# Patient Record
Sex: Female | Born: 1960 | Race: Black or African American | Hispanic: No | Marital: Married | Smoking: Never smoker
Health system: Southern US, Community
[De-identification: ages and names within clinical notes are randomized; demographics above are authoritative.]

## PROBLEM LIST (undated history)

## (undated) DIAGNOSIS — G4730 Sleep apnea, unspecified: Secondary | ICD-10-CM

## (undated) DIAGNOSIS — N184 Chronic kidney disease, stage 4 (severe): Secondary | ICD-10-CM

## (undated) DIAGNOSIS — J189 Pneumonia, unspecified organism: Secondary | ICD-10-CM

## (undated) DIAGNOSIS — E785 Hyperlipidemia, unspecified: Secondary | ICD-10-CM

## (undated) DIAGNOSIS — E1142 Type 2 diabetes mellitus with diabetic polyneuropathy: Secondary | ICD-10-CM

## (undated) DIAGNOSIS — R0609 Other forms of dyspnea: Secondary | ICD-10-CM

## (undated) DIAGNOSIS — E669 Obesity, unspecified: Secondary | ICD-10-CM

## (undated) DIAGNOSIS — Z8619 Personal history of other infectious and parasitic diseases: Secondary | ICD-10-CM

## (undated) DIAGNOSIS — M79673 Pain in unspecified foot: Secondary | ICD-10-CM

## (undated) DIAGNOSIS — E039 Hypothyroidism, unspecified: Secondary | ICD-10-CM

## (undated) DIAGNOSIS — R0600 Dyspnea, unspecified: Secondary | ICD-10-CM

## (undated) DIAGNOSIS — I739 Peripheral vascular disease, unspecified: Secondary | ICD-10-CM

## (undated) DIAGNOSIS — E119 Type 2 diabetes mellitus without complications: Secondary | ICD-10-CM

## (undated) DIAGNOSIS — R9430 Abnormal result of cardiovascular function study, unspecified: Secondary | ICD-10-CM

## (undated) DIAGNOSIS — T8610 Unspecified complication of kidney transplant: Secondary | ICD-10-CM

## (undated) DIAGNOSIS — K219 Gastro-esophageal reflux disease without esophagitis: Secondary | ICD-10-CM

## (undated) DIAGNOSIS — I10 Essential (primary) hypertension: Secondary | ICD-10-CM

## (undated) DIAGNOSIS — G629 Polyneuropathy, unspecified: Secondary | ICD-10-CM

## (undated) DIAGNOSIS — G43909 Migraine, unspecified, not intractable, without status migrainosus: Secondary | ICD-10-CM

## (undated) DIAGNOSIS — B029 Zoster without complications: Secondary | ICD-10-CM

## (undated) DIAGNOSIS — D649 Anemia, unspecified: Secondary | ICD-10-CM

## (undated) DIAGNOSIS — M1990 Unspecified osteoarthritis, unspecified site: Secondary | ICD-10-CM

## (undated) DIAGNOSIS — M109 Gout, unspecified: Secondary | ICD-10-CM

## (undated) DIAGNOSIS — Z992 Dependence on renal dialysis: Secondary | ICD-10-CM

## (undated) HISTORY — PX: COLONOSCOPY: SHX174

## (undated) HISTORY — DX: Type 2 diabetes mellitus without complications: E11.9

## (undated) HISTORY — DX: Zoster without complications: B02.9

## (undated) HISTORY — DX: Pain in unspecified foot: M79.673

## (undated) HISTORY — DX: Obesity, unspecified: E66.9

## (undated) HISTORY — DX: Hyperlipidemia, unspecified: E78.5

## (undated) HISTORY — DX: Anemia, unspecified: D64.9

## (undated) HISTORY — DX: Essential (primary) hypertension: I10

## (undated) HISTORY — DX: Dyspnea, unspecified: R06.00

## (undated) HISTORY — DX: Other forms of dyspnea: R06.09

## (undated) HISTORY — DX: Abnormal result of cardiovascular function study, unspecified: R94.30

## (undated) HISTORY — DX: Hypothyroidism, unspecified: E03.9

## (undated) HISTORY — DX: Chronic kidney disease, stage 4 (severe): N18.4

---

## 1979-06-04 HISTORY — PX: ENDOMETRIAL CRYOABLATION: SHX1499

## 1985-10-03 HISTORY — PX: TUBAL LIGATION: SHX77

## 1995-10-04 HISTORY — PX: VARICOSE VEIN SURGERY: SHX832

## 1998-10-03 HISTORY — PX: AV FISTULA PLACEMENT: SHX1204

## 1998-10-10 ENCOUNTER — Inpatient Hospital Stay (HOSPITAL_COMMUNITY): Admission: EM | Admit: 1998-10-10 | Discharge: 1998-10-13 | Payer: Self-pay | Admitting: Emergency Medicine

## 1998-10-11 ENCOUNTER — Encounter: Payer: Self-pay | Admitting: Family Medicine

## 1998-10-12 ENCOUNTER — Encounter: Payer: Self-pay | Admitting: Family Medicine

## 1999-11-16 ENCOUNTER — Other Ambulatory Visit: Admission: RE | Admit: 1999-11-16 | Discharge: 1999-11-16 | Payer: Self-pay | Admitting: Obstetrics and Gynecology

## 1999-11-16 ENCOUNTER — Encounter (INDEPENDENT_AMBULATORY_CARE_PROVIDER_SITE_OTHER): Payer: Self-pay | Admitting: Specialist

## 2000-01-28 ENCOUNTER — Emergency Department (HOSPITAL_COMMUNITY): Admission: EM | Admit: 2000-01-28 | Discharge: 2000-01-29 | Payer: Self-pay | Admitting: Emergency Medicine

## 2000-05-10 ENCOUNTER — Other Ambulatory Visit: Admission: RE | Admit: 2000-05-10 | Discharge: 2000-05-10 | Payer: Self-pay | Admitting: Obstetrics and Gynecology

## 2000-06-10 ENCOUNTER — Emergency Department (HOSPITAL_COMMUNITY): Admission: EM | Admit: 2000-06-10 | Discharge: 2000-06-10 | Payer: Self-pay | Admitting: Emergency Medicine

## 2000-11-02 ENCOUNTER — Other Ambulatory Visit: Admission: RE | Admit: 2000-11-02 | Discharge: 2000-11-02 | Payer: Self-pay | Admitting: Orthopedic Surgery

## 2000-11-28 ENCOUNTER — Other Ambulatory Visit: Admission: RE | Admit: 2000-11-28 | Discharge: 2000-11-28 | Payer: Self-pay | Admitting: Obstetrics and Gynecology

## 2001-05-16 ENCOUNTER — Other Ambulatory Visit: Admission: RE | Admit: 2001-05-16 | Discharge: 2001-05-16 | Payer: Self-pay | Admitting: Obstetrics and Gynecology

## 2001-11-12 ENCOUNTER — Ambulatory Visit (HOSPITAL_COMMUNITY): Admission: RE | Admit: 2001-11-12 | Discharge: 2001-11-12 | Payer: Self-pay | Admitting: Gastroenterology

## 2001-11-12 ENCOUNTER — Encounter (INDEPENDENT_AMBULATORY_CARE_PROVIDER_SITE_OTHER): Payer: Self-pay

## 2001-12-06 ENCOUNTER — Other Ambulatory Visit: Admission: RE | Admit: 2001-12-06 | Discharge: 2001-12-06 | Payer: Self-pay | Admitting: Obstetrics and Gynecology

## 2002-01-04 ENCOUNTER — Ambulatory Visit (HOSPITAL_COMMUNITY): Admission: RE | Admit: 2002-01-04 | Discharge: 2002-01-04 | Payer: Self-pay | Admitting: Obstetrics and Gynecology

## 2002-04-27 ENCOUNTER — Encounter: Payer: Self-pay | Admitting: Emergency Medicine

## 2002-04-27 ENCOUNTER — Emergency Department (HOSPITAL_COMMUNITY): Admission: EM | Admit: 2002-04-27 | Discharge: 2002-04-27 | Payer: Self-pay | Admitting: Emergency Medicine

## 2002-05-01 ENCOUNTER — Ambulatory Visit (HOSPITAL_COMMUNITY): Admission: RE | Admit: 2002-05-01 | Discharge: 2002-05-01 | Payer: Self-pay | Admitting: Family Medicine

## 2002-05-01 ENCOUNTER — Encounter: Payer: Self-pay | Admitting: Family Medicine

## 2002-05-12 ENCOUNTER — Encounter: Payer: Self-pay | Admitting: Sports Medicine

## 2002-05-12 ENCOUNTER — Encounter: Admission: RE | Admit: 2002-05-12 | Discharge: 2002-05-12 | Payer: Self-pay | Admitting: Sports Medicine

## 2002-11-09 ENCOUNTER — Emergency Department (HOSPITAL_COMMUNITY): Admission: EM | Admit: 2002-11-09 | Discharge: 2002-11-09 | Payer: Self-pay | Admitting: Emergency Medicine

## 2002-12-30 ENCOUNTER — Emergency Department (HOSPITAL_COMMUNITY): Admission: EM | Admit: 2002-12-30 | Discharge: 2002-12-30 | Payer: Self-pay | Admitting: Emergency Medicine

## 2003-01-30 ENCOUNTER — Other Ambulatory Visit: Admission: RE | Admit: 2003-01-30 | Discharge: 2003-01-30 | Payer: Self-pay | Admitting: Obstetrics and Gynecology

## 2004-06-19 ENCOUNTER — Emergency Department (HOSPITAL_COMMUNITY): Admission: EM | Admit: 2004-06-19 | Discharge: 2004-06-19 | Payer: Self-pay | Admitting: Emergency Medicine

## 2004-11-15 ENCOUNTER — Other Ambulatory Visit: Admission: RE | Admit: 2004-11-15 | Discharge: 2004-11-15 | Payer: Self-pay | Admitting: Obstetrics and Gynecology

## 2005-08-15 ENCOUNTER — Emergency Department (HOSPITAL_COMMUNITY): Admission: EM | Admit: 2005-08-15 | Discharge: 2005-08-15 | Payer: Self-pay | Admitting: Emergency Medicine

## 2007-09-01 ENCOUNTER — Emergency Department (HOSPITAL_COMMUNITY): Admission: EM | Admit: 2007-09-01 | Discharge: 2007-09-01 | Payer: Self-pay | Admitting: *Deleted

## 2008-03-21 ENCOUNTER — Encounter: Admission: RE | Admit: 2008-03-21 | Discharge: 2008-03-21 | Payer: Self-pay | Admitting: Nephrology

## 2008-10-03 HISTORY — PX: AV FISTULA PLACEMENT, RADIOCEPHALIC: SHX1208

## 2008-12-16 ENCOUNTER — Ambulatory Visit (HOSPITAL_COMMUNITY): Admission: RE | Admit: 2008-12-16 | Discharge: 2008-12-16 | Payer: Self-pay | Admitting: Nephrology

## 2008-12-16 ENCOUNTER — Encounter (INDEPENDENT_AMBULATORY_CARE_PROVIDER_SITE_OTHER): Payer: Self-pay | Admitting: Nephrology

## 2008-12-16 ENCOUNTER — Ambulatory Visit: Payer: Self-pay | Admitting: Surgery

## 2009-04-23 ENCOUNTER — Ambulatory Visit: Payer: Self-pay | Admitting: *Deleted

## 2009-04-30 ENCOUNTER — Ambulatory Visit (HOSPITAL_COMMUNITY): Admission: RE | Admit: 2009-04-30 | Discharge: 2009-04-30 | Payer: Self-pay | Admitting: Vascular Surgery

## 2009-04-30 ENCOUNTER — Ambulatory Visit: Payer: Self-pay | Admitting: Vascular Surgery

## 2009-06-29 ENCOUNTER — Encounter: Payer: Self-pay | Admitting: Cardiology

## 2009-09-22 ENCOUNTER — Encounter (INDEPENDENT_AMBULATORY_CARE_PROVIDER_SITE_OTHER): Payer: Self-pay | Admitting: Surgery

## 2009-09-22 ENCOUNTER — Ambulatory Visit (HOSPITAL_COMMUNITY): Admission: RE | Admit: 2009-09-22 | Discharge: 2009-09-23 | Payer: Self-pay | Admitting: Surgery

## 2009-10-03 HISTORY — PX: CHOLECYSTECTOMY: SHX55

## 2009-10-03 HISTORY — PX: CATARACT EXTRACTION W/ INTRAOCULAR LENS  IMPLANT, BILATERAL: SHX1307

## 2009-10-03 HISTORY — PX: INTRAUTERINE DEVICE (IUD) INSERTION: SHX5877

## 2009-11-06 ENCOUNTER — Encounter: Payer: Self-pay | Admitting: Cardiology

## 2009-11-20 ENCOUNTER — Ambulatory Visit: Payer: Self-pay | Admitting: Cardiology

## 2009-11-20 DIAGNOSIS — R9430 Abnormal result of cardiovascular function study, unspecified: Secondary | ICD-10-CM | POA: Insufficient documentation

## 2009-11-20 DIAGNOSIS — R0989 Other specified symptoms and signs involving the circulatory and respiratory systems: Secondary | ICD-10-CM | POA: Insufficient documentation

## 2009-11-20 DIAGNOSIS — N184 Chronic kidney disease, stage 4 (severe): Secondary | ICD-10-CM | POA: Insufficient documentation

## 2009-11-20 DIAGNOSIS — I10 Essential (primary) hypertension: Secondary | ICD-10-CM | POA: Insufficient documentation

## 2009-11-20 DIAGNOSIS — R0609 Other forms of dyspnea: Secondary | ICD-10-CM | POA: Insufficient documentation

## 2009-11-26 ENCOUNTER — Ambulatory Visit: Payer: Self-pay | Admitting: Cardiology

## 2009-11-26 ENCOUNTER — Inpatient Hospital Stay (HOSPITAL_COMMUNITY): Admission: AD | Admit: 2009-11-26 | Discharge: 2009-11-28 | Payer: Self-pay | Admitting: Cardiology

## 2009-12-21 DIAGNOSIS — R0602 Shortness of breath: Secondary | ICD-10-CM | POA: Insufficient documentation

## 2009-12-21 DIAGNOSIS — E039 Hypothyroidism, unspecified: Secondary | ICD-10-CM | POA: Insufficient documentation

## 2009-12-21 DIAGNOSIS — E669 Obesity, unspecified: Secondary | ICD-10-CM | POA: Insufficient documentation

## 2009-12-21 DIAGNOSIS — E1129 Type 2 diabetes mellitus with other diabetic kidney complication: Secondary | ICD-10-CM | POA: Insufficient documentation

## 2009-12-21 DIAGNOSIS — E785 Hyperlipidemia, unspecified: Secondary | ICD-10-CM | POA: Insufficient documentation

## 2009-12-21 DIAGNOSIS — Z87898 Personal history of other specified conditions: Secondary | ICD-10-CM | POA: Insufficient documentation

## 2009-12-21 DIAGNOSIS — B029 Zoster without complications: Secondary | ICD-10-CM | POA: Insufficient documentation

## 2009-12-31 ENCOUNTER — Encounter (INDEPENDENT_AMBULATORY_CARE_PROVIDER_SITE_OTHER): Payer: Self-pay | Admitting: *Deleted

## 2010-07-29 ENCOUNTER — Ambulatory Visit: Payer: Self-pay | Admitting: Oncology

## 2010-08-03 LAB — CBC WITH DIFFERENTIAL/PLATELET
BASO%: 0.4 % (ref 0.0–2.0)
Basophils Absolute: 0.1 10*3/uL (ref 0.0–0.1)
EOS%: 1.7 % (ref 0.0–7.0)
Eosinophils Absolute: 0.2 10*3/uL (ref 0.0–0.5)
HCT: 30.7 % — ABNORMAL LOW (ref 34.8–46.6)
HGB: 10.5 g/dL — ABNORMAL LOW (ref 11.6–15.9)
LYMPH%: 19 % (ref 14.0–49.7)
MCH: 28.9 pg (ref 25.1–34.0)
MCHC: 34.2 g/dL (ref 31.5–36.0)
MCV: 84.6 fL (ref 79.5–101.0)
MONO#: 0.4 10*3/uL (ref 0.1–0.9)
MONO%: 3.4 % (ref 0.0–14.0)
NEUT#: 9.8 10*3/uL — ABNORMAL HIGH (ref 1.5–6.5)
NEUT%: 75.5 % (ref 38.4–76.8)
Platelets: 277 10*3/uL (ref 145–400)
RBC: 3.63 10*6/uL — ABNORMAL LOW (ref 3.70–5.45)
RDW: 13.4 % (ref 11.2–14.5)
WBC: 13 10*3/uL — ABNORMAL HIGH (ref 3.9–10.3)
lymph#: 2.5 10*3/uL (ref 0.9–3.3)

## 2010-08-03 LAB — COMPREHENSIVE METABOLIC PANEL
ALT: 11 U/L (ref 0–35)
AST: 11 U/L (ref 0–37)
Albumin: 3.8 g/dL (ref 3.5–5.2)
Alkaline Phosphatase: 98 U/L (ref 39–117)
BUN: 39 mg/dL — ABNORMAL HIGH (ref 6–23)
CO2: 22 mEq/L (ref 19–32)
Calcium: 9.1 mg/dL (ref 8.4–10.5)
Chloride: 106 mEq/L (ref 96–112)
Creatinine, Ser: 3.11 mg/dL — ABNORMAL HIGH (ref 0.40–1.20)
Glucose, Bld: 165 mg/dL — ABNORMAL HIGH (ref 70–99)
Potassium: 4.2 mEq/L (ref 3.5–5.3)
Sodium: 138 mEq/L (ref 135–145)
Total Bilirubin: 0.3 mg/dL (ref 0.3–1.2)
Total Protein: 7.1 g/dL (ref 6.0–8.3)

## 2010-08-03 LAB — LACTATE DEHYDROGENASE: LDH: 142 U/L (ref 94–250)

## 2010-08-03 LAB — TECHNOLOGIST REVIEW

## 2010-08-03 LAB — CHCC SMEAR

## 2010-08-06 LAB — BCR/ABL (LIO MMD)

## 2010-09-23 ENCOUNTER — Ambulatory Visit: Payer: Self-pay | Admitting: Vascular Surgery

## 2010-10-29 NOTE — Assessment & Plan Note (Signed)
OFFICE VISIT  Wanda [name], Wanda [name] DOB:  1960-12-22                                       10/28/2010 [id number]  CHIEF COMPLAINT: 1. Foot pain. 2. Nonmaturing right arm arteriovenous fistula.  HISTORY OF PRESENT ILLNESS:  The patient is a 50 year old female referred by Dr. Edrick [name] for evaluation of lower extremity pain.  The patient states she develops a pins and needles sensation in both feet that mainly occur at night time.  She states that this has been going on for approximately 6 months with no real significant change.  She has not really been on any neuropathy meds in the past.  She states that she also has similar findings in her hands, but it is more numbness and tingling in the hands rather than the pins and needles sensation.  She denies any symptoms of claudication.  She has previously had some portion of veins stripped out of her right leg for varicose veins.  She denies any nonhealing ulcerations on her feet.  Additionally, she is referred today for a nonmaturing AV fistula.  She had a right radiocephalic AV fistula placed in July 2010.  Review of the operative note shows that the artery was only 1 mm in diameter and fairly small.  She currently is not on dialysis but she was recently evaluated by Dr. Justin [name] and it was thought that the fistula probably would not be usable due to its small size.  CHRONIC MEDICAL PROBLEMS:  Chronic kidney disease as well as hypertension, hyperlipidemia, and anemia.  These are all currently controlled and followed by Dr. Justin [name].  PAST SURGICAL HISTORY:  AV fistula, cardiac catheterization for transplant evaluation, cholecystectomy, C-section x2, vein stripping.  SOCIAL HISTORY:  She is married, has 3 children.  She works for [employer].  She is a nonsmoker and nonconsumer of alcohol.  FAMILY HISTORY:  Not remarkable for any vascular disease at age less than 26.  REVIEW OF SYSTEMS:   She has had some recent weight gain.  She is 5 feet 9, 263 pounds. VASCULAR:  As mentioned above. URINARY:  She has chronic kidney disease as mentioned above. GI:  She has intermittent problems with constipation. All other systems on her 12-point review of systems were negative.  MEDICATIONS: 1. Lipitor 40 mg once a day. 2. Furosemide 80 mg once a day. 3. Ergocalciferol 50,000 units once a month. 4. Coreg 25 mg twice a day. 5. Synthroid 0.25 mcg once a day. 6. B12 injection once monthly. 7. __________ once daily. 8. Novolin insulin 65 units b.i.[name]. 9. Amlodipine 10 mg q.h.s. 10.Spirolactone 50 mg once a day. 11.MiraLax twice a day. 12.Aspirin 81 mg once a day.  ALLERGIES:  She has allergies listed to Vicodin and Darvocet.  PHYSICAL EXAM:  Blood pressure is 136/86, heart rate is 96 regular. Temperature is 97.9.  HEENT:  Unremarkable.  Neck:  2+ carotid pulses without bruit.  Chest:  Clear to auscultation.  Cardiac exam:  Regular rate and rhythm without murmur.  Abdomen:  Obese, soft, nontender, nondistended.  No masses.  Musculoskeletal exam:  She has no major obvious joint deformities.  Neurologic exam:  She has symmetric upper extremity and lower extremity motor strength which is 5/5.  Sensation is decreased in a glove-stocking distribution bilaterally.  Skin:  No open ulcers or rashes.  Peripheral vascular exam:  She has a 2+ radial pulse bilaterally.  She has 2+ femoral pulse bilaterally.  She has 2+ right posterior tibial pulse.  She has a 1+ left dorsalis pedis pulse.  She has an absent dorsalis pedis pulse on the right.  She has an absent posterior tibial pulse on the left.  She has an easily palpable thrill in the right radiocephalic AV fistula, but this is small in diameter.  She had bilateral ABIs performed today which showed triphasic waveforms and normal ABIs, 1.21 on the left and 0.17 on the right.  She also had a duplex ultrasound of her radiocephalic AV fistula  which showed that the anastomosis was patent.  However, the fistula was still small, 3 mm in diameter throughout most of its course.  In summary, as far as [name]'s leg symptoms are concerned, I agree with Dr. Jason [name] assessment that this seems more like peripheral neuropathy and PAD especially in light of the fact that she has palpable pulses and normal ABIs.  To treat this, I have placed her on Neurontin 100 mg once a day today.  This dose may need to be titrated up over time.  I will leave this at Dr. Jason [name] discretion.  As far as her right radiocephalic AV fistula is concerned, I do not believe this is going to mature long-term and it was a fairly small artery.  I believe the best option at this time would be to revise this to a right brachiocephalic fistula and possible ligation of her radiocephalic fistula if she has any steal symptoms after that.  Her new fistula will be placed on November 03, 2010.  Risks, benefits, possible complications, and procedure details were explained to the patient including but not limited to bleeding, infection, nonmaturation of the fistula, possible requirement of superficialization of the fistula at some point in the future.    Wanda Oto. Zakhai Meisinger, MD Electronically Signed  CEF/MEDQ  [name]:  10/29/2010  T:  10/29/2010  Job:  [id number]  cc:   Wanda [name], M.[name].

## 2010-11-02 NOTE — Cardiovascular Report (Signed)
Summary: Pre Cath Orders  Pre Cath Orders   Imported By: Sallee Provencal 12/01/2009 15:30:46  _____________________________________________________________________  External Attachment:    Type:   Image     Comment:   External Document

## 2010-11-02 NOTE — Assessment & Plan Note (Signed)
Summary: np6/ work up for transplant, abn stress test, eval for cath, ...   Visit Type:  new pt visit Referring Provider:  Edrick Oh, MD  CC:  pt is having kindey transplant says she had an abnormal stress test..edema/feet.  History of Present Illness: 50 yo with history of CKD stage IV, HTN, and diabetes undergoing evaluation for renal transplant at [facility] was found to have evidence for ischemia on pre-transplant stress myoview.  She had a 5% reversible anteroseptal defect and a 5% reversible inferior defect.  She denies any chest pain but does have significant exertional dyspnea.  She is short of breath after walking 50 feet or after climbing a flight of steps.  She is short of breath going up an incline.  No orthopnea or PND.  No episodes of syncope or presyncope.  BP is 172/94 and rechecked 156/96.  She has not taken any of her BP meds yet today.    ECG: NSR, LVH  Labs (9/10): HCT 34.8, K 5.1, creatinine 3.65  Current Medications (verified): 1)  Norvasc 10 Mg Tabs (Amlodipine Besylate) .[name] Kitchen.. 1 Tab Once Daily 2)  Spironolactone 50 Mg Tabs (Spironolactone) .[name] Kitchen.. 1 Tab Once Daily 3)  Synthroid 50 Mcg Tabs (Levothyroxine Sodium) .[name] Kitchen.. 1 Tab Once Daily 4)  Coreg 25 Mg Tabs (Carvedilol) .[name] Kitchen.. 1 Tab Two Times A Day 5)  Lasix 80 Mg Tabs (Furosemide) .[name] Kitchen.. 1 Tab Once Daily 6)  Lipitor 40 Mg Tabs (Atorvastatin Calcium) .[name] Kitchen.. 1 Tab Once Daily 7)  Nph Insulin .[name] Kitchen.. 60u Two Times A Day 8)  Aspir-Low 81 Mg Tbec (Aspirin) .... One Daily 9)  Acetadote 200 Mg/ml Soln (Acetylcysteine) .... Take 1200mg  Twice A Day Starting The Morning of 11-26-09 For 4 Doses  Allergies (verified): 1)  ! Vicodin 2)  ! Darvocet  Past History:  Past Medical History: 1. 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  Being worked up for renal transplant at [facility] in [place]. 2. HTN 3. Hyperlipidemia 4. Type II diabetes x 19 years 5. Hypothyroidism 6. Obesity: used to weigh over 400 lbs, used Fen-phen in the past.  7.  Migraines 8. Exertional dyspnea: Echo (2/11) showed EF 55-60%, normal RV, trivial MR.  No Fen-phen type valvular pathology.  Lexiscan myoview (2/11): EF 49%, small (5%) reversible anteroseptal defect.  Small (5%) reversible inferior defect.   Family History: Father died at 52 from colon cancer.  No premature CAD.   Social History: Married with 2 kids.  Drives schoolbus.  Never smoked, no ETOH.   Review of Systems       All systems reviewed and negative except as per HPI.   Vital Signs:  Patient profile:   50 year old female Height:      70 inches Weight:      257 pounds BMI:     37.01 Pulse rate:   90 / minute Pulse rhythm:   irregular BP sitting:   172 / 94  (left arm) Cuff size:   large  Vitals Entered By: Julaine Hua, CMA (November 20, 2009 10:49 AM)  Physical Exam  General:  Well developed, well nourished, in no acute distress. Obese.  Head:  normocephalic and atraumatic Nose:  no deformity, discharge, inflammation, or lesions Mouth:  Teeth, gums and palate normal. Oral mucosa normal. Neck:  Neck supple, no JVD. No masses, thyromegaly or abnormal cervical nodes. Lungs:  Clear bilaterally to auscultation and percussion. Heart:  Non-displaced PMI, chest non-tender; regular rate and rhythm, S1, S2 without murmurs,  rubs or gallops. Carotid upstroke normal, no bruit. Pedals normal pulses. Trace edema.  Abdomen:  Bowel sounds positive; abdomen soft and non-tender without masses, organomegaly, or hernias noted. No hepatosplenomegaly. Msk:  Back normal, normal gait. Muscle strength and tone normal. Extremities:  No clubbing or cyanosis. Neurologic:  Alert and oriented x 3. Skin:  Intact without lesions or rashes. Psych:  Normal affect.   Impression & Recommendations:  Problem # 1:  NONSPECIFIC ABNORMAL UNSPEC CV FUNCTION STUDY (ICD-794.30) Myoview done pre-transplant at [facility] showed a small reversible anteroseptal defect and a small reversible inferior defect. She does have  risk factors for CAD: long-standing diabetes, HTN, and hyperlipidemia. Severe CKD also puts her at risk for developing CAD.  At worst, she could have multivessel disease based on reversible changes in 2 distributions.  She has CKD stage IV so is at high risk of progression of renal disease with contrast.  However, I think that prior to renal transplant surgery, we need to work out whether or not she has significant CAD.  I have spoken with Dr. Justin Mend regarding this situation and will plan on admitting her for IV sodium bicarbonate and acetylcysteine the evening before catheterization.  We will do a cardiac catheterization attempting to use minimal contrast.  I have informed her of the risk of worsening renal function and need for dialysis.  She will start ASA 81 mg daily.    Problem # 2:  DYSPNEA ON EXERTION (ICD-786.09) Most likely this represents diastolic CHF in the setting of HTN and severe CKD with volume retention.  She is on Lasix 80 mg daily and appears relatively euvolemic on exam.  However, given the abnormal myoview, there must be concern for ischemic diastolic dysfunction.    Problem # 3:  HYPERTENSION, UNSPECIFIED (ICD-401.9) BP is high today but patient has not taken any of her meds.  She will take her BP meds when she gets home.   Other Orders: Cardiac Catheterization (Cardiac Cath)  Patient Instructions: 1)  Your physician has recommended you make the following change in your medication:  2)  Start Aspirin 81mg  daily 3)  Your physician has requested that you have a cardiac catheterization.  Cardiac catheterization is used to diagnose and/or treat various heart conditions. Doctors may recommend this procedure for a number of different reasons. The most common reason is to evaluate chest pain. Chest pain can be a symptom of coronary artery disease (CAD), and cardiac catheterization can show whether plaque is narrowing or blocking your heart's arteries. This procedure is also used to  evaluate the valves, as well as measure the blood flow and oxygen levels in different parts of your heart.  For further information please visit [email or web address][email or web address].  Please follow instruction sheet, as given. Prescriptions: ACETADOTE 200 MG/ML SOLN (ACETYLCYSTEINE) TAKE 1200MG  TWICE A DAY STARTING THE MORNING OF 11-26-09 FOR 4 DOSES  #3 x 0   Entered by:   Desiree Lucy, RN, BSN   Authorized by:   Loralie Champagne, MD   Signed by:   Desiree Lucy, RN, BSN on 11/20/2009   Method used:   Electronically to        [facility] [address] [phone]* (retail)       [address]       [place], [place]  [address]       Ph: [phone] or [phone]       Fax: [phone]   RxID:   [id number]

## 2010-11-02 NOTE — Letter (Signed)
Summary: Cardiac Catheterization Instructions- Main Lab  [facility], [place]  [id number] [place]   [place], [place] [address]   Phone: [phone]  Fax: [phone]     11/20/2009 MRN: [id number]  [facility] [place], [place]  [address]  Dear Ms. MI,   You are scheduled for Cardiac Catheterization on Friday February 25,2011              with Dr. Loralie Champagne .            Please arrive at the [place] [facility] at 6 pm       on the day before your procedure.     MAKE SURE YOU TAKE YOUR ASPIRIN.         YOU MAY TAKE ALL of your  medications .            __x__ Pre-med instructions:            Take Mucomyst 1200mg   starting Thursday morning February 24,2011--Take 1200mg  Thursday morning,Thursday evening, and Friday evening after you have the cathetherization.    Plan for one night stay - bring personal belongings (i.e. toothpaste, toothbrush, etc.)   Bring a current list of your medications and current insurance cards.   Must have a responsible person to drive you home.    Someone must be with you for the first 24 hours after you arrive home.   Please wear clothes that are easy to get on and off and wear slip-on shoes.  *Special note: Every effort is made to have your procedure done on time.  Occasionally there are emergencies that present themselves at the hospital that may cause delays.  Please be patient if a delay does occur.  If you have any questions after you get home, please call the office at the number listed above.  Desiree Lucy, RN, BSN

## 2010-11-02 NOTE — Letter (Signed)
Summary: Appointment - Missed  [facility], [place]  [address] [place]   [place], [place] [address]   Phone: [phone]  Fax: [phone]     December 31, 2009 MRN: [id number]   [facility] [place], [place]  [address]   Dear Ms. Wanda [name],  Our records indicate you missed your appointment on  12/22/2009 with Dr. Aundra Dubin. It is very important that we reach you to reschedule this appointment. We look forward to participating in your health care needs. Please contact us at the number listed above at your earliest convenience to reschedule this appointment.     Sincerely,   Darnell Level [facility] Scheduling Team

## 2010-11-03 ENCOUNTER — Other Ambulatory Visit: Payer: Self-pay | Admitting: Vascular Surgery

## 2010-11-03 ENCOUNTER — Ambulatory Visit (HOSPITAL_COMMUNITY)
Admission: RE | Admit: 2010-11-03 | Discharge: 2010-11-03 | Disposition: A | Discharge status: Home / Self Care | Payer: 59 | Attending: Vascular Surgery | Admitting: Vascular Surgery

## 2010-11-03 ENCOUNTER — Encounter (HOSPITAL_COMMUNITY)
Admission: RE | Admit: 2010-11-03 | Discharge: 2010-11-03 | Disposition: A | Discharge status: Home / Self Care | Payer: 59 | Source: Ambulatory Visit | Attending: Vascular Surgery | Admitting: Vascular Surgery

## 2010-11-03 DIAGNOSIS — I120 Hypertensive chronic kidney disease with stage 5 chronic kidney disease or end stage renal disease: Secondary | ICD-10-CM | POA: Insufficient documentation

## 2010-11-03 DIAGNOSIS — N186 End stage renal disease: Secondary | ICD-10-CM

## 2010-11-03 DIAGNOSIS — Z01818 Encounter for other preprocedural examination: Secondary | ICD-10-CM | POA: Insufficient documentation

## 2010-11-03 DIAGNOSIS — E119 Type 2 diabetes mellitus without complications: Secondary | ICD-10-CM | POA: Insufficient documentation

## 2010-11-03 DIAGNOSIS — Z01811 Encounter for preprocedural respiratory examination: Secondary | ICD-10-CM

## 2010-11-03 DIAGNOSIS — E039 Hypothyroidism, unspecified: Secondary | ICD-10-CM | POA: Insufficient documentation

## 2010-11-03 LAB — POCT I-STAT 4, (NA,K, GLUC, HGB,HCT)
Glucose, Bld: 160 mg/dL — ABNORMAL HIGH (ref 70–99)
HCT: 33 % — ABNORMAL LOW (ref 36.0–46.0)
Hemoglobin: 11.2 g/dL — ABNORMAL LOW (ref 12.0–15.0)
Potassium: 4.2 mEq/L (ref 3.5–5.1)
Sodium: 139 mEq/L (ref 135–145)

## 2010-11-03 LAB — SURGICAL PCR SCREEN
MRSA, PCR: NEGATIVE
Staphylococcus aureus: NEGATIVE

## 2010-11-03 LAB — GLUCOSE, CAPILLARY: Glucose-Capillary: 156 mg/dL — ABNORMAL HIGH (ref 70–99)

## 2010-11-05 NOTE — Procedures (Unsigned)
VASCULAR LAB EXAM  INDICATION:  Nonmaturing AV fistula.  HISTORY: Diabetes:  Yes Cardiac:  No Hypertension:  Yes  EXAM:  Right radiocephalic fistula for dialysis access.  IMPRESSION:  Patent arteriovenous fistula with diameters and velocities as described on attached worksheet.  ___________________________________________ Jessy Oto. Fields, MD  LT/MEDQ  D:  10/28/2010  T:  10/28/2010  Job:  [id number]

## 2010-11-06 NOTE — Op Note (Signed)
  NAMECOTRINA, DANZEY             ACCOUNT NO.:  [id number]  MEDICAL RECORD NO.:  [id number]           PATIENT TYPE:  O  LOCATION:  XRAY                         FACILITY:  [facility]  PHYSICIAN:  Jessy Oto. Lestine Rahe, MD  DATE OF BIRTH:  1961-02-15  DATE OF PROCEDURE:  11/03/2010 DATE OF DISCHARGE:                              OPERATIVE REPORT   PROCEDURE:  Right brachiocephalic arteriovenous fistula.  PREOPERATIVE DIAGNOSIS:  End-stage renal disease.  POSTOPERATIVE DIAGNOSIS:  End-stage renal disease.  ANESTHESIA:  Local with IV sedation.  ASSISTANT:  Leta Baptist, PA-C  OPERATIVE FINDINGS:  [id number] mm right cephalic vein.  OPERATIVE DETAILS:  After obtaining informed consent, the patient was taken to the operating room.  The patient was placed in the supine position on the operating table.  After adequate sedation, the patient's entire right upper extremity was prepped and draped in usual sterile fashion.  Local anesthesia was infiltrated near the right antecubital crease.  Transverse incision was made in this location and carried down through subcutaneous tissues down the level of the cephalic vein. Cephalic vein was of good quality approximately 3-3.5 mm in diameter. This was dissected free circumferentially, and small side branches ligated and divided between silk ties or clips.  Next, brachial artery was dissected free in the medial portion incision.  The brachial artery was fairly small between 2.5-3 mm in diameter, but there was spasm within it.  Small side branches ligated and divided between silk ties or clips.  The patient was given 5000 units of intravenous heparin.  Distal cephalic vein was ligated with 2-0 silk ties.  The vein was then transected and swung over the level of the artery.  The vein was gently distended with heparinized saline.  Longitudinal opening was made in the brachial artery after proximal and distal control was obtained with vessel loops.  Vein  was then sewn end of vein to side of artery using a running 7-0 Prolene suture.  Just prior to completion of anastomosis, there was forebled, backbled, and thoroughly flushed.  Anastomosis was secured.  Vessel loops were released.  There was palpable thrill in the fistula immediately.  Hemostasis was obtained.  Subcutaneous tissues were reapproximated with running 3-0 Vicryl suture.  Skin was closed with 4-0 Vicryl subcuticular stitch.  The patient tolerated the procedure well, and there were no complications.  Instrument, sponge, and needle counts were correct at the end of the case.  The patient was taken to the recovery room in stable condition.     Jessy Oto. Lenell Lama, MD     CEF/MEDQ  D:  11/03/2010  T:  11/04/2010  Job:  [id number]  Electronically Signed by Ruta Hinds MD on 11/05/2010 01:13:53 PM

## 2010-12-02 ENCOUNTER — Ambulatory Visit (INDEPENDENT_AMBULATORY_CARE_PROVIDER_SITE_OTHER): Payer: 59 | Admitting: Vascular Surgery

## 2010-12-02 ENCOUNTER — Ambulatory Visit: Payer: Self-pay | Admitting: Vascular Surgery

## 2010-12-02 DIAGNOSIS — N186 End stage renal disease: Secondary | ICD-10-CM

## 2010-12-03 NOTE — Assessment & Plan Note (Signed)
OFFICE VISIT  Wanda [name], Wanda [name] DOB:  January 16, 1961                                       12/02/2010 [id number]  The patient returns for followup today.  She had a right brachiocephalic AV fistula placed on February 1.  She denies any fever, chills.  She states the wound is well-healed.  She has no signs and symptoms of steal in the right hand, specifically no numbness, tingling or aching in the hand.  PHYSICAL EXAM:  Blood pressure is 132/84 in the left arm, temperature is 98.2, heart rate is 101 and regular.  There is an easily palpable thrill in the right upper arm AV fistula.  The antecubital incision is well- healed.  The right hand is warm and well-perfused.  There is normal neurologic strength in the right hand.  The fistula is fairly deep on palpation.  ASSESSMENT:  Patent right brachiocephalic arteriovenous fistula although fairly deep at this point.  We will have her exercise this for the next 3 months and bring her back for a duplex ultrasound.  If the fistula has not matured or does not have proper depth and quality at that point we will consider superficialization or other intervention to make the fistula usable.    Jessy Oto. Fields, MD Electronically Signed  CEF/MEDQ  D:  12/02/2010  T:  12/03/2010  Job:  [id number]  cc:   Sherril Croon, M.D.

## 2010-12-07 ENCOUNTER — Ambulatory Visit: Payer: Self-pay | Admitting: *Deleted

## 2010-12-22 LAB — CBC
HCT: 35 % — ABNORMAL LOW (ref 36.0–46.0)
Hemoglobin: 12.1 g/dL (ref 12.0–15.0)
MCHC: 34.6 g/dL (ref 30.0–36.0)
MCV: 85.9 fL (ref 78.0–100.0)
Platelets: 237 10*3/uL (ref 150–400)
RBC: 4.07 MIL/uL (ref 3.87–5.11)
RDW: 13.2 % (ref 11.5–15.5)
WBC: 15.5 10*3/uL — ABNORMAL HIGH (ref 4.0–10.5)

## 2010-12-22 LAB — PROTIME-INR
INR: 1.01 (ref 0.00–1.49)
Prothrombin Time: 13.2 seconds (ref 11.6–15.2)

## 2010-12-22 LAB — BASIC METABOLIC PANEL
BUN: 39 mg/dL — ABNORMAL HIGH (ref 6–23)
BUN: 48 mg/dL — ABNORMAL HIGH (ref 6–23)
CO2: 22 mEq/L (ref 19–32)
CO2: 24 mEq/L (ref 19–32)
Calcium: 8.7 mg/dL (ref 8.4–10.5)
Calcium: 9 mg/dL (ref 8.4–10.5)
Chloride: 103 mEq/L (ref 96–112)
Chloride: 104 mEq/L (ref 96–112)
Creatinine, Ser: 2.93 mg/dL — ABNORMAL HIGH (ref 0.4–1.2)
Creatinine, Ser: 2.97 mg/dL — ABNORMAL HIGH (ref 0.4–1.2)
GFR calc Af Amer: 20 mL/min — ABNORMAL LOW (ref >60)
GFR calc Af Amer: 21 mL/min — ABNORMAL LOW (ref >60)
GFR calc non Af Amer: 17 mL/min — ABNORMAL LOW (ref >60)
GFR calc non Af Amer: 17 mL/min — ABNORMAL LOW (ref >60)
Glucose, Bld: 285 mg/dL — ABNORMAL HIGH (ref 70–99)
Glucose, Bld: 319 mg/dL — ABNORMAL HIGH (ref 70–99)
Potassium: 4.5 mEq/L (ref 3.5–5.1)
Potassium: 4.8 mEq/L (ref 3.5–5.1)
Sodium: 134 mEq/L — ABNORMAL LOW (ref 135–145)
Sodium: 135 mEq/L (ref 135–145)

## 2010-12-22 LAB — GLUCOSE, CAPILLARY
Glucose-Capillary: 237 mg/dL — ABNORMAL HIGH (ref 70–99)
Glucose-Capillary: 242 mg/dL — ABNORMAL HIGH (ref 70–99)
Glucose-Capillary: 261 mg/dL — ABNORMAL HIGH (ref 70–99)
Glucose-Capillary: 268 mg/dL — ABNORMAL HIGH (ref 70–99)
Glucose-Capillary: 277 mg/dL — ABNORMAL HIGH (ref 70–99)
Glucose-Capillary: 332 mg/dL — ABNORMAL HIGH (ref 70–99)
Glucose-Capillary: 391 mg/dL — ABNORMAL HIGH (ref 70–99)
Glucose-Capillary: 418 mg/dL — ABNORMAL HIGH (ref 70–99)
Glucose-Capillary: 97 mg/dL (ref 70–99)

## 2010-12-22 LAB — APTT: aPTT: 34 seconds (ref 24–37)

## 2010-12-28 ENCOUNTER — Encounter: Payer: 59 | Attending: Nephrology | Admitting: *Deleted

## 2010-12-28 DIAGNOSIS — E119 Type 2 diabetes mellitus without complications: Secondary | ICD-10-CM | POA: Insufficient documentation

## 2010-12-28 DIAGNOSIS — Z713 Dietary counseling and surveillance: Secondary | ICD-10-CM | POA: Insufficient documentation

## 2010-12-28 DIAGNOSIS — E669 Obesity, unspecified: Secondary | ICD-10-CM | POA: Insufficient documentation

## 2010-12-28 DIAGNOSIS — N289 Disorder of kidney and ureter, unspecified: Secondary | ICD-10-CM | POA: Insufficient documentation

## 2011-01-03 LAB — BASIC METABOLIC PANEL
BUN: 30 mg/dL — ABNORMAL HIGH (ref 6–23)
BUN: 40 mg/dL — ABNORMAL HIGH (ref 6–23)
CO2: 19 mEq/L (ref 19–32)
CO2: 22 mEq/L (ref 19–32)
Calcium: 8.4 mg/dL (ref 8.4–10.5)
Calcium: 8.6 mg/dL (ref 8.4–10.5)
Chloride: 108 mEq/L (ref 96–112)
Chloride: 110 mEq/L (ref 96–112)
Creatinine, Ser: 2.51 mg/dL — ABNORMAL HIGH (ref 0.4–1.2)
Creatinine, Ser: 3.02 mg/dL — ABNORMAL HIGH (ref 0.4–1.2)
GFR calc Af Amer: 20 mL/min — ABNORMAL LOW (ref >60)
GFR calc Af Amer: 25 mL/min — ABNORMAL LOW (ref >60)
GFR calc non Af Amer: 16 mL/min — ABNORMAL LOW (ref >60)
GFR calc non Af Amer: 20 mL/min — ABNORMAL LOW (ref >60)
Glucose, Bld: 172 mg/dL — ABNORMAL HIGH (ref 70–99)
Glucose, Bld: 95 mg/dL (ref 70–99)
Potassium: 4.8 mEq/L (ref 3.5–5.1)
Potassium: 5.1 mEq/L (ref 3.5–5.1)
Sodium: 135 mEq/L (ref 135–145)
Sodium: 135 mEq/L (ref 135–145)

## 2011-01-03 LAB — CBC
HCT: 32.2 % — ABNORMAL LOW (ref 36.0–46.0)
Hemoglobin: 11.2 g/dL — ABNORMAL LOW (ref 12.0–15.0)
MCHC: 34.8 g/dL (ref 30.0–36.0)
MCV: 85.1 fL (ref 78.0–100.0)
Platelets: 248 10*3/uL (ref 150–400)
RBC: 3.79 MIL/uL — ABNORMAL LOW (ref 3.87–5.11)
RDW: 12.9 % (ref 11.5–15.5)
WBC: 14.6 10*3/uL — ABNORMAL HIGH (ref 4.0–10.5)

## 2011-01-03 LAB — GLUCOSE, CAPILLARY
Glucose-Capillary: 121 mg/dL — ABNORMAL HIGH (ref 70–99)
Glucose-Capillary: 149 mg/dL — ABNORMAL HIGH (ref 70–99)
Glucose-Capillary: 155 mg/dL — ABNORMAL HIGH (ref 70–99)
Glucose-Capillary: 169 mg/dL — ABNORMAL HIGH (ref 70–99)

## 2011-01-09 LAB — GLUCOSE, CAPILLARY: Glucose-Capillary: 247 mg/dL — ABNORMAL HIGH (ref 70–99)

## 2011-01-09 LAB — POCT I-STAT 4, (NA,K, GLUC, HGB,HCT)
Glucose, Bld: 260 mg/dL — ABNORMAL HIGH (ref 70–99)
HCT: 39 % (ref 36.0–46.0)
Hemoglobin: 13.3 g/dL (ref 12.0–15.0)
Potassium: 4.6 mEq/L (ref 3.5–5.1)
Sodium: 136 mEq/L (ref 135–145)

## 2011-02-15 NOTE — Assessment & Plan Note (Signed)
OFFICE VISIT   Wanda [name], Wanda [name]  DOB:  Jul 02, 1961                                       09/23/2010  [id number]   The patient was a no show for her office visit today.  She was  apparently sent by Dr. Justin Mend for evaluation of peripheral arterial  disease.     Jessy Oto. Fields, MD  Electronically Signed   CEF/MEDQ  D:  09/23/2010  T:  09/24/2010  Job:  [id number]   cc:   Sherril Croon, M.D.

## 2011-02-15 NOTE — Op Note (Signed)
Wanda [name], Wanda [name]             ACCOUNT NO.:  [id number]   MEDICAL RECORD NO.:  [id number]          PATIENT TYPE:  AMB   LOCATION:  SDS                          FACILITY:  [facility]   PHYSICIAN:  Jessy Oto. Fields, MD  DATE OF BIRTH:  10/03/1961   DATE OF PROCEDURE:  04/30/2009  DATE OF DISCHARGE:  04/30/2009                               OPERATIVE REPORT   PROCEDURE:  Right radiocephalic arteriovenous fistula.   PREOPERATIVE DIAGNOSIS:  End-stage renal disease.   POSTOPERATIVE DIAGNOSIS:  End-stage renal disease.   ANESTHESIA:  Local with IV sedation.   ASSISTANT:  Chad Cordial, PA-C   OPERATIVE FINDINGS:  1. 1-mm radial artery.  2. A999333 cephalic vein.   OPERATIVE DETAILS:  After obtaining informed consent, the patient was  taken to the operating room.  The patient was placed in the supine  position on the operating table.  After adequate sedation, the patient's  entire right upper extremity was prepped and draped in the usual sterile  fashion.  Local anesthesia was infiltrated between the cephalic vein and  radial artery.  A longitudinal incision was made in this location and  carried down through the subcutaneous tissues down the level of the  cephalic vein.  Cephalic vein was dissected free circumferentially and  small side branches were ligated and divided by between silk ties.  The  vein was approximately 2.5 mm in diameter.  Dissection was carried down  the level of the wrist.  The vein was ligated in this location and  transected.  The vein was then gently distended with heparinized saline.  The vein would accept up to a 3.5-mm dilator.  The vein prior to  dilation had some spasm and was approximately 2-2.5 mm in diameter.   Next, a radial artery was dissected free in the medial portion of the  incision.  This also had a fair amount of spasm.  The vessel was  approximately 1 mm in diameter, but did have a good quality pulse.  The  patient was given 5000 units of  intravenous heparin.  The artery was  controlled proximally and distally with fine bulldog clamps.  The vein  was controlled proximally with a fine bulldog clamp.  The vein was then  sewn end of vein to side of artery using a running 7-0 Prolene suture.  Just prior to completion of anastomosis, this was forebled, backbled,  and thoroughly flushed.  Anastomosis was secured.  Clamps were released.  There was palpable thrill in the fistula immediately.  Hemostasis was  obtained.  Subcutaneous tissues were reapproximated using running 3-0  Vicryl suture.  Skin was  closed with 4-0 Vicryl subcuticular stitch.  The patient tolerated the  procedure well, and there were no complications.  Instrument, sponge,  and needle counts were correct at the end of the case.  The patient was  taken to the recovery room in stable condition.      Jessy Oto. Fields, MD  Electronically Signed     CEF/MEDQ  D:  04/30/2009  T:  04/30/2009  Job:  [id number]

## 2011-02-15 NOTE — Procedures (Signed)
CEPHALIC VEIN MAPPING   INDICATION:  Preop for AV fistula.   HISTORY:  Chronic renal disease.   EXAM:  The right cephalic and basilic veins are compressible.   Diameter measurements range from 0.67 to 0.31 cm.   The left cephalic vein and basilic veins are compressible.   Diameter measurements range from 0.57 cm to 0.26 cm.   See attached worksheet for all measurements.   IMPRESSION:  Patent bilateral cephalic and cephalic veins which are of  acceptable diameter for use as a dialysis access site.   ___________________________________________  P. Drucie Opitz, M.D.   AC/MEDQ  D:  04/23/2009  T:  04/23/2009  Job:  [id number]

## 2011-02-15 NOTE — Consult Note (Signed)
VASCULAR SURGERY CONSULTATION   Wanda [name], Wanda [name]  DOB:  03/24/1961                                       04/23/2009  [id number]   REFERRING PHYSICIAN:  Sherril Croon, M.[name].   REFERRAL DIAGNOSIS:  Stage IV chronic kidney disease.   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  She is referred at this time  for placement of permanent hemodialysis access.  She is approaching  hemodialysis.  Duplex evaluation of her upper extremities revealed  patent cephalic veins bilaterally.  These measure 0.34 up to 0.50 in the  right upper extremity.  She is left-handed.   PAST MEDICAL HISTORY:  1. Type 2 diabetes.  2. Hypertension.  3. Chronic kidney disease.  4. Hyperlipidemia.  5. Obesity.  6. Hypothyroidism.  7. Chronic anemia.   MEDICATIONS:  1. Novolin 70/30.  2. Lipitor 1 tablet daily.  3. Amlodipine 10 mg daily.  4. Spironolactone 50 mg daily.  5. Coreg 25 mg b.i.[name].  6. Levothroid 25 mcg daily.  7. Lasix 80 mg daily.  8. Vitamin [name] 1 tablet daily.  9. Novolin insulin b.i.[name].  10.MiraLax b.i.[name].   ALLERGIES:  Vicodin and Darvocet.   FAMILY HISTORY:  Noncontributory.   SOCIAL HISTORY:  The patient is married with three children.  She works  for [employer].  No alcohol or tobacco use.   REVIEW OF SYSTEMS:  The patient notes problems with chronic weight gain.  She suffers from constipation.  She does have pain in her feet.  Occasional headaches.   PHYSICAL EXAM:  An obese 50 year old [place] American female.  No acute  distress.  Alert and oriented.  Vital signs:  BP 125/83, pulse is 91 per  minute.  Upper extremities:  2+ brachial and radial pulses bilaterally.  No upper extremity edema.   IMPRESSION:  1. Stage IV chronic kidney disease.  2. Hypertension.  3. Hyperlipidemia.  4. Type 2 diabetes.  5. Obesity.  6. Hypothyroidism.   DISPOSITION:  The patient will be scheduled to undergo placement of a  right arm arteriovenous fistula, diagram reveals 0.34 mm vein at the  right wrist, 67 mm cephalic vein in the antecubital fossa.   Dorothea Glassman, M.[name].  Electronically Signed  PGH/MEDQ  [name]:  04/23/2009  T:  04/24/2009  Job:  [id number]   cc:   Sherril Croon, M.[name].

## 2011-02-18 NOTE — Op Note (Signed)
[facility]  Patient:    Wanda [name], Wanda [name] Visit Number: [id number] MRN: [id number]          Service Type: DSU Location: [facility] Attending Physician:  Courtney Heys Dictated by:   Darlyn Chamber, M.D. Proc. Date: 01/04/02 Admit Date:  01/04/2002 Discharge Date: 01/04/2002                             Operative Report  PREOPERATIVE DIAGNOSIS:       Menorrhagia.  POSTOPERATIVE DIAGNOSIS:      Menorrhagia.  OPERATION/PROCEDURE:          Endometrial cryoablation.  SURGEON:                      Darlyn Chamber, M.D.  ANESTHESIA:                   General.  ESTIMATED BLOOD LOSS:         Minimal.  PACKS/DRAINS:                 None.  INTRAOPERATIVE BLOOD REPLACED:                     None.  COMPLICATIONS:                None.  INDICATIONS:                  Noted in the History and Physical.  DESCRIPTION OF PROCEDURE:     The patient was taken to the OR and placed in the supine position.  After a satisfactory level of general anesthesia was obtained the patient was placed in the dorsal lithotomy position using Allen stirrups.  The patient was then draped out.  Examination under anesthesia revealed the uterus to be mid position, normal size and shape.  Adnexa unremarkable.  A speculum was placed in the vaginal vault.  The cervix was grasped with a single-tooth tenaculum and the uterus sounded to approximately 9 cm.  The cervix was serially dilated to a size 29 Pratt dilator.  The cryoablation probe was then insertion, first into the right uterine fundus. Freezing was undertaken for six minutes.  The probe was then repositioned to the left uterine fundus and freezing was undertaken for six minutes.  The patient tolerated the procedure well.  There were no signs of perforation or complications, or active bleeding.  The speculum and single-tooth tenaculum were removed.  The patient was taken out of the dorsal lithotomy position. Once alert,  transferred to the recovery room in good condition.  Sponge, instrument, and needle counts were correct as reported by the circulating nurse. Dictated by:   Darlyn Chamber, M.D. Attending Physician:  Courtney Heys DD:  01/04/02 TD:  01/05/02 Job: [id number]

## 2011-02-18 NOTE — H&P (Signed)
[facility]  Patient:    Wanda [name], Wanda [name] Visit Number: [id number] MRN: [id number]          Service Type: DSU Location: [facility] Attending Physician:  Courtney Heys Dictated by:   Darlyn Chamber, M.D. Admit Date:  01/04/2002 Discharge Date: 01/04/2002                           History and Physical  REASON FOR ADMISSION:         The patient is a 50 year old gravida 3 para 3 married black female who presents for endometrial ablation for management of menorrhagia.  HISTORY OF PRESENT ILLNESS:   Patients cycles are regular.  She describes seven days of flow, four days being heavy - changing pads and tampons every two hours with clots.  This has resulted in a fairly significant anemia for her in the past resulting in the need for significant iron sulfate supplementation.  We have done a saline infusion ultrasound which was basically unremarkable.  There was no evidence of polyps and/or fibroids. Ovaries appeared to be normal.  Endometrial sampling revealed benign proliferative endometrium with no hyperplasia.  We have discussed options including hysterectomy versus endometrial ablation versus hormonal management. We are somewhat limited hormonally as she is an insulin-dependent diabetic. She presents now for endometrial ablation.  ALLERGIES:                    VICODIN.  MEDICATIONS:                  Glucophage, Diovan, and insulin.  PAST MEDICAL HISTORY:         Usual childhood diseases, no significant sequelae.  Is an insulin-dependent diabetic under active management.  PREVIOUS SURGICAL HISTORY:    She has had two prior cesarean sections and a bilateral tubal ligation in 1987.  FAMILY HISTORY:               Brother with a history of diabetes mellitus. Father with history of lung cancer.  Mother with a history of breast cancer.  SOCIAL HISTORY:               No tobacco or alcohol use.  REVIEW OF SYSTEMS:             Noncontributory.  PHYSICAL EXAMINATION:  VITAL SIGNS:                  Patient is afebrile with stable vital signs.  HEENT:                        Patient is normocephalic.  Pupils are equal, round, and reactive to light and accommodation.  Extraocular movements were intact.  Sclerae and conjunctivae were clear.  Oropharynx clear.  NECK:                         Without thyromegaly.  BREASTS:                      No discrete masses.  LUNGS:                        Clear.  CARDIOVASCULAR:               Regular rhythm and rate without murmurs or gallops.  ABDOMEN:  Benign.  No masses, organomegaly, or tenderness.  PELVIC:                       Normal external genitalia, vaginal mucosa is clear.  Cervix unremarkable.  Uterus normal size, shape, and contour.  Adnexa free of masses or tenderness.  EXTREMITIES:                  Trace edema.  NEUROLOGIC:                   Grossly within normal limits.  IMPRESSION:                   1. Menorrhagia.                               2. Insulin-dependent diabetic.  PLAN:                         The patient to undergo cryoablation.  The risks of surgery have been discussed, including the risk of infection; the risk of uterine perforation that could lead to injury to adjacent organs requiring exploratory surgery; the risk of vascular injury that could lead to hemorrhage requiring hysterectomy or transfusion.  She does understand that this is not always 100% successful, that continued bleeding problems may occur requiring further operative management. Dictated by:   Darlyn Chamber, M.D. Attending Physician:  Courtney Heys DD:  01/04/02 TD:  01/04/02 Job: [id number]

## 2011-02-18 NOTE — Procedures (Signed)
[facility]  Patient:    Wanda [name], Wanda [name] Visit Number: [id number] MRN: [id number]          Service Type: END Location: [facility]O Attending Physician:  Rafael Bihari Dictated by:   Elyse Jarvis Amedeo Plenty, M.D. Proc. Date: 11/12/01 Admit Date:  11/12/2001   CC:         Eli Hose, M.D.   Procedure Report  PROCEDURE:  Colonoscopy.  INDICATION FOR PROCEDURE:  Family history of colon cancer in a first-degree relative.  DESCRIPTION OF PROCEDURE:  The patient was placed in the left lateral decubitus position and placed on the pulse monitor with continuous low-flow oxygen delivered by nasal cannula.  She was sedated with 70 mg of IV Demerol and 7.5 mg of IV Versed.  The Olympus video colonoscope was inserted into the rectum and advanced to the cecum, confirmed by transillumination at McBurneys point and visualization of the ileocecal valve and appendiceal orifice.  The prep was excellent.  The cecum, ascending, transverse and descending colon all appeared normal with no masses, polyps, diverticula or other mucosal abnormalities.  Within the sigmoid colon, there was seen a 6 mm sessile polyp which was fulgurated by hot biopsy.  The remainder of the sigmoid and rectum appeared normal, and retroflex view of the anus revealed no obvious internal hemorrhoids.  The colonoscope was then withdrawn, and the patient returned to the recovery room in stable condition.  She tolerated the procedure well, and there were no immediate complications.  IMPRESSION: 1. Small sigmoid colon polyps. 2. Otherwise normal colonoscopy.  PLAN:  Await histology and will begin her on Miralax for her constipation. Dictated by:   Elyse Jarvis Amedeo Plenty, M.D. Attending Physician:  Rafael Bihari DD:  11/12/01 TD:  11/12/01 Job: [id number]

## 2011-03-08 ENCOUNTER — Encounter: Payer: Self-pay | Admitting: Internal Medicine

## 2011-03-09 ENCOUNTER — Institutional Professional Consult (permissible substitution): Payer: 59 | Admitting: Internal Medicine

## 2011-03-10 ENCOUNTER — Ambulatory Visit: Payer: Self-pay | Admitting: Vascular Surgery

## 2011-03-18 ENCOUNTER — Ambulatory Visit: Payer: 59

## 2011-03-21 ENCOUNTER — Institutional Professional Consult (permissible substitution): Payer: 59 | Admitting: Internal Medicine

## 2011-03-22 ENCOUNTER — Ambulatory Visit (INDEPENDENT_AMBULATORY_CARE_PROVIDER_SITE_OTHER): Payer: 59 | Admitting: Internal Medicine

## 2011-03-22 ENCOUNTER — Encounter: Payer: Self-pay | Admitting: Internal Medicine

## 2011-03-22 ENCOUNTER — Telehealth: Payer: Self-pay | Admitting: *Deleted

## 2011-03-22 VITALS — BP 160/102 | HR 102 | Temp 98.3°F | Ht 69.0 in | Wt 271.4 lb

## 2011-03-22 DIAGNOSIS — R059 Cough, unspecified: Secondary | ICD-10-CM | POA: Insufficient documentation

## 2011-03-22 DIAGNOSIS — R05 Cough: Secondary | ICD-10-CM

## 2011-03-22 NOTE — Telephone Encounter (Signed)
[id number] to see if pt could come in at 1:45 today instead of 3pm. [place] Bing, CMA

## 2011-03-22 NOTE — Telephone Encounter (Signed)
Pt was seen today at [facility], [place]

## 2011-03-22 NOTE — Patient Instructions (Signed)
Cough is from sinus drainage, and post viral reactive cough and maybe some element of what we called cyclical cough #Sinus drainage  - start netti pot daily (nurse will show you picture and how to use it) - continue nasonex  - start chlorpheniramine 4mg  po at night (if it makes you too drowsy stop it and call us). Do not take claritin - Dr Darrol Jump told me that you should not take sudafed due to kidney problem #post viral reactive cough  - be patient for this part of your cough #Cyclical cough  - please choose 3 days and observe complete voice rest - no talking or whispering  - At any time there (not just the 3 days of voice rest but anytime) there is urge to cough, drink water or swallow or sip on sugarless throat lozenge #Followup  - Important you follow instructions 100% correctly - I will see you in 5-6 weeks.  - any problems ome sooner

## 2011-03-22 NOTE — Progress Notes (Signed)
Subjective:    Patient ID: Wanda [name], female    DOB: [birth year], 50 y.o.   MRN: [id number]  Cough This is a new (50 year old obese (gastric bypass pending at [facility], sleep study pending at [facility]), non-smoker but exposed to 2nd hand smoke, metabolic syndrome, Chrinic Kidney disease. Not on ACE inhbibitors. 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 The current episode started more than 1 month ago (had a cold in april 2012. Then URI cleared but cough persisted. In May 2012, started choking, and now in June 2012 has associated chest msk pain and dyspnea). The problem has been gradually worsening. The problem occurs constantly. The cough is non-productive (early morning feels sputum but unable to bring it up but by mid-day is a dry hacking cough ). Associated symptoms include nasal congestion, postnasal drip and shortness of breath. Pertinent negatives include no chest pain, chills, ear congestion, ear pain, eye redness, fever, headaches, heartburn, hemoptysis, myalgias, rash, rhinorrhea, sore throat, weight loss or wheezing. Associated symptoms comments: cxr 11/03/2010 - clear (independently reviewed). Runny nose present early in the morning associated with post nasal drainage since URI onset. Also with dyspnea since cough started. Associated gag + since cough started. Gag worsened with drinking water, juice, mild or exposure to cologne and hair spray. Denies GERD rx or symptoms. The symptoms are aggravated by cold air, dust, lying down and pollens (dry air, hair spray, cologne). Risk factors: None. She has tried OTC cough suppressant and prescription cough suppressant (Nasonex tried by Dr. Willy Eddy but without relief. Robitussin, mucinex, dayquil have all been tried wihtout relief) for the symptoms. The treatment provided no relief. Her past medical history is significant for environmental allergies. There is no history of asthma, bronchiectasis, bronchitis, COPD, emphysema or pneumonia. In  spring gets runny nose. Has DM, hyperlipidemia, hypothyroidisim, Chronic kidney disease on transplnat list, (19% only) . Passive smoker. Possible undiagnosed OSA. Denies GERD   Past Medical History  Diagnosis Date  . Hyperlipidemia   . Unspecified essential hypertension   . Shingles   . Dyspnea   . History of migraines   . Obesity   . Hypothyroidism   . Diabetes mellitus type II   . Chronic kidney disease, stage IV (severe)   . Dyspnea on exertion   . Nonspecific abnormal unspecified cardiovascular function study      Family History  Problem Relation Age of Onset  . Colon cancer Father     deceased at age 35     History   Social History  . Marital Status: Married    Spouse Name: N/A    Number of Children: 2  . Years of Education: N/A   Occupational History  . School Recruitment consultant    Social History Main Topics  . Smoking status: Never Smoker   . Smokeless tobacco: Not on file  . Alcohol Use: No  . Drug Use: Not on file  . Sexually Active: Not on file   Other Topics Concern  . Not on file   Social History Narrative  . No narrative on file     Allergies  Allergen Reactions  . Hydrocodone-Acetaminophen     REACTION: headaches  . Propoxyphene N-Acetaminophen     REACTION: headache     Outpatient Prescriptions Prior to Visit  Medication Sig Dispense Refill  . amLODipine (NORVASC) 10 MG tablet Take 10 mg by mouth daily.        [name] Kitchen aspirin 81 MG tablet Take 81 mg by  mouth daily.        [name] Kitchen atorvastatin (LIPITOR) 40 MG tablet Take 40 mg by mouth daily.        . carvedilol (COREG) 25 MG tablet Take 25 mg by mouth 2 (two) times daily.        . furosemide (LASIX) 80 MG tablet Take 80 mg by mouth daily.        . insulin NPH (HUMULIN N,NOVOLIN N) 100 UNIT/ML injection Inject 65 Units into the skin 2 (two) times daily.       [name] Kitchen levothyroxine (SYNTHROID) 50 MCG tablet Take 50 mcg by mouth daily.        [name] Kitchen spironolactone (ALDACTONE) 50 MG tablet Take 50 mg by mouth daily.              Review of Systems  Constitutional: Negative for fever, chills, weight loss and unexpected weight change.  HENT: Positive for postnasal drip. Negative for ear pain, nosebleeds, congestion, sore throat, rhinorrhea, sneezing, trouble swallowing, dental problem and sinus pressure.   Eyes: Negative for redness and itching.  Respiratory: Positive for cough and shortness of breath. Negative for hemoptysis, chest tightness and wheezing.   Cardiovascular: Positive for leg swelling. Negative for chest pain and palpitations.  Gastrointestinal: Negative for heartburn, nausea and vomiting.  Genitourinary: Negative for dysuria.  Musculoskeletal: Negative for myalgias and joint swelling.  Skin: Negative for rash.  Neurological: Negative for headaches.  Hematological: Positive for environmental allergies. Does not bruise/bleed easily.  Psychiatric/Behavioral: Negative for dysphoric mood. The patient is not nervous/anxious.        Objective:   Physical Exam  Vitals reviewed. Constitutional: She is oriented to person, place, and time. She appears well-developed and well-nourished. No distress.       obese  HENT:  Head: Normocephalic and atraumatic.  Right Ear: External ear normal.  Left Ear: External ear normal.  Mouth/Throat: Oropharynx is clear and moist. No oropharyngeal exudate.  Eyes: Conjunctivae and EOM are normal. Pupils are equal, round, and reactive to light. Right eye exhibits no discharge. Left eye exhibits no discharge. No scleral icterus.  Neck: Normal range of motion. Neck supple. No JVD present. No tracheal deviation present. No thyromegaly present.  Cardiovascular: Normal rate, regular rhythm, normal heart sounds and intact distal pulses.  Exam reveals no gallop and no friction rub.   No murmur heard. Pulmonary/Chest: Effort normal and breath sounds normal. No respiratory distress. She has no wheezes. She has no rales. She exhibits no tenderness.  Abdominal: Soft. Bowel  sounds are normal. She exhibits no distension and no mass. There is no tenderness. There is no rebound and no guarding.  Musculoskeletal: Normal range of motion. She exhibits no edema and no tenderness.  Lymphadenopathy:    She has no cervical adenopathy.  Neurological: She is alert and oriented to person, place, and time. She has normal reflexes. No cranial nerve deficit. She exhibits normal muscle tone. Coordination normal.  Skin: Skin is warm and dry. No rash noted. She is not diaphoretic. No erythema. No pallor.  Psychiatric: She has a normal mood and affect. Her behavior is normal. Judgment and thought content normal.          Assessment & Plan:

## 2011-03-22 NOTE — Assessment & Plan Note (Signed)
Cough is from sinus drainage, and post viral reactive cough and maybe some element of what we called cyclical cough #Sinus drainage  - start netti pot daily (nurse will show you picture and how to use it) - continue nasonex  - start chlorpheniramine 4mg  po at night (if it makes you too drowsy stop it and call us). Do not take claritin - I d/w Dr Darrol Jump your nephrologist and he told me that you should not take sudafed due to kidney problem #post viral reactive cough  - be patient for this part of your cough #Cyclical cough  - please choose 3 days and observe complete voice rest - no talking or whispering  - At any time there (not just the 3 days of voice rest but anytime) there is urge to cough, drink water or swallow or sip on sugarless throat lozenge #Followup  - Important you follow instructions 100% correctly - I will see you in 5-6 weeks.  - any problems ome sooner

## 2011-03-30 ENCOUNTER — Ambulatory Visit (INDEPENDENT_AMBULATORY_CARE_PROVIDER_SITE_OTHER): Payer: 59

## 2011-03-30 ENCOUNTER — Encounter (INDEPENDENT_AMBULATORY_CARE_PROVIDER_SITE_OTHER): Payer: 59

## 2011-03-30 DIAGNOSIS — N186 End stage renal disease: Secondary | ICD-10-CM

## 2011-03-30 DIAGNOSIS — T82898A Other specified complication of vascular prosthetic devices, implants and grafts, initial encounter: Secondary | ICD-10-CM

## 2011-03-30 NOTE — Assessment & Plan Note (Signed)
OFFICE VISIT  Wanda [name], Wanda [name] DOB:  1961/03/14                                       03/30/2011 [id number]  DATE OF SURGERY:  November 03, 2010.  Patient presents today for continued follow-up of right brachiocephalic AV fistula placed on November 03, 2010 by Dr. Oneida Alar.  In her initial postop follow-up visit, the patient denied all signs and symptoms of steal, and her incision was well-healed.  She was doing well at that time and without complaint.  On evaluation of the fistula, there was an excellent thrill present.  However, it is felt deep at that time. Secondary to this, Dr. Oneida Alar felt that the patient should continue her hand exercises and follow up in 3 months for a duplex of her fistula. The patient was not then and is still not on dialysis.  The patient's duplex was completed today, and this reveals a patent fistula with a range in diameter from the antecubital fossa to the proximal brachium of 1.2 cm to 0.87 cm and a range in depth from the antecubital fossa to the proximal brachium of 0.38 cm to 1.2 cm.  There is an area of increased velocities within the cephalic vein just past the anastomosis at 4.92 with no plaquing visualized.  There are multiple branches visualized off the cephalic at the mid humerus as well.  The patient has a high bifurcation of the radial and ulnar arteries.  The patient continues to deny signs and symptoms of steal and has no complaints.  Again, she is not on dialysis yet.  Past medical history, medications and allergies are unchanged from previous history and physical.  PHYSICAL EXAMINATION:  Blood pressure 178/77, left arm sitting, heart rate 91, O2 saturation 100% on room air.  In general, she is well- nourished in no acute distress.  Cardiac:  Regular rate and rhythm. Lungs:  Clear to auscultation.  The right radial and ulnar are 2+. There is an excellent thrill in the first portion of the  right brachiocephalic fistula.  This is palpable approximately 2 inches superior to the antecubital fossa.  After that point, the fistula is no longer palpable.  The patient's right hand is warm and well-perfused. Motor and sensation are intact.  ASSESSMENT:  Chronic renal insufficiency in a patient who is not yet on hemodialysis with a mature fistula that is too deep for access.  PLAN:  The patient will be scheduled for right brachiocephalic arteriovenous fistula superficialization within the next 1 to 2 weeks with Dr. Oneida Alar in anticipation of possible need for initiation of dialysis.  The patient understands and agrees to proceed.  Wanda Baptist, PA  V. Leia Alf, MD Electronically Signed  AY/MEDQ  [name]:  03/30/2011  T:  03/30/2011  Job:  [id number]

## 2011-04-01 NOTE — Procedures (Unsigned)
VASCULAR LAB EXAM  INDICATION:  End-stage renal disease, nonmaturing Cimino fistula.  HISTORY: Diabetes:  Yes. Cardiac:  No. Hypertension:  Yes.  EXAM:  IMPRESSION:  Patent and maturing right radiocephalic Cimino fistula with anastomoses at 3.89 over 2.34 m/s.  Multiple branches visualized off the cephalic vein at the mid humerus.  Increased velocities within the cephalic vein just past the anastomoses at 4.92 over 3.00 m/s.  There is no plaquing visualized within the cephalic or significant narrowing. This could be secondary to the change in vessel diameter due to postsurgical changes.  The patient has a high radial ulnar bifurcation.  ___________________________________________ Jessy Oto Fields, MD  OD/MEDQ  D:  03/30/2011  T:  03/30/2011  Job:  [id number]

## 2011-04-07 ENCOUNTER — Encounter (HOSPITAL_COMMUNITY)
Admission: RE | Admit: 2011-04-07 | Discharge: 2011-04-07 | Disposition: A | Discharge status: Home / Self Care | Payer: 59 | Source: Ambulatory Visit | Attending: Vascular Surgery | Admitting: Vascular Surgery

## 2011-04-07 LAB — BASIC METABOLIC PANEL
BUN: 49 mg/dL — ABNORMAL HIGH (ref 6–23)
CO2: 23 mEq/L (ref 19–32)
Calcium: 9.3 mg/dL (ref 8.4–10.5)
Chloride: 106 mEq/L (ref 96–112)
Creatinine, Ser: 3.27 mg/dL — ABNORMAL HIGH (ref 0.50–1.10)
GFR calc Af Amer: 18 mL/min — ABNORMAL LOW (ref >60)
GFR calc non Af Amer: 15 mL/min — ABNORMAL LOW (ref >60)
Glucose, Bld: 66 mg/dL — ABNORMAL LOW (ref 70–99)
Potassium: 4.1 mEq/L (ref 3.5–5.1)
Sodium: 139 mEq/L (ref 135–145)

## 2011-04-07 LAB — SURGICAL PCR SCREEN
MRSA, PCR: NEGATIVE
Staphylococcus aureus: NEGATIVE

## 2011-04-07 LAB — CBC
HCT: 31.2 % — ABNORMAL LOW (ref 36.0–46.0)
Hemoglobin: 10.8 g/dL — ABNORMAL LOW (ref 12.0–15.0)
MCH: 28.1 pg (ref 26.0–34.0)
MCHC: 34.6 g/dL (ref 30.0–36.0)
MCV: 81 fL (ref 78.0–100.0)
Platelets: 322 10*3/uL (ref 150–400)
RBC: 3.85 MIL/uL — ABNORMAL LOW (ref 3.87–5.11)
RDW: 13 % (ref 11.5–15.5)
WBC: 15.6 10*3/uL — ABNORMAL HIGH (ref 4.0–10.5)

## 2011-04-11 ENCOUNTER — Ambulatory Visit (HOSPITAL_COMMUNITY)
Admission: RE | Admit: 2011-04-11 | Discharge: 2011-04-11 | Disposition: A | Discharge status: Home / Self Care | Payer: 59 | Source: Ambulatory Visit | Attending: Vascular Surgery | Admitting: Vascular Surgery

## 2011-04-11 DIAGNOSIS — Y832 Surgical operation with anastomosis, bypass or graft as the cause of abnormal reaction of the patient, or of later complication, without mention of misadventure at the time of the procedure: Secondary | ICD-10-CM | POA: Insufficient documentation

## 2011-04-11 DIAGNOSIS — T82898A Other specified complication of vascular prosthetic devices, implants and grafts, initial encounter: Secondary | ICD-10-CM

## 2011-04-11 DIAGNOSIS — E119 Type 2 diabetes mellitus without complications: Secondary | ICD-10-CM | POA: Insufficient documentation

## 2011-04-11 DIAGNOSIS — T82598A Other mechanical complication of other cardiac and vascular devices and implants, initial encounter: Secondary | ICD-10-CM | POA: Insufficient documentation

## 2011-04-11 DIAGNOSIS — I120 Hypertensive chronic kidney disease with stage 5 chronic kidney disease or end stage renal disease: Secondary | ICD-10-CM | POA: Insufficient documentation

## 2011-04-11 DIAGNOSIS — N186 End stage renal disease: Secondary | ICD-10-CM | POA: Insufficient documentation

## 2011-04-11 DIAGNOSIS — Z01812 Encounter for preprocedural laboratory examination: Secondary | ICD-10-CM | POA: Insufficient documentation

## 2011-04-11 DIAGNOSIS — Z01810 Encounter for preprocedural cardiovascular examination: Secondary | ICD-10-CM | POA: Insufficient documentation

## 2011-04-11 LAB — GLUCOSE, CAPILLARY
Glucose-Capillary: 337 mg/dL — ABNORMAL HIGH (ref 70–99)
Glucose-Capillary: 402 mg/dL — ABNORMAL HIGH (ref 70–99)

## 2011-04-12 LAB — POCT I-STAT 4, (NA,K, GLUC, HGB,HCT)
Glucose, Bld: 349 mg/dL — ABNORMAL HIGH (ref 70–99)
HCT: 32 % — ABNORMAL LOW (ref 36.0–46.0)
Hemoglobin: 10.9 g/dL — ABNORMAL LOW (ref 12.0–15.0)
Potassium: 4.3 mEq/L (ref 3.5–5.1)
Sodium: 135 mEq/L (ref 135–145)

## 2011-04-18 NOTE — Op Note (Signed)
  Wanda [name], Wanda [name]             ACCOUNT NO.:  [id number]  MEDICAL RECORD NO.:  [id number]  LOCATION:  [facility]                         FACILITY:  [facility]  PHYSICIAN:  Jessy Oto. Adryel Wortmann, MD  DATE OF BIRTH:  [birth year]  DATE OF PROCEDURE:  04/11/2011 DATE OF DISCHARGE:  04/11/2011                              OPERATIVE REPORT   PROCEDURE: 1. Superficialization of right brachiocephalic AV fistula with     lipectomy. 2. Ligation of multiple side branches, AV fistula.  PREOPERATIVE DIAGNOSIS:  Non-maturing arteriovenous fistula, right arm.  POSTOPERATIVE DIAGNOSIS:  Non-maturing arteriovenous fistula, right arm.  ANESTHESIA:  General.  ASSISTANT:  Wray Kearns, PA-C.  OPERATIVE DETAILS:  After obtaining informed consent, the patient was taken to the operating room.  The patient was placed in supine position on operating table.  After induction of general anesthesia and placement of laryngeal mask, longitudinal incision was made a few centimeters above the antecubital crease.  Incision was carried down through subcutaneous tissues down to the level of the fistula.  The fistula was patent and of good quality and diameter approximately 6 mm.  This was dissected free circumferentially and the fistula was freed up from the surrounding subcutaneous tissues all the way down to the level of the proximal anastomosis.  Several skip incisions were made throughout the course of the upper arm at this point to completely mobilize the fistula from the arterial anastomosis all the way up to the level of the shoulder.  All small side branches were ligated and divided between silk ties.  After the fistula had been completely freed up circumferentially, I then proceeded to place a 3-0 Vicryl suture at the upper portion of the arm to make sure that the fistula would stay in a position near the skin.  All skin bridges were then carefully inspected and all subcutaneous fat possible was removed so  that there was minimal tissue between the skin and the fistula.  A large amount of subcutaneous fat was also removed from the tissue surrounding the fistula in its bed.  At this point, the skin incisions were all closed with running 4-0 Vicryl subcuticular stitch.  The patient tolerated the procedure well and there were no complications.  There was palpable thrill in the fistula at the end of the case.     Jessy Oto. Petrea Fredenburg, MD     CEF/MEDQ  D:  04/12/2011  T:  04/12/2011  Job:  [id number]  Electronically Signed by Ruta Hinds MD on 04/18/2011 09:53:06 AM

## 2011-05-03 ENCOUNTER — Ambulatory Visit: Payer: 59 | Admitting: Internal Medicine

## 2011-05-04 ENCOUNTER — Ambulatory Visit (INDEPENDENT_AMBULATORY_CARE_PROVIDER_SITE_OTHER): Payer: 59

## 2011-05-04 DIAGNOSIS — N186 End stage renal disease: Secondary | ICD-10-CM

## 2011-05-04 NOTE — Assessment & Plan Note (Addendum)
OFFICE VISIT  Wanda [name], Wanda [name] DOB:  09/11/1961                                       05/04/2011 [id number]  DATE OF SURGERY:  April 11, 2011.  The patient presents today for continued follow-up of nonmaturing right brachiocephalic AV fistula.  The patient was seen in the office several weeks ago secondary to this, and duplex confirmed that the fistula had multiple side branches as well as increased depth that was not amenable to hemodialysis.  Secondary to these findings, the patient was scheduled for superficialization of her right brachiocephalic AV fistula with ligation of multiple side branches on April 11, 2011.  The patient tolerated this procedure well and has had no difficulty since then.  She denies all signs and symptoms of steal.  She is not currently on dialysis.  PHYSICAL EXAMINATION:  Blood pressure 178/87, O2 saturation 99% on room air, heart rate 84. GENERAL:  She is well-nourished in no acute distress. The right hand is warm and well-perfused.  Motor and sensation are intact.  The incisions are healing well.  There is a 2+ palpable radial pulse on the right.  The fistula is easily palpable up to the proximal brachium.  The fistula, however, is still somewhat small in caliber from approximately the mid upper arm up to the proximal brachium.  There is an excellent thrill in the fistula.  ASSESSMENT/PLAN:  The patient is doing well status post superficialization of the right brachiocephalic arteriovenous fistula. She is not on dialysis yet.  We will bring the patient back for 1 more follow-up visit in approximately 1 month to insure further maturation of the fistula.  The patient is to call with any questions, issues, or problems in the interim.  Leta Baptist, PA  Charles E. Fields, MD Electronically Signed  AY/MEDQ  [name]:  05/04/2011  T:  05/04/2011  Job:  [id number]

## 2011-05-11 DIAGNOSIS — N186 End stage renal disease: Secondary | ICD-10-CM

## 2011-05-24 DIAGNOSIS — N186 End stage renal disease: Secondary | ICD-10-CM

## 2011-06-22 ENCOUNTER — Encounter: Payer: Self-pay | Admitting: Vascular Surgery

## 2011-06-23 ENCOUNTER — Ambulatory Visit: Payer: 59 | Admitting: Vascular Surgery

## 2011-07-05 ENCOUNTER — Encounter: Payer: Self-pay | Admitting: Vascular Surgery

## 2011-07-06 ENCOUNTER — Encounter: Payer: Self-pay | Admitting: Vascular Surgery

## 2011-07-07 ENCOUNTER — Ambulatory Visit: Payer: 59 | Admitting: Vascular Surgery

## 2011-07-12 LAB — BASIC METABOLIC PANEL
BUN: 31 — ABNORMAL HIGH
CO2: 25
Calcium: 9.5
Chloride: 104
Creatinine, Ser: 2.53 — ABNORMAL HIGH
GFR calc Af Amer: 25 — ABNORMAL LOW
GFR calc non Af Amer: 20 — ABNORMAL LOW
Glucose, Bld: 251 — ABNORMAL HIGH
Potassium: 4.9
Sodium: 136

## 2011-07-20 ENCOUNTER — Encounter: Payer: Self-pay | Admitting: Vascular Surgery

## 2011-07-21 ENCOUNTER — Ambulatory Visit (INDEPENDENT_AMBULATORY_CARE_PROVIDER_SITE_OTHER): Payer: PRIVATE HEALTH INSURANCE | Admitting: Physician Assistant

## 2011-07-21 ENCOUNTER — Ambulatory Visit: Payer: 59 | Admitting: Vascular Surgery

## 2011-07-21 VITALS — BP 177/78 | HR 89 | Temp 98.0°F | Ht 70.0 in | Wt 260.0 lb

## 2011-07-21 DIAGNOSIS — N186 End stage renal disease: Secondary | ICD-10-CM

## 2011-07-21 NOTE — Progress Notes (Signed)
Subjective:     Patient ID: Wanda [name], female   DOB: 1961-08-12, 50 y.o.   MRN: [id number]  HPI 50 y/o female who is s/p ligation of branches  And superficialization of a right brachiocephalic AVF on [id number].  She followed up in August and it was felt that she needed to return to ensure further maturation of her AVF.  She denies any steal symptoms and is free of complaints.  Review of Systems  See HPI    Objective:   Physical Exam  Filed Vitals:   07/21/11 1328  BP: 177/78  Pulse: 89  Temp: 98 F (36.7 C)    Gen: NAD RUE:  2+ radial pulse; the fistula has a good thrill and bruit; her incisions are well healed with minimal keyloiding.  Her had is warm and well perfused.    Assessment:     50 y/o female who is s/p ligation of branches and superficialization of a right brachiocephalic AVF [id number]    Plan:     She is doing well and her AVF has matured and is ready for use. She is to follow up with [place] as needed.

## 2011-09-05 ENCOUNTER — Ambulatory Visit: Payer: 59 | Admitting: Pulmonary Disease

## 2011-09-23 ENCOUNTER — Ambulatory Visit: Payer: 59 | Admitting: Pulmonary Disease

## 2011-10-04 HISTORY — PX: TRANSPLANTATION RENAL: SUR1385

## 2011-10-10 DIAGNOSIS — N186 End stage renal disease: Secondary | ICD-10-CM

## 2011-10-20 ENCOUNTER — Ambulatory Visit (INDEPENDENT_AMBULATORY_CARE_PROVIDER_SITE_OTHER): Payer: PRIVATE HEALTH INSURANCE | Admitting: Ophthalmology

## 2011-11-07 ENCOUNTER — Ambulatory Visit (INDEPENDENT_AMBULATORY_CARE_PROVIDER_SITE_OTHER): Payer: PRIVATE HEALTH INSURANCE | Admitting: Ophthalmology

## 2011-11-07 ENCOUNTER — Ambulatory Visit (INDEPENDENT_AMBULATORY_CARE_PROVIDER_SITE_OTHER): Payer: 59 | Admitting: Ophthalmology

## 2011-11-07 DIAGNOSIS — H43819 Vitreous degeneration, unspecified eye: Secondary | ICD-10-CM

## 2011-11-07 DIAGNOSIS — E1139 Type 2 diabetes mellitus with other diabetic ophthalmic complication: Secondary | ICD-10-CM

## 2011-11-07 DIAGNOSIS — E1165 Type 2 diabetes mellitus with hyperglycemia: Secondary | ICD-10-CM

## 2011-11-07 DIAGNOSIS — I10 Essential (primary) hypertension: Secondary | ICD-10-CM

## 2011-11-07 DIAGNOSIS — E11319 Type 2 diabetes mellitus with unspecified diabetic retinopathy without macular edema: Secondary | ICD-10-CM

## 2011-11-07 DIAGNOSIS — H35039 Hypertensive retinopathy, unspecified eye: Secondary | ICD-10-CM

## 2011-11-09 ENCOUNTER — Encounter (INDEPENDENT_AMBULATORY_CARE_PROVIDER_SITE_OTHER): Payer: 59 | Admitting: Ophthalmology

## 2011-11-09 DIAGNOSIS — S058X9A Other injuries of unspecified eye and orbit, initial encounter: Secondary | ICD-10-CM

## 2011-11-09 DIAGNOSIS — H43819 Vitreous degeneration, unspecified eye: Secondary | ICD-10-CM

## 2011-11-09 DIAGNOSIS — E1139 Type 2 diabetes mellitus with other diabetic ophthalmic complication: Secondary | ICD-10-CM

## 2011-11-09 DIAGNOSIS — E11319 Type 2 diabetes mellitus with unspecified diabetic retinopathy without macular edema: Secondary | ICD-10-CM

## 2011-11-10 ENCOUNTER — Encounter (INDEPENDENT_AMBULATORY_CARE_PROVIDER_SITE_OTHER): Payer: 59 | Admitting: Ophthalmology

## 2011-11-10 DIAGNOSIS — S058X9A Other injuries of unspecified eye and orbit, initial encounter: Secondary | ICD-10-CM

## 2011-11-11 ENCOUNTER — Encounter (INDEPENDENT_AMBULATORY_CARE_PROVIDER_SITE_OTHER): Payer: 59 | Admitting: Ophthalmology

## 2011-11-11 DIAGNOSIS — S058X9A Other injuries of unspecified eye and orbit, initial encounter: Secondary | ICD-10-CM

## 2011-11-15 ENCOUNTER — Ambulatory Visit: Payer: PRIVATE HEALTH INSURANCE | Admitting: Internal Medicine

## 2011-11-23 ENCOUNTER — Ambulatory Visit (INDEPENDENT_AMBULATORY_CARE_PROVIDER_SITE_OTHER): Payer: 59 | Admitting: Ophthalmology

## 2011-12-01 ENCOUNTER — Ambulatory Visit (INDEPENDENT_AMBULATORY_CARE_PROVIDER_SITE_OTHER): Payer: 59 | Admitting: Ophthalmology

## 2011-12-07 ENCOUNTER — Ambulatory Visit (INDEPENDENT_AMBULATORY_CARE_PROVIDER_SITE_OTHER): Payer: 59 | Admitting: Ophthalmology

## 2011-12-22 ENCOUNTER — Ambulatory Visit (INDEPENDENT_AMBULATORY_CARE_PROVIDER_SITE_OTHER): Payer: 59 | Admitting: Ophthalmology

## 2011-12-22 DIAGNOSIS — H40019 Open angle with borderline findings, low risk, unspecified eye: Secondary | ICD-10-CM

## 2011-12-22 DIAGNOSIS — E1139 Type 2 diabetes mellitus with other diabetic ophthalmic complication: Secondary | ICD-10-CM

## 2011-12-22 DIAGNOSIS — E11319 Type 2 diabetes mellitus with unspecified diabetic retinopathy without macular edema: Secondary | ICD-10-CM

## 2011-12-22 DIAGNOSIS — E1165 Type 2 diabetes mellitus with hyperglycemia: Secondary | ICD-10-CM

## 2012-02-07 ENCOUNTER — Encounter (HOSPITAL_COMMUNITY): Payer: Self-pay | Admitting: *Deleted

## 2012-02-07 ENCOUNTER — Emergency Department (HOSPITAL_COMMUNITY)
Admission: EM | Admit: 2012-02-07 | Discharge: 2012-02-07 | Disposition: A | Discharge status: Home / Self Care | Payer: 59 | Attending: Emergency Medicine | Admitting: Emergency Medicine

## 2012-02-07 ENCOUNTER — Emergency Department (HOSPITAL_COMMUNITY): Payer: 59

## 2012-02-07 DIAGNOSIS — R059 Cough, unspecified: Secondary | ICD-10-CM | POA: Insufficient documentation

## 2012-02-07 DIAGNOSIS — R739 Hyperglycemia, unspecified: Secondary | ICD-10-CM

## 2012-02-07 DIAGNOSIS — I129 Hypertensive chronic kidney disease with stage 1 through stage 4 chronic kidney disease, or unspecified chronic kidney disease: Secondary | ICD-10-CM | POA: Insufficient documentation

## 2012-02-07 DIAGNOSIS — E119 Type 2 diabetes mellitus without complications: Secondary | ICD-10-CM | POA: Insufficient documentation

## 2012-02-07 DIAGNOSIS — E785 Hyperlipidemia, unspecified: Secondary | ICD-10-CM | POA: Insufficient documentation

## 2012-02-07 DIAGNOSIS — R071 Chest pain on breathing: Secondary | ICD-10-CM | POA: Insufficient documentation

## 2012-02-07 DIAGNOSIS — N184 Chronic kidney disease, stage 4 (severe): Secondary | ICD-10-CM | POA: Insufficient documentation

## 2012-02-07 DIAGNOSIS — E039 Hypothyroidism, unspecified: Secondary | ICD-10-CM | POA: Insufficient documentation

## 2012-02-07 DIAGNOSIS — R05 Cough: Secondary | ICD-10-CM | POA: Insufficient documentation

## 2012-02-07 DIAGNOSIS — R0789 Other chest pain: Secondary | ICD-10-CM

## 2012-02-07 DIAGNOSIS — Z9889 Other specified postprocedural states: Secondary | ICD-10-CM | POA: Insufficient documentation

## 2012-02-07 LAB — CBC
HCT: 31.8 % — ABNORMAL LOW (ref 36.0–46.0)
Hemoglobin: 11 g/dL — ABNORMAL LOW (ref 12.0–15.0)
MCH: 28.9 pg (ref 26.0–34.0)
MCHC: 34.6 g/dL (ref 30.0–36.0)
MCV: 83.7 fL (ref 78.0–100.0)
Platelets: 252 10*3/uL (ref 150–400)
RBC: 3.8 MIL/uL — ABNORMAL LOW (ref 3.87–5.11)
RDW: 13 % (ref 11.5–15.5)
WBC: 12.4 10*3/uL — ABNORMAL HIGH (ref 4.0–10.5)

## 2012-02-07 LAB — APTT: aPTT: 33 seconds (ref 24–37)

## 2012-02-07 LAB — GLUCOSE, CAPILLARY
Glucose-Capillary: 455 mg/dL — ABNORMAL HIGH (ref 70–99)
Glucose-Capillary: 473 mg/dL — ABNORMAL HIGH (ref 70–99)
Glucose-Capillary: 415 mg/dL — ABNORMAL HIGH (ref 70–99)
Glucose-Capillary: 305 mg/dL — ABNORMAL HIGH (ref 70–99)
Glucose-Capillary: 190 mg/dL — ABNORMAL HIGH (ref 70–99)

## 2012-02-07 LAB — COMPREHENSIVE METABOLIC PANEL
ALT: 11 U/L (ref 0–35)
AST: 13 U/L (ref 0–37)
Albumin: 3.3 g/dL — ABNORMAL LOW (ref 3.5–5.2)
Alkaline Phosphatase: 123 U/L — ABNORMAL HIGH (ref 39–117)
BUN: 74 mg/dL — ABNORMAL HIGH (ref 6–23)
CO2: 22 mEq/L (ref 19–32)
Calcium: 9.2 mg/dL (ref 8.4–10.5)
Chloride: 96 mEq/L (ref 96–112)
Creatinine, Ser: 4.72 mg/dL — ABNORMAL HIGH (ref 0.50–1.10)
GFR calc Af Amer: 11 mL/min — ABNORMAL LOW (ref >90)
GFR calc non Af Amer: 10 mL/min — ABNORMAL LOW (ref >90)
Glucose, Bld: 491 mg/dL — ABNORMAL HIGH (ref 70–99)
Potassium: 4.7 mEq/L (ref 3.5–5.1)
Sodium: 129 mEq/L — ABNORMAL LOW (ref 135–145)
Total Bilirubin: 0.2 mg/dL — ABNORMAL LOW (ref 0.3–1.2)
Total Protein: 7.4 g/dL (ref 6.0–8.3)

## 2012-02-07 LAB — PROTIME-INR
INR: 1.07 (ref 0.00–1.49)
Prothrombin Time: 14.1 seconds (ref 11.6–15.2)

## 2012-02-07 LAB — POCT I-STAT TROPONIN I
Troponin i, poc: 0 ng/mL (ref 0.00–0.08)
Troponin i, poc: 0 ng/mL (ref 0.00–0.08)

## 2012-02-07 LAB — D-DIMER, QUANTITATIVE (NOT AT ARMC): D-Dimer, Quant: 0.45 ug/mL-FEU (ref 0.00–0.48)

## 2012-02-07 MED ORDER — MORPHINE SULFATE 4 MG/ML IJ SOLN
4.0000 mg | Freq: Once | INTRAMUSCULAR | Status: AC
  Administered 2012-02-07: 4 mg via INTRAVENOUS
  Filled 2012-02-07: qty 1

## 2012-02-07 MED ORDER — ASPIRIN 81 MG PO CHEW
324.0000 mg | CHEWABLE_TABLET | Freq: Once | ORAL | Status: AC
  Administered 2012-02-07: 324 mg via ORAL
  Filled 2012-02-07: qty 4

## 2012-02-07 MED ORDER — SODIUM CHLORIDE 0.9 % IV SOLN
1000.0000 mL | INTRAVENOUS | Status: DC
  Administered 2012-02-07: 1000 mL via INTRAVENOUS

## 2012-02-07 MED ORDER — NITROGLYCERIN 2 % TD OINT
1.0000 [in_us] | TOPICAL_OINTMENT | Freq: Four times a day (QID) | TRANSDERMAL | Status: DC
  Administered 2012-02-07: 1 [in_us] via TOPICAL
  Filled 2012-02-07: qty 1

## 2012-02-07 MED ORDER — TRAMADOL HCL 50 MG PO TABS: 50.0000 mg | ORAL_TABLET | Freq: Four times a day (QID) | ORAL | Status: DC | PRN

## 2012-02-07 MED ORDER — SODIUM CHLORIDE 0.9 % IV SOLN: INTRAVENOUS | Status: DC

## 2012-02-07 MED ORDER — SODIUM CHLORIDE 0.9 % IV SOLN
INTRAVENOUS | Status: DC
  Administered 2012-02-07: 4 [IU]/h via INTRAVENOUS
  Filled 2012-02-07: qty 1

## 2012-02-07 NOTE — ED Notes (Signed)
Pt presents c/o sharp, mid-sternal, non-radiating chest pain since last night at 0000. Pt reports pain is re-produceable with movement. Denies SOB, nausea, vomiting or diaphoresis. Pt resting with family at bedside. Denies further needs at this time

## 2012-02-07 NOTE — ED Notes (Signed)
No beds currently available in CDU. Will notify when available.

## 2012-02-07 NOTE — ED Notes (Signed)
pts CBG 455

## 2012-02-07 NOTE — Discharge Instructions (Signed)
Chest Pain (Nonspecific) It is often hard to give a specific diagnosis for the cause of chest pain. There is always a chance that your pain could be related to something serious, such as a heart attack or a blood clot in the lungs. You need to follow up with your caregiver for further evaluation. CAUSES   Heartburn.   Pneumonia or bronchitis.   Anxiety or stress.   Inflammation around your heart (pericarditis) or lung (pleuritis or pleurisy).   A blood clot in the lung.   A collapsed lung (pneumothorax). It can develop suddenly on its own (spontaneous pneumothorax) or from injury (trauma) to the chest.   Shingles infection (herpes zoster virus).  The chest wall is composed of bones, muscles, and cartilage. Any of these can be the source of the pain.  The bones can be bruised by injury.   The muscles or cartilage can be strained by coughing or overwork.   The cartilage can be affected by inflammation and become sore (costochondritis).  DIAGNOSIS  Lab tests or other studies, such as X-rays, electrocardiography, stress testing, or cardiac imaging, may be needed to find the cause of your pain.  TREATMENT   Treatment depends on what may be causing your chest pain. Treatment may include:   Acid blockers for heartburn.   Anti-inflammatory medicine.   Pain medicine for inflammatory conditions.   Antibiotics if an infection is present.   You may be advised to change lifestyle habits. This includes stopping smoking and avoiding alcohol, caffeine, and chocolate.   You may be advised to keep your head raised (elevated) when sleeping. This reduces the chance of acid going backward from your stomach into your esophagus.   Most of the time, nonspecific chest pain will improve within 2 to 3 days with rest and mild pain medicine.  HOME CARE INSTRUCTIONS   If antibiotics were prescribed, take your antibiotics as directed. Finish them even if you start to feel better.   For the next few  days, avoid physical activities that bring on chest pain. Continue physical activities as directed.   Do not smoke.   Avoid drinking alcohol.   Only take over-the-counter or prescription medicine for pain, discomfort, or fever as directed by your caregiver.   Follow your caregiver's suggestions for further testing if your chest pain does not go away.   Keep any follow-up appointments you made. If you do not go to an appointment, you could develop lasting (chronic) problems with pain. If there is any problem keeping an appointment, you must call to reschedule.  SEEK MEDICAL CARE IF:   You think you are having problems from the medicine you are taking. Read your medicine instructions carefully.   Your chest pain does not go away, even after treatment.   You develop a rash with blisters on your chest.  SEEK IMMEDIATE MEDICAL CARE IF:   You have increased chest pain or pain that spreads to your arm, neck, jaw, back, or abdomen.   You develop shortness of breath, an increasing cough, or you are coughing up blood.   You have severe back or abdominal pain, feel nauseous, or vomit.   You develop severe weakness, fainting, or chills.   You have a fever.  THIS IS AN EMERGENCY. Do not wait to see if the pain will go away. Get medical help at once. Call your local emergency services (911 in [place] Do not drive yourself to the hospital. MAKE SURE YOU:   Understand these instructions.     Will watch your condition.   Will get help right away if you are not doing well or get worse.  Document Released: 06/29/2005 Document Revised: 09/08/2011 Document Reviewed: 04/24/2008 Exit[employer] Patient Information 2012 Exit[employer], LLCChest Pain (Nonspecific) It is often hard to give a specific diagnosis for the cause of chest pain. There is always a chance that your pain could be related to something serious, such as a heart attack or a blood clot in the lungs. You need to follow up with your caregiver for  further evaluation. CAUSES   Heartburn.   Pneumonia or bronchitis.   Anxiety or stress.   Inflammation around your heart (pericarditis) or lung (pleuritis or pleurisy).   A blood clot in the lung.   A collapsed lung (pneumothorax). It can develop suddenly on its own (spontaneous pneumothorax) or from injury (trauma) to the chest.   Shingles infection (herpes zoster virus).  The chest wall is composed of bones, muscles, and cartilage. Any of these can be the source of the pain.  The bones can be bruised by injury.   The muscles or cartilage can be strained by coughing or overwork.   The cartilage can be affected by inflammation and become sore (costochondritis).  DIAGNOSIS  Lab tests or other studies, such as X-rays, electrocardiography, stress testing, or cardiac imaging, may be needed to find the cause of your pain.  TREATMENT   Treatment depends on what may be causing your chest pain. Treatment may include:   Acid blockers for heartburn.   Anti-inflammatory medicine.   Pain medicine for inflammatory conditions.   Antibiotics if an infection is present.   You may be advised to change lifestyle habits. This includes stopping smoking and avoiding alcohol, caffeine, and chocolate.   You may be advised to keep your head raised (elevated) when sleeping. This reduces the chance of acid going backward from your stomach into your esophagus.   Most of the time, nonspecific chest pain will improve within 2 to 3 days with rest and mild pain medicine.  HOME CARE INSTRUCTIONS   If antibiotics were prescribed, take your antibiotics as directed. Finish them even if you start to feel better.   For the next few days, avoid physical activities that bring on chest pain. Continue physical activities as directed.   Do not smoke.   Avoid drinking alcohol.   Only take over-the-counter or prescription medicine for pain, discomfort, or fever as directed by your caregiver.   Follow your  caregiver's suggestions for further testing if your chest pain does not go away.   Keep any follow-up appointments you made. If you do not go to an appointment, you could develop lasting (chronic) problems with pain. If there is any problem keeping an appointment, you must call to reschedule.  SEEK MEDICAL CARE IF:   You think you are having problems from the medicine you are taking. Read your medicine instructions carefully.   Your chest pain does not go away, even after treatment.   You develop a rash with blisters on your chest.  SEEK IMMEDIATE MEDICAL CARE IF:   You have increased chest pain or pain that spreads to your arm, neck, jaw, back, or abdomen.   You develop shortness of breath, an increasing cough, or you are coughing up blood.   You have severe back or abdominal pain, feel nauseous, or vomit.   You develop severe weakness, fainting, or chills.   You have a fever.  THIS IS AN EMERGENCY. Do not wait to  see if the pain will go away. Get medical help at once. Call your local emergency services (911 in [place] Do not drive yourself to the hospital. MAKE SURE YOU:   Understand these instructions.   Will watch your condition.   Will get help right away if you are not doing well or get worse.  Document Released: 06/29/2005 Document Revised: 09/08/2011 Document Reviewed: 04/24/2008 [facility] Patient Information 2012 [place].[name] Kitchen

## 2012-02-07 NOTE — ED Provider Notes (Signed)
History     CSN: [id number]  Arrival date & time 02/07/12  [id number]   First MD Initiated Contact with Patient 02/07/12 [phone]      Chief Complaint  Patient presents with  . Chest Pain     Patient is a 51 y.o. female presenting with chest pain. The history is provided by the patient.  Chest Pain The chest pain began 12 - 24 hours ago. Chest pain occurs constantly (Pt states it lasted for one hour last night and then woke her up again this morning at 7am). The chest pain is unchanged. The pain is associated with breathing. The severity of the pain is moderate. The quality of the pain is described as sharp. The pain radiates to the left arm (left leg). Chest pain is worsened by deep breathing. Primary symptoms include cough. Pertinent negatives for primary symptoms include no fever, no shortness of breath, no nausea and no vomiting.  Pertinent negatives for associated symptoms include no paroxysmal nocturnal dyspnea and no weakness.  Pertinent negatives for past medical history include no CAD, no MI and no PE.  Her family medical history is significant for CAD in family. Family history comments: Father lung ca (deceassed)  Procedure history is negative for cardiac catheterization.     Past Medical History  Diagnosis Date  . Hyperlipidemia   . Unspecified essential hypertension   . Shingles   . Dyspnea   . History of migraines   . Obesity   . Hypothyroidism   . Diabetes mellitus type II   . Chronic kidney disease, stage IV (severe)   . Dyspnea on exertion   . Nonspecific abnormal unspecified cardiovascular function study   . Foot pain   . Anemia     Past Surgical History  Procedure Date  . Laparoscopic cholecystectomy w/ cholangiography   . Av fistula placement, radiocephalic     right  . Cesarean section   . Endometrial cryoablation   . Colonoscopy   . Cholecystectomy   . Varicose vein removal   . Av fistula placement     Family History  Problem Relation Age of Onset  .  Colon cancer Father     deceased at age 100    History  Substance Use Topics  . Smoking status: Never Smoker   . Smokeless tobacco: Not on file  . Alcohol Use: No    OB History    Grav Para Term Preterm Abortions TAB SAB Ect Mult Living                  Review of Systems  Constitutional: Negative for fever.  Respiratory: Positive for cough. Negative for shortness of breath.   Cardiovascular: Positive for chest pain.  Gastrointestinal: Negative for nausea and vomiting.  Neurological: Negative for weakness.  All other systems reviewed and are negative.    Allergies  Hydrocodone-acetaminophen and Propoxyphene-acetaminophen  Home Medications   Current Outpatient Rx  Name Route Sig Dispense Refill  . ALLOPURINOL 100 MG PO TABS Oral Take 100 mg by mouth daily.    [name] Kitchen AMLODIPINE BESYLATE 10 MG PO TABS Oral Take 10 mg by mouth 2 (two) times daily.     . ASPIRIN 81 MG PO TABS Oral Take 81 mg by mouth daily.      [name] Kitchen CARVEDILOL 25 MG PO TABS Oral Take 25 mg by mouth 2 (two) times daily.      [name] Kitchen VITAMIN B-12 PO Oral Take 1 tablet by mouth daily.     [name] Kitchen  ERGOCALCIFEROL 50000 UNITS PO CAPS Oral Take 50,000 Units by mouth every 30 (thirty) days.     [name] Kitchen FLUOXETINE HCL 20 MG PO CAPS  daily.     . FUROSEMIDE 80 MG PO TABS Oral Take 80 mg by mouth 2 (two) times daily.     [name] Kitchen GABAPENTIN 100 MG PO CAPS  daily.     . INSULIN ISOPHANE HUMAN 100 UNIT/ML Charlo SUSP Subcutaneous Inject 85 Units into the skin 2 (two) times daily.     [name] Kitchen LEVOTHYROXINE SODIUM 50 MCG PO TABS Oral Take 50 mcg by mouth daily.      [name] Kitchen MIRALAX PO Oral Take by mouth 2 (two) times daily.      [name] Kitchen SPIRONOLACTONE 50 MG PO TABS Oral Take 50 mg by mouth daily.      . TRAMADOL HCL 50 MG PO TABS Oral Take 50 mg by mouth at bedtime as needed. For pain      BP 170/80  Pulse 89  Temp(Src) 98.1 F (36.7 C) (Oral)  Resp 18  SpO2 99%  Physical Exam  Nursing note and vitals reviewed. Constitutional: She appears well-developed and  well-nourished. No distress.  HENT:  Head: Normocephalic and atraumatic.  Right Ear: External ear normal.  Left Ear: External ear normal.  Eyes: Conjunctivae are normal. Right eye exhibits no discharge. Left eye exhibits no discharge. No scleral icterus.  Neck: Neck supple. No tracheal deviation present.  Cardiovascular: Normal rate, regular rhythm and intact distal pulses.   Pulmonary/Chest: Effort normal and breath sounds normal. No stridor. No respiratory distress. She has no wheezes. She has no rales. She exhibits tenderness (reproduces her pain).  Abdominal: Soft. Bowel sounds are normal. She exhibits no distension. There is no tenderness. There is no rebound and no guarding.  Musculoskeletal: She exhibits no edema and no tenderness.  Neurological: She is alert. She has normal strength. No sensory deficit. Cranial nerve deficit:  no gross defecits noted. She exhibits normal muscle tone. She displays no seizure activity. Coordination normal.  Skin: Skin is warm and dry. No rash noted.  Psychiatric: She has a normal mood and affect.    ED Course  Procedures (including critical care time)  Rate: 90  Rhythm: normal sinus rhythm  QRS Axis: normal  Intervals: normal  ST/T Wave abnormalities: normal  Conduction Disutrbances:none  Narrative Interpretation: Minimal voltage criteria for LVH  Old EKG Reviewed: No changes noted  Medications  allopurinol (ZYLOPRIM) 100 MG tablet (not administered)  0.9 %  sodium chloride infusion (1000 mL Intravenous New Bag/Given 02/07/12 0953)  nitroGLYCERIN (NITROGLYN) 2 % ointment 1 inch (1 inch Topical Given 02/07/12 1228)  insulin regular (NOVOLIN R,HUMULIN R) 1 Units/mL in sodium chloride 0.9 % 100 mL infusion (8.3 Units/hr Intravenous Rate/Dose Change 02/07/12 1227)  aspirin chewable tablet 324 mg (324 mg Oral Given 02/07/12 0954)  morphine 4 MG/ML injection 4 mg (4 mg Intravenous Given 02/07/12 0955)    Labs Reviewed  CBC - Abnormal; Notable for the  following:    WBC 12.4 (*)    RBC 3.80 (*)    Hemoglobin 11.0 (*)    HCT 31.8 (*)    All other components within normal limits  COMPREHENSIVE METABOLIC PANEL - Abnormal; Notable for the following:    Sodium 129 (*)    Glucose, Bld 491 (*)    BUN 74 (*)    Creatinine, Ser 4.72 (*)    Albumin 3.3 (*)    Alkaline Phosphatase 123 (*)    Total  Bilirubin 0.2 (*)    GFR calc non Af Amer 10 (*)    GFR calc Af Amer 11 (*)    All other components within normal limits  GLUCOSE, CAPILLARY - Abnormal; Notable for the following:    Glucose-Capillary 455 (*)    All other components within normal limits  GLUCOSE, CAPILLARY - Abnormal; Notable for the following:    Glucose-Capillary 473 (*)    All other components within normal limits  PROTIME-INR  APTT  D-DIMER, QUANTITATIVE  POCT I-STAT TROPONIN I  POCT I-STAT TROPONIN I   Dg Chest Portable 1 View  02/07/2012  *RADIOLOGY REPORT*  Clinical Data: Upper mid chest pain, cough, history diabetes, end- stage renal disease  PORTABLE CHEST - 1 VIEW  Comparison: Portable exam 0933 hours compared to 11/03/2010  Findings: Normal heart size, mediastinal contours, and pulmonary vascularity. Lungs clear. Left pleural effusion or pneumothorax. Bones unremarkable.  IMPRESSION: No acute abnormalities.  Original Report Authenticated By: Burnetta Sabin, M.D.     MDM  The patient's symptoms are atypical for acute coronary syndrome. She has no risk factors for pulmonary embolism and has a negative d-dimer. I doubt pulmonary embolism. She has reproducible chest wall tenderness and I suspect this is the etiology of her pain. Patient was noted to be hyperglycemic.  She has been treated with insulin IV. When she is at a reasonable level the plan will be to discharge her home with close followup with her primary doctor.        Kathalene Frames, MD 02/07/12 [phone]

## 2012-02-07 NOTE — ED Notes (Signed)
Repeat CBG 473. Insulin dose adjusted to 8.3 per order

## 2012-02-07 NOTE — ED Notes (Signed)
Pt was layiing in bed and set up and started having chest pain last nite and then states had some left leg and arm numbness she noticed when putting on clothes.  Chest hurts with deep breath and movement.  Pt sts she has leg numbness from diabetes.

## 2012-02-19 ENCOUNTER — Emergency Department (HOSPITAL_COMMUNITY): Payer: 59

## 2012-02-19 ENCOUNTER — Encounter (HOSPITAL_COMMUNITY): Payer: Self-pay

## 2012-02-19 ENCOUNTER — Emergency Department (HOSPITAL_COMMUNITY)
Admission: EM | Admit: 2012-02-19 | Discharge: 2012-02-19 | Disposition: A | Discharge status: Home / Self Care | Payer: 59 | Attending: Emergency Medicine | Admitting: Emergency Medicine

## 2012-02-19 DIAGNOSIS — Z7982 Long term (current) use of aspirin: Secondary | ICD-10-CM | POA: Insufficient documentation

## 2012-02-19 DIAGNOSIS — M25579 Pain in unspecified ankle and joints of unspecified foot: Secondary | ICD-10-CM | POA: Insufficient documentation

## 2012-02-19 DIAGNOSIS — S91109A Unspecified open wound of unspecified toe(s) without damage to nail, initial encounter: Secondary | ICD-10-CM | POA: Insufficient documentation

## 2012-02-19 DIAGNOSIS — Z79899 Other long term (current) drug therapy: Secondary | ICD-10-CM | POA: Insufficient documentation

## 2012-02-19 DIAGNOSIS — W010XXA Fall on same level from slipping, tripping and stumbling without subsequent striking against object, initial encounter: Secondary | ICD-10-CM | POA: Insufficient documentation

## 2012-02-19 DIAGNOSIS — E119 Type 2 diabetes mellitus without complications: Secondary | ICD-10-CM | POA: Insufficient documentation

## 2012-02-19 DIAGNOSIS — S91119A Laceration without foreign body of unspecified toe without damage to nail, initial encounter: Secondary | ICD-10-CM

## 2012-02-19 DIAGNOSIS — E039 Hypothyroidism, unspecified: Secondary | ICD-10-CM | POA: Insufficient documentation

## 2012-02-19 DIAGNOSIS — N184 Chronic kidney disease, stage 4 (severe): Secondary | ICD-10-CM | POA: Insufficient documentation

## 2012-02-19 DIAGNOSIS — I129 Hypertensive chronic kidney disease with stage 1 through stage 4 chronic kidney disease, or unspecified chronic kidney disease: Secondary | ICD-10-CM | POA: Insufficient documentation

## 2012-02-19 DIAGNOSIS — S93409A Sprain of unspecified ligament of unspecified ankle, initial encounter: Secondary | ICD-10-CM | POA: Insufficient documentation

## 2012-02-19 DIAGNOSIS — M79609 Pain in unspecified limb: Secondary | ICD-10-CM | POA: Insufficient documentation

## 2012-02-19 DIAGNOSIS — S93401A Sprain of unspecified ligament of right ankle, initial encounter: Secondary | ICD-10-CM

## 2012-02-19 DIAGNOSIS — M7989 Other specified soft tissue disorders: Secondary | ICD-10-CM | POA: Insufficient documentation

## 2012-02-19 DIAGNOSIS — S91309A Unspecified open wound, unspecified foot, initial encounter: Secondary | ICD-10-CM | POA: Insufficient documentation

## 2012-02-19 DIAGNOSIS — E785 Hyperlipidemia, unspecified: Secondary | ICD-10-CM | POA: Insufficient documentation

## 2012-02-19 DIAGNOSIS — Z794 Long term (current) use of insulin: Secondary | ICD-10-CM | POA: Insufficient documentation

## 2012-02-19 LAB — GLUCOSE, CAPILLARY: Glucose-Capillary: 144 mg/dL — ABNORMAL HIGH (ref 70–99)

## 2012-02-19 MED ORDER — TRAMADOL HCL 50 MG PO TABS: 50.0000 mg | ORAL_TABLET | Freq: Four times a day (QID) | ORAL | Status: AC | PRN

## 2012-02-19 MED ORDER — TETANUS-DIPHTH-ACELL PERTUSSIS 5-2.5-18.5 LF-MCG/0.5 IM SUSP
0.5000 mL | Freq: Once | INTRAMUSCULAR | Status: AC
  Administered 2012-02-19: 0.5 mL via INTRAMUSCULAR
  Filled 2012-02-19: qty 0.5

## 2012-02-19 MED ORDER — CEPHALEXIN 500 MG PO CAPS: 500.0000 mg | ORAL_CAPSULE | Freq: Four times a day (QID) | ORAL | Status: AC

## 2012-02-19 NOTE — ED Notes (Signed)
CBG 144 on ED Glucometer

## 2012-02-19 NOTE — ED Notes (Addendum)
Pt in from home with right foot laceration states stepped on nails 2 days ago states pain has increased pt presents with 2 puncture wounds to the bottom of foot also some swelling noted to the area pt states she is a diabetic

## 2012-02-19 NOTE — ED Notes (Signed)
Patient transported to X-ray 

## 2012-02-19 NOTE — ED Provider Notes (Signed)
History     CSN: [id number]  Arrival date & time 02/19/12  1659   First MD Initiated Contact with Patient 02/19/12 1812      Chief Complaint  Patient presents with  . Foot Injury    (Consider location/radiation/quality/duration/timing/severity/associated sxs/prior treatment) Patient is a 51 y.o. female presenting with foot injury. The history is provided by the patient.  Foot Injury  The incident occurred 2 days ago. The incident occurred at home. The injury mechanism was torsion and a fall. The pain is present in the right foot and right ankle. The quality of the pain is described as aching. The pain is at a severity of 4/10. Pertinent negatives include no numbness.  Pt states she tripped, fell, twisting right ankle and right foot states at that time, stepped on sharp nails with no shoes on. States she is diabetic, and did not notice lacerations to the foot at first. States now swelling and pain to right ankle, foot, and two lacerations to sole of her foot. States she trimmed them down, cleaned with peroxide. States she is afraid they will get infected due to her diabetes. States also pain with walking in ankle and foot.   Past Medical History  Diagnosis Date  . Hyperlipidemia   . Unspecified essential hypertension   . Shingles   . Dyspnea   . History of migraines   . Obesity   . Hypothyroidism   . Diabetes mellitus type II   . Chronic kidney disease, stage IV (severe)   . Dyspnea on exertion   . Nonspecific abnormal unspecified cardiovascular function study   . Foot pain   . Anemia     Past Surgical History  Procedure Date  . Laparoscopic cholecystectomy w/ cholangiography   . Av fistula placement, radiocephalic     right  . Cesarean section   . Endometrial cryoablation   . Colonoscopy   . Cholecystectomy   . Varicose vein removal   . Av fistula placement     Family History  Problem Relation Age of Onset  . Colon cancer Father     deceased at age 57     History  Substance Use Topics  . Smoking status: Never Smoker   . Smokeless tobacco: Not on file  . Alcohol Use: No    OB History    Grav Para Term Preterm Abortions TAB SAB Ect Mult Living                  Review of Systems  Constitutional: Negative for fever and chills.  Respiratory: Negative.   Cardiovascular: Negative.   Musculoskeletal: Positive for joint swelling.  Skin: Positive for wound.  Neurological: Negative for weakness and numbness.    Allergies  Hydrocodone-acetaminophen and Propoxyphene-acetaminophen  Home Medications   Current Outpatient Rx  Name Route Sig Dispense Refill  . ALLOPURINOL 100 MG PO TABS Oral Take 100 mg by mouth daily.    [name] Kitchen AMLODIPINE BESYLATE 10 MG PO TABS Oral Take 10 mg by mouth 2 (two) times daily.     . ASPIRIN 81 MG PO TABS Oral Take 81 mg by mouth daily.      [name] Kitchen CARVEDILOL 25 MG PO TABS Oral Take 25 mg by mouth 2 (two) times daily.      [name] Kitchen VITAMIN B-12 PO Oral Take 1 tablet by mouth daily.     . ERGOCALCIFEROL 50000 UNITS PO CAPS Oral Take 50,000 Units by mouth every 30 (thirty) days.     [name] Kitchen FLUOXETINE  HCL 20 MG PO CAPS  daily.     . FUROSEMIDE 80 MG PO TABS Oral Take 80 mg by mouth 2 (two) times daily.     [name] Kitchen GABAPENTIN 100 MG PO CAPS  daily.     . INSULIN ISOPHANE HUMAN 100 UNIT/ML [place] SUSP Subcutaneous Inject 85 Units into the skin 2 (two) times daily.     [name] Kitchen LEVOTHYROXINE SODIUM 50 MCG PO TABS Oral Take 50 mcg by mouth daily.      [name] Kitchen SPIRONOLACTONE 50 MG PO TABS Oral Take 50 mg by mouth daily.      . TRAMADOL HCL 50 MG PO TABS Oral Take 1 tablet (50 mg total) by mouth every 6 (six) hours as needed. For pain 30 tablet 0    BP 158/75  Pulse 88  Temp(Src) 98.4 F (36.9 C) (Oral)  Resp 20  SpO2 100%  Physical Exam  Nursing note and vitals reviewed. Constitutional: She is oriented to person, place, and time. She appears well-developed and well-nourished. No distress.  HENT:  Head: Normocephalic.  Eyes: Conjunctivae are  normal.  Neck: Neck supple.  Cardiovascular: Normal rate, regular rhythm and normal heart sounds.   Pulmonary/Chest: Effort normal and breath sounds normal. No respiratory distress. She has no wheezes. She has no rales.  Musculoskeletal:       Right ankle and foot swelling noted. Tender to palpation over lateral malleolus, and over 2nd, 3rd, 4th metatarsals. Pain with ankle dorsiflexions, plantarflexion, with toe movements of  2-4 toes. There are also two cuts to the foot. One larger one with trimmed borders, appearing like an ulceration with healthy skin, no drainage, non tender and a small puncture like wound, also non tender, no drainage, no erythema  Neurological: She is alert and oriented to person, place, and time.  Skin: Skin is warm and dry. No erythema.  Psychiatric: She has a normal mood and affect.    ED Course  Procedures (including critical care time)  Results for orders placed during the hospital encounter of 02/19/12  GLUCOSE, CAPILLARY      Component Value Range   Glucose-Capillary 144 (*) 70 - 99 (mg/dL)   Dg Ankle Complete Right  02/19/2012  *RADIOLOGY REPORT*  Clinical Data: Right ankle pain and swelling due to a twisting injury.  RIGHT ANKLE - COMPLETE 3+ VIEW  Comparison: None.  Findings: There is no fracture, dislocation, ankle joint effusion, or other significant acute abnormality.  Prominent soft tissues around the ankle.  IMPRESSION: No acute abnormalities.  Original Report Authenticated By: Larey Seat, M.D.   Dg Foot Complete Right  02/19/2012  *RADIOLOGY REPORT*  Clinical Data: Foot injury.  RIGHT FOOT COMPLETE - 3+ VIEW  Comparison: None.  Findings: There is no fracture, dislocation, or other acute osseous abnormality.  No significant arthritic changes.  IMPRESSION: No significant abnormality.  Original Report Authenticated By: Larey Seat, M.D.      X-rays negative. Foot soaked in iodine and warm soapy water. Bacitracin applied. Will treat with pain  medications, follow up. Will start on antibiotic because diabetic, to prevent infection.    1. Right ankle sprain   2. Laceration of toe of right foot       MDM          Renold Genta, PA 02/20/12 0148

## 2012-02-19 NOTE — ED Notes (Signed)
Pt states she fell down stairs 2 days ago 2 open areas noted to bottom of right foot. Last pm pt began having increase in pain and swelling to foot and ankle. Pt denies injury to the ankle.

## 2012-02-19 NOTE — Discharge Instructions (Signed)
Your x-ray is negative. Ice your ankle and keep it elevated. Keep your cuts clean. Neosporin twice a day. Keep it covered. Keflex as prescribed to prevent infection. Take ultram for severe pain in addition to ibuprofen. Follow up in 3 days to have your foot rechecked.   Ankle Sprain An ankle sprain is an injury to the strong, fibrous tissues (ligaments) that hold the bones of your ankle joint together.  CAUSES Ankle sprain usually is caused by a fall or by twisting your ankle. People who participate in sports are more prone to these types of injuries.  SYMPTOMS  Symptoms of ankle sprain include:  Pain in your ankle. The pain may be present at rest or only when you are trying to stand or walk.   Swelling.   Bruising. Bruising may develop immediately or within 1 to 2 days after your injury.   Difficulty standing or walking.  DIAGNOSIS  Your caregiver will ask you details about your injury and perform a physical exam of your ankle to determine if you have an ankle sprain. During the physical exam, your caregiver will press and squeeze specific areas of your foot and ankle. Your caregiver will try to move your ankle in certain ways. An X-ray exam may be done to be sure a bone was not broken or a ligament did not separate from one of the bones in your ankle (avulsion).  TREATMENT  Certain types of braces can help stabilize your ankle. Your caregiver can make a recommendation for this. Your caregiver may recommend the use of medication for pain. If your sprain is severe, your caregiver may refer you to a surgeon who helps to restore function to parts of your skeletal system (orthopedist) or a physical therapist. [place] ice to your injury for 1 to 2 days or as directed by your caregiver. Applying ice helps to reduce inflammation and pain.  Put ice in a plastic bag.   Place a towel between your skin and the bag.   Leave the ice on for 15 to 20 minutes at a time, every 2  hours while you are awake.   Take over-the-counter or prescription medicines for pain, discomfort, or fever only as directed by your caregiver.   Keep your injured leg elevated, when possible, to lessen swelling.   If your caregiver recommends crutches, use them as instructed. Gradually, put weight on the affected ankle. Continue to use crutches or a cane until you can walk without feeling pain in your ankle.   If you have a plaster splint, wear the splint as directed by your caregiver. Do not rest it on anything harder than a pillow the first 24 hours. Do not put weight on it. Do not get it wet. You may take it off to take a shower or bath.   You may have been given an elastic bandage to wear around your ankle to provide support. If the elastic bandage is too tight (you have numbness or tingling in your foot or your foot becomes cold and blue), adjust the bandage to make it comfortable.   If you have an air splint, you may blow more air into it or let air out to make it more comfortable. You may take your splint off at night and before taking a shower or bath.   Wiggle your toes in the splint several times per day if you are able.  SEEK MEDICAL CARE IF:   You have an increase in bruising, swelling,  or pain.   Your toes feel cold.   Pain relief is not achieved with medication.  SEEK IMMEDIATE MEDICAL CARE IF: Your toes are numb or blue or you have severe pain. MAKE SURE YOU:   Understand these instructions.   Will watch your condition.   Will get help right away if you are not doing well or get worse.  Document Released: 09/19/2005 Document Revised: 09/08/2011 Document Reviewed: 04/23/2008 [facility] Patient Information 2012 [place].  Laceration Care, Adult A laceration is a cut or lesion that goes through all layers of the skin and into the tissue just beneath the skin. TREATMENT  Some lacerations may not require closure. Some lacerations may not be able to be closed due  to an increased risk of infection. It is important to see your caregiver as soon as possible after an injury to minimize the risk of infection and maximize the opportunity for successful closure. If closure is appropriate, pain medicines may be given, if needed. The wound will be cleaned to help prevent infection. Your caregiver will use stitches (sutures), staples, wound glue (adhesive), or skin adhesive strips to repair the laceration. These tools bring the skin edges together to allow for faster healing and a better cosmetic outcome. However, all wounds will heal with a scar. Once the wound has healed, scarring can be minimized by covering the wound with sunscreen during the day for 1 full year. HOME CARE INSTRUCTIONS  For sutures or staples:  Keep the wound clean and dry.   If you were given a bandage (dressing), you should change it at least once a day. Also, change the dressing if it becomes wet or dirty, or as directed by your caregiver.   Wash the wound with soap and water 2 times a day. Rinse the wound off with water to remove all soap. Pat the wound dry with a clean towel.   After cleaning, apply a thin layer of the antibiotic ointment as recommended by your caregiver. This will help prevent infection and keep the dressing from sticking.   You may shower as usual after the first 24 hours. Do not soak the wound in water until the sutures are removed.   Only take over-the-counter or prescription medicines for pain, discomfort, or fever as directed by your caregiver.   Get your sutures or staples removed as directed by your caregiver.  For skin adhesive strips:  Keep the wound clean and dry.   Do not get the skin adhesive strips wet. You may bathe carefully, using caution to keep the wound dry.   If the wound gets wet, pat it dry with a clean towel.   Skin adhesive strips will fall off on their own. You may trim the strips as the wound heals. Do not remove skin adhesive strips that  are still stuck to the wound. They will fall off in time.  For wound adhesive:  You may briefly wet your wound in the shower or bath. Do not soak or scrub the wound. Do not swim. Avoid periods of heavy perspiration until the skin adhesive has fallen off on its own. After showering or bathing, gently pat the wound dry with a clean towel.   Do not apply liquid medicine, cream medicine, or ointment medicine to your wound while the skin adhesive is in place. This may loosen the film before your wound is healed.   If a dressing is placed over the wound, be careful not to apply tape directly over the skin  adhesive. This may cause the adhesive to be pulled off before the wound is healed.   Avoid prolonged exposure to sunlight or tanning lamps while the skin adhesive is in place. Exposure to ultraviolet light in the first year will darken the scar.   The skin adhesive will usually remain in place for 5 to 10 days, then naturally fall off the skin. Do not pick at the adhesive film.  You may need a tetanus shot if:  You cannot remember when you had your last tetanus shot.   You have never had a tetanus shot.  If you get a tetanus shot, your arm may swell, get red, and feel warm to the touch. This is common and not a problem. If you need a tetanus shot and you choose not to have one, there is a rare chance of getting tetanus. Sickness from tetanus can be serious. SEEK MEDICAL CARE IF:   You have redness, swelling, or increasing pain in the wound.   You see a red line that goes away from the wound.   You have yellowish-white fluid (pus) coming from the wound.   You have a fever.   You notice a bad smell coming from the wound or dressing.   Your wound breaks open before or after sutures have been removed.   You notice something coming out of the wound such as wood or glass.   Your wound is on your hand or foot and you cannot move a finger or toe.  SEEK IMMEDIATE MEDICAL CARE IF:   Your pain  is not controlled with prescribed medicine.   You have severe swelling around the wound causing pain and numbness or a change in color in your arm, hand, leg, or foot.   Your wound splits open and starts bleeding.   You have worsening numbness, weakness, or loss of function of any joint around or beyond the wound.   You develop painful lumps near the wound or on the skin anywhere on your body.  MAKE SURE YOU:   Understand these instructions.   Will watch your condition.   Will get help right away if you are not doing well or get worse.  Document Released: 09/19/2005 Document Revised: 09/08/2011 Document Reviewed: 03/15/2011 [facility] Patient Information 2012 [place].

## 2012-02-19 NOTE — ED Notes (Signed)
Pt returns from xray no new complaints

## 2012-02-20 NOTE — ED Provider Notes (Signed)
Medical screening examination/treatment/procedure(s) were performed by non-physician practitioner and as supervising physician I was immediately available for consultation/collaboration.  Kathalene Frames, MD 02/20/12 [phone]

## 2012-05-15 ENCOUNTER — Telehealth: Payer: Self-pay | Admitting: Vascular Surgery

## 2012-05-17 ENCOUNTER — Ambulatory Visit: Payer: 59 | Admitting: Vascular Surgery

## 2012-05-17 DIAGNOSIS — Z9049 Acquired absence of other specified parts of digestive tract: Secondary | ICD-10-CM | POA: Insufficient documentation

## 2012-05-18 DIAGNOSIS — Z940 Kidney transplant status: Secondary | ICD-10-CM | POA: Insufficient documentation

## 2012-05-28 NOTE — Telephone Encounter (Signed)
05/15/2012 11:42 AM Phone (Incoming) Lorri Frederick D (Self) PT. CALLED TO [facility]. WITH DR. FIELDS ON 05-17-12 SHE HAD ANOTHER DOCTOR CHECK HER AVF AND THEY SAID IT WAS FINE

## 2012-06-25 ENCOUNTER — Ambulatory Visit (INDEPENDENT_AMBULATORY_CARE_PROVIDER_SITE_OTHER): Payer: 59 | Admitting: Ophthalmology

## 2012-07-09 ENCOUNTER — Ambulatory Visit (INDEPENDENT_AMBULATORY_CARE_PROVIDER_SITE_OTHER): Payer: Self-pay | Admitting: Ophthalmology

## 2012-07-16 DIAGNOSIS — R339 Retention of urine, unspecified: Secondary | ICD-10-CM | POA: Insufficient documentation

## 2012-07-17 ENCOUNTER — Encounter (HOSPITAL_COMMUNITY): Payer: Self-pay | Admitting: *Deleted

## 2012-07-17 ENCOUNTER — Emergency Department (HOSPITAL_COMMUNITY)
Admission: EM | Admit: 2012-07-17 | Discharge: 2012-07-17 | Disposition: A | Discharge status: Home / Self Care | Payer: 59 | Attending: Emergency Medicine | Admitting: Emergency Medicine

## 2012-07-17 DIAGNOSIS — I129 Hypertensive chronic kidney disease with stage 1 through stage 4 chronic kidney disease, or unspecified chronic kidney disease: Secondary | ICD-10-CM | POA: Insufficient documentation

## 2012-07-17 DIAGNOSIS — N189 Chronic kidney disease, unspecified: Secondary | ICD-10-CM | POA: Insufficient documentation

## 2012-07-17 DIAGNOSIS — E119 Type 2 diabetes mellitus without complications: Secondary | ICD-10-CM | POA: Insufficient documentation

## 2012-07-17 DIAGNOSIS — R319 Hematuria, unspecified: Secondary | ICD-10-CM

## 2012-07-17 DIAGNOSIS — Z794 Long term (current) use of insulin: Secondary | ICD-10-CM | POA: Insufficient documentation

## 2012-07-17 DIAGNOSIS — Z940 Kidney transplant status: Secondary | ICD-10-CM | POA: Insufficient documentation

## 2012-07-17 LAB — CBC
HCT: 34.2 % — ABNORMAL LOW (ref 36.0–46.0)
Hemoglobin: 11.4 g/dL — ABNORMAL LOW (ref 12.0–15.0)
MCH: 29.2 pg (ref 26.0–34.0)
MCHC: 33.3 g/dL (ref 30.0–36.0)
MCV: 87.7 fL (ref 78.0–100.0)
Platelets: 325 10*3/uL (ref 150–400)
RBC: 3.9 MIL/uL (ref 3.87–5.11)
RDW: 14.2 % (ref 11.5–15.5)
WBC: 16.9 10*3/uL — ABNORMAL HIGH (ref 4.0–10.5)

## 2012-07-17 LAB — COMPREHENSIVE METABOLIC PANEL
ALT: 27 U/L (ref 0–35)
AST: 17 U/L (ref 0–37)
Albumin: 3.7 g/dL (ref 3.5–5.2)
Alkaline Phosphatase: 92 U/L (ref 39–117)
BUN: 26 mg/dL — ABNORMAL HIGH (ref 6–23)
CO2: 23 mEq/L (ref 19–32)
Calcium: 9.5 mg/dL (ref 8.4–10.5)
Chloride: 100 mEq/L (ref 96–112)
Creatinine, Ser: 1.5 mg/dL — ABNORMAL HIGH (ref 0.50–1.10)
GFR calc Af Amer: 45 mL/min — ABNORMAL LOW (ref >90)
GFR calc non Af Amer: 39 mL/min — ABNORMAL LOW (ref >90)
Glucose, Bld: 491 mg/dL — ABNORMAL HIGH (ref 70–99)
Potassium: 4.2 mEq/L (ref 3.5–5.1)
Sodium: 134 mEq/L — ABNORMAL LOW (ref 135–145)
Total Bilirubin: 0.1 mg/dL — ABNORMAL LOW (ref 0.3–1.2)
Total Protein: 7.2 g/dL (ref 6.0–8.3)

## 2012-07-17 LAB — GLUCOSE, CAPILLARY: Glucose-Capillary: 384 mg/dL — ABNORMAL HIGH (ref 70–99)

## 2012-07-17 LAB — URINALYSIS, ROUTINE W REFLEX MICROSCOPIC
Bilirubin Urine: NEGATIVE
Glucose, UA: >1000 mg/dL — AB
Ketones, ur: NEGATIVE mg/dL
Leukocytes, UA: NEGATIVE
Nitrite: NEGATIVE
Protein, ur: NEGATIVE mg/dL
Specific Gravity, Urine: 1.024 (ref 1.005–1.030)
Urobilinogen, UA: 0.2 mg/dL (ref 0.0–1.0)
pH: 6 (ref 5.0–8.0)

## 2012-07-17 LAB — URINE MICROSCOPIC-ADD ON

## 2012-07-17 NOTE — ED Notes (Signed)
Lab studies requested by the [facility] doctor and faxed to the pt.  Requests with pt

## 2012-07-17 NOTE — ED Notes (Addendum)
The pt has a dialysis shunt in her rt arm but was never diaklyzed,

## 2012-07-17 NOTE — ED Notes (Signed)
The pt had a kidney transplant aug 16th and since yesterday she has had some bloody urine.  No pain. lmp iud

## 2012-07-17 NOTE — ED Provider Notes (Signed)
History     CSN: [id number]  Arrival date & time 07/17/12  1816   First MD Initiated Contact with Patient 07/17/12 1922      Chief Complaint  Patient presents with  . Hematuria    (Consider location/radiation/quality/duration/timing/severity/associated sxs/prior treatment) HPI Comments: Patient with history of IDDM, HTN, status post kidney transplant at [facility] in 05/2012.  Started two days ago with blood in her urine, both a red color to the urine and small clots.    Patient is a 51 y.o. female presenting with hematuria. The history is provided by the patient.  Hematuria This is a new problem. Episode onset: 2 days ago. The problem has been gradually worsening since onset. She describes the hematuria as gross hematuria. The hematuria occurs throughout her entire urinary stream. She is experiencing no pain. She describes her urine color as dark red. Irritative symptoms do not include frequency or urgency. Pertinent negatives include no chills or fever.    Past Medical History  Diagnosis Date  . Hyperlipidemia   . Unspecified essential hypertension   . Shingles   . Dyspnea   . History of migraines   . Obesity   . Hypothyroidism   . Diabetes mellitus type II   . Chronic kidney disease, stage IV (severe)   . Dyspnea on exertion   . Nonspecific abnormal unspecified cardiovascular function study   . Foot pain   . Anemia     Past Surgical History  Procedure Date  . Laparoscopic cholecystectomy w/ cholangiography   . Av fistula placement, radiocephalic     right  . Cesarean section   . Endometrial cryoablation   . Colonoscopy   . Cholecystectomy   . Varicose vein removal   . Av fistula placement   . Transplantation renal     Family History  Problem Relation Age of Onset  . Colon cancer Father     deceased at age 49    History  Substance Use Topics  . Smoking status: Never Smoker   . Smokeless tobacco: Not on file  . Alcohol Use: No    OB History    Grav Para  Term Preterm Abortions TAB SAB Ect Mult Living                  Review of Systems  Constitutional: Negative for fever and chills.  Genitourinary: Positive for hematuria. Negative for urgency and frequency.  All other systems reviewed and are negative.    Allergies  Hydrocodone-acetaminophen and Propoxyphene-acetaminophen  Home Medications   Current Outpatient Rx  Name Route Sig Dispense Refill  . ALLOPURINOL 100 MG PO TABS Oral Take 100 mg by mouth daily.    [name] Kitchen AMLODIPINE BESYLATE 10 MG PO TABS Oral Take 10 mg by mouth 2 (two) times daily.     . ASPIRIN EC 81 MG PO TBEC Oral Take 81 mg by mouth daily.    [name] Kitchen CARVEDILOL 25 MG PO TABS Oral Take 25 mg by mouth 2 (two) times daily.      . ERGOCALCIFEROL 50000 UNITS PO CAPS Oral Take 50,000 Units by mouth every 30 (thirty) days.     [name] Kitchen FLUOXETINE HCL 20 MG PO CAPS Oral Take 20 mg by mouth daily.     . FUROSEMIDE 80 MG PO TABS Oral Take 80 mg by mouth 2 (two) times daily.     [name] Kitchen GABAPENTIN 100 MG PO CAPS Oral Take 100 mg by mouth 2 (two) times daily.     . INSULIN  ISOPHANE HUMAN 100 UNIT/ML [place] SUSP Subcutaneous Inject 28 Units into the skin at bedtime.     . INSULIN REGULAR HUMAN 100 UNIT/ML [place] SOLN Subcutaneous Inject 24-28 Units into the skin 3 (three) times daily before meals. 28 units in the morning, 24 units at noon, and 24 units at 5pm.    . LEVOTHYROXINE SODIUM 50 MCG PO TABS Oral Take 50 mcg by mouth daily.     [name] Kitchen MAGNESIUM OXIDE 400 MG PO TABS Oral Take 400 mg by mouth 3 (three) times daily.    [name] Kitchen PREDNISONE 5 MG PO TABS Oral Take 4 mg by mouth daily.    [name] Kitchen SPIRONOLACTONE 50 MG PO TABS Oral Take 50 mg by mouth daily.      [name] DS 800-160 MG PO TABS Oral Take 1 tablet by mouth every Monday, Wednesday, and Friday.    [name] Kitchen TACROLIMUS 1 MG PO CAPS Oral Take 6 mg by mouth 2 (two) times daily.    . TRAMADOL HCL 50 MG PO TABS Oral Take 50 mg by mouth at bedtime. For pain    . VALGANCICLOVIR HCL 450 MG PO TABS Oral Take 900 mg  by mouth daily.      BP 178/74  Pulse 100  Temp 98.6 F (37 C) (Oral)  Resp 18  SpO2 99%  Physical Exam  Nursing note and vitals reviewed. Constitutional: She is oriented to person, place, and time. She appears well-developed and well-nourished. No distress.  HENT:  Head: Normocephalic and atraumatic.  Mouth/Throat: Oropharynx is clear and moist.  Neck: Normal range of motion. Neck supple.  Cardiovascular: Normal rate and regular rhythm.   Pulmonary/Chest: Effort normal and breath sounds normal.  Abdominal: Soft. Bowel sounds are normal.       There is a drain in place over the incision site in the right lower abdomen.   The surrounding skin appears without erythema.  There is minimal drainage in the bag.  Musculoskeletal: Normal range of motion. She exhibits no edema.  Neurological: She is alert and oriented to person, place, and time.  Skin: Skin is warm and dry. She is not diaphoretic.    ED Course  Procedures (including critical care time)  Labs Reviewed  CBC - Abnormal; Notable for the following:    WBC 16.9 (*)     Hemoglobin 11.4 (*)     HCT 34.2 (*)     All other components within normal limits  COMPREHENSIVE METABOLIC PANEL - Abnormal; Notable for the following:    Sodium 134 (*)     Glucose, Bld 491 (*)     BUN 26 (*)     Creatinine, Ser 1.50 (*)     Total Bilirubin 0.1 (*)     GFR calc non Af Amer 39 (*)     GFR calc Af Amer 45 (*)     All other components within normal limits  URINALYSIS, ROUTINE W REFLEX MICROSCOPIC - Abnormal; Notable for the following:    Glucose, UA >1000 (*)     Hgb urine dipstick LARGE (*)     All other components within normal limits  URINE MICROSCOPIC-ADD ON - Abnormal; Notable for the following:    Squamous Epithelial / LPF MANY (*)     Bacteria, UA MANY (*)     All other components within normal limits  URINE CULTURE  CMV CULTURE CMVC  TACROLIMUS LEVEL  BK VIRUS PCR   No results found.   No diagnosis found.    MDM  The results of the lab work shows a creatinine of 1.5 which is good for her and no evidence of uti.  I have discussed the patient's care and results of the lab tests with the transplant coordinator at [facility].  They are very familiar with her and agree with my assessment that she is okay for discharge.  They have agreed to make arrangements with Sharyn Lull to arrange a follow up appointment in the near future.  She will return if she worsens.        Veryl Speak, MD 07/17/12 2222

## 2012-07-17 NOTE — ED Notes (Signed)
Pt dc to home.  Pt ambulatory to exit without difficulty.  Pt states understanding to dc paperwork.

## 2012-07-18 LAB — TACROLIMUS LEVEL: Tacrolimus (FK506) - LabCorp: 6.8 ng/mL

## 2012-07-19 LAB — URINE CULTURE: Colony Count: >=100000

## 2012-07-20 LAB — BK VIRUS QUANT PCR, URINE: BK virus DNA, quant PCR: <500 copies/mL (ref <500)

## 2012-07-20 NOTE — ED Notes (Signed)
+   Urine Chart sent to EDP office for review. 

## 2012-07-23 NOTE — ED Notes (Signed)
Chart returned from [place] office .Give ciprofloxacin 500 mg po BID x 7 days. Patient must contact [facility] within 2 days to discuss infection arrange close follow up per [facility] PA-C.

## 2012-07-28 ENCOUNTER — Telehealth (HOSPITAL_COMMUNITY): Payer: Self-pay | Admitting: Emergency Medicine

## 2012-08-15 ENCOUNTER — Other Ambulatory Visit: Payer: Self-pay | Admitting: Obstetrics and Gynecology

## 2012-08-15 DIAGNOSIS — R928 Other abnormal and inconclusive findings on diagnostic imaging of breast: Secondary | ICD-10-CM

## 2012-08-23 ENCOUNTER — Ambulatory Visit
Admission: RE | Admit: 2012-08-23 | Discharge: 2012-08-23 | Disposition: A | Discharge status: Home / Self Care | Payer: 59 | Source: Ambulatory Visit | Attending: Obstetrics and Gynecology | Admitting: Obstetrics and Gynecology

## 2012-08-23 DIAGNOSIS — R928 Other abnormal and inconclusive findings on diagnostic imaging of breast: Secondary | ICD-10-CM

## 2012-09-02 DIAGNOSIS — J189 Pneumonia, unspecified organism: Secondary | ICD-10-CM

## 2012-09-02 HISTORY — DX: Pneumonia, unspecified organism: J18.9

## 2012-09-04 ENCOUNTER — Encounter (HOSPITAL_COMMUNITY): Payer: Self-pay

## 2012-09-04 ENCOUNTER — Inpatient Hospital Stay (HOSPITAL_COMMUNITY)
Admission: EM | Admit: 2012-09-04 | Discharge: 2012-09-07 | DRG: 194 | Disposition: A | Discharge status: Other Acute Inpatient Hospital | Payer: 59 | Attending: Internal Medicine | Admitting: Internal Medicine

## 2012-09-04 ENCOUNTER — Emergency Department (HOSPITAL_COMMUNITY): Payer: 59

## 2012-09-04 DIAGNOSIS — B029 Zoster without complications: Secondary | ICD-10-CM | POA: Diagnosis present

## 2012-09-04 DIAGNOSIS — R05 Cough: Secondary | ICD-10-CM

## 2012-09-04 DIAGNOSIS — E872 Acidosis, unspecified: Secondary | ICD-10-CM | POA: Diagnosis present

## 2012-09-04 DIAGNOSIS — D649 Anemia, unspecified: Secondary | ICD-10-CM

## 2012-09-04 DIAGNOSIS — R9430 Abnormal result of cardiovascular function study, unspecified: Secondary | ICD-10-CM

## 2012-09-04 DIAGNOSIS — R0602 Shortness of breath: Secondary | ICD-10-CM

## 2012-09-04 DIAGNOSIS — R0989 Other specified symptoms and signs involving the circulatory and respiratory systems: Secondary | ICD-10-CM

## 2012-09-04 DIAGNOSIS — E119 Type 2 diabetes mellitus without complications: Secondary | ICD-10-CM

## 2012-09-04 DIAGNOSIS — IMO0001 Reserved for inherently not codable concepts without codable children: Secondary | ICD-10-CM | POA: Diagnosis present

## 2012-09-04 DIAGNOSIS — Z87898 Personal history of other specified conditions: Secondary | ICD-10-CM

## 2012-09-04 DIAGNOSIS — Z794 Long term (current) use of insulin: Secondary | ICD-10-CM

## 2012-09-04 DIAGNOSIS — I129 Hypertensive chronic kidney disease with stage 1 through stage 4 chronic kidney disease, or unspecified chronic kidney disease: Secondary | ICD-10-CM | POA: Diagnosis present

## 2012-09-04 DIAGNOSIS — R059 Cough, unspecified: Secondary | ICD-10-CM

## 2012-09-04 DIAGNOSIS — A419 Sepsis, unspecified organism: Secondary | ICD-10-CM

## 2012-09-04 DIAGNOSIS — N189 Chronic kidney disease, unspecified: Secondary | ICD-10-CM

## 2012-09-04 DIAGNOSIS — Z79899 Other long term (current) drug therapy: Secondary | ICD-10-CM

## 2012-09-04 DIAGNOSIS — R0609 Other forms of dyspnea: Secondary | ICD-10-CM

## 2012-09-04 DIAGNOSIS — N039 Chronic nephritic syndrome with unspecified morphologic changes: Secondary | ICD-10-CM | POA: Diagnosis present

## 2012-09-04 DIAGNOSIS — E039 Hypothyroidism, unspecified: Secondary | ICD-10-CM | POA: Diagnosis present

## 2012-09-04 DIAGNOSIS — E669 Obesity, unspecified: Secondary | ICD-10-CM

## 2012-09-04 DIAGNOSIS — D631 Anemia in chronic kidney disease: Secondary | ICD-10-CM | POA: Diagnosis present

## 2012-09-04 DIAGNOSIS — Z940 Kidney transplant status: Secondary | ICD-10-CM

## 2012-09-04 DIAGNOSIS — E785 Hyperlipidemia, unspecified: Secondary | ICD-10-CM | POA: Diagnosis present

## 2012-09-04 DIAGNOSIS — E1129 Type 2 diabetes mellitus with other diabetic kidney complication: Secondary | ICD-10-CM | POA: Diagnosis present

## 2012-09-04 DIAGNOSIS — N390 Urinary tract infection, site not specified: Secondary | ICD-10-CM | POA: Diagnosis present

## 2012-09-04 DIAGNOSIS — T8610 Unspecified complication of kidney transplant: Secondary | ICD-10-CM

## 2012-09-04 DIAGNOSIS — J189 Pneumonia, unspecified organism: Principal | ICD-10-CM

## 2012-09-04 DIAGNOSIS — E871 Hypo-osmolality and hyponatremia: Secondary | ICD-10-CM

## 2012-09-04 DIAGNOSIS — I10 Essential (primary) hypertension: Secondary | ICD-10-CM

## 2012-09-04 DIAGNOSIS — N184 Chronic kidney disease, stage 4 (severe): Secondary | ICD-10-CM

## 2012-09-04 HISTORY — DX: Type 2 diabetes mellitus with diabetic polyneuropathy: E11.42

## 2012-09-04 HISTORY — DX: Unspecified complication of kidney transplant: T86.10

## 2012-09-04 HISTORY — DX: Peripheral vascular disease, unspecified: I73.9

## 2012-09-04 HISTORY — DX: Gout, unspecified: M10.9

## 2012-09-04 HISTORY — DX: Migraine, unspecified, not intractable, without status migrainosus: G43.909

## 2012-09-04 HISTORY — DX: Unspecified osteoarthritis, unspecified site: M19.90

## 2012-09-04 LAB — CBC WITH DIFFERENTIAL/PLATELET
Basophils Absolute: 0 10*3/uL (ref 0.0–0.1)
Basophils Relative: 0 % (ref 0–1)
Eosinophils Absolute: 0 10*3/uL (ref 0.0–0.7)
Eosinophils Relative: 0 % (ref 0–5)
HCT: 31.9 % — ABNORMAL LOW (ref 36.0–46.0)
Hemoglobin: 10.3 g/dL — ABNORMAL LOW (ref 12.0–15.0)
Lymphocytes Relative: 8 % — ABNORMAL LOW (ref 12–46)
Lymphs Abs: 0.7 10*3/uL (ref 0.7–4.0)
MCH: 27.3 pg (ref 26.0–34.0)
MCHC: 32.3 g/dL (ref 30.0–36.0)
MCV: 84.6 fL (ref 78.0–100.0)
Monocytes Absolute: 0.2 10*3/uL (ref 0.1–1.0)
Monocytes Relative: 3 % (ref 3–12)
Neutro Abs: 7 10*3/uL (ref 1.7–7.7)
Neutrophils Relative %: 89 % — ABNORMAL HIGH (ref 43–77)
Platelets: 178 10*3/uL (ref 150–400)
RBC: 3.77 MIL/uL — ABNORMAL LOW (ref 3.87–5.11)
RDW: 14.1 % (ref 11.5–15.5)
WBC: 7.9 10*3/uL (ref 4.0–10.5)

## 2012-09-04 LAB — BASIC METABOLIC PANEL
BUN: 28 mg/dL — ABNORMAL HIGH (ref 6–23)
CO2: 21 mEq/L (ref 19–32)
Calcium: 8.7 mg/dL (ref 8.4–10.5)
Chloride: 97 mEq/L (ref 96–112)
Creatinine, Ser: 1.75 mg/dL — ABNORMAL HIGH (ref 0.50–1.10)
GFR calc Af Amer: 37 mL/min — ABNORMAL LOW (ref >90)
GFR calc non Af Amer: 32 mL/min — ABNORMAL LOW (ref >90)
Glucose, Bld: 321 mg/dL — ABNORMAL HIGH (ref 70–99)
Potassium: 5.1 mEq/L (ref 3.5–5.1)
Sodium: 128 mEq/L — ABNORMAL LOW (ref 135–145)

## 2012-09-04 LAB — URINALYSIS, ROUTINE W REFLEX MICROSCOPIC
Bilirubin Urine: NEGATIVE
Glucose, UA: 250 mg/dL — AB
Hgb urine dipstick: NEGATIVE
Ketones, ur: NEGATIVE mg/dL
Leukocytes, UA: NEGATIVE
Nitrite: NEGATIVE
Protein, ur: NEGATIVE mg/dL
Specific Gravity, Urine: 1.012 (ref 1.005–1.030)
Urobilinogen, UA: 0.2 mg/dL (ref 0.0–1.0)
pH: 6 (ref 5.0–8.0)

## 2012-09-04 LAB — CBC
HCT: 31.2 % — ABNORMAL LOW (ref 36.0–46.0)
Hemoglobin: 9.9 g/dL — ABNORMAL LOW (ref 12.0–15.0)
MCH: 26.9 pg (ref 26.0–34.0)
MCHC: 31.7 g/dL (ref 30.0–36.0)
MCV: 84.8 fL (ref 78.0–100.0)
Platelets: 175 10*3/uL (ref 150–400)
RBC: 3.68 MIL/uL — ABNORMAL LOW (ref 3.87–5.11)
RDW: 14.2 % (ref 11.5–15.5)
WBC: 6.2 10*3/uL (ref 4.0–10.5)

## 2012-09-04 LAB — CREATININE, SERUM
Creatinine, Ser: 1.77 mg/dL — ABNORMAL HIGH (ref 0.50–1.10)
GFR calc Af Amer: 37 mL/min — ABNORMAL LOW (ref >90)
GFR calc non Af Amer: 32 mL/min — ABNORMAL LOW (ref >90)

## 2012-09-04 LAB — LACTIC ACID, PLASMA: Lactic Acid, Venous: 0.9 mmol/L (ref 0.5–2.2)

## 2012-09-04 LAB — GLUCOSE, CAPILLARY
Glucose-Capillary: 368 mg/dL — ABNORMAL HIGH (ref 70–99)
Glucose-Capillary: 473 mg/dL — ABNORMAL HIGH (ref 70–99)

## 2012-09-04 MED ORDER — VALGANCICLOVIR HCL 450 MG PO TABS: 900.0000 mg | ORAL_TABLET | Freq: Every day | ORAL | Status: DC

## 2012-09-04 MED ORDER — GABAPENTIN 100 MG PO CAPS
100.0000 mg | ORAL_CAPSULE | Freq: Two times a day (BID) | ORAL | Status: DC
  Administered 2012-09-04 – 2012-09-07 (×7): 100 mg via ORAL
  Filled 2012-09-04 (×7): qty 1

## 2012-09-04 MED ORDER — INSULIN ASPART 100 UNIT/ML ~~LOC~~ SOLN: 0.0000 [IU] | Freq: Every day | SUBCUTANEOUS | Status: DC

## 2012-09-04 MED ORDER — LEVOTHYROXINE SODIUM 50 MCG PO TABS
50.0000 ug | ORAL_TABLET | Freq: Every day | ORAL | Status: DC
  Administered 2012-09-05 – 2012-09-07 (×4): 50 ug via ORAL
  Filled 2012-09-04 (×5): qty 1

## 2012-09-04 MED ORDER — PIPERACILLIN-TAZOBACTAM 3.375 G IVPB 30 MIN
3.3750 g | Freq: Three times a day (TID) | INTRAVENOUS | Status: DC
  Administered 2012-09-05: 3.375 g via INTRAVENOUS
  Filled 2012-09-04 (×2): qty 50

## 2012-09-04 MED ORDER — PREDNISONE 1 MG PO TABS
2.0000 mg | ORAL_TABLET | Freq: Every day | ORAL | Status: DC
  Administered 2012-09-05 – 2012-09-07 (×3): 2 mg via ORAL
  Filled 2012-09-04 (×3): qty 2

## 2012-09-04 MED ORDER — HEPARIN SODIUM (PORCINE) 5000 UNIT/ML IJ SOLN
5000.0000 [IU] | Freq: Three times a day (TID) | INTRAMUSCULAR | Status: DC
  Administered 2012-09-04 – 2012-09-07 (×9): 5000 [IU] via SUBCUTANEOUS
  Filled 2012-09-04 (×11): qty 1

## 2012-09-04 MED ORDER — SULFAMETHOXAZOLE-TMP DS 800-160 MG PO TABS
1.0000 | ORAL_TABLET | ORAL | Status: DC
  Administered 2012-09-05 – 2012-09-07 (×2): 1 via ORAL
  Filled 2012-09-04 (×2): qty 1

## 2012-09-04 MED ORDER — ALBUTEROL SULFATE (5 MG/ML) 0.5% IN NEBU: 2.5000 mg | INHALATION_SOLUTION | RESPIRATORY_TRACT | Status: DC | PRN

## 2012-09-04 MED ORDER — PIPERACILLIN-TAZOBACTAM 3.375 G IVPB
3.3750 g | Freq: Once | INTRAVENOUS | Status: AC
  Administered 2012-09-04: 3.375 g via INTRAVENOUS
  Filled 2012-09-04: qty 50

## 2012-09-04 MED ORDER — VANCOMYCIN HCL 10 G IV SOLR
1500.0000 mg | INTRAVENOUS | Status: DC
  Administered 2012-09-05 – 2012-09-07 (×3): 1500 mg via INTRAVENOUS
  Filled 2012-09-04 (×4): qty 1500

## 2012-09-04 MED ORDER — INSULIN ASPART 100 UNIT/ML ~~LOC~~ SOLN
5.0000 [IU] | Freq: Once | SUBCUTANEOUS | Status: AC
  Administered 2012-09-04: 5 [IU] via SUBCUTANEOUS

## 2012-09-04 MED ORDER — SODIUM CHLORIDE 0.9 % IV BOLUS (SEPSIS)
1000.0000 mL | Freq: Once | INTRAVENOUS | Status: AC
  Administered 2012-09-04: 1000 mL via INTRAVENOUS

## 2012-09-04 MED ORDER — INSULIN ASPART 100 UNIT/ML ~~LOC~~ SOLN
0.0000 [IU] | Freq: Three times a day (TID) | SUBCUTANEOUS | Status: DC
  Administered 2012-09-05: 7 [IU] via SUBCUTANEOUS
  Administered 2012-09-05: 20 [IU] via SUBCUTANEOUS
  Administered 2012-09-05: 4 [IU] via SUBCUTANEOUS
  Administered 2012-09-06: 3 [IU] via SUBCUTANEOUS
  Administered 2012-09-06: 7 [IU] via SUBCUTANEOUS
  Administered 2012-09-06 – 2012-09-07 (×2): 4 [IU] via SUBCUTANEOUS

## 2012-09-04 MED ORDER — VALGANCICLOVIR HCL 450 MG PO TABS
900.0000 mg | ORAL_TABLET | Freq: Every day | ORAL | Status: DC
  Administered 2012-09-04 – 2012-09-07 (×4): 900 mg via ORAL
  Filled 2012-09-04 (×4): qty 2

## 2012-09-04 MED ORDER — ACETAMINOPHEN 325 MG PO TABS
975.0000 mg | ORAL_TABLET | Freq: Once | ORAL | Status: AC
  Administered 2012-09-04: 975 mg via ORAL
  Filled 2012-09-04: qty 3

## 2012-09-04 MED ORDER — AMLODIPINE BESYLATE 10 MG PO TABS
10.0000 mg | ORAL_TABLET | Freq: Two times a day (BID) | ORAL | Status: DC
  Administered 2012-09-04 – 2012-09-07 (×7): 10 mg via ORAL
  Filled 2012-09-04 (×8): qty 1

## 2012-09-04 MED ORDER — PNEUMOCOCCAL VAC POLYVALENT 25 MCG/0.5ML IJ INJ
0.5000 mL | INJECTION | INTRAMUSCULAR | Status: AC
  Administered 2012-09-05: 0.5 mL via INTRAMUSCULAR
  Filled 2012-09-04: qty 0.5

## 2012-09-04 MED ORDER — ONDANSETRON HCL 4 MG PO TABS
4.0000 mg | ORAL_TABLET | Freq: Four times a day (QID) | ORAL | Status: DC | PRN
  Administered 2012-09-07: 4 mg via ORAL
  Filled 2012-09-04: qty 1

## 2012-09-04 MED ORDER — ASPIRIN EC 81 MG PO TBEC
81.0000 mg | DELAYED_RELEASE_TABLET | Freq: Every day | ORAL | Status: DC
  Administered 2012-09-05 – 2012-09-07 (×3): 81 mg via ORAL
  Filled 2012-09-04 (×3): qty 1

## 2012-09-04 MED ORDER — SODIUM CHLORIDE 0.9 % IJ SOLN
3.0000 mL | Freq: Two times a day (BID) | INTRAMUSCULAR | Status: DC
  Administered 2012-09-05 – 2012-09-07 (×3): 3 mL via INTRAVENOUS

## 2012-09-04 MED ORDER — ALLOPURINOL 100 MG PO TABS
100.0000 mg | ORAL_TABLET | Freq: Every day | ORAL | Status: DC
  Administered 2012-09-05: 12:00:00 via ORAL
  Administered 2012-09-06 – 2012-09-07 (×2): 100 mg via ORAL
  Filled 2012-09-04 (×3): qty 1

## 2012-09-04 MED ORDER — MAGNESIUM OXIDE 400 (241.3 MG) MG PO TABS
400.0000 mg | ORAL_TABLET | Freq: Three times a day (TID) | ORAL | Status: DC
  Administered 2012-09-04 – 2012-09-07 (×9): 400 mg via ORAL
  Filled 2012-09-04 (×10): qty 1

## 2012-09-04 MED ORDER — MAGNESIUM OXIDE 400 MG PO TABS: 400.0000 mg | ORAL_TABLET | Freq: Three times a day (TID) | ORAL | Status: DC

## 2012-09-04 MED ORDER — SODIUM CHLORIDE 0.9 % IV SOLN
Freq: Once | INTRAVENOUS | Status: AC
  Administered 2012-09-04: 20:00:00 via INTRAVENOUS

## 2012-09-04 MED ORDER — ONDANSETRON HCL 4 MG/2ML IJ SOLN
4.0000 mg | Freq: Four times a day (QID) | INTRAMUSCULAR | Status: DC | PRN
  Administered 2012-09-05 – 2012-09-07 (×2): 4 mg via INTRAVENOUS
  Filled 2012-09-04 (×2): qty 2

## 2012-09-04 MED ORDER — TACROLIMUS 1 MG PO CAPS
5.0000 mg | ORAL_CAPSULE | Freq: Two times a day (BID) | ORAL | Status: DC
  Administered 2012-09-04 – 2012-09-07 (×6): 5 mg via ORAL
  Filled 2012-09-04 (×8): qty 5

## 2012-09-04 MED ORDER — SPIRONOLACTONE 50 MG PO TABS
50.0000 mg | ORAL_TABLET | Freq: Every day | ORAL | Status: DC
  Administered 2012-09-05 – 2012-09-07 (×3): 50 mg via ORAL
  Filled 2012-09-04 (×3): qty 1

## 2012-09-04 MED ORDER — VANCOMYCIN HCL 10 G IV SOLR
1500.0000 mg | Freq: Once | INTRAVENOUS | Status: AC
  Administered 2012-09-04: 1500 mg via INTRAVENOUS
  Filled 2012-09-04: qty 1500

## 2012-09-04 MED ORDER — VANCOMYCIN HCL 500 MG IV SOLR
500.0000 mg | INTRAVENOUS | Status: AC
  Administered 2012-09-04: 500 mg via INTRAVENOUS
  Filled 2012-09-04: qty 500

## 2012-09-04 MED ORDER — FLUOXETINE HCL 20 MG PO CAPS
20.0000 mg | ORAL_CAPSULE | Freq: Every day | ORAL | Status: DC
  Administered 2012-09-05 – 2012-09-07 (×3): 20 mg via ORAL
  Filled 2012-09-04 (×3): qty 1

## 2012-09-04 MED ORDER — CARVEDILOL 25 MG PO TABS
25.0000 mg | ORAL_TABLET | Freq: Two times a day (BID) | ORAL | Status: DC
  Administered 2012-09-04 – 2012-09-07 (×7): 25 mg via ORAL
  Filled 2012-09-04 (×7): qty 1

## 2012-09-04 MED ORDER — INSULIN NPH (HUMAN) (ISOPHANE) 100 UNIT/ML ~~LOC~~ SUSP
28.0000 [IU] | Freq: Every day | SUBCUTANEOUS | Status: DC
  Administered 2012-09-04: 28 [IU] via SUBCUTANEOUS
  Filled 2012-09-04 (×2): qty 10

## 2012-09-04 NOTE — ED Provider Notes (Signed)
History     CSN: [id number]  Arrival date & time 09/04/12  1404   First MD Initiated Contact with Patient 09/04/12 1445      Chief Complaint  Patient presents with  . Generalized Body Aches  . Fever    (Consider location/radiation/quality/duration/timing/severity/associated sxs/prior treatment) HPI Comments: Patient is a 51 year old female with a past medical history of renal transplant in August 2013 at [facility], hypertension, and diabetes who presents with a 1 day history of a fever and generalized body aches. The symptoms started gradually and progressively worsened since the onset. The patient reports a fever as high as 103F last night. Patient took tylenol for fever which provided some relief. Patient was advised by her transplant doctor to come to the ED to be checked out. No aggravating/alleviating factors. Associated symptoms include SOB. Patient denies cough, headache, sore throat, congestion, chest pain, abdominal pain, NVD, dysuria.     Past Medical History  Diagnosis Date  . Hyperlipidemia   . Unspecified essential hypertension   . Shingles   . Dyspnea   . History of migraines   . Obesity   . Hypothyroidism   . Diabetes mellitus type II   . Chronic kidney disease, stage IV (severe)   . Dyspnea on exertion   . Nonspecific abnormal unspecified cardiovascular function study   . Foot pain   . Anemia     Past Surgical History  Procedure Date  . Laparoscopic cholecystectomy w/ cholangiography   . Av fistula placement, radiocephalic     right  . Cesarean section   . Endometrial cryoablation   . Colonoscopy   . Cholecystectomy   . Varicose vein removal   . Av fistula placement   . Transplantation renal     Family History  Problem Relation Age of Onset  . Colon cancer Father     deceased at age 72    History  Substance Use Topics  . Smoking status: Never Smoker   . Smokeless tobacco: Not on file  . Alcohol Use: No    OB History    Grav Para Term Preterm  Abortions TAB SAB Ect Mult Living                  Review of Systems  Constitutional: Positive for fever.  Musculoskeletal: Positive for myalgias.  All other systems reviewed and are negative.    Allergies  Hydrocodone-acetaminophen and Propoxyphene-acetaminophen  Home Medications   Current Outpatient Rx  Name  Route  Sig  Dispense  Refill  . ALLOPURINOL 100 MG PO TABS   Oral   Take 100 mg by mouth daily.         [name] Kitchen AMLODIPINE BESYLATE 10 MG PO TABS   Oral   Take 10 mg by mouth 2 (two) times daily.          . ASPIRIN EC 81 MG PO TBEC   Oral   Take 81 mg by mouth daily.         [name] Kitchen CARVEDILOL 25 MG PO TABS   Oral   Take 25 mg by mouth 2 (two) times daily.           . ERGOCALCIFEROL 50000 UNITS PO CAPS   Oral   Take 50,000 Units by mouth every 30 (thirty) days.          [name] Kitchen FLUOXETINE HCL 20 MG PO CAPS   Oral   Take 20 mg by mouth daily.          [name] Kitchen  FUROSEMIDE 80 MG PO TABS   Oral   Take 80 mg by mouth 2 (two) times daily.          [name] Kitchen GABAPENTIN 100 MG PO CAPS   Oral   Take 100 mg by mouth 2 (two) times daily.          . INSULIN ISOPHANE HUMAN 100 UNIT/ML [place] SUSP   Subcutaneous   Inject 28 Units into the skin at bedtime.          . INSULIN REGULAR HUMAN 100 UNIT/ML IJ SOLN   Subcutaneous   Inject 24-28 Units into the skin 3 (three) times daily before meals. 28 units in the morning, 24 units at noon, and 24 units at 5pm.         . LEVOTHYROXINE SODIUM 50 MCG PO TABS   Oral   Take 50 mcg by mouth daily.          [name] Kitchen MAGNESIUM OXIDE 400 MG PO TABS   Oral   Take 400 mg by mouth 3 (three) times daily.         [name] Kitchen PREDNISONE 1 MG PO TABS   Oral   Take 2 mg by mouth daily.         [name] Kitchen SPIRONOLACTONE 50 MG PO TABS   Oral   Take 50 mg by mouth daily.           [name] DS 800-160 MG PO TABS   Oral   Take 1 tablet by mouth every Monday, Wednesday, and Friday.         [name] Kitchen TACROLIMUS 1 MG PO CAPS   Oral   Take 5 mg by  mouth 2 (two) times daily.          . TRAMADOL HCL 50 MG PO TABS   Oral   Take 50 mg by mouth at bedtime. For pain         . VALGANCICLOVIR HCL 450 MG PO TABS   Oral   Take 900 mg by mouth daily.           BP 173/67  Pulse 90  Temp 100.1 F (37.8 C) (Oral)  Resp 20  SpO2 97%  Physical Exam  Nursing note and vitals reviewed. Constitutional: She is oriented to person, place, and time. She appears well-developed and well-nourished. No distress.  HENT:  Head: Normocephalic and atraumatic.  Mouth/Throat: Oropharynx is clear and moist. No oropharyngeal exudate.  Eyes: Conjunctivae normal and EOM are normal. Pupils are equal, round, and reactive to light.  Neck: Normal range of motion. Neck supple.  Cardiovascular: Normal rate and regular rhythm.  Exam reveals no gallop and no friction rub.   No murmur heard. Pulmonary/Chest: Effort normal and breath sounds normal. She has no wheezes. She has no rales. She exhibits no tenderness.  Abdominal: Soft. She exhibits no distension. There is no tenderness. There is no rebound and no guarding.  Musculoskeletal: Normal range of motion.  Neurological: She is alert and oriented to person, place, and time. Coordination normal.       Speech is goal-oriented. Moves limbs without ataxia.   Skin: Skin is warm and dry. She is not diaphoretic.  Psychiatric: She has a normal mood and affect. Her behavior is normal.    ED Course  Procedures (including critical care time)  Labs Reviewed  URINALYSIS, ROUTINE W REFLEX MICROSCOPIC - Abnormal; Notable for the following:    APPearance HAZY (*)     Glucose, UA 250 (*)  All other components within normal limits  CBC WITH DIFFERENTIAL - Abnormal; Notable for the following:    RBC 3.77 (*)     Hemoglobin 10.3 (*)     HCT 31.9 (*)     Neutrophils Relative 89 (*)     Lymphocytes Relative 8 (*)     All other components within normal limits  BASIC METABOLIC PANEL - Abnormal; Notable for the  following:    Sodium 128 (*)     Glucose, Bld 321 (*)     BUN 28 (*)     Creatinine, Ser 1.75 (*)     GFR calc non Af Amer 32 (*)     GFR calc Af Amer 37 (*)     All other components within normal limits  CULTURE, BLOOD (ROUTINE X 2)  CULTURE, BLOOD (ROUTINE X 2)   Dg Chest 2 View  09/04/2012  *RADIOLOGY REPORT*  Clinical Data: Shortness of breath, fever.  CHEST - 2 VIEW  Comparison: 02/07/2012  Findings:   Bilateral airspace opacities are noted, right greater than left.  The opacities are somewhat nodular, particularly in the left mid and upper lung.  Given the patient is febrile, I favor this is multifocal pneumonia.  The nodular appearance of this suggests possibility of other entities such as septic emboli. Neoplasm is less likely but not completely excluded.  Heart is normal size.  No effusions.  No acute bony abnormality.  IMPRESSION:  Patchy bilateral airspace disease, right greater than left.  This has a somewhat nodular and atypical appearance.  I suspect findings are related to multifocal pneumonia, but other entities such as septic emboli and neoplasm cannot be completely excluded. Recommend follow up after treatment to assure resolution.   Original Report Authenticated By: Rolm Baptise, M.D.      1. Sepsis       MDM  3:06 PM Basic labs and chest xray pending. Urinalysis shows no infection. Patient will have tylenol for fever.  3:50 PM Chest xray shows bilateral patchy infiltrates concerning for pneumonia. Patient will have blood cultures drawn and be started on Vanc and Zosyn. I will consult hospitalist for admission.   5:03 PM Patient will be admitted.    Alvina Chou, PA-C 09/04/12 1703

## 2012-09-04 NOTE — ED Notes (Signed)
States had  A kidney transplant in August from anon donor and last night she startes to runfever of 102- 103. grandaughter had flu last week and  Came to see pt on sat . ? Having body aches . Pt states is congested and sob

## 2012-09-04 NOTE — Progress Notes (Addendum)
[facility] History and Physical  [name] [id number] DOB: Jul 17, 1961 DOA: 09/04/2012  Referring physician: er PCP: Myriam Jacobson, MD  Specialists:   Chief Complaint: fever, cough, congestion  HPI: Wanda [name] is a 51 y.o. female  Who had a renal transplant in August at [facility].  She has been feeling well up to a day ago when she developed fever, cough and congestion.  He granddaughter was diagnosed with the flu last week and received oral medication for this.  She fever went as high as 103- she took tylenol and it decreased some.  She called her transplant dr at [facility] and was told to come to the ER.  +SOB, no CP, no HA, no abdominal pain  In the ER she was found on chest x ray to have a PNA.  A flu titer was ordered.   Review of Systems: all systems reviewed, negative unless stated above   Past Medical History  Diagnosis Date  . Hyperlipidemia   . Unspecified essential hypertension   . Shingles   . Dyspnea   . History of migraines   . Obesity   . Hypothyroidism   . Diabetes mellitus type II   . Chronic kidney disease, stage IV (severe)   . Dyspnea on exertion   . Nonspecific abnormal unspecified cardiovascular function study   . Foot pain   . Anemia    Past Surgical History  Procedure Date  . Laparoscopic cholecystectomy w/ cholangiography   . Av fistula placement, radiocephalic     right  . Cesarean section   . Endometrial cryoablation   . Colonoscopy   . Cholecystectomy   . Varicose vein removal   . Av fistula placement   . Transplantation renal    Social History:  reports that she has never smoked. She does not have any smokeless tobacco history on file. She reports that she does not drink alcohol or use illicit drugs.   Allergies  Allergen Reactions  . Hydrocodone-Acetaminophen Other (See Comments)    REACTION: headaches  . Propoxyphene-Acetaminophen Other (See Comments)    REACTION: headache    Family History  Problem  Relation Age of Onset  . Colon cancer Father     deceased at age 50    Prior to Admission medications   Medication Sig Start Date End Date Taking? Authorizing Provider  allopurinol (ZYLOPRIM) 100 MG tablet Take 100 mg by mouth daily.   Yes Historical Provider, MD  amLODipine (NORVASC) 10 MG tablet Take 10 mg by mouth 2 (two) times daily.    Yes Historical Provider, MD  aspirin EC 81 MG tablet Take 81 mg by mouth daily.   Yes Historical Provider, MD  carvedilol (COREG) 25 MG tablet Take 25 mg by mouth 2 (two) times daily.     Yes Historical Provider, MD  ergocalciferol (VITAMIN D2) 50000 UNITS capsule Take 50,000 Units by mouth every 30 (thirty) days.    Yes Historical Provider, MD  FLUoxetine (PROZAC) 20 MG capsule Take 20 mg by mouth daily.  05/26/11  Yes Historical Provider, MD  furosemide (LASIX) 80 MG tablet Take 80 mg by mouth 2 (two) times daily.    Yes Historical Provider, MD  gabapentin (NEURONTIN) 100 MG capsule Take 100 mg by mouth 2 (two) times daily.  05/20/11  Yes Historical Provider, MD  insulin NPH (HUMULIN N,NOVOLIN N) 100 UNIT/ML injection Inject 28 Units into the skin at bedtime.    Yes Historical Provider, MD  insulin regular (NOVOLIN R,HUMULIN  R) 100 units/mL injection Inject 24-28 Units into the skin 3 (three) times daily before meals. 28 units in the morning, 24 units at noon, and 24 units at 5pm.   Yes Historical Provider, MD  levothyroxine (SYNTHROID) 50 MCG tablet Take 50 mcg by mouth daily.    Yes Historical Provider, MD  magnesium oxide (MAG-OX) 400 MG tablet Take 400 mg by mouth 3 (three) times daily.   Yes Historical Provider, MD  predniSONE (DELTASONE) 1 MG tablet Take 2 mg by mouth daily.   Yes Historical Provider, MD  spironolactone (ALDACTONE) 50 MG tablet Take 50 mg by mouth daily.     Yes Historical Provider, MD  sulfamethoxazole-trimethoprim (BACTRIM DS) 800-160 MG per tablet Take 1 tablet by mouth every Monday, Wednesday, and Friday.   Yes Historical Provider,  MD  tacrolimus (PROGRAF) 1 MG capsule Take 5 mg by mouth 2 (two) times daily.    Yes Historical Provider, MD  traMADol (ULTRAM) 50 MG tablet Take 50 mg by mouth at bedtime. For pain 02/07/12  Yes Kathalene Frames, MD  valGANciclovir (VALCYTE) 450 MG tablet Take 900 mg by mouth daily.   Yes Historical Provider, MD   Physical Exam: Filed Vitals:   09/04/12 1408 09/04/12 1623 09/04/12 1645  BP: 173/67 178/74   Pulse: 90 86   Temp: 100.1 F (37.8 C)  99.6 F (37.6 C)  TempSrc: Oral  Oral  Resp: 20 19   SpO2: 97% 99%      General:  A+Ox3, NAD  Eyes: wnl  ENT: mucous membranes moist  Neck: supple, no JVD  Cardiovascular: rrr  Respiratory: ronchi B/l posterior  Abdomen: +BS, soft, NT/ND  Skin: no rashes or lesions, old scars on arm  Musculoskeletal: moves all 4 extremities  Psychiatric: no SI/no HI  Neurologic: CN 2-12 intact  Labs on Admission:  Basic Metabolic Panel:  Lab [id number] 1520  NA 128*  K 5.1  CL 97  CO2 21  GLUCOSE 321*  BUN 28*  CREATININE 1.75*  CALCIUM 8.7  MG --  PHOS --   Liver Function Tests: No results found for this basename: AST:5,ALT:5,ALKPHOS:5,BILITOT:5,PROT:5,ALBUMIN:5 in the last 168 hours No results found for this basename: LIPASE:5,AMYLASE:5 in the last 168 hours No results found for this basename: AMMONIA:5 in the last 168 hours CBC:  Lab 09/04/12 1520  WBC 7.9  NEUTROABS 7.0  HGB 10.3*  HCT 31.9*  MCV 84.6  PLT 178   Cardiac Enzymes: No results found for this basename: CKTOTAL:5,CKMB:5,CKMBINDEX:5,TROPONINI:5 in the last 168 hours  BNP (last 3 results) No results found for this basename: PROBNP:3 in the last 8760 hours CBG: No results found for this basename: GLUCAP:5 in the last 168 hours  Radiological Exams on Admission: Dg Chest 2 View  09/04/2012  *RADIOLOGY REPORT*  Clinical Data: Shortness of breath, fever.  CHEST - 2 VIEW  Comparison: 02/07/2012  Findings:   Bilateral airspace opacities are noted, right greater  than left.  The opacities are somewhat nodular, particularly in the left mid and upper lung.  Given the patient is febrile, I favor this is multifocal pneumonia.  The nodular appearance of this suggests possibility of other entities such as septic emboli. Neoplasm is less likely but not completely excluded.  Heart is normal size.  No effusions.  No acute bony abnormality.  IMPRESSION:  Patchy bilateral airspace disease, right greater than left.  This has a somewhat nodular and atypical appearance.  I suspect findings are related to multifocal pneumonia, but other  entities such as septic emboli and neoplasm cannot be completely excluded. Recommend follow up after treatment to assure resolution.   Original Report Authenticated By: Rolm Baptise, M.D.       Assessment/Plan Principal Problem:  *Healthcare-associated pneumonia Active Problems:  DIABETES MELLITUS, TYPE II  OBESITY  Renal transplant disorder  Hyponatremia  CKD (chronic kidney disease)  HTN (hypertension)  Anemia   1. HCAPNA- will need x-ray follow up and depending on clinical improvement may need CT scan as x ray was read as: ?multifocal pna or septic emboli or neoplasm.  Vanc/zosyn for now- ask pharmacy to dose 2. Renal transplant continue home medications, will ask nephro to see in AM, recheck Cr- only mildly elevated and U/A looks ok.  progaf level pending 3. DM- ask diabetic coordinator to see, resume home medications 4. Anemia- related to CKD/transplant 5. Obesity- encourage weight loss 6. Hyponatremia- appears dehydrated 7. Febrile illness- r/o flu, could be from PNA, blood cultures done 8. HTN- continue home medications    Code Status: full Family Communication: patient at bedside Disposition Plan: 2-3 days  Time spent: 70 min  Rishikesh Khachatryan [facility] Pager [phone]  If 7PM-7AM, please contact night-coverage [email or web address] Password [email or web address] 09/04/2012, 5:42 PM

## 2012-09-04 NOTE — ED Provider Notes (Signed)
Medical screening examination/treatment/procedure(s) were performed by non-physician practitioner and as supervising physician I was immediately available for consultation/collaboration.   Charles B. Karle Starch, MD 09/04/12 [id number]

## 2012-09-04 NOTE — Progress Notes (Signed)
Received pt. From ED,pt. Alert,oriented,independent.pt. Is from home,she does not uses any equipment to ambulate.pt. Was place in telemetry box # [id number].keep monitoring pt. Closely and assessing her needs.

## 2012-09-04 NOTE — ED Notes (Signed)
Pt states she had a kidney transplant in august and wants to be checked out because she started running a fever last night and has been having generalized body aches. Fever of 102 and 103 last night at home. Took tylenol for the fever. Last dose was at 0800 this morning.

## 2012-09-04 NOTE — Progress Notes (Signed)
ANTIBIOTIC CONSULT NOTE - INITIAL  Pharmacy Consult:  Vancomycin / Zosyn Indication:  PNA  Allergies  Allergen Reactions  . Hydrocodone-Acetaminophen Other (See Comments)    REACTION: headaches  . Propoxyphene-Acetaminophen Other (See Comments)    REACTION: headache    Patient Measurements: Height: 5' 10.08" (178 cm) Weight: 259 lb 14.8 oz (117.9 kg) IBW/kg (Calculated) : 68.68   Vital Signs: Temp: 99.6 F (37.6 C) (12/03 1645) Temp src: Oral (12/03 1645) BP: 178/74 mmHg (12/03 1623) Pulse Rate: 86  (12/03 1623)  Labs:  Basename 09/04/12 1520  WBC 7.9  HGB 10.3*  PLT 178  LABCREA --  CREATININE 1.75*   Estimated Creatinine Clearance: 52.5 ml/min (by C-G formula based on Cr of 1.75). No results found for this basename: VANCOTROUGH:2,VANCOPEAK:2,VANCORANDOM:2,GENTTROUGH:2,GENTPEAK:2,GENTRANDOM:2,TOBRATROUGH:2,TOBRAPEAK:2,TOBRARND:2,AMIKACINPEAK:2,AMIKACINTROU:2,AMIKACIN:2, in the last 72 hours   Microbiology: No results found for this or any previous visit (from the past 720 hour(s)).  Medical History: Past Medical History  Diagnosis Date  . Hyperlipidemia   . Unspecified essential hypertension   . Shingles   . Dyspnea   . History of migraines   . Obesity   . Hypothyroidism   . Diabetes mellitus type II   . Chronic kidney disease, stage IV (severe)   . Dyspnea on exertion   . Nonspecific abnormal unspecified cardiovascular function study   . Foot pain   . Anemia   . Renal transplant disorder        Assessment: Wanda [name] admitted with chief complaints of fever, cough and congestion.  She was recently exposed to someone who was diagnosed with the flu.  She also has a history of renal transplant last August.  Pharmacy consulted to manage vancomycin and Zosyn for PNA seen on CXR.  Noted vancomycin 1500mg  IV x 1 and Zosyn 3.375gm IV x 1 given earlier this afternoon.   Goal of Therapy:  Vancomycin trough level 15-20 mcg/ml    Plan:  - Give an additional  500mg  of vanc (total of 2gm load), then 1500mg  IV Q24H - Zosyn 3.375gm IV Q8H, 4 hr infusion - Monitor renal function, clinical course, vanc trough if indicated    Peityn Payton D. Mina Marble, PharmD, BCPS Pager:  [phone] 09/04/2012, 6:07 PM

## 2012-09-04 NOTE — ED Notes (Signed)
[id number] dr Darrin Luis, please call when edp has evaluated, this is the kidney transplant doctor on call

## 2012-09-05 LAB — BASIC METABOLIC PANEL
BUN: 29 mg/dL — ABNORMAL HIGH (ref 6–23)
CO2: 21 mEq/L (ref 19–32)
Calcium: 8.5 mg/dL (ref 8.4–10.5)
Chloride: 103 mEq/L (ref 96–112)
Creatinine, Ser: 1.55 mg/dL — ABNORMAL HIGH (ref 0.50–1.10)
GFR calc Af Amer: 43 mL/min — ABNORMAL LOW (ref >90)
GFR calc non Af Amer: 37 mL/min — ABNORMAL LOW (ref >90)
Glucose, Bld: 399 mg/dL — ABNORMAL HIGH (ref 70–99)
Potassium: 4.7 mEq/L (ref 3.5–5.1)
Sodium: 132 mEq/L — ABNORMAL LOW (ref 135–145)

## 2012-09-05 LAB — CBC
HCT: 28.5 % — ABNORMAL LOW (ref 36.0–46.0)
Hemoglobin: 9.3 g/dL — ABNORMAL LOW (ref 12.0–15.0)
MCH: 27.4 pg (ref 26.0–34.0)
MCHC: 32.6 g/dL (ref 30.0–36.0)
MCV: 84.1 fL (ref 78.0–100.0)
Platelets: 166 10*3/uL (ref 150–400)
RBC: 3.39 MIL/uL — ABNORMAL LOW (ref 3.87–5.11)
RDW: 14.2 % (ref 11.5–15.5)
WBC: 5.4 10*3/uL (ref 4.0–10.5)

## 2012-09-05 LAB — INFLUENZA PANEL BY PCR (TYPE A & B)
H1N1 flu by pcr: NOT DETECTED
Influenza A By PCR: NEGATIVE
Influenza B By PCR: NEGATIVE

## 2012-09-05 LAB — MRSA PCR SCREENING: MRSA by PCR: NEGATIVE

## 2012-09-05 LAB — GLUCOSE, CAPILLARY
Glucose-Capillary: 366 mg/dL — ABNORMAL HIGH (ref 70–99)
Glucose-Capillary: 386 mg/dL — ABNORMAL HIGH (ref 70–99)
Glucose-Capillary: 234 mg/dL — ABNORMAL HIGH (ref 70–99)
Glucose-Capillary: 194 mg/dL — ABNORMAL HIGH (ref 70–99)
Glucose-Capillary: 81 mg/dL (ref 70–99)

## 2012-09-05 LAB — HEMOGLOBIN A1C
Hgb A1c MFr Bld: 12.8 % — ABNORMAL HIGH (ref <5.7)
Mean Plasma Glucose: 321 mg/dL — ABNORMAL HIGH (ref <117)

## 2012-09-05 MED ORDER — ACETAMINOPHEN 325 MG PO TABS
650.0000 mg | ORAL_TABLET | ORAL | Status: DC | PRN
  Administered 2012-09-05 – 2012-09-07 (×5): 650 mg via ORAL
  Filled 2012-09-05 (×5): qty 2

## 2012-09-05 MED ORDER — INSULIN NPH (HUMAN) (ISOPHANE) 100 UNIT/ML ~~LOC~~ SUSP
32.0000 [IU] | Freq: Every day | SUBCUTANEOUS | Status: DC
  Administered 2012-09-05 – 2012-09-07 (×3): 32 [IU] via SUBCUTANEOUS

## 2012-09-05 MED ORDER — MYCOPHENOLATE SODIUM 180 MG PO TBEC
720.0000 mg | DELAYED_RELEASE_TABLET | Freq: Two times a day (BID) | ORAL | Status: DC
  Administered 2012-09-05 – 2012-09-07 (×4): 720 mg via ORAL
  Filled 2012-09-05 (×7): qty 4

## 2012-09-05 MED ORDER — INSULIN ASPART 100 UNIT/ML ~~LOC~~ SOLN
20.0000 [IU] | Freq: Three times a day (TID) | SUBCUTANEOUS | Status: DC
  Administered 2012-09-05 – 2012-09-06 (×4): 20 [IU] via SUBCUTANEOUS

## 2012-09-05 MED ORDER — TRAMADOL HCL 50 MG PO TABS
50.0000 mg | ORAL_TABLET | Freq: Four times a day (QID) | ORAL | Status: DC | PRN
  Administered 2012-09-05 – 2012-09-06 (×2): 50 mg via ORAL
  Filled 2012-09-05 (×2): qty 1

## 2012-09-05 MED ORDER — PIPERACILLIN-TAZOBACTAM 3.375 G IVPB
3.3750 g | Freq: Three times a day (TID) | INTRAVENOUS | Status: DC
  Administered 2012-09-05 – 2012-09-07 (×7): 3.375 g via INTRAVENOUS
  Filled 2012-09-05 (×10): qty 50

## 2012-09-05 NOTE — Progress Notes (Signed)
Utilization review completed.  

## 2012-09-05 NOTE — Progress Notes (Signed)
Inpatient Diabetes Program Recommendations  AACE/ADA: New Consensus Statement on Inpatient Glycemic Control (2013)  Target Ranges:  Prepandial:   less than 140 mg/dL      Peak postprandial:   less than 180 mg/dL (1-2 hours)      Critically ill patients:  140 - 180 mg/dL   Reason for Visit: Results for Wanda [name], Wanda [name] (MRN [id number]) as of 09/05/2012 13:06  Ref. Range 07/17/2012 19:53 09/04/2012 18:40 09/04/2012 20:40 09/05/2012 02:47 09/05/2012 07:35  Glucose-Capillary Latest Range: 70-99 mg/dL 384 (H) 368 (H) 473 (H) 366 (H) 386 (H)   Results for Wanda [name], Wanda [name] (MRN [id number]) as of 09/05/2012 13:06  Ref. Range 09/04/2012 20:35  Hemoglobin A1C Latest Range: <5.7 % 12.8 (H)   Referral received.  Spoke to patient at length.  She has had diabetes since age 70.  She underwent renal transplant in August, 2013 and her diabetes is managed by MD's at [facility].  Her home diabetes regimen is NPH 28 units q HS and Regular 28 units with breakfast, 24 units at noon and 24 units at 5 PM.  She states that her CBG's were well controlled while she was in the hospital in August however they have not been well controlled since.  A1C is 12.8% indicating poor control.  She states she has appointment with dietician next week on Monday regarding possible Lap band procedure.  Explained importance of glycemic control and instructed her to talk to her Dr's regarding her high blood sugars.  She may benefit from AM dose of NPH also to get more 24 hour coverage.   Discussed with Dr. Marye Round.

## 2012-09-05 NOTE — Progress Notes (Signed)
[facility] PROGRESS NOTE  Wanda [name] [id number] DOB: December 13, 1960 DOA: 09/04/2012 PCP: Myriam Jacobson, MD  Assessment/Plan: 1. HCAPNA- vs atypical pneumonia vs viral syndrome - patient will need x-ray follow up to assure clearance. Patient was started on  Vanc/zosyn from admission  Influenza panel was negative. Fever gone by 09/05/12 - patient on appropriate prophylaxis against PCP and CMV.  2. Renal transplant with admission creatinine at 1.7 - continue on home immunosuppressants.  d/w attending nephrologist Dr. Mercy Moore, recheck Cr- daily . Creatinine improved to 1.5 by 09/05/12. progaf level pending 3. DM type 2 - uncontrolled increased NPH, resumed meal time Novolog.  4. Anemia- related to CKD/transplant 5. Obesity- encourage weight loss 6. Hyponatremia- appears dehydrated - improved with iv fluids overnight.  7. Febrile illness- r/o flu, could be from PNA, blood cultures done 8. HTN- continue home medications     Code Status: full Family Communication: patient  Disposition Plan: home     Procedures:  none  Antibiotics: Vanc and Zosyn   HPI/Subjective: Feels great   Objective: Filed Vitals:   09/04/12 2059 09/05/12 0540 09/05/12 0700 09/05/12 0837  BP: 145/68 156/91  123/64  Pulse: 80 84  89  Temp: 98.2 F (36.8 C) 98.2 F (36.8 C) 98 F (36.7 C) 98.7 F (37.1 C)  TempSrc: Oral Oral  Oral  Resp: 18 17  18   Height:      Weight: 122.9 kg (270 lb 15.1 oz)     SpO2: 100% 100%  100%    Intake/Output Summary (Last 24 hours) at 09/05/12 1233 Last data filed at 09/05/12 1225  Gross per 24 hour  Intake    723 ml  Output      1 ml  Net    722 ml   Filed Weights   09/04/12 1700 09/04/12 2059  Weight: 117.9 kg (259 lb 14.8 oz) 122.9 kg (270 lb 15.1 oz)    Exam:   General:  axox3  Cardiovascular: RRR, nl S1,S2, no M, R,G  Respiratory: CTAB, no W, R, C  Abdomen: soft, nt, Bs present  Data Reviewed: Basic Metabolic Panel:  Lab  [id number] 09/04/12 2035 09/04/12 1520  NA 132* -- 128*  K 4.7 -- 5.1  CL 103 -- 97  CO2 21 -- 21  GLUCOSE 399* -- 321*  BUN 29* -- 28*  CREATININE 1.55* 1.77* 1.75*  CALCIUM 8.5 -- 8.7  MG -- -- --  PHOS -- -- --   Liver Function Tests: No results found for this basename: AST:5,ALT:5,ALKPHOS:5,BILITOT:5,PROT:5,ALBUMIN:5 in the last 168 hours No results found for this basename: LIPASE:5,AMYLASE:5 in the last 168 hours No results found for this basename: AMMONIA:5 in the last 168 hours CBC:  Lab 09/05/12 0657 09/04/12 2035 09/04/12 1520  WBC 5.4 6.2 7.9  NEUTROABS -- -- 7.0  HGB 9.3* 9.9* 10.3*  HCT 28.5* 31.2* 31.9*  MCV 84.1 84.8 84.6  PLT 166 175 178   Cardiac Enzymes: No results found for this basename: CKTOTAL:5,CKMB:5,CKMBINDEX:5,TROPONINI:5 in the last 168 hours BNP (last 3 results) No results found for this basename: PROBNP:3 in the last 8760 hours CBG:  Lab 09/05/12 0735 09/05/12 0247 09/04/12 2040 09/04/12 1840  GLUCAP 386* 366* 473* 368*    Recent Results (from the past 240 hour(s))  CULTURE, BLOOD (ROUTINE X 2)     Status: Normal (Preliminary result)   Collection Time   09/04/12  4:10 PM      Component Value Range Status Comment   Specimen Description  BLOOD LEFT ARM   Final    Special Requests BOTTLES DRAWN AEROBIC AND ANAEROBIC 10CC   Final    Culture  Setup Time 09/04/2012 23:06   Final    Culture     Final    Value:        BLOOD CULTURE RECEIVED NO GROWTH TO DATE CULTURE WILL BE HELD FOR 5 DAYS BEFORE ISSUING A FINAL NEGATIVE REPORT   Report Status PENDING   Incomplete   CULTURE, BLOOD (ROUTINE X 2)     Status: Normal (Preliminary result)   Collection Time   09/04/12  4:20 PM      Component Value Range Status Comment   Specimen Description BLOOD LEFT HAND   Final    Special Requests BOTTLES DRAWN AEROBIC AND ANAEROBIC 10CC   Final    Culture  Setup Time 09/04/2012 23:06   Final    Culture     Final    Value:        BLOOD CULTURE RECEIVED NO GROWTH  TO DATE CULTURE WILL BE HELD FOR 5 DAYS BEFORE ISSUING A FINAL NEGATIVE REPORT   Report Status PENDING   Incomplete   MRSA PCR SCREENING     Status: Normal   Collection Time   09/04/12 11:56 PM      Component Value Range Status Comment   MRSA by PCR NEGATIVE  NEGATIVE Final      Studies: Dg Chest 2 View  09/04/2012  *RADIOLOGY REPORT*  Clinical Data: Shortness of breath, fever.  CHEST - 2 VIEW  Comparison: 02/07/2012  Findings:   Bilateral airspace opacities are noted, right greater than left.  The opacities are somewhat nodular, particularly in the left mid and upper lung.  Given the patient is febrile, I favor this is multifocal pneumonia.  The nodular appearance of this suggests possibility of other entities such as septic emboli. Neoplasm is less likely but not completely excluded.  Heart is normal size.  No effusions.  No acute bony abnormality.  IMPRESSION:  Patchy bilateral airspace disease, right greater than left.  This has a somewhat nodular and atypical appearance.  I suspect findings are related to multifocal pneumonia, but other entities such as septic emboli and neoplasm cannot be completely excluded. Recommend follow up after treatment to assure resolution.   Original Report Authenticated By: Rolm Baptise, M.D.     Scheduled Meds:    . [COMPLETED] sodium chloride   Intravenous Once  . [COMPLETED] acetaminophen  975 mg Oral Once  . allopurinol  100 mg Oral Daily  . amLODipine  10 mg Oral BID  . aspirin EC  81 mg Oral Daily  . carvedilol  25 mg Oral BID  . FLUoxetine  20 mg Oral Daily  . gabapentin  100 mg Oral BID  . heparin  5,000 Units Subcutaneous Q8H  . insulin aspart  0-20 Units Subcutaneous TID WC  . insulin aspart  0-5 Units Subcutaneous QHS  . insulin aspart  20 Units Subcutaneous TID WC  . [COMPLETED] insulin aspart  5 Units Subcutaneous Once  . insulin NPH  32 Units Subcutaneous QHS  . levothyroxine  50 mcg Oral QAC breakfast  . magnesium oxide  400 mg Oral TID   . [COMPLETED] piperacillin-tazobactam (ZOSYN)  IV  3.375 g Intravenous Once  . piperacillin-tazobactam (ZOSYN)  IV  3.375 g Intravenous Q8H  . [COMPLETED] pneumococcal 23 valent vaccine  0.5 mL Intramuscular Tomorrow-1000  . predniSONE  2 mg Oral Daily  . [COMPLETED] sodium  chloride  1,000 mL Intravenous Once  . sodium chloride  3 mL Intravenous Q12H  . spironolactone  50 mg Oral Daily  . sulfamethoxazole-trimethoprim  1 tablet Oral Q M,W,F  . tacrolimus  5 mg Oral BID  . valGANciclovir  900 mg Oral Daily  . [COMPLETED] vancomycin  1,500 mg Intravenous Once  . vancomycin  1,500 mg Intravenous Q24H  . [COMPLETED] vancomycin  500 mg Intravenous NOW  . [DISCONTINUED] insulin NPH  28 Units Subcutaneous QHS  . [DISCONTINUED] magnesium oxide  400 mg Oral TID  . [DISCONTINUED] piperacillin-tazobactam  3.375 g Intravenous Q8H  . [DISCONTINUED] valGANciclovir  900 mg Oral Daily   Continuous Infusions:   Principal Problem:  *Healthcare-associated pneumonia Active Problems:  SHINGLES  HYPOTHYROIDISM  DIABETES MELLITUS, TYPE II  HYPERLIPIDEMIA  OBESITY  CHRONIC KIDNEY DISEASE STAGE IV (SEVERE)  Renal transplant disorder  Hyponatremia  CKD (chronic kidney disease)  HTN (hypertension)  Anemia      Mikia Delaluz  [facility] Pager [phone]. If 8PM-8AM, please contact night-coverage at [email or web address], password [facility] 09/05/2012, 12:33 PM  LOS: 1 day

## 2012-09-05 NOTE — Progress Notes (Signed)
Nutrition Brief Note  Patient identified on the Malnutrition Screening Tool (MST) Report. Upon further chart review, pt is eating well at this time and pt with stable weight. Nurse tech confirms pt is eating 100%.   Body mass index is 38.79 kg/(m^2). Pt meets criteria for Obese Class II based on current BMI.   Current diet order is Carbohydrate Modified Medium, patient is consuming approximately 100% of meals at this time. Labs and medications reviewed.   No nutrition interventions warranted at this time. If nutrition issues arise, please consult RD.   Inda Coke MS, RD, LDN Pager: [phone] After-hours pager: [phone]

## 2012-09-06 ENCOUNTER — Inpatient Hospital Stay (HOSPITAL_COMMUNITY): Payer: 59

## 2012-09-06 DIAGNOSIS — N189 Chronic kidney disease, unspecified: Secondary | ICD-10-CM

## 2012-09-06 DIAGNOSIS — R05 Cough: Secondary | ICD-10-CM

## 2012-09-06 DIAGNOSIS — R059 Cough, unspecified: Secondary | ICD-10-CM

## 2012-09-06 DIAGNOSIS — J189 Pneumonia, unspecified organism: Secondary | ICD-10-CM

## 2012-09-06 DIAGNOSIS — D649 Anemia, unspecified: Secondary | ICD-10-CM

## 2012-09-06 DIAGNOSIS — A419 Sepsis, unspecified organism: Secondary | ICD-10-CM

## 2012-09-06 DIAGNOSIS — N184 Chronic kidney disease, stage 4 (severe): Secondary | ICD-10-CM

## 2012-09-06 LAB — PROTIME-INR
INR: 1.13 (ref 0.00–1.49)
Prothrombin Time: 14.3 seconds (ref 11.6–15.2)

## 2012-09-06 LAB — CBC
HCT: 29.6 % — ABNORMAL LOW (ref 36.0–46.0)
Hemoglobin: 9.7 g/dL — ABNORMAL LOW (ref 12.0–15.0)
MCH: 27.2 pg (ref 26.0–34.0)
MCHC: 32.8 g/dL (ref 30.0–36.0)
MCV: 82.9 fL (ref 78.0–100.0)
Platelets: 176 10*3/uL (ref 150–400)
RBC: 3.57 MIL/uL — ABNORMAL LOW (ref 3.87–5.11)
RDW: 14.1 % (ref 11.5–15.5)
WBC: 6.2 10*3/uL (ref 4.0–10.5)

## 2012-09-06 LAB — BASIC METABOLIC PANEL
BUN: 22 mg/dL (ref 6–23)
CO2: 17 mEq/L — ABNORMAL LOW (ref 19–32)
Calcium: 8.6 mg/dL (ref 8.4–10.5)
Chloride: 102 mEq/L (ref 96–112)
Creatinine, Ser: 1.5 mg/dL — ABNORMAL HIGH (ref 0.50–1.10)
GFR calc Af Amer: 45 mL/min — ABNORMAL LOW (ref >90)
GFR calc non Af Amer: 39 mL/min — ABNORMAL LOW (ref >90)
Glucose, Bld: 208 mg/dL — ABNORMAL HIGH (ref 70–99)
Potassium: 4.8 mEq/L (ref 3.5–5.1)
Sodium: 132 mEq/L — ABNORMAL LOW (ref 135–145)

## 2012-09-06 LAB — CRYPTOCOCCAL ANTIGEN: Crypto Ag: NEGATIVE

## 2012-09-06 LAB — APTT: aPTT: 42 seconds — ABNORMAL HIGH (ref 24–37)

## 2012-09-06 LAB — GLUCOSE, CAPILLARY
Glucose-Capillary: 234 mg/dL — ABNORMAL HIGH (ref 70–99)
Glucose-Capillary: 178 mg/dL — ABNORMAL HIGH (ref 70–99)
Glucose-Capillary: 128 mg/dL — ABNORMAL HIGH (ref 70–99)
Glucose-Capillary: 117 mg/dL — ABNORMAL HIGH (ref 70–99)

## 2012-09-06 LAB — LACTATE DEHYDROGENASE: LDH: 370 U/L — ABNORMAL HIGH (ref 94–250)

## 2012-09-06 MED ORDER — AZITHROMYCIN 500 MG PO TABS
500.0000 mg | ORAL_TABLET | Freq: Every day | ORAL | Status: DC
  Administered 2012-09-06 – 2012-09-07 (×2): 500 mg via ORAL
  Filled 2012-09-06 (×2): qty 1

## 2012-09-06 MED ORDER — MYCOPHENOLATE SODIUM 360 MG PO TBEC: 720.0000 mg | DELAYED_RELEASE_TABLET | Freq: Two times a day (BID) | ORAL | Status: DC

## 2012-09-06 NOTE — Consult Note (Signed)
Patient: Wanda [name] DOB: 1960/10/13 Date of Admission: 09/04/2012            [facility]   Date of Consult: 09/06/2012 MD requesting consult: Marye Round Reason for consult:  ?atypical PNA  HPI -  51yo immunocompromised female with hx renal transplant 8/13 at [facility] admitted by [facility] 12/3 with fever and cough.  Fever improved initially on vanc, zosyn but 12/5 spiked again.  Pt awaiting tx to [facility] but no beds available at this time.  ID following as well.   Blood cultures negative thus far, urine culture with GNR and CXR shows atypical patchy asd. PCCM consulted for input regarding atypical CXR.   Allergies:  Allergies  Allergen Reactions  . Hydrocodone-Acetaminophen Other (See Comments)    REACTION: headaches  . Propoxyphene-Acetaminophen Other (See Comments)    REACTION: headache     PMH: Past Medical History  Diagnosis Date  . Hyperlipidemia   . Unspecified essential hypertension   . Shingles   . Obesity   . Hypothyroidism   . Diabetes mellitus type II   . Nonspecific abnormal unspecified cardiovascular function study   . Foot pain   . Renal transplant disorder   . Migraines   . Peripheral vascular disease   . PAD (peripheral artery disease)   . Diabetic peripheral neuropathy   . Dyspnea on exertion   . Anemia   . Chronic kidney disease, stage IV (severe)     "never went on dialysis" (09/04/2012)  . Arthritis     "hands" (09/04/2012)  . Gout     Home meds: Medications Prior to Admission  Medication Sig Dispense Refill  . allopurinol (ZYLOPRIM) 100 MG tablet Take 100 mg by mouth daily.      [name] Kitchen amLODipine (NORVASC) 10 MG tablet Take 10 mg by mouth 2 (two) times daily.       [name] Kitchen aspirin EC 81 MG tablet Take 81 mg by mouth daily.      . carvedilol (COREG) 25 MG tablet Take 25 mg by mouth 2 (two) times daily.        . ergocalciferol (VITAMIN D2) 50000 UNITS capsule Take 50,000 Units by mouth every 30 (thirty) days.       [name] Kitchen FLUoxetine (PROZAC) 20 MG capsule Take 20 mg by  mouth daily.       . furosemide (LASIX) 80 MG tablet Take 80 mg by mouth 2 (two) times daily.       [name] Kitchen gabapentin (NEURONTIN) 100 MG capsule Take 100 mg by mouth 2 (two) times daily.       . insulin NPH (HUMULIN N,NOVOLIN N) 100 UNIT/ML injection Inject 28 Units into the skin at bedtime.       . insulin regular (NOVOLIN R,HUMULIN R) 100 units/mL injection Inject 24-28 Units into the skin 3 (three) times daily before meals. 28 units in the morning, 24 units at noon, and 24 units at 5pm.      . levothyroxine (SYNTHROID) 50 MCG tablet Take 50 mcg by mouth daily.       . magnesium oxide (MAG-OX) 400 MG tablet Take 400 mg by mouth 3 (three) times daily.      . predniSONE (DELTASONE) 1 MG tablet Take 2 mg by mouth daily.      [name] Kitchen spironolactone (ALDACTONE) 50 MG tablet Take 50 mg by mouth daily.        [name] Kitchen sulfamethoxazole-trimethoprim (BACTRIM DS) 800-160 MG per tablet Take 1 tablet by mouth every Monday, Wednesday, and Friday.      [name] Kitchen  tacrolimus (PROGRAF) 1 MG capsule Take 5 mg by mouth 2 (two) times daily.       . traMADol (ULTRAM) 50 MG tablet Take 50 mg by mouth at bedtime. For pain      . valGANciclovir (VALCYTE) 450 MG tablet Take 900 mg by mouth daily.         Social Hx: History   Social History  . Marital Status: Married    Spouse Name: N/A    Number of Children: 2  . Years of Education: N/A   Occupational History  . School Recruitment consultant    Social History Main Topics  . Smoking status: Never Smoker   . Smokeless tobacco: Never Used  . Alcohol Use: No  . Drug Use: No  . Sexually Active: Yes   Other Topics Concern  . Not on file   Social History Narrative  . No narrative on file     Family Hx: Family History  Problem Relation Age of Onset  . Colon cancer Father     deceased at age 36     ROS: Review of Systems - c/o fevers, chills.  No further cough, cannot produce sputum.  Denies chest pain, hemoptysis, night sweats, recent travel.  All other systems reviewed and were  neg.   Filed Vitals:   09/05/12 2056 09/06/12 0536 09/06/12 0853 09/06/12 1054  BP: 178/65 181/67 178/64   Pulse: 96 101 102 105  Temp: 100.5 F (38.1 C) 102.4 F (39.1 C) 97.4 F (36.3 C) 102.7 F (39.3 C)  TempSrc: Oral Oral Oral   Resp: 18 18 18    Height: 5\' 10"  (1.778 m)     Weight: 279 lb 5.2 oz (126.7 kg)     SpO2: 99% 99% 99%     chest X-ray Dg Chest 2 View  09/04/2012  *RADIOLOGY REPORT*  Clinical Data: Shortness of breath, fever.  CHEST - 2 VIEW  Comparison: 02/07/2012  Findings:   Bilateral airspace opacities are noted, right greater than left.  The opacities are somewhat nodular, particularly in the left mid and upper lung.  Given the patient is febrile, I favor this is multifocal pneumonia.  The nodular appearance of this suggests possibility of other entities such as septic emboli. Neoplasm is less likely but not completely excluded.  Heart is normal size.  No effusions.  No acute bony abnormality.  IMPRESSION:  Patchy bilateral airspace disease, right greater than left.  This has a somewhat nodular and atypical appearance.  I suspect findings are related to multifocal pneumonia, but other entities such as septic emboli and neoplasm cannot be completely excluded. Recommend follow up after treatment to assure resolution.   Original Report Authenticated By: Rolm Baptise, M.D.     Lab 09/06/12 0830 09/05/12 0657 09/04/12 2035  HGB 9.7* 9.3* 9.9*  HCT 29.6* 28.5* 31.2*  WBC 6.2 5.4 6.2  PLT 176 166 175      BMET    Component Value Date/Time   NA 132* 09/06/2012 0830   K 4.8 09/06/2012 0830   CL 102 09/06/2012 0830   CO2 17* 09/06/2012 0830   GLUCOSE 208* 09/06/2012 0830   BUN 22 09/06/2012 0830   CREATININE 1.50* 09/06/2012 0830   CALCIUM 8.6 09/06/2012 0830   GFRNONAA 39* 09/06/2012 0830   GFRAA 45* 09/06/2012 0830      EXAM: General: obese, pleasant female, NAD sitting on side of bed  Neuro: awake, alert, appropriate, MAE  CV: s1s2 rrr PULM: resps even non labored  on RA GI: abd soft, +bs Extremities:  Warm and dry, 1+ dependent BLE edema    IMPRESSION/ PLAN:  Fever immune suppressed host Atypical CXR - central nodular opacities R>L  R/o fungal UTI  Renal transplant   PLAN -  CT chest no contrast now Cont abx per ID - broad spectrum coverage in immunocompromised pt.  Has been empirically rx for cmv and pcp.  If declines clinically would add voriconazole empirically  Will tentatively plan FOB for am 10 Cont await [facility] tx  cxr in am  coags pre Darlina Sicilian, NP 09/06/2012  2:29 PM Pager: [phone]  *Care during the described time interval was provided by me and/or other providers on the critical care team. I have reviewed this patient's available data, including medical history, events of note, physical examination and test results as part of my evaluation.  I have fully examined this patient and agree with above findings.    And edited in [place]. Titus Mould, MD, [place] Pgr: [facility]

## 2012-09-06 NOTE — Consult Note (Signed)
[place] for Infectious Disease    Date of Admission:  09/04/2012  Date of Consult:  09/06/2012  Reason for Consult: Fever, ? Nodular pneumonia in renal transplant Referring Physician: Dr. Clementeen Graham   HPI: Wanda [name] is an 51 y.o. female sp renal transplant in 05/2012 on prophylactic bactrim thrice weekly, prophylactic valcyte who has suffered from recent symptoms of dysuria, hematuria with last antibiotics with ciprofloxacin in  07/2012 w cipro given for E coli in urine, presented to the ED with acute onset of fevers, chills, myalgias, malaise. Pt denied other symptoms to me. She was concerned that she might have the "flu" as her grand-daughter was apparently dx with this at [facility] ED. She called her transplant MD surgeon at [facility] who advised urgent visit to [facility]. Pt seen in the ED and  Screening swab was negative for influenza by PCR. Blood and urine cultures taken (and latter with >100K GNR) though UA on dipstick was neg for leuk or nitrites. Pt had CXR which showed bilateral nodular opacities. Pt was started on broad spectrum vancomycin, zosyn and now azithromycin. Despite broad spectrum antibiotics she remains febrile, though she feels better otherwise. She is nontoxic on exam but febrile today. On close review of her travel hx xshe has only travelled within [place] and [place] on the [place]. She has one potted plant at her house near the doorway.S he has not engaged in gardening,  Or manipulation of soil. She and her husband have dogs but they live outdoors. Other than her grand-duaghter she has not other sick contacts. She denies having had any complications related to her transplantation and she never acatualy began HD despite having ahd fistula created. I spent greater than 60 minutes with the patient including greater than 50% of time in face to face counsel of the patient and in coordination of their care.     Past Medical History  Diagnosis Date  . Hyperlipidemia     . Unspecified essential hypertension   . Shingles   . Obesity   . Hypothyroidism   . Diabetes mellitus type II   . Nonspecific abnormal unspecified cardiovascular function study   . Foot pain   . Renal transplant disorder   . Migraines   . Peripheral vascular disease   . PAD (peripheral artery disease)   . Diabetic peripheral neuropathy   . Dyspnea on exertion   . Anemia   . Chronic kidney disease, stage IV (severe)     "never went on dialysis" (09/04/2012)  . Arthritis     "hands" (09/04/2012)  . Gout     Past Surgical History  Procedure Date  . Av fistula placement, radiocephalic [id number]    "right upper arm" (09/04/2012)  . Cesarean section 1986; 1987  . Endometrial cryoablation 1980's  . Colonoscopy   . Av fistula placement   . Transplantation renal 2013    "right" (09/04/2012)  . Cholecystectomy 2011  . Varicose vein surgery 1997    "RLE; stripped" (09/04/2012)  ergies:   Allergies  Allergen Reactions  . Hydrocodone-Acetaminophen Other (See Comments)    REACTION: headaches  . Propoxyphene-Acetaminophen Other (See Comments)    REACTION: headache     Medications: I have reviewed patients current medications as documented in Epic Anti-infectives     Start     Dose/Rate Route Frequency Ordered Stop   09/06/12 1400   azithromycin (ZITHROMAX) tablet 500 mg        500 mg Oral Daily 09/06/12 1231  09/05/12 1900   vancomycin (VANCOCIN) 1,500 mg in sodium chloride 0.9 % 500 mL IVPB        1,500 mg 250 mL/hr over 120 Minutes Intravenous Every 24 hours 09/04/12 1810     09/05/12 1000  sulfamethoxazole-trimethoprim (BACTRIM DS) 800-160 MG per tablet 1 tablet       1 tablet Oral Every M-W-F 09/04/12 1841     09/05/12 1000   valGANciclovir (VALCYTE) 450 MG tablet TABS 900 mg  Status:  Discontinued        900 mg Oral Daily 09/04/12 1841 09/04/12 2014   09/05/12 1000   piperacillin-tazobactam (ZOSYN) IVPB 3.375 g        3.375 g 12.5 mL/hr over 240 Minutes Intravenous  Every 8 hours 09/05/12 0306     09/05/12 0100   piperacillin-tazobactam (ZOSYN) IVPB 3.375 g  Status:  Discontinued        3.375 g 100 mL/hr over 30 Minutes Intravenous Every 8 hours 09/04/12 1810 09/05/12 0306   09/04/12 2030   valGANciclovir (VALCYTE) 450 MG tablet TABS 900 mg        900 mg Oral Daily 09/04/12 2014     09/04/12 1815   vancomycin (VANCOCIN) 500 mg in sodium chloride 0.9 % 100 mL IVPB        500 mg 100 mL/hr over 60 Minutes Intravenous NOW 09/04/12 1810 09/04/12 1945   09/04/12 1615   vancomycin (VANCOCIN) 1,500 mg in sodium chloride 0.9 % 500 mL IVPB        1,500 mg 250 mL/hr over 120 Minutes Intravenous  Once 09/04/12 1614 09/04/12 1935   09/04/12 1615   piperacillin-tazobactam (ZOSYN) IVPB 3.375 g        3.375 g 12.5 mL/hr over 240 Minutes Intravenous  Once 09/04/12 1614 09/04/12 1737          Social History:  reports that she has never smoked. She has never used smokeless tobacco. She reports that she does not drink alcohol or use illicit drugs.  Family History  Problem Relation Age of Onset  . Colon cancer Father     deceased at age 63    As in HPI and primary teams notes otherwise 12 point review of systems is negative  Blood pressure 189/72, pulse 98, temperature 99.7 F (37.6 C), temperature source Oral, resp. rate 18, height 5\' 10"  (1.778 m), weight 279 lb 5.2 oz (126.7 kg), SpO2 98.00%. General: Alert and awake, oriented x3, not in any acute distress. HEENT: anicteric sclera, pupils reactive to light and accommodation, EOMI, oropharynx clear and without exudate CVS regular rate, normal r,  no murmur rubs or gallops Chest: clear to auscultation bilaterally, no wheezing, rales or rhonchi Abdomen: soft nontender, nondistended, normal bowel sounds, Extremities: no  clubbing or edema noted bilaterally Skin: no rashes, fistula healthy appearing Neuro: nonfocal, strength and sensation intact   Results for orders placed during the hospital encounter  of 09/04/12 (from the past 48 hour(s))  CULTURE, BLOOD (ROUTINE X 2)     Status: Normal (Preliminary result)   Collection Time   09/04/12  4:10 PM      Component Value Range Comment   Specimen Description BLOOD LEFT ARM      Special Requests BOTTLES DRAWN AEROBIC AND ANAEROBIC 10CC      Culture  Setup Time 09/04/2012 23:06      Culture        Value:        BLOOD CULTURE RECEIVED NO GROWTH TO DATE  CULTURE WILL BE HELD FOR 5 DAYS BEFORE ISSUING A FINAL NEGATIVE REPORT   Report Status PENDING     CULTURE, BLOOD (ROUTINE X 2)     Status: Normal (Preliminary result)   Collection Time   09/04/12  4:20 PM      Component Value Range Comment   Specimen Description BLOOD LEFT HAND      Special Requests BOTTLES DRAWN AEROBIC AND ANAEROBIC 10CC      Culture  Setup Time 09/04/2012 23:06      Culture        Value:        BLOOD CULTURE RECEIVED NO GROWTH TO DATE CULTURE WILL BE HELD FOR 5 DAYS BEFORE ISSUING A FINAL NEGATIVE REPORT   Report Status PENDING     LACTIC ACID, PLASMA     Status: Normal   Collection Time   09/04/12  4:58 PM      Component Value Range Comment   Lactic Acid, Venous 0.9  0.5 - 2.2 mmol/L   GLUCOSE, CAPILLARY     Status: Abnormal   Collection Time   09/04/12  6:40 PM      Component Value Range Comment   Glucose-Capillary 368 (*) 70 - 99 mg/dL   INFLUENZA PANEL BY PCR     Status: Normal   Collection Time   09/04/12  6:42 PM      Component Value Range Comment   Influenza A By PCR NEGATIVE  NEGATIVE    Influenza B By PCR NEGATIVE  NEGATIVE    H1N1 flu by pcr NOT DETECTED  NOT DETECTED   CBC     Status: Abnormal   Collection Time   09/04/12  8:35 PM      Component Value Range Comment   WBC 6.2  4.0 - 10.5 K/uL    RBC 3.68 (*) 3.87 - 5.11 MIL/uL    Hemoglobin 9.9 (*) 12.0 - 15.0 g/dL    HCT 31.2 (*) 36.0 - 46.0 %    MCV 84.8  78.0 - 100.0 fL    MCH 26.9  26.0 - 34.0 pg    MCHC 31.7  30.0 - 36.0 g/dL    RDW 14.2  11.5 - 15.5 %    Platelets 175  150 - 400 K/uL     CREATININE, SERUM     Status: Abnormal   Collection Time   09/04/12  8:35 PM      Component Value Range Comment   Creatinine, Ser 1.77 (*) 0.50 - 1.10 mg/dL    GFR calc non Af Amer 32 (*) >90 mL/min    GFR calc Af Amer 37 (*) >90 mL/min   HEMOGLOBIN A1C     Status: Abnormal   Collection Time   09/04/12  8:35 PM      Component Value Range Comment   Hemoglobin A1C 12.8 (*) <5.7 %    Mean Plasma Glucose 321 (*) <117 mg/dL   GLUCOSE, CAPILLARY     Status: Abnormal   Collection Time   09/04/12  8:40 PM      Component Value Range Comment   Glucose-Capillary 473 (*) 70 - 99 mg/dL   MRSA PCR SCREENING     Status: Normal   Collection Time   09/04/12 11:56 PM      Component Value Range Comment   MRSA by PCR NEGATIVE  NEGATIVE   GLUCOSE, CAPILLARY     Status: Abnormal   Collection Time   09/05/12  2:47 AM  Component Value Range Comment   Glucose-Capillary 366 (*) 70 - 99 mg/dL   CBC     Status: Abnormal   Collection Time   09/05/12  6:57 AM      Component Value Range Comment   WBC 5.4  4.0 - 10.5 K/uL    RBC 3.39 (*) 3.87 - 5.11 MIL/uL    Hemoglobin 9.3 (*) 12.0 - 15.0 g/dL    HCT 28.5 (*) 36.0 - 46.0 %    MCV 84.1  78.0 - 100.0 fL    MCH 27.4  26.0 - 34.0 pg    MCHC 32.6  30.0 - 36.0 g/dL    RDW 14.2  11.5 - 15.5 %    Platelets 166  150 - 400 K/uL   BASIC METABOLIC PANEL     Status: Abnormal   Collection Time   09/05/12  6:57 AM      Component Value Range Comment   Sodium 132 (*) 135 - 145 mEq/L    Potassium 4.7  3.5 - 5.1 mEq/L    Chloride 103  96 - 112 mEq/L    CO2 21  19 - 32 mEq/L    Glucose, Bld 399 (*) 70 - 99 mg/dL    BUN 29 (*) 6 - 23 mg/dL    Creatinine, Ser 1.55 (*) 0.50 - 1.10 mg/dL    Calcium 8.5  8.4 - 10.5 mg/dL    GFR calc non Af Amer 37 (*) >90 mL/min    GFR calc Af Amer 43 (*) >90 mL/min   GLUCOSE, CAPILLARY     Status: Abnormal   Collection Time   09/05/12  7:35 AM      Component Value Range Comment   Glucose-Capillary 386 (*) 70 - 99 mg/dL   GLUCOSE,  CAPILLARY     Status: Abnormal   Collection Time   09/05/12  2:38 PM      Component Value Range Comment   Glucose-Capillary 234 (*) 70 - 99 mg/dL   GLUCOSE, CAPILLARY     Status: Abnormal   Collection Time   09/05/12  4:58 PM      Component Value Range Comment   Glucose-Capillary 194 (*) 70 - 99 mg/dL   GLUCOSE, CAPILLARY     Status: Normal   Collection Time   09/05/12 10:06 PM      Component Value Range Comment   Glucose-Capillary 81  70 - 99 mg/dL   BASIC METABOLIC PANEL     Status: Abnormal   Collection Time   09/06/12  8:30 AM      Component Value Range Comment   Sodium 132 (*) 135 - 145 mEq/L    Potassium 4.8  3.5 - 5.1 mEq/L    Chloride 102  96 - 112 mEq/L    CO2 17 (*) 19 - 32 mEq/L    Glucose, Bld 208 (*) 70 - 99 mg/dL    BUN 22  6 - 23 mg/dL    Creatinine, Ser 1.50 (*) 0.50 - 1.10 mg/dL    Calcium 8.6  8.4 - 10.5 mg/dL    GFR calc non Af Amer 39 (*) >90 mL/min    GFR calc Af Amer 45 (*) >90 mL/min   CBC     Status: Abnormal   Collection Time   09/06/12  8:30 AM      Component Value Range Comment   WBC 6.2  4.0 - 10.5 K/uL    RBC 3.57 (*) 3.87 - 5.11 MIL/uL  Hemoglobin 9.7 (*) 12.0 - 15.0 g/dL    HCT 29.6 (*) 36.0 - 46.0 %    MCV 82.9  78.0 - 100.0 fL    MCH 27.2  26.0 - 34.0 pg    MCHC 32.8  30.0 - 36.0 g/dL    RDW 14.1  11.5 - 15.5 %    Platelets 176  150 - 400 K/uL   GLUCOSE, CAPILLARY     Status: Abnormal   Collection Time   09/06/12  9:22 AM      Component Value Range Comment   Glucose-Capillary 234 (*) 70 - 99 mg/dL   GLUCOSE, CAPILLARY     Status: Abnormal   Collection Time   09/06/12 12:07 PM      Component Value Range Comment   Glucose-Capillary 178 (*) 70 - 99 mg/dL    Comment 1 Notify RN      Comment 2 Documented in Chart         Component Value Date/Time   SDES BLOOD LEFT HAND 09/04/2012 1620   SPECREQUEST BOTTLES DRAWN AEROBIC AND ANAEROBIC 10CC 09/04/2012 1620   CULT        BLOOD CULTURE RECEIVED NO GROWTH TO DATE CULTURE WILL BE HELD FOR 5  DAYS BEFORE ISSUING A FINAL NEGATIVE REPORT 09/04/2012 1620   REPTSTATUS PENDING 09/04/2012 1620   No results found.   Recent Results (from the past 720 hour(s))  URINE CULTURE     Status: Normal (Preliminary result)   Collection Time   09/04/12  2:43 PM      Component Value Range Status Comment   Specimen Description URINE, CLEAN CATCH   Final    Special Requests Immunocompromised   Final    Culture  Setup Time 09/05/2012 14:00   Final    Colony Count >=100,000 COLONIES/ML   Final    Culture GRAM NEGATIVE RODS   Final    Report Status PENDING   Incomplete   CULTURE, BLOOD (ROUTINE X 2)     Status: Normal (Preliminary result)   Collection Time   09/04/12  4:10 PM      Component Value Range Status Comment   Specimen Description BLOOD LEFT ARM   Final    Special Requests BOTTLES DRAWN AEROBIC AND ANAEROBIC 10CC   Final    Culture  Setup Time 09/04/2012 23:06   Final    Culture     Final    Value:        BLOOD CULTURE RECEIVED NO GROWTH TO DATE CULTURE WILL BE HELD FOR 5 DAYS BEFORE ISSUING A FINAL NEGATIVE REPORT   Report Status PENDING   Incomplete   CULTURE, BLOOD (ROUTINE X 2)     Status: Normal (Preliminary result)   Collection Time   09/04/12  4:20 PM      Component Value Range Status Comment   Specimen Description BLOOD LEFT HAND   Final    Special Requests BOTTLES DRAWN AEROBIC AND ANAEROBIC 10CC   Final    Culture  Setup Time 09/04/2012 23:06   Final    Culture     Final    Value:        BLOOD CULTURE RECEIVED NO GROWTH TO DATE CULTURE WILL BE HELD FOR 5 DAYS BEFORE ISSUING A FINAL NEGATIVE REPORT   Report Status PENDING   Incomplete   MRSA PCR SCREENING     Status: Normal   Collection Time   09/04/12 11:56 PM      Component Value Range Status  Comment   MRSA by PCR NEGATIVE  NEGATIVE Final      Impression/Recommendation  51 year old pt with renal transplant with admission for fevers, malaise found to have multiple nodular opacities on CXR now 24 hours in to vancomycin ,  zosyn and recently added azithromycin she is still persistently febrile  #1 Nodular pneumonia in Renal transplant: Findings are NEW compared to CXR in 2/12.   Greatly appreciate CCM Pulmonary help here and I agree with CT chest today.  I also agree with bronchoscopy in the am if pt has not transferred to [facility] yet  With Bronchoscopy for :  Fungal cultures PCP smear  AFB cultures Cultures looking for NOcardia (Buffered Charcoal Yeast agar) Viral cultures, Respiratory Viral PCR panel CMV PCR  I will send urine histoplasma ag and serum cryptococcal antigen today, legionella ag has been ordered  I think patient is adequetely covered for "usual" bacterial suspects for HCAP with vancomycin, zosyn and azithromycin. I am NOT interested in adding an aminoglycoside for 2nd gram negative agent and dont feel great need for fluoroquinolone for 2nd gram negative agent either  Other possibilties could include right sided endocarditis with septic emboli to the lungs, we will also follow blood cultures obviously and can consider TTE or TEE esp if CT chest is suggestive of IE  #2 GNR in urine. Urine without WBC and pt withotu symptoms. WIll follow cultures  #3 Fevers: seem most likely due to #1, I will also check a CMV PCR in blood though pt is on valcyte  Thank you so much for this interesting consult  [place] [facility] [place] [phone] (pager) [phone] (office) 09/06/2012, 3:49 PM  Rhina [place] 09/06/2012, 3:49 PM

## 2012-09-06 NOTE — Progress Notes (Signed)
Patient seen by ID and pulmonary and appreciate recommendations. CT chest comments on multiple b/l airspace nodules suspicious for septic emboli. Possible for wegener's granulomatosis and cannot r/o fungal infection.  Spoke with Dr Colonel Bald , transplant surgeon at [facility] who has accepted the patient but is awaiting a bed there.  Discussed with ID , do not recommend to start antifungal at this time until cultures results available. Plan for bronch with bx if patient still here in am . NPO after midnight.

## 2012-09-06 NOTE — Discharge Summary (Signed)
Physician Discharge Summary  Wanda [name] [id number] DOB: 1961-04-06 DOA: 09/04/2012  PCP: Myriam Jacobson, MD  Admit date: 09/04/2012 Discharge date: 09/06/2012  Time spent: 35 minutes  Recommendations for Outpatient Follow-up:  1. Patient is transferred to [facility]   Discharge Diagnoses:  Atypical Healthcare-associated pneumonia - with persistent fever on Vanc and Zosyn - influenza panel negative  UTI Hx of  SHINGLES  HYPOTHYROIDISM  DIABETES MELLITUS, TYPE II - uncontrolled   HYPERLIPIDEMIA  OBESITY  CHRONIC KIDNEY DISEASE STAGE III post transplant - with component of acute failure on admission creatinine now down to 1.5   S/p Renal transplant  Hyponatremia  HTN (hypertension)  Anemia   Discharge Condition: stable - transfer to [facility]   Diet recommendation: renal, diabetic   Filed Weights   09/04/12 1700 09/04/12 2059 09/05/12 2056  Weight: 117.9 kg (259 lb 14.8 oz) 122.9 kg (270 lb 15.1 oz) 126.7 kg (279 lb 5.2 oz)    History of present illness:  Wanda [name] is a 51 y.o. female who had a renal transplant in August at [place]. She has been feeling well up to one day ago when she developed fever, mild cough and congestion. Her granddaughter was diagnosed with the flu last week and received oral medication for this. The patient  fever went as high as 103- she took tylenol and it decreased some. She called her transplant dr at [facility] and was told to come to the ER. In the ED a CXR was obtained which showed bilateral patchy infiltrates and patient was admitted for pneumonia.   Hospital Course:  1. HCAPNA- with atypical features on CXR pneumonia - patient will need x-ray follow up to assure clearance. Patient was started on Vanc/zosyn from admission and initially temperature went down.  Influenza panel was negative.  We did note that the patient was on appropriate prophylaxis against PCP and CMV. On 09/06/12 the patient had fever again and we did contact [place] and requested patient be transferred. Patient will probably need bronch with BAL to further define the pulmonary pathology. 2. Renal transplant with admission creatinine at 1.7 - continue on home immunosuppressants. d/w attending nephrologist Dr. Mercy Moore, recheck Cr- daily . Creatinine improved to 1.5 by 09/05/12 and 09/06/12. progaf level pending.  3. Urine culture grew 100 k bacteria - patient is on Zosyn which should provide adequate coverage. Could consider switching to a different abx directed at GNRs if fever persists. 4. DM type 2 - uncontrolled A1C 12.8 We did increase the NPH dose , resumed meal time Novolog. Patient needs an endocrinology consult at [facility].  5. 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 HTN - noted elevated readings throughout hospitalization. Needs a more aggressive outpatient regimen.  7. Metabolic acidosis - new onset 09/06/12 . ? Due to renal failure but need to consider also lactic acidosis - needs repeat labs upon arrival at [facility] and lactate level. Patient is hemodynamically stable at the moment    Procedures: CXR  Consultations:  none  Discharge Exam: Filed Vitals:   09/05/12 2056 09/06/12 0536 09/06/12 0853 09/06/12 1054  BP: 178/65 181/67 178/64   Pulse: 96 101 102 105  Temp: 100.5 F (38.1 C) 102.4 F (39.1 C) 97.4 F (36.3 C) 102.7 F (39.3 C)  TempSrc: Oral Oral Oral   Resp: 18 18 18    Height: 5\' 10"  (1.778 m)     Weight: 126.7 kg (279 lb 5.2 oz)     SpO2: 99% 99% 99%  Patient Vitals for the past 24 hrs:  BP Temp Temp src Pulse Resp SpO2 Height Weight  09/06/12 1054 - 102.7 F (39.3 C) - 105  - - - -  09/06/12 0853 178/64 mmHg 97.4 F (36.3 C) Oral 102  18  99 % - -  09/06/12 0536 181/67 mmHg 102.4 F (39.1 C) Oral 101  18  99 % - -  09/05/12 2056 178/65 mmHg 100.5 F (38.1 C) Oral 96  18  99 % 5\' 10"  (1.778 m) 126.7 kg (279 lb 5.2 oz)  09/05/12 1810 141/89 mmHg 99 F (37.2 C) Oral 96  18  100 % - -  09/05/12 1338 114/74 mmHg 98.1  F (36.7 C) Oral 90  18  100 % - -    General: axox3 Cardiovascular: rrr, nl S1,S2 Respiratory: ctab, no w,r,c - patient had no sputum in house.   Discharge Instructions  Discharge Orders    Future Appointments: Provider: Department: Dept Phone: Center:   01/07/2013 12:30 PM Hayden Pedro, MD [place] [phone] None     Future Orders Please Complete By Expires   Diet Carb Modified      Increase activity slowly          Medication List     As of 09/06/2012 11:40 AM    TAKE these medications         allopurinol 100 MG tablet   Commonly known as: ZYLOPRIM   Take 100 mg by mouth daily.      amLODipine 10 MG tablet   Commonly known as: NORVASC   Take 10 mg by mouth 2 (two) times daily.      aspirin EC 81 MG tablet   Take 81 mg by mouth daily.      carvedilol 25 MG tablet   Commonly known as: COREG   Take 25 mg by mouth 2 (two) times daily.      ergocalciferol 50000 UNITS capsule   Commonly known as: VITAMIN D2   Take 50,000 Units by mouth every 30 (thirty) days.      FLUoxetine 20 MG capsule   Commonly known as: PROZAC   Take 20 mg by mouth daily.      furosemide 80 MG tablet   Commonly known as: LASIX   Take 80 mg by mouth 2 (two) times daily.      gabapentin 100 MG capsule   Commonly known as: NEURONTIN   Take 100 mg by mouth 2 (two) times daily.      insulin NPH 100 UNIT/ML injection   Commonly known as: HUMULIN N,NOVOLIN N   Inject 28 Units into the skin at bedtime.      insulin regular 100 units/mL injection   Commonly known as: NOVOLIN R,HUMULIN R   Inject 24-28 Units into the skin 3 (three) times daily before meals. 28 units in the morning, 24 units at noon, and 24 units at 5pm.      magnesium oxide 400 MG tablet   Commonly known as: MAG-OX   Take 400 mg by mouth 3 (three) times daily.      mycophenolate 360 MG Tbec   Commonly known as: MYFORTIC   Take 2 tablets (720 mg total) by mouth 2 (two) times daily.       predniSONE 1 MG tablet   Commonly known as: DELTASONE   Take 2 mg by mouth daily.      spironolactone 50 MG tablet   Commonly known as:  ALDACTONE   Take 50 mg by mouth daily.      sulfamethoxazole-trimethoprim 800-160 MG per tablet   Commonly known as: BACTRIM DS   Take 1 tablet by mouth every Monday, Wednesday, and Friday.      SYNTHROID 50 MCG tablet   Generic drug: levothyroxine   Take 50 mcg by mouth daily.      tacrolimus 1 MG capsule   Commonly known as: PROGRAF   Take 5 mg by mouth 2 (two) times daily.      traMADol 50 MG tablet   Commonly known as: ULTRAM   Take 50 mg by mouth at bedtime. For pain      valGANciclovir 450 MG tablet   Commonly known as: VALCYTE   Take 900 mg by mouth daily.           Follow-up Information    Follow up with ROBERTS, Sharol Given, MD.   Contact information:   [address] [place] [address] [phone]           The results of significant diagnostics from this hospitalization (including imaging, microbiology, ancillary and laboratory) are listed below for reference.    Significant Diagnostic Studies: Dg Chest 2 View  09/04/2012  *RADIOLOGY REPORT*  Clinical Data: Shortness of breath, fever.  CHEST - 2 VIEW  Comparison: 02/07/2012  Findings:   Bilateral airspace opacities are noted, right greater than left.  The opacities are somewhat nodular, particularly in the left mid and upper lung.  Given the patient is febrile, I favor this is multifocal pneumonia.  The nodular appearance of this suggests possibility of other entities such as septic emboli. Neoplasm is less likely but not completely excluded.  Heart is normal size.  No effusions.  No acute bony abnormality.  IMPRESSION:  Patchy bilateral airspace disease, right greater than left.  This has a somewhat nodular and atypical appearance.  I suspect findings are related to multifocal pneumonia, but other entities such as septic emboli and neoplasm cannot be  completely excluded. Recommend follow up after treatment to assure resolution.   Original Report Authenticated By: Rolm Baptise, M.D.    Mm Digital Diagnostic Unilat R  08/23/2012  *RADIOLOGY REPORT*  Clinical Data:  Possible mass right breast identified on recent screening mammogram.  DIGITAL DIAGNOSTIC RIGHT MAMMOGRAM WITHOUT CAD  Comparison:  08/06/2012 and 10/22/2009  Findings:  Focal spot compression views of the posterior third of the upper outer right breast show dispersion of fibroglandular breast tissue.  No persistent mass or distortion is identified. Findings on the recent screening mammogram are most consistent with overlap of fibroglandular breast tissue.  IMPRESSION: No evidence of malignancy in the right breast.  RECOMMENDATION: Bilateral screening mammogram in 1 year.  I have discussed the findings and recommendations with the patient. Results were also provided in writing at the conclusion of the visit.  BI-RADS CATEGORY 1:  Negative.   Original Report Authenticated By: Curlene Dolphin, M.D.     Microbiology: Recent Results (from the past 240 hour(s))  URINE CULTURE     Status: Normal (Preliminary result)   Collection Time   09/04/12  2:43 PM      Component Value Range Status Comment   Specimen Description URINE, CLEAN CATCH   Final    Special Requests Immunocompromised   Final    Culture  Setup Time 09/05/2012 14:00   Final    Colony Count >=100,000 COLONIES/ML   Final    Culture GRAM NEGATIVE RODS  Final    Report Status PENDING   Incomplete   CULTURE, BLOOD (ROUTINE X 2)     Status: Normal (Preliminary result)   Collection Time   09/04/12  4:10 PM      Component Value Range Status Comment   Specimen Description BLOOD LEFT ARM   Final    Special Requests BOTTLES DRAWN AEROBIC AND ANAEROBIC 10CC   Final    Culture  Setup Time 09/04/2012 23:06   Final    Culture     Final    Value:        BLOOD CULTURE RECEIVED NO GROWTH TO DATE CULTURE WILL BE HELD FOR 5 DAYS BEFORE ISSUING A  FINAL NEGATIVE REPORT   Report Status PENDING   Incomplete   CULTURE, BLOOD (ROUTINE X 2)     Status: Normal (Preliminary result)   Collection Time   09/04/12  4:20 PM      Component Value Range Status Comment   Specimen Description BLOOD LEFT HAND   Final    Special Requests BOTTLES DRAWN AEROBIC AND ANAEROBIC 10CC   Final    Culture  Setup Time 09/04/2012 23:06   Final    Culture     Final    Value:        BLOOD CULTURE RECEIVED NO GROWTH TO DATE CULTURE WILL BE HELD FOR 5 DAYS BEFORE ISSUING A FINAL NEGATIVE REPORT   Report Status PENDING   Incomplete   MRSA PCR SCREENING     Status: Normal   Collection Time   09/04/12 11:56 PM      Component Value Range Status Comment   MRSA by PCR NEGATIVE  NEGATIVE Final      Labs: Basic Metabolic Panel:  Lab [id number] 09/05/12 0657 09/04/12 2035 09/04/12 1520  NA 132* 132* -- 128*  K 4.8 4.7 -- 5.1  CL 102 103 -- 97  CO2 17* 21 -- 21  GLUCOSE 208* 399* -- 321*  BUN 22 29* -- 28*  CREATININE 1.50* 1.55* 1.77* 1.75*  CALCIUM 8.6 8.5 -- 8.7  MG -- -- -- --  PHOS -- -- -- --   Liver Function Tests: No results found for this basename: AST:5,ALT:5,ALKPHOS:5,BILITOT:5,PROT:5,ALBUMIN:5 in the last 168 hours No results found for this basename: LIPASE:5,AMYLASE:5 in the last 168 hours No results found for this basename: AMMONIA:5 in the last 168 hours CBC:  Lab 09/06/12 0830 09/05/12 0657 09/04/12 2035 09/04/12 1520  WBC 6.2 5.4 6.2 7.9  NEUTROABS -- -- -- 7.0  HGB 9.7* 9.3* 9.9* 10.3*  HCT 29.6* 28.5* 31.2* 31.9*  MCV 82.9 84.1 84.8 84.6  PLT 176 166 175 178   Cardiac Enzymes: No results found for this basename: CKTOTAL:5,CKMB:5,CKMBINDEX:5,TROPONINI:5 in the last 168 hours BNP: BNP (last 3 results) No results found for this basename: PROBNP:3 in the last 8760 hours CBG:  Lab 09/06/12 0922 09/05/12 2206 09/05/12 1658 09/05/12 1438 09/05/12 0735  GLUCAP 234* 81 194* 234* 386*       Signed:  Jaedin Trumbo  [facility] 09/06/2012, 11:40 AM

## 2012-09-06 NOTE — Progress Notes (Addendum)
Informed by Dr. Marye Round that patient is to be transferred to [facility]. [place] at [phone] and spoke with Sheppard Evens. Sheppard Evens stated that [facility] does not currently have a bed available for this patient, and will most likely not have a bed today. Stated they will call us daily and make [place] aware of the bed status and arrange transport. Allexus Ovens, Bryn Gulling  Dr. Marye Round made aware. Isabella Ida, Bryn Gulling

## 2012-09-07 ENCOUNTER — Inpatient Hospital Stay (HOSPITAL_COMMUNITY): Payer: 59

## 2012-09-07 ENCOUNTER — Encounter (HOSPITAL_COMMUNITY)
Admission: EM | Disposition: A | Discharge status: Other Acute Inpatient Hospital | Payer: Self-pay | Source: Home / Self Care | Attending: Internal Medicine

## 2012-09-07 DIAGNOSIS — E119 Type 2 diabetes mellitus without complications: Secondary | ICD-10-CM

## 2012-09-07 HISTORY — PX: VIDEO BRONCHOSCOPY: SHX5072

## 2012-09-07 LAB — GLUCOSE, CAPILLARY
Glucose-Capillary: 188 mg/dL — ABNORMAL HIGH (ref 70–99)
Glucose-Capillary: 147 mg/dL — ABNORMAL HIGH (ref 70–99)
Glucose-Capillary: 177 mg/dL — ABNORMAL HIGH (ref 70–99)
Glucose-Capillary: 191 mg/dL — ABNORMAL HIGH (ref 70–99)

## 2012-09-07 LAB — BODY FLUID CELL COUNT WITH DIFFERENTIAL
Lymphs, Fluid: 25 %
Lymphs, Fluid: 22 %
Monocyte-Macrophage-Serous Fluid: 63 % (ref 50–90)
Monocyte-Macrophage-Serous Fluid: 54 % (ref 50–90)
Neutrophil Count, Fluid: 12 % (ref 0–25)
Neutrophil Count, Fluid: 24 % (ref 0–25)
Total Nucleated Cell Count, Fluid: 998 cu mm (ref 0–1000)
Total Nucleated Cell Count, Fluid: 22 cu mm (ref 0–1000)

## 2012-09-07 LAB — LEGIONELLA ANTIGEN, URINE: Legionella Antigen, Urine: NEGATIVE

## 2012-09-07 LAB — BASIC METABOLIC PANEL
BUN: 27 mg/dL — ABNORMAL HIGH (ref 6–23)
CO2: 18 mEq/L — ABNORMAL LOW (ref 19–32)
Calcium: 8.5 mg/dL (ref 8.4–10.5)
Chloride: 103 mEq/L (ref 96–112)
Creatinine, Ser: 1.81 mg/dL — ABNORMAL HIGH (ref 0.50–1.10)
GFR calc Af Amer: 36 mL/min — ABNORMAL LOW (ref >90)
GFR calc non Af Amer: 31 mL/min — ABNORMAL LOW (ref >90)
Glucose, Bld: 213 mg/dL — ABNORMAL HIGH (ref 70–99)
Potassium: 4.8 mEq/L (ref 3.5–5.1)
Sodium: 132 mEq/L — ABNORMAL LOW (ref 135–145)

## 2012-09-07 LAB — URINE CULTURE: Colony Count: >=100000

## 2012-09-07 LAB — PNEUMOCYSTIS JIROVECI SMEAR BY DFA: Pneumocystis jiroveci Ag: NEGATIVE

## 2012-09-07 LAB — CBC
HCT: 28.7 % — ABNORMAL LOW (ref 36.0–46.0)
Hemoglobin: 9.5 g/dL — ABNORMAL LOW (ref 12.0–15.0)
MCH: 27.5 pg (ref 26.0–34.0)
MCHC: 33.1 g/dL (ref 30.0–36.0)
MCV: 82.9 fL (ref 78.0–100.0)
Platelets: 153 10*3/uL (ref 150–400)
RBC: 3.46 MIL/uL — ABNORMAL LOW (ref 3.87–5.11)
RDW: 14 % (ref 11.5–15.5)
WBC: 3.9 10*3/uL — ABNORMAL LOW (ref 4.0–10.5)

## 2012-09-07 LAB — TACROLIMUS LEVEL: Tacrolimus (FK506) - LabCorp: 9 ng/mL

## 2012-09-07 SURGERY — VIDEO BRONCHOSCOPY WITHOUT FLUORO
Anesthesia: Moderate Sedation | Laterality: Bilateral

## 2012-09-07 MED ORDER — FENTANYL CITRATE 0.05 MG/ML IJ SOLN
INTRAMUSCULAR | Status: DC | PRN
  Administered 2012-09-07 (×2): 50 ug via INTRAVENOUS

## 2012-09-07 MED ORDER — MIDAZOLAM HCL 5 MG/ML IJ SOLN
INTRAMUSCULAR | Status: AC
  Filled 2012-09-07: qty 2

## 2012-09-07 MED ORDER — MYCOPHENOLATE SODIUM 180 MG PO TBEC
360.0000 mg | DELAYED_RELEASE_TABLET | Freq: Two times a day (BID) | ORAL | Status: DC
  Administered 2012-09-07: 360 mg via ORAL
  Filled 2012-09-07: qty 2

## 2012-09-07 MED ORDER — PHENYLEPHRINE HCL 0.25 % NA SOLN
1.0000 | Freq: Four times a day (QID) | NASAL | Status: DC | PRN
  Filled 2012-09-07: qty 15

## 2012-09-07 MED ORDER — LIDOCAINE HCL 2 % EX GEL
Freq: Once | CUTANEOUS | Status: DC
  Filled 2012-09-07: qty 5

## 2012-09-07 MED ORDER — SODIUM CHLORIDE 0.9 % IV SOLN: 250.0000 mL | Freq: Once | INTRAVENOUS | Status: DC

## 2012-09-07 MED ORDER — BUTAMBEN-TETRACAINE-BENZOCAINE 2-2-14 % EX AERO
1.0000 | INHALATION_SPRAY | Freq: Once | CUTANEOUS | Status: DC
Start: 2012-09-07 — End: 2012-09-07
  Filled 2012-09-07: qty 56

## 2012-09-07 MED ORDER — DEXTROMETHORPHAN POLISTIREX 30 MG/5ML PO LQCR
5.0000 mL | Freq: Two times a day (BID) | ORAL | Status: DC | PRN
  Filled 2012-09-07: qty 5

## 2012-09-07 MED ORDER — VORICONAZOLE 200 MG PO TABS
400.0000 mg | ORAL_TABLET | Freq: Two times a day (BID) | ORAL | Status: DC
  Administered 2012-09-07: 400 mg via ORAL
  Filled 2012-09-07 (×2): qty 2

## 2012-09-07 MED ORDER — MIDAZOLAM HCL 10 MG/2ML IJ SOLN
INTRAMUSCULAR | Status: DC | PRN
  Administered 2012-09-07 (×3): 2 mg via INTRAVENOUS

## 2012-09-07 MED ORDER — HYDRALAZINE HCL 10 MG PO TABS
10.0000 mg | ORAL_TABLET | Freq: Four times a day (QID) | ORAL | Status: DC | PRN
  Filled 2012-09-07: qty 1

## 2012-09-07 MED ORDER — HYDROCOD POLST-CHLORPHEN POLST 10-8 MG/5ML PO LQCR
5.0000 mL | Freq: Two times a day (BID) | ORAL | Status: DC | PRN
  Administered 2012-09-07: 5 mL via ORAL
  Filled 2012-09-07: qty 5

## 2012-09-07 MED ORDER — INSULIN ASPART 100 UNIT/ML ~~LOC~~ SOLN
10.0000 [IU] | Freq: Once | SUBCUTANEOUS | Status: AC
  Administered 2012-09-07: 10 [IU] via SUBCUTANEOUS

## 2012-09-07 MED ORDER — BUTAMBEN-TETRACAINE-BENZOCAINE 2-2-14 % EX AERO
INHALATION_SPRAY | CUTANEOUS | Status: DC | PRN
  Administered 2012-09-07: 1 via TOPICAL

## 2012-09-07 MED ORDER — FENTANYL CITRATE 0.05 MG/ML IJ SOLN
INTRAMUSCULAR | Status: AC
  Filled 2012-09-07: qty 4

## 2012-09-07 MED ORDER — LIDOCAINE HCL (PF) 1 % IJ SOLN
INTRAMUSCULAR | Status: DC | PRN
  Administered 2012-09-07: 5 mL

## 2012-09-07 MED ORDER — VORICONAZOLE 200 MG PO TABS: 200.0000 mg | ORAL_TABLET | Freq: Two times a day (BID) | ORAL | Status: DC

## 2012-09-07 NOTE — Progress Notes (Signed)
Pt has has three large watery bowel/urine episodes.

## 2012-09-07 NOTE — Procedures (Signed)
Bronchoscopy Procedure Note Wanda [name] [id number] 02-19-1961  Pre-operative diagnosis: Atypical pneumonia. Post-operative diagnosis: Same.  Procedure: Bronchoscopy Indications: Obtain specimens for culture and/or other diagnostic studies  Procedure Details Consent: Risks of procedure as well as the alternatives and risks of each were explained to the patient.  Consent for procedure obtained. Time Out: Verified patient identification, verified procedure, site/side was marked, verified correct patient position, special equipment/implants available, medications/allergies/relevent history reviewed, required imaging and test results available.  Performed  In preparation for procedure, patient was given 100% FiO2. Sedation: 6 mg versed, 100 mcg fentanyl.  Bronchoscope entered orally.  Vocal cord visualized with normal movement.  8 ml 1% lidocaine for airway anesthesia.  Bronchoscope passed through vocal cords.  Rt upper, middle, lower lobes visualized.  No endobronchial lesions.    Lt upper, lingular, and lower lobes visualized.  No endobronchial lesions.  Bronchoscope wedged into Lt upper lobe.  60 ml saline instilled with 15 ml fluid returned.  Bronchoscope then wedged into LLL.  60 ml of saline instilled with 20 ml of fluid returned.  Evaluation Hemodynamic Status: BP stable throughout; O2 sats: stable throughout Patient's Current Condition: stable Specimens:  Sent serosanguinous fluid Complications: No apparent complications Patient did tolerate procedure well.   Chesley Mires, MD [facility] Pulmonary/Critical Care 09/07/2012, 11:26 AM Pager:  [phone] After 3pm call: [phone]

## 2012-09-07 NOTE — Progress Notes (Signed)
[facility] PROGRESS NOTE  Wanda [name] [id number] DOB: 10/28/1960 DOA: 09/04/2012 PCP: Myriam Jacobson, MD   Brief narrative: 51 y.o. female who had a renal transplant in August at [facility] presents with fever, mild cough and congestion. Her granddaughter was diagnosed with the flu last week and received oral medication for this. The patient fever went as high as 103- she took tylenol and it decreased some. She called her transplant dr at [facility] and was told to come to the ER. In the ED a CXR was obtained which showed bilateral patchy infiltrates and patient was admitted for pneumonia.   Assessment/Plan:  1. HCAP - with atypical features on CXR pneumonia - -. Patient was started on Vanc/zosyn ( DAY 3), Added azithro  for atypical coverage on 12/5. Blood cx so far no growth. urine legionella pending -. Influenza panel was negative. patient was on appropriate prophylaxis against PCP and CMV. On 09/06/12 the patient had fever again and we did contact [place] and requested patient be transferred.  ID and PCCM consulted . CT chest done comments on multiple b/l airspace nodules suspicious for septic emboli. Possible for wegener's granulomatosis and cannot r/o fungal infection. Started on steroids and antiviral. Bronch done on today and lavage sent . -pending bed availability  at [facility] for transfer. Has been accepted by Dr Colonel Bald her transplant surgeon there.    2. S/p Renal transplant -  admission creatinine at 1.7 - continue on home immunosuppressants. d/w attending nephrologist Dr. Mercy Moore, recheck Cr- daily .   3. UTI Urine culture grew 100 k bacteria - patient is on Zosyn which should cover  4. DM type 2  - uncontrolled with A1C 12.8 . increased the NPH dose.  resumed meal time Novolog. Patient needs an endocrinology consult at [facility].   5. 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 HTN - elevated BP noted. Added prn hydralazine  7. Metabolic acidosis - new onset  09/06/12 .will monitor   Code Status: full  Family Communication: none at bedside Disposition Plan: transfer to [place] once bed available   Consultants:  PCCM  ID  Procedures:  Bronchoscopy with bx on 12/6  Antibiotics:  IV vanco and zosyn: >>12/03  Azithromycin >>12/5  valgancy  HPI/Subjective: Had bronch done today. tolerated well.still having temp spikes  Objective: Filed Vitals:   09/07/12 1250 09/07/12 1300 09/07/12 1310 09/07/12 1407  BP: 159/76 159/76 161/75 155/71  Pulse:      Temp:    100 F (37.8 C)  TempSrc:      Resp: 24 22 25 18   Height:      Weight:      SpO2: 95% 94% 94% 91%    Intake/Output Summary (Last 24 hours) at 09/07/12 1422 Last data filed at 09/07/12 0913  Gross per 24 hour  Intake   1460 ml  Output    450 ml  Net   1010 ml   Filed Weights   09/04/12 2059 09/05/12 2056 09/06/12 2207  Weight: 122.9 kg (270 lb 15.1 oz) 126.7 kg (279 lb 5.2 oz) 122.8 kg (270 lb 11.6 oz)    Exam:   General: middle aged female in NAD  HEENT: no pallor, moist oral mucosa  Cardiovascular: NS1&S2, no murmurs  Respiratory: clear b/l, no added sounds  Abdomen: soft, NT, ND, BS+  Ext: warm, no edema   CNS: AAOX3  Data Reviewed: Basic Metabolic Panel:  Lab [id number] 0900 09/06/12 0830 09/05/12 0657 09/04/12 2035 09/04/12 1520  NA  132* 132* 132* -- 128*  K 4.8 4.8 4.7 -- 5.1  CL 103 102 103 -- 97  CO2 18* 17* 21 -- 21  GLUCOSE 213* 208* 399* -- 321*  BUN 27* 22 29* -- 28*  CREATININE 1.81* 1.50* 1.55* 1.77* 1.75*  CALCIUM 8.5 8.6 8.5 -- 8.7  MG -- -- -- -- --  PHOS -- -- -- -- --   Liver Function Tests: No results found for this basename: AST:5,ALT:5,ALKPHOS:5,BILITOT:5,PROT:5,ALBUMIN:5 in the last 168 hours No results found for this basename: LIPASE:5,AMYLASE:5 in the last 168 hours No results found for this basename: AMMONIA:5 in the last 168 hours CBC:  Lab 09/07/12 0900 09/06/12 0830 09/05/12 0657 09/04/12 2035 09/04/12 1520  WBC  3.9* 6.2 5.4 6.2 7.9  NEUTROABS -- -- -- -- 7.0  HGB 9.5* 9.7* 9.3* 9.9* 10.3*  HCT 28.7* 29.6* 28.5* 31.2* 31.9*  MCV 82.9 82.9 84.1 84.8 84.6  PLT 153 176 166 175 178   Cardiac Enzymes: No results found for this basename: CKTOTAL:5,CKMB:5,CKMBINDEX:5,TROPONINI:5 in the last 168 hours BNP (last 3 results) No results found for this basename: PROBNP:3 in the last 8760 hours CBG:  Lab 09/07/12 1406 09/07/12 0727 09/06/12 2211 09/06/12 1652 09/06/12 1207  GLUCAP 147* 188* 117* 128* 178*    Recent Results (from the past 240 hour(s))  URINE CULTURE     Status: Normal   Collection Time   09/04/12  2:43 PM      Component Value Range Status Comment   Specimen Description URINE, CLEAN CATCH   Final    Special Requests Immunocompromised   Final    Culture  Setup Time 09/05/2012 14:00   Final    Colony Count >=100,000 COLONIES/ML   Final    Culture KLEBSIELLA PNEUMONIAE   Final    Report Status 09/07/2012 FINAL   Final    Organism ID, Bacteria KLEBSIELLA PNEUMONIAE   Final   CULTURE, BLOOD (ROUTINE X 2)     Status: Normal (Preliminary result)   Collection Time   09/04/12  4:10 PM      Component Value Range Status Comment   Specimen Description BLOOD LEFT ARM   Final    Special Requests BOTTLES DRAWN AEROBIC AND ANAEROBIC 10CC   Final    Culture  Setup Time 09/04/2012 23:06   Final    Culture     Final    Value:        BLOOD CULTURE RECEIVED NO GROWTH TO DATE CULTURE WILL BE HELD FOR 5 DAYS BEFORE ISSUING A FINAL NEGATIVE REPORT   Report Status PENDING   Incomplete   CULTURE, BLOOD (ROUTINE X 2)     Status: Normal (Preliminary result)   Collection Time   09/04/12  4:20 PM      Component Value Range Status Comment   Specimen Description BLOOD LEFT HAND   Final    Special Requests BOTTLES DRAWN AEROBIC AND ANAEROBIC 10CC   Final    Culture  Setup Time 09/04/2012 23:06   Final    Culture     Final    Value:        BLOOD CULTURE RECEIVED NO GROWTH TO DATE CULTURE WILL BE HELD FOR 5 DAYS  BEFORE ISSUING A FINAL NEGATIVE REPORT   Report Status PENDING   Incomplete   MRSA PCR SCREENING     Status: Normal   Collection Time   09/04/12 11:56 PM      Component Value Range Status Comment   MRSA by  PCR NEGATIVE  NEGATIVE Final      Studies: Ct Chest Wo Contrast  09/06/2012  *RADIOLOGY REPORT*  Clinical Data: Evaluate typical nodular opacities.  The patient has fevers, and a history of renal transplant.  CT CHEST WITHOUT CONTRAST  Technique:  Multidetector CT imaging of the chest was performed following the standard protocol without IV contrast.  Comparison: Chest radiograph 09/04/2012  Findings: Heart size is normal.  Thoracic aorta is normal in caliber and contour.  Thyroid gland normal.  There is no lymphadenopathy in the chest.  Trace bilateral pleural effusions.  There are multiple irregularly shaped nodular airspace opacities scattered throughout all lobes of both lungs. Some of these nodular airspace opacities are peripheral and some of the.  Wedge shaped appearance.  Cannot exclude a small areas of cavitation within some of these nodular airspace opacities.  For instance in the right middle lobe on image #39 of the lung windows in the right upper lobe on image number 30 left upper lobe on image number 23.  There is a larger area of focal consolidation in the left lower lobe.  No acute bony abnormality.  Imaged portion of the upper abdomen demonstrates cholecystectomy changes.  No acute findings identified in the upper abdomen.  IMPRESSION: 1.  Multiple bilateral airspace nodules, some of which may contain early / small areas of cavitation. This pattern is suspicious for septic emboli to the lungs.  Wegener's granulomatosis can have a similar appearance. Given that the patient may be immunocompromised, given a history of renal transplant, fungal infection cannot be excluded. 2.  Probable trace bilateral pleural effusions.   Original Report Authenticated By: Curlene Dolphin, M.D.    Dg Chest  Port 1 View  09/07/2012  *RADIOLOGY REPORT*  Clinical Data: Bilateral airspace disease.  PORTABLE CHEST - 1 VIEW  Comparison: CT chest 09/06/2012 and PA and lateral chest 09/04/2012.  Findings: Patchy bilateral airspace disease persists without marked change.  Heart size is normal.  No pneumothorax or pleural effusion.  IMPRESSION: No change in patchy bilateral airspace disease.   Original Report Authenticated By: Orlean Patten, M.D.     Scheduled Meds:   . [facility] HOLD] allopurinol  100 mg Oral Daily  . [MAR HOLD] amLODipine  10 mg Oral BID  . [facility] HOLD] aspirin EC  81 mg Oral Daily  . [facility] HOLD] azithromycin  500 mg Oral Daily  . [facility] HOLD] carvedilol  25 mg Oral BID  . [MAR HOLD] FLUoxetine  20 mg Oral Daily  . [facility] HOLD] gabapentin  100 mg Oral BID  . [MAR HOLD] heparin  5,000 Units Subcutaneous Q8H  . [MAR HOLD] insulin aspart  0-20 Units Subcutaneous TID WC  . [MAR HOLD] insulin aspart  0-5 Units Subcutaneous QHS  . [COMPLETED] insulin aspart  10 Units Subcutaneous Once  . [MAR HOLD] insulin aspart  20 Units Subcutaneous TID WC  . [MAR HOLD] insulin NPH  32 Units Subcutaneous QHS  . [facility] HOLD] levothyroxine  50 mcg Oral QAC breakfast  . [MAR HOLD] magnesium oxide  400 mg Oral TID  . [facility] HOLD] mycophenolate  720 mg Oral BID  . [MAR HOLD] piperacillin-tazobactam (ZOSYN)  IV  3.375 g Intravenous Q8H  . [MAR HOLD] predniSONE  2 mg Oral Daily  . [MAR HOLD] sodium chloride  3 mL Intravenous Q12H  . [facility] HOLD] spironolactone  50 mg Oral Daily  . [facility] HOLD] sulfamethoxazole-trimethoprim  1 tablet Oral Q M,W,F  . [MAR HOLD] tacrolimus  5 mg  Oral BID  . [facility] HOLD] valGANciclovir  900 mg Oral Daily  . [facility] HOLD] vancomycin  1,500 mg Intravenous Q24H   Continuous Infusions:     Time spent: 64    Catherina Pates, [place]  [facility] Pager [phone] If 8PM-8AM, please contact night-coverage at [email or web address], password [facility] 09/07/2012, 2:22 PM  LOS: 3 days

## 2012-09-07 NOTE — Progress Notes (Signed)
Bronchoscopy performed with intervention BAL

## 2012-09-07 NOTE — Progress Notes (Signed)
[facility]  Days of antibiotics: # 4 Days # 4 of zosyn Day # 4 Vancomycin Day # 2 azithromycin  Chronically on Bactrim MWF Chronically on Valcyte 900mg  daily  Subjective: Feels better sp bronchoscopy   Antibiotics:  Anti-infectives     Start     Dose/Rate Route Frequency Ordered Stop   09/06/12 1400   azithromycin (ZITHROMAX) tablet 500 mg        500 mg Oral Daily 09/06/12 1231     09/05/12 1900   vancomycin (VANCOCIN) 1,500 mg in sodium chloride 0.9 % 500 mL IVPB        1,500 mg 250 mL/hr over 120 Minutes Intravenous Every 24 hours 09/04/12 1810     09/05/12 1000  sulfamethoxazole-trimethoprim (BACTRIM DS) 800-160 MG per tablet 1 tablet       1 tablet Oral Every M-W-F 09/04/12 1841     09/05/12 1000   valGANciclovir (VALCYTE) 450 MG tablet TABS 900 mg  Status:  Discontinued        900 mg Oral Daily 09/04/12 1841 09/04/12 2014   09/05/12 1000   piperacillin-tazobactam (ZOSYN) IVPB 3.375 g        3.375 g 12.5 mL/hr over 240 Minutes Intravenous Every 8 hours 09/05/12 0306     09/05/12 0100   piperacillin-tazobactam (ZOSYN) IVPB 3.375 g  Status:  Discontinued        3.375 g 100 mL/hr over 30 Minutes Intravenous Every 8 hours 09/04/12 1810 09/05/12 0306   09/04/12 2030   valGANciclovir (VALCYTE) 450 MG tablet TABS 900 mg        900 mg Oral Daily 09/04/12 2014     09/04/12 1815   vancomycin (VANCOCIN) 500 mg in sodium chloride 0.9 % 100 mL IVPB        500 mg 100 mL/hr over 60 Minutes Intravenous NOW 09/04/12 1810 09/04/12 1945   09/04/12 1615   vancomycin (VANCOCIN) 1,500 mg in sodium chloride 0.9 % 500 mL IVPB        1,500 mg 250 mL/hr over 120 Minutes Intravenous  Once 09/04/12 1614 09/04/12 1935   09/04/12 1615   piperacillin-tazobactam (ZOSYN) IVPB 3.375 g        3.375 g 12.5 mL/hr over 240 Minutes Intravenous  Once 09/04/12 1614 09/04/12 1737          Medications: Scheduled Meds:   . allopurinol  100 mg Oral Daily  .  amLODipine  10 mg Oral BID  . aspirin EC  81 mg Oral Daily  . azithromycin  500 mg Oral Daily  . carvedilol  25 mg Oral BID  . FLUoxetine  20 mg Oral Daily  . gabapentin  100 mg Oral BID  . heparin  5,000 Units Subcutaneous Q8H  . insulin aspart  0-20 Units Subcutaneous TID WC  . insulin aspart  0-5 Units Subcutaneous QHS  . [COMPLETED] insulin aspart  10 Units Subcutaneous Once  . insulin aspart  20 Units Subcutaneous TID WC  . insulin NPH  32 Units Subcutaneous QHS  . levothyroxine  50 mcg Oral QAC breakfast  . magnesium oxide  400 mg Oral TID  . mycophenolate  720 mg Oral BID  . piperacillin-tazobactam (ZOSYN)  IV  3.375 g Intravenous Q8H  . predniSONE  2 mg Oral Daily  . sodium chloride  3 mL Intravenous Q12H  . spironolactone  50 mg Oral Daily  . sulfamethoxazole-trimethoprim  1 tablet Oral Q M,W,F  . tacrolimus  5  mg Oral BID  . valGANciclovir  900 mg Oral Daily  . vancomycin  1,500 mg Intravenous Q24H   Continuous Infusions:  PRN Meds:.acetaminophen, albuterol, butamben-tetracaine-benzocaine, fentaNYL, hydrALAZINE, lidocaine, midazolam, ondansetron (ZOFRAN) IV, ondansetron   Objective: Weight change: -8 lb 9.6 oz (-3.9 kg)  Intake/Output Summary (Last 24 hours) at 09/07/12 1544 Last data filed at 09/07/12 0913  Gross per 24 hour  Intake   1460 ml  Output    450 ml  Net   1010 ml   Blood pressure 155/71, pulse 95, temperature 100 F (37.8 C), temperature source Oral, resp. rate 18, height 5\' 10"  (1.778 m), weight 270 lb 11.6 oz (122.8 kg), SpO2 91.00%. Temp:  [96.7 F (35.9 C)-102.1 F (38.9 C)] 100 F (37.8 C) (12/06 1407) Pulse Rate:  [94-96] 95  (12/06 0912) Resp:  [12-29] 18  (12/06 1407) BP: (131-164)/(57-128) 155/71 mmHg (12/06 1407) SpO2:  [91 %-100 %] 91 % (12/06 1407) Weight:  [270 lb 11.6 oz (122.8 kg)] 270 lb 11.6 oz (122.8 kg) (12/05 2207)  Physical Exam: General: Alert and awake, oriented x3, not in any acute distress. HEENT: anicteric sclera,  pupils reactive to light and accommodation, EOMI CVS regular rate, normal r,  no murmur rubs or gallops Chest: clear to auscultation bilaterally, no wheezing, rales or rhonchi Abdomen: soft nontender, nondistended, normal bowel sounds, Extremities: no  clubbing or edema noted bilaterally Skin: no rashes Lymph: no new lymphadenopathy Neuro: nonfocal  Lab Results:  Basename 09/07/12 0900 09/06/12 0830  WBC 3.9* 6.2  HGB 9.5* 9.7*  HCT 28.7* 29.6*  PLT 153 176    BMET  Basename 09/07/12 0900 09/06/12 0830  NA 132* 132*  K 4.8 4.8  CL 103 102  CO2 18* 17*  GLUCOSE 213* 208*  BUN 27* 22  CREATININE 1.81* 1.50*  CALCIUM 8.5 8.6    Micro Results: Recent Results (from the past 240 hour(s))  URINE CULTURE     Status: Normal   Collection Time   09/04/12  2:43 PM      Component Value Range Status Comment   Specimen Description URINE, CLEAN CATCH   Final    Special Requests Immunocompromised   Final    Culture  Setup Time 09/05/2012 14:00   Final    Colony Count >=100,000 COLONIES/ML   Final    Culture KLEBSIELLA PNEUMONIAE   Final    Report Status 09/07/2012 FINAL   Final    Organism ID, Bacteria KLEBSIELLA PNEUMONIAE   Final   CULTURE, BLOOD (ROUTINE X 2)     Status: Normal (Preliminary result)   Collection Time   09/04/12  4:10 PM      Component Value Range Status Comment   Specimen Description BLOOD LEFT ARM   Final    Special Requests BOTTLES DRAWN AEROBIC AND ANAEROBIC 10CC   Final    Culture  Setup Time 09/04/2012 23:06   Final    Culture     Final    Value:        BLOOD CULTURE RECEIVED NO GROWTH TO DATE CULTURE WILL BE HELD FOR 5 DAYS BEFORE ISSUING A FINAL NEGATIVE REPORT   Report Status PENDING   Incomplete   CULTURE, BLOOD (ROUTINE X 2)     Status: Normal (Preliminary result)   Collection Time   09/04/12  4:20 PM      Component Value Range Status Comment   Specimen Description BLOOD LEFT HAND   Final    Special Requests BOTTLES DRAWN AEROBIC  AND ANAEROBIC 10CC    Final    Culture  Setup Time 09/04/2012 23:06   Final    Culture     Final    Value:        BLOOD CULTURE RECEIVED NO GROWTH TO DATE CULTURE WILL BE HELD FOR 5 DAYS BEFORE ISSUING A FINAL NEGATIVE REPORT   Report Status PENDING   Incomplete   MRSA PCR SCREENING     Status: Normal   Collection Time   09/04/12 11:56 PM      Component Value Range Status Comment   MRSA by PCR NEGATIVE  NEGATIVE Final   FUNGUS CULTURE W SMEAR     Status: Normal (Preliminary result)   Collection Time   09/07/12 11:18 AM      Component Value Range Status Comment   Specimen Description BRONCHIAL ALVEOLAR LAVAGE   Final    Special Requests LEFT UPPER LOBE LOOK FOR NOCARDIA   Final    Fungal Smear PENDING   Incomplete    Culture PENDING   Incomplete    Report Status PENDING   Incomplete   AFB CULTURE WITH SMEAR     Status: Normal (Preliminary result)   Collection Time   09/07/12 11:18 AM      Component Value Range Status Comment   Specimen Description BRONCHIAL ALVEOLAR LAVAGE   Final    Special Requests LEFT UPPER LOBE LOOK FOR NOCARDIA   Final    ACID FAST SMEAR PENDING   Incomplete    Culture PENDING   Incomplete    Report Status PENDING   Incomplete     Studies/Results: Ct Chest Wo Contrast  09/06/2012  *RADIOLOGY REPORT*  Clinical Data: Evaluate typical nodular opacities.  The patient has fevers, and a history of renal transplant.  CT CHEST WITHOUT CONTRAST  Technique:  Multidetector CT imaging of the chest was performed following the standard protocol without IV contrast.  Comparison: Chest radiograph 09/04/2012  Findings: Heart size is normal.  Thoracic aorta is normal in caliber and contour.  Thyroid gland normal.  There is no lymphadenopathy in the chest.  Trace bilateral pleural effusions.  There are multiple irregularly shaped nodular airspace opacities scattered throughout all lobes of both lungs. Some of these nodular airspace opacities are peripheral and some of the.  Wedge shaped appearance.   Cannot exclude a small areas of cavitation within some of these nodular airspace opacities.  For instance in the right middle lobe on image #39 of the lung windows in the right upper lobe on image number 30 left upper lobe on image number 23.  There is a larger area of focal consolidation in the left lower lobe.  No acute bony abnormality.  Imaged portion of the upper abdomen demonstrates cholecystectomy changes.  No acute findings identified in the upper abdomen.  IMPRESSION: 1.  Multiple bilateral airspace nodules, some of which may contain early / small areas of cavitation. This pattern is suspicious for septic emboli to the lungs.  Wegener's granulomatosis can have a similar appearance. Given that the patient may be immunocompromised, given a history of renal transplant, fungal infection cannot be excluded. 2.  Probable trace bilateral pleural effusions.   Original Report Authenticated By: Curlene Dolphin, M.D.    Dg Chest Port 1 View  09/07/2012  *RADIOLOGY REPORT*  Clinical Data: Bilateral airspace disease.  PORTABLE CHEST - 1 VIEW  Comparison: CT chest 09/06/2012 and PA and lateral chest 09/04/2012.  Findings: Patchy bilateral airspace disease persists without marked change.  Heart size is normal.  No pneumothorax or pleural effusion.  IMPRESSION: No change in patchy bilateral airspace disease.   Original Report Authenticated By: Orlean Patten, M.D.       Assessment/Plan: Wanda [name] is a 51 y.o. female with renal transplant with admission for fevers, malaise found to have multiple nodular opacities on CXR  Persistently febrile on vancomycin , zosyn and recently added azithromycin now with CT showing multiple cavitary nodules throughout both lungs, sp Bronchoscopy by Dr. Halford Chessman today. On Bronchoscopy there were NO endobronchial lesions. BAL was taken from the LUL and LLL and sent for afb, fungal, bacterial cultures, respiratory panel pCr, CMV pcr, viral culture.  #1 Multiple cavitary nodules in  renal transplant patient:  Certainly as Radiology point out this COULD be a picture of septic emboli due to endocarditis and I think   proceeding to TEE on Monday is a good idea and we have touched base with Trish from [facility].  The patient is covered broadly for usual suspects for bacterial endocarditis, HCAP, BUT I do worry about fungal infections with an endemic fungus vs a mould such as aspergillus. Serum crypto ag negative, histoplasma ag pending. I will send a Beta D glucan as well (galactomannan not helpful in this pt on zosyn)  I WILL ASK pharmacy to start VORICONAZOLE which we may wish to load orally given cyclodextrin component in IV form and pts creatinine  I discussed case with Transplant Surgeon at [facility] and per his advice I will halve the myfortic to 360mg  bid  I spent greater than 45 minutes with the patient including greater than 50% of time in face to face counsel of the patient and in coordination of their care.          LOS: 3 days   Alcide Evener 09/07/2012, 3:44 PM

## 2012-09-07 NOTE — Progress Notes (Signed)
ANTIBIOTIC CONSULT NOTE - follow up Pharmacy Consult:  Vancomycin / Zosyn. Also on azithromycin Indication: atypical PNA  Allergies  Allergen Reactions  . Hydrocodone-Acetaminophen Other (See Comments)    REACTION: headaches  . Propoxyphene-Acetaminophen Other (See Comments)    REACTION: headache    Patient Measurements: Height: 5\' 10"  (177.8 cm) Weight: 270 lb 11.6 oz (122.8 kg) IBW/kg (Calculated) : 68.5   Vital Signs: Temp: 100 F (37.8 C) (12/06 1407) Temp src: Oral (12/06 1149) BP: 155/71 mmHg (12/06 1407) Pulse Rate: 95  (12/06 0912)  Labs:  Basename 09/07/12 0900 09/06/12 0830 09/05/12 0657  WBC 3.9* 6.2 5.4  HGB 9.5* 9.7* 9.3*  PLT 153 176 166  LABCREA -- -- --  CREATININE 1.81* 1.50* 1.55*   Estimated Creatinine Clearance: 51.8 ml/min (by C-G formula based on Cr of 1.81). No results found for this basename: VANCOTROUGH:2,VANCOPEAK:2,VANCORANDOM:2,GENTTROUGH:2,GENTPEAK:2,GENTRANDOM:2,TOBRATROUGH:2,TOBRAPEAK:2,TOBRARND:2,AMIKACINPEAK:2,AMIKACINTROU:2,AMIKACIN:2, in the last 72 hours   Microbiology: Recent Results (from the past 720 hour(s))  URINE CULTURE     Status: Normal   Collection Time   09/04/12  2:43 PM      Component Value Range Status Comment   Specimen Description URINE, CLEAN CATCH   Final    Special Requests Immunocompromised   Final    Culture  Setup Time 09/05/2012 14:00   Final    Colony Count >=100,000 COLONIES/ML   Final    Culture KLEBSIELLA PNEUMONIAE   Final    Report Status 09/07/2012 FINAL   Final    Organism ID, Bacteria KLEBSIELLA PNEUMONIAE   Final   CULTURE, BLOOD (ROUTINE X 2)     Status: Normal (Preliminary result)   Collection Time   09/04/12  4:10 PM      Component Value Range Status Comment   Specimen Description BLOOD LEFT ARM   Final    Special Requests BOTTLES DRAWN AEROBIC AND ANAEROBIC 10CC   Final    Culture  Setup Time 09/04/2012 23:06   Final    Culture     Final    Value:        BLOOD CULTURE RECEIVED NO GROWTH TO  DATE CULTURE WILL BE HELD FOR 5 DAYS BEFORE ISSUING A FINAL NEGATIVE REPORT   Report Status PENDING   Incomplete   CULTURE, BLOOD (ROUTINE X 2)     Status: Normal (Preliminary result)   Collection Time   09/04/12  4:20 PM      Component Value Range Status Comment   Specimen Description BLOOD LEFT HAND   Final    Special Requests BOTTLES DRAWN AEROBIC AND ANAEROBIC 10CC   Final    Culture  Setup Time 09/04/2012 23:06   Final    Culture     Final    Value:        BLOOD CULTURE RECEIVED NO GROWTH TO DATE CULTURE WILL BE HELD FOR 5 DAYS BEFORE ISSUING A FINAL NEGATIVE REPORT   Report Status PENDING   Incomplete   MRSA PCR SCREENING     Status: Normal   Collection Time   09/04/12 11:56 PM      Component Value Range Status Comment   MRSA by PCR NEGATIVE  NEGATIVE Final    Assessment: 3 YOF with recent renal transplant in 05/2012 admitted with chief complaints of fever, cough and congestion.  She is on day # 4 of vancomycin and zosyn and day # 2 of azithromycin for PNA.  Her CXR showed B nodular opacities and CT of chest w/ multiple b/l airspace nodules suspicious  for septic emboli, possible for wegener's granulomatosis and cannot r/o fungal infection.  T max 102.1. Influenza negative.  Urine + Klebsiealla which is covered by zosyn.   Creat = 1.81. WBC 3.9. S/p bronchoscopy with bx today.  12/3 vanc>> 12.3 zosyn?? 12/5 azithromycin >>   12/3 Urine cx > 100 K Klebsiealla - sens to zosyn   Goal of Therapy:  Vancomycin trough level 15-20 mcg/ml  Plan:  - vancomycin 1500mg  IV Q24H- next dose due tonight at 2200 - Zosyn 3.375gm IV Q8H, 4 hr infusion - Monitor renal function, clinical course, - vanc trough Saturday at [facility], Pharm.D. [id number] 09/07/2012 3:21 PM

## 2012-09-07 NOTE — Progress Notes (Signed)
ANTIBIOTIC CONSULT NOTE - INITIAL  Pharmacy Consult for Voriconazole Indication: r/o fungal infection  Assessment: Asked to start oral Voriconazole load and maintenance per pharmacy dosing for possible fungal infection with recent renal transplant and multiple cavitary nodules. ID physician wants to avoid IV voriconazole due to cyclodextrin component in IV form and patient's worsening serum creatinine.   Drug-Drug Interaction between Voriconazole and Prograf: Increased risk of QT prolongation. When initiating therapy with voriconazole in patients already receiving tacrolimus, the manufacturer recommends that the tacrolimus dose be reduced to one-third of the original dose and followed with frequent monitoring of tacrolimus blood levels (5). When voriconazole is discontinued, tacrolimus levels should be carefully monitored to guide dosage increases. Discussed with Dr. Tommy Medal- he agrees with following the manufacturer's recommendations to dose reduce the prograf.   Drug-Drug Interaction between Voriconazole and Azithromycin: Increased risk of QT prolongation. Discussed with Dr. Tommy Medal- he is going to stop the azithromycin at this time.   Drug-Drug Interaction between Prozac and Voriconazole: Increased risk of QT prolongation. Similar potential with Bactrim and Voriconazole.    Plan: 1. Start Voriconazole po load at 400mg  po q12h x2 doses, then maintenance dose of 200mg  po q12h.  2. Reduce prograf to 3 mg BID (~1/3rd reduction per manufacturer recommendations).  3. Verified Azithromycin has been discontinued.  4. Recommend close monitoring of QT interval while on voriconazole + Prograf + Prozac + Bactrim.    Allergies  Allergen Reactions  . Hydrocodone-Acetaminophen Other (See Comments)    REACTION: headaches  . Propoxyphene-Acetaminophen Other (See Comments)    REACTION: headache  Patient Measurements: Height: 5\' 10"  (177.8 cm) Weight: 270 lb 11.6 oz (122.8 kg) IBW/kg (Calculated) :  68.5  Vital Signs: Temp: 100 F (37.8 C) (12/06 1630) Temp src: Oral (12/06 1149) BP: 162/63 mmHg (12/06 1630) Pulse Rate: 105  (12/06 1630) Intake/Output from previous day: 12/05 0701 - 12/06 0700 In: [id number] [P.O.:480; IV Piggyback:1100] Out: 450 [Urine:450] Labs:  Basename 09/07/12 0900 09/06/12 0830 09/05/12 0657  WBC 3.9* 6.2 5.4  HGB 9.5* 9.7* 9.3*  PLT 153 176 166  LABCREA -- -- --  CREATININE 1.81* 1.50* 1.55*   Microbiology: Recent Results (from the past 720 hour(s))  URINE CULTURE     Status: Normal   Collection Time   09/04/12  2:43 PM      Component Value Range Status Comment   Specimen Description URINE, CLEAN CATCH   Final    Special Requests Immunocompromised   Final    Culture  Setup Time 09/05/2012 14:00   Final    Colony Count >=100,000 COLONIES/ML   Final    Culture KLEBSIELLA PNEUMONIAE   Final    Report Status 09/07/2012 FINAL   Final    Organism ID, Bacteria KLEBSIELLA PNEUMONIAE   Final   CULTURE, BLOOD (ROUTINE X 2)     Status: Normal (Preliminary result)   Collection Time   09/04/12  4:10 PM      Component Value Range Status Comment   Specimen Description BLOOD LEFT ARM   Final    Special Requests BOTTLES DRAWN AEROBIC AND ANAEROBIC 10CC   Final    Culture  Setup Time 09/04/2012 23:06   Final    Culture     Final    Value:        BLOOD CULTURE RECEIVED NO GROWTH TO DATE CULTURE WILL BE HELD FOR 5 DAYS BEFORE ISSUING A FINAL NEGATIVE REPORT   Report Status PENDING   Incomplete  CULTURE, BLOOD (ROUTINE X 2)     Status: Normal (Preliminary result)   Collection Time   09/04/12  4:20 PM      Component Value Range Status Comment   Specimen Description BLOOD LEFT HAND   Final    Special Requests BOTTLES DRAWN AEROBIC AND ANAEROBIC 10CC   Final    Culture  Setup Time 09/04/2012 23:06   Final    Culture     Final    Value:        BLOOD CULTURE RECEIVED NO GROWTH TO DATE CULTURE WILL BE HELD FOR 5 DAYS BEFORE ISSUING A FINAL NEGATIVE REPORT   Report  Status PENDING   Incomplete   MRSA PCR SCREENING     Status: Normal   Collection Time   09/04/12 11:56 PM      Component Value Range Status Comment   MRSA by PCR NEGATIVE  NEGATIVE Final   FUNGUS CULTURE W SMEAR     Status: Normal (Preliminary result)   Collection Time   09/07/12 11:18 AM      Component Value Range Status Comment   Specimen Description BRONCHIAL ALVEOLAR LAVAGE   Final    Special Requests LEFT UPPER LOBE LOOK FOR NOCARDIA   Final    Fungal Smear PENDING   Incomplete    Culture PENDING   Incomplete    Report Status PENDING   Incomplete   AFB CULTURE WITH SMEAR     Status: Normal (Preliminary result)   Collection Time   09/07/12 11:18 AM      Component Value Range Status Comment   Specimen Description BRONCHIAL ALVEOLAR LAVAGE   Final    Special Requests LEFT UPPER LOBE LOOK FOR NOCARDIA   Final    ACID FAST SMEAR PENDING   Incomplete    Culture PENDING   Incomplete    Report Status PENDING   Incomplete    Medications:  Scheduled:    . sodium chloride  250 mL Intravenous Once  . allopurinol  100 mg Oral Daily  . amLODipine  10 mg Oral BID  . aspirin EC  81 mg Oral Daily  . carvedilol  25 mg Oral BID  . FLUoxetine  20 mg Oral Daily  . gabapentin  100 mg Oral BID  . heparin  5,000 Units Subcutaneous Q8H  . insulin aspart  0-20 Units Subcutaneous TID WC  . insulin aspart  0-5 Units Subcutaneous QHS  . [COMPLETED] insulin aspart  10 Units Subcutaneous Once  . insulin aspart  20 Units Subcutaneous TID WC  . insulin NPH  32 Units Subcutaneous QHS  . levothyroxine  50 mcg Oral QAC breakfast  . magnesium oxide  400 mg Oral TID  . mycophenolate  360 mg Oral BID  . piperacillin-tazobactam (ZOSYN)  IV  3.375 g Intravenous Q8H  . predniSONE  2 mg Oral Daily  . sodium chloride  3 mL Intravenous Q12H  . spironolactone  50 mg Oral Daily  . sulfamethoxazole-trimethoprim  1 tablet Oral Q M,W,F  . valGANciclovir  900 mg Oral Daily  . vancomycin  1,500 mg Intravenous Q24H   . voriconazole  200 mg Oral Q12H  . voriconazole  400 mg Oral Q12H  . [DISCONTINUED] azithromycin  500 mg Oral Daily  . [DISCONTINUED] butamben-tetracaine-benzocaine  1 spray Topical Once  . [DISCONTINUED] lidocaine   Topical Once  . [DISCONTINUED] mycophenolate  720 mg Oral BID  . [DISCONTINUED] tacrolimus  5 mg Oral BID   Sloan Leiter, PharmD, BCPS  Clinical Pharmacist [phone] 09/07/2012,5:21 PM

## 2012-09-07 NOTE — Op Note (Signed)
Bronchoscopy Procedure Note  Wanda [name]  QU:[id number]  [birth year]  Pre-operative diagnosis: Atypical pneumonia.  Post-operative diagnosis: Same.  Procedure: Bronchoscopy  Indications: Obtain specimens for culture and/or other diagnostic studies  Procedure Details  Consent: Risks of procedure as well as the alternatives and risks of each were explained to the patient. Consent for procedure obtained.  Time Out: Verified patient identification, verified procedure, site/side was marked, verified correct patient position, special equipment/implants available, medications/allergies/relevent history reviewed, required imaging and test results available. Performed  In preparation for procedure, patient was given 100% FiO2.  Sedation: 6 mg versed, 100 mcg fentanyl.  Bronchoscope entered orally. Vocal cord visualized with normal movement. 8 ml 1% lidocaine for airway anesthesia. Bronchoscope passed through vocal cords. Rt upper, middle, lower lobes visualized. No endobronchial lesions.  Lt upper, lingular, and lower lobes visualized. No endobronchial lesions.  Bronchoscope wedged into Lt upper lobe. 60 ml saline instilled with 15 ml fluid returned. Bronchoscope then wedged into LLL. 60 ml of saline instilled with 20 ml of fluid returned.  Evaluation  Hemodynamic Status: BP stable throughout; O2 sats: stable throughout  Patient's Current Condition: stable  Specimens: Sent serosanguinous fluid  Complications: No apparent complications  Patient did tolerate procedure well.  Chesley Mires, MD  [facility] Pulmonary/Critical Care  09/07/2012, 11:26 AM  Pager: [phone]  After 3pm call: [phone]

## 2012-09-08 NOTE — Progress Notes (Signed)
Report called to  [place]  RN  For  Room 3116.  Patient  Remains stable for  Discharge/transport.

## 2012-09-09 LAB — RESPIRATORY VIRUS PANEL
Adenovirus: NOT DETECTED
Adenovirus: NOT DETECTED
Influenza A H1: NOT DETECTED
Influenza A H1: NOT DETECTED
Influenza A H3: NOT DETECTED
Influenza A H3: NOT DETECTED
Influenza A: NOT DETECTED
Influenza A: NOT DETECTED
Influenza B: NOT DETECTED
Influenza B: NOT DETECTED
Metapneumovirus: NOT DETECTED
Metapneumovirus: NOT DETECTED
Parainfluenza 1: NOT DETECTED
Parainfluenza 1: NOT DETECTED
Parainfluenza 2: NOT DETECTED
Parainfluenza 2: NOT DETECTED
Parainfluenza 3: NOT DETECTED
Parainfluenza 3: NOT DETECTED
Respiratory Syncytial Virus A: NOT DETECTED
Respiratory Syncytial Virus A: NOT DETECTED
Respiratory Syncytial Virus B: NOT DETECTED
Respiratory Syncytial Virus B: NOT DETECTED
Rhinovirus: NOT DETECTED
Rhinovirus: NOT DETECTED

## 2012-09-10 ENCOUNTER — Encounter (HOSPITAL_COMMUNITY): Payer: Self-pay | Admitting: Pulmonary Disease

## 2012-09-10 LAB — CULTURE, BLOOD (ROUTINE X 2)
Culture: NO GROWTH
Culture: NO GROWTH

## 2012-09-10 LAB — PNEUMOCYSTIS JIROVECI SMEAR BY DFA: Pneumocystis jiroveci Ag: NEGATIVE

## 2012-09-10 LAB — CULTURE, RESPIRATORY W GRAM STAIN

## 2012-09-10 LAB — CULTURE, RESPIRATORY

## 2012-09-10 LAB — PATHOLOGIST SMEAR REVIEW

## 2012-09-11 LAB — HISTOPLASMA ANTIGEN, URINE: Histoplasma Antigen, urine: <0.5 ng/mL

## 2012-09-17 LAB — VIRAL CULTURE VIRC

## 2012-09-21 LAB — CULTURE, RESPIRATORY

## 2012-09-21 LAB — CULTURE, RESPIRATORY W GRAM STAIN

## 2012-10-01 LAB — FUNGUS CULTURE W SMEAR
Fungal Smear: NONE SEEN
Fungal Smear: NONE SEEN

## 2012-10-02 LAB — CYTOMEGALOVIRUS PCR, QUALITATIVE
Cytomegalovirus DNA: NOT DETECTED
Cytomegalovirus DNA: NOT DETECTED

## 2012-10-22 LAB — AFB CULTURE WITH SMEAR (NOT AT ARMC)
Acid Fast Smear: NONE SEEN
Acid Fast Smear: NONE SEEN

## 2012-10-31 ENCOUNTER — Emergency Department (HOSPITAL_COMMUNITY): Payer: 59

## 2012-10-31 ENCOUNTER — Encounter (HOSPITAL_COMMUNITY): Payer: Self-pay | Admitting: Emergency Medicine

## 2012-10-31 ENCOUNTER — Emergency Department (HOSPITAL_COMMUNITY)
Admission: EM | Admit: 2012-10-31 | Discharge: 2012-11-01 | Disposition: A | Discharge status: Other Acute Inpatient Hospital | Payer: 59 | Attending: Emergency Medicine | Admitting: Emergency Medicine

## 2012-10-31 DIAGNOSIS — Z940 Kidney transplant status: Secondary | ICD-10-CM

## 2012-10-31 DIAGNOSIS — N189 Chronic kidney disease, unspecified: Secondary | ICD-10-CM

## 2012-10-31 DIAGNOSIS — E669 Obesity, unspecified: Secondary | ICD-10-CM | POA: Insufficient documentation

## 2012-10-31 DIAGNOSIS — N179 Acute kidney failure, unspecified: Secondary | ICD-10-CM

## 2012-10-31 DIAGNOSIS — E785 Hyperlipidemia, unspecified: Secondary | ICD-10-CM | POA: Insufficient documentation

## 2012-10-31 DIAGNOSIS — D72829 Elevated white blood cell count, unspecified: Secondary | ICD-10-CM

## 2012-10-31 DIAGNOSIS — I10 Essential (primary) hypertension: Secondary | ICD-10-CM | POA: Insufficient documentation

## 2012-10-31 DIAGNOSIS — E039 Hypothyroidism, unspecified: Secondary | ICD-10-CM | POA: Insufficient documentation

## 2012-10-31 DIAGNOSIS — B029 Zoster without complications: Secondary | ICD-10-CM | POA: Insufficient documentation

## 2012-10-31 DIAGNOSIS — E119 Type 2 diabetes mellitus without complications: Secondary | ICD-10-CM | POA: Insufficient documentation

## 2012-10-31 DIAGNOSIS — N19 Unspecified kidney failure: Secondary | ICD-10-CM | POA: Insufficient documentation

## 2012-10-31 LAB — URINALYSIS, ROUTINE W REFLEX MICROSCOPIC
Bilirubin Urine: NEGATIVE
Glucose, UA: 500 mg/dL — AB
Ketones, ur: NEGATIVE mg/dL
Nitrite: NEGATIVE
Protein, ur: 100 mg/dL — AB
Specific Gravity, Urine: 1.015 (ref 1.005–1.030)
Urobilinogen, UA: 0.2 mg/dL (ref 0.0–1.0)
pH: 5.5 (ref 5.0–8.0)

## 2012-10-31 LAB — CBC WITH DIFFERENTIAL/PLATELET
Basophils Absolute: 0.3 10*3/uL — ABNORMAL HIGH (ref 0.0–0.1)
Basophils Relative: 1 % (ref 0–1)
Eosinophils Absolute: 0.3 10*3/uL (ref 0.0–0.7)
Eosinophils Relative: 1 % (ref 0–5)
HCT: 33.6 % — ABNORMAL LOW (ref 36.0–46.0)
Hemoglobin: 11.4 g/dL — ABNORMAL LOW (ref 12.0–15.0)
Lymphocytes Relative: 7 % — ABNORMAL LOW (ref 12–46)
Lymphs Abs: 1.8 10*3/uL (ref 0.7–4.0)
MCH: 26.8 pg (ref 26.0–34.0)
MCHC: 33.9 g/dL (ref 30.0–36.0)
MCV: 78.9 fL (ref 78.0–100.0)
Monocytes Absolute: 0.8 10*3/uL (ref 0.1–1.0)
Monocytes Relative: 3 % (ref 3–12)
Neutro Abs: 23.2 10*3/uL — ABNORMAL HIGH (ref 1.7–7.7)
Neutrophils Relative %: 88 % — ABNORMAL HIGH (ref 43–77)
Platelets: 408 10*3/uL — ABNORMAL HIGH (ref 150–400)
RBC: 4.26 MIL/uL (ref 3.87–5.11)
RDW: 15.1 % (ref 11.5–15.5)
WBC: 26.4 10*3/uL — ABNORMAL HIGH (ref 4.0–10.5)

## 2012-10-31 LAB — URINE MICROSCOPIC-ADD ON

## 2012-10-31 LAB — COMPREHENSIVE METABOLIC PANEL
ALT: 10 U/L (ref 0–35)
AST: 12 U/L (ref 0–37)
Albumin: 3.3 g/dL — ABNORMAL LOW (ref 3.5–5.2)
Alkaline Phosphatase: 174 U/L — ABNORMAL HIGH (ref 39–117)
BUN: 41 mg/dL — ABNORMAL HIGH (ref 6–23)
CO2: 17 mEq/L — ABNORMAL LOW (ref 19–32)
Calcium: 10 mg/dL (ref 8.4–10.5)
Chloride: 105 mEq/L (ref 96–112)
Creatinine, Ser: 2.37 mg/dL — ABNORMAL HIGH (ref 0.50–1.10)
GFR calc Af Amer: 26 mL/min — ABNORMAL LOW (ref >90)
GFR calc non Af Amer: 22 mL/min — ABNORMAL LOW (ref >90)
Glucose, Bld: 68 mg/dL — ABNORMAL LOW (ref 70–99)
Potassium: 4.6 mEq/L (ref 3.5–5.1)
Sodium: 135 mEq/L (ref 135–145)
Total Bilirubin: 0.2 mg/dL — ABNORMAL LOW (ref 0.3–1.2)
Total Protein: 8.6 g/dL — ABNORMAL HIGH (ref 6.0–8.3)

## 2012-10-31 LAB — GLUCOSE, CAPILLARY: Glucose-Capillary: 63 mg/dL — ABNORMAL LOW (ref 70–99)

## 2012-10-31 MED ORDER — DEXTROSE 5 % IV SOLN
1.0000 g | Freq: Once | INTRAVENOUS | Status: AC
  Administered 2012-10-31: 1 g via INTRAVENOUS
  Filled 2012-10-31: qty 10

## 2012-10-31 MED ORDER — SODIUM CHLORIDE 0.9 % IV BOLUS (SEPSIS)
1000.0000 mL | Freq: Once | INTRAVENOUS | Status: AC
  Administered 2012-10-31: 1000 mL via INTRAVENOUS

## 2012-10-31 NOTE — ED Notes (Signed)
[facility] Transfer Dr. Theda Sers to return call to [place] @ [facility] [address] [address]

## 2012-10-31 NOTE — ED Notes (Signed)
Pt stated that she think her sugar is getting low. Sugar checked in at 52. Dr advised and PA went to see pt. Pa asked to get the pt some orange juice and something to eat to get her sugar up.

## 2012-10-31 NOTE — ED Provider Notes (Signed)
This chart was scribed for Ezequiel Essex, MD by Roe Coombs, ED Scribe. The patient was seen in room A02C/A02C. Patient evaluated at 9:56 PM.  Wanda [name] is a 52 y.o. female with history of diabetes, renal disease and peripheral artery disease here with elevated creatinine from labs drawn 2 days ago at PCP office. Patient was told to come to the ED for evaluation. At this time, she also reports dysuria and increased urinary frequency onset 2 days ago. Patient denies fever, abdominal pain, flank pain or vomiting. Patient had right renal transplant surgery on May 18, 2012 at [place].   Physical Exam: Abdomen soft and nontender. No CVA pain.   Nephrologist - Lissa Merlin ([place]) PCP - Mancel Bale [facility], [place])  Status post kidney transplant August 2013 presenting with elevated creatinine, leukocytosis, urinary tract infection. Abdomen is soft, nontender, afebrile.  Transfer discussed with transplant surgery at [facility]. Patient accepted by Dr. Theda Sers.  I personally performed the services described in this documentation, which was scribed in my presence. The recorded information has been reviewed and is accurate.    Ezequiel Essex, MD 11/01/12 [phone]

## 2012-10-31 NOTE — ED Notes (Signed)
Pt st's she had lab drawn on Mon and was called today and told to come to ED ref. Elevated creatine.  Pt had kidney transplant in Aug.2013

## 2012-10-31 NOTE — ED Provider Notes (Signed)
History     CSN: [id number]  Arrival date & time 10/31/12  1727   First MD Initiated Contact with Patient 10/31/12 1951      Chief Complaint  Patient presents with  . Abnormal Lab    (Consider location/radiation/quality/duration/timing/severity/associated sxs/prior treatment) The history is provided by the patient and medical records.    Wanda [name] is a 52 y.o. female  with a hx of HLD, IDDM, kidney transplant, HTN, migraines, PAD, peripheral neuropathy, CKD presents to the Emergency Department complaining of gradual, persistent, progressively worsening abnormal lab values onset Today.  Pt was seen by her PCP on Monday with abnormal lab values reported to the pt today.  2/2 to it being after hours, pt was instructed to report to the ED for further evaluation and recheck of her lab values. Associated symptoms include frequency, cloudy urine, urgency, hematuria.  Pt states she gets UTIs frequently.  Nothing makes it better and nothing makes it worse.  Pt denies fever, chills, headache, neck pain, chest pain, abdominal pain, nausea, vomiting, diarrhea, dysuria, weakness, dizziness, syncope.  .     Past Medical History  Diagnosis Date  . Hyperlipidemia   . Unspecified essential hypertension   . Shingles   . Obesity   . Hypothyroidism   . Diabetes mellitus type II   . Nonspecific abnormal unspecified cardiovascular function study   . Foot pain   . Renal transplant disorder   . Migraines   . Peripheral vascular disease   . PAD (peripheral artery disease)   . Diabetic peripheral neuropathy   . Dyspnea on exertion   . Anemia   . Chronic kidney disease, stage IV (severe)     "never went on dialysis" (09/04/2012)  . Arthritis     "hands" (09/04/2012)  . Gout     Past Surgical History  Procedure Date  . Av fistula placement, radiocephalic [id number]    "right upper arm" (09/04/2012)  . Cesarean section 1986; 1987  . Endometrial cryoablation 1980's  . Colonoscopy   . Av fistula  placement   . Transplantation renal 2013    "right" (09/04/2012)  . Cholecystectomy 2011  . Varicose vein surgery 1997    "RLE; stripped" (09/04/2012)  . Video bronchoscopy 09/07/2012    Procedure: VIDEO BRONCHOSCOPY WITHOUT FLUORO;  Surgeon: Chesley Mires, MD;  Location: [facility] ENDOSCOPY;  Service: Cardiopulmonary;  Laterality: Bilateral;    Family History  Problem Relation Age of Onset  . Colon cancer Father     deceased at age 39    History  Substance Use Topics  . Smoking status: Never Smoker   . Smokeless tobacco: Never Used  . Alcohol Use: No    OB History    Grav Para Term Preterm Abortions TAB SAB Ect Mult Living                  Review of Systems  Constitutional: Negative for fever, diaphoresis, appetite change, fatigue and unexpected weight change.  HENT: Negative for mouth sores and neck stiffness.   Eyes: Negative for visual disturbance.  Respiratory: Negative for cough, chest tightness, shortness of breath and wheezing.   Cardiovascular: Negative for chest pain.  Gastrointestinal: Negative for nausea, vomiting, abdominal pain, diarrhea and constipation.  Genitourinary: Positive for urgency, frequency and hematuria. Negative for dysuria.  Musculoskeletal: Negative for back pain.  Skin: Negative for rash.  Neurological: Negative for syncope, light-headedness and headaches.  Hematological: Does not bruise/bleed easily.  Psychiatric/Behavioral: Negative for sleep disturbance. The patient is  not nervous/anxious.   All other systems reviewed and are negative.    Allergies  Hydrocodone-acetaminophen and Propoxyphene-acetaminophen  Home Medications   Current Outpatient Rx  Name  Route  Sig  Dispense  Refill  . ALLOPURINOL 100 MG PO TABS   Oral   Take 100 mg by mouth daily.         [name] Kitchen AMLODIPINE BESYLATE 10 MG PO TABS   Oral   Take 5 mg by mouth 2 (two) times daily.          . ASPIRIN EC 81 MG PO TBEC   Oral   Take 81 mg by mouth daily.         [name] Kitchen  CARVEDILOL 25 MG PO TABS   Oral   Take 37.5 mg by mouth 2 (two) times daily.          . ERGOCALCIFEROL 50000 UNITS PO CAPS   Oral   Take 50,000 Units by mouth every 30 (thirty) days.          . FUROSEMIDE 80 MG PO TABS   Oral   Take 40 mg by mouth 2 (two) times daily.          [name] Kitchen HYDRALAZINE HCL 25 MG PO TABS   Oral   Take 37.5 mg by mouth 3 (three) times daily.         . INSULIN ISOPHANE HUMAN 100 UNIT/ML [place] SUSP   Subcutaneous   Inject 28 Units into the skin at bedtime.          . INSULIN REGULAR HUMAN 100 UNIT/ML IJ SOLN   Subcutaneous   Inject 24-28 Units into the skin 3 (three) times daily before meals. 28 units in the morning, 24 units at noon, and 24 units at 5pm.         . LEVOTHYROXINE SODIUM 50 MCG PO TABS   Oral   Take 50 mcg by mouth daily.          [name] Kitchen MAGNESIUM OXIDE 400 MG PO TABS   Oral   Take 400 mg by mouth 3 (three) times daily.         [name] Kitchen MYCOPHENOLATE SODIUM 360 MG PO TBEC   Oral   Take 2 tablets (720 mg total) by mouth 2 (two) times daily.         [name] Kitchen OMEPRAZOLE 20 MG PO CPDR   Oral   Take 20 mg by mouth daily.         [name] Kitchen POTASSIUM CHLORIDE ER 10 MEQ PO TBCR   Oral   Take 10 mEq by mouth 2 (two) times daily.         [name] Kitchen PREDNISONE 1 MG PO TABS   Oral   Take 6 mg by mouth daily.          [name] Kitchen PREGABALIN 150 MG PO CAPS   Oral   Take 150 mg by mouth 2 (two) times daily.         [name] Kitchen SPIRONOLACTONE 50 MG PO TABS   Oral   Take 50 mg by mouth daily.          [name] DS 800-160 MG PO TABS   Oral   Take 1 tablet by mouth every Monday, Wednesday, and Friday.         [name] Kitchen TACROLIMUS 1 MG PO CAPS   Oral   Take 6 mg by mouth 2 (two) times daily.          . TRAMADOL HCL 50 MG  PO TABS   Oral   Take 50 mg by mouth at bedtime. For pain         . VALGANCICLOVIR HCL 450 MG PO TABS   Oral   Take 900 mg by mouth daily.         [name] Kitchen VORICONAZOLE 50 MG PO TABS   Oral   Take 50 mg by mouth 2 (two) times daily.            BP 142/64  Pulse 77  Temp 98.7 F (37.1 C) (Oral)  Resp 20  SpO2 100%  Physical Exam  Nursing note and vitals reviewed. Constitutional: She is oriented to person, place, and time. She appears well-developed and well-nourished. No distress.  HENT:  Head: Normocephalic and atraumatic.  Right Ear: Tympanic membrane, external ear and ear canal normal.  Left Ear: Tympanic membrane, external ear and ear canal normal.  Nose: Nose normal.  Mouth/Throat: Uvula is midline, oropharynx is clear and moist and mucous membranes are normal. No oropharyngeal exudate, posterior oropharyngeal edema, posterior oropharyngeal erythema or tonsillar abscesses.  Eyes: Conjunctivae normal and EOM are normal. Pupils are equal, round, and reactive to light. No scleral icterus.  Neck: Normal range of motion and full passive range of motion without pain. Neck supple. No spinous process tenderness and no muscular tenderness present. Normal range of motion present.  Cardiovascular: Normal rate, regular rhythm, normal heart sounds and intact distal pulses.  Exam reveals no gallop and no friction rub.   No murmur heard. Pulmonary/Chest: Effort normal and breath sounds normal. No respiratory distress. She has no wheezes. She has no rales. She exhibits no tenderness.  Abdominal: Soft. Bowel sounds are normal. She exhibits no distension and no mass. There is no tenderness. There is no rebound and no guarding.  Musculoskeletal: Normal range of motion. She exhibits no edema and no tenderness.  Lymphadenopathy:    She has no cervical adenopathy.  Neurological: She is alert and oriented to person, place, and time. She exhibits normal muscle tone. Coordination normal.       Speech is clear and goal oriented Moves extremities without ataxia  Skin: Skin is warm. No rash noted. She is diaphoretic. No erythema.  Psychiatric: She has a normal mood and affect.    ED Course  Procedures (including critical care  time)  Labs Reviewed  CBC WITH DIFFERENTIAL - Abnormal; Notable for the following:    WBC 26.4 (*)     Hemoglobin 11.4 (*)     HCT 33.6 (*)     Platelets 408 (*)     Neutrophils Relative 88 (*)     Lymphocytes Relative 7 (*)     Neutro Abs 23.2 (*)     Basophils Absolute 0.3 (*)     All other components within normal limits  COMPREHENSIVE METABOLIC PANEL - Abnormal; Notable for the following:    CO2 17 (*)     Glucose, Bld 68 (*)     BUN 41 (*)     Creatinine, Ser 2.37 (*)     Total Protein 8.6 (*)     Albumin 3.3 (*)     Alkaline Phosphatase 174 (*)     Total Bilirubin 0.2 (*)     GFR calc non Af Amer 22 (*)     GFR calc Af Amer 26 (*)     All other components within normal limits  URINALYSIS, ROUTINE W REFLEX MICROSCOPIC - Abnormal; Notable for the following:    Glucose, UA 500 (*)  Hgb urine dipstick TRACE (*)     Protein, ur 100 (*)     Leukocytes, UA TRACE (*)     All other components within normal limits  GLUCOSE, CAPILLARY - Abnormal; Notable for the following:    Glucose-Capillary 63 (*)     All other components within normal limits  URINE MICROSCOPIC-ADD ON - Abnormal; Notable for the following:    Bacteria, UA FEW (*)     Casts HYALINE CASTS (*)     All other components within normal limits  URINE CULTURE   Dg Chest 2 View  10/31/2012  *RADIOLOGY REPORT*  Clinical Data: 52 year old female with elevated white count.  CHEST - 2 VIEW  Comparison: 09/07/2012 and prior chest radiographs  Findings: Upper limits normal heart size again noted. There is no evidence of focal airspace disease, pulmonary edema, suspicious pulmonary nodule/mass, pleural effusion, or pneumothorax. No acute bony abnormalities are identified.  IMPRESSION: No evidence of active cardiopulmonary disease.   Original Report Authenticated By: Margarette Canada, M.D.      1. Acute renal failure   2. CKD (chronic kidney disease)   3. HTN (hypertension)   4. DIABETES MELLITUS, TYPE II   5.  HYPERLIPIDEMIA   6. Leukocytosis   7. H/O kidney transplant       MDM  Hermelinda Dellen presents with c/o abnormal lab values. On initial assessment patient diaphoretic and tech states CBG 62. Patient given orange juice and [place] sandwich with resolution of symptoms.   Patient urine with possibility of urinary tract infection with positive leukocytes, trace hemoglobin, a few bacteria and 11-20 white blood cells. CBC with significant leukocytosis of 26.4 and a mild left shift with metamyelocytes, myelocytes and bands.  CMP with acute renal failure and BUN of 41 creatinine of 2.37.  This is acute on chronic renal failure after renal transplant and concern exists for pyelonephritis and/or complications from her transplant.  Concern for transplant rejection.   Transplant was done in August 2013 at [facility] and patient prefers to be transferred back to [facility] for admission. Dr Odetta Pink is her nephrologist.  Pt will be transferred to [facility] for admission.    Dr. Ezequiel Essex was consulted, evaluated this patient with me and agrees with the plan.           Jarrett Soho Elley Harp, PA-C 11/01/12 [phone]

## 2012-11-01 NOTE — ED Notes (Signed)
Patient requested to take nighttime meds which include Prograf, Lyrica, Myfortic, Voriconazole and Caredilol; Dr. Wyvonnia Dusky gave permission

## 2012-11-01 NOTE — ED Notes (Signed)
Report given to [facility] [phone]) room 2101 at [facility]

## 2012-11-01 NOTE — ED Provider Notes (Signed)
Medical screening examination/treatment/procedure(s) were conducted as a shared visit with non-physician practitioner(s) and myself.  I personally evaluated the patient during the encounter  See my additional note  Ezequiel Essex, MD 11/01/12 [phone]

## 2012-11-02 LAB — URINE CULTURE: Colony Count: 5000

## 2012-11-11 ENCOUNTER — Encounter (HOSPITAL_COMMUNITY): Payer: Self-pay | Admitting: Nurse Practitioner

## 2012-11-11 ENCOUNTER — Emergency Department (HOSPITAL_COMMUNITY)
Admission: EM | Admit: 2012-11-11 | Discharge: 2012-11-11 | Disposition: A | Discharge status: Home / Self Care | Payer: 59 | Attending: Emergency Medicine | Admitting: Emergency Medicine

## 2012-11-11 ENCOUNTER — Emergency Department (HOSPITAL_COMMUNITY): Payer: 59

## 2012-11-11 DIAGNOSIS — Z79899 Other long term (current) drug therapy: Secondary | ICD-10-CM | POA: Insufficient documentation

## 2012-11-11 DIAGNOSIS — Z862 Personal history of diseases of the blood and blood-forming organs and certain disorders involving the immune mechanism: Secondary | ICD-10-CM | POA: Insufficient documentation

## 2012-11-11 DIAGNOSIS — M7989 Other specified soft tissue disorders: Secondary | ICD-10-CM | POA: Insufficient documentation

## 2012-11-11 DIAGNOSIS — Z8639 Personal history of other endocrine, nutritional and metabolic disease: Secondary | ICD-10-CM | POA: Insufficient documentation

## 2012-11-11 DIAGNOSIS — E1149 Type 2 diabetes mellitus with other diabetic neurological complication: Secondary | ICD-10-CM | POA: Insufficient documentation

## 2012-11-11 DIAGNOSIS — N184 Chronic kidney disease, stage 4 (severe): Secondary | ICD-10-CM | POA: Insufficient documentation

## 2012-11-11 DIAGNOSIS — E1142 Type 2 diabetes mellitus with diabetic polyneuropathy: Secondary | ICD-10-CM | POA: Insufficient documentation

## 2012-11-11 DIAGNOSIS — Z8679 Personal history of other diseases of the circulatory system: Secondary | ICD-10-CM | POA: Insufficient documentation

## 2012-11-11 DIAGNOSIS — I129 Hypertensive chronic kidney disease with stage 1 through stage 4 chronic kidney disease, or unspecified chronic kidney disease: Secondary | ICD-10-CM | POA: Insufficient documentation

## 2012-11-11 DIAGNOSIS — Z940 Kidney transplant status: Secondary | ICD-10-CM | POA: Insufficient documentation

## 2012-11-11 DIAGNOSIS — Z8739 Personal history of other diseases of the musculoskeletal system and connective tissue: Secondary | ICD-10-CM | POA: Insufficient documentation

## 2012-11-11 DIAGNOSIS — Z791 Long term (current) use of non-steroidal anti-inflammatories (NSAID): Secondary | ICD-10-CM | POA: Insufficient documentation

## 2012-11-11 DIAGNOSIS — E669 Obesity, unspecified: Secondary | ICD-10-CM | POA: Insufficient documentation

## 2012-11-11 DIAGNOSIS — M109 Gout, unspecified: Secondary | ICD-10-CM | POA: Insufficient documentation

## 2012-11-11 DIAGNOSIS — R0602 Shortness of breath: Secondary | ICD-10-CM | POA: Insufficient documentation

## 2012-11-11 DIAGNOSIS — Z7982 Long term (current) use of aspirin: Secondary | ICD-10-CM | POA: Insufficient documentation

## 2012-11-11 DIAGNOSIS — Z794 Long term (current) use of insulin: Secondary | ICD-10-CM | POA: Insufficient documentation

## 2012-11-11 DIAGNOSIS — R600 Localized edema: Secondary | ICD-10-CM

## 2012-11-11 DIAGNOSIS — E039 Hypothyroidism, unspecified: Secondary | ICD-10-CM | POA: Insufficient documentation

## 2012-11-11 DIAGNOSIS — Z8619 Personal history of other infectious and parasitic diseases: Secondary | ICD-10-CM | POA: Insufficient documentation

## 2012-11-11 LAB — CBC WITH DIFFERENTIAL/PLATELET
Basophils Absolute: 0 10*3/uL (ref 0.0–0.1)
Basophils Relative: 0 % (ref 0–1)
Eosinophils Absolute: 0.1 10*3/uL (ref 0.0–0.7)
Eosinophils Relative: 1 % (ref 0–5)
HCT: 29.3 % — ABNORMAL LOW (ref 36.0–46.0)
Hemoglobin: 9.4 g/dL — ABNORMAL LOW (ref 12.0–15.0)
Lymphocytes Relative: 10 % — ABNORMAL LOW (ref 12–46)
Lymphs Abs: 1 10*3/uL (ref 0.7–4.0)
MCH: 26.5 pg (ref 26.0–34.0)
MCHC: 32.1 g/dL (ref 30.0–36.0)
MCV: 82.5 fL (ref 78.0–100.0)
Monocytes Absolute: 0.3 10*3/uL (ref 0.1–1.0)
Monocytes Relative: 3 % (ref 3–12)
Neutro Abs: 8.6 10*3/uL — ABNORMAL HIGH (ref 1.7–7.7)
Neutrophils Relative %: 86 % — ABNORMAL HIGH (ref 43–77)
Platelets: 251 10*3/uL (ref 150–400)
RBC: 3.55 MIL/uL — ABNORMAL LOW (ref 3.87–5.11)
RDW: 16.1 % — ABNORMAL HIGH (ref 11.5–15.5)
WBC: 10 10*3/uL (ref 4.0–10.5)

## 2012-11-11 LAB — BASIC METABOLIC PANEL
BUN: 43 mg/dL — ABNORMAL HIGH (ref 6–23)
CO2: 15 mEq/L — ABNORMAL LOW (ref 19–32)
Calcium: 8.9 mg/dL (ref 8.4–10.5)
Chloride: 102 mEq/L (ref 96–112)
Creatinine, Ser: 2.12 mg/dL — ABNORMAL HIGH (ref 0.50–1.10)
GFR calc Af Amer: 30 mL/min — ABNORMAL LOW (ref >90)
GFR calc non Af Amer: 26 mL/min — ABNORMAL LOW (ref >90)
Glucose, Bld: 660 mg/dL (ref 70–99)
Potassium: 5 mEq/L (ref 3.5–5.1)
Sodium: 131 mEq/L — ABNORMAL LOW (ref 135–145)

## 2012-11-11 LAB — PRO B NATRIURETIC PEPTIDE: Pro B Natriuretic peptide (BNP): 659.9 pg/mL — ABNORMAL HIGH (ref 0–125)

## 2012-11-11 LAB — POCT I-STAT TROPONIN I: Troponin i, poc: 0 ng/mL (ref 0.00–0.08)

## 2012-11-11 LAB — GLUCOSE, CAPILLARY: Glucose-Capillary: 171 mg/dL — ABNORMAL HIGH (ref 70–99)

## 2012-11-11 MED ORDER — FENTANYL CITRATE 0.05 MG/ML IJ SOLN
25.0000 ug | Freq: Once | INTRAMUSCULAR | Status: AC
  Administered 2012-11-11: 25 ug via INTRAVENOUS
  Filled 2012-11-11: qty 2

## 2012-11-11 MED ORDER — INSULIN ASPART 100 UNIT/ML ~~LOC~~ SOLN
10.0000 [IU] | Freq: Once | SUBCUTANEOUS | Status: AC
  Administered 2012-11-11: 10 [IU] via INTRAVENOUS
  Filled 2012-11-11: qty 1

## 2012-11-11 NOTE — ED Notes (Signed)
Pt reports she had a kidney transplant in aug 2013, was taken off fluid pill at that time. Reports since august she has been gaining weight and noticed swelling all over her body and over past few days has become very SOB. Pt with mild labored breathing on exertion. States she "Feels weak."

## 2012-11-11 NOTE — ED Notes (Signed)
Pt c/o bilateral ankle pain, pt reports she has been taking tramadol at home for the pain but it kept giving her a headache so she stopped taking it.

## 2012-11-11 NOTE — ED Notes (Signed)
Spoke with lab, will result BNP in about 10 minutes.

## 2012-11-11 NOTE — ED Notes (Addendum)
Pt c/o bilateral lower leg and feet swelling. Pt reports she can't feel any sensation on big toes. Pt c/o SOB with exertion. Pt denies CP. Pt sts she hasn't been taking her lasix because her doctor took her off of it 2 weeks ago because her creatine was too high. Pt has recent kidney transplant in august 2013. Pt reports she was called for physician at [facility] and they told her her Cre level was back down and she could resume taking the lasix. Pt reports she is out of her lasix and didn't have a refill on her prescription.

## 2012-11-11 NOTE — ED Provider Notes (Signed)
History     CSN: [id number]  Arrival date & time 11/11/12  1429   First MD Initiated Contact with Patient 11/11/12 1624      Chief Complaint  Patient presents with  . Leg Swelling    (Consider location/radiation/quality/duration/timing/severity/associated sxs/prior treatment) HPI Comments: Pt presents today for bilateral LE edema.  Fluid has been collecting in LE over the past few months but only recently she started experiencing SOB with exertion.  Symptoms also made worse with lying supine and relieved with rest and upright positioning.  Reports both hands and face have also started to swell.  Had a kidney transplant in august 2013 and was taken off of her daily lasix due to rising creatinine.  Is currently being treated with abx for UTI.  Denies any chest pain, nausea, vomiting, fever, or abdominal pain.  The history is provided by the patient.    Past Medical History  Diagnosis Date  . Hyperlipidemia   . Unspecified essential hypertension   . Shingles   . Obesity   . Hypothyroidism   . Diabetes mellitus type II   . Nonspecific abnormal unspecified cardiovascular function study   . Foot pain   . Renal transplant disorder   . Migraines   . Peripheral vascular disease   . PAD (peripheral artery disease)   . Diabetic peripheral neuropathy   . Dyspnea on exertion   . Anemia   . Chronic kidney disease, stage IV (severe)     "never went on dialysis" (09/04/2012)  . Arthritis     "hands" (09/04/2012)  . Gout     Past Surgical History  Procedure Laterality Date  . Av fistula placement, radiocephalic  [id number]    "right upper arm" (09/04/2012)  . Cesarean section  1986; 1987  . Endometrial cryoablation  1980's  . Colonoscopy    . Av fistula placement    . Transplantation renal  2013    "right" (09/04/2012)  . Cholecystectomy  2011  . Varicose vein surgery  1997    "RLE; stripped" (09/04/2012)  . Video bronchoscopy  09/07/2012    Procedure: VIDEO BRONCHOSCOPY WITHOUT FLUORO;   Surgeon: Chesley Mires, MD;  Location: [facility] ENDOSCOPY;  Service: Cardiopulmonary;  Laterality: Bilateral;    Family History  Problem Relation Age of Onset  . Colon cancer Father     deceased at age 34    History  Substance Use Topics  . Smoking status: Never Smoker   . Smokeless tobacco: Never Used  . Alcohol Use: No    OB History   Grav Para Term Preterm Abortions TAB SAB Ect Mult Living                  Review of Systems  Constitutional: Positive for unexpected weight change.  Respiratory: Positive for shortness of breath.   Cardiovascular: Positive for leg swelling.  All other systems reviewed and are negative.    Allergies  Hydrocodone-acetaminophen and Propoxyphene-acetaminophen  Home Medications   Current Outpatient Rx  Name  Route  Sig  Dispense  Refill  . allopurinol (ZYLOPRIM) 100 MG tablet   Oral   Take 100 mg by mouth daily.         [name] Kitchen amLODipine (NORVASC) 10 MG tablet   Oral   Take 5 mg by mouth 2 (two) times daily.          [name] Kitchen aspirin EC 81 MG tablet   Oral   Take 81 mg by mouth daily.         [name] Kitchen  carvedilol (COREG) 25 MG tablet   Oral   Take 37.5 mg by mouth 2 (two) times daily.          . ergocalciferol (VITAMIN D2) 50000 UNITS capsule   Oral   Take 50,000 Units by mouth every 30 (thirty) days.          . furosemide (LASIX) 80 MG tablet   Oral   Take 40 mg by mouth 2 (two) times daily.          . hydrALAZINE (APRESOLINE) 25 MG tablet   Oral   Take 37.5 mg by mouth 3 (three) times daily.         . insulin NPH (HUMULIN N,NOVOLIN N) 100 UNIT/ML injection   Subcutaneous   Inject 28 Units into the skin at bedtime.          . insulin regular (NOVOLIN R,HUMULIN R) 100 units/mL injection   Subcutaneous   Inject 24-28 Units into the skin 3 (three) times daily before meals. 28 units in the morning, 24 units at noon, and 24 units at 5pm.         . levothyroxine (SYNTHROID) 50 MCG tablet   Oral   Take 50 mcg by mouth daily.           . magnesium oxide (MAG-OX) 400 MG tablet   Oral   Take 400 mg by mouth 3 (three) times daily.         . mycophenolate (MYFORTIC) 360 MG TBEC   Oral   Take 2 tablets (720 mg total) by mouth 2 (two) times daily.         [name] Kitchen omeprazole (PRILOSEC) 20 MG capsule   Oral   Take 20 mg by mouth daily.         . potassium chloride (K-DUR) 10 MEQ tablet   Oral   Take 10 mEq by mouth 2 (two) times daily.         . predniSONE (DELTASONE) 1 MG tablet   Oral   Take 6 mg by mouth daily.          . pregabalin (LYRICA) 150 MG capsule   Oral   Take 150 mg by mouth 2 (two) times daily.         [name] Kitchen spironolactone (ALDACTONE) 50 MG tablet   Oral   Take 50 mg by mouth daily.          [name] Kitchen sulfamethoxazole-trimethoprim (BACTRIM DS) 800-160 MG per tablet   Oral   Take 1 tablet by mouth every Monday, Wednesday, and Friday.         . tacrolimus (PROGRAF) 1 MG capsule   Oral   Take 6 mg by mouth 2 (two) times daily.          . traMADol (ULTRAM) 50 MG tablet   Oral   Take 50 mg by mouth at bedtime. For pain         . valGANciclovir (VALCYTE) 450 MG tablet   Oral   Take 900 mg by mouth daily.         [name] Kitchen voriconazole (VFEND) 50 MG tablet   Oral   Take 50 mg by mouth 2 (two) times daily.           BP 175/70  Pulse 91  Temp(Src) 98.1 F (36.7 C) (Oral)  Resp 22  SpO2 100%  Physical Exam  Nursing note and vitals reviewed. Constitutional: She is oriented to person, place, and time. She appears well-developed and well-nourished. No  distress.  HENT:  Head: Normocephalic and atraumatic.  Mouth/Throat: Oropharynx is clear and moist and mucous membranes are normal.  Mild edema of face  Eyes: Conjunctivae and EOM are normal. Pupils are equal, round, and reactive to light.  Neck: Normal range of motion. Neck supple.  Cardiovascular: Normal rate, regular rhythm and normal heart sounds.   No murmur heard. Pulmonary/Chest: Effort normal and breath sounds normal. No  respiratory distress. She has no wheezes. She has no rhonchi.  Abdominal: Soft. Bowel sounds are normal. There is no tenderness.  Musculoskeletal: Normal range of motion. She exhibits edema (bilateral LE, 3+).  Mild edema of bilateral hands  Neurological: She is alert and oriented to person, place, and time. She has normal strength. A sensory deficit (decreased sensation to bilateral great toes- not new onset) is present. No cranial nerve deficit.  Skin: Skin is warm and dry.  Psychiatric: She has a normal mood and affect.    ED Course  Procedures (including critical care time)   Date: 11/11/2012  Rate: 90  Rhythm: normal sinus rhythm  QRS Axis: normal  Intervals: normal  ST/T Wave abnormalities: early repolarization  Conduction Disutrbances:none  Narrative Interpretation: NSR with mild LVH  Old EKG Reviewed: unchanged   Labs Reviewed  PRO B NATRIURETIC PEPTIDE - Abnormal; Notable for the following:    Pro B Natriuretic peptide (BNP) 659.9 (*)    All other components within normal limits  CBC WITH DIFFERENTIAL - Abnormal; Notable for the following:    RBC 3.55 (*)    Hemoglobin 9.4 (*)    HCT 29.3 (*)    RDW 16.1 (*)    Neutrophils Relative 86 (*)    Neutro Abs 8.6 (*)    Lymphocytes Relative 10 (*)    All other components within normal limits  BASIC METABOLIC PANEL - Abnormal; Notable for the following:    Sodium 131 (*)    CO2 15 (*)    Glucose, Bld 660 (*)    BUN 43 (*)    Creatinine, Ser 2.12 (*)    GFR calc non Af Amer 26 (*)    GFR calc Af Amer 30 (*)    All other components within normal limits  GLUCOSE, CAPILLARY - Abnormal; Notable for the following:    Glucose-Capillary 171 (*)    All other components within normal limits  POCT I-STAT TROPONIN I   Dg Chest 2 View  11/11/2012  *RADIOLOGY REPORT*  Clinical Data: Short of breath, leg edema  CHEST - 2 VIEW  Comparison: none  Findings: Normal mediastinum and cardiac silhouette.  Normal pulmonary  vasculature.   No evidence of effusion, infiltrate, or pneumothorax.  No acute bony abnormality.  IMPRESSION: No acute cardiopulmonary process.   Original Report Authenticated By: Suzy Bouchard, M.D.      1. Bilateral lower extremity edema   2. Shortness of breath       MDM  4:42 PM Pt evaluated.  CXR without acute cardiopulmonary changes.  Lungs CTA.  Glucose 660- pt admits to eating birthday cake.  Will give 10 units regular insulin IV.  8:52 PM Dr. Maryan Rued evaluated.  BS stable.  Pt is stable and ok for discharge.  Instructed to schedule follow-up with her physician asap for further evaluation.  Return to ED for new or worsening symptoms.    Larene Pickett, PA-C 11/11/12 2212

## 2012-11-12 NOTE — ED Provider Notes (Signed)
Medical screening examination/treatment/procedure(s) were conducted as a shared visit with non-physician practitioner(s) and myself.  I personally evaluated the patient during the encounter Patient with a history of renal transplant approximately one year ago who is currently having worsening shortness of breath with exertion. She denies any chest pain or resting shortness of breath. No paroxysmal nocturnal dyspnea or orthopnea. She does have distal edema. She recently states she had to go off her Lasix due to elevation in her creatinine.  Today creatinine is stable at 2.12. BNP is only 600 and chest x-ray is clear. She has normal vital signs and she is in no acute distress. Discussing with the patient she will call her doctor in the morning to see if they want to restart her Lasix.  Blanchie Dessert, MD 11/12/12 [phone]

## 2013-01-07 ENCOUNTER — Ambulatory Visit (INDEPENDENT_AMBULATORY_CARE_PROVIDER_SITE_OTHER): Payer: Self-pay | Admitting: Ophthalmology

## 2013-01-08 ENCOUNTER — Inpatient Hospital Stay (HOSPITAL_COMMUNITY)
Admission: EM | Admit: 2013-01-08 | Discharge: 2013-01-11 | DRG: 699 | Disposition: A | Discharge status: Other Acute Inpatient Hospital | Payer: 59 | Attending: Internal Medicine | Admitting: Internal Medicine

## 2013-01-08 ENCOUNTER — Encounter (HOSPITAL_COMMUNITY): Payer: Self-pay | Admitting: *Deleted

## 2013-01-08 ENCOUNTER — Emergency Department (HOSPITAL_COMMUNITY): Payer: 59

## 2013-01-08 ENCOUNTER — Inpatient Hospital Stay (HOSPITAL_COMMUNITY): Payer: 59

## 2013-01-08 DIAGNOSIS — D72819 Decreased white blood cell count, unspecified: Secondary | ICD-10-CM | POA: Diagnosis not present

## 2013-01-08 DIAGNOSIS — E039 Hypothyroidism, unspecified: Secondary | ICD-10-CM

## 2013-01-08 DIAGNOSIS — Z6838 Body mass index (BMI) 38.0-38.9, adult: Secondary | ICD-10-CM

## 2013-01-08 DIAGNOSIS — R609 Edema, unspecified: Secondary | ICD-10-CM | POA: Diagnosis present

## 2013-01-08 DIAGNOSIS — R0989 Other specified symptoms and signs involving the circulatory and respiratory systems: Secondary | ICD-10-CM

## 2013-01-08 DIAGNOSIS — E669 Obesity, unspecified: Secondary | ICD-10-CM

## 2013-01-08 DIAGNOSIS — Z9089 Acquired absence of other organs: Secondary | ICD-10-CM

## 2013-01-08 DIAGNOSIS — E119 Type 2 diabetes mellitus without complications: Secondary | ICD-10-CM

## 2013-01-08 DIAGNOSIS — I129 Hypertensive chronic kidney disease with stage 1 through stage 4 chronic kidney disease, or unspecified chronic kidney disease: Secondary | ICD-10-CM | POA: Diagnosis present

## 2013-01-08 DIAGNOSIS — A059 Bacterial foodborne intoxication, unspecified: Secondary | ICD-10-CM | POA: Diagnosis present

## 2013-01-08 DIAGNOSIS — T8610 Unspecified complication of kidney transplant: Principal | ICD-10-CM

## 2013-01-08 DIAGNOSIS — R112 Nausea with vomiting, unspecified: Secondary | ICD-10-CM

## 2013-01-08 DIAGNOSIS — Z79899 Other long term (current) drug therapy: Secondary | ICD-10-CM

## 2013-01-08 DIAGNOSIS — E785 Hyperlipidemia, unspecified: Secondary | ICD-10-CM

## 2013-01-08 DIAGNOSIS — I10 Essential (primary) hypertension: Secondary | ICD-10-CM

## 2013-01-08 DIAGNOSIS — Y830 Surgical operation with transplant of whole organ as the cause of abnormal reaction of the patient, or of later complication, without mention of misadventure at the time of the procedure: Secondary | ICD-10-CM | POA: Diagnosis present

## 2013-01-08 DIAGNOSIS — M109 Gout, unspecified: Secondary | ICD-10-CM | POA: Diagnosis present

## 2013-01-08 DIAGNOSIS — Z794 Long term (current) use of insulin: Secondary | ICD-10-CM

## 2013-01-08 DIAGNOSIS — E1142 Type 2 diabetes mellitus with diabetic polyneuropathy: Secondary | ICD-10-CM | POA: Diagnosis present

## 2013-01-08 DIAGNOSIS — I739 Peripheral vascular disease, unspecified: Secondary | ICD-10-CM | POA: Diagnosis present

## 2013-01-08 DIAGNOSIS — R05 Cough: Secondary | ICD-10-CM

## 2013-01-08 DIAGNOSIS — E871 Hypo-osmolality and hyponatremia: Secondary | ICD-10-CM

## 2013-01-08 DIAGNOSIS — Z885 Allergy status to narcotic agent status: Secondary | ICD-10-CM

## 2013-01-08 DIAGNOSIS — R509 Fever, unspecified: Secondary | ICD-10-CM

## 2013-01-08 DIAGNOSIS — E1149 Type 2 diabetes mellitus with other diabetic neurological complication: Secondary | ICD-10-CM | POA: Diagnosis present

## 2013-01-08 DIAGNOSIS — K5289 Other specified noninfective gastroenteritis and colitis: Secondary | ICD-10-CM | POA: Diagnosis present

## 2013-01-08 DIAGNOSIS — D649 Anemia, unspecified: Secondary | ICD-10-CM

## 2013-01-08 DIAGNOSIS — N184 Chronic kidney disease, stage 4 (severe): Secondary | ICD-10-CM

## 2013-01-08 DIAGNOSIS — R000 Tachycardia, unspecified: Secondary | ICD-10-CM | POA: Diagnosis present

## 2013-01-08 DIAGNOSIS — R0609 Other forms of dyspnea: Secondary | ICD-10-CM

## 2013-01-08 DIAGNOSIS — R9430 Abnormal result of cardiovascular function study, unspecified: Secondary | ICD-10-CM

## 2013-01-08 DIAGNOSIS — M19049 Primary osteoarthritis, unspecified hand: Secondary | ICD-10-CM | POA: Diagnosis present

## 2013-01-08 DIAGNOSIS — IMO0002 Reserved for concepts with insufficient information to code with codable children: Secondary | ICD-10-CM

## 2013-01-08 DIAGNOSIS — B029 Zoster without complications: Secondary | ICD-10-CM

## 2013-01-08 DIAGNOSIS — Z7982 Long term (current) use of aspirin: Secondary | ICD-10-CM

## 2013-01-08 DIAGNOSIS — E1129 Type 2 diabetes mellitus with other diabetic kidney complication: Secondary | ICD-10-CM | POA: Diagnosis present

## 2013-01-08 DIAGNOSIS — I319 Disease of pericardium, unspecified: Secondary | ICD-10-CM | POA: Diagnosis present

## 2013-01-08 DIAGNOSIS — Z800 Family history of malignant neoplasm of digestive organs: Secondary | ICD-10-CM

## 2013-01-08 DIAGNOSIS — N189 Chronic kidney disease, unspecified: Secondary | ICD-10-CM

## 2013-01-08 DIAGNOSIS — N179 Acute kidney failure, unspecified: Secondary | ICD-10-CM

## 2013-01-08 DIAGNOSIS — N390 Urinary tract infection, site not specified: Secondary | ICD-10-CM | POA: Diagnosis present

## 2013-01-08 DIAGNOSIS — Z87898 Personal history of other specified conditions: Secondary | ICD-10-CM

## 2013-01-08 DIAGNOSIS — R059 Cough, unspecified: Secondary | ICD-10-CM

## 2013-01-08 DIAGNOSIS — R0602 Shortness of breath: Secondary | ICD-10-CM

## 2013-01-08 DIAGNOSIS — J189 Pneumonia, unspecified organism: Secondary | ICD-10-CM

## 2013-01-08 LAB — CBC WITH DIFFERENTIAL/PLATELET
Basophils Absolute: 0.1 10*3/uL (ref 0.0–0.1)
Basophils Relative: 1 % (ref 0–1)
Eosinophils Absolute: 0.1 10*3/uL (ref 0.0–0.7)
Eosinophils Relative: 1 % (ref 0–5)
HCT: 29.3 % — ABNORMAL LOW (ref 36.0–46.0)
Hemoglobin: 9.5 g/dL — ABNORMAL LOW (ref 12.0–15.0)
Lymphocytes Relative: 31 % (ref 12–46)
Lymphs Abs: 2.5 10*3/uL (ref 0.7–4.0)
MCH: 24.5 pg — ABNORMAL LOW (ref 26.0–34.0)
MCHC: 32.4 g/dL (ref 30.0–36.0)
MCV: 75.5 fL — ABNORMAL LOW (ref 78.0–100.0)
Monocytes Absolute: 2.3 10*3/uL — ABNORMAL HIGH (ref 0.1–1.0)
Monocytes Relative: 29 % — ABNORMAL HIGH (ref 3–12)
Neutro Abs: 3 10*3/uL (ref 1.7–7.7)
Neutrophils Relative %: 38 % — ABNORMAL LOW (ref 43–77)
Platelets: 227 10*3/uL (ref 150–400)
RBC: 3.88 MIL/uL (ref 3.87–5.11)
RDW: 16.1 % — ABNORMAL HIGH (ref 11.5–15.5)
WBC: 8 10*3/uL (ref 4.0–10.5)

## 2013-01-08 LAB — URINALYSIS, MICROSCOPIC ONLY
Glucose, UA: 250 mg/dL — AB
Hgb urine dipstick: NEGATIVE
Ketones, ur: NEGATIVE mg/dL
Nitrite: NEGATIVE
Protein, ur: >300 mg/dL — AB
Specific Gravity, Urine: 1.019 (ref 1.005–1.030)
Urobilinogen, UA: 1 mg/dL (ref 0.0–1.0)
pH: 7.5 (ref 5.0–8.0)

## 2013-01-08 LAB — GLUCOSE, CAPILLARY: Glucose-Capillary: 339 mg/dL — ABNORMAL HIGH (ref 70–99)

## 2013-01-08 LAB — LIPASE, BLOOD: Lipase: 11 U/L (ref 11–59)

## 2013-01-08 LAB — CG4 I-STAT (LACTIC ACID): Lactic Acid, Venous: 2.83 mmol/L — ABNORMAL HIGH (ref 0.5–2.2)

## 2013-01-08 LAB — COMPREHENSIVE METABOLIC PANEL
ALT: 16 U/L (ref 0–35)
AST: 23 U/L (ref 0–37)
Albumin: 2.9 g/dL — ABNORMAL LOW (ref 3.5–5.2)
Alkaline Phosphatase: 195 U/L — ABNORMAL HIGH (ref 39–117)
BUN: 33 mg/dL — ABNORMAL HIGH (ref 6–23)
CO2: 24 mEq/L (ref 19–32)
Calcium: 8.6 mg/dL (ref 8.4–10.5)
Chloride: 98 mEq/L (ref 96–112)
Creatinine, Ser: 2.57 mg/dL — ABNORMAL HIGH (ref 0.50–1.10)
GFR calc Af Amer: 24 mL/min — ABNORMAL LOW (ref >90)
GFR calc non Af Amer: 20 mL/min — ABNORMAL LOW (ref >90)
Glucose, Bld: 324 mg/dL — ABNORMAL HIGH (ref 70–99)
Potassium: 4.3 mEq/L (ref 3.5–5.1)
Sodium: 133 mEq/L — ABNORMAL LOW (ref 135–145)
Total Bilirubin: 0.6 mg/dL (ref 0.3–1.2)
Total Protein: 7.3 g/dL (ref 6.0–8.3)

## 2013-01-08 MED ORDER — ACETAMINOPHEN 325 MG PO TABS
650.0000 mg | ORAL_TABLET | Freq: Once | ORAL | Status: AC
  Administered 2013-01-08: 650 mg via ORAL
  Filled 2013-01-08: qty 2

## 2013-01-08 MED ORDER — PREGABALIN 50 MG PO CAPS
75.0000 mg | ORAL_CAPSULE | Freq: Two times a day (BID) | ORAL | Status: DC
  Administered 2013-01-09 – 2013-01-11 (×5): 75 mg via ORAL
  Filled 2013-01-08 (×5): qty 1
  Filled 2013-01-08: qty 3

## 2013-01-08 MED ORDER — MYCOPHENOLATE SODIUM 180 MG PO TBEC
720.0000 mg | DELAYED_RELEASE_TABLET | Freq: Two times a day (BID) | ORAL | Status: DC
  Administered 2013-01-09 – 2013-01-11 (×6): 720 mg via ORAL
  Filled 2013-01-08 (×7): qty 4

## 2013-01-08 MED ORDER — VALGANCICLOVIR HCL 450 MG PO TABS
900.0000 mg | ORAL_TABLET | Freq: Every day | ORAL | Status: DC
  Administered 2013-01-09: 900 mg via ORAL
  Filled 2013-01-08 (×2): qty 2

## 2013-01-08 MED ORDER — CARVEDILOL 25 MG PO TABS
37.5000 mg | ORAL_TABLET | Freq: Two times a day (BID) | ORAL | Status: DC
  Administered 2013-01-09 – 2013-01-11 (×6): 37.5 mg via ORAL
  Filled 2013-01-08 (×7): qty 1

## 2013-01-08 MED ORDER — MAGNESIUM OXIDE 400 MG PO TABS: 400.0000 mg | ORAL_TABLET | Freq: Three times a day (TID) | ORAL | Status: DC

## 2013-01-08 MED ORDER — TACROLIMUS 1 MG PO CAPS
1.0000 mg | ORAL_CAPSULE | Freq: Two times a day (BID) | ORAL | Status: DC
  Administered 2013-01-09 – 2013-01-11 (×6): 1 mg via ORAL
  Filled 2013-01-08 (×7): qty 1

## 2013-01-08 MED ORDER — ASPIRIN EC 81 MG PO TBEC
81.0000 mg | DELAYED_RELEASE_TABLET | Freq: Every day | ORAL | Status: DC
  Administered 2013-01-09 – 2013-01-11 (×3): 81 mg via ORAL
  Filled 2013-01-08 (×3): qty 1

## 2013-01-08 MED ORDER — HYDRALAZINE HCL 25 MG PO TABS
37.5000 mg | ORAL_TABLET | Freq: Three times a day (TID) | ORAL | Status: DC
  Administered 2013-01-09 (×4): 37.5 mg via ORAL
  Filled 2013-01-08 (×7): qty 1.5

## 2013-01-08 MED ORDER — ONDANSETRON HCL 4 MG/2ML IJ SOLN
4.0000 mg | Freq: Once | INTRAMUSCULAR | Status: AC
  Administered 2013-01-08: 4 mg via INTRAVENOUS
  Filled 2013-01-08: qty 2

## 2013-01-08 MED ORDER — ACETAMINOPHEN 650 MG RE SUPP: 650.0000 mg | Freq: Four times a day (QID) | RECTAL | Status: DC | PRN

## 2013-01-08 MED ORDER — ONDANSETRON HCL 4 MG/2ML IJ SOLN
4.0000 mg | Freq: Four times a day (QID) | INTRAMUSCULAR | Status: DC | PRN
  Administered 2013-01-09: 4 mg via INTRAVENOUS
  Filled 2013-01-08: qty 2

## 2013-01-08 MED ORDER — VANCOMYCIN HCL 10 G IV SOLR
1500.0000 mg | INTRAVENOUS | Status: AC
  Administered 2013-01-09: 1500 mg via INTRAVENOUS
  Filled 2013-01-08: qty 1500

## 2013-01-08 MED ORDER — SULFAMETHOXAZOLE-TMP DS 800-160 MG PO TABS
1.0000 | ORAL_TABLET | ORAL | Status: DC
  Administered 2013-01-09 – 2013-01-11 (×2): 1 via ORAL
  Filled 2013-01-08 (×2): qty 1

## 2013-01-08 MED ORDER — PIPERACILLIN-TAZOBACTAM 3.375 G IVPB
3.3750 g | Freq: Three times a day (TID) | INTRAVENOUS | Status: DC
  Administered 2013-01-09 – 2013-01-10 (×3): 3.375 g via INTRAVENOUS
  Filled 2013-01-08 (×6): qty 50

## 2013-01-08 MED ORDER — ACETAMINOPHEN 325 MG PO TABS: 650.0000 mg | ORAL_TABLET | Freq: Four times a day (QID) | ORAL | Status: DC | PRN

## 2013-01-08 MED ORDER — ERGOCALCIFEROL 1.25 MG (50000 UT) PO CAPS: 50000.0000 [IU] | ORAL_CAPSULE | ORAL | Status: DC

## 2013-01-08 MED ORDER — MAGNESIUM OXIDE 400 (241.3 MG) MG PO TABS
400.0000 mg | ORAL_TABLET | Freq: Three times a day (TID) | ORAL | Status: DC
  Administered 2013-01-09 – 2013-01-11 (×8): 400 mg via ORAL
  Filled 2013-01-08 (×9): qty 1

## 2013-01-08 MED ORDER — SODIUM CHLORIDE 0.9 % IJ SOLN
3.0000 mL | Freq: Two times a day (BID) | INTRAMUSCULAR | Status: DC
  Administered 2013-01-10 – 2013-01-11 (×2): 3 mL via INTRAVENOUS

## 2013-01-08 MED ORDER — ALLOPURINOL 100 MG PO TABS
100.0000 mg | ORAL_TABLET | Freq: Every day | ORAL | Status: DC
  Administered 2013-01-09 – 2013-01-11 (×3): 100 mg via ORAL
  Filled 2013-01-08 (×3): qty 1

## 2013-01-08 MED ORDER — SODIUM CHLORIDE 0.9 % IV BOLUS (SEPSIS)
1000.0000 mL | Freq: Once | INTRAVENOUS | Status: AC
  Administered 2013-01-08: 1000 mL via INTRAVENOUS

## 2013-01-08 MED ORDER — INSULIN ASPART 100 UNIT/ML ~~LOC~~ SOLN
0.0000 [IU] | Freq: Three times a day (TID) | SUBCUTANEOUS | Status: DC
  Administered 2013-01-09: 7 [IU] via SUBCUTANEOUS
  Administered 2013-01-09: 9 [IU] via SUBCUTANEOUS
  Administered 2013-01-10 (×2): 7 [IU] via SUBCUTANEOUS
  Administered 2013-01-10 – 2013-01-11 (×2): 9 [IU] via SUBCUTANEOUS

## 2013-01-08 MED ORDER — PANTOPRAZOLE SODIUM 40 MG PO TBEC
40.0000 mg | DELAYED_RELEASE_TABLET | Freq: Every day | ORAL | Status: DC
  Administered 2013-01-09 – 2013-01-11 (×3): 40 mg via ORAL
  Filled 2013-01-08 (×3): qty 1

## 2013-01-08 MED ORDER — SODIUM CHLORIDE 0.9 % IV SOLN
2500.0000 mg | Freq: Once | INTRAVENOUS | Status: AC
  Administered 2013-01-08: 2500 mg via INTRAVENOUS
  Filled 2013-01-08: qty 2500

## 2013-01-08 MED ORDER — ONDANSETRON HCL 4 MG PO TABS: 4.0000 mg | ORAL_TABLET | Freq: Four times a day (QID) | ORAL | Status: DC | PRN

## 2013-01-08 MED ORDER — AMLODIPINE BESYLATE 5 MG PO TABS
5.0000 mg | ORAL_TABLET | Freq: Two times a day (BID) | ORAL | Status: DC
  Administered 2013-01-09 – 2013-01-10 (×4): 5 mg via ORAL
  Filled 2013-01-08 (×5): qty 1

## 2013-01-08 MED ORDER — INSULIN NPH (HUMAN) (ISOPHANE) 100 UNIT/ML ~~LOC~~ SUSP
28.0000 [IU] | Freq: Every day | SUBCUTANEOUS | Status: DC
  Administered 2013-01-09: 28 [IU] via SUBCUTANEOUS
  Filled 2013-01-08: qty 10

## 2013-01-08 MED ORDER — SODIUM CHLORIDE 0.9 % IV SOLN
INTRAVENOUS | Status: AC
  Administered 2013-01-08: 22:00:00 via INTRAVENOUS

## 2013-01-08 MED ORDER — PREDNISONE 5 MG PO TABS
6.0000 mg | ORAL_TABLET | Freq: Every day | ORAL | Status: DC
  Administered 2013-01-09 – 2013-01-10 (×2): 6 mg via ORAL
  Filled 2013-01-08 (×3): qty 1

## 2013-01-08 MED ORDER — VITAMIN D (ERGOCALCIFEROL) 1.25 MG (50000 UNIT) PO CAPS: 50000.0000 [IU] | ORAL_CAPSULE | ORAL | Status: DC

## 2013-01-08 MED ORDER — LEVOTHYROXINE SODIUM 50 MCG PO TABS
50.0000 ug | ORAL_TABLET | Freq: Every day | ORAL | Status: DC
  Administered 2013-01-09 – 2013-01-11 (×3): 50 ug via ORAL
  Filled 2013-01-08 (×4): qty 1

## 2013-01-08 MED ORDER — HEPARIN SODIUM (PORCINE) 5000 UNIT/ML IJ SOLN
5000.0000 [IU] | Freq: Three times a day (TID) | INTRAMUSCULAR | Status: DC
Start: 2013-01-09 — End: 2013-01-11
  Administered 2013-01-09 – 2013-01-11 (×8): 5000 [IU] via SUBCUTANEOUS
  Filled 2013-01-08 (×10): qty 1

## 2013-01-08 NOTE — ED Notes (Signed)
Admitting at bedside 

## 2013-01-08 NOTE — ED Notes (Signed)
Pt transported to CT ?

## 2013-01-08 NOTE — Progress Notes (Signed)
Patient admitted to unit, room 6734. Patient alert and oriented. Patient denies any pain but does complain of nausea. Patient placed on telemetry, box 6735. Patient oriented to unit. Bed in lowest position. Call bell within reach. Will continue to monitor.

## 2013-01-08 NOTE — ED Provider Notes (Signed)
Medical screening examination/treatment/procedure(s) were performed by non-physician practitioner and as supervising physician I was immediately available for consultation/collaboration.  Hoy Morn, MD 01/08/13 [phone]

## 2013-01-08 NOTE — ED Provider Notes (Signed)
History     CSN: [id number]  Arrival date & time 01/08/13  53   First MD Initiated Contact with Patient 01/08/13 1639      Chief Complaint  Patient presents with  . Nausea    (Consider location/radiation/quality/duration/timing/severity/associated sxs/prior treatment) HPI Comments: Patient with a history of Kidney Transplant in August 2013 presents with a chief complaint of nausea and vomiting.  She reports that her symptoms started yesterday.  She has had three episodes of vomiting yesterday and today.  She denies diarrhea.  Denies abdominal pain.  She denies urinary symptoms.  Denies chest pain or cough.  She was not aware of a fever, but reports that she has not checked her temperature.  Temperature on arrival was 102.6 F orally.  She has not taken anything for her symptoms prior to arrival.  Renal transplant was done at [facility] in August.  She is currently taking her medications as prescribed.  The history is provided by the patient.    Past Medical History  Diagnosis Date  . Hyperlipidemia   . Unspecified essential hypertension   . Shingles   . Obesity   . Hypothyroidism   . Diabetes mellitus type II   . Nonspecific abnormal unspecified cardiovascular function study   . Foot pain   . Renal transplant disorder   . Migraines   . Peripheral vascular disease   . PAD (peripheral artery disease)   . Diabetic peripheral neuropathy   . Dyspnea on exertion   . Anemia   . Chronic kidney disease, stage IV (severe)     "never went on dialysis" (09/04/2012)  . Arthritis     "hands" (09/04/2012)  . Gout     Past Surgical History  Procedure Laterality Date  . Av fistula placement, radiocephalic  [id number]    "right upper arm" (09/04/2012)  . Cesarean section  1986; 1987  . Endometrial cryoablation  1980's  . Colonoscopy    . Av fistula placement    . Transplantation renal  2013    "right" (09/04/2012)  . Cholecystectomy  2011  . Varicose vein surgery  1997    "RLE; stripped"  (09/04/2012)  . Video bronchoscopy  09/07/2012    Procedure: VIDEO BRONCHOSCOPY WITHOUT FLUORO;  Surgeon: Chesley Mires, MD;  Location: [facility] ENDOSCOPY;  Service: Cardiopulmonary;  Laterality: Bilateral;    Family History  Problem Relation Age of Onset  . Colon cancer Father     deceased at age 33    History  Substance Use Topics  . Smoking status: Never Smoker   . Smokeless tobacco: Never Used  . Alcohol Use: No    OB History   Grav Para Term Preterm Abortions TAB SAB Ect Mult Living                  Review of Systems  Constitutional: Positive for fever and chills.  Respiratory: Negative for cough and shortness of breath.   Cardiovascular: Negative for chest pain.  Gastrointestinal: Positive for nausea and vomiting. Negative for abdominal pain, diarrhea and constipation.  Genitourinary: Negative for dysuria, urgency and frequency.  All other systems reviewed and are negative.    Allergies  Hydrocodone-acetaminophen and Propoxyphene-acetaminophen  Home Medications   Current Outpatient Rx  Name  Route  Sig  Dispense  Refill  . allopurinol (ZYLOPRIM) 100 MG tablet   Oral   Take 100 mg by mouth daily.         [name] Kitchen amLODipine (NORVASC) 10 MG tablet   Oral  Take 5 mg by mouth 2 (two) times daily.          [name] Kitchen aspirin EC 81 MG tablet   Oral   Take 81 mg by mouth daily.         . carvedilol (COREG) 25 MG tablet   Oral   Take 37.5 mg by mouth 2 (two) times daily.          . ergocalciferol (VITAMIN D2) 50000 UNITS capsule   Oral   Take 50,000 Units by mouth every 30 (thirty) days.          . hydrALAZINE (APRESOLINE) 25 MG tablet   Oral   Take 37.5 mg by mouth 3 (three) times daily.         . insulin NPH (HUMULIN N,NOVOLIN N) 100 UNIT/ML injection   Subcutaneous   Inject 28 Units into the skin at bedtime.          . insulin regular (NOVOLIN R,HUMULIN R) 100 units/mL injection   Subcutaneous   Inject 24-28 Units into the skin 3 (three) times daily before  meals. 28 units in the morning, 24 units at noon, and 24 units at 5pm.         . levothyroxine (SYNTHROID) 50 MCG tablet   Oral   Take 50 mcg by mouth daily.          . magnesium oxide (MAG-OX) 400 MG tablet   Oral   Take 400 mg by mouth 3 (three) times daily.         . mycophenolate (MYFORTIC) 360 MG TBEC   Oral   Take 2 tablets (720 mg total) by mouth 2 (two) times daily.         [name] Kitchen omeprazole (PRILOSEC) 20 MG capsule   Oral   Take 20 mg by mouth daily.         . predniSONE (DELTASONE) 1 MG tablet   Oral   Take 6 mg by mouth daily.          . pregabalin (LYRICA) 75 MG capsule   Oral   Take 75 mg by mouth 2 (two) times daily.         [name] Kitchen spironolactone (ALDACTONE) 50 MG tablet   Oral   Take 50 mg by mouth daily.          [name] Kitchen sulfamethoxazole-trimethoprim (BACTRIM DS) 800-160 MG per tablet   Oral   Take 1 tablet by mouth every Monday, Wednesday, and Friday.         . tacrolimus (PROGRAF) 1 MG capsule   Oral   Take 6 mg by mouth 2 (two) times daily.          . valGANciclovir (VALCYTE) 450 MG tablet   Oral   Take 900 mg by mouth daily.         [name] Kitchen voriconazole (VFEND) 50 MG tablet   Oral   Take 50 mg by mouth 2 (two) times daily.           BP 154/64  Pulse 117  Temp(Src) 102.6 F (39.2 C) (Oral)  Resp 16  SpO2 94%  Physical Exam  Nursing note and vitals reviewed. Constitutional: She appears well-developed and well-nourished. No distress.  HENT:  Head: Normocephalic and atraumatic.  Mouth/Throat: Mucous membranes are dry.  Neck: Normal range of motion. Neck supple.  Cardiovascular: Normal rate, regular rhythm and normal heart sounds.   Pulmonary/Chest: Effort normal and breath sounds normal. No respiratory distress. She has no wheezes. She has  no rales.  Abdominal: Soft. Bowel sounds are normal. She exhibits no distension and no mass. There is no tenderness. There is no rebound and no guarding.  Neurological: She is alert.  Skin: Skin is  warm and dry. She is not diaphoretic.  Psychiatric: She has a normal mood and affect.    ED Course  Procedures (including critical care time)  Labs Reviewed  COMPREHENSIVE METABOLIC PANEL - Abnormal; Notable for the following:    Sodium 133 (*)    Glucose, Bld 324 (*)    BUN 33 (*)    Creatinine, Ser 2.57 (*)    Albumin 2.9 (*)    Alkaline Phosphatase 195 (*)    GFR calc non Af Amer 20 (*)    GFR calc Af Amer 24 (*)    All other components within normal limits  CBC WITH DIFFERENTIAL - Abnormal; Notable for the following:    Hemoglobin 9.5 (*)    HCT 29.3 (*)    MCV 75.5 (*)    MCH 24.5 (*)    RDW 16.1 (*)    All other components within normal limits  CULTURE, BLOOD (ROUTINE X 2)  CULTURE, BLOOD (ROUTINE X 2)  LIPASE, BLOOD  URINALYSIS, MICROSCOPIC ONLY   Dg Chest 2 View  01/08/2013  *RADIOLOGY REPORT*  Clinical Data: Fever, nausea, vomiting and weakness.  CHEST - 2 VIEW  Comparison: 11/11/2012  Findings: There is some increased prominence of heart size without pulmonary edema or pleural effusions.  Bibasilar atelectasis present.  No focal consolidation is identified.  IMPRESSION: Increased prominence of heart size.  No pulmonary edema is identified.   Original Report Authenticated By: Aletta Edouard, M.D.      No diagnosis found.  6:30 PM Reassessed patient.  She is not having any abdominal pain at this time.  7:30 PM Reassessed patient.  She continues to deny abdominal pain.    8:00 PM Patient signed out to [facility], PA-C.  UA pending.  Plan is for the patient to be admitted once urine has resulted.    MDM  Patient with a history of renal transplant in August 2013 presents with nausea and vomiting.  Denies diarrhea or abdominal pain.  Patient found to be febrile and tachycardic upon arrival in the ED.  Due to her immunosuppression state, lactic acid and blood cultures ordered.  Lactic acid found to be mildly elevated to 2.8, but less than 4.0.  CXR negative.  UA  pending.   Blood cultures pending.  Patient will be admitted to Hospitalist service due to the fact that she is febrile and is immunocompromised due to kidney transplant.        Sherlyn Lees Soddy-Daisy, PA-C 01/09/13 1330

## 2013-01-08 NOTE — H&P (Signed)
[facility] History and Physical  Wanda [name] [id number] DOB: 05/23/1961 DOA: 01/08/2013  Referring physician: ER Physician. PCP: Myriam Jacobson, MD  Specialists: [facility].  Chief Complaint: Nausea vomiting.  HPI: Wanda [name] is a 52 y.o. female with history of renal transplant presented to the ER because of increasing nausea and vomiting over the last 3-4 days. Denies any abdominal pain or diarrhea. Patient had mild constipation had used laxatives after which she had bowel movement. Her nausea vomiting had progressively worsened to the point she was not able to keep in any food but was able to take medications. In the ER patient was found to be febrile with temperatures around 101F. Patient did not have any subjective feeling of fever chills. Patient's chest x-ray was unremarkable and urine is pending. Patient denies any chest pain shortness of breath productive cough dysuria. Patient states that one of her family members had nausea and vomiting last week.  Patient states that her Lasix dose was increased recently last 2 weeks ago because of increasing peripheral edema.  Review of Systems: As presented in the history of presenting illness, rest negative.  Past Medical History  Diagnosis Date  . Hyperlipidemia   . Unspecified essential hypertension   . Shingles   . Obesity   . Hypothyroidism   . Diabetes mellitus type II   . Nonspecific abnormal unspecified cardiovascular function study   . Foot pain   . Renal transplant disorder   . Migraines   . Peripheral vascular disease   . PAD (peripheral artery disease)   . Diabetic peripheral neuropathy   . Dyspnea on exertion   . Anemia   . Chronic kidney disease, stage IV (severe)     "never went on dialysis" (09/04/2012)  . Arthritis     "hands" (09/04/2012)  . Gout    Past Surgical History  Procedure Laterality Date  . Av fistula placement, radiocephalic  [id number]    "right upper arm"  (09/04/2012)  . Cesarean section  1986; 1987  . Endometrial cryoablation  1980's  . Colonoscopy    . Av fistula placement    . Transplantation renal  2013    "right" (09/04/2012)  . Cholecystectomy  2011  . Varicose vein surgery  1997    "RLE; stripped" (09/04/2012)  . Video bronchoscopy  09/07/2012    Procedure: VIDEO BRONCHOSCOPY WITHOUT FLUORO;  Surgeon: Chesley Mires, MD;  Location: [facility] ENDOSCOPY;  Service: Cardiopulmonary;  Laterality: Bilateral;   Social History:  reports that she has never smoked. She has never used smokeless tobacco. She reports that she does not drink alcohol or use illicit drugs.  lives at home. where does patient live--  can do ADLs. Can patient participate in ADLs?  Allergies  Allergen Reactions  . Hydrocodone-Acetaminophen Other (See Comments)    REACTION: headaches  . Propoxyphene-Acetaminophen Other (See Comments)    REACTION: headache    Family History  Problem Relation Age of Onset  . Colon cancer Father     deceased at age 73      Prior to Admission medications   Medication Sig Start Date End Date Taking? Authorizing Provider  allopurinol (ZYLOPRIM) 100 MG tablet Take 100 mg by mouth daily.   Yes Historical Provider, MD  amLODipine (NORVASC) 10 MG tablet Take 5 mg by mouth 2 (two) times daily.    Yes Historical Provider, MD  aspirin EC 81 MG tablet Take 81 mg by mouth daily.   Yes Historical Provider, MD  carvedilol (COREG) 25 MG tablet Take 37.5 mg by mouth 2 (two) times daily.    Yes Historical Provider, MD  ergocalciferol (VITAMIN D2) 50000 UNITS capsule Take 50,000 Units by mouth every 30 (thirty) days.    Yes Historical Provider, MD  furosemide (LASIX) 40 MG tablet Take 80 mg by mouth 2 (two) times daily.  01/04/13  Yes Historical Provider, MD  hydrALAZINE (APRESOLINE) 25 MG tablet Take 37.5 mg by mouth 3 (three) times daily.   Yes Historical Provider, MD  insulin NPH (HUMULIN N,NOVOLIN N) 100 UNIT/ML injection Inject 28 Units into the skin at  bedtime.    Yes Historical Provider, MD  insulin regular (NOVOLIN R,HUMULIN R) 100 units/mL injection Inject 24-28 Units into the skin 3 (three) times daily before meals. 28 units in the morning, 24 units at noon, and 24 units at 5pm.   Yes Historical Provider, MD  levothyroxine (SYNTHROID) 50 MCG tablet Take 50 mcg by mouth daily.    Yes Historical Provider, MD  magnesium oxide (MAG-OX) 400 MG tablet Take 400 mg by mouth 3 (three) times daily.   Yes Historical Provider, MD  mycophenolate (MYFORTIC) 360 MG TBEC Take 2 tablets (720 mg total) by mouth 2 (two) times daily. 09/06/12  Yes Sorin June Leap, MD  omeprazole (PRILOSEC) 20 MG capsule Take 20 mg by mouth daily.   Yes Historical Provider, MD  predniSONE (DELTASONE) 1 MG tablet Take 6 mg by mouth daily.    Yes Historical Provider, MD  pregabalin (LYRICA) 75 MG capsule Take 75 mg by mouth 2 (two) times daily. 11/03/12  Yes Historical Provider, MD  spironolactone (ALDACTONE) 50 MG tablet Take 50 mg by mouth daily.    Yes Historical Provider, MD  sulfamethoxazole-trimethoprim (BACTRIM DS) 800-160 MG per tablet Take 1 tablet by mouth every Monday, Wednesday, and Friday.   Yes Historical Provider, MD  tacrolimus (PROGRAF) 1 MG capsule Take 1 mg by mouth 2 (two) times daily.  12/18/12 12/18/13 Yes Historical Provider, MD  valGANciclovir (VALCYTE) 450 MG tablet Take 900 mg by mouth daily.   Yes Historical Provider, MD   Physical Exam: Filed Vitals:   01/08/13 2000 01/08/13 2045 01/08/13 2115 01/08/13 2142  BP: 153/62 146/54 137/61   Pulse: 111 108 107   Temp:      TempSrc:      Resp:      Height:    5' 10.08" (1.78 m)  Weight:    121.2 kg (267 lb 3.2 oz)  SpO2: 94% 93% 95%      General:   well-developed and nourished.  Eyes:  anicteric no pallor.  ENT:  no discharge from ears eyes nose or mouth.  Neck:  no JVD or mass.  Cardiovascular:  S1-S2 heard. Regular. Tachycardic.  Respiratory:  no rhonchi no crepitations.  Abdomen:  soft nontender  bowel sounds present.  Skin:  no rash.  Musculoskeletal:  edema both lower extremities.  Psychiatric:  appears normal.  Neurologic:  alert awake oriented to time place and person. Moves all extremities.  Labs on Admission:  Basic Metabolic Panel:  Recent Labs Lab 01/08/13 1645  NA 133*  K 4.3  CL 98  CO2 24  GLUCOSE 324*  BUN 33*  CREATININE 2.57*  CALCIUM 8.6   Liver Function Tests:  Recent Labs Lab 01/08/13 1645  AST 23  ALT 16  ALKPHOS 195*  BILITOT 0.6  PROT 7.3  ALBUMIN 2.9*    Recent Labs Lab 01/08/13 1645  LIPASE 11  No results found for this basename: AMMONIA,  in the last 168 hours CBC:  Recent Labs Lab 01/08/13 1645  WBC 8.0  NEUTROABS 3.0  HGB 9.5*  HCT 29.3*  MCV 75.5*  PLT 227   Cardiac Enzymes: No results found for this basename: CKTOTAL, CKMB, CKMBINDEX, TROPONINI,  in the last 168 hours  BNP (last 3 results)  Recent Labs  11/11/12 1500  PROBNP 659.9*   CBG: No results found for this basename: GLUCAP,  in the last 168 hours  Radiological Exams on Admission: Dg Chest 2 View  01/08/2013  *RADIOLOGY REPORT*  Clinical Data: Fever, nausea, vomiting and weakness.  CHEST - 2 VIEW  Comparison: 11/11/2012  Findings: There is some increased prominence of heart size without pulmonary edema or pleural effusions.  Bibasilar atelectasis present.  No focal consolidation is identified.  IMPRESSION: Increased prominence of heart size.  No pulmonary edema is identified.   Original Report Authenticated By: Aletta Edouard, M.D.     Assessment/Plan Principal Problem:   Nausea and vomiting Active Problems:   DIABETES MELLITUS, TYPE II   HYPERTENSION, UNSPECIFIED   Anemia   Fever   Renal failure (ARF), acute on chronic  1. Nausea and vomiting with fever - most likely of gastroenteritis. But given the patient's immunosuppressed state at this time blood cultures and urine cultures have been ordered and patient has been started on empiric  antibiotics. Since patient has been having progressively worsening nausea vomiting and increased lactic acid levels CT abdomen and pelvis without contrast has been ordered. Continue with gentle hydration. Patient did receive 1 L normal saline in the ER and I have ordered one more liter infusion over 10 hours. Caution as patient can easily get fluid overloaded. 2. Acute renal failure on chronic kidney disease in the transplanted kidney - follow urinalysis. For now I am holding off Lasix and spironolactone. Closely follow intake output and daily weight. Patient presently is on gentle hydration which needs to be readdressed in a.m. Patient states that she has not missed her immunosuppressants and this will be continued. 3. Diabetes mellitus type 2 uncontrolled - check hemoglobin A1c. Patient states she has not missed her medications and at this time continue home medications with sliding-scale coverage. Closely follow CBGs. 4. Hypertension - except for the diuretics continue home medications. 5. Hypothyroidism - continue Synthroid. Check TSH.   Patient's Lasix and spironolactone is on hold. Closely follow patient's fluid status. Recheck lactic acid in a.m.  Code Status:  full code.  Family Communication:  none.  Disposition Plan:  admit to inpatient.    Pericles Carmicheal N. [facility] Pager [phone].  If 7PM-7AM, please contact night-coverage [email or web address] Password [facility] 01/08/2013, 9:44 PM

## 2013-01-08 NOTE — ED Notes (Signed)
Pt, hx of R kidney transplant in Aug, to ED c/o emesis since yesterday.  Denies abd pain, diarrhea, urinary s/s or pain anywhere.  Was constipated yesterday but used suppository with relief.  Recently started back on lasix for fluid retention r/t kidney function.  Fever of 102.6

## 2013-01-08 NOTE — ED Notes (Signed)
Family at bedside. 

## 2013-01-08 NOTE — ED Provider Notes (Signed)
Wanda [name] S 8:00 PM patient discussed in sign out with Sherlyn Lees Wingen PA-C and Dr. Karle Starch.  Patient with history of kidney transplant on dialysis in [facility] presenting with fever. Currently without source. Blood cultures drawn. The patient had cardiac after 1 L of fluids. Fever improved slightly with medications. Feel patient requires admission for continued evaluation and observation. UA pending.   Spoke with [facility]. They will see patient and admit to team 10 telemetry bed.  Martie Lee, PA-C 01/08/13 2108

## 2013-01-08 NOTE — ED Notes (Signed)
Pt returned from CT °

## 2013-01-08 NOTE — ED Notes (Signed)
CT paged about pt's CT scan. RN informed CT that admitting MD did not want oral or IV contrast.

## 2013-01-08 NOTE — ED Notes (Signed)
RN noticed abnormal EKG rhythm. RN shot EKG and gave to [facility].

## 2013-01-08 NOTE — ED Notes (Signed)
Pt unable to give urine sample at this time 

## 2013-01-08 NOTE — ED Notes (Signed)
[place] saw pt, EDP declared no stemi.

## 2013-01-08 NOTE — Consult Note (Signed)
ANTIBIOTIC CONSULT NOTE - INITIAL  Pharmacy Consult for Vancomycin, Zosyn Indication: Empiric antibiotics in an immunosuppressed, febrile patient  Hospital Problems: Principal Problem:   Nausea and vomiting Active Problems:   DIABETES MELLITUS, TYPE II   HYPERTENSION, UNSPECIFIED   Anemia   Fever   Renal failure (ARF), acute on chronic   Allergies: Allergies  Allergen Reactions  . Hydrocodone-Acetaminophen Other (See Comments)    REACTION: headaches  . Propoxyphene-Acetaminophen Other (See Comments)    REACTION: headache    Patient Measurements: Height: 5' 10.08" (178 cm) Weight: 267 lb 3.2 oz (121.2 kg) IBW/kg (Calculated) : 68.68 Vancomycin dosing weight 120 kg  Vital Signs: BP 137/61  Pulse 107  Temp(Src) 101.2 F (38.4 C) (Oral)  Resp 18  Ht 5' 10.08" (1.78 m)  Wt 267 lb 3.2 oz (121.2 kg)  BMI 38.25 kg/m2  SpO2 95%  Labs:  Recent Labs  01/08/13 1645  WBC 8.0  HGB 9.5*  PLT 227  CREATININE 2.57*   Estimated Creatinine Clearance: 36.3 ml/min (by C-G formula based on Cr of 2.57).   Microbiology: No results found for this or any previous visit (from the past 720 hour(s)).  Medical/Surgical History: Past Medical History  Diagnosis Date  . Hyperlipidemia   . Unspecified essential hypertension   . Shingles   . Obesity   . Hypothyroidism   . Diabetes mellitus type II   . Nonspecific abnormal unspecified cardiovascular function study   . Foot pain   . Renal transplant disorder   . Migraines   . Peripheral vascular disease   . PAD (peripheral artery disease)   . Diabetic peripheral neuropathy   . Dyspnea on exertion   . Anemia   . Chronic kidney disease, stage IV (severe)     "never went on dialysis" (09/04/2012)  . Arthritis     "hands" (09/04/2012)  . Gout    Past Surgical History  Procedure Laterality Date  . Av fistula placement, radiocephalic  [id number]    "right upper arm" (09/04/2012)  . Cesarean section  1986; 1987  . Endometrial  cryoablation  1980's  . Colonoscopy    . Av fistula placement    . Transplantation renal  2013    "right" (09/04/2012)  . Cholecystectomy  2011  . Varicose vein surgery  1997    "RLE; stripped" (09/04/2012)  . Video bronchoscopy  09/07/2012    Procedure: VIDEO BRONCHOSCOPY WITHOUT FLUORO;  Surgeon: Chesley Mires, MD;  Location: [facility] ENDOSCOPY;  Service: Cardiopulmonary;  Laterality: Bilateral;    Medications:  No current facility-administered medications on file prior to encounter.   Current Outpatient Prescriptions on File Prior to Encounter  Medication Sig Dispense Refill  . allopurinol (ZYLOPRIM) 100 MG tablet Take 100 mg by mouth daily.      [name] Kitchen amLODipine (NORVASC) 10 MG tablet Take 5 mg by mouth 2 (two) times daily.       [name] Kitchen aspirin EC 81 MG tablet Take 81 mg by mouth daily.      . carvedilol (COREG) 25 MG tablet Take 37.5 mg by mouth 2 (two) times daily.       . ergocalciferol (VITAMIN D2) 50000 UNITS capsule Take 50,000 Units by mouth every 30 (thirty) days.       . hydrALAZINE (APRESOLINE) 25 MG tablet Take 37.5 mg by mouth 3 (three) times daily.      . insulin NPH (HUMULIN N,NOVOLIN N) 100 UNIT/ML injection Inject 28 Units into the skin at bedtime.       [name] Kitchen  insulin regular (NOVOLIN R,HUMULIN R) 100 units/mL injection Inject 24-28 Units into the skin 3 (three) times daily before meals. 28 units in the morning, 24 units at noon, and 24 units at 5pm.      . levothyroxine (SYNTHROID) 50 MCG tablet Take 50 mcg by mouth daily.       . magnesium oxide (MAG-OX) 400 MG tablet Take 400 mg by mouth 3 (three) times daily.      . mycophenolate (MYFORTIC) 360 MG TBEC Take 2 tablets (720 mg total) by mouth 2 (two) times daily.      [name] Kitchen omeprazole (PRILOSEC) 20 MG capsule Take 20 mg by mouth daily.      . predniSONE (DELTASONE) 1 MG tablet Take 6 mg by mouth daily.       . pregabalin (LYRICA) 75 MG capsule Take 75 mg by mouth 2 (two) times daily.      [name] Kitchen spironolactone (ALDACTONE) 50 MG tablet Take 50 mg  by mouth daily.       [name] Kitchen sulfamethoxazole-trimethoprim (BACTRIM DS) 800-160 MG per tablet Take 1 tablet by mouth every Monday, Wednesday, and Friday.      . valGANciclovir (VALCYTE) 450 MG tablet Take 900 mg by mouth daily.        Scheduled:  . [COMPLETED] acetaminophen  650 mg Oral Once  . [COMPLETED] ondansetron  4 mg Intravenous Once   Assessment:  52 y/o female with a history of chronic immunosuppression due to renal transplant.  Patient is admitted with N & V and fever on admission of 102.6 F.  Pharmacy consulted to dose Vancomycin and Zosyn.  Current estimated CrCl 36 ml/min.  Patient weight 121 kg  Goal of Therapy:   Vancomycin trough level 15-20 mcg/ml  Renal adjusted Zosyn, if required.  Plan:   Vancomycin 2500 mg IV Load, then 1500 mg IV q 24 hours   Measure antibiotic drug levels at steady state  Follow up culture results Begin Zosyn 3.375 gm IV q 8 hours, each dose to infuse over 4 hours   Wanda [name], Craig Guess,  Pharm.D.,    4/8/20149:53 PM

## 2013-01-08 NOTE — ED Notes (Signed)
Patient transported to X-ray 

## 2013-01-09 DIAGNOSIS — I10 Essential (primary) hypertension: Secondary | ICD-10-CM

## 2013-01-09 DIAGNOSIS — N184 Chronic kidney disease, stage 4 (severe): Secondary | ICD-10-CM

## 2013-01-09 DIAGNOSIS — I059 Rheumatic mitral valve disease, unspecified: Secondary | ICD-10-CM

## 2013-01-09 LAB — GLUCOSE, CAPILLARY
Glucose-Capillary: 404 mg/dL — ABNORMAL HIGH (ref 70–99)
Glucose-Capillary: 337 mg/dL — ABNORMAL HIGH (ref 70–99)
Glucose-Capillary: 375 mg/dL — ABNORMAL HIGH (ref 70–99)
Glucose-Capillary: 410 mg/dL — ABNORMAL HIGH (ref 70–99)

## 2013-01-09 LAB — BASIC METABOLIC PANEL
BUN: 36 mg/dL — ABNORMAL HIGH (ref 6–23)
BUN: 37 mg/dL — ABNORMAL HIGH (ref 6–23)
CO2: 23 mEq/L (ref 19–32)
CO2: 23 mEq/L (ref 19–32)
Calcium: 8.4 mg/dL (ref 8.4–10.5)
Calcium: 8.1 mg/dL — ABNORMAL LOW (ref 8.4–10.5)
Chloride: 98 mEq/L (ref 96–112)
Chloride: 99 mEq/L (ref 96–112)
Creatinine, Ser: 2.51 mg/dL — ABNORMAL HIGH (ref 0.50–1.10)
Creatinine, Ser: 2.36 mg/dL — ABNORMAL HIGH (ref 0.50–1.10)
GFR calc Af Amer: 24 mL/min — ABNORMAL LOW (ref >90)
GFR calc Af Amer: 26 mL/min — ABNORMAL LOW (ref >90)
GFR calc non Af Amer: 21 mL/min — ABNORMAL LOW (ref >90)
GFR calc non Af Amer: 23 mL/min — ABNORMAL LOW (ref >90)
Glucose, Bld: 425 mg/dL — ABNORMAL HIGH (ref 70–99)
Glucose, Bld: 453 mg/dL — ABNORMAL HIGH (ref 70–99)
Potassium: 4.3 mEq/L (ref 3.5–5.1)
Potassium: 4.3 mEq/L (ref 3.5–5.1)
Sodium: 133 mEq/L — ABNORMAL LOW (ref 135–145)
Sodium: 131 mEq/L — ABNORMAL LOW (ref 135–145)

## 2013-01-09 LAB — CBC
HCT: 26.1 % — ABNORMAL LOW (ref 36.0–46.0)
Hemoglobin: 8.6 g/dL — ABNORMAL LOW (ref 12.0–15.0)
MCH: 24.9 pg — ABNORMAL LOW (ref 26.0–34.0)
MCHC: 33 g/dL (ref 30.0–36.0)
MCV: 75.4 fL — ABNORMAL LOW (ref 78.0–100.0)
Platelets: 208 10*3/uL (ref 150–400)
RBC: 3.46 MIL/uL — ABNORMAL LOW (ref 3.87–5.11)
RDW: 16.5 % — ABNORMAL HIGH (ref 11.5–15.5)
WBC: 6.1 10*3/uL (ref 4.0–10.5)

## 2013-01-09 LAB — HEPATIC FUNCTION PANEL
ALT: 14 U/L (ref 0–35)
AST: 15 U/L (ref 0–37)
Albumin: 2.8 g/dL — ABNORMAL LOW (ref 3.5–5.2)
Alkaline Phosphatase: 174 U/L — ABNORMAL HIGH (ref 39–117)
Bilirubin, Direct: 0.2 mg/dL (ref 0.0–0.3)
Indirect Bilirubin: 0.3 mg/dL (ref 0.3–0.9)
Total Bilirubin: 0.5 mg/dL (ref 0.3–1.2)
Total Protein: 6.7 g/dL (ref 6.0–8.3)

## 2013-01-09 LAB — HEMOGLOBIN A1C
Hgb A1c MFr Bld: 8.7 % — ABNORMAL HIGH (ref <5.7)
Mean Plasma Glucose: 203 mg/dL — ABNORMAL HIGH (ref <117)

## 2013-01-09 LAB — LACTIC ACID, PLASMA: Lactic Acid, Venous: 2.1 mmol/L (ref 0.5–2.2)

## 2013-01-09 LAB — MRSA PCR SCREENING: MRSA by PCR: NEGATIVE

## 2013-01-09 MED ORDER — PREGABALIN 50 MG PO CAPS
75.0000 mg | ORAL_CAPSULE | Freq: Two times a day (BID) | ORAL | Status: DC
  Administered 2013-01-10: 75 mg via ORAL

## 2013-01-09 MED ORDER — INSULIN ASPART 100 UNIT/ML ~~LOC~~ SOLN
15.0000 [IU] | Freq: Once | SUBCUTANEOUS | Status: AC
  Administered 2013-01-09: 15 [IU] via SUBCUTANEOUS

## 2013-01-09 MED ORDER — TRAMADOL HCL 50 MG PO TABS
100.0000 mg | ORAL_TABLET | Freq: Once | ORAL | Status: AC
  Administered 2013-01-09: 100 mg via ORAL
  Filled 2013-01-09: qty 2

## 2013-01-09 MED ORDER — SODIUM CHLORIDE 0.9 % IV SOLN
INTRAVENOUS | Status: DC
  Administered 2013-01-09 – 2013-01-10 (×2): via INTRAVENOUS

## 2013-01-09 MED ORDER — ZOLPIDEM TARTRATE 5 MG PO TABS
5.0000 mg | ORAL_TABLET | Freq: Once | ORAL | Status: AC
  Administered 2013-01-09: 5 mg via ORAL
  Filled 2013-01-09: qty 1

## 2013-01-09 MED ORDER — INSULIN NPH (HUMAN) (ISOPHANE) 100 UNIT/ML ~~LOC~~ SUSP
35.0000 [IU] | Freq: Every day | SUBCUTANEOUS | Status: DC
  Administered 2013-01-09: 35 [IU] via SUBCUTANEOUS
  Filled 2013-01-09: qty 10

## 2013-01-09 NOTE — Progress Notes (Signed)
Utilization review completed.  

## 2013-01-09 NOTE — Progress Notes (Addendum)
Central tele reported pt had 2 pauses and began going in and out of Afib. On assessment, pt symptomatic. ECHO being done at bedside. Pt back in SR now. Dr Marye Round on floor and notified of situation. Will continue to monitor. [place], Franky Macho

## 2013-01-09 NOTE — Progress Notes (Signed)
Inpatient Diabetes Program Recommendations  AACE/ADA: New Consensus Statement on Inpatient Glycemic Control (2013)  Target Ranges:  Prepandial:   less than 140 mg/dL      Peak postprandial:   less than 180 mg/dL (1-2 hours)      Critically ill patients:  140 - 180 mg/dL     Results for KAMPBELL, DIFELICE (MRN [id number]) as of 01/09/2013 13:11  Ref. Range 01/09/2013 07:31 01/09/2013 11:28  Glucose-Capillary Latest Range: 70-99 mg/dL 404 (H) 337 (H)    Noted NPH insulin increased to 35 units QHS today (patient only received 28 units last night).  PO intake still poor on clear liquid diet.  MD- Please consider increasing Novolog correction scale (SSI) to Moderate scale tid ac + HS.    Will follow. Wyn Quaker RN, MSN, CDE Diabetes Coordinator Inpatient Diabetes Program [phone]

## 2013-01-09 NOTE — Progress Notes (Signed)
*  PRELIMINARY RESULTS* Echocardiogram 2D Echocardiogram has been performed.  Leavy Cella 01/09/2013, 2:39 PM

## 2013-01-09 NOTE — Progress Notes (Signed)
[facility] PROGRESS NOTE  Wanda [name] [id number] DOB: 1961/06/08 DOA: 01/08/2013 PCP: Myriam Jacobson, MD  Assessment/Plan: 1. Nausea and vomiting with fever - most likely  gastroenteritis. But given the patient's immunosuppressed state at this time blood cultures and urine cultures have been ordered and patient has been started on empiric antibiotics. Patient with dysuria and many bacteria on urinalysis - probably she has a UTI.   CT abdomen and pelvis without contrast shows perinephric stranding - ? Due to pyelo in teh setting of fever pyuria and lactic acidosis.  Patient did receive 1 L normal saline in the ER and one more liter infusion over 10 hours.  Fluids at 75 cc/hr for now.   2. 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   immunosuppressants discussed with Dr. Lorrene Reid and continued at doses as prior to admission   3. Diabetes mellitus type 2 uncontrolled - check hemoglobin A1c. Increase NPH for now to 35 units qhs   4. Hypertension - except for the diuretics continue home medications.   5. Hypothyroidism - continue Synthroid. Check TSH.   6. PCP px - on bactrim  7. CMv px - on Valcyte   8. Pericardial efusion - incidental finding - will get echo to better describe    Code Status: full Family Communication: patient  Disposition Plan: home   Consultants:  D/w Dr. Lorrene Reid from nephrology   Procedures:  CT abdomen   Antibiotics: Vanc and Zosyn 4/8    HPI/Subjective: No further nausea , no vomiting, no abdominal pain   Objective: Filed Vitals:   01/08/13 2215 01/08/13 2245 01/08/13 2344 01/09/13 0549  BP: 145/60 151/80 159/72 149/64  Pulse: 105 106 110 107  Temp:   99.2 F (37.3 C) 99 F (37.2 C)  TempSrc:   Oral Oral  Resp: 23 25 25 22   Height:   5' 10.8" (1.798 m)   Weight:   122.471 kg (270 lb)   SpO2: 95% 95% 98% 98%    Intake/Output Summary (Last 24 hours)  at 01/09/13 0849 Last data filed at 01/09/13 [id number]  Gross per 24 hour  Intake   2290 ml  Output    150 ml  Net   2140 ml   Filed Weights   01/08/13 2142 01/08/13 2344  Weight: 121.2 kg (267 lb 3.2 oz) 122.471 kg (270 lb)    Exam:   General:  axox3  Cardiovascular: RRR  Respiratory: CTAB   Abdomen: soft, nt ,   Musculoskeletal: inatct    Data Reviewed: Basic Metabolic Panel:  Recent Labs Lab 01/08/13 1645 01/09/13 0605  NA 133* 133*  K 4.3 4.3  CL 98 98  CO2 24 23  GLUCOSE 324* 425*  BUN 33* 36*  CREATININE 2.57* 2.51*  CALCIUM 8.6 8.4   Liver Function Tests:  Recent Labs Lab 01/08/13 1645 01/09/13 0605  AST 23 15  ALT 16 14  ALKPHOS 195* 174*  BILITOT 0.6 0.5  PROT 7.3 6.7  ALBUMIN 2.9* 2.8*    Recent Labs Lab 01/08/13 1645  LIPASE 11   No results found for this basename: AMMONIA,  in the last 168 hours CBC:  Recent Labs Lab 01/08/13 1645 01/09/13 0605  WBC 8.0 6.1  NEUTROABS 3.0  --   HGB 9.5* 8.6*  HCT 29.3* 26.1*  MCV 75.5* 75.4*  PLT 227 208   Cardiac Enzymes: No results found for this basename: CKTOTAL, CKMB, CKMBINDEX, TROPONINI,  in the last 168 hours BNP (last 3 results)  Recent Labs  11/11/12 1500  PROBNP 659.9*   CBG:  Recent Labs Lab 01/08/13 2350 01/09/13 0731  GLUCAP 339* 404*    Recent Results (from the past 240 hour(s))  MRSA PCR SCREENING     Status: None   Collection Time    01/09/13 12:07 AM      Result Value Range Status   MRSA by PCR NEGATIVE  NEGATIVE Final   Comment:            The GeneXpert MRSA Assay (FDA     approved for NASAL specimens     only), is one component of a     comprehensive MRSA colonization     surveillance program. It is not     intended to diagnose MRSA     infection nor to guide or     monitor treatment for     MRSA infections.     Studies: Ct Abdomen Pelvis Wo Contrast  01/09/2013  *RADIOLOGY REPORT*  Clinical Data: Epigastric abdominal pain, nausea and vomiting.   CT ABDOMEN AND PELVIS WITHOUT CONTRAST  Technique:  Multidetector CT imaging of the abdomen and pelvis was performed following the standard protocol without intravenous contrast.  Comparison: Abdominal radiograph performed 08/15/2005  Findings: A small to moderate pericardial effusion is noted.  Small left and trace right pleural effusions are seen, with associated atelectasis.  The liver and spleen are unremarkable in appearance.  The patient is status post cholecystectomy, with clips noted along the gallbladder fossa.  The pancreas and adrenal glands are unremarkable.  Nonspecific perinephric stranding is noted bilaterally.  Mild bilateral renal fetal lobulations are seen.  The kidneys are otherwise unremarkable in appearance.  There is no evidence of hydronephrosis.  No renal or ureteral stones are seen.  No free fluid is identified.  The small bowel is unremarkable in appearance.  The stomach is within normal limits.  No acute vascular abnormalities are seen.  The appendix is normal in caliber, without evidence for appendicitis.  The colon is largely decompressed and is unremarkable in appearance.  The bladder is mildly distended and grossly unremarkable in appearance.  The uterus is within normal limits.  An intrauterine device is noted in expected position at the fundus of the uterus. The ovaries are grossly symmetric; no suspicious adnexal masses are seen.  No inguinal lymphadenopathy is seen.  The patient's transplant kidney in the right iliac fossa demonstrates mild surrounding soft tissue stranding; there is slight prominence of the transplant renal calyces, without significant hydronephrosis.  This may reflect the patient's baseline.  No renal or ureteral stones are seen.  Surrounding postoperative change is noted.  No acute osseous abnormalities are identified.  IMPRESSION:  1.  No acute abnormalities seen to explain the patient's symptoms. 2.  Mild soft tissue stranding about the transplant kidney at  the right iliac fossa, with slight prominence of the transplant renal calyces, but no significant hydronephrosis.  This may reflect the patient's baseline. 3.  Small to moderate pericardial effusion seen. 4.  Small left and trace right pleural effusions, with associated atelectasis.   Original Report Authenticated By: Santa Lighter, M.D.    Dg Chest 2 View  01/08/2013  *RADIOLOGY REPORT*  Clinical Data: Fever, nausea, vomiting and weakness.  CHEST - 2 VIEW  Comparison: 11/11/2012  Findings: There is some increased prominence of heart size without pulmonary edema or pleural effusions.  Bibasilar atelectasis present.  No focal consolidation  is identified.  IMPRESSION: Increased prominence of heart size.  No pulmonary edema is identified.   Original Report Authenticated By: Aletta Edouard, M.D.     Scheduled Meds: . allopurinol  100 mg Oral Daily  . amLODipine  5 mg Oral BID  . aspirin EC  81 mg Oral Daily  . carvedilol  37.5 mg Oral BID WC  . heparin  5,000 Units Subcutaneous Q8H  . hydrALAZINE  37.5 mg Oral TID  . insulin aspart  0-9 Units Subcutaneous TID WC  . insulin NPH  28 Units Subcutaneous QHS  . levothyroxine  50 mcg Oral QAC breakfast  . magnesium oxide  400 mg Oral TID  . mycophenolate  720 mg Oral BID  . pantoprazole  40 mg Oral Daily  . piperacillin-tazobactam  3.375 g Intravenous Q8H  . predniSONE  6 mg Oral Q breakfast  . pregabalin  75 mg Oral BID  . sodium chloride  3 mL Intravenous Q12H  . sulfamethoxazole-trimethoprim  1 tablet Oral Q M,W,F  . tacrolimus  1 mg Oral BID  . valGANciclovir  900 mg Oral Daily  . vancomycin (VANCOCIN) IVPB  1,500 mg Intravenous Q24H  . [START ON 01/31/2013] Vitamin D (Ergocalciferol)  50,000 Units Oral Q30 days   Continuous Infusions: . sodium chloride      Principal Problem:   Nausea and vomiting Active Problems:   DIABETES MELLITUS, TYPE II   HYPERTENSION, UNSPECIFIED   Anemia   Fever   Renal failure (ARF), acute on  chronic       Tayler Lassen  [facility] Pager [phone]. If 7PM-7AM, please contact night-coverage at [email or web address], password [facility] 01/09/2013, 8:49 AM  LOS: 1 day

## 2013-01-10 LAB — URINE CULTURE
Colony Count: NO GROWTH
Culture: NO GROWTH

## 2013-01-10 LAB — CBC
HCT: 22.6 % — ABNORMAL LOW (ref 36.0–46.0)
Hemoglobin: 7.4 g/dL — ABNORMAL LOW (ref 12.0–15.0)
MCH: 24.3 pg — ABNORMAL LOW (ref 26.0–34.0)
MCHC: 32.7 g/dL (ref 30.0–36.0)
MCV: 74.3 fL — ABNORMAL LOW (ref 78.0–100.0)
Platelets: 163 10*3/uL (ref 150–400)
RBC: 3.04 MIL/uL — ABNORMAL LOW (ref 3.87–5.11)
RDW: 16.6 % — ABNORMAL HIGH (ref 11.5–15.5)
WBC: 3.1 10*3/uL — ABNORMAL LOW (ref 4.0–10.5)

## 2013-01-10 LAB — BASIC METABOLIC PANEL
BUN: 52 mg/dL — ABNORMAL HIGH (ref 6–23)
CO2: 22 mEq/L (ref 19–32)
Calcium: 8.5 mg/dL (ref 8.4–10.5)
Chloride: 97 mEq/L (ref 96–112)
Creatinine, Ser: 3.58 mg/dL — ABNORMAL HIGH (ref 0.50–1.10)
GFR calc Af Amer: 16 mL/min — ABNORMAL LOW (ref >90)
GFR calc non Af Amer: 14 mL/min — ABNORMAL LOW (ref >90)
Glucose, Bld: 364 mg/dL — ABNORMAL HIGH (ref 70–99)
Potassium: 4.4 mEq/L (ref 3.5–5.1)
Sodium: 129 mEq/L — ABNORMAL LOW (ref 135–145)

## 2013-01-10 LAB — GLUCOSE, CAPILLARY
Glucose-Capillary: 346 mg/dL — ABNORMAL HIGH (ref 70–99)
Glucose-Capillary: 362 mg/dL — ABNORMAL HIGH (ref 70–99)
Glucose-Capillary: 346 mg/dL — ABNORMAL HIGH (ref 70–99)
Glucose-Capillary: 311 mg/dL — ABNORMAL HIGH (ref 70–99)

## 2013-01-10 LAB — TACROLIMUS LEVEL: Tacrolimus (FK506) - LabCorp: 6.1 ng/mL

## 2013-01-10 MED ORDER — INSULIN NPH (HUMAN) (ISOPHANE) 100 UNIT/ML ~~LOC~~ SUSP
10.0000 [IU] | SUBCUTANEOUS | Status: AC
  Administered 2013-01-10: 10 [IU] via SUBCUTANEOUS

## 2013-01-10 MED ORDER — HYDRALAZINE HCL 25 MG PO TABS
25.0000 mg | ORAL_TABLET | Freq: Three times a day (TID) | ORAL | Status: DC
  Filled 2013-01-10 (×2): qty 1

## 2013-01-10 MED ORDER — SODIUM CHLORIDE 0.9 % IV SOLN
500.0000 mg | Freq: Every day | INTRAVENOUS | Status: DC
  Administered 2013-01-10 – 2013-01-11 (×2): 500 mg via INTRAVENOUS
  Filled 2013-01-10 (×2): qty 4

## 2013-01-10 MED ORDER — INSULIN ASPART 100 UNIT/ML ~~LOC~~ SOLN
8.0000 [IU] | Freq: Three times a day (TID) | SUBCUTANEOUS | Status: DC
  Administered 2013-01-10 – 2013-01-11 (×2): 8 [IU] via SUBCUTANEOUS

## 2013-01-10 MED ORDER — INSULIN NPH (HUMAN) (ISOPHANE) 100 UNIT/ML ~~LOC~~ SUSP
50.0000 [IU] | Freq: Every day | SUBCUTANEOUS | Status: DC
  Administered 2013-01-10: 50 [IU] via SUBCUTANEOUS
  Filled 2013-01-10: qty 10

## 2013-01-10 MED ORDER — FUROSEMIDE 10 MG/ML IJ SOLN
80.0000 mg | Freq: Four times a day (QID) | INTRAMUSCULAR | Status: DC
  Administered 2013-01-10 – 2013-01-11 (×5): 80 mg via INTRAVENOUS
  Filled 2013-01-10 (×7): qty 8

## 2013-01-10 MED ORDER — VALGANCICLOVIR HCL 450 MG PO TABS
450.0000 mg | ORAL_TABLET | Freq: Every day | ORAL | Status: DC
  Administered 2013-01-11: 450 mg via ORAL
  Filled 2013-01-10: qty 1

## 2013-01-10 NOTE — Consult Note (Signed)
Referring Provider: No ref. provider found Primary Care Physician:  Myriam Jacobson, MD Primary Nephrologist:  Dr. Lissa Merlin  Reason for Consultation:  Acute renal failure in kidney transplant recipient. Baseline creatinine 1.3 status post transplant in August 2013. Episode of acute renal failure February 2014 . 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 Her lasix was discontinued. Her follow up visit at outpatient showed a weight gain of 35 lbs. Her lasix was restarted at that point.  HPI: Wanda [name] is a 52 y.o. female with history of renal transplant presented to the ER because of increasing nausea and vomiting over the last 3-4 days. Denies any abdominal pain or diarrhea. Patient had mild constipation had used laxatives after which she had bowel movement. Her nausea vomiting had progressively worsened to the point she was not able to keep in any food but was able to take medications. In the ER patient was found to be febrile with temperatures around 101F. Patient did not have any subjective feeling of fever chills. Patient's chest x-ray was unremarkable and urine is pending. Patient denies any chest pain shortness of breath productive cough dysuria. Patient states that one of her family members had nausea and vomiting last week.      Past Medical History  Diagnosis Date  . Hyperlipidemia   . Unspecified essential hypertension   . Shingles   . Obesity   . Hypothyroidism   . Diabetes mellitus type II   . Nonspecific abnormal unspecified cardiovascular function study   . Foot pain   . Renal transplant disorder   . Migraines   . Peripheral vascular disease   . PAD (peripheral artery disease)   . Diabetic peripheral neuropathy   . Dyspnea on exertion   . Anemia   . Chronic kidney disease, stage IV (severe)     "never went on dialysis" (09/04/2012)  . Arthritis     "hands" (09/04/2012)  . Gout     Past Surgical History  Procedure  Laterality Date  . Av fistula placement, radiocephalic  [id number]    "right upper arm" (09/04/2012)  . Cesarean section  1986; 1987  . Endometrial cryoablation  1980's  . Colonoscopy    . Av fistula placement    . Transplantation renal  2013    "right" (09/04/2012)  . Cholecystectomy  2011  . Varicose vein surgery  1997    "RLE; stripped" (09/04/2012)  . Video bronchoscopy  09/07/2012    Procedure: VIDEO BRONCHOSCOPY WITHOUT FLUORO;  Surgeon: Chesley Mires, MD;  Location[facility] ENDOSCOPY;  Service: Cardiopulmonary;  Laterality: Bilateral;    Prior to Admission medications   Medication Sig Start Date End Date Taking? Authorizing Provider  allopurinol (ZYLOPRIM) 100 MG tablet Take 100 mg by mouth daily.   Yes Historical Provider, MD  amLODipine (NORVASC) 10 MG tablet Take 5 mg by mouth 2 (two) times daily.    Yes Historical Provider, MD  aspirin EC 81 MG tablet Take 81 mg by mouth daily.   Yes Historical Provider, MD  carvedilol (COREG) 25 MG tablet Take 37.5 mg by mouth 2 (two) times daily.    Yes Historical Provider, MD  ergocalciferol (VITAMIN D2) 50000 UNITS capsule Take 50,000 Units by mouth every 30 (thirty) days.    Yes Historical Provider, MD  furosemide (LASIX) 40 MG tablet Take 80 mg by mouth 2 (two) times daily.  01/04/13  Yes Historical Provider, MD  hydrALAZINE (APRESOLINE) 25 MG tablet Take 37.5 mg by mouth 3 (  three) times daily.   Yes Historical Provider, MD  insulin NPH (HUMULIN N,NOVOLIN N) 100 UNIT/ML injection Inject 28 Units into the skin at bedtime.    Yes Historical Provider, MD  insulin regular (NOVOLIN R,HUMULIN R) 100 units/mL injection Inject 24-28 Units into the skin 3 (three) times daily before meals. 28 units in the morning, 24 units at noon, and 24 units at 5pm.   Yes Historical Provider, MD  levothyroxine (SYNTHROID) 50 MCG tablet Take 50 mcg by mouth daily.    Yes Historical Provider, MD  magnesium oxide (MAG-OX) 400 MG tablet Take 400 mg by mouth 3 (three) times daily.    Yes Historical Provider, MD  mycophenolate (MYFORTIC) 360 MG TBEC Take 2 tablets (720 mg total) by mouth 2 (two) times daily. 09/06/12  Yes Sorin June Leap, MD  omeprazole (PRILOSEC) 20 MG capsule Take 20 mg by mouth daily.   Yes Historical Provider, MD  predniSONE (DELTASONE) 1 MG tablet Take 6 mg by mouth daily.    Yes Historical Provider, MD  pregabalin (LYRICA) 75 MG capsule Take 75 mg by mouth 2 (two) times daily. 11/03/12  Yes Historical Provider, MD  spironolactone (ALDACTONE) 50 MG tablet Take 50 mg by mouth daily.    Yes Historical Provider, MD  sulfamethoxazole-trimethoprim (BACTRIM DS) 800-160 MG per tablet Take 1 tablet by mouth every Monday, Wednesday, and Friday.   Yes Historical Provider, MD  tacrolimus (PROGRAF) 1 MG capsule Take 1 mg by mouth 2 (two) times daily.  12/18/12 12/18/13 Yes Historical Provider, MD  valGANciclovir (VALCYTE) 450 MG tablet Take 900 mg by mouth daily.   Yes Historical Provider, MD    Current Facility-Administered Medications  Medication Dose Route Frequency Provider Last Rate Last Dose  . 0.9 %  sodium chloride infusion   Intravenous Continuous Sorin June Leap, MD 75 mL/hr at 01/10/13 0506    . acetaminophen (TYLENOL) tablet 650 mg  650 mg Oral Q6H PRN Rise Patience, MD       Or  . acetaminophen (TYLENOL) suppository 650 mg  650 mg Rectal Q6H PRN Rise Patience, MD      . allopurinol (ZYLOPRIM) tablet 100 mg  100 mg Oral Daily Rise Patience, MD   100 mg at 01/10/13 1053  . amLODipine (NORVASC) tablet 5 mg  5 mg Oral BID Rise Patience, MD   5 mg at 01/10/13 1105  . aspirin EC tablet 81 mg  81 mg Oral Daily Rise Patience, MD   81 mg at 01/10/13 1053  . carvedilol (COREG) tablet 37.5 mg  37.5 mg Oral BID WC Rise Patience, MD   37.5 mg at 01/10/13 1053  . heparin injection 5,000 Units  5,000 Units Subcutaneous Q8H Rise Patience, MD   5,000 Units at 01/10/13 0505  . hydrALAZINE (APRESOLINE) tablet 25 mg  25 mg Oral TID Sorin C  Marye Round, MD      . insulin aspart (novoLOG) injection 0-9 Units  0-9 Units Subcutaneous TID WC Rise Patience, MD   9 Units at 01/10/13 1246  . insulin NPH (HUMULIN N,NOVOLIN N) injection 35 Units  35 Units Subcutaneous QHS Sorin June Leap, MD   35 Units at 01/09/13 2222  . levothyroxine (SYNTHROID, LEVOTHROID) tablet 50 mcg  50 mcg Oral QAC breakfast Rise Patience, MD   50 mcg at 01/10/13 1054  . magnesium oxide (MAG-OX) tablet 400 mg  400 mg Oral TID Rise Patience, MD   400 mg at  01/10/13 1053  . methylPREDNISolone sodium succinate (SOLU-MEDROL) 500 mg in sodium chloride 0.9 % 50 mL IVPB  500 mg Intravenous Daily Sorin June Leap, MD      . mycophenolate (MYFORTIC) EC tablet 720 mg  720 mg Oral BID Rise Patience, MD   720 mg at 01/10/13 1053  . ondansetron (ZOFRAN) tablet 4 mg  4 mg Oral Q6H PRN Rise Patience, MD       Or  . ondansetron [facility]) injection 4 mg  4 mg Intravenous Q6H PRN Rise Patience, MD   4 mg at 01/09/13 0116  . pantoprazole (PROTONIX) EC tablet 40 mg  40 mg Oral Daily Rise Patience, MD   40 mg at 01/10/13 1104  . pregabalin (LYRICA) capsule 75 mg  75 mg Oral BID Rise Patience, MD   75 mg at 01/09/13 2224  . sodium chloride 0.9 % injection 3 mL  3 mL Intravenous Q12H Rise Patience, MD      . sulfamethoxazole-trimethoprim (BACTRIM DS) 800-160 MG per tablet 1 tablet  1 tablet Oral Q M,W,F Rise Patience, MD   1 tablet at 01/09/13 0947  . tacrolimus (PROGRAF) capsule 1 mg  1 mg Oral BID Rise Patience, MD   1 mg at 01/10/13 1053  . [START ON 01/11/2013] valGANciclovir (VALCYTE) 450 MG tablet TABS 450 mg  450 mg Oral Daily Sorin June Leap, MD      . Derrill Memo ON 01/31/2013] Vitamin D (Ergocalciferol) (DRISDOL) capsule 50,000 Units  50,000 Units Oral Q30 days Rise Patience, MD        Allergies as of 01/08/2013 - Review Complete 01/08/2013  Allergen Reaction Noted  . Hydrocodone-acetaminophen Other (See Comments)   .  Propoxyphene-acetaminophen Other (See Comments)     Family History  Problem Relation Age of Onset  . Colon cancer Father     deceased at age 14    History   Social History  . Marital Status: Married    Spouse Name: N/A    Number of Children: 2  . Years of Education: N/A   Occupational History  . School Recruitment consultant    Social History Main Topics  . Smoking status: Never Smoker   . Smokeless tobacco: Never Used  . Alcohol Use: No  . Drug Use: No  . Sexually Active: Yes   Other Topics Concern  . Not on file   Social History Narrative  . No narrative on file    Review of Systems: [id number] with fever and chills , No history of Retinopathy. Normal external appearance No Epistaxis or Sore throat. No sinusitis.   CV: Denies chest pain, angina, palpitations, syncope, orthopnea, PND, peripheral edema, and claudication. Resp: some respiratory difficulty at rest and using oxygen GI: admitted with nausea and vomiting and abdominal discomfort MS: Denies joint pain, limitation of movement, and swelling, stiffness, low back pain, extremity pain. Denies muscle weakness, cramps, atrophy.  No use of non steroidal antiinflammatory drugs. Derm: Denies rash, itching, dry skin, hives, moles, warts, or unhealing ulcers.  Psych: Denies depression, anxiety, memory loss, suicidal ideation, hallucinations, paranoia, and confusion. Heme: Denies bruising, bleeding, and enlarged lymph nodes. Neuro: No headache.  No diplopia. No dysarthria.  No dysphasia.  No history of CVA.  No Seizures. No paresthesias.  No weakness. Endocrine  DM.  No Thyroid disease.  No Adrenal disease.  Physical Exam: Vital signs in last 24 hours: Temp:  [97.7 F (36.5 C)-98.9 F (37.2 C)] 97.8  F (36.6 C) (04/10 1403) Pulse Rate:  [66-88] 83 (04/10 1403) Resp:  [17-18] 18 (04/10 1403) BP: (102-124)/(41-58) 112/47 mmHg (04/10 1403) SpO2:  [90 %-96 %] 96 % (04/10 1403) Weight:  [122.471 kg (270 lb)] 122.471 kg (270  lb) (04/09 2033) Last BM Date: 01/09/13 (per pt report) General:   Overweight and using supplemental oxygen Eyes:  Sclera clear, no icterus.   Conjunctiva pink. Ears:  Normal auditory acuity. Nose:  No deformity, discharge,  or lesions. Mouth:  No deformity or lesions, dentition normal. Neck:  Supple; no masses or thyromegaly. JVP not elevated Lungs: diminished at bases Heart:  Regular rate and rhythm; no murmurs, clicks, rubs,  or gallops. Abdomen:  Soft,mild tenderness over allograft Msk:  Symmetrical without gross deformities. Normal posture. Pulses:  No carotid, renal, femoral bruits. DP and PT symmetrical and equal Extremities:  3+ edema Neurologic:  Alert and  oriented x4;  grossly normal neurologically. Skin:  Intact without significant lesions or rashes. Cervical Nodes:  No significant cervical adenopathy. Psych:  Alert and cooperative. Normal mood and affect.  Intake/Output from previous day: 04/09 0701 - 04/10 0700 In: 240 [P.O.:240] Out: -  Intake/Output this shift: Total I/O In: 600 [P.O.:600] Out: -   Lab Results:  Recent Labs  01/08/13 1645 01/09/13 0605 01/10/13 0710  WBC 8.0 6.1 3.1*  HGB 9.5* 8.6* 7.4*  HCT 29.3* 26.1* 22.6*  PLT 227 208 163   BMET  Recent Labs  01/09/13 0605 01/09/13 0840 01/10/13 0710  NA 133* 131* 129*  K 4.3 4.3 4.4  CL 98 99 97  CO2 23 23 22   GLUCOSE 425* 453* 364*  BUN 36* 37* 52*  CREATININE 2.51* 2.36* 3.58*  CALCIUM 8.4 8.1* 8.5   LFT  Recent Labs  01/09/13 0605  PROT 6.7  ALBUMIN 2.8*  AST 15  ALT 14  ALKPHOS 174*  BILITOT 0.5  BILIDIR 0.2  IBILI 0.3   PT/INR No results found for this basename: LABPROT, INR,  in the last 72 hours Hepatitis Panel No results found for this basename: HEPBSAG, HCVAB, HEPAIGM, HEPBIGM,  in the last 72 hours  Studies/Results: Ct Abdomen Pelvis Wo Contrast  01/09/2013  *RADIOLOGY REPORT*  Clinical Data: Epigastric abdominal pain, nausea and vomiting.  CT ABDOMEN AND  PELVIS WITHOUT CONTRAST  Technique:  Multidetector CT imaging of the abdomen and pelvis was performed following the standard protocol without intravenous contrast.  Comparison: Abdominal radiograph performed 08/15/2005  Findings: A small to moderate pericardial effusion is noted.  Small left and trace right pleural effusions are seen, with associated atelectasis.  The liver and spleen are unremarkable in appearance.  The patient is status post cholecystectomy, with clips noted along the gallbladder fossa.  The pancreas and adrenal glands are unremarkable.  Nonspecific perinephric stranding is noted bilaterally.  Mild bilateral renal fetal lobulations are seen.  The kidneys are otherwise unremarkable in appearance.  There is no evidence of hydronephrosis.  No renal or ureteral stones are seen.  No free fluid is identified.  The small bowel is unremarkable in appearance.  The stomach is within normal limits.  No acute vascular abnormalities are seen.  The appendix is normal in caliber, without evidence for appendicitis.  The colon is largely decompressed and is unremarkable in appearance.  The bladder is mildly distended and grossly unremarkable in appearance.  The uterus is within normal limits.  An intrauterine device is noted in expected position at the fundus of the uterus. The ovaries are grossly  symmetric; no suspicious adnexal masses are seen.  No inguinal lymphadenopathy is seen.  The patient's transplant kidney in the right iliac fossa demonstrates mild surrounding soft tissue stranding; there is slight prominence of the transplant renal calyces, without significant hydronephrosis.  This may reflect the patient's baseline.  No renal or ureteral stones are seen.  Surrounding postoperative change is noted.  No acute osseous abnormalities are identified.  IMPRESSION:  1.  No acute abnormalities seen to explain the patient's symptoms. 2.  Mild soft tissue stranding about the transplant kidney at the right iliac  fossa, with slight prominence of the transplant renal calyces, but no significant hydronephrosis.  This may reflect the patient's baseline. 3.  Small to moderate pericardial effusion seen. 4.  Small left and trace right pleural effusions, with associated atelectasis.   Original Report Authenticated By: Santa Lighter, M.D.    Dg Chest 2 View  01/08/2013  *RADIOLOGY REPORT*  Clinical Data: Fever, nausea, vomiting and weakness.  CHEST - 2 VIEW  Comparison: 11/11/2012  Findings: There is some increased prominence of heart size without pulmonary edema or pleural effusions.  Bibasilar atelectasis present.  No focal consolidation is identified.  IMPRESSION: Increased prominence of heart size.  No pulmonary edema is identified.   Original Report Authenticated By: Aletta Edouard, M.D.     Assessment/Plan:  Acute renal failure, no evidence of obstruction. Sterile pyuria on urinalysis and tenderness on palpation of transplant. I would think that in view of recent rejection and CT with stranding that acute rejection would be quite need to be considered. Discussed case with Dr Tamala Julian at [facility], who informs me that patient cannot be transferred as their are no beds available at this time. He recommends pulsing patient with 500 mg IV solumedrol.   HTN volume . I would recommend IV diuretics. I will start lasix at 80mg  IV q 8hours. I will discontinue amlodipine and hydralazine due to lower blood pressures  Anemia will follow  Patient waiting for transfer to [facility]   LOS: 2 Rik Wadel W @TODAY @2 :11 PM

## 2013-01-10 NOTE — Progress Notes (Signed)
[facility] PROGRESS NOTE  Wanda [name] [id number] DOB: 01/05/1961 DOA: 01/08/2013 PCP: Myriam Jacobson, MD  Assessment/Plan: 1. Nausea and vomiting with fever - most likely  gastroenteritis. But given the patient's immunosuppressed state at this time blood cultures and urine cultures have been ordered and patient has been started on empiric antibiotics. Patient with dysuria and many bacteria on urinalysis - probably she has a UTI.   CT abdomen and pelvis without contrast shows perinephric stranding - ? Due to pyelo in the setting of fever pyuria and lactic acidosis.  Patient did receive 1 L normal saline in the ER and one more liter infusion over 10 hours.  Patient was started on iv Fluids at 75 cc/hr on admission - and her creatinine did not improve from 4/9 until 4/10  D/w with Dr. Tamala Julian from [facility] on 01/10/13 and he suggested we continue iv fluids at 75 cc/hr and continue current immunosuppression therapy.    2. 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   immunosuppressants discussed with Dr. Lorrene Reid from [facility] on 01/10/2013  and continued at doses as prior to admission.   Patient failed to improve with iv fluids . Creatinine went from 2.3 to 3.58. Patient reports urinating 6 times since yesterday. Unfortunately urine was not collected so we do not know the exact amount.   3. Diabetes mellitus type 2 uncontrolled -  hemoglobin A1c 8.7. Increased  NPH for now to 35 units qhs on 01/09/13 .Patient still hyperglycemic on 01/10/13 - plan for an extra dose of NPH 10 now.   4. Hypertension - except for the diuretics continued on  home medications. On 4/10 she is on the hypotensive side - plan to decrease hydralazine to   5. Hypothyroidism - continue Synthroid.    6. PCP px - on bactrim  7. 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 D/w with Dr. Tamala Julian from [facility] transplant medicine and he suggested we  dropped the dose of valcyte to 450 mg daily on 01/10/13   8. Pericardial efusion - incidental finding on CT -  Echocardiogram indicates trivial amount    Code Status: full Family Communication: patient  Disposition Plan: home   Consultants:  D/w Dr. Lorrene Reid from nephrology   Procedures:  CT abdomen   Antibiotics: Vanc and Zosyn 4/8    HPI/Subjective: No further nausea , no vomiting, no abdominal pain   Objective: Filed Vitals:   01/09/13 1512 01/09/13 1715 01/09/13 2033 01/10/13 0506  BP: 124/58 120/56 121/53 102/41  Pulse:  88 66 84  Temp:  98.9 F (37.2 C) 98.2 F (36.8 C) 98.3 F (36.8 C)  TempSrc:  Oral Oral Oral  Resp:  18 18 18   Height:   5' 10.8" (1.798 m)   Weight:   122.471 kg (270 lb)   SpO2:  93% 93% 90%   Patient Vitals for the past 24 hrs:  BP Temp Temp src Pulse Resp SpO2 Height Weight  01/10/13 1031 112/56 mmHg 97.7 F (36.5 C) Oral 78 17 92 % - -  01/10/13 0506 102/41 mmHg 98.3 F (36.8 C) Oral 84 18 90 % - -  01/09/13 2033 121/53 mmHg 98.2 F (36.8 C) Oral 66 18 93 % 5' 10.8" (1.798 m) 122.471 kg (270 lb)  01/09/13 1715 120/56 mmHg 98.9 F (37.2 C) Oral 88 18 93 % - -  01/09/13 1512 124/58 mmHg - - - - - - -  01/09/13 1315 107/57 mmHg 98.9 F (37.2 C) Oral 92 18 94 % - -     Intake/Output Summary (Last 24 hours) at 01/10/13 1013 Last data filed at 01/10/13 [id number]  Gross per 24 hour  Intake    360 ml  Output      0 ml  Net    360 ml   Filed Weights   01/08/13 2142 01/08/13 2344 01/09/13 2033  Weight: 121.2 kg (267 lb 3.2 oz) 122.471 kg (270 lb) 122.471 kg (270 lb)    Exam:   General:  axox3  Cardiovascular: RRR  Respiratory: CTAB   Abdomen: soft, nt ,   Musculoskeletal: inatct    Data Reviewed: Basic Metabolic Panel:  Recent Labs Lab 01/08/13 1645 01/09/13 0605 01/09/13 0840 01/10/13 0710  NA 133* 133* 131* 129*  K 4.3 4.3 4.3 4.4  CL 98 98 99 97  CO2 24 23 23 22   GLUCOSE 324* 425* 453* 364*  BUN 33* 36* 37*  52*  CREATININE 2.57* 2.51* 2.36* 3.58*  CALCIUM 8.6 8.4 8.1* 8.5   Liver Function Tests:  Recent Labs Lab 01/08/13 1645 01/09/13 0605  AST 23 15  ALT 16 14  ALKPHOS 195* 174*  BILITOT 0.6 0.5  PROT 7.3 6.7  ALBUMIN 2.9* 2.8*    Recent Labs Lab 01/08/13 1645  LIPASE 11   No results found for this basename: AMMONIA,  in the last 168 hours CBC:  Recent Labs Lab 01/08/13 1645 01/09/13 0605 01/10/13 0710  WBC 8.0 6.1 3.1*  NEUTROABS 3.0  --   --   HGB 9.5* 8.6* 7.4*  HCT 29.3* 26.1* 22.6*  MCV 75.5* 75.4* 74.3*  PLT 227 208 163   Cardiac Enzymes: No results found for this basename: CKTOTAL, CKMB, CKMBINDEX, TROPONINI,  in the last 168 hours BNP (last 3 results)  Recent Labs  11/11/12 1500  PROBNP 659.9*   CBG:  Recent Labs Lab 01/09/13 0731 01/09/13 1128 01/09/13 1623 01/09/13 2038 01/10/13 0742  GLUCAP 404* 337* 375* 410* 346*    Recent Results (from the past 240 hour(s))  CULTURE, BLOOD (ROUTINE X 2)     Status: None   Collection Time    01/08/13  6:00 PM      Result Value Range Status   Specimen Description BLOOD ARM LEFT   Final   Special Requests BOTTLES DRAWN AEROBIC AND ANAEROBIC 10CC   Final   Culture  Setup Time 01/09/2013 00:50   Final   Culture     Final   Value:        BLOOD CULTURE RECEIVED NO GROWTH TO DATE CULTURE WILL BE HELD FOR 5 DAYS BEFORE ISSUING A FINAL NEGATIVE REPORT   Report Status PENDING   Incomplete  CULTURE, BLOOD (ROUTINE X 2)     Status: None   Collection Time    01/08/13  6:15 PM      Result Value Range Status   Specimen Description BLOOD ARM LEFT   Final   Special Requests BOTTLES DRAWN AEROBIC AND ANAEROBIC 10CC   Final   Culture  Setup Time 01/09/2013 00:51   Final   Culture     Final   Value:        BLOOD CULTURE RECEIVED NO GROWTH TO DATE CULTURE WILL BE HELD FOR 5 DAYS BEFORE ISSUING A FINAL NEGATIVE REPORT   Report Status PENDING   Incomplete  URINE CULTURE     Status: None   Collection Time     01/08/13  8:21 PM      Result Value Range Status   Specimen Description URINE, RANDOM   Final   Special Requests NONE   Final   Culture  Setup Time 01/08/2013 23:37   Final   Colony Count NO GROWTH   Final   Culture NO GROWTH   Final   Report Status 01/10/2013 FINAL   Final  MRSA PCR SCREENING     Status: None   Collection Time    01/09/13 12:07 AM      Result Value Range Status   MRSA by PCR NEGATIVE  NEGATIVE Final   Comment:            The GeneXpert MRSA Assay (FDA     approved for NASAL specimens     only), is one component of a     comprehensive MRSA colonization     surveillance program. It is not     intended to diagnose MRSA     infection nor to guide or     monitor treatment for     MRSA infections.     Studies: Ct Abdomen Pelvis Wo Contrast  01/09/2013  *RADIOLOGY REPORT*  Clinical Data: Epigastric abdominal pain, nausea and vomiting.  CT ABDOMEN AND PELVIS WITHOUT CONTRAST  Technique:  Multidetector CT imaging of the abdomen and pelvis was performed following the standard protocol without intravenous contrast.  Comparison: Abdominal radiograph performed 08/15/2005  Findings: A small to moderate pericardial effusion is noted.  Small left and trace right pleural effusions are seen, with associated atelectasis.  The liver and spleen are unremarkable in appearance.  The patient is status post cholecystectomy, with clips noted along the gallbladder fossa.  The pancreas and adrenal glands are unremarkable.  Nonspecific perinephric stranding is noted bilaterally.  Mild bilateral renal fetal lobulations are seen.  The kidneys are otherwise unremarkable in appearance.  There is no evidence of hydronephrosis.  No renal or ureteral stones are seen.  No free fluid is identified.  The small bowel is unremarkable in appearance.  The stomach is within normal limits.  No acute vascular abnormalities are seen.  The appendix is normal in caliber, without evidence for appendicitis.  The colon is  largely decompressed and is unremarkable in appearance.  The bladder is mildly distended and grossly unremarkable in appearance.  The uterus is within normal limits.  An intrauterine device is noted in expected position at the fundus of the uterus. The ovaries are grossly symmetric; no suspicious adnexal masses are seen.  No inguinal lymphadenopathy is seen.  The patient's transplant kidney in the right iliac fossa demonstrates mild surrounding soft tissue stranding; there is slight prominence of the transplant renal calyces, without significant hydronephrosis.  This may reflect the patient's baseline.  No renal or ureteral stones are seen.  Surrounding postoperative change is noted.  No acute osseous abnormalities are identified.  IMPRESSION:  1.  No acute abnormalities seen to explain the patient's symptoms. 2.  Mild soft tissue stranding about the transplant kidney at the right iliac fossa, with slight prominence of the transplant renal calyces, but no significant hydronephrosis.  This may reflect the patient's baseline. 3.  Small to moderate pericardial effusion seen. 4.  Small left and trace right pleural effusions, with associated atelectasis.   Original Report Authenticated By: Santa Lighter, M.D.    Dg Chest 2 View  01/08/2013  *RADIOLOGY REPORT*  Clinical Data: Fever, nausea, vomiting and weakness.  CHEST - 2 VIEW  Comparison: 11/11/2012  Findings: There  is some increased prominence of heart size without pulmonary edema or pleural effusions.  Bibasilar atelectasis present.  No focal consolidation is identified.  IMPRESSION: Increased prominence of heart size.  No pulmonary edema is identified.   Original Report Authenticated By: Aletta Edouard, M.D.     Scheduled Meds: . allopurinol  100 mg Oral Daily  . amLODipine  5 mg Oral BID  . aspirin EC  81 mg Oral Daily  . carvedilol  37.5 mg Oral BID WC  . heparin  5,000 Units Subcutaneous Q8H  . hydrALAZINE  25 mg Oral TID  . insulin aspart  0-9 Units  Subcutaneous TID WC  . insulin NPH  35 Units Subcutaneous QHS  . levothyroxine  50 mcg Oral QAC breakfast  . magnesium oxide  400 mg Oral TID  . mycophenolate  720 mg Oral BID  . pantoprazole  40 mg Oral Daily  . predniSONE  6 mg Oral Q breakfast  . pregabalin  75 mg Oral BID  . pregabalin  75 mg Oral BID  . sodium chloride  3 mL Intravenous Q12H  . sulfamethoxazole-trimethoprim  1 tablet Oral Q M,W,F  . tacrolimus  1 mg Oral BID  . valGANciclovir  900 mg Oral Daily  . [START ON 01/31/2013] Vitamin D (Ergocalciferol)  50,000 Units Oral Q30 days   Continuous Infusions: . sodium chloride 75 mL/hr at 01/10/13 [id number]    Principal Problem:   Nausea and vomiting Active Problems:   DIABETES MELLITUS, TYPE II   HYPERTENSION, UNSPECIFIED   Anemia   Fever   Renal failure (ARF), acute on chronic       Luvern Mcisaac  [facility] Pager [phone]. If 7PM-7AM, please contact night-coverage at [email or web address], password [facility] 01/10/2013, 10:13 AM  LOS: 2 days

## 2013-01-10 NOTE — ED Provider Notes (Signed)
Medical screening examination/treatment/procedure(s) were performed by non-physician practitioner and as supervising physician I was immediately available for consultation/collaboration.   Charles B. Karle Starch, MD 01/10/13 [id number]

## 2013-01-11 DIAGNOSIS — D649 Anemia, unspecified: Secondary | ICD-10-CM

## 2013-01-11 LAB — BASIC METABOLIC PANEL
BUN: 64 mg/dL — ABNORMAL HIGH (ref 6–23)
CO2: 19 mEq/L (ref 19–32)
Calcium: 8.6 mg/dL (ref 8.4–10.5)
Chloride: 95 mEq/L — ABNORMAL LOW (ref 96–112)
Creatinine, Ser: 3.63 mg/dL — ABNORMAL HIGH (ref 0.50–1.10)
GFR calc Af Amer: 16 mL/min — ABNORMAL LOW (ref >90)
GFR calc non Af Amer: 13 mL/min — ABNORMAL LOW (ref >90)
Glucose, Bld: 484 mg/dL — ABNORMAL HIGH (ref 70–99)
Potassium: 4.9 mEq/L (ref 3.5–5.1)
Sodium: 128 mEq/L — ABNORMAL LOW (ref 135–145)

## 2013-01-11 LAB — DIFFERENTIAL
Basophils Absolute: 0 10*3/uL (ref 0.0–0.1)
Basophils Relative: 1 % (ref 0–1)
Eosinophils Absolute: 0.1 10*3/uL (ref 0.0–0.7)
Eosinophils Relative: 8 % — ABNORMAL HIGH (ref 0–5)
Lymphocytes Relative: 20 % (ref 12–46)
Lymphs Abs: 0.3 10*3/uL — ABNORMAL LOW (ref 0.7–4.0)
Monocytes Absolute: 0.1 10*3/uL (ref 0.1–1.0)
Monocytes Relative: 8 % (ref 3–12)
Neutro Abs: 1 10*3/uL — ABNORMAL LOW (ref 1.7–7.7)
Neutrophils Relative %: 64 % (ref 43–77)

## 2013-01-11 LAB — GLUCOSE, CAPILLARY
Glucose-Capillary: 463 mg/dL — ABNORMAL HIGH (ref 70–99)
Glucose-Capillary: 472 mg/dL — ABNORMAL HIGH (ref 70–99)
Glucose-Capillary: 443 mg/dL — ABNORMAL HIGH (ref 70–99)

## 2013-01-11 LAB — CBC
HCT: 24.6 % — ABNORMAL LOW (ref 36.0–46.0)
Hemoglobin: 8 g/dL — ABNORMAL LOW (ref 12.0–15.0)
MCH: 23.9 pg — ABNORMAL LOW (ref 26.0–34.0)
MCHC: 32.5 g/dL (ref 30.0–36.0)
MCV: 73.4 fL — ABNORMAL LOW (ref 78.0–100.0)
Platelets: 207 10*3/uL (ref 150–400)
RBC: 3.35 MIL/uL — ABNORMAL LOW (ref 3.87–5.11)
RDW: 16.2 % — ABNORMAL HIGH (ref 11.5–15.5)
WBC: 1.6 10*3/uL — ABNORMAL LOW (ref 4.0–10.5)

## 2013-01-11 MED ORDER — INSULIN ASPART 100 UNIT/ML ~~LOC~~ SOLN: 25.0000 [IU] | Freq: Once | SUBCUTANEOUS | Status: DC

## 2013-01-11 MED ORDER — INSULIN NPH (HUMAN) (ISOPHANE) 100 UNIT/ML ~~LOC~~ SUSP: 50.0000 [IU] | Freq: Every day | SUBCUTANEOUS | Status: DC

## 2013-01-11 MED ORDER — INSULIN ASPART 100 UNIT/ML ~~LOC~~ SOLN: 8.0000 [IU] | Freq: Three times a day (TID) | SUBCUTANEOUS | Status: DC

## 2013-01-11 MED ORDER — INSULIN ASPART 100 UNIT/ML ~~LOC~~ SOLN
35.0000 [IU] | Freq: Once | SUBCUTANEOUS | Status: AC
  Administered 2013-01-11: 35 [IU] via SUBCUTANEOUS

## 2013-01-11 MED ORDER — INSULIN NPH (HUMAN) (ISOPHANE) 100 UNIT/ML ~~LOC~~ SUSP: 70.0000 [IU] | Freq: Every day | SUBCUTANEOUS | Status: DC

## 2013-01-11 MED ORDER — FUROSEMIDE 10 MG/ML IJ SOLN: 80.0000 mg | Freq: Four times a day (QID) | INTRAMUSCULAR | Status: DC

## 2013-01-11 MED ORDER — VALGANCICLOVIR HCL 450 MG PO TABS: 450.0000 mg | ORAL_TABLET | Freq: Every day | ORAL | Status: DC

## 2013-01-11 MED ORDER — SODIUM CHLORIDE 0.9 % IV SOLN: 500.0000 mg | Freq: Every day | INTRAVENOUS | Status: AC

## 2013-01-11 MED ORDER — MYCOPHENOLATE SODIUM 360 MG PO TBEC: 360.0000 mg | DELAYED_RELEASE_TABLET | Freq: Two times a day (BID) | ORAL | Status: DC

## 2013-01-11 MED ORDER — MYCOPHENOLATE SODIUM 180 MG PO TBEC
360.0000 mg | DELAYED_RELEASE_TABLET | Freq: Two times a day (BID) | ORAL | Status: DC
  Filled 2013-01-11: qty 2

## 2013-01-11 MED ORDER — INSULIN ASPART 100 UNIT/ML ~~LOC~~ SOLN
25.0000 [IU] | Freq: Once | SUBCUTANEOUS | Status: AC
  Administered 2013-01-11: 25 [IU] via SUBCUTANEOUS

## 2013-01-11 MED ORDER — INSULIN NPH (HUMAN) (ISOPHANE) 100 UNIT/ML ~~LOC~~ SUSP
10.0000 [IU] | SUBCUTANEOUS | Status: AC
  Administered 2013-01-11: 10 [IU] via SUBCUTANEOUS

## 2013-01-11 NOTE — Discharge Summary (Addendum)
Physician Discharge Summary  Wanda [name] [id number] DOB: January 17, 1961 DOA: 01/08/2013  PCP: Myriam Jacobson, MD  Admit date: 01/08/2013 Discharge date: 01/11/2013  Time spent: 60 minutes  Discharge Diagnoses:  Acute renal failure - worrisome for rejection  S/p renal transplant  Anasarca  Nausea and vomiting - unclear etiology - resolved after admission   DIABETES MELLITUS, TYPE II - uncontrolled   HYPERTENSION, UNSPECIFIED  Anemia  Fever - one episode - urine culture and blood cultures negative Leukopenia ? Etiology  Trivial pericardial efussion     Discharge Condition: hemodynamically stable  Transferred to [facility]   Diet recommendation: renal   Filed Weights   01/08/13 2142 01/08/13 2344 01/09/13 2033  Weight: 121.2 kg (267 lb 3.2 oz) 122.471 kg (270 lb) 122.471 kg (270 lb)    History of present illness:  Wanda [name] is a 52 y.o. female with history of renal transplant presented to the ER because of increasing nausea and vomiting over the last 3-4 days. Denies any abdominal pain or diarrhea. Patient had mild constipation had used laxatives after which she had bowel movement. Her nausea vomiting had progressively worsened to the point she was not able to keep in any food but was able to take medications. In the ER patient was found to be febrile with temperatures around 101F. Patient did not have any subjective feeling of fever chills. Patient's chest x-ray was unremarkable and urine is pending. Patient denies any chest pain shortness of breath productive cough dysuria. Patient states that one of her family members had nausea and vomiting last week.  Patient states that her Lasix dose was increased recently last 2 weeks ago because of increasing peripheral edema.   Hospital Course:  1. Nausea and vomiting with fever - most likely food poisoning vs brief episode of gastroenteritis. Given the patient's immunosuppressed state blood cultures and urine  cultures have been ordered on admission and patient has been started on empiric antibiotics. (vancomycin and Zosyn for the first 24 hrs of admission from 4/9-4/10.   Patient with dysuria and many bacteria on urinalysis - so we suspected  she has a UTI.  By 4/10 Blood cultures and urine cultures returned without growth and we did stop antibiotics on 4/10 She remained afebrile the whole time in the hospital.  CT abdomen and pelvis without contrast shows perinephric stranding - ? Due to pyelo in the setting of fever pyuria and lactic acidosis (was our initial thought ) - but patient also may have rejection to account for Ct findings.  Patient did receive 1 L normal saline in the ER and one more liter infusion over 10 hours on 4/10.  Patient was started on iv Fluids at 75 cc/hr on admission - and her creatinine did not improve from 4/9 until 4/10  D/w with Dr. Tamala Julian from [facility] on 01/10/13 and he suggested continue current immunosuppression therapy and add pulse dose steroids Solu medrol 500 mg iv for 3 days start date 4/10 - stop date 4/12  2. 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  Immunosuppressants discussed with Dr. Lorrene Reid from [facility] on 01/10/2013 and continued at doses as prior to admission.  Patient failed to improve with iv fluids .  Creatinine went from 2.3 on admission to 3.58 on 4/10. Patient reported urinating 6 times for the first 24 hrs.  After discussing with Dr. Tamala Julian at [facility] Dr. Justin Mend started the patient on  lasix iv 80 mg every 8 hours on 4/10 UO last 12 hrs = 350 cc. Creatinine 01/11/13 is 3.6  3. Diabetes mellitus type 2 uncontrolled - hemoglobin A1c 8.7. Increased NPH initially to 35 units qhs on 01/09/13 .Patient still hyperglycemic on 01/10/13 - and she did receive an extra dose of NPH 10 units. Increased further NPH to 50 units on 01/10/13 at bedtime and an extra dose of 10 units of NPH given on 01/11/13  AM Increased NPH to 70 units at bedtime   4. Hypertension - except for the diuretics continued on home medications. On 4/10 she was less hypertensive .we did stop norvasc and hydralazine. C/w coreg and lasix iv (see above)    5. Hypothyroidism - continue Synthroid.   6. PCP px - on bactrim  7. 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 D/w with Dr. Tamala Julian from [facility] transplant medicine and he suggested we dropped the dose of valcyte to 450 mg daily on 01/10/13  8. Pericardial efusion - incidental finding on CT - Echocardiogram indicates trivial amount  9. Leukopenia - new development since admission. D/w [facility] and decreased dose of Myfortic to 360 BID on 01/11/13    Procedures: Transthoracic Echocardiography  Patient: Wanda [name] MR #: [id number] Study Date: 01/09/2013 Gender: F Age: 52 Height: 152.4cm Weight: 122.7kg BSA: 2.45m^2 Pt. Status: Room: [place], [place], Latessa Tillis ADMITTING [place], [place] SONOGRAPHER [place], [place] cc:  ------------------------------------------------------------ LV EF: 65% - 70%  ------------------------------------------------------------ History: PMH: Pt went to [facility] two weeks ago for irregular heart rhythm. Had two pauses during this echo. Acute Renal Failure, Hypothyroidism, Dyspnea, Nausea and Vomiting Pericardial Effusion Fever. Risk factors: Hypertension. Diabetes mellitus. Dyslipidemia.  ------------------------------------------------------------ Study Conclusions  - Left ventricle: The cavity size was moderately reduced. Wall thickness was increased in a pattern of severe LVH. Systolic function was vigorous. The estimated ejection fraction was in the range of 65% to 70%. - Mitral valve: Mild regurgitation. - Left atrium: The atrium was mildly dilated. - Pulmonary arteries: Systolic pressure was mildly increased. PA peak pressure: 16mm Hg (S). - Pericardium, extracardiac: A trivial  pericardial effusion was identified. Transthoracic echocardiography. M-mode, complete 2D, spectral Doppler, and color Doppler. Height: Height: 152.4cm. Height: 60in. Weight: Weight: 122.7kg. Weight: 270lb. Body mass index: BMI: 52.8kg/m^2. Body surface area: BSA: 2.16m^2. Blood pressure: 107/57. Patient status: Inpatient. Location: Bedside.  ------------------------------------------------------------  ------------------------------------------------------------ Left ventricle: The cavity size was moderately reduced. Wall thickness was increased in a pattern of severe LVH. Systolic function was vigorous. The estimated ejection fraction was in the range of 65% to 70%.  ------------------------------------------------------------ Aortic valve: Structurally normal valve. Cusp separation was normal. Doppler: Transvalvular velocity was within the normal range. There was no stenosis. No regurgitation.  ------------------------------------------------------------ Aorta: Aortic root: The aortic root was normal in size. Ascending aorta: The ascending aorta was normal in size.  ------------------------------------------------------------ Mitral valve: Doppler: There was no evidence for stenosis. Mild regurgitation. Peak gradient: 61mm Hg (D).  ------------------------------------------------------------ Left atrium: The atrium was mildly dilated.  ------------------------------------------------------------ Right ventricle: The cavity size was normal. Systolic function was normal.  ------------------------------------------------------------ Pulmonic valve: Doppler: No significant regurgitation.  ------------------------------------------------------------ Tricuspid valve: Doppler: Trivial regurgitation.  ------------------------------------------------------------ Pulmonary artery: Systolic pressure was mildly  increased.  ------------------------------------------------------------ Right atrium: The atrium was normal in size.  ------------------------------------------------------------ Pericardium: A trivial pericardial effusion was identified.  ------------------------------------------------------------  2D measurements Normal Doppler measurements Normal Left ventricle Main pulmonary LVID ED, 39.4 mm 43-52 artery chord, Pressure, S 39  mm =30 PLAX Hg LVID ES, 32.2 mm 23-38 Mitral valve chord, Peak E vel 126 cm/s ------ PLAX Deceleration 222 ms 150-23 FS, chord, 18 % >29 time 0 PLAX Peak 6 mm ------ LVPW, ED 22.6 mm ------ gradient, D Hg IVS/LVPW 0.64 <1.3 Tricuspid valve ratio, ED Regurg peak 269 cm/s ------ Ventricular septum vel IVS, ED 14.5 mm ------ Peak RV-RA 29 mm ------ Aorta gradient, S Hg Root diam, 31 mm ------ Systemic veins ED Estimated CVP 10 mm ------ Left atrium Hg AP dim 47 mm ------ Right ventricle AP dim 1.99 cm/m^2 <2.2 Pressure, S 39 mm <30 index Hg  ------------------------------------------------------------ Prepared and Electronically Authenticated by  Mertie Moores 2014-04-09T15:01:[id number]   Consultations:  Nephrology   Discharge Exam: Filed Vitals:   01/10/13 2212 01/11/13 0535 01/11/13 0845 01/11/13 1436  BP: 116/64 109/47 138/63 115/62  Pulse: 52 50 85 69  Temp: 98 F (36.7 C) 98.7 F (37.1 C) 97.6 F (36.4 C) 97.8 F (36.6 C)  TempSrc: Oral Oral Oral Oral  Resp: 18 15 18 17   Height:      Weight:      SpO2: 96% 91% 97% 98%    General: axox3 Cardiovascular: rrr Respiratory: ctab  Abdomen : soft,nt   Discharge Instructions      Discharge Orders   Future Orders Complete By Expires     Diet renal 60/70-11-04-1198  As directed     Increase activity slowly  As directed         Medication List    STOP taking these medications       amLODipine 10 MG tablet  Commonly known as:  NORVASC     hydrALAZINE 25 MG tablet   Commonly known as:  APRESOLINE     insulin regular 100 units/mL injection  Commonly known as:  NOVOLIN R,HUMULIN R     predniSONE 1 MG tablet  Commonly known as:  DELTASONE     spironolactone 50 MG tablet  Commonly known as:  ALDACTONE      TAKE these medications       allopurinol 100 MG tablet  Commonly known as:  ZYLOPRIM  Take 100 mg by mouth daily.     aspirin EC 81 MG tablet  Take 81 mg by mouth daily.     carvedilol 25 MG tablet  Commonly known as:  COREG  Take 37.5 mg by mouth 2 (two) times daily.     ergocalciferol 50000 UNITS capsule  Commonly known as:  VITAMIN D2  Take 50,000 Units by mouth every 30 (thirty) days.     furosemide 40 MG tablet  Commonly known as:  LASIX  Take 80 mg by mouth 2 (two) times daily.     furosemide 10 MG/ML injection  Commonly known as:  LASIX  Inject 8 mLs (80 mg total) into the vein every 6 (six) hours.     insulin aspart 100 UNIT/ML injection  Commonly known as:  novoLOG  Inject 8 Units into the skin 3 (three) times daily with meals.     insulin NPH 100 UNIT/ML injection  Commonly known as:  HUMULIN N,NOVOLIN N  Inject 70 Units into the skin at bedtime.     magnesium oxide 400 MG tablet  Commonly known as:  MAG-OX  Take 400 mg by mouth 3 (three) times daily.     mycophenolate 360 MG Tbec  Commonly known as:  MYFORTIC  Take 1 tablet (360 mg total) by mouth 2 (two) times daily.  omeprazole 20 MG capsule  Commonly known as:  PRILOSEC  Take 20 mg by mouth daily.     pregabalin 75 MG capsule  Commonly known as:  LYRICA  Take 75 mg by mouth 2 (two) times daily.     sodium chloride 0.9 % SOLN 50 mL with methylPREDNISolone sodium succinate 1000 MG SOLR 500 mg  Inject 500 mg into the vein daily.     sulfamethoxazole-trimethoprim 800-160 MG per tablet  Commonly known as:  BACTRIM DS  Take 1 tablet by mouth every Monday, Wednesday, and Friday.     SYNTHROID 50 MCG tablet  Generic drug:  levothyroxine  Take 50 mcg  by mouth daily.     tacrolimus 1 MG capsule  Commonly known as:  PROGRAF  Take 1 mg by mouth 2 (two) times daily.     valGANciclovir 450 MG tablet  Commonly known as:  VALCYTE  Take 1 tablet (450 mg total) by mouth daily.          The results of significant diagnostics from this hospitalization (including imaging, microbiology, ancillary and laboratory) are listed below for reference.    Significant Diagnostic Studies: Ct Abdomen Pelvis Wo Contrast  01/09/2013  *RADIOLOGY REPORT*  Clinical Data: Epigastric abdominal pain, nausea and vomiting.  CT ABDOMEN AND PELVIS WITHOUT CONTRAST  Technique:  Multidetector CT imaging of the abdomen and pelvis was performed following the standard protocol without intravenous contrast.  Comparison: Abdominal radiograph performed 08/15/2005  Findings: A small to moderate pericardial effusion is noted.  Small left and trace right pleural effusions are seen, with associated atelectasis.  The liver and spleen are unremarkable in appearance.  The patient is status post cholecystectomy, with clips noted along the gallbladder fossa.  The pancreas and adrenal glands are unremarkable.  Nonspecific perinephric stranding is noted bilaterally.  Mild bilateral renal fetal lobulations are seen.  The kidneys are otherwise unremarkable in appearance.  There is no evidence of hydronephrosis.  No renal or ureteral stones are seen.  No free fluid is identified.  The small bowel is unremarkable in appearance.  The stomach is within normal limits.  No acute vascular abnormalities are seen.  The appendix is normal in caliber, without evidence for appendicitis.  The colon is largely decompressed and is unremarkable in appearance.  The bladder is mildly distended and grossly unremarkable in appearance.  The uterus is within normal limits.  An intrauterine device is noted in expected position at the fundus of the uterus. The ovaries are grossly symmetric; no suspicious adnexal masses are  seen.  No inguinal lymphadenopathy is seen.  The patient's transplant kidney in the right iliac fossa demonstrates mild surrounding soft tissue stranding; there is slight prominence of the transplant renal calyces, without significant hydronephrosis.  This may reflect the patient's baseline.  No renal or ureteral stones are seen.  Surrounding postoperative change is noted.  No acute osseous abnormalities are identified.  IMPRESSION:  1.  No acute abnormalities seen to explain the patient's symptoms. 2.  Mild soft tissue stranding about the transplant kidney at the right iliac fossa, with slight prominence of the transplant renal calyces, but no significant hydronephrosis.  This may reflect the patient's baseline. 3.  Small to moderate pericardial effusion seen. 4.  Small left and trace right pleural effusions, with associated atelectasis.   Original Report Authenticated By: Santa Lighter, M.D.    Dg Chest 2 View  01/08/2013  *RADIOLOGY REPORT*  Clinical Data: Fever, nausea, vomiting and weakness.  CHEST -  2 VIEW  Comparison: 11/11/2012  Findings: There is some increased prominence of heart size without pulmonary edema or pleural effusions.  Bibasilar atelectasis present.  No focal consolidation is identified.  IMPRESSION: Increased prominence of heart size.  No pulmonary edema is identified.   Original Report Authenticated By: Aletta Edouard, M.D.     Microbiology: Recent Results (from the past 240 hour(s))  CULTURE, BLOOD (ROUTINE X 2)     Status: None   Collection Time    01/08/13  6:00 PM      Result Value Range Status   Specimen Description BLOOD ARM LEFT   Final   Special Requests BOTTLES DRAWN AEROBIC AND ANAEROBIC 10CC   Final   Culture  Setup Time 01/09/2013 00:50   Final   Culture     Final   Value:        BLOOD CULTURE RECEIVED NO GROWTH TO DATE CULTURE WILL BE HELD FOR 5 DAYS BEFORE ISSUING A FINAL NEGATIVE REPORT   Report Status PENDING   Incomplete  CULTURE, BLOOD (ROUTINE X 2)      Status: None   Collection Time    01/08/13  6:15 PM      Result Value Range Status   Specimen Description BLOOD ARM LEFT   Final   Special Requests BOTTLES DRAWN AEROBIC AND ANAEROBIC 10CC   Final   Culture  Setup Time 01/09/2013 00:51   Final   Culture     Final   Value:        BLOOD CULTURE RECEIVED NO GROWTH TO DATE CULTURE WILL BE HELD FOR 5 DAYS BEFORE ISSUING A FINAL NEGATIVE REPORT   Report Status PENDING   Incomplete  URINE CULTURE     Status: None   Collection Time    01/08/13  8:21 PM      Result Value Range Status   Specimen Description URINE, RANDOM   Final   Special Requests NONE   Final   Culture  Setup Time 01/08/2013 23:37   Final   Colony Count NO GROWTH   Final   Culture NO GROWTH   Final   Report Status 01/10/2013 FINAL   Final  MRSA PCR SCREENING     Status: None   Collection Time    01/09/13 12:07 AM      Result Value Range Status   MRSA by PCR NEGATIVE  NEGATIVE Final   Comment:            The GeneXpert MRSA Assay (FDA     approved for NASAL specimens     only), is one component of a     comprehensive MRSA colonization     surveillance program. It is not     intended to diagnose MRSA     infection nor to guide or     monitor treatment for     MRSA infections.     Labs: Basic Metabolic Panel:  Recent Labs Lab 01/08/13 1645 01/09/13 0605 01/09/13 0840 01/10/13 0710 01/11/13 0530  NA 133* 133* 131* 129* 128*  K 4.3 4.3 4.3 4.4 4.9  CL 98 98 99 97 95*  CO2 24 23 23 22 19   GLUCOSE 324* 425* 453* 364* 484*  BUN 33* 36* 37* 52* 64*  CREATININE 2.57* 2.51* 2.36* 3.58* 3.63*  CALCIUM 8.6 8.4 8.1* 8.5 8.6   Liver Function Tests:  Recent Labs Lab 01/08/13 1645 01/09/13 0605  AST 23 15  ALT 16 14  ALKPHOS 195* 174*  BILITOT 0.6  0.5  PROT 7.3 6.7  ALBUMIN 2.9* 2.8*    Recent Labs Lab 01/08/13 1645  LIPASE 11   No results found for this basename: AMMONIA,  in the last 168 hours CBC:  Recent Labs Lab 01/08/13 1645 01/09/13 0605  01/10/13 0710 01/11/13 0530  WBC 8.0 6.1 3.1* 1.6*  NEUTROABS 3.0  --   --   --   HGB 9.5* 8.6* 7.4* 8.0*  HCT 29.3* 26.1* 22.6* 24.6*  MCV 75.5* 75.4* 74.3* 73.4*  PLT 227 208 163 207   Cardiac Enzymes: No results found for this basename: CKTOTAL, CKMB, CKMBINDEX, TROPONINI,  in the last 168 hours BNP: BNP (last 3 results)  Recent Labs  11/11/12 1500  PROBNP 659.9*   CBG:  Recent Labs Lab 01/10/13 1134 01/10/13 1635 01/10/13 2214 01/11/13 0729 01/11/13 1155  GLUCAP 362* 346* 311* 463* 472*       Signed:  Enrico Eaddy  [facility] 01/11/2013, 3:57 PM

## 2013-01-11 NOTE — Progress Notes (Signed)
Patient discharged transferred to [facility]. Report called to Judson Roch, RN on Unit 2300 at [facility] [phone]). Patient AVS, radiology CD's, transport consent, and transport material sent with [facility] team.  Patient placed on neutropenic precautions, with a face mask, for transportation. Patient's belongings at pt's side. Patient remains stable; no signs or symptoms of distress.

## 2013-01-11 NOTE — Progress Notes (Signed)
[facility] PROGRESS NOTE  Wanda [name] [id number] DOB: 1961-06-16 DOA: 01/08/2013 PCP: Myriam Jacobson, MD  Assessment/Plan: 1. Nausea and vomiting with fever - most likely  gastroenteritis. Blood cultures and urine cultures have been ordered on admission  and patient has been started on empiric antibiotics. Patient with dysuria and many bacteria on urinalysis - we suspected she has a UTI.   CT abdomen and pelvis without contrast shows perinephric stranding - we suspected Due to pyelo in the setting of fever pyuria and lactic acidosis - but urine culture was without growth  - working hypothesis now is rejection.  Patient did receive 1 L normal saline in the ER and one more liter infusion over 10 hours.  Patient was started on iv Fluids at 75 cc/hr on admission - and her creatinine did not improve from 4/9 until 4/10  D/w with Dr. Tamala Julian from [facility] on 01/10/13 and he suggested we continue iv fluids at 75 cc/hr and continue current immunosuppression therapy.  Dr. Justin Mend examined the patient on 01/11/13 and he discussed with Dr. Tamala Julian and patient was started on pulse dose steroids and iv lasix on 01/10/13 Creatinine stabilized by 01/11/13   2. 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   immunosuppressants discussed with Dr. Lorrene Reid from [facility] on 01/10/2013  and continued at doses as prior to admission.   Patient failed to improve with iv fluids . Creatinine went from 2.3 to 3.58. Patient reports urinating 6 times since yesterday. Unfortunately urine was not collected so we do not know the exact amount.   Prograf level 6.1 on 01/09/13   3. Diabetes mellitus type 2 uncontrolled -  hemoglobin A1c 8.7. Increased  NPH for now to 35 units qhs on 01/09/13 .Patient still hyperglycemic on 01/10/13 - received extra doses of NPH Night NPH dose on 4/10 increased to 50 units - patient still hyperglycemic. Received  supplemental NPH 10 units on the morning of 01/11/13 Night NPH increased to 70 units on 01/11/13    4. Hypertension - except for the diuretics continued on  home medications. On 4/10 we did stop hydralazine and norvasc and started lasix iv   5. Hypothyroidism - continue Synthroid.    6. PCP px - on bactrim  7. 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 D/w with Dr. Tamala Julian from [facility] transplant medicine and he suggested we dropped the dose of valcyte to 450 mg daily on 01/10/13   8. Pericardial efusion - incidental finding on CT -  Echocardiogram indicates trivial amount   9. Hyponatremia from hyervolemia - suspect will improve after diuresis   10. Leukopenia = noted 01/11/13 - afebrile . Dw/ [facility] transplant service and we did drop the Myfortic to 360 BID   Code Status: full Family Communication: patient  Disposition Plan: transfer to [facility] hospital    Consultants:  Nephrology   Procedures:  CT abdomen   Antibiotics: Vanc and Zosyn 4/8    HPI/Subjective: No further nausea , no vomiting, no abdominal pain   Objective: Filed Vitals:   01/10/13 2212 01/11/13 0535 01/11/13 0845 01/11/13 1436  BP: 116/64 109/47 138/63 115/62  Pulse: 52 50 85 69  Temp: 98 F (36.7 C) 98.7 F (37.1 C) 97.6 F (36.4 C) 97.8 F (36.6 C)  TempSrc: Oral Oral Oral Oral  Resp: 18 15 18 17   Height:      Weight:      SpO2: 96% 91% 97%  98%   Patient Vitals for the past 24 hrs:  BP Temp Temp src Pulse Resp SpO2  01/11/13 1436 115/62 mmHg 97.8 F (36.6 C) Oral 69 17 98 %  01/11/13 0845 138/63 mmHg 97.6 F (36.4 C) Oral 85 18 97 %  01/11/13 0535 109/47 mmHg 98.7 F (37.1 C) Oral 50 15 91 %  01/10/13 2212 116/64 mmHg 98 F (36.7 C) Oral 52 18 96 %  01/10/13 1748 120/62 mmHg 97.2 F (36.2 C) Oral 58 16 100 %     Intake/Output Summary (Last 24 hours) at 01/11/13 1539 Last data filed at 01/11/13 1251  Gross per 24 hour  Intake    893 ml  Output   2295 ml  Net  -1402 ml   Filed Weights   01/08/13 2142  01/08/13 2344 01/09/13 2033  Weight: 121.2 kg (267 lb 3.2 oz) 122.471 kg (270 lb) 122.471 kg (270 lb)    Exam:   General:  axox3  Cardiovascular: RRR  Respiratory: CTAB   Abdomen: soft, nt ,   Musculoskeletal: inatct    Data Reviewed: Basic Metabolic Panel:  Recent Labs Lab 01/08/13 1645 01/09/13 0605 01/09/13 0840 01/10/13 0710 01/11/13 0530  NA 133* 133* 131* 129* 128*  K 4.3 4.3 4.3 4.4 4.9  CL 98 98 99 97 95*  CO2 24 23 23 22 19   GLUCOSE 324* 425* 453* 364* 484*  BUN 33* 36* 37* 52* 64*  CREATININE 2.57* 2.51* 2.36* 3.58* 3.63*  CALCIUM 8.6 8.4 8.1* 8.5 8.6   Liver Function Tests:  Recent Labs Lab 01/08/13 1645 01/09/13 0605  AST 23 15  ALT 16 14  ALKPHOS 195* 174*  BILITOT 0.6 0.5  PROT 7.3 6.7  ALBUMIN 2.9* 2.8*    Recent Labs Lab 01/08/13 1645  LIPASE 11   No results found for this basename: AMMONIA,  in the last 168 hours CBC:  Recent Labs Lab 01/08/13 1645 01/09/13 0605 01/10/13 0710 01/11/13 0530  WBC 8.0 6.1 3.1* 1.6*  NEUTROABS 3.0  --   --   --   HGB 9.5* 8.6* 7.4* 8.0*  HCT 29.3* 26.1* 22.6* 24.6*  MCV 75.5* 75.4* 74.3* 73.4*  PLT 227 208 163 207   Cardiac Enzymes: No results found for this basename: CKTOTAL, CKMB, CKMBINDEX, TROPONINI,  in the last 168 hours BNP (last 3 results)  Recent Labs  11/11/12 1500  PROBNP 659.9*   CBG:  Recent Labs Lab 01/10/13 1134 01/10/13 1635 01/10/13 2214 01/11/13 0729 01/11/13 1155  GLUCAP 362* 346* 311* 463* 472*    Recent Results (from the past 240 hour(s))  CULTURE, BLOOD (ROUTINE X 2)     Status: None   Collection Time    01/08/13  6:00 PM      Result Value Range Status   Specimen Description BLOOD ARM LEFT   Final   Special Requests BOTTLES DRAWN AEROBIC AND ANAEROBIC 10CC   Final   Culture  Setup Time 01/09/2013 00:50   Final   Culture     Final   Value:        BLOOD CULTURE RECEIVED NO GROWTH TO DATE CULTURE WILL BE HELD FOR 5 DAYS BEFORE ISSUING A FINAL NEGATIVE  REPORT   Report Status PENDING   Incomplete  CULTURE, BLOOD (ROUTINE X 2)     Status: None   Collection Time    01/08/13  6:15 PM      Result Value Range Status   Specimen Description BLOOD ARM LEFT  Final   Special Requests BOTTLES DRAWN AEROBIC AND ANAEROBIC 10CC   Final   Culture  Setup Time 01/09/2013 00:51   Final   Culture     Final   Value:        BLOOD CULTURE RECEIVED NO GROWTH TO DATE CULTURE WILL BE HELD FOR 5 DAYS BEFORE ISSUING A FINAL NEGATIVE REPORT   Report Status PENDING   Incomplete  URINE CULTURE     Status: None   Collection Time    01/08/13  8:21 PM      Result Value Range Status   Specimen Description URINE, RANDOM   Final   Special Requests NONE   Final   Culture  Setup Time 01/08/2013 23:37   Final   Colony Count NO GROWTH   Final   Culture NO GROWTH   Final   Report Status 01/10/2013 FINAL   Final  MRSA PCR SCREENING     Status: None   Collection Time    01/09/13 12:07 AM      Result Value Range Status   MRSA by PCR NEGATIVE  NEGATIVE Final   Comment:            The GeneXpert MRSA Assay (FDA     approved for NASAL specimens     only), is one component of a     comprehensive MRSA colonization     surveillance program. It is not     intended to diagnose MRSA     infection nor to guide or     monitor treatment for     MRSA infections.     Studies: No results found.  Scheduled Meds: . allopurinol  100 mg Oral Daily  . aspirin EC  81 mg Oral Daily  . carvedilol  37.5 mg Oral BID WC  . furosemide  80 mg Intravenous Q6H  . heparin  5,000 Units Subcutaneous Q8H  . insulin aspart  0-9 Units Subcutaneous TID WC  . insulin aspart  8 Units Subcutaneous TID WC  . insulin NPH  70 Units Subcutaneous QHS  . levothyroxine  50 mcg Oral QAC breakfast  . magnesium oxide  400 mg Oral TID  . methylPREDNISolone (SOLU-MEDROL) injection  500 mg Intravenous Daily  . mycophenolate  720 mg Oral BID  . pantoprazole  40 mg Oral Daily  . pregabalin  75 mg Oral BID   . sodium chloride  3 mL Intravenous Q12H  . sulfamethoxazole-trimethoprim  1 tablet Oral Q M,W,F  . tacrolimus  1 mg Oral BID  . valGANciclovir  450 mg Oral Daily  . [START ON 01/31/2013] Vitamin D (Ergocalciferol)  50,000 Units Oral Q30 days   Continuous Infusions:    Principal Problem:   Nausea and vomiting Active Problems:   DIABETES MELLITUS, TYPE II   HYPERTENSION, UNSPECIFIED   Anemia   Fever   Renal failure (ARF), acute on chronic       Wanda [name]  [facility] Pager [phone]. If 7PM-7AM, please contact night-coverage at [email or web address], password [facility] 01/11/2013, 3:39 PM  LOS: 3 days

## 2013-01-15 LAB — CULTURE, BLOOD (ROUTINE X 2)
Culture: NO GROWTH
Culture: NO GROWTH

## 2013-03-11 ENCOUNTER — Emergency Department (HOSPITAL_COMMUNITY): Payer: 59

## 2013-03-11 ENCOUNTER — Emergency Department (HOSPITAL_COMMUNITY)
Admission: EM | Admit: 2013-03-11 | Discharge: 2013-03-11 | Disposition: A | Discharge status: Home / Self Care | Payer: 59 | Attending: Emergency Medicine | Admitting: Emergency Medicine

## 2013-03-11 ENCOUNTER — Encounter (HOSPITAL_COMMUNITY): Payer: Self-pay | Admitting: Family Medicine

## 2013-03-11 DIAGNOSIS — Z794 Long term (current) use of insulin: Secondary | ICD-10-CM | POA: Insufficient documentation

## 2013-03-11 DIAGNOSIS — S82839A Other fracture of upper and lower end of unspecified fibula, initial encounter for closed fracture: Secondary | ICD-10-CM | POA: Insufficient documentation

## 2013-03-11 DIAGNOSIS — G909 Disorder of the autonomic nervous system, unspecified: Secondary | ICD-10-CM | POA: Insufficient documentation

## 2013-03-11 DIAGNOSIS — E785 Hyperlipidemia, unspecified: Secondary | ICD-10-CM | POA: Insufficient documentation

## 2013-03-11 DIAGNOSIS — E669 Obesity, unspecified: Secondary | ICD-10-CM | POA: Insufficient documentation

## 2013-03-11 DIAGNOSIS — Z7982 Long term (current) use of aspirin: Secondary | ICD-10-CM | POA: Insufficient documentation

## 2013-03-11 DIAGNOSIS — Z8619 Personal history of other infectious and parasitic diseases: Secondary | ICD-10-CM | POA: Insufficient documentation

## 2013-03-11 DIAGNOSIS — N2589 Other disorders resulting from impaired renal tubular function: Secondary | ICD-10-CM | POA: Insufficient documentation

## 2013-03-11 DIAGNOSIS — Z8709 Personal history of other diseases of the respiratory system: Secondary | ICD-10-CM | POA: Insufficient documentation

## 2013-03-11 DIAGNOSIS — W108XXA Fall (on) (from) other stairs and steps, initial encounter: Secondary | ICD-10-CM | POA: Insufficient documentation

## 2013-03-11 DIAGNOSIS — G43909 Migraine, unspecified, not intractable, without status migrainosus: Secondary | ICD-10-CM | POA: Insufficient documentation

## 2013-03-11 DIAGNOSIS — N184 Chronic kidney disease, stage 4 (severe): Secondary | ICD-10-CM | POA: Insufficient documentation

## 2013-03-11 DIAGNOSIS — E039 Hypothyroidism, unspecified: Secondary | ICD-10-CM | POA: Insufficient documentation

## 2013-03-11 DIAGNOSIS — I129 Hypertensive chronic kidney disease with stage 1 through stage 4 chronic kidney disease, or unspecified chronic kidney disease: Secondary | ICD-10-CM | POA: Insufficient documentation

## 2013-03-11 DIAGNOSIS — S82402A Unspecified fracture of shaft of left fibula, initial encounter for closed fracture: Secondary | ICD-10-CM

## 2013-03-11 DIAGNOSIS — M109 Gout, unspecified: Secondary | ICD-10-CM | POA: Insufficient documentation

## 2013-03-11 DIAGNOSIS — E1149 Type 2 diabetes mellitus with other diabetic neurological complication: Secondary | ICD-10-CM | POA: Insufficient documentation

## 2013-03-11 DIAGNOSIS — Z8739 Personal history of other diseases of the musculoskeletal system and connective tissue: Secondary | ICD-10-CM | POA: Insufficient documentation

## 2013-03-11 DIAGNOSIS — Z8679 Personal history of other diseases of the circulatory system: Secondary | ICD-10-CM | POA: Insufficient documentation

## 2013-03-11 DIAGNOSIS — Z862 Personal history of diseases of the blood and blood-forming organs and certain disorders involving the immune mechanism: Secondary | ICD-10-CM | POA: Insufficient documentation

## 2013-03-11 DIAGNOSIS — Y9301 Activity, walking, marching and hiking: Secondary | ICD-10-CM | POA: Insufficient documentation

## 2013-03-11 DIAGNOSIS — Y9289 Other specified places as the place of occurrence of the external cause: Secondary | ICD-10-CM | POA: Insufficient documentation

## 2013-03-11 DIAGNOSIS — M19049 Primary osteoarthritis, unspecified hand: Secondary | ICD-10-CM | POA: Insufficient documentation

## 2013-03-11 DIAGNOSIS — Z79899 Other long term (current) drug therapy: Secondary | ICD-10-CM | POA: Insufficient documentation

## 2013-03-11 MED ORDER — OXYCODONE-ACETAMINOPHEN 5-325 MG PO TABS: 1.0000 | ORAL_TABLET | Freq: Once | ORAL | Status: DC

## 2013-03-11 MED ORDER — MORPHINE SULFATE 4 MG/ML IJ SOLN
4.0000 mg | Freq: Once | INTRAMUSCULAR | Status: DC
  Filled 2013-03-11: qty 1

## 2013-03-11 MED ORDER — MORPHINE SULFATE 4 MG/ML IJ SOLN
4.0000 mg | Freq: Once | INTRAMUSCULAR | Status: AC
  Administered 2013-03-11: 4 mg via INTRAMUSCULAR

## 2013-03-11 MED ORDER — OXYCODONE-ACETAMINOPHEN 5-325 MG PO TABS
1.0000 | ORAL_TABLET | Freq: Once | ORAL | Status: AC
  Administered 2013-03-11: 1 via ORAL
  Filled 2013-03-11: qty 1

## 2013-03-11 NOTE — ED Provider Notes (Signed)
I saw and evaluated the patient, reviewed the resident's note and I agree with the findings and plan.  Patient seen examined. Pain at the patient's left anterior tibia and left ankle without obvious deformity. We'll check x-rays  Leota Jacobsen, MD 03/11/13 [phone]

## 2013-03-11 NOTE — ED Provider Notes (Signed)
History     CSN: [id number] Arrival date & time 03/11/13  1641 First MD Initiated Contact with Patient 03/11/13 1719      Chief Complaint  Patient presents with  . Fall  . Leg Pain   HPI 52 y.o. F with history of diabetic neuropathy and renal transplant presenting with left leg pain after a fall.   Patient with fall while walking down 5 steps today at 4pm.  Patient states that the fall started with poor positioning of her left leg and she turned her ankle falling down. Her head went into bushes and she fell on her left leg mainly. She has pain medial to her tibia and at the top of her foot. Severe pain at the ankle in that area. Moderate pain medial to her tibia shooting down to the area that she hurts in her ankle. No numbness or tingling in the feet. She has tried some tramadol for pain with minimal relief. SMall abrasions on both legs. She was able to bear weight immediately after the fall and is able to in the ED. Pain has been constant. Appeared to have inversion of ankle.   Past Medical History  Diagnosis Date  . Hyperlipidemia   . Unspecified essential hypertension   . Shingles   . Obesity   . Hypothyroidism   . Diabetes mellitus type II   . Nonspecific abnormal unspecified cardiovascular function study   . Foot pain   . Renal transplant disorder   . Migraines   . Peripheral vascular disease   . PAD (peripheral artery disease)   . Diabetic peripheral neuropathy   . Dyspnea on exertion   . Anemia   . Chronic kidney disease, stage IV (severe)     "never went on dialysis" (09/04/2012)  . Arthritis     "hands" (09/04/2012)  . Gout     Past Surgical History  Procedure Laterality Date  . Av fistula placement, radiocephalic  [id number]    "right upper arm" (09/04/2012)  . Cesarean section  1986; 1987  . Endometrial cryoablation  1980's  . Colonoscopy    . Av fistula placement    . Transplantation renal  2013    "right" (09/04/2012)  . Cholecystectomy  2011  . Varicose vein  surgery  1997    "RLE; stripped" (09/04/2012)  . Video bronchoscopy  09/07/2012    Procedure: VIDEO BRONCHOSCOPY WITHOUT FLUORO;  Surgeon: Chesley Mires, MD;  Location: [facility] ENDOSCOPY;  Service: Cardiopulmonary;  Laterality: Bilateral;    Family History  Problem Relation Age of Onset  . Colon cancer Father     deceased at age 41    History  Substance Use Topics  . Smoking status: Never Smoker   . Smokeless tobacco: Never Used  . Alcohol Use: No    OB History   Grav Para Term Preterm Abortions TAB SAB Ect Mult Living                  Review of Systems A full 10 point review of symptoms was performed and was negative except as noted in HPI.   Allergies  Hydrocodone-acetaminophen and Propoxyphene-acetaminophen  Home Medications   Current Outpatient Rx  Name  Route  Sig  Dispense  Refill  . allopurinol (ZYLOPRIM) 100 MG tablet   Oral   Take 100 mg by mouth daily.         [name] Kitchen aspirin EC 81 MG tablet   Oral   Take 81 mg by mouth daily.         [name] Kitchen  carvedilol (COREG) 25 MG tablet   Oral   Take 37.5 mg by mouth 2 (two) times daily.          . ergocalciferol (VITAMIN D2) 50000 UNITS capsule   Oral   Take 50,000 Units by mouth every 30 (thirty) days.          . furosemide (LASIX) 40 MG tablet   Oral   Take 80 mg by mouth 2 (two) times daily.          . insulin aspart (NOVOLOG) 100 UNIT/ML injection   Subcutaneous   Inject 8 Units into the skin 3 (three) times daily with meals.   1 vial      . insulin NPH (HUMULIN N,NOVOLIN N) 100 UNIT/ML injection   Subcutaneous   Inject 70 Units into the skin at bedtime.   1 vial      . levothyroxine (SYNTHROID) 50 MCG tablet   Oral   Take 50 mcg by mouth daily.          . magnesium oxide (MAG-OX) 400 MG tablet   Oral   Take 400 mg by mouth 3 (three) times daily.         . mycophenolate (MYFORTIC) 360 MG TBEC   Oral   Take 1 tablet (360 mg total) by mouth 2 (two) times daily.         [name] Kitchen omeprazole (PRILOSEC)  20 MG capsule   Oral   Take 20 mg by mouth daily.         . pregabalin (LYRICA) 75 MG capsule   Oral   Take 75 mg by mouth 2 (two) times daily.         [name] Kitchen sulfamethoxazole-trimethoprim (BACTRIM DS) 800-160 MG per tablet   Oral   Take 1 tablet by mouth every Monday, Wednesday, and Friday.         . tacrolimus (PROGRAF) 1 MG capsule   Oral   Take 1 mg by mouth 2 (two) times daily.            BP 189/83  Pulse 87  Temp(Src) 98.4 F (36.9 C) (Oral)  Resp 20  SpO2 100%  Physical Exam  Constitutional: She is oriented to person, place, and time. She appears well-developed and well-nourished. No distress.  HENT:  Head: Normocephalic and atraumatic.  Eyes: EOM are normal. Pupils are equal, round, and reactive to light.  Neck: Normal range of motion. Neck supple.  Cardiovascular: Normal rate and regular rhythm.  Exam reveals no gallop and no friction rub.   No murmur heard. Pulmonary/Chest: Effort normal and breath sounds normal. She has no wheezes. She has no rales.  Abdominal: Soft. Bowel sounds are normal.  Musculoskeletal: Normal range of motion. She exhibits edema (1+ bilateral, local swelling noted around lateral malleolus).  Neurological: She is alert and oriented to person, place, and time.  Skin: Skin is warm and dry.  Ankle: visible erythema or swelling around lateral malleolus Range of motion is full in all directions but has pain with inversion. Strength is 5/5 in all directions except inversion due to pain Stable lateral and medial ligaments; squeeze test negative.  No pain at base of 5th MT or navicular;  No tenderness on posterior aspects of lateral and medial malleolus Able to walk 4 steps.   ED Course  Procedures (including critical care time)  Labs Reviewed - No data to display Dg Tibia/fibula Left  03/11/2013   *RADIOLOGY REPORT*  Clinical Data: Leg/ankle pain and swelling  LEFT TIBIA AND FIBULA - 2 VIEW  Comparison: None.  Findings: Nondisplaced  oblique fibular head fracture.  Possible posterior malleolar fracture on the lateral view of the distal tibia / fibula.  The joint spaces are preserved.  Moderate soft tissue swelling about the ankle, medial greater than lateral.  IMPRESSION: Nondisplaced oblique fibular head fracture.  Possible posterior malleolar fracture.  Consider dedicated ankle radiographs for further evaluation.   Original Report Authenticated By: Julian Hy, M.D.   Dg Ankle Complete Left  03/11/2013   *RADIOLOGY REPORT*  Clinical Data: Fall, ankle pain/swelling  LEFT ANKLE COMPLETE - 3+ VIEW  Comparison: None.  Findings: Suspected posterior malleolar fracture on the lateral view.  The ankle mortise is otherwise intact.  The base of the fifth metatarsal is unremarkable.  Moderate soft tissue swelling, medial greater than lateral.  IMPRESSION: Suspected posterior malleolar fracture.  Moderate soft tissue swelling.   Original Report Authenticated By: Julian Hy, M.D.   1. Fibula fracture, left, closed, initial encounter    Per [place] ankle rules, patient technically did not qualify for need of x-rays. Discussed with Dr. Zenia Resides and given history of kidney disease and poor bone metabolism, x-rays were obtained and showed the above. Interesting that patient with fibular fracture without pain in that area.   MDM  52 y.o. F with history of diabetic neuropathy and renal transplant presenting with left leg pain after a fall and found to have Fibular head fracture and posterior malleolar fracture. Patient placed in long leg splint and given crutches and instructions to not bear weight. She is to follow up with orthopedics tomorrow.   Patient with elevated BP due to pain which improved with pain control although still elevated at d/c (patient to take BP meds when she gets home).        Marin Olp, MD 03/11/13 2156

## 2013-03-11 NOTE — ED Notes (Signed)
Per pt had a fall a few days ago and twisted left leg and is having pain and swelling in leg. sts also hit head but denies LOC. She had a cut above her eye. sts main issue today is left leg.

## 2013-03-11 NOTE — ED Notes (Signed)
Ortho paged. 

## 2013-03-11 NOTE — Progress Notes (Signed)
Orthopedic Tech Progress Note Patient Details:  Wanda [name] 11/17/1960 [id number]  Ortho Devices Type of Ortho Device: Ace wrap;Post (short leg) splint;Knee Immobilizer;Crutches Ortho Device/Splint Location: LLE Ortho Device/Splint Interventions: Ordered;Application   Braulio Bosch 03/11/2013, 9:35 PM

## 2013-03-12 NOTE — ED Provider Notes (Signed)
I saw and evaluated the patient, reviewed the resident's note and I agree with the findings and plan.  Leota Jacobsen, MD 03/12/13 2006

## 2013-04-04 ENCOUNTER — Encounter (INDEPENDENT_AMBULATORY_CARE_PROVIDER_SITE_OTHER): Payer: 59 | Admitting: Ophthalmology

## 2013-04-04 DIAGNOSIS — E1165 Type 2 diabetes mellitus with hyperglycemia: Secondary | ICD-10-CM

## 2013-04-04 DIAGNOSIS — E11319 Type 2 diabetes mellitus with unspecified diabetic retinopathy without macular edema: Secondary | ICD-10-CM

## 2013-04-04 DIAGNOSIS — H26499 Other secondary cataract, unspecified eye: Secondary | ICD-10-CM

## 2013-04-04 DIAGNOSIS — E1139 Type 2 diabetes mellitus with other diabetic ophthalmic complication: Secondary | ICD-10-CM

## 2013-04-04 DIAGNOSIS — H35039 Hypertensive retinopathy, unspecified eye: Secondary | ICD-10-CM

## 2013-04-04 DIAGNOSIS — I10 Essential (primary) hypertension: Secondary | ICD-10-CM

## 2013-04-04 DIAGNOSIS — H43819 Vitreous degeneration, unspecified eye: Secondary | ICD-10-CM

## 2013-04-11 ENCOUNTER — Ambulatory Visit (INDEPENDENT_AMBULATORY_CARE_PROVIDER_SITE_OTHER): Payer: 59 | Admitting: Ophthalmology

## 2013-04-15 ENCOUNTER — Ambulatory Visit (INDEPENDENT_AMBULATORY_CARE_PROVIDER_SITE_OTHER): Payer: 59 | Admitting: Ophthalmology

## 2013-04-21 ENCOUNTER — Emergency Department (HOSPITAL_COMMUNITY)
Admission: EM | Admit: 2013-04-21 | Discharge: 2013-04-21 | Disposition: A | Discharge status: Home / Self Care | Payer: 59 | Attending: Emergency Medicine | Admitting: Emergency Medicine

## 2013-04-21 ENCOUNTER — Encounter (HOSPITAL_COMMUNITY): Payer: Self-pay | Admitting: Nurse Practitioner

## 2013-04-21 ENCOUNTER — Emergency Department (HOSPITAL_COMMUNITY): Payer: 59

## 2013-04-21 DIAGNOSIS — I129 Hypertensive chronic kidney disease with stage 1 through stage 4 chronic kidney disease, or unspecified chronic kidney disease: Secondary | ICD-10-CM | POA: Insufficient documentation

## 2013-04-21 DIAGNOSIS — Z7982 Long term (current) use of aspirin: Secondary | ICD-10-CM | POA: Insufficient documentation

## 2013-04-21 DIAGNOSIS — M109 Gout, unspecified: Secondary | ICD-10-CM | POA: Insufficient documentation

## 2013-04-21 DIAGNOSIS — Z8679 Personal history of other diseases of the circulatory system: Secondary | ICD-10-CM | POA: Insufficient documentation

## 2013-04-21 DIAGNOSIS — Z8639 Personal history of other endocrine, nutritional and metabolic disease: Secondary | ICD-10-CM | POA: Insufficient documentation

## 2013-04-21 DIAGNOSIS — M129 Arthropathy, unspecified: Secondary | ICD-10-CM | POA: Insufficient documentation

## 2013-04-21 DIAGNOSIS — E669 Obesity, unspecified: Secondary | ICD-10-CM | POA: Insufficient documentation

## 2013-04-21 DIAGNOSIS — G43909 Migraine, unspecified, not intractable, without status migrainosus: Secondary | ICD-10-CM | POA: Insufficient documentation

## 2013-04-21 DIAGNOSIS — M549 Dorsalgia, unspecified: Secondary | ICD-10-CM | POA: Insufficient documentation

## 2013-04-21 DIAGNOSIS — Z8619 Personal history of other infectious and parasitic diseases: Secondary | ICD-10-CM | POA: Insufficient documentation

## 2013-04-21 DIAGNOSIS — Z794 Long term (current) use of insulin: Secondary | ICD-10-CM | POA: Insufficient documentation

## 2013-04-21 DIAGNOSIS — Z79899 Other long term (current) drug therapy: Secondary | ICD-10-CM | POA: Insufficient documentation

## 2013-04-21 DIAGNOSIS — E1149 Type 2 diabetes mellitus with other diabetic neurological complication: Secondary | ICD-10-CM | POA: Insufficient documentation

## 2013-04-21 DIAGNOSIS — Z87448 Personal history of other diseases of urinary system: Secondary | ICD-10-CM | POA: Insufficient documentation

## 2013-04-21 DIAGNOSIS — Z862 Personal history of diseases of the blood and blood-forming organs and certain disorders involving the immune mechanism: Secondary | ICD-10-CM | POA: Insufficient documentation

## 2013-04-21 DIAGNOSIS — E039 Hypothyroidism, unspecified: Secondary | ICD-10-CM | POA: Insufficient documentation

## 2013-04-21 DIAGNOSIS — N390 Urinary tract infection, site not specified: Secondary | ICD-10-CM | POA: Insufficient documentation

## 2013-04-21 DIAGNOSIS — E1142 Type 2 diabetes mellitus with diabetic polyneuropathy: Secondary | ICD-10-CM | POA: Insufficient documentation

## 2013-04-21 DIAGNOSIS — R0602 Shortness of breath: Secondary | ICD-10-CM | POA: Insufficient documentation

## 2013-04-21 DIAGNOSIS — N184 Chronic kidney disease, stage 4 (severe): Secondary | ICD-10-CM | POA: Insufficient documentation

## 2013-04-21 LAB — URINE MICROSCOPIC-ADD ON

## 2013-04-21 LAB — CBC
HCT: 30.3 % — ABNORMAL LOW (ref 36.0–46.0)
Hemoglobin: 9.7 g/dL — ABNORMAL LOW (ref 12.0–15.0)
MCH: 23.5 pg — ABNORMAL LOW (ref 26.0–34.0)
MCHC: 32 g/dL (ref 30.0–36.0)
MCV: 73.4 fL — ABNORMAL LOW (ref 78.0–100.0)
Platelets: 258 10*3/uL (ref 150–400)
RBC: 4.13 MIL/uL (ref 3.87–5.11)
RDW: 17.2 % — ABNORMAL HIGH (ref 11.5–15.5)
WBC: 10.4 10*3/uL (ref 4.0–10.5)

## 2013-04-21 LAB — BASIC METABOLIC PANEL
BUN: 28 mg/dL — ABNORMAL HIGH (ref 6–23)
CO2: 25 mEq/L (ref 19–32)
Calcium: 8.7 mg/dL (ref 8.4–10.5)
Chloride: 103 mEq/L (ref 96–112)
Creatinine, Ser: 1.84 mg/dL — ABNORMAL HIGH (ref 0.50–1.10)
GFR calc Af Amer: 35 mL/min — ABNORMAL LOW (ref >90)
GFR calc non Af Amer: 30 mL/min — ABNORMAL LOW (ref >90)
Glucose, Bld: 111 mg/dL — ABNORMAL HIGH (ref 70–99)
Potassium: 3.5 mEq/L (ref 3.5–5.1)
Sodium: 137 mEq/L (ref 135–145)

## 2013-04-21 LAB — URINALYSIS, ROUTINE W REFLEX MICROSCOPIC
Bilirubin Urine: NEGATIVE
Glucose, UA: NEGATIVE mg/dL
Hgb urine dipstick: NEGATIVE
Ketones, ur: NEGATIVE mg/dL
Nitrite: NEGATIVE
Protein, ur: 100 mg/dL — AB
Specific Gravity, Urine: 1.009 (ref 1.005–1.030)
Urobilinogen, UA: 0.2 mg/dL (ref 0.0–1.0)
pH: 6 (ref 5.0–8.0)

## 2013-04-21 LAB — PRO B NATRIURETIC PEPTIDE: Pro B Natriuretic peptide (BNP): 1008 pg/mL — ABNORMAL HIGH (ref 0–125)

## 2013-04-21 LAB — POCT I-STAT TROPONIN I: Troponin i, poc: 0.05 ng/mL (ref 0.00–0.08)

## 2013-04-21 MED ORDER — HYDROMORPHONE HCL PF 2 MG/ML IJ SOLN
2.0000 mg | Freq: Once | INTRAMUSCULAR | Status: AC
  Administered 2013-04-21: 2 mg via INTRAMUSCULAR
  Filled 2013-04-21: qty 1

## 2013-04-21 MED ORDER — OXYCODONE-ACETAMINOPHEN 5-325 MG PO TABS: 2.0000 | ORAL_TABLET | Freq: Four times a day (QID) | ORAL | Status: DC | PRN

## 2013-04-21 MED ORDER — ONDANSETRON HCL 4 MG PO TABS: 4.0000 mg | ORAL_TABLET | Freq: Four times a day (QID) | ORAL | Status: DC

## 2013-04-21 MED ORDER — CEPHALEXIN 500 MG PO CAPS: 500.0000 mg | ORAL_CAPSULE | Freq: Four times a day (QID) | ORAL | Status: DC

## 2013-04-21 NOTE — ED Provider Notes (Signed)
History    CSN: [id number] Arrival date & time 04/21/13  1333  First MD Initiated Contact with Patient 04/21/13 1345     Chief Complaint  Patient presents with  . Back Pain   (Consider location/radiation/quality/duration/timing/severity/associated sxs/prior Treatment) HPI Comments: Patient is a 52 year old female with history of kidney transplant, diabetes, hypothyroidism, hyperlipidemia, hypertension, obesity, peripheral vascular disease who presents today with back pain. The pain is achy and on the right side of her back without radiation. It started one week ago and she was seen at urgent care where she was found to have an elevated white blood cell count. Her pain improved and she did not followup on these lab tests. Last night her pain came back and was very severe. She states she could not sleep and breathing became difficult due to her pain. She took Tylenol without any relief. She denies any memory of straining her back. No fevers, chills, nausea, vomiting, no pain, diarrhea, dysuria, urinary urgency, urinary frequency, hematuria.  The history is provided by the patient. No language interpreter was used.   Past Medical History  Diagnosis Date  . Hyperlipidemia   . Unspecified essential hypertension   . Shingles   . Obesity   . Hypothyroidism   . Diabetes mellitus type II   . Nonspecific abnormal unspecified cardiovascular function study   . Foot pain   . Renal transplant disorder   . Migraines   . Peripheral vascular disease   . PAD (peripheral artery disease)   . Diabetic peripheral neuropathy   . Dyspnea on exertion   . Anemia   . Chronic kidney disease, stage IV (severe)     "never went on dialysis" (09/04/2012)  . Arthritis     "hands" (09/04/2012)  . Gout    Past Surgical History  Procedure Laterality Date  . Av fistula placement, radiocephalic  [id number]    "right upper arm" (09/04/2012)  . Cesarean section  1986; 1987  . Endometrial cryoablation  1980's  .  Colonoscopy    . Av fistula placement    . Transplantation renal  2013    "right" (09/04/2012)  . Cholecystectomy  2011  . Varicose vein surgery  1997    "RLE; stripped" (09/04/2012)  . Video bronchoscopy  09/07/2012    Procedure: VIDEO BRONCHOSCOPY WITHOUT FLUORO;  Surgeon: Chesley Mires, MD;  Location: [facility] ENDOSCOPY;  Service: Cardiopulmonary;  Laterality: Bilateral;   Family History  Problem Relation Age of Onset  . Colon cancer Father     deceased at age 32   History  Substance Use Topics  . Smoking status: Never Smoker   . Smokeless tobacco: Never Used  . Alcohol Use: No   OB History   Grav Para Term Preterm Abortions TAB SAB Ect Mult Living                 Review of Systems  Constitutional: Negative for fever and chills.  Respiratory: Positive for shortness of breath.   Cardiovascular: Negative for chest pain.  Gastrointestinal: Negative for nausea, vomiting and abdominal pain.  Genitourinary: Negative for dysuria, urgency, frequency and difficulty urinating.  Musculoskeletal: Positive for back pain.  All other systems reviewed and are negative.    Allergies  Hydrocodone-acetaminophen and Propoxyphene-acetaminophen  Home Medications   Current Outpatient Rx  Name  Route  Sig  Dispense  Refill  . allopurinol (ZYLOPRIM) 100 MG tablet   Oral   Take 100 mg by mouth daily.         [name] Kitchen  aspirin EC 81 MG tablet   Oral   Take 81 mg by mouth daily.         . carvedilol (COREG) 25 MG tablet   Oral   Take 37.5 mg by mouth 2 (two) times daily.          . ergocalciferol (VITAMIN D2) 50000 UNITS capsule   Oral   Take 50,000 Units by mouth every 30 (thirty) days.          . furosemide (LASIX) 40 MG tablet   Oral   Take 80 mg by mouth 2 (two) times daily.          . insulin aspart (NOVOLOG) 100 UNIT/ML injection   Subcutaneous   Inject 8 Units into the skin 3 (three) times daily with meals.   1 vial      . insulin NPH (HUMULIN N,NOVOLIN N) 100 UNIT/ML  injection   Subcutaneous   Inject 70 Units into the skin at bedtime.   1 vial      . levothyroxine (SYNTHROID) 50 MCG tablet   Oral   Take 50 mcg by mouth daily.          . magnesium oxide (MAG-OX) 400 MG tablet   Oral   Take 400 mg by mouth 3 (three) times daily.         . mycophenolate (MYFORTIC) 360 MG TBEC   Oral   Take 1 tablet (360 mg total) by mouth 2 (two) times daily.         [name] Kitchen omeprazole (PRILOSEC) 20 MG capsule   Oral   Take 20 mg by mouth daily.         [name] Kitchen oxyCODONE-acetaminophen (PERCOCET/ROXICET) 5-325 MG per tablet   Oral   Take 1 tablet by mouth once.   30 tablet   0   . pregabalin (LYRICA) 75 MG capsule   Oral   Take 75 mg by mouth 2 (two) times daily.         [name] Kitchen sulfamethoxazole-trimethoprim (BACTRIM DS) 800-160 MG per tablet   Oral   Take 1 tablet by mouth every Monday, Wednesday, and Friday.         . tacrolimus (PROGRAF) 1 MG capsule   Oral   Take 1 mg by mouth 2 (two) times daily.           BP 138/99  Pulse 90  Temp(Src) 97.9 F (36.6 C) (Oral)  Resp 22  SpO2 98% Physical Exam  Nursing note and vitals reviewed. Constitutional: She is oriented to person, place, and time. She appears well-developed and well-nourished. No distress.  HENT:  Head: Normocephalic and atraumatic.  Right Ear: External ear normal.  Left Ear: External ear normal.  Nose: Nose normal.  Mouth/Throat: Oropharynx is clear and moist.  Eyes: Conjunctivae are normal.  Neck: Normal range of motion.  Cardiovascular: Normal rate, regular rhythm, normal heart sounds, intact distal pulses and normal pulses.   Pulmonary/Chest: Effort normal and breath sounds normal. No stridor. No respiratory distress. She has no wheezes. She has no rales.  Abdominal: Soft. She exhibits no distension.  Musculoskeletal: Normal range of motion.       Thoracic back: She exhibits tenderness and pain. She exhibits no bony tenderness and no deformity.       Back:  Neurological: She  is alert and oriented to person, place, and time. She has normal strength.  Skin: Skin is warm and dry. She is not diaphoretic. No erythema.  Psychiatric: She has a  normal mood and affect. Her behavior is normal.    ED Course  Procedures (including critical care time) Labs Reviewed  CBC - Abnormal; Notable for the following:    Hemoglobin 9.7 (*)    HCT 30.3 (*)    MCV 73.4 (*)    MCH 23.5 (*)    RDW 17.2 (*)    All other components within normal limits  BASIC METABOLIC PANEL - Abnormal; Notable for the following:    Glucose, Bld 111 (*)    BUN 28 (*)    Creatinine, Ser 1.84 (*)    GFR calc non Af Amer 30 (*)    GFR calc Af Amer 35 (*)    All other components within normal limits  PRO B NATRIURETIC PEPTIDE - Abnormal; Notable for the following:    Pro B Natriuretic peptide (BNP) 1008.0 (*)    All other components within normal limits  URINALYSIS, ROUTINE W REFLEX MICROSCOPIC - Abnormal; Notable for the following:    Protein, ur 100 (*)    Leukocytes, UA SMALL (*)    All other components within normal limits  URINE MICROSCOPIC-ADD ON - Abnormal; Notable for the following:    Bacteria, UA FEW (*)    All other components within normal limits  URINE CULTURE  POCT I-STAT TROPONIN I   Dg Chest 2 View  04/21/2013   *RADIOLOGY REPORT*  Clinical Data: Chest pain for 4 days.  CHEST - 2 VIEW  Comparison: PA and lateral chest 01/08/2013 and CT chest 09/06/2012.  Findings: Minimal linear atelectasis is seen in the right lung base.  The lungs are otherwise clear.  There is mild enlargement of the cardiopericardial silhouettes.  No pneumothorax or pleural fluid is identified.  IMPRESSION:  1.  No acute finding. 2.  Mild enlargement of the cardiopericardial silhouette.   Original Report Authenticated By: Orlean Patten, M.D.   1. UTI (lower urinary tract infection)   2. Back pain     MDM  Patient is at her baseline creatinine. Will treat for early uti. Afebrile, normotensive, no  vomiting. D/c with rx for pain meds and keflex. I believe her back pain is more msk in nature. Patient feels significantly improved prior to discharge. Follow up with PCP. Discussed case with Dr. Zenia Resides who agrees with plan. Return instructions given. Vital signs stable for discharge. Patient / Family / Caregiver informed of clinical course, understand medical decision-making process, and agree with plan.    Elwyn Lade, PA-C 04/21/13 1627

## 2013-04-21 NOTE — ED Notes (Signed)
Pt presents to department for evaluation of upper back pain radiating to front of chest. Ongoing since Thursday. Pt states pain became worse last night. 10/10 pain, becomes worse with movement and palpation. Respirations unlabored. Skin warm and dry. Pt is alert and oriented x4.

## 2013-04-21 NOTE — ED Notes (Signed)
C/o back pain last week that resolved and last night returned and was so severe she couldn't sleep. Pt states this morning the pain made her feel SOB. Pt is 1 year post kidney transplant and had abnormal labs at an [facility] last week that she did not f/u on

## 2013-04-22 NOTE — ED Provider Notes (Signed)
Medical screening examination/treatment/procedure(s) were performed by non-physician practitioner and as supervising physician I was immediately available for consultation/collaboration.  Jeniah Kishi T Exodus Kutzer, MD 04/22/13 1117 

## 2013-04-23 LAB — URINE CULTURE
Colony Count: NO GROWTH
Culture: NO GROWTH

## 2013-07-14 ENCOUNTER — Emergency Department (HOSPITAL_COMMUNITY)
Admission: EM | Admit: 2013-07-14 | Discharge: 2013-07-14 | Disposition: A | Discharge status: Home / Self Care | Payer: 59 | Attending: Emergency Medicine | Admitting: Emergency Medicine

## 2013-07-14 ENCOUNTER — Encounter (HOSPITAL_COMMUNITY): Payer: Self-pay | Admitting: Emergency Medicine

## 2013-07-14 DIAGNOSIS — Z7982 Long term (current) use of aspirin: Secondary | ICD-10-CM | POA: Insufficient documentation

## 2013-07-14 DIAGNOSIS — E1149 Type 2 diabetes mellitus with other diabetic neurological complication: Secondary | ICD-10-CM | POA: Insufficient documentation

## 2013-07-14 DIAGNOSIS — E039 Hypothyroidism, unspecified: Secondary | ICD-10-CM | POA: Insufficient documentation

## 2013-07-14 DIAGNOSIS — E1142 Type 2 diabetes mellitus with diabetic polyneuropathy: Secondary | ICD-10-CM | POA: Insufficient documentation

## 2013-07-14 DIAGNOSIS — IMO0002 Reserved for concepts with insufficient information to code with codable children: Secondary | ICD-10-CM | POA: Insufficient documentation

## 2013-07-14 DIAGNOSIS — M129 Arthropathy, unspecified: Secondary | ICD-10-CM | POA: Insufficient documentation

## 2013-07-14 DIAGNOSIS — Z79899 Other long term (current) drug therapy: Secondary | ICD-10-CM | POA: Insufficient documentation

## 2013-07-14 DIAGNOSIS — T25219A Burn of second degree of unspecified ankle, initial encounter: Secondary | ICD-10-CM | POA: Insufficient documentation

## 2013-07-14 DIAGNOSIS — M109 Gout, unspecified: Secondary | ICD-10-CM | POA: Insufficient documentation

## 2013-07-14 DIAGNOSIS — N184 Chronic kidney disease, stage 4 (severe): Secondary | ICD-10-CM | POA: Insufficient documentation

## 2013-07-14 DIAGNOSIS — E669 Obesity, unspecified: Secondary | ICD-10-CM | POA: Insufficient documentation

## 2013-07-14 DIAGNOSIS — Z792 Long term (current) use of antibiotics: Secondary | ICD-10-CM | POA: Insufficient documentation

## 2013-07-14 DIAGNOSIS — T25221A Burn of second degree of right foot, initial encounter: Secondary | ICD-10-CM

## 2013-07-14 DIAGNOSIS — I129 Hypertensive chronic kidney disease with stage 1 through stage 4 chronic kidney disease, or unspecified chronic kidney disease: Secondary | ICD-10-CM | POA: Insufficient documentation

## 2013-07-14 DIAGNOSIS — Z8619 Personal history of other infectious and parasitic diseases: Secondary | ICD-10-CM | POA: Insufficient documentation

## 2013-07-14 DIAGNOSIS — T25229A Burn of second degree of unspecified foot, initial encounter: Secondary | ICD-10-CM | POA: Insufficient documentation

## 2013-07-14 DIAGNOSIS — G43909 Migraine, unspecified, not intractable, without status migrainosus: Secondary | ICD-10-CM | POA: Insufficient documentation

## 2013-07-14 DIAGNOSIS — Z862 Personal history of diseases of the blood and blood-forming organs and certain disorders involving the immune mechanism: Secondary | ICD-10-CM | POA: Insufficient documentation

## 2013-07-14 DIAGNOSIS — Y9389 Activity, other specified: Secondary | ICD-10-CM | POA: Insufficient documentation

## 2013-07-14 DIAGNOSIS — W92XXXA Exposure to excessive heat of man-made origin, initial encounter: Secondary | ICD-10-CM | POA: Insufficient documentation

## 2013-07-14 DIAGNOSIS — Z794 Long term (current) use of insulin: Secondary | ICD-10-CM | POA: Insufficient documentation

## 2013-07-14 DIAGNOSIS — Y92009 Unspecified place in unspecified non-institutional (private) residence as the place of occurrence of the external cause: Secondary | ICD-10-CM | POA: Insufficient documentation

## 2013-07-14 MED ORDER — SILVER SULFADIAZINE 1 % EX CREA: TOPICAL_CREAM | Freq: Two times a day (BID) | CUTANEOUS | Status: DC

## 2013-07-14 MED ORDER — SILVER SULFADIAZINE 1 % EX CREA
TOPICAL_CREAM | Freq: Once | CUTANEOUS | Status: AC
  Administered 2013-07-14: 21:00:00 via TOPICAL
  Filled 2013-07-14: qty 85

## 2013-07-14 MED ORDER — ACETAMINOPHEN 500 MG PO TABS
1000.0000 mg | ORAL_TABLET | Freq: Once | ORAL | Status: AC
  Administered 2013-07-14: 1000 mg via ORAL
  Filled 2013-07-14: qty 2
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
## 2013-07-24 ENCOUNTER — Encounter (HOSPITAL_COMMUNITY): Payer: Self-pay | Admitting: Emergency Medicine

## 2013-07-24 ENCOUNTER — Inpatient Hospital Stay (HOSPITAL_COMMUNITY)
Admission: EM | Admit: 2013-07-24 | Discharge: 2013-07-26 | DRG: 683 | Disposition: A | Discharge status: Home / Self Care | Payer: 59 | Attending: Internal Medicine | Admitting: Internal Medicine

## 2013-07-24 DIAGNOSIS — R630 Anorexia: Secondary | ICD-10-CM | POA: Diagnosis present

## 2013-07-24 DIAGNOSIS — Y830 Surgical operation with transplant of whole organ as the cause of abnormal reaction of the patient, or of later complication, without mention of misadventure at the time of the procedure: Secondary | ICD-10-CM | POA: Diagnosis present

## 2013-07-24 DIAGNOSIS — R0609 Other forms of dyspnea: Secondary | ICD-10-CM

## 2013-07-24 DIAGNOSIS — E871 Hypo-osmolality and hyponatremia: Secondary | ICD-10-CM

## 2013-07-24 DIAGNOSIS — N179 Acute kidney failure, unspecified: Principal | ICD-10-CM

## 2013-07-24 DIAGNOSIS — R059 Cough, unspecified: Secondary | ICD-10-CM

## 2013-07-24 DIAGNOSIS — E1149 Type 2 diabetes mellitus with other diabetic neurological complication: Secondary | ICD-10-CM | POA: Diagnosis present

## 2013-07-24 DIAGNOSIS — E119 Type 2 diabetes mellitus without complications: Secondary | ICD-10-CM

## 2013-07-24 DIAGNOSIS — N184 Chronic kidney disease, stage 4 (severe): Secondary | ICD-10-CM

## 2013-07-24 DIAGNOSIS — D631 Anemia in chronic kidney disease: Secondary | ICD-10-CM | POA: Diagnosis present

## 2013-07-24 DIAGNOSIS — R9430 Abnormal result of cardiovascular function study, unspecified: Secondary | ICD-10-CM

## 2013-07-24 DIAGNOSIS — E162 Hypoglycemia, unspecified: Secondary | ICD-10-CM

## 2013-07-24 DIAGNOSIS — E872 Acidosis, unspecified: Secondary | ICD-10-CM | POA: Diagnosis present

## 2013-07-24 DIAGNOSIS — I739 Peripheral vascular disease, unspecified: Secondary | ICD-10-CM | POA: Diagnosis present

## 2013-07-24 DIAGNOSIS — E669 Obesity, unspecified: Secondary | ICD-10-CM

## 2013-07-24 DIAGNOSIS — T8610 Unspecified complication of kidney transplant: Secondary | ICD-10-CM

## 2013-07-24 DIAGNOSIS — Z7982 Long term (current) use of aspirin: Secondary | ICD-10-CM

## 2013-07-24 DIAGNOSIS — J189 Pneumonia, unspecified organism: Secondary | ICD-10-CM

## 2013-07-24 DIAGNOSIS — D649 Anemia, unspecified: Secondary | ICD-10-CM

## 2013-07-24 DIAGNOSIS — Z87898 Personal history of other specified conditions: Secondary | ICD-10-CM

## 2013-07-24 DIAGNOSIS — B029 Zoster without complications: Secondary | ICD-10-CM

## 2013-07-24 DIAGNOSIS — IMO0002 Reserved for concepts with insufficient information to code with codable children: Secondary | ICD-10-CM | POA: Diagnosis present

## 2013-07-24 DIAGNOSIS — I129 Hypertensive chronic kidney disease with stage 1 through stage 4 chronic kidney disease, or unspecified chronic kidney disease: Secondary | ICD-10-CM | POA: Diagnosis present

## 2013-07-24 DIAGNOSIS — R0989 Other specified symptoms and signs involving the circulatory and respiratory systems: Secondary | ICD-10-CM

## 2013-07-24 DIAGNOSIS — E785 Hyperlipidemia, unspecified: Secondary | ICD-10-CM

## 2013-07-24 DIAGNOSIS — M109 Gout, unspecified: Secondary | ICD-10-CM | POA: Diagnosis present

## 2013-07-24 DIAGNOSIS — E1165 Type 2 diabetes mellitus with hyperglycemia: Secondary | ICD-10-CM | POA: Diagnosis present

## 2013-07-24 DIAGNOSIS — E039 Hypothyroidism, unspecified: Secondary | ICD-10-CM

## 2013-07-24 DIAGNOSIS — R05 Cough: Secondary | ICD-10-CM

## 2013-07-24 DIAGNOSIS — R112 Nausea with vomiting, unspecified: Secondary | ICD-10-CM

## 2013-07-24 DIAGNOSIS — R509 Fever, unspecified: Secondary | ICD-10-CM

## 2013-07-24 DIAGNOSIS — E1129 Type 2 diabetes mellitus with other diabetic kidney complication: Secondary | ICD-10-CM | POA: Diagnosis present

## 2013-07-24 DIAGNOSIS — E1142 Type 2 diabetes mellitus with diabetic polyneuropathy: Secondary | ICD-10-CM | POA: Diagnosis present

## 2013-07-24 DIAGNOSIS — Z79899 Other long term (current) drug therapy: Secondary | ICD-10-CM

## 2013-07-24 DIAGNOSIS — Z794 Long term (current) use of insulin: Secondary | ICD-10-CM

## 2013-07-24 DIAGNOSIS — I10 Essential (primary) hypertension: Secondary | ICD-10-CM

## 2013-07-24 DIAGNOSIS — N189 Chronic kidney disease, unspecified: Secondary | ICD-10-CM

## 2013-07-24 DIAGNOSIS — Z6841 Body Mass Index (BMI) 40.0 and over, adult: Secondary | ICD-10-CM

## 2013-07-24 DIAGNOSIS — R0602 Shortness of breath: Secondary | ICD-10-CM

## 2013-07-24 HISTORY — DX: Sleep apnea, unspecified: G47.30

## 2013-07-24 HISTORY — DX: Pneumonia, unspecified organism: J18.9

## 2013-07-24 HISTORY — DX: Gastro-esophageal reflux disease without esophagitis: K21.9

## 2013-07-24 LAB — GLUCOSE, CAPILLARY
Glucose-Capillary: 71 mg/dL (ref 70–99)
Glucose-Capillary: 147 mg/dL — ABNORMAL HIGH (ref 70–99)
Glucose-Capillary: 167 mg/dL — ABNORMAL HIGH (ref 70–99)
Glucose-Capillary: 157 mg/dL — ABNORMAL HIGH (ref 70–99)
Glucose-Capillary: 199 mg/dL — ABNORMAL HIGH (ref 70–99)

## 2013-07-24 LAB — CBC WITH DIFFERENTIAL/PLATELET
Basophils Absolute: 0 10*3/uL (ref 0.0–0.1)
Basophils Relative: 0 % (ref 0–1)
Eosinophils Absolute: 0.1 10*3/uL (ref 0.0–0.7)
Eosinophils Relative: 1 % (ref 0–5)
HCT: 26.9 % — ABNORMAL LOW (ref 36.0–46.0)
Hemoglobin: 8.6 g/dL — ABNORMAL LOW (ref 12.0–15.0)
Lymphocytes Relative: 9 % — ABNORMAL LOW (ref 12–46)
Lymphs Abs: 1 10*3/uL (ref 0.7–4.0)
MCH: 23.6 pg — ABNORMAL LOW (ref 26.0–34.0)
MCHC: 32 g/dL (ref 30.0–36.0)
MCV: 73.9 fL — ABNORMAL LOW (ref 78.0–100.0)
Monocytes Absolute: 0.3 10*3/uL (ref 0.1–1.0)
Monocytes Relative: 3 % (ref 3–12)
Neutro Abs: 9.8 10*3/uL — ABNORMAL HIGH (ref 1.7–7.7)
Neutrophils Relative %: 87 % — ABNORMAL HIGH (ref 43–77)
Platelets: 204 10*3/uL (ref 150–400)
RBC: 3.64 MIL/uL — ABNORMAL LOW (ref 3.87–5.11)
RDW: 20.9 % — ABNORMAL HIGH (ref 11.5–15.5)
WBC: 11.2 10*3/uL — ABNORMAL HIGH (ref 4.0–10.5)

## 2013-07-24 LAB — CBC
HCT: 25.5 % — ABNORMAL LOW (ref 36.0–46.0)
Hemoglobin: 8.4 g/dL — ABNORMAL LOW (ref 12.0–15.0)
MCH: 24.1 pg — ABNORMAL LOW (ref 26.0–34.0)
MCHC: 32.9 g/dL (ref 30.0–36.0)
MCV: 73.3 fL — ABNORMAL LOW (ref 78.0–100.0)
Platelets: 220 10*3/uL (ref 150–400)
RBC: 3.48 MIL/uL — ABNORMAL LOW (ref 3.87–5.11)
RDW: 20.7 % — ABNORMAL HIGH (ref 11.5–15.5)
WBC: 13.9 10*3/uL — ABNORMAL HIGH (ref 4.0–10.5)

## 2013-07-24 LAB — COMPREHENSIVE METABOLIC PANEL
ALT: 9 U/L (ref 0–35)
AST: 11 U/L (ref 0–37)
Albumin: 2.1 g/dL — ABNORMAL LOW (ref 3.5–5.2)
Alkaline Phosphatase: 109 U/L (ref 39–117)
BUN: 51 mg/dL — ABNORMAL HIGH (ref 6–23)
CO2: 16 mEq/L — ABNORMAL LOW (ref 19–32)
Calcium: 8.6 mg/dL (ref 8.4–10.5)
Chloride: 106 mEq/L (ref 96–112)
Creatinine, Ser: 6.18 mg/dL — ABNORMAL HIGH (ref 0.50–1.10)
GFR calc Af Amer: 8 mL/min — ABNORMAL LOW (ref >90)
GFR calc non Af Amer: 7 mL/min — ABNORMAL LOW (ref >90)
Glucose, Bld: 54 mg/dL — ABNORMAL LOW (ref 70–99)
Potassium: 4.2 mEq/L (ref 3.5–5.1)
Sodium: 137 mEq/L (ref 135–145)
Total Bilirubin: 0.1 mg/dL — ABNORMAL LOW (ref 0.3–1.2)
Total Protein: 6 g/dL (ref 6.0–8.3)

## 2013-07-24 LAB — CREATININE, SERUM
Creatinine, Ser: 6.1 mg/dL — ABNORMAL HIGH (ref 0.50–1.10)
GFR calc Af Amer: 8 mL/min — ABNORMAL LOW (ref >90)
GFR calc non Af Amer: 7 mL/min — ABNORMAL LOW (ref >90)

## 2013-07-24 LAB — PHOSPHORUS: Phosphorus: 4.8 mg/dL — ABNORMAL HIGH (ref 2.3–4.6)

## 2013-07-24 LAB — MAGNESIUM: Magnesium: 2 mg/dL (ref 1.5–2.5)

## 2013-07-24 MED ORDER — ASPIRIN EC 81 MG PO TBEC
81.0000 mg | DELAYED_RELEASE_TABLET | Freq: Every day | ORAL | Status: DC
  Administered 2013-07-24 – 2013-07-26 (×3): 81 mg via ORAL
  Filled 2013-07-24 (×3): qty 1

## 2013-07-24 MED ORDER — ACETAMINOPHEN 325 MG PO TABS: 650.0000 mg | ORAL_TABLET | Freq: Four times a day (QID) | ORAL | Status: DC | PRN

## 2013-07-24 MED ORDER — TACROLIMUS 1 MG PO CAPS
5.0000 mg | ORAL_CAPSULE | Freq: Two times a day (BID) | ORAL | Status: DC
  Administered 2013-07-24 – 2013-07-26 (×4): 5 mg via ORAL
  Filled 2013-07-24 (×5): qty 5

## 2013-07-24 MED ORDER — MAGNESIUM OXIDE 400 (241.3 MG) MG PO TABS
400.0000 mg | ORAL_TABLET | Freq: Three times a day (TID) | ORAL | Status: DC
Start: 2013-07-24 — End: 2013-07-26
  Administered 2013-07-24 – 2013-07-26 (×5): 400 mg via ORAL
  Filled 2013-07-24 (×7): qty 1

## 2013-07-24 MED ORDER — LEVOTHYROXINE SODIUM 50 MCG PO TABS
50.0000 ug | ORAL_TABLET | Freq: Every day | ORAL | Status: DC
  Administered 2013-07-25 – 2013-07-26 (×2): 50 ug via ORAL
  Filled 2013-07-24 (×4): qty 1

## 2013-07-24 MED ORDER — SODIUM CHLORIDE 0.9 % IV SOLN
INTRAVENOUS | Status: DC
  Administered 2013-07-24 – 2013-07-25 (×2): via INTRAVENOUS

## 2013-07-24 MED ORDER — MYCOPHENOLATE SODIUM 180 MG PO TBEC
720.0000 mg | DELAYED_RELEASE_TABLET | Freq: Two times a day (BID) | ORAL | Status: DC
  Administered 2013-07-24 – 2013-07-25 (×2): 720 mg via ORAL
  Filled 2013-07-24 (×3): qty 4

## 2013-07-24 MED ORDER — AMLODIPINE BESYLATE 10 MG PO TABS
10.0000 mg | ORAL_TABLET | Freq: Every day | ORAL | Status: DC
  Administered 2013-07-24 – 2013-07-26 (×3): 10 mg via ORAL
  Filled 2013-07-24 (×3): qty 1

## 2013-07-24 MED ORDER — INSULIN ASPART 100 UNIT/ML ~~LOC~~ SOLN
0.0000 [IU] | Freq: Three times a day (TID) | SUBCUTANEOUS | Status: DC
  Administered 2013-07-25: 3 [IU] via SUBCUTANEOUS
  Administered 2013-07-25: 2 [IU] via SUBCUTANEOUS
  Administered 2013-07-25: 5 [IU] via SUBCUTANEOUS
  Administered 2013-07-26: 7 [IU] via SUBCUTANEOUS
  Administered 2013-07-26: 5 [IU] via SUBCUTANEOUS

## 2013-07-24 MED ORDER — PREGABALIN 50 MG PO CAPS
150.0000 mg | ORAL_CAPSULE | Freq: Two times a day (BID) | ORAL | Status: DC
  Administered 2013-07-24 – 2013-07-25 (×2): 150 mg via ORAL
  Filled 2013-07-24 (×2): qty 3

## 2013-07-24 MED ORDER — CARVEDILOL 25 MG PO TABS
37.5000 mg | ORAL_TABLET | Freq: Two times a day (BID) | ORAL | Status: DC
  Administered 2013-07-24 – 2013-07-26 (×4): 37.5 mg via ORAL
  Filled 2013-07-24 (×5): qty 1

## 2013-07-24 MED ORDER — SEVELAMER CARBONATE 800 MG PO TABS
1600.0000 mg | ORAL_TABLET | Freq: Three times a day (TID) | ORAL | Status: DC
  Administered 2013-07-25 – 2013-07-26 (×5): 1600 mg via ORAL
  Filled 2013-07-24 (×8): qty 2

## 2013-07-24 MED ORDER — HEPARIN SODIUM (PORCINE) 5000 UNIT/ML IJ SOLN
5000.0000 [IU] | Freq: Three times a day (TID) | INTRAMUSCULAR | Status: DC
  Administered 2013-07-24 – 2013-07-26 (×5): 5000 [IU] via SUBCUTANEOUS
  Filled 2013-07-24 (×8): qty 1

## 2013-07-24 MED ORDER — ONDANSETRON HCL 4 MG PO TABS: 4.0000 mg | ORAL_TABLET | Freq: Four times a day (QID) | ORAL | Status: DC | PRN

## 2013-07-24 MED ORDER — SODIUM BICARBONATE 650 MG PO TABS
650.0000 mg | ORAL_TABLET | Freq: Two times a day (BID) | ORAL | Status: DC
  Administered 2013-07-24 – 2013-07-26 (×4): 650 mg via ORAL
  Filled 2013-07-24 (×5): qty 1

## 2013-07-24 MED ORDER — FAMOTIDINE 40 MG PO TABS
40.0000 mg | ORAL_TABLET | Freq: Every day | ORAL | Status: DC
  Administered 2013-07-24 – 2013-07-25 (×2): 40 mg via ORAL
  Filled 2013-07-24 (×2): qty 1

## 2013-07-24 MED ORDER — INSULIN GLARGINE 100 UNIT/ML ~~LOC~~ SOLN
5.0000 [IU] | Freq: Every day | SUBCUTANEOUS | Status: DC
  Administered 2013-07-25: 5 [IU] via SUBCUTANEOUS
  Filled 2013-07-24 (×2): qty 0.05

## 2013-07-24 MED ORDER — ALLOPURINOL 100 MG PO TABS
100.0000 mg | ORAL_TABLET | Freq: Every day | ORAL | Status: DC
  Administered 2013-07-24 – 2013-07-26 (×3): 100 mg via ORAL
  Filled 2013-07-24 (×3): qty 1

## 2013-07-24 MED ORDER — ACETAMINOPHEN 650 MG RE SUPP: 650.0000 mg | Freq: Four times a day (QID) | RECTAL | Status: DC | PRN

## 2013-07-24 MED ORDER — SILVER SULFADIAZINE 1 % EX CREA
TOPICAL_CREAM | Freq: Two times a day (BID) | CUTANEOUS | Status: DC
  Administered 2013-07-24 – 2013-07-26 (×3): via TOPICAL
  Filled 2013-07-24: qty 85

## 2013-07-24 MED ORDER — MORPHINE SULFATE 2 MG/ML IJ SOLN: 1.0000 mg | INTRAMUSCULAR | Status: DC | PRN

## 2013-07-24 MED ORDER — ONDANSETRON HCL 4 MG/2ML IJ SOLN: 4.0000 mg | Freq: Four times a day (QID) | INTRAMUSCULAR | Status: DC | PRN

## 2013-07-24 MED ORDER — INSULIN GLARGINE 100 UNIT/ML ~~LOC~~ SOLN: 5.0000 [IU] | Freq: Every day | SUBCUTANEOUS | Status: DC

## 2013-07-24 NOTE — ED Notes (Signed)
IV established IV to left upper arm.
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
## 2013-07-24 NOTE — ED Provider Notes (Signed)
CSN: [id number]     Arrival date & time 07/24/13  1125 History   First MD Initiated Contact with Patient 07/24/13 1143     Chief Complaint  Patient presents with  . Hypoglycemia   (Consider location/radiation/quality/duration/timing/severity/associated sxs/prior Treatment) Patient is a 52 y.o. female presenting with hypoglycemia. The history is provided by the patient. No language interpreter was used.  Hypoglycemia Initial blood sugar:  42 Blood sugar after intervention:  50 Severity:  Severe Progression:  Improving Diabetic status:  Controlled with insulin Current diabetic therapy:  Inslulin lantus and regular Time since last antidiabetic medication:  13 hours Context: decreased oral intake   Context: not new diabetes diagnosis and not recent illness   Associated symptoms: no shortness of breath   Risk factors: family hx of diabetes, family hx of hypoglycemia and kidney disease   Risk factors: no recent surgery    Pt is a 53 year old female who presents with an episode of hypoglycemia this morning. Reports that EMS was called out to her house this morning and do to an episode of low blood sugar, she reports that her meter did not give a number but gave a "low" reading. She did not eat or drink anything this morning and EMS was called again for a second "low" reading. She was then given a total of 50g of D50. She was alert and responsive on arrival to the emergency department. Her initial blood sugar here was 71. She denies any recent illness, fever or chills. She denies chest pain, shortness of breath or difficulty breathing. She denies any dysuria or urinary symptoms. She is having normal bowel movements. She is not having any difficulty eating or drinking. She reports that her blood sugar was running high last night and she adjusted her regular insulin by a sliding scale. She reports that instead of giving herself 24 units before bedtime last night she gave herself 30 units and believes  this is the reason for her low reading this morning.    Past Medical History  Diagnosis Date  . Hyperlipidemia   . Unspecified essential hypertension   . Shingles   . Obesity   . Hypothyroidism   . Diabetes mellitus type II   . Nonspecific abnormal unspecified cardiovascular function study   . Foot pain   . Renal transplant disorder   . Migraines   . Peripheral vascular disease   . PAD (peripheral artery disease)   . Diabetic peripheral neuropathy   . Dyspnea on exertion   . Anemia   . Chronic kidney disease, stage IV (severe)     "never went on dialysis" (09/04/2012)  . Arthritis     "hands" (09/04/2012)  . Gout    Past Surgical History  Procedure Laterality Date  . Av fistula placement, radiocephalic  [id number]    "right upper arm" (09/04/2012)  . Cesarean section  1986; 1987  . Endometrial cryoablation  1980's  . Colonoscopy    . Av fistula placement    . Transplantation renal  2013    "right" (09/04/2012)  . Cholecystectomy  2011  . Varicose vein surgery  1997    "RLE; stripped" (09/04/2012)  . Video bronchoscopy  09/07/2012    Procedure: VIDEO BRONCHOSCOPY WITHOUT FLUORO;  Surgeon: Chesley Mires, MD;  Location: [facility] ENDOSCOPY;  Service: Cardiopulmonary;  Laterality: Bilateral;   Family History  Problem Relation Age of Onset  . Colon cancer Father     deceased at age 73   History  Substance Use  Topics  . Smoking status: Never Smoker   . Smokeless tobacco: Never Used  . Alcohol Use: No   OB History   Grav Para Term Preterm Abortions TAB SAB Ect Mult Living                 Review of Systems  Constitutional: Negative for fever and chills.  Respiratory: Negative for cough and shortness of breath.   Cardiovascular: Negative for chest pain.  Gastrointestinal: Negative for abdominal pain.  Genitourinary: Negative for dysuria.  Neurological: Negative for weakness.  All other systems reviewed and are negative.    Allergies  Hydrocodone-acetaminophen and  Propoxyphene-acetaminophen  Home Medications   Current Outpatient Rx  Name  Route  Sig  Dispense  Refill  . allopurinol (ZYLOPRIM) 100 MG tablet   Oral   Take 100 mg by mouth daily.         [name] Kitchen amLODipine (NORVASC) 10 MG tablet   Oral   Take 10 mg by mouth daily.         [name] Kitchen aspirin EC 81 MG tablet   Oral   Take 81 mg by mouth daily.         . carvedilol (COREG) 12.5 MG tablet   Oral   Take 37.5 mg by mouth every 12 (twelve) hours.         . ergocalciferol (VITAMIN D2) 50000 UNITS capsule   Oral   Take 50,000 Units by mouth every 30 (thirty) days.          . furosemide (LASIX) 40 MG tablet   Oral   Take 80 mg by mouth 2 (two) times daily.          . hydrALAZINE (APRESOLINE) 50 MG tablet   Oral   Take 75 mg by mouth every 8 (eight) hours.         . insulin glargine (LANTUS) 100 UNIT/ML injection   Subcutaneous   Inject 30 Units into the skin at bedtime.         . insulin regular (NOVOLIN R,HUMULIN R) 100 units/mL injection   Subcutaneous   Inject 24-28 Units into the skin 2 (two) times daily before a meal. 28 units every morning  , and 24 units at noon         . levothyroxine (SYNTHROID) 50 MCG tablet   Oral   Take 50 mcg by mouth daily.          . magnesium oxide (MAG-OX) 400 MG tablet   Oral   Take 400 mg by mouth 3 (three) times daily.         . mycophenolate (MYFORTIC) 180 MG EC tablet   Oral   Take 720 mg by mouth 2 (two) times daily.         . pregabalin (LYRICA) 75 MG capsule   Oral   Take 150-225 mg by mouth 2 (two) times daily. 150mg  in the morning and 225mg  in the evening         . Sennosides-Docusate Sodium (DOK PLUS PO)   Oral   Take 2 tablets by mouth 2 (two) times daily.         . sevelamer carbonate (RENVELA) 800 MG tablet   Oral   Take 1,600 mg by mouth 3 (three) times daily with meals.         . silver sulfADIAZINE (SILVADENE) 1 % cream   Topical   Apply topically 2 (two) times daily.   50 g   0   .  sulfamethoxazole-trimethoprim (BACTRIM DS) 800-160 MG per tablet   Oral   Take 1 tablet by mouth every Monday, Wednesday, and Friday.         . tacrolimus (PROGRAF) 1 MG capsule   Oral   Take 5 mg by mouth 2 (two) times daily.          BP 126/58  Pulse 65  Resp 18  SpO2 99% Physical Exam  Nursing note and vitals reviewed. Constitutional: She is oriented to person, place, and time. She appears well-developed and well-nourished. No distress.  HENT:  Head: Normocephalic and atraumatic.  Right Ear: External ear normal.  Left Ear: External ear normal.  Mouth/Throat: Oropharynx is clear and moist.  Eyes: Pupils are equal, round, and reactive to light.  Neck: Normal range of motion. Neck supple. No JVD present. No tracheal deviation present. No thyromegaly present.  Cardiovascular: Normal rate, regular rhythm and normal heart sounds.   Pulmonary/Chest: Effort normal and breath sounds normal. No respiratory distress. She has no wheezes.  Abdominal: Soft. Bowel sounds are normal. She exhibits no distension. There is no tenderness. There is no rebound and no guarding.  Musculoskeletal: Normal range of motion.  Lymphadenopathy:    She has no cervical adenopathy.  Neurological: She is alert and oriented to person, place, and time.  Skin: Skin is warm and dry.  Psychiatric: She has a normal mood and affect. Her behavior is normal. Judgment and thought content normal.    ED Course  Procedures (including critical care time) Labs Review Labs Reviewed  CBC WITH DIFFERENTIAL - Abnormal; Notable for the following:    WBC 11.2 (*)    RBC 3.64 (*)    Hemoglobin 8.6 (*)    HCT 26.9 (*)    MCV 73.9 (*)    MCH 23.6 (*)    RDW 20.9 (*)    Neutrophils Relative % 87 (*)    Neutro Abs 9.8 (*)    Lymphocytes Relative 9 (*)    All other components within normal limits  GLUCOSE, CAPILLARY  COMPREHENSIVE METABOLIC PANEL   Imaging Review No results found.  EKG Interpretation   None        MDM   1. Hypoglycemia     Resolved hypoglycemia however, renal function is decreased with elevation in her BUN= 51 and Creatnine=6.18. Talked with PCP to give update of status and [facility] Dr. Dyann Kief called for consult. Dr. Dyann Kief talked with her nephrologist, Dr. Justin Mend and they both agree it best to admit her for hydration and further evaluation of her kidney disease.    Elisha Headland, NP 07/24/13 1554

## 2013-07-24 NOTE — ED Notes (Signed)
Checked patient blood sugar it was 147 notified RN Erika of blood sugar

## 2013-07-24 NOTE — ED Notes (Signed)
Checked patient blood sugar it was 71 notified RN Erika of blood sugar

## 2013-07-24 NOTE — ED Notes (Signed)
Pt arrived from home by [facility] with c/o hypoglycemia. EMS was called out earlier this morning for hypoglycemic episode, they got her CBG up and pt was not transported to facility. Pt did not eat anything and EMS was called out again CBG initially 42 after total 50g D50 pt CBG 50. Pt is now alert a little lethargic having trouble getting words out. Denies any pain.

## 2013-07-24 NOTE — ED Notes (Signed)
Team here to star IV
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
## 2013-07-25 DIAGNOSIS — E785 Hyperlipidemia, unspecified: Secondary | ICD-10-CM

## 2013-07-25 DIAGNOSIS — T8610 Unspecified complication of kidney transplant: Secondary | ICD-10-CM

## 2013-07-25 DIAGNOSIS — D649 Anemia, unspecified: Secondary | ICD-10-CM

## 2013-07-25 DIAGNOSIS — E669 Obesity, unspecified: Secondary | ICD-10-CM

## 2013-07-25 LAB — URINALYSIS, ROUTINE W REFLEX MICROSCOPIC
Bilirubin Urine: NEGATIVE
Glucose, UA: 250 mg/dL — AB
Ketones, ur: NEGATIVE mg/dL
Nitrite: NEGATIVE
Protein, ur: >300 mg/dL — AB
Specific Gravity, Urine: 1.016 (ref 1.005–1.030)
Urobilinogen, UA: 0.2 mg/dL (ref 0.0–1.0)
pH: 7 (ref 5.0–8.0)

## 2013-07-25 LAB — RETICULOCYTES
RBC.: 3.28 MIL/uL — ABNORMAL LOW (ref 3.87–5.11)
Retic Count, Absolute: 49.2 10*3/uL (ref 19.0–186.0)
Retic Ct Pct: 1.5 % (ref 0.4–3.1)

## 2013-07-25 LAB — RENAL FUNCTION PANEL
Albumin: 1.7 g/dL — ABNORMAL LOW (ref 3.5–5.2)
BUN: 54 mg/dL — ABNORMAL HIGH (ref 6–23)
CO2: 16 mEq/L — ABNORMAL LOW (ref 19–32)
Calcium: 7.8 mg/dL — ABNORMAL LOW (ref 8.4–10.5)
Chloride: 107 mEq/L (ref 96–112)
Creatinine, Ser: 5.99 mg/dL — ABNORMAL HIGH (ref 0.50–1.10)
GFR calc Af Amer: 8 mL/min — ABNORMAL LOW (ref >90)
GFR calc non Af Amer: 7 mL/min — ABNORMAL LOW (ref >90)
Glucose, Bld: 177 mg/dL — ABNORMAL HIGH (ref 70–99)
Phosphorus: 4.8 mg/dL — ABNORMAL HIGH (ref 2.3–4.6)
Potassium: 4.9 mEq/L (ref 3.5–5.1)
Sodium: 135 mEq/L (ref 135–145)

## 2013-07-25 LAB — GLUCOSE, CAPILLARY
Glucose-Capillary: 177 mg/dL — ABNORMAL HIGH (ref 70–99)
Glucose-Capillary: 230 mg/dL — ABNORMAL HIGH (ref 70–99)
Glucose-Capillary: 274 mg/dL — ABNORMAL HIGH (ref 70–99)
Glucose-Capillary: 264 mg/dL — ABNORMAL HIGH (ref 70–99)

## 2013-07-25 LAB — URINE MICROSCOPIC-ADD ON

## 2013-07-25 LAB — PREPARE RBC (CROSSMATCH)

## 2013-07-25 LAB — CBC
HCT: 21.8 % — ABNORMAL LOW (ref 36.0–46.0)
Hemoglobin: 7.1 g/dL — ABNORMAL LOW (ref 12.0–15.0)
MCH: 23.8 pg — ABNORMAL LOW (ref 26.0–34.0)
MCHC: 32.6 g/dL (ref 30.0–36.0)
MCV: 73.2 fL — ABNORMAL LOW (ref 78.0–100.0)
Platelets: 194 10*3/uL (ref 150–400)
RBC: 2.98 MIL/uL — ABNORMAL LOW (ref 3.87–5.11)
RDW: 21.2 % — ABNORMAL HIGH (ref 11.5–15.5)
WBC: 11.3 10*3/uL — ABNORMAL HIGH (ref 4.0–10.5)

## 2013-07-25 LAB — TSH: TSH: 2.194 u[IU]/mL (ref 0.350–4.500)

## 2013-07-25 LAB — HEMOGLOBIN A1C
Hgb A1c MFr Bld: 10.4 % — ABNORMAL HIGH (ref <5.7)
Mean Plasma Glucose: 252 mg/dL — ABNORMAL HIGH (ref <117)

## 2013-07-25 LAB — LIPID PANEL
Cholesterol: 240 mg/dL — ABNORMAL HIGH (ref 0–200)
HDL: 36 mg/dL — ABNORMAL LOW (ref >39)
LDL Cholesterol: 131 mg/dL — ABNORMAL HIGH (ref 0–99)
Total CHOL/HDL Ratio: 6.7 RATIO
Triglycerides: 366 mg/dL — ABNORMAL HIGH (ref <150)
VLDL: 73 mg/dL — ABNORMAL HIGH (ref 0–40)

## 2013-07-25 LAB — ABO/RH: ABO/RH(D): A POS

## 2013-07-25 MED ORDER — PREDNISONE 5 MG PO TABS
5.0000 mg | ORAL_TABLET | Freq: Every day | ORAL | Status: DC
  Administered 2013-07-26: 5 mg via ORAL
  Filled 2013-07-25 (×2): qty 1

## 2013-07-25 MED ORDER — MYCOPHENOLATE SODIUM 180 MG PO TBEC
360.0000 mg | DELAYED_RELEASE_TABLET | Freq: Two times a day (BID) | ORAL | Status: DC
  Administered 2013-07-25 – 2013-07-26 (×2): 360 mg via ORAL
  Filled 2013-07-25 (×3): qty 2

## 2013-07-25 MED ORDER — DARBEPOETIN ALFA-POLYSORBATE 100 MCG/0.5ML IJ SOLN
100.0000 ug | INTRAMUSCULAR | Status: DC
  Administered 2013-07-25: 100 ug via SUBCUTANEOUS
  Filled 2013-07-25: qty 0.5

## 2013-07-25 MED ORDER — PREGABALIN 50 MG PO CAPS
75.0000 mg | ORAL_CAPSULE | Freq: Two times a day (BID) | ORAL | Status: DC
  Administered 2013-07-25 – 2013-07-26 (×2): 75 mg via ORAL
  Filled 2013-07-25 (×4): qty 1

## 2013-07-25 MED ORDER — FAMOTIDINE 20 MG PO TABS
20.0000 mg | ORAL_TABLET | Freq: Every day | ORAL | Status: DC
  Administered 2013-07-26: 20 mg via ORAL
  Filled 2013-07-25: qty 1
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
## 2013-07-25 NOTE — Plan of Care (Signed)
Problem: Food- and Nutrition-Related Knowledge Deficit (NB-1.1) Goal: Nutrition education Formal process to instruct or train a patient/client in a skill or to impart knowledge to help patients/clients voluntarily manage or modify food choices and eating behavior to maintain or improve health. Outcome: Completed/Met Date Met:  07/25/13  RD consulted for nutrition education regarding diabetes.   Note: Pt reports that her vision is bad and she has difficulty measuring out her insulin and thinks she would do better with an insulin pen. Recommend Diabetes Coordinator consult    Lab Results  Component Value Date    HGBA1C 10.4* 07/24/2013    RD provided "Carbohydrate Counting for People with Diabetes" handout from the Academy of [employer] Dietetics. Discussed different food groups and their effects on blood sugar, emphasizing carbohydrate-containing foods. Provided list of carbohydrates and recommended serving sizes of common foods.  Discussed importance of controlled and consistent carbohydrate intake throughout the day. Provided examples of ways to balance meals/snacks and encouraged intake of high-fiber, whole grain complex carbohydrates. Teach back method used.  Expect good compliance.  Body mass index is 41.34 kg/(m^2). Pt meets criteria for Obese Class III based on current BMI.  Current diet order is Carbohydrate Modified Medium, patient is consuming approximately 100% of meals at this time. Labs and medications reviewed. No further nutrition interventions warranted at this time. RD contact information provided. If additional nutrition issues arise, please re-consult RD.  Inda Coke MS, RD, LDN Pager: [phone] After-hours pager: [phone]

## 2013-07-25 NOTE — ED Provider Notes (Signed)
Medical screening examination/treatment/procedure(s) were conducted as a shared visit with non-physician practitioner(s) and myself.  I personally evaluated the patient during the encounter.  52yo F, c/o recurrent episodes of hypoglycemia that occurred this morning. Called EMS x2 today. Has hx DM, takes insulin. States her home CBG's have been running high so she gave herself extra insulin last night and did not eat today. VSS, A&O, resps easy, neuro non-focal. Glucose improved while in the ED but BUN/Cr significantly elevated from previous. Will admit.     Alfonzo Feller, DO 07/25/13 [phone]
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
## 2013-07-26 DIAGNOSIS — R112 Nausea with vomiting, unspecified: Secondary | ICD-10-CM

## 2013-07-26 DIAGNOSIS — J189 Pneumonia, unspecified organism: Secondary | ICD-10-CM

## 2013-07-26 DIAGNOSIS — R0602 Shortness of breath: Secondary | ICD-10-CM

## 2013-07-26 DIAGNOSIS — Z87898 Personal history of other specified conditions: Secondary | ICD-10-CM

## 2013-07-26 DIAGNOSIS — E871 Hypo-osmolality and hyponatremia: Secondary | ICD-10-CM

## 2013-07-26 LAB — URINE CULTURE
Colony Count: NO GROWTH
Culture: NO GROWTH

## 2013-07-26 LAB — BASIC METABOLIC PANEL
BUN: 56 mg/dL — ABNORMAL HIGH (ref 6–23)
CO2: 19 mEq/L (ref 19–32)
Calcium: 8.2 mg/dL — ABNORMAL LOW (ref 8.4–10.5)
Chloride: 106 mEq/L (ref 96–112)
Creatinine, Ser: 5.88 mg/dL — ABNORMAL HIGH (ref 0.50–1.10)
GFR calc Af Amer: 9 mL/min — ABNORMAL LOW (ref >90)
GFR calc non Af Amer: 7 mL/min — ABNORMAL LOW (ref >90)
Glucose, Bld: 270 mg/dL — ABNORMAL HIGH (ref 70–99)
Potassium: 4.7 mEq/L (ref 3.5–5.1)
Sodium: 137 mEq/L (ref 135–145)

## 2013-07-26 LAB — FERRITIN
Ferritin: 34 ng/mL (ref 10–291)
Ferritin: 34 ng/mL (ref 10–291)

## 2013-07-26 LAB — GLUCOSE, CAPILLARY
Glucose-Capillary: 261 mg/dL — ABNORMAL HIGH (ref 70–99)
Glucose-Capillary: 305 mg/dL — ABNORMAL HIGH (ref 70–99)

## 2013-07-26 LAB — IRON AND TIBC
Iron: 25 ug/dL — ABNORMAL LOW (ref 42–135)
Iron: 127 ug/dL (ref 42–135)
Saturation Ratios: 12 % — ABNORMAL LOW (ref 20–55)
Saturation Ratios: 61 % — ABNORMAL HIGH (ref 20–55)
TIBC: 207 ug/dL — ABNORMAL LOW (ref 250–470)
TIBC: 208 ug/dL — ABNORMAL LOW (ref 250–470)
UIBC: 182 ug/dL (ref 125–400)
UIBC: 81 ug/dL — ABNORMAL LOW (ref 125–400)

## 2013-07-26 LAB — CBC
HCT: 26.8 % — ABNORMAL LOW (ref 36.0–46.0)
Hemoglobin: 8.6 g/dL — ABNORMAL LOW (ref 12.0–15.0)
MCH: 24 pg — ABNORMAL LOW (ref 26.0–34.0)
MCHC: 32.1 g/dL (ref 30.0–36.0)
MCV: 74.9 fL — ABNORMAL LOW (ref 78.0–100.0)
Platelets: 187 10*3/uL (ref 150–400)
RBC: 3.58 MIL/uL — ABNORMAL LOW (ref 3.87–5.11)
RDW: 20.7 % — ABNORMAL HIGH (ref 11.5–15.5)
WBC: 9.3 10*3/uL (ref 4.0–10.5)

## 2013-07-26 LAB — TYPE AND SCREEN
ABO/RH(D): A POS
Antibody Screen: NEGATIVE
Unit division: 0

## 2013-07-26 LAB — TRANSFERRIN: Transferrin: 175 mg/dL — ABNORMAL LOW (ref 200–360)

## 2013-07-26 MED ORDER — PREDNISONE 5 MG PO TABS: 5.0000 mg | ORAL_TABLET | Freq: Every day | ORAL | Status: DC

## 2013-07-26 MED ORDER — INSULIN ASPART 100 UNIT/ML FLEXPEN: 10.0000 [IU] | PEN_INJECTOR | Freq: Three times a day (TID) | SUBCUTANEOUS | Status: DC

## 2013-07-26 MED ORDER — LIVING WELL WITH DIABETES BOOK
Freq: Once | Status: AC
  Administered 2013-07-26: 13:00:00
  Filled 2013-07-26: qty 1

## 2013-07-26 MED ORDER — INSULIN PEN STARTER KIT
1.0000 | Freq: Once | Status: AC
  Administered 2013-07-26: 1
  Filled 2013-07-26: qty 1

## 2013-07-26 MED ORDER — METHYLPREDNISOLONE (PAK) 4 MG PO TABS: ORAL_TABLET | ORAL | Status: DC

## 2013-07-26 MED ORDER — FUROSEMIDE 40 MG PO TABS: 20.0000 mg | ORAL_TABLET | Freq: Two times a day (BID) | ORAL | Status: DC

## 2013-07-26 MED ORDER — INSULIN PEN STARTER KIT: 1.0000 | Freq: Once | Status: DC

## 2013-07-26 MED ORDER — INSULIN GLARGINE 100 UNIT/ML SOLOSTAR PEN: 25.0000 [IU] | PEN_INJECTOR | Freq: Every morning | SUBCUTANEOUS | Status: DC
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
## 2013-07-26 NOTE — Progress Notes (Signed)
Inpatient Diabetes Program Recommendations  AACE/ADA: New Consensus Statement on Inpatient Glycemic Control (2013)  Target Ranges:  Prepandial:   less than 140 mg/dL      Peak postprandial:   less than 180 mg/dL (1-2 hours)      Critically ill patients:  140 - 180 mg/dL   Reason for Visit: Insulin Pen Administration  Pt interested in using insulin pen at home instead of syringe.  States her insurance will cover pens.  Demonstrated use of pen and pt was able to return demonstration without difficulty.  Discussed importance of checking blood sugars 4 x/day and taking meter to MD visit for review and adjustments to her insulin regimen.  Also discussed hypoglycemia s/s and treatment and pt voices understanding.  Instructed pt to rotate sights for insulin injections and encouraged to call MD to make f/u appt next week, as I'm quite sure adjustments will needed.  Pt willing to go to OP Diabetes Education classes and will be starting a Silver Sneakers program for exercise.  Pt appears motivated to make lifestyle changes to improve glycemic control.  Answered questions and gave handout for insulin pen instructions.  Recommend Lantus 30 units QHS and Novolog sensitive S/S with 10 units tidwc for meal coverage insulin. Discussed with Dr. Ree Kida and RN.  Thank you. Lorenda Peck, RD, LDN, CDE Inpatient Diabetes Coordinator [phone]
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
## 2013-07-26 NOTE — Progress Notes (Signed)
Patient discharge teaching given, including activity, diet, follow-up appoints, and medications. Patient verbalized understanding of all discharge instructions. IV access was d/c'd. Vitals are stable. Skin is intact except as charted in most recent assessments. Pt to be escorted out by NT, to be driven home by family.  Jeremy Ditullio, MBA, BS, RN 

## 2013-12-22 ENCOUNTER — Emergency Department (HOSPITAL_COMMUNITY): Payer: 59

## 2013-12-22 ENCOUNTER — Emergency Department (HOSPITAL_COMMUNITY)
Admission: EM | Admit: 2013-12-22 | Discharge: 2013-12-22 | Disposition: A | Discharge status: Home / Self Care | Payer: 59 | Attending: Emergency Medicine | Admitting: Emergency Medicine

## 2013-12-22 ENCOUNTER — Encounter (HOSPITAL_COMMUNITY): Payer: Self-pay | Admitting: Emergency Medicine

## 2013-12-22 DIAGNOSIS — E785 Hyperlipidemia, unspecified: Secondary | ICD-10-CM | POA: Insufficient documentation

## 2013-12-22 DIAGNOSIS — R110 Nausea: Secondary | ICD-10-CM | POA: Insufficient documentation

## 2013-12-22 DIAGNOSIS — I120 Hypertensive chronic kidney disease with stage 5 chronic kidney disease or end stage renal disease: Secondary | ICD-10-CM | POA: Insufficient documentation

## 2013-12-22 DIAGNOSIS — R109 Unspecified abdominal pain: Secondary | ICD-10-CM

## 2013-12-22 DIAGNOSIS — N186 End stage renal disease: Secondary | ICD-10-CM | POA: Insufficient documentation

## 2013-12-22 DIAGNOSIS — E1142 Type 2 diabetes mellitus with diabetic polyneuropathy: Secondary | ICD-10-CM | POA: Insufficient documentation

## 2013-12-22 DIAGNOSIS — E039 Hypothyroidism, unspecified: Secondary | ICD-10-CM | POA: Insufficient documentation

## 2013-12-22 DIAGNOSIS — Z992 Dependence on renal dialysis: Secondary | ICD-10-CM | POA: Insufficient documentation

## 2013-12-22 DIAGNOSIS — K219 Gastro-esophageal reflux disease without esophagitis: Secondary | ICD-10-CM | POA: Insufficient documentation

## 2013-12-22 DIAGNOSIS — E1149 Type 2 diabetes mellitus with other diabetic neurological complication: Secondary | ICD-10-CM | POA: Insufficient documentation

## 2013-12-22 DIAGNOSIS — R000 Tachycardia, unspecified: Secondary | ICD-10-CM | POA: Insufficient documentation

## 2013-12-22 DIAGNOSIS — Z792 Long term (current) use of antibiotics: Secondary | ICD-10-CM | POA: Insufficient documentation

## 2013-12-22 DIAGNOSIS — Z9851 Tubal ligation status: Secondary | ICD-10-CM | POA: Insufficient documentation

## 2013-12-22 DIAGNOSIS — Z794 Long term (current) use of insulin: Secondary | ICD-10-CM | POA: Insufficient documentation

## 2013-12-22 DIAGNOSIS — Z8619 Personal history of other infectious and parasitic diseases: Secondary | ICD-10-CM | POA: Insufficient documentation

## 2013-12-22 DIAGNOSIS — Z862 Personal history of diseases of the blood and blood-forming organs and certain disorders involving the immune mechanism: Secondary | ICD-10-CM | POA: Insufficient documentation

## 2013-12-22 DIAGNOSIS — Z9889 Other specified postprocedural states: Secondary | ICD-10-CM | POA: Insufficient documentation

## 2013-12-22 DIAGNOSIS — Z79899 Other long term (current) drug therapy: Secondary | ICD-10-CM | POA: Insufficient documentation

## 2013-12-22 DIAGNOSIS — G4730 Sleep apnea, unspecified: Secondary | ICD-10-CM | POA: Insufficient documentation

## 2013-12-22 DIAGNOSIS — Z3202 Encounter for pregnancy test, result negative: Secondary | ICD-10-CM | POA: Insufficient documentation

## 2013-12-22 DIAGNOSIS — G43909 Migraine, unspecified, not intractable, without status migrainosus: Secondary | ICD-10-CM | POA: Insufficient documentation

## 2013-12-22 DIAGNOSIS — Z7982 Long term (current) use of aspirin: Secondary | ICD-10-CM | POA: Insufficient documentation

## 2013-12-22 DIAGNOSIS — R319 Hematuria, unspecified: Secondary | ICD-10-CM | POA: Insufficient documentation

## 2013-12-22 DIAGNOSIS — Z8701 Personal history of pneumonia (recurrent): Secondary | ICD-10-CM | POA: Insufficient documentation

## 2013-12-22 DIAGNOSIS — Z940 Kidney transplant status: Secondary | ICD-10-CM | POA: Insufficient documentation

## 2013-12-22 DIAGNOSIS — IMO0002 Reserved for concepts with insufficient information to code with codable children: Secondary | ICD-10-CM | POA: Insufficient documentation

## 2013-12-22 DIAGNOSIS — E669 Obesity, unspecified: Secondary | ICD-10-CM | POA: Insufficient documentation

## 2013-12-22 DIAGNOSIS — Z9089 Acquired absence of other organs: Secondary | ICD-10-CM | POA: Insufficient documentation

## 2013-12-22 DIAGNOSIS — M109 Gout, unspecified: Secondary | ICD-10-CM | POA: Insufficient documentation

## 2013-12-22 LAB — CBG MONITORING, ED: Glucose-Capillary: 431 mg/dL — ABNORMAL HIGH (ref 70–99)

## 2013-12-22 LAB — CBC WITH DIFFERENTIAL/PLATELET
Basophils Absolute: 0 10*3/uL (ref 0.0–0.1)
Basophils Relative: 0 % (ref 0–1)
Eosinophils Absolute: 0.1 10*3/uL (ref 0.0–0.7)
Eosinophils Relative: 1 % (ref 0–5)
HCT: 27.6 % — ABNORMAL LOW (ref 36.0–46.0)
Hemoglobin: 9.8 g/dL — ABNORMAL LOW (ref 12.0–15.0)
Lymphocytes Relative: 6 % — ABNORMAL LOW (ref 12–46)
Lymphs Abs: 1 10*3/uL (ref 0.7–4.0)
MCH: 29 pg (ref 26.0–34.0)
MCHC: 35.5 g/dL (ref 30.0–36.0)
MCV: 81.7 fL (ref 78.0–100.0)
Monocytes Absolute: 0.1 10*3/uL (ref 0.1–1.0)
Monocytes Relative: 1 % — ABNORMAL LOW (ref 3–12)
Neutro Abs: 14.5 10*3/uL — ABNORMAL HIGH (ref 1.7–7.7)
Neutrophils Relative %: 92 % — ABNORMAL HIGH (ref 43–77)
Platelets: 334 10*3/uL (ref 150–400)
RBC: 3.38 MIL/uL — ABNORMAL LOW (ref 3.87–5.11)
RDW: 13.6 % (ref 11.5–15.5)
WBC: 15.7 10*3/uL — ABNORMAL HIGH (ref 4.0–10.5)

## 2013-12-22 LAB — URINALYSIS, ROUTINE W REFLEX MICROSCOPIC
Bilirubin Urine: NEGATIVE
Glucose, UA: >1000 mg/dL — AB
Ketones, ur: NEGATIVE mg/dL
Leukocytes, UA: NEGATIVE
Nitrite: NEGATIVE
Protein, ur: >300 mg/dL — AB
Specific Gravity, Urine: 1.02 (ref 1.005–1.030)
Urobilinogen, UA: 0.2 mg/dL (ref 0.0–1.0)
pH: 7.5 (ref 5.0–8.0)

## 2013-12-22 LAB — COMPREHENSIVE METABOLIC PANEL
ALT: 6 U/L (ref 0–35)
AST: 8 U/L (ref 0–37)
Albumin: 2.4 g/dL — ABNORMAL LOW (ref 3.5–5.2)
Alkaline Phosphatase: 105 U/L (ref 39–117)
BUN: 23 mg/dL (ref 6–23)
CO2: 27 mEq/L (ref 19–32)
Calcium: 9.2 mg/dL (ref 8.4–10.5)
Chloride: 89 mEq/L — ABNORMAL LOW (ref 96–112)
Creatinine, Ser: 6.58 mg/dL — ABNORMAL HIGH (ref 0.50–1.10)
GFR calc Af Amer: 8 mL/min — ABNORMAL LOW (ref >90)
GFR calc non Af Amer: 6 mL/min — ABNORMAL LOW (ref >90)
Glucose, Bld: 503 mg/dL — ABNORMAL HIGH (ref 70–99)
Potassium: 4.3 mEq/L (ref 3.7–5.3)
Sodium: 130 mEq/L — ABNORMAL LOW (ref 137–147)
Total Bilirubin: <0.2 mg/dL — ABNORMAL LOW (ref 0.3–1.2)
Total Protein: 7.5 g/dL (ref 6.0–8.3)

## 2013-12-22 LAB — URINE MICROSCOPIC-ADD ON

## 2013-12-22 LAB — LIPASE, BLOOD: Lipase: 36 U/L (ref 11–59)

## 2013-12-22 LAB — PREGNANCY, URINE: Preg Test, Ur: NEGATIVE

## 2013-12-22 MED ORDER — OXYCODONE-ACETAMINOPHEN 5-325 MG PO TABS: 1.0000 | ORAL_TABLET | ORAL | Status: DC | PRN

## 2013-12-22 MED ORDER — SODIUM CHLORIDE 0.9 % IV BOLUS (SEPSIS)
500.0000 mL | Freq: Once | INTRAVENOUS | Status: AC
  Administered 2013-12-22: 500 mL via INTRAVENOUS

## 2013-12-22 MED ORDER — INSULIN ASPART 100 UNIT/ML ~~LOC~~ SOLN
10.0000 [IU] | Freq: Once | SUBCUTANEOUS | Status: AC
  Administered 2013-12-22: 10 [IU] via SUBCUTANEOUS
  Filled 2013-12-22: qty 1

## 2013-12-22 MED ORDER — DEXTROSE 5 % IV SOLN
1.0000 g | Freq: Once | INTRAVENOUS | Status: AC
  Administered 2013-12-22: 1 g via INTRAVENOUS
  Filled 2013-12-22: qty 10

## 2013-12-22 MED ORDER — IOHEXOL 300 MG/ML  SOLN
50.0000 mL | Freq: Once | INTRAMUSCULAR | Status: AC | PRN
  Administered 2013-12-22: 25 mL via ORAL

## 2013-12-22 MED ORDER — ONDANSETRON HCL 4 MG/2ML IJ SOLN
4.0000 mg | Freq: Once | INTRAMUSCULAR | Status: AC
  Administered 2013-12-22: 4 mg via INTRAVENOUS
  Filled 2013-12-22: qty 2

## 2013-12-22 MED ORDER — CEPHALEXIN 500 MG PO CAPS: 500.0000 mg | ORAL_CAPSULE | Freq: Two times a day (BID) | ORAL | Status: DC

## 2013-12-22 MED ORDER — MORPHINE SULFATE 4 MG/ML IJ SOLN
4.0000 mg | Freq: Once | INTRAMUSCULAR | Status: AC
  Administered 2013-12-22: 4 mg via INTRAVENOUS
  Filled 2013-12-22: qty 1

## 2013-12-22 MED ORDER — IOHEXOL 300 MG/ML  SOLN
100.0000 mL | Freq: Once | INTRAMUSCULAR | Status: AC | PRN
  Administered 2013-12-22: 100 mL via INTRAVENOUS

## 2013-12-22 MED ORDER — CEPHALEXIN 500 MG PO CAPS: 500.0000 mg | ORAL_CAPSULE | Freq: Every day | ORAL | Status: DC

## 2013-12-22 NOTE — ED Provider Notes (Signed)
CSN: [id number]     Arrival date & time 12/22/13  1611 History   First MD Initiated Contact with Patient 12/22/13 1640     Chief Complaint  Patient presents with  . 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 She has a history of her renal transplant was done at [facility] in August of 2013. This transplant failed in October of 2014. She's been back on dialysis since that time. She's been followed by Dr. Detterding here in [place] for her dialysis. She sees Dr. Lissa Merlin with the transplant team at [facility]. She states this past Monday which was at this 6 days ago, she noted a sudden pain in her right midabdomen in the area where renal transplant was done. She's had constant worsening pain since that time. She states it got worse today and this morning noticed blood in her urine. She normally urinates once a day in the morning and has not had blood in her urine before. She denies any back pain. There's no vomiting or fevers. She does dialysis Monday Wednesday Friday and completed her dialysis on Friday.  Patient is a 53 y.o. female presenting with abdominal pain.  Abdominal Pain Associated symptoms: hematuria and nausea   Associated symptoms: no chest pain, no chills, no cough, no diarrhea, no fatigue, no fever, no shortness of breath and no vomiting     Past Medical History  Diagnosis Date  . Hyperlipidemia   . Unspecified essential hypertension   . Shingles   . Obesity   . Hypothyroidism   . Diabetes mellitus type II   . Nonspecific abnormal unspecified cardiovascular function study   . Foot pain   . Renal transplant disorder   . Peripheral vascular disease   . PAD (peripheral artery disease)   . Diabetic peripheral neuropathy   . Dyspnea on exertion   . Anemia   . Gout   . Pneumonia 09/2012  . Sleep apnea     "getting  ready to get tested again cause dr says I have it" (07/24/2013)  . GERD (gastroesophageal reflux disease)   . Migraines     "q 3 months" (07/24/2013)  . Arthritis     "hands" (07/24/2013)  . Chronic kidney disease, stage IV (severe)     "never went on dialysis" (07/24/2013)   Past Surgical History  Procedure Laterality Date  . Av fistula placement, radiocephalic Right [id number]    "upper arm" (07/24/2013)  . Cesarean section  1986; 1987  . Endometrial cryoablation  1980's  . Colonoscopy    . Av fistula placement Right 2000    "lower arm" (07/24/2013)  . Transplantation renal  2013    "right" (09/04/2012)  . Cholecystectomy  2011  . Varicose vein surgery  1997    "RLE; stripped" (09/04/2012)  . Video bronchoscopy  09/07/2012    Procedure: VIDEO BRONCHOSCOPY WITHOUT FLUORO;  Surgeon: Chesley Mires, MD;  Location: [facility] ENDOSCOPY;  Service: Cardiopulmonary;  Laterality: Bilateral;  . Tubal ligation  1987  . Cataract extraction w/ intraocular lens  implant, bilateral Bilateral 2011  . Intrauterine device (iud) insertion  2011   Family History  Problem Relation Age of Onset  . Colon cancer Father     deceased at age 77   History  Substance Use Topics  . Smoking status: Never Smoker   . Smokeless tobacco: Never Used  . Alcohol Use: No   OB  History   Grav Para Term Preterm Abortions TAB SAB Ect Mult Living                 Review of Systems  Constitutional: Negative for fever, chills, diaphoresis and fatigue.  HENT: Negative for congestion, rhinorrhea and sneezing.   Eyes: Negative.   Respiratory: Negative for cough, chest tightness and shortness of breath.   Cardiovascular: Negative for chest pain and leg swelling.  Gastrointestinal: Positive for nausea and abdominal pain. Negative for vomiting, diarrhea and blood in stool.  Genitourinary: Positive for hematuria. Negative for frequency, flank pain and difficulty urinating.  Musculoskeletal: Negative for arthralgias and back pain.   Skin: Negative for rash.  Neurological: Negative for dizziness, speech difficulty, weakness, numbness and headaches.      Allergies  Hydrocodone-acetaminophen and Propoxyphene n-acetaminophen  Home Medications   Current Outpatient Rx  Name  Route  Sig  Dispense  Refill  . allopurinol (ZYLOPRIM) 100 MG tablet   Oral   Take 100 mg by mouth daily.         [name] Kitchen amLODipine (NORVASC) 10 MG tablet   Oral   Take 10 mg by mouth daily.         [name] Kitchen aspirin EC 81 MG tablet   Oral   Take 81 mg by mouth daily.         . Aspirin-Acetaminophen-Caffeine (EXCEDRIN EXTRA STRENGTH PO)   Oral   Take 2 tablets by mouth daily as needed (pain).         [name] Kitchen ergocalciferol (VITAMIN D2) 50000 UNITS capsule   Oral   Take 50,000 Units by mouth every 30 (thirty) days. On the 1st of each month         . insulin aspart (NOVOLOG FLEXPEN) 100 UNIT/ML FlexPen   Subcutaneous   Inject 28 Units into the skin 2 (two) times daily. Inject daily at noon and at 5pm         . Insulin Glargine (LANTUS SOLOSTAR) 100 UNIT/ML Solostar Pen   Subcutaneous   Inject 32 Units into the skin 2 (two) times daily. Inject at 9am and 9pm         . levothyroxine (SYNTHROID) 50 MCG tablet   Oral   Take 50 mcg by mouth daily.          . magnesium oxide (MAG-OX) 400 MG tablet   Oral   Take 400 mg by mouth 3 (three) times daily. Morning, 5pm and 9pm         . mycophenolate (MYFORTIC) 180 MG EC tablet   Oral   Take 180 mg by mouth daily.          [name] Kitchen omeprazole (PRILOSEC OTC) 20 MG tablet   Oral   Take 20 mg by mouth daily.          . predniSONE (DELTASONE) 5 MG tablet   Oral   Take 5 mg by mouth daily.          . pregabalin (LYRICA) 75 MG capsule   Oral   Take 225 mg by mouth at bedtime.          . sennosides-docusate sodium (SENOKOT-S) 8.6-50 MG tablet   Oral   Take 2 tablets by mouth daily.         . sevelamer carbonate (RENVELA) 800 MG tablet   Oral   Take 1,600 mg by mouth 3 (three)  times daily with meals.         . tacrolimus (PROGRAF) 1  MG capsule   Oral   Take 2 mg by mouth 2 (two) times daily with a meal.          . cephALEXin (KEFLEX) 500 MG capsule   Oral   Take 1 capsule (500 mg total) by mouth 2 (two) times daily.   20 capsule   0   . oxyCODONE-acetaminophen (PERCOCET) 5-325 MG per tablet   Oral   Take 1 tablet by mouth every 4 (four) hours as needed.   20 tablet   0    BP 185/89  Pulse 88  Temp(Src) 98.4 F (36.9 C) (Oral)  Resp 20  Ht 5\' 10"  (1.778 m)  Wt 260 lb (117.935 kg)  BMI 37.31 kg/m2  SpO2 96% Physical Exam  Constitutional: She is oriented to person, place, and time. She appears well-developed and well-nourished.  HENT:  Head: Normocephalic and atraumatic.  Eyes: Pupils are equal, round, and reactive to light.  Neck: Normal range of motion. Neck supple.  Cardiovascular: Regular rhythm and normal heart sounds.  Tachycardia present.   Pulmonary/Chest: Effort normal and breath sounds normal. No respiratory distress. She has no wheezes. She has no rales. She exhibits no tenderness.  Abdominal: Soft. Bowel sounds are normal. There is tenderness (Moderate tenderness to the right mid lower abdomen. Her surgical scars from her transplant surgery. There is new bruising on the abdomen or back.). There is no rebound and no guarding.  Musculoskeletal: Normal range of motion. She exhibits no edema.  Lymphadenopathy:    She has no cervical adenopathy.  Neurological: She is alert and oriented to person, place, and time.  Skin: Skin is warm and dry. No rash noted.  Psychiatric: She has a normal mood and affect.    ED Course  Procedures (including critical care time) Labs Review Results for orders placed during the hospital encounter of 12/22/13  CBC WITH DIFFERENTIAL      Result Value Ref Range   WBC 15.7 (*) 4.0 - 10.5 K/uL   RBC 3.38 (*) 3.87 - 5.11 MIL/uL   Hemoglobin 9.8 (*) 12.0 - 15.0 g/dL   HCT 27.6 (*) 36.0 - 46.0 %   MCV 81.7   78.0 - 100.0 fL   MCH 29.0  26.0 - 34.0 pg   MCHC 35.5  30.0 - 36.0 g/dL   RDW 13.6  11.5 - 15.5 %   Platelets 334  150 - 400 K/uL   Neutrophils Relative % 92 (*) 43 - 77 %   Neutro Abs 14.5 (*) 1.7 - 7.7 K/uL   Lymphocytes Relative 6 (*) 12 - 46 %   Lymphs Abs 1.0  0.7 - 4.0 K/uL   Monocytes Relative 1 (*) 3 - 12 %   Monocytes Absolute 0.1  0.1 - 1.0 K/uL   Eosinophils Relative 1  0 - 5 %   Eosinophils Absolute 0.1  0.0 - 0.7 K/uL   Basophils Relative 0  0 - 1 %   Basophils Absolute 0.0  0.0 - 0.1 K/uL  COMPREHENSIVE METABOLIC PANEL      Result Value Ref Range   Sodium 130 (*) 137 - 147 mEq/L   Potassium 4.3  3.7 - 5.3 mEq/L   Chloride 89 (*) 96 - 112 mEq/L   CO2 27  19 - 32 mEq/L   Glucose, Bld 503 (*) 70 - 99 mg/dL   BUN 23  6 - 23 mg/dL   Creatinine, Ser 6.58 (*) 0.50 - 1.10 mg/dL   Calcium 9.2  8.4 -  10.5 mg/dL   Total Protein 7.5  6.0 - 8.3 g/dL   Albumin 2.4 (*) 3.5 - 5.2 g/dL   AST 8  0 - 37 U/L   ALT 6  0 - 35 U/L   Alkaline Phosphatase 105  39 - 117 U/L   Total Bilirubin <0.2 (*) 0.3 - 1.2 mg/dL   GFR calc non Af Amer 6 (*) >90 mL/min   GFR calc Af Amer 8 (*) >90 mL/min  LIPASE, BLOOD      Result Value Ref Range   Lipase 36  11 - 59 U/L  URINALYSIS, ROUTINE W REFLEX MICROSCOPIC      Result Value Ref Range   Color, Urine YELLOW  YELLOW   APPearance CLEAR  CLEAR   Specific Gravity, Urine 1.020  1.005 - 1.030   pH 7.5  5.0 - 8.0   Glucose, UA >1000 (*) NEGATIVE mg/dL   Hgb urine dipstick TRACE (*) NEGATIVE   Bilirubin Urine NEGATIVE  NEGATIVE   Ketones, ur NEGATIVE  NEGATIVE mg/dL   Protein, ur >300 (*) NEGATIVE mg/dL   Urobilinogen, UA 0.2  0.0 - 1.0 mg/dL   Nitrite NEGATIVE  NEGATIVE   Leukocytes, UA NEGATIVE  NEGATIVE  PREGNANCY, URINE      Result Value Ref Range   Preg Test, Ur NEGATIVE  NEGATIVE  URINE MICROSCOPIC-ADD ON      Result Value Ref Range   Squamous Epithelial / LPF RARE  RARE   RBC / HPF 0-2  <3 RBC/hpf   Bacteria, UA RARE  RARE  CBG  MONITORING, ED      Result Value Ref Range   Glucose-Capillary 431 (*) 70 - 99 mg/dL   Comment 1 Notify RN     Ct Abdomen Pelvis W Contrast  12/22/2013   CLINICAL DATA:  53 year old female with right-sided abdominal and pelvic pain. History of failed renal transplant.  EXAM: CT ABDOMEN AND PELVIS WITH CONTRAST  TECHNIQUE: Multidetector CT imaging of the abdomen and pelvis was performed using the standard protocol following bolus administration of intravenous contrast.  CONTRAST:  157mL OMNIPAQUE IOHEXOL 300 MG/ML  SOLN  COMPARISON:  01/08/2013 CT  FINDINGS: There may be mild hepatic contour nodularity and cirrhosis is not excluded.  Splenomegaly is present.  The adrenal glands and pancreas are unremarkable.  Mild bilateral renal cortical thinning is noted.  A small probable left renal cyst is noted.  A right pelvic renal transplant is noted with fullness of the intrarenal collecting system. Inflammation adjacent to the transplant kidney appears slightly increased since 2014.  There is no evidence of free fluid, enlarged lymph nodes, biliary dilation or abdominal aortic aneurysm.  There is no evidence of bowel obstruction, pneumoperitoneum or focal abscess.  The appendix is normal.  The patient is status post cholecystectomy.  IUD is noted.  No acute or suspicious bony abnormalities are present.  The bladder is within normal limits.  IMPRESSION: Right pelvic renal transplant with slightly increased nonspecific perinephric inflammation since 2014.  No other evidence of acute abnormality.  Possible cirrhosis.  Splenomegaly.   Electronically Signed   By: Hassan Rowan M.D.   On: 12/22/2013 20:05     Imaging Review Ct Abdomen Pelvis W Contrast  12/22/2013   CLINICAL DATA:  53 year old female with right-sided abdominal and pelvic pain. History of failed renal transplant.  EXAM: CT ABDOMEN AND PELVIS WITH CONTRAST  TECHNIQUE: Multidetector CT imaging of the abdomen and pelvis was performed using the standard  protocol following bolus  administration of intravenous contrast.  CONTRAST:  119mL OMNIPAQUE IOHEXOL 300 MG/ML  SOLN  COMPARISON:  01/08/2013 CT  FINDINGS: There may be mild hepatic contour nodularity and cirrhosis is not excluded.  Splenomegaly is present.  The adrenal glands and pancreas are unremarkable.  Mild bilateral renal cortical thinning is noted.  A small probable left renal cyst is noted.  A right pelvic renal transplant is noted with fullness of the intrarenal collecting system. Inflammation adjacent to the transplant kidney appears slightly increased since 2014.  There is no evidence of free fluid, enlarged lymph nodes, biliary dilation or abdominal aortic aneurysm.  There is no evidence of bowel obstruction, pneumoperitoneum or focal abscess.  The appendix is normal.  The patient is status post cholecystectomy.  IUD is noted.  No acute or suspicious bony abnormalities are present.  The bladder is within normal limits.  IMPRESSION: Right pelvic renal transplant with slightly increased nonspecific perinephric inflammation since 2014.  No other evidence of acute abnormality.  Possible cirrhosis.  Splenomegaly.   Electronically Signed   By: Hassan Rowan M.D.   On: 12/22/2013 20:05     EKG Interpretation None      MDM   Final diagnoses:  Flank pain    Patient is a diabetic and status post failed renal transplant. She presents with right flank pain. On CT there some inflammatory changes around the transplanted kidney. I spoke with the transplant Dr. on call at [facility] where she goes. He states that this could represent either infection or worsening rejection. He recommended starting patient on antibiotics and their transplant coordinator will call the patient tomorrow to check on her and to arrange for her to get in to see the transplant team for possible removal of the grafted kidney. She's currently well appearing and afebrile. She's smiling  in minimal distress. So that she can  be treated as an outpatient. She was given dose Rocephin in the ED. Her urine was sent for culture. The urinalysis does not appear to be infected. However given her risk factors I did start her on Keflex. She just recently finished a course of Cipro. Her blood sugars have been markedly elevated here in the ED. It appears from the with the transplant doctor that her blood sugars are normally not well-controlled. She also states that she took an increased dose of steroids this morning. She was given 2 doses of subcutaneous insulin here in the ED. She was given strict return precautions. She was advised to definitely return if she has any fever worsening pain. She was started on Keflex and given a renal dose of 500 mg twice a day given her calculated creatinine clearance of 18.    Malvin Johns, MD 12/22/13 [phone]

## 2013-12-22 NOTE — ED Notes (Signed)
Pt c/o right lower quadrant pain onset Last Monday. Pt reports decrease appetite. Pt had a kidney transplant but it failed in Oct 2014. Pt is on dialysis MWF. Last dialysis was Friday and she completed the treatment.

## 2013-12-22 NOTE — Discharge Instructions (Signed)

## 2013-12-23 LAB — URINE CULTURE
Colony Count: NO GROWTH
Culture: NO GROWTH

## 2014-04-15 IMAGING — MG MM DIGITAL DIAGNOSTIC UNILAT*R*
3 series · 3 of 3 positions shown · non-contrast
Comparison: 08/06/2012 and 10/22/2009

CLINICAL DATA: Possible mass right breast identified on recent
screening mammogram.

DIGITAL DIAGNOSTIC RIGHT MAMMOGRAM WITHOUT CAD

[R CC (1 of 2)]
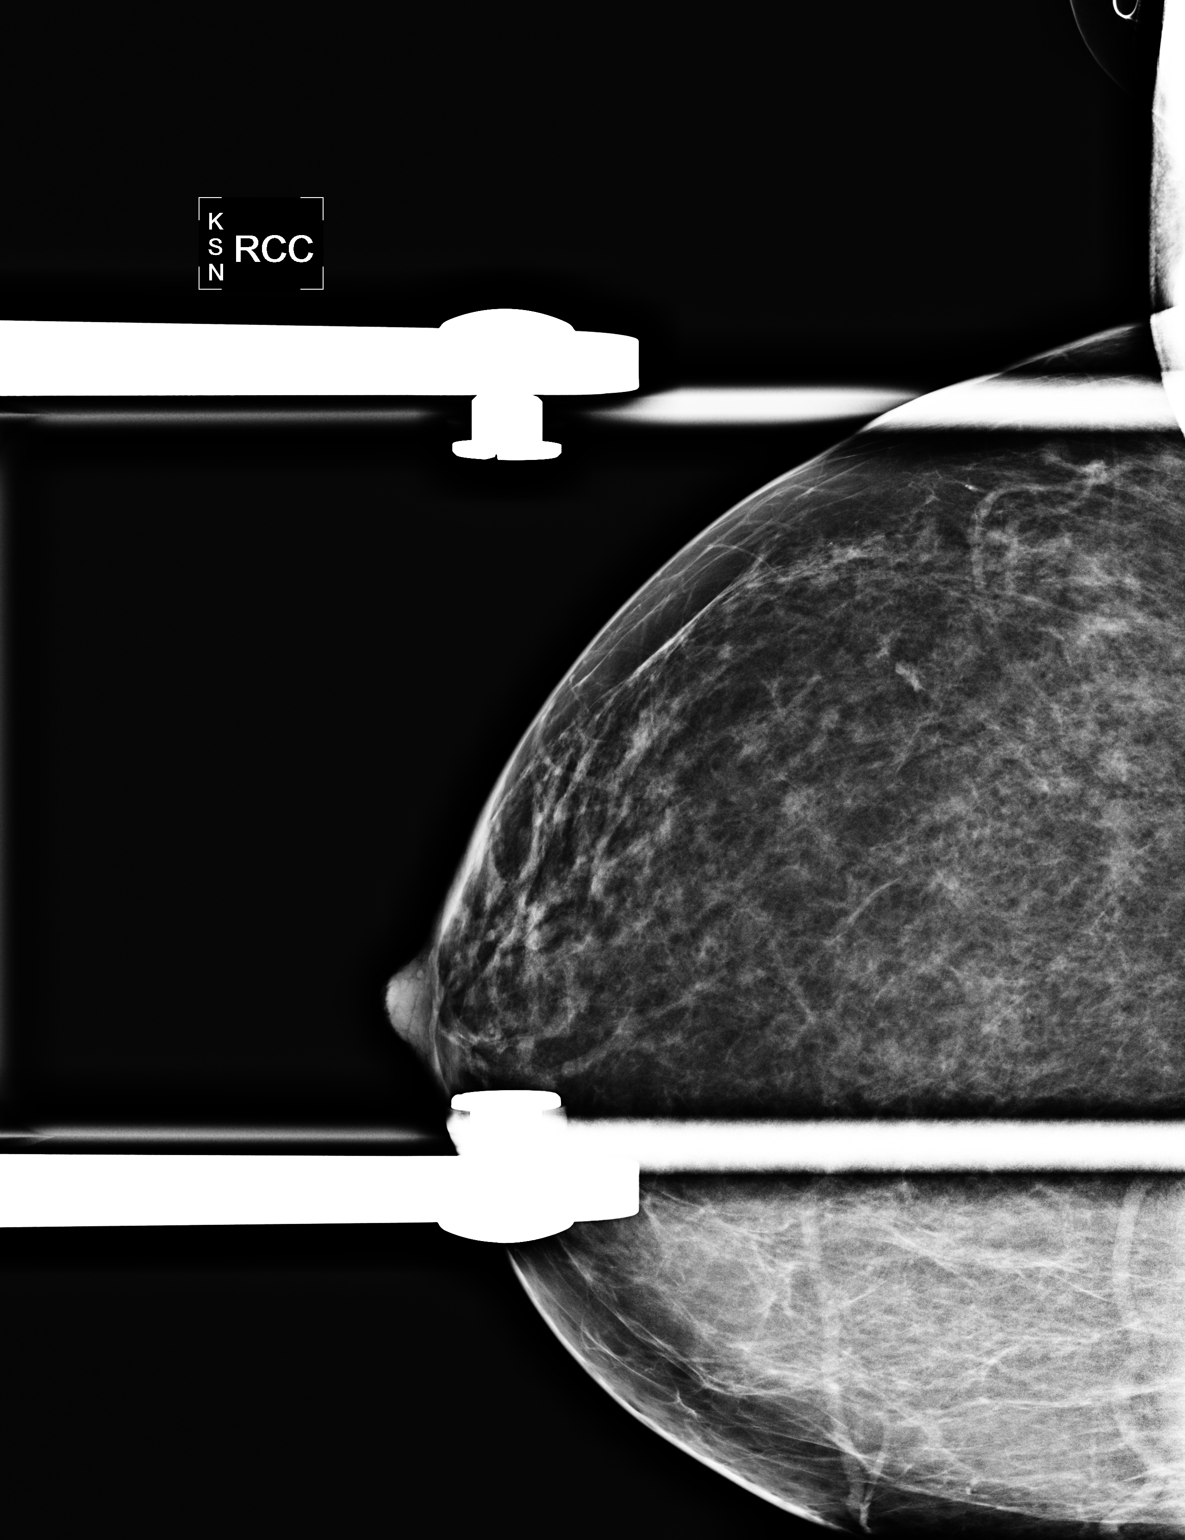

[R MLO]
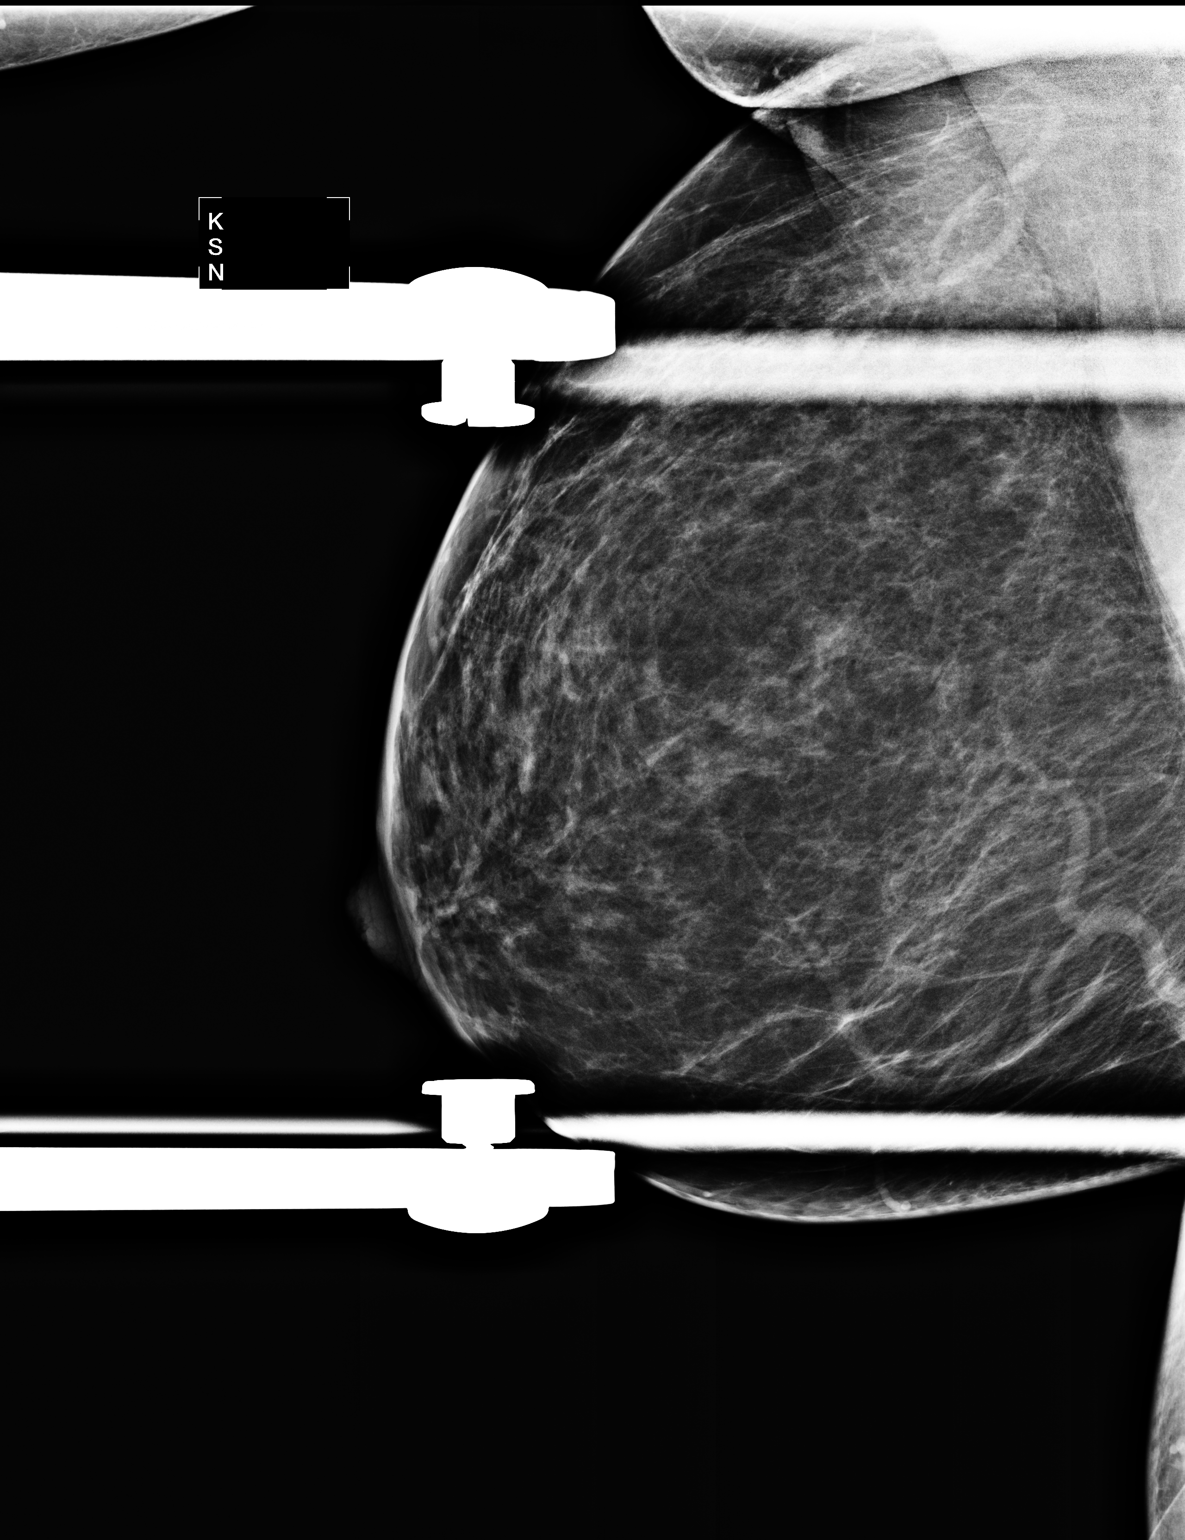

[R CC (2 of 2)]
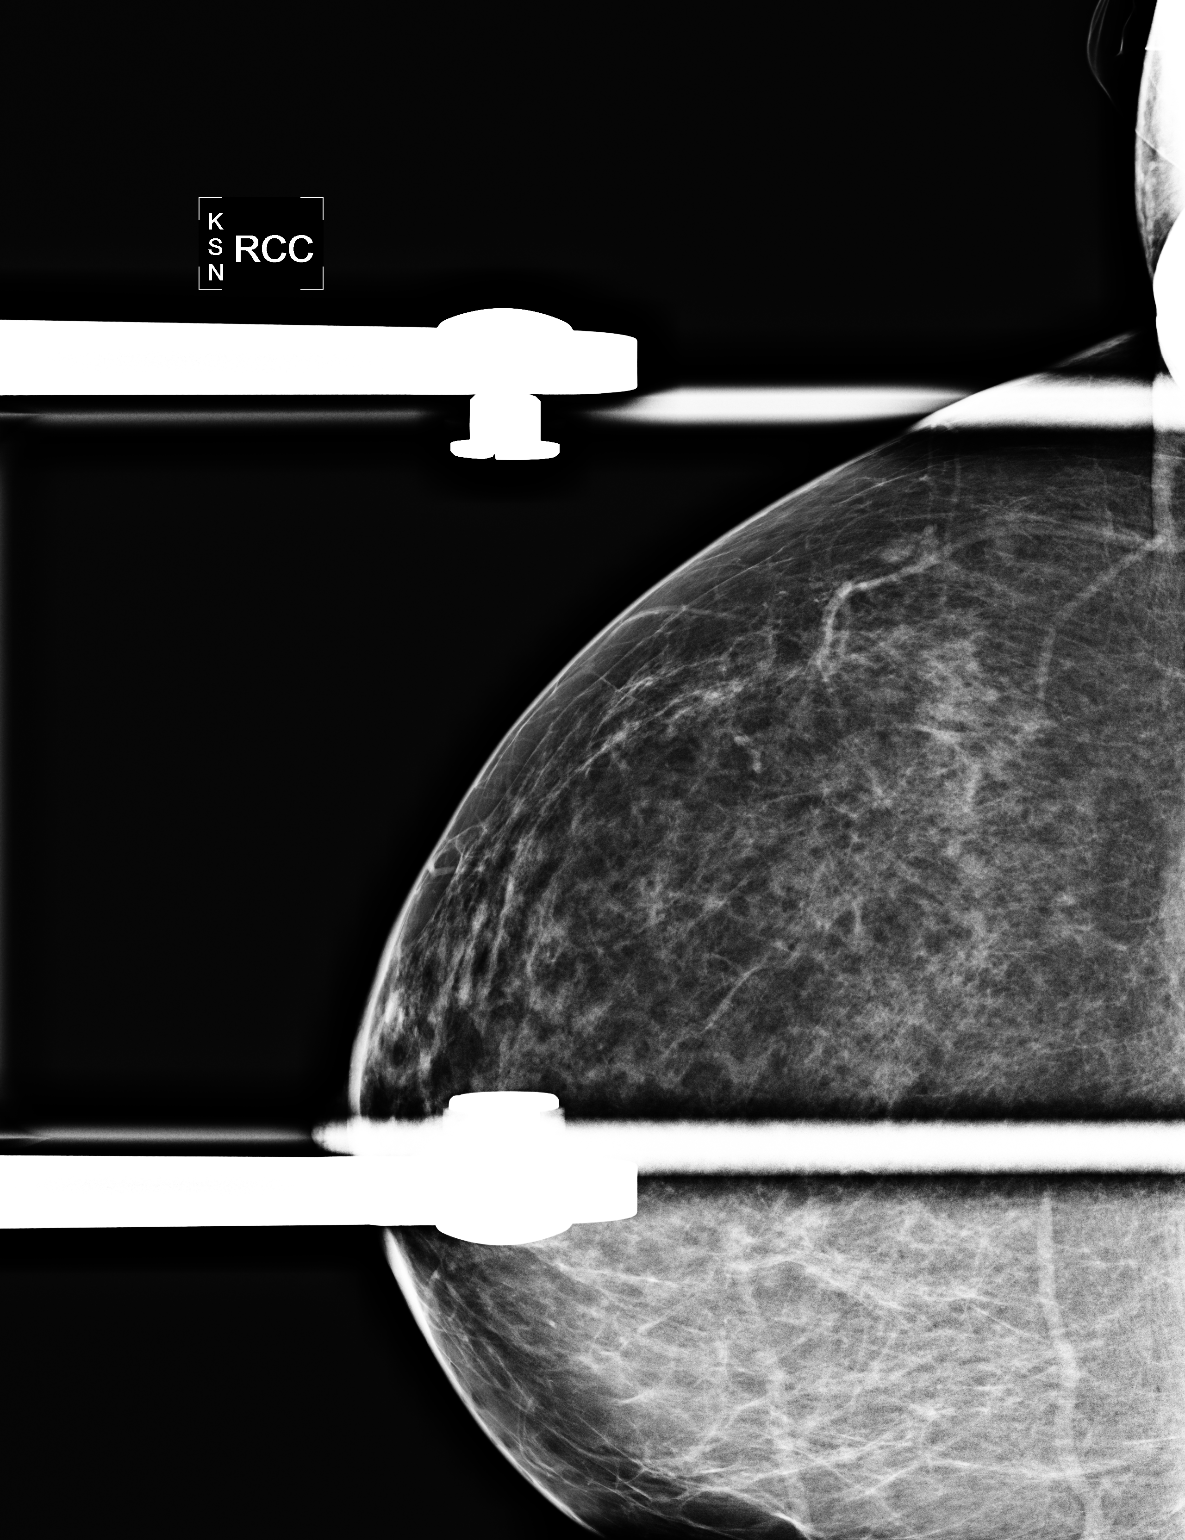

[3 of 3 positions shown; findings below may reference images not displayed]

FINDINGS: Focal spot compression views of the posterior third of
the upper outer right breast show dispersion of fibroglandular
breast tissue.  No persistent mass or distortion is identified.
Findings on the recent screening mammogram are most consistent with
overlap of fibroglandular breast tissue.
IMPRESSION: No evidence of malignancy in the right breast.

RECOMMENDATION:
Bilateral screening mammogram in 1 year.

I have discussed the findings and recommendations with the patient.
Results were also provided in writing at the conclusion of the
visit.

BI-RADS CATEGORY 1:  Negative.

## 2014-04-22 ENCOUNTER — Ambulatory Visit: Payer: Medicare PPO | Admitting: Neurology

## 2014-05-06 ENCOUNTER — Other Ambulatory Visit: Payer: Self-pay | Admitting: Obstetrics and Gynecology

## 2014-05-07 LAB — CYTOLOGY - PAP

## 2014-05-17 ENCOUNTER — Encounter (HOSPITAL_COMMUNITY): Payer: Self-pay | Admitting: Emergency Medicine

## 2014-05-17 ENCOUNTER — Emergency Department (HOSPITAL_COMMUNITY)
Admission: EM | Admit: 2014-05-17 | Discharge: 2014-05-18 | Disposition: A | Discharge status: Home / Self Care | Payer: 59 | Attending: Emergency Medicine | Admitting: Emergency Medicine

## 2014-05-17 DIAGNOSIS — Z7982 Long term (current) use of aspirin: Secondary | ICD-10-CM | POA: Diagnosis not present

## 2014-05-17 DIAGNOSIS — M109 Gout, unspecified: Secondary | ICD-10-CM | POA: Diagnosis not present

## 2014-05-17 DIAGNOSIS — Z792 Long term (current) use of antibiotics: Secondary | ICD-10-CM | POA: Insufficient documentation

## 2014-05-17 DIAGNOSIS — E669 Obesity, unspecified: Secondary | ICD-10-CM | POA: Diagnosis not present

## 2014-05-17 DIAGNOSIS — M25562 Pain in left knee: Secondary | ICD-10-CM

## 2014-05-17 DIAGNOSIS — M25561 Pain in right knee: Secondary | ICD-10-CM

## 2014-05-17 DIAGNOSIS — Z794 Long term (current) use of insulin: Secondary | ICD-10-CM | POA: Insufficient documentation

## 2014-05-17 DIAGNOSIS — Z8701 Personal history of pneumonia (recurrent): Secondary | ICD-10-CM | POA: Diagnosis not present

## 2014-05-17 DIAGNOSIS — E039 Hypothyroidism, unspecified: Secondary | ICD-10-CM | POA: Insufficient documentation

## 2014-05-17 DIAGNOSIS — N184 Chronic kidney disease, stage 4 (severe): Secondary | ICD-10-CM | POA: Diagnosis not present

## 2014-05-17 DIAGNOSIS — K219 Gastro-esophageal reflux disease without esophagitis: Secondary | ICD-10-CM | POA: Insufficient documentation

## 2014-05-17 DIAGNOSIS — M129 Arthropathy, unspecified: Secondary | ICD-10-CM | POA: Insufficient documentation

## 2014-05-17 DIAGNOSIS — Z79899 Other long term (current) drug therapy: Secondary | ICD-10-CM | POA: Diagnosis not present

## 2014-05-17 DIAGNOSIS — E1142 Type 2 diabetes mellitus with diabetic polyneuropathy: Secondary | ICD-10-CM | POA: Diagnosis not present

## 2014-05-17 DIAGNOSIS — Z862 Personal history of diseases of the blood and blood-forming organs and certain disorders involving the immune mechanism: Secondary | ICD-10-CM | POA: Insufficient documentation

## 2014-05-17 DIAGNOSIS — E1165 Type 2 diabetes mellitus with hyperglycemia: Secondary | ICD-10-CM

## 2014-05-17 DIAGNOSIS — I129 Hypertensive chronic kidney disease with stage 1 through stage 4 chronic kidney disease, or unspecified chronic kidney disease: Secondary | ICD-10-CM | POA: Diagnosis not present

## 2014-05-17 DIAGNOSIS — M25569 Pain in unspecified knee: Secondary | ICD-10-CM | POA: Insufficient documentation

## 2014-05-17 DIAGNOSIS — Z8619 Personal history of other infectious and parasitic diseases: Secondary | ICD-10-CM | POA: Insufficient documentation

## 2014-05-17 DIAGNOSIS — E119 Type 2 diabetes mellitus without complications: Secondary | ICD-10-CM | POA: Diagnosis not present

## 2014-05-17 DIAGNOSIS — E785 Hyperlipidemia, unspecified: Secondary | ICD-10-CM | POA: Diagnosis not present

## 2014-05-17 DIAGNOSIS — Z87828 Personal history of other (healed) physical injury and trauma: Secondary | ICD-10-CM | POA: Diagnosis not present

## 2014-05-17 HISTORY — DX: Polyneuropathy, unspecified: G62.9

## 2014-05-17 LAB — BASIC METABOLIC PANEL
Anion gap: 16 — ABNORMAL HIGH (ref 5–15)
BUN: 41 mg/dL — ABNORMAL HIGH (ref 6–23)
CO2: 27 mEq/L (ref 19–32)
Calcium: 9.7 mg/dL (ref 8.4–10.5)
Chloride: 91 mEq/L — ABNORMAL LOW (ref 96–112)
Creatinine, Ser: 9.3 mg/dL — ABNORMAL HIGH (ref 0.50–1.10)
GFR calc Af Amer: 5 mL/min — ABNORMAL LOW (ref >90)
GFR calc non Af Amer: 4 mL/min — ABNORMAL LOW (ref >90)
Glucose, Bld: 387 mg/dL — ABNORMAL HIGH (ref 70–99)
Potassium: 4.8 mEq/L (ref 3.7–5.3)
Sodium: 134 mEq/L — ABNORMAL LOW (ref 137–147)

## 2014-05-17 LAB — CBC WITH DIFFERENTIAL/PLATELET
Basophils Absolute: 0 10*3/uL (ref 0.0–0.1)
Basophils Relative: 1 % (ref 0–1)
Eosinophils Absolute: 0.4 10*3/uL (ref 0.0–0.7)
Eosinophils Relative: 4 % (ref 0–5)
HCT: 33.3 % — ABNORMAL LOW (ref 36.0–46.0)
Hemoglobin: 11.2 g/dL — ABNORMAL LOW (ref 12.0–15.0)
Lymphocytes Relative: 22 % (ref 12–46)
Lymphs Abs: 1.9 10*3/uL (ref 0.7–4.0)
MCH: 32.3 pg (ref 26.0–34.0)
MCHC: 33.6 g/dL (ref 30.0–36.0)
MCV: 96 fL (ref 78.0–100.0)
Monocytes Absolute: 0.6 10*3/uL (ref 0.1–1.0)
Monocytes Relative: 7 % (ref 3–12)
Neutro Abs: 5.8 10*3/uL (ref 1.7–7.7)
Neutrophils Relative %: 66 % (ref 43–77)
Platelets: 206 10*3/uL (ref 150–400)
RBC: 3.47 MIL/uL — ABNORMAL LOW (ref 3.87–5.11)
RDW: 14.4 % (ref 11.5–15.5)
WBC: 8.6 10*3/uL (ref 4.0–10.5)

## 2014-05-17 LAB — CBG MONITORING, ED: Glucose-Capillary: 340 mg/dL — ABNORMAL HIGH (ref 70–99)

## 2014-05-17 MED ORDER — ONDANSETRON HCL 4 MG/2ML IJ SOLN
4.0000 mg | Freq: Once | INTRAMUSCULAR | Status: AC
  Administered 2014-05-17: 4 mg via INTRAVENOUS
  Filled 2014-05-17: qty 2

## 2014-05-17 MED ORDER — SODIUM CHLORIDE 0.9 % IV BOLUS (SEPSIS)
250.0000 mL | Freq: Once | INTRAVENOUS | Status: AC
  Administered 2014-05-17: 250 mL via INTRAVENOUS

## 2014-05-17 MED ORDER — INSULIN ASPART 100 UNIT/ML ~~LOC~~ SOLN
45.0000 [IU] | Freq: Once | SUBCUTANEOUS | Status: AC
  Administered 2014-05-17: 45 [IU] via SUBCUTANEOUS
  Filled 2014-05-17: qty 1

## 2014-05-17 MED ORDER — OXYCODONE-ACETAMINOPHEN 5-325 MG PO TABS
2.0000 | ORAL_TABLET | Freq: Once | ORAL | Status: AC
  Administered 2014-05-17: 2 via ORAL
  Filled 2014-05-17: qty 2

## 2014-05-17 MED ORDER — MORPHINE SULFATE 4 MG/ML IJ SOLN
4.0000 mg | Freq: Once | INTRAMUSCULAR | Status: AC
  Administered 2014-05-17: 4 mg via INTRAVENOUS
  Filled 2014-05-17: qty 1

## 2014-05-17 MED ORDER — ONDANSETRON 4 MG PO TBDP
8.0000 mg | ORAL_TABLET | Freq: Once | ORAL | Status: AC
  Administered 2014-05-17: 8 mg via ORAL
  Filled 2014-05-17: qty 2

## 2014-05-17 NOTE — ED Provider Notes (Signed)
CSN: [id number]     Arrival date & time 05/17/14  1759 History   First MD Initiated Contact with Patient 05/17/14 1936     Chief Complaint  Patient presents with  . Leg Pain  . Knee Pain     (Consider location/radiation/quality/duration/timing/severity/associated sxs/prior Treatment) HPI Comments: Patient is a 53 year old female with hyperlipidemia, hypertension, hypothyroidism, diabetes, renal transplant, PVD, gout who presents today with bilateral knee pain. She states that she has baseline neuropathy, but this does not feel like that. She describes the pain as sharp. Pain is worse with movement and palpation. Her pain feels like the pain she gets in her feet with her gout. She denies injury to the area. No fevers, chills, nausea, vomiting, chest pain, shortness of breath.   The history is provided by the patient. No language interpreter was used.    Past Medical History  Diagnosis Date  . Hyperlipidemia   . Unspecified essential hypertension   . Shingles   . Obesity   . Hypothyroidism   . Diabetes mellitus type II   . Nonspecific abnormal unspecified cardiovascular function study   . Foot pain   . Renal transplant disorder   . Peripheral vascular disease   . PAD (peripheral artery disease)   . Diabetic peripheral neuropathy   . Dyspnea on exertion   . Anemia   . Gout   . Pneumonia 09/2012  . Sleep apnea     "getting ready to get tested again cause dr says I have it" (07/24/2013)  . GERD (gastroesophageal reflux disease)   . Migraines     "q 3 months" (07/24/2013)  . Arthritis     "hands" (07/24/2013)  . Chronic kidney disease, stage IV (severe)     "never went on dialysis" (07/24/2013)  . Peripheral neuropathy    Past Surgical History  Procedure Laterality Date  . Av fistula placement, radiocephalic Right [id number]    "upper arm" (07/24/2013)  . Cesarean section  1986; 1987  . Endometrial cryoablation  1980's  . Colonoscopy    . Av fistula placement Right 2000     "lower arm" (07/24/2013)  . Transplantation renal  2013    "right" (09/04/2012)  . Cholecystectomy  2011  . Varicose vein surgery  1997    "RLE; stripped" (09/04/2012)  . Video bronchoscopy  09/07/2012    Procedure: VIDEO BRONCHOSCOPY WITHOUT FLUORO;  Surgeon: Chesley Mires, MD;  Location: [facility] ENDOSCOPY;  Service: Cardiopulmonary;  Laterality: Bilateral;  . Tubal ligation  1987  . Cataract extraction w/ intraocular lens  implant, bilateral Bilateral 2011  . Intrauterine device (iud) insertion  2011   Family History  Problem Relation Age of Onset  . Colon cancer Father     deceased at age 70   History  Substance Use Topics  . Smoking status: Never Smoker   . Smokeless tobacco: Never Used  . Alcohol Use: No   OB History   Grav Para Term Preterm Abortions TAB SAB Ect Mult Living                 Review of Systems  Constitutional: Negative for fever and chills.  Respiratory: Negative for shortness of breath.   Cardiovascular: Negative for chest pain.  Gastrointestinal: Negative for nausea, vomiting and abdominal pain.  Musculoskeletal: Positive for arthralgias.  All other systems reviewed and are negative.     Allergies  Hydrocodone-acetaminophen and Propoxyphene n-acetaminophen  Home Medications   Prior to Admission medications   Medication Sig Start  Date End Date Taking? Authorizing Provider  allopurinol (ZYLOPRIM) 100 MG tablet Take 100 mg by mouth daily.    Historical Provider, MD  amLODipine (NORVASC) 10 MG tablet Take 10 mg by mouth daily.    Historical Provider, MD  aspirin EC 81 MG tablet Take 81 mg by mouth daily.    Historical Provider, MD  Aspirin-Acetaminophen-Caffeine (EXCEDRIN EXTRA STRENGTH PO) Take 2 tablets by mouth daily as needed (pain).    Historical Provider, MD  cephALEXin (KEFLEX) 500 MG capsule Take 1 capsule (500 mg total) by mouth 2 (two) times daily. 12/22/13   Malvin Johns, MD  ergocalciferol (VITAMIN D2) 50000 UNITS capsule Take 50,000 Units by  mouth every 30 (thirty) days. On the 1st of each month    Historical Provider, MD  insulin aspart (NOVOLOG FLEXPEN) 100 UNIT/ML FlexPen Inject 28 Units into the skin 2 (two) times daily. Inject daily at noon and at [place] Provider, MD  Insulin Glargine (LANTUS SOLOSTAR) 100 UNIT/ML Solostar Pen Inject 32 Units into the skin 2 (two) times daily. Inject at 9am and 9pm    Historical Provider, MD  levothyroxine (SYNTHROID) 50 MCG tablet Take 50 mcg by mouth daily.     Historical Provider, MD  magnesium oxide (MAG-OX) 400 MG tablet Take 400 mg by mouth 3 (three) times daily. Morning, 5pm and 9pm    Historical Provider, MD  mycophenolate (MYFORTIC) 180 MG EC tablet Take 180 mg by mouth daily.     Historical Provider, MD  omeprazole (PRILOSEC OTC) 20 MG tablet Take 20 mg by mouth daily.     Historical Provider, MD  oxyCODONE-acetaminophen (PERCOCET) 5-325 MG per tablet Take 1 tablet by mouth every 4 (four) hours as needed. 12/22/13   Malvin Johns, MD  predniSONE (DELTASONE) 5 MG tablet Take 5 mg by mouth daily.  09/21/12   Historical Provider, MD  pregabalin (LYRICA) 75 MG capsule Take 225 mg by mouth at bedtime.  11/03/12   Historical Provider, MD  sennosides-docusate sodium (SENOKOT-S) 8.6-50 MG tablet Take 2 tablets by mouth daily.    Historical Provider, MD  sevelamer carbonate (RENVELA) 800 MG tablet Take 1,600 mg by mouth 3 (three) times daily with meals.    Historical Provider, MD  tacrolimus (PROGRAF) 1 MG capsule Take 2 mg by mouth 2 (two) times daily with a meal.     Historical Provider, MD   BP 141/74  Pulse 104  Temp(Src) 98.2 F (36.8 C) (Oral)  Resp 20  SpO2 95% Physical Exam  Nursing note and vitals reviewed. Constitutional: She is oriented to person, place, and time. She appears well-developed and well-nourished. No distress.  obese  HENT:  Head: Normocephalic and atraumatic.  Right Ear: External ear normal.  Left Ear: External ear normal.  Nose: Nose normal.   Mouth/Throat: Oropharynx is clear and moist.  Eyes: Conjunctivae are normal.  Neck: Normal range of motion.  Cardiovascular: Normal rate, regular rhythm, normal heart sounds, intact distal pulses and normal pulses.   Pulses:      Radial pulses are 2+ on the right side, and 2+ on the left side.       Posterior tibial pulses are 2+ on the right side, and 2+ on the left side.  Pulmonary/Chest: Effort normal and breath sounds normal. No stridor. No respiratory distress. She has no wheezes. She has no rales.  Abdominal: Soft. She exhibits no distension.  Musculoskeletal: Normal range of motion.  Exquisitely tender to palpation over knees bilaterally. No  calf tenderness. No erythema or bruising. No warmth.    Neurological: She is alert and oriented to person, place, and time. She has normal strength.  Skin: Skin is warm and dry. She is not diaphoretic. No erythema.  Psychiatric: She has a normal mood and affect. Her behavior is normal.    ED Course  Procedures (including critical care time) Labs Review Labs Reviewed  CBC WITH DIFFERENTIAL - Abnormal; Notable for the following:    RBC 3.47 (*)    Hemoglobin 11.2 (*)    HCT 33.3 (*)    All other components within normal limits  BASIC METABOLIC PANEL - Abnormal; Notable for the following:    Sodium 134 (*)    Chloride 91 (*)    Glucose, Bld 387 (*)    BUN 41 (*)    Creatinine, Ser 9.30 (*)    GFR calc non Af Amer 4 (*)    GFR calc Af Amer 5 (*)    Anion gap 16 (*)    All other components within normal limits  CBG MONITORING, ED - Abnormal; Notable for the following:    Glucose-Capillary 340 (*)    All other components within normal limits  CBG MONITORING, ED - Abnormal; Notable for the following:    Glucose-Capillary 142 (*)    All other components within normal limits    Imaging Review No results found.   EKG Interpretation None      MDM   Final diagnoses:  Bilateral knee pain  Type 2 diabetes mellitus with  hyperglycemia   Patient presents to ED with bilateral knee pain and hyperglycemia. No injury to knees. Pain feels the same as prior gout. Patient feels improved after narcotics. Patient given home insulin and fluid. Hyperglycemia is improving. Patient will follow up with PCP for better diabetes control. Discussed reasons to return to ED immediately. Vital signs stable for discharge. Discussed case with Dr. Thurnell Garbe who agrees with plan. Patient / Family / Caregiver informed of clinical course, understand medical decision-making process, and agree with plan.     Elwyn Lade, PA-C 05/26/14 [phone]

## 2014-05-17 NOTE — ED Notes (Signed)
Pt sts bilateral knee pain radiating down legs and bilateral foot numbness. sts hx of neuropathy but worse than normal.

## 2014-05-18 DIAGNOSIS — M25569 Pain in unspecified knee: Secondary | ICD-10-CM | POA: Diagnosis not present

## 2014-05-18 LAB — CBG MONITORING, ED: Glucose-Capillary: 142 mg/dL — ABNORMAL HIGH (ref 70–99)

## 2014-05-18 MED ORDER — ONDANSETRON HCL 4 MG PO TABS: 4.0000 mg | ORAL_TABLET | Freq: Four times a day (QID) | ORAL | Status: DC

## 2014-05-18 MED ORDER — OXYCODONE-ACETAMINOPHEN 5-325 MG PO TABS: 1.0000 | ORAL_TABLET | ORAL | Status: DC | PRN

## 2014-05-18 NOTE — Discharge Instructions (Signed)
High Blood Sugar °High blood sugar (hyperglycemia) means that the level of sugar in your blood is higher than it should be. Signs of high blood sugar include: °· Feeling thirsty. °· Frequent peeing (urinating). °· Feeling tired or sleepy. °· Dry mouth. °· Vision changes. °· Feeling weak. °· Feeling hungry but losing weight. °· Numbness and tingling in your hands or feet. °· Headache. °When you ignore these signs, your blood sugar may keep going up. These problems may get worse, and other problems may begin. °HOME CARE °· Check your blood sugars as told by your doctor. Write down the numbers with the date and time. °· Take the right amount of insulin or diabetes pills at the right time. Write down the dose with date and time. °· Refill your insulin or diabetes pills before running out. °· Watch what you eat. Follow your meal plan. °· Drink liquids without sugar, such as water. Check with your doctor if you have kidney or heart disease. °· Follow your doctor's orders for exercise. Exercise at the same time of day. °· Keep your doctor's appointments. °GET HELP RIGHT AWAY IF:  °· You have trouble thinking or are confused. °· You have fast breathing with fruity smelling breath. °· You pass out (faint). °· You have 2 to 3 days of high blood sugars and you do not know why. °· You have chest pain. °· You are feeling sick to your stomach (nauseous) or throwing up (vomiting). °· You have sudden vision changes. °MAKE SURE YOU:  °· Understand these instructions. °· Will watch your condition. °· Will get help right away if you are not doing well or get worse. °Document Released: 07/17/2009 Document Revised: 12/12/2011 Document Reviewed: 07/17/2009 °ExitCare® Patient Information ©2015 [employer]. This information is not intended to replace advice given to you by your health care provider. Make sure you discuss any questions you have with your health care provider. ° °

## 2014-05-27 NOTE — ED Provider Notes (Signed)
Medical screening examination/treatment/procedure(s) were performed by non-physician practitioner and as supervising physician I was immediately available for consultation/collaboration.   EKG Interpretation None        Francine Graven, DO 05/27/14 1739

## 2014-05-28 ENCOUNTER — Emergency Department (HOSPITAL_COMMUNITY): Payer: 59

## 2014-05-28 ENCOUNTER — Emergency Department (HOSPITAL_COMMUNITY)
Admission: EM | Admit: 2014-05-28 | Discharge: 2014-05-28 | Disposition: A | Discharge status: Home / Self Care | Payer: 59 | Attending: Emergency Medicine | Admitting: Emergency Medicine

## 2014-05-28 ENCOUNTER — Encounter (HOSPITAL_COMMUNITY): Payer: Self-pay | Admitting: Emergency Medicine

## 2014-05-28 DIAGNOSIS — N184 Chronic kidney disease, stage 4 (severe): Secondary | ICD-10-CM | POA: Diagnosis not present

## 2014-05-28 DIAGNOSIS — E039 Hypothyroidism, unspecified: Secondary | ICD-10-CM | POA: Insufficient documentation

## 2014-05-28 DIAGNOSIS — Z862 Personal history of diseases of the blood and blood-forming organs and certain disorders involving the immune mechanism: Secondary | ICD-10-CM | POA: Insufficient documentation

## 2014-05-28 DIAGNOSIS — M109 Gout, unspecified: Secondary | ICD-10-CM | POA: Diagnosis not present

## 2014-05-28 DIAGNOSIS — R000 Tachycardia, unspecified: Secondary | ICD-10-CM | POA: Diagnosis not present

## 2014-05-28 DIAGNOSIS — E1149 Type 2 diabetes mellitus with other diabetic neurological complication: Secondary | ICD-10-CM | POA: Insufficient documentation

## 2014-05-28 DIAGNOSIS — Z7982 Long term (current) use of aspirin: Secondary | ICD-10-CM | POA: Diagnosis not present

## 2014-05-28 DIAGNOSIS — E669 Obesity, unspecified: Secondary | ICD-10-CM | POA: Insufficient documentation

## 2014-05-28 DIAGNOSIS — Z794 Long term (current) use of insulin: Secondary | ICD-10-CM | POA: Diagnosis not present

## 2014-05-28 DIAGNOSIS — G909 Disorder of the autonomic nervous system, unspecified: Secondary | ICD-10-CM | POA: Diagnosis not present

## 2014-05-28 DIAGNOSIS — M25569 Pain in unspecified knee: Secondary | ICD-10-CM | POA: Insufficient documentation

## 2014-05-28 DIAGNOSIS — I129 Hypertensive chronic kidney disease with stage 1 through stage 4 chronic kidney disease, or unspecified chronic kidney disease: Secondary | ICD-10-CM | POA: Insufficient documentation

## 2014-05-28 DIAGNOSIS — M19049 Primary osteoarthritis, unspecified hand: Secondary | ICD-10-CM | POA: Diagnosis not present

## 2014-05-28 DIAGNOSIS — Z79899 Other long term (current) drug therapy: Secondary | ICD-10-CM | POA: Diagnosis not present

## 2014-05-28 DIAGNOSIS — Z8719 Personal history of other diseases of the digestive system: Secondary | ICD-10-CM | POA: Insufficient documentation

## 2014-05-28 DIAGNOSIS — Z940 Kidney transplant status: Secondary | ICD-10-CM | POA: Insufficient documentation

## 2014-05-28 DIAGNOSIS — G8929 Other chronic pain: Secondary | ICD-10-CM

## 2014-05-28 DIAGNOSIS — Z8701 Personal history of pneumonia (recurrent): Secondary | ICD-10-CM | POA: Insufficient documentation

## 2014-05-28 DIAGNOSIS — Z8619 Personal history of other infectious and parasitic diseases: Secondary | ICD-10-CM | POA: Diagnosis not present

## 2014-05-28 LAB — CBC
HCT: 35.8 % — ABNORMAL LOW (ref 36.0–46.0)
Hemoglobin: 12.2 g/dL (ref 12.0–15.0)
MCH: 33 pg (ref 26.0–34.0)
MCHC: 34.1 g/dL (ref 30.0–36.0)
MCV: 96.8 fL (ref 78.0–100.0)
Platelets: 243 10*3/uL (ref 150–400)
RBC: 3.7 MIL/uL — ABNORMAL LOW (ref 3.87–5.11)
RDW: 14.1 % (ref 11.5–15.5)
WBC: 9.4 10*3/uL (ref 4.0–10.5)

## 2014-05-28 LAB — BASIC METABOLIC PANEL
Anion gap: 14 (ref 5–15)
BUN: 16 mg/dL (ref 6–23)
CO2: 31 mEq/L (ref 19–32)
Calcium: 9.2 mg/dL (ref 8.4–10.5)
Chloride: 95 mEq/L — ABNORMAL LOW (ref 96–112)
Creatinine, Ser: 5.01 mg/dL — ABNORMAL HIGH (ref 0.50–1.10)
GFR calc Af Amer: 10 mL/min — ABNORMAL LOW (ref >90)
GFR calc non Af Amer: 9 mL/min — ABNORMAL LOW (ref >90)
Glucose, Bld: 98 mg/dL (ref 70–99)
Potassium: 4.1 mEq/L (ref 3.7–5.3)
Sodium: 140 mEq/L (ref 137–147)

## 2014-05-28 LAB — I-STAT TROPONIN, ED: Troponin i, poc: 0 ng/mL (ref 0.00–0.08)

## 2014-05-28 MED ORDER — HYDROMORPHONE HCL PF 1 MG/ML IJ SOLN
1.5000 mg | Freq: Once | INTRAMUSCULAR | Status: AC
  Administered 2014-05-28: 1.5 mg via INTRAMUSCULAR
  Filled 2014-05-28: qty 2

## 2014-05-28 NOTE — Discharge Instructions (Signed)

## 2014-05-28 NOTE — ED Provider Notes (Signed)
CSN: [id number]     Arrival date & time 05/28/14  1912 History   First MD Initiated Contact with Patient 05/28/14 2123     Chief Complaint  Patient presents with  . Knee Pain     (Consider location/radiation/quality/duration/timing/severity/associated sxs/prior Treatment) Patient is a 53 y.o. female presenting with knee pain.  Knee Pain Location:  Knee Time since incident:  1 week Injury: no   Knee location:  R knee and L knee Pain details:    Quality:  Aching   Radiates to:  Does not radiate   Severity:  Severe   Onset quality:  Gradual   Timing:  Constant   Progression:  Unchanged Chronicity:  Recurrent Relieved by: allopurinol, percocet. Worsened by:  Bearing weight, flexion and extension Associated symptoms: no back pain, no fever, no numbness, no stiffness, no swelling and no tingling     Past Medical History  Diagnosis Date  . Hyperlipidemia   . Unspecified essential hypertension   . Shingles   . Obesity   . Hypothyroidism   . Diabetes mellitus type II   . Nonspecific abnormal unspecified cardiovascular function study   . Foot pain   . Renal transplant disorder   . Peripheral vascular disease   . PAD (peripheral artery disease)   . Diabetic peripheral neuropathy   . Dyspnea on exertion   . Anemia   . Gout   . Pneumonia 09/2012  . Sleep apnea     "getting ready to get tested again cause dr says I have it" (07/24/2013)  . GERD (gastroesophageal reflux disease)   . Migraines     "q 3 months" (07/24/2013)  . Arthritis     "hands" (07/24/2013)  . Chronic kidney disease, stage IV (severe)     "never went on dialysis" (07/24/2013)  . Peripheral neuropathy    Past Surgical History  Procedure Laterality Date  . Av fistula placement, radiocephalic Right [id number]    "upper arm" (07/24/2013)  . Cesarean section  1986; 1987  . Endometrial cryoablation  1980's  . Colonoscopy    . Av fistula placement Right 2000    "lower arm" (07/24/2013)  . Transplantation  renal  2013    "right" (09/04/2012)  . Cholecystectomy  2011  . Varicose vein surgery  1997    "RLE; stripped" (09/04/2012)  . Video bronchoscopy  09/07/2012    Procedure: VIDEO BRONCHOSCOPY WITHOUT FLUORO;  Surgeon: Chesley Mires, MD;  Location: [facility] ENDOSCOPY;  Service: Cardiopulmonary;  Laterality: Bilateral;  . Tubal ligation  1987  . Cataract extraction w/ intraocular lens  implant, bilateral Bilateral 2011  . Intrauterine device (iud) insertion  2011   Family History  Problem Relation Age of Onset  . Colon cancer Father     deceased at age 75   History  Substance Use Topics  . Smoking status: Never Smoker   . Smokeless tobacco: Never Used  . Alcohol Use: No   OB History   Grav Para Term Preterm Abortions TAB SAB Ect Mult Living                 Review of Systems  Constitutional: Negative for fever and chills.  HENT: Negative for congestion, rhinorrhea and sore throat.   Eyes: Negative for photophobia and visual disturbance.  Respiratory: Negative for cough and shortness of breath.   Cardiovascular: Negative for chest pain and leg swelling.  Gastrointestinal: Negative for nausea, vomiting, abdominal pain, diarrhea and constipation.  Endocrine: Negative for polyphagia and polyuria.  Genitourinary:  Negative for dysuria, flank pain, vaginal bleeding, vaginal discharge and enuresis.  Musculoskeletal: Negative for back pain, gait problem and stiffness.  Skin: Negative for color change and rash.  Neurological: Negative for dizziness, syncope, light-headedness and numbness.  Hematological: Negative for adenopathy. Does not bruise/bleed easily.  All other systems reviewed and are negative.     Allergies  Hydrocodone-acetaminophen and Propoxyphene n-acetaminophen  Home Medications   Prior to Admission medications   Medication Sig Start Date End Date Taking? Authorizing Provider  allopurinol (ZYLOPRIM) 100 MG tablet Take 100 mg by mouth daily.   Yes Historical Provider, MD   aspirin EC 81 MG tablet Take 81 mg by mouth daily.   Yes Historical Provider, MD  ergocalciferol (VITAMIN D2) 50000 UNITS capsule Take 50,000 Units by mouth every 30 (thirty) days. On the 1st of each month   Yes Historical Provider, MD  insulin aspart (NOVOLOG FLEXPEN) 100 UNIT/ML FlexPen Inject 45 Units into the skin 3 (three) times daily with meals.    Yes Historical Provider, MD  levothyroxine (SYNTHROID) 50 MCG tablet Take 50 mcg by mouth daily.    Yes Historical Provider, MD  oxyCODONE-acetaminophen (PERCOCET/ROXICET) 5-325 MG per tablet Take 1 tablet by mouth every 4 (four) hours as needed for severe pain. May take 2 tablets PO q 6 hours for severe pain - Do not take with Tylenol as this tablet already contains tylenol 05/18/14  Yes Elwyn Lade, PA-C  pregabalin (LYRICA) 75 MG capsule Take 225 mg by mouth at bedtime.  11/03/12  Yes Historical Provider, MD  sennosides-docusate sodium (SENOKOT-S) 8.6-50 MG tablet Take 2 tablets by mouth daily.   Yes Historical Provider, MD  sevelamer carbonate (RENVELA) 800 MG tablet Take 1,600 mg by mouth 3 (three) times daily with meals.   Yes Historical Provider, MD   BP 94/65  Pulse 121  Temp(Src) 98.4 F (36.9 C) (Oral)  Resp 18  Ht 5\' 10"  (1.778 m)  Wt 261 lb (118.389 kg)  BMI 37.45 kg/m2  SpO2 94% Physical Exam  Vitals reviewed. Constitutional: She is oriented to person, place, and time. She appears well-developed and well-nourished.  HENT:  Head: Normocephalic and atraumatic.  Right Ear: External ear normal.  Left Ear: External ear normal.  Eyes: Conjunctivae and EOM are normal. Pupils are equal, round, and reactive to light.  Neck: Normal range of motion. Neck supple.  Cardiovascular: Regular rhythm, normal heart sounds and intact distal pulses.  Tachycardia present.   Pulmonary/Chest: Effort normal and breath sounds normal.  Abdominal: Soft. Bowel sounds are normal. There is no tenderness.  Musculoskeletal: Normal range of motion.        Right knee: She exhibits normal range of motion. Tenderness found.       Left knee: She exhibits normal range of motion. Tenderness found.  Neurological: She is alert and oriented to person, place, and time.  Skin: Skin is warm and dry.    ED Course  Procedures (including critical care time) Labs Review Labs Reviewed  CBC - Abnormal; Notable for the following:    RBC 3.70 (*)    HCT 35.8 (*)    All other components within normal limits  BASIC METABOLIC PANEL - Abnormal; Notable for the following:    Chloride 95 (*)    Creatinine, Ser 5.01 (*)    GFR calc non Af Amer 9 (*)    GFR calc Af Amer 10 (*)    All other components within normal limits  Randolm Idol, ED  Imaging Review Dg Chest 2 View  05/28/2014   CLINICAL DATA:  Right chest pain.  EXAM: CHEST  2 VIEW  COMPARISON:  04/21/2013.  FINDINGS: The heart remains normal in size and the lungs are clear. Mild scoliosis. Cholecystectomy clips.  IMPRESSION: No acute abnormality.   Electronically Signed   By: Enrique Sack M.D.   On: 05/28/2014 21:22     EKG Interpretation None      MDM   Final diagnoses:  Knee pain, chronic, unspecified laterality    53 y.o. female  with pertinent PMH of ESRD (MWF), DM, gout presents with recurrent bilateral knee pain times one week. Patient has been seen by other providers for the same pain, however states the pain keeps recurring. She denies fever, nausea, vomiting, or other infectious or systemic symptoms. She has full range of motion in bilateral knees and no external signs of injury or abnormality. She states the pain is identical to prior episodes of gout. She also complains of pain underneath her right breast with percutaneous hyperesthesia which she states is similar to shingles. Physical exam did not demonstrate any rash and her hyperesthesia did not extend throughout the course of dermatome.    Labs and imaging as above reviewed. No acute abnormalities for pt.  Tachycardia  improved with pain control to low 100s, which is consistent with prior tachycardia after stopping BP medication.  Pt symptomatically improved, and wants to return home.  Standard return precautions given.    1. Knee pain, chronic, unspecified laterality         Debby Freiberg, MD 05/28/14 [phone]

## 2014-05-28 NOTE — ED Notes (Addendum)
Pt reports ongoing pain and swelling in bilateral knees. Pt also reports pain under R breast that started last night. Pain increases with palpation and position. Graft to R arm-takes dialysis mon,wed, fri.

## 2014-06-03 ENCOUNTER — Encounter (INDEPENDENT_AMBULATORY_CARE_PROVIDER_SITE_OTHER): Payer: Medicare PPO | Admitting: Ophthalmology

## 2014-07-03 ENCOUNTER — Encounter (INDEPENDENT_AMBULATORY_CARE_PROVIDER_SITE_OTHER): Payer: Medicare PPO | Admitting: Ophthalmology

## 2014-07-10 ENCOUNTER — Other Ambulatory Visit (INDEPENDENT_AMBULATORY_CARE_PROVIDER_SITE_OTHER): Payer: Self-pay

## 2014-07-11 ENCOUNTER — Other Ambulatory Visit (INDEPENDENT_AMBULATORY_CARE_PROVIDER_SITE_OTHER): Payer: Self-pay | Admitting: General Surgery

## 2014-07-11 LAB — CBC
HCT: 38.4 % (ref 36.0–46.0)
Hemoglobin: 13.5 g/dL (ref 12.0–15.0)
MCH: 34.1 pg — ABNORMAL HIGH (ref 26.0–34.0)
MCHC: 35.2 g/dL (ref 30.0–36.0)
MCV: 97 fL (ref 78.0–100.0)
Platelets: 243 10*3/uL (ref 150–400)
RBC: 3.96 MIL/uL (ref 3.87–5.11)
RDW: 16.3 % — ABNORMAL HIGH (ref 11.5–15.5)
WBC: 18.3 10*3/uL — ABNORMAL HIGH (ref 4.0–10.5)

## 2014-07-11 LAB — VITAMIN B12: Vitamin B-12: 635 pg/mL (ref 211–911)

## 2014-07-11 LAB — FOLATE: Folate: 4.8 ng/mL

## 2014-07-12 LAB — IRON AND TIBC
%SAT: 68 % — ABNORMAL HIGH (ref 20–55)
Iron: 185 ug/dL — ABNORMAL HIGH (ref 42–145)
TIBC: 273 ug/dL (ref 250–470)
UIBC: 88 ug/dL — ABNORMAL LOW (ref 125–400)

## 2014-07-12 LAB — COMPREHENSIVE METABOLIC PANEL
ALT: 20 U/L (ref 0–35)
AST: 13 U/L (ref 0–37)
Albumin: 4.1 g/dL (ref 3.5–5.2)
Alkaline Phosphatase: 90 U/L (ref 39–117)
BUN: 64 mg/dL — ABNORMAL HIGH (ref 6–23)
CO2: 27 mEq/L (ref 19–32)
Calcium: 9.8 mg/dL (ref 8.4–10.5)
Chloride: 95 mEq/L — ABNORMAL LOW (ref 96–112)
Creat: 8.8 mg/dL — ABNORMAL HIGH (ref 0.50–1.10)
Glucose, Bld: <20 mg/dL — CL (ref 70–99)
Potassium: 4.3 mEq/L (ref 3.5–5.3)
Sodium: 141 mEq/L (ref 135–145)
Total Bilirubin: 0.3 mg/dL (ref 0.2–1.2)
Total Protein: 7.2 g/dL (ref 6.0–8.3)

## 2014-07-12 LAB — TSH: TSH: 0.39 u[IU]/mL (ref 0.350–4.500)

## 2014-07-12 LAB — T4: T4, Total: 6 ug/dL (ref 4.5–12.0)

## 2014-07-12 LAB — LIPID PANEL
Cholesterol: 197 mg/dL (ref 0–200)
HDL: 63 mg/dL (ref >39)
LDL Cholesterol: 95 mg/dL (ref 0–99)
Total CHOL/HDL Ratio: 3.1 Ratio
Triglycerides: 197 mg/dL — ABNORMAL HIGH (ref <150)
VLDL: 39 mg/dL (ref 0–40)

## 2014-07-12 LAB — HEMOGLOBIN A1C
Hgb A1c MFr Bld: 7.6 % — ABNORMAL HIGH (ref <5.7)
Mean Plasma Glucose: 171 mg/dL — ABNORMAL HIGH (ref <117)

## 2014-07-12 LAB — PROTIME-INR
INR: 1.07 (ref <1.50)
Prothrombin Time: 13.9 seconds (ref 11.6–15.2)

## 2014-07-12 LAB — VITAMIN D 25 HYDROXY (VIT D DEFICIENCY, FRACTURES): Vit D, 25-Hydroxy: 20 ng/mL — ABNORMAL LOW (ref 30–89)

## 2014-08-16 ENCOUNTER — Encounter: Payer: Self-pay | Admitting: Dietician

## 2014-08-16 ENCOUNTER — Encounter: Payer: 59 | Attending: General Surgery | Admitting: Dietician

## 2014-08-16 DIAGNOSIS — Z6839 Body mass index (BMI) 39.0-39.9, adult: Secondary | ICD-10-CM | POA: Diagnosis not present

## 2014-08-16 DIAGNOSIS — Z713 Dietary counseling and surveillance: Secondary | ICD-10-CM | POA: Diagnosis not present

## 2014-08-16 DIAGNOSIS — E669 Obesity, unspecified: Secondary | ICD-10-CM | POA: Diagnosis not present

## 2014-08-16 NOTE — Patient Instructions (Signed)
Follow Pre-Op Goals Try Protein Shakes Attend 6 months of supervised weight loss appointment Make appointment for psychological evaluation soon to start addressing binge eating

## 2014-08-16 NOTE — Progress Notes (Signed)
  Pre-Op Assessment Visit:  Pre-Operative Sleeve Gastrectomy Surgery  Medical Nutrition Therapy:  Appt start time: [id number]   End time:  0840.  Patient was seen on 08/16/2014 for Pre-Operative Sleeve Gastrectomy Nutrition Assessment. Assessment and letter of approval faxed to [facility] Bariatric Surgery Program coordinator on 08/16/2014.   Preferred Learning Style:   No preference indicated   Learning Readiness:   Contemplating  Handouts given during visit include:  Pre-Op Goals Bariatric Surgery Protein Shakes  Supplements given during visit include:  Unjury unflavored lot # [id number], exp 11/16 Unjury chicken soup lot #[id number], exp 3/16 Unjury chocolate lot # [id number], exp 10/16  Teaching Method Utilized:  Visual Auditory Hands on  Barriers to learning/adherence to lifestyle change: binge eating, stress  Demonstrated degree of understanding via:  Teach Back   [facility] to call the [facility] to enroll in Pre-Op and Post-Op Nutrition Education when surgery date is scheduled.

## 2014-08-20 ENCOUNTER — Ambulatory Visit (HOSPITAL_COMMUNITY)
Admission: RE | Admit: 2014-08-20 | Discharge: 2014-08-20 | Disposition: A | Discharge status: Home / Self Care | Payer: 59 | Source: Ambulatory Visit | Attending: General Surgery | Admitting: General Surgery

## 2014-08-20 DIAGNOSIS — Z01811 Encounter for preprocedural respiratory examination: Secondary | ICD-10-CM | POA: Insufficient documentation

## 2014-08-20 DIAGNOSIS — Z6839 Body mass index (BMI) 39.0-39.9, adult: Secondary | ICD-10-CM | POA: Diagnosis not present

## 2014-08-21 ENCOUNTER — Ambulatory Visit: Payer: Medicare Other | Admitting: Dietician

## 2014-09-09 ENCOUNTER — Encounter: Payer: 59 | Attending: General Surgery | Admitting: Dietician

## 2014-09-09 DIAGNOSIS — Z713 Dietary counseling and surveillance: Secondary | ICD-10-CM | POA: Diagnosis not present

## 2014-09-09 DIAGNOSIS — Z6839 Body mass index (BMI) 39.0-39.9, adult: Secondary | ICD-10-CM | POA: Diagnosis not present

## 2014-09-09 DIAGNOSIS — E669 Obesity, unspecified: Secondary | ICD-10-CM | POA: Insufficient documentation

## 2014-09-09 NOTE — Patient Instructions (Addendum)
Follow Pre-Op Goals Attend 6 months of supervised weight loss appointment Make appointment for psychological evaluation soon to start addressing binge eating  [facility] [place], [place] Phone: [phone] Fax: [phone]

## 2014-09-09 NOTE — Progress Notes (Signed)
  Medical Nutrition Therapy:  Appt start time: 1140 end time:  1155.  Assessment:  Primary concerns today: Wanda [name] is here today for her 1st SWL visit out of 6 in preparation for sleeve gastrectomy. She states that her sisters have both had bariatric surgery. Wanda [name] had a kidney transplant and it "did not work." Her goal is to control her HTN and diabetes so she can receive another kidney. She states that she has been working on complying with her fluid restriction and is also working on not drinking while eating. She reports that this has been difficult as she is "more of a drinker than an eater." She tried several protein shakes and likes them. Had a bariatric surgery evaluation at [facility] but is interested in seeing a counselor regularly in this area.   Weight: 268.9 lbs BMI: 39.8  MEDICATIONS: see list  Recent physical activity: not assessed  Estimated energy needs: 1500-1600 calories   Progress Towards Goal(s):  In progress.   Nutritional Diagnosis:  [place]-3.3 Overweight/obesity related to past poor dietary habits and physical inactivity as evidenced by patient w/ pending gastric sleeve surgery following dietary guidelines for continued weight loss.     Intervention:  Nutrition counseling provided.   Monitoring/Evaluation:  Dietary intake, exercise, and body weight in 4 week(s).

## 2014-10-07 ENCOUNTER — Ambulatory Visit: Payer: Medicare Other | Admitting: Dietician

## 2014-10-14 ENCOUNTER — Encounter: Payer: Medicare HMO | Attending: General Surgery | Admitting: Dietician

## 2014-10-14 DIAGNOSIS — Z6839 Body mass index (BMI) 39.0-39.9, adult: Secondary | ICD-10-CM | POA: Diagnosis not present

## 2014-10-14 DIAGNOSIS — Z713 Dietary counseling and surveillance: Secondary | ICD-10-CM | POA: Diagnosis not present

## 2014-10-14 DIAGNOSIS — E669 Obesity, unspecified: Secondary | ICD-10-CM | POA: Insufficient documentation

## 2014-10-14 NOTE — Patient Instructions (Addendum)
Follow Pre-Op Goals Attend 6 months of supervised weight loss appointment Make appointment for psychological evaluation soon to start addressing binge eating  [place], [place] Phone: [phone] Fax: [phone]   [email or web address] (fill out registration page)

## 2014-10-14 NOTE — Progress Notes (Signed)
  Supervised weight loss:  Appt start time: 1155 end time:  1210  SWL visit 2:  Primary concerns today: Wanda [name] is here today for her 2nd SWL visit out of 6 in preparation for sleeve gastrectomy. Goes to dialysis on Mondays, Wednesdays, and Fridays. She has lost 2 lbs of fluid since yesterday. She reports she has been practicing the pre op goals. Her sister had a sleeve gastrectomy also and [name] has been talking to her. Tried Premier protein shake and likes it. She also started [employer] program.   Weight: 273 lbs BMI: 40.4  MEDICATIONS: see list  Recent physical activity: not assessed  Estimated energy needs: 1500-1600 calories   Progress Towards Goal(s):  In progress.   Nutritional Diagnosis:  [place]-3.3 Overweight/obesity related to past poor dietary habits and physical inactivity as evidenced by patient w/ pending gastric sleeve surgery following dietary guidelines for continued weight loss.     Intervention:  Nutrition counseling provided.   Monitoring/Evaluation:  Dietary intake, exercise, and body weight in 4 week(s).

## 2014-10-16 ENCOUNTER — Ambulatory Visit: Payer: Medicare PPO | Admitting: Podiatry

## 2014-11-06 ENCOUNTER — Encounter: Payer: 59 | Attending: General Surgery | Admitting: Dietician

## 2014-11-06 VITALS — Ht 69.0 in | Wt 272.2 lb

## 2014-11-06 DIAGNOSIS — E669 Obesity, unspecified: Secondary | ICD-10-CM

## 2014-11-06 DIAGNOSIS — Z6839 Body mass index (BMI) 39.0-39.9, adult: Secondary | ICD-10-CM | POA: Insufficient documentation

## 2014-11-06 DIAGNOSIS — Z713 Dietary counseling and surveillance: Secondary | ICD-10-CM | POA: Insufficient documentation

## 2014-11-06 NOTE — Patient Instructions (Signed)
Follow Pre-Op Goals Attend 6 months of supervised weight loss appointment Make appointment for psychological evaluation soon to start addressing binge eating  [place], [place] Phone: [phone] Fax: [phone]   [email or web address] (fill out registration page)

## 2014-11-06 NOTE — Progress Notes (Signed)
  Supervised weight loss:  Appt start time: 1000 end time:  1015  SWL visit 3:  Primary concerns today: Fleda is here today for her 3rd SWL visit out of 6 in preparation for sleeve gastrectomy. Goes to dialysis on Mondays, Wednesdays, and Fridays. She has lost 2 lbs of fluid since yesterday. She recently purchased a sectioned plate and she pre portions her food. Eating 3x a day and snacking between and has also started eating without the TV and trying to chew thoroughly. She reports this method has helped her blood sugars. Working on not drinking while eating. Has switched from drinking Coke to [place], but trying to wean off sodas totally. Phosphorus is improving. Signed up at [email or web address] and waiting to hear back. Also called [facility] and has not gotten a call back.   Weight: 272.2 lbs BMI: 40.3  MEDICATIONS: see list  Recent physical activity: Silver Sneakers, walking  Estimated energy needs: 1500-1600 calories   Progress Towards Goal(s):  In progress.   Nutritional Diagnosis:  [place]-3.3 Overweight/obesity related to past poor dietary habits and physical inactivity as evidenced by patient w/ pending gastric sleeve surgery following dietary guidelines for continued weight loss.     Intervention:  Nutrition counseling provided.   Monitoring/Evaluation:  Dietary intake, exercise, and body weight in 4 week(s).

## 2014-12-04 ENCOUNTER — Encounter: Payer: 59 | Attending: General Surgery | Admitting: Dietician

## 2014-12-04 VITALS — Ht 69.0 in | Wt 267.5 lb

## 2014-12-04 DIAGNOSIS — Z6839 Body mass index (BMI) 39.0-39.9, adult: Secondary | ICD-10-CM | POA: Insufficient documentation

## 2014-12-04 DIAGNOSIS — E669 Obesity, unspecified: Secondary | ICD-10-CM | POA: Insufficient documentation

## 2014-12-04 DIAGNOSIS — Z713 Dietary counseling and surveillance: Secondary | ICD-10-CM | POA: Diagnosis not present

## 2014-12-04 NOTE — Patient Instructions (Addendum)
Flintstones complete (chewable) 1500 mg Calcium citrate  Vitamin B12 (under the tongue "sublingual")  Ed Administrator, [employer] (psychiatrist) [phone]

## 2014-12-04 NOTE — Progress Notes (Signed)
  Supervised weight loss:  Appt start time: 1005 end time:  1020  SWL visit 4:  Primary concerns today: Wanda [name] is here today for her 4th SWL visit out of 6 in preparation for sleeve gastrectomy. She has lost 5 pounds since last visit. Has eliminated refined carbohydrates. Stopped skipping meals and eating lunch more consistently. Working on not drinking while eating but having a hard time. Working on chewing thoroughly; stopped watching TV while eating. Pre measures food and still using sectioned plate. Her sister had the gastric sleeve and she has been leaning on her for support.   Weight: 267.5 lbs BMI: 39.6  MEDICATIONS: see list  Recent physical activity: Silver Sneakers, walking  Estimated energy needs: 1500-1600 calories   Progress Towards Goal(s):  In progress.   Nutritional Diagnosis:  [place]-3.3 Overweight/obesity related to past poor dietary habits and physical inactivity as evidenced by patient w/ pending gastric sleeve surgery following dietary guidelines for continued weight loss.     Intervention:  Nutrition counseling provided.   Monitoring/Evaluation:  Dietary intake, exercise, and body weight in 4 week(s).

## 2014-12-23 ENCOUNTER — Ambulatory Visit (INDEPENDENT_AMBULATORY_CARE_PROVIDER_SITE_OTHER): Payer: Medicare HMO | Admitting: Psychiatry

## 2014-12-30 ENCOUNTER — Ambulatory Visit: Payer: Medicare HMO | Admitting: Psychiatry

## 2015-01-02 ENCOUNTER — Ambulatory Visit: Payer: Medicare Other | Admitting: Dietician

## 2015-01-06 ENCOUNTER — Encounter: Payer: 59 | Attending: General Surgery | Admitting: Dietician

## 2015-01-06 VITALS — Ht 69.0 in | Wt 264.2 lb

## 2015-01-06 DIAGNOSIS — Z713 Dietary counseling and surveillance: Secondary | ICD-10-CM | POA: Diagnosis not present

## 2015-01-06 DIAGNOSIS — E669 Obesity, unspecified: Secondary | ICD-10-CM | POA: Insufficient documentation

## 2015-01-06 DIAGNOSIS — Z6839 Body mass index (BMI) 39.0-39.9, adult: Secondary | ICD-10-CM | POA: Insufficient documentation

## 2015-01-06 NOTE — Progress Notes (Signed)
  Supervised weight loss:  Appt start time: 1215 end time:  1230  SWL visit 5:  Primary concerns today: Wanda [name] is here today for her 5th SWL visit out of 6 in preparation for sleeve gastrectomy. She has lost 3 pounds since last visit by avoiding starches and trying to drink more water. She reports that she has been working on being consistent with her diet. She plans to follow up with psychiatrist on Thursday. They are addressing eating habits from childhood and discussing pre op goals. [place] tried Community education officer and likes them. Has also been pre preparing meals so she will be more likely to make healthy choices.   Weight: 264.2 lbs BMI: 39.1  MEDICATIONS: see list  Recent physical activity: Silver Sneakers, walking  Estimated energy needs: 1500-1600 calories   Progress Towards Goal(s):  In progress.   Nutritional Diagnosis:  [place]-3.3 Overweight/obesity related to past poor dietary habits and physical inactivity as evidenced by patient w/ pending gastric sleeve surgery following dietary guidelines for continued weight loss.     Intervention:  Nutrition counseling provided.   Monitoring/Evaluation:  Dietary intake, exercise, and body weight in 4 week(s).

## 2015-01-06 NOTE — Patient Instructions (Signed)
Flintstones complete (chewable) 1500 mg Calcium citrate  Vitamin B12 (under the tongue "sublingual")

## 2015-01-08 ENCOUNTER — Ambulatory Visit (INDEPENDENT_AMBULATORY_CARE_PROVIDER_SITE_OTHER): Payer: Medicare HMO | Admitting: Psychiatry

## 2015-01-23 ENCOUNTER — Telehealth: Payer: Self-pay | Admitting: Dietician

## 2015-01-23 NOTE — Telephone Encounter (Signed)
Called patient to inform her of protein powders that are most appropriate for dialysis patients post op bariatric surgery:   . Unjury protein powder . Pure Protein Whey powder (slightly high in carbs at 8g but 1g sugar) . GenePro protein powder . Syntrax Nectar powder

## 2015-02-03 ENCOUNTER — Ambulatory Visit: Payer: Medicare Other | Admitting: Dietician

## 2015-02-04 ENCOUNTER — Encounter: Payer: 59 | Attending: General Surgery | Admitting: Dietician

## 2015-02-04 ENCOUNTER — Encounter: Payer: Self-pay | Admitting: Dietician

## 2015-02-04 VITALS — Ht 69.0 in | Wt 260.2 lb

## 2015-02-04 DIAGNOSIS — Z6839 Body mass index (BMI) 39.0-39.9, adult: Secondary | ICD-10-CM | POA: Diagnosis not present

## 2015-02-04 DIAGNOSIS — Z713 Dietary counseling and surveillance: Secondary | ICD-10-CM | POA: Diagnosis not present

## 2015-02-04 DIAGNOSIS — E669 Obesity, unspecified: Secondary | ICD-10-CM | POA: Diagnosis present

## 2015-02-04 NOTE — Patient Instructions (Signed)
-  Try Dialyvite chewable multivitamin -1500 mg Calcium Citrate (500 mg 3x a day) -Sublingual vitamin B12

## 2015-02-04 NOTE — Progress Notes (Signed)
  Supervised weight loss:  Appt start time: 905 end time:  920  SWL visit 6:  Primary concerns today: Wanda [name] is here today for her 6th SWL visit out of 6 in preparation for sleeve gastrectomy. She has lost another 4 pounds since last visit by walking more. Has tried Syntrax protein powder and likes it.   Weight: 260.2 lbs BMI: 38.5  MEDICATIONS: see list  Recent physical activity: Silver Sneakers, walking  Estimated energy needs: 1500-1600 calories   Progress Towards Goal(s):  In progress.   Nutritional Diagnosis:  [place]-3.3 Overweight/obesity related to past poor dietary habits and physical inactivity as evidenced by patient w/ pending gastric sleeve surgery following dietary guidelines for continued weight loss.     Intervention:  Nutrition counseling provided.   Monitoring/Evaluation:  Dietary intake, exercise, and body weight in 4 week(s).

## 2015-03-30 ENCOUNTER — Encounter (HOSPITAL_COMMUNITY): Payer: Self-pay | Admitting: Family Medicine

## 2015-03-30 ENCOUNTER — Emergency Department (HOSPITAL_COMMUNITY)
Admission: EM | Admit: 2015-03-30 | Discharge: 2015-03-31 | Disposition: A | Discharge status: Home / Self Care | Payer: Commercial Managed Care - HMO | Attending: Emergency Medicine | Admitting: Emergency Medicine

## 2015-03-30 DIAGNOSIS — T25222A Burn of second degree of left foot, initial encounter: Secondary | ICD-10-CM | POA: Diagnosis not present

## 2015-03-30 DIAGNOSIS — E039 Hypothyroidism, unspecified: Secondary | ICD-10-CM | POA: Insufficient documentation

## 2015-03-30 DIAGNOSIS — Z8701 Personal history of pneumonia (recurrent): Secondary | ICD-10-CM | POA: Insufficient documentation

## 2015-03-30 DIAGNOSIS — I129 Hypertensive chronic kidney disease with stage 1 through stage 4 chronic kidney disease, or unspecified chronic kidney disease: Secondary | ICD-10-CM | POA: Diagnosis not present

## 2015-03-30 DIAGNOSIS — Y9289 Other specified places as the place of occurrence of the external cause: Secondary | ICD-10-CM | POA: Insufficient documentation

## 2015-03-30 DIAGNOSIS — M1990 Unspecified osteoarthritis, unspecified site: Secondary | ICD-10-CM | POA: Insufficient documentation

## 2015-03-30 DIAGNOSIS — E669 Obesity, unspecified: Secondary | ICD-10-CM | POA: Insufficient documentation

## 2015-03-30 DIAGNOSIS — Z79899 Other long term (current) drug therapy: Secondary | ICD-10-CM | POA: Diagnosis not present

## 2015-03-30 DIAGNOSIS — M109 Gout, unspecified: Secondary | ICD-10-CM | POA: Diagnosis not present

## 2015-03-30 DIAGNOSIS — Z794 Long term (current) use of insulin: Secondary | ICD-10-CM | POA: Insufficient documentation

## 2015-03-30 DIAGNOSIS — G629 Polyneuropathy, unspecified: Secondary | ICD-10-CM | POA: Insufficient documentation

## 2015-03-30 DIAGNOSIS — Z862 Personal history of diseases of the blood and blood-forming organs and certain disorders involving the immune mechanism: Secondary | ICD-10-CM | POA: Insufficient documentation

## 2015-03-30 DIAGNOSIS — Z7982 Long term (current) use of aspirin: Secondary | ICD-10-CM | POA: Insufficient documentation

## 2015-03-30 DIAGNOSIS — T25022A Burn of unspecified degree of left foot, initial encounter: Secondary | ICD-10-CM | POA: Diagnosis present

## 2015-03-30 DIAGNOSIS — E1342 Other specified diabetes mellitus with diabetic polyneuropathy: Secondary | ICD-10-CM | POA: Diagnosis not present

## 2015-03-30 DIAGNOSIS — Z8719 Personal history of other diseases of the digestive system: Secondary | ICD-10-CM | POA: Insufficient documentation

## 2015-03-30 DIAGNOSIS — E785 Hyperlipidemia, unspecified: Secondary | ICD-10-CM | POA: Insufficient documentation

## 2015-03-30 DIAGNOSIS — G43909 Migraine, unspecified, not intractable, without status migrainosus: Secondary | ICD-10-CM | POA: Insufficient documentation

## 2015-03-30 DIAGNOSIS — X19XXXA Contact with other heat and hot substances, initial encounter: Secondary | ICD-10-CM | POA: Insufficient documentation

## 2015-03-30 DIAGNOSIS — Y9389 Activity, other specified: Secondary | ICD-10-CM | POA: Diagnosis not present

## 2015-03-30 DIAGNOSIS — N184 Chronic kidney disease, stage 4 (severe): Secondary | ICD-10-CM | POA: Insufficient documentation

## 2015-03-30 DIAGNOSIS — Y998 Other external cause status: Secondary | ICD-10-CM | POA: Insufficient documentation

## 2015-03-30 DIAGNOSIS — T300 Burn of unspecified body region, unspecified degree: Secondary | ICD-10-CM

## 2015-03-30 DIAGNOSIS — Z8619 Personal history of other infectious and parasitic diseases: Secondary | ICD-10-CM | POA: Diagnosis not present

## 2015-03-30 LAB — CBC WITH DIFFERENTIAL/PLATELET
Basophils Absolute: 0 10*3/uL (ref 0.0–0.1)
Basophils Relative: 0 % (ref 0–1)
Eosinophils Absolute: 0.3 10*3/uL (ref 0.0–0.7)
Eosinophils Relative: 3 % (ref 0–5)
HCT: 36.9 % (ref 36.0–46.0)
Hemoglobin: 12.4 g/dL (ref 12.0–15.0)
Lymphocytes Relative: 9 % — ABNORMAL LOW (ref 12–46)
Lymphs Abs: 1.1 10*3/uL (ref 0.7–4.0)
MCH: 32.7 pg (ref 26.0–34.0)
MCHC: 33.6 g/dL (ref 30.0–36.0)
MCV: 97.4 fL (ref 78.0–100.0)
Monocytes Absolute: 0.5 10*3/uL (ref 0.1–1.0)
Monocytes Relative: 4 % (ref 3–12)
Neutro Abs: 9.4 10*3/uL — ABNORMAL HIGH (ref 1.7–7.7)
Neutrophils Relative %: 84 % — ABNORMAL HIGH (ref 43–77)
Platelets: 261 10*3/uL (ref 150–400)
RBC: 3.79 MIL/uL — ABNORMAL LOW (ref 3.87–5.11)
RDW: 14.3 % (ref 11.5–15.5)
WBC: 11.2 10*3/uL — ABNORMAL HIGH (ref 4.0–10.5)

## 2015-03-30 LAB — COMPREHENSIVE METABOLIC PANEL
ALT: 16 U/L (ref 14–54)
AST: 20 U/L (ref 15–41)
Albumin: 3.7 g/dL (ref 3.5–5.0)
Alkaline Phosphatase: 97 U/L (ref 38–126)
Anion gap: 14 (ref 5–15)
BUN: 15 mg/dL (ref 6–20)
CO2: 31 mmol/L (ref 22–32)
Calcium: 8.5 mg/dL — ABNORMAL LOW (ref 8.9–10.3)
Chloride: 93 mmol/L — ABNORMAL LOW (ref 101–111)
Creatinine, Ser: 6.73 mg/dL — ABNORMAL HIGH (ref 0.44–1.00)
GFR calc Af Amer: 7 mL/min — ABNORMAL LOW (ref >60)
GFR calc non Af Amer: 6 mL/min — ABNORMAL LOW (ref >60)
Glucose, Bld: 137 mg/dL — ABNORMAL HIGH (ref 65–99)
Potassium: 3.9 mmol/L (ref 3.5–5.1)
Sodium: 138 mmol/L (ref 135–145)
Total Bilirubin: 0.5 mg/dL (ref 0.3–1.2)
Total Protein: 7.6 g/dL (ref 6.5–8.1)

## 2015-03-30 MED ORDER — OXYCODONE-ACETAMINOPHEN 5-325 MG PO TABS
1.0000 | ORAL_TABLET | Freq: Once | ORAL | Status: AC
  Administered 2015-03-30: 1 via ORAL
  Filled 2015-03-30: qty 1

## 2015-03-30 NOTE — ED Notes (Signed)
Pt here for burn to the bottom of her left foot. sts she stepped on some hot sand this weekend and being a diabetic did not feel the heat. sts she went to dialysis today and they sent her here. Was told she needed IV abx,

## 2015-03-30 NOTE — ED Provider Notes (Signed)
CSN: [id number]     Arrival date & time 03/30/15  1718 History   First MD Initiated Contact with Patient 03/30/15 2133     Chief Complaint  Patient presents with  . Foot Burn    HPI   54 year old female with a history of diabetes presents today with a burn to the bottom of her left foot. Patient reports that she was on the sand 2 days ago, has diabetic neuropathy and was unable to feel the burn. She reports skin peeling that following day. Patient reports she was seen at dialysis today and instructed to be seen in the emergency room. Pt denies signs of surrounding infection, reports minimal pain to the bottom of the foot. Patient reports her blood sugars been adequately managed, with most recent in the 130s today. Patient has a minor blister to the left foot, no signs of surrounding infection.  Past Medical History  Diagnosis Date  . Hyperlipidemia   . Unspecified essential hypertension   . Shingles   . Obesity   . Hypothyroidism   . Diabetes mellitus type II   . Nonspecific abnormal unspecified cardiovascular function study   . Foot pain   . Renal transplant disorder   . Peripheral vascular disease   . PAD (peripheral artery disease)   . Diabetic peripheral neuropathy   . Dyspnea on exertion   . Anemia   . Gout   . Pneumonia 09/2012  . Sleep apnea     "getting ready to get tested again cause dr says I have it" (07/24/2013)  . GERD (gastroesophageal reflux disease)   . Migraines     "q 3 months" (07/24/2013)  . Arthritis     "hands" (07/24/2013)  . Chronic kidney disease, stage IV (severe)     "never went on dialysis" (07/24/2013)  . Peripheral neuropathy    Past Surgical History  Procedure Laterality Date  . Av fistula placement, radiocephalic Right [id number]    "upper arm" (07/24/2013)  . Cesarean section  1986; 1987  . Endometrial cryoablation  1980's  . Colonoscopy    . Av fistula placement Right 2000    "lower arm" (07/24/2013)  . Transplantation renal  2013   "right" (09/04/2012)  . Cholecystectomy  2011  . Varicose vein surgery  1997    "RLE; stripped" (09/04/2012)  . Video bronchoscopy  09/07/2012    Procedure: VIDEO BRONCHOSCOPY WITHOUT FLUORO;  Surgeon: Chesley Mires, MD;  Location: [facility] ENDOSCOPY;  Service: Cardiopulmonary;  Laterality: Bilateral;  . Tubal ligation  1987  . Cataract extraction w/ intraocular lens  implant, bilateral Bilateral 2011  . Intrauterine device (iud) insertion  2011   Family History  Problem Relation Age of Onset  . Colon cancer Father     deceased at age 69   History  Substance Use Topics  . Smoking status: Never Smoker   . Smokeless tobacco: Never Used  . Alcohol Use: No   OB History    No data available     Review of Systems  All other systems reviewed and are negative.     Allergies  Hydrocodone-acetaminophen and Propoxyphene n-acetaminophen  Home Medications   Prior to Admission medications   Medication Sig Start Date End Date Taking? Authorizing Provider  allopurinol (ZYLOPRIM) 100 MG tablet Take 100 mg by mouth daily.    Historical Provider, MD  aspirin EC 81 MG tablet Take 81 mg by mouth daily.    Historical Provider, MD  ergocalciferol (VITAMIN D2) 50000 UNITS capsule Take 50,000  Units by mouth every 30 (thirty) days. On the 1st of each month    Historical Provider, MD  insulin aspart (NOVOLOG FLEXPEN) 100 UNIT/ML FlexPen Inject 45 Units into the skin 3 (three) times daily with meals.     Historical Provider, MD  levothyroxine (SYNTHROID) 50 MCG tablet Take 50 mcg by mouth daily.     Historical Provider, MD  oxyCODONE-acetaminophen (PERCOCET/ROXICET) 5-325 MG per tablet Take 1 tablet by mouth every 4 (four) hours as needed for severe pain. 03/31/15   Okey Regal, PA-C  pregabalin (LYRICA) 75 MG capsule Take 225 mg by mouth at bedtime.  11/03/12   Historical Provider, MD  sennosides-docusate sodium (SENOKOT-S) 8.6-50 MG tablet Take 2 tablets by mouth daily.    Historical Provider, MD  sevelamer  carbonate (RENVELA) 800 MG tablet Take 1,600 mg by mouth 3 (three) times daily with meals.    Historical Provider, MD   BP 130/46 mmHg  Pulse 95  Temp(Src) 98.7 F (37.1 C) (Oral)  Resp 18  Wt 260 lb (117.935 kg)  SpO2 92%   Physical Exam  Constitutional: She is oriented to person, place, and time. She appears well-developed and well-nourished.  HENT:  Head: Normocephalic and atraumatic.  Eyes: Conjunctivae are normal. Pupils are equal, round, and reactive to light. Right eye exhibits no discharge. Left eye exhibits no discharge. No scleral icterus.  Neck: Normal range of motion. No JVD present. No tracheal deviation present.  Pulmonary/Chest: Effort normal. No stridor.  Neurological: She is alert and oriented to person, place, and time. Coordination normal.  Skin:  Second degree burn as noted in picture below. No signs of surrounding cellulitis.  Psychiatric: She has a normal mood and affect. Her behavior is normal. Judgment and thought content normal.  Nursing note and vitals reviewed.       ED Course  Procedures (including critical care time) Labs Review Labs Reviewed  CBC WITH DIFFERENTIAL/PLATELET - Abnormal; Notable for the following:    WBC 11.2 (*)    RBC 3.79 (*)    Neutrophils Relative % 84 (*)    Neutro Abs 9.4 (*)    Lymphocytes Relative 9 (*)    All other components within normal limits  COMPREHENSIVE METABOLIC PANEL - Abnormal; Notable for the following:    Chloride 93 (*)    Glucose, Bld 137 (*)    Creatinine, Ser 6.73 (*)    Calcium 8.5 (*)    GFR calc non Af Amer 6 (*)    GFR calc Af Amer 7 (*)    All other components within normal limits    Imaging Review No results found.   EKG Interpretation None      MDM   Final diagnoses:  Burn    Labs: CBC, CMP- significant for WBC 11.2, creatinine is 6.73, glucose of 137  Imaging:  Consults: [facility]- Dr Eilleen Kempf  Therapeutics: Oxycodone and Silvadene   Plan: Patient presents with  a second-degree burn to the bottom of her foot. Due to her diabetic state and area of burn, burn center at [place] was consult. They instructed her to follow up first thing tomorrow morning for further evaluation. Patient was given oral pain medication here in the ED, prescription to take home. She was to private here with minimal pain, with Silvadene cream applied. Patient is instructed to reapply with new dressing twice daily, instructed follow-up immediately with the burn center. She'll return precautions given, patient verbalizes understanding and agreement to today's plan and  had no further questions concerns at time of discharge      Okey Regal, PA-C 04/02/15 [place], MD 04/03/15 1042

## 2015-03-31 DIAGNOSIS — T25222A Burn of second degree of left foot, initial encounter: Secondary | ICD-10-CM | POA: Diagnosis not present

## 2015-03-31 MED ORDER — SILVER SULFADIAZINE 1 % EX CREA
TOPICAL_CREAM | Freq: Two times a day (BID) | CUTANEOUS | Status: DC
  Administered 2015-03-31: 01:00:00 via TOPICAL
  Filled 2015-03-31: qty 85

## 2015-03-31 MED ORDER — OXYCODONE-ACETAMINOPHEN 5-325 MG PO TABS: 1.0000 | ORAL_TABLET | ORAL | Status: DC | PRN

## 2015-03-31 NOTE — Discharge Instructions (Signed)
Burn Care Your skin is a natural barrier to infection. It is the largest organ of your body. Burns damage this natural protection. To help prevent infection, it is very important to follow your caregiver's instructions in the care of your burn. Burns are classified as:  First degree. There is only redness of the skin (erythema). No scarring is expected.  Second degree. There is blistering of the skin. Scarring may occur with deeper burns.  Third degree. All layers of the skin are injured, and scarring is expected. HOME CARE INSTRUCTIONS   Wash your hands well before changing your bandage.  Change your bandage as often as directed by your caregiver.  Remove the old bandage. If the bandage sticks, you may soak it off with cool, clean water.  Cleanse the burn thoroughly but gently with mild soap and water.  Pat the area dry with a clean, dry cloth.  Apply a thin layer of antibacterial cream to the burn.  Apply a clean bandage as instructed by your caregiver.  Keep the bandage as clean and dry as possible.  Elevate the affected area for the first 24 hours, then as instructed by your caregiver.  Only take over-the-counter or prescription medicines for pain, discomfort, or fever as directed by your caregiver. SEEK IMMEDIATE MEDICAL CARE IF:   You develop excessive pain.  You develop redness, tenderness, swelling, or red streaks near the burn.  The burned area develops yellowish-white fluid (pus) or a bad smell.  You have a fever. MAKE SURE YOU:   Understand these instructions.  Will watch your condition.  Will get help right away if you are not doing well or get worse. Document Released: 09/19/2005 Document Revised: 12/12/2011 Document Reviewed: 02/09/2011 [employer] Patient Information 2015 [place], [place]. This information is not intended to replace advice given to you by your health care provider. Make sure you discuss any questions you have with your health care  provider.  Please read attached information, please follow-up with burn center first thing tomorrow morning. Please apply cream twice daily. Monitor for signs of infection returning immediately if any present

## 2015-04-21 DIAGNOSIS — M792 Neuralgia and neuritis, unspecified: Secondary | ICD-10-CM | POA: Insufficient documentation

## 2015-04-21 DIAGNOSIS — T310 Burns involving less than 10% of body surface: Secondary | ICD-10-CM | POA: Insufficient documentation

## 2015-04-21 DIAGNOSIS — G8911 Acute pain due to trauma: Secondary | ICD-10-CM | POA: Insufficient documentation

## 2015-04-21 DIAGNOSIS — G629 Polyneuropathy, unspecified: Secondary | ICD-10-CM | POA: Insufficient documentation

## 2015-04-27 ENCOUNTER — Encounter: Payer: Medicare HMO | Attending: General Surgery

## 2015-04-27 VITALS — Ht 69.0 in | Wt 255.0 lb

## 2015-04-27 DIAGNOSIS — E669 Obesity, unspecified: Secondary | ICD-10-CM

## 2015-04-27 DIAGNOSIS — Z6839 Body mass index (BMI) 39.0-39.9, adult: Secondary | ICD-10-CM | POA: Diagnosis not present

## 2015-04-27 DIAGNOSIS — Z713 Dietary counseling and surveillance: Secondary | ICD-10-CM | POA: Diagnosis not present

## 2015-04-27 NOTE — Progress Notes (Signed)
  Pre-Operative Nutrition Class:  Appt start time: 0415   End time:  1830.  Patient was seen on 04/27/2015 for Pre-Operative Bariatric Surgery [facility]   Surgery date:  Surgery type: Sleeve Gastrectomy Start weight at [facility]: 268 lbs on 08/16/14 Weight today: 255 lbs  TANITA  BODY COMP RESULTS  04/27/15   BMI (kg/m^2) 37.7   Fat Mass (lbs) 116   Fat Free Mass (lbs) 139   Total Body Water (lbs) 101.5   Samples given per MNT protocol. Patient educated on appropriate usage:  Celebrate Calcium Citrate chew (berry - qty 1) Lot #: [id number] Exp: 12/2016  Renee Pain Protein Powder (vanilla - qty 2) Lot #: [id number] Exp: 04/2016  The following the learning objectives were met by the patient during this course:  Identify Pre-Op Dietary Goals and will begin 2 weeks pre-operatively  Identify appropriate sources of fluids and proteins   State protein recommendations and appropriate sources pre and post-operatively  Identify Post-Operative Dietary Goals and will follow for 2 weeks post-operatively  Identify appropriate multivitamin and calcium sources  Describe the need for physical activity post-operatively and will follow MD recommendations  State when to call healthcare provider regarding medication questions or post-operative complications  Handouts given during class include:  Pre-Op Bariatric Surgery Diet Handout  Protein Shake Handout  Post-Op Bariatric Surgery Nutrition Handout  BELT Program Information Flyer  Support Group Information Flyer  [facility] Bariatric Supplements Price List  Follow-Up Plan: Patient will follow-up at [facility] 2 weeks post operatively for diet advancement per MD.

## 2015-05-18 ENCOUNTER — Other Ambulatory Visit: Payer: Self-pay | Admitting: General Surgery

## 2015-05-21 ENCOUNTER — Encounter (HOSPITAL_COMMUNITY)
Admission: RE | Admit: 2015-05-21 | Discharge: 2015-05-21 | Disposition: A | Discharge status: Home / Self Care | Payer: Medicare HMO | Source: Ambulatory Visit | Attending: General Surgery | Admitting: General Surgery

## 2015-05-21 ENCOUNTER — Encounter (HOSPITAL_COMMUNITY): Payer: Self-pay

## 2015-05-21 DIAGNOSIS — Z01818 Encounter for other preprocedural examination: Secondary | ICD-10-CM | POA: Insufficient documentation

## 2015-05-21 HISTORY — DX: Dependence on renal dialysis: Z99.2

## 2015-05-21 HISTORY — DX: Personal history of other infectious and parasitic diseases: Z86.19

## 2015-05-21 NOTE — Progress Notes (Signed)
CXR epic 08/20/2014 EKG epic 08/20/2014 Stress test care everywhere epic 03/19/2015 ECHO epic 01/09/2013 and again 03/19/2015 per care everywhere epic

## 2015-05-21 NOTE — Patient Instructions (Signed)
CHARNEE NELIS  05/21/2015   Your procedure is scheduled on: Tuesday May 26, 2015   Report to [facility]  Entrance take [place]  elevators to 3rd floor to  [place] at 10:15 AM.  Call this number if you have problems the morning of surgery [phone]   Remember: ONLY 1 PERSON MAY GO WITH YOU TO SHORT STAY TO GET  READY MORNING OF [place].   Do not eat food After Midnight but may take clear liquids till 6 am day of surgery then nothing by mouth.  EAT A HEALTHY SNACK NIGHT PRIOR TO SURGERY.      Take these medicines the morning of surgery with A SIP OF WATER: Allopurinol; Gabapentin (Neurontin);    TAKE 1/2 OF USUAL DOSE OF INSULIN NIGHT PRIOR TO SURGERY.                                You may not have any metal on your body including hair pins and              piercings  Do not wear jewelry, make-up, lotions, powders or perfumes, deodorant             Do not wear nail polish.  Do not shave  48 hours prior to surgery.                Do not bring valuables to the hospital. [place].  Contacts, dentures or bridgework may not be worn into surgery.  Leave suitcase in the car. After surgery it may be brought to your room.     _____________________________________________________________________             [facility] - Preparing for Surgery Before surgery, you can play an important role.  Because skin is not sterile, your skin needs to be as free of germs as possible.  You can reduce the number of germs on your skin by washing with CHG (chlorahexidine gluconate) soap before surgery.  CHG is an antiseptic cleaner which kills germs and bonds with the skin to continue killing germs even after washing. Please DO NOT use if you have an allergy to CHG or antibacterial soaps.  If your skin becomes reddened/irritated stop using the CHG and inform your nurse when you arrive at Short Stay. Do not shave  (including legs and underarms) for at least 48 hours prior to the first CHG shower.  You may shave your face/neck. Please follow these instructions carefully:  1.  Shower with CHG Soap the night before surgery and the  morning of Surgery.  2.  If you choose to wash your hair, wash your hair first as usual with your  normal  shampoo.  3.  After you shampoo, rinse your hair and body thoroughly to remove the  shampoo.                           4.  Use CHG as you would any other liquid soap.  You can apply chg directly  to the skin and wash                       Gently with  a scrungie or clean washcloth.  5.  Apply the CHG Soap to your body ONLY FROM THE NECK DOWN.   Do not use on face/ open                           Wound or open sores. Avoid contact with eyes, ears mouth and genitals (private parts).                       Wash face,  Genitals (private parts) with your normal soap.             6.  Wash thoroughly, paying special attention to the area where your surgery  will be performed.  7.  Thoroughly rinse your body with warm water from the neck down.  8.  DO NOT shower/wash with your normal soap after using and rinsing off  the CHG Soap.                9.  Pat yourself dry with a clean towel.            10.  Wear clean pajamas.            11.  Place clean sheets on your bed the night of your first shower and do not  sleep with pets. Day of Surgery : Do not apply any lotions/deodorants the morning of surgery.  Please wear clean clothes to the hospital/surgery center.  FAILURE TO FOLLOW THESE INSTRUCTIONS MAY RESULT IN THE CANCELLATION OF YOUR SURGERY PATIENT SIGNATURE_________________________________  NURSE SIGNATURE__________________________________  ________________________________________________________________________    CLEAR LIQUID DIET   Foods Allowed                                                                     Foods Excluded  Coffee and tea, regular and decaf                              liquids that you cannot  Plain Jell-O in any flavor                                             see through such as: Fruit ices (not with fruit pulp)                                     milk, soups, orange juice  Iced Popsicles                                    All solid food Carbonated beverages, regular and diet                                    Cranberry, grape and apple juices Sports drinks like Gatorade Lightly seasoned clear broth or consume(fat free) Sugar, honey syrup  Sample Menu  Breakfast                                Lunch                                     Supper Cranberry juice                    Beef broth                            Chicken broth Jell-O                                     Grape juice                           Apple juice Coffee or tea                        Jell-O                                      Popsicle                                                Coffee or tea                        Coffee or tea  _____________________________________________________________________

## 2015-05-22 ENCOUNTER — Other Ambulatory Visit: Payer: Self-pay | Admitting: General Surgery

## 2015-05-22 ENCOUNTER — Other Ambulatory Visit: Payer: Self-pay

## 2015-05-22 DIAGNOSIS — Z01818 Encounter for other preprocedural examination: Secondary | ICD-10-CM

## 2015-05-25 ENCOUNTER — Ambulatory Visit (HOSPITAL_COMMUNITY)
Admission: RE | Admit: 2015-05-25 | Discharge: 2015-05-25 | Disposition: A | Discharge status: Home / Self Care | Payer: Medicare HMO | Source: Ambulatory Visit | Attending: General Surgery | Admitting: General Surgery

## 2015-05-25 DIAGNOSIS — Z01818 Encounter for other preprocedural examination: Secondary | ICD-10-CM | POA: Insufficient documentation

## 2015-05-26 ENCOUNTER — Ambulatory Visit (HOSPITAL_COMMUNITY): Payer: Medicare HMO | Admitting: Anesthesiology

## 2015-05-26 ENCOUNTER — Inpatient Hospital Stay (HOSPITAL_COMMUNITY)
Admission: AD | Admit: 2015-05-26 | Discharge: 2015-05-28 | DRG: 619 | Disposition: A | Discharge status: Home / Self Care | Payer: Medicare HMO | Source: Ambulatory Visit | Attending: General Surgery | Admitting: General Surgery

## 2015-05-26 ENCOUNTER — Encounter (HOSPITAL_COMMUNITY): Payer: Self-pay

## 2015-05-26 ENCOUNTER — Encounter (HOSPITAL_COMMUNITY)
Admission: AD | Disposition: A | Discharge status: Home / Self Care | Payer: Self-pay | Source: Ambulatory Visit | Attending: General Surgery

## 2015-05-26 DIAGNOSIS — Z794 Long term (current) use of insulin: Secondary | ICD-10-CM

## 2015-05-26 DIAGNOSIS — Z7982 Long term (current) use of aspirin: Secondary | ICD-10-CM

## 2015-05-26 DIAGNOSIS — N2581 Secondary hyperparathyroidism of renal origin: Secondary | ICD-10-CM | POA: Diagnosis present

## 2015-05-26 DIAGNOSIS — E875 Hyperkalemia: Secondary | ICD-10-CM | POA: Diagnosis present

## 2015-05-26 DIAGNOSIS — Z885 Allergy status to narcotic agent status: Secondary | ICD-10-CM | POA: Diagnosis not present

## 2015-05-26 DIAGNOSIS — I120 Hypertensive chronic kidney disease with stage 5 chronic kidney disease or end stage renal disease: Secondary | ICD-10-CM | POA: Diagnosis present

## 2015-05-26 DIAGNOSIS — Y830 Surgical operation with transplant of whole organ as the cause of abnormal reaction of the patient, or of later complication, without mention of misadventure at the time of the procedure: Secondary | ICD-10-CM | POA: Diagnosis present

## 2015-05-26 DIAGNOSIS — E1142 Type 2 diabetes mellitus with diabetic polyneuropathy: Secondary | ICD-10-CM | POA: Diagnosis present

## 2015-05-26 DIAGNOSIS — D631 Anemia in chronic kidney disease: Secondary | ICD-10-CM | POA: Diagnosis present

## 2015-05-26 DIAGNOSIS — T8612 Kidney transplant failure: Secondary | ICD-10-CM | POA: Diagnosis present

## 2015-05-26 DIAGNOSIS — E1122 Type 2 diabetes mellitus with diabetic chronic kidney disease: Secondary | ICD-10-CM | POA: Diagnosis present

## 2015-05-26 DIAGNOSIS — N186 End stage renal disease: Secondary | ICD-10-CM | POA: Diagnosis present

## 2015-05-26 DIAGNOSIS — K219 Gastro-esophageal reflux disease without esophagitis: Secondary | ICD-10-CM | POA: Diagnosis present

## 2015-05-26 DIAGNOSIS — Z79899 Other long term (current) drug therapy: Secondary | ICD-10-CM | POA: Diagnosis not present

## 2015-05-26 DIAGNOSIS — Z905 Acquired absence of kidney: Secondary | ICD-10-CM | POA: Diagnosis present

## 2015-05-26 DIAGNOSIS — Z992 Dependence on renal dialysis: Secondary | ICD-10-CM | POA: Diagnosis not present

## 2015-05-26 HISTORY — PX: LAPAROSCOPIC GASTRIC SLEEVE RESECTION: SHX5895

## 2015-05-26 LAB — BASIC METABOLIC PANEL
Anion gap: 13 (ref 5–15)
BUN: 44 mg/dL — ABNORMAL HIGH (ref 6–20)
CO2: 24 mmol/L (ref 22–32)
Calcium: 8.4 mg/dL — ABNORMAL LOW (ref 8.9–10.3)
Chloride: 102 mmol/L (ref 101–111)
Creatinine, Ser: 10.08 mg/dL — ABNORMAL HIGH (ref 0.44–1.00)
GFR calc Af Amer: 4 mL/min — ABNORMAL LOW (ref >60)
GFR calc non Af Amer: 4 mL/min — ABNORMAL LOW (ref >60)
Glucose, Bld: 162 mg/dL — ABNORMAL HIGH (ref 65–99)
Potassium: 6 mmol/L — ABNORMAL HIGH (ref 3.5–5.1)
Sodium: 139 mmol/L (ref 135–145)

## 2015-05-26 LAB — CBC WITH DIFFERENTIAL/PLATELET
Basophils Absolute: 0.1 10*3/uL (ref 0.0–0.1)
Basophils Relative: 1 % (ref 0–1)
Eosinophils Absolute: 0.3 10*3/uL (ref 0.0–0.7)
Eosinophils Relative: 3 % (ref 0–5)
HCT: 34.7 % — ABNORMAL LOW (ref 36.0–46.0)
Hemoglobin: 11.2 g/dL — ABNORMAL LOW (ref 12.0–15.0)
Lymphocytes Relative: 22 % (ref 12–46)
Lymphs Abs: 1.8 10*3/uL (ref 0.7–4.0)
MCH: 31.8 pg (ref 26.0–34.0)
MCHC: 32.3 g/dL (ref 30.0–36.0)
MCV: 98.6 fL (ref 78.0–100.0)
Monocytes Absolute: 0.5 10*3/uL (ref 0.1–1.0)
Monocytes Relative: 6 % (ref 3–12)
Neutro Abs: 5.8 10*3/uL (ref 1.7–7.7)
Neutrophils Relative %: 68 % (ref 43–77)
Platelets: 227 10*3/uL (ref 150–400)
RBC: 3.52 MIL/uL — ABNORMAL LOW (ref 3.87–5.11)
RDW: 14.6 % (ref 11.5–15.5)
WBC: 8.4 10*3/uL (ref 4.0–10.5)

## 2015-05-26 LAB — GLUCOSE, CAPILLARY
Glucose-Capillary: 82 mg/dL (ref 65–99)
Glucose-Capillary: 110 mg/dL — ABNORMAL HIGH (ref 65–99)
Glucose-Capillary: 32 mg/dL — CL (ref 65–99)
Glucose-Capillary: 137 mg/dL — ABNORMAL HIGH (ref 65–99)
Glucose-Capillary: 147 mg/dL — ABNORMAL HIGH (ref 65–99)
Glucose-Capillary: 150 mg/dL — ABNORMAL HIGH (ref 65–99)

## 2015-05-26 LAB — COMPREHENSIVE METABOLIC PANEL
ALT: 12 U/L — ABNORMAL LOW (ref 14–54)
AST: 15 U/L (ref 15–41)
Albumin: 3.6 g/dL (ref 3.5–5.0)
Alkaline Phosphatase: 80 U/L (ref 38–126)
Anion gap: 10 (ref 5–15)
BUN: 43 mg/dL — ABNORMAL HIGH (ref 6–20)
CO2: 31 mmol/L (ref 22–32)
Calcium: 9.1 mg/dL (ref 8.9–10.3)
Chloride: 99 mmol/L — ABNORMAL LOW (ref 101–111)
Creatinine, Ser: 9.61 mg/dL — ABNORMAL HIGH (ref 0.44–1.00)
GFR calc Af Amer: 5 mL/min — ABNORMAL LOW (ref >60)
GFR calc non Af Amer: 4 mL/min — ABNORMAL LOW (ref >60)
Glucose, Bld: 46 mg/dL — ABNORMAL LOW (ref 65–99)
Potassium: 4.7 mmol/L (ref 3.5–5.1)
Sodium: 140 mmol/L (ref 135–145)
Total Bilirubin: 0.1 mg/dL — ABNORMAL LOW (ref 0.3–1.2)
Total Protein: 7.5 g/dL (ref 6.5–8.1)

## 2015-05-26 LAB — HCG, SERUM, QUALITATIVE: Preg, Serum: NEGATIVE

## 2015-05-26 LAB — HEMOGLOBIN AND HEMATOCRIT, BLOOD
HCT: 31.5 % — ABNORMAL LOW (ref 36.0–46.0)
Hemoglobin: 10.3 g/dL — ABNORMAL LOW (ref 12.0–15.0)

## 2015-05-26 SURGERY — GASTRECTOMY, SLEEVE, LAPAROSCOPIC
Anesthesia: General

## 2015-05-26 MED ORDER — ACETAMINOPHEN 160 MG/5ML PO SOLN
325.0000 mg | ORAL | Status: DC | PRN
  Filled 2015-05-26: qty 20.3

## 2015-05-26 MED ORDER — LIDOCAINE HCL (CARDIAC) 20 MG/ML IV SOLN
INTRAVENOUS | Status: AC
  Filled 2015-05-26: qty 5

## 2015-05-26 MED ORDER — UNJURY CHOCOLATE CLASSIC POWDER
2.0000 [oz_av] | Freq: Four times a day (QID) | ORAL | Status: DC
  Filled 2015-05-26 (×4): qty 27

## 2015-05-26 MED ORDER — FENTANYL CITRATE (PF) 100 MCG/2ML IJ SOLN
INTRAMUSCULAR | Status: DC | PRN
  Administered 2015-05-26 (×2): 50 ug via INTRAVENOUS
  Administered 2015-05-26 (×2): 100 ug via INTRAVENOUS
  Administered 2015-05-26: 50 ug via INTRAVENOUS

## 2015-05-26 MED ORDER — ENOXAPARIN SODIUM 30 MG/0.3ML ~~LOC~~ SOLN
30.0000 mg | Freq: Two times a day (BID) | SUBCUTANEOUS | Status: DC
  Administered 2015-05-27 – 2015-05-28 (×3): 30 mg via SUBCUTANEOUS
  Filled 2015-05-26 (×5): qty 0.3

## 2015-05-26 MED ORDER — MIDAZOLAM HCL 2 MG/2ML IJ SOLN
INTRAMUSCULAR | Status: AC
  Filled 2015-05-26: qty 4

## 2015-05-26 MED ORDER — HEPARIN SODIUM (PORCINE) 5000 UNIT/ML IJ SOLN
5000.0000 [IU] | INTRAMUSCULAR | Status: AC
  Administered 2015-05-26: 5000 [IU] via SUBCUTANEOUS
  Filled 2015-05-26: qty 1

## 2015-05-26 MED ORDER — ONDANSETRON HCL 4 MG/2ML IJ SOLN
INTRAMUSCULAR | Status: DC | PRN
  Administered 2015-05-26: 4 mg via INTRAVENOUS

## 2015-05-26 MED ORDER — EPHEDRINE SULFATE 50 MG/ML IJ SOLN
INTRAMUSCULAR | Status: DC | PRN
  Administered 2015-05-26: 10 mg via INTRAVENOUS

## 2015-05-26 MED ORDER — BUPIVACAINE-EPINEPHRINE (PF) 0.25% -1:200000 IJ SOLN
INTRAMUSCULAR | Status: AC
  Filled 2015-05-26: qty 30

## 2015-05-26 MED ORDER — SODIUM CHLORIDE 0.9 % IJ SOLN
INTRAMUSCULAR | Status: DC | PRN
  Administered 2015-05-26: 20 mL

## 2015-05-26 MED ORDER — CETYLPYRIDINIUM CHLORIDE 0.05 % MT LIQD
7.0000 mL | Freq: Two times a day (BID) | OROMUCOSAL | Status: DC
  Administered 2015-05-26 – 2015-05-28 (×3): 7 mL via OROMUCOSAL

## 2015-05-26 MED ORDER — BUPIVACAINE LIPOSOME 1.3 % IJ SUSP
20.0000 mL | Freq: Once | INTRAMUSCULAR | Status: AC
  Administered 2015-05-26: 20 mL
  Filled 2015-05-26: qty 20

## 2015-05-26 MED ORDER — OXYCODONE HCL 5 MG/5ML PO SOLN
5.0000 mg | ORAL | Status: DC | PRN
  Administered 2015-05-28: 10 mg via ORAL
  Filled 2015-05-26: qty 10

## 2015-05-26 MED ORDER — FENTANYL CITRATE (PF) 250 MCG/5ML IJ SOLN
INTRAMUSCULAR | Status: AC
  Filled 2015-05-26: qty 25

## 2015-05-26 MED ORDER — 0.9 % SODIUM CHLORIDE (POUR BTL) OPTIME
TOPICAL | Status: DC | PRN
  Administered 2015-05-26: 1000 mL

## 2015-05-26 MED ORDER — PANTOPRAZOLE SODIUM 40 MG IV SOLR
40.0000 mg | Freq: Every day | INTRAVENOUS | Status: DC
  Administered 2015-05-26 – 2015-05-27 (×2): 40 mg via INTRAVENOUS
  Filled 2015-05-26 (×3): qty 40

## 2015-05-26 MED ORDER — ONDANSETRON HCL 4 MG/2ML IJ SOLN: 4.0000 mg | Freq: Once | INTRAMUSCULAR | Status: DC | PRN

## 2015-05-26 MED ORDER — UNJURY CHICKEN SOUP POWDER
2.0000 [oz_av] | Freq: Four times a day (QID) | ORAL | Status: DC
  Administered 2015-05-28 (×3): 2 [oz_av] via ORAL
  Filled 2015-05-26 (×5): qty 27

## 2015-05-26 MED ORDER — MORPHINE SULFATE (PF) 10 MG/ML IV SOLN
2.0000 mg | INTRAVENOUS | Status: DC | PRN
  Administered 2015-05-26 (×2): 6 mg via INTRAVENOUS
  Administered 2015-05-26: 4 mg via INTRAVENOUS
  Administered 2015-05-26: 2 mg via INTRAVENOUS
  Administered 2015-05-27: 6 mg via INTRAVENOUS
  Filled 2015-05-26 (×4): qty 1

## 2015-05-26 MED ORDER — CISATRACURIUM 2MG/ML (10ML) SYRINGE FOR MED FUSION PUMP - OPTIME
INTRAVENOUS | Status: DC | PRN
  Administered 2015-05-26: 8 mg via INTRAVENOUS
  Administered 2015-05-26: 2 mg via INTRAVENOUS

## 2015-05-26 MED ORDER — ONDANSETRON HCL 4 MG/2ML IJ SOLN
4.0000 mg | Freq: Once | INTRAMUSCULAR | Status: AC
  Administered 2015-05-26: 4 mg via INTRAVENOUS

## 2015-05-26 MED ORDER — DEXTROSE 50 % IV SOLN
25.0000 g | Freq: Once | INTRAVENOUS | Status: AC
  Administered 2015-05-26: 25 g via INTRAVENOUS

## 2015-05-26 MED ORDER — CHLORHEXIDINE GLUCONATE CLOTH 2 % EX PADS: 6.0000 | MEDICATED_PAD | Freq: Once | CUTANEOUS | Status: DC

## 2015-05-26 MED ORDER — GLYCOPYRROLATE 0.2 MG/ML IJ SOLN
INTRAMUSCULAR | Status: DC | PRN
  Administered 2015-05-26: .8 mg via INTRAVENOUS

## 2015-05-26 MED ORDER — PHENYLEPHRINE HCL 10 MG/ML IJ SOLN
INTRAMUSCULAR | Status: DC | PRN
  Administered 2015-05-26 (×2): 80 ug via INTRAVENOUS
  Administered 2015-05-26: 120 ug via INTRAVENOUS
  Administered 2015-05-26: 100 ug via INTRAVENOUS

## 2015-05-26 MED ORDER — SODIUM CHLORIDE 0.9 % IV SOLN
INTRAVENOUS | Status: DC
  Administered 2015-05-26: 1000 mL via INTRAVENOUS

## 2015-05-26 MED ORDER — CEFOXITIN SODIUM 2 G IV SOLR
INTRAVENOUS | Status: AC
  Filled 2015-05-26: qty 2

## 2015-05-26 MED ORDER — LACTATED RINGERS IR SOLN
Status: DC | PRN
  Administered 2015-05-26: 1

## 2015-05-26 MED ORDER — LACTATED RINGERS IV SOLN
INTRAVENOUS | Status: DC | PRN
  Administered 2015-05-26: 13:00:00 via INTRAVENOUS

## 2015-05-26 MED ORDER — NEOSTIGMINE METHYLSULFATE 10 MG/10ML IV SOLN
INTRAVENOUS | Status: DC | PRN
  Administered 2015-05-26: 5 mg via INTRAVENOUS

## 2015-05-26 MED ORDER — SUCCINYLCHOLINE CHLORIDE 20 MG/ML IJ SOLN
INTRAMUSCULAR | Status: DC | PRN
  Administered 2015-05-26: 100 mg via INTRAVENOUS

## 2015-05-26 MED ORDER — TISSEEL VH 10 ML EX KIT
PACK | CUTANEOUS | Status: AC
  Filled 2015-05-26: qty 1

## 2015-05-26 MED ORDER — TISSEEL VH 10 ML EX KIT
PACK | CUTANEOUS | Status: DC | PRN
  Administered 2015-05-26: 1

## 2015-05-26 MED ORDER — PROPOFOL 10 MG/ML IV BOLUS
INTRAVENOUS | Status: DC | PRN
  Administered 2015-05-26: 200 mg via INTRAVENOUS

## 2015-05-26 MED ORDER — PROPOFOL 10 MG/ML IV BOLUS
INTRAVENOUS | Status: AC
  Filled 2015-05-26: qty 20

## 2015-05-26 MED ORDER — SODIUM CHLORIDE 0.9 % IV SOLN
INTRAVENOUS | Status: DC
  Administered 2015-05-26 – 2015-05-27 (×3): via INTRAVENOUS

## 2015-05-26 MED ORDER — LEVOTHYROXINE SODIUM 100 MCG IV SOLR
25.0000 ug | Freq: Every day | INTRAVENOUS | Status: DC
  Administered 2015-05-28: 25 ug via INTRAVENOUS
  Filled 2015-05-26 (×3): qty 5

## 2015-05-26 MED ORDER — DEXTROSE 5 % IV SOLN
2.0000 g | INTRAVENOUS | Status: AC
  Administered 2015-05-26: 2 g via INTRAVENOUS

## 2015-05-26 MED ORDER — INSULIN GLARGINE 100 UNIT/ML ~~LOC~~ SOLN
15.0000 [IU] | Freq: Every day | SUBCUTANEOUS | Status: DC
  Administered 2015-05-26 – 2015-05-27 (×2): 15 [IU] via SUBCUTANEOUS
  Filled 2015-05-26 (×3): qty 0.15

## 2015-05-26 MED ORDER — UNJURY VANILLA POWDER
2.0000 [oz_av] | Freq: Four times a day (QID) | ORAL | Status: DC
  Filled 2015-05-26 (×4): qty 27

## 2015-05-26 MED ORDER — ONDANSETRON HCL 4 MG/2ML IJ SOLN: 4.0000 mg | INTRAMUSCULAR | Status: DC | PRN

## 2015-05-26 MED ORDER — ONDANSETRON HCL 4 MG/2ML IJ SOLN
INTRAMUSCULAR | Status: AC
  Filled 2015-05-26: qty 2

## 2015-05-26 MED ORDER — MIDAZOLAM HCL 5 MG/5ML IJ SOLN
INTRAMUSCULAR | Status: DC | PRN
  Administered 2015-05-26: 2 mg via INTRAVENOUS

## 2015-05-26 MED ORDER — INSULIN ASPART 100 UNIT/ML ~~LOC~~ SOLN
0.0000 [IU] | SUBCUTANEOUS | Status: DC
  Administered 2015-05-26 – 2015-05-28 (×3): 2 [IU] via SUBCUTANEOUS

## 2015-05-26 MED ORDER — SODIUM CHLORIDE 0.9 % IJ SOLN
INTRAMUSCULAR | Status: AC
  Filled 2015-05-26: qty 20

## 2015-05-26 MED ORDER — DEXTROSE 50 % IV SOLN
INTRAVENOUS | Status: AC
  Filled 2015-05-26: qty 50

## 2015-05-26 MED ORDER — FENTANYL CITRATE (PF) 100 MCG/2ML IJ SOLN: 25.0000 ug | INTRAMUSCULAR | Status: DC | PRN

## 2015-05-26 MED ORDER — LIDOCAINE HCL (CARDIAC) 20 MG/ML IV SOLN
INTRAVENOUS | Status: DC | PRN
  Administered 2015-05-26: 50 mg via INTRAVENOUS

## 2015-05-26 MED ORDER — BUPIVACAINE-EPINEPHRINE (PF) 0.25% -1:200000 IJ SOLN
INTRAMUSCULAR | Status: DC | PRN
  Administered 2015-05-26: 29 mL via PERINEURAL

## 2015-05-26 MED ORDER — ACETAMINOPHEN 160 MG/5ML PO SOLN
650.0000 mg | ORAL | Status: DC | PRN
  Filled 2015-05-26: qty 20.3

## 2015-05-26 SURGICAL SUPPLY — 63 items
APL SRG 32X5 SNPLK LF DISP (MISCELLANEOUS) ×1
APPLICATOR COTTON TIP 6IN STRL (MISCELLANEOUS) IMPLANT
APPLIER CLIP ROT 10 11.4 M/L (STAPLE) ×3
APPLIER CLIP ROT 13.4 12 LRG (CLIP)
APR CLP LRG 13.4X12 ROT 20 MLT (CLIP)
APR CLP MED LRG 11.4X10 (STAPLE) ×1
BAG SPEC RTRVL LRG 6X4 10 (ENDOMECHANICALS)
BLADE SURG SZ11 CARB STEEL (BLADE) ×3 IMPLANT
CABLE HIGH FREQUENCY MONO STRZ (ELECTRODE) IMPLANT
CHLORAPREP W/TINT 26ML (MISCELLANEOUS) ×3 IMPLANT
CLIP APPLIE ROT 10 11.4 M/L (STAPLE) IMPLANT
CLIP APPLIE ROT 13.4 12 LRG (CLIP) IMPLANT
COVER SURGICAL LIGHT HANDLE (MISCELLANEOUS) ×3 IMPLANT
DEVICE SUTURE ENDOST 10MM (ENDOMECHANICALS) IMPLANT
DEVICE TROCAR PUNCTURE CLOSURE (ENDOMECHANICALS) ×3 IMPLANT
DRAPE CAMERA CLOSED 9X96 (DRAPES) ×3 IMPLANT
DRAPE UTILITY XL STRL (DRAPES) ×6 IMPLANT
ELECT REM PT RETURN 9FT ADLT (ELECTROSURGICAL) ×3
ELECTRODE REM PT RTRN 9FT ADLT (ELECTROSURGICAL) ×1 IMPLANT
GAUZE SPONGE 4X4 12PLY STRL (GAUZE/BANDAGES/DRESSINGS) IMPLANT
GLOVE BIOGEL PI IND STRL 7.5 (GLOVE) ×1 IMPLANT
GLOVE BIOGEL PI INDICATOR 7.5 (GLOVE) ×2
GLOVE ECLIPSE 7.5 STRL STRAW (GLOVE) ×3 IMPLANT
GOWN STRL REUS W/TWL XL LVL3 (GOWN DISPOSABLE) ×12 IMPLANT
HOVERMATT SINGLE USE (MISCELLANEOUS) ×3 IMPLANT
KIT BASIN OR (CUSTOM PROCEDURE TRAY) ×3 IMPLANT
LIQUID BAND (GAUZE/BANDAGES/DRESSINGS) ×3 IMPLANT
NDL SPNL 22GX3.5 QUINCKE BK (NEEDLE) ×1 IMPLANT
NEEDLE SPNL 22GX3.5 QUINCKE BK (NEEDLE) ×3 IMPLANT
PACK UNIVERSAL I (CUSTOM PROCEDURE TRAY) ×3 IMPLANT
PEN SKIN MARKING BROAD (MISCELLANEOUS) ×3 IMPLANT
POUCH SPECIMEN RETRIEVAL 10MM (ENDOMECHANICALS) IMPLANT
RELOAD STAPLE 60 3.6 BLU REG (STAPLE) ×1 IMPLANT
RELOAD STAPLE 60 3.8 GOLD REG (STAPLE) ×1 IMPLANT
RELOAD STAPLE 60 4.1 GRN THCK (STAPLE) ×1 IMPLANT
RELOAD STAPLER BLUE 60MM (STAPLE) ×3 IMPLANT
RELOAD STAPLER GOLD 60MM (STAPLE) ×2 IMPLANT
RELOAD STAPLER GREEN 60MM (STAPLE) ×1 IMPLANT
SCISSORS LAP 5X45 EPIX DISP (ENDOMECHANICALS) IMPLANT
SEALANT SURGICAL APPL DUAL CAN (MISCELLANEOUS) ×3 IMPLANT
SET IRRIG TUBING LAPAROSCOPIC (IRRIGATION / IRRIGATOR) ×3 IMPLANT
SHEARS CURVED HARMONIC AC 45CM (MISCELLANEOUS) ×3 IMPLANT
SLEEVE ADV FIXATION 5X100MM (TROCAR) ×3 IMPLANT
SLEEVE GASTRECTOMY 36FR VISIGI (MISCELLANEOUS) ×3 IMPLANT
SOLUTION ANTI FOG 6CC (MISCELLANEOUS) ×3 IMPLANT
SPONGE LAP 18X18 X RAY DECT (DISPOSABLE) ×3 IMPLANT
STAPLER ECHELON LONG 60 440 (INSTRUMENTS) ×3 IMPLANT
STAPLER RELOAD BLUE 60MM (STAPLE) ×9
STAPLER RELOAD GOLD 60MM (STAPLE) ×6
STAPLER RELOAD GREEN 60MM (STAPLE) ×3
SUT MNCRL AB 4-0 PS2 18 (SUTURE) ×3 IMPLANT
SUT VICRYL 0 TIES 12 18 (SUTURE) ×3 IMPLANT
SYR 10ML ECCENTRIC (SYRINGE) ×3 IMPLANT
SYR 20CC LL (SYRINGE) ×3 IMPLANT
TOWEL OR 17X26 10 PK STRL BLUE (TOWEL DISPOSABLE) ×3 IMPLANT
TOWEL OR NON WOVEN STRL DISP B (DISPOSABLE) ×3 IMPLANT
TROCAR ADV FIXATION 5X100MM (TROCAR) ×3 IMPLANT
TROCAR BLADELESS 15MM (ENDOMECHANICALS) ×3 IMPLANT
TROCAR XCEL NON-BLD 5MMX100MML (ENDOMECHANICALS) ×3 IMPLANT
TUBING CONNECTING 10 (TUBING) ×2 IMPLANT
TUBING CONNECTING 10' (TUBING) ×1
TUBING ENDO SMARTCAP PENTAX (MISCELLANEOUS) ×3 IMPLANT
TUBING FILTER THERMOFLATOR (ELECTROSURGICAL) ×3 IMPLANT

## 2015-05-26 NOTE — Progress Notes (Signed)
Utilization review completed.  

## 2015-05-26 NOTE — Op Note (Signed)
Preoperative diagnosis: laparoscopic sleeve gastrectomy  Postoperative diagnosis: Same   Procedure: Upper endoscopy   Surgeon: Luke Kinsinger, M.D.  Anesthesia: Gen.   Indications for procedure: This patient was undergoing a laparoscopic sleeve gastrectomy.   Description of procedure: The endoscopy was placed in the mouth and into the oropharynx and under endoscopic vision it was advanced to the esophagogastric junction. The pouch was insufflated and no bleeding or bubbles were seen. The GEJ was identified at 36cm from the teeth. No bleeding or leaks were detected. The scope was withdrawn without difficulty.   Luke Kinsinger, M.D. General, Bariatric, & Minimally Invasive Surgery [facility]    

## 2015-05-26 NOTE — Anesthesia Preprocedure Evaluation (Addendum)
Anesthesia Evaluation  Patient identified by MRN, date of birth, ID band Patient awake    Reviewed: Allergy & Precautions, NPO status , Patient's Chart, lab work & pertinent test results  History of Anesthesia Complications Negative for: history of anesthetic complications  Airway Mallampati: II  TM Distance: >3 FB Neck ROM: Full    Dental  (+) Dental Advisory Given, Poor Dentition   Pulmonary sleep apnea ,  breath sounds clear to auscultation  Pulmonary exam normal       Cardiovascular Exercise Tolerance: Good hypertension, Pt. on medications + Peripheral Vascular Disease Normal cardiovascular examRhythm:Regular Rate:Normal     Neuro/Psych  Headaches, Diabetic neuropathy  Neuromuscular disease    GI/Hepatic Neg liver ROS, GERD-  ,  Endo/Other  negative endocrine ROSdiabetes, Type 2, Insulin DependentHypothyroidism   Renal/GU Dialysis and ESRFRenal diseaseS/p failed kidney transplant.  Dialysis last on 05/25/15. Preop K of 4.7     Musculoskeletal  (+) Arthritis -,   Abdominal   Peds  Hematology negative hematology ROS (+) Blood dyscrasia, anemia ,   Anesthesia Other Findings Day of surgery medications reviewed with the patient.  Reproductive/Obstetrics                          Anesthesia Physical Anesthesia Plan  ASA: IV  Anesthesia Plan: General   Post-op Pain Management:    Induction: Intravenous  Airway Management Planned: Oral ETT  Additional Equipment:   Intra-op Plan:   Post-operative Plan: Extubation in OR  Informed Consent: I have reviewed the patients History and Physical, chart, labs and discussed the procedure including the risks, benefits and alternatives for the proposed anesthesia with the patient or authorized representative who has indicated his/her understanding and acceptance.   Dental advisory given  Plan Discussed with: CRNA  Anesthesia Plan Comments:  (Risks/benefits of general anesthesia discussed with patient including risk of damage to teeth, lips, gum, and tongue, nausea/vomiting, allergic reactions to medications, and the possibility of heart attack, stroke and death.  All patient questions answered.  Patient wishes to proceed.)        Anesthesia Quick Evaluation

## 2015-05-26 NOTE — Anesthesia Postprocedure Evaluation (Signed)
  Anesthesia Post-op Note  Patient: Wanda [name]  Procedure(s) Performed: Procedure(s) (LRB): LAPAROSCOPIC GASTRIC SLEEVE RESECTION (N/A)  Patient Location: PACU  Anesthesia Type: General  Level of Consciousness: awake and alert   Airway and Oxygen Therapy: Patient Spontanous Breathing  Post-op Pain: mild  Post-op Assessment: Post-op Vital signs reviewed, Patient's Cardiovascular Status Stable, Respiratory Function Stable, Patent Airway and No signs of Nausea or vomiting  Last Vitals:  Filed Vitals:   05/26/15 1940  BP: 172/79  Pulse: 103  Temp: 36.7 C  Resp: 18    Post-op Vital Signs: stable   Complications: No apparent anesthesia complications

## 2015-05-26 NOTE — Transfer of Care (Signed)
Immediate Anesthesia Transfer of Care Note  Patient: Wanda [name]  Procedure(s) Performed: Procedure(s): LAPAROSCOPIC GASTRIC SLEEVE RESECTION (N/A)  Patient Location: PACU  Anesthesia Type:General  Level of Consciousness: sedated, patient cooperative and responds to stimulation  Airway & Oxygen Therapy: Patient Spontanous Breathing and Patient connected to face mask oxygen  Post-op Assessment: Report given to RN and Post -op Vital signs reviewed and stable  Post vital signs: Reviewed and stable  Last Vitals:  Filed Vitals:   05/26/15 1511  BP:   Pulse: 93  Temp:   Resp:     Complications: No apparent anesthesia complications

## 2015-05-26 NOTE — Op Note (Signed)
Preoperative Diagnosis: Morbid Obesity  Postoprative Diagnosis: Morbid Obesity  Procedure: Procedure(s): LAPAROSCOPIC GASTRIC SLEEVE RESECTION   Surgeon: Excell Seltzer T   Assistants: Gurney Maxin  Anesthesia:  General endotracheal anesthesia  Indications: patient is a 54 year old female with multiple comorbidities and morbid obesity unresponsive to medical management with a BMI of 38. After extensive preoperative workup and discussion detailed elsewhere we have elected proceed with laparoscopic sleeve gastrectomy for treatment of her morbid obesity.  Procedure Detail: Patient was brought to the operating room, placed in supine position on the operating table, and general endotracheal anesthesia induced. She had received preoperative subcutaneous heparin and IV antibiotics. PAS were in place. The abdomen was widely sterilely prepped and draped. Patient timeout was performed and correct procedure verified. Access was obtained. The 5 mm Optiview trocar in the left upper quadrant without difficulty and pneumoperitoneum established. Under direct vision a 5 mm trocar was placed laterally in the right upper quadrant, a 15 mm trocar at the base of the falciform ligament, a 5 mm trocar just above and to the left of the umbilicus for the camera port and a 5 mm trocar in the left lateral abdomen. Through a 5 mm subxiphoid site the next retractor was placed in the left lobe of the liver elevated with excellent exposure of the stomach and the hiatus. We carefully measured 5 cm from the pylorus and beginning at this point the lesser omentum was taken down off the greater curve with the Harmonic scalpel and the lesser sac easily entered. Dissection progressed proximally and short gastric vessels were divided with the Harmonic scalpel working up toward the fundus. The stomach was mobilized away from the spleen dividing all short gastrics. The left crus was completely dissected free and the fundus fully  mobilized up to the angle of Hiss. This point the stomach and been completely mobilized along its lesser curve vasculature. The VisiG gastric tube was advanced orally into the stomach and advanced with its tip to the pylorus then positioned along the lesser curve and suction applied with the stomach held out symmetrically. An additional firing of the echelon 60 mm green load stapler was performed to 5 cm from the pylorus up toward staying away from the incisura. A second firing of the cold load 60 mm stapler was used continuing the staple line pass the incisura but staying off of the gastric tube this point to allow a little extra room. Following this several firings of the 60 mm blue load stapler were performed along side of the gastric tube working up toward the hiatus with a final firing completing the sleeve going a little bit laterally to the esophageal fat pad. The sleeve appeared very symmetrical with no narrowing and no bleeding from the staple line. It was insufflated with the VisiG tube under saline irrigation was evidence of leak. Following this upper endoscopy was performed but again no evidence of leak under insufflation, a symmetrical appearing sleeve without narrowing and no bleeding. Staple lines were coated with Tisseel tissue sealant. The abdomen was inspected for hemostasis. Trocar sites were infiltrated with Exparel local anesthesia. All trochars were removed and CO2 evacuated and the Nathanson retractor removed under direct vision. Skin incisions were closed with subcuticular Monocryl and Dermabond. Sponge needle and instrument counts were correct.    Findings: As above  Estimated Blood Loss:  Minimal         Drains: none  Blood Given: none          Specimens: greater curvature  of stomach        Complications:  * No complications entered in OR log *         Disposition: PACU - hemodynamically stable.         Condition: stable

## 2015-05-26 NOTE — H&P (Signed)
History of Present Illness Wanda [name] [name] MD; 05/22/2015 2:41 PM) The patient is a 54 year old female who presents for a bariatric surgery evaluation. She returns for a preop check prior to planned laparoscopic sleeve gastrectomy. She was referred by Dr Deterding for consideration for surgical treatment for morbid obesity. The patient gives a history of progressive obesity since early adulthood despite multiple attempts at medical management. He has been able to lose up to 100 pounds at a time with Fen/Phen but experienced progressive weight regain. Obesity has been affecting the patient in a number of ways. She developed adult onset diabetes mellitus and hypertension and developed end-stage renal disease approximately 2013. 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 She is now back on dialysis. She is on the transplant list at [facility] and followed by Dr. Lissa Merlin on the transplant service there. She says that both he and her nephrologist here indicate that she is much more likely to have a successful transplant if she can get down to a healthier weight and get her diabetes under better control. She takes about 120 units of insulin total daily. She has some shortness of breath with exertion and difficulty with daily tasks although generally is very functional..  She has successfully completed her preoperative workup. No concerns on psych her nutrition evaluation. Lab work is remarkable only for known issues of renal failure, diabetes with elevated glucose and hemoglobin A1c and elevated triglycerides. We have a note from her transplant nephrologist at [facility] who is in agreement with the plan.   Problem List/Past Medical Edward Jolly, MD; 05/22/2015 2:41 PM) MORBID OBESITY (278.01  E66.01)  Other Problems Edward Jolly, MD; 05/22/2015 2:41 PM) Thyroid Disease Diabetes Mellitus Chronic Renal Failure Syndrome High blood pressure Gastroesophageal Reflux  Disease  Past Surgical History Edward Jolly, MD; 05/22/2015 2:41 PM) Dialysis Shunt / Fistula Colon Polyp Removal - Colonoscopy Nephrectomy Right. Gallbladder Surgery - Laparoscopic Cesarean Section - Multiple Cataract Surgery Bilateral. Oral Surgery  Diagnostic Studies History Edward Jolly, MD; 05/22/2015 2:41 PM) Mammogram within last year Colonoscopy 1-5 years ago  Allergies Elbert Ewings, CMA; 05/22/2015 2:03 PM) Vicodin *ANALGESICS - OPIOID* Darvocet-N 100 *ANALGESICS - OPIOID*  Medication History Elbert Ewings, CMA; 05/22/2015 2:03 PM) Lyrica (100MG  Capsule, Oral) Active. Lyrica (25MG  Capsule, Oral) Active. Allopurinol (300MG  Tablet, Oral) Active. Colchicine (0.6MG  Tablet, Oral) Active. Furosemide (80MG  Tablet, Oral) Active. Lantus SoloStar (100UNIT/ML Soln Pen-inj, Subcutaneous) Active. Levothyroxine Sodium (100MCG Tablet, Oral daily) Active. NovoLOG FlexPen (100UNIT/ML Soln Pen-inj, Subcutaneous) Active. Ondansetron HCl (4MG  Tablet, Oral as needed) Active. Renvela (800MG  Tablet, Oral) Active. Vitamin D (50000U Capsule, Oral monthly) Active. Aspirin Childrens (81MG  Tablet Chewable, Oral daily) Active. Medications Reconciled  Social History Edward Jolly, MD; 05/22/2015 2:41 PM) Caffeine use Coffee. No drug use No alcohol use Tobacco use Never smoker.  Family History Edward Jolly, MD; 05/22/2015 2:41 PM) Thyroid problems Mother. Hypertension Son. Diabetes Mellitus Brother. Respiratory Condition Father. Rectal Cancer Father. Arthritis Mother. Colon Polyps Father. Breast Cancer Mother.  Pregnancy / Birth History Edward Jolly, MD; 05/22/2015 2:41 PM) Para 3 Age at menarche 56 years. Maternal age 52-25 Gravida 3  Vitals Elbert Ewings CMA; 05/22/2015 2:03 PM) 05/22/2015 2:03 PM Weight: 264 lb Height: 69in Body Surface Area: 2.41 m Body Mass Index: 38.99 kg/m Temp.: 97.39F(Oral)   Pulse: 113 (Regular)  Resp.: 17 (Unlabored)  BP: 138/82 (Sitting, Left Arm, Standard)    Physical Exam Wanda [name] T. [name]; 05/22/2015 2:42 PM) The physical exam  findings are as follows: Note:General: Alert, obese African-American female, in no distress Skin: Warm and dry without rash or infection. HEENT: No palpable masses or thyromegaly. Sclera nonicteric. Lungs: Breath sounds clear and equal. No wheezing or increased work of breathing. Cardiovascular: Regular rate and rhythm without murmer. 1-2+ lower extremity edema. Abdomen: Nondistended. Soft and nontender. No masses palpable. No organomegaly. No palpable hernias. Well-healed laparoscopic incisions Extremities: 1-2+ lower extremity edema. No chronic venous stasis changes. Neurologic: Alert and fully oriented. Gait normal. No focal weakness. Psychiatric: Normal mood and affect. Thought content appropriate with normal judgement and insight    Assessment & Plan Wanda [name] T. [name]; 05/22/2015 2:47 PM) MORBID OBESITY (278.01  E66.01) Impression: Patient with progressive morbid obesity unresponsive to multiple efforts at medical management who presents with a BMI of 37.8 and comorbidities of Insulin-dependent diabetes mellitus, joint pain and previous hypertension although off medications. She has end-stage renal disease and is felt to be a better transplant candidate at a healthier weight according to her transplant physician.. I believe there would be very significant medical benefit from surgical weight loss. Ready to proceed with laparoscopic sleeve gastrectomy as planned. She will dialyze the day before surgery. We reviewed the surgery and its nature again and she has been through this consent form and all her questions answered. She is given prescription for pain medication, Zofran and Protonix. Current Plans  Started OxyCODONE HCl 5MG /5ML, 5-10 Milliliter every four hours, as needed, 200 Milliliter, 05/22/2015, No  Refill. Started Protonix 20MG , 1 (one) Tablet DR daily, #30, 05/22/2015, Ref. x3. Started Zofran ODT 4MG , 1 (one) Tablet Disperse every four hours, as needed, #20, 05/22/2015, No Refill.

## 2015-05-26 NOTE — Progress Notes (Signed)
Rt upper arm has +thrill and bruit.

## 2015-05-26 NOTE — Interval H&P Note (Signed)
History and Physical Interval Note:  05/26/2015 12:09 PM  Wanda [name]  has presented today for surgery, with the diagnosis of Morbid Obesity  The various methods of treatment have been discussed with the patient and family. After consideration of risks, benefits and other options for treatment, the patient has consented to  Procedure(s): LAPAROSCOPIC GASTRIC SLEEVE RESECTION (N/A) as a surgical intervention .  The patient's history has been reviewed, patient examined, no change in status, stable for surgery.  I have reviewed the patient's chart and labs.  Questions were answered to the patient's satisfaction.     Nefi Musich T

## 2015-05-26 NOTE — Anesthesia Procedure Notes (Signed)
Procedure Name: Intubation Performed by: Daniesha Driver J Pre-anesthesia Checklist: Patient identified, Emergency Drugs available, Suction available, Patient being monitored and Timeout performed Patient Re-evaluated:Patient Re-evaluated prior to inductionOxygen Delivery Method: Circle system utilized Preoxygenation: Pre-oxygenation with 100% oxygen Intubation Type: IV induction Ventilation: Mask ventilation without difficulty Laryngoscope Size: Mac and 4 Grade View: Grade I Tube type: Oral Tube size: 7.0 mm Number of attempts: 1 Airway Equipment and Method: Stylet Placement Confirmation: ETT inserted through vocal cords under direct vision,  positive ETCO2,  CO2 detector and breath sounds checked- equal and bilateral Secured at: 22 cm Tube secured with: Tape Dental Injury: Teeth and Oropharynx as per pre-operative assessment        

## 2015-05-27 ENCOUNTER — Encounter (HOSPITAL_COMMUNITY): Payer: Self-pay | Admitting: General Surgery

## 2015-05-27 LAB — BASIC METABOLIC PANEL
Anion gap: 13 (ref 5–15)
BUN: 46 mg/dL — ABNORMAL HIGH (ref 6–20)
CO2: 26 mmol/L (ref 22–32)
Calcium: 8.5 mg/dL — ABNORMAL LOW (ref 8.9–10.3)
Chloride: 99 mmol/L — ABNORMAL LOW (ref 101–111)
Creatinine, Ser: 11.68 mg/dL — ABNORMAL HIGH (ref 0.44–1.00)
GFR calc Af Amer: 4 mL/min — ABNORMAL LOW (ref >60)
GFR calc non Af Amer: 3 mL/min — ABNORMAL LOW (ref >60)
Glucose, Bld: 116 mg/dL — ABNORMAL HIGH (ref 65–99)
Potassium: 5.7 mmol/L — ABNORMAL HIGH (ref 3.5–5.1)
Sodium: 138 mmol/L (ref 135–145)

## 2015-05-27 LAB — CBC WITH DIFFERENTIAL/PLATELET
Basophils Absolute: 0 10*3/uL (ref 0.0–0.1)
Basophils Relative: 0 % (ref 0–1)
Eosinophils Absolute: 0.2 10*3/uL (ref 0.0–0.7)
Eosinophils Relative: 2 % (ref 0–5)
HCT: 29.7 % — ABNORMAL LOW (ref 36.0–46.0)
Hemoglobin: 9.8 g/dL — ABNORMAL LOW (ref 12.0–15.0)
Lymphocytes Relative: 17 % (ref 12–46)
Lymphs Abs: 1.6 10*3/uL (ref 0.7–4.0)
MCH: 32.1 pg (ref 26.0–34.0)
MCHC: 33 g/dL (ref 30.0–36.0)
MCV: 97.4 fL (ref 78.0–100.0)
Monocytes Absolute: 0.6 10*3/uL (ref 0.1–1.0)
Monocytes Relative: 6 % (ref 3–12)
Neutro Abs: 7.1 10*3/uL (ref 1.7–7.7)
Neutrophils Relative %: 75 % (ref 43–77)
Platelets: 199 10*3/uL (ref 150–400)
RBC: 3.05 MIL/uL — ABNORMAL LOW (ref 3.87–5.11)
RDW: 14.6 % (ref 11.5–15.5)
WBC: 9.4 10*3/uL (ref 4.0–10.5)

## 2015-05-27 LAB — COMPREHENSIVE METABOLIC PANEL
ALT: 90 U/L — ABNORMAL HIGH (ref 14–54)
AST: 148 U/L — ABNORMAL HIGH (ref 15–41)
Albumin: 3 g/dL — ABNORMAL LOW (ref 3.5–5.0)
Alkaline Phosphatase: 119 U/L (ref 38–126)
Anion gap: 13 (ref 5–15)
BUN: 47 mg/dL — ABNORMAL HIGH (ref 6–20)
CO2: 25 mmol/L (ref 22–32)
Calcium: 8.3 mg/dL — ABNORMAL LOW (ref 8.9–10.3)
Chloride: 100 mmol/L — ABNORMAL LOW (ref 101–111)
Creatinine, Ser: 10.99 mg/dL — ABNORMAL HIGH (ref 0.44–1.00)
GFR calc Af Amer: 4 mL/min — ABNORMAL LOW (ref >60)
GFR calc non Af Amer: 3 mL/min — ABNORMAL LOW (ref >60)
Glucose, Bld: 85 mg/dL (ref 65–99)
Potassium: 6.3 mmol/L (ref 3.5–5.1)
Sodium: 138 mmol/L (ref 135–145)
Total Bilirubin: 0.8 mg/dL (ref 0.3–1.2)
Total Protein: 6.5 g/dL (ref 6.5–8.1)

## 2015-05-27 LAB — GLUCOSE, CAPILLARY
Glucose-Capillary: 100 mg/dL — ABNORMAL HIGH (ref 65–99)
Glucose-Capillary: 120 mg/dL — ABNORMAL HIGH (ref 65–99)
Glucose-Capillary: 128 mg/dL — ABNORMAL HIGH (ref 65–99)
Glucose-Capillary: 84 mg/dL (ref 65–99)

## 2015-05-27 MED ORDER — MORPHINE SULFATE (PF) 2 MG/ML IV SOLN
2.0000 mg | INTRAVENOUS | Status: DC | PRN
  Administered 2015-05-27 – 2015-05-28 (×4): 2 mg via INTRAVENOUS
  Filled 2015-05-27 (×3): qty 1

## 2015-05-27 MED ORDER — LIDOCAINE-PRILOCAINE 2.5-2.5 % EX CREA: 1.0000 "application " | TOPICAL_CREAM | CUTANEOUS | Status: DC | PRN

## 2015-05-27 MED ORDER — ALTEPLASE 2 MG IJ SOLR
2.0000 mg | Freq: Once | INTRAMUSCULAR | Status: DC | PRN
  Filled 2015-05-27: qty 2

## 2015-05-27 MED ORDER — MORPHINE SULFATE (PF) 2 MG/ML IV SOLN
INTRAVENOUS | Status: AC
  Filled 2015-05-27: qty 1

## 2015-05-27 MED ORDER — SODIUM CHLORIDE 0.9 % IV SOLN: 100.0000 mL | INTRAVENOUS | Status: DC | PRN

## 2015-05-27 MED ORDER — LIDOCAINE HCL (PF) 1 % IJ SOLN
5.0000 mL | INTRAMUSCULAR | Status: DC | PRN
  Filled 2015-05-27: qty 5

## 2015-05-27 MED ORDER — HEPARIN SODIUM (PORCINE) 1000 UNIT/ML DIALYSIS
1000.0000 [IU] | INTRAMUSCULAR | Status: DC | PRN
  Filled 2015-05-27: qty 1

## 2015-05-27 MED ORDER — NEPRO/CARBSTEADY PO LIQD
237.0000 mL | ORAL | Status: DC | PRN
  Filled 2015-05-27: qty 237

## 2015-05-27 MED ORDER — PENTAFLUOROPROP-TETRAFLUOROETH EX AERO: 1.0000 "application " | INHALATION_SPRAY | CUTANEOUS | Status: DC | PRN

## 2015-05-27 NOTE — Progress Notes (Signed)
1130: Hemodialysis- Pt arrived via [facility] from [facility]. No complaints, vs stable. For dialysis then transfer to room on 6N.

## 2015-05-27 NOTE — Consult Note (Signed)
[facility] Renal Consultation Note  Indication for Consultation:  Management of ESRD/hemodialysis; anemia, hypertension/volume and secondary hyperparathyroidism  HPI: Wanda [name] is a 54 y.o. female with ESRD (MWF [facility]), Obesity sp laparoscopic sleeve gastrectomy  at [facility]  yesterday ( wt loss requested from Renal [facility] at [facility])  K today 6.3 and requiring HD . No co on HD  Now.      Past Medical History  Diagnosis Date  . Hyperlipidemia   . Unspecified essential hypertension   . Shingles   . Obesity   . Hypothyroidism   . Diabetes mellitus type II   . Nonspecific abnormal unspecified cardiovascular function study   . Foot pain   . Renal transplant disorder   . Peripheral vascular disease   . PAD (peripheral artery disease)   . Diabetic peripheral neuropathy   . Anemia   . Gout   . Pneumonia 09/2012  . GERD (gastroesophageal reflux disease)   . Migraines     "q 3 months" (07/24/2013)  . Chronic kidney disease, stage IV (severe)     "never went on dialysis" (07/24/2013)  . Peripheral neuropathy   . Sleep apnea     "getting ready to get tested again cause dr says I have it" (07/24/2013) pt states was tested and was told she did not have sleep apnea / no cpap  . Dyspnea on exertion     pt denies  . History of chicken pox   . History of measles   . History of mumps   . Dialysis patient     Monday Wednesday Friday   . Arthritis     "hands" (07/24/2013) pt denies    Past Surgical History  Procedure Laterality Date  . Av fistula placement, radiocephalic Right 1610    "upper arm" (07/24/2013)  . Cesarean section  1986; 1987  . Endometrial cryoablation  1980's  . Colonoscopy    . Av fistula placement Right 2000    "lower arm" (07/24/2013)  . Transplantation renal  2013    "right" (09/04/2012)  . Cholecystectomy  2011  . Varicose vein surgery  1997    "RLE; stripped" (09/04/2012)  . Video bronchoscopy  09/07/2012    Procedure: VIDEO BRONCHOSCOPY  WITHOUT FLUORO;  Surgeon: Chesley Mires, MD;  Location: [facility] ENDOSCOPY;  Service: Cardiopulmonary;  Laterality: Bilateral;  . Tubal ligation  1987  . Cataract extraction w/ intraocular lens  implant, bilateral Bilateral 2011  . Intrauterine device (iud) insertion  2011      Family History  Problem Relation Age of Onset  . Colon cancer Father     deceased at age 26      reports that she has never smoked. She has never used smokeless tobacco. She reports that she does not drink alcohol or use illicit drugs.   Allergies  Allergen Reactions  . Hydrocodone-Acetaminophen Other (See Comments)     headaches  . Propoxyphene N-Acetaminophen Other (See Comments)     headache    Prior to Admission medications   Medication Sig Start Date End Date Taking? Authorizing Provider  allopurinol (ZYLOPRIM) 100 MG tablet Take 100 mg by mouth every morning.    Yes Historical Provider, MD  ergocalciferol (VITAMIN D2) 50000 UNITS capsule Take 50,000 Units by mouth every 30 (thirty) days. On the 1st of each month   Yes Historical Provider, MD  gabapentin (NEURONTIN) 300 MG capsule Take 300 mg by mouth 3 (three) times daily. 04/23/15  Yes Historical Provider, MD  hydrOXYzine (ATARAX/VISTARIL) 25 MG tablet Take 1 tablet by mouth 3 (three) times daily as needed. Itching. 04/08/15  Yes Historical Provider, MD  insulin aspart (NOVOLOG FLEXPEN) 100 UNIT/ML FlexPen Inject 45 Units into the skin 3 (three) times daily with meals.    Yes Historical Provider, MD  levothyroxine (SYNTHROID) 50 MCG tablet Take 50 mcg by mouth every morning.    Yes Historical Provider, MD  pregabalin (LYRICA) 75 MG capsule Take 225 mg by mouth at bedtime.  11/03/12  Yes Historical Provider, MD  sennosides-docusate sodium (SENOKOT-S) 8.6-50 MG tablet Take 2 tablets by mouth every morning.    Yes Historical Provider, MD  sevelamer carbonate (RENVELA) 800 MG tablet Take 1,600 mg by mouth 3 (three) times daily with meals.   Yes Historical Provider, MD   aspirin EC 81 MG tablet Take 81 mg by mouth every morning.     Historical Provider, MD  colchicine 0.6 MG tablet Take 1 tablet by mouth daily as needed. 05/09/15   Historical Provider, MD  oxyCODONE-acetaminophen (PERCOCET/ROXICET) 5-325 MG per tablet Take 1 tablet by mouth every 4 (four) hours as needed for severe pain. Patient not taking: Reported on 05/18/2015 03/31/15   Okey Regal, PA-C    TOI:ZTIWPY chloride, sodium chloride, oxyCODONE **AND** acetaminophen, acetaminophen (TYLENOL) oral liquid 160 mg/5 mL, alteplase, feeding supplement (NEPRO CARB STEADY), heparin, lidocaine (PF), lidocaine-prilocaine, morphine injection, ondansetron (ZOFRAN) IV, pentafluoroprop-tetrafluoroeth  Results for orders placed or performed during the hospital encounter of 05/26/15 (from the past 48 hour(s))  Glucose, capillary     Status: None   Collection Time: 05/26/15 10:38 AM  Result Value Ref Range   Glucose-Capillary 82 65 - 99 mg/dL   Comment 1 Notify RN   CBC WITH DIFFERENTIAL     Status: Abnormal   Collection Time: 05/26/15 11:27 AM  Result Value Ref Range   WBC 8.4 4.0 - 10.5 K/uL   RBC 3.52 (L) 3.87 - 5.11 MIL/uL   Hemoglobin 11.2 (L) 12.0 - 15.0 g/dL   HCT 34.7 (L) 36.0 - 46.0 %   MCV 98.6 78.0 - 100.0 fL   MCH 31.8 26.0 - 34.0 pg   MCHC 32.3 30.0 - 36.0 g/dL   RDW 14.6 11.5 - 15.5 %   Platelets 227 150 - 400 K/uL   Neutrophils Relative % 68 43 - 77 %   Neutro Abs 5.8 1.7 - 7.7 K/uL   Lymphocytes Relative 22 12 - 46 %   Lymphs Abs 1.8 0.7 - 4.0 K/uL   Monocytes Relative 6 3 - 12 %   Monocytes Absolute 0.5 0.1 - 1.0 K/uL   Eosinophils Relative 3 0 - 5 %   Eosinophils Absolute 0.3 0.0 - 0.7 K/uL   Basophils Relative 1 0 - 1 %   Basophils Absolute 0.1 0.0 - 0.1 K/uL  Comprehensive metabolic panel     Status: Abnormal   Collection Time: 05/26/15 11:27 AM  Result Value Ref Range   Sodium 140 135 - 145 mmol/L   Potassium 4.7 3.5 - 5.1 mmol/L   Chloride 99 (L) 101 - 111 mmol/L   CO2 31  22 - 32 mmol/L   Glucose, Bld 46 (L) 65 - 99 mg/dL   BUN 43 (H) 6 - 20 mg/dL   Creatinine, Ser 9.61 (H) 0.44 - 1.00 mg/dL   Calcium 9.1 8.9 - 10.3 mg/dL   Total Protein 7.5 6.5 - 8.1 g/dL   Albumin 3.6 3.5 - 5.0 g/dL   AST 15 15 - 41  U/L   ALT 12 (L) 14 - 54 U/L   Alkaline Phosphatase 80 38 - 126 U/L   Total Bilirubin 0.1 (L) 0.3 - 1.2 mg/dL   GFR calc non Af Amer 4 (L) >60 mL/min   GFR calc Af Amer 5 (L) >60 mL/min    Comment: (NOTE) The eGFR has been calculated using the CKD EPI equation. This calculation has not been validated in all clinical situations. eGFR's persistently <60 mL/min signify possible Chronic Kidney Disease.    Anion gap 10 5 - 15  hCG, serum, qualitative     Status: None   Collection Time: 05/26/15 11:27 AM  Result Value Ref Range   Preg, Serum NEGATIVE NEGATIVE    Comment:        THE SENSITIVITY OF THIS METHODOLOGY IS >10 mIU/mL.   Glucose, capillary     Status: Abnormal   Collection Time: 05/26/15 12:48 PM  Result Value Ref Range   Glucose-Capillary 31 (LL) 65 - 99 mg/dL   Comment 1 Document in Chart    Comment 2 Repeat Test    Comment 3 Call MD NNP PA CNM   Glucose, capillary     Status: Abnormal   Collection Time: 05/26/15 12:50 PM  Result Value Ref Range   Glucose-Capillary 32 (LL) 65 - 99 mg/dL   Comment 1 Document in Chart    Comment 2 Call MD NNP PA CNM   Glucose, capillary     Status: Abnormal   Collection Time: 05/26/15  1:22 PM  Result Value Ref Range   Glucose-Capillary 110 (H) 65 - 99 mg/dL  Glucose, capillary     Status: Abnormal   Collection Time: 05/26/15  3:10 PM  Result Value Ref Range   Glucose-Capillary 137 (H) 65 - 99 mg/dL  Glucose, capillary     Status: Abnormal   Collection Time: 05/26/15  6:01 PM  Result Value Ref Range   Glucose-Capillary 147 (H) 65 - 99 mg/dL  Hemoglobin and hematocrit, blood     Status: Abnormal   Collection Time: 05/26/15  7:05 PM  Result Value Ref Range   Hemoglobin 10.3 (L) 12.0 - 15.0 g/dL    HCT 31.5 (L) 36.0 - 00.5 %  Basic metabolic panel     Status: Abnormal   Collection Time: 05/26/15  7:05 PM  Result Value Ref Range   Sodium 139 135 - 145 mmol/L   Potassium 6.0 (H) 3.5 - 5.1 mmol/L    Comment: REPEATED TO VERIFY DELTA CHECK NOTED NO VISIBLE HEMOLYSIS    Chloride 102 101 - 111 mmol/L   CO2 24 22 - 32 mmol/L   Glucose, Bld 162 (H) 65 - 99 mg/dL   BUN 44 (H) 6 - 20 mg/dL   Creatinine, Ser 10.08 (H) 0.44 - 1.00 mg/dL   Calcium 8.4 (L) 8.9 - 10.3 mg/dL   GFR calc non Af Amer 4 (L) >60 mL/min   GFR calc Af Amer 4 (L) >60 mL/min    Comment: (NOTE) The eGFR has been calculated using the CKD EPI equation. This calculation has not been validated in all clinical situations. eGFR's persistently <60 mL/min signify possible Chronic Kidney Disease.    Anion gap 13 5 - 15  Glucose, capillary     Status: Abnormal   Collection Time: 05/26/15  7:59 PM  Result Value Ref Range   Glucose-Capillary 150 (H) 65 - 99 mg/dL  Glucose, capillary     Status: Abnormal   Collection Time: 05/27/15 12:10 AM  Result Value Ref Range   Glucose-Capillary 128 (H) 65 - 99 mg/dL  Glucose, capillary     Status: None   Collection Time: 05/27/15  4:27 AM  Result Value Ref Range   Glucose-Capillary 84 65 - 99 mg/dL  CBC WITH DIFFERENTIAL     Status: Abnormal   Collection Time: 05/27/15  5:40 AM  Result Value Ref Range   WBC 9.4 4.0 - 10.5 K/uL   RBC 3.05 (L) 3.87 - 5.11 MIL/uL   Hemoglobin 9.8 (L) 12.0 - 15.0 g/dL   HCT 29.7 (L) 36.0 - 46.0 %   MCV 97.4 78.0 - 100.0 fL   MCH 32.1 26.0 - 34.0 pg   MCHC 33.0 30.0 - 36.0 g/dL   RDW 14.6 11.5 - 15.5 %   Platelets 199 150 - 400 K/uL   Neutrophils Relative % 75 43 - 77 %   Neutro Abs 7.1 1.7 - 7.7 K/uL   Lymphocytes Relative 17 12 - 46 %   Lymphs Abs 1.6 0.7 - 4.0 K/uL   Monocytes Relative 6 3 - 12 %   Monocytes Absolute 0.6 0.1 - 1.0 K/uL   Eosinophils Relative 2 0 - 5 %   Eosinophils Absolute 0.2 0.0 - 0.7 K/uL   Basophils Relative 0 0 - 1  %   Basophils Absolute 0.0 0.0 - 0.1 K/uL  Comprehensive metabolic panel     Status: Abnormal   Collection Time: 05/27/15  5:40 AM  Result Value Ref Range   Sodium 138 135 - 145 mmol/L   Potassium 6.3 (HH) 3.5 - 5.1 mmol/L    Comment: NO VISIBLE HEMOLYSIS CRITICAL RESULT CALLED TO, READ BACK BY AND VERIFIED WITH: HOGAN,M/5W '@0634'  ON 05/27/15 BY KARCZEWSKI,S.    Chloride 100 (L) 101 - 111 mmol/L   CO2 25 22 - 32 mmol/L   Glucose, Bld 85 65 - 99 mg/dL   BUN 47 (H) 6 - 20 mg/dL   Creatinine, Ser 10.99 (H) 0.44 - 1.00 mg/dL   Calcium 8.3 (L) 8.9 - 10.3 mg/dL   Total Protein 6.5 6.5 - 8.1 g/dL   Albumin 3.0 (L) 3.5 - 5.0 g/dL   AST 148 (H) 15 - 41 U/L   ALT 90 (H) 14 - 54 U/L   Alkaline Phosphatase 119 38 - 126 U/L   Total Bilirubin 0.8 0.3 - 1.2 mg/dL   GFR calc non Af Amer 3 (L) >60 mL/min   GFR calc Af Amer 4 (L) >60 mL/min    Comment: (NOTE) The eGFR has been calculated using the CKD EPI equation. This calculation has not been validated in all clinical situations. eGFR's persistently <60 mL/min signify possible Chronic Kidney Disease.    Anion gap 13 5 - 15  Glucose, capillary     Status: Abnormal   Collection Time: 05/27/15  7:52 AM  Result Value Ref Range   Glucose-Capillary 100 (H) 65 - 99 mg/dL     ROS: No reported positives  On ROS.    Physical Exam: Filed Vitals:   05/27/15 0543  BP: 139/55  Pulse: 89  Temp: 98.2 F (36.8 C)  Resp: 18     General: Alert pleasant obese BF NAD on hd  HEENT: [place] , MMM, nonicteric Neck: supple , no  jvd Heart:  RRR, no mur, rub or gallop Lungs: CTA bilat  Abdomen: Obese, bs pos, soft , Non distended, min tenderness  Extremities: trace bipedal edema Skin:  No overt rash or pedal ulcers Neuro: OX3 ,no acute  focal deficits Dialysis Access: R FA AVF pos bruit  Dialysis Orders: Center: [facility]  on MWF . EDW 114.5 kg HD Bath  2k, 2ca  Time 4hrs Heparin 5000. Access Rfa avf     Zemplar calcitriol 2.98mg po /HD   Units IV/HD   Venofer  [id number]    Assessment/Plan 1. ESRD -  K 6.3 HD today on schedule , [facility] sec mechanical difficulties's at center 2. Hyperkalemia 3. Obesity sp laparoscopic sleeve gastrectomy- per Dr. HoExcell Seltzer4. Hypertension/volume  -  uf 2 to 2.5 l on hd as tolerated no bp meds  Vol stable  now 5. Anemia  - Fe load on hd last dose 06/01/15  No esa listed but hgb 9.8 post op / start low dose as op if am hgb still low  6. Metabolic bone disease -  Binder and po vit d on hd 7. DM type 2  DaErnest HaberPA-C [id number]/24/2016, 9:37 AM   Pt seen, examined and agree w A/P as above.  RoKelly SplinterD pager [phone]  cell [phone]/24/2016, 1:56 PM

## 2015-05-27 NOTE — Progress Notes (Signed)
Patient alert and oriented, Post op day 1.  Provided support and encouragement.  Encouraged pulmonary toilet, ambulation and small sips of liquids.  All questions answered.  Will continue to monitor. 

## 2015-05-27 NOTE — Progress Notes (Signed)
Patient ID: Wanda [name], female   DOB: 1960-11-19, 54 y.o.   MRN: [id number] 1 Day Post-Op  Subjective: No complaints this morning. Denies pain or nausea.  Objective: Vital signs in last 24 hours: Temp:  [97.2 F (36.2 C)-98.5 F (36.9 C)] 98.2 F (36.8 C) (08/24 0543) Pulse Rate:  [79-103] 89 (08/24 0543) Resp:  [14-18] 18 (08/24 0543) BP: (139-190)/(53-87) 139/55 mmHg (08/24 0543) SpO2:  [98 %-100 %] 99 % (08/24 0543) Weight:  [119.523 kg (263 lb 8 oz)] 119.523 kg (263 lb 8 oz) (08/23 1042)    Intake/Output from previous day: 08/23 0701 - 08/24 0700 In: 630 [I.V.:630] Out: -  Intake/Output this shift:    General appearance: alert, cooperative and no distress GI: normal findings: soft, non-tender Incision/Wound: clean and dry  Lab Results:   Recent Labs  05/26/15 1127 05/26/15 1905 05/27/15 0540  WBC 8.4  --  9.4  HGB 11.2* 10.3* 9.8*  HCT 34.7* 31.5* 29.7*  PLT 227  --  199   BMET  Recent Labs  05/26/15 1905 05/27/15 0540  NA 139 138  K 6.0* 6.3*  CL 102 100*  CO2 24 25  GLUCOSE 162* 85  BUN 44* 47*  CREATININE 10.08* 10.99*  CALCIUM 8.4* 8.3*   CBG (last 3)   Recent Labs  05/27/15 0010 05/27/15 0427 05/27/15 0752  GLUCAP 128* 84 100*      Studies/Results: Dg Ugi  W/kub  05/25/2015   CLINICAL DATA:  Morbid obesity. Pre-op evaluation for bariatric surgery.  EXAM: UPPER GI SERIES WITH KUB  TECHNIQUE: After obtaining a scout radiograph a routine upper GI series was performed using thin barium.  FLUOROSCOPY TIME:  Fluoroscopy Time (in minutes and seconds): 1 minutes 42 seconds  Number of Acquired Images:  13  COMPARISON:  None.  FINDINGS: Scout radiograph: Unremarkable bowel gas pattern. Multiple surgical clips seen in right upper quadrant and right pelvis. IUD also seen within the central pelvis.  Esophagus: No evidence of esophageal mass or stricture. Motility is within normal limits. No gastroesophageal reflux observed.  Stomach: No hiatal  hernia visualized. No evidence of gastric mass or ulcer.  Duodenum: No ulcer or other significant abnormality seen involving duodenal bulb or sweep.  Other:  None.  IMPRESSION: Negative upper GI series.   Electronically Signed   By: Earle Gell M.D.   On: 05/25/2015 09:25    Anti-infectives: Anti-infectives    Start     Dose/Rate Route Frequency Ordered Stop   05/26/15 1100  cefOXitin (MEFOXIN) 2 g in dextrose 5 % 50 mL IVPB     2 g 100 mL/hr over 30 Minutes Intravenous On call to O.R. 05/26/15 1040 05/26/15 1350      Assessment/Plan: s/p Procedure(s): LAPAROSCOPIC GASTRIC SLEEVE RESECTION Doing well without apparent complication. Plan transfer to [facility] today and contact renal service for dialysis needs Start postop day 1 diet   LOS: 1 day    Pj Zehner T 05/27/2015

## 2015-05-27 NOTE — Procedures (Signed)
.  rsproc

## 2015-05-27 NOTE — Progress Notes (Signed)
Notified Dr Alvino Blood of abnormal lab K+ 6.3.  Dr Alvino Blood will follow-up on patient needing dialysis today.

## 2015-05-28 LAB — CBC WITH DIFFERENTIAL/PLATELET
Basophils Absolute: 0 10*3/uL (ref 0.0–0.1)
Basophils Relative: 0 % (ref 0–1)
Eosinophils Absolute: 0.4 10*3/uL (ref 0.0–0.7)
Eosinophils Relative: 6 % — ABNORMAL HIGH (ref 0–5)
HCT: 31.7 % — ABNORMAL LOW (ref 36.0–46.0)
Hemoglobin: 10.2 g/dL — ABNORMAL LOW (ref 12.0–15.0)
Lymphocytes Relative: 18 % (ref 12–46)
Lymphs Abs: 1.3 10*3/uL (ref 0.7–4.0)
MCH: 31.8 pg (ref 26.0–34.0)
MCHC: 32.2 g/dL (ref 30.0–36.0)
MCV: 98.8 fL (ref 78.0–100.0)
Monocytes Absolute: 0.5 10*3/uL (ref 0.1–1.0)
Monocytes Relative: 8 % (ref 3–12)
Neutro Abs: 4.9 10*3/uL (ref 1.7–7.7)
Neutrophils Relative %: 68 % (ref 43–77)
Platelets: 181 10*3/uL (ref 150–400)
RBC: 3.21 MIL/uL — ABNORMAL LOW (ref 3.87–5.11)
RDW: 14.8 % (ref 11.5–15.5)
WBC: 7.1 10*3/uL (ref 4.0–10.5)

## 2015-05-28 LAB — GLUCOSE, CAPILLARY
Glucose-Capillary: 117 mg/dL — ABNORMAL HIGH (ref 65–99)
Glucose-Capillary: 121 mg/dL — ABNORMAL HIGH (ref 65–99)
Glucose-Capillary: 75 mg/dL (ref 65–99)
Glucose-Capillary: 81 mg/dL (ref 65–99)

## 2015-05-28 MED ORDER — SUCROFERRIC OXYHYDROXIDE 500 MG PO CHEW
1000.0000 mg | CHEWABLE_TABLET | Freq: Three times a day (TID) | ORAL | Status: DC
  Filled 2015-05-28 (×3): qty 2

## 2015-05-28 MED ORDER — INSULIN ASPART 100 UNIT/ML FLEXPEN: 10.0000 [IU] | PEN_INJECTOR | Freq: Three times a day (TID) | SUBCUTANEOUS | Status: DC

## 2015-05-28 NOTE — Discharge Instructions (Signed)

## 2015-05-28 NOTE — Progress Notes (Addendum)
Patient alert and oriented, pain is controlled. Patient is tolerating fluids, advanced to protein shake today, patient has already tolerated 6oz, patient understands protein and fluid restriction and will work with RD at dialysis center with diet.  Patient and I discussed medications and need to follow-up with providers per some medications will be too large to swallow and cannot be crushed.  Reviewed Gastric sleeve discharge instructions with patient and patient is able to articulate understanding.  Provided information on BELT program, Support Group and [facility] outpatient pharmacy. All questions answered, will continue to monitor.

## 2015-05-28 NOTE — Clinical Documentation Improvement (Signed)
General Surgery  Please specify diagnosis related to below supporting information, if appropriate.    Acute Blood Loss Anemia, including the suspected or known cause or associated condition(s)  Acute on chronic blood loss anemia, including the suspected or known cause or associated condition(s)  Chronic blood loss anemia, including the suspected or known cause or associated condition(s)  Precipitous drop in Hematocrit, including the suspected or known cause or associated condition(s)  Other  Clinically Undetermined  Document any associated diagnoses/conditions.   Supporting Information:  Per 05/27/15 Consult note = Past hx of anemia.   Anemia - Fe load on hd last dose 06/01/15 No esa listed but hgb 9.8 post op / start low dose as op if am hgb still low.  Patient's labs during this admission  Component     Latest Ref Rng 05/26/2015        11:27 AM  Hemoglobin     12.0 - 15.0 g/dL 11.2 (L)  HCT     36.0 - 46.0 % 34.7 (L)   Component     Latest Ref Rng 05/26/2015         7:05 PM  Hemoglobin     12.0 - 15.0 g/dL 10.3 (L)  HCT     36.0 - 46.0 % 31.5 (L)   Component     Latest Ref Rng 05/27/2015         5:40 AM  Hemoglobin     12.0 - 15.0 g/dL 9.8 (L)  HCT     36.0 - 46.0 % 29.7 (L)   Component     Latest Ref Rng 05/28/2015         3:46 AM  Hemoglobin     12.0 - 15.0 g/dL 10.2 (L)  HCT     36.0 - 46.0 % 31.7 (L)    Please exercise your independent, professional judgment when responding. A specific answer is not anticipated or expected.   Thank You,  Sonda Rumble RN, BSN, CCM Clinical Documentation Specialist Phone: [place]

## 2015-05-28 NOTE — Progress Notes (Signed)
Subjective:  No cos/ tolerated hd yest on schedule   Objective Vital signs in last 24 hours: Filed Vitals:   05/27/15 1553 05/27/15 1740 05/27/15 2017 05/28/15 0607  BP: 154/69 129/71 137/61 127/59  Pulse: 91 106 99 86  Temp: 98 F (36.7 C) 98.2 F (36.8 C) 98.9 F (37.2 C) 98.2 F (36.8 C)  TempSrc: Oral Oral Oral Oral  Resp: 16 18 18 20   Height:      Weight: 117.2 kg (258 lb 6.1 oz)     SpO2:  93% 100% 95%   Weight change: 0.877 kg (1 lb 14.9 oz)  Physical Exam: General: Alert pleasant  NAD  Heart: RRR, no mur, rub or gallop Lungs: CTA bilat  Abdomen: Obese, bs pos, soft , Non distended, min tenderness  Extremities: trace bipedal edema Dialysis Access: R FA AVF pos bruit  OP Dialysis Orders: Center: [facility] on MWF . EDW 114.5 kg HD Bath 2k, 2ca Time 4hrs Heparin 5000. Access Rfa avf  Zemplar calcitriol 2.22mcg po /HD Units IV/HD Venofer 100mg  x5     Problem/Plan: 1. ESRD - HD yesterday on schedule MWF  Tolerated  2. Hyperkalemia- improved yesterday had hd , fu labs 3. Obesity sp laparoscopic sleeve gastrectomy- per Dr. Excell Seltzer  4. Hypertension/volume - bp stable yesterday uf 3 l on hd / sl over edw  Still /but did not challenge uf post surg Yest. / if here in am  uf to edw /ANTICPATE lower EDW sp surgery. no bp meds  5. Anemia - Fe load on hd last dose 06/01/15 No esa listed but hgb 9.8 post op / start low dose as op / hgb 10.2 this am  6. Metabolic bone disease - was on Renvela phos   Binder and po vit d on hd/ CHANGE RENVELA  To Velph[place] .  dw Pt and  Bariatric coordinator =" try to avoid  Pill form binder  with this  surgery" and Possible constipation effect of Renvela. 7. DM type 2 8. Dispo - for discharge today    Ernest Haber, PA-C [place] [phone] 05/28/2015,9:27 AM  LOS: 2 days   Pt seen, examined and agree w A/P as above.  Kelly Splinter MD pager [phone]    cell [phone] 05/28/2015, 2:01  PM    Labs: Basic Metabolic Panel:  Recent Labs Lab 05/26/15 1905 05/27/15 0540 05/27/15 1257  NA 139 138 138  K 6.0* 6.3* 5.7*  CL 102 100* 99*  CO2 24 25 26   GLUCOSE 162* 85 116*  BUN 44* 47* 46*  CREATININE 10.08* 10.99* 11.68*  CALCIUM 8.4* 8.3* 8.5*   Liver Function Tests:  Recent Labs Lab 05/26/15 1127 05/27/15 0540  AST 15 148*  ALT 12* 90*  ALKPHOS 80 119  BILITOT 0.1* 0.8  PROT 7.5 6.5  ALBUMIN 3.6 3.0*   No results for input(s): LIPASE, AMYLASE in the last 168 hours. No results for input(s): AMMONIA in the last 168 hours. CBC:  Recent Labs Lab 05/26/15 1127 05/26/15 1905 05/27/15 0540 05/28/15 0346  WBC 8.4  --  9.4 7.1  NEUTROABS 5.8  --  7.1 4.9  HGB 11.2* 10.3* 9.8* 10.2*  HCT 34.7* 31.5* 29.7* 31.7*  MCV 98.6  --  97.4 98.8  PLT 227  --  199 181   Cardiac Enzymes: No results for input(s): CKTOTAL, CKMB, CKMBINDEX, TROPONINI in the last 168 hours. CBG:  Recent Labs Lab 05/27/15 0752 05/27/15 2011 05/28/15 05/28/15 0411 05/28/15 0756  GLUCAP 100*  120* 117* 81 75    Studies/Results: No results found. Medications: . sodium chloride 50 mL/hr at 05/27/15 1825   . antiseptic oral rinse  7 mL Mouth Rinse BID  . enoxaparin (LOVENOX) injection  30 mg Subcutaneous Q12H  . insulin aspart  0-15 Units Subcutaneous 6 times per day  . insulin glargine  15 Units Subcutaneous QHS  . levothyroxine  25 mcg Intravenous Daily  . pantoprazole (PROTONIX) IV  40 mg Intravenous QHS  . protein supplement  2 oz Oral QID   Or  . protein supplement  2 oz Oral QID   Or  . protein supplement  2 oz Oral QID

## 2015-05-28 NOTE — Progress Notes (Signed)
Patient ID: Wanda [name], female   DOB: [birth year], 54 y.o.   MRN: [id number] 2 Days Post-Op  Subjective: No C/O.  Tol shakes without pain or nausea  Objective: Vital signs in last 24 hours: Temp:  [98 F (36.7 C)-98.9 F (37.2 C)] 98.2 F (36.8 C) (08/25 0607) Pulse Rate:  [86-106] 86 (08/25 0607) Resp:  [16-20] 20 (08/25 0607) BP: (108-154)/(59-75) 127/59 mmHg (08/25 0607) SpO2:  [93 %-100 %] 95 % (08/25 0607) Weight:  [117.2 kg (258 lb 6.1 oz)] 117.2 kg (258 lb 6.1 oz) (08/24 1553)    Intake/Output from previous day: 08/24 0701 - 08/25 0700 In: 916 [P.O.:320; I.V.:596] Out: 3000  Intake/Output this shift:    General appearance: alert, cooperative and no distress GI: normal findings: soft, non-tender Incision/Wound: Clean and dry  Lab Results:   Recent Labs  05/27/15 0540 05/28/15 0346  WBC 9.4 7.1  HGB 9.8* 10.2*  HCT 29.7* 31.7*  PLT 199 181   BMET  Recent Labs  05/27/15 0540 05/27/15 1257  NA 138 138  K 6.3* 5.7*  CL 100* 99*  CO2 25 26  GLUCOSE 85 116*  BUN 47* 46*  CREATININE 10.99* 11.68*  CALCIUM 8.3* 8.5*   CBG (last 3)   Recent Labs  05/28/15 0411 05/28/15 0756 05/28/15 1206  GLUCAP 81 75 121*      Studies/Results: No results found.  Anti-infectives: Anti-infectives    Start     Dose/Rate Route Frequency Ordered Stop   05/26/15 1100  cefOXitin (MEFOXIN) 2 g in dextrose 5 % 50 mL IVPB     2 g 100 mL/hr over 30 Minutes Intravenous On call to O.R. 05/26/15 1040 05/26/15 1350      Assessment/Plan: s/p Procedure(s): LAPAROSCOPIC GASTRIC SLEEVE RESECTION Doing very well.  OK for discharge    LOS: 2 days    Sharion Grieves T 05/28/2015

## 2015-05-28 NOTE — Plan of Care (Signed)
Problem: Food- and Nutrition-Related Knowledge Deficit (NB-1.1) Goal: Nutrition education Formal process to instruct or train a patient/client in a skill or to impart knowledge to help patients/clients voluntarily manage or modify food choices and eating behavior to maintain or improve health. Outcome: Completed/Met Date Met:  05/28/15 Nutrition Education Note  RD consulted for nutrition education regarding bariatric diet.  Pt POD #1 sleeve gastrectomy. Spoke with RN, who confirms that pt has been seen by bariatric RN coordinator earlier today.   Spoke with pt at bedside; she is very knolwegeable about bariatric diet principles due to extesive education PTA from bariatric coordinator and education provided by [facility]. She was reviewing post-op diet handouts at time of visit. Pt was able to verbalize which vitamins (ProRenal crushable tablet and Dialyvite 800+D) and protein supplements (Unjury, GenePro, Syntrax, and Pure Protein) were appropriate for her. She has been consuming Unjury chicken soup supplement during hospitalization and likes the flavor; expresses intention to purchase and consume after discharge. Encouraged pt to prioritize consumption of protein shakes, particularly if she is on a fluid restriction. Teach back method used.  Pt had no further questions at this time; encouraged continued follow-up with bariatric coordinator and [facility] after discharge for further follow-up and questions.   Expect very good compliance.  Body mass index is 37.07 kg/(m^2). Pt meets criteria for obesity, class II based on current BMI.  Current diet order is NPO, patient is consuming approximately n/a% of meals at this time. Labs and medications reviewed. No further nutrition interventions warranted at this time. RD contact information provided. If additional nutrition issues arise, please re-consult RD.   Jorah Hua A. Jimmye Norman, RD, LDN, CDE Pager: [phone] After hours Pager: [phone]

## 2015-06-01 ENCOUNTER — Telehealth (HOSPITAL_COMMUNITY): Payer: Self-pay

## 2015-06-01 LAB — GLUCOSE, CAPILLARY: Glucose-Capillary: 31 mg/dL — CL (ref 65–99)

## 2015-06-01 NOTE — Discharge Summary (Signed)
Patient ID: Wanda [name] [id number] 53 y.o. [birth year]  05/26/2015  Discharge date and time: 05/28/2015   Admitting Physician: Excell Seltzer T  Discharge Physician: Excell Seltzer T  Admission Diagnoses: Morbid Obesity  Discharge Diagnoses: same  Operations: Procedure(s): LAPAROSCOPIC GASTRIC SLEEVE RESECTION  Admission Condition: fair  Discharged Condition: fair  Indication for Admission: patient is a 54 year old female with morbid obesity unresponsive to medical management and multiple comorbidities including insulin-dependent diabetes mellitus as well as end-stage renal disease. She is on the transplant list and is felt to be a much better transplant candidate at a lower weight. After extensive preoperative workup and discussion detailed elsewhere we have elected to proceed with laparoscopic sleeve gastrectomy as surgical treatment for morbid obesity.  Hospital Course: on the morning of admission the patient underwent an uneventful laparoscopic sleeve gastrectomy. Her postoperative course was uncomplicated. On the first postoperative day she had minimal pain, stable vital signs and a benign abdominal exam. She was transferred to [facility] for dialysis and underwent routine dialysis. She was started on water which she tolerated well. The following day she was started on protein shakes and tolerated these without difficulty. Abdomen remained benign. There was a slight expected drop in hemoglobin and white count remained normal. She was felt ready for discharge on the second postoperative day.  Consults: nephrology   Disposition: Home  Patient Instructions:    Medication List    TAKE these medications        allopurinol 100 MG tablet  Commonly known as:  ZYLOPRIM  Take 100 mg by mouth every morning.     aspirin EC 81 MG tablet  Take 81 mg by mouth every morning.  Notes to Patient:  Avoid NSAIDs for 6-8 weeks after surgery      colchicine 0.6 MG  tablet  Take 1 tablet by mouth daily as needed.     ergocalciferol 50000 UNITS capsule  Commonly known as:  VITAMIN D2  Take 50,000 Units by mouth every 30 (thirty) days. On the 1st of each month     gabapentin 300 MG capsule  Commonly known as:  NEURONTIN  Take 300 mg by mouth 3 (three) times daily.  Notes to Patient:  Take by itself and wait 20 minutes before taking additional pills     hydrOXYzine 25 MG tablet  Commonly known as:  ATARAX/VISTARIL  Take 1 tablet by mouth 3 (three) times daily as needed. Itching.     insulin aspart 100 UNIT/ML FlexPen  Commonly known as:  NOVOLOG FLEXPEN  Inject 10 Units into the skin 3 (three) times daily with meals.     oxyCODONE-acetaminophen 5-325 MG per tablet  Commonly known as:  PERCOCET/ROXICET  Take 1 tablet by mouth every 4 (four) hours as needed for severe pain.     pregabalin 75 MG capsule  Commonly known as:  LYRICA  Take 225 mg by mouth at bedtime.     sennosides-docusate sodium 8.6-50 MG tablet  Commonly known as:  SENOKOT-S  Take 2 tablets by mouth every morning.     sevelamer carbonate 800 MG tablet  Commonly known as:  RENVELA  Take 1,600 mg by mouth 3 (three) times daily with meals.  Notes to Patient:  Do not crush or chew, medication is coated, call Prescribing MD and discuss options for this medication     SYNTHROID 50 MCG tablet  Generic drug:  levothyroxine  Take 50 mcg by mouth every morning.        Activity: activity  as tolerated Diet: bariatric protein shakes Wound Care: none needed  Follow-up:  With Dr. Excell Seltzer in 3 weeks.  Signed: Edward Jolly MD, FACS  06/01/2015, 9:25 AM

## 2015-06-01 NOTE — Telephone Encounter (Signed)
Made discharge phone call to patient per DROP protocol. Asking the following questions.    1. Do you have someone to care for you now that you are home?  yes 2. Are you having pain now that is not relieved by your pain medication?  no 3. Are you able to drink the recommended daily amount of fluids (48 ounces minimum/day) and protein (60-80 grams/day) as prescribed by the dietitian or nutritional counselor?  It is going pretty good, I am trying to get the protein in and I am doing pretty well 4. Are you taking the vitamins and minerals as prescribed?  yes 5. Do you have the "on call" number to contact your surgeon if you have a problem or question?  yes 6. Are your incisions free of redness, swelling or drainage? (If steri strips, address that these can fall off, shower as tolerated) yes 7. Have your bowels moved since your surgery?  If not, are you passing gas?  yes 8. Are you up and walking 3-4 times per day?  yes

## 2015-06-03 ENCOUNTER — Emergency Department (HOSPITAL_COMMUNITY)
Admission: EM | Admit: 2015-06-03 | Discharge: 2015-06-03 | Disposition: A | Discharge status: Home / Self Care | Payer: Medicare HMO | Attending: Emergency Medicine | Admitting: Emergency Medicine

## 2015-06-03 ENCOUNTER — Emergency Department (HOSPITAL_COMMUNITY): Payer: Medicare HMO

## 2015-06-03 ENCOUNTER — Encounter (HOSPITAL_COMMUNITY): Payer: Self-pay | Admitting: Emergency Medicine

## 2015-06-03 DIAGNOSIS — Z794 Long term (current) use of insulin: Secondary | ICD-10-CM | POA: Insufficient documentation

## 2015-06-03 DIAGNOSIS — E039 Hypothyroidism, unspecified: Secondary | ICD-10-CM | POA: Insufficient documentation

## 2015-06-03 DIAGNOSIS — I129 Hypertensive chronic kidney disease with stage 1 through stage 4 chronic kidney disease, or unspecified chronic kidney disease: Secondary | ICD-10-CM | POA: Diagnosis not present

## 2015-06-03 DIAGNOSIS — K219 Gastro-esophageal reflux disease without esophagitis: Secondary | ICD-10-CM | POA: Diagnosis not present

## 2015-06-03 DIAGNOSIS — Z79899 Other long term (current) drug therapy: Secondary | ICD-10-CM | POA: Diagnosis not present

## 2015-06-03 DIAGNOSIS — E669 Obesity, unspecified: Secondary | ICD-10-CM | POA: Diagnosis not present

## 2015-06-03 DIAGNOSIS — R109 Unspecified abdominal pain: Secondary | ICD-10-CM | POA: Diagnosis not present

## 2015-06-03 DIAGNOSIS — Z992 Dependence on renal dialysis: Secondary | ICD-10-CM | POA: Diagnosis not present

## 2015-06-03 DIAGNOSIS — Z8619 Personal history of other infectious and parasitic diseases: Secondary | ICD-10-CM | POA: Diagnosis not present

## 2015-06-03 DIAGNOSIS — Z8701 Personal history of pneumonia (recurrent): Secondary | ICD-10-CM | POA: Diagnosis not present

## 2015-06-03 DIAGNOSIS — G43909 Migraine, unspecified, not intractable, without status migrainosus: Secondary | ICD-10-CM | POA: Insufficient documentation

## 2015-06-03 DIAGNOSIS — Z940 Kidney transplant status: Secondary | ICD-10-CM | POA: Insufficient documentation

## 2015-06-03 DIAGNOSIS — E119 Type 2 diabetes mellitus without complications: Secondary | ICD-10-CM | POA: Diagnosis not present

## 2015-06-03 DIAGNOSIS — N184 Chronic kidney disease, stage 4 (severe): Secondary | ICD-10-CM | POA: Diagnosis not present

## 2015-06-03 DIAGNOSIS — Z862 Personal history of diseases of the blood and blood-forming organs and certain disorders involving the immune mechanism: Secondary | ICD-10-CM | POA: Diagnosis not present

## 2015-06-03 DIAGNOSIS — Z9889 Other specified postprocedural states: Secondary | ICD-10-CM | POA: Insufficient documentation

## 2015-06-03 DIAGNOSIS — M109 Gout, unspecified: Secondary | ICD-10-CM | POA: Insufficient documentation

## 2015-06-03 DIAGNOSIS — R112 Nausea with vomiting, unspecified: Secondary | ICD-10-CM

## 2015-06-03 LAB — CBC
HCT: 34.2 % — ABNORMAL LOW (ref 36.0–46.0)
Hemoglobin: 11.3 g/dL — ABNORMAL LOW (ref 12.0–15.0)
MCH: 32.3 pg (ref 26.0–34.0)
MCHC: 33 g/dL (ref 30.0–36.0)
MCV: 97.7 fL (ref 78.0–100.0)
Platelets: 230 10*3/uL (ref 150–400)
RBC: 3.5 MIL/uL — ABNORMAL LOW (ref 3.87–5.11)
RDW: 14.3 % (ref 11.5–15.5)
WBC: 11.6 10*3/uL — ABNORMAL HIGH (ref 4.0–10.5)

## 2015-06-03 LAB — COMPREHENSIVE METABOLIC PANEL
ALT: 21 U/L (ref 14–54)
AST: 27 U/L (ref 15–41)
Albumin: 3.8 g/dL (ref 3.5–5.0)
Alkaline Phosphatase: 108 U/L (ref 38–126)
Anion gap: 13 (ref 5–15)
BUN: 15 mg/dL (ref 6–20)
CO2: 30 mmol/L (ref 22–32)
Calcium: 9 mg/dL (ref 8.9–10.3)
Chloride: 93 mmol/L — ABNORMAL LOW (ref 101–111)
Creatinine, Ser: 5.69 mg/dL — ABNORMAL HIGH (ref 0.44–1.00)
GFR calc Af Amer: 9 mL/min — ABNORMAL LOW (ref >60)
GFR calc non Af Amer: 8 mL/min — ABNORMAL LOW (ref >60)
Glucose, Bld: 120 mg/dL — ABNORMAL HIGH (ref 65–99)
Potassium: 4.3 mmol/L (ref 3.5–5.1)
Sodium: 136 mmol/L (ref 135–145)
Total Bilirubin: 0.8 mg/dL (ref 0.3–1.2)
Total Protein: 8.5 g/dL — ABNORMAL HIGH (ref 6.5–8.1)

## 2015-06-03 LAB — LIPASE, BLOOD: Lipase: 12 U/L — ABNORMAL LOW (ref 22–51)

## 2015-06-03 MED ORDER — FENTANYL CITRATE (PF) 100 MCG/2ML IJ SOLN
50.0000 ug | Freq: Once | INTRAMUSCULAR | Status: AC
  Administered 2015-06-03: 50 ug via INTRAVENOUS
  Filled 2015-06-03: qty 2

## 2015-06-03 MED ORDER — ONDANSETRON HCL 4 MG PO TABS: 4.0000 mg | ORAL_TABLET | Freq: Four times a day (QID) | ORAL | Status: DC

## 2015-06-03 MED ORDER — ONDANSETRON 4 MG PO TBDP: 4.0000 mg | ORAL_TABLET | Freq: Once | ORAL | Status: DC

## 2015-06-03 MED ORDER — ONDANSETRON HCL 4 MG/2ML IJ SOLN
4.0000 mg | Freq: Once | INTRAMUSCULAR | Status: AC
  Administered 2015-06-03: 4 mg via INTRAVENOUS
  Filled 2015-06-03: qty 2

## 2015-06-03 NOTE — ED Notes (Signed)
Per patient, states bariatric surgery last week-started vomiting last night-drank protein shake and went to dialysis-during dialysis she felt sick-was told to come for eval

## 2015-06-03 NOTE — ED Notes (Signed)
I attempted to collect labs and was unsuccessful. 

## 2015-06-03 NOTE — Discharge Instructions (Signed)
Nausea and Vomiting Follow up with your general surgeon.  Return for inability to hold down fluids or increased abdominal pain.  Nausea is a sick feeling that often comes before throwing up (vomiting). Vomiting is a reflex where stomach contents come out of your mouth. Vomiting can cause severe loss of body fluids (dehydration). Children and elderly adults can become dehydrated quickly, especially if they also have diarrhea. Nausea and vomiting are symptoms of a condition or disease. It is important to find the cause of your symptoms. CAUSES   Direct irritation of the stomach lining. This irritation can result from increased acid production (gastroesophageal reflux disease), infection, food poisoning, taking certain medicines (such as nonsteroidal anti-inflammatory drugs), alcohol use, or tobacco use.  Signals from the brain.These signals could be caused by a headache, heat exposure, an inner ear disturbance, increased pressure in the brain from injury, infection, a tumor, or a concussion, pain, emotional stimulus, or metabolic problems.  An obstruction in the gastrointestinal tract (bowel obstruction).  Illnesses such as diabetes, hepatitis, gallbladder problems, appendicitis, kidney problems, cancer, sepsis, atypical symptoms of a heart attack, or eating disorders.  Medical treatments such as chemotherapy and radiation.  Receiving medicine that makes you sleep (general anesthetic) during surgery. DIAGNOSIS Your caregiver may ask for tests to be done if the problems do not improve after a few days. Tests may also be done if symptoms are severe or if the reason for the nausea and vomiting is not clear. Tests may include:  Urine tests.  Blood tests.  Stool tests.  Cultures (to look for evidence of infection).  X-rays or other imaging studies. Test results can help your caregiver make decisions about treatment or the need for additional tests. TREATMENT You need to stay well hydrated.  Drink frequently but in small amounts.You may wish to drink water, sports drinks, clear broth, or eat frozen ice pops or gelatin dessert to help stay hydrated.When you eat, eating slowly may help prevent nausea.There are also some antinausea medicines that may help prevent nausea. HOME CARE INSTRUCTIONS   Take all medicine as directed by your caregiver.  If you do not have an appetite, do not force yourself to eat. However, you must continue to drink fluids.  If you have an appetite, eat a normal diet unless your caregiver tells you differently.  Eat a variety of complex carbohydrates (rice, wheat, potatoes, bread), lean meats, yogurt, fruits, and vegetables.  Avoid high-fat foods because they are more difficult to digest.  Drink enough water and fluids to keep your urine clear or pale yellow.  If you are dehydrated, ask your caregiver for specific rehydration instructions. Signs of dehydration may include:  Severe thirst.  Dry lips and mouth.  Dizziness.  Dark urine.  Decreasing urine frequency and amount.  Confusion.  Rapid breathing or pulse. SEEK IMMEDIATE MEDICAL CARE IF:   You have blood or brown flecks (like coffee grounds) in your vomit.  You have black or bloody stools.  You have a severe headache or stiff neck.  You are confused.  You have severe abdominal pain.  You have chest pain or trouble breathing.  You do not urinate at least once every 8 hours.  You develop cold or clammy skin.  You continue to vomit for longer than 24 to 48 hours.  You have a fever. MAKE SURE YOU:   Understand these instructions.  Will watch your condition.  Will get help right away if you are not doing well or get worse. Document  Released: 09/19/2005 Document Revised: 12/12/2011 Document Reviewed: 02/16/2011 [facility] Patient Information 2015 [place], [place]. This information is not intended to replace advice given to you by your health care provider. Make sure you  discuss any questions you have with your health care provider.

## 2015-06-03 NOTE — ED Provider Notes (Signed)
CSN: [id number]     Arrival date & time 06/03/15  1735 History   First MD Initiated Contact with Patient 06/03/15 2047     Chief Complaint  Patient presents with  . Emesis     (Consider location/radiation/quality/duration/timing/severity/associated sxs/prior Treatment) Patient is a 54 y.o. female presenting with vomiting. The history is provided by the patient. No language interpreter was used.  Emesis Associated symptoms: abdominal pain   Associated symptoms: no chills and no diarrhea   Ms. Wanda [name] is a 54 y.o female with a history DM, right renal transplant (on dialysis M/W/F) and lapparoscopic gastric sleeve who presents from dialysis center for persistent nausea since yesterday. She states she had one episode of vomiting last night after drinking a protein shake. She has had no food since the surgery as recommended by her doctor. She denies any vomiting today but states her abdomen is hurting due to dry heaving. She states that she has been having regular bowel movements after taking Lasix for this. She denies any fever, chills, chest pain, shortness of breath, diarrhea, constipation. Surgeon: Excell Seltzer Past Medical History  Diagnosis Date  . Hyperlipidemia   . Unspecified essential hypertension   . Shingles   . Obesity   . Hypothyroidism   . Diabetes mellitus type II   . Nonspecific abnormal unspecified cardiovascular function study   . Foot pain   . Renal transplant disorder   . Peripheral vascular disease   . PAD (peripheral artery disease)   . Diabetic peripheral neuropathy   . Anemia   . Gout   . Pneumonia 09/2012  . GERD (gastroesophageal reflux disease)   . Migraines     "q 3 months" (07/24/2013)  . Chronic kidney disease, stage IV (severe)     "never went on dialysis" (07/24/2013)  . Peripheral neuropathy   . Sleep apnea     "getting ready to get tested again cause dr says I have it" (07/24/2013) pt states was tested and was told she did not have sleep  apnea / no cpap  . Dyspnea on exertion     pt denies  . History of chicken pox   . History of measles   . History of mumps   . Dialysis patient     Monday Wednesday Friday   . Arthritis     "hands" (07/24/2013) pt denies   Past Surgical History  Procedure Laterality Date  . Av fistula placement, radiocephalic Right [id number]    "upper arm" (07/24/2013)  . Cesarean section  1986; 1987  . Endometrial cryoablation  1980's  . Colonoscopy    . Av fistula placement Right 2000    "lower arm" (07/24/2013)  . Transplantation renal  2013    "right" (09/04/2012)  . Cholecystectomy  2011  . Varicose vein surgery  1997    "RLE; stripped" (09/04/2012)  . Video bronchoscopy  09/07/2012    Procedure: VIDEO BRONCHOSCOPY WITHOUT FLUORO;  Surgeon: Chesley Mires, MD;  Location: [facility] ENDOSCOPY;  Service: Cardiopulmonary;  Laterality: Bilateral;  . Tubal ligation  1987  . Cataract extraction w/ intraocular lens  implant, bilateral Bilateral 2011  . Intrauterine device (iud) insertion  2011  . Laparoscopic gastric sleeve resection N/A 05/26/2015    Procedure: LAPAROSCOPIC GASTRIC SLEEVE RESECTION;  Surgeon: Excell Seltzer, MD;  Location: [facility] ORS;  Service: General;  Laterality: N/A;   Family History  Problem Relation Age of Onset  . Colon cancer Father     deceased at age 52   Social History  Substance Use Topics  . Smoking status: Never Smoker   . Smokeless tobacco: Never Used  . Alcohol Use: No   OB History    No data available     Review of Systems  Constitutional: Negative for fever and chills.  Gastrointestinal: Positive for nausea, vomiting and abdominal pain. Negative for diarrhea, constipation and blood in stool.  All other systems reviewed and are negative.     Allergies  Hydrocodone-acetaminophen and Propoxyphene n-acetaminophen  Home Medications   Prior to Admission medications   Medication Sig Start Date End Date Taking? Authorizing Provider  allopurinol (ZYLOPRIM) 100 MG  tablet Take 100 mg by mouth every morning.    Yes Historical Provider, MD  allopurinol (ZYLOPRIM) 300 MG tablet Take 300 mg by mouth daily. 04/15/15  Yes Historical Provider, MD  colchicine 0.6 MG tablet Take 1 tablet by mouth daily as needed (gout).  05/09/15  Yes Historical Provider, MD  ergocalciferol (VITAMIN D2) 50000 UNITS capsule Take 50,000 Units by mouth every 30 (thirty) days. On the 1st of each month   Yes Historical Provider, MD  gabapentin (NEURONTIN) 300 MG capsule Take 300 mg by mouth 3 (three) times daily. 04/23/15  Yes Historical Provider, MD  hydrOXYzine (ATARAX/VISTARIL) 25 MG tablet Take 1 tablet by mouth 3 (three) times daily as needed. Itching. 04/08/15  Yes Historical Provider, MD  insulin aspart (NOVOLOG FLEXPEN) 100 UNIT/ML FlexPen Inject 10 Units into the skin 3 (three) times daily with meals. 05/28/15  Yes Excell Seltzer, MD  levothyroxine (SYNTHROID) 50 MCG tablet Take 50 mcg by mouth every morning.    Yes Historical Provider, MD  Multiple Vitamins-Minerals (ALIVE WOMENS ENERGY) TABS Take 1 tablet by mouth daily.   Yes Historical Provider, MD  oxyCODONE (ROXICODONE) 5 MG/5ML solution Take 5-10 mLs by mouth every 4 (four) hours as needed for severe pain.  05/24/15  Yes Historical Provider, MD  oxyCODONE-acetaminophen (PERCOCET/ROXICET) 5-325 MG per tablet Take 1 tablet by mouth every 4 (four) hours as needed for severe pain. 03/31/15  Yes Jeffrey Hedges, PA-C  pregabalin (LYRICA) 75 MG capsule Take 225 mg by mouth at bedtime.  11/03/12  Yes Historical Provider, MD  sennosides-docusate sodium (SENOKOT-S) 8.6-50 MG tablet Take 1 tablet by mouth at bedtime.    Yes Historical Provider, MD  sevelamer carbonate (RENVELA) 800 MG tablet Take 1,600 mg by mouth 3 (three) times daily with meals.   Yes Historical Provider, MD  ondansetron (ZOFRAN) 4 MG tablet Take 1 tablet (4 mg total) by mouth every 6 (six) hours. 06/03/15   [name] Patel-Mills, PA-C   BP 147/61 mmHg  Pulse 86  Temp(Src) 98.3  F (36.8 C) (Oral)  Resp 18  SpO2 100% Physical Exam  Constitutional: She is oriented to person, place, and time. She appears well-developed and well-nourished.  HENT:  Head: Normocephalic and atraumatic.  Eyes: Conjunctivae are normal.  Neck: Normal range of motion. Neck supple.  Cardiovascular: Normal rate, regular rhythm and normal heart sounds.   Pulmonary/Chest: Effort normal and breath sounds normal. No respiratory distress. She has no wheezes. She has no rales.  Abdominal: Soft. She exhibits no distension. There is tenderness in the epigastric area. There is no rebound and no guarding.  Several small surgical incisions without surrounding erythema or drainage. They appear to be healing well. Morbidly obese abdomen. Mild epigastric tenderness to palpation.  Musculoskeletal: Normal range of motion.  Neurological: She is alert and oriented to person, place, and time.  Skin: Skin is warm and dry.  Nursing note and vitals reviewed.   ED Course  Procedures (including critical care time) Labs Review Labs Reviewed  COMPREHENSIVE METABOLIC PANEL - Abnormal; Notable for the following:    Chloride 93 (*)    Glucose, Bld 120 (*)    Creatinine, Ser 5.69 (*)    Total Protein 8.5 (*)    GFR calc non Af Amer 8 (*)    GFR calc Af Amer 9 (*)    All other components within normal limits  CBC - Abnormal; Notable for the following:    WBC 11.6 (*)    RBC 3.50 (*)    Hemoglobin 11.3 (*)    HCT 34.2 (*)    All other components within normal limits  LIPASE, BLOOD - Abnormal; Notable for the following:    Lipase 12 (*)    All other components within normal limits    Imaging Review Dg Abd 1 View  06/03/2015   CLINICAL DATA:  Gastric sleeve surgery 05/26/2015 with persistent nausea and vomiting  EXAM: ABDOMEN - 1 VIEW  COMPARISON:  05/25/2015  FINDINGS: The bowel gas pattern is normal. No radio-opaque calculi or other significant radiographic abnormality are seen. Retained contrast noted  within the otherwise normal appearing colon. Cholecystectomy clips are noted. Epigastric clips are present as well as pelvic clips and IUD.  IMPRESSION: Normal bowel gas pattern.   Electronically Signed   By: Conchita Paris M.D.   On: 06/03/2015 22:14   I have personally reviewed and evaluated these images and lab results as part of my medical decision-making.   EKG Interpretation None      MDM   Final diagnoses:  Non-intractable vomiting with nausea, vomiting of unspecified type  Patient presents from dialysis after one episode of vomiting last night and nausea today. She was able to finish dialysis. She had gastric sleeve placement 1 week ago. She has mild epigastric tenderness. She is well appearing and in no acute distress. Her labs are not concerning. She has no herniation on the abd xray.  Recheck: Feeling much better after zofran. I spoke to general surgery who stated that this would be expected after this type of surgery and that she was comfortable with the patient going home on zofran.  I spoke to the patient who agrees to follow up with her surgeon.  I also gave her return precautions and she agrees with the plan.    Ottie Glazier, PA-C 06/04/15 [place], MD 06/04/15 [phone]

## 2015-06-03 NOTE — ED Provider Notes (Signed)
  Face-to-face evaluation   History: She is here for 3 episodes of vomiting the last 24 hours. No diarrhea. Today at dialysis, the doctor was concerned so sent her here. Gastric sleeve surgery done last week.  Physical exam: Alert, obese female in no apparent distress. Abdomen is soft and nontender to palpation.    Medical screening examination/treatment/procedure(s) were conducted as a shared visit with non-physician practitioner(s) and myself.  I personally evaluated the patient during the encounter   Daleen Bo, MD 06/04/15 [phone]

## 2015-06-03 NOTE — ED Notes (Signed)
Pt to xray

## 2015-06-03 NOTE — ED Notes (Signed)
Pt states she had gastric reduction surgery last week. She is also a dialysis patient. Last night she began having nausea. It continued today with 3 episodes of emesis. At dialysis, her Dr. Rockey Situ her to come here to be seen

## 2015-06-03 NOTE — ED Notes (Signed)
Pt reminded of the need for urine, however states she is still unable to provide a sample at this time.

## 2015-06-09 ENCOUNTER — Ambulatory Visit: Payer: Self-pay

## 2015-07-14 ENCOUNTER — Encounter: Payer: Medicare HMO | Attending: General Surgery | Admitting: Dietician

## 2015-07-14 ENCOUNTER — Encounter: Payer: Self-pay | Admitting: Dietician

## 2015-07-14 DIAGNOSIS — Z6839 Body mass index (BMI) 39.0-39.9, adult: Secondary | ICD-10-CM | POA: Insufficient documentation

## 2015-07-14 DIAGNOSIS — E669 Obesity, unspecified: Secondary | ICD-10-CM | POA: Insufficient documentation

## 2015-07-14 DIAGNOSIS — Z713 Dietary counseling and surveillance: Secondary | ICD-10-CM | POA: Insufficient documentation

## 2015-07-14 NOTE — Progress Notes (Signed)
  Follow-up visit:  6 Weeks Post-Operative Sleeve Gastrectomy Surgery  Medical Nutrition Therapy:  Appt start time: [id number] end time:  [id number].  Primary concerns today: Post-operative Bariatric Surgery Nutrition Management.  Naiomy returns today for her first visit at [facility] since bariatric surgery having lost another 20 pounds. She receives dialysis Mondays, Wednesdays, and Fridays. She has been drinking Premier protein shake because they keep her more full. She reports that she understands that this is not recommended with kidney disease. Had a lot of vomiting for 4-5 days and is feeling better now.   Non scale victories: Able to exercise, playing with grandchildren at the park Non scale goal: size 12  Dry weight: 107 kg (down from 120 kg)  Surgery date: 05/26/15 Surgery type: Sleeve Gastrectomy Start weight at [facility]: 268 lbs on 08/16/14 Weight today: 235.5 lbs Weight change: 20 lbs Total weight lost: 32.5 lbs   TANITA  BODY COMP RESULTS  04/27/15 07/14/15   BMI (kg/m^2) 37.7 34.8   Fat Mass (lbs) 116 113.5   Fat Free Mass (lbs) 139 122   Total Body Water (lbs) 101.5 89.5     Preferred Learning Style:   No preference indicated   Learning Readiness:   Ready  24-hr recall: Unable to determine  B (AM): Unjury protein shake - 2 scoops (42g) Snk (AM): 3 spoonfuls of oatmeal  L (PM):  Snk (PM):   D (PM): salad and shrimp or Premier protein shake  Snk (PM):   Fluid intake: <32 oz of protein shake; occasionally water at the gym (Fluid restriction of 32 oz) Estimated total protein intake: unable to determine  Medications: see list Supplementation: taking (no longer on Calcium per nephrologist)  CBG monitoring: 3x a day Average CBG per patient: 109-120 mg/dL Last patient reported A1c: had it tested today  Using straws: no Drinking while eating: no Hair loss: no Carbonated beverages: no N/V/D/C: constipation Dumping syndrome: none  Recent physical activity:  Treadmill  and water aerobics 5x a week  Progress Towards Goal(s):  In progress.  Handouts given during visit include:  Phase 3A lean proteins  Phase 3B lean protein and non starchy vegetables   Nutritional Diagnosis:  [place]-3.3 Overweight/obesity related to past poor dietary habits and physical inactivity as evidenced by patient w/ recent gastric sleeve surgery following dietary guidelines for continued weight loss.     Intervention:  Nutrition counseling provided. Discussed adding more solid protein foods and identified appropriate foods and protein shakes.   Teaching Method Utilized:  Visual Auditory Hands on  Barriers to learning/adherence to lifestyle change: none  Demonstrated degree of understanding via:  Teach Back   Monitoring/Evaluation:  Dietary intake, exercise, and body weight. Follow up in 1 months for 3 month post-op visit.

## 2015-07-14 NOTE — Patient Instructions (Addendum)
Goals:  Follow Phase 3B: High Protein + Non-Starchy Vegetables  Eat 3-6 small meals/snacks, every 3-5 hrs  Increase lean protein foods to meet 60g goal  Avoid drinking 15 minutes before, during and 30 minutes after eating  Aim for >30 min of physical activity daily   Breakfast: [place] sausage and/or eggs  Snacks: cheese and/or deli meat  Lunch and Dinner: chicken/ground beef/seafood with vegetables or beans; chicken/tuna/egg salad     Remember that you can keep having a protein shake if you need to  Keep practicing chewing thoroughly, taking tiny bites, and eating slowly  Surgery date: 05/26/15 Surgery type: Sleeve Gastrectomy Start weight at [facility]: 268 lbs on 08/16/14 Weight today: 235.5 lbs Weight change: 20 lbs Total weight lost: 32.5 lbs

## 2015-08-18 ENCOUNTER — Encounter: Payer: Self-pay | Admitting: Dietician

## 2015-08-18 ENCOUNTER — Encounter: Payer: Medicare HMO | Attending: Internal Medicine | Admitting: Dietician

## 2015-08-18 DIAGNOSIS — Z6834 Body mass index (BMI) 34.0-34.9, adult: Secondary | ICD-10-CM | POA: Insufficient documentation

## 2015-08-18 DIAGNOSIS — Z713 Dietary counseling and surveillance: Secondary | ICD-10-CM | POA: Insufficient documentation

## 2015-08-18 NOTE — Progress Notes (Signed)
  Follow-up visit:  3 months Post-Operative Sleeve Gastrectomy Surgery  Medical Nutrition Therapy:  Appt start time: 300 end time:  325  Primary concerns today: Post-operative Bariatric Surgery Nutrition Management.  Wanda [name] returns today for her first visit at [facility] since bariatric surgery having lost another 4.5 pounds. Still having some regurgitation, especially if she eats too much or if she drinks fluid with meals. More sensitive to salty foods. Insulin has been reduced but blood sugars are still low (sometimes running in the 50-60s). Working on eating protein foods more frequently and packing snacks for dialysis. Water aerobics 2x a week and Silver Sneakers 3x a week.   Samples provided and patient instructed on proper use: Unjury protein powder (vanilla - qty 1) Lot#: [id number] Exp: 04/2016  Unjury protein powder (unflavored - qty 1) Lot#: [id number] Exp: 04/2016  Non scale victories: Able to exercise, playing with grandchildren at the park Non scale goal: size 12  Dry weight: 103 kg  Surgery date: 05/26/15 Surgery type: Sleeve Gastrectomy Start weight at [facility]: 268 lbs on 08/16/14 Weight today: 231 lbs Weight change: 4.5 lbs Total weight lost: 37 lbs   TANITA  BODY COMP RESULTS  04/27/15 07/14/15 08/18/15   BMI (kg/m^2) 37.7 34.8 34.1   Fat Mass (lbs) 116 113.5 83   Fat Free Mass (lbs) 139 122 148   Total Body Water (lbs) 101.5 89.5 108.5     Preferred Learning Style:   No preference indicated   Learning Readiness:   Ready  24-hr recall: Unable to determine  B (AM): Unjury protein shake - 2 scoops (42g) Snk (AM):  L (PM): boiled egg and salad (7g) Snk (PM):  Deli meat with cheese and mustard (14g) D (PM): 1-2 oz baked chicken or 3 oz fish (7-21g)  Snk (PM):   Fluid intake: <32 oz of protein shake; occasionally water at the gym (Fluid restriction of 32 oz) Estimated total protein intake: 70-84 g/day  Medications: see list; "phosphorous pill changed to  powder" Supplementation: taking (no longer on Calcium per nephrologist)  CBG monitoring: 3x a day Average CBG per patient: 109-120 mg/dL Last patient reported A1c: 7.1% (from 11.5%) "My goal is 5.9%"  Using straws: no Drinking while eating: sometimes sips Hair loss: no Carbonated beverages: no N/V/D/C: constipation Dumping syndrome: none  Recent physical activity:  Treadmill and water aerobics 5x a week  Progress Towards Goal(s):  In progress.  Handouts given during visit include:  Choose a Meal (potassium/phosphorus food guide)   Nutritional Diagnosis:  [place]-3.3 Overweight/obesity related to past poor dietary habits and physical inactivity as evidenced by patient w/ recent gastric sleeve surgery following dietary guidelines for continued weight loss.     Intervention:  Nutrition counseling provided. Discussed adding more solid protein foods and identified appropriate foods and protein shakes.   Teaching Method Utilized:  Visual Auditory Hands on  Barriers to learning/adherence to lifestyle change: none  Demonstrated degree of understanding via:  Teach Back   Monitoring/Evaluation:  Dietary intake, exercise, and body weight. Follow up in 1 months for 4 month post-op visit.

## 2015-08-18 NOTE — Patient Instructions (Addendum)
Goals:  Follow Phase 3B: High Protein + Non-Starchy Vegetables  Eat 3-6 small meals/snacks, every 3-5 hrs  Increase lean protein foods to meet 60g goal  Avoid drinking 15 minutes before, during and 30 minutes after eating  Aim for >30 min of physical activity daily   Breakfast: [place] sausage and/or eggs  Snacks: cheese and/or deli meat  Lunch and Dinner: chicken/ground beef/seafood with vegetables or beans; chicken/tuna/egg salad     Remember that you can keep having a protein shake if you need to  Keep practicing chewing thoroughly, taking tiny bites, and eating slowly  Protein shakes: . Unjury protein powder . Pure Protein Whey powder (slightly high in carbs at 8g but 1g sugar) . GenePro protein powder . Syntrax Nectar powder   Surgery date: 05/26/15 Surgery type: Sleeve Gastrectomy Start weight at [facility] 268 lbs on 08/16/14 Weight today: 231 lbs Weight change: 4.5 lbs Total weight lost: 37 lbs

## 2015-10-01 ENCOUNTER — Ambulatory Visit: Payer: Self-pay | Admitting: Dietician

## 2015-10-13 ENCOUNTER — Encounter: Payer: Self-pay | Admitting: Neurology

## 2015-10-13 ENCOUNTER — Ambulatory Visit (INDEPENDENT_AMBULATORY_CARE_PROVIDER_SITE_OTHER): Payer: Medicare HMO | Admitting: Neurology

## 2015-10-13 VITALS — BP 157/81 | HR 80 | Ht 69.0 in | Wt 233.2 lb

## 2015-10-13 DIAGNOSIS — R5383 Other fatigue: Secondary | ICD-10-CM | POA: Diagnosis not present

## 2015-10-13 DIAGNOSIS — E538 Deficiency of other specified B group vitamins: Secondary | ICD-10-CM | POA: Diagnosis not present

## 2015-10-13 DIAGNOSIS — G629 Polyneuropathy, unspecified: Secondary | ICD-10-CM | POA: Diagnosis not present

## 2015-10-13 MED ORDER — OXYCODONE-ACETAMINOPHEN 10-325 MG PO TABS: 1.0000 | ORAL_TABLET | Freq: Four times a day (QID) | ORAL | Status: DC | PRN

## 2015-10-13 NOTE — Progress Notes (Signed)
[facility]    Provider:  Dr Jaynee Eagles Referring Provider: Dr. Jeneen Rinks [place] Primary Care Physician:  Myriam Jacobson, MD  CC:  Severe peripheral neuropathy  HPI:  Wanda [name] is a 55 y.o. female here as a referral from Dr. Mancel Bale for neuropathy secondary to uncontrolled DM in the past and ESRD on HD. She was morbidly obese and she has lost 73 pounds with a bariatric sleeve procedure. The neuropathy started over 2-3 years ago and worsening in the last 2-3 months. Worsening and severe 10/10 pain. She has been on Lyrica and it doesn't help. Tried Gabapentin in the past too. She has pins needle in the feet and it goes up to the knees. It goes to her hands. She has aching in the hands. She doesn't want to go to dialysis due to the pain it causes in her feet andhands. The pain is more severe after dialysis and the next day she is better. She feels the pain every day, it is continuous. Worse at night when in bed. She has throbbing. She has poor balance. She walked in the sand and burnt her feet and didn't even feel it. Not sleeping well because of the pain, it wakes her up at night. She has tried Lyrica, nortriptyline, tramadol. No falls. No other neurologic complaints or focal deficits. No weakness.   Reviewed notes, labs and imaging from outside physicians, which showed:  Note review show that patient was asmitted in August of 2016 for bariatric sleeve surgery. She is on the transplant list and is felt to be a much better transplant candidate at a lower weight. After extensive preoperative workup and discussion she elected to proceed with laparoscopic sleeve gastrectomy as surgical treatment for morbid obesity.Surgery was successful and she was discharged 05/2015.   Personally reviewed CT of the head images and agree with the following 08/2007: There is no evidence for acute hemorrhage, hydrocephalus, mass/mass-effect or abnormal extra-axial fluid collection. No definite  CT evidence for acute ischemia. The visualized portions of the paranasal sinuses are clear. No evidence for mastoid air cell effusion.  IMPRESSION:  No acute intracranial abnormality.  Review of Systems: Patient complains of symptoms per HPI as well as the following symptoms: weight loss, numbness, constipation, not enough sleep, itching, moles. Pertinent negatives per HPI. All others negative.   Social History   Social History  . Marital Status: Married    Spouse Name: [name]  . Number of Children: 2  . Years of Education: 14   Occupational History  . Retired- disabled    Social History Main Topics  . Smoking status: Never Smoker   . Smokeless tobacco: Never Used  . Alcohol Use: No  . Drug Use: No  . Sexual Activity: Yes   Other Topics Concern  . Not on file   Social History Narrative   Lives with husband and child.   Caffeine use: none    Family History  Problem Relation Age of Onset  . Colon cancer Father     deceased at age 66  . Diabetes Brother   . Neuropathy Neg Hx     Past Medical History  Diagnosis Date  . Hyperlipidemia   . Unspecified essential hypertension   . Shingles   . Obesity   . Hypothyroidism   . Diabetes mellitus type II   . Nonspecific abnormal unspecified cardiovascular function study   . Foot pain   . Renal transplant disorder   . Peripheral vascular disease ([place])   .  PAD (peripheral artery disease) ([place])   . Diabetic peripheral neuropathy ([place])   . Anemia   . Gout   . Pneumonia 09/2012  . GERD (gastroesophageal reflux disease)   . Migraines     "q 3 months" (07/24/2013)  . Chronic kidney disease, stage IV (severe) ([place])     "never went on dialysis" (07/24/2013)  . Peripheral neuropathy ([place])   . Sleep apnea     "getting ready to get tested again cause dr says I have it" (07/24/2013) pt states was tested and was told she did not have sleep apnea / no cpap  . Dyspnea on exertion     pt denies  . History of chicken pox   .  History of measles   . History of mumps   . Dialysis patient [facility])     Monday Wednesday Friday   . Arthritis     "hands" (07/24/2013) pt denies    Past Surgical History  Procedure Laterality Date  . Av fistula placement, radiocephalic Right [id number]    "upper arm" (07/24/2013)  . Cesarean section  1986; 1987  . Endometrial cryoablation  1980's  . Colonoscopy    . Av fistula placement Right 2000    "lower arm" (07/24/2013)  . Transplantation renal  2013    "right" (09/04/2012)  . Cholecystectomy  2011  . Varicose vein surgery  1997    "RLE; stripped" (09/04/2012)  . Video bronchoscopy  09/07/2012    Procedure: VIDEO BRONCHOSCOPY WITHOUT FLUORO;  Surgeon: Chesley Mires, MD;  Location: [facility] ENDOSCOPY;  Service: Cardiopulmonary;  Laterality: Bilateral;  . Tubal ligation  1987  . Cataract extraction w/ intraocular lens  implant, bilateral Bilateral 2011  . Intrauterine device (iud) insertion  2011  . Laparoscopic gastric sleeve resection N/A 05/26/2015    Procedure: LAPAROSCOPIC GASTRIC SLEEVE RESECTION;  Surgeon: Excell Seltzer, MD;  Location: [facility] ORS;  Service: General;  Laterality: N/A;    Current Outpatient Prescriptions  Medication Sig Dispense Refill  . allopurinol (ZYLOPRIM) 100 MG tablet Take 100 mg by mouth every morning.     [name] Kitchen allopurinol (ZYLOPRIM) 300 MG tablet Take 300 mg by mouth daily.  4  . colchicine 0.6 MG tablet Take 1 tablet by mouth daily as needed (gout).   6  . ergocalciferol (VITAMIN D2) 50000 UNITS capsule Take 50,000 Units by mouth every 30 (thirty) days. On the 1st of each month    . gabapentin (NEURONTIN) 300 MG capsule Take 300 mg by mouth 3 (three) times daily.  0  . insulin aspart (NOVOLOG FLEXPEN) 100 UNIT/ML FlexPen Inject 10 Units into the skin 3 (three) times daily with meals. 15 mL 11  . levothyroxine (SYNTHROID, LEVOTHROID) 100 MCG tablet Take 100 mcg by mouth daily.  4  . Multiple Vitamins-Minerals (ALIVE WOMENS ENERGY) TABS Take 1 tablet by mouth daily.     . pantoprazole (PROTONIX) 20 MG tablet Take 20 mg by mouth daily.  3  . pregabalin (LYRICA) 75 MG capsule Take 225 mg by mouth at bedtime.     . sennosides-docusate sodium (SENOKOT-S) 8.6-50 MG tablet Take 1 tablet by mouth at bedtime.     [name] Kitchen oxyCODONE-acetaminophen (PERCOCET) 10-325 MG tablet Take 1 tablet by mouth every 6 (six) hours as needed for pain. 60 tablet 0  . RENVELA 2.4 g PACK TAKE 2 PACKETS BY MOUTH WITH MEALS 3 TIMES A DAY AND WITH SNACKS TWICE A DAY  6   No current facility-administered medications for this visit.  Allergies as of 10/13/2015 - Review Complete 10/13/2015  Allergen Reaction Noted  . Hydrocodone-acetaminophen Other (See Comments)   . Propoxyphene n-acetaminophen Other (See Comments)     Vitals: BP 157/81 mmHg  Pulse 80  Ht 5\' 9"  (1.753 m)  Wt 233 lb 3.2 oz (105.779 kg)  BMI 34.42 kg/m2 Last Weight:  Wt Readings from Last 1 Encounters:  10/13/15 233 lb 3.2 oz (105.779 kg)   Last Height:   Ht Readings from Last 1 Encounters:  10/13/15 5\' 9"  (1.753 m)    Physical exam: Exam: Gen: NAD, conversant, well nourised, obese, well groomed                     CV: RRR, no MRG. No Carotid Bruits. No peripheral edema, warm, nontender Eyes: Conjunctivae clear without exudates or hemorrhage  Neuro: Detailed Neurologic Exam  Speech:    Speech is normal; fluent and spontaneous with normal comprehension.  Cognition:    The patient is oriented to person, place, and time;     recent and remote memory intact;     language fluent;     normal attention, concentration,     fund of knowledge Cranial Nerves:    The pupils are equal, round, and reactive to light. The fundi are flat. Visual fields are full to finger confrontation. Extraocular movements are intact. Trigeminal sensation is intact and the muscles of mastication are normal. The face is symmetric. The palate elevates in the midline. Hearing intact. Voice is normal. Shoulder shrug is normal. The tongue  has normal motion without fasciculations.   Coordination:    Normal finger to nose and heel to shin. Normal rapid alternating movements.   Gait:    Heel-toe and tandem gait are normal.   Motor Observation:    No asymmetry, no atrophy, and no involuntary movements noted. Tone:    Normal muscle tone.    Posture:    Posture is normal. normal erect    Strength:    Strength is V/V in the upper and lower limbs.      Sensation: Hyperesthesias and dysesthesias in the feet.  decr pin prick distally in a gradient fashion to the knees and elbows Absent vibration and proprioception at the great toes.       Reflex Exam:  DTR's: Absent AJs    Toes:    The toes are downgoing bilaterally.   Clonus:    Clonus is absent.    Assessment/Plan:  55 year old patient with know severe neuropathy due to previously uncontrolled DM (HgA1c 12.8 3 years ago) and ESRD on HD. Will perform a serum neuropathy panel to look for other treatable causes of peripheral neuropathy. She has many medication and was sent here for pain management per patient. She has tried Lyrica, tramadol, TCAs, neurontin and other medications. Had a long talk with patient about possible treatment and she feels ipioids would be best for her. We do not provide chronic opiate pain management, will give her a limited supply for several months and refer to pain clinic.   Sarina Ill, MD  [facility] [address] [place], [place] [address]  Phone [phone] Fax [phone]

## 2015-10-13 NOTE — Patient Instructions (Signed)
Remember to drink plenty of fluid, eat healthy meals and do not skip any meals. Try to eat protein with a every meal and eat a healthy snack such as fruit or nuts in between meals. Try to keep a regular sleep-wake schedule and try to exercise daily, particularly in the form of walking, 20-30 minutes a day, if you can.   As far as your medications are concerned, I would like to suggest: Percocet and refer to pain clinic  As far as diagnostic testing: labs  I would like to see you back as needed sooner if we need to. Please call us with any interim questions, concerns, problems, updates or refill requests.   Please also call us for any test results so we can go over those with you on the phone.  My clinical assistant and will answer any of your questions and relay your messages to me and also relay most of my messages to you.   Our phone number is [phone]. We also have an after hours call service for urgent matters and there is a physician on-call for urgent questions. For any emergencies you know to call 911 or go to the nearest emergency room

## 2015-10-14 ENCOUNTER — Encounter: Payer: Self-pay | Admitting: Neurology

## 2015-10-15 ENCOUNTER — Telehealth: Payer: Self-pay | Admitting: Neurology

## 2015-10-15 NOTE — Telephone Encounter (Signed)
Wanda [name] with Dr. Unice Bailey office is calling regarding a referral that they received from our office.  She states if the patient's primary insurance is [employer] their office does not accept that insurance and they would need to find another doctor.

## 2015-10-16 ENCOUNTER — Telehealth: Payer: Self-pay

## 2015-10-16 LAB — MULTIPLE MYELOMA PANEL, SERUM
Albumin SerPl Elph-Mcnc: 3.8 g/dL (ref 2.9–4.4)
Albumin/Glob SerPl: 1.1 (ref 0.7–1.7)
Alpha 1: 0.2 g/dL (ref 0.0–0.4)
Alpha2 Glob SerPl Elph-Mcnc: 0.7 g/dL (ref 0.4–1.0)
B-Globulin SerPl Elph-Mcnc: 1 g/dL (ref 0.7–1.3)
Gamma Glob SerPl Elph-Mcnc: 1.7 g/dL (ref 0.4–1.8)
Globulin, Total: 3.5 g/dL (ref 2.2–3.9)
IgA/Immunoglobulin A, Serum: 268 mg/dL (ref 87–352)
IgG (Immunoglobin G), Serum: 1598 mg/dL (ref 700–1600)
IgM (Immunoglobulin M), Srm: 83 mg/dL (ref 26–217)
Total Protein: 7.3 g/dL (ref 6.0–8.5)

## 2015-10-16 LAB — VITAMIN B6: Vitamin B6: 29.1 ug/L (ref 2.0–32.8)

## 2015-10-16 LAB — VITAMIN B1: Thiamine: 159 nmol/L (ref 66.5–200.0)

## 2015-10-16 LAB — THYROID PANEL WITH TSH
Free Thyroxine Index: 1.4 (ref 1.2–4.9)
T3 Uptake Ratio: 26 % (ref 24–39)
T4, Total: 5.3 ug/dL (ref 4.5–12.0)
TSH: 5.16 u[IU]/mL — ABNORMAL HIGH (ref 0.450–4.500)

## 2015-10-16 LAB — B12 AND FOLATE PANEL
Folate: 7.9 ng/mL (ref >3.0)
Vitamin B-12: 924 pg/mL (ref 211–946)

## 2015-10-16 LAB — METHYLMALONIC ACID, SERUM: Methylmalonic Acid: 530 nmol/L — ABNORMAL HIGH (ref 0–378)

## 2015-10-16 NOTE — Telephone Encounter (Signed)
Pt returned call

## 2015-10-16 NOTE — Telephone Encounter (Signed)
Called and spoke to pt about lab results per Dr Jaynee Eagles note below. She will contact PCP. She is aware she needs to sign release form to have our office send records to her PCP since he was not referring provider. She verbalized understanding. Told her that her value was 5.160 (normal 0.45-4.5).

## 2015-10-16 NOTE — Telephone Encounter (Signed)
Called to discuss results with pt. Tried cell number but it doesn't have VM set up, unable to leave a message. Tried home number, but the person that answered stated Annica is unavailable because she is at dialysis.   Will try again later.

## 2015-10-16 NOTE — Telephone Encounter (Signed)
-----   Message from Melvenia Beam, MD sent at 10/16/2015  1:01 PM EST ----- Patient's TSH is a little elevated. It came back as 5.160 and the max normal is 4.5. Sometimes this indicates hypothyroidism. She should probably have this evaluated by pcp. Please forward findings to pcp or to patient. Otherwise labs are unremarkable.  thanks.

## 2015-10-21 NOTE — Telephone Encounter (Signed)
Because of patient's insurance I will send [facility] to [facility].  Patient is aware of details.

## 2015-10-29 ENCOUNTER — Ambulatory Visit: Payer: Self-pay | Admitting: Dietician

## 2015-11-24 ENCOUNTER — Telehealth: Payer: Self-pay | Admitting: Neurology

## 2015-11-24 NOTE — Telephone Encounter (Signed)
Wanda [name], called back to advise, they do not participate with [employer].

## 2015-11-25 NOTE — Telephone Encounter (Signed)
I have spoke to patient and relayed [facility] , [facility] is not taking New Patient's for pain [facility], [place] there [facility] group is not taking [employer] HMO . I have sent patient to [facility] . Patient understands and she is ok with this process. The [facility] does take her insurance and will call her with a apt. I have relayed to patient if she has not hurd from [employer] with in next couple of weeks to please call me.

## 2015-12-22 ENCOUNTER — Ambulatory Visit: Payer: Self-pay | Admitting: Dietician

## 2015-12-24 ENCOUNTER — Telehealth: Payer: Self-pay | Admitting: Neurology

## 2015-12-24 NOTE — Telephone Encounter (Signed)
Patient  Called the office and relayed she  Did not want to go to [facility] because she had apt 12/24/2015 Today at the [facility] . The lady at front desk told her when she checked in they did not take her insurance.   Patient relayed after waiting one hour to be seen after she left the office some one called her back from the [facility] relaying she did not pay her co pay. Patient relayed to [facility] I was not seen.  Patient does not want to go there.  I will send [facility] to [facility] For Pain in [place] she will see Levy Pupa - NP - Process will take about two weeks. Telephone [phone]. Fax - [phone] . Patient has there phone number. I spoke to Arbie Cookey there at [facility].

## 2016-02-24 DIAGNOSIS — F32A Depression, unspecified: Secondary | ICD-10-CM | POA: Insufficient documentation

## 2016-02-24 DIAGNOSIS — I4891 Unspecified atrial fibrillation: Secondary | ICD-10-CM | POA: Insufficient documentation

## 2016-02-24 DIAGNOSIS — F329 Major depressive disorder, single episode, unspecified: Secondary | ICD-10-CM | POA: Insufficient documentation

## 2016-02-24 DIAGNOSIS — I639 Cerebral infarction, unspecified: Secondary | ICD-10-CM | POA: Insufficient documentation

## 2016-07-05 ENCOUNTER — Encounter (HOSPITAL_COMMUNITY): Payer: Self-pay | Admitting: Emergency Medicine

## 2016-07-05 ENCOUNTER — Emergency Department (HOSPITAL_COMMUNITY)
Admission: EM | Admit: 2016-07-05 | Discharge: 2016-07-05 | Disposition: A | Discharge status: Home / Self Care | Payer: Medicare HMO | Attending: Emergency Medicine | Admitting: Emergency Medicine

## 2016-07-05 DIAGNOSIS — M25561 Pain in right knee: Secondary | ICD-10-CM | POA: Diagnosis present

## 2016-07-05 DIAGNOSIS — N184 Chronic kidney disease, stage 4 (severe): Secondary | ICD-10-CM | POA: Insufficient documentation

## 2016-07-05 DIAGNOSIS — M10062 Idiopathic gout, left knee: Secondary | ICD-10-CM | POA: Diagnosis not present

## 2016-07-05 DIAGNOSIS — M10061 Idiopathic gout, right knee: Secondary | ICD-10-CM | POA: Insufficient documentation

## 2016-07-05 DIAGNOSIS — E039 Hypothyroidism, unspecified: Secondary | ICD-10-CM | POA: Diagnosis not present

## 2016-07-05 DIAGNOSIS — E1122 Type 2 diabetes mellitus with diabetic chronic kidney disease: Secondary | ICD-10-CM | POA: Diagnosis not present

## 2016-07-05 DIAGNOSIS — Z992 Dependence on renal dialysis: Secondary | ICD-10-CM | POA: Insufficient documentation

## 2016-07-05 DIAGNOSIS — E1140 Type 2 diabetes mellitus with diabetic neuropathy, unspecified: Secondary | ICD-10-CM | POA: Insufficient documentation

## 2016-07-05 DIAGNOSIS — M109 Gout, unspecified: Secondary | ICD-10-CM

## 2016-07-05 DIAGNOSIS — I129 Hypertensive chronic kidney disease with stage 1 through stage 4 chronic kidney disease, or unspecified chronic kidney disease: Secondary | ICD-10-CM | POA: Diagnosis not present

## 2016-07-05 DIAGNOSIS — Z794 Long term (current) use of insulin: Secondary | ICD-10-CM | POA: Insufficient documentation

## 2016-07-05 MED ORDER — OXYCODONE-ACETAMINOPHEN 5-325 MG PO TABS: 1.0000 | ORAL_TABLET | Freq: Four times a day (QID) | ORAL | 0 refills | Status: DC | PRN

## 2016-07-05 NOTE — ED Provider Notes (Signed)
[facility] DEPT Provider Note   CSN: [id number] Arrival date & time: 07/05/16  1932  By signing my name below, I, Royce Macadamia, attest that this documentation has been prepared under the direction and in the presence of  Montine Circle, PA-C. Electronically Signed: Royce Macadamia, ED Scribe. 07/05/16. 8:12 PM.  History   Chief Complaint Chief Complaint  Patient presents with  . Knee Pain   The history is provided by the patient and medical records. No language interpreter was used.    HPI Comments:  Wanda [name] is a 54 y.o. female with a history of arthritis and gout who presents to the Emergency Department complaining of acute on chronic knee pain x 3 days.  Pt reports that this feels worse than her typical arthritis and feels more similar to gout.  Pt reports that she's had gout in her bilateral knees in the past.  She adds that her pain seemed a little worse after eating hot dogs.  Pt contacted her nephrologist and he instructed her to come to the ED.  She is scheduled to see a new pain management physician on October 12th.  She takes allopurinol and colchicine for her gout, and has taken both without relief.     Past Medical History:  Diagnosis Date  . Anemia   . Arthritis    "hands" (07/24/2013) pt denies  . Chronic kidney disease, stage IV (severe) ([place])    "never went on dialysis" (07/24/2013)  . Diabetes mellitus type II   . Diabetic peripheral neuropathy ([place])   . Dialysis patient [facility])    Monday Wednesday Friday   . Dyspnea on exertion    pt denies  . Foot pain   . GERD (gastroesophageal reflux disease)   . Gout   . History of chicken pox   . History of measles   . History of mumps   . Hyperlipidemia   . Hypothyroidism   . Migraines    "q 3 months" (07/24/2013)  . Nonspecific abnormal unspecified cardiovascular function study   . Obesity   . PAD (peripheral artery disease) ([place])   . Peripheral neuropathy ([place])   . Peripheral vascular  disease ([place])   . Pneumonia 09/2012  . Renal transplant disorder   . Shingles   . Sleep apnea    "getting ready to get tested again cause dr says I have it" (07/24/2013) pt states was tested and was told she did not have sleep apnea / no cpap  . Unspecified essential hypertension     Patient Active Problem List   Diagnosis Date Noted  . Morbid obesity ([place]) 05/26/2015  . Hypoglycemia 07/24/2013  . Nausea and vomiting 01/08/2013  . Fever 01/08/2013  . Renal failure (ARF), acute on chronic (HCC) 01/08/2013  . Renal transplant disorder 09/04/2012  . Hyponatremia 09/04/2012  . CKD (chronic kidney disease) 09/04/2012  . HTN (hypertension) 09/04/2012  . Healthcare-associated pneumonia 09/04/2012  . Anemia 09/04/2012  . Cough 03/22/2011  . SHINGLES 12/21/2009  . HYPOTHYROIDISM 12/21/2009  . DIABETES MELLITUS, TYPE II 12/21/2009  . HYPERLIPIDEMIA 12/21/2009  . OBESITY 12/21/2009  . DYSPNEA 12/21/2009  . MIGRAINES, HX OF 12/21/2009  . HYPERTENSION, UNSPECIFIED 11/20/2009  . CHRONIC KIDNEY DISEASE STAGE IV (SEVERE) 11/20/2009  . DYSPNEA ON EXERTION 11/20/2009  . NONSPECIFIC ABNORMAL UNSPEC CV FUNCTION STUDY 11/20/2009    Past Surgical History:  Procedure Laterality Date  . AV FISTULA PLACEMENT Right 2000   "lower arm" (07/24/2013)  . AV FISTULA PLACEMENT, RADIOCEPHALIC Right 8563   "  upper arm" (07/24/2013)  . CATARACT EXTRACTION W/ INTRAOCULAR LENS  IMPLANT, BILATERAL Bilateral 2011  . [place]; 1987  . CHOLECYSTECTOMY  2011  . COLONOSCOPY    . ENDOMETRIAL CRYOABLATION  1980's  . INTRAUTERINE DEVICE (IUD) INSERTION  2011  . LAPAROSCOPIC GASTRIC SLEEVE RESECTION N/A 05/26/2015   Procedure: LAPAROSCOPIC GASTRIC SLEEVE RESECTION;  Surgeon: Excell Seltzer, MD;  Location: [facility] ORS;  Service: General;  Laterality: N/A;  . TRANSPLANTATION RENAL  2013   "right" (09/04/2012)  . TUBAL LIGATION  1987  . [place]   "RLE; stripped" (09/04/2012)  . VIDEO  BRONCHOSCOPY  09/07/2012   Procedure: VIDEO BRONCHOSCOPY WITHOUT FLUORO;  Surgeon: Chesley Mires, MD;  Location: [facility] ENDOSCOPY;  Service: Cardiopulmonary;  Laterality: Bilateral;    OB History    No data available       Home Medications    Prior to Admission medications   Medication Sig Start Date End Date Taking? Authorizing Provider  allopurinol (ZYLOPRIM) 100 MG tablet Take 100 mg by mouth every morning.     Historical Provider, MD  allopurinol (ZYLOPRIM) 300 MG tablet Take 300 mg by mouth daily. 04/15/15   Historical Provider, MD  colchicine 0.6 MG tablet Take 1 tablet by mouth daily as needed (gout).  05/09/15   Historical Provider, MD  ergocalciferol (VITAMIN D2) 50000 UNITS capsule Take 50,000 Units by mouth every 30 (thirty) days. On the 1st of each month    Historical Provider, MD  gabapentin (NEURONTIN) 300 MG capsule Take 300 mg by mouth 3 (three) times daily. 04/23/15   Historical Provider, MD  insulin aspart (NOVOLOG FLEXPEN) 100 UNIT/ML FlexPen Inject 10 Units into the skin 3 (three) times daily with meals. 05/28/15   Excell Seltzer, MD  levothyroxine (SYNTHROID, LEVOTHROID) 100 MCG tablet Take 100 mcg by mouth daily. 08/03/15   Historical Provider, MD  Multiple Vitamins-Minerals (ALIVE WOMENS ENERGY) TABS Take 1 tablet by mouth daily.    Historical Provider, MD  oxyCODONE-acetaminophen (PERCOCET/ROXICET) 5-325 MG tablet Take 1-2 tablets by mouth every 6 (six) hours as needed for severe pain. 07/05/16   Montine Circle, PA-C  pantoprazole (PROTONIX) 20 MG tablet Take 20 mg by mouth daily. 09/01/15   Historical Provider, MD  pregabalin (LYRICA) 75 MG capsule Take 225 mg by mouth at bedtime.  11/03/12   Historical Provider, MD  RENVELA 2.4 g PACK TAKE 2 PACKETS BY MOUTH WITH MEALS 3 TIMES A DAY AND WITH SNACKS TWICE A DAY 07/24/15   Historical Provider, MD  sennosides-docusate sodium (SENOKOT-S) 8.6-50 MG tablet Take 1 tablet by mouth at bedtime.     Historical Provider, MD    Family  History Family History  Problem Relation Age of Onset  . Colon cancer Father     deceased at age 32  . Diabetes Brother   . Neuropathy Neg Hx     Social History Social History  Substance Use Topics  . Smoking status: Never Smoker  . Smokeless tobacco: Never Used  . Alcohol use No     Allergies   Hydrocodone-acetaminophen and Propoxyphene n-acetaminophen   Review of Systems Review of Systems  Musculoskeletal: Positive for arthralgias and myalgias.  Neurological: Negative for weakness.     Physical Exam Updated Vital Signs BP 150/78 (BP Location: Left Arm)   Pulse 101   Temp 98.5 F (36.9 C) (Oral)   Resp 19   SpO2 100%   Physical Exam Physical Exam  Constitutional: Pt appears well-developed and well-nourished.  No distress.  HENT:  Head: Normocephalic and atraumatic.  Eyes: Conjunctivae are normal.  Neck: Normal range of motion.  Cardiovascular: Normal rate, regular rhythm and intact distal pulses.   Capillary refill < 3 sec  Pulmonary/Chest: Effort normal and breath sounds normal.  Musculoskeletal: Pt exhibits tenderness to palpation diffusely, no bony abnormality or deformity. Pt exhibits no edema.  ROM: 4/5 limited by pain  Neurological: Pt  is alert. Coordination normal.  Sensation 5/5 Strength 4/5 limited by pain  Skin: Skin is warm and dry. Pt is not diaphoretic.  No tenting of the skin No erythema  Psychiatric: Pt has a normal mood and affect.  Nursing note and vitals reviewed.  ED Treatments / Results   DIAGNOSTIC STUDIES:  Oxygen Saturation is 100% on RA, NML by my interpretation.    COORDINATION OF CARE:  8:10 PM Will give pain medication for treatment of gout flair up.  Discussed treatment plan with pt at bedside and pt agreed to plan.  Labs (all labs ordered are listed, but only abnormal results are displayed) Labs Reviewed - No data to display  EKG  EKG Interpretation None       Radiology No results  found.  Procedures Procedures (including critical care time)  Medications Ordered in ED Medications - No data to display   Initial Impression / Assessment and Plan / ED Course  I have reviewed the triage vital signs and the nursing notes.  Pertinent labs & imaging results that were available during my care of the patient were reviewed by me and considered in my medical decision making (see chart for details).  Clinical Course    Patient with acute on chronic bilateral knee pain thought to be secondary to gout flare.  Patient is taking colchicine and allopurinol.  Will give some percocet.  Doubt septic joint as it is involving both knees.  Afebrile.  VSS.  Final Clinical Impressions(s) / ED Diagnoses   Final diagnoses:  Gout of multiple sites, unspecified cause, unspecified chronicity    New Prescriptions New Prescriptions   OXYCODONE-ACETAMINOPHEN (PERCOCET/ROXICET) 5-325 MG TABLET    Take 1-2 tablets by mouth every 6 (six) hours as needed for severe pain.   I personally performed the services described in this documentation, which was scribed in my presence. The recorded information has been reviewed and is accurate.      Montine Circle, PA-C 07/05/16 2114    Milton Ferguson, MD 07/05/16 2312

## 2016-07-05 NOTE — ED Triage Notes (Signed)
Pt states "ive been suffering with gout in my legs for three days". Pt c/o R knee pain. Pt takes allopurinol. Pt sees a pain management doctor but cant see him until October, pt is out of pain medicine.

## 2016-07-05 NOTE — ED Triage Notes (Signed)
Pt was taking oxycodone for pain.

## 2016-07-31 ENCOUNTER — Emergency Department (HOSPITAL_COMMUNITY)
Admission: EM | Admit: 2016-07-31 | Discharge: 2016-07-31 | Disposition: A | Discharge status: Home / Self Care | Payer: 59 | Attending: Emergency Medicine | Admitting: Emergency Medicine

## 2016-07-31 ENCOUNTER — Encounter (HOSPITAL_COMMUNITY): Payer: Self-pay | Admitting: *Deleted

## 2016-07-31 ENCOUNTER — Emergency Department (HOSPITAL_COMMUNITY): Payer: 59

## 2016-07-31 DIAGNOSIS — N184 Chronic kidney disease, stage 4 (severe): Secondary | ICD-10-CM | POA: Insufficient documentation

## 2016-07-31 DIAGNOSIS — I129 Hypertensive chronic kidney disease with stage 1 through stage 4 chronic kidney disease, or unspecified chronic kidney disease: Secondary | ICD-10-CM | POA: Diagnosis not present

## 2016-07-31 DIAGNOSIS — K5900 Constipation, unspecified: Secondary | ICD-10-CM

## 2016-07-31 DIAGNOSIS — E1122 Type 2 diabetes mellitus with diabetic chronic kidney disease: Secondary | ICD-10-CM | POA: Insufficient documentation

## 2016-07-31 DIAGNOSIS — R112 Nausea with vomiting, unspecified: Secondary | ICD-10-CM | POA: Diagnosis not present

## 2016-07-31 DIAGNOSIS — E876 Hypokalemia: Secondary | ICD-10-CM | POA: Diagnosis not present

## 2016-07-31 DIAGNOSIS — E1140 Type 2 diabetes mellitus with diabetic neuropathy, unspecified: Secondary | ICD-10-CM | POA: Diagnosis not present

## 2016-07-31 DIAGNOSIS — E039 Hypothyroidism, unspecified: Secondary | ICD-10-CM | POA: Diagnosis not present

## 2016-07-31 DIAGNOSIS — Z794 Long term (current) use of insulin: Secondary | ICD-10-CM | POA: Insufficient documentation

## 2016-07-31 DIAGNOSIS — R109 Unspecified abdominal pain: Secondary | ICD-10-CM

## 2016-07-31 LAB — COMPREHENSIVE METABOLIC PANEL
ALT: 13 U/L — ABNORMAL LOW (ref 14–54)
AST: 17 U/L (ref 15–41)
Albumin: 1.8 g/dL — ABNORMAL LOW (ref 3.5–5.0)
Alkaline Phosphatase: 84 U/L (ref 38–126)
Anion gap: 10 (ref 5–15)
BUN: 17 mg/dL (ref 6–20)
CO2: 29 mmol/L (ref 22–32)
Calcium: 8.6 mg/dL — ABNORMAL LOW (ref 8.9–10.3)
Chloride: 95 mmol/L — ABNORMAL LOW (ref 101–111)
Creatinine, Ser: 6.7 mg/dL — ABNORMAL HIGH (ref 0.44–1.00)
GFR calc Af Amer: 7 mL/min — ABNORMAL LOW (ref >60)
GFR calc non Af Amer: 6 mL/min — ABNORMAL LOW (ref >60)
Glucose, Bld: 218 mg/dL — ABNORMAL HIGH (ref 65–99)
Potassium: 2.7 mmol/L — CL (ref 3.5–5.1)
Sodium: 134 mmol/L — ABNORMAL LOW (ref 135–145)
Total Bilirubin: 0.2 mg/dL — ABNORMAL LOW (ref 0.3–1.2)
Total Protein: 6 g/dL — ABNORMAL LOW (ref 6.5–8.1)

## 2016-07-31 LAB — CBC
HCT: 34.3 % — ABNORMAL LOW (ref 36.0–46.0)
Hemoglobin: 11.6 g/dL — ABNORMAL LOW (ref 12.0–15.0)
MCH: 29.9 pg (ref 26.0–34.0)
MCHC: 33.8 g/dL (ref 30.0–36.0)
MCV: 88.4 fL (ref 78.0–100.0)
Platelets: 332 10*3/uL (ref 150–400)
RBC: 3.88 MIL/uL (ref 3.87–5.11)
RDW: 13.7 % (ref 11.5–15.5)
WBC: 10.2 10*3/uL (ref 4.0–10.5)

## 2016-07-31 LAB — MAGNESIUM: Magnesium: 1.7 mg/dL (ref 1.7–2.4)

## 2016-07-31 LAB — LIPASE, BLOOD: Lipase: 20 U/L (ref 11–51)

## 2016-07-31 MED ORDER — MINERAL OIL RE ENEM
1.0000 | ENEMA | Freq: Once | RECTAL | Status: AC
  Administered 2016-07-31: 1 via RECTAL
  Filled 2016-07-31 (×2): qty 1

## 2016-07-31 MED ORDER — ONDANSETRON 4 MG PO TBDP: 4.0000 mg | ORAL_TABLET | Freq: Three times a day (TID) | ORAL | 0 refills | Status: DC | PRN

## 2016-07-31 MED ORDER — POTASSIUM CHLORIDE 20 MEQ/15ML (10%) PO SOLN
40.0000 meq | Freq: Once | ORAL | Status: AC
  Administered 2016-07-31: 40 meq via ORAL
  Filled 2016-07-31: qty 30

## 2016-07-31 MED ORDER — PROMETHAZINE HCL 25 MG RE SUPP: 25.0000 mg | Freq: Four times a day (QID) | RECTAL | 0 refills | Status: DC | PRN

## 2016-07-31 MED ORDER — PROMETHAZINE HCL 25 MG PO TABS: 25.0000 mg | ORAL_TABLET | Freq: Four times a day (QID) | ORAL | 0 refills | Status: DC | PRN

## 2016-07-31 MED ORDER — ONDANSETRON HCL 4 MG/2ML IJ SOLN
4.0000 mg | Freq: Once | INTRAMUSCULAR | Status: AC
Start: 2016-07-31 — End: 2016-07-31
  Administered 2016-07-31: 4 mg via INTRAVENOUS
  Filled 2016-07-31: qty 2

## 2016-07-31 MED ORDER — FENTANYL CITRATE (PF) 100 MCG/2ML IJ SOLN
100.0000 ug | Freq: Once | INTRAMUSCULAR | Status: AC
  Administered 2016-07-31: 100 ug via INTRAVENOUS
  Filled 2016-07-31: qty 2

## 2016-07-31 NOTE — ED Notes (Signed)
Report from Vicente Males, [place].  Pt in CT.

## 2016-07-31 NOTE — ED Provider Notes (Addendum)
[facility] DEPT Provider Note   CSN: [id number] Arrival date & time: 07/31/16  1233     History   Chief Complaint Chief Complaint  Patient presents with  . Abdominal Pain  . Constipation    HPI Wanda MCENANEY is a 55 y.o. female.  HPI  55 year old female with a history of chronic kidney disease on peritoneal dialysis presents with abdominal pain, constipation, and vomiting. Abdominal pain is upper and has been present for about one week. The constipation is acute on chronic. Typically she goes to the bathroom every 2 days. She has not had a bowel movement and 1-1/2 weeks. Vomits only after trying to eat. She feels full very quickly and has no appetite over the last 1 week. She has been doing her dialysis every day and has not noticed a change in the color or quality of the dialysate. No chest pain.  Past Medical History:  Diagnosis Date  . Anemia   . Arthritis    "hands" (07/24/2013) pt denies  . Chronic kidney disease, stage IV (severe) ([place])    "never went on dialysis" (07/24/2013)  . Diabetes mellitus type II   . Diabetic peripheral neuropathy ([place])   . Dialysis patient [facility])    Monday Wednesday Friday   . Dyspnea on exertion    pt denies  . Foot pain   . GERD (gastroesophageal reflux disease)   . Gout   . History of chicken pox   . History of measles   . History of mumps   . Hyperlipidemia   . Hypothyroidism   . Migraines    "q 3 months" (07/24/2013)  . Nonspecific abnormal unspecified cardiovascular function study   . Obesity   . PAD (peripheral artery disease) ([place])   . Peripheral neuropathy ([place])   . Peripheral vascular disease ([place])   . Pneumonia 09/2012  . Renal transplant disorder   . Shingles   . Sleep apnea    "getting ready to get tested again cause dr says I have it" (07/24/2013) pt states was tested and was told she did not have sleep apnea / no cpap  . Unspecified essential hypertension     Patient Active Problem List   Diagnosis Date  Noted  . Morbid obesity ([place]) 05/26/2015  . Hypoglycemia 07/24/2013  . Nausea and vomiting 01/08/2013  . Fever 01/08/2013  . Renal failure (ARF), acute on chronic (HCC) 01/08/2013  . Renal transplant disorder 09/04/2012  . Hyponatremia 09/04/2012  . CKD (chronic kidney disease) 09/04/2012  . HTN (hypertension) 09/04/2012  . Healthcare-associated pneumonia 09/04/2012  . Anemia 09/04/2012  . Cough 03/22/2011  . SHINGLES 12/21/2009  . HYPOTHYROIDISM 12/21/2009  . DIABETES MELLITUS, TYPE II 12/21/2009  . HYPERLIPIDEMIA 12/21/2009  . OBESITY 12/21/2009  . DYSPNEA 12/21/2009  . MIGRAINES, HX OF 12/21/2009  . HYPERTENSION, UNSPECIFIED 11/20/2009  . CHRONIC KIDNEY DISEASE STAGE IV (SEVERE) 11/20/2009  . DYSPNEA ON EXERTION 11/20/2009  . NONSPECIFIC ABNORMAL UNSPEC CV FUNCTION STUDY 11/20/2009    Past Surgical History:  Procedure Laterality Date  . AV FISTULA PLACEMENT Right 2000   "lower arm" (07/24/2013)  . AV FISTULA PLACEMENT, RADIOCEPHALIC Right 6213   "upper arm" (07/24/2013)  . CATARACT EXTRACTION W/ INTRAOCULAR LENS  IMPLANT, BILATERAL Bilateral 2011  . [place]; 1987  . CHOLECYSTECTOMY  2011  . COLONOSCOPY    . ENDOMETRIAL CRYOABLATION  1980's  . INTRAUTERINE DEVICE (IUD) INSERTION  2011  . LAPAROSCOPIC GASTRIC SLEEVE RESECTION N/A 05/26/2015   Procedure: LAPAROSCOPIC  GASTRIC SLEEVE RESECTION;  Surgeon: Excell Seltzer, MD;  Location: [facility] ORS;  Service: General;  Laterality: N/A;  . TRANSPLANTATION RENAL  2013   "right" (09/04/2012)  . TUBAL LIGATION  1987  . [place]   "RLE; stripped" (09/04/2012)  . VIDEO BRONCHOSCOPY  09/07/2012   Procedure: VIDEO BRONCHOSCOPY WITHOUT FLUORO;  Surgeon: Chesley Mires, MD;  Location[facility];  Service: Cardiopulmonary;  Laterality: Bilateral;    OB History    No data available       Home Medications    Prior to Admission medications   Medication Sig Start Date End Date Taking? Authorizing  Provider  allopurinol (ZYLOPRIM) 100 MG tablet Take 100 mg by mouth every morning.     Historical Provider, MD  allopurinol (ZYLOPRIM) 300 MG tablet Take 300 mg by mouth daily. 04/15/15   Historical Provider, MD  colchicine 0.6 MG tablet Take 1 tablet by mouth daily as needed (gout).  05/09/15   Historical Provider, MD  ergocalciferol (VITAMIN D2) 50000 UNITS capsule Take 50,000 Units by mouth every 30 (thirty) days. On the 1st of each month    Historical Provider, MD  gabapentin (NEURONTIN) 300 MG capsule Take 300 mg by mouth 3 (three) times daily. 04/23/15   Historical Provider, MD  insulin aspart (NOVOLOG FLEXPEN) 100 UNIT/ML FlexPen Inject 10 Units into the skin 3 (three) times daily with meals. 05/28/15   Excell Seltzer, MD  levothyroxine (SYNTHROID, LEVOTHROID) 100 MCG tablet Take 100 mcg by mouth daily. 08/03/15   Historical Provider, MD  Multiple Vitamins-Minerals (ALIVE WOMENS ENERGY) TABS Take 1 tablet by mouth daily.    Historical Provider, MD  ondansetron (ZOFRAN ODT) 4 MG disintegrating tablet Take 1 tablet (4 mg total) by mouth every 8 (eight) hours as needed for nausea or vomiting. 07/31/16   Sherwood Gambler, MD  oxyCODONE-acetaminophen (PERCOCET/ROXICET) 5-325 MG tablet Take 1-2 tablets by mouth every 6 (six) hours as needed for severe pain. 07/05/16   Montine Circle, PA-C  pantoprazole (PROTONIX) 20 MG tablet Take 20 mg by mouth daily. 09/01/15   Historical Provider, MD  pregabalin (LYRICA) 75 MG capsule Take 225 mg by mouth at bedtime.  11/03/12   Historical Provider, MD  promethazine (PHENERGAN) 25 MG suppository Place 1 suppository (25 mg total) rectally every 6 (six) hours as needed for nausea or vomiting. 07/31/16   Sherwood Gambler, MD  promethazine (PHENERGAN) 25 MG tablet Take 1 tablet (25 mg total) by mouth every 6 (six) hours as needed for nausea or vomiting. 07/31/16   Sherwood Gambler, MD  RENVELA 2.4 g PACK TAKE 2 PACKETS BY MOUTH WITH MEALS 3 TIMES A DAY AND WITH SNACKS TWICE A  DAY 07/24/15   Historical Provider, MD  sennosides-docusate sodium (SENOKOT-S) 8.6-50 MG tablet Take 1 tablet by mouth at bedtime.     Historical Provider, MD    Family History Family History  Problem Relation Age of Onset  . Colon cancer Father     deceased at age 23  . Diabetes Brother   . Neuropathy Neg Hx     Social History Social History  Substance Use Topics  . Smoking status: Never Smoker  . Smokeless tobacco: Never Used  . Alcohol use No     Allergies   Hydrocodone-acetaminophen and Propoxyphene n-acetaminophen   Review of Systems Review of Systems  Constitutional: Negative for fever.  Respiratory: Negative for shortness of breath.   Cardiovascular: Negative for chest pain.  Gastrointestinal: Positive for abdominal pain, constipation, nausea  and vomiting. Negative for diarrhea.  Genitourinary: Negative for dysuria.  All other systems reviewed and are negative.    Physical Exam Updated Vital Signs BP 154/81   Pulse 83   Temp 98.2 F (36.8 C) (Oral)   Resp 12   SpO2 99%   Physical Exam  Constitutional: She is oriented to person, place, and time. She appears well-developed and well-nourished. No distress.  HENT:  Head: Normocephalic and atraumatic.  Right Ear: External ear normal.  Left Ear: External ear normal.  Nose: Nose normal.  Eyes: Right eye exhibits no discharge. Left eye exhibits no discharge.  Cardiovascular: Normal rate, regular rhythm and normal heart sounds.   Pulmonary/Chest: Effort normal and breath sounds normal.  Abdominal: Soft. There is tenderness in the right upper quadrant and left lower quadrant.    Genitourinary:  Genitourinary Comments: Moderately firm stool in the rectum just at the tip of my finger. Does not feel consistent with impaction. No gross blood  Neurological: She is alert and oriented to person, place, and time.  Skin: Skin is warm and dry. She is not diaphoretic.  Nursing note and vitals reviewed.    ED  Treatments / Results  Labs (all labs ordered are listed, but only abnormal results are displayed) Labs Reviewed  COMPREHENSIVE METABOLIC PANEL - Abnormal; Notable for the following:       Result Value   Sodium 134 (*)    Potassium 2.7 (*)    Chloride 95 (*)    Glucose, Bld 218 (*)    Creatinine, Ser 6.70 (*)    Calcium 8.6 (*)    Total Protein 6.0 (*)    Albumin 1.8 (*)    ALT 13 (*)    Total Bilirubin 0.2 (*)    GFR calc non Af Amer 6 (*)    GFR calc Af Amer 7 (*)    All other components within normal limits  CBC - Abnormal; Notable for the following:    Hemoglobin 11.6 (*)    HCT 34.3 (*)    All other components within normal limits  BODY FLUID CULTURE  GRAM STAIN  LIPASE, BLOOD  MAGNESIUM  URINALYSIS, ROUTINE W REFLEX MICROSCOPIC (NOT AT [facility])  BODY FLUID CELL COUNT WITH DIFFERENTIAL    EKG  EKG Interpretation  Date/Time:  Sunday July 31 2016 15:48:54 EDT Ventricular Rate:  86 PR Interval:    QRS Duration: 105 QT Interval:  393 QTC Calculation: 471 R Axis:   67 Text Interpretation:  Sinus rhythm Baseline wander in lead(s) I III aVL T waves are not as peaked, otherwise no significant change since 2015 Confirmed by Mayley Lish MD, Avalene Sealy [phone]) on 07/31/2016 4:07:34 PM       Radiology Ct Abdomen Pelvis Wo Contrast  Result Date: 07/31/2016 CLINICAL DATA:  Left lower quadrant abdominal pain for 1 week. EXAM: CT ABDOMEN AND PELVIS WITHOUT CONTRAST TECHNIQUE: Multidetector CT imaging of the abdomen and pelvis was performed following the standard protocol without IV contrast. COMPARISON:  CT scan of December 22, 2013. FINDINGS: Lower chest: Visualized lung bases are unremarkable. Hepatobiliary: Status post cholecystectomy. No focal abnormality seen in the liver on these unenhanced images. Pancreas: Normal. Spleen: Normal. Adrenals/Urinary Tract: Adrenal glands appear normal. Bilateral renal atrophy is noted. 2.4 cm partially exophytic rounded density is seen arising from  lower pole of left kidney which is significantly increased in size compared to prior exam. No hydronephrosis or renal obstruction is noted. Urinary bladder is decompressed. No renal or ureteral calculi are  noted. Stomach/Bowel: Status post gastric surgery. There is no evidence of bowel obstruction. Residual contrast is seen throughout the colon. Vascular/Lymphatic: Atherosclerosis of abdominal aorta is noted without aneurysm formation. No significant adenopathy is noted. Reproductive: Uterus and ovaries are unremarkable. Other: Mild to moderate amount of fluid or ascites is noted. Tip of peritoneal dialysis catheter is seen in right lower quadrant. Musculoskeletal: No significant osseous abnormality is noted. IMPRESSION: Aortic atherosclerosis. Peritoneal dialysis catheter tip is noted in right lower quadrant. Mild to moderate amount of peritoneal fluid is noted consistent with peritoneal dialysis. 2.4 cm partially exophytic rounded density is seen arising from lower pole left kidney which is significantly increased in size compared to prior exam. It has average Hounsfield measurement of 24. While this may simply represent complex cyst, its rapid change in size is concerning for possible neoplasm, and further evaluation with ultrasound is recommended. Electronically Signed   By: Marijo Conception, M.D.   On: 07/31/2016 16:10    Procedures Procedures (including critical care time)  Medications Ordered in ED Medications  fentaNYL (SUBLIMAZE) injection 100 mcg (100 mcg Intravenous Given 07/31/16 1539)  ondansetron (ZOFRAN) injection 4 mg (4 mg Intravenous Given 07/31/16 1538)  mineral oil enema 1 enema (1 enema Rectal Given 07/31/16 1539)  potassium chloride 20 MEQ/15ML (10%) solution 40 mEq (40 mEq Oral Given 07/31/16 1611)     Initial Impression / Assessment and Plan / ED Course  I have reviewed the triage vital signs and the nursing notes.  Pertinent labs & imaging results that were available during  my care of the patient were reviewed by me and considered in my medical decision making (see chart for details).  Clinical Course  Comment By Time  Labs, CT w/o contrast, rectal exam. Fentanyl and zofran. Will get eval of dialysate to r/o SBP. Sherwood Gambler, MD 10/29 1357  D/w Dr. Joelyn Oms, given no fever and clear fluid he feels like she can f/u with PD nurse tomorrow. [place] now and f/u for labs tomorrow. Lactulose and miralax for the constipation  Sherwood Gambler, MD 10/29 1536    Patient vomited about 10 minutes after taking in the KCl. Unclear exactly how much she got although a sizable amount was back in the emesis. However to not potentially overdose her, I will have her discharge and follow-up with the dialysis nurse tomorrow as per the plan. A nurse from the renal floor never came down to draw her peritoneal dialysate. Given my suspicion for SBP is low given no fevers or cloudy dialysate, I think it is reasonable to wait until tomorrow when she sees her PD nurse. Discharge with Zofran and Phenergan. Discussed return precautions. Unclear exact cause of her abdominal pain. She was made aware of the renal cyst, follow-up with nephrology.  Final Clinical Impressions(s) / ED Diagnoses   Final diagnoses:  Abdominal pain, unspecified abdominal location  Nausea and vomiting in adult  Constipation, unspecified constipation type  Hypokalemia    New Prescriptions New Prescriptions   ONDANSETRON (ZOFRAN ODT) 4 MG DISINTEGRATING TABLET    Take 1 tablet (4 mg total) by mouth every 8 (eight) hours as needed for nausea or vomiting.   PROMETHAZINE (PHENERGAN) 25 MG SUPPOSITORY    Place 1 suppository (25 mg total) rectally every 6 (six) hours as needed for nausea or vomiting.   PROMETHAZINE (PHENERGAN) 25 MG TABLET    Take 1 tablet (25 mg total) by mouth every 6 (six) hours as needed for nausea or vomiting.  Sherwood Gambler, MD 07/31/16 [place], MD 07/31/16 [phone]

## 2016-07-31 NOTE — ED Triage Notes (Signed)
Pt is here with not being able to eat much for one week, not vomiting.  LBM over one week.  Pt feels constipation and states she has history of constipation.  Pt states has vomited one time.  LHD was last nite (Peritoneal dialysis).  PT states discomfort is at top of stomach.

## 2016-07-31 NOTE — ED Notes (Signed)
Called to renal floor to get nursing staff to come draw patient peritoneal samples.

## 2016-07-31 NOTE — ED Notes (Signed)
Pt vomited what appears to look like the orange potassium (approximately the same amount) that she took 15 min ago.  Dr. Regenia Skeeter notified of same.

## 2016-07-31 NOTE — ED Notes (Signed)
Called lab to add magnesium.

## 2016-07-31 NOTE — ED Notes (Signed)
CRITICAL VALUE ALERT  Critical value received:  Potassium 2.7  Date of notification: 07/31/2016  Time of notification:  9842  Critical value read back:Yes.    Nurse who received alert:  MG Boomer Winders,RN  MD notified (1st page):  Dr. Regenia Skeeter  Time of first page:  1348  MD notified (2nd page):  Time of second page:  Responding MD:  [id number]  Time MD responded:  1349

## 2016-08-11 ENCOUNTER — Other Ambulatory Visit (HOSPITAL_COMMUNITY): Payer: Self-pay | Admitting: General Surgery

## 2016-08-11 DIAGNOSIS — R1115 Cyclical vomiting syndrome unrelated to migraine: Secondary | ICD-10-CM

## 2016-08-22 ENCOUNTER — Ambulatory Visit (HOSPITAL_COMMUNITY)
Admission: RE | Admit: 2016-08-22 | Discharge: 2016-08-22 | Disposition: A | Discharge status: Home / Self Care | Payer: 59 | Source: Ambulatory Visit | Attending: General Surgery | Admitting: General Surgery

## 2016-08-22 DIAGNOSIS — Z9884 Bariatric surgery status: Secondary | ICD-10-CM | POA: Diagnosis not present

## 2016-08-22 DIAGNOSIS — K449 Diaphragmatic hernia without obstruction or gangrene: Secondary | ICD-10-CM | POA: Insufficient documentation

## 2016-08-22 DIAGNOSIS — R1110 Vomiting, unspecified: Secondary | ICD-10-CM | POA: Insufficient documentation

## 2016-08-22 DIAGNOSIS — R1115 Cyclical vomiting syndrome unrelated to migraine: Secondary | ICD-10-CM

## 2016-10-07 ENCOUNTER — Emergency Department (HOSPITAL_COMMUNITY): Payer: Medicare HMO

## 2016-10-07 ENCOUNTER — Encounter (HOSPITAL_COMMUNITY): Payer: Self-pay | Admitting: *Deleted

## 2016-10-07 ENCOUNTER — Inpatient Hospital Stay (HOSPITAL_COMMUNITY)
Admission: EM | Admit: 2016-10-07 | Discharge: 2016-10-11 | DRG: 981 | Disposition: A | Discharge status: Home / Self Care | Payer: Medicare HMO | Attending: Internal Medicine | Admitting: Internal Medicine

## 2016-10-07 DIAGNOSIS — N184 Chronic kidney disease, stage 4 (severe): Secondary | ICD-10-CM | POA: Diagnosis not present

## 2016-10-07 DIAGNOSIS — Z885 Allergy status to narcotic agent status: Secondary | ICD-10-CM

## 2016-10-07 DIAGNOSIS — M109 Gout, unspecified: Secondary | ICD-10-CM | POA: Diagnosis present

## 2016-10-07 DIAGNOSIS — Z800 Family history of malignant neoplasm of digestive organs: Secondary | ICD-10-CM

## 2016-10-07 DIAGNOSIS — E1142 Type 2 diabetes mellitus with diabetic polyneuropathy: Secondary | ICD-10-CM | POA: Diagnosis present

## 2016-10-07 DIAGNOSIS — E1129 Type 2 diabetes mellitus with other diabetic kidney complication: Secondary | ICD-10-CM | POA: Diagnosis present

## 2016-10-07 DIAGNOSIS — Z9841 Cataract extraction status, right eye: Secondary | ICD-10-CM

## 2016-10-07 DIAGNOSIS — R000 Tachycardia, unspecified: Secondary | ICD-10-CM | POA: Diagnosis present

## 2016-10-07 DIAGNOSIS — E876 Hypokalemia: Secondary | ICD-10-CM | POA: Diagnosis present

## 2016-10-07 DIAGNOSIS — E1151 Type 2 diabetes mellitus with diabetic peripheral angiopathy without gangrene: Secondary | ICD-10-CM | POA: Diagnosis present

## 2016-10-07 DIAGNOSIS — B9689 Other specified bacterial agents as the cause of diseases classified elsewhere: Secondary | ICD-10-CM | POA: Diagnosis present

## 2016-10-07 DIAGNOSIS — G4730 Sleep apnea, unspecified: Secondary | ICD-10-CM | POA: Diagnosis present

## 2016-10-07 DIAGNOSIS — Y92019 Unspecified place in single-family (private) house as the place of occurrence of the external cause: Secondary | ICD-10-CM | POA: Diagnosis not present

## 2016-10-07 DIAGNOSIS — E785 Hyperlipidemia, unspecified: Secondary | ICD-10-CM | POA: Diagnosis present

## 2016-10-07 DIAGNOSIS — Z6828 Body mass index (BMI) 28.0-28.9, adult: Secondary | ICD-10-CM

## 2016-10-07 DIAGNOSIS — Z79899 Other long term (current) drug therapy: Secondary | ICD-10-CM

## 2016-10-07 DIAGNOSIS — Z961 Presence of intraocular lens: Secondary | ICD-10-CM | POA: Diagnosis present

## 2016-10-07 DIAGNOSIS — K652 Spontaneous bacterial peritonitis: Secondary | ICD-10-CM | POA: Diagnosis not present

## 2016-10-07 DIAGNOSIS — I10 Essential (primary) hypertension: Secondary | ICD-10-CM | POA: Diagnosis present

## 2016-10-07 DIAGNOSIS — E872 Acidosis, unspecified: Secondary | ICD-10-CM

## 2016-10-07 DIAGNOSIS — Z8619 Personal history of other infectious and parasitic diseases: Secondary | ICD-10-CM | POA: Diagnosis not present

## 2016-10-07 DIAGNOSIS — E1122 Type 2 diabetes mellitus with diabetic chronic kidney disease: Secondary | ICD-10-CM

## 2016-10-07 DIAGNOSIS — Z992 Dependence on renal dialysis: Secondary | ICD-10-CM | POA: Diagnosis not present

## 2016-10-07 DIAGNOSIS — I120 Hypertensive chronic kidney disease with stage 5 chronic kidney disease or end stage renal disease: Secondary | ICD-10-CM | POA: Diagnosis not present

## 2016-10-07 DIAGNOSIS — K659 Peritonitis, unspecified: Secondary | ICD-10-CM | POA: Diagnosis present

## 2016-10-07 DIAGNOSIS — E039 Hypothyroidism, unspecified: Secondary | ICD-10-CM | POA: Diagnosis present

## 2016-10-07 DIAGNOSIS — I9589 Other hypotension: Secondary | ICD-10-CM | POA: Diagnosis not present

## 2016-10-07 DIAGNOSIS — Z9884 Bariatric surgery status: Secondary | ICD-10-CM

## 2016-10-07 DIAGNOSIS — Z794 Long term (current) use of insulin: Secondary | ICD-10-CM

## 2016-10-07 DIAGNOSIS — Z833 Family history of diabetes mellitus: Secondary | ICD-10-CM | POA: Diagnosis not present

## 2016-10-07 DIAGNOSIS — R1084 Generalized abdominal pain: Secondary | ICD-10-CM | POA: Diagnosis not present

## 2016-10-07 DIAGNOSIS — J069 Acute upper respiratory infection, unspecified: Secondary | ICD-10-CM | POA: Diagnosis present

## 2016-10-07 DIAGNOSIS — Z940 Kidney transplant status: Secondary | ICD-10-CM | POA: Diagnosis not present

## 2016-10-07 DIAGNOSIS — Z9049 Acquired absence of other specified parts of digestive tract: Secondary | ICD-10-CM | POA: Diagnosis not present

## 2016-10-07 DIAGNOSIS — D649 Anemia, unspecified: Secondary | ICD-10-CM | POA: Diagnosis present

## 2016-10-07 DIAGNOSIS — K219 Gastro-esophageal reflux disease without esophagitis: Secondary | ICD-10-CM | POA: Diagnosis present

## 2016-10-07 DIAGNOSIS — N2581 Secondary hyperparathyroidism of renal origin: Secondary | ICD-10-CM | POA: Diagnosis present

## 2016-10-07 DIAGNOSIS — T8571XA Infection and inflammatory reaction due to peritoneal dialysis catheter, initial encounter: Principal | ICD-10-CM | POA: Diagnosis present

## 2016-10-07 DIAGNOSIS — E43 Unspecified severe protein-calorie malnutrition: Secondary | ICD-10-CM | POA: Diagnosis present

## 2016-10-07 DIAGNOSIS — Z881 Allergy status to other antibiotic agents status: Secondary | ICD-10-CM

## 2016-10-07 DIAGNOSIS — Z9842 Cataract extraction status, left eye: Secondary | ICD-10-CM | POA: Diagnosis not present

## 2016-10-07 DIAGNOSIS — T8612 Kidney transplant failure: Secondary | ICD-10-CM | POA: Diagnosis present

## 2016-10-07 DIAGNOSIS — E1121 Type 2 diabetes mellitus with diabetic nephropathy: Secondary | ICD-10-CM | POA: Diagnosis not present

## 2016-10-07 DIAGNOSIS — N186 End stage renal disease: Secondary | ICD-10-CM | POA: Diagnosis present

## 2016-10-07 DIAGNOSIS — G43909 Migraine, unspecified, not intractable, without status migrainosus: Secondary | ICD-10-CM | POA: Diagnosis present

## 2016-10-07 LAB — INFLUENZA PANEL BY PCR (TYPE A & B)
Influenza A By PCR: NEGATIVE
Influenza B By PCR: NEGATIVE

## 2016-10-07 LAB — COMPREHENSIVE METABOLIC PANEL
ALT: 17 U/L (ref 14–54)
AST: 17 U/L (ref 15–41)
Albumin: 1.4 g/dL — ABNORMAL LOW (ref 3.5–5.0)
Alkaline Phosphatase: 118 U/L (ref 38–126)
Anion gap: 12 (ref 5–15)
BUN: 21 mg/dL — ABNORMAL HIGH (ref 6–20)
CO2: 26 mmol/L (ref 22–32)
Calcium: 8 mg/dL — ABNORMAL LOW (ref 8.9–10.3)
Chloride: 97 mmol/L — ABNORMAL LOW (ref 101–111)
Creatinine, Ser: 8.08 mg/dL — ABNORMAL HIGH (ref 0.44–1.00)
GFR calc Af Amer: 6 mL/min — ABNORMAL LOW (ref >60)
GFR calc non Af Amer: 5 mL/min — ABNORMAL LOW (ref >60)
Glucose, Bld: 267 mg/dL — ABNORMAL HIGH (ref 65–99)
Potassium: 3.3 mmol/L — ABNORMAL LOW (ref 3.5–5.1)
Sodium: 135 mmol/L (ref 135–145)
Total Bilirubin: 0.6 mg/dL (ref 0.3–1.2)
Total Protein: 4.9 g/dL — ABNORMAL LOW (ref 6.5–8.1)

## 2016-10-07 LAB — CBC WITH DIFFERENTIAL/PLATELET
Basophils Absolute: 0 10*3/uL (ref 0.0–0.1)
Basophils Relative: 0 %
Eosinophils Absolute: 0 10*3/uL (ref 0.0–0.7)
Eosinophils Relative: 0 %
HCT: 37.3 % (ref 36.0–46.0)
Hemoglobin: 12.4 g/dL (ref 12.0–15.0)
Lymphocytes Relative: 12 %
Lymphs Abs: 0.8 10*3/uL (ref 0.7–4.0)
MCH: 27.4 pg (ref 26.0–34.0)
MCHC: 33.2 g/dL (ref 30.0–36.0)
MCV: 82.5 fL (ref 78.0–100.0)
Monocytes Absolute: 0.3 10*3/uL (ref 0.1–1.0)
Monocytes Relative: 5 %
Neutro Abs: 5.3 10*3/uL (ref 1.7–7.7)
Neutrophils Relative %: 83 %
Platelets: 237 10*3/uL (ref 150–400)
RBC: 4.52 MIL/uL (ref 3.87–5.11)
RDW: 14.7 % (ref 11.5–15.5)
WBC: 6.4 10*3/uL (ref 4.0–10.5)

## 2016-10-07 LAB — BODY FLUID CELL COUNT WITH DIFFERENTIAL
Eos, Fluid: 0 %
Lymphs, Fluid: 3 %
Monocyte-Macrophage-Serous Fluid: 4 % — ABNORMAL LOW (ref 50–90)
Neutrophil Count, Fluid: 93 % — ABNORMAL HIGH (ref 0–25)
Other Cells, Fluid: 0 %
Total Nucleated Cell Count, Fluid: 9836 cu mm — ABNORMAL HIGH (ref 0–1000)

## 2016-10-07 LAB — I-STAT CG4 LACTIC ACID, ED: Lactic Acid, Venous: 4.55 mmol/L (ref 0.5–1.9)

## 2016-10-07 LAB — GRAM STAIN

## 2016-10-07 LAB — LIPASE, BLOOD: Lipase: <10 U/L — ABNORMAL LOW (ref 11–51)

## 2016-10-07 LAB — LACTIC ACID, PLASMA: Lactic Acid, Venous: 3.6 mmol/L (ref 0.5–1.9)

## 2016-10-07 LAB — GLUCOSE, CAPILLARY: Glucose-Capillary: 166 mg/dL — ABNORMAL HIGH (ref 65–99)

## 2016-10-07 MED ORDER — OXYCODONE-ACETAMINOPHEN 5-325 MG PO TABS: 1.0000 | ORAL_TABLET | Freq: Four times a day (QID) | ORAL | Status: DC | PRN

## 2016-10-07 MED ORDER — SODIUM CHLORIDE 0.9 % IV SOLN: 250.0000 mL | INTRAVENOUS | Status: DC | PRN

## 2016-10-07 MED ORDER — ALLOPURINOL 100 MG PO TABS
100.0000 mg | ORAL_TABLET | Freq: Every day | ORAL | Status: DC
  Administered 2016-10-07 – 2016-10-10 (×4): 100 mg via ORAL
  Filled 2016-10-07 (×4): qty 1

## 2016-10-07 MED ORDER — FENTANYL CITRATE (PF) 100 MCG/2ML IJ SOLN
50.0000 ug | Freq: Once | INTRAMUSCULAR | Status: AC
  Administered 2016-10-07: 50 ug via INTRAVENOUS
  Filled 2016-10-07: qty 2

## 2016-10-07 MED ORDER — OXYCODONE-ACETAMINOPHEN 5-325 MG PO TABS
1.0000 | ORAL_TABLET | ORAL | Status: DC | PRN
  Administered 2016-10-08 – 2016-10-11 (×6): 1 via ORAL
  Filled 2016-10-07 (×6): qty 1

## 2016-10-07 MED ORDER — SODIUM CHLORIDE 0.9% FLUSH
3.0000 mL | Freq: Two times a day (BID) | INTRAVENOUS | Status: DC
  Administered 2016-10-08 – 2016-10-11 (×4): 3 mL via INTRAVENOUS

## 2016-10-07 MED ORDER — PREGABALIN 75 MG PO CAPS
75.0000 mg | ORAL_CAPSULE | Freq: Two times a day (BID) | ORAL | Status: DC
  Administered 2016-10-07 – 2016-10-08 (×2): 75 mg via ORAL
  Filled 2016-10-07 (×2): qty 1

## 2016-10-07 MED ORDER — LEVOTHYROXINE SODIUM 100 MCG PO TABS
100.0000 ug | ORAL_TABLET | Freq: Every day | ORAL | Status: DC
  Administered 2016-10-08 – 2016-10-11 (×3): 100 ug via ORAL
  Filled 2016-10-07 (×3): qty 1

## 2016-10-07 MED ORDER — FENTANYL CITRATE (PF) 100 MCG/2ML IJ SOLN
25.0000 ug | Freq: Once | INTRAMUSCULAR | Status: AC
  Administered 2016-10-07: 25 ug via INTRAVENOUS
  Filled 2016-10-07: qty 2

## 2016-10-07 MED ORDER — INSULIN ASPART 100 UNIT/ML ~~LOC~~ SOLN
10.0000 [IU] | Freq: Three times a day (TID) | SUBCUTANEOUS | Status: DC
  Administered 2016-10-11: 10 [IU] via SUBCUTANEOUS

## 2016-10-07 MED ORDER — SODIUM CHLORIDE 0.9 % IV BOLUS (SEPSIS)
500.0000 mL | Freq: Once | INTRAVENOUS | Status: AC
  Administered 2016-10-07: 500 mL via INTRAVENOUS

## 2016-10-07 MED ORDER — ONDANSETRON HCL 4 MG PO TABS
4.0000 mg | ORAL_TABLET | Freq: Four times a day (QID) | ORAL | Status: DC | PRN
  Administered 2016-10-08 – 2016-10-10 (×2): 4 mg via ORAL
  Filled 2016-10-07 (×2): qty 1

## 2016-10-07 MED ORDER — SODIUM CHLORIDE 0.9% FLUSH: 3.0000 mL | Freq: Two times a day (BID) | INTRAVENOUS | Status: DC

## 2016-10-07 MED ORDER — INSULIN ASPART 100 UNIT/ML ~~LOC~~ SOLN
0.0000 [IU] | SUBCUTANEOUS | Status: DC
  Administered 2016-10-07 – 2016-10-09 (×3): 2 [IU] via SUBCUTANEOUS
  Administered 2016-10-11 (×2): 1 [IU] via SUBCUTANEOUS

## 2016-10-07 MED ORDER — GUAIFENESIN ER 600 MG PO TB12
600.0000 mg | ORAL_TABLET | Freq: Two times a day (BID) | ORAL | Status: DC
  Administered 2016-10-07 – 2016-10-11 (×7): 600 mg via ORAL
  Filled 2016-10-07 (×7): qty 1

## 2016-10-07 MED ORDER — SODIUM CHLORIDE 0.9% FLUSH: 3.0000 mL | INTRAVENOUS | Status: DC | PRN

## 2016-10-07 MED ORDER — SEVELAMER CARBONATE 2.4 G PO PACK
2.4000 g | PACK | Freq: Three times a day (TID) | ORAL | Status: DC
  Administered 2016-10-08 – 2016-10-11 (×7): 2.4 g via ORAL
  Filled 2016-10-07 (×7): qty 1

## 2016-10-07 MED ORDER — ONDANSETRON HCL 4 MG/2ML IJ SOLN
4.0000 mg | Freq: Four times a day (QID) | INTRAMUSCULAR | Status: DC | PRN
  Administered 2016-10-08 – 2016-10-10 (×2): 4 mg via INTRAVENOUS
  Filled 2016-10-07 (×2): qty 2

## 2016-10-07 MED ORDER — SODIUM CHLORIDE 0.9 % IV BOLUS (SEPSIS)
1000.0000 mL | Freq: Once | INTRAVENOUS | Status: AC
  Administered 2016-10-07: 1000 mL via INTRAVENOUS

## 2016-10-07 NOTE — H&P (Addendum)
Wanda [name] WPY:[id number] DOB: [birth year] DOA: 10/07/2016     PCP: Myriam Jacobson, MD   Outpatient Specialists: [facility] kidney transplant team Patient coming from: home Lives   With family   Chief Complaint: Abdominal pain   HPI: Wanda [name] is a 56 y.o. female with medical history significant of chronic kidney disease on peritoneal dialysis, sp gastric bipass    Presented with 24 hours of generalized abdominal pain worsen left upper quadrant. Feels aching severity moderate. She noted some associated diarrhea nausea and vomiting. She have not had any fevers no constipation otherwise denies headaches no chest pains. She was seen on dialysis center today and they will concern for peritonitis she had peritoneal fluid from and was sent out. While in dialysis Center patient was given vancomycin and cefoxitin that was at 1:30 PM today. Patient had a cold and URI symptoms 2 weeks ago some nonproductive cough that seems to be getting better she hasn't had any leg swelling  Patient reports she has chronic tachycardia Regarding pertinent Chronic problems: She has history of chronic kidney disease since 2014. Sp renal transplant in 2013 but failed by 2014. Currently on PD daily.  She had AV fistula placed in no arm in 2014. She used to have HTN but resolved after she lost weight 100lb after bypass surgery.  Her DM is also under good control.   IN ER:  Temp (24hrs), Avg:97.9 F (36.6 C), Min:97.9 F (36.6 C), Max:97.9 F (36.6 C)      97% oxygen saturation Hr 104 BP 129/73  Lactic Acid 4.55 WBC 6.4  K 3.3 Glucose 267 Alb 1.4 Cr 8.08 CXR: negative Peritoneal  fluid  WBC, Fluid 9,836 (H) cu mm   Neutrophil Count, Fluid 93 (H)     Following Medications were ordered in ER: Medications  fentaNYL (SUBLIMAZE) injection 25 mcg (25 mcg Intravenous Given 10/07/16 1725)  sodium chloride 0.9 % bolus 500 mL (500 mLs Intravenous New Bag/Given 10/07/16 1838)    ER provided discuss  case with nephrology who will follow-up patient while hospitalized  Hospitalist was called for admission for Peritonitis in the setting of peritoneal dialysis  Review of Systems:    Pertinent positives include:  abdominal pain, nausea, non-productive cough,  Constitutional:  No weight loss, night sweats, Fevers, chills, fatigue, weight loss  HEENT:  No headaches, Difficulty swallowing,Tooth/dental problems,Sore throat,  No sneezing, itching, ear ache, nasal congestion, post nasal drip,  Cardio-vascular:  No chest pain, Orthopnea, PND, anasarca, dizziness, palpitations.no Bilateral lower extremity swelling  GI:  No heartburn, indigestion vomiting, diarrhea, change in bowel habits, loss of appetite, melena, blood in stool, hematemesis Resp:  no shortness of breath at rest. No dyspnea on exertion, No excess mucus, no productive cough, No  No coughing up of blood.No change in color of mucus.No wheezing. Skin:  no rash or lesions. No jaundice GU:  no dysuria, change in color of urine, no urgency or frequency. No straining to urinate.  No flank pain.  Musculoskeletal:  No joint pain or no joint swelling. No decreased range of motion. No back pain.  Psych:  No change in mood or affect. No depression or anxiety. No memory loss.  Neuro: no localizing neurological complaints, no tingling, no weakness, no double vision, no gait abnormality, no slurred speech, no confusion  As per HPI otherwise 10 point review of systems negative.   Past Medical History: Past Medical History:  Diagnosis Date  . Anemia   . Arthritis    "  hands" (07/24/2013) pt denies  . Chronic kidney disease, stage IV (severe) ([place])    "never went on dialysis" (07/24/2013)  . Diabetes mellitus type II   . Diabetic peripheral neuropathy ([place])   . Dialysis patient [facility])    Monday Wednesday Friday   . Dyspnea on exertion    pt denies  . Foot pain   . GERD (gastroesophageal reflux disease)   . Gout   . History of  chicken pox   . History of measles   . History of mumps   . Hyperlipidemia   . Hypothyroidism   . Migraines    "q 3 months" (07/24/2013)  . Nonspecific abnormal unspecified cardiovascular function study   . Obesity   . PAD (peripheral artery disease) ([place])   . Peripheral neuropathy ([place])   . Peripheral vascular disease ([place])   . Pneumonia 09/2012  . Renal transplant disorder   . Shingles   . Sleep apnea    "getting ready to get tested again cause dr says I have it" (07/24/2013) pt states was tested and was told she did not have sleep apnea / no cpap  . Unspecified essential hypertension    Past Surgical History:  Procedure Laterality Date  . AV FISTULA PLACEMENT Right 2000   "lower arm" (07/24/2013)  . AV FISTULA PLACEMENT, RADIOCEPHALIC Right 1324   "upper arm" (07/24/2013)  . CATARACT EXTRACTION W/ INTRAOCULAR LENS  IMPLANT, BILATERAL Bilateral 2011  . [place]; 1987  . CHOLECYSTECTOMY  2011  . COLONOSCOPY    . ENDOMETRIAL CRYOABLATION  1980's  . INTRAUTERINE DEVICE (IUD) INSERTION  2011  . LAPAROSCOPIC GASTRIC SLEEVE RESECTION N/A 05/26/2015   Procedure: LAPAROSCOPIC GASTRIC SLEEVE RESECTION;  Surgeon: Excell Seltzer, MD;  Location: [facility] ORS;  Service: General;  Laterality: N/A;  . TRANSPLANTATION RENAL  2013   "right" (09/04/2012)  . TUBAL LIGATION  1987  . [place]   "RLE; stripped" (09/04/2012)  . VIDEO BRONCHOSCOPY  09/07/2012   Procedure: VIDEO BRONCHOSCOPY WITHOUT FLUORO;  Surgeon: Chesley Mires, MD;  Location: [facility] ENDOSCOPY;  Service: Cardiopulmonary;  Laterality: Bilateral;     Social History:  Ambulatory  Independently     reports that she has never smoked. She has never used smokeless tobacco. She reports that she does not drink alcohol or use drugs.  Allergies:   Allergies  Allergen Reactions  . Hydrocodone-Acetaminophen Other (See Comments)    Severe headache  . Propoxyphene N-Acetaminophen Other (See Comments)     Severe headache  . Cephalosporins Rash       Family History:   Family History  Problem Relation Age of Onset  . Colon cancer Father     deceased at age 69  . Diabetes Brother   . Neuropathy Neg Hx     Medications: Prior to Admission medications   Medication Sig Start Date End Date Taking? Authorizing Provider  allopurinol (ZYLOPRIM) 100 MG tablet Take 100 mg by mouth at bedtime.    Yes Historical Provider, MD  colchicine 0.6 MG tablet Take 0.6 mg by mouth daily as needed (gout attack).  05/09/15  Yes Historical Provider, MD  ergocalciferol (VITAMIN D2) 50000 UNITS capsule Take 50,000 Units by mouth daily.    Yes Historical Provider, MD  hydrOXYzine (ATARAX/VISTARIL) 25 MG tablet Take 25 mg by mouth 4 (four) times daily as needed for itching.  07/13/16  Yes Historical Provider, MD  ibuprofen (ADVIL) 200 MG tablet Take 400 mg by mouth every 6 (six)  hours as needed for headache (pain).   Yes Historical Provider, MD  insulin aspart (NOVOLOG FLEXPEN) 100 UNIT/ML FlexPen Inject 10 Units into the skin 3 (three) times daily with meals. 05/28/15  Yes Excell Seltzer, MD  levothyroxine (SYNTHROID, LEVOTHROID) 100 MCG tablet Take 100 mcg by mouth daily. 08/03/15  Yes Historical Provider, MD  Multiple Vitamin (MULTIVITAMIN WITH MINERALS) TABS tablet Take 1 tablet by mouth daily.   Yes Historical Provider, MD  potassium chloride SA (K-DUR,KLOR-CON) 20 MEQ tablet Take 40 mEq by mouth 2 (two) times daily.   Yes Historical Provider, MD  pregabalin (LYRICA) 75 MG capsule Take 75 mg by mouth 2 (two) times daily.  11/03/12  Yes Historical Provider, MD  sevelamer carbonate (RENVELA) 2.4 g PACK Take 2.4 g by mouth See admin instructions. Sprinkle 1 packet (2.4 g) with every meal and with snacks   Yes Historical Provider, MD  ondansetron (ZOFRAN ODT) 4 MG disintegrating tablet Take 1 tablet (4 mg total) by mouth every 8 (eight) hours as needed for nausea or vomiting. Patient not taking: Reported on 10/07/2016  07/31/16   Sherwood Gambler, MD  oxyCODONE-acetaminophen (PERCOCET/ROXICET) 5-325 MG tablet Take 1-2 tablets by mouth every 6 (six) hours as needed for severe pain. Patient not taking: Reported on 10/07/2016 07/05/16   Montine Circle, PA-C  promethazine (PHENERGAN) 25 MG suppository Place 1 suppository (25 mg total) rectally every 6 (six) hours as needed for nausea or vomiting. Patient not taking: Reported on 10/07/2016 07/31/16   Sherwood Gambler, MD  promethazine (PHENERGAN) 25 MG tablet Take 1 tablet (25 mg total) by mouth every 6 (six) hours as needed for nausea or vomiting. Patient not taking: Reported on 10/07/2016 07/31/16   Sherwood Gambler, MD    Physical Exam: Patient Vitals for the past 24 hrs:  BP Temp Temp src Pulse Resp SpO2 Height Weight  10/07/16 1900 129/73 - - 104 - 97 % - -  10/07/16 1845 120/78 - - 114 - 100 % - -  10/07/16 1745 121/75 - - 102 - 100 % - -  10/07/16 1700 120/77 - - 102 - 99 % - -  10/07/16 1600 123/77 - - - - - - -  10/07/16 1510 - - - - - - 5\' 9"  (1.753 m) 89.8 kg (198 lb)  10/07/16 1509 98/68 97.9 F (36.6 C) Oral 114 20 98 % - -    1. General:  in No Acute distress 2. Psychological: Alert and  Oriented 3. Head/ENT:   Moist   Mucous Membranes                          Head Non traumatic, neck supple                          Normal  Dentition 4. SKIN: normal  Skin turgor,  Skin clean Dry and intact no rash 5. Heart: Regular rate and rhythm no  Murmur, Rub or gallop 6. Lungs:  Clear to auscultation bilaterally, no wheezes or crackles   7. Abdomen: Soft, somewhat tender, Non distended 8. Lower extremities: no clubbing, cyanosis, or edema 9. Neurologically Grossly intact, moving all 4 extremities equally  10. MSK: Normal range of motion   body mass index is 29.24 kg/m.  Labs on Admission:   Labs on Admission: I have personally reviewed following labs and imaging studies  CBC:  Recent Labs Lab 10/07/16 1718  WBC 6.4  NEUTROABS 5.3  HGB 12.4  HCT  37.3  MCV 82.5  PLT 786   Basic Metabolic Panel:  Recent Labs Lab 10/07/16 1718  NA 135  K 3.3*  CL 97*  CO2 26  GLUCOSE 267*  BUN 21*  CREATININE 8.08*  CALCIUM 8.0*   GFR: Estimated Creatinine Clearance: 9.3 mL/min (by C-G formula based on SCr of 8.08 mg/dL (H)). Liver Function Tests:  Recent Labs Lab 10/07/16 1718  AST 17  ALT 17  ALKPHOS 118  BILITOT 0.6  PROT 4.9*  ALBUMIN 1.4*    Recent Labs Lab 10/07/16 1718  LIPASE <10*   No results for input(s): AMMONIA in the last 168 hours. Coagulation Profile: No results for input(s): INR, PROTIME in the last 168 hours. Cardiac Enzymes: No results for input(s): CKTOTAL, CKMB, CKMBINDEX, TROPONINI in the last 168 hours. BNP (last 3 results) No results for input(s): PROBNP in the last 8760 hours. HbA1C: No results for input(s): HGBA1C in the last 72 hours. CBG: No results for input(s): GLUCAP in the last 168 hours. Lipid Profile: No results for input(s): CHOL, HDL, LDLCALC, TRIG, CHOLHDL, LDLDIRECT in the last 72 hours. Thyroid Function Tests: No results for input(s): TSH, T4TOTAL, FREET4, T3FREE, THYROIDAB in the last 72 hours. Anemia Panel: No results for input(s): VITAMINB12, FOLATE, FERRITIN, TIBC, IRON, RETICCTPCT in the last 72 hours. Urine analysis:    Component Value Date/Time   COLORURINE YELLOW 12/22/2013 1950   APPEARANCEUR CLEAR 12/22/2013 1950   LABSPEC 1.020 12/22/2013 1950   PHURINE 7.5 12/22/2013 1950   GLUCOSEU >1000 (A) 12/22/2013 1950   HGBUR TRACE (A) 12/22/2013 1950   BILIRUBINUR NEGATIVE 12/22/2013 1950   [place] NEGATIVE 12/22/2013 1950   PROTEINUR >300 (A) 12/22/2013 1950   UROBILINOGEN 0.2 12/22/2013 1950   NITRITE NEGATIVE 12/22/2013 1950   LEUKOCYTESUR NEGATIVE 12/22/2013 1950   Sepsis Labs: @LABRCNTIP (procalcitonin:4,lacticidven:4) ) Recent Results (from the past 240 hour(s))  Gram stain     Status: None   Collection Time: 10/07/16  4:21 PM  Result Value Ref Range  Status   Specimen Description FLUID PERITONEAL DIALYSATE  Final   Special Requests NONE  Final   Gram Stain   Final    ABUNDANT WBC PRESENT, PREDOMINANTLY PMN NO ORGANISMS SEEN    Report Status 10/07/2016 FINAL  Final  Blood culture (routine x 2)     Status: None (Preliminary result)   Collection Time: 10/07/16  5:20 PM  Result Value Ref Range Status   Specimen Description BLOOD LEFT HAND  Final   Special Requests IN PEDIATRIC BOTTLE 4CC  Final   Culture PENDING  Incomplete   Report Status PENDING  Incomplete  Blood culture (routine x 2)     Status: None (Preliminary result)   Collection Time: 10/07/16  5:25 PM  Result Value Ref Range Status   Specimen Description BLOOD LEFT ANTECUBITAL  Final   Special Requests   Final    BOTTLES DRAWN AEROBIC AND ANAEROBIC BLUE 10CC RED 5CC   Culture PENDING  Incomplete   Report Status PENDING  Incomplete      UA not ordered  Lab Results  Component Value Date   HGBA1C 7.6 (H) 07/10/2014    Estimated Creatinine Clearance: 9.3 mL/min (by C-G formula based on SCr of 8.08 mg/dL (H)).  BNP (last 3 results) No results for input(s): PROBNP in the last 8760 hours.   ECG REPORT Not ordered  [facility] Weights   10/07/16 1510  Weight: 89.8 kg (198 lb)  Cultures:    Component Value Date/Time   SDES BLOOD LEFT ANTECUBITAL 10/07/2016 1725   SPECREQUEST  10/07/2016 1725    BOTTLES DRAWN AEROBIC AND ANAEROBIC BLUE 10CC RED 5CC   CULT PENDING 10/07/2016 1725   REPTSTATUS PENDING 10/07/2016 1725     Radiological Exams on Admission: Dg Chest 2 View  Result Date: 10/07/2016 CLINICAL DATA:  Cough x2 weeks EXAM: CHEST  2 VIEW COMPARISON:  08/20/2014 FINDINGS: The heart size and mediastinal contours are within normal limits. Both lungs are clear. The visualized skeletal structures are unremarkable. IMPRESSION: No active cardiopulmonary disease. Electronically Signed   By: Ashley Royalty M.D.   On: 10/07/2016 18:16    Chart has been  reviewed    Assessment/Plan  55 y.o. female with medical history significant of chronic kidney disease on peritoneal dialysis admitted for Peritonitis in the setting of peritoneal dialysis     Present on Admission:  . SBP (spontaneous bacterial peritonitis) ([place]) - continue antibiotics as per nephrology with dialysis . DM (diabetes mellitus), type 2 with renal complications (HCC)Order sliding scale continue home dose of insulin . CHRONIC KIDNEY DISEASE STAGE IV ([place]) -discuss case with neurology patient had shown interest to switch from peritoneal dialysis to HD. Nephrology will see patient in the morning. Recommends not to dialyze tonight and allow antibiotics to remain  peritoneally currently no evidence of fluid overload electrolytes stable. No need for emergent dialysis.   . Hypokalemia - as per nephrology . HTN (hypertension) improved status post weight loss. Actually not on any antihypertensives at home. Will continue to monitor Elevated lactic acid - continue to cycle and monitor patient does not appear to be toxic. No fever white blood cell count to suggest sepsis Tachycardia this is chronic per patient Will check TSH level. Cough nausea vomiting urine symptoms. We will test for influenza chest x-ray so far unremarkable Other plan as per orders.  DVT prophylaxis:   scd in case se may need any procedures  Code Status:  FULL CODE  as per patient    Family Communication:   Family at  Bedside  plan of care was discussed with  Husband,   Disposition Plan:     To home once workup is complete and patient is stable                          Consults called: Nephrology   Admission status:   inpatient  she will likely stay for longer than 2 midnights given severity of illness requirement for IV antibiotics and hemodialysis   Level of care        medical floor         I have spent a total of 56 min on this admission   Wilmot Quevedo 10/07/2016, 8:24 PM    [facility]   Pager [phone]   after 2 AM please page floor coverage PA If 7AM-7PM, please contact the day team taking care of the patient  [email or web address]  Password [email or web address]

## 2016-10-07 NOTE — ED Notes (Signed)
Hospitalist MD at bedside. 

## 2016-10-07 NOTE — ED Notes (Signed)
Pt transported to xray 

## 2016-10-07 NOTE — ED Notes (Signed)
Attempted to call report

## 2016-10-07 NOTE — ED Provider Notes (Signed)
[place] DEPT Provider Note   CSN: [id number] Arrival date & time: 10/07/16  1414   History   Chief Complaint Chief Complaint  Patient presents with  . Abdominal Pain    HPI Wanda [name] is a 56 y.o. female.  The history is provided by the patient.  Abdominal Pain   This is a new problem. The current episode started yesterday. The problem occurs constantly. The problem has been gradually worsening. The pain is located in the generalized abdominal region, epigastric region and LUQ. The quality of the pain is aching. The pain is moderate. Associated symptoms include diarrhea (loose stools), nausea and vomiting. Pertinent negatives include fever, melena, constipation, dysuria and headaches. Her past medical history is significant for GERD.  -Patient was sent from dialysis center for concerns of peritonitis. Patient is a peritoneal dialysis patient and started having abdominal pain yesterday morning. Patient had peritoneal fluid drawn at the dialysis center and this was sent with the patient. Patient also given vancomycin and cefoxitin around 1:30 PM today. Patient denies any fevers or chills at home. Patient also endorses having a cold over the last 2 weeks and has had a nonproductive cough during that time as well. Patient denies any shortness of breath or any leg swelling.  Past Medical History:  Diagnosis Date  . Anemia   . Arthritis    "hands" (07/24/2013) pt denies  . Chronic kidney disease, stage IV (severe) ([place])    "never went on dialysis" (07/24/2013)  . Diabetes mellitus type II   . Diabetic peripheral neuropathy ([place])   . Dialysis patient [facility])    Monday Wednesday Friday   . Dyspnea on exertion    pt denies  . Foot pain   . GERD (gastroesophageal reflux disease)   . Gout   . History of chicken pox   . History of measles   . History of mumps   . Hyperlipidemia   . Hypothyroidism   . Migraines    "q 3 months" (07/24/2013)  . Nonspecific abnormal unspecified  cardiovascular function study   . Obesity   . PAD (peripheral artery disease) ([place])   . Peripheral neuropathy ([place])   . Peripheral vascular disease ([place])   . Pneumonia 09/2012  . Renal transplant disorder   . Shingles   . Sleep apnea    "getting ready to get tested again cause dr says I have it" (07/24/2013) pt states was tested and was told she did not have sleep apnea / no cpap  . Unspecified essential hypertension     Patient Active Problem List   Diagnosis Date Noted  . Morbid obesity ([place]) 05/26/2015  . Hypoglycemia 07/24/2013  . Nausea and vomiting 01/08/2013  . Fever 01/08/2013  . Renal failure (ARF), acute on chronic (HCC) 01/08/2013  . Renal transplant disorder 09/04/2012  . Hyponatremia 09/04/2012  . CKD (chronic kidney disease) 09/04/2012  . HTN (hypertension) 09/04/2012  . Healthcare-associated pneumonia 09/04/2012  . Anemia 09/04/2012  . Cough 03/22/2011  . SHINGLES 12/21/2009  . HYPOTHYROIDISM 12/21/2009  . DIABETES MELLITUS, TYPE II 12/21/2009  . HYPERLIPIDEMIA 12/21/2009  . OBESITY 12/21/2009  . DYSPNEA 12/21/2009  . MIGRAINES, HX OF 12/21/2009  . HYPERTENSION, UNSPECIFIED 11/20/2009  . CHRONIC KIDNEY DISEASE STAGE IV (SEVERE) 11/20/2009  . DYSPNEA ON EXERTION 11/20/2009  . NONSPECIFIC ABNORMAL UNSPEC CV FUNCTION STUDY 11/20/2009    Past Surgical History:  Procedure Laterality Date  . AV FISTULA PLACEMENT Right 2000   "lower arm" (07/24/2013)  . AV FISTULA  PLACEMENT, RADIOCEPHALIC Right 9518   "upper arm" (07/24/2013)  . CATARACT EXTRACTION W/ INTRAOCULAR LENS  IMPLANT, BILATERAL Bilateral 2011  . [place]; 1987  . CHOLECYSTECTOMY  2011  . COLONOSCOPY    . ENDOMETRIAL CRYOABLATION  1980's  . INTRAUTERINE DEVICE (IUD) INSERTION  2011  . LAPAROSCOPIC GASTRIC SLEEVE RESECTION N/A 05/26/2015   Procedure: LAPAROSCOPIC GASTRIC SLEEVE RESECTION;  Surgeon: Excell Seltzer, MD;  Location: [facility] ORS;  Service: General;  Laterality: N/A;  .  TRANSPLANTATION RENAL  2013   "right" (09/04/2012)  . TUBAL LIGATION  1987  . [place]   "RLE; stripped" (09/04/2012)  . VIDEO BRONCHOSCOPY  09/07/2012   Procedure: VIDEO BRONCHOSCOPY WITHOUT FLUORO;  Surgeon: Chesley Mires, MD;  Location[facility] ENDOSCOPY;  Service: Cardiopulmonary;  Laterality: Bilateral;    OB History    No data available       Home Medications    Prior to Admission medications   Medication Sig Start Date End Date Taking? Authorizing Provider  allopurinol (ZYLOPRIM) 100 MG tablet Take 100 mg by mouth at bedtime.    Yes Historical Provider, MD  colchicine 0.6 MG tablet Take 0.6 mg by mouth daily as needed (gout attack).  05/09/15  Yes Historical Provider, MD  ergocalciferol (VITAMIN D2) 50000 UNITS capsule Take 50,000 Units by mouth daily.    Yes Historical Provider, MD  hydrOXYzine (ATARAX/VISTARIL) 25 MG tablet Take 25 mg by mouth 4 (four) times daily as needed for itching.  07/13/16  Yes Historical Provider, MD  ibuprofen (ADVIL) 200 MG tablet Take 400 mg by mouth every 6 (six) hours as needed for headache (pain).   Yes Historical Provider, MD  insulin aspart (NOVOLOG FLEXPEN) 100 UNIT/ML FlexPen Inject 10 Units into the skin 3 (three) times daily with meals. 05/28/15  Yes Excell Seltzer, MD  levothyroxine (SYNTHROID, LEVOTHROID) 100 MCG tablet Take 100 mcg by mouth daily. 08/03/15  Yes Historical Provider, MD  Multiple Vitamin (MULTIVITAMIN WITH MINERALS) TABS tablet Take 1 tablet by mouth daily.   Yes Historical Provider, MD  potassium chloride SA (K-DUR,KLOR-CON) 20 MEQ tablet Take 40 mEq by mouth 2 (two) times daily.   Yes Historical Provider, MD  pregabalin (LYRICA) 75 MG capsule Take 75 mg by mouth 2 (two) times daily.  11/03/12  Yes Historical Provider, MD  sevelamer carbonate (RENVELA) 2.4 g PACK Take 2.4 g by mouth See admin instructions. Sprinkle 1 packet (2.4 g) with every meal and with snacks   Yes Historical Provider, MD  ondansetron (ZOFRAN  ODT) 4 MG disintegrating tablet Take 1 tablet (4 mg total) by mouth every 8 (eight) hours as needed for nausea or vomiting. Patient not taking: Reported on 10/07/2016 07/31/16   Sherwood Gambler, MD  oxyCODONE-acetaminophen (PERCOCET/ROXICET) 5-325 MG tablet Take 1-2 tablets by mouth every 6 (six) hours as needed for severe pain. Patient not taking: Reported on 10/07/2016 07/05/16   Montine Circle, PA-C  promethazine (PHENERGAN) 25 MG suppository Place 1 suppository (25 mg total) rectally every 6 (six) hours as needed for nausea or vomiting. Patient not taking: Reported on 10/07/2016 07/31/16   Sherwood Gambler, MD  promethazine (PHENERGAN) 25 MG tablet Take 1 tablet (25 mg total) by mouth every 6 (six) hours as needed for nausea or vomiting. Patient not taking: Reported on 10/07/2016 07/31/16   Sherwood Gambler, MD    Family History Family History  Problem Relation Age of Onset  . Colon cancer Father     deceased at age 43  .  Diabetes Brother   . Neuropathy Neg Hx     Social History Social History  Substance Use Topics  . Smoking status: Never Smoker  . Smokeless tobacco: Never Used  . Alcohol use No     Allergies   Hydrocodone-acetaminophen; Propoxyphene n-acetaminophen; and Cephalosporins   Review of Systems Review of Systems  Constitutional: Positive for appetite change (dec over last 45mo). Negative for chills and fever.  HENT: Positive for congestion and rhinorrhea.   Respiratory: Positive for cough. Negative for shortness of breath.   Cardiovascular: Negative for chest pain and leg swelling.  Gastrointestinal: Positive for abdominal pain, diarrhea (loose stools), nausea and vomiting. Negative for blood in stool, constipation and melena.  Genitourinary: Negative for dysuria.  Neurological: Negative for light-headedness and headaches.  All other systems reviewed and are negative.    Physical Exam Updated Vital Signs BP 121/75   Pulse 102   Temp 97.9 F (36.6 C) (Oral)   Resp  20   Ht 5\' 9"  (1.753 m)   Wt 89.8 kg   SpO2 100%   BMI 29.24 kg/m   Physical Exam  Constitutional: She appears well-developed and well-nourished. No distress.  HENT:  Head: Normocephalic and atraumatic.  Eyes: Conjunctivae are normal.  Neck: Neck supple.  Cardiovascular: Regular rhythm.  Tachycardia present.   No murmur heard. Pulmonary/Chest: Effort normal and breath sounds normal. No respiratory distress. She has no wheezes. She has no rales.  Abdominal: Soft. There is generalized tenderness (worst in epigastrium) and tenderness in the epigastric area and left upper quadrant. There is guarding (very mild). There is no rigidity and no rebound.  Musculoskeletal: She exhibits no edema.  Neurological: She is alert.  Skin: Skin is warm and dry. She is not diaphoretic.  Psychiatric: She has a normal mood and affect.  Nursing note and vitals reviewed.    ED Treatments / Results  Labs (all labs ordered are listed, but only abnormal results are displayed) Labs Reviewed  COMPREHENSIVE METABOLIC PANEL - Abnormal; Notable for the following:       Result Value   Potassium 3.3 (*)    Chloride 97 (*)    Glucose, Bld 267 (*)    BUN 21 (*)    Creatinine, Ser 8.08 (*)    Calcium 8.0 (*)    Total Protein 4.9 (*)    Albumin 1.4 (*)    GFR calc non Af Amer 5 (*)    GFR calc Af Amer 6 (*)    All other components within normal limits  LIPASE, BLOOD - Abnormal; Notable for the following:    Lipase <10 (*)    All other components within normal limits  I-STAT CG4 LACTIC ACID, ED - Abnormal; Notable for the following:    Lactic Acid, Venous 4.55 (*)    All other components within normal limits  CULTURE, BLOOD (ROUTINE X 2)  CULTURE, BLOOD (ROUTINE X 2)  GRAM STAIN  CULTURE, BODY FLUID-BOTTLE  CBC WITH DIFFERENTIAL/PLATELET  BODY FLUID CELL COUNT WITH DIFFERENTIAL    EKG  EKG Interpretation None       Radiology No results found.  Procedures Procedures (including critical care  time)  Medications Ordered in ED Medications  sodium chloride 0.9 % bolus 500 mL (not administered)  fentaNYL (SUBLIMAZE) injection 25 mcg (25 mcg Intravenous Given 10/07/16 1725)     Initial Impression / Assessment and Plan / ED Course  I have reviewed the triage vital signs and the nursing notes.  Pertinent labs &  imaging results that were available during my care of the patient were reviewed by me and considered in my medical decision making (see chart for details).  Clinical Course    Patient is a 56 year old female with history of ESRD on peritoneal dialysis, GERD, diabetes, hyperlipidemia, history of renal transplant no longer on immunosuppressants who presents with 2 days of worsening abdominal pain. Patient was sent by the dialysis center for concern of peritonitis. Patient denies any fevers or chills at home. Pain is diffuse however worse in the epigastrium and left side of the abdomen. Patient has also had 2 episodes of vomiting today but denies any current nausea. Just complains of cold like symptoms over the last 2 weeks and has had a continuous cough is nonproductive during this time. Patient denies any shortness of breath or leg swelling. Next  On exam patient has mild diffuse guarding but otherwise soft and no obvious peritonitis signs. Patient has increased tenderness in the epigastrium and left upper quadrant. Patient was sent with peritoneal fluid sample. We will run body fluid culture, cell count and differential, Gram stain on this fluid. We will obtain blood cultures and lactic acid as well. CBC, CMP, lipase ordered as well.  Differential includes pancreatitis, GI ulcer, peritonitis.  Patient received the vancomycin and cefoxitin intra-peritoneal prior to arrival at around 1:30 PM today.  I spoke with nephrology who recommends admission to hospitalist and they will follow along with her care. So returned at 4.5. Patient given gentle IV fluids. Gram stain shows numerous WBCs  mostly PMNs. Awaiting cell count.   Pt seen with attending Dr. Thomasene Lot.      Final Clinical Impressions(s) / ED Diagnoses   Final diagnoses:  SBP (spontaneous bacterial peritonitis) ([place])  Lactic acidosis  Hypokalemia    New Prescriptions New Prescriptions   No medications on file     Tobie Poet, DO 10/07/16 Harriman Mackuen, MD 10/07/16 2306

## 2016-10-07 NOTE — ED Notes (Signed)
Attempted IV x 1.  Pt has hx of requiring IV team.

## 2016-10-07 NOTE — ED Notes (Signed)
IV team unable to collect blood.  Will make phlebotomy aware.

## 2016-10-08 DIAGNOSIS — E1121 Type 2 diabetes mellitus with diabetic nephropathy: Secondary | ICD-10-CM

## 2016-10-08 LAB — COMPREHENSIVE METABOLIC PANEL
ALT: 12 U/L — ABNORMAL LOW (ref 14–54)
AST: 15 U/L (ref 15–41)
Albumin: 1.1 g/dL — ABNORMAL LOW (ref 3.5–5.0)
Alkaline Phosphatase: 88 U/L (ref 38–126)
Anion gap: 9 (ref 5–15)
BUN: 22 mg/dL — ABNORMAL HIGH (ref 6–20)
CO2: 26 mmol/L (ref 22–32)
Calcium: 7.3 mg/dL — ABNORMAL LOW (ref 8.9–10.3)
Chloride: 103 mmol/L (ref 101–111)
Creatinine, Ser: 7.78 mg/dL — ABNORMAL HIGH (ref 0.44–1.00)
GFR calc Af Amer: 6 mL/min — ABNORMAL LOW (ref >60)
GFR calc non Af Amer: 5 mL/min — ABNORMAL LOW (ref >60)
Glucose, Bld: 83 mg/dL (ref 65–99)
Potassium: 3.1 mmol/L — ABNORMAL LOW (ref 3.5–5.1)
Sodium: 138 mmol/L (ref 135–145)
Total Bilirubin: 0.3 mg/dL (ref 0.3–1.2)
Total Protein: 3.8 g/dL — ABNORMAL LOW (ref 6.5–8.1)

## 2016-10-08 LAB — CBC
HCT: 31.4 % — ABNORMAL LOW (ref 36.0–46.0)
Hemoglobin: 10.2 g/dL — ABNORMAL LOW (ref 12.0–15.0)
MCH: 27.1 pg (ref 26.0–34.0)
MCHC: 32.5 g/dL (ref 30.0–36.0)
MCV: 83.3 fL (ref 78.0–100.0)
Platelets: 206 10*3/uL (ref 150–400)
RBC: 3.77 MIL/uL — ABNORMAL LOW (ref 3.87–5.11)
RDW: 15 % (ref 11.5–15.5)
WBC: 8.4 10*3/uL (ref 4.0–10.5)

## 2016-10-08 LAB — GLUCOSE, CAPILLARY
Glucose-Capillary: 105 mg/dL — ABNORMAL HIGH (ref 65–99)
Glucose-Capillary: 109 mg/dL — ABNORMAL HIGH (ref 65–99)
Glucose-Capillary: 151 mg/dL — ABNORMAL HIGH (ref 65–99)
Glucose-Capillary: 66 mg/dL (ref 65–99)
Glucose-Capillary: 67 mg/dL (ref 65–99)
Glucose-Capillary: 91 mg/dL (ref 65–99)
Glucose-Capillary: 96 mg/dL (ref 65–99)

## 2016-10-08 LAB — TSH: TSH: 2.904 u[IU]/mL (ref 0.350–4.500)

## 2016-10-08 LAB — PHOSPHORUS: Phosphorus: 2.9 mg/dL (ref 2.5–4.6)

## 2016-10-08 LAB — VANCOMYCIN, RANDOM: Vancomycin Rm: 24

## 2016-10-08 LAB — LACTIC ACID, PLASMA: Lactic Acid, Venous: 2.8 mmol/L (ref 0.5–1.9)

## 2016-10-08 LAB — MAGNESIUM: Magnesium: 1.4 mg/dL — ABNORMAL LOW (ref 1.7–2.4)

## 2016-10-08 MED ORDER — ACETAMINOPHEN 325 MG PO TABS
650.0000 mg | ORAL_TABLET | Freq: Four times a day (QID) | ORAL | Status: DC | PRN
  Administered 2016-10-08 – 2016-10-11 (×2): 650 mg via ORAL
  Filled 2016-10-08 (×2): qty 2

## 2016-10-08 MED ORDER — ENSURE ENLIVE PO LIQD
237.0000 mL | Freq: Two times a day (BID) | ORAL | Status: DC
  Administered 2016-10-09 (×2): 237 mL via ORAL

## 2016-10-08 MED ORDER — PREGABALIN 75 MG PO CAPS
75.0000 mg | ORAL_CAPSULE | Freq: Every day | ORAL | Status: DC
  Administered 2016-10-09: 75 mg via ORAL
  Filled 2016-10-08: qty 1

## 2016-10-08 MED ORDER — HEPARIN SODIUM (PORCINE) 1000 UNIT/ML DIALYSIS: 20.0000 [IU]/kg | INTRAMUSCULAR | Status: DC | PRN

## 2016-10-08 MED ORDER — VANCOMYCIN HCL IN DEXTROSE 1-5 GM/200ML-% IV SOLN
1000.0000 mg | Freq: Once | INTRAVENOUS | Status: AC
  Administered 2016-10-08: 1000 mg via INTRAVENOUS
  Filled 2016-10-08: qty 200

## 2016-10-08 MED ORDER — HEPARIN SODIUM (PORCINE) 5000 UNIT/ML IJ SOLN
5000.0000 [IU] | Freq: Three times a day (TID) | INTRAMUSCULAR | Status: DC
  Administered 2016-10-08 – 2016-10-11 (×8): 5000 [IU] via SUBCUTANEOUS
  Filled 2016-10-08 (×9): qty 1

## 2016-10-08 MED ORDER — SODIUM CHLORIDE 0.9 % IV BOLUS (SEPSIS)
1000.0000 mL | Freq: Once | INTRAVENOUS | Status: AC
  Administered 2016-10-08: 1000 mL via INTRAVENOUS

## 2016-10-08 MED ORDER — POTASSIUM CHLORIDE CRYS ER 20 MEQ PO TBCR
40.0000 meq | EXTENDED_RELEASE_TABLET | Freq: Once | ORAL | Status: AC
  Administered 2016-10-08: 40 meq via ORAL
  Filled 2016-10-08: qty 2

## 2016-10-08 MED ORDER — SODIUM CHLORIDE 0.9 % IV SOLN: 100.0000 mL | INTRAVENOUS | Status: DC | PRN

## 2016-10-08 MED ORDER — DEXTROSE 5 % IV SOLN
1.0000 g | INTRAVENOUS | Status: DC
  Administered 2016-10-08 – 2016-10-09 (×2): 1 g via INTRAVENOUS
  Filled 2016-10-08 (×2): qty 1

## 2016-10-08 MED ORDER — LIDOCAINE-PRILOCAINE 2.5-2.5 % EX CREA: 1.0000 "application " | TOPICAL_CREAM | CUTANEOUS | Status: DC | PRN

## 2016-10-08 MED ORDER — PENTAFLUOROPROP-TETRAFLUOROETH EX AERO: 1.0000 "application " | INHALATION_SPRAY | CUTANEOUS | Status: DC | PRN

## 2016-10-08 MED ORDER — LIDOCAINE HCL (PF) 1 % IJ SOLN: 5.0000 mL | INTRAMUSCULAR | Status: DC | PRN

## 2016-10-08 MED ORDER — HEPARIN SODIUM (PORCINE) 1000 UNIT/ML DIALYSIS: 1000.0000 [IU] | INTRAMUSCULAR | Status: DC | PRN

## 2016-10-08 MED ORDER — ALTEPLASE 2 MG IJ SOLR: 2.0000 mg | Freq: Once | INTRAMUSCULAR | Status: DC | PRN

## 2016-10-08 NOTE — Progress Notes (Signed)
Initial Nutrition Assessment  DOCUMENTATION CODES:   Not applicable  INTERVENTION:  -Pt does not like Nepro, drinks Ensure at home. Electrolytes acceptable at present, recommend Ensure Enlive po BID, each supplement provides 350 kcal and 20 grams of protein -Recommend addition of double portion of protein at meal times, pt also receiving bedtime snack -Pt verbalizes she understands dialysis diet, no education provided at this time except reinforcement of adequate calories/protein and good food sources of protein -Recommend addition of renally appropriate, bariatric MVI   NUTRITION DIAGNOSIS:   Increased nutrient needs related to chronic illness as evidenced by estimated needs.  GOAL:   Patient will meet greater than or equal to 90% of their needs  MONITOR:   PO intake, Supplement acceptance, Labs, Weight trends  REASON FOR ASSESSMENT:   Consult Assessment of nutrition requirement/status  ASSESSMENT:    56 yo female admitted with abdominal pain with dx of PD catheter-induced peritonitis being treated with antibiotic therapy and plan to remove PD catheter and transition to intermittent HD. Pt with hx of ESRD on PD, gastric sleeve (05/2015), DM, GERD, renal transplant disorder (transplant 09/2012)   Pt c/o abdominal pain, nausea, vomited during dialysis today. Pt tolerated some breakfast; ate eggs, 1/2 bagel, bites of peaches and milk this AM. Pt reports appetite has been poor but recently eating 3 meals per day (likes pintos with cheese, baked chicken and green beans, etc).  Pt drinks 2 Ensure per day; pt has tried Nepro but does not like Phosphorus and potassium well controlled as outpatient on PD Pt receivedHD today (4K+bath) with 1L UF via AV fistula Pt is anuric  Pt with significant wt loss since gastric sleeve surgery (05/2015); at the time of surgery, pt weighed 320 pounds. Current wt 194 pounds 14 ounces. Pt reports when she began PD April 2017, pt weighed 208 pounds. 6.7 %  wt loss.   Pt reports she takes MVI at home  Nutrition-Focused physical exam completed. Findings are WDL for fat depletion, muscle depletion, and edema.   Labs: potassium 3.1 (supplemented), magnesium 1.4, corrected calcium 9.62 (albumin 1.1), phosphorus wdl, FSBS 66-166  Meds: ss novolog, novolog 10 units TID with meals, renvela with meals  Diet Order:  Diet renal/carb modified with fluid restriction Diet-HS Snack? Nothing; Room service appropriate? Yes; Fluid consistency: Thin  Skin:  Reviewed, no issues  Last BM:  10/07/16  Height:   Ht Readings from Last 1 Encounters:  10/07/16 5\' 9"  (1.753 m)    Weight:   Wt Readings from Last 1 Encounters:  10/08/16 194 lb 14.2 oz (88.4 kg)    Filed Weights   10/07/16 2119 10/08/16 1050 10/08/16 1500  Weight: 192 lb 12.8 oz (87.5 kg) 197 lb 8.5 oz (89.6 kg) 194 lb 14.2 oz (88.4 kg)    BMI:  Body mass index is 28.78 kg/m.  Estimated Nutritional Needs:   Kcal:  2000-2300 kcals  Protein:  100-115 g of protein  Fluid:  1000 mL plus UOP  EDUCATION NEEDS:   Education needs addressed  Kerman Passey MS, [place], LDN [phone] Pager  [phone] Weekend/On-Call Pager

## 2016-10-08 NOTE — Progress Notes (Signed)
Patient's recheck of BP was 94/52, HR 111.  Patient states that this is her normal baseline blood pressure and has been her baseline for approximately 1 year.  Alert and asymptomatic of hypotensive symptoms.

## 2016-10-08 NOTE — Progress Notes (Signed)
PROGRESS NOTE                                                                                                                                                                                                             Patient Demographics:    Wanda [name], is a 56 y.o. female, DOB - March 14, 1961, CWC:[id number]  Admit date - 10/07/2016   Admitting Physician Toy Baker, MD  Outpatient Primary MD for the patient is ROBERTS, Sharol Given, MD  LOS - 1  Chief Complaint  Patient presents with  . Abdominal Pain       Brief Narrative   History of ESRD on PD, gastric bypass surgery, hypothyroidism, chronic hypotension, DM type II was admitted to the hospital with abdominal pain was diagnosed with PD catheter-induced peritonitis. Placed on antibiotics, currently renal following and she is currently on HD.   Subjective:    Wanda [name] today has, No headache, No chest pain, mild abdominal pain - No Nausea, No new weakness tingling or numbness, No Cough - SOB.     Assessment  & Plan :     1.PD catheter-induced peritonitis. Monitor cultures, PD catheter not being used, likely to be pulled Monday for renal, continue empiric IV antibiotics and monitor. Radically improved.  2. ESRD. Currently on HD. Renal following.  3. Chronic hypotension. Stable asymptomatic.  4. Hypothyroidism. Stable TSH continue home dose Synthroid.  5. DM type II. Continue present regimen and follow CBGs.  CBG (last 3)   Recent Labs  10/08/16 0455 10/08/16 0604 10/08/16 0752  GLUCAP 66 109* 41      Family Communication  : None  Code Status :  Full  Diet : Diet renal/carb modified with fluid restriction Diet-HS Snack? Nothing; Room service appropriate? Yes; Fluid consistency: Thin    Disposition Plan  :  TBD  Consults  :  Renal  Procedures  :  None  DVT Prophylaxis  :  Heparin    Lab Results  Component Value Date   PLT  206 10/08/2016    Inpatient Medications  Scheduled Meds: . allopurinol  100 mg Oral QHS  . cefTAZidime (FORTAZ)  IV  1 g Intravenous Q24H  . guaiFENesin  600 mg Oral BID  . insulin aspart  0-9 Units Subcutaneous Q4H  . insulin aspart  10 Units Subcutaneous TID WC  .  levothyroxine  100 mcg Oral QAC breakfast  . pregabalin  75 mg Oral BID  . sevelamer carbonate  2.4 g Oral TID WC  . sodium chloride flush  3 mL Intravenous Q12H   Continuous Infusions: PRN Meds:.sodium chloride, sodium chloride, alteplase, heparin, heparin, lidocaine (PF), lidocaine-prilocaine, ondansetron **OR** ondansetron (ZOFRAN) IV, oxyCODONE-acetaminophen, pentafluoroprop-tetrafluoroeth  Antibiotics  :    Anti-infectives    Start     Dose/Rate Route Frequency Ordered Stop   10/08/16 1800  cefTAZidime (FORTAZ) 1 g in dextrose 5 % 50 mL IVPB     1 g 100 mL/hr over 30 Minutes Intravenous Every 24 hours 10/08/16 1055           Objective:   Vitals:   10/07/16 2119 10/08/16 0436 10/08/16 0900 10/08/16 0940  BP: 93/67 108/70 (!) 85/51 (!) 94/52  Pulse: (!) 111 (!) 111 (!) 106 (!) 111  Resp: 18 16 15    Temp: 98.2 F (36.8 C) 97.9 F (36.6 C) 99 F (37.2 C)   TempSrc: Oral Oral Oral   SpO2: 100% 91% 100%   Weight: 87.5 kg (192 lb 12.8 oz)     Height:        Wt Readings from Last 3 Encounters:  10/07/16 87.5 kg (192 lb 12.8 oz)  10/13/15 105.8 kg (233 lb 3.2 oz)  08/18/15 104.8 kg (231 lb)     Intake/Output Summary (Last 24 hours) at 10/08/16 1100 Last data filed at 10/08/16 1025  Gross per 24 hour  Intake             3102 ml  Output                0 ml  Net             3102 ml     Physical Exam  Awake Alert, Oriented X 3, No new F.N deficits, Normal affect [place].AT,PERRAL Supple Neck,No JVD, No cervical lymphadenopathy appriciated.  Symmetrical Chest wall movement, Good air movement bilaterally, CTAB RRR,No Gallops,Rubs or new Murmurs, No Parasternal Heave +ve B.Sounds, Abd Soft, Minimal  generalized tenderness, PD catheter in place, No organomegaly appriciated, No rebound - guarding or rigidity. No Cyanosis, Clubbing or edema, No new Rash or bruise, right arm AV fistula    Data Review:    CBC  Recent Labs Lab 10/07/16 1718 10/08/16 0220  WBC 6.4 8.4  HGB 12.4 10.2*  HCT 37.3 31.4*  PLT 237 206  MCV 82.5 83.3  MCH 27.4 27.1  MCHC 33.2 32.5  RDW 14.7 15.0  LYMPHSABS 0.8  --   MONOABS 0.3  --   EOSABS 0.0  --   BASOSABS 0.0  --     Chemistries   Recent Labs Lab 10/07/16 1718 10/08/16 0220  NA 135 138  K 3.3* 3.1*  CL 97* 103  CO2 26 26  GLUCOSE 267* 83  BUN 21* 22*  CREATININE 8.08* 7.78*  CALCIUM 8.0* 7.3*  MG  --  1.4*  AST 17 15  ALT 17 12*  ALKPHOS 118 88  BILITOT 0.6 0.3   ------------------------------------------------------------------------------------------------------------------ No results for input(s): CHOL, HDL, LDLCALC, TRIG, CHOLHDL, LDLDIRECT in the last 72 hours.  Lab Results  Component Value Date   HGBA1C 7.6 (H) 07/10/2014   ------------------------------------------------------------------------------------------------------------------  Recent Labs  10/08/16 0220  TSH 2.904   ------------------------------------------------------------------------------------------------------------------ No results for input(s): VITAMINB12, FOLATE, FERRITIN, TIBC, IRON, RETICCTPCT in the last 72 hours.  Coagulation profile No results for input(s): INR, PROTIME in the last  168 hours.  No results for input(s): DDIMER in the last 72 hours.  Cardiac Enzymes No results for input(s): CKMB, TROPONINI, MYOGLOBIN in the last 168 hours.  Invalid input(s): CK ------------------------------------------------------------------------------------------------------------------ No results found for: BNP  Micro Results Recent Results (from the past 240 hour(s))  Gram stain     Status: None   Collection Time: 10/07/16  4:21 PM  Result  Value Ref Range Status   Specimen Description FLUID PERITONEAL DIALYSATE  Final   Special Requests NONE  Final   Gram Stain   Final    ABUNDANT WBC PRESENT, PREDOMINANTLY PMN NO ORGANISMS SEEN    Report Status 10/07/2016 FINAL  Final  Culture, body fluid-bottle     Status: None (Preliminary result)   Collection Time: 10/07/16  4:21 PM  Result Value Ref Range Status   Specimen Description FLUID PERITONEAL DIALYSATE  Final   Special Requests BOTTLES DRAWN AEROBIC AND ANAEROBIC 10C  Final   Gram Stain   Final    GRAM NEGATIVE RODS IN BOTH AEROBIC AND ANAEROBIC BOTTLES CRITICAL RESULT CALLED TO, READ BACK BY AND VERIFIED WITH: J WOODY,RN @0523  10/08/16 MKELLY,MLT    Culture PENDING  Incomplete   Report Status PENDING  Incomplete  Blood culture (routine x 2)     Status: None (Preliminary result)   Collection Time: 10/07/16  5:20 PM  Result Value Ref Range Status   Specimen Description BLOOD LEFT HAND  Final   Special Requests IN PEDIATRIC BOTTLE 4CC  Final   Culture PENDING  Incomplete   Report Status PENDING  Incomplete  Blood culture (routine x 2)     Status: None (Preliminary result)   Collection Time: 10/07/16  5:25 PM  Result Value Ref Range Status   Specimen Description BLOOD LEFT ANTECUBITAL  Final   Special Requests   Final    BOTTLES DRAWN AEROBIC AND ANAEROBIC BLUE 10CC RED 5CC   Culture PENDING  Incomplete   Report Status PENDING  Incomplete    Radiology Reports Dg Chest 2 View  Result Date: 10/07/2016 CLINICAL DATA:  Cough x2 weeks EXAM: CHEST  2 VIEW COMPARISON:  08/20/2014 FINDINGS: The heart size and mediastinal contours are within normal limits. Both lungs are clear. The visualized skeletal structures are unremarkable. IMPRESSION: No active cardiopulmonary disease. Electronically Signed   By: Ashley Royalty M.D.   On: 10/07/2016 18:16    Time Spent in minutes  30   SINGH,PRASHANT K M.D on 10/08/2016 at 11:00 AM  Between 7am to 7pm - Pager - [phone]  After  7pm go to [email or web address] - password [facility]  [facility] -  Office  [phone]

## 2016-10-08 NOTE — Progress Notes (Signed)
CRITICAL LAB VALUE LACTIC ACID-3.6 On call notified.  Rushie Goltz, RN

## 2016-10-08 NOTE — Progress Notes (Signed)
Lab called and informed RN of result for peritoneal fluid. Peritoneal fluid has gram negative rods. RN asked patient when specimen has been obtained and RN was informed that it was taken from her at her Dialysis before being admitted to [facility] ED, around 1330. The specimen has not been collected until 1642. RN will inform on coming RN to notify nephrology.  Ermalinda Memos, RN

## 2016-10-08 NOTE — Progress Notes (Signed)
I  Informed MD Ryan B. Sanford that patient had in antibiotic PD fluid instilled in her peritoneum. MD stated that was ok, and didn't give an order for fluid to be removed at this time.

## 2016-10-08 NOTE — Progress Notes (Signed)
New Admission Note:  Arrival Method: On stretcher from ED Mental Orientation: Alert and Oriented X4 Telemetry: Yes. Called CCMD. Assessment: Completed Skin: Clear Iv: Left AC Pain: 2/10 Tubes: Right Arm AV Fistula, Peritoneal Cath LLQ of abdomen  Safety Measures: Safety Fall Prevention Plan discussed with patient. Admission: Completed [facility] [place] Orientation: Patient has been orientated to the room, unit and the staff. Family:  Orders have been reviewed and implemented. Will continue to monitor the patient. Call light has been placed within reach.  Vassie Moselle, RN  Phone Number: [phone]

## 2016-10-08 NOTE — Progress Notes (Signed)
Pharmacy Antibiotic Note  Wanda [name] is a 56 y.o. female admitted on 10/07/2016 with peritonitis.  Pharmacy has been consulted for vancomycin and ceftazidime dosing. Of note, pt received Vancomycin 1.5g intraperitoneal and cefazolin 1g intraperitoneal on 1/5 in clinic. However, per nephro will now transition to iHD. Pre-HD vancomycin level was therapeutic at 24.  Plan: -Vancomycin 1g IV qHD x1 -Follow-up HD schedule for further vancomycin dosing -Ceftazidime 1gm IV q24h for now until HD regimen established  Height: 5\' 9"  (175.3 cm) Weight: 197 lb 8.5 oz (89.6 kg) IBW/kg (Calculated) : 66.2  Temp (24hrs), Avg:98.3 F (36.8 C), Min:97.9 F (36.6 C), Max:99 F (37.2 C)   Recent Labs Lab 10/07/16 1718 10/07/16 1744 10/07/16 2251 10/08/16 0220 10/08/16 1109  WBC 6.4  --   --  8.4  --   CREATININE 8.08*  --   --  7.78*  --   LATICACIDVEN  --  4.55* 3.6* 2.8*  --   VANCORANDOM  --   --   --   --  24    Estimated Creatinine Clearance: 9.6 mL/min (by C-G formula based on SCr of 7.78 mg/dL (H)).    Allergies  Allergen Reactions  . Hydrocodone-Acetaminophen Other (See Comments)    Severe headache  . Propoxyphene N-Acetaminophen Other (See Comments)    Severe headache  . Cephalosporins Rash    Antimicrobials this admission:  1/6 Ceftazidime >>  1/6 Vancomycin >>   Dose adjustments this admission:  1/6 Vancomycin Pre-HD: 24  Microbiology results:  1/5 BCx: IP 1/5 Peritoneal Fluid: GNR 1/5 Flu: neg  Thank you for allowing pharmacy to be a part of this patient's care.  Arrie Senate, PharmD PGY-1 Pharmacy Resident Pager: [phone] 10/08/2016

## 2016-10-08 NOTE — Consult Note (Signed)
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  PD fluid was purulent. Had exchange and then given 1.5gm IP Vancomycin and 1gm IP Cefazolin.    In ED VSS, lactic acid up.  K low.  PD Fluid Cx +GNRs.  G Stain abundant WBC -- PMNs; no fungus noted  PD Rx: CCPD 3L fill x5 89min dwell; 2.75L Last Fill  Creat (mg/dL)  Date Value  07/10/2014 8.80 (H)   Creatinine, Ser (mg/dL)  Date Value  10/08/2016 7.78 (H)  10/07/2016 8.08 (H)  07/31/2016 6.70 (H)  06/03/2015 5.69 (H)  05/27/2015 11.68 (H)  05/27/2015 10.99 (H)  05/26/2015 10.08 (H)  05/26/2015 9.61 (H)  03/30/2015 6.73 (H)  05/28/2014 5.01 (H)  ] I/Os:  ROS Balance of 12 systems is negative w/ exceptions as above  PMH  Past Medical History:  Diagnosis Date  . Anemia   . Arthritis    "hands" (07/24/2013) pt denies  . Chronic kidney disease, stage IV (severe) ([place])    "never went on dialysis" (07/24/2013)  . Diabetes mellitus type II   . Diabetic peripheral neuropathy ([place])   . Dialysis patient [facility])    Monday Wednesday Friday   . Dyspnea on exertion    pt denies  . Foot pain   . GERD (gastroesophageal reflux disease)   . Gout   . History of chicken pox   . History of measles   . History of mumps   . Hyperlipidemia   . Hypothyroidism   . Migraines    "q 3 months" (07/24/2013)  . Nonspecific abnormal unspecified cardiovascular function study   . Obesity   . PAD (peripheral artery disease) ([place])   . Peripheral neuropathy ([place])   . Peripheral vascular disease  ([place])   . Pneumonia 09/2012  . Renal transplant disorder   . Shingles   . Sleep apnea    "getting ready to get tested again cause dr says I have it" (07/24/2013) pt states was tested and was told she did not have sleep apnea / no cpap  . Unspecified essential hypertension    PSH  Past Surgical History:  Procedure Laterality Date  . AV FISTULA PLACEMENT Right 2000   "lower arm" (07/24/2013)  . AV FISTULA PLACEMENT, RADIOCEPHALIC Right 8295   "upper arm" (07/24/2013)  . CATARACT EXTRACTION W/ INTRAOCULAR LENS  IMPLANT, BILATERAL Bilateral 2011  . [place]; 1987  . CHOLECYSTECTOMY  2011  . COLONOSCOPY    . ENDOMETRIAL CRYOABLATION  1980's  . INTRAUTERINE DEVICE (IUD) INSERTION  2011  . LAPAROSCOPIC GASTRIC SLEEVE RESECTION N/A 05/26/2015   Procedure: LAPAROSCOPIC GASTRIC SLEEVE RESECTION;  Surgeon: Excell Seltzer, MD;  Location: [facility] ORS;  Service: General;  Laterality: N/A;  . TRANSPLANTATION RENAL  2013   "right" (09/04/2012)  . TUBAL LIGATION  1987  . [place]   "RLE; stripped" (09/04/2012)  . VIDEO BRONCHOSCOPY  09/07/2012   Procedure: VIDEO BRONCHOSCOPY WITHOUT FLUORO;  Surgeon: Chesley Mires, MD;  Location: [facility] ENDOSCOPY;  Service:  Cardiopulmonary;  Laterality: Bilateral;   FH  Family History  Problem Relation Age of Onset  . Colon cancer Father     deceased at age 40  . Diabetes Brother   . Neuropathy Neg Hx    SH  reports that she has never smoked. She has never used smokeless tobacco. She reports that she does not drink alcohol or use drugs. Allergies  Allergies  Allergen Reactions  . Hydrocodone-Acetaminophen Other (See Comments)    Severe headache  . Propoxyphene N-Acetaminophen Other (See Comments)    Severe headache  . Cephalosporins Rash   Home medications Prior to Admission medications   Medication Sig Start Date End Date Taking? Authorizing Provider  allopurinol (ZYLOPRIM) 100 MG tablet Take 100 mg by mouth at bedtime.    Yes  Historical Provider, MD  colchicine 0.6 MG tablet Take 0.6 mg by mouth daily as needed (gout attack).  05/09/15  Yes Historical Provider, MD  ergocalciferol (VITAMIN D2) 50000 UNITS capsule Take 50,000 Units by mouth daily.    Yes Historical Provider, MD  hydrOXYzine (ATARAX/VISTARIL) 25 MG tablet Take 25 mg by mouth 4 (four) times daily as needed for itching.  07/13/16  Yes Historical Provider, MD  ibuprofen (ADVIL) 200 MG tablet Take 400 mg by mouth every 6 (six) hours as needed for headache (pain).   Yes Historical Provider, MD  insulin aspart (NOVOLOG FLEXPEN) 100 UNIT/ML FlexPen Inject 10 Units into the skin 3 (three) times daily with meals. 05/28/15  Yes Excell Seltzer, MD  levothyroxine (SYNTHROID, LEVOTHROID) 100 MCG tablet Take 100 mcg by mouth daily. 08/03/15  Yes Historical Provider, MD  Multiple Vitamin (MULTIVITAMIN WITH MINERALS) TABS tablet Take 1 tablet by mouth daily.   Yes Historical Provider, MD  potassium chloride SA (K-DUR,KLOR-CON) 20 MEQ tablet Take 40 mEq by mouth 2 (two) times daily.   Yes Historical Provider, MD  pregabalin (LYRICA) 75 MG capsule Take 75 mg by mouth 2 (two) times daily.  11/03/12  Yes Historical Provider, MD  sevelamer carbonate (RENVELA) 2.4 g PACK Take 2.4 g by mouth See admin instructions. Sprinkle 1 packet (2.4 g) with every meal and with snacks   Yes Historical Provider, MD  ondansetron (ZOFRAN ODT) 4 MG disintegrating tablet Take 1 tablet (4 mg total) by mouth every 8 (eight) hours as needed for nausea or vomiting. Patient not taking: Reported on 10/07/2016 07/31/16   Sherwood Gambler, MD  oxyCODONE-acetaminophen (PERCOCET/ROXICET) 5-325 MG tablet Take 1-2 tablets by mouth every 6 (six) hours as needed for severe pain. Patient not taking: Reported on 10/07/2016 07/05/16   Montine Circle, PA-C  promethazine (PHENERGAN) 25 MG suppository Place 1 suppository (25 mg total) rectally every 6 (six) hours as needed for nausea or vomiting. Patient not taking: Reported  on 10/07/2016 07/31/16   Sherwood Gambler, MD  promethazine (PHENERGAN) 25 MG tablet Take 1 tablet (25 mg total) by mouth every 6 (six) hours as needed for nausea or vomiting. Patient not taking: Reported on 10/07/2016 07/31/16   Sherwood Gambler, MD    Current Medications Scheduled Meds: . allopurinol  100 mg Oral QHS  . guaiFENesin  600 mg Oral BID  . insulin aspart  0-9 Units Subcutaneous Q4H  . insulin aspart  10 Units Subcutaneous TID WC  . levothyroxine  100 mcg Oral QAC breakfast  . pregabalin  75 mg Oral BID  . sevelamer carbonate  2.4 g Oral TID WC  . sodium chloride flush  3 mL Intravenous Q12H  . sodium chloride  flush  3 mL Intravenous Q12H   Continuous Infusions: PRN Meds:.sodium chloride, ondansetron **OR** ondansetron (ZOFRAN) IV, oxyCODONE-acetaminophen, sodium chloride flush  CBC  Recent Labs Lab 10/07/16 1718 10/08/16 0220  WBC 6.4 8.4  NEUTROABS 5.3  --   HGB 12.4 10.2*  HCT 37.3 31.4*  MCV 82.5 83.3  PLT 237 022   Basic Metabolic Panel  Recent Labs Lab 10/07/16 1718 10/08/16 0220  NA 135 138  K 3.3* 3.1*  CL 97* 103  CO2 26 26  GLUCOSE 267* 83  BUN 21* 22*  CREATININE 8.08* 7.78*  CALCIUM 8.0* 7.3*  PHOS  --  2.9    Physical Exam  Blood pressure 108/70, pulse (!) 111, temperature 97.9 F (36.6 C), temperature source Oral, resp. rate 16, height 5\' 9"  (1.753 m), weight 87.5 kg (192 lb 12.8 oz), SpO2 91 %. GEN: NAD, awake, alert, conversant ENT: NCAT EYES: EOMI CV: RRR, no mgr PULM: CTAB ABD: diffusely tender to deep palpation, no r/g.  Quiescent BS SKIN: no rashes/lesions; exit site c/d/i VVK:PQAES LEE   Assessment 14F with purulent peritonitis, recurrent, severe PCM  1. Recurrent Purulent PD Peritonitis, GNR in prelim culture 2. ESRD 3. 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 Hypokalemia 5. DM2 6. S/p gastric bypass 7. Functional RUE AVF 8. URI Sx, flu negative   Plan 1. Given recurrence, severity, and PCM best course is transition to HD either  at least for prolonged period if not indefinely; she is in agreement with this 2. HD today: 4K bath, 1L UF, Qb 350 16g AVF, tight heparin 3. Change ABX to Vanc and Ceftaz IV; pharmacy to assist (ceftaz and Vanc post HD) 4. I think best would be PD cath removal at this time, she is willing to consider, discuss again tomorrow and consider CCS eval 5. Nutrition consult 6. 40 KCl this AM IV   Pearson Grippe MD [phone] pgr 10/08/2016, 7:04 AM

## 2016-10-08 NOTE — Progress Notes (Signed)
CRITICAL LAB VALUE Lactic Acid-2.8 On call notified.    Rushie Goltz, RN

## 2016-10-08 NOTE — Procedures (Signed)
Patient was seen on dialysis and the procedure was supervised.  BFR 350  Via AVF BP is  110/64.   Patient appears to be tolerating treatment well  Anwyn Kriegel A 10/08/2016

## 2016-10-09 DIAGNOSIS — N184 Chronic kidney disease, stage 4 (severe): Secondary | ICD-10-CM

## 2016-10-09 LAB — MRSA PCR SCREENING: MRSA by PCR: NEGATIVE

## 2016-10-09 LAB — HEMOGLOBIN A1C
Hgb A1c MFr Bld: 5.4 % (ref 4.8–5.6)
Hgb A1c MFr Bld: 5.4 % (ref 4.8–5.6)
Mean Plasma Glucose: 108 mg/dL
Mean Plasma Glucose: 108 mg/dL

## 2016-10-09 LAB — GLUCOSE, CAPILLARY
Glucose-Capillary: 100 mg/dL — ABNORMAL HIGH (ref 65–99)
Glucose-Capillary: 111 mg/dL — ABNORMAL HIGH (ref 65–99)
Glucose-Capillary: 121 mg/dL — ABNORMAL HIGH (ref 65–99)
Glucose-Capillary: 172 mg/dL — ABNORMAL HIGH (ref 65–99)
Glucose-Capillary: 73 mg/dL (ref 65–99)
Glucose-Capillary: 88 mg/dL (ref 65–99)

## 2016-10-09 LAB — BASIC METABOLIC PANEL
Anion gap: 7 (ref 5–15)
BUN: 15 mg/dL (ref 6–20)
CO2: 30 mmol/L (ref 22–32)
Calcium: 7.3 mg/dL — ABNORMAL LOW (ref 8.9–10.3)
Chloride: 100 mmol/L — ABNORMAL LOW (ref 101–111)
Creatinine, Ser: 5.46 mg/dL — ABNORMAL HIGH (ref 0.44–1.00)
GFR calc Af Amer: 9 mL/min — ABNORMAL LOW (ref >60)
GFR calc non Af Amer: 8 mL/min — ABNORMAL LOW (ref >60)
Glucose, Bld: 103 mg/dL — ABNORMAL HIGH (ref 65–99)
Potassium: 3.3 mmol/L — ABNORMAL LOW (ref 3.5–5.1)
Sodium: 137 mmol/L (ref 135–145)

## 2016-10-09 LAB — CBC
HCT: 29.6 % — ABNORMAL LOW (ref 36.0–46.0)
Hemoglobin: 9.7 g/dL — ABNORMAL LOW (ref 12.0–15.0)
MCH: 26.6 pg (ref 26.0–34.0)
MCHC: 32.8 g/dL (ref 30.0–36.0)
MCV: 81.3 fL (ref 78.0–100.0)
Platelets: 211 10*3/uL (ref 150–400)
RBC: 3.64 MIL/uL — ABNORMAL LOW (ref 3.87–5.11)
RDW: 14.7 % (ref 11.5–15.5)
WBC: 11.7 10*3/uL — ABNORMAL HIGH (ref 4.0–10.5)

## 2016-10-09 NOTE — Consult Note (Signed)
[facility] Consult Note  Wanda [name] May 31, 1961  [id number].    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 Zachery Dakins) who presented to [facility] 10/07/16 from PD clinic with 24 hours of worsening, generalized abdominal pain associated with diarrhea, nausea, and vomiting. Found to have purulent PD fluid and received PD exchange and intraperitoneal abx prior to leaving the PD clinic. GS/Cx significant for gram negative rods. In ED VSS, lactate was elevated (4.55), and potassium was low (3.3). Patient was diagnosed with spontaneous bacterial peritonitis and admitted for IV antibiotics (ceftazidime and vancomycin). Patient reports her abdominal pain has improved since admission. Pain is rated as 6/10. Reports associated non-bloody diarrhea. Also endorses nausea and decreased appetite since PD placement in May 2017. Patient denies use of blood thinning medications other than what she receives during dailysis.  General surgery has been consulted regarding removal of PD cath during this hospital admission. PD cath was placed in [place] by by Dr. Demetrius Revel 02/26/16.  ROS: Review of Systems  Constitutional: Positive for weight loss (intentional 100lbs s/p gastric bypass). Negative for fever.  Respiratory: Positive for cough. Negative for hemoptysis and shortness of breath.   Cardiovascular: Negative for chest pain and palpitations.  Gastrointestinal: Positive for abdominal pain, diarrhea and nausea. Negative for blood in stool, heartburn and vomiting.  All other systems reviewed and are negative.  Family History  Problem Relation Age of Onset   . Colon cancer Father     deceased at age 57  . Diabetes Brother   . Neuropathy Neg Hx     Past Medical History:  Diagnosis Date  . Anemia   . Arthritis    "hands" (07/24/2013) pt denies  . Chronic kidney disease, stage IV (severe) ([place])    "never went on dialysis" (07/24/2013)  . Diabetes mellitus type II   . Diabetic peripheral neuropathy ([place])   . Dialysis patient [facility])    Monday Wednesday Friday   . Dyspnea on exertion    pt denies  . Foot pain   . GERD (gastroesophageal reflux disease)   . Gout   . History of chicken pox   . History of measles   . History of mumps   . Hyperlipidemia   . Hypothyroidism   . Migraines    "q 3 months" (07/24/2013)  . Nonspecific abnormal unspecified cardiovascular function study   . Obesity   . PAD (peripheral artery disease) ([place])   . Peripheral neuropathy ([place])   . Peripheral vascular disease ([place])   . Pneumonia 09/2012  . Renal transplant disorder   . Shingles   . Sleep apnea    "getting ready to get tested again cause dr says I have it" (07/24/2013) pt states was tested and was told she did not have sleep apnea / no cpap  . Unspecified essential hypertension     Past Surgical History:  Procedure Laterality Date  . AV FISTULA PLACEMENT Right 2000   "lower arm" (07/24/2013)  . AV FISTULA PLACEMENT, RADIOCEPHALIC Right 6789   "upper arm" (07/24/2013)  . CATARACT EXTRACTION W/ INTRAOCULAR LENS  IMPLANT, BILATERAL Bilateral 2011  . [place]; 1987  . CHOLECYSTECTOMY  2011  . COLONOSCOPY    .  ENDOMETRIAL CRYOABLATION  1980's  . INTRAUTERINE DEVICE (IUD) INSERTION  2011  . LAPAROSCOPIC GASTRIC SLEEVE RESECTION N/A 05/26/2015   Procedure: LAPAROSCOPIC GASTRIC SLEEVE RESECTION;  Surgeon: Excell Seltzer, MD;  Location: [facility] ORS;  Service: General;  Laterality: N/A;  . TRANSPLANTATION RENAL  2013   "right" (09/04/2012)  . TUBAL LIGATION  1987  . [place]   "RLE; stripped" (09/04/2012)  . VIDEO  BRONCHOSCOPY  09/07/2012   Procedure: VIDEO BRONCHOSCOPY WITHOUT FLUORO;  Surgeon: Chesley Mires, MD;  Location: [facility] ENDOSCOPY;  Service: Cardiopulmonary;  Laterality: Bilateral;    Social History:  reports that she has never smoked. She has never used smokeless tobacco. She reports that she does not drink alcohol or use drugs.  Allergies:  Allergies  Allergen Reactions  . Hydrocodone-Acetaminophen Other (See Comments)    Severe headache  . Propoxyphene N-Acetaminophen Other (See Comments)    Severe headache  . Cephalosporins Rash    Medications Prior to Admission  Medication Sig Dispense Refill  . allopurinol (ZYLOPRIM) 100 MG tablet Take 100 mg by mouth at bedtime.     . colchicine 0.6 MG tablet Take 0.6 mg by mouth daily as needed (gout attack).   6  . ergocalciferol (VITAMIN D2) 50000 UNITS capsule Take 50,000 Units by mouth daily.     . hydrOXYzine (ATARAX/VISTARIL) 25 MG tablet Take 25 mg by mouth 4 (four) times daily as needed for itching.   10  . ibuprofen (ADVIL) 200 MG tablet Take 400 mg by mouth every 6 (six) hours as needed for headache (pain).    . insulin aspart (NOVOLOG FLEXPEN) 100 UNIT/ML FlexPen Inject 10 Units into the skin 3 (three) times daily with meals. 15 mL 11  . levothyroxine (SYNTHROID, LEVOTHROID) 100 MCG tablet Take 100 mcg by mouth daily.  4  . Multiple Vitamin (MULTIVITAMIN WITH MINERALS) TABS tablet Take 1 tablet by mouth daily.    . potassium chloride SA (K-DUR,KLOR-CON) 20 MEQ tablet Take 40 mEq by mouth 2 (two) times daily.    . pregabalin (LYRICA) 75 MG capsule Take 75 mg by mouth 2 (two) times daily.     . sevelamer carbonate (RENVELA) 2.4 g PACK Take 2.4 g by mouth See admin instructions. Sprinkle 1 packet (2.4 g) with every meal and with snacks    . ondansetron (ZOFRAN ODT) 4 MG disintegrating tablet Take 1 tablet (4 mg total) by mouth every 8 (eight) hours as needed for nausea or vomiting. (Patient not taking: Reported on 10/07/2016) 10 tablet 0  .  oxyCODONE-acetaminophen (PERCOCET/ROXICET) 5-325 MG tablet Take 1-2 tablets by mouth every 6 (six) hours as needed for severe pain. (Patient not taking: Reported on 10/07/2016) 10 tablet 0  . promethazine (PHENERGAN) 25 MG suppository Place 1 suppository (25 mg total) rectally every 6 (six) hours as needed for nausea or vomiting. (Patient not taking: Reported on 10/07/2016) 6 each 0  . promethazine (PHENERGAN) 25 MG tablet Take 1 tablet (25 mg total) by mouth every 6 (six) hours as needed for nausea or vomiting. (Patient not taking: Reported on 10/07/2016) 15 tablet 0    Blood pressure 96/71, pulse (!) 112, temperature 99.7 F (37.6 C), temperature source Oral, resp. rate 17, height _0  (1.753 m), weight 91.9 kg (202 lb 8 oz), SpO2 98 %. Physical Exam: General: pleasant, obese AA female who is laying in bed in NAD HEENT: head is normocephalic, atraumatic.  Mouth is pink and moist Heart: regular, rate, and rhythm.  No obvious  murmurs, gallops, or rubs noted.  Palpable pedal pulses bilaterally Lungs: CTAB, no wheezes, rhonchi, or rales noted.  Respiratory effort nonlabored Abd: soft, tender to light palpation worse over left hemiabdomen, peritonitis is present, +BS, multiple abdominal scars. MS: all 4 extremities are symmetrical with no cyanosis or clubbing Skin: warm and dry with no masses, lesions, or rashes Psych: A&Ox3 with an appropriate affect. Neuro: moves all extremities spontaneously, normal speech  Results for orders placed or performed during the hospital encounter of 10/07/16 (from the past 48 hour(s))  Gram stain     Status: None   Collection Time: 10/07/16  4:21 PM  Result Value Ref Range   Specimen Description FLUID PERITONEAL DIALYSATE    Special Requests NONE    Gram Stain      ABUNDANT WBC PRESENT, PREDOMINANTLY PMN NO ORGANISMS SEEN    Report Status 10/07/2016 FINAL   Culture, body fluid-bottle     Status: None (Preliminary result)   Collection Time: 10/07/16  4:21 PM   Result Value Ref Range   Specimen Description FLUID PERITONEAL DIALYSATE    Special Requests BOTTLES DRAWN AEROBIC AND ANAEROBIC 10C    Gram Stain      GRAM NEGATIVE RODS IN BOTH AEROBIC AND ANAEROBIC BOTTLES CRITICAL RESULT CALLED TO, READ BACK BY AND VERIFIED WITH: J WOODY,RN _0  10/08/16 MKELLY,MLT    Culture NO GROWTH < 24 HOURS    Report Status PENDING   Body fluid cell count with differential     Status: Abnormal   Collection Time: 10/07/16  4:42 PM  Result Value Ref Range   Fluid Type-FCT FLUID     Comment: PERITONEAL CORRECTED ON 01/05 AT 1647: PREVIOUSLY REPORTED AS Peritoneal    Color, Fluid STRAW (A) YELLOW   Appearance, Fluid CLOUDY (A) CLEAR   WBC, Fluid 9,836 (H) 0 - 1,000 cu mm   Neutrophil Count, Fluid 93 (H) 0 - 25 %   Lymphs, Fluid 3 %   Monocyte-Macrophage-Serous Fluid 4 (L) 50 - 90 %   Eos, Fluid 0 %   Other Cells, Fluid 0 %  CBC with Differential     Status: None   Collection Time: 10/07/16  5:18 PM  Result Value Ref Range   WBC 6.4 4.0 - 10.5 K/uL   RBC 4.52 3.87 - 5.11 MIL/uL   Hemoglobin 12.4 12.0 - 15.0 g/dL   HCT 37.3 36.0 - 46.0 %   MCV 82.5 78.0 - 100.0 fL   MCH 27.4 26.0 - 34.0 pg   MCHC 33.2 30.0 - 36.0 g/dL   RDW 14.7 11.5 - 15.5 %   Platelets 237 150 - 400 K/uL   Neutrophils Relative % 83 %   Neutro Abs 5.3 1.7 - 7.7 K/uL   Lymphocytes Relative 12 %   Lymphs Abs 0.8 0.7 - 4.0 K/uL   Monocytes Relative 5 %   Monocytes Absolute 0.3 0.1 - 1.0 K/uL   Eosinophils Relative 0 %   Eosinophils Absolute 0.0 0.0 - 0.7 K/uL   Basophils Relative 0 %   Basophils Absolute 0.0 0.0 - 0.1 K/uL  Comprehensive metabolic panel     Status: Abnormal   Collection Time: 10/07/16  5:18 PM  Result Value Ref Range   Sodium 135 135 - 145 mmol/L   Potassium 3.3 (L) 3.5 - 5.1 mmol/L   Chloride 97 (L) 101 - 111 mmol/L   CO2 26 22 - 32 mmol/L   Glucose, Bld 267 (H) 65 - 99 mg/dL   BUN 21 (  H) 6 - 20 mg/dL   Creatinine, Ser 8.08 (H) 0.44 - 1.00 mg/dL    Calcium 8.0 (L) 8.9 - 10.3 mg/dL   Total Protein 4.9 (L) 6.5 - 8.1 g/dL   Albumin 1.4 (L) 3.5 - 5.0 g/dL   AST 17 15 - 41 U/L   ALT 17 14 - 54 U/L   Alkaline Phosphatase 118 38 - 126 U/L   Total Bilirubin 0.6 0.3 - 1.2 mg/dL   GFR calc non Af Amer 5 (L) >60 mL/min   GFR calc Af Amer 6 (L) >60 mL/min    Comment: (NOTE) The eGFR has been calculated using the CKD EPI equation. This calculation has not been validated in all clinical situations. eGFR's persistently <60 mL/min signify possible Chronic Kidney Disease.    Anion gap 12 5 - 15  Lipase, blood     Status: Abnormal   Collection Time: 10/07/16  5:18 PM  Result Value Ref Range   Lipase <10 (L) 11 - 51 U/L    Comment: REPEATED TO VERIFY  Blood culture (routine x 2)     Status: None (Preliminary result)   Collection Time: 10/07/16  5:20 PM  Result Value Ref Range   Specimen Description BLOOD LEFT HAND    Special Requests IN PEDIATRIC BOTTLE 4CC    Culture NO GROWTH < 24 HOURS    Report Status PENDING   Blood culture (routine x 2)     Status: None (Preliminary result)   Collection Time: 10/07/16  5:25 PM  Result Value Ref Range   Specimen Description BLOOD LEFT ANTECUBITAL    Special Requests      BOTTLES DRAWN AEROBIC AND ANAEROBIC BLUE 10CC RED 5CC   Culture NO GROWTH < 24 HOURS    Report Status PENDING   I-Stat CG4 Lactic Acid, ED     Status: Abnormal   Collection Time: 10/07/16  5:44 PM  Result Value Ref Range   Lactic Acid, Venous 4.55 (HH) 0.5 - 1.9 mmol/L   Comment NOTIFIED PHYSICIAN   Influenza panel by PCR (type A & B, H1N1)     Status: None   Collection Time: 10/07/16  7:54 PM  Result Value Ref Range   Influenza A By PCR NEGATIVE NEGATIVE   Influenza B By PCR NEGATIVE NEGATIVE    Comment: (NOTE) The Xpert Xpress Flu assay is intended as an aid in the diagnosis of  influenza and should not be used as a sole basis for treatment.  This  assay is FDA approved for nasopharyngeal swab specimens only. Nasal   washings and aspirates are unacceptable for Xpert Xpress Flu testing.   Glucose, capillary     Status: Abnormal   Collection Time: 10/07/16  9:51 PM  Result Value Ref Range   Glucose-Capillary 166 (H) 65 - 99 mg/dL  Lactic acid, plasma     Status: Abnormal   Collection Time: 10/07/16 10:51 PM  Result Value Ref Range   Lactic Acid, Venous 3.6 (HH) 0.5 - 1.9 mmol/L    Comment: CRITICAL RESULT CALLED TO, READ BACK BY AND VERIFIED WITH: MAHOUD Z,RN 10/07/16 2329 WAYK   Glucose, capillary     Status: Abnormal   Collection Time: 10/08/16 12:41 AM  Result Value Ref Range   Glucose-Capillary 105 (H) 65 - 99 mg/dL  Magnesium     Status: Abnormal   Collection Time: 10/08/16  2:20 AM  Result Value Ref Range   Magnesium 1.4 (L) 1.7 - 2.4 mg/dL  Phosphorus  Status: None   Collection Time: 10/08/16  2:20 AM  Result Value Ref Range   Phosphorus 2.9 2.5 - 4.6 mg/dL  TSH     Status: None   Collection Time: 10/08/16  2:20 AM  Result Value Ref Range   TSH 2.904 0.350 - 4.500 uIU/mL    Comment: Performed by a 3rd Generation assay with a functional sensitivity of <=0.01 uIU/mL.  Comprehensive metabolic panel     Status: Abnormal   Collection Time: 10/08/16  2:20 AM  Result Value Ref Range   Sodium 138 135 - 145 mmol/L   Potassium 3.1 (L) 3.5 - 5.1 mmol/L   Chloride 103 101 - 111 mmol/L   CO2 26 22 - 32 mmol/L   Glucose, Bld 83 65 - 99 mg/dL   BUN 22 (H) 6 - 20 mg/dL   Creatinine, Ser 7.78 (H) 0.44 - 1.00 mg/dL   Calcium 7.3 (L) 8.9 - 10.3 mg/dL   Total Protein 3.8 (L) 6.5 - 8.1 g/dL   Albumin 1.1 (L) 3.5 - 5.0 g/dL   AST 15 15 - 41 U/L   ALT 12 (L) 14 - 54 U/L   Alkaline Phosphatase 88 38 - 126 U/L   Total Bilirubin 0.3 0.3 - 1.2 mg/dL   GFR calc non Af Amer 5 (L) >60 mL/min   GFR calc Af Amer 6 (L) >60 mL/min    Comment: (NOTE) The eGFR has been calculated using the CKD EPI equation. This calculation has not been validated in all clinical situations. eGFR's persistently <60  mL/min signify possible Chronic Kidney Disease.    Anion gap 9 5 - 15  CBC     Status: Abnormal   Collection Time: 10/08/16  2:20 AM  Result Value Ref Range   WBC 8.4 4.0 - 10.5 K/uL   RBC 3.77 (L) 3.87 - 5.11 MIL/uL   Hemoglobin 10.2 (L) 12.0 - 15.0 g/dL   HCT 31.4 (L) 36.0 - 46.0 %   MCV 83.3 78.0 - 100.0 fL   MCH 27.1 26.0 - 34.0 pg   MCHC 32.5 30.0 - 36.0 g/dL   RDW 15.0 11.5 - 15.5 %   Platelets 206 150 - 400 K/uL  Lactic acid, plasma     Status: Abnormal   Collection Time: 10/08/16  2:20 AM  Result Value Ref Range   Lactic Acid, Venous 2.8 (HH) 0.5 - 1.9 mmol/L    Comment: CRITICAL RESULT CALLED TO, READ BACK BY AND VERIFIED WITH: MARSHALL C,RN 10/08/16 0258 WAYK   Glucose, capillary     Status: None   Collection Time: 10/08/16  4:13 AM  Result Value Ref Range   Glucose-Capillary 67 65 - 99 mg/dL  Glucose, capillary     Status: None   Collection Time: 10/08/16  4:55 AM  Result Value Ref Range   Glucose-Capillary 66 65 - 99 mg/dL  Glucose, capillary     Status: Abnormal   Collection Time: 10/08/16  6:04 AM  Result Value Ref Range   Glucose-Capillary 109 (H) 65 - 99 mg/dL  Glucose, capillary     Status: None   Collection Time: 10/08/16  7:52 AM  Result Value Ref Range   Glucose-Capillary 91 65 - 99 mg/dL  Vancomycin, random     Status: None   Collection Time: 10/08/16 11:09 AM  Result Value Ref Range   Vancomycin Rm 24     Comment:        Random Vancomycin therapeutic range is dependent on dosage and time  of specimen collection. A peak range is 20.0-40.0 ug/mL A trough range is 5.0-15.0 ug/mL          Glucose, capillary     Status: None   Collection Time: 10/08/16  3:48 PM  Result Value Ref Range   Glucose-Capillary 96 65 - 99 mg/dL  Glucose, capillary     Status: Abnormal   Collection Time: 10/08/16  7:51 PM  Result Value Ref Range   Glucose-Capillary 151 (H) 65 - 99 mg/dL  Glucose, capillary     Status: None   Collection Time: 10/09/16 12:35 AM   Result Value Ref Range   Glucose-Capillary 73 65 - 99 mg/dL  MRSA PCR Screening     Status: None   Collection Time: 10/09/16  3:48 AM  Result Value Ref Range   MRSA by PCR NEGATIVE NEGATIVE    Comment:        The GeneXpert MRSA Assay (FDA approved for NASAL specimens only), is one component of a comprehensive MRSA colonization surveillance program. It is not intended to diagnose MRSA infection nor to guide or monitor treatment for MRSA infections.   Glucose, capillary     Status: Abnormal   Collection Time: 10/09/16  4:00 AM  Result Value Ref Range   Glucose-Capillary 100 (H) 65 - 99 mg/dL  Glucose, capillary     Status: None   Collection Time: 10/09/16  8:07 AM  Result Value Ref Range   Glucose-Capillary 88 65 - 99 mg/dL   Dg Chest 2 View  Result Date: 10/07/2016 CLINICAL DATA:  Cough x2 weeks EXAM: CHEST  2 VIEW COMPARISON:  08/20/2014 FINDINGS: The heart size and mediastinal contours are within normal limits. Both lungs are clear. The visualized skeletal structures are unremarkable. IMPRESSION: No active cardiopulmonary disease. Electronically Signed   By: Ashley Royalty M.D.   On: 10/07/2016 18:16    Assessment/Plan Recurrent spontaneous bacterial peritonitis - GS positive for gram negative rods. No growth on blood or peritoneal fluid cultures. Patient already has working HD fistula and successfully received HD yesterday. Patient agrees to surgical removal of PD cath.  FEN: renal/carb modified diet ID: ceftazidine, Vancymycin VTE: [place] heparin   Plan: surgical removal of PD cath is appropriate given recurrent peritonitis and established HD access. Will make NPO after midnight in anticipation of PD cath removal tomorrow and discuss timing of surgery with MD.   Jill Alexanders, [facility] 10/09/2016, 9:50 AM Pager: [phone] Consults: [phone] Mon-Fri 7:00 am-4:30 pm Sat-Sun 7:00 am-11:30 am

## 2016-10-09 NOTE — Progress Notes (Signed)
Admit: 10/07/2016 LOS: 2  35F with recurrent purulent peritonitis, severe PCM, transition to HD  Subjective:  HD yesterday using AVF; 1L UF, 4K On Vanc/Ceftaz B Cx NGTD x2 PD Fluid Cx GNRs Pt firm that she wishes to go to HD, agrees and desires PD cath removal    01/06 0701 - 01/07 0700 In: 490 [P.O.:240; IV Piggyback:250] Out: 1000   Filed Weights   10/08/16 1050 10/08/16 1500 10/08/16 2002  Weight: 89.6 kg (197 lb 8.5 oz) 88.4 kg (194 lb 14.2 oz) 91.9 kg (202 lb 8 oz)    Scheduled Meds: . allopurinol  100 mg Oral QHS  . cefTAZidime (FORTAZ)  IV  1 g Intravenous Q24H  . feeding supplement (ENSURE ENLIVE)  237 mL Oral BID BM  . guaiFENesin  600 mg Oral BID  . heparin subcutaneous  5,000 Units Subcutaneous Q8H  . insulin aspart  0-9 Units Subcutaneous Q4H  . insulin aspart  10 Units Subcutaneous TID WC  . levothyroxine  100 mcg Oral QAC breakfast  . pregabalin  75 mg Oral Daily  . sevelamer carbonate  2.4 g Oral TID WC  . sodium chloride flush  3 mL Intravenous Q12H   Continuous Infusions: PRN Meds:.acetaminophen, ondansetron **OR** ondansetron (ZOFRAN) IV, oxyCODONE-acetaminophen  Current Labs: reviewed  PD Fluid Cx at [place]: pending PD FLuid Cx 1/5: GNRs B Cx x2 1/5: NGTD   Physical Exam:  Blood pressure (!) 111/56, pulse (!) 107, temperature 98.3 F (36.8 C), temperature source Oral, resp. rate 18, height 5\' 9"  (1.753 m), weight 91.9 kg (202 lb 8 oz), SpO2 98 %. GEN: NAD, awake, alert, conversant ENT: NCAT EYES: EOMI CV: RRR, no mgr PULM: CTAB ABD: diffusely tender to deep palpation, no r/g.  Quiescent BS SKIN: no rashes/lesions; exit site c/d/i PPJ:KDTOI LEE  A 1. Recurrent Purulent PD Peritonitis, GNR in prelim culture on Vanc/Cefaz; also has outpatient PD Cx from [facility] 2. ESRD; back on HD during this admission 3. 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 Hypokalemia 5. DM2 6. S/p gastric bypass 7. Functional RUE AVF 8. URI Sx, flu negative  P  CCS consult to  eval for PD cath removal  Cont HD on MWF schedule  CLIP pt tomorrow, likely to [place], await cultures  Labs ordered this AM   Pearson Grippe MD 10/09/2016, 8:59 AM   Recent Labs Lab 10/07/16 1718 10/08/16 0220  NA 135 138  K 3.3* 3.1*  CL 97* 103  CO2 26 26  GLUCOSE 267* 83  BUN 21* 22*  CREATININE 8.08* 7.78*  CALCIUM 8.0* 7.3*  PHOS  --  2.9    Recent Labs Lab 10/07/16 1718 10/08/16 0220  WBC 6.4 8.4  NEUTROABS 5.3  --   HGB 12.4 10.2*  HCT 37.3 31.4*  MCV 82.5 83.3  PLT 237 206

## 2016-10-09 NOTE — Progress Notes (Signed)
PROGRESS NOTE                                                                                                                                                                                                             Patient Demographics:    Wanda [name], is a 56 y.o. female, DOB - 22-Dec-1960, NAT:[id number]  Admit date - 10/07/2016   Admitting Physician Toy Baker, MD  Outpatient Primary MD for the patient is ROBERTS, Sharol Given, MD  LOS - 2  Chief Complaint  Patient presents with  . Abdominal Pain       Brief Narrative   History of ESRD on PD, gastric bypass surgery, hypothyroidism, chronic hypotension, DM type II was admitted to the hospital with abdominal pain was diagnosed with PD catheter-induced peritonitis. Placed on antibiotics, currently renal following and she is currently on HD.   Subjective:    Wanda [name] today has, No headache, No chest pain, mild abdominal pain - No Nausea, No new weakness tingling or numbness, No Cough - SOB.     Assessment  & Plan :     1.PD catheter-induced peritonitis. Monitor cultures, PD catheter To be pulled on 10/09/2016 by general surgery, continue empiric IV antibiotics and monitor. Clinically improved.  2. ESRD. Currently on HD. Renal following.  3. Chronic hypotension. Stable asymptomatic.  4. Hypothyroidism. Stable TSH continue home dose Synthroid.  5. DM type II. Continue present regimen and follow CBGs.  CBG (last 3)   Recent Labs  10/09/16 0400 10/09/16 0807 10/09/16 1130  GLUCAP 100* 88 111*      Family Communication  : Son bedside  Code Status :  Full  Diet : Diet renal/carb modified with fluid restriction Diet-HS Snack? Nothing; Room service appropriate? Yes; Fluid consistency: Thin    Disposition Plan  :  TBD  Consults  :  Renal  Procedures  :  None  DVT Prophylaxis  :  Heparin    Lab Results  Component Value Date   PLT 211 10/09/2016    Inpatient Medications  Scheduled Meds: . allopurinol  100 mg Oral QHS  . cefTAZidime (FORTAZ)  IV  1 g Intravenous Q24H  . feeding supplement (ENSURE ENLIVE)  237 mL Oral BID BM  . guaiFENesin  600 mg Oral BID  . heparin subcutaneous  5,000 Units Subcutaneous Q8H  .  insulin aspart  0-9 Units Subcutaneous Q4H  . insulin aspart  10 Units Subcutaneous TID WC  . levothyroxine  100 mcg Oral QAC breakfast  . pregabalin  75 mg Oral Daily  . sevelamer carbonate  2.4 g Oral TID WC  . sodium chloride flush  3 mL Intravenous Q12H   Continuous Infusions: PRN Meds:.acetaminophen, ondansetron **OR** ondansetron (ZOFRAN) IV, oxyCODONE-acetaminophen  Antibiotics  :    Anti-infectives    Start     Dose/Rate Route Frequency Ordered Stop   10/08/16 1800  cefTAZidime (FORTAZ) 1 g in dextrose 5 % 50 mL IVPB     1 g 100 mL/hr over 30 Minutes Intravenous Every 24 hours 10/08/16 1055     10/08/16 1430  vancomycin (VANCOCIN) IVPB 1000 mg/200 mL premix     1,000 mg 200 mL/hr over 60 Minutes Intravenous  Once 10/08/16 1417 10/08/16 1908         Objective:   Vitals:   10/08/16 2229 10/09/16 0040 10/09/16 0404 10/09/16 0942  BP:  (!) 100/57 (!) 111/56 96/71  Pulse:  (!) 102 (!) 107 (!) 112  Resp:  15 18 17   Temp: 98.7 F (37.1 C) 98.3 F (36.8 C) 98.3 F (36.8 C) 99.7 F (37.6 C)  TempSrc: Oral Oral Oral Oral  SpO2:  100% 98% 98%  Weight:      Height:        Wt Readings from Last 3 Encounters:  10/08/16 91.9 kg (202 lb 8 oz)  10/13/15 105.8 kg (233 lb 3.2 oz)  08/18/15 104.8 kg (231 lb)     Intake/Output Summary (Last 24 hours) at 10/09/16 1149 Last data filed at 10/09/16 1009  Gross per 24 hour  Intake              490 ml  Output             1000 ml  Net             -510 ml     Physical Exam  Awake Alert, Oriented X 3, No new F.N deficits, Normal affect [place].AT,PERRAL Supple Neck,No JVD, No cervical lymphadenopathy appriciated.  Symmetrical Chest wall  movement, Good air movement bilaterally, CTAB RRR,No Gallops,Rubs or new Murmurs, No Parasternal Heave +ve B.Sounds, Abd Soft, Minimal generalized tenderness, PD catheter in place, No organomegaly appriciated, No rebound - guarding or rigidity. No Cyanosis, Clubbing or edema, No new Rash or bruise, right arm AV fistula    Data Review:    CBC  Recent Labs Lab 10/07/16 1718 10/08/16 0220 10/09/16 1030  WBC 6.4 8.4 11.7*  HGB 12.4 10.2* 9.7*  HCT 37.3 31.4* 29.6*  PLT 237 206 211  MCV 82.5 83.3 81.3  MCH 27.4 27.1 26.6  MCHC 33.2 32.5 32.8  RDW 14.7 15.0 14.7  LYMPHSABS 0.8  --   --   MONOABS 0.3  --   --   EOSABS 0.0  --   --   BASOSABS 0.0  --   --     Chemistries   Recent Labs Lab 10/07/16 1718 10/08/16 0220  NA 135 138  K 3.3* 3.1*  CL 97* 103  CO2 26 26  GLUCOSE 267* 83  BUN 21* 22*  CREATININE 8.08* 7.78*  CALCIUM 8.0* 7.3*  MG  --  1.4*  AST 17 15  ALT 17 12*  ALKPHOS 118 88  BILITOT 0.6 0.3   ------------------------------------------------------------------------------------------------------------------ No results for input(s): CHOL, HDL, LDLCALC, TRIG, CHOLHDL, LDLDIRECT in the last 72  hours.  Lab Results  Component Value Date   HGBA1C 7.6 (H) 07/10/2014   ------------------------------------------------------------------------------------------------------------------  Recent Labs  10/08/16 0220  TSH 2.904   ------------------------------------------------------------------------------------------------------------------ No results for input(s): VITAMINB12, FOLATE, FERRITIN, TIBC, IRON, RETICCTPCT in the last 72 hours.  Coagulation profile No results for input(s): INR, PROTIME in the last 168 hours.  No results for input(s): DDIMER in the last 72 hours.  Cardiac Enzymes No results for input(s): CKMB, TROPONINI, MYOGLOBIN in the last 168 hours.  Invalid input(s):  CK ------------------------------------------------------------------------------------------------------------------ No results found for: BNP  Micro Results Recent Results (from the past 240 hour(s))  Gram stain     Status: None   Collection Time: 10/07/16  4:21 PM  Result Value Ref Range Status   Specimen Description FLUID PERITONEAL DIALYSATE  Final   Special Requests NONE  Final   Gram Stain   Final    ABUNDANT WBC PRESENT, PREDOMINANTLY PMN NO ORGANISMS SEEN    Report Status 10/07/2016 FINAL  Final  Culture, body fluid-bottle     Status: None (Preliminary result)   Collection Time: 10/07/16  4:21 PM  Result Value Ref Range Status   Specimen Description FLUID PERITONEAL DIALYSATE  Final   Special Requests BOTTLES DRAWN AEROBIC AND ANAEROBIC 10C  Final   Gram Stain   Final    GRAM NEGATIVE RODS IN BOTH AEROBIC AND ANAEROBIC BOTTLES CRITICAL RESULT CALLED TO, READ BACK BY AND VERIFIED WITH: J WOODY,RN @0523  10/08/16 MKELLY,MLT    Culture   Final    GRAM NEGATIVE RODS IDENTIFICATION AND SUSCEPTIBILITIES TO FOLLOW    Report Status PENDING  Incomplete  Blood culture (routine x 2)     Status: None (Preliminary result)   Collection Time: 10/07/16  5:20 PM  Result Value Ref Range Status   Specimen Description BLOOD LEFT HAND  Final   Special Requests IN PEDIATRIC BOTTLE 4CC  Final   Culture NO GROWTH < 24 HOURS  Final   Report Status PENDING  Incomplete  Blood culture (routine x 2)     Status: None (Preliminary result)   Collection Time: 10/07/16  5:25 PM  Result Value Ref Range Status   Specimen Description BLOOD LEFT ANTECUBITAL  Final   Special Requests   Final    BOTTLES DRAWN AEROBIC AND ANAEROBIC BLUE 10CC RED 5CC   Culture NO GROWTH < 24 HOURS  Final   Report Status PENDING  Incomplete  MRSA PCR Screening     Status: None   Collection Time: 10/09/16  3:48 AM  Result Value Ref Range Status   MRSA by PCR NEGATIVE NEGATIVE Final    Comment:        The GeneXpert  MRSA Assay (FDA approved for NASAL specimens only), is one component of a comprehensive MRSA colonization surveillance program. It is not intended to diagnose MRSA infection nor to guide or monitor treatment for MRSA infections.     Radiology Reports Dg Chest 2 View  Result Date: 10/07/2016 CLINICAL DATA:  Cough x2 weeks EXAM: CHEST  2 VIEW COMPARISON:  08/20/2014 FINDINGS: The heart size and mediastinal contours are within normal limits. Both lungs are clear. The visualized skeletal structures are unremarkable. IMPRESSION: No active cardiopulmonary disease. Electronically Signed   By: Ashley Royalty M.D.   On: 10/07/2016 18:16    Time Spent in minutes  30   Debar Plate K M.D on 10/09/2016 at 11:49 AM  Between 7am to 7pm - Pager - [phone]  After 7pm go to [email or web address] - password  [facility] -  Office  [phone]

## 2016-10-09 NOTE — Progress Notes (Signed)
Late entry:  Hypoglycemic Event  CBG: 67  Treatment: 15 GM carbohydrate snack  Symptoms: None  Follow-up CBG: Time:0455 CBG Result:66  Possible Reasons for Event: Inadequate meal intake- pt fell asleep and did not consume snack  Comments/MD notified: [place], on call  Hypoglycemic Event  CBG: 66  Treatment: 15 GM carbohydrate snack  Symptoms: None  Follow-up CBG: Time:0604 CBG Result:109  Possible Reasons for Event: Inadequate meal intake  Comments/MD notified:[place], on call  Rushie Goltz

## 2016-10-10 ENCOUNTER — Inpatient Hospital Stay (HOSPITAL_COMMUNITY): Payer: Medicare HMO | Admitting: Certified Registered Nurse Anesthetist

## 2016-10-10 ENCOUNTER — Encounter (HOSPITAL_COMMUNITY)
Admission: EM | Disposition: A | Discharge status: Home / Self Care | Payer: Self-pay | Source: Home / Self Care | Attending: Internal Medicine

## 2016-10-10 HISTORY — PX: CAPD REMOVAL: SHX5234

## 2016-10-10 LAB — GLUCOSE, CAPILLARY
Glucose-Capillary: 110 mg/dL — ABNORMAL HIGH (ref 65–99)
Glucose-Capillary: 115 mg/dL — ABNORMAL HIGH (ref 65–99)
Glucose-Capillary: 122 mg/dL — ABNORMAL HIGH (ref 65–99)
Glucose-Capillary: 132 mg/dL — ABNORMAL HIGH (ref 65–99)
Glucose-Capillary: 143 mg/dL — ABNORMAL HIGH (ref 65–99)
Glucose-Capillary: 88 mg/dL (ref 65–99)
Glucose-Capillary: 99 mg/dL (ref 65–99)
Glucose-Capillary: 99 mg/dL (ref 65–99)

## 2016-10-10 LAB — CBC
HCT: 29.8 % — ABNORMAL LOW (ref 36.0–46.0)
Hemoglobin: 9.8 g/dL — ABNORMAL LOW (ref 12.0–15.0)
MCH: 27.3 pg (ref 26.0–34.0)
MCHC: 32.9 g/dL (ref 30.0–36.0)
MCV: 83 fL (ref 78.0–100.0)
Platelets: 200 10*3/uL (ref 150–400)
RBC: 3.59 MIL/uL — ABNORMAL LOW (ref 3.87–5.11)
RDW: 15.4 % (ref 11.5–15.5)
WBC: 11.7 10*3/uL — ABNORMAL HIGH (ref 4.0–10.5)

## 2016-10-10 LAB — CULTURE, BODY FLUID W GRAM STAIN -BOTTLE

## 2016-10-10 LAB — PATHOLOGIST SMEAR REVIEW

## 2016-10-10 LAB — CULTURE, BODY FLUID-BOTTLE

## 2016-10-10 SURGERY — CONTINUOUS AMBULATORY PERITONEAL DIALYSIS  (CAPD) CATHETER REMOVAL
Anesthesia: General | Site: Abdomen

## 2016-10-10 MED ORDER — DARBEPOETIN ALFA 60 MCG/0.3ML IJ SOSY
60.0000 ug | PREFILLED_SYRINGE | INTRAMUSCULAR | Status: DC
  Administered 2016-10-11: 60 ug via INTRAVENOUS

## 2016-10-10 MED ORDER — PHENYLEPHRINE HCL 10 MG/ML IJ SOLN
INTRAMUSCULAR | Status: DC | PRN
  Administered 2016-10-10: 80 ug via INTRAVENOUS
  Administered 2016-10-10: 120 ug via INTRAVENOUS
  Administered 2016-10-10: 160 ug via INTRAVENOUS
  Administered 2016-10-10: 80 ug via INTRAVENOUS
  Administered 2016-10-10: 120 ug via INTRAVENOUS
  Administered 2016-10-10 (×2): 80 ug via INTRAVENOUS
  Administered 2016-10-10: 160 ug via INTRAVENOUS
  Administered 2016-10-10 (×2): 80 ug via INTRAVENOUS
  Administered 2016-10-10: 160 ug via INTRAVENOUS

## 2016-10-10 MED ORDER — PROPOFOL 10 MG/ML IV BOLUS
INTRAVENOUS | Status: AC
  Filled 2016-10-10: qty 20

## 2016-10-10 MED ORDER — FENTANYL CITRATE (PF) 100 MCG/2ML IJ SOLN
INTRAMUSCULAR | Status: AC
  Filled 2016-10-10: qty 2

## 2016-10-10 MED ORDER — BUPIVACAINE HCL (PF) 0.25 % IJ SOLN
INTRAMUSCULAR | Status: DC | PRN
  Administered 2016-10-10: 17 mL

## 2016-10-10 MED ORDER — ONDANSETRON HCL 4 MG/2ML IJ SOLN
INTRAMUSCULAR | Status: DC | PRN
  Administered 2016-10-10: 4 mg via INTRAVENOUS

## 2016-10-10 MED ORDER — PROPOFOL 10 MG/ML IV BOLUS
INTRAVENOUS | Status: DC | PRN
  Administered 2016-10-10: 170 mg via INTRAVENOUS

## 2016-10-10 MED ORDER — LIDOCAINE 2% (20 MG/ML) 5 ML SYRINGE
INTRAMUSCULAR | Status: AC
  Filled 2016-10-10: qty 10

## 2016-10-10 MED ORDER — DEXTROSE 5 % IV SOLN: 1.0000 g | INTRAVENOUS | Status: DC

## 2016-10-10 MED ORDER — PHENYLEPHRINE 40 MCG/ML (10ML) SYRINGE FOR IV PUSH (FOR BLOOD PRESSURE SUPPORT)
PREFILLED_SYRINGE | INTRAVENOUS | Status: AC
  Filled 2016-10-10: qty 10

## 2016-10-10 MED ORDER — MIDAZOLAM HCL 5 MG/5ML IJ SOLN
INTRAMUSCULAR | Status: DC | PRN
  Administered 2016-10-10: 2 mg via INTRAVENOUS

## 2016-10-10 MED ORDER — HYDROMORPHONE HCL 1 MG/ML IJ SOLN
0.5000 mg | INTRAMUSCULAR | Status: DC | PRN
  Administered 2016-10-10: 0.5 mg via INTRAVENOUS

## 2016-10-10 MED ORDER — SODIUM CHLORIDE 0.9 % IV SOLN
INTRAVENOUS | Status: DC
  Administered 2016-10-10 (×2): via INTRAVENOUS

## 2016-10-10 MED ORDER — MIDAZOLAM HCL 2 MG/2ML IJ SOLN
INTRAMUSCULAR | Status: AC
  Filled 2016-10-10: qty 2

## 2016-10-10 MED ORDER — CALCITRIOL 0.25 MCG PO CAPS: 0.2500 ug | ORAL_CAPSULE | ORAL | Status: DC

## 2016-10-10 MED ORDER — BUPIVACAINE HCL (PF) 0.25 % IJ SOLN
INTRAMUSCULAR | Status: AC
  Filled 2016-10-10: qty 30

## 2016-10-10 MED ORDER — LIDOCAINE HCL (CARDIAC) 20 MG/ML IV SOLN
INTRAVENOUS | Status: DC | PRN
  Administered 2016-10-10: 80 mg via INTRAVENOUS

## 2016-10-10 MED ORDER — SUCCINYLCHOLINE CHLORIDE 200 MG/10ML IV SOSY
PREFILLED_SYRINGE | INTRAVENOUS | Status: AC
  Filled 2016-10-10: qty 10

## 2016-10-10 MED ORDER — PHENYLEPHRINE 40 MCG/ML (10ML) SYRINGE FOR IV PUSH (FOR BLOOD PRESSURE SUPPORT)
PREFILLED_SYRINGE | INTRAVENOUS | Status: AC
  Filled 2016-10-10: qty 30

## 2016-10-10 MED ORDER — HYDROMORPHONE HCL 1 MG/ML IJ SOLN
INTRAMUSCULAR | Status: AC
  Filled 2016-10-10: qty 0.5

## 2016-10-10 MED ORDER — 0.9 % SODIUM CHLORIDE (POUR BTL) OPTIME
TOPICAL | Status: DC | PRN
  Administered 2016-10-10: 1000 mL

## 2016-10-10 MED ORDER — EPHEDRINE 5 MG/ML INJ
INTRAVENOUS | Status: AC
  Filled 2016-10-10: qty 20

## 2016-10-10 MED ORDER — FENTANYL CITRATE (PF) 100 MCG/2ML IJ SOLN
INTRAMUSCULAR | Status: DC | PRN
  Administered 2016-10-10 (×5): 25 ug via INTRAVENOUS

## 2016-10-10 MED ORDER — ROCURONIUM BROMIDE 50 MG/5ML IV SOSY
PREFILLED_SYRINGE | INTRAVENOUS | Status: AC
  Filled 2016-10-10: qty 5

## 2016-10-10 MED ORDER — DEXTROSE 5 % IV SOLN
1.0000 g | INTRAVENOUS | Status: DC
  Administered 2016-10-10: 1 g via INTRAVENOUS
  Filled 2016-10-10 (×2): qty 10

## 2016-10-10 MED ORDER — ONDANSETRON HCL 4 MG/2ML IJ SOLN
INTRAMUSCULAR | Status: AC
  Filled 2016-10-10: qty 2

## 2016-10-10 SURGICAL SUPPLY — 45 items
ADH SKN CLS APL DERMABOND .7 (GAUZE/BANDAGES/DRESSINGS)
APL SKNCLS STERI-STRIP NONHPOA (GAUZE/BANDAGES/DRESSINGS) ×1
BENZOIN TINCTURE PRP APPL 2/3 (GAUZE/BANDAGES/DRESSINGS) ×1 IMPLANT
BLADE SURG 15 STRL LF DISP TIS (BLADE) ×1 IMPLANT
BLADE SURG 15 STRL SS (BLADE) ×2
CANISTER SUCTION 2500CC (MISCELLANEOUS) ×1 IMPLANT
COVER SURGICAL LIGHT HANDLE (MISCELLANEOUS) ×2 IMPLANT
DERMABOND ADVANCED (GAUZE/BANDAGES/DRESSINGS)
DERMABOND ADVANCED .7 DNX12 (GAUZE/BANDAGES/DRESSINGS) ×1 IMPLANT
DRAPE LAPAROTOMY T 102X78X121 (DRAPES) ×2 IMPLANT
DRSG TEGADERM 2-3/8X2-3/4 SM (GAUZE/BANDAGES/DRESSINGS) ×2 IMPLANT
ELECT CAUTERY BLADE 6.4 (BLADE) ×2 IMPLANT
ELECT REM PT RETURN 9FT ADLT (ELECTROSURGICAL) ×2
ELECTRODE REM PT RTRN 9FT ADLT (ELECTROSURGICAL) ×1 IMPLANT
GAUZE SPONGE 2X2 8PLY STRL LF (GAUZE/BANDAGES/DRESSINGS) IMPLANT
GAUZE SPONGE 4X4 16PLY XRAY LF (GAUZE/BANDAGES/DRESSINGS) ×2 IMPLANT
GLOVE BIO SURGEON STRL SZ 6.5 (GLOVE) ×1 IMPLANT
GLOVE BIO SURGEON STRL SZ7 (GLOVE) ×1 IMPLANT
GLOVE BIO SURGEON STRL SZ7.5 (GLOVE) ×1 IMPLANT
GLOVE BIOGEL PI IND STRL 6.5 (GLOVE) IMPLANT
GLOVE BIOGEL PI IND STRL 7.0 (GLOVE) IMPLANT
GLOVE BIOGEL PI INDICATOR 6.5 (GLOVE) ×1
GLOVE BIOGEL PI INDICATOR 7.0 (GLOVE) ×3
GOWN STRL REUS W/ TWL LRG LVL3 (GOWN DISPOSABLE) ×1 IMPLANT
GOWN STRL REUS W/TWL LRG LVL3 (GOWN DISPOSABLE) ×6
HANDLE SUCTION POOLE (INSTRUMENTS) IMPLANT
KIT BASIN OR (CUSTOM PROCEDURE TRAY) ×2 IMPLANT
KIT ROOM TURNOVER OR (KITS) ×2 IMPLANT
NDL HYPO 25GX1X1/2 BEV (NEEDLE) ×1 IMPLANT
NEEDLE HYPO 25GX1X1/2 BEV (NEEDLE) ×2 IMPLANT
NS IRRIG 1000ML POUR BTL (IV SOLUTION) ×2 IMPLANT
PACK SURGICAL SETUP 50X90 (CUSTOM PROCEDURE TRAY) ×2 IMPLANT
PAD ARMBOARD 7.5X6 YLW CONV (MISCELLANEOUS) ×2 IMPLANT
PENCIL BUTTON HOLSTER BLD 10FT (ELECTRODE) ×2 IMPLANT
SPONGE GAUZE 2X2 STER 10/PKG (GAUZE/BANDAGES/DRESSINGS) ×1
STRIP CLOSURE SKIN 1/2X4 (GAUZE/BANDAGES/DRESSINGS) ×1 IMPLANT
SUCTION POOLE HANDLE (INSTRUMENTS) ×2
SUT MNCRL AB 4-0 PS2 18 (SUTURE) ×3 IMPLANT
SUT VIC AB 3-0 SH 18 (SUTURE) ×1 IMPLANT
SUT VICRYL 0 UR6 27IN ABS (SUTURE) ×1 IMPLANT
SYR BULB 3OZ (MISCELLANEOUS) ×2 IMPLANT
SYR CONTROL 10ML LL (SYRINGE) ×2 IMPLANT
TOWEL OR 17X24 6PK STRL BLUE (TOWEL DISPOSABLE) ×2 IMPLANT
TUBE CONNECTING 12X1/4 (SUCTIONS) ×1 IMPLANT
YANKAUER SUCT BULB TIP NO VENT (SUCTIONS) ×1 IMPLANT

## 2016-10-10 NOTE — Anesthesia Procedure Notes (Signed)
Procedure Name: LMA Insertion Date/Time: 10/10/2016 9:58 AM Performed by: Salli Quarry Kable Haywood Pre-anesthesia Checklist: Patient identified, Emergency Drugs available, Suction available and Patient being monitored Patient Re-evaluated:Patient Re-evaluated prior to inductionOxygen Delivery Method: Circle System Utilized Preoxygenation: Pre-oxygenation with 100% oxygen Intubation Type: IV induction Ventilation: Mask ventilation without difficulty LMA: LMA inserted LMA Size: 4.0 Number of attempts: 1 Placement Confirmation: positive ETCO2 Tube secured with: Tape Dental Injury: Teeth and Oropharynx as per pre-operative assessment

## 2016-10-10 NOTE — Progress Notes (Signed)
Patient ID: Wanda [name], female   DOB: 06/17/1961, 56 y.o.   MRN: [id number]  Examined patient and discussed procedure.  There is a very long subcutaneous tunnel, but hopefully we can just cut down on the skin insertion site, free up the cuff, and remove the catheter without the need for a second counterincision.  The surgical procedure has been discussed with the patient.  Potential risks, benefits, alternative treatments, and expected outcomes have been explained.  All of the patient's questions at this time have been answered.  The likelihood of reaching the patient's treatment goal is good.  The patient understand the proposed surgical procedure and wishes to proceed.   Imogene Burn. Georgette Dover, MD, [facility]  General/ Trauma Surgery  10/10/2016 7:59 AM

## 2016-10-10 NOTE — Transfer of Care (Signed)
Immediate Anesthesia Transfer of Care Note  Patient: MEDRITH VEILLON  Procedure(s) Performed: Procedure(s): CONTINUOUS AMBULATORY PERITONEAL DIALYSIS  (CAPD) CATHETER REMOVAL (N/A)  Patient Location: PACU  Anesthesia Type:General  Level of Consciousness: lethargic and responds to stimulation  Airway & Oxygen Therapy: Patient Spontanous Breathing  Post-op Assessment: Report given to RN and Post -op Vital signs reviewed and stable  Post vital signs: Reviewed and stable  Last Vitals:  Vitals:   10/09/16 2008 10/10/16 0506  BP: (!) 117/53 (!) 123/54  Pulse: (!) 107 (!) 102  Resp: 17 18  Temp: 37.1 C 36.7 C    Last Pain:  Vitals:   10/10/16 0506  TempSrc: Oral  PainSc:       Patients Stated Pain Goal: 0 (12/78/71 8367)  Complications: No apparent anesthesia complications

## 2016-10-10 NOTE — Anesthesia Preprocedure Evaluation (Addendum)
Anesthesia Evaluation  Patient identified by MRN, date of birth, ID band Patient awake    Reviewed: Allergy & Precautions, H&P , NPO status   History of Anesthesia Complications Negative for: history of anesthetic complications  Airway Mallampati: I  TM Distance: >3 FB Neck ROM: Full    Dental  (+) Dental Advisory Given, Poor Dentition,    Pulmonary shortness of breath, neg sleep apnea,    breath sounds clear to auscultation       Cardiovascular hypertension, + Peripheral Vascular Disease   Rhythm:regular Rate:Normal     Neuro/Psych  Headaches,  Neuromuscular disease    GI/Hepatic   Endo/Other  diabetesHypothyroidism   Renal/GU CRF, ESRF and DialysisRenal diseaseConverting from peritoneal to IV HD, HD MWF     Musculoskeletal  (+) Arthritis ,   Abdominal   Peds  Hematology   Anesthesia Other Findings   Reproductive/Obstetrics                           Anesthesia Physical Anesthesia Plan  ASA: III  Anesthesia Plan: General   Post-op Pain Management:    Induction: Intravenous  Airway Management Planned: LMA  Additional Equipment:   Intra-op Plan:   Post-operative Plan:   Informed Consent: I have reviewed the patients History and Physical, chart, labs and discussed the procedure including the risks, benefits and alternatives for the proposed anesthesia with the patient or authorized representative who has indicated his/her understanding and acceptance.     Plan Discussed with: CRNA, Anesthesiologist and Surgeon  Anesthesia Plan Comments:         Anesthesia Quick Evaluation

## 2016-10-10 NOTE — Op Note (Signed)
Pre-op diagnosis:  Infected peritoneal dialysis catheter with peritonitis Postop diagnosis: Same Procedure performed: Removal of peritoneal dialysis catheter Surgeon:Marlee Armenteros K. Anesthesia: Gen. via LMA Indications: This is a 66 show female on peritoneal dialysis who had a peritoneal dialysis catheter placed by a surgeon in [facility] in May 2017. She has had repeated episodes of peritonitis. We are now asked to remove her catheter.  Description of procedure: The patient is brought to the operating room and placed in a supine position on the operating room table. After adequate level general anesthesia was obtained we open the peritoneal catheter and drained approximately 500 mL of cloudy appearing yellowish fluid. Once the drainage had we prepped the entire abdomen with ChloraPrep and prepped the tubing with Betadine. We draped sterile fashion. A timeout was taken to ensure the proper patient and proper procedure. We infiltrated the area around the insertion site in the left upper quadrant with 0.25% Marcaine with epinephrine. We made a transverse incision around the insertion site. We dissected down and the subcutaneous tissues with cautery. We encountered the first felt cuff on the tubing. We loosen this up with cautery. We then dissected further until we identified the second cuff. This was also mobilized freely. We then palpated the length of the tubing down into the pelvis where it had been inserted in the suprapubic region. We anesthetized this area there was local and a cutdown over this area. I identified the tubing in this area and then we found the third felt cuff. We dissect this free and remove the tubing from the peritoneal cavity. The tubing was then cut and will remove the remainder the tubing from the left upper quadrant site. The fascia was closed at the pelvic insertion site with 0 Vicryl. Hemostasis was obtained with cautery. Both wounds were closed with 3-0 Vicryl and a subcuticular  layer of 4-0 Monocryl. Steri-Strips and clean dressings were applied. The patient was then extubated and brought to the recovery room in stable condition. All sponge, instrument, and needle counts are correct.  Imogene Burn. Georgette Dover, MD, [facility] Surgery  General/ Trauma Surgery  10/10/2016 11:11 AM

## 2016-10-10 NOTE — Progress Notes (Signed)
[name] Progress Note  Dialysis Orders: PD transitioning to HD   Assessment/Plan: 24F with recurrent purulent peritonitis, severe PCM, transition to HD 1. Peritonitis - recurrent peritonitis since starting PD in mid 2017 - Fluid cx + Klebsiella oxytoca BC NGTD- Now on IV Rocephin PD cath removed today  2. ESRD - Last HD 1/6 - cont on TTS schedule here d/t heavy MWF schedule  - pt agreeable to HD - start CLIP process  3. Hypokalemia -  4K bath with HD  4. Anemia - Hgb 9.8 trending down - will give ESA with next HD  5. Secondary hyperparathyroidism -  Cont  calcitriol with HD  6. HTN/volume - BP controlled/no BP meds / est new EDW with HD post HD wt on 1/6 88.4 kg  7. Nutrition - severe PCM - liberalize diet/ protein supp  8. DM - per primary  9. S/p gastric bypass    Lynnda Child PA-C [facility] Pager [phone] 10/10/2016,3:11 PM  LOS: 3 days   Pt seen, examined and agree w A/P as above.  Kelly Splinter MD [employer] pager [phone]   10/10/2016, 5:32 PM    Subjective: No new c/os. PD cath out today. Using RUE AVF for HD  Objective Vitals:   10/10/16 1130 10/10/16 1145 10/10/16 1200 10/10/16 1254  BP: 125/66 120/66  115/65  Pulse: 90 89  95  Resp: (!) 25 10  16   Temp:   97.6 F (36.4 C) 98.1 F (36.7 C)  TempSrc:    Oral  SpO2: 96% 97%  99%  Weight:      Height:       Physical Exam General: WNWD AAF NAD Heart: RRR Lungs: CTAB Abdomen: soft NT PD cath removed site with dressing Extremities: trace LE edema  Dialysis Access: RUE AVF +bruit   Additional Objective Labs: Basic Metabolic Panel:  Recent Labs Lab 10/07/16 1718 10/08/16 0220 10/09/16 1030  NA 135 138 137  K 3.3* 3.1* 3.3*  CL 97* 103 100*  CO2 26 26 30   GLUCOSE 267* 83 103*  BUN 21* 22* 15  CREATININE 8.08* 7.78* 5.46*  CALCIUM 8.0* 7.3* 7.3*  PHOS  --  2.9  --    Liver Function Tests:  Recent Labs Lab 10/07/16 1718  10/08/16 0220  AST 17 15  ALT 17 12*  ALKPHOS 118 88  BILITOT 0.6 0.3  PROT 4.9* 3.8*  ALBUMIN 1.4* 1.1*    Recent Labs Lab 10/07/16 1718  LIPASE <10*   CBC:  Recent Labs Lab 10/07/16 1718 10/08/16 0220 10/09/16 1030 10/10/16 0404  WBC 6.4 8.4 11.7* 11.7*  NEUTROABS 5.3  --   --   --   HGB 12.4 10.2* 9.7* 9.8*  HCT 37.3 31.4* 29.6* 29.8*  MCV 82.5 83.3 81.3 83.0  PLT 237 206 211 200   Blood Culture    Component Value Date/Time   SDES BLOOD LEFT ANTECUBITAL 10/07/2016 1725   SPECREQUEST  10/07/2016 1725    BOTTLES DRAWN AEROBIC AND ANAEROBIC BLUE 10CC RED 5CC   CULT NO GROWTH 2 DAYS 10/07/2016 1725   REPTSTATUS PENDING 10/07/2016 1725    Cardiac Enzymes: No results for input(s): CKTOTAL, CKMB, CKMBINDEX, TROPONINI in the last 168 hours. CBG:  Recent Labs Lab 10/10/16 0008 10/10/16 0427 10/10/16 0733 10/10/16 1105 10/10/16 1241  GLUCAP 122* 99 110* 99 88   Iron Studies: No results for input(s): IRON, TIBC, TRANSFERRIN, FERRITIN in the last 72 hours. Lab Results  Component  Value Date   INR 1.07 07/10/2014   INR 1.13 09/06/2012   INR 1.07 02/07/2012   Medications: . sodium chloride 10 mL/hr at 10/10/16 0916   . allopurinol  100 mg Oral QHS  . cefTRIAXone (ROCEPHIN)  IV  1 g Intravenous Q24H  . feeding supplement (ENSURE ENLIVE)  237 mL Oral BID BM  . guaiFENesin  600 mg Oral BID  . heparin subcutaneous  5,000 Units Subcutaneous Q8H  . HYDROmorphone      . insulin aspart  0-9 Units Subcutaneous Q4H  . insulin aspart  10 Units Subcutaneous TID WC  . levothyroxine  100 mcg Oral QAC breakfast  . pregabalin  75 mg Oral Daily  . sevelamer carbonate  2.4 g Oral TID WC  . sodium chloride flush  3 mL Intravenous Q12H

## 2016-10-10 NOTE — Progress Notes (Signed)
PROGRESS NOTE                                                                                                                                                                                                             Patient Demographics:    Wanda [name], is a 56 y.o. female, DOB - Jan 15, 1961, HYW:[id number]  Admit date - 10/07/2016   Admitting Physician Toy Baker, MD  Outpatient Primary MD for the patient is ROBERTS, Sharol Given, MD  LOS - 3  Chief Complaint  Patient presents with  . Abdominal Pain       Brief Narrative   History of ESRD on PD, gastric bypass surgery, hypothyroidism, chronic hypotension, DM type II was admitted to the hospital with abdominal pain was diagnosed with PD catheter-induced peritonitis. Placed on antibiotics, currently renal following and she is currently on HD.   Subjective:    Wanda [name] today has, No headache, No chest pain, mild abdominal pain - No Nausea, No new weakness tingling or numbness, No Cough - SOB.     Assessment  & Plan :     1.PD catheter-induced peritonitis.   PD catheter to be pulled on 10/09/2016 by general surgery, Chills noted transition to IV Rocephin, if stable discharge in the morning on oral Vantin for about 10 more days.  2. ESRD. Currently on HD. Renal following.  3. Chronic hypotension. Stable asymptomatic.  4. Hypothyroidism. Stable TSH continue home dose Synthroid.  5. DM type II. Continue present regimen and follow CBGs.  CBG (last 3)   Recent Labs  10/10/16 0008 10/10/16 0427 10/10/16 0733  GLUCAP 122* 99 110*      Family Communication  : Son bedside  Code Status :  Full  Diet : Diet NPO time specified Except for: Sips with Meds    Disposition Plan  :  Home in the morning  Consults  :  Renal  Procedures  :  None  DVT Prophylaxis  :  Heparin    Lab Results  Component Value Date   PLT 200 10/10/2016     Inpatient Medications  Scheduled Meds: . [MAR Hold] allopurinol  100 mg Oral QHS  . cefTRIAXone (ROCEPHIN)  IV  1 g Intravenous Q24H  . [MAR Hold] feeding supplement (ENSURE ENLIVE)  237 mL Oral BID BM  . [MAR Hold] guaiFENesin  600 mg Oral BID  . [MAR Hold] heparin subcutaneous  5,000 Units Subcutaneous Q8H  . [MAR Hold] insulin aspart  0-9 Units Subcutaneous Q4H  . [MAR Hold] insulin aspart  10 Units Subcutaneous TID WC  . [MAR Hold] levothyroxine  100 mcg Oral QAC breakfast  . [MAR Hold] pregabalin  75 mg Oral Daily  . [MAR Hold] sevelamer carbonate  2.4 g Oral TID WC  . [MAR Hold] sodium chloride flush  3 mL Intravenous Q12H   Continuous Infusions: . sodium chloride 10 mL/hr at 10/10/16 0916   PRN Meds:.[MAR Hold] acetaminophen, [MAR Hold] ondansetron **OR** [MAR Hold] ondansetron (ZOFRAN) IV, [MAR Hold] oxyCODONE-acetaminophen  Antibiotics  :    Anti-infectives    Start     Dose/Rate Route Frequency Ordered Stop   10/10/16 1030  cefTRIAXone (ROCEPHIN) 1 g in dextrose 5 % 50 mL IVPB     1 g 100 mL/hr over 30 Minutes Intravenous Every 24 hours 10/10/16 1016     10/08/16 1800  cefTAZidime (FORTAZ) 1 g in dextrose 5 % 50 mL IVPB  Status:  Discontinued     1 g 100 mL/hr over 30 Minutes Intravenous Every 24 hours 10/08/16 1055 10/10/16 1016   10/08/16 1430  vancomycin (VANCOCIN) IVPB 1000 mg/200 mL premix     1,000 mg 200 mL/hr over 60 Minutes Intravenous  Once 10/08/16 1417 10/08/16 1908         Objective:   Vitals:   10/09/16 0942 10/09/16 1732 10/09/16 2008 10/10/16 0506  BP: 96/71 113/69 (!) 117/53 (!) 123/54  Pulse: (!) 112 (!) 106 (!) 107 (!) 102  Resp: 17 16 17 18   Temp: 99.7 F (37.6 C) 98.9 F (37.2 C) 98.7 F (37.1 C) 98.1 F (36.7 C)  TempSrc: Oral Oral Oral Oral  SpO2: 98% 100% 99% 98%  Weight:   91.4 kg (201 lb 6.4 oz)   Height:        Wt Readings from Last 3 Encounters:  10/09/16 91.4 kg (201 lb 6.4 oz)  10/13/15 105.8 kg (233 lb 3.2 oz)   08/18/15 104.8 kg (231 lb)     Intake/Output Summary (Last 24 hours) at 10/10/16 1016 Last data filed at 10/10/16 0600  Gross per 24 hour  Intake              240 ml  Output                0 ml  Net              240 ml     Physical Exam  Awake Alert, Oriented X 3, No new F.N deficits, Normal affect [place].AT,PERRAL Supple Neck,No JVD, No cervical lymphadenopathy appriciated.  Symmetrical Chest wall movement, Good air movement bilaterally, CTAB RRR,No Gallops,Rubs or new Murmurs, No Parasternal Heave +ve B.Sounds, Abd Soft, Minimal generalized tenderness, PD catheter in place, No organomegaly appriciated, No rebound - guarding or rigidity. No Cyanosis, Clubbing or edema, No new Rash or bruise, right arm AV fistula    Data Review:    CBC  Recent Labs Lab 10/07/16 1718 10/08/16 0220 10/09/16 1030 10/10/16 0404  WBC 6.4 8.4 11.7* 11.7*  HGB 12.4 10.2* 9.7* 9.8*  HCT 37.3 31.4* 29.6* 29.8*  PLT 237 206 211 200  MCV 82.5 83.3 81.3 83.0  MCH 27.4 27.1 26.6 27.3  MCHC 33.2 32.5 32.8 32.9  RDW 14.7 15.0 14.7 15.4  LYMPHSABS 0.8  --   --   --   MONOABS  0.3  --   --   --   EOSABS 0.0  --   --   --   BASOSABS 0.0  --   --   --     Chemistries   Recent Labs Lab 10/07/16 1718 10/08/16 0220 10/09/16 1030  NA 135 138 137  K 3.3* 3.1* 3.3*  CL 97* 103 100*  CO2 26 26 30   GLUCOSE 267* 83 103*  BUN 21* 22* 15  CREATININE 8.08* 7.78* 5.46*  CALCIUM 8.0* 7.3* 7.3*  MG  --  1.4*  --   AST 17 15  --   ALT 17 12*  --   ALKPHOS 118 88  --   BILITOT 0.6 0.3  --    ------------------------------------------------------------------------------------------------------------------ No results for input(s): CHOL, HDL, LDLCALC, TRIG, CHOLHDL, LDLDIRECT in the last 72 hours.  Lab Results  Component Value Date   HGBA1C 5.4 10/08/2016   ------------------------------------------------------------------------------------------------------------------  Recent Labs   10/08/16 0220  TSH 2.904   ------------------------------------------------------------------------------------------------------------------ No results for input(s): VITAMINB12, FOLATE, FERRITIN, TIBC, IRON, RETICCTPCT in the last 72 hours.  Coagulation profile No results for input(s): INR, PROTIME in the last 168 hours.  No results for input(s): DDIMER in the last 72 hours.  Cardiac Enzymes No results for input(s): CKMB, TROPONINI, MYOGLOBIN in the last 168 hours.  Invalid input(s): CK ------------------------------------------------------------------------------------------------------------------ No results found for: BNP  Micro Results Recent Results (from the past 240 hour(s))  Gram stain     Status: None   Collection Time: 10/07/16  4:21 PM  Result Value Ref Range Status   Specimen Description FLUID PERITONEAL DIALYSATE  Final   Special Requests NONE  Final   Gram Stain   Final    ABUNDANT WBC PRESENT, PREDOMINANTLY PMN NO ORGANISMS SEEN    Report Status 10/07/2016 FINAL  Final  Culture, body fluid-bottle     Status: Abnormal   Collection Time: 10/07/16  4:21 PM  Result Value Ref Range Status   Specimen Description FLUID PERITONEAL DIALYSATE  Final   Special Requests BOTTLES DRAWN AEROBIC AND ANAEROBIC 10C  Final   Gram Stain   Final    GRAM NEGATIVE RODS IN BOTH AEROBIC AND ANAEROBIC BOTTLES CRITICAL RESULT CALLED TO, READ BACK BY AND VERIFIED WITH: J WOODY,RN @0523  10/08/16 MKELLY,MLT    Culture KLEBSIELLA OXYTOCA NO ANAEROBES ISOLATED  (A)  Final   Report Status 10/10/2016 FINAL  Final   Organism ID, Bacteria KLEBSIELLA OXYTOCA  Final      Susceptibility   Klebsiella oxytoca - MIC*    AMPICILLIN 16 RESISTANT Resistant     CEFAZOLIN <=4 SENSITIVE Sensitive     CEFEPIME <=1 SENSITIVE Sensitive     CEFTAZIDIME <=1 SENSITIVE Sensitive     CEFTRIAXONE <=1 SENSITIVE Sensitive     CIPROFLOXACIN <=0.25 SENSITIVE Sensitive     GENTAMICIN <=1 SENSITIVE Sensitive      IMIPENEM <=0.25 SENSITIVE Sensitive     TRIMETH/SULFA <=20 SENSITIVE Sensitive     AMPICILLIN/SULBACTAM <=2 SENSITIVE Sensitive     PIP/TAZO <=4 SENSITIVE Sensitive     Extended ESBL NEGATIVE Sensitive     * KLEBSIELLA OXYTOCA  Blood culture (routine x 2)     Status: None (Preliminary result)   Collection Time: 10/07/16  5:20 PM  Result Value Ref Range Status   Specimen Description BLOOD LEFT HAND  Final   Special Requests IN PEDIATRIC BOTTLE 4CC  Final   Culture NO GROWTH 2 DAYS  Final   Report Status PENDING  Incomplete  Blood culture (routine x 2)     Status: None (Preliminary result)   Collection Time: 10/07/16  5:25 PM  Result Value Ref Range Status   Specimen Description BLOOD LEFT ANTECUBITAL  Final   Special Requests   Final    BOTTLES DRAWN AEROBIC AND ANAEROBIC BLUE 10CC RED 5CC   Culture NO GROWTH 2 DAYS  Final   Report Status PENDING  Incomplete  MRSA PCR Screening     Status: None   Collection Time: 10/09/16  3:48 AM  Result Value Ref Range Status   MRSA by PCR NEGATIVE NEGATIVE Final    Comment:        The GeneXpert MRSA Assay (FDA approved for NASAL specimens only), is one component of a comprehensive MRSA colonization surveillance program. It is not intended to diagnose MRSA infection nor to guide or monitor treatment for MRSA infections.     Radiology Reports Dg Chest 2 View  Result Date: 10/07/2016 CLINICAL DATA:  Cough x2 weeks EXAM: CHEST  2 VIEW COMPARISON:  08/20/2014 FINDINGS: The heart size and mediastinal contours are within normal limits. Both lungs are clear. The visualized skeletal structures are unremarkable. IMPRESSION: No active cardiopulmonary disease. Electronically Signed   By: Ashley Royalty M.D.   On: 10/07/2016 18:16    Time Spent in minutes  30   Ahmar Pickrell K M.D on 10/10/2016 at 10:16 AM  Between 7am to 7pm - Pager - [phone]  After 7pm go to [email or web address] - password [facility]  [facility] -  Office   [phone]

## 2016-10-11 ENCOUNTER — Encounter (HOSPITAL_COMMUNITY): Payer: Self-pay | Admitting: Surgery

## 2016-10-11 LAB — GLUCOSE, CAPILLARY
Glucose-Capillary: 107 mg/dL — ABNORMAL HIGH (ref 65–99)
Glucose-Capillary: 123 mg/dL — ABNORMAL HIGH (ref 65–99)
Glucose-Capillary: 143 mg/dL — ABNORMAL HIGH (ref 65–99)
Glucose-Capillary: 62 mg/dL — ABNORMAL LOW (ref 65–99)
Glucose-Capillary: 64 mg/dL — ABNORMAL LOW (ref 65–99)
Glucose-Capillary: 93 mg/dL (ref 65–99)

## 2016-10-11 LAB — RENAL FUNCTION PANEL
Albumin: <1 g/dL — ABNORMAL LOW (ref 3.5–5.0)
Anion gap: 9 (ref 5–15)
BUN: 27 mg/dL — ABNORMAL HIGH (ref 6–20)
CO2: 28 mmol/L (ref 22–32)
Calcium: 7.6 mg/dL — ABNORMAL LOW (ref 8.9–10.3)
Chloride: 99 mmol/L — ABNORMAL LOW (ref 101–111)
Creatinine, Ser: 6.98 mg/dL — ABNORMAL HIGH (ref 0.44–1.00)
GFR calc Af Amer: 7 mL/min — ABNORMAL LOW (ref >60)
GFR calc non Af Amer: 6 mL/min — ABNORMAL LOW (ref >60)
Glucose, Bld: 95 mg/dL (ref 65–99)
Phosphorus: 3.1 mg/dL (ref 2.5–4.6)
Potassium: 3.4 mmol/L — ABNORMAL LOW (ref 3.5–5.1)
Sodium: 136 mmol/L (ref 135–145)

## 2016-10-11 LAB — CBC
HCT: 28.2 % — ABNORMAL LOW (ref 36.0–46.0)
Hemoglobin: 9.2 g/dL — ABNORMAL LOW (ref 12.0–15.0)
MCH: 26.5 pg (ref 26.0–34.0)
MCHC: 32.6 g/dL (ref 30.0–36.0)
MCV: 81.3 fL (ref 78.0–100.0)
Platelets: 215 10*3/uL (ref 150–400)
RBC: 3.47 MIL/uL — ABNORMAL LOW (ref 3.87–5.11)
RDW: 15.2 % (ref 11.5–15.5)
WBC: 12.7 10*3/uL — ABNORMAL HIGH (ref 4.0–10.5)

## 2016-10-11 MED ORDER — DARBEPOETIN ALFA 60 MCG/0.3ML IJ SOSY
PREFILLED_SYRINGE | INTRAMUSCULAR | Status: AC
  Administered 2016-10-11: 60 ug via INTRAVENOUS
  Filled 2016-10-11: qty 0.3

## 2016-10-11 MED ORDER — HEPARIN SODIUM (PORCINE) 1000 UNIT/ML DIALYSIS: 1000.0000 [IU] | INTRAMUSCULAR | Status: DC | PRN

## 2016-10-11 MED ORDER — LIDOCAINE-PRILOCAINE 2.5-2.5 % EX CREA
1.0000 | TOPICAL_CREAM | CUTANEOUS | Status: DC | PRN
Start: 2016-10-11 — End: 2016-10-12

## 2016-10-11 MED ORDER — LIDOCAINE HCL (PF) 1 % IJ SOLN: 5.0000 mL | INTRAMUSCULAR | Status: DC | PRN

## 2016-10-11 MED ORDER — HEPARIN SODIUM (PORCINE) 1000 UNIT/ML DIALYSIS: 20.0000 [IU]/kg | INTRAMUSCULAR | Status: DC | PRN

## 2016-10-11 MED ORDER — PENTAFLUOROPROP-TETRAFLUOROETH EX AERO: 1.0000 "application " | INHALATION_SPRAY | CUTANEOUS | Status: DC | PRN

## 2016-10-11 MED ORDER — SODIUM CHLORIDE 0.9 % IV SOLN: 100.0000 mL | INTRAVENOUS | Status: DC | PRN

## 2016-10-11 MED ORDER — CEFPODOXIME PROXETIL 200 MG PO TABS: 200.0000 mg | ORAL_TABLET | Freq: Two times a day (BID) | ORAL | 0 refills | Status: DC

## 2016-10-11 NOTE — Care Management Important Message (Signed)
Important Message  Patient Details  Name: WILL SCHIER MRN: [id number] Date of Birth: [birth year]   Medicare Important Message Given:  Yes    Abiha Lukehart 10/11/2016, 11:08 AM

## 2016-10-11 NOTE — Discharge Instructions (Signed)
Follow with Primary MD ROBERTS, Sharol Given, MD in 7 days   Get CBC, CMP, 2 view Chest X ray checked  by Primary MD or SNF MD in 5-7 days ( we routinely change or add medications that can affect your baseline labs and fluid status, therefore we recommend that you get the mentioned basic workup next visit with your PCP, your PCP may decide not to get them or add new tests based on their clinical decision)   Activity: As tolerated with Full fall precautions use walker/cane & assistance as needed   Disposition Home    Diet:   Diet renal/carb modified with 1.5lit/day fluid restriction  For Heart failure patients - Check your Weight same time everyday, if you gain over 2 pounds, or you develop in leg swelling, experience more shortness of breath or chest pain, call your Primary MD immediately. Follow Cardiac Low Salt Diet and 1.5 lit/day fluid restriction.   On your next visit with your primary care physician please Get Medicines reviewed and adjusted.   Please request your Prim.MD to go over all Hospital Tests and Procedure/Radiological results at the follow up, please get all Hospital records sent to your Prim MD by signing hospital release before you go home.   If you experience worsening of your admission symptoms, develop shortness of breath, life threatening emergency, suicidal or homicidal thoughts you must seek medical attention immediately by calling 911 or calling your MD immediately  if symptoms less severe.  You Must read complete instructions/literature along with all the possible adverse reactions/side effects for all the Medicines you take and that have been prescribed to you. Take any new Medicines after you have completely understood and accpet all the possible adverse reactions/side effects.   Do not drive, operate heavy machinery, perform activities at heights, swimming or participation in water activities or provide baby sitting services if your were admitted for syncope or  siezures until you have seen by Primary MD or a Neurologist and advised to do so again.  Do not drive when taking Pain medications.    Do not take more than prescribed Pain, Sleep and Anxiety Medications  Special Instructions: If you have smoked or chewed Tobacco  in the last 2 yrs please stop smoking, stop any regular Alcohol  and or any Recreational drug use.  Wear Seat belts while driving.   Please note  You were cared for by a hospitalist during your hospital stay. If you have any questions about your discharge medications or the care you received while you were in the hospital after you are discharged, you can call the unit and asked to speak with the hospitalist on call if the hospitalist that took care of you is not available. Once you are discharged, your primary care physician will handle any further medical issues. Please note that NO REFILLS for any discharge medications will be authorized once you are discharged, as it is imperative that you return to your primary care physician (or establish a relationship with a primary care physician if you do not have one) for your aftercare needs so that they can reassess your need for medications and monitor your lab values.

## 2016-10-11 NOTE — Progress Notes (Signed)
1 Day Post-Op  Subjective: On HD, no issue with PD removal, sites are fine.    Objective: Vital signs in last 24 hours: Temp:  [97.6 F (36.4 C)-99.2 F (37.3 C)] 97.9 F (36.6 C) (01/09 0400) Pulse Rate:  [89-112] 98 (01/09 0830) Resp:  [7-25] 15 (01/09 0830) BP: (103-133)/(58-83) 121/65 (01/09 0830) SpO2:  [95 %-99 %] 99 % (01/09 0400) Last BM Date: 10/07/16 760 PO Afebrile, VSS No labs Intake/Output from previous day: 01/08 0701 - 01/09 0700 In: 1413 [P.O.:760; I.V.:603; IV Piggyback:50] Out: 20 [Blood:20] Intake/Output this shift: No intake/output data recorded.  General appearance: alert, cooperative and no distress GI: sites look fine.    Lab Results:   Recent Labs  10/09/16 1030 10/10/16 0404  WBC 11.7* 11.7*  HGB 9.7* 9.8*  HCT 29.6* 29.8*  PLT 211 200    BMET  Recent Labs  10/09/16 1030  NA 137  K 3.3*  CL 100*  CO2 30  GLUCOSE 103*  BUN 15  CREATININE 5.46*  CALCIUM 7.3*   PT/INR No results for input(s): LABPROT, INR in the last 72 hours.   Recent Labs Lab 10/07/16 1718 10/08/16 0220  AST 17 15  ALT 17 12*  ALKPHOS 118 88  BILITOT 0.6 0.3  PROT 4.9* 3.8*  ALBUMIN 1.4* 1.1*     Lipase     Component Value Date/Time   LIPASE <10 (L) 10/07/2016 1718     Studies/Results: No results found.  Medications: . allopurinol  100 mg Oral QHS  . calcitRIOL  0.25 mcg Oral Q T,Th,Sa-HD  . cefTRIAXone (ROCEPHIN)  IV  1 g Intravenous Q24H  . darbepoetin (ARANESP) injection - DIALYSIS  60 mcg Intravenous Q Tue-HD  . feeding supplement (ENSURE ENLIVE)  237 mL Oral BID BM  . guaiFENesin  600 mg Oral BID  . heparin subcutaneous  5,000 Units Subcutaneous Q8H  . insulin aspart  0-9 Units Subcutaneous Q4H  . insulin aspart  10 Units Subcutaneous TID WC  . levothyroxine  100 mcg Oral QAC breakfast  . pregabalin  75 mg Oral Daily  . sevelamer carbonate  2.4 g Oral TID WC  . sodium chloride flush  3 mL Intravenous Q12H     Assessment/Plan Infected peritoneal dialysis catheter with peritonitis S/p Removal of peritoneal dialysis catheter, 10/10/16, Dr. Donnie Mesa ESRD Chronic hypotension Hypothyroid DM type II FEN:  Renal/Carb Mod ID: day 4 abx,; day 2 Ceftriaxone DVT:  heparin   Plan:  You can remove dressing in 48 hours post op. Clean sites with soap and water.  If Steri strips do not come off with washing you can remove in 7 days post op.  Call if we can be of further assistance.     LOS: 4 days    Wanda [name] 10/11/2016 [phone]

## 2016-10-11 NOTE — Progress Notes (Signed)
[facility] Progress Note  Dialysis Orders: PD transitioning to HD   Assessment: 8F with recurrent purulent peritonitis, severe PCM, transition to HD 1. Peritonitis - recurrent peritonitis since starting PD in mid 2017 - Fluid cx + Klebsiella oxytoca BC NGTD- Now on IV Rocephin. Abd pain resolved. See gen surg notes for post-op instructions.  2. ESRD - Last HD 1/6 - HD today.  Awaiting Hep B results, plan for CLIP to [facility].  3. Hypokalemia -  4K bath with HD  4. Anemia - Hgb 9.8 trending down - will give ESA with next HD  5. Secondary hyperparathyroidism -  Cont  calcitriol with HD  6. HTN/volume - BP controlled/no BP meds / est new EDW with HD post HD wt on 1/6 88.4 kg  7. Nutrition - severe PCM - liberalize diet/ protein supp  8. DM - per primary  9. S/p gastric bypass   Plan - HD today, await hep B and CLIP, possibly will be back today.  Clinically ok for dc.   Kelly Splinter MD [facility] pager [phone]   10/11/2016, 9:14 AM    Subjective: No new c/os. PD cath out today. Using RUE AVF for HD  Objective Vitals:   10/11/16 0758 10/11/16 0802 10/11/16 0830 10/11/16 0900  BP: (!) 143/63 (!) 142/71 121/65 (!) 144/76  Pulse: 98 98 98 (!) 101  Resp: 11 10 15 17   Temp: 98.2 F (36.8 C)     TempSrc: Oral     SpO2:      Weight: 89.6 kg (197 lb 8.5 oz)     Height:       Physical Exam General: WNWD AAF NAD Heart: RRR Lungs: CTAB Abdomen: soft NT PD cath removed site with dressing Extremities: trace LE edema  Dialysis Access: RUE AVF +bruit   Additional Objective Labs: Basic Metabolic Panel:  Recent Labs Lab 10/07/16 1718 10/08/16 0220 10/09/16 1030  NA 135 138 137  K 3.3* 3.1* 3.3*  CL 97* 103 100*  CO2 26 26 30   GLUCOSE 267* 83 103*  BUN 21* 22* 15  CREATININE 8.08* 7.78* 5.46*  CALCIUM 8.0* 7.3* 7.3*  PHOS  --  2.9  --    Liver Function Tests:  Recent Labs Lab 10/07/16 1718 10/08/16 0220  AST 17 15  ALT 17 12*   ALKPHOS 118 88  BILITOT 0.6 0.3  PROT 4.9* 3.8*  ALBUMIN 1.4* 1.1*    Recent Labs Lab 10/07/16 1718  LIPASE <10*   CBC:  Recent Labs Lab 10/07/16 1718 10/08/16 0220 10/09/16 1030 10/10/16 0404 10/11/16 0500  WBC 6.4 8.4 11.7* 11.7* 12.7*  NEUTROABS 5.3  --   --   --   --   HGB 12.4 10.2* 9.7* 9.8* 9.2*  HCT 37.3 31.4* 29.6* 29.8* 28.2*  MCV 82.5 83.3 81.3 83.0 81.3  PLT 237 206 211 200 215   Blood Culture    Component Value Date/Time   SDES BLOOD LEFT ANTECUBITAL 10/07/2016 1725   SPECREQUEST  10/07/2016 1725    BOTTLES DRAWN AEROBIC AND ANAEROBIC BLUE 10CC RED 5CC   CULT NO GROWTH 3 DAYS 10/07/2016 1725   REPTSTATUS PENDING 10/07/2016 1725    Cardiac Enzymes: No results for input(s): CKTOTAL, CKMB, CKMBINDEX, TROPONINI in the last 168 hours. CBG:  Recent Labs Lab 10/10/16 1241 10/10/16 1620 10/10/16 1928 10/10/16 2357 10/11/16 0358  GLUCAP 88 115* 143* 132* 107*   Iron Studies: No results for input(s): IRON, TIBC, TRANSFERRIN, FERRITIN in the  last 72 hours. Lab Results  Component Value Date   INR 1.07 07/10/2014   INR 1.13 09/06/2012   INR 1.07 02/07/2012   Medications: . sodium chloride 10 mL/hr at 10/10/16 0916   . allopurinol  100 mg Oral QHS  . calcitRIOL  0.25 mcg Oral Q T,Th,Sa-HD  . cefTRIAXone (ROCEPHIN)  IV  1 g Intravenous Q24H  . darbepoetin (ARANESP) injection - DIALYSIS  60 mcg Intravenous Q Tue-HD  . feeding supplement (ENSURE ENLIVE)  237 mL Oral BID BM  . guaiFENesin  600 mg Oral BID  . heparin subcutaneous  5,000 Units Subcutaneous Q8H  . insulin aspart  0-9 Units Subcutaneous Q4H  . insulin aspart  10 Units Subcutaneous TID WC  . levothyroxine  100 mcg Oral QAC breakfast  . pregabalin  75 mg Oral Daily  . sevelamer carbonate  2.4 g Oral TID WC  . sodium chloride flush  3 mL Intravenous Q12H

## 2016-10-11 NOTE — Anesthesia Postprocedure Evaluation (Addendum)
Anesthesia Post Note  Patient: Wanda [name]  Procedure(s) Performed: Procedure(s) (LRB): CONTINUOUS AMBULATORY PERITONEAL DIALYSIS  (CAPD) CATHETER REMOVAL (N/A)  Patient location during evaluation: PACU Anesthesia Type: General Level of consciousness: awake and alert and patient cooperative Pain management: pain level controlled Vital Signs Assessment: post-procedure vital signs reviewed and stable Respiratory status: spontaneous breathing and respiratory function stable Cardiovascular status: stable Anesthetic complications: no       Last Vitals:  Vitals:   10/10/16 2239 10/11/16 0400  BP:  124/60  Pulse:  94  Resp:  18  Temp: 36.8 C 36.6 C    Last Pain:  Vitals:   10/11/16 0400  TempSrc: Oral  PainSc:                  [place] S

## 2016-10-11 NOTE — Discharge Summary (Signed)
Wanda [name] BDZ:[id number] DOB: [birth year] DOA: 10/07/2016  PCP: Myriam Jacobson, MD  Admit date: 10/07/2016  Discharge date: 10/11/2016  Admitted From: Home  Disposition:  Home   Recommendations for Outpatient Follow-up:   Follow up with PCP in 1-2 weeks  PCP Please obtain BMP/CBC, 2 view CXR in 1week,  (see Discharge instructions)   PCP Please follow up on the following pending results: None   Home Health: None   Equipment/Devices: None  Consultations: Renal, CCS Discharge Condition: Stable   CODE STATUS: Full   Diet Recommendation: Diet renal/carb modified with 1.5 L fluid restriction per day  Chief Complaint  Patient presents with  . Abdominal Pain     Brief history of present illness from the day of admission and additional interim summary    History of ESRD on PD, gastric bypass surgery, hypothyroidism, chronic hypotension, DM type II was admitted to the hospital with abdominal pain was diagnosed with PD catheter-induced peritonitis. Placed on antibiotics, currently renal following and she is currently on HD.  Hospital issues addressed     1.PD catheter-induced peritonitis.   PD catheter Was pulled pulled on 10/09/2016 by general surgery, cultures estimated it was noted, she was treated with empiric IV antibiotics while she was here, clinically much improved and infection almost completely resolved, she will get 7 more days of oral Vantin. Outpatient follow-up with PCP in 7-10 days. She is completely symptom free at this point.    2. ESRD. Currently on HD. Renal followed the patient, she is cleared for discharge and outpatient dialysis has been managed by nephrology service.  3. Chronic hypotension. Stable asymptomatic.  4. Hypothyroidism. Stable TSH continue home dose Synthroid.  5.  DM type II. Continue home regimen, PCP to follow outpatient CBG control.   Discharge diagnosis     Active Problems:   DM (diabetes mellitus), type 2 with renal complications (HCC)   CHRONIC KIDNEY DISEASE STAGE IV (SEVERE)   HTN (hypertension)   Hypokalemia   Peritonitis (HCC)   SBP (spontaneous bacterial peritonitis) [facility])    Discharge instructions    Discharge Instructions    Discharge instructions    Complete by:  As directed    Follow with Primary MD Myriam Jacobson, MD in 7 days   Get CBC, CMP, 2 view Chest X ray checked  by Primary MD or SNF MD in 5-7 days ( we routinely change or add medications that can affect your baseline labs and fluid status, therefore we recommend that you get the mentioned basic workup next visit with your PCP, your PCP may decide not to get them or add new tests based on their clinical decision)   Activity: As tolerated with Full fall precautions use walker/cane & assistance as needed   Disposition Home    Diet:   Diet renal/carb modified with 1.5lit/day fluid restriction  For Heart failure patients - Check your Weight same time everyday, if you gain over 2 pounds, or you develop in leg swelling, experience more shortness of breath  or chest pain, call your Primary MD immediately. Follow Cardiac Low Salt Diet and 1.5 lit/day fluid restriction.   On your next visit with your primary care physician please Get Medicines reviewed and adjusted.   Please request your Prim.MD to go over all Hospital Tests and Procedure/Radiological results at the follow up, please get all Hospital records sent to your Prim MD by signing hospital release before you go home.   If you experience worsening of your admission symptoms, develop shortness of breath, life threatening emergency, suicidal or homicidal thoughts you must seek medical attention immediately by calling 911 or calling your MD immediately  if symptoms less severe.  You Must read complete  instructions/literature along with all the possible adverse reactions/side effects for all the Medicines you take and that have been prescribed to you. Take any new Medicines after you have completely understood and accpet all the possible adverse reactions/side effects.   Do not drive, operate heavy machinery, perform activities at heights, swimming or participation in water activities or provide baby sitting services if your were admitted for syncope or siezures until you have seen by Primary MD or a Neurologist and advised to do so again.  Do not drive when taking Pain medications.    Do not take more than prescribed Pain, Sleep and Anxiety Medications  Special Instructions: If you have smoked or chewed Tobacco  in the last 2 yrs please stop smoking, stop any regular Alcohol  and or any Recreational drug use.  Wear Seat belts while driving.   Please note  You were cared for by a hospitalist during your hospital stay. If you have any questions about your discharge medications or the care you received while you were in the hospital after you are discharged, you can call the unit and asked to speak with the hospitalist on call if the hospitalist that took care of you is not available. Once you are discharged, your primary care physician will handle any further medical issues. Please note that NO REFILLS for any discharge medications will be authorized once you are discharged, as it is imperative that you return to your primary care physician (or establish a relationship with a primary care physician if you do not have one) for your aftercare needs so that they can reassess your need for medications and monitor your lab values.   Increase activity slowly    Complete by:  As directed       Discharge Medications   Allergies as of 10/11/2016      Reactions   Hydrocodone-acetaminophen Other (See Comments)   Severe headache   Propoxyphene N-acetaminophen Other (See Comments)   Severe headache    Cephalosporins Rash      Medication List    TAKE these medications   ADVIL 200 MG tablet Generic drug:  ibuprofen Take 400 mg by mouth every 6 (six) hours as needed for headache (pain).   allopurinol 100 MG tablet Commonly known as:  ZYLOPRIM Take 100 mg by mouth at bedtime.   cefpodoxime 200 MG tablet Commonly known as:  VANTIN Take 1 tablet (200 mg total) by mouth 2 (two) times daily.   colchicine 0.6 MG tablet Take 0.6 mg by mouth daily as needed (gout attack).   ergocalciferol 50000 units capsule Commonly known as:  VITAMIN D2 Take 50,000 Units by mouth daily.   hydrOXYzine 25 MG tablet Commonly known as:  ATARAX/VISTARIL Take 25 mg by mouth 4 (four) times daily as needed for itching.   insulin  aspart 100 UNIT/ML FlexPen Commonly known as:  NOVOLOG FLEXPEN Inject 10 Units into the skin 3 (three) times daily with meals.   levothyroxine 100 MCG tablet Commonly known as:  SYNTHROID, LEVOTHROID Take 100 mcg by mouth daily.   multivitamin with minerals Tabs tablet Take 1 tablet by mouth daily.   ondansetron 4 MG disintegrating tablet Commonly known as:  ZOFRAN ODT Take 1 tablet (4 mg total) by mouth every 8 (eight) hours as needed for nausea or vomiting.   oxyCODONE-acetaminophen 5-325 MG tablet Commonly known as:  PERCOCET/ROXICET Take 1-2 tablets by mouth every 6 (six) hours as needed for severe pain.   potassium chloride SA 20 MEQ tablet Commonly known as:  K-DUR,KLOR-CON Take 40 mEq by mouth 2 (two) times daily.   pregabalin 75 MG capsule Commonly known as:  LYRICA Take 75 mg by mouth 2 (two) times daily.   promethazine 25 MG suppository Commonly known as:  PHENERGAN Place 1 suppository (25 mg total) rectally every 6 (six) hours as needed for nausea or vomiting. What changed:  Another medication with the same name was removed. Continue taking this medication, and follow the directions you see here.   RENVELA 2.4 g Pack Generic drug:  sevelamer  carbonate Take 2.4 g by mouth See admin instructions. Sprinkle 1 packet (2.4 g) with every meal and with snacks       Follow-up Information    ROBERTS, Sharol Given, MD. Schedule an appointment as soon as possible for a visit in 1 week(s).   Specialty:  Internal Medicine Contact information: [address], [place] [address] [phone]           Major procedures and Radiology Reports - PLEASE review detailed and final reports thoroughly  -         Dg Chest 2 View  Result Date: 10/07/2016 CLINICAL DATA:  Cough x2 weeks EXAM: CHEST  2 VIEW COMPARISON:  08/20/2014 FINDINGS: The heart size and mediastinal contours are within normal limits. Both lungs are clear. The visualized skeletal structures are unremarkable. IMPRESSION: No active cardiopulmonary disease. Electronically Signed   By: Ashley Royalty M.D.   On: 10/07/2016 18:16    Micro Results     Recent Results (from the past 240 hour(s))  Gram stain     Status: None   Collection Time: 10/07/16  4:21 PM  Result Value Ref Range Status   Specimen Description FLUID PERITONEAL DIALYSATE  Final   Special Requests NONE  Final   Gram Stain   Final    ABUNDANT WBC PRESENT, PREDOMINANTLY PMN NO ORGANISMS SEEN    Report Status 10/07/2016 FINAL  Final  Culture, body fluid-bottle     Status: Abnormal   Collection Time: 10/07/16  4:21 PM  Result Value Ref Range Status   Specimen Description FLUID PERITONEAL DIALYSATE  Final   Special Requests BOTTLES DRAWN AEROBIC AND ANAEROBIC 10C  Final   Gram Stain   Final    GRAM NEGATIVE RODS IN BOTH AEROBIC AND ANAEROBIC BOTTLES CRITICAL RESULT CALLED TO, READ BACK BY AND VERIFIED WITH: J WOODY,RN @0523  10/08/16 MKELLY,MLT    Culture KLEBSIELLA OXYTOCA NO ANAEROBES ISOLATED  (A)  Final   Report Status 10/10/2016 FINAL  Final   Organism ID, Bacteria KLEBSIELLA OXYTOCA  Final      Susceptibility   Klebsiella oxytoca - MIC*    AMPICILLIN 16 RESISTANT Resistant     CEFAZOLIN <=4  SENSITIVE Sensitive     CEFEPIME <=1 SENSITIVE Sensitive  CEFTAZIDIME <=1 SENSITIVE Sensitive     CEFTRIAXONE <=1 SENSITIVE Sensitive     CIPROFLOXACIN <=0.25 SENSITIVE Sensitive     GENTAMICIN <=1 SENSITIVE Sensitive     IMIPENEM <=0.25 SENSITIVE Sensitive     TRIMETH/SULFA <=20 SENSITIVE Sensitive     AMPICILLIN/SULBACTAM <=2 SENSITIVE Sensitive     PIP/TAZO <=4 SENSITIVE Sensitive     Extended ESBL NEGATIVE Sensitive     * KLEBSIELLA OXYTOCA  Blood culture (routine x 2)     Status: None (Preliminary result)   Collection Time: 10/07/16  5:20 PM  Result Value Ref Range Status   Specimen Description BLOOD LEFT HAND  Final   Special Requests IN PEDIATRIC BOTTLE 4CC  Final   Culture NO GROWTH 3 DAYS  Final   Report Status PENDING  Incomplete  Blood culture (routine x 2)     Status: None (Preliminary result)   Collection Time: 10/07/16  5:25 PM  Result Value Ref Range Status   Specimen Description BLOOD LEFT ANTECUBITAL  Final   Special Requests   Final    BOTTLES DRAWN AEROBIC AND ANAEROBIC BLUE 10CC RED 5CC   Culture NO GROWTH 3 DAYS  Final   Report Status PENDING  Incomplete  MRSA PCR Screening     Status: None   Collection Time: 10/09/16  3:48 AM  Result Value Ref Range Status   MRSA by PCR NEGATIVE NEGATIVE Final    Comment:        The GeneXpert MRSA Assay (FDA approved for NASAL specimens only), is one component of a comprehensive MRSA colonization surveillance program. It is not intended to diagnose MRSA infection nor to guide or monitor treatment for MRSA infections.     Today   Subjective    Wanda [name] today has no headache,no chest abdominal pain,no new weakness tingling or numbness, feels much better wants to go home today.   Objective   Blood pressure (!) 116/53, pulse (!) 102, temperature 98.2 F (36.8 C), temperature source Oral, resp. rate 18, height 5\' 9"  (1.753 m), weight 89.6 kg (197 lb 8.5 oz), SpO2 99 %.   Intake/Output Summary (Last 24  hours) at 10/11/16 1119 Last data filed at 10/11/16 0930  Gross per 24 hour  Intake              813 ml  Output                0 ml  Net              813 ml    Exam Awake Alert, Oriented x 3, No new F.N deficits, Normal affect [place].AT,PERRAL Supple Neck,No JVD, No cervical lymphadenopathy appriciated.  Symmetrical Chest wall movement, Good air movement bilaterally, CTAB RRR,No Gallops,Rubs or new Murmurs, No Parasternal Heave +ve B.Sounds, Abd Soft, Minimal epigastric tenderness, No organomegaly appriciated, No rebound -guarding or rigidity. No Cyanosis, Clubbing or edema, No new Rash or bruise   Data Review   CBC w Diff: Lab Results  Component Value Date   WBC 12.7 (H) 10/11/2016   HGB 9.2 (L) 10/11/2016   HGB 10.5 (L) 08/03/2010   HCT 28.2 (L) 10/11/2016   HCT 30.7 (L) 08/03/2010   PLT 215 10/11/2016   PLT 277 08/03/2010   LYMPHOPCT 12 10/07/2016   LYMPHOPCT 19.0 08/03/2010   MONOPCT 5 10/07/2016   MONOPCT 3.4 08/03/2010   EOSPCT 0 10/07/2016   EOSPCT 1.7 08/03/2010   BASOPCT 0 10/07/2016   BASOPCT 0.4 08/03/2010  CMP: Lab Results  Component Value Date   NA 136 10/11/2016   K 3.4 (L) 10/11/2016   CL 99 (L) 10/11/2016   CO2 28 10/11/2016   BUN 27 (H) 10/11/2016   CREATININE 6.98 (H) 10/11/2016   CREATININE 8.80 (H) 07/10/2014   PROT 3.8 (L) 10/08/2016   PROT 7.3 10/13/2015   ALBUMIN <1.0 (L) 10/11/2016   BILITOT 0.3 10/08/2016   ALKPHOS 88 10/08/2016   AST 15 10/08/2016   ALT 12 (L) 10/08/2016  .   Total Time in preparing paper work, data evaluation and todays exam - 35 minutes  Thurnell Lose M.D on 10/11/2016 at 11:19 AM  [facility]   Office  [phone]

## 2016-10-12 LAB — HEPATITIS B SURFACE ANTIBODY,QUALITATIVE: Hep B S Ab: REACTIVE

## 2016-10-12 LAB — CULTURE, BLOOD (ROUTINE X 2)
Culture: NO GROWTH
Culture: NO GROWTH

## 2016-10-12 LAB — HEPATITIS B SURFACE ANTIGEN: Hepatitis B Surface Ag: NEGATIVE

## 2016-10-12 LAB — HEPATITIS B CORE ANTIBODY, TOTAL: Hep B Core Total Ab: NEGATIVE

## 2016-10-27 ENCOUNTER — Encounter: Payer: Self-pay | Admitting: Gastroenterology

## 2016-10-27 ENCOUNTER — Encounter: Payer: Self-pay | Admitting: Physician Assistant

## 2016-10-31 ENCOUNTER — Ambulatory Visit: Payer: Self-pay | Admitting: Physician Assistant

## 2016-11-03 ENCOUNTER — Encounter (INDEPENDENT_AMBULATORY_CARE_PROVIDER_SITE_OTHER): Payer: Self-pay | Admitting: Gastroenterology

## 2016-11-03 ENCOUNTER — Encounter: Payer: Self-pay | Admitting: Gastroenterology

## 2016-11-03 NOTE — Progress Notes (Signed)
This encounter was created in error - please disregard.

## 2016-11-29 ENCOUNTER — Other Ambulatory Visit (HOSPITAL_COMMUNITY): Payer: Self-pay | Admitting: Gastroenterology

## 2016-11-29 DIAGNOSIS — R112 Nausea with vomiting, unspecified: Secondary | ICD-10-CM

## 2016-12-06 ENCOUNTER — Encounter (HOSPITAL_COMMUNITY): Payer: Medicare HMO | Attending: Gastroenterology

## 2016-12-27 ENCOUNTER — Encounter (HOSPITAL_COMMUNITY): Payer: Self-pay

## 2017-02-01 ENCOUNTER — Other Ambulatory Visit: Payer: Self-pay | Admitting: Urology

## 2017-02-01 DIAGNOSIS — D3000 Benign neoplasm of unspecified kidney: Secondary | ICD-10-CM

## 2017-02-09 ENCOUNTER — Encounter: Payer: Self-pay | Admitting: Physical Medicine & Rehabilitation

## 2017-02-14 ENCOUNTER — Ambulatory Visit (HOSPITAL_COMMUNITY)
Admission: RE | Admit: 2017-02-14 | Discharge: 2017-02-14 | Disposition: A | Discharge status: Home / Self Care | Payer: Medicare HMO | Source: Ambulatory Visit | Attending: Urology | Admitting: Urology

## 2017-02-14 DIAGNOSIS — N289 Disorder of kidney and ureter, unspecified: Secondary | ICD-10-CM | POA: Diagnosis not present

## 2017-02-14 DIAGNOSIS — D3000 Benign neoplasm of unspecified kidney: Secondary | ICD-10-CM | POA: Diagnosis present

## 2017-02-16 ENCOUNTER — Encounter: Payer: Medicare HMO | Attending: Physical Medicine & Rehabilitation | Admitting: Physical Medicine & Rehabilitation

## 2017-02-16 ENCOUNTER — Encounter: Payer: Self-pay | Admitting: Physical Medicine & Rehabilitation

## 2017-02-16 VITALS — BP 130/80 | HR 107

## 2017-02-16 DIAGNOSIS — Z9889 Other specified postprocedural states: Secondary | ICD-10-CM | POA: Insufficient documentation

## 2017-02-16 DIAGNOSIS — Z8619 Personal history of other infectious and parasitic diseases: Secondary | ICD-10-CM | POA: Diagnosis not present

## 2017-02-16 DIAGNOSIS — E039 Hypothyroidism, unspecified: Secondary | ICD-10-CM | POA: Insufficient documentation

## 2017-02-16 DIAGNOSIS — Z940 Kidney transplant status: Secondary | ICD-10-CM | POA: Insufficient documentation

## 2017-02-16 DIAGNOSIS — E785 Hyperlipidemia, unspecified: Secondary | ICD-10-CM | POA: Diagnosis not present

## 2017-02-16 DIAGNOSIS — E1142 Type 2 diabetes mellitus with diabetic polyneuropathy: Secondary | ICD-10-CM | POA: Insufficient documentation

## 2017-02-16 DIAGNOSIS — Z808 Family history of malignant neoplasm of other organs or systems: Secondary | ICD-10-CM | POA: Diagnosis not present

## 2017-02-16 DIAGNOSIS — I120 Hypertensive chronic kidney disease with stage 5 chronic kidney disease or end stage renal disease: Secondary | ICD-10-CM | POA: Insufficient documentation

## 2017-02-16 DIAGNOSIS — G479 Sleep disorder, unspecified: Secondary | ICD-10-CM | POA: Diagnosis not present

## 2017-02-16 DIAGNOSIS — Z833 Family history of diabetes mellitus: Secondary | ICD-10-CM | POA: Insufficient documentation

## 2017-02-16 DIAGNOSIS — N186 End stage renal disease: Secondary | ICD-10-CM | POA: Insufficient documentation

## 2017-02-16 DIAGNOSIS — M109 Gout, unspecified: Secondary | ICD-10-CM | POA: Diagnosis not present

## 2017-02-16 DIAGNOSIS — Z9851 Tubal ligation status: Secondary | ICD-10-CM | POA: Insufficient documentation

## 2017-02-16 DIAGNOSIS — E1122 Type 2 diabetes mellitus with diabetic chronic kidney disease: Secondary | ICD-10-CM | POA: Insufficient documentation

## 2017-02-16 DIAGNOSIS — K219 Gastro-esophageal reflux disease without esophagitis: Secondary | ICD-10-CM | POA: Diagnosis not present

## 2017-02-16 MED ORDER — PREGABALIN 150 MG PO CAPS: 150.0000 mg | ORAL_CAPSULE | Freq: Two times a day (BID) | ORAL | 1 refills | Status: DC

## 2017-02-16 MED ORDER — PREGABALIN 75 MG PO CAPS: 75.0000 mg | ORAL_CAPSULE | Freq: Every day | ORAL | 1 refills | Status: DC

## 2017-02-16 MED ORDER — PREGABALIN 150 MG PO CAPS: 150.0000 mg | ORAL_CAPSULE | ORAL | 1 refills | Status: DC | PRN

## 2017-02-16 MED ORDER — NORTRIPTYLINE HCL 25 MG PO CAPS: 25.0000 mg | ORAL_CAPSULE | Freq: Every day | ORAL | 1 refills | Status: DC

## 2017-02-16 NOTE — Progress Notes (Signed)
Subjective:    Patient ID: Wanda [name], female    DOB: [birth year], 56 y.o.   MRN: [id number]  HPI 56 y/o female with pmh/psh of ESRD on HD, hypotension, migraines, DM with neuropathy, renal transplant, gastric sleeve, presents with diabetic neuropathy.  Started ~4 years ago after dialysis, getting worse. Located mid-shins distally and hands.  Worst on dialysis days.  Rest improves the pain.  Laying down exacerbates the pain.  Pin/needles.  Constant. Lyrica provides temporary benefit.  Gabapentin does not help.  Capsaisin does not help.  Denies associated weakness.  Denies falls. Makes it difficult to sleep.    Pain Inventory Average Pain 6 Pain Right Now 6 My pain is sharp, stabbing, tingling and aching  In the last 24 hours, has pain interfered with the following? General activity 8 Relation with others 9 Enjoyment of life 9 What TIME of day is your pain at its worst? evening Sleep (in general) Poor  Pain is worse with: standing Pain improves with: rest Relief from Meds: 9  Mobility walk without assistance ability to climb steps?  no do you drive?  yes  Function retired  Neuro/Psych numbness tingling trouble walking spasms dizziness  Prior Studies Any changes since last visit?  no  Physicians involved in your care Any changes since last visit?  no   Family History  Problem Relation Age of Onset  . Colon cancer Father        deceased at age 20  . Diabetes Brother   . Neuropathy Neg Hx    Social History   Social History  . Marital status: Married    Spouse name: [name]  . Number of children: 2  . Years of education: 14   Occupational History  . Retired- disabled    Social History Main Topics  . Smoking status: Never Smoker  . Smokeless tobacco: Never Used  . Alcohol use No  . Drug use: No  . Sexual activity: Yes   Other Topics Concern  . None   Social History Narrative   Lives with husband and child.   Caffeine use: none   Past  Surgical History:  Procedure Laterality Date  . AV FISTULA PLACEMENT Right 2000   "lower arm" (07/24/2013)  . AV FISTULA PLACEMENT, RADIOCEPHALIC Right 6295   "upper arm" (07/24/2013)  . CAPD REMOVAL N/A 10/10/2016   Procedure: CONTINUOUS AMBULATORY PERITONEAL DIALYSIS  (CAPD) CATHETER REMOVAL;  Surgeon: Donnie Mesa, MD;  Location: [facility];  Service: General;  Laterality: N/A;  . CATARACT EXTRACTION W/ INTRAOCULAR LENS  IMPLANT, BILATERAL Bilateral 2011  . [place]; 1987  . CHOLECYSTECTOMY  2011  . COLONOSCOPY    . ENDOMETRIAL CRYOABLATION  1980's  . INTRAUTERINE DEVICE (IUD) INSERTION  2011  . LAPAROSCOPIC GASTRIC SLEEVE RESECTION N/A 05/26/2015   Procedure: LAPAROSCOPIC GASTRIC SLEEVE RESECTION;  Surgeon: Excell Seltzer, MD;  Location: [facility];  Service: General;  Laterality: N/A;  . TRANSPLANTATION RENAL  2013   "right" (09/04/2012)  . TUBAL LIGATION  1987  . [place]   "RLE; stripped" (09/04/2012)  . VIDEO BRONCHOSCOPY  09/07/2012   Procedure: VIDEO BRONCHOSCOPY WITHOUT FLUORO;  Surgeon: Chesley Mires, MD;  Location: [facility];  Service: Cardiopulmonary;  Laterality: Bilateral;   Past Medical History:  Diagnosis Date  . Anemia   . Arthritis    "hands" (07/24/2013) pt denies  . Chronic kidney disease, stage IV (severe) ([place])    "never went on dialysis" (07/24/2013)  .  Diabetes mellitus type II   . Diabetic peripheral neuropathy ([place])   . Dialysis patient [facility])    Monday Wednesday Friday   . Dyspnea on exertion    pt denies  . Foot pain   . GERD (gastroesophageal reflux disease)   . Gout   . History of chicken pox   . History of measles   . History of mumps   . Hyperlipidemia   . Hypothyroidism   . Migraines    "q 3 months" (07/24/2013)  . Nonspecific abnormal unspecified cardiovascular function study   . Obesity   . PAD (peripheral artery disease) ([place])   . Peripheral neuropathy   . Peripheral vascular disease ([place])   . Pneumonia  09/2012  . Renal transplant disorder   . Shingles   . Sleep apnea    "getting ready to get tested again cause dr says I have it" (07/24/2013) pt states was tested and was told she did not have sleep apnea / no cpap  . Unspecified essential hypertension    BP 130/80   Pulse (!) 107   SpO2 98%   Opioid Risk Score:   Fall Risk Score:  `1  Depression screen PHQ 2/9  Depression screen [facility] 2/9 08/18/2015 07/14/2015 04/27/2015  Decreased Interest 0 0 0  Down, Depressed, Hopeless 0 0 0  PHQ - 2 Score 0 0 0    Review of Systems  Constitutional: Positive for diaphoresis and unexpected weight change.  HENT: Negative.   Eyes: Negative.   Respiratory: Negative.   Cardiovascular: Negative.   Gastrointestinal: Positive for constipation.  Endocrine: Negative.   Genitourinary: Negative.   Musculoskeletal: Negative.   Skin: Negative.   Allergic/Immunologic: Negative.   Neurological: Positive for numbness.       Dysesthesias  Hematological: Negative.   Psychiatric/Behavioral: Negative.   All other systems reviewed and are negative.      Objective:   Physical Exam Gen: NAD. Vital signs reviewed HENT: Normocephalic, Atraumatic Eyes: EOMI. No discharge.  Cardio: RRR. No JVD. Pulm: B/l clear to auscultation.  Effort normal Abd: Soft, BS+ MSK:  Gait WNL.   No TTP.   Neuro:   Sensation diminished to light touch in b/l feet  Strength  5/5 in all LE myotomes Skin: Warm and Dry. Intact    Assessment & Plan:  56 y/o female with pmh/psh of ESRD on HD, hypotension, migraines, DM with neuropathy, renal transplant, gastric sleeve, presents with diabetic neuropathy.    1. Diabetic neuropathy  Labs reviewed  Referral information reviewed  NCCSRS reviewed  Heat/Cold, Gabapentin, Lidoderm patches, Voltaren gel, Capsaicin, Cymbalta, Elavil ineffective  Pamelor 25mg  ordered  Vit B12, Vit D ordered  Will change Lyrica to 75 AM and 150 after HD  Will consider PT in future for baths  Will  consider TENS  Will consider retrial of Gabapentin, Cymbalta, Elavil at higher doses   2. Sleep disturbance  Poor on dialysis days  Nortriptyline ordered

## 2017-02-16 NOTE — Addendum Note (Signed)
Addended by: Delice Lesch A on: 02/16/2017 11:18 AM   Modules accepted: Orders

## 2017-03-02 ENCOUNTER — Emergency Department (HOSPITAL_COMMUNITY): Payer: Medicare HMO

## 2017-03-02 ENCOUNTER — Emergency Department (HOSPITAL_COMMUNITY)
Admission: EM | Admit: 2017-03-02 | Discharge: 2017-03-02 | Disposition: A | Discharge status: Home / Self Care | Payer: Medicare HMO | Attending: Emergency Medicine | Admitting: Emergency Medicine

## 2017-03-02 ENCOUNTER — Encounter (HOSPITAL_COMMUNITY): Payer: Self-pay

## 2017-03-02 DIAGNOSIS — Y929 Unspecified place or not applicable: Secondary | ICD-10-CM | POA: Diagnosis not present

## 2017-03-02 DIAGNOSIS — W2201XA Walked into wall, initial encounter: Secondary | ICD-10-CM | POA: Diagnosis not present

## 2017-03-02 DIAGNOSIS — I129 Hypertensive chronic kidney disease with stage 1 through stage 4 chronic kidney disease, or unspecified chronic kidney disease: Secondary | ICD-10-CM | POA: Diagnosis not present

## 2017-03-02 DIAGNOSIS — Y9302 Activity, running: Secondary | ICD-10-CM | POA: Diagnosis not present

## 2017-03-02 DIAGNOSIS — S40011A Contusion of right shoulder, initial encounter: Secondary | ICD-10-CM | POA: Diagnosis not present

## 2017-03-02 DIAGNOSIS — N184 Chronic kidney disease, stage 4 (severe): Secondary | ICD-10-CM | POA: Insufficient documentation

## 2017-03-02 DIAGNOSIS — E1122 Type 2 diabetes mellitus with diabetic chronic kidney disease: Secondary | ICD-10-CM | POA: Diagnosis not present

## 2017-03-02 DIAGNOSIS — Z794 Long term (current) use of insulin: Secondary | ICD-10-CM | POA: Diagnosis not present

## 2017-03-02 DIAGNOSIS — S4991XA Unspecified injury of right shoulder and upper arm, initial encounter: Secondary | ICD-10-CM | POA: Diagnosis present

## 2017-03-02 DIAGNOSIS — E039 Hypothyroidism, unspecified: Secondary | ICD-10-CM | POA: Insufficient documentation

## 2017-03-02 DIAGNOSIS — Y999 Unspecified external cause status: Secondary | ICD-10-CM | POA: Diagnosis not present

## 2017-03-02 MED ORDER — DICLOFENAC SODIUM 1 % TD GEL: 4.0000 g | Freq: Four times a day (QID) | TRANSDERMAL | 0 refills | Status: DC

## 2017-03-02 NOTE — Discharge Instructions (Signed)
It was my pleasure taking care of you today!   Do not use sling for more than two days. This is for your comfort. Apply topical gel for pain. You can also use tylenol for pain. Ice or heat to the affected area will help as well.   If shoulder is not better in 1 week, please see your primary care provider for follow up.   Return to ER for new or worsening symptoms, any additional concerns.

## 2017-03-02 NOTE — ED Provider Notes (Signed)
[place] DEPT Provider Note   CSN: [id number] Arrival date & time: 03/02/17  1450  By signing my name below, I, Dora Sims, attest that this documentation has been prepared under the direction and in the presence of [facility], PA-C. Electronically Signed: Dora Sims, Scribe. 03/02/2017. 3:41 PM.  History   Chief Complaint Chief Complaint  Patient presents with  . Fall  . Neck Pain  . Shoulder Pain   The history is provided by the patient. No language interpreter was used.    HPI Comments: Wanda [name] is a 56 y.o. 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 She states she was walking down a hallway in her home and struck her right arm against the wall. No head trauma or LOC. She denies experiencing any popping or pulling sensations from her right arm when the incident occurred. Patient states her pain has not improved since onset and she has not tried any medications for pain. No h/o right upper extremity problems or surgeries. Patient has some baseline tingling in her extremities secondary to neuropathy and denies any acute changes. She states that her main concern today is her fistula of her right arm. She would like to make sure the fistula is still working. She denies nausea, vomiting, headaches, bruising, or any other associated symptoms.  Past Medical History:  Diagnosis Date  . Anemia   . Arthritis    "hands" (07/24/2013) pt denies  . Chronic kidney disease, stage IV (severe) ([place])    "never went on dialysis" (07/24/2013)  . Diabetes mellitus type II   . Diabetic peripheral neuropathy ([place])   . Dialysis patient [facility])    Monday Wednesday Friday   . Dyspnea on exertion    pt denies  . Foot pain   . GERD (gastroesophageal reflux  disease)   . Gout   . History of chicken pox   . History of measles   . History of mumps   . Hyperlipidemia   . Hypothyroidism   . Migraines    "q 3 months" (07/24/2013)  . Nonspecific abnormal unspecified cardiovascular function study   . Obesity   . PAD (peripheral artery disease) ([place])   . Peripheral neuropathy   . Peripheral vascular disease ([place])   . Pneumonia 09/2012  . Renal transplant disorder   . Shingles   . Sleep apnea    "getting ready to get tested again cause dr says I have it" (07/24/2013) pt states was tested and was told she did not have sleep apnea / no cpap  . Unspecified essential hypertension     Patient Active Problem List   Diagnosis Date Noted  . Hypokalemia 10/07/2016  . Peritonitis ([place]) 10/07/2016  . SBP (spontaneous bacterial peritonitis) ([place]) 10/07/2016  . Morbid obesity ([place]) 05/26/2015  . Hypoglycemia 07/24/2013  . Nausea and vomiting 01/08/2013  . Fever 01/08/2013  . Renal failure (ARF), acute on chronic (HCC) 01/08/2013  . Renal transplant disorder 09/04/2012  . Hyponatremia 09/04/2012  . CKD (chronic kidney disease) 09/04/2012  . HTN (hypertension) 09/04/2012  . Healthcare-associated pneumonia 09/04/2012  . Anemia 09/04/2012  . Cough 03/22/2011  . SHINGLES 12/21/2009  . HYPOTHYROIDISM 12/21/2009  . DM (diabetes mellitus), type 2 with renal complications ([place]) 36/62/9476  . HYPERLIPIDEMIA 12/21/2009  . OBESITY 12/21/2009  . DYSPNEA  12/21/2009  . MIGRAINES, HX OF 12/21/2009  . HYPERTENSION, UNSPECIFIED 11/20/2009  . CHRONIC KIDNEY DISEASE STAGE IV ([place]) 11/20/2009  . DYSPNEA ON EXERTION 11/20/2009  . NONSPECIFIC ABNORMAL UNSPEC CV FUNCTION STUDY 11/20/2009    Past Surgical History:  Procedure Laterality Date  . AV FISTULA PLACEMENT Right 2000   "lower arm" (07/24/2013)  . AV FISTULA PLACEMENT, RADIOCEPHALIC Right 2297   "upper arm" (07/24/2013)  . CAPD REMOVAL N/A 10/10/2016   Procedure: CONTINUOUS AMBULATORY PERITONEAL  DIALYSIS  (CAPD) CATHETER REMOVAL;  Surgeon: Donnie Mesa, MD;  Location: [place];  Service: General;  Laterality: N/A;  . CATARACT EXTRACTION W/ INTRAOCULAR LENS  IMPLANT, BILATERAL Bilateral 2011  . [place]; 1987  . CHOLECYSTECTOMY  2011  . COLONOSCOPY    . ENDOMETRIAL CRYOABLATION  1980's  . INTRAUTERINE DEVICE (IUD) INSERTION  2011  . LAPAROSCOPIC GASTRIC SLEEVE RESECTION N/A 05/26/2015   Procedure: LAPAROSCOPIC GASTRIC SLEEVE RESECTION;  Surgeon: Excell Seltzer, MD;  [facility];  Service: General;  Laterality: N/A;  . TRANSPLANTATION RENAL  2013   "right" (09/04/2012)  . TUBAL LIGATION  1987  . [place]   "RLE; stripped" (09/04/2012)  . VIDEO BRONCHOSCOPY  09/07/2012   Procedure: VIDEO BRONCHOSCOPY WITHOUT FLUORO;  Surgeon: Chesley Mires, MD;  Location: [facility] ENDOSCOPY;  Service: Cardiopulmonary;  Laterality: Bilateral;    OB History    No data available       Home Medications    Prior to Admission medications   Medication Sig Start Date End Date Taking? Authorizing Provider  allopurinol (ZYLOPRIM) 300 MG tablet Take by mouth. 01/14/15   [provider]  diclofenac sodium (VOLTAREN) 1 % GEL Apply 4 g topically 4 (four) times daily. 03/02/17   Kamile Fassler, Ozella Almond, PA-C  hydrOXYzine (ATARAX/VISTARIL) 25 MG tablet Take 25 mg by mouth 4 (four) times daily as needed for itching.  07/13/16   [provider]  insulin aspart (NOVOLOG FLEXPEN) 100 UNIT/ML FlexPen Inject 10 Units into the skin 3 (three) times daily with meals. 05/28/15   Excell Seltzer, MD  L-Methylfolate-Algae-B12-B6 [place]) 3-90.314-2-35 MG CAPS Take 1 tablet by mouth daily. 01/18/17   [provider]  nortriptyline (PAMELOR) 25 MG capsule Take 1 capsule (25 mg total) by mouth at bedtime. 02/16/17   Jamse Arn, MD  pregabalin (LYRICA) 150 MG capsule Take 1 capsule (150 mg total) by mouth every dialysis. Take after HD 02/16/17   Jamse Arn, MD    pregabalin (LYRICA) 75 MG capsule Take 1 capsule (75 mg total) by mouth daily. 02/16/17   Jamse Arn, MD  sevelamer carbonate (RENVELA) 2.4 g PACK Take 2.4 g by mouth See admin instructions. Sprinkle 1 packet (2.4 g) with every meal and with snacks    [provider]    Family History Family History  Problem Relation Age of Onset  . Colon cancer Father        deceased at age 45  . Diabetes Brother   . Neuropathy Neg Hx     Social History Social History  Substance Use Topics  . Smoking status: Never Smoker  . Smokeless tobacco: Never Used  . Alcohol use No     Allergies   Hydrocodone-acetaminophen; Propoxyphene n-acetaminophen; and Cephalosporins   Review of Systems Review of Systems  Gastrointestinal: Negative for nausea and vomiting.  Musculoskeletal: Positive for myalgias and neck pain.  Skin: Negative for color change and wound.  Neurological: Positive for numbness (baseline). Negative  for syncope and headaches.   Physical Exam Updated Vital Signs BP 132/72 (BP Location: Left Arm)   Pulse 100   Temp 98.2 F (36.8 C) (Oral)   Resp 16   Ht 5\' 9"  (1.753 m)   Wt 77.1 kg (170 lb)   SpO2 100%   BMI 25.10 kg/m   Physical Exam  Constitutional: She is oriented to person, place, and time. She appears well-developed and well-nourished. No distress.  HENT:  Head: Normocephalic and atraumatic.  Neck:  No midline tenderness. Right paraspinal muscular tenderness.  Cardiovascular: Normal rate, regular rhythm and normal heart sounds.   No murmur heard. Pulmonary/Chest: Effort normal and breath sounds normal. No respiratory distress.  Abdominal: Soft. She exhibits no distension. There is no tenderness.  Musculoskeletal:  Fistula to right arm with good thrill. Right shoulder with tenderness to palpation of trap and rotator cuff musculature. Full ROM. Negative empty can test, Negative Neer's. No swelling, erythema or ecchymosis present. No step-off, crepitus,  or deformity appreciated. 5/5 muscle strength of bilateral UE. 2+ radial pulse, sensation intact and all compartments soft.  Neurological: She is alert and oriented to person, place, and time.  Skin: Skin is warm and dry. Capillary refill takes less than 2 seconds.  Nursing note and vitals reviewed.  ED Treatments / Results  Labs (all labs ordered are listed, but only abnormal results are displayed) Labs Reviewed - No data to display  EKG  EKG Interpretation None       Radiology Dg Shoulder Right  Result Date: 03/02/2017 CLINICAL DATA:  Fall last night with right shoulder pain. Initial encounter. EXAM: RIGHT SHOULDER - 2+ VIEW COMPARISON:  None. FINDINGS: There is no evidence of fracture or dislocation. There is no evidence of arthropathy or other focal bone abnormality. Soft tissues are unremarkable. IMPRESSION: Negative. Electronically Signed   By: Monte Fantasia M.D.   On: 03/02/2017 15:42    Procedures Procedures (including critical care time)  DIAGNOSTIC STUDIES: Oxygen Saturation is 100% on RA, normal by my interpretation.    COORDINATION OF CARE: 3:40 PM Discussed treatment plan with pt at bedside and pt agreed to plan.  Medications Ordered in ED Medications - No data to display   Initial Impression / Assessment and Plan / ED Course  I have reviewed the triage vital signs and the nursing notes.  Pertinent labs & imaging results that were available during my care of the patient were reviewed by me and considered in my medical decision making (see chart for details).    Wanda [name] is a 56 y.o. female who presents to ED for Right-sided neck and shoulder pain after running into a wall yesterday. Her main concern is her fistula to her right forearm. She would like to ensure that no injury to her fistula recurred and that it is working properly. On exam, fistula has a great thrill and there are no signs of trauma surrounding. Patient reassured. She has muscular  tenderness to the neck and posterior shoulder. X-ray negative. No midline tenderness. Rx for voltaren gel for pain. Ice/heat discussed. PCP follow up if symptoms persist. Return precautions discussed and all questions answered.  Final Clinical Impressions(s) / ED Diagnoses   Final diagnoses:  Contusion of right shoulder, initial encounter    New Prescriptions Discharge Medication List as of 03/02/2017  3:50 PM    START taking these medications   Details  diclofenac sodium (VOLTAREN) 1 % GEL Apply 4 g topically 4 (four) times daily., Starting Thu  03/02/2017, Print       I personally performed the services described in this documentation, which was scribed in my presence. The recorded information has been reviewed and is accurate.    Kingslee Dowse, Ozella Almond, PA-C 03/02/17 Patrecia Pour    Tanna Furry, MD 03/13/17 [phone]

## 2017-03-02 NOTE — ED Triage Notes (Signed)
Patient states she ran into a wall last night and knocked herself onto the floor. Patient c/oright lateral neck pain, right shoulder pain.

## 2017-03-06 NOTE — Addendum Note (Signed)
Addendum  created 03/06/17 0958 by Oleta Mouse, MD   Sign clinical note

## 2017-03-16 ENCOUNTER — Encounter: Payer: Medicare HMO | Admitting: Physical Medicine & Rehabilitation

## 2017-04-13 ENCOUNTER — Encounter: Payer: Medicare HMO | Admitting: Physical Medicine & Rehabilitation

## 2017-04-18 ENCOUNTER — Other Ambulatory Visit: Payer: Self-pay | Admitting: Obstetrics and Gynecology

## 2017-04-18 DIAGNOSIS — Z803 Family history of malignant neoplasm of breast: Secondary | ICD-10-CM

## 2017-04-22 ENCOUNTER — Other Ambulatory Visit: Payer: Self-pay | Admitting: Physical Medicine & Rehabilitation

## 2017-05-02 ENCOUNTER — Inpatient Hospital Stay
Admission: RE | Admit: 2017-05-02 | Discharge: 2017-05-02 | Disposition: A | Discharge status: Home / Self Care | Payer: Self-pay | Source: Ambulatory Visit | Attending: Obstetrics and Gynecology | Admitting: Obstetrics and Gynecology

## 2017-05-03 ENCOUNTER — Other Ambulatory Visit: Payer: Self-pay | Admitting: Physical Medicine & Rehabilitation

## 2017-05-09 ENCOUNTER — Inpatient Hospital Stay: Admission: RE | Admit: 2017-05-09 | Payer: Self-pay | Source: Ambulatory Visit

## 2017-05-11 ENCOUNTER — Encounter: Payer: Medicare HMO | Attending: Physical Medicine & Rehabilitation | Admitting: Physical Medicine & Rehabilitation

## 2017-05-11 DIAGNOSIS — E785 Hyperlipidemia, unspecified: Secondary | ICD-10-CM | POA: Insufficient documentation

## 2017-05-11 DIAGNOSIS — Z808 Family history of malignant neoplasm of other organs or systems: Secondary | ICD-10-CM | POA: Insufficient documentation

## 2017-05-11 DIAGNOSIS — M109 Gout, unspecified: Secondary | ICD-10-CM | POA: Insufficient documentation

## 2017-05-11 DIAGNOSIS — I120 Hypertensive chronic kidney disease with stage 5 chronic kidney disease or end stage renal disease: Secondary | ICD-10-CM | POA: Insufficient documentation

## 2017-05-11 DIAGNOSIS — E1122 Type 2 diabetes mellitus with diabetic chronic kidney disease: Secondary | ICD-10-CM | POA: Insufficient documentation

## 2017-05-11 DIAGNOSIS — Z940 Kidney transplant status: Secondary | ICD-10-CM | POA: Insufficient documentation

## 2017-05-11 DIAGNOSIS — Z9889 Other specified postprocedural states: Secondary | ICD-10-CM | POA: Insufficient documentation

## 2017-05-11 DIAGNOSIS — E1142 Type 2 diabetes mellitus with diabetic polyneuropathy: Secondary | ICD-10-CM | POA: Insufficient documentation

## 2017-05-11 DIAGNOSIS — Z833 Family history of diabetes mellitus: Secondary | ICD-10-CM | POA: Insufficient documentation

## 2017-05-11 DIAGNOSIS — Z9851 Tubal ligation status: Secondary | ICD-10-CM | POA: Insufficient documentation

## 2017-05-11 DIAGNOSIS — K219 Gastro-esophageal reflux disease without esophagitis: Secondary | ICD-10-CM | POA: Insufficient documentation

## 2017-05-11 DIAGNOSIS — E039 Hypothyroidism, unspecified: Secondary | ICD-10-CM | POA: Insufficient documentation

## 2017-05-11 DIAGNOSIS — Z8619 Personal history of other infectious and parasitic diseases: Secondary | ICD-10-CM | POA: Insufficient documentation

## 2017-05-11 DIAGNOSIS — G479 Sleep disorder, unspecified: Secondary | ICD-10-CM | POA: Insufficient documentation

## 2017-05-11 DIAGNOSIS — N186 End stage renal disease: Secondary | ICD-10-CM | POA: Insufficient documentation

## 2017-06-08 ENCOUNTER — Encounter: Payer: Medicare HMO | Attending: Physical Medicine & Rehabilitation | Admitting: Physical Medicine & Rehabilitation

## 2017-06-08 ENCOUNTER — Encounter: Payer: Self-pay | Admitting: Physical Medicine & Rehabilitation

## 2017-06-08 VITALS — BP 128/79 | HR 106

## 2017-06-08 DIAGNOSIS — Z808 Family history of malignant neoplasm of other organs or systems: Secondary | ICD-10-CM | POA: Insufficient documentation

## 2017-06-08 DIAGNOSIS — Z9889 Other specified postprocedural states: Secondary | ICD-10-CM | POA: Diagnosis not present

## 2017-06-08 DIAGNOSIS — Z9851 Tubal ligation status: Secondary | ICD-10-CM | POA: Insufficient documentation

## 2017-06-08 DIAGNOSIS — N186 End stage renal disease: Secondary | ICD-10-CM | POA: Insufficient documentation

## 2017-06-08 DIAGNOSIS — G479 Sleep disorder, unspecified: Secondary | ICD-10-CM | POA: Diagnosis not present

## 2017-06-08 DIAGNOSIS — E785 Hyperlipidemia, unspecified: Secondary | ICD-10-CM | POA: Insufficient documentation

## 2017-06-08 DIAGNOSIS — E1122 Type 2 diabetes mellitus with diabetic chronic kidney disease: Secondary | ICD-10-CM | POA: Diagnosis not present

## 2017-06-08 DIAGNOSIS — Z833 Family history of diabetes mellitus: Secondary | ICD-10-CM | POA: Insufficient documentation

## 2017-06-08 DIAGNOSIS — Z8619 Personal history of other infectious and parasitic diseases: Secondary | ICD-10-CM | POA: Diagnosis not present

## 2017-06-08 DIAGNOSIS — E1142 Type 2 diabetes mellitus with diabetic polyneuropathy: Secondary | ICD-10-CM | POA: Diagnosis not present

## 2017-06-08 DIAGNOSIS — E039 Hypothyroidism, unspecified: Secondary | ICD-10-CM | POA: Diagnosis not present

## 2017-06-08 DIAGNOSIS — Z940 Kidney transplant status: Secondary | ICD-10-CM | POA: Diagnosis not present

## 2017-06-08 DIAGNOSIS — I120 Hypertensive chronic kidney disease with stage 5 chronic kidney disease or end stage renal disease: Secondary | ICD-10-CM | POA: Insufficient documentation

## 2017-06-08 DIAGNOSIS — K219 Gastro-esophageal reflux disease without esophagitis: Secondary | ICD-10-CM | POA: Diagnosis not present

## 2017-06-08 DIAGNOSIS — M109 Gout, unspecified: Secondary | ICD-10-CM | POA: Insufficient documentation

## 2017-06-08 MED ORDER — NORTRIPTYLINE HCL 25 MG PO CAPS: 25.0000 mg | ORAL_CAPSULE | Freq: Every day | ORAL | 1 refills | Status: DC

## 2017-06-08 MED ORDER — PREGABALIN 150 MG PO CAPS: 150.0000 mg | ORAL_CAPSULE | ORAL | 1 refills | Status: DC | PRN

## 2017-06-08 MED ORDER — PREGABALIN 75 MG PO CAPS: 75.0000 mg | ORAL_CAPSULE | Freq: Every day | ORAL | 1 refills | Status: DC

## 2017-06-08 NOTE — Addendum Note (Signed)
Addended by: Delice Lesch A on: 06/08/2017 03:34 PM   Modules accepted: Orders

## 2017-06-08 NOTE — Progress Notes (Signed)
Subjective:    Patient ID: Wanda [name], female    DOB: Oct 20, 1960, 56 y.o.   MRN: [id number]  HPI 56 y/o female with pmh/psh of ESRD on HD, hypotension, migraines, DM with neuropathy, renal transplant, gastric sleeve, presents for follow up for diabetic neuropathy.   Initially stated: Started ~4 years ago after dialysis, getting worse. Located mid-shins distally and hands.  Worst on dialysis days.  Rest improves the pain.  Laying down exacerbates the pain.  Pin/needles.  Constant. Lyrica provides temporary benefit.  Gabapentin does not help.  Capsaisin does not help.  Denies associated weakness.  Denies falls. Makes it difficult to sleep.    Last clinic visit 02/16/17.  Since last visit, she went to the ED after a falls, notes reviewed. Pamelor helps only for a brief period.  She states she had her Vit B12/D, however, no record.  The Lyrica helps her.     Pain Inventory Average Pain 6 Pain Right Now 6 My pain is sharp, stabbing, tingling and aching  In the last 24 hours, has pain interfered with the following? General activity 4 Relation with others 7 Enjoyment of life 7 What TIME of day is your pain at its worst? evening, night Sleep (in general) Poor  Pain is worse with: walking and standing Pain improves with: rest and medication Relief from Meds: 2  Mobility walk without assistance ability to climb steps?  no do you drive?  yes Do you have any goals in this area?  no  Function disabled: date disabled .  Neuro/Psych weakness numbness tingling anxiety  Prior Studies Any changes since last visit?  no  Physicians involved in your care Any changes since last visit?  no   Family History  Problem Relation Age of Onset  . Colon cancer Father        deceased at age 95  . Diabetes Brother   . Neuropathy Neg Hx    Social History   Social History  . Marital status: Married    Spouse name: [name]  . Number of children: 2  . Years of education: 14    Occupational History  . Retired- disabled    Social History Main Topics  . Smoking status: Never Smoker  . Smokeless tobacco: Never Used  . Alcohol use No  . Drug use: No  . Sexual activity: Yes   Other Topics Concern  . None   Social History Narrative   Lives with husband and child.   Caffeine use: none   Past Surgical History:  Procedure Laterality Date  . AV FISTULA PLACEMENT Right 2000   "lower arm" (07/24/2013)  . AV FISTULA PLACEMENT, RADIOCEPHALIC Right 6387   "upper arm" (07/24/2013)  . CAPD REMOVAL N/A 10/10/2016   Procedure: CONTINUOUS AMBULATORY PERITONEAL DIALYSIS  (CAPD) CATHETER REMOVAL;  Surgeon: Donnie Mesa, MD;  Location: [facility];  Service: General;  Laterality: N/A;  . CATARACT EXTRACTION W/ INTRAOCULAR LENS  IMPLANT, BILATERAL Bilateral 2011  . [place]; 1987  . CHOLECYSTECTOMY  2011  . COLONOSCOPY    . ENDOMETRIAL CRYOABLATION  1980's  . INTRAUTERINE DEVICE (IUD) INSERTION  2011  . LAPAROSCOPIC GASTRIC SLEEVE RESECTION N/A 05/26/2015   Procedure: LAPAROSCOPIC GASTRIC SLEEVE RESECTION;  Surgeon: Excell Seltzer, MD;  [facility];  Service: General;  Laterality: N/A;  . TRANSPLANTATION RENAL  2013   "right" (09/04/2012)  . TUBAL LIGATION  1987  . [place]   "RLE; stripped" (09/04/2012)  .  VIDEO BRONCHOSCOPY  09/07/2012   Procedure: VIDEO BRONCHOSCOPY WITHOUT FLUORO;  Surgeon: Chesley Mires, MD;  Location[facility] ENDOSCOPY;  Service: Cardiopulmonary;  Laterality: Bilateral;   Past Medical History:  Diagnosis Date  . Anemia   . Arthritis    "hands" (07/24/2013) pt denies  . Chronic kidney disease, stage IV (severe) ([place])    "never went on dialysis" (07/24/2013)  . Diabetes mellitus type II   . Diabetic peripheral neuropathy ([place])   . Dialysis patient [facility])    Monday Wednesday Friday   . Dyspnea on exertion    pt denies  . Foot pain   . GERD (gastroesophageal reflux disease)   . Gout   . History of chicken pox    . History of measles   . History of mumps   . Hyperlipidemia   . Hypothyroidism   . Migraines    "q 3 months" (07/24/2013)  . Nonspecific abnormal unspecified cardiovascular function study   . Obesity   . PAD (peripheral artery disease) ([place])   . Peripheral neuropathy   . Peripheral vascular disease ([place])   . Pneumonia 09/2012  . Renal transplant disorder   . Shingles   . Sleep apnea    "getting ready to get tested again cause dr says I have it" (07/24/2013) pt states was tested and was told she did not have sleep apnea / no cpap  . Unspecified essential hypertension    BP 128/79 (BP Location: Left Arm, Patient Position: Sitting, Cuff Size: Normal)   Pulse (!) 106   SpO2 96%   Opioid Risk Score:   Fall Risk Score:  `1  Depression screen PHQ 2/9  Depression screen [facility] 2/9 08/18/2015 07/14/2015 04/27/2015  Decreased Interest 0 0 0  Down, Depressed, Hopeless 0 0 0  PHQ - 2 Score 0 0 0    Review of Systems  Constitutional: Positive for diaphoresis and unexpected weight change.  HENT: Negative.   Eyes: Negative.   Respiratory: Positive for cough.   Cardiovascular: Negative.   Gastrointestinal: Positive for constipation and vomiting.  Endocrine:       High, low blood sugar  Genitourinary: Negative.   Musculoskeletal: Positive for arthralgias and myalgias.  Skin: Negative.   Allergic/Immunologic: Negative.   Neurological: Positive for weakness and numbness.       Dysesthesias, neuropathy, tingling  Hematological: Negative.   Psychiatric/Behavioral: The patient is nervous/anxious.   All other systems reviewed and are negative.      Objective:   Physical Exam Gen: NAD. Vital signs reviewed HENT: Normocephalic, Atraumatic Eyes: EOMI. No discharge.  Cardio: RRR. No JVD. Pulm: B/l clear to auscultation.  Effort normal Abd: Soft, BS+ MSK:  Gait WNL.   No TTP.   Neuro:   Sensation diminished to light touch in b/l feet  Strength  5/5 in all LE myotomes Skin: Warm  and Dry. Intact    Assessment & Plan:  56 y/o female with pmh/psh of ESRD on HD, hypotension, migraines, DM with neuropathy, renal transplant, gastric sleeve, presents for follow up for diabetic neuropathy.    1. Diabetic neuropathy  Labs reviewed  Heat/Cold, Gabapentin, Lidoderm patches, Voltaren gel, Capsaicin, Cymbalta, Elavil ineffective  Pamelor increased to 25mg  BID  Vit B12, Vit D ordered, not in chart, will reoder  Cont Lyrica to 75 AM and 150 after HD  Will order OT for baths  Will consider TENS  Will consider retrial of Gabapentin, Cymbalta, Elavil at higher doses   2. Sleep disturbance  Poor on  dialysis days  Nortriptyline increased

## 2017-06-13 ENCOUNTER — Telehealth: Payer: Self-pay | Admitting: *Deleted

## 2017-06-13 ENCOUNTER — Telehealth: Payer: Self-pay

## 2017-06-13 NOTE — Telephone Encounter (Signed)
Notified by msg on VM with the number to call.

## 2017-06-13 NOTE — Telephone Encounter (Signed)
Patient called requesting a status on her OT referral, checked her record and the referral was approved by her insurance company and is waiting for the [facility] rehab to call her with an appointment, called patient back and gave her this information

## 2017-06-13 NOTE — Telephone Encounter (Signed)
It has been sent to our [facility] location.  She will need to call them.

## 2017-06-13 NOTE — Telephone Encounter (Signed)
Aaron is calling about her outpt OT referral.

## 2017-07-06 ENCOUNTER — Encounter: Payer: Self-pay | Admitting: Physical Medicine & Rehabilitation

## 2017-07-06 ENCOUNTER — Encounter: Payer: Medicare HMO | Attending: Physical Medicine & Rehabilitation | Admitting: Physical Medicine & Rehabilitation

## 2017-07-06 VITALS — BP 111/74 | HR 103 | Resp 14

## 2017-07-06 DIAGNOSIS — E1122 Type 2 diabetes mellitus with diabetic chronic kidney disease: Secondary | ICD-10-CM | POA: Diagnosis not present

## 2017-07-06 DIAGNOSIS — Z8619 Personal history of other infectious and parasitic diseases: Secondary | ICD-10-CM | POA: Diagnosis not present

## 2017-07-06 DIAGNOSIS — E785 Hyperlipidemia, unspecified: Secondary | ICD-10-CM | POA: Insufficient documentation

## 2017-07-06 DIAGNOSIS — Z940 Kidney transplant status: Secondary | ICD-10-CM | POA: Diagnosis not present

## 2017-07-06 DIAGNOSIS — G479 Sleep disorder, unspecified: Secondary | ICD-10-CM | POA: Diagnosis not present

## 2017-07-06 DIAGNOSIS — M109 Gout, unspecified: Secondary | ICD-10-CM | POA: Diagnosis not present

## 2017-07-06 DIAGNOSIS — Z9889 Other specified postprocedural states: Secondary | ICD-10-CM | POA: Diagnosis not present

## 2017-07-06 DIAGNOSIS — E1142 Type 2 diabetes mellitus with diabetic polyneuropathy: Secondary | ICD-10-CM | POA: Insufficient documentation

## 2017-07-06 DIAGNOSIS — Z808 Family history of malignant neoplasm of other organs or systems: Secondary | ICD-10-CM | POA: Diagnosis not present

## 2017-07-06 DIAGNOSIS — Z9851 Tubal ligation status: Secondary | ICD-10-CM | POA: Diagnosis not present

## 2017-07-06 DIAGNOSIS — I120 Hypertensive chronic kidney disease with stage 5 chronic kidney disease or end stage renal disease: Secondary | ICD-10-CM | POA: Insufficient documentation

## 2017-07-06 DIAGNOSIS — N186 End stage renal disease: Secondary | ICD-10-CM | POA: Diagnosis not present

## 2017-07-06 DIAGNOSIS — E039 Hypothyroidism, unspecified: Secondary | ICD-10-CM | POA: Insufficient documentation

## 2017-07-06 DIAGNOSIS — K219 Gastro-esophageal reflux disease without esophagitis: Secondary | ICD-10-CM | POA: Insufficient documentation

## 2017-07-06 DIAGNOSIS — Z833 Family history of diabetes mellitus: Secondary | ICD-10-CM | POA: Insufficient documentation

## 2017-07-06 NOTE — Progress Notes (Signed)
Subjective:    Patient ID: Wanda [name], female    DOB: 09-29-1961, 56 y.o.   MRN: [id number]  HPI 56 y/o female with pmh/psh of ESRD on HD, hypotension, migraines, DM with neuropathy, renal transplant, gastric sleeve, presents for follow up for diabetic neuropathy.   Initially stated: Started ~4 years ago after dialysis, getting worse. Located mid-shins distally and hands.  Worst on dialysis days.  Rest improves the pain.  Laying down exacerbates the pain.  Pin/needles.  Constant. Lyrica provides temporary benefit.  Gabapentin does not help.  Capsaisin does not help.  Denies associated weakness.  Denies falls. Makes it difficult to sleep.    Last clinic visit 06/08/17.  Since that time, she notices some improvement with increase in Pamelor.  She had her labs drawn, pending.  She still has not heard from OT regarding baths.  Sleep has not improved.   Pain Inventory Average Pain 8 Pain Right Now 6 My pain is sharp, burning, stabbing, tingling and aching  In the last 24 hours, has pain interfered with the following? General activity 5 Relation with others 5 Enjoyment of life 2 What TIME of day is your pain at its worst? evening, night Sleep (in general) Poor  Pain is worse with: walking and standing Pain improves with: rest and medication Relief from Meds: 2  Mobility walk without assistance how many minutes can you walk? 10 ability to climb steps?  no do you drive?  yes Do you have any goals in this area?  no  Function disabled: date disabled . Do you have any goals in this area?  no  Neuro/Psych weakness numbness tingling anxiety  Prior Studies Any changes since last visit?  no  Physicians involved in your care Any changes since last visit?  no   Family History  Problem Relation Age of Onset  . Colon cancer Father        deceased at age 57  . Diabetes Brother   . Neuropathy Neg Hx    Social History   Social History  . Marital status: Married   Spouse name: [name]  . Number of children: 2  . Years of education: 14   Occupational History  . Retired- disabled    Social History Main Topics  . Smoking status: Never Smoker  . Smokeless tobacco: Never Used  . Alcohol use No  . Drug use: No  . Sexual activity: Yes   Other Topics Concern  . None   Social History Narrative   Lives with husband and child.   Caffeine use: none   Past Surgical History:  Procedure Laterality Date  . AV FISTULA PLACEMENT Right 2000   "lower arm" (07/24/2013)  . AV FISTULA PLACEMENT, RADIOCEPHALIC Right 3329   "upper arm" (07/24/2013)  . CAPD REMOVAL N/A 10/10/2016   Procedure: CONTINUOUS AMBULATORY PERITONEAL DIALYSIS  (CAPD) CATHETER REMOVAL;  Surgeon: Donnie Mesa, MD;  Location: [place];  Service: General;  Laterality: N/A;  . CATARACT EXTRACTION W/ INTRAOCULAR LENS  IMPLANT, BILATERAL Bilateral 2011  . [place]; 1987  . CHOLECYSTECTOMY  2011  . COLONOSCOPY    . ENDOMETRIAL CRYOABLATION  1980's  . INTRAUTERINE DEVICE (IUD) INSERTION  2011  . LAPAROSCOPIC GASTRIC SLEEVE RESECTION N/A 05/26/2015   Procedure: LAPAROSCOPIC GASTRIC SLEEVE RESECTION;  Surgeon: Excell Seltzer, MD;  [facility];  Service: General;  Laterality: N/A;  . TRANSPLANTATION RENAL  2013   "right" (09/04/2012)  . TUBAL LIGATION  1987  . VARICOSE  [place]   "RLE; stripped" (09/04/2012)  . VIDEO BRONCHOSCOPY  09/07/2012   Procedure: VIDEO BRONCHOSCOPY WITHOUT FLUORO;  Surgeon: Chesley Mires, MD;  Location[facility];  Service: Cardiopulmonary;  Laterality: Bilateral;   Past Medical History:  Diagnosis Date  . Anemia   . Arthritis    "hands" (07/24/2013) pt denies  . Chronic kidney disease, stage IV (severe) ([place])    "never went on dialysis" (07/24/2013)  . Diabetes mellitus type II   . Diabetic peripheral neuropathy ([place])   . Dialysis patient [facility])    Monday Wednesday Friday   . Dyspnea on exertion    pt denies  . Foot pain   . GERD  (gastroesophageal reflux disease)   . Gout   . History of chicken pox   . History of measles   . History of mumps   . Hyperlipidemia   . Hypothyroidism   . Migraines    "q 3 months" (07/24/2013)  . Nonspecific abnormal unspecified cardiovascular function study   . Obesity   . PAD (peripheral artery disease) ([place])   . Peripheral neuropathy   . Peripheral vascular disease ([place])   . Pneumonia 09/2012  . Renal transplant disorder   . Shingles   . Sleep apnea    "getting ready to get tested again cause dr says I have it" (07/24/2013) pt states was tested and was told she did not have sleep apnea / no cpap  . Unspecified essential hypertension    BP 111/74 (BP Location: Left Arm, Patient Position: Sitting, Cuff Size: Large)   Pulse (!) 103   Resp 14   SpO2 99%   Opioid Risk Score:   Fall Risk Score:  `1  Depression screen PHQ 2/9  Depression screen [facility] 2/9 08/18/2015 07/14/2015 04/27/2015  Decreased Interest 0 0 0  Down, Depressed, Hopeless 0 0 0  PHQ - 2 Score 0 0 0    Review of Systems  HENT: Negative.   Eyes: Negative.   Respiratory: Negative.   Cardiovascular: Negative.   Gastrointestinal: Positive for constipation.  Endocrine:       High, low blood sugar  Genitourinary: Negative.   Musculoskeletal: Positive for arthralgias, myalgias, neck pain and neck stiffness.  Skin: Negative.   Allergic/Immunologic: Negative.   Neurological: Positive for weakness and numbness.       Dysesthesias, neuropathy, tingling  Hematological: Negative.   Psychiatric/Behavioral: The patient is nervous/anxious.   All other systems reviewed and are negative.      Objective:   Physical Exam Gen: NAD. Vital signs reviewed HENT: Normocephalic, Atraumatic Eyes: EOMI. No discharge.  Cardio: RRR. No JVD. Pulm: B/l clear to auscultation.  Effort normal. Abd: Soft, BS+ MSK:  Gait WNL.   No TTP.   Neuro:   Sensation diminished to light touch in b/l feet  Strength  5/5 in all LE  myotomes Skin: Warm and Dry. Intact    Assessment & Plan:  56 y/o female with pmh/psh of ESRD on HD, hypotension, migraines, DM with neuropathy, renal transplant, gastric sleeve, presents for follow up for diabetic neuropathy.    1. Diabetic neuropathy  Heat/Cold, Gabapentin, Lidoderm patches, Voltaren gel, Capsaicin, Elavil ineffective  Side effects with Gabapentin  Pamelor 25mg  BID, pt is only taking daily  Vit B12, Vit D ordered, not in chart, pt states she had it done, will inquire  Cont Lyrica to 75 AM and 150 after HD  Awaiting trial of OT baths  Will order TENS IT  Will  consider trial of Cymbalta   2. Sleep disturbance  Poor on dialysis days  Nortriptyline increased

## 2017-08-03 ENCOUNTER — Encounter: Payer: Medicare HMO | Attending: Physical Medicine & Rehabilitation | Admitting: Physical Medicine & Rehabilitation

## 2017-08-03 DIAGNOSIS — Z940 Kidney transplant status: Secondary | ICD-10-CM | POA: Insufficient documentation

## 2017-08-03 DIAGNOSIS — M109 Gout, unspecified: Secondary | ICD-10-CM | POA: Insufficient documentation

## 2017-08-03 DIAGNOSIS — N186 End stage renal disease: Secondary | ICD-10-CM | POA: Insufficient documentation

## 2017-08-03 DIAGNOSIS — K219 Gastro-esophageal reflux disease without esophagitis: Secondary | ICD-10-CM | POA: Insufficient documentation

## 2017-08-03 DIAGNOSIS — Z9851 Tubal ligation status: Secondary | ICD-10-CM | POA: Insufficient documentation

## 2017-08-03 DIAGNOSIS — E785 Hyperlipidemia, unspecified: Secondary | ICD-10-CM | POA: Insufficient documentation

## 2017-08-03 DIAGNOSIS — Z9889 Other specified postprocedural states: Secondary | ICD-10-CM | POA: Insufficient documentation

## 2017-08-03 DIAGNOSIS — E039 Hypothyroidism, unspecified: Secondary | ICD-10-CM | POA: Insufficient documentation

## 2017-08-03 DIAGNOSIS — Z808 Family history of malignant neoplasm of other organs or systems: Secondary | ICD-10-CM | POA: Insufficient documentation

## 2017-08-03 DIAGNOSIS — I120 Hypertensive chronic kidney disease with stage 5 chronic kidney disease or end stage renal disease: Secondary | ICD-10-CM | POA: Insufficient documentation

## 2017-08-03 DIAGNOSIS — G479 Sleep disorder, unspecified: Secondary | ICD-10-CM | POA: Insufficient documentation

## 2017-08-03 DIAGNOSIS — E1122 Type 2 diabetes mellitus with diabetic chronic kidney disease: Secondary | ICD-10-CM | POA: Insufficient documentation

## 2017-08-03 DIAGNOSIS — Z8619 Personal history of other infectious and parasitic diseases: Secondary | ICD-10-CM | POA: Insufficient documentation

## 2017-08-03 DIAGNOSIS — E1142 Type 2 diabetes mellitus with diabetic polyneuropathy: Secondary | ICD-10-CM | POA: Insufficient documentation

## 2017-08-03 DIAGNOSIS — Z833 Family history of diabetes mellitus: Secondary | ICD-10-CM | POA: Insufficient documentation

## 2017-08-31 ENCOUNTER — Encounter: Payer: Self-pay | Admitting: Physical Medicine & Rehabilitation

## 2017-08-31 ENCOUNTER — Encounter: Payer: Medicare HMO | Admitting: Physical Medicine & Rehabilitation

## 2017-08-31 ENCOUNTER — Other Ambulatory Visit: Payer: Self-pay

## 2017-08-31 VITALS — BP 154/85 | HR 93

## 2017-08-31 DIAGNOSIS — K219 Gastro-esophageal reflux disease without esophagitis: Secondary | ICD-10-CM | POA: Diagnosis not present

## 2017-08-31 DIAGNOSIS — E1122 Type 2 diabetes mellitus with diabetic chronic kidney disease: Secondary | ICD-10-CM | POA: Diagnosis not present

## 2017-08-31 DIAGNOSIS — G479 Sleep disorder, unspecified: Secondary | ICD-10-CM | POA: Diagnosis not present

## 2017-08-31 DIAGNOSIS — Z940 Kidney transplant status: Secondary | ICD-10-CM | POA: Diagnosis not present

## 2017-08-31 DIAGNOSIS — Z808 Family history of malignant neoplasm of other organs or systems: Secondary | ICD-10-CM | POA: Diagnosis not present

## 2017-08-31 DIAGNOSIS — I120 Hypertensive chronic kidney disease with stage 5 chronic kidney disease or end stage renal disease: Secondary | ICD-10-CM | POA: Diagnosis present

## 2017-08-31 DIAGNOSIS — E785 Hyperlipidemia, unspecified: Secondary | ICD-10-CM | POA: Diagnosis not present

## 2017-08-31 DIAGNOSIS — N186 End stage renal disease: Secondary | ICD-10-CM | POA: Diagnosis not present

## 2017-08-31 DIAGNOSIS — E1142 Type 2 diabetes mellitus with diabetic polyneuropathy: Secondary | ICD-10-CM

## 2017-08-31 DIAGNOSIS — Z8619 Personal history of other infectious and parasitic diseases: Secondary | ICD-10-CM | POA: Diagnosis not present

## 2017-08-31 DIAGNOSIS — Z9851 Tubal ligation status: Secondary | ICD-10-CM | POA: Diagnosis not present

## 2017-08-31 DIAGNOSIS — Z833 Family history of diabetes mellitus: Secondary | ICD-10-CM | POA: Diagnosis not present

## 2017-08-31 DIAGNOSIS — Z9889 Other specified postprocedural states: Secondary | ICD-10-CM | POA: Diagnosis not present

## 2017-08-31 DIAGNOSIS — M109 Gout, unspecified: Secondary | ICD-10-CM | POA: Diagnosis not present

## 2017-08-31 DIAGNOSIS — E039 Hypothyroidism, unspecified: Secondary | ICD-10-CM | POA: Diagnosis not present

## 2017-08-31 MED ORDER — PREGABALIN 150 MG PO CAPS: 150.0000 mg | ORAL_CAPSULE | ORAL | 1 refills | Status: DC | PRN

## 2017-08-31 NOTE — Progress Notes (Addendum)
Subjective:    Patient ID: Wanda [name], female    DOB: October 18, 1960, 56 y.o.   MRN: [id number]  HPI 56 y/o female with pmh/psh of ESRD on HD, hypotension, migraines, DM with neuropathy, renal transplant, gastric sleeve, presents for follow up for diabetic neuropathy.   Initially stated: Started ~4 years ago after dialysis, getting worse. Located mid-shins distally and hands.  Worst on dialysis days.  Rest improves the pain.  Laying down exacerbates the pain.  Pin/needles.  Constant. Lyrica provides temporary benefit.  Gabapentin does not help.  Capsaisin does not help.  Denies associated weakness.  Denies falls. Makes it difficult to sleep.    Last clinic visit 07/06/17.  Since last visit, we have still not been able to locate her labs.  She continues with the Lyrica.  She states she did not receive follow up for TENS.  She has not had a chance to follow up with OT.  She states she went to [facility] for the flue, but no documentation found.  She is tolerating Nortriptyline.  Overall, she states she is doing well, her main complaint is anxiety at present.   Pain Inventory Average Pain 6 Pain Right Now 7 My pain is sharp, stabbing and aching  In the last 24 hours, has pain interfered with the following? General activity 4 Relation with others 6 Enjoyment of life 7 What TIME of day is your pain at its worst? night, night Sleep (in general) Poor  Pain is worse with: unsure Pain improves with: rest and medication Relief from Meds: 5  Mobility walk without assistance ability to climb steps?  yes do you drive?  yes Do you have any goals in this area?  yes  Function disabled: date disabled . Do you have any goals in this area?  yes  Neuro/Psych numbness tingling anxiety  Prior Studies Any changes since last visit?  no  Physicians involved in your care Any changes since last visit?  no   Family History  Problem Relation Age of Onset  . Colon cancer Father    deceased at age 28  . Diabetes Brother   . Neuropathy Neg Hx    Social History   Socioeconomic History  . Marital status: Married    Spouse name: [name]  . Number of children: 2  . Years of education: 53  . Highest education level: None  Social Needs  . Financial resource strain: None  . Food insecurity - worry: None  . Food insecurity - inability: None  . Transportation needs - medical: None  . Transportation needs - non-medical: None  Occupational History  . Occupation: Retired- disabled  Tobacco Use  . Smoking status: Never Smoker  . Smokeless tobacco: Never Used  Substance and Sexual Activity  . Alcohol use: No  . Drug use: No  . Sexual activity: Yes  Other Topics Concern  . None  Social History Narrative   Lives with husband and child.   Caffeine use: none   Past Surgical History:  Procedure Laterality Date  . AV FISTULA PLACEMENT Right 2000   "lower arm" (07/24/2013)  . AV FISTULA PLACEMENT, RADIOCEPHALIC Right 2505   "upper arm" (07/24/2013)  . CAPD REMOVAL N/A 10/10/2016   Procedure: CONTINUOUS AMBULATORY PERITONEAL DIALYSIS  (CAPD) CATHETER REMOVAL;  Surgeon: Donnie Mesa, MD;  Location: [place];  Service: General;  Laterality: N/A;  . CATARACT EXTRACTION W/ INTRAOCULAR LENS  IMPLANT, BILATERAL Bilateral 2011  . [place]; 1987  .  CHOLECYSTECTOMY  2011  . COLONOSCOPY    . ENDOMETRIAL CRYOABLATION  1980's  . INTRAUTERINE DEVICE (IUD) INSERTION  2011  . LAPAROSCOPIC GASTRIC SLEEVE RESECTION N/A 05/26/2015   Procedure: LAPAROSCOPIC GASTRIC SLEEVE RESECTION;  Surgeon: Excell Seltzer, MD;  [facility];  Service: General;  Laterality: N/A;  . TRANSPLANTATION RENAL  2013   "right" (09/04/2012)  . TUBAL LIGATION  1987  . [place]   "RLE; stripped" (09/04/2012)  . VIDEO BRONCHOSCOPY  09/07/2012   Procedure: VIDEO BRONCHOSCOPY WITHOUT FLUORO;  Surgeon: Chesley Mires, MD;  Location: [facility] ENDOSCOPY;  Service: Cardiopulmonary;  Laterality:  Bilateral;   Past Medical History:  Diagnosis Date  . Anemia   . Arthritis    "hands" (07/24/2013) pt denies  . Chronic kidney disease, stage IV (severe) ([place])    "never went on dialysis" (07/24/2013)  . Diabetes mellitus type II   . Diabetic peripheral neuropathy ([place])   . Dialysis patient [facility])    Monday Wednesday Friday   . Dyspnea on exertion    pt denies  . Foot pain   . GERD (gastroesophageal reflux disease)   . Gout   . History of chicken pox   . History of measles   . History of mumps   . Hyperlipidemia   . Hypothyroidism   . Migraines    "q 3 months" (07/24/2013)  . Nonspecific abnormal unspecified cardiovascular function study   . Obesity   . PAD (peripheral artery disease) ([place])   . Peripheral neuropathy   . Peripheral vascular disease ([place])   . Pneumonia 09/2012  . Renal transplant disorder   . Shingles   . Sleep apnea    "getting ready to get tested again cause dr says I have it" (07/24/2013) pt states was tested and was told she did not have sleep apnea / no cpap  . Unspecified essential hypertension    BP (!) 154/85   Pulse 93   SpO2 99%   Opioid Risk Score:   Fall Risk Score:  `1  Depression screen PHQ 2/9  Depression screen [facility] 2/9 08/31/2017 08/18/2015 07/14/2015 04/27/2015  Decreased Interest 0 0 0 0  Down, Depressed, Hopeless 0 0 0 0  PHQ - 2 Score 0 0 0 0    Review of Systems  Constitutional: Positive for diaphoresis and unexpected weight change.  HENT: Negative.   Eyes: Negative.   Respiratory: Positive for cough.   Cardiovascular: Negative.   Gastrointestinal: Positive for constipation.  Endocrine:       High, low blood sugar  Genitourinary: Negative.   Musculoskeletal: Positive for arthralgias, myalgias, neck pain and neck stiffness.  Skin: Negative.   Allergic/Immunologic: Negative.   Neurological: Positive for weakness and numbness.       Dysesthesias, neuropathy, tingling  Hematological: Negative.   Psychiatric/Behavioral:  The patient is nervous/anxious.   All other systems reviewed and are negative.      Objective:   Physical Exam Gen: NAD. Vital signs reviewed HENT: Normocephalic, Atraumatic Eyes: EOMI. No discharge.  Cardio: RRR. No JVD. Pulm: B/l clear to auscultation.  Effort normal. Abd: Soft, BS+ MSK:  Gait WNL.   No TTP.   Neuro:   Sensation diminished to light touch in b/l feet  Strength  5/5 in all LE myotomes Skin: Warm and Dry. Intact    Assessment & Plan:  56 y/o female with pmh/psh of ESRD on HD, hypotension, migraines, DM with neuropathy, renal transplant, gastric sleeve, presents for follow up for  diabetic neuropathy.    1. Diabetic neuropathy  Heat/Cold, Gabapentin, Lidoderm patches, Voltaren gel, Capsaicin, Elavil ineffective  Side effects with Gabapentin  Pamelor 25mg  twice/day  Vit B12, Vit D ordered, not in chart, pt states she had it done, will inquire again, otherwise reorder if still unable to locate  Cont Lyrica to 75 AM and 150 after HD  Awaiting trial of OT baths, pt states she needs to call back  Never received a call regarding TENS IT, will follow up  Encouraged trial Epson salt baths   2. Sleep disturbance  Poor on dialysis days  Cont Nortriptyline

## 2017-08-31 NOTE — Addendum Note (Signed)
Addended by: Caro Hight on: 08/31/2017 11:21 AM   Modules accepted: Orders

## 2017-08-31 NOTE — Addendum Note (Signed)
Addended by: Caro Hight on: 08/31/2017 11:06 AM   Modules accepted: Orders

## 2017-10-12 ENCOUNTER — Other Ambulatory Visit: Payer: Self-pay

## 2017-10-12 ENCOUNTER — Encounter: Payer: Self-pay | Admitting: Physical Medicine & Rehabilitation

## 2017-10-12 ENCOUNTER — Encounter: Payer: Medicare HMO | Attending: Physical Medicine & Rehabilitation | Admitting: Physical Medicine & Rehabilitation

## 2017-10-12 VITALS — BP 172/96 | HR 86

## 2017-10-12 DIAGNOSIS — Z940 Kidney transplant status: Secondary | ICD-10-CM | POA: Diagnosis not present

## 2017-10-12 DIAGNOSIS — E1122 Type 2 diabetes mellitus with diabetic chronic kidney disease: Secondary | ICD-10-CM | POA: Diagnosis not present

## 2017-10-12 DIAGNOSIS — E785 Hyperlipidemia, unspecified: Secondary | ICD-10-CM | POA: Insufficient documentation

## 2017-10-12 DIAGNOSIS — R030 Elevated blood-pressure reading, without diagnosis of hypertension: Secondary | ICD-10-CM

## 2017-10-12 DIAGNOSIS — N186 End stage renal disease: Secondary | ICD-10-CM | POA: Diagnosis not present

## 2017-10-12 DIAGNOSIS — E559 Vitamin D deficiency, unspecified: Secondary | ICD-10-CM | POA: Diagnosis not present

## 2017-10-12 DIAGNOSIS — Z9889 Other specified postprocedural states: Secondary | ICD-10-CM | POA: Diagnosis not present

## 2017-10-12 DIAGNOSIS — Z9851 Tubal ligation status: Secondary | ICD-10-CM | POA: Insufficient documentation

## 2017-10-12 DIAGNOSIS — M109 Gout, unspecified: Secondary | ICD-10-CM | POA: Insufficient documentation

## 2017-10-12 DIAGNOSIS — Z833 Family history of diabetes mellitus: Secondary | ICD-10-CM | POA: Insufficient documentation

## 2017-10-12 DIAGNOSIS — I120 Hypertensive chronic kidney disease with stage 5 chronic kidney disease or end stage renal disease: Secondary | ICD-10-CM | POA: Insufficient documentation

## 2017-10-12 DIAGNOSIS — K219 Gastro-esophageal reflux disease without esophagitis: Secondary | ICD-10-CM | POA: Diagnosis not present

## 2017-10-12 DIAGNOSIS — G479 Sleep disorder, unspecified: Secondary | ICD-10-CM | POA: Insufficient documentation

## 2017-10-12 DIAGNOSIS — Z808 Family history of malignant neoplasm of other organs or systems: Secondary | ICD-10-CM | POA: Diagnosis not present

## 2017-10-12 DIAGNOSIS — Z8619 Personal history of other infectious and parasitic diseases: Secondary | ICD-10-CM | POA: Insufficient documentation

## 2017-10-12 DIAGNOSIS — E1142 Type 2 diabetes mellitus with diabetic polyneuropathy: Secondary | ICD-10-CM | POA: Insufficient documentation

## 2017-10-12 DIAGNOSIS — E039 Hypothyroidism, unspecified: Secondary | ICD-10-CM | POA: Insufficient documentation

## 2017-10-12 MED ORDER — NORTRIPTYLINE HCL 25 MG PO CAPS: 25.0000 mg | ORAL_CAPSULE | Freq: Every day | ORAL | 1 refills | Status: DC

## 2017-10-12 MED ORDER — PREGABALIN 150 MG PO CAPS: 150.0000 mg | ORAL_CAPSULE | ORAL | 1 refills | Status: DC | PRN

## 2017-10-12 MED ORDER — PREGABALIN 75 MG PO CAPS: 75.0000 mg | ORAL_CAPSULE | Freq: Every day | ORAL | 1 refills | Status: DC

## 2017-10-12 NOTE — Progress Notes (Signed)
Subjective:    Patient ID: Wanda [name], female    DOB: 27-Sep-1961, 57 y.o.   MRN: [id number]  HPI 57 y/o female with pmh/psh of ESRD on HD, hypotension, migraines, DM with neuropathy, renal transplant, gastric sleeve, presents for follow up for diabetic/HD associated neuropathy.   Initially stated: Started ~4 years ago after dialysis, getting worse. Located mid-shins distally and hands.  Worst on dialysis days.  Rest improves the pain.  Laying down exacerbates the pain.  Pin/needles.  Constant. Lyrica provides temporary benefit.  Gabapentin does not help.  Capsaisin does not help.  Denies associated weakness.  Denies falls. Makes it difficult to sleep.    Last clinic visit 08/31/17.  Since that time, pt states she has not drawn lab work.  She states she never received a call back regarding baths or TENS unit. She tried Epson salt baths with some benefit. She noticed some benefit with increase in Nortriptyline.   Pain Inventory Average Pain 8 Pain Right Now 7 My pain is sharp, burning, stabbing, tingling and aching  In the last 24 hours, has pain interfered with the following? General activity 5 Relation with others 6 Enjoyment of life 6 What TIME of day is your pain at its worst? daytime, evening and night, night Sleep (in general) Poor  Pain is worse with: sitting and some activites Pain improves with: medication Relief from Meds: 7  Mobility walk without assistance how many minutes can you walk? [name] ability to climb steps?  no do you drive?  yes Do you have any goals in this area?  yes  Function disabled: date disabled 2015 Do you have any goals in this area?  yes  Neuro/Psych weakness tingling dizziness anxiety  Prior Studies Any changes since last visit?  no  Physicians involved in your care Any changes since last visit?  yes   Family History  Problem Relation Age of Onset  . Colon cancer Father        deceased at age 58  . Diabetes Brother   .  Neuropathy Neg Hx    Social History   Socioeconomic History  . Marital status: Married    Spouse name: Wanda [name]  . Number of children: 2  . Years of education: 56  . Highest education level: None  Social Needs  . Financial resource strain: None  . Food insecurity - worry: None  . Food insecurity - inability: None  . Transportation needs - medical: None  . Transportation needs - non-medical: None  Occupational History  . Occupation: Retired- disabled  Tobacco Use  . Smoking status: Never Smoker  . Smokeless tobacco: Never Used  Substance and Sexual Activity  . Alcohol use: No  . Drug use: No  . Sexual activity: Yes  Other Topics Concern  . None  Social History Narrative   Lives with husband and child.   Caffeine use: none   Past Surgical History:  Procedure Laterality Date  . AV FISTULA PLACEMENT Right 2000   "lower arm" (07/24/2013)  . AV FISTULA PLACEMENT, RADIOCEPHALIC Right 0998   "upper arm" (07/24/2013)  . CAPD REMOVAL N/A 10/10/2016   Procedure: CONTINUOUS AMBULATORY PERITONEAL DIALYSIS  (CAPD) CATHETER REMOVAL;  Surgeon: Donnie Mesa, MD;  Location: [place];  Service: General;  Laterality: N/A;  . CATARACT EXTRACTION W/ INTRAOCULAR LENS  IMPLANT, BILATERAL Bilateral 2011  . [place]; 1987  . CHOLECYSTECTOMY  2011  . COLONOSCOPY    . ENDOMETRIAL CRYOABLATION  1980's  .  INTRAUTERINE DEVICE (IUD) INSERTION  2011  . LAPAROSCOPIC GASTRIC SLEEVE RESECTION N/A 05/26/2015   Procedure: LAPAROSCOPIC GASTRIC SLEEVE RESECTION;  Surgeon: Excell Seltzer, MD;  [facility];  Service: General;  Laterality: N/A;  . TRANSPLANTATION RENAL  2013   "right" (09/04/2012)  . TUBAL LIGATION  1987  . [place]   "RLE; stripped" (09/04/2012)  . VIDEO BRONCHOSCOPY  09/07/2012   Procedure: VIDEO BRONCHOSCOPY WITHOUT FLUORO;  Surgeon: Chesley Mires, MD;  Location: [facility] ENDOSCOPY;  Service: Cardiopulmonary;  Laterality: Bilateral;   Past Medical History:    Diagnosis Date  . Anemia   . Arthritis    "hands" (07/24/2013) pt denies  . Chronic kidney disease, stage IV (severe) ([place])    "never went on dialysis" (07/24/2013)  . Diabetes mellitus type II   . Diabetic peripheral neuropathy ([place])   . Dialysis patient [facility])    Monday Wednesday Friday   . Dyspnea on exertion    pt denies  . Foot pain   . GERD (gastroesophageal reflux disease)   . Gout   . History of chicken pox   . History of measles   . History of mumps   . Hyperlipidemia   . Hypothyroidism   . Migraines    "q 3 months" (07/24/2013)  . Nonspecific abnormal unspecified cardiovascular function study   . Obesity   . PAD (peripheral artery disease) ([place])   . Peripheral neuropathy   . Peripheral vascular disease ([place])   . Pneumonia 09/2012  . Renal transplant disorder   . Shingles   . Sleep apnea    "getting ready to get tested again cause dr says I have it" (07/24/2013) pt states was tested and was told she did not have sleep apnea / no cpap  . Unspecified essential hypertension    BP (!) 172/96   Pulse 86   SpO2 99%   Opioid Risk Score:  0 Fall Risk Score:  `1  Depression screen PHQ 2/9  Depression screen [facility] 2/9 10/12/2017 08/31/2017 08/18/2015 07/14/2015 04/27/2015  Decreased Interest 0 0 0 0 0  Down, Depressed, Hopeless 0 0 0 0 0  PHQ - 2 Score 0 0 0 0 0    Review of Systems  Constitutional: Positive for unexpected weight change.       Low blood sugar  HENT: Negative.   Eyes: Negative.   Respiratory: Negative.   Cardiovascular: Negative.   Gastrointestinal: Positive for constipation and nausea.  Endocrine: Negative.        High, low blood sugar  Genitourinary: Negative.   Musculoskeletal: Positive for arthralgias, myalgias, neck pain and neck stiffness.  Skin: Negative.   Allergic/Immunologic: Negative.   Neurological: Positive for weakness and numbness.       Dysesthesias, neuropathy, tingling  Hematological: Negative.   Psychiatric/Behavioral:  The patient is nervous/anxious.   All other systems reviewed and are negative.     Objective:   Physical Exam Gen: NAD. Vital signs reviewed HENT: Normocephalic, Atraumatic Eyes: EOMI. No discharge.  Cardio: RRR. No JVD. Pulm: B/l clear to auscultation.  Effort normal. Abd: Soft, BS+ MSK:  Gait WNL.   No TTP.   Neuro:   Sensation diminished to light touch in b/l feet  Strength  5/5 in all LE myotomes Skin: Warm and Dry. Intact    Assessment & Plan:  57 y/o female with pmh/psh of ESRD on HD, hypotension, migraines, DM with neuropathy, renal transplant, gastric sleeve, presents for follow up for diabetic/HD associated neuropathy.  1. Diabetic neuropathy  Heat/Cold, Gabapentin, Lidoderm patches, Voltaren gel, Capsaicin, Elavil ineffective  Side effects with Gabapentin  Pamelor 25mg  BID, pt is only taking daily  Vit B12, Vit D ordered, pt still has not had it done  Cont Lyrica to 75 AM and 150 after HD  Awaiting trial of OT baths, pt states she never received call back  Ordered TENS IT, pt states she is awaiting call back, will follow up with rep  Will consider trial of Cymbalta   2. Sleep disturbance  Poor on dialysis days  Cont Nortriptyline   3. Elevated BP  States it is usually WNL, but states she had a poor breakfast this AM  4. Vitamin D deficiency  Labs ordered

## 2017-10-16 ENCOUNTER — Emergency Department (HOSPITAL_COMMUNITY): Payer: Medicare HMO

## 2017-10-16 ENCOUNTER — Encounter (HOSPITAL_COMMUNITY): Payer: Self-pay | Admitting: Emergency Medicine

## 2017-10-16 ENCOUNTER — Emergency Department (HOSPITAL_COMMUNITY)
Admission: EM | Admit: 2017-10-16 | Discharge: 2017-10-16 | Disposition: A | Discharge status: Home / Self Care | Payer: Medicare HMO | Attending: Emergency Medicine | Admitting: Emergency Medicine

## 2017-10-16 DIAGNOSIS — Z992 Dependence on renal dialysis: Secondary | ICD-10-CM | POA: Insufficient documentation

## 2017-10-16 DIAGNOSIS — Y92538 Other ambulatory health services establishments as the place of occurrence of the external cause: Secondary | ICD-10-CM | POA: Insufficient documentation

## 2017-10-16 DIAGNOSIS — Y9389 Activity, other specified: Secondary | ICD-10-CM | POA: Diagnosis not present

## 2017-10-16 DIAGNOSIS — M545 Low back pain, unspecified: Secondary | ICD-10-CM

## 2017-10-16 DIAGNOSIS — W0110XA Fall on same level from slipping, tripping and stumbling with subsequent striking against unspecified object, initial encounter: Secondary | ICD-10-CM | POA: Diagnosis not present

## 2017-10-16 DIAGNOSIS — Z9884 Bariatric surgery status: Secondary | ICD-10-CM | POA: Diagnosis not present

## 2017-10-16 DIAGNOSIS — Z79899 Other long term (current) drug therapy: Secondary | ICD-10-CM | POA: Insufficient documentation

## 2017-10-16 DIAGNOSIS — E039 Hypothyroidism, unspecified: Secondary | ICD-10-CM | POA: Insufficient documentation

## 2017-10-16 DIAGNOSIS — Z794 Long term (current) use of insulin: Secondary | ICD-10-CM | POA: Insufficient documentation

## 2017-10-16 DIAGNOSIS — N186 End stage renal disease: Secondary | ICD-10-CM | POA: Diagnosis not present

## 2017-10-16 DIAGNOSIS — R55 Syncope and collapse: Secondary | ICD-10-CM

## 2017-10-16 DIAGNOSIS — Z9049 Acquired absence of other specified parts of digestive tract: Secondary | ICD-10-CM | POA: Insufficient documentation

## 2017-10-16 DIAGNOSIS — I120 Hypertensive chronic kidney disease with stage 5 chronic kidney disease or end stage renal disease: Secondary | ICD-10-CM | POA: Insufficient documentation

## 2017-10-16 DIAGNOSIS — E162 Hypoglycemia, unspecified: Secondary | ICD-10-CM

## 2017-10-16 DIAGNOSIS — S161XXA Strain of muscle, fascia and tendon at neck level, initial encounter: Secondary | ICD-10-CM | POA: Diagnosis not present

## 2017-10-16 DIAGNOSIS — S0990XA Unspecified injury of head, initial encounter: Secondary | ICD-10-CM | POA: Diagnosis present

## 2017-10-16 DIAGNOSIS — Y998 Other external cause status: Secondary | ICD-10-CM | POA: Diagnosis not present

## 2017-10-16 DIAGNOSIS — E119 Type 2 diabetes mellitus without complications: Secondary | ICD-10-CM | POA: Diagnosis not present

## 2017-10-16 LAB — CBC WITH DIFFERENTIAL/PLATELET
Basophils Absolute: 0 10*3/uL (ref 0.0–0.1)
Basophils Relative: 0 %
Eosinophils Absolute: 0.3 10*3/uL (ref 0.0–0.7)
Eosinophils Relative: 2 %
HCT: 37.9 % (ref 36.0–46.0)
Hemoglobin: 12.8 g/dL (ref 12.0–15.0)
Lymphocytes Relative: 13 %
Lymphs Abs: 1.5 10*3/uL (ref 0.7–4.0)
MCH: 31.1 pg (ref 26.0–34.0)
MCHC: 33.8 g/dL (ref 30.0–36.0)
MCV: 92 fL (ref 78.0–100.0)
Monocytes Absolute: 0.6 10*3/uL (ref 0.1–1.0)
Monocytes Relative: 5 %
Neutro Abs: 9.1 10*3/uL — ABNORMAL HIGH (ref 1.7–7.7)
Neutrophils Relative %: 80 %
Platelets: 202 10*3/uL (ref 150–400)
RBC: 4.12 MIL/uL (ref 3.87–5.11)
RDW: 13.9 % (ref 11.5–15.5)
WBC: 11.5 10*3/uL — ABNORMAL HIGH (ref 4.0–10.5)

## 2017-10-16 LAB — COMPREHENSIVE METABOLIC PANEL
ALT: 15 U/L (ref 14–54)
AST: 19 U/L (ref 15–41)
Albumin: 3.5 g/dL (ref 3.5–5.0)
Alkaline Phosphatase: 107 U/L (ref 38–126)
Anion gap: 15 (ref 5–15)
BUN: 17 mg/dL (ref 6–20)
CO2: 27 mmol/L (ref 22–32)
Calcium: 8.8 mg/dL — ABNORMAL LOW (ref 8.9–10.3)
Chloride: 93 mmol/L — ABNORMAL LOW (ref 101–111)
Creatinine, Ser: 5.98 mg/dL — ABNORMAL HIGH (ref 0.44–1.00)
GFR calc Af Amer: 8 mL/min — ABNORMAL LOW (ref >60)
GFR calc non Af Amer: 7 mL/min — ABNORMAL LOW (ref >60)
Glucose, Bld: 43 mg/dL — CL (ref 65–99)
Potassium: 3.4 mmol/L — ABNORMAL LOW (ref 3.5–5.1)
Sodium: 135 mmol/L (ref 135–145)
Total Bilirubin: 0.4 mg/dL (ref 0.3–1.2)
Total Protein: 7.9 g/dL (ref 6.5–8.1)

## 2017-10-16 LAB — CBG MONITORING, ED
Glucose-Capillary: 75 mg/dL (ref 65–99)
Glucose-Capillary: 94 mg/dL (ref 65–99)

## 2017-10-16 MED ORDER — IBUPROFEN 400 MG PO TABS
600.0000 mg | ORAL_TABLET | Freq: Once | ORAL | Status: AC
  Administered 2017-10-16: 600 mg via ORAL
  Filled 2017-10-16: qty 1

## 2017-10-16 NOTE — ED Notes (Signed)
Critical Lab value:   Glucose 43, provider aware, pt had already been given food and drink since labs were drawn and sugar was rechecked

## 2017-10-16 NOTE — Discharge Instructions (Signed)
As we discussed, we believe her symptoms are caused today by mild volume depletion, or mild dehydration, without any evidence of damage to your body.  Please drink plenty of clear fluids such as water and/or Gatorade and follow up with your regular doctor or the doctors listed in his documentation at the next available opportunity.  Return to the emergency department with any new or worsening symptoms that concern you, including but not limited to fever, shortness of breath, chest pain, or other concerning symptoms. ° ° °Dehydration, Adult °Dehydration is when you lose more fluids from the body than you take in. Vital organs like the kidneys, brain, and heart cannot function without a proper amount of fluids and salt. Any loss of fluids from the body can cause dehydration.  °CAUSES  °Vomiting. °Diarrhea. °Excessive sweating. °Excessive urine output. °Fever. °SYMPTOMS  °Mild dehydration °Thirst. °Dry lips. °Slightly dry mouth. °Moderate dehydration °Very dry mouth. °Sunken eyes. °Skin does not bounce back quickly when lightly pinched and released. °Dark urine and decreased urine production. °Decreased tear production. °Headache. °Severe dehydration °Very dry mouth. °Extreme thirst. °Rapid, weak pulse (more than 100 beats per minute at rest). °Cold hands and feet. °Not able to sweat in spite of heat and temperature. °Rapid breathing. °Blue lips. °Confusion and lethargy. °Difficulty being awakened. °Minimal urine production. °No tears. °DIAGNOSIS  °Your caregiver will diagnose dehydration based on your symptoms and your exam. Blood and urine tests will help confirm the diagnosis. The diagnostic evaluation should also identify the cause of dehydration. °TREATMENT  °Treatment of mild or moderate dehydration can often be done at home by increasing the amount of fluids that you drink. It is best to drink small amounts of fluid more often. Drinking too much at one time can make vomiting worse. Refer to the home care  instructions below. °Severe dehydration needs to be treated at the hospital where you will probably be given intravenous (IV) fluids that contain water and electrolytes. °HOME CARE INSTRUCTIONS  °Ask your caregiver about specific rehydration instructions. °Drink enough fluids to keep your urine clear or pale yellow. °Drink small amounts frequently if you have nausea and vomiting. °Eat as you normally do. °Avoid: °Foods or drinks high in sugar. °Carbonated drinks. °Juice. °Extremely hot or cold fluids. °Drinks with caffeine. °Fatty, greasy foods. °Alcohol. °Tobacco. °Overeating. °Gelatin desserts. °Wash your hands well to avoid spreading bacteria and viruses. °Only take over-the-counter or prescription medicines for pain, discomfort, or fever as directed by your caregiver. °Ask your caregiver if you should continue all prescribed and over-the-counter medicines. °Keep all follow-up appointments with your caregiver. °SEEK MEDICAL CARE IF: °You have abdominal pain and it increases or stays in one area (localizes). °You have a rash, stiff neck, or severe headache. °You are irritable, sleepy, or difficult to awaken. °You are weak, dizzy, or extremely thirsty. °SEEK IMMEDIATE MEDICAL CARE IF:  °You are unable to keep fluids down or you get worse despite treatment. °You have frequent episodes of vomiting or diarrhea. °You have blood or green matter (bile) in your vomit. °You have blood in your stool or your stool looks black and tarry. °You have not urinated in 6 to 8 hours, or you have only urinated a small amount of very dark urine. °You have a fever. °You faint. °MAKE SURE YOU:  °Understand these instructions. °Will watch your condition. °Will get help right away if you are not doing well or get worse. °Document Released: 09/19/2005 Document Revised: 12/12/2011 Document Reviewed: 05/09/2011 °ExitCare® Patient Information ©2015   [employer]. This information is not intended to replace advice given to you by your health  care provider. Make sure you discuss any questions you have with your health care provider. ° °Rehydration, Adult °Rehydration is the replacement of body fluids lost during dehydration. Dehydration is an extreme loss of body fluids to the point of body function impairment. There are many ways extreme fluid loss can occur, including vomiting, diarrhea, or excess sweating. Recovering from dehydration requires replacing lost fluids, continuing to eat to maintain strength, and avoiding foods and beverages that may contribute to further fluid loss or may increase nausea. °HOW TO REHYDRATE °In most cases, rehydration involves the replacement of not only fluids but also carbohydrates and basic body salts. Rehydration with an oral rehydration solution is one way to replace essential nutrients lost through dehydration. °An oral rehydration solution can be purchased at pharmacies, retail stores, and online. Premixed packets of powder that you combine with water to make a solution are also sold. You can prepare an oral rehydration solution at home by mixing the following ingredients together:  ° - tsp table salt. °¾ tsp baking soda. ° tsp salt substitute containing potassium chloride. °1 tablespoons sugar. °1 L (34 oz) of water. °Be sure to use exact measurements. Including too much sugar can make diarrhea worse. °Drink ½-1 cup (120-240 mL) of oral rehydration solution each time you have diarrhea or vomit. If drinking this amount makes your vomiting worse, try drinking smaller amounts more often. For example, drink 1-3 tsp every 5-10 minutes.  °A general rule for staying hydrated is to drink 1½-2 L of fluid per day. Talk to your caregiver about the specific amount you should be drinking each day. Drink enough fluids to keep your urine clear or pale yellow. °EATING WHEN DEHYDRATED °Even if you have had severe sweating or you are having diarrhea, do not stop eating. Many healthy items in a normal diet are okay to continue eating  while recovering from dehydration. The following tips can help you to lessen nausea when you eat: °Ask someone else to prepare your food. Cooking smells may worsen nausea. °Eat in a well-ventilated room away from cooking smells. °Sit up when you eat. Avoid lying down until 1-2 hours after eating. °Eat small amounts when you eat. °Eat foods that are easy to digest. These include soft, well-cooked, or mashed foods. °FOODS AND BEVERAGES TO AVOID °Avoid eating or drinking the following foods and beverages that may increase nausea or further loss of fluid:  °Fruit juices with a high sugar content, such as concentrated juices. °Alcohol. °Beverages containing caffeine. °Carbonated drinks. They may cause a lot of gas. °Foods that may cause a lot of gas, such as cabbage, broccoli, and beans. °Fatty, greasy, and fried foods. °Spicy, very salty, and very sweet foods or drinks. °Foods or drinks that are very hot or very cold. Consume food or drinks at or near room temperature. °Foods that need a lot of chewing, such as raw vegetables. °Foods that are sticky or hard to swallow, such as peanut butter. °Document Released: 12/12/2011 Document Revised: 06/13/2012 Document Reviewed: 12/12/2011 °ExitCare® Patient Information ©2015 [employer]. This information is not intended to replace advice given to you by your health care provider. Make sure you discuss any questions you have with your health care provider. ° ° ° °

## 2017-10-16 NOTE — ED Triage Notes (Signed)
Per EMS: Pt was at dialysis today and had an unwitnessed fall due to low blood sugar. Pt has C-Collar in place due to neck pain.  Pt initial CBG was 24. Given 20g of D-50. CBG went up to 61. Pt then given 15 G oral glucose.  166/85 HR 96.

## 2017-10-16 NOTE — ED Notes (Signed)
Pt denies dizziness.

## 2017-10-16 NOTE — ED Provider Notes (Signed)
Emergency Department Provider Note   I have reviewed the triage vital signs and the nursing notes.   HISTORY  Chief Complaint Hypoglycemia   HPI Wanda FORMBY is a 57 y.o. female with PMH of ESRD, HLD, DM, and PAD presents to the emergency department for evaluation after a fall while at dialysis today.  Patient recalls completing dialysis but passed out.  She woke up on the floor and was found to have low blood sugar (24).  She denies any presyncopal symptoms.  Denies chest pain, patient's, shortness of breath.  When she fell she began having pain in her head, neck, lower back. Patient arrived with lower back pain. No numbness or tingling.    Past Medical History:  Diagnosis Date  . Anemia   . Arthritis    "hands" (07/24/2013) pt denies  . Chronic kidney disease, stage IV (severe) ([place])    "never went on dialysis" (07/24/2013)  . Diabetes mellitus type II   . Diabetic peripheral neuropathy ([place])   . Dialysis patient [facility])    Monday Wednesday Friday   . Dyspnea on exertion    pt denies  . Foot pain   . GERD (gastroesophageal reflux disease)   . Gout   . History of chicken pox   . History of measles   . History of mumps   . Hyperlipidemia   . Hypothyroidism   . Migraines    "q 3 months" (07/24/2013)  . Nonspecific abnormal unspecified cardiovascular function study   . Obesity   . PAD (peripheral artery disease) ([place])   . Peripheral neuropathy   . Peripheral vascular disease ([place])   . Pneumonia 09/2012  . Renal transplant disorder   . Shingles   . Sleep apnea    "getting ready to get tested again cause dr says I have it" (07/24/2013) pt states was tested and was told she did not have sleep apnea / no cpap  . Unspecified essential hypertension     Patient Active Problem List   Diagnosis Date Noted  . Hypokalemia 10/07/2016  . Peritonitis ([place]) 10/07/2016  . SBP (spontaneous bacterial peritonitis) ([place]) 10/07/2016  . Morbid obesity ([place]) 05/26/2015  .  Hypoglycemia 07/24/2013  . Nausea and vomiting 01/08/2013  . Fever 01/08/2013  . Renal failure (ARF), acute on chronic (HCC) 01/08/2013  . Renal transplant disorder 09/04/2012  . Hyponatremia 09/04/2012  . CKD (chronic kidney disease) 09/04/2012  . HTN (hypertension) 09/04/2012  . Healthcare-associated pneumonia 09/04/2012  . Anemia 09/04/2012  . Cough 03/22/2011  . SHINGLES 12/21/2009  . HYPOTHYROIDISM 12/21/2009  . DM (diabetes mellitus), type 2 with renal complications ([place]) 50/53/9767  . HYPERLIPIDEMIA 12/21/2009  . OBESITY 12/21/2009  . DYSPNEA 12/21/2009  . MIGRAINES, HX OF 12/21/2009  . HYPERTENSION, UNSPECIFIED 11/20/2009  . CHRONIC KIDNEY DISEASE STAGE IV ([place]) 11/20/2009  . DYSPNEA ON EXERTION 11/20/2009  . NONSPECIFIC ABNORMAL UNSPEC CV FUNCTION STUDY 11/20/2009    Past Surgical History:  Procedure Laterality Date  . AV FISTULA PLACEMENT Right 2000   "lower arm" (07/24/2013)  . AV FISTULA PLACEMENT, RADIOCEPHALIC Right 3419   "upper arm" (07/24/2013)  . CAPD REMOVAL N/A 10/10/2016   Procedure: CONTINUOUS AMBULATORY PERITONEAL DIALYSIS  (CAPD) CATHETER REMOVAL;  Surgeon: Donnie Mesa, MD;  Location: [facility];  Service: General;  Laterality: N/A;  . CATARACT EXTRACTION W/ INTRAOCULAR LENS  IMPLANT, BILATERAL Bilateral 2011  . [place]; 1987  . CHOLECYSTECTOMY  2011  . COLONOSCOPY    . ENDOMETRIAL CRYOABLATION  1980's  . INTRAUTERINE DEVICE (IUD) INSERTION  2011  . LAPAROSCOPIC GASTRIC SLEEVE RESECTION N/A 05/26/2015   Procedure: LAPAROSCOPIC GASTRIC SLEEVE RESECTION;  Surgeon: Excell Seltzer, MD;  Location: [facility];  Service: General;  Laterality: N/A;  . TRANSPLANTATION RENAL  2013   "right" (09/04/2012)  . TUBAL LIGATION  1987  . [place]   "RLE; stripped" (09/04/2012)  . VIDEO BRONCHOSCOPY  09/07/2012   Procedure: VIDEO BRONCHOSCOPY WITHOUT FLUORO;  Surgeon: Chesley Mires, MD;  Location: [facility] ENDOSCOPY;  Service: Cardiopulmonary;   Laterality: Bilateral;    Current Outpatient Rx  . Order #: [id number] Class: Historical Med  . Order #: [id number] Class: Historical Med  . Order #: [id number] Class: Normal  . Order #: [id number] Class: Historical Med  . Order #: [id number] Class: Normal  . Order #: [id number] Class: Print  . Order #: [id number] Class: Print  . Order #: [id number] Class: Historical Med    Allergies Hydrocodone-acetaminophen; Propoxyphene n-acetaminophen; and Cephalosporins  Family History  Problem Relation Age of Onset  . Colon cancer Father        deceased at age 4  . Diabetes Brother   . Neuropathy Neg Hx     Social History Social History   Tobacco Use  . Smoking status: Never Smoker  . Smokeless tobacco: Never Used  Substance Use Topics  . Alcohol use: No  . Drug use: No    Review of Systems  Constitutional: No fever/chills Eyes: No visual changes. ENT: No sore throat. Cardiovascular: Denies chest pain. Positive syncope.  Respiratory: Denies shortness of breath. Gastrointestinal: No abdominal pain.  No nausea, no vomiting.  No diarrhea.  No constipation. Genitourinary: Negative for dysuria. Musculoskeletal: Positive for lower back pain. Skin: Negative for rash. Neurological: Negative for headaches, focal weakness or numbness.  10-point ROS otherwise negative.  ____________________________________________   PHYSICAL EXAM:  VITAL SIGNS: ED Triage Vitals  Enc Vitals Group     BP 10/16/17 1839 (!) 160/88     Pulse Rate 10/16/17 1839 96     Resp 10/16/17 1839 14     Temp 10/16/17 1839 97.8 F (36.6 C)     Temp Source 10/16/17 1839 Oral     SpO2 10/16/17 1839 100 %     Weight 10/16/17 1832 170 lb (77.1 kg)     Height 10/16/17 1832 5\' 6"  (1.676 m)     Pain Score 10/16/17 1832 9    Constitutional: Alert and oriented. Well appearing and in no acute distress. Eyes: Conjunctivae are normal. Head: Atraumatic. Nose: No congestion/rhinnorhea. Mouth/Throat: Mucous membranes are  slightly dry.  Neck: No stridor.  Cardiovascular: Normal rate, regular rhythm. Good peripheral circulation. Grossly normal heart sounds.   Respiratory: Normal respiratory effort.  No retractions. Lungs CTAB. Gastrointestinal: Soft and nontender. No distention.  Musculoskeletal: No lower extremity tenderness nor edema. No gross deformities of xtremities. Neurologic:  Normal speech and language. No gross focal neurologic deficits are appreciated.  Skin:  Skin is warm, dry and intact. No rash noted.  ____________________________________________   LABS (all labs ordered are listed, but only abnormal results are displayed)  Labs Reviewed  COMPREHENSIVE METABOLIC PANEL - Abnormal; Notable for the following components:      Result Value   Potassium 3.4 (*)    Chloride 93 (*)    Glucose, Bld 43 (*)    Creatinine, Ser 5.98 (*)    Calcium 8.8 (*)    GFR calc non Af Amer 7 (*)    GFR calc Af Amer 8 (*)  All other components within normal limits  CBC WITH DIFFERENTIAL/PLATELET - Abnormal; Notable for the following components:   WBC 11.5 (*)    Neutro Abs 9.1 (*)    All other components within normal limits  CBG MONITORING, ED  CBG MONITORING, ED   ____________________________________________  EKG   EKG Interpretation  Date/Time:  Monday October 16 2017 18:41:36 EST Ventricular Rate:  97 PR Interval:    QRS Duration: 106 QT Interval:  380 QTC Calculation: 483 R Axis:   67 Text Interpretation:  Sinus rhythm Prolonged PR interval Anterior infarct, old No STEMI.  Confirmed by Nanda Quinton [id number]) on 10/16/2017 8:13:01 PM       ____________________________________________  RADIOLOGY  Dg Chest 2 View  Result Date: 10/16/2017 CLINICAL DATA:  Patient with syncope. EXAM: CHEST  2 VIEW COMPARISON:  Chest radiograph 10/07/2016. FINDINGS: Monitoring leads overlie the patient. Stable cardiac and mediastinal contours. No consolidative pulmonary opacities. No pleural effusion or  pneumothorax. Osseous skeleton is unremarkable. Upper abdominal surgical clips. IMPRESSION: No acute cardiopulmonary process. Electronically Signed   By: Lovey Newcomer M.D.   On: 10/16/2017 20:41   Dg Lumbar Spine 2-3 Views  Result Date: 10/16/2017 CLINICAL DATA:  Pain across the back EXAM: LUMBAR SPINE - 2-3 VIEW COMPARISON:  CT abdomen pelvis 07/31/2016 FINDINGS: Surgical changes in the upper quadrants and right pelvis. Extensive vascular calcifications in the pelvis. 5 non rib-bearing lumbar type vertebra. Lumbar alignment within normal limits. Vertebral body heights and disc spaces are also within normal limits. IMPRESSION: No acute osseous abnormality. Electronically Signed   By: Donavan Foil M.D.   On: 10/16/2017 20:44   Ct Head Wo Contrast  Result Date: 10/16/2017 CLINICAL DATA:  Patient with unwitnessed fall.  Neck pain. EXAM: CT HEAD WITHOUT CONTRAST CT CERVICAL SPINE WITHOUT CONTRAST TECHNIQUE: Multidetector CT imaging of the head and cervical spine was performed following the standard protocol without intravenous contrast. Multiplanar CT image reconstructions of the cervical spine were also generated. COMPARISON:  Brain CT 09/01/2007. FINDINGS: CT HEAD FINDINGS Brain: Ventricles and sulci are appropriate for patient's age. No evidence for acute cortically based infarct, intracranial hemorrhage, mass lesion or mass-effect. Vascular: Internal carotid arterial vascular calcifications. Skull: Intact. Sinuses/Orbits: Paranasal sinuses are well aerated. Mucosal thickening sphenoid sinus and left maxillary sinus. Mastoid air cells are unremarkable. Orbits are unremarkable. Other: None. CT CERVICAL SPINE FINDINGS Alignment: Straightening of the normal cervical lordosis. Skull base and vertebrae: No acute fracture. No primary bone lesion or focal pathologic process. Soft tissues and spinal canal: No prevertebral fluid or swelling. No visible canal hematoma. Disc levels: Degenerative disc disease most  pronounced C5-6 and C6-7 with posterior disc herniations. No evidence for acute fracture. Upper chest: Unremarkable Other: None IMPRESSION: 1. No acute intracranial process. 2. No acute cervical spine fracture. Multilevel degenerative disc disease. Electronically Signed   By: Lovey Newcomer M.D.   On: 10/16/2017 20:29   Ct Cervical Spine Wo Contrast  Result Date: 10/16/2017 CLINICAL DATA:  Patient with unwitnessed fall.  Neck pain. EXAM: CT HEAD WITHOUT CONTRAST CT CERVICAL SPINE WITHOUT CONTRAST TECHNIQUE: Multidetector CT imaging of the head and cervical spine was performed following the standard protocol without intravenous contrast. Multiplanar CT image reconstructions of the cervical spine were also generated. COMPARISON:  Brain CT 09/01/2007. FINDINGS: CT HEAD FINDINGS Brain: Ventricles and sulci are appropriate for patient's age. No evidence for acute cortically based infarct, intracranial hemorrhage, mass lesion or mass-effect. Vascular: Internal carotid arterial vascular calcifications. Skull: Intact. Sinuses/Orbits: Paranasal  sinuses are well aerated. Mucosal thickening sphenoid sinus and left maxillary sinus. Mastoid air cells are unremarkable. Orbits are unremarkable. Other: None. CT CERVICAL SPINE FINDINGS Alignment: Straightening of the normal cervical lordosis. Skull base and vertebrae: No acute fracture. No primary bone lesion or focal pathologic process. Soft tissues and spinal canal: No prevertebral fluid or swelling. No visible canal hematoma. Disc levels: Degenerative disc disease most pronounced C5-6 and C6-7 with posterior disc herniations. No evidence for acute fracture. Upper chest: Unremarkable Other: None IMPRESSION: 1. No acute intracranial process. 2. No acute cervical spine fracture. Multilevel degenerative disc disease. Electronically Signed   By: Lovey Newcomer M.D.   On: 10/16/2017 20:29    ____________________________________________   PROCEDURES  Procedure(s) performed:    Procedures  None ____________________________________________   INITIAL IMPRESSION / ASSESSMENT AND PLAN / ED COURSE  Pertinent labs & imaging results that were available during my care of the patient were reviewed by me and considered in my medical decision making (see chart for details).  Patient with syncope event after HD with associated hypoglycemia. No presyncope symptoms. Patient given D50 and feeling better with EMS. Eating here on arrival. No concern for insulin admin error. Patient well appearing here. Given pain and fall plan for CT imaging of the head, neck, and lower back. Some posterior back/chest wall pain.   09:34 PM Patient observed in the emergency department and is eating.  Most recent blood sugar in the 90s.  She does have positive orthostatic vital signs but was asymptomatic during this procedure.  Blood pressures have been running elevated here and I suspect that the standing blood pressure may not be totally accurate.  Given that she was completely asymptomatic with standing and walking I am not going to repeat this.  Plan to have her follow with her primary care physician.  CT imaging and x-rays are normal.   At this time, I do not feel there is any life-threatening condition present. I have reviewed and discussed all results (EKG, imaging, lab, urine as appropriate), exam findings with patient. I have reviewed nursing notes and appropriate previous records.  I feel the patient is safe to be discharged home without further emergent workup. Discussed usual and customary return precautions. Patient and family (if present) verbalize understanding and are comfortable with this plan.  Patient will follow-up with their primary care provider. If they do not have a primary care provider, information for follow-up has been provided to them. All questions have been answered.  ____________________________________________  FINAL CLINICAL IMPRESSION(S) / ED DIAGNOSES  Final  diagnoses:  Hypoglycemia  Syncope, unspecified syncope type  Injury of head, initial encounter  Strain of neck muscle, initial encounter  Acute midline low back pain without sciatica    MEDICATIONS GIVEN DURING THIS VISIT:  Medications  ibuprofen (ADVIL,MOTRIN) tablet 600 mg (600 mg Oral Given 10/16/17 2109)    Note:  This document was prepared using Dragon voice recognition software and may include unintentional dictation errors.  Nanda Quinton, MD Emergency Medicine   Long, [facility], MD 10/16/17 2350

## 2017-10-26 ENCOUNTER — Encounter: Payer: Self-pay | Admitting: Podiatry

## 2017-10-26 ENCOUNTER — Ambulatory Visit: Payer: Medicare HMO | Admitting: Podiatry

## 2017-10-26 ENCOUNTER — Ambulatory Visit (INDEPENDENT_AMBULATORY_CARE_PROVIDER_SITE_OTHER): Payer: Medicare HMO

## 2017-10-26 DIAGNOSIS — E1142 Type 2 diabetes mellitus with diabetic polyneuropathy: Secondary | ICD-10-CM

## 2017-10-26 DIAGNOSIS — E1151 Type 2 diabetes mellitus with diabetic peripheral angiopathy without gangrene: Secondary | ICD-10-CM | POA: Diagnosis not present

## 2017-10-26 DIAGNOSIS — E119 Type 2 diabetes mellitus without complications: Secondary | ICD-10-CM | POA: Diagnosis not present

## 2017-10-26 DIAGNOSIS — B351 Tinea unguium: Secondary | ICD-10-CM | POA: Diagnosis not present

## 2017-10-26 DIAGNOSIS — L97521 Non-pressure chronic ulcer of other part of left foot limited to breakdown of skin: Secondary | ICD-10-CM | POA: Diagnosis not present

## 2017-10-26 MED ORDER — SILVER SULFADIAZINE 1 % EX CREA: TOPICAL_CREAM | CUTANEOUS | 0 refills | Status: DC

## 2017-10-26 NOTE — Progress Notes (Signed)
Subjective:  Patient ID: Wanda [name], female    DOB: 1961/05/10,  MRN: [id number]  Chief Complaint  Patient presents with  . Diabetes    NP Diabetic foot exam   57 y.o. female presents with the above complaint.  Type II diabetic with last A1c of 7.9.  On dialysis.  States that her nails are discolored and thick.  Complains of calluses to the forefoot bilaterally complains of left fourth toe discoloration states that she dropped lines in the foot.  Denies pain in her toes today Past Medical History:  Diagnosis Date  . Anemia   . Arthritis    "hands" (07/24/2013) pt denies  . Chronic kidney disease, stage IV (severe) ([place])    "never went on dialysis" (07/24/2013)  . Diabetes mellitus type II   . Diabetic peripheral neuropathy ([place])   . Dialysis patient [facility])    Monday Wednesday Friday   . Dyspnea on exertion    pt denies  . Foot pain   . GERD (gastroesophageal reflux disease)   . Gout   . History of chicken pox   . History of measles   . History of mumps   . Hyperlipidemia   . Hypothyroidism   . Migraines    "q 3 months" (07/24/2013)  . Nonspecific abnormal unspecified cardiovascular function study   . Obesity   . PAD (peripheral artery disease) ([place])   . Peripheral neuropathy   . Peripheral vascular disease ([place])   . Pneumonia 09/2012  . Renal transplant disorder   . Shingles   . Sleep apnea    "getting ready to get tested again cause dr says I have it" (07/24/2013) pt states was tested and was told she did not have sleep apnea / no cpap  . Unspecified essential hypertension    Past Surgical History:  Procedure Laterality Date  . AV FISTULA PLACEMENT Right 2000   "lower arm" (07/24/2013)  . AV FISTULA PLACEMENT, RADIOCEPHALIC Right 4431   "upper arm" (07/24/2013)  . CAPD REMOVAL N/A 10/10/2016   Procedure: CONTINUOUS AMBULATORY PERITONEAL DIALYSIS  (CAPD) CATHETER REMOVAL;  Surgeon: Donnie Mesa, MD;  Location: [place];  Service: General;  Laterality: N/A;  .  CATARACT EXTRACTION W/ INTRAOCULAR LENS  IMPLANT, BILATERAL Bilateral 2011  . [place]; 1987  . CHOLECYSTECTOMY  2011  . COLONOSCOPY    . ENDOMETRIAL CRYOABLATION  1980's  . INTRAUTERINE DEVICE (IUD) INSERTION  2011  . LAPAROSCOPIC GASTRIC SLEEVE RESECTION N/A 05/26/2015   Procedure: LAPAROSCOPIC GASTRIC SLEEVE RESECTION;  Surgeon: Excell Seltzer, MD;  [facility];  Service: General;  Laterality: N/A;  . TRANSPLANTATION RENAL  2013   "right" (09/04/2012)  . TUBAL LIGATION  1987  . [place]   "RLE; stripped" (09/04/2012)  . VIDEO BRONCHOSCOPY  09/07/2012   Procedure: VIDEO BRONCHOSCOPY WITHOUT FLUORO;  Surgeon: Chesley Mires, MD;  Location: [facility] ENDOSCOPY;  Service: Cardiopulmonary;  Laterality: Bilateral;    Current Outpatient Medications:  .  allopurinol (ZYLOPRIM) 300 MG tablet, Take by mouth., Disp: , Rfl:  .  hydrOXYzine (ATARAX/VISTARIL) 25 MG tablet, Take 25 mg by mouth 4 (four) times daily as needed for itching. , Disp: , Rfl: 10 .  insulin aspart (NOVOLOG FLEXPEN) 100 UNIT/ML FlexPen, Inject 10 Units into the skin 3 (three) times daily with meals., Disp: 15 mL, Rfl: 11 .  L-Methylfolate-Algae-B12-B6 (METANX) 3-90.314-2-35 MG CAPS, Take 1 tablet by mouth daily., Disp: , Rfl: 11 .  nortriptyline (PAMELOR) 25 MG capsule,  Take 1 capsule (25 mg total) by mouth at bedtime., Disp: 100 capsule, Rfl: 1 .  pregabalin (LYRICA) 150 MG capsule, Take 1 capsule (150 mg total) by mouth every dialysis. Take after HD, Disp: 12 capsule, Rfl: 1 .  pregabalin (LYRICA) 75 MG capsule, Take 1 capsule (75 mg total) by mouth daily., Disp: 30 capsule, Rfl: 1 .  sevelamer carbonate (RENVELA) 2.4 g PACK, Take 2.4 g by mouth See admin instructions. Sprinkle 1 packet (2.4 g) with every meal and with snacks, Disp: , Rfl:  .  silver sulfADIAZINE (SILVADENE) 1 % cream, Apply fingertip amount to wound daily., Disp: 50 g, Rfl: 0  Allergies  Allergen Reactions  .  Hydrocodone-Acetaminophen Other (See Comments)    Severe headache  . Propoxyphene N-Acetaminophen Other (See Comments)    Severe headache  . Cephalosporins Rash   Review of Systems all systems reviewed and negative except as noted in HPI Objective:   General AA&O x3. Normal mood and affect.  Vascular Dorsalis pedis pulses present 1+ bilaterally  Posterior tibial pulses absent bilaterally  Capillary refill normal to all digits. Pedal hair growth normal.  Neurologic Epicritic sensation present bilaterally. Protective sensation with 5.07 monofilament absent bilaterally. Vibratory sensation present bilaterally.  Dermatologic No open lesions. Interspaces clear of maceration.  Normal skin temperature and turgor. Nails: brittle, onychomycosis, thickening, elongation  0.5 x 0.5 ulceration noted to the left plantar fifth metatarsal.  No probe to bone.  No ascending cellulitis no warmth or erythema   Orthopedic: No history of amputation. MMT 5/5 in dorsiflexion, plantarflexion, inversion, and eversion. Normal lower extremity joint ROM without pain or crepitus.   Assessment & Plan:  Patient was evaluated and treated and all questions answered.  Diabetes with PAD, diabetic peripheral neuropathy, Onychomycosis -Educated on diabetic footcare. Diabetic risk level 3 -Nails x10 debrided sharply and manually with large nail nipper and rotary burr.  Procedure: Nail Debridement Rationale: Patient meets criteria for routine foot care due to class B findings, neuropathy Type of Debridement: manual, sharp debridement. Instrumentation: Nail nipper, rotary burr. Number of Nails: 10  L 5th Met Ulcer -Debrided as below. -Dressed with Silvadene and dry sterile dressing  -Educated to perform daily dressing changes -Rx Silvadene  Procedure: Excisional Debridement of Wound Rationale: Removal of non-viable soft tissue from the wound to promote healing.  Anesthesia: none Pre-Debridement Wound  Measurements: Hyperkeratotic cover Post-Debridement Wound Measurements:  0.5 x 0.5 x 0.1 Type of Debridement: Excisional Tissue Removed: Non-viable soft tissue Depth of Debridement: Subcu Instrumentation: 15 blade tissue nipper Technique: Sharp excisional debridement to bleeding, viable wound base.  Dressing: Dry, sterile, compression dressing. Disposition: Patient tolerated procedure well. Patient to return in 2 weeks for follow-up.  Return in about 2 weeks (around 11/09/2017) for Wound Care.

## 2017-11-09 ENCOUNTER — Encounter: Payer: Medicare HMO | Attending: Physical Medicine & Rehabilitation | Admitting: Physical Medicine & Rehabilitation

## 2017-11-09 ENCOUNTER — Ambulatory Visit: Payer: Medicare HMO | Admitting: Podiatry

## 2017-11-09 ENCOUNTER — Encounter: Payer: Self-pay | Admitting: Physical Medicine & Rehabilitation

## 2017-11-09 VITALS — BP 153/92 | HR 93

## 2017-11-09 DIAGNOSIS — E039 Hypothyroidism, unspecified: Secondary | ICD-10-CM | POA: Diagnosis not present

## 2017-11-09 DIAGNOSIS — E785 Hyperlipidemia, unspecified: Secondary | ICD-10-CM | POA: Insufficient documentation

## 2017-11-09 DIAGNOSIS — K219 Gastro-esophageal reflux disease without esophagitis: Secondary | ICD-10-CM | POA: Diagnosis not present

## 2017-11-09 DIAGNOSIS — N186 End stage renal disease: Secondary | ICD-10-CM | POA: Diagnosis not present

## 2017-11-09 DIAGNOSIS — Z940 Kidney transplant status: Secondary | ICD-10-CM | POA: Diagnosis not present

## 2017-11-09 DIAGNOSIS — E1122 Type 2 diabetes mellitus with diabetic chronic kidney disease: Secondary | ICD-10-CM | POA: Insufficient documentation

## 2017-11-09 DIAGNOSIS — Z8619 Personal history of other infectious and parasitic diseases: Secondary | ICD-10-CM | POA: Diagnosis not present

## 2017-11-09 DIAGNOSIS — M109 Gout, unspecified: Secondary | ICD-10-CM | POA: Insufficient documentation

## 2017-11-09 DIAGNOSIS — Z9889 Other specified postprocedural states: Secondary | ICD-10-CM | POA: Insufficient documentation

## 2017-11-09 DIAGNOSIS — Z808 Family history of malignant neoplasm of other organs or systems: Secondary | ICD-10-CM | POA: Insufficient documentation

## 2017-11-09 DIAGNOSIS — G479 Sleep disorder, unspecified: Secondary | ICD-10-CM | POA: Diagnosis not present

## 2017-11-09 DIAGNOSIS — E559 Vitamin D deficiency, unspecified: Secondary | ICD-10-CM | POA: Diagnosis not present

## 2017-11-09 DIAGNOSIS — E1142 Type 2 diabetes mellitus with diabetic polyneuropathy: Secondary | ICD-10-CM | POA: Diagnosis not present

## 2017-11-09 DIAGNOSIS — I120 Hypertensive chronic kidney disease with stage 5 chronic kidney disease or end stage renal disease: Secondary | ICD-10-CM | POA: Diagnosis present

## 2017-11-09 DIAGNOSIS — Z9851 Tubal ligation status: Secondary | ICD-10-CM | POA: Diagnosis not present

## 2017-11-09 DIAGNOSIS — Z833 Family history of diabetes mellitus: Secondary | ICD-10-CM | POA: Diagnosis not present

## 2017-11-09 MED ORDER — LIDOCAINE 5 % EX OINT: 1.0000 "application " | TOPICAL_OINTMENT | CUTANEOUS | 0 refills | Status: DC | PRN

## 2017-11-09 MED ORDER — PREGABALIN 75 MG PO CAPS: 75.0000 mg | ORAL_CAPSULE | Freq: Every day | ORAL | 1 refills | Status: DC

## 2017-11-09 MED ORDER — PREGABALIN 150 MG PO CAPS: 150.0000 mg | ORAL_CAPSULE | ORAL | 1 refills | Status: DC | PRN

## 2017-11-09 MED ORDER — NORTRIPTYLINE HCL 25 MG PO CAPS: 25.0000 mg | ORAL_CAPSULE | Freq: Every day | ORAL | 1 refills | Status: DC

## 2017-11-09 NOTE — Addendum Note (Signed)
Addended by: Delice Lesch A on: 11/09/2017 02:28 PM   Modules accepted: Orders

## 2017-11-09 NOTE — Addendum Note (Signed)
Addended by: Delice Lesch A on: 11/09/2017 02:14 PM   Modules accepted: Orders

## 2017-11-09 NOTE — Progress Notes (Signed)
Subjective:    Patient ID: Wanda [name], female    DOB: 1961/05/18, 57 y.o.   MRN: [id number]  HPI 56 y/o female with pmh/psh of ESRD on HD, hypotension, migraines, DM with neuropathy, renal transplant, gastric sleeve, presents for follow up for diabetic/HD associated neuropathy.   Initially stated: Started ~4 years ago after dialysis, getting worse. Located mid-shins distally and hands.  Worst on dialysis days.  Rest improves the pain.  Laying down exacerbates the pain.  Pin/needles.  Constant. Lyrica provides temporary benefit.  Gabapentin does not help.  Capsaisin does not help.  Denies associated weakness.  Denies falls. Makes it difficult to sleep.    Last clinic visit 10/12/17.  Since that time, pt went to the ED for syncope due to hypoglycemia, notes reviewed. Pt still has not had her blood drawn.  She states she still has not heard back from OT.  She states she never received a call back.  She never heard back regarding TENS either.   Pain Inventory Average Pain 6 Pain Right Now 7 My pain is sharp, burning, stabbing, tingling and aching  In the last 24 hours, has pain interfered with the following? General activity 4 Relation with others 5 Enjoyment of life 8 What TIME of day is your pain at its worst? daytime, evening and night, night Sleep (in general) Poor  Pain is worse with: sitting and some activites Pain improves with: medication Relief from Meds: 7  Mobility walk without assistance how many minutes can you walk? [name] Kitchen ability to climb steps?  no do you drive?  yes Do you have any goals in this area?  yes  Function disabled: date disabled 2015 Do you have any goals in this area?  yes  Neuro/Psych weakness tingling dizziness anxiety  Prior Studies Any changes since last visit?  no  Physicians involved in your care Any changes since last visit?  yes   Family History  Problem Relation Age of Onset  . Colon cancer Father        deceased at age 13  .  Diabetes Brother   . Neuropathy Neg Hx    Social History   Socioeconomic History  . Marital status: Married    Spouse name: [name]  . Number of children: 2  . Years of education: 41  . Highest education level: None  Social Needs  . Financial resource strain: None  . Food insecurity - worry: None  . Food insecurity - inability: None  . Transportation needs - medical: None  . Transportation needs - non-medical: None  Occupational History  . Occupation: Retired- disabled  Tobacco Use  . Smoking status: Never Smoker  . Smokeless tobacco: Never Used  Substance and Sexual Activity  . Alcohol use: No  . Drug use: No  . Sexual activity: Yes  Other Topics Concern  . None  Social History Narrative   Lives with husband and child.   Caffeine use: none   Past Surgical History:  Procedure Laterality Date  . AV FISTULA PLACEMENT Right 2000   "lower arm" (07/24/2013)  . AV FISTULA PLACEMENT, RADIOCEPHALIC Right 2683   "upper arm" (07/24/2013)  . CAPD REMOVAL N/A 10/10/2016   Procedure: CONTINUOUS AMBULATORY PERITONEAL DIALYSIS  (CAPD) CATHETER REMOVAL;  Surgeon: Donnie Mesa, MD;  Location: [place];  Service: General;  Laterality: N/A;  . CATARACT EXTRACTION W/ INTRAOCULAR LENS  IMPLANT, BILATERAL Bilateral 2011  . [place]; 1987  . CHOLECYSTECTOMY  2011  . COLONOSCOPY    .  ENDOMETRIAL CRYOABLATION  1980's  . INTRAUTERINE DEVICE (IUD) INSERTION  2011  . LAPAROSCOPIC GASTRIC SLEEVE RESECTION N/A 05/26/2015   Procedure: LAPAROSCOPIC GASTRIC SLEEVE RESECTION;  Surgeon: Excell Seltzer, MD;  [facility];  Service: General;  Laterality: N/A;  . TRANSPLANTATION RENAL  2013   "right" (09/04/2012)  . TUBAL LIGATION  1987  . [place]   "RLE; stripped" (09/04/2012)  . VIDEO BRONCHOSCOPY  09/07/2012   Procedure: VIDEO BRONCHOSCOPY WITHOUT FLUORO;  Surgeon: Chesley Mires, MD;  Location: [facility] ENDOSCOPY;  Service: Cardiopulmonary;  Laterality: Bilateral;   Past  Medical History:  Diagnosis Date  . Anemia   . Arthritis    "hands" (07/24/2013) pt denies  . Chronic kidney disease, stage IV (severe) ([place])    "never went on dialysis" (07/24/2013)  . Diabetes mellitus type II   . Diabetic peripheral neuropathy ([place])   . Dialysis patient [facility])    Monday Wednesday Friday   . Dyspnea on exertion    pt denies  . Foot pain   . GERD (gastroesophageal reflux disease)   . Gout   . History of chicken pox   . History of measles   . History of mumps   . Hyperlipidemia   . Hypothyroidism   . Migraines    "q 3 months" (07/24/2013)  . Nonspecific abnormal unspecified cardiovascular function study   . Obesity   . PAD (peripheral artery disease) ([place])   . Peripheral neuropathy   . Peripheral vascular disease ([place])   . Pneumonia 09/2012  . Renal transplant disorder   . Shingles   . Sleep apnea    "getting ready to get tested again cause dr says I have it" (07/24/2013) pt states was tested and was told she did not have sleep apnea / no cpap  . Unspecified essential hypertension    BP (!) 153/92   Pulse 93   SpO2 97%   Opioid Risk Score:  0 Fall Risk Score:  `1  Depression screen PHQ 2/9  Depression screen [facility] 2/9 10/12/2017 08/31/2017 08/18/2015 07/14/2015 04/27/2015  Decreased Interest 0 0 0 0 0  Down, Depressed, Hopeless 0 0 0 0 0  PHQ - 2 Score 0 0 0 0 0    Review of Systems  Constitutional: Positive for unexpected weight change.       Low blood sugar  HENT: Negative.   Eyes: Negative.   Respiratory: Negative.   Cardiovascular: Negative.   Gastrointestinal: Positive for constipation and nausea.  Endocrine: Negative.        High, low blood sugar  Genitourinary: Negative.   Musculoskeletal: Positive for arthralgias, myalgias, neck pain and neck stiffness.  Skin: Negative.   Allergic/Immunologic: Negative.   Neurological: Positive for weakness and numbness.       Dysesthesias, neuropathy, tingling  Hematological: Negative.     Psychiatric/Behavioral: The patient is nervous/anxious.   All other systems reviewed and are negative.     Objective:   Physical Exam Gen: NAD. Vital signs reviewed HENT: Normocephalic, Atraumatic Eyes: EOMI. No discharge.  Cardio: RRR. No JVD. Pulm: B/l clear to auscultation.  Effort normal. Abd: Soft, BS+ MSK:  Gait WNL.   No TTP.   Neuro:   Sensation diminished to light touch in b/l feet  Strength  5/5 in all LE myotomes Skin: Warm and Dry. Intact    Assessment & Plan:  57 y/o female with pmh/psh of ESRD on HD, hypotension, migraines, DM with neuropathy, renal transplant, gastric sleeve, presents for  follow up for diabetic/HD associated neuropathy.    1. Diabetic neuropathy  Heat/Cold, Gabapentin, Lidoderm patches, Voltaren gel, Capsaicin, Elavil ineffective  Side effects with Gabapentin  Pamelor 25mg  BID, pt encouraged adjusting of timing  Vit B12, Vit D ordered, pt still has not had it done, encouraged follow up again  Cont Lyrica to 75 AM and 150 after HD  Cont Epson salt baths  Awaiting trial of OT baths, pt now states she called, but has not received a return call  Ordered TENS IT, pt states she is awaiting call back, no call from rep   2. Sleep disturbance  Poor on dialysis days  Cont Nortriptyline   3. Vitamin D deficiency  Labs ordered, encouraged pt to follow up

## 2017-11-09 NOTE — Patient Instructions (Addendum)
   Home work:   Please follow up with occupational therapy    Please follow up about TENS unit    Please obtain labs

## 2017-11-23 ENCOUNTER — Ambulatory Visit: Payer: Medicare HMO | Admitting: Podiatry

## 2017-11-23 LAB — VITAMIN D 25 HYDROXY (VIT D DEFICIENCY, FRACTURES): Vit D, 25-Hydroxy: 9.4 ng/mL — ABNORMAL LOW (ref 30.0–100.0)

## 2017-11-23 LAB — VITAMIN B12: Vitamin B-12: 960 pg/mL (ref 232–1245)

## 2017-11-30 ENCOUNTER — Ambulatory Visit: Payer: Medicare HMO | Admitting: Podiatry

## 2017-11-30 DIAGNOSIS — L97521 Non-pressure chronic ulcer of other part of left foot limited to breakdown of skin: Secondary | ICD-10-CM

## 2017-11-30 MED ORDER — NONFORMULARY OR COMPOUNDED ITEM: 1.0000 g | Freq: Four times a day (QID) | 2 refills | Status: DC

## 2017-12-02 ENCOUNTER — Encounter (HOSPITAL_COMMUNITY): Payer: Self-pay | Admitting: Emergency Medicine

## 2017-12-02 ENCOUNTER — Emergency Department (HOSPITAL_COMMUNITY)
Admission: EM | Admit: 2017-12-02 | Discharge: 2017-12-02 | Disposition: A | Discharge status: Home / Self Care | Payer: Medicare HMO | Attending: Emergency Medicine | Admitting: Emergency Medicine

## 2017-12-02 DIAGNOSIS — M10069 Idiopathic gout, unspecified knee: Secondary | ICD-10-CM

## 2017-12-02 DIAGNOSIS — E039 Hypothyroidism, unspecified: Secondary | ICD-10-CM | POA: Diagnosis not present

## 2017-12-02 DIAGNOSIS — M10061 Idiopathic gout, right knee: Secondary | ICD-10-CM | POA: Insufficient documentation

## 2017-12-02 DIAGNOSIS — E1122 Type 2 diabetes mellitus with diabetic chronic kidney disease: Secondary | ICD-10-CM | POA: Diagnosis not present

## 2017-12-02 DIAGNOSIS — M10062 Idiopathic gout, left knee: Secondary | ICD-10-CM | POA: Diagnosis not present

## 2017-12-02 DIAGNOSIS — M109 Gout, unspecified: Secondary | ICD-10-CM

## 2017-12-02 DIAGNOSIS — M25561 Pain in right knee: Secondary | ICD-10-CM | POA: Diagnosis present

## 2017-12-02 DIAGNOSIS — Z79899 Other long term (current) drug therapy: Secondary | ICD-10-CM | POA: Insufficient documentation

## 2017-12-02 DIAGNOSIS — Z794 Long term (current) use of insulin: Secondary | ICD-10-CM | POA: Insufficient documentation

## 2017-12-02 DIAGNOSIS — N184 Chronic kidney disease, stage 4 (severe): Secondary | ICD-10-CM | POA: Insufficient documentation

## 2017-12-02 DIAGNOSIS — I129 Hypertensive chronic kidney disease with stage 1 through stage 4 chronic kidney disease, or unspecified chronic kidney disease: Secondary | ICD-10-CM | POA: Diagnosis not present

## 2017-12-02 MED ORDER — OXYCODONE-ACETAMINOPHEN 5-325 MG PO TABS
1.0000 | ORAL_TABLET | Freq: Once | ORAL | Status: AC
  Administered 2017-12-02: 1 via ORAL
  Filled 2017-12-02: qty 1

## 2017-12-02 MED ORDER — ALLOPURINOL 100 MG PO TABS: 200.0000 mg | ORAL_TABLET | Freq: Every day | ORAL | 0 refills | Status: DC

## 2017-12-02 MED ORDER — OXYCODONE-ACETAMINOPHEN 5-325 MG PO TABS: 1.0000 | ORAL_TABLET | ORAL | 0 refills | Status: DC | PRN

## 2017-12-02 NOTE — ED Provider Notes (Signed)
[facility] EMERGENCY DEPARTMENT Provider Note   CSN: [id number] Arrival date & time: 12/02/17  2006     History   Chief Complaint Chief Complaint  Patient presents with  . Gout    HPI Wanda [name] is a 57 y.o. female with hx of DM, Stage IV renal disease and on dialysis and has multiple other chronic medical problems, who presents to the ED with bilateral knee pain. Patient thinks it is from her gout. Patient reports not being on any gout medication for a while. She describes the pain as sharp, aching and states that it hurts just for anything to touch the knees. Patient reports she ate seafood and the next day she had bad pain in her knees that has gotten worse. Patient reports she usually takes allopurinol 300 mg and Renvela 2.4. Patient called her pharmacy to try and get the medication but there were no refills left.   HPI  Past Medical History:  Diagnosis Date  . Anemia   . Arthritis    "hands" (07/24/2013) pt denies  . Chronic kidney disease, stage IV (severe) ([place])    "never went on dialysis" (07/24/2013)  . Diabetes mellitus type II   . Diabetic peripheral neuropathy ([place])   . Dialysis patient [facility])    Monday Wednesday Friday   . Dyspnea on exertion    pt denies  . Foot pain   . GERD (gastroesophageal reflux disease)   . Gout   . History of chicken pox   . History of measles   . History of mumps   . Hyperlipidemia   . Hypothyroidism   . Migraines    "q 3 months" (07/24/2013)  . Nonspecific abnormal unspecified cardiovascular function study   . Obesity   . PAD (peripheral artery disease) ([place])   . Peripheral neuropathy   . Peripheral vascular disease ([place])   . Pneumonia 09/2012  . Renal transplant disorder   . Shingles   . Sleep apnea    "getting ready to get tested again cause dr says I have it" (07/24/2013) pt states was tested and was told she did not have sleep apnea / no cpap  . Unspecified essential hypertension     Patient  Active Problem List   Diagnosis Date Noted  . Hypokalemia 10/07/2016  . Peritonitis ([place]) 10/07/2016  . SBP (spontaneous bacterial peritonitis) ([place]) 10/07/2016  . Morbid obesity ([place]) 05/26/2015  . Hypoglycemia 07/24/2013  . Nausea and vomiting 01/08/2013  . Fever 01/08/2013  . Renal failure (ARF), acute on chronic (HCC) 01/08/2013  . Renal transplant disorder 09/04/2012  . Hyponatremia 09/04/2012  . CKD (chronic kidney disease) 09/04/2012  . HTN (hypertension) 09/04/2012  . Healthcare-associated pneumonia 09/04/2012  . Anemia 09/04/2012  . Cough 03/22/2011  . SHINGLES 12/21/2009  . HYPOTHYROIDISM 12/21/2009  . DM (diabetes mellitus), type 2 with renal complications ([place]) 29/51/8841  . HYPERLIPIDEMIA 12/21/2009  . OBESITY 12/21/2009  . DYSPNEA 12/21/2009  . MIGRAINES, HX OF 12/21/2009  . HYPERTENSION, UNSPECIFIED 11/20/2009  . CHRONIC KIDNEY DISEASE STAGE IV ([place]) 11/20/2009  . DYSPNEA ON EXERTION 11/20/2009  . NONSPECIFIC ABNORMAL UNSPEC CV FUNCTION STUDY 11/20/2009    Past Surgical History:  Procedure Laterality Date  . AV FISTULA PLACEMENT Right 2000   "lower arm" (07/24/2013)  . AV FISTULA PLACEMENT, RADIOCEPHALIC Right 6606   "upper arm" (07/24/2013)  . CAPD REMOVAL N/A 10/10/2016   Procedure: CONTINUOUS AMBULATORY PERITONEAL DIALYSIS  (CAPD) CATHETER REMOVAL;  Surgeon: Donnie Mesa, MD;  Location:  [facility] OR;  Service: General;  Laterality: N/A;  . CATARACT EXTRACTION W/ INTRAOCULAR LENS  IMPLANT, BILATERAL Bilateral 2011  . [place]; 1987  . CHOLECYSTECTOMY  2011  . COLONOSCOPY    . ENDOMETRIAL CRYOABLATION  1980's  . INTRAUTERINE DEVICE (IUD) INSERTION  2011  . LAPAROSCOPIC GASTRIC SLEEVE RESECTION N/A 05/26/2015   Procedure: LAPAROSCOPIC GASTRIC SLEEVE RESECTION;  Surgeon: Excell Seltzer, MD;  [facility];  Service: General;  Laterality: N/A;  . TRANSPLANTATION RENAL  2013   "right" (09/04/2012)  . TUBAL LIGATION  1987  . [place]   "RLE; stripped" (09/04/2012)  . VIDEO BRONCHOSCOPY  09/07/2012   Procedure: VIDEO BRONCHOSCOPY WITHOUT FLUORO;  Surgeon: Chesley Mires, MD;  Location: [facility] ENDOSCOPY;  Service: Cardiopulmonary;  Laterality: Bilateral;    OB History    No data available       Home Medications    Prior to Admission medications   Medication Sig Start Date End Date Taking? Authorizing Provider  allopurinol (ZYLOPRIM) 100 MG tablet Take 2 tablets (200 mg total) by mouth daily. 12/02/17   Ashley Murrain, NP  hydrOXYzine (ATARAX/VISTARIL) 25 MG tablet Take 25 mg by mouth 4 (four) times daily as needed for itching.  07/13/16   [provider]  insulin aspart (NOVOLOG FLEXPEN) 100 UNIT/ML FlexPen Inject 10 Units into the skin 3 (three) times daily with meals. 05/28/15   Excell Seltzer, MD  L-Methylfolate-Algae-B12-B6 [place]) 3-90.314-2-35 MG CAPS Take 1 tablet by mouth daily. 01/18/17   [provider]  lidocaine (XYLOCAINE) 5 % ointment Apply 1 application topically as needed. 11/09/17   Jamse Arn, MD  NONFORMULARY OR COMPOUNDED ITEM Apply 1-2 g topically 4 (four) times daily. Bupivacaine 1 %/ Doxepin 3%/ Gabapentin 6%/ Pentoxifyline 3%/ Topiramate 1% 11/30/17   Evelina Bucy, DPM  nortriptyline (PAMELOR) 25 MG capsule Take 1 capsule (25 mg total) by mouth at bedtime. 11/09/17   Jamse Arn, MD  oxyCODONE-acetaminophen (PERCOCET) 5-325 MG tablet Take 1 tablet by mouth every 4 (four) hours as needed for severe pain. 12/02/17   Ashley Murrain, NP  pregabalin (LYRICA) 150 MG capsule Take 1 capsule (150 mg total) by mouth every dialysis. Take after HD 11/09/17   Jamse Arn, MD  pregabalin (LYRICA) 75 MG capsule Take 1 capsule (75 mg total) by mouth daily. 11/09/17   Jamse Arn, MD  sevelamer carbonate (RENVELA) 2.4 g PACK Take 2.4 g by mouth See admin instructions. Sprinkle 1 packet (2.4 g) with every meal and with snacks    [provider]  silver sulfADIAZINE  (SILVADENE) 1 % cream Apply fingertip amount to wound daily. 10/26/17   Evelina Bucy, DPM    Family History Family History  Problem Relation Age of Onset  . Colon cancer Father        deceased at age 70  . Diabetes Brother   . Neuropathy Neg Hx     Social History Social History   Tobacco Use  . Smoking status: Never Smoker  . Smokeless tobacco: Never Used  Substance Use Topics  . Alcohol use: No  . Drug use: No     Allergies   Hydrocodone-acetaminophen; Propoxyphene n-acetaminophen; and Cephalosporins   Review of Systems Review of Systems  Musculoskeletal: Positive for arthralgias.       Bilateral knee pain     Physical Exam Updated Vital Signs BP 137/74 (BP Location: Left Arm)   Pulse 87  Temp 98.3 F (36.8 C) (Oral)   Resp 20   Ht 5\' 10"  (1.778 m)   Wt 99.3 kg (219 lb)   SpO2 99%   BMI 31.42 kg/m   Physical Exam  Constitutional: No distress.  HENT:  Head: Normocephalic.  Eyes: EOM are normal.  Neck: Neck supple.  Cardiovascular: Normal rate.  Pulmonary/Chest: Effort normal.  Musculoskeletal:  Bilateral knees with swelling, increased warmth and pain with palpation or range of motion. No erythema or red streaking.  Neurological: She is alert.  Skin: Skin is warm and dry.  Nursing note and vitals reviewed.    ED Treatments / Results  Labs (all labs ordered are listed, but only abnormal results are displayed) Labs Reviewed - No data to display  Radiology No results found.  Procedures Procedures (including critical care time)  Medications Ordered in ED Medications  oxyCODONE-acetaminophen (PERCOCET/ROXICET) 5-325 MG per tablet 1 tablet (1 tablet Oral Given 12/02/17 2148)     Initial Impression / Assessment and Plan / ED Course  I have reviewed the triage vital signs and the nursing notes. 57 y.o. female with hx of gout and knee pain after eating seafood stable for d/c without fever or signs of septic joint. Will refill allopurinol and  manage pain. Patient to f/u with PCP on Monday. She will return here for worsening symptoms.   Final Clinical Impressions(s) / ED Diagnoses   Final diagnoses:  Gout, arthritis  Acute idiopathic gout of knee, unspecified laterality    ED Discharge Orders        Ordered    allopurinol (ZYLOPRIM) 100 MG tablet  Daily     12/02/17 2215    oxyCODONE-acetaminophen (PERCOCET) 5-325 MG tablet  Every 4 hours PRN     12/02/17 2215       Debroah Baller [place], NP 12/03/17 0102    Pattricia Boss, MD 12/03/17 [phone]

## 2017-12-02 NOTE — ED Triage Notes (Signed)
Reports pain in bil knees from gout.  Has not been on anything for gout in a while.

## 2017-12-02 NOTE — Discharge Instructions (Signed)
Call your doctor on Monday for follow up.

## 2017-12-04 ENCOUNTER — Encounter: Payer: Self-pay | Admitting: Endocrinology

## 2017-12-11 ENCOUNTER — Telehealth: Payer: Self-pay | Admitting: Podiatry

## 2017-12-11 NOTE — Telephone Encounter (Signed)
Left message for pt that her insurance requires referrals from the doctor on the card and I called there office and they stated you had not been seen there in a while. We need you to get name on card changed or go see Dr Mancel Bale to get referral..And to please call me.

## 2017-12-12 ENCOUNTER — Other Ambulatory Visit: Payer: Medicare HMO

## 2017-12-14 ENCOUNTER — Encounter: Payer: Medicare HMO | Admitting: Physical Medicine & Rehabilitation

## 2017-12-14 ENCOUNTER — Ambulatory Visit: Payer: Self-pay | Admitting: Physical Medicine & Rehabilitation

## 2017-12-19 ENCOUNTER — Ambulatory Visit: Payer: Medicare HMO | Admitting: Orthotics

## 2017-12-19 DIAGNOSIS — E1151 Type 2 diabetes mellitus with diabetic peripheral angiopathy without gangrene: Secondary | ICD-10-CM

## 2017-12-19 DIAGNOSIS — E1142 Type 2 diabetes mellitus with diabetic polyneuropathy: Secondary | ICD-10-CM

## 2017-12-19 DIAGNOSIS — L97521 Non-pressure chronic ulcer of other part of left foot limited to breakdown of skin: Secondary | ICD-10-CM

## 2017-12-19 NOTE — Progress Notes (Signed)
Patient cast today for diabetic shoes; patient measured size 59m on [place] Device; Patient has hx of ulceration and DM2; DPM is Price MD is [place].  Patient chose [id number]

## 2017-12-27 ENCOUNTER — Encounter: Payer: Medicare HMO | Admitting: Physical Medicine & Rehabilitation

## 2017-12-27 DIAGNOSIS — Z9884 Bariatric surgery status: Secondary | ICD-10-CM | POA: Insufficient documentation

## 2017-12-28 ENCOUNTER — Ambulatory Visit: Payer: Medicare HMO | Admitting: Podiatry

## 2018-01-04 ENCOUNTER — Ambulatory Visit: Payer: Medicare HMO | Admitting: Podiatry

## 2018-01-04 DIAGNOSIS — E1151 Type 2 diabetes mellitus with diabetic peripheral angiopathy without gangrene: Secondary | ICD-10-CM

## 2018-01-04 DIAGNOSIS — L97521 Non-pressure chronic ulcer of other part of left foot limited to breakdown of skin: Secondary | ICD-10-CM

## 2018-01-04 DIAGNOSIS — E1142 Type 2 diabetes mellitus with diabetic polyneuropathy: Secondary | ICD-10-CM

## 2018-01-12 ENCOUNTER — Other Ambulatory Visit: Payer: Self-pay | Admitting: General Surgery

## 2018-01-12 DIAGNOSIS — Z9884 Bariatric surgery status: Secondary | ICD-10-CM

## 2018-01-18 ENCOUNTER — Other Ambulatory Visit: Payer: Self-pay

## 2018-01-18 ENCOUNTER — Encounter: Payer: Medicare HMO | Attending: Physical Medicine & Rehabilitation | Admitting: Physical Medicine & Rehabilitation

## 2018-01-18 ENCOUNTER — Encounter: Payer: Self-pay | Admitting: Physical Medicine & Rehabilitation

## 2018-01-18 ENCOUNTER — Ambulatory Visit: Payer: Medicare HMO | Admitting: Podiatry

## 2018-01-18 VITALS — BP 103/70 | HR 104 | Ht 69.0 in | Wt 219.2 lb

## 2018-01-18 DIAGNOSIS — N186 End stage renal disease: Secondary | ICD-10-CM | POA: Diagnosis not present

## 2018-01-18 DIAGNOSIS — Z833 Family history of diabetes mellitus: Secondary | ICD-10-CM | POA: Diagnosis not present

## 2018-01-18 DIAGNOSIS — Z9851 Tubal ligation status: Secondary | ICD-10-CM | POA: Diagnosis not present

## 2018-01-18 DIAGNOSIS — Z9889 Other specified postprocedural states: Secondary | ICD-10-CM | POA: Insufficient documentation

## 2018-01-18 DIAGNOSIS — I120 Hypertensive chronic kidney disease with stage 5 chronic kidney disease or end stage renal disease: Secondary | ICD-10-CM | POA: Insufficient documentation

## 2018-01-18 DIAGNOSIS — M109 Gout, unspecified: Secondary | ICD-10-CM | POA: Diagnosis not present

## 2018-01-18 DIAGNOSIS — E785 Hyperlipidemia, unspecified: Secondary | ICD-10-CM | POA: Diagnosis not present

## 2018-01-18 DIAGNOSIS — E559 Vitamin D deficiency, unspecified: Secondary | ICD-10-CM | POA: Diagnosis not present

## 2018-01-18 DIAGNOSIS — G479 Sleep disorder, unspecified: Secondary | ICD-10-CM | POA: Diagnosis not present

## 2018-01-18 DIAGNOSIS — E1122 Type 2 diabetes mellitus with diabetic chronic kidney disease: Secondary | ICD-10-CM | POA: Insufficient documentation

## 2018-01-18 DIAGNOSIS — E039 Hypothyroidism, unspecified: Secondary | ICD-10-CM | POA: Diagnosis not present

## 2018-01-18 DIAGNOSIS — K219 Gastro-esophageal reflux disease without esophagitis: Secondary | ICD-10-CM | POA: Insufficient documentation

## 2018-01-18 DIAGNOSIS — Z8619 Personal history of other infectious and parasitic diseases: Secondary | ICD-10-CM | POA: Diagnosis not present

## 2018-01-18 DIAGNOSIS — E1142 Type 2 diabetes mellitus with diabetic polyneuropathy: Secondary | ICD-10-CM | POA: Insufficient documentation

## 2018-01-18 DIAGNOSIS — Z808 Family history of malignant neoplasm of other organs or systems: Secondary | ICD-10-CM | POA: Insufficient documentation

## 2018-01-18 DIAGNOSIS — Z940 Kidney transplant status: Secondary | ICD-10-CM | POA: Insufficient documentation

## 2018-01-18 MED ORDER — VITAMIN D (ERGOCALCIFEROL) 1.25 MG (50000 UNIT) PO CAPS: 50000.0000 [IU] | ORAL_CAPSULE | ORAL | 0 refills | Status: DC

## 2018-01-18 MED ORDER — PREGABALIN 150 MG PO CAPS: 150.0000 mg | ORAL_CAPSULE | ORAL | 1 refills | Status: DC | PRN

## 2018-01-18 MED ORDER — PREGABALIN 75 MG PO CAPS: 75.0000 mg | ORAL_CAPSULE | Freq: Every day | ORAL | 1 refills | Status: DC

## 2018-01-18 NOTE — Patient Instructions (Signed)
Please trial over the counter TENS (Transcutaneous Electrical Nerve Stimulation) unit  Please speak with Nephrology regarding trail of vasodilator cream during/immediately post dialysis

## 2018-01-18 NOTE — Addendum Note (Signed)
Addended by: Delice Lesch A on: 01/18/2018 03:29 PM   Modules accepted: Orders

## 2018-01-18 NOTE — Progress Notes (Signed)
Subjective:    Patient ID: Wanda [name], female    DOB: November 29, 1960, 57 y.o.   MRN: [id number]  HPI 57 y/o female with pmh/psh of ESRD on HD, hypotension, migraines, DM with neuropathy, renal transplant, gastric sleeve, presents for follow up for diabetic/HD associated neuropathy.   Initially stated: Started ~4 years ago after dialysis, getting worse. Located mid-shins distally and hands.  Worst on dialysis days.  Rest improves the pain.  Laying down exacerbates the pain.  Pin/needles.  Constant. Lyrica provides temporary benefit.  Gabapentin does not help.  Capsaisin does not help.  Denies associated weakness.  Denies falls. Makes it difficult to sleep.    Last clinic visit 11/09/17.  Since that time, pt went to the ED for gout, notes reviewed. She states she is feeling much better. She did obtain labs.  She states she still has not received a call from OT. She has not received a call regarding TENS unit either.  Sleep is still a problem.  She states she goes to a new dialysis center where they are removing more fluid and the pain is starting sooner.   Pain Inventory Average Pain 6 Pain Right Now 5 My pain is sharp, stabbing, tingling and aching  In the last 24 hours, has pain interfered with the following? General activity 5 Relation with others 6 Enjoyment of life 5 What TIME of day is your pain at its worst? daytime, evening and night, night Sleep (in general) Poor  Pain is worse with: sitting and some activites Pain improves with: medication Relief from Meds: 7  Mobility walk without assistance how many minutes can you walk? [name] Kitchen ability to climb steps?  no do you drive?  yes Do you have any goals in this area?  yes  Function disabled: date disabled 2015 Do you have any goals in this area?  yes  Neuro/Psych weakness tingling dizziness anxiety  Prior Studies Any changes since last visit?  no  Physicians involved in your care Any changes since last visit?   yes   Family History  Problem Relation Age of Onset  . Colon cancer Father        deceased at age 62  . Diabetes Brother   . Neuropathy Neg Hx    Social History   Socioeconomic History  . Marital status: Married    Spouse name: [name]  . Number of children: 2  . Years of education: 30  . Highest education level: Not on file  Occupational History  . Occupation: Retired- disabled  Social Needs  . Financial resource strain: Not on file  . Food insecurity:    Worry: Not on file    Inability: Not on file  . Transportation needs:    Medical: Not on file    Non-medical: Not on file  Tobacco Use  . Smoking status: Never Smoker  . Smokeless tobacco: Never Used  Substance and Sexual Activity  . Alcohol use: No  . Drug use: No  . Sexual activity: Yes  Lifestyle  . Physical activity:    Days per week: Not on file    Minutes per session: Not on file  . Stress: Not on file  Relationships  . Social connections:    Talks on phone: Not on file    Gets together: Not on file    Attends religious service: Not on file    Active member of club or organization: Not on file    Attends meetings of clubs or organizations:  Not on file    Relationship status: Not on file  Other Topics Concern  . Not on file  Social History Narrative   Lives with husband and child.   Caffeine use: none   Past Surgical History:  Procedure Laterality Date  . AV FISTULA PLACEMENT Right 2000   "lower arm" (07/24/2013)  . AV FISTULA PLACEMENT, RADIOCEPHALIC Right 3151   "upper arm" (07/24/2013)  . CAPD REMOVAL N/A 10/10/2016   Procedure: CONTINUOUS AMBULATORY PERITONEAL DIALYSIS  (CAPD) CATHETER REMOVAL;  Surgeon: Donnie Mesa, MD;  Location: [place];  Service: General;  Laterality: N/A;  . CATARACT EXTRACTION W/ INTRAOCULAR LENS  IMPLANT, BILATERAL Bilateral 2011  . [place]; 1987  . CHOLECYSTECTOMY  2011  . COLONOSCOPY    . ENDOMETRIAL CRYOABLATION  1980's  . INTRAUTERINE DEVICE (IUD)  INSERTION  2011  . LAPAROSCOPIC GASTRIC SLEEVE RESECTION N/A 05/26/2015   Procedure: LAPAROSCOPIC GASTRIC SLEEVE RESECTION;  Surgeon: Excell Seltzer, MD;  Location[facility];  Service: General;  Laterality: N/A;  . TRANSPLANTATION RENAL  2013   "right" (09/04/2012)  . TUBAL LIGATION  1987  . [place]   "RLE; stripped" (09/04/2012)  . VIDEO BRONCHOSCOPY  09/07/2012   Procedure: VIDEO BRONCHOSCOPY WITHOUT FLUORO;  Surgeon: Chesley Mires, MD;  Location: [facility] ENDOSCOPY;  Service: Cardiopulmonary;  Laterality: Bilateral;   Past Medical History:  Diagnosis Date  . Anemia   . Arthritis    "hands" (07/24/2013) pt denies  . Chronic kidney disease, stage IV (severe) ([place])    "never went on dialysis" (07/24/2013)  . Diabetes mellitus type II   . Diabetic peripheral neuropathy ([place])   . Dialysis patient [facility])    Monday Wednesday Friday   . Dyspnea on exertion    pt denies  . Foot pain   . GERD (gastroesophageal reflux disease)   . Gout   . History of chicken pox   . History of measles   . History of mumps   . Hyperlipidemia   . Hypothyroidism   . Migraines    "q 3 months" (07/24/2013)  . Nonspecific abnormal unspecified cardiovascular function study   . Obesity   . PAD (peripheral artery disease) ([place])   . Peripheral neuropathy   . Peripheral vascular disease ([place])   . Pneumonia 09/2012  . Renal transplant disorder   . Shingles   . Sleep apnea    "getting ready to get tested again cause dr says I have it" (07/24/2013) pt states was tested and was told she did not have sleep apnea / no cpap  . Unspecified essential hypertension    BP 103/70   Pulse (!) 104   Ht 5\' 9"  (1.753 m) Comment: pt reported  Wt 219 lb 3.2 oz (99.4 kg)   SpO2 99%   BMI 32.37 kg/m   Opioid Risk Score:  0 Fall Risk Score:  `1  Depression screen PHQ 2/9  Depression screen [facility] 2/9 01/18/2018 10/12/2017 08/31/2017 08/18/2015 07/14/2015 04/27/2015  Decreased Interest 0 0 0 0 0 0  Down,  Depressed, Hopeless 0 0 0 0 0 0  PHQ - 2 Score 0 0 0 0 0 0    Review of Systems  Constitutional: Positive for unexpected weight change.       Low blood sugar  HENT: Negative.   Eyes: Negative.   Respiratory: Negative.   Cardiovascular: Negative.   Gastrointestinal: Positive for constipation and nausea.  Endocrine: Negative.        High,  low blood sugar  Genitourinary: Negative.   Musculoskeletal: Positive for arthralgias, myalgias, neck pain and neck stiffness.  Skin: Negative.   Allergic/Immunologic: Negative.   Neurological: Positive for weakness and numbness.       Dysesthesias, neuropathy, tingling  Hematological: Negative.   Psychiatric/Behavioral: The patient is nervous/anxious.   All other systems reviewed and are negative.     Objective:   Physical Exam Gen: NAD. Vital signs reviewed HENT: Normocephalic, Atraumatic Eyes: EOMI. No discharge.  Cardio: RRR. No JVD. Pulm: B/l clear to auscultation.  Effort normal. Abd: Soft, BS+ MSK:  Gait WNL.   No TTP.   Neuro:   Sensation diminished to light touch in b/l feet  Strength  5/5 in all LE myotomes Skin: Warm and Dry. Intact    Assessment & Plan:  57 y/o female with pmh/psh of ESRD on HD, hypotension, migraines, DM with neuropathy, renal transplant, gastric sleeve, presents for follow up for diabetic/HD associated neuropathy.    1. Diabetic neuropathy  Heat/Cold, Gabapentin, Lidoderm patches, Voltaren gel, Capsaicin, Elavil ineffective  Side effects with Gabapentin  Pamelor 25mg  BID, pt encouraged adjusting of timing  Vit B12 WNL  Cont Lyrica to 75 AM and 150 after HD  Cont Epson salt baths  Continues to awaiting trial of OT baths, will follow up again  Ordered TENS IT, pt states she continues to await call back, encouraged OTC  Encouraged discussion with Nephro regarding trial vasodilator creams  Will consider compound medications   2. Sleep disturbance  Poor on dialysis days  Cont Nortriptyline   3.  Vitamin D deficiency  Vit D 9.4 on 2/20  Supplementation ordered

## 2018-01-23 ENCOUNTER — Ambulatory Visit
Admission: RE | Admit: 2018-01-23 | Discharge: 2018-01-23 | Disposition: A | Discharge status: Home / Self Care | Payer: Medicare HMO | Source: Ambulatory Visit | Attending: General Surgery | Admitting: General Surgery

## 2018-01-23 DIAGNOSIS — Z9884 Bariatric surgery status: Secondary | ICD-10-CM

## 2018-01-25 ENCOUNTER — Ambulatory Visit: Payer: Medicare HMO | Admitting: Podiatry

## 2018-01-26 ENCOUNTER — Telehealth: Payer: Self-pay

## 2018-01-26 NOTE — Telephone Encounter (Signed)
error 

## 2018-01-30 NOTE — Progress Notes (Signed)
  Subjective:  Patient ID: Wanda [name], female    DOB: 1961-06-04,  MRN: [id number]  Chief Complaint  Patient presents with  . Foot Ulcer    F/U Lt ulcer Pt. stated," it's a whole lot better, I don't have any pain. Also, I need a RF for silvadene." _ diabetes type 2 sugar: 137 a1c: 8.2   57 y.o. female presents with the above complaint.  Needs refill for silvadene. No new issues.  Objective:   General AA&O x3. Normal mood and affect.  Vascular Dorsalis pedis pulses present 1+ bilaterally  Posterior tibial pulses absent bilaterally  Capillary refill normal to all digits. Pedal hair growth normal.  Neurologic Epicritic sensation present bilaterally. Protective sensation with 5.07 monofilament absent bilaterally. Vibratory sensation present bilaterally.  Dermatologic No open lesions. Interspaces clear of maceration.  Normal skin temperature and turgor. Nails: brittle, onychomycosis, thickening, elongation 0.5 x 0.5 ulceration noted to the left plantar fifth metatarsal.  No probe to bone.  No ascending cellulitis no warmth or erythema   Orthopedic: No history of amputation. MMT 5/5 in dorsiflexion, plantarflexion, inversion, and eversion. Normal lower extremity joint ROM without pain or crepitus.   Assessment & Plan:  Patient was evaluated and treated and all questions answered.  L 5th Met Ulcer -Dressed with Silvadene and dry sterile dressing  -Educated to perform daily dressing changes -Rx Silvadene  Return in about 4 weeks (around 12/28/2017) for Wound Care.

## 2018-01-30 NOTE — Progress Notes (Signed)
  Subjective:  Patient ID: Wanda [name], female    DOB: 10/27/1960,  MRN: [id number]  No chief complaint on file.  57 y.o. female returns for the above complaint.  Here for wound check.  States that she is doing much better.  States that the wounds have remained closed still some callused areas.  States she takes hear from them herself.  Objective:  There were no vitals filed for this visit. General AA&O x3. Normal mood and affect.  Vascular Pedal pulses palpable.  Neurologic Epicritic sensation grossly intact.  Dermatologic No open lesions. Skin normal texture and turgor.  HPK sub-met 1 5 bilateral.  Healed.  No open ulceration.  Orthopedic: No pain to palpation either foot.   Assessment & Plan:  Patient was evaluated and treated and all questions answered.  HPKs bilat with history of ulceration -Remain healed. F/u in 2 weeks for recheck.  Return in about 2 weeks (around 01/18/2018) for Wound Care.

## 2018-02-07 ENCOUNTER — Other Ambulatory Visit: Payer: Self-pay | Admitting: Physical Medicine & Rehabilitation

## 2018-02-07 ENCOUNTER — Other Ambulatory Visit: Payer: Self-pay

## 2018-02-08 ENCOUNTER — Other Ambulatory Visit: Payer: Self-pay

## 2018-02-08 ENCOUNTER — Ambulatory Visit: Payer: Medicare HMO | Admitting: Podiatry

## 2018-02-08 DIAGNOSIS — I770 Arteriovenous fistula, acquired: Secondary | ICD-10-CM | POA: Insufficient documentation

## 2018-02-15 ENCOUNTER — Encounter: Payer: Medicare HMO | Admitting: Physical Medicine & Rehabilitation

## 2018-02-21 ENCOUNTER — Ambulatory Visit: Payer: Self-pay | Admitting: Endocrinology

## 2018-02-22 ENCOUNTER — Encounter: Payer: Self-pay | Admitting: Endocrinology

## 2018-02-22 ENCOUNTER — Ambulatory Visit: Payer: Medicare HMO | Admitting: Podiatry

## 2018-02-22 ENCOUNTER — Ambulatory Visit: Payer: Medicare HMO | Admitting: Endocrinology

## 2018-02-22 VITALS — BP 144/84 | HR 93 | Wt 225.8 lb

## 2018-02-22 DIAGNOSIS — B351 Tinea unguium: Secondary | ICD-10-CM

## 2018-02-22 DIAGNOSIS — E1122 Type 2 diabetes mellitus with diabetic chronic kidney disease: Secondary | ICD-10-CM | POA: Diagnosis not present

## 2018-02-22 DIAGNOSIS — N183 Chronic kidney disease, stage 3 unspecified: Secondary | ICD-10-CM

## 2018-02-22 DIAGNOSIS — Q828 Other specified congenital malformations of skin: Secondary | ICD-10-CM | POA: Diagnosis not present

## 2018-02-22 DIAGNOSIS — E1142 Type 2 diabetes mellitus with diabetic polyneuropathy: Secondary | ICD-10-CM | POA: Diagnosis not present

## 2018-02-22 LAB — POCT GLYCOSYLATED HEMOGLOBIN (HGB A1C): Hemoglobin A1C: 8 % — AB (ref 4.0–5.6)

## 2018-02-22 MED ORDER — INSULIN ASPART 100 UNIT/ML FLEXPEN: 12.0000 [IU] | PEN_INJECTOR | Freq: Three times a day (TID) | SUBCUTANEOUS | 11 refills | Status: DC

## 2018-02-22 NOTE — Progress Notes (Signed)
  Subjective:  Patient ID: Wanda [name], female    DOB: Jul 18, 1961,  MRN: [id number]  Chief Complaint  Patient presents with  . Diabetic Ulcer    bilateral - look good   57 y.o. female returns for the above complaint.  States ulcers are healed. Denies new issues. Complains of elongated nails today.  Objective:  There were no vitals filed for this visit. General AA&O x3. Normal mood and affect.  Vascular Pedal pulses palpable.  Neurologic Epicritic sensation grossly intact. Protective sensation absent.  Dermatologic No open lesions. Skin normal texture and turgor. Nails x10 elongated and dystrophic. HPK sub-met '1 3 5 '$ bilateral.  Healed.  No open ulceration.  Orthopedic: No pain to palpation either foot.   Assessment & Plan:  Patient was evaluated and treated and all questions answered.  DM with DPN, Onychomycosis, HPKs -Routine foot care as below.  Procedure: Nail Debridement Rationale: Patient meets criteria for routine foot care due to Class C findings. Type of Debridement: manual, sharp debridement. Instrumentation: Nail nipper, rotary burr. Number of Nails: 10   Procedure: Paring of Lesion Rationale: painful hyperkeratotic lesion Type of Debridement: manual, sharp debridement. Instrumentation: 312 blade Number of Lesions: 4   HPKs bilat with history of ulceration -Remain healed. Awaiting DM shoes.  Return in about 6 weeks (around 04/05/2018) for Wound Care.

## 2018-02-22 NOTE — Patient Instructions (Addendum)
good diet and exercise significantly improve the control of your diabetes.  please let me know if you wish to be referred to a dietician.  high blood sugar is very risky to your health.  you should see an eye doctor and dentist every year.  It is very important to get all recommended vaccinations.  Controlling your blood pressure and cholesterol drastically reduces the damage diabetes does to your body.  Those who smoke should quit.  Please discuss these with your doctor.  check your blood sugar twice a day.  vary the time of day when you check, between before the 3 meals, and at bedtime.  also check if you have symptoms of your blood sugar being too high or too low.  please keep a record of the readings and bring it to your next appointment here (or you can bring the meter itself).  You can write it on any piece of paper.  please call us sooner if your blood sugar goes below 70, or if you have a lot of readings over 200.   For now, please: Increase the novolog to 12 units 3 times a day (just before each meal) You can skip the bedtime novolog (and the bedtime snack). Please come back for a follow-up appointment in 2 months.  Please call or message [place] next week, to tell us how the blood sugar is doing.

## 2018-02-22 NOTE — Progress Notes (Signed)
Subjective:    Patient ID: Wanda [name], female    DOB: 1961-05-20, 56 y.o.   MRN: [id number]  HPI pt is referred by Dr Deterding, for diabetes.  Pt states DM was dx'ed in 1992; she has severe neuropathy of the lower extremities and assoc pain; she also has foot ulcers, ESRD (2013 transplant failed), and PDR; she has been on insulin since 2014; pt says her diet and exercise are poor; she has never had GDM, pancreatitis, or pancreatic surgery.  Last episode of severe hypoglycemia was last week.  This happens fasting.  Last episode of DKA was 2009.  She takes novolog 10 units qid (qac and qhs).  She no longer takes lantus.  She had gastric sleve surg in 2015.  She eats at [facility], out of fear of hypoglycemia at night (but she takes novolog then, too).   Past Medical History:  Diagnosis Date  . Anemia   . Arthritis    "hands" (07/24/2013) pt denies  . Chronic kidney disease, stage IV (severe) ([place])    "never went on dialysis" (07/24/2013)  . Diabetes mellitus type II   . Diabetic peripheral neuropathy ([place])   . Dialysis patient [facility])    Monday Wednesday Friday   . Dyspnea on exertion    pt denies  . Foot pain   . GERD (gastroesophageal reflux disease)   . Gout   . History of chicken pox   . History of measles   . History of mumps   . Hyperlipidemia   . Hypothyroidism   . Migraines    "q 3 months" (07/24/2013)  . Nonspecific abnormal unspecified cardiovascular function study   . Obesity   . PAD (peripheral artery disease) ([place])   . Peripheral neuropathy   . Peripheral vascular disease ([place])   . Pneumonia 09/2012  . Renal transplant disorder   . Shingles   . Sleep apnea    "getting ready to get tested again cause dr says I have it" (07/24/2013) pt states was tested and was told she did not have sleep apnea / no cpap  . Unspecified essential hypertension     Past Surgical History:  Procedure Laterality Date  . AV FISTULA PLACEMENT Right 2000   "lower arm" (07/24/2013)  . AV  FISTULA PLACEMENT, RADIOCEPHALIC Right 6433   "upper arm" (07/24/2013)  . CAPD REMOVAL N/A 10/10/2016   Procedure: CONTINUOUS AMBULATORY PERITONEAL DIALYSIS  (CAPD) CATHETER REMOVAL;  Surgeon: Donnie Mesa, MD;  Location: [place];  Service: General;  Laterality: N/A;  . CATARACT EXTRACTION W/ INTRAOCULAR LENS  IMPLANT, BILATERAL Bilateral 2011  . [place]; 1987  . CHOLECYSTECTOMY  2011  . COLONOSCOPY    . ENDOMETRIAL CRYOABLATION  1980's  . INTRAUTERINE DEVICE (IUD) INSERTION  2011  . LAPAROSCOPIC GASTRIC SLEEVE RESECTION N/A 05/26/2015   Procedure: LAPAROSCOPIC GASTRIC SLEEVE RESECTION;  Surgeon: Excell Seltzer, MD;  [facility];  Service: General;  Laterality: N/A;  . TRANSPLANTATION RENAL  2013   "right" (09/04/2012)  . TUBAL LIGATION  1987  . [place]   "RLE; stripped" (09/04/2012)  . VIDEO BRONCHOSCOPY  09/07/2012   Procedure: VIDEO BRONCHOSCOPY WITHOUT FLUORO;  Surgeon: Chesley Mires, MD;  Location: [facility] ENDOSCOPY;  Service: Cardiopulmonary;  Laterality: Bilateral;    Social History   Socioeconomic History  . Marital status: Married    Spouse name: [name]  . Number of children: 2  . Years of education: 19  . Highest education level: Not  on file  Occupational History  . Occupation: Retired- disabled  Social Needs  . Financial resource strain: Not on file  . Food insecurity:    Worry: Not on file    Inability: Not on file  . Transportation needs:    Medical: Not on file    Non-medical: Not on file  Tobacco Use  . Smoking status: Never Smoker  . Smokeless tobacco: Never Used  Substance and Sexual Activity  . Alcohol use: No  . Drug use: No  . Sexual activity: Yes  Lifestyle  . Physical activity:    Days per week: Not on file    Minutes per session: Not on file  . Stress: Not on file  Relationships  . Social connections:    Talks on phone: Not on file    Gets together: Not on file    Attends religious service: Not on file    Active  member of club or organization: Not on file    Attends meetings of clubs or organizations: Not on file    Relationship status: Not on file  . Intimate partner violence:    Fear of current or ex partner: Not on file    Emotionally abused: Not on file    Physically abused: Not on file    Forced sexual activity: Not on file  Other Topics Concern  . Not on file  Social History Narrative   Lives with husband and child.   Caffeine use: none    Current Outpatient Medications on File Prior to Visit  Medication Sig Dispense Refill  . allopurinol (ZYLOPRIM) 100 MG tablet Take 2 tablets (200 mg total) by mouth daily. 20 tablet 0  . amitriptyline (ELAVIL) 50 MG tablet Take by mouth.    [name] Kitchen amLODipine (NORVASC) 5 MG tablet Take by mouth.    . calcitRIOL (ROCALTROL) 0.25 MCG capsule Take by mouth daily.  6  . clonazePAM (KLONOPIN) 1 MG tablet Take by mouth.    . colchicine (COLCRYS) 0.6 MG tablet Take by mouth.    . hydrOXYzine (ATARAX/VISTARIL) 25 MG tablet Take 25 mg by mouth 4 (four) times daily as needed for itching.   10  . L-Methylfolate-Algae-B12-B6 (METANX) 3-90.314-2-35 MG CAPS Take 1 tablet by mouth daily.  11  . levothyroxine (SYNTHROID, LEVOTHROID) 50 MCG tablet Take by mouth.    . lidocaine (XYLOCAINE) 5 % ointment Apply 1 application topically as needed. 35.44 g 0  . midodrine (PROAMATINE) 10 MG tablet TAKE 1 TABLET 30 MINS PRIOR TO DIALYSIS ON MOND/WED/FRIDAY,MAY REPEAT MID TUES/THUR IF NEED  5  . NONFORMULARY OR COMPOUNDED ITEM Apply 1-2 g topically 4 (four) times daily. Bupivacaine 1 %/ Doxepin 3%/ Gabapentin 6%/ Pentoxifyline 3%/ Topiramate 1% 120 each 2  . nortriptyline (PAMELOR) 25 MG capsule Take 1 capsule (25 mg total) by mouth at bedtime. 100 capsule 1  . pantoprazole (PROTONIX) 40 MG tablet Take 40 mg by mouth daily.  3  . pregabalin (LYRICA) 150 MG capsule Take 1 capsule (150 mg total) by mouth every dialysis. Take after HD 12 capsule 1  . pregabalin (LYRICA) 75 MG capsule  Take 1 capsule (75 mg total) by mouth daily. 30 capsule 1  . rOPINIRole (REQUIP) 0.25 MG tablet TAKE 1 TABLET BY MOUTH AT BEDTIME AS DIRECTED FOR RESTLESS LEGS  11  . sevelamer carbonate (RENVELA) 2.4 g PACK Take 2.4 g by mouth See admin instructions. Sprinkle 1 packet (2.4 g) with every meal and with snacks    . Vitamin  D, Ergocalciferol, (DRISDOL) 50000 units CAPS capsule Take 1 capsule (50,000 Units total) by mouth every 7 (seven) days. 8 capsule 0  . ALPRAZolam (XANAX) 0.25 MG tablet Take by mouth.    . gabapentin (NEURONTIN) 400 MG capsule Take by mouth.    . hydrOXYzine (ATARAX/VISTARIL) 10 MG tablet Take 10 mg by mouth 2 (two) times daily as needed.  3  . silver sulfADIAZINE (SILVADENE) 1 % cream Apply fingertip amount to wound daily. (Patient not taking: Reported on 02/22/2018) 50 g 0  . traZODone (DESYREL) 100 MG tablet Take 100 mg by mouth at bedtime.  2   No current facility-administered medications on file prior to visit.     Allergies  Allergen Reactions  . Hydrocodone-Acetaminophen Other (See Comments)    Severe headache  . Propoxyphene N-Acetaminophen Other (See Comments)    Severe headache  . Cephalosporins Rash    Family History  Problem Relation Age of Onset  . Colon cancer Father        deceased at age 16  . Diabetes Brother   . Neuropathy Neg Hx     BP (!) 144/84   Pulse 93   Wt 225 lb 12.8 oz (102.4 kg)   SpO2 97%   BMI 33.34 kg/m     Review of Systems denies blurry vision, headache, chest pain, sob, n/v, urinary frequency, muscle cramps, excessive diaphoresis, memory loss, depression, and rhinorrhea.  She has regained weight.  She has cold intolerance and easy bruising.      Objective:   Physical Exam VS: see vs page GEN: no distress HEAD: head: no deformity eyes: no periorbital swelling, no proptosis external nose and ears are normal mouth: no lesion seen NECK: supple, thyroid is not enlarged CHEST WALL: no deformity LUNGS: clear to  auscultation CV: reg rate and rhythm, no murmur ABD: abdomen is soft, nontender.  no hepatosplenomegaly.  not distended.  no hernia MUSCULOSKELETAL: muscle bulk and strength are grossly normal.  no obvious joint swelling.  gait is normal and steady EXTEMITIES: no deformity.  Several healed ulcers the feet.  feet are of normal color and temp.  Trace bilat leg edema.  Access is noted at the right upper arm.  There is bilateral onychomycosis of the toenails PULSES: dorsalis pedis intact bilat.  no carotid bruit. NEURO:  cn 2-12 grossly intact.   readily moves all 4's.  sensation is intact to touch on the feet, but severely decreased from normal.   SKIN:  Normal texture and temperature.  No rash or suspicious lesion is visible.   NODES:  None palpable at the neck PSYCH: alert, well-oriented.  Does not appear anxious nor depressed.   I have reviewed outside records, and summarized: Pt was noted to have elevated a1c, and referred here.  She was also seen recently by podiatry for foot ulcers.    Lab Results  Component Value Date   HGBA1C 8.0 (A) 02/22/2018   I personally reviewed electrocardiogram tracing (10/16/17): Indication: hypoglycemia Impression: NSR.  Ant QS complexes  No hypertrophy. Compared to 2018: no significant change.     Assessment & Plan:  Type 1 DM, with PDR: she needs increased rx.  ESRD: in this setting, she prob does not need basal insulin.  Hypoglycemia, severe.  Obesity: improved with surgery.  Patient Instructions  good diet and exercise significantly improve the control of your diabetes.  please let me know if you wish to be referred to a dietician.  high blood sugar is very risky  to your health.  you should see an eye doctor and dentist every year.  It is very important to get all recommended vaccinations.  Controlling your blood pressure and cholesterol drastically reduces the damage diabetes does to your body.  Those who smoke should quit.  Please discuss these  with your doctor.  check your blood sugar twice a day.  vary the time of day when you check, between before the 3 meals, and at bedtime.  also check if you have symptoms of your blood sugar being too high or too low.  please keep a record of the readings and bring it to your next appointment here (or you can bring the meter itself).  You can write it on any piece of paper.  please call us sooner if your blood sugar goes below 70, or if you have a lot of readings over 200.   For now, please: Increase the novolog to 12 units 3 times a day (just before each meal) You can skip the bedtime novolog (and the bedtime snack). Please come back for a follow-up appointment in 2 months.  Please call or message [place] next week, to tell us how the blood sugar is doing.

## 2018-02-28 ENCOUNTER — Telehealth: Payer: Self-pay | Admitting: Podiatry

## 2018-02-28 NOTE — Telephone Encounter (Signed)
Left message for pt to call me back to discuss diabetic shoes.

## 2018-03-01 ENCOUNTER — Encounter (INDEPENDENT_AMBULATORY_CARE_PROVIDER_SITE_OTHER): Payer: Medicare HMO

## 2018-03-01 ENCOUNTER — Encounter: Payer: Medicare HMO | Admitting: Physical Medicine & Rehabilitation

## 2018-03-05 NOTE — Telephone Encounter (Signed)
Pt left message for me on Saturday 6.1.19.  I returned call and explained that Dr Loanne Drilling sent [place] paperwork that was not signed and stated that info in epic. I do not see the signed paperwork in epic.  I have printed it out and pt is going to take with her to the appt next week.

## 2018-03-15 ENCOUNTER — Encounter: Payer: Medicare HMO | Admitting: Physical Medicine & Rehabilitation

## 2018-03-15 ENCOUNTER — Ambulatory Visit (INDEPENDENT_AMBULATORY_CARE_PROVIDER_SITE_OTHER): Payer: Medicare HMO | Admitting: Family Medicine

## 2018-03-29 ENCOUNTER — Encounter: Payer: Medicare HMO | Admitting: Podiatry

## 2018-03-29 ENCOUNTER — Other Ambulatory Visit: Payer: Self-pay | Admitting: Physical Medicine & Rehabilitation

## 2018-04-01 NOTE — Progress Notes (Signed)
Erroneous

## 2018-04-12 ENCOUNTER — Encounter: Payer: Self-pay | Admitting: Physical Medicine & Rehabilitation

## 2018-04-12 ENCOUNTER — Encounter: Payer: Medicare HMO | Attending: Physical Medicine & Rehabilitation | Admitting: Physical Medicine & Rehabilitation

## 2018-04-12 VITALS — BP 110/75 | HR 95 | Resp 14 | Ht 69.0 in | Wt 217.0 lb

## 2018-04-12 DIAGNOSIS — E785 Hyperlipidemia, unspecified: Secondary | ICD-10-CM | POA: Diagnosis not present

## 2018-04-12 DIAGNOSIS — M109 Gout, unspecified: Secondary | ICD-10-CM | POA: Insufficient documentation

## 2018-04-12 DIAGNOSIS — R030 Elevated blood-pressure reading, without diagnosis of hypertension: Secondary | ICD-10-CM

## 2018-04-12 DIAGNOSIS — I120 Hypertensive chronic kidney disease with stage 5 chronic kidney disease or end stage renal disease: Secondary | ICD-10-CM | POA: Insufficient documentation

## 2018-04-12 DIAGNOSIS — G479 Sleep disorder, unspecified: Secondary | ICD-10-CM

## 2018-04-12 DIAGNOSIS — Z9851 Tubal ligation status: Secondary | ICD-10-CM | POA: Insufficient documentation

## 2018-04-12 DIAGNOSIS — Z940 Kidney transplant status: Secondary | ICD-10-CM | POA: Insufficient documentation

## 2018-04-12 DIAGNOSIS — Z8619 Personal history of other infectious and parasitic diseases: Secondary | ICD-10-CM | POA: Diagnosis not present

## 2018-04-12 DIAGNOSIS — Z9889 Other specified postprocedural states: Secondary | ICD-10-CM | POA: Diagnosis not present

## 2018-04-12 DIAGNOSIS — K219 Gastro-esophageal reflux disease without esophagitis: Secondary | ICD-10-CM | POA: Diagnosis not present

## 2018-04-12 DIAGNOSIS — N186 End stage renal disease: Secondary | ICD-10-CM | POA: Insufficient documentation

## 2018-04-12 DIAGNOSIS — E1122 Type 2 diabetes mellitus with diabetic chronic kidney disease: Secondary | ICD-10-CM | POA: Insufficient documentation

## 2018-04-12 DIAGNOSIS — E1142 Type 2 diabetes mellitus with diabetic polyneuropathy: Secondary | ICD-10-CM

## 2018-04-12 DIAGNOSIS — Z833 Family history of diabetes mellitus: Secondary | ICD-10-CM | POA: Diagnosis not present

## 2018-04-12 DIAGNOSIS — Z808 Family history of malignant neoplasm of other organs or systems: Secondary | ICD-10-CM | POA: Insufficient documentation

## 2018-04-12 DIAGNOSIS — E559 Vitamin D deficiency, unspecified: Secondary | ICD-10-CM

## 2018-04-12 DIAGNOSIS — E039 Hypothyroidism, unspecified: Secondary | ICD-10-CM | POA: Diagnosis not present

## 2018-04-12 MED ORDER — PREGABALIN 75 MG PO CAPS: 75.0000 mg | ORAL_CAPSULE | Freq: Every day | ORAL | 1 refills | Status: DC

## 2018-04-12 MED ORDER — PREGABALIN 150 MG PO CAPS: 150.0000 mg | ORAL_CAPSULE | ORAL | 1 refills | Status: DC | PRN

## 2018-04-12 NOTE — Progress Notes (Signed)
Subjective:    Patient ID: Wanda [name], female    DOB: [birth year], 57 y.o.   MRN: [id number]  HPI 57 y/o female with pmh/psh of ESRD on HD, hypotension, migraines, DM with neuropathy, renal transplant, gastric sleeve, presents for follow up for diabetic/HD associated neuropathy.   Initially stated: Started ~4 years ago after dialysis, getting worse. Located mid-shins distally and hands.  Worst on dialysis days.  Rest improves the pain.  Laying down exacerbates the pain.  Pin/needles.  Constant. Lyrica provides temporary benefit.  Gabapentin does not help.  Capsaisin does not help.  Denies associated weakness.  Denies falls. Makes it difficult to sleep.    Last clinic visit 01/18/18.  Since that time, pt states she never received follow up for baths. She never received follow up for TENS unit. She tried a compound medication with limited benefit.  Pain Inventory Average Pain 6 Pain Right Now 7 My pain is sharp, stabbing, tingling and aching  In the last 24 hours, has pain interfered with the following? General activity 6 Relation with others 4 Enjoyment of life 2 What TIME of day is your pain at its worst? evening and night Sleep (in general) Fair  Pain is worse with: standing and some activites Pain improves with: rest and medication Relief from Meds: 1  Mobility walk without assistance ability to climb steps?  no do you drive?  yes Do you have any goals in this area?  yes  Function retired  Neuro/Psych weakness numbness tingling anxiety  Prior Studies Any changes since last visit?  no  Physicians involved in your care Any changes since last visit?  yes   Family History  Problem Relation Age of Onset  . Colon cancer Father        deceased at age 66  . Diabetes Brother   . Neuropathy Neg Hx    Social History   Socioeconomic History  . Marital status: Married    Spouse name: [name]  . Number of children: 2  . Years of education: 74  . Highest  education level: Not on file  Occupational History  . Occupation: Retired- disabled  Social Needs  . Financial resource strain: Not on file  . Food insecurity:    Worry: Not on file    Inability: Not on file  . Transportation needs:    Medical: Not on file    Non-medical: Not on file  Tobacco Use  . Smoking status: Never Smoker  . Smokeless tobacco: Never Used  Substance and Sexual Activity  . Alcohol use: No  . Drug use: No  . Sexual activity: Yes  Lifestyle  . Physical activity:    Days per week: Not on file    Minutes per session: Not on file  . Stress: Not on file  Relationships  . Social connections:    Talks on phone: Not on file    Gets together: Not on file    Attends religious service: Not on file    Active member of club or organization: Not on file    Attends meetings of clubs or organizations: Not on file    Relationship status: Not on file  Other Topics Concern  . Not on file  Social History Narrative   Lives with husband and child.   Caffeine use: none   Past Surgical History:  Procedure Laterality Date  . AV FISTULA PLACEMENT Right 2000   "lower arm" (07/24/2013)  . AV FISTULA PLACEMENT, RADIOCEPHALIC Right 9476   "  upper arm" (07/24/2013)  . CAPD REMOVAL N/A 10/10/2016   Procedure: CONTINUOUS AMBULATORY PERITONEAL DIALYSIS  (CAPD) CATHETER REMOVAL;  Surgeon: Donnie Mesa, MD;  Location: [place];  Service: General;  Laterality: N/A;  . CATARACT EXTRACTION W/ INTRAOCULAR LENS  IMPLANT, BILATERAL Bilateral 2011  . [place]; 1987  . CHOLECYSTECTOMY  2011  . COLONOSCOPY    . ENDOMETRIAL CRYOABLATION  1980's  . INTRAUTERINE DEVICE (IUD) INSERTION  2011  . LAPAROSCOPIC GASTRIC SLEEVE RESECTION N/A 05/26/2015   Procedure: LAPAROSCOPIC GASTRIC SLEEVE RESECTION;  Surgeon: Excell Seltzer, MD;  Location: [facility];  Service: General;  Laterality: N/A;  . TRANSPLANTATION RENAL  2013   "right" (09/04/2012)  . TUBAL LIGATION  1987  . [place]   "RLE; stripped" (09/04/2012)  . VIDEO BRONCHOSCOPY  09/07/2012   Procedure: VIDEO BRONCHOSCOPY WITHOUT FLUORO;  Surgeon: Chesley Mires, MD;  Location: [facility] ENDOSCOPY;  Service: Cardiopulmonary;  Laterality: Bilateral;   Past Medical History:  Diagnosis Date  . Anemia   . Arthritis    "hands" (07/24/2013) pt denies  . Chronic kidney disease, stage IV (severe) ([place])    "never went on dialysis" (07/24/2013)  . Diabetes mellitus type II   . Diabetic peripheral neuropathy ([place])   . Dialysis patient [facility])    Monday Wednesday Friday   . Dyspnea on exertion    pt denies  . Foot pain   . GERD (gastroesophageal reflux disease)   . Gout   . History of chicken pox   . History of measles   . History of mumps   . Hyperlipidemia   . Hypothyroidism   . Migraines    "q 3 months" (07/24/2013)  . Nonspecific abnormal unspecified cardiovascular function study   . Obesity   . PAD (peripheral artery disease) ([place])   . Peripheral neuropathy   . Peripheral vascular disease ([place])   . Pneumonia 09/2012  . Renal transplant disorder   . Shingles   . Sleep apnea    "getting ready to get tested again cause dr says I have it" (07/24/2013) pt states was tested and was told she did not have sleep apnea / no cpap  . Unspecified essential hypertension    BP 110/75 (BP Location: Left Arm, Patient Position: Sitting, Cuff Size: Large)   Pulse 95   Resp 14   Ht 5\' 9"  (1.753 m)   Wt 217 lb (98.4 kg)   SpO2 98%   BMI 32.05 kg/m   Opioid Risk Score:  0 Fall Risk Score:  `1  Depression screen PHQ 2/9  Depression screen [facility] 2/9 01/18/2018 10/12/2017 08/31/2017 08/18/2015 07/14/2015 04/27/2015  Decreased Interest 0 0 0 0 0 0  Down, Depressed, Hopeless 0 0 0 0 0 0  PHQ - 2 Score 0 0 0 0 0 0    Review of Systems  Constitutional: Positive for unexpected weight change.       Low blood sugar  HENT: Negative.   Eyes: Negative.   Respiratory: Negative.   Cardiovascular: Negative.     Gastrointestinal: Positive for constipation and nausea.  Endocrine: Negative.        High, low blood sugar  Genitourinary: Negative.   Musculoskeletal: Positive for arthralgias, myalgias, neck pain and neck stiffness.  Skin: Negative.   Allergic/Immunologic: Negative.   Neurological: Positive for weakness and numbness.       Dyesthesias, neuropathy, tingling  Hematological: Negative.   Psychiatric/Behavioral: The patient is nervous/anxious.   All other systems  reviewed and are negative.     Objective:   Physical Exam Gen: NAD. Vital signs reviewed HENT: Normocephalic, Atraumatic Eyes: EOMI. No discharge.  Cardio: RRR. No JVD. Pulm: B/l clear to auscultation.  Effort normal. Abd: Soft, BS+ MSK:  Gait WNL.   No TTP.   Neuro:   Sensation diminished to light touch in b/l feet  Strength  5/5 in all LE myotomes Skin: Warm and Dry. Intact    Assessment & Plan:  57 y/o female with pmh/psh of ESRD on HD, hypotension, migraines, DM with neuropathy, renal transplant, gastric sleeve, presents for follow up for diabetic/HD associated neuropathy.    1. Diabetic neuropathy  Heat/Cold, Gabapentin, Lidoderm patches, compound medications, Voltaren gel, Capsaicin, Elavil ineffective  Side effects with Gabapentin  Pamelor 25mg  BID, pt encouraged adjusting of timing  Vit B12 WNL  Cont Lyrica to 75 AM and 150 after HD (discussed timing of medications)  Cont Epson salt baths  Ordered TENS IT, pt states she continues to await call back, will follow up  Encouraged discussion with Nephro regarding trial vasodilator creams again   2. Sleep disturbance  Poor on dialysis days  Cont Nortriptyline   3. Vitamin D deficiency  Vit D 9.4 on 2/20  Will reorder labs  4. GERD  Associated cough, recommended follow up with Nephro for med adjustments

## 2018-04-19 ENCOUNTER — Ambulatory Visit: Payer: Medicare HMO | Admitting: Podiatry

## 2018-05-01 ENCOUNTER — Ambulatory Visit: Payer: Self-pay | Admitting: Endocrinology

## 2018-05-24 ENCOUNTER — Encounter (INDEPENDENT_AMBULATORY_CARE_PROVIDER_SITE_OTHER): Payer: Medicare HMO

## 2018-05-31 ENCOUNTER — Ambulatory Visit (INDEPENDENT_AMBULATORY_CARE_PROVIDER_SITE_OTHER): Payer: Medicare HMO | Admitting: Family Medicine

## 2018-05-31 ENCOUNTER — Encounter (INDEPENDENT_AMBULATORY_CARE_PROVIDER_SITE_OTHER): Payer: Self-pay

## 2018-06-11 ENCOUNTER — Ambulatory Visit: Payer: Self-pay | Admitting: Endocrinology

## 2018-06-22 ENCOUNTER — Other Ambulatory Visit: Payer: Self-pay | Admitting: Physical Medicine & Rehabilitation

## 2018-06-25 NOTE — Telephone Encounter (Signed)
Called into pharmacy [facility]

## 2018-07-12 ENCOUNTER — Encounter: Payer: Medicare HMO | Attending: Physical Medicine & Rehabilitation | Admitting: Physical Medicine & Rehabilitation

## 2018-07-12 ENCOUNTER — Encounter: Payer: Self-pay | Admitting: Physical Medicine & Rehabilitation

## 2018-07-12 VITALS — BP 117/79 | HR 97 | Resp 14 | Ht 69.0 in | Wt 214.0 lb

## 2018-07-12 DIAGNOSIS — E039 Hypothyroidism, unspecified: Secondary | ICD-10-CM | POA: Diagnosis not present

## 2018-07-12 DIAGNOSIS — Z808 Family history of malignant neoplasm of other organs or systems: Secondary | ICD-10-CM | POA: Diagnosis not present

## 2018-07-12 DIAGNOSIS — K219 Gastro-esophageal reflux disease without esophagitis: Secondary | ICD-10-CM | POA: Diagnosis not present

## 2018-07-12 DIAGNOSIS — Z9851 Tubal ligation status: Secondary | ICD-10-CM | POA: Insufficient documentation

## 2018-07-12 DIAGNOSIS — Z9889 Other specified postprocedural states: Secondary | ICD-10-CM | POA: Diagnosis not present

## 2018-07-12 DIAGNOSIS — M109 Gout, unspecified: Secondary | ICD-10-CM | POA: Diagnosis not present

## 2018-07-12 DIAGNOSIS — E559 Vitamin D deficiency, unspecified: Secondary | ICD-10-CM | POA: Diagnosis not present

## 2018-07-12 DIAGNOSIS — E785 Hyperlipidemia, unspecified: Secondary | ICD-10-CM | POA: Insufficient documentation

## 2018-07-12 DIAGNOSIS — I120 Hypertensive chronic kidney disease with stage 5 chronic kidney disease or end stage renal disease: Secondary | ICD-10-CM | POA: Diagnosis not present

## 2018-07-12 DIAGNOSIS — G479 Sleep disorder, unspecified: Secondary | ICD-10-CM | POA: Insufficient documentation

## 2018-07-12 DIAGNOSIS — Z940 Kidney transplant status: Secondary | ICD-10-CM | POA: Insufficient documentation

## 2018-07-12 DIAGNOSIS — E1122 Type 2 diabetes mellitus with diabetic chronic kidney disease: Secondary | ICD-10-CM | POA: Insufficient documentation

## 2018-07-12 DIAGNOSIS — N186 End stage renal disease: Secondary | ICD-10-CM | POA: Diagnosis not present

## 2018-07-12 DIAGNOSIS — E1142 Type 2 diabetes mellitus with diabetic polyneuropathy: Secondary | ICD-10-CM | POA: Insufficient documentation

## 2018-07-12 DIAGNOSIS — Z8619 Personal history of other infectious and parasitic diseases: Secondary | ICD-10-CM | POA: Diagnosis not present

## 2018-07-12 DIAGNOSIS — Z833 Family history of diabetes mellitus: Secondary | ICD-10-CM | POA: Insufficient documentation

## 2018-07-12 MED ORDER — NORTRIPTYLINE HCL 25 MG PO CAPS: 25.0000 mg | ORAL_CAPSULE | Freq: Every day | ORAL | 1 refills | Status: DC

## 2018-07-12 MED ORDER — GABAPENTIN 100 MG PO CAPS: ORAL_CAPSULE | ORAL | 1 refills | Status: DC

## 2018-07-12 NOTE — Progress Notes (Signed)
Subjective:    Patient ID: Wanda [name], female    DOB: 04-27-1961, 57 y.o.   MRN: [id number]  HPI Female with pmh/psh of ESRD on HD, hypotension, migraines, DM with neuropathy, renal transplant, gastric sleeve, presents for follow up for diabetic/HD associated neuropathy.   Initially stated: Started ~4 years ago after dialysis, getting worse. Located mid-shins distally and hands.  Worst on dialysis days.  Rest improves the pain.  Laying down exacerbates the pain.  Pin/needles.  Constant. Lyrica provides temporary benefit.  Gabapentin does not help.  Capsaisin does not help.  Denies associated weakness.  Denies falls. Makes it difficult to sleep.    Last clinic visit 04/12/18.  Since that time, pt states she is doing slightly better. However, she does note decrease in benefit from generic Lyrica and would like to trial Gabapentin again. She did not get her blood drawn.  She states she never received a call regarding TENS unit. She spoke with Nephro and started on vasodilator cream with limited benefit. Cough has improved with meds.   Pain Inventory Average Pain 7 Pain Right Now 7 My pain is sharp, burning, stabbing, tingling and aching  In the last 24 hours, has pain interfered with the following? General activity 4 Relation with others 4 Enjoyment of life 4 What TIME of day is your pain at its worst? evening and night Sleep (in general) Poor  Pain is worse with: bending and sitting Pain improves with: heat/ice and medication Relief from Meds: 1  Mobility walk without assistance ability to climb steps?  yes do you drive?  yes Do you have any goals in this area?  no  Function retired  Neuro/Psych numbness tingling  Prior Studies Any changes since last visit?  no  Physicians involved in your care Any changes since last visit?  no   Family History  Problem Relation Age of Onset  . Colon cancer Father        deceased at age 6  . Diabetes Brother   . Neuropathy  Neg Hx    Social History   Socioeconomic History  . Marital status: Married    Spouse name: [name]  . Number of children: 2  . Years of education: 37  . Highest education level: Not on file  Occupational History  . Occupation: Retired- disabled  Social Needs  . Financial resource strain: Not on file  . Food insecurity:    Worry: Not on file    Inability: Not on file  . Transportation needs:    Medical: Not on file    Non-medical: Not on file  Tobacco Use  . Smoking status: Never Smoker  . Smokeless tobacco: Never Used  Substance and Sexual Activity  . Alcohol use: No  . Drug use: No  . Sexual activity: Yes  Lifestyle  . Physical activity:    Days per week: Not on file    Minutes per session: Not on file  . Stress: Not on file  Relationships  . Social connections:    Talks on phone: Not on file    Gets together: Not on file    Attends religious service: Not on file    Active member of club or organization: Not on file    Attends meetings of clubs or organizations: Not on file    Relationship status: Not on file  Other Topics Concern  . Not on file  Social History Narrative   Lives with husband and child.   Caffeine use:  none   Past Surgical History:  Procedure Laterality Date  . AV FISTULA PLACEMENT Right 2000   "lower arm" (07/24/2013)  . AV FISTULA PLACEMENT, RADIOCEPHALIC Right 4166   "upper arm" (07/24/2013)  . CAPD REMOVAL N/A 10/10/2016   Procedure: CONTINUOUS AMBULATORY PERITONEAL DIALYSIS  (CAPD) CATHETER REMOVAL;  Surgeon: Donnie Mesa, MD;  Location: [place];  Service: General;  Laterality: N/A;  . CATARACT EXTRACTION W/ INTRAOCULAR LENS  IMPLANT, BILATERAL Bilateral 2011  . [place]; 1987  . CHOLECYSTECTOMY  2011  . COLONOSCOPY    . ENDOMETRIAL CRYOABLATION  1980's  . INTRAUTERINE DEVICE (IUD) INSERTION  2011  . LAPAROSCOPIC GASTRIC SLEEVE RESECTION N/A 05/26/2015   Procedure: LAPAROSCOPIC GASTRIC SLEEVE RESECTION;  Surgeon: Excell Seltzer, MD;  [facility];  Service: General;  Laterality: N/A;  . TRANSPLANTATION RENAL  2013   "right" (09/04/2012)  . TUBAL LIGATION  1987  . [place]   "RLE; stripped" (09/04/2012)  . VIDEO BRONCHOSCOPY  09/07/2012   Procedure: VIDEO BRONCHOSCOPY WITHOUT FLUORO;  Surgeon: Chesley Mires, MD;  Location: [facility] ENDOSCOPY;  Service: Cardiopulmonary;  Laterality: Bilateral;   Past Medical History:  Diagnosis Date  . Anemia   . Arthritis    "hands" (07/24/2013) pt denies  . Chronic kidney disease, stage IV (severe) ([place])    "never went on dialysis" (07/24/2013)  . Diabetes mellitus type II   . Diabetic peripheral neuropathy ([place])   . Dialysis patient [facility])    Monday Wednesday Friday   . Dyspnea on exertion    pt denies  . Foot pain   . GERD (gastroesophageal reflux disease)   . Gout   . History of chicken pox   . History of measles   . History of mumps   . Hyperlipidemia   . Hypothyroidism   . Migraines    "q 3 months" (07/24/2013)  . Nonspecific abnormal unspecified cardiovascular function study   . Obesity   . PAD (peripheral artery disease) ([place])   . Peripheral neuropathy   . Peripheral vascular disease ([place])   . Pneumonia 09/2012  . Renal transplant disorder   . Shingles   . Sleep apnea    "getting ready to get tested again cause dr says I have it" (07/24/2013) pt states was tested and was told she did not have sleep apnea / no cpap  . Unspecified essential hypertension    BP 117/79   Pulse 97   Resp 14   Ht 5\' 9"  (1.753 m)   Wt 214 lb (97.1 kg)   SpO2 95%   BMI 31.60 kg/m   Opioid Risk Score:  0 Fall Risk Score:  `1  Depression screen PHQ 2/9  Depression screen [facility] 2/9 01/18/2018 10/12/2017 08/31/2017 08/18/2015 07/14/2015 04/27/2015  Decreased Interest 0 0 0 0 0 0  Down, Depressed, Hopeless 0 0 0 0 0 0  PHQ - 2 Score 0 0 0 0 0 0    Review of Systems  Constitutional: Positive for unexpected weight change.       Low blood sugar  HENT:  Negative.   Eyes: Negative.   Respiratory: Negative.   Cardiovascular: Negative.   Endocrine: Negative.        High, low blood sugar  Genitourinary: Negative.   Musculoskeletal: Positive for arthralgias.   Skin: Negative.   Allergic/Immunologic: Negative.   Neurological: Positive for  numbness.       Dyesthesias, neuropathy, tingling  Hematological: Negative.   Psychiatric/Behavioral: The  patient is nervous/anxious.   All other systems reviewed and are negative.     Objective:   Physical Exam Gen: NAD. Vital signs reviewed HENT: Normocephalic, Atraumatic Eyes: EOMI. No discharge.  Cardio: RRR. No JVD. Pulm: B/l clear to auscultation.  Effort normal Abd: Soft, BS+ MSK:  Gait WNL.   No TTP.   Neuro:   Sensation diminished to light touch in b/l feet  Strength  5/5 in all LE myotomes Skin: Warm and Dry. Intact    Assessment & Plan:  Female with pmh/psh of ESRD on HD, hypotension, migraines, DM with neuropathy, renal transplant, gastric sleeve, presents for follow up for diabetic/HD associated neuropathy.    1. Diabetic neuropathy  Heat/Cold, Lidoderm patches, compound medications, Voltaren gel, Capsaicin, Elavil ineffective  Side effects with Gabapentin, but states she would like to try again.  Will order Gabapentin 100 daily, with additional 100 post dialysis  Pamelor 25mg  BID  Vit B12 WNL  Lyrica to 75 AM and 150 after HD (discussed timing of medications) ineffective after transition to generic, patient to check with insurance regarding brandname  Cont Epson salt baths  Ordered TENS IT, pt states she continues to await call back, will follow up again  Vasodilator creams with limited benefit   2. Sleep disturbance  Poor on dialysis days  Cont Nortriptyline   3. Vitamin D deficiency  Vit D 9.4 on 2/20  Will reorder labs again, pt lost last script  4. GERD  Cont meds per [facility]rho

## 2018-07-14 ENCOUNTER — Encounter (HOSPITAL_COMMUNITY): Payer: Self-pay | Admitting: *Deleted

## 2018-07-14 ENCOUNTER — Emergency Department (HOSPITAL_COMMUNITY)
Admission: EM | Admit: 2018-07-14 | Discharge: 2018-07-14 | Disposition: A | Discharge status: Home / Self Care | Payer: Medicare HMO | Attending: Emergency Medicine | Admitting: Emergency Medicine

## 2018-07-14 DIAGNOSIS — I120 Hypertensive chronic kidney disease with stage 5 chronic kidney disease or end stage renal disease: Secondary | ICD-10-CM | POA: Insufficient documentation

## 2018-07-14 DIAGNOSIS — R51 Headache: Secondary | ICD-10-CM | POA: Diagnosis present

## 2018-07-14 DIAGNOSIS — Z992 Dependence on renal dialysis: Secondary | ICD-10-CM | POA: Insufficient documentation

## 2018-07-14 DIAGNOSIS — Z711 Person with feared health complaint in whom no diagnosis is made: Secondary | ICD-10-CM | POA: Diagnosis not present

## 2018-07-14 DIAGNOSIS — E119 Type 2 diabetes mellitus without complications: Secondary | ICD-10-CM | POA: Insufficient documentation

## 2018-07-14 DIAGNOSIS — N186 End stage renal disease: Secondary | ICD-10-CM | POA: Insufficient documentation

## 2018-07-14 DIAGNOSIS — E039 Hypothyroidism, unspecified: Secondary | ICD-10-CM | POA: Diagnosis not present

## 2018-07-14 LAB — BLOOD GAS, ARTERIAL
Acid-Base Excess: 6.9 mmol/L — ABNORMAL HIGH (ref 0.0–2.0)
Bicarbonate: 31.4 mmol/L — ABNORMAL HIGH (ref 20.0–28.0)
Drawn by: 331471
O2 Content: 9 L/min
O2 Saturation: 99.4 %
Patient temperature: 98.6
pCO2 arterial: 45.4 mmHg (ref 32.0–48.0)
pH, Arterial: 7.455 — ABNORMAL HIGH (ref 7.350–7.450)
pO2, Arterial: 354 mmHg — ABNORMAL HIGH (ref 83.0–108.0)

## 2018-07-14 LAB — COOXEMETRY PANEL
Carboxyhemoglobin: 1.6 % — ABNORMAL HIGH (ref 0.5–1.5)
Methemoglobin: 0.5 % (ref 0.0–1.5)
O2 Saturation: 98.1 %
Total hemoglobin: 13.9 g/dL (ref 12.0–16.0)

## 2018-07-14 LAB — CBG MONITORING, ED: Glucose-Capillary: 208 mg/dL — ABNORMAL HIGH (ref 70–99)

## 2018-07-14 NOTE — ED Provider Notes (Signed)
[facility] DEPT Provider Note   CSN: [id number] Arrival date & time: 07/14/18  4967     History   Chief Complaint Chief Complaint  Patient presents with  . Toxic Inhalation  . Headache  . Dizziness    HPI Wanda [name] is a 57 y.o. female with a past medical history of diabetes, ESRD on dialysis, PAD who presents to ED for evaluation of headache and dizziness since this morning.  States that she was sleeping at home with her husband and grandchild when they heard the carbon monoxide alarm go off.  She states that she smelled a foul smelling gas when the alarm went off.  Since then, she has had some dizziness.  She was feeling fine before she went to bed last night.  Denies any vision changes, vomiting, chest pain, shortness of breath, numbness in arms or legs, injuries or falls, syncope.  HPI  Past Medical History:  Diagnosis Date  . Anemia   . Arthritis    "hands" (07/24/2013) pt denies  . Chronic kidney disease, stage IV (severe) ([place])    "never went on dialysis" (07/24/2013)  . Diabetes mellitus type II   . Diabetic peripheral neuropathy ([place])   . Dialysis patient [facility])    Monday Wednesday Friday   . Dyspnea on exertion    pt denies  . Foot pain   . GERD (gastroesophageal reflux disease)   . Gout   . History of chicken pox   . History of measles   . History of mumps   . Hyperlipidemia   . Hypothyroidism   . Migraines    "q 3 months" (07/24/2013)  . Nonspecific abnormal unspecified cardiovascular function study   . Obesity   . PAD (peripheral artery disease) ([place])   . Peripheral neuropathy   . Peripheral vascular disease ([place])   . Pneumonia 09/2012  . Renal transplant disorder   . Shingles   . Sleep apnea    "getting ready to get tested again cause dr says I have it" (07/24/2013) pt states was tested and was told she did not have sleep apnea / no cpap  . Unspecified essential hypertension     Patient Active Problem List     Diagnosis Date Noted  . A-V fistula ([place]) 02/08/2018  . 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 Hypokalemia 10/07/2016  . Peritonitis ([place]) 10/07/2016  . SBP (spontaneous bacterial peritonitis) ([place]) 10/07/2016  . Atrial fibrillation ([place]) 02/24/2016  . Depression 02/24/2016  . Stroke ([place]) 02/24/2016  . Morbid obesity ([place]) 05/26/2015  . Burn any degree involving less than 10 percent of body surface 04/21/2015  . Acute pain due to trauma 04/21/2015  . Neuropathic pain 04/21/2015  . Peripheral neuropathy 04/21/2015  . Hypoglycemia 07/24/2013  . Nausea and vomiting 01/08/2013  . Fever 01/08/2013  . Renal failure (ARF), acute on chronic (HCC) 01/08/2013  . Acute kidney failure ([place]) 01/08/2013  . Renal transplant disorder 09/04/2012  . Hyponatremia 09/04/2012  . CKD (chronic kidney disease) 09/04/2012  . HTN (hypertension) 09/04/2012  . Healthcare-associated pneumonia 09/04/2012  . Anemia 09/04/2012  . Urinary retention 07/16/2012  . History of kidney transplant 05/18/2012  . Acquired absence of other specified parts of digestive tract 05/17/2012  . Cough 03/22/2011  . SHINGLES 12/21/2009  . HYPOTHYROIDISM 12/21/2009  . DM (diabetes mellitus), type 2 with renal complications ([place]) 59/16/3846  . HYPERLIPIDEMIA 12/21/2009  . OBESITY 12/21/2009  . DYSPNEA 12/21/2009  . MIGRAINES, HX OF 12/21/2009  .  HYPERTENSION, UNSPECIFIED 11/20/2009  . CHRONIC KIDNEY DISEASE STAGE IV (SEVERE) 11/20/2009  . DYSPNEA ON EXERTION 11/20/2009  . NONSPECIFIC ABNORMAL UNSPEC CV FUNCTION STUDY 11/20/2009    Past Surgical History:  Procedure Laterality Date  . AV FISTULA PLACEMENT Right 2000   "lower arm" (07/24/2013)  . AV FISTULA PLACEMENT, RADIOCEPHALIC Right 4196   "upper arm" (07/24/2013)  . CAPD REMOVAL N/A 10/10/2016   Procedure: CONTINUOUS AMBULATORY PERITONEAL DIALYSIS  (CAPD) CATHETER REMOVAL;  Surgeon: Donnie Mesa, MD;  Location: [facility];  Service: General;  Laterality: N/A;   . CATARACT EXTRACTION W/ INTRAOCULAR LENS  IMPLANT, BILATERAL Bilateral 2011  . [place]; 1987  . CHOLECYSTECTOMY  2011  . COLONOSCOPY    . ENDOMETRIAL CRYOABLATION  1980's  . INTRAUTERINE DEVICE (IUD) INSERTION  2011  . LAPAROSCOPIC GASTRIC SLEEVE RESECTION N/A 05/26/2015   Procedure: LAPAROSCOPIC GASTRIC SLEEVE RESECTION;  Surgeon: Excell Seltzer, MD;  [facility];  Service: General;  Laterality: N/A;  . TRANSPLANTATION RENAL  2013   "right" (09/04/2012)  . TUBAL LIGATION  1987  . [place]   "RLE; stripped" (09/04/2012)  . VIDEO BRONCHOSCOPY  09/07/2012   Procedure: VIDEO BRONCHOSCOPY WITHOUT FLUORO;  Surgeon: Chesley Mires, MD;  Location: [facility] ENDOSCOPY;  Service: Cardiopulmonary;  Laterality: Bilateral;     OB History   None      Home Medications    Prior to Admission medications   Medication Sig Start Date End Date Taking? Authorizing Provider  allopurinol (ZYLOPRIM) 100 MG tablet Take 2 tablets (200 mg total) by mouth daily. 12/02/17  Yes Neese, [place], NP  amLODipine (NORVASC) 5 MG tablet Take by mouth.   Yes [provider]  calcitRIOL (ROCALTROL) 0.25 MCG capsule Take by mouth daily. 12/29/17  Yes [provider]  clonazePAM (KLONOPIN) 1 MG tablet Take by mouth.   Yes [provider]  colchicine (COLCRYS) 0.6 MG tablet Take 0.6 mg by mouth daily as needed (gout).  12/30/15  Yes [provider]  famotidine (PEPCID) 20 MG tablet Take 20 mg by mouth daily as needed for heartburn. 06/30/18  Yes [provider]  gabapentin (NEURONTIN) 100 MG capsule Take 100mg  daily plus an additional 100mg  post-dialysis 07/12/18  Yes Patel, Domenick Bookbinder, MD  hydrOXYzine (ATARAX/VISTARIL) 10 MG tablet Take 10 mg by mouth 2 (two) times daily as needed for itching. 04/19/18  Yes [provider]  indomethacin (INDOCIN) 25 MG capsule Take 25 mg by mouth daily as needed for pain. 06/27/18  Yes [provider]   insulin aspart (NOVOLOG FLEXPEN) 100 UNIT/ML FlexPen Inject 12 Units into the skin 3 (three) times daily with meals. And pen needles 3/day 02/22/18  Yes Renato Shin, MD  L-Methylfolate-Algae-B12-B6 [facility]) 3-90.314-2-35 MG CAPS Take 1 tablet by mouth daily. 01/18/17  Yes [provider]  midodrine (PROAMATINE) 10 MG tablet TAKE 1 TABLET 30 MINS PRIOR TO DIALYSIS ON MOND/WED/FRIDAY,MAY REPEAT MID TUES/THUR IF NEED 01/20/18  Yes [provider]  nortriptyline (PAMELOR) 25 MG capsule Take 1 capsule (25 mg total) by mouth at bedtime. 07/12/18  Yes Jamse Arn, MD  pantoprazole (PROTONIX) 40 MG tablet Take 40 mg by mouth daily. 01/16/18  Yes [provider]  rOPINIRole (REQUIP) 0.25 MG tablet 0.25 mg daily as needed (neuropathy).  01/29/18  Yes [provider]  sevelamer carbonate (RENVELA) 2.4 g PACK Take 2.4 g by mouth See admin instructions. Sprinkle 1 packet (2.4 g) with every meal and with snacks  Yes [provider]  traZODone (DESYREL) 100 MG tablet Take 100 mg by mouth at bedtime as needed for sleep. 06/13/18  Yes [provider]  Vitamin D, Ergocalciferol, (DRISDOL) 50000 units CAPS capsule TAKE 1 CAPSULE (50,000 UNITS TOTAL) BY MOUTH EVERY 7 (SEVEN) DAYS. 03/29/18  Yes Jamse Arn, MD    Family History Family History  Problem Relation Age of Onset  . Colon cancer Father        deceased at age 41  . Diabetes Brother   . Neuropathy Neg Hx     Social History Social History   Tobacco Use  . Smoking status: Never Smoker  . Smokeless tobacco: Never Used  Substance Use Topics  . Alcohol use: No  . Drug use: No     Allergies   Hydrocodone-acetaminophen; Propoxyphene n-acetaminophen; and Cephalosporins   Review of Systems Review of Systems  Constitutional: Negative for appetite change, chills and fever.  HENT: Negative for ear pain, rhinorrhea, sneezing and sore throat.   Eyes: Negative for photophobia and visual  disturbance.  Respiratory: Negative for cough, chest tightness, shortness of breath and wheezing.   Cardiovascular: Negative for chest pain and palpitations.  Gastrointestinal: Negative for abdominal pain, blood in stool, constipation, diarrhea, nausea and vomiting.  Genitourinary: Negative for dysuria, hematuria and urgency.  Musculoskeletal: Negative for myalgias.  Skin: Negative for rash.  Neurological: Positive for light-headedness and headaches. Negative for dizziness and weakness.     Physical Exam Updated Vital Signs BP (!) 142/79   Pulse 98   Temp 98 F (36.7 C) (Oral)   Resp 16   SpO2 100%   Physical Exam  Constitutional: She is oriented to person, place, and time. She appears well-developed and well-nourished. No distress.  HENT:  Head: Normocephalic and atraumatic.  Nose: Nose normal.  Eyes: Pupils are equal, round, and reactive to light. Conjunctivae and EOM are normal. Right eye exhibits no discharge. Left eye exhibits no discharge. No scleral icterus.  Neck: Normal range of motion. Neck supple.  Cardiovascular: Normal rate, regular rhythm, normal heart sounds and intact distal pulses. Exam reveals no gallop and no friction rub.  No murmur heard. Pulmonary/Chest: Effort normal and breath sounds normal. No respiratory distress.  Abdominal: Soft. Bowel sounds are normal. She exhibits no distension. There is no tenderness. There is no guarding.  Musculoskeletal: Normal range of motion. She exhibits no edema.  Neurological: She is alert and oriented to person, place, and time. No cranial nerve deficit or sensory deficit. She exhibits normal muscle tone. Coordination normal.  Pupils reactive. No facial asymmetry noted. Cranial nerves appear grossly intact. Sensation intact to light touch on face, BUE and BLE. Strength 5/5 in BUE and BLE.   Skin: Skin is warm and dry. No rash noted.  Psychiatric: She has a normal mood and affect.  Nursing note and vitals reviewed.    ED  Treatments / Results  Labs (all labs ordered are listed, but only abnormal results are displayed) Labs Reviewed  COOXEMETRY PANEL - Abnormal; Notable for the following components:      Result Value   Carboxyhemoglobin 1.6 (*)    All other components within normal limits  BLOOD GAS, ARTERIAL - Abnormal; Notable for the following components:   pH, Arterial 7.455 (*)    pO2, Arterial 354 (*)    Bicarbonate 31.4 (*)    Acid-Base Excess 6.9 (*)    All other components within normal limits  CBG MONITORING, ED    EKG None  Radiology No results found.  Procedures Procedures (including critical care time)  Medications Ordered in ED Medications - No data to display   Initial Impression / Assessment and Plan / ED Course  I have reviewed the triage vital signs and the nursing notes.  Pertinent labs & imaging results that were available during my care of the patient were reviewed by me and considered in my medical decision making (see chart for details).     57 year old female with a past medical history of ESRD on dialysis, diabetes, who presents to ED for possible carbon monoxide exposure.  States that she was sleeping in her house with her husband and grandchild when they heard the carbon monoxide detector go off.  She has had dizziness and a headache since then.  Denies any symptoms prior to alarm going off.  Physical exam findings are unremarkable.  Lungs are clear to auscultation bilaterally.  She is not hypoxic, tachycardic or tachypneic.  No deficits on neurological exam noted.  ABG and call oximetry panel are unremarkable with no concerning findings c/w CO poisoning.  Patient was placed on nonrebreather with significant improvement in her symptoms.  Vital signs remain normal.  Will advise patient follow-up with her PCP but given strict return precautions for returning to the ED. Patient discussed with my attending, Dr. Darl Householder.  Portions of this note were generated with Geographical information systems officer. Dictation errors may occur despite best attempts at proofreading.   Final Clinical Impressions(s) / ED Diagnoses   Final diagnoses:  Feared complaint without diagnosis    ED Discharge Orders    None       Delia Heady, PA-C 07/14/18 0930    Drenda Freeze, MD 07/14/18 1422

## 2018-07-14 NOTE — ED Triage Notes (Signed)
Pt complains of headache and dizziness since this morning. Pt states carbon monoxide alarm went off this morning. Pt's daughter turned on the heat last night but pilot light did not come on.

## 2018-07-14 NOTE — Discharge Instructions (Addendum)
Return to ED for worsening symptoms, headache, chest pain, shortness of breath, lightheadedness, loss of consciousness, vomiting or coughing up blood. Please ask the fire department to check your household for carbon monoxide levels to ensure that it is safe for you and your family to return home.

## 2018-07-14 NOTE — ED Notes (Signed)
Pt placed on non-rebreather mask per MD order for tx of CO exposure.

## 2018-07-18 ENCOUNTER — Other Ambulatory Visit: Payer: Self-pay | Admitting: Obstetrics and Gynecology

## 2018-07-18 DIAGNOSIS — Z803 Family history of malignant neoplasm of breast: Secondary | ICD-10-CM

## 2018-08-04 ENCOUNTER — Other Ambulatory Visit: Payer: Self-pay | Admitting: Physical Medicine & Rehabilitation

## 2018-08-07 ENCOUNTER — Inpatient Hospital Stay
Admission: RE | Admit: 2018-08-07 | Discharge: 2018-08-07 | Disposition: A | Discharge status: Home / Self Care | Payer: Self-pay | Source: Ambulatory Visit | Attending: Obstetrics and Gynecology | Admitting: Obstetrics and Gynecology

## 2018-08-09 ENCOUNTER — Encounter: Payer: Medicare HMO | Admitting: Physical Medicine & Rehabilitation

## 2018-08-23 ENCOUNTER — Encounter: Payer: Medicare HMO | Attending: Physical Medicine & Rehabilitation | Admitting: Physical Medicine & Rehabilitation

## 2018-08-23 DIAGNOSIS — K219 Gastro-esophageal reflux disease without esophagitis: Secondary | ICD-10-CM | POA: Insufficient documentation

## 2018-08-23 DIAGNOSIS — Z8619 Personal history of other infectious and parasitic diseases: Secondary | ICD-10-CM | POA: Insufficient documentation

## 2018-08-23 DIAGNOSIS — N186 End stage renal disease: Secondary | ICD-10-CM | POA: Insufficient documentation

## 2018-08-23 DIAGNOSIS — Z9889 Other specified postprocedural states: Secondary | ICD-10-CM | POA: Insufficient documentation

## 2018-08-23 DIAGNOSIS — Z9851 Tubal ligation status: Secondary | ICD-10-CM | POA: Insufficient documentation

## 2018-08-23 DIAGNOSIS — G479 Sleep disorder, unspecified: Secondary | ICD-10-CM | POA: Insufficient documentation

## 2018-08-23 DIAGNOSIS — E1142 Type 2 diabetes mellitus with diabetic polyneuropathy: Secondary | ICD-10-CM | POA: Insufficient documentation

## 2018-08-23 DIAGNOSIS — M109 Gout, unspecified: Secondary | ICD-10-CM | POA: Insufficient documentation

## 2018-08-23 DIAGNOSIS — I120 Hypertensive chronic kidney disease with stage 5 chronic kidney disease or end stage renal disease: Secondary | ICD-10-CM | POA: Insufficient documentation

## 2018-08-23 DIAGNOSIS — E1122 Type 2 diabetes mellitus with diabetic chronic kidney disease: Secondary | ICD-10-CM | POA: Insufficient documentation

## 2018-08-23 DIAGNOSIS — E039 Hypothyroidism, unspecified: Secondary | ICD-10-CM | POA: Insufficient documentation

## 2018-08-23 DIAGNOSIS — E785 Hyperlipidemia, unspecified: Secondary | ICD-10-CM | POA: Insufficient documentation

## 2018-08-23 DIAGNOSIS — Z808 Family history of malignant neoplasm of other organs or systems: Secondary | ICD-10-CM | POA: Insufficient documentation

## 2018-08-23 DIAGNOSIS — Z940 Kidney transplant status: Secondary | ICD-10-CM | POA: Insufficient documentation

## 2018-08-23 DIAGNOSIS — Z833 Family history of diabetes mellitus: Secondary | ICD-10-CM | POA: Insufficient documentation

## 2018-09-11 ENCOUNTER — Other Ambulatory Visit: Payer: Self-pay | Admitting: Physical Medicine & Rehabilitation

## 2018-10-25 ENCOUNTER — Encounter: Payer: Medicare HMO | Attending: Physical Medicine & Rehabilitation | Admitting: Physical Medicine & Rehabilitation

## 2018-10-25 DIAGNOSIS — E039 Hypothyroidism, unspecified: Secondary | ICD-10-CM | POA: Insufficient documentation

## 2018-10-25 DIAGNOSIS — N186 End stage renal disease: Secondary | ICD-10-CM | POA: Insufficient documentation

## 2018-10-25 DIAGNOSIS — E1122 Type 2 diabetes mellitus with diabetic chronic kidney disease: Secondary | ICD-10-CM | POA: Insufficient documentation

## 2018-10-25 DIAGNOSIS — Z9889 Other specified postprocedural states: Secondary | ICD-10-CM | POA: Insufficient documentation

## 2018-10-25 DIAGNOSIS — Z9851 Tubal ligation status: Secondary | ICD-10-CM | POA: Insufficient documentation

## 2018-10-25 DIAGNOSIS — Z833 Family history of diabetes mellitus: Secondary | ICD-10-CM | POA: Insufficient documentation

## 2018-10-25 DIAGNOSIS — K219 Gastro-esophageal reflux disease without esophagitis: Secondary | ICD-10-CM | POA: Insufficient documentation

## 2018-10-25 DIAGNOSIS — E785 Hyperlipidemia, unspecified: Secondary | ICD-10-CM | POA: Insufficient documentation

## 2018-10-25 DIAGNOSIS — I120 Hypertensive chronic kidney disease with stage 5 chronic kidney disease or end stage renal disease: Secondary | ICD-10-CM | POA: Insufficient documentation

## 2018-10-25 DIAGNOSIS — E1142 Type 2 diabetes mellitus with diabetic polyneuropathy: Secondary | ICD-10-CM | POA: Insufficient documentation

## 2018-10-25 DIAGNOSIS — Z8619 Personal history of other infectious and parasitic diseases: Secondary | ICD-10-CM | POA: Insufficient documentation

## 2018-10-25 DIAGNOSIS — G479 Sleep disorder, unspecified: Secondary | ICD-10-CM | POA: Insufficient documentation

## 2018-10-25 DIAGNOSIS — Z808 Family history of malignant neoplasm of other organs or systems: Secondary | ICD-10-CM | POA: Insufficient documentation

## 2018-10-25 DIAGNOSIS — M109 Gout, unspecified: Secondary | ICD-10-CM | POA: Insufficient documentation

## 2018-10-25 DIAGNOSIS — Z940 Kidney transplant status: Secondary | ICD-10-CM | POA: Insufficient documentation

## 2018-11-09 LAB — VITAMIN D 25 HYDROXY (VIT D DEFICIENCY, FRACTURES): Vit D, 25-Hydroxy: 13.8 ng/mL — ABNORMAL LOW (ref 30.0–100.0)

## 2018-11-22 ENCOUNTER — Ambulatory Visit: Payer: Self-pay | Admitting: Physical Medicine & Rehabilitation

## 2018-11-29 ENCOUNTER — Encounter: Payer: Medicare HMO | Attending: Physical Medicine & Rehabilitation | Admitting: Physical Medicine & Rehabilitation

## 2018-11-29 DIAGNOSIS — E039 Hypothyroidism, unspecified: Secondary | ICD-10-CM | POA: Insufficient documentation

## 2018-11-29 DIAGNOSIS — N186 End stage renal disease: Secondary | ICD-10-CM | POA: Insufficient documentation

## 2018-11-29 DIAGNOSIS — Z808 Family history of malignant neoplasm of other organs or systems: Secondary | ICD-10-CM | POA: Insufficient documentation

## 2018-11-29 DIAGNOSIS — E1142 Type 2 diabetes mellitus with diabetic polyneuropathy: Secondary | ICD-10-CM | POA: Insufficient documentation

## 2018-11-29 DIAGNOSIS — K219 Gastro-esophageal reflux disease without esophagitis: Secondary | ICD-10-CM | POA: Insufficient documentation

## 2018-11-29 DIAGNOSIS — Z9889 Other specified postprocedural states: Secondary | ICD-10-CM | POA: Insufficient documentation

## 2018-11-29 DIAGNOSIS — Z9851 Tubal ligation status: Secondary | ICD-10-CM | POA: Insufficient documentation

## 2018-11-29 DIAGNOSIS — Z833 Family history of diabetes mellitus: Secondary | ICD-10-CM | POA: Insufficient documentation

## 2018-11-29 DIAGNOSIS — E1122 Type 2 diabetes mellitus with diabetic chronic kidney disease: Secondary | ICD-10-CM | POA: Insufficient documentation

## 2018-11-29 DIAGNOSIS — M109 Gout, unspecified: Secondary | ICD-10-CM | POA: Insufficient documentation

## 2018-11-29 DIAGNOSIS — Z940 Kidney transplant status: Secondary | ICD-10-CM | POA: Insufficient documentation

## 2018-11-29 DIAGNOSIS — Z8619 Personal history of other infectious and parasitic diseases: Secondary | ICD-10-CM | POA: Insufficient documentation

## 2018-11-29 DIAGNOSIS — G479 Sleep disorder, unspecified: Secondary | ICD-10-CM | POA: Insufficient documentation

## 2018-11-29 DIAGNOSIS — E785 Hyperlipidemia, unspecified: Secondary | ICD-10-CM | POA: Insufficient documentation

## 2018-11-29 DIAGNOSIS — I120 Hypertensive chronic kidney disease with stage 5 chronic kidney disease or end stage renal disease: Secondary | ICD-10-CM | POA: Insufficient documentation

## 2018-12-18 ENCOUNTER — Other Ambulatory Visit: Payer: Self-pay | Admitting: Physical Medicine & Rehabilitation

## 2019-01-31 ENCOUNTER — Other Ambulatory Visit: Payer: Self-pay | Admitting: Physical Medicine & Rehabilitation

## 2019-02-10 ENCOUNTER — Other Ambulatory Visit: Payer: Self-pay | Admitting: Physical Medicine & Rehabilitation

## 2019-03-15 ENCOUNTER — Encounter (HOSPITAL_COMMUNITY): Payer: Self-pay | Admitting: Emergency Medicine

## 2019-03-15 ENCOUNTER — Other Ambulatory Visit: Payer: Self-pay

## 2019-03-15 ENCOUNTER — Emergency Department (HOSPITAL_COMMUNITY): Payer: Medicare HMO

## 2019-03-15 ENCOUNTER — Emergency Department (HOSPITAL_COMMUNITY)
Admission: EM | Admit: 2019-03-15 | Discharge: 2019-03-15 | Disposition: A | Discharge status: Home / Self Care | Payer: Medicare HMO | Attending: Emergency Medicine | Admitting: Emergency Medicine

## 2019-03-15 DIAGNOSIS — R059 Cough, unspecified: Secondary | ICD-10-CM

## 2019-03-15 DIAGNOSIS — R05 Cough: Secondary | ICD-10-CM

## 2019-03-15 DIAGNOSIS — Z79899 Other long term (current) drug therapy: Secondary | ICD-10-CM | POA: Diagnosis not present

## 2019-03-15 DIAGNOSIS — J069 Acute upper respiratory infection, unspecified: Secondary | ICD-10-CM | POA: Diagnosis not present

## 2019-03-15 DIAGNOSIS — Z992 Dependence on renal dialysis: Secondary | ICD-10-CM | POA: Insufficient documentation

## 2019-03-15 DIAGNOSIS — Z8673 Personal history of transient ischemic attack (TIA), and cerebral infarction without residual deficits: Secondary | ICD-10-CM | POA: Insufficient documentation

## 2019-03-15 DIAGNOSIS — Z794 Long term (current) use of insulin: Secondary | ICD-10-CM | POA: Insufficient documentation

## 2019-03-15 DIAGNOSIS — E039 Hypothyroidism, unspecified: Secondary | ICD-10-CM | POA: Insufficient documentation

## 2019-03-15 DIAGNOSIS — I120 Hypertensive chronic kidney disease with stage 5 chronic kidney disease or end stage renal disease: Secondary | ICD-10-CM | POA: Insufficient documentation

## 2019-03-15 DIAGNOSIS — Z20828 Contact with and (suspected) exposure to other viral communicable diseases: Secondary | ICD-10-CM | POA: Insufficient documentation

## 2019-03-15 DIAGNOSIS — Z940 Kidney transplant status: Secondary | ICD-10-CM | POA: Insufficient documentation

## 2019-03-15 DIAGNOSIS — N186 End stage renal disease: Secondary | ICD-10-CM | POA: Insufficient documentation

## 2019-03-15 DIAGNOSIS — E1122 Type 2 diabetes mellitus with diabetic chronic kidney disease: Secondary | ICD-10-CM | POA: Insufficient documentation

## 2019-03-15 LAB — SARS CORONAVIRUS 2 BY RT PCR (HOSPITAL ORDER, PERFORMED IN ~~LOC~~ HOSPITAL LAB): SARS Coronavirus 2: NEGATIVE

## 2019-03-15 NOTE — ED Provider Notes (Signed)
Care assumed from [place], please see her note for full details, but in brief Wanda [name] is a 58 y.o. female with a history of ESRD on dialysis, who presents with cough, husband is here for the same.  No fevers and well-appearing on arrival with normal chest x-ray.  COVID test pending so the patient can return to dialysis.  Labs Reviewed  SARS CORONAVIRUS 2 (HOSPITAL ORDER, May LAB)   Dg Chest Port 1 View  Result Date: 03/15/2019 CLINICAL DATA:  Pt to ED for rapid COVID-19 test in order to receive dialysis. Pt states she may have had an exposure last Saturday. Pt c/o dry cough for 1 week, denies fever, SOB, or CP. Pt hx shingles, diabetes, PNA. Pt is a nonsmoker EXAM: PORTABLE CHEST 1 VIEW COMPARISON:  10/16/2017 FINDINGS: The heart size and mediastinal contours are within normal limits. Both lungs are clear. No pleural effusion or pneumothorax. The visualized skeletal structures are unremarkable. IMPRESSION: No active disease. Electronically Signed   By: Lajean Manes M.D.   On: 03/15/2019 15:35   Patient's COVID test is negative and chest x-ray is clear.  She can return to dialysis.  Return precautions discussed.  Patient expresses understanding and agreement with plan.  Discharged home in good condition.   Jacqlyn Larsen, PA-C 03/15/19 2312    Daleen Bo, MD 03/18/19 1044

## 2019-03-15 NOTE — ED Provider Notes (Signed)
[facility] EMERGENCY DEPARTMENT Provider Note   CSN: [id number] Arrival date & time: 03/15/19  1344     History   Chief Complaint Chief Complaint  Patient presents with  . Cough    HPI Wanda [name] is a 58 y.o. female.     The history is provided by the patient. No language interpreter was used.  Cough Cough characteristics:  Non-productive Sputum characteristics:  Nondescript Severity:  Mild Onset quality:  Gradual Duration:  10 days Timing:  Constant Progression:  Worsening Chronicity:  New Smoker: no   Relieved by:  Nothing Worsened by:  Nothing Ineffective treatments:  None tried   Past Medical History:  Diagnosis Date  . Anemia   . Arthritis    "hands" (07/24/2013) pt denies  . Chronic kidney disease, stage IV (severe) ([place])    "never went on dialysis" (07/24/2013)  . Diabetes mellitus type II   . Diabetic peripheral neuropathy ([place])   . Dialysis patient [facility])    Monday Wednesday Friday   . Dyspnea on exertion    pt denies  . Foot pain   . GERD (gastroesophageal reflux disease)   . Gout   . History of chicken pox   . History of measles   . History of mumps   . Hyperlipidemia   . Hypothyroidism   . Migraines    "q 3 months" (07/24/2013)  . Nonspecific abnormal unspecified cardiovascular function study   . Obesity   . PAD (peripheral artery disease) ([place])   . Peripheral neuropathy   . Peripheral vascular disease ([place])   . Pneumonia 09/2012  . Renal transplant disorder   . Shingles   . Sleep apnea    "getting ready to get tested again cause dr says I have it" (07/24/2013) pt states was tested and was told she did not have sleep apnea / no cpap  . Unspecified essential hypertension     Patient Active Problem List   Diagnosis Date Noted  . A-V fistula ([place]) 02/08/2018  . 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 Hypokalemia 10/07/2016  . Peritonitis ([place]) 10/07/2016  . SBP (spontaneous bacterial peritonitis)  ([place]) 10/07/2016  . Atrial fibrillation ([place]) 02/24/2016  . Depression 02/24/2016  . Stroke ([place]) 02/24/2016  . Morbid obesity ([place]) 05/26/2015  . Burn any degree involving less than 10 percent of body surface 04/21/2015  . Acute pain due to trauma 04/21/2015  . Neuropathic pain 04/21/2015  . Peripheral neuropathy 04/21/2015  . Hypoglycemia 07/24/2013  . Nausea and vomiting 01/08/2013  . Fever 01/08/2013  . Renal failure (ARF), acute on chronic (HCC) 01/08/2013  . Acute kidney failure ([place]) 01/08/2013  . Renal transplant disorder 09/04/2012  . Hyponatremia 09/04/2012  . CKD (chronic kidney disease) 09/04/2012  . HTN (hypertension) 09/04/2012  . Healthcare-associated pneumonia 09/04/2012  . Anemia 09/04/2012  . Urinary retention 07/16/2012  . History of kidney transplant 05/18/2012  . Acquired absence of other specified parts of digestive tract 05/17/2012  . Cough 03/22/2011  . SHINGLES 12/21/2009  . HYPOTHYROIDISM 12/21/2009  . DM (diabetes mellitus), type 2 with renal complications ([place]) 73/22/0254  . HYPERLIPIDEMIA 12/21/2009  . OBESITY 12/21/2009  . DYSPNEA 12/21/2009  . MIGRAINES, HX OF 12/21/2009  . HYPERTENSION, UNSPECIFIED 11/20/2009  . CHRONIC KIDNEY DISEASE STAGE IV (SEVERE) 11/20/2009  . DYSPNEA ON EXERTION 11/20/2009  . NONSPECIFIC ABNORMAL UNSPEC CV FUNCTION STUDY 11/20/2009    Past Surgical History:  Procedure Laterality Date  . AV FISTULA PLACEMENT Right 2000   "  lower arm" (07/24/2013)  . AV FISTULA PLACEMENT, RADIOCEPHALIC Right 5573   "upper arm" (07/24/2013)  . CAPD REMOVAL N/A 10/10/2016   Procedure: CONTINUOUS AMBULATORY PERITONEAL DIALYSIS  (CAPD) CATHETER REMOVAL;  Surgeon: Donnie Mesa, MD;  Location: [facility];  Service: General;  Laterality: N/A;  . CATARACT EXTRACTION W/ INTRAOCULAR LENS  IMPLANT, BILATERAL Bilateral 2011  . [place]; 1987  . CHOLECYSTECTOMY  2011  . COLONOSCOPY    . ENDOMETRIAL CRYOABLATION  1980's  . INTRAUTERINE  DEVICE (IUD) INSERTION  2011  . LAPAROSCOPIC GASTRIC SLEEVE RESECTION N/A 05/26/2015   Procedure: LAPAROSCOPIC GASTRIC SLEEVE RESECTION;  Surgeon: Excell Seltzer, MD;  Location: [facility];  Service: General;  Laterality: N/A;  . TRANSPLANTATION RENAL  2013   "right" (09/04/2012)  . TUBAL LIGATION  1987  . [place]   "RLE; stripped" (09/04/2012)  . VIDEO BRONCHOSCOPY  09/07/2012   Procedure: VIDEO BRONCHOSCOPY WITHOUT FLUORO;  Surgeon: Chesley Mires, MD;  Location: [facility] ENDOSCOPY;  Service: Cardiopulmonary;  Laterality: Bilateral;     OB History   No obstetric history on file.      Home Medications    Prior to Admission medications   Medication Sig Start Date End Date Taking? Authorizing Provider  allopurinol (ZYLOPRIM) 100 MG tablet Take 2 tablets (200 mg total) by mouth daily. 12/02/17   Ashley [place], NP  amLODipine (NORVASC) 5 MG tablet Take by mouth.    [provider]  calcitRIOL (ROCALTROL) 0.25 MCG capsule Take by mouth daily. 12/29/17   [provider]  clonazePAM (KLONOPIN) 1 MG tablet Take by mouth.    [provider]  colchicine (COLCRYS) 0.6 MG tablet Take 0.6 mg by mouth daily as needed (gout).  12/30/15   [provider]  famotidine (PEPCID) 20 MG tablet Take 20 mg by mouth daily as needed for heartburn. 06/30/18   [provider]  gabapentin (NEURONTIN) 100 MG capsule TAKE ONE CAPSULE BY MOUTH DAILY PLUS AN ADDITIONAL 1 CAPSULE POST-DIALYSIS 02/11/19   Jamse Arn, MD  hydrOXYzine (ATARAX/VISTARIL) 10 MG tablet Take 10 mg by mouth 2 (two) times daily as needed for itching. 04/19/18   [provider]  indomethacin (INDOCIN) 25 MG capsule Take 25 mg by mouth daily as needed for pain. 06/27/18   [provider]  insulin aspart (NOVOLOG FLEXPEN) 100 UNIT/ML FlexPen Inject 12 Units into the skin 3 (three) times daily with meals. And pen needles 3/day 02/22/18   Renato Shin, MD   L-Methylfolate-Algae-B12-B6 [facility]) 3-90.314-2-35 MG CAPS Take 1 tablet by mouth daily. 01/18/17   [provider]  midodrine (PROAMATINE) 10 MG tablet TAKE 1 TABLET 30 MINS PRIOR TO DIALYSIS ON MOND/WED/FRIDAY,MAY REPEAT MID TUES/THUR IF NEED 01/20/18   [provider]  nortriptyline (PAMELOR) 25 MG capsule Take 1 capsule (25 mg total) by mouth at bedtime. 07/12/18   Jamse Arn, MD  pantoprazole (PROTONIX) 40 MG tablet Take 40 mg by mouth daily. 01/16/18   [provider]  rOPINIRole (REQUIP) 0.25 MG tablet 0.25 mg daily as needed (neuropathy).  01/29/18   [provider]  sevelamer carbonate (RENVELA) 2.4 g PACK Take 2.4 g by mouth See admin instructions. Sprinkle 1 packet (2.4 g) with every meal and with snacks    [provider]  traZODone (DESYREL) 100 MG tablet Take 100 mg by mouth at bedtime as needed for sleep. 06/13/18   [provider]  Vitamin D, Ergocalciferol, (DRISDOL) 50000 units CAPS capsule  TAKE 1 CAPSULE (50,000 UNITS TOTAL) BY MOUTH EVERY 7 (SEVEN) DAYS. 03/29/18   Jamse Arn, MD    Family History Family History  Problem Relation Age of Onset  . Colon cancer Father        deceased at age 61  . Diabetes Brother   . Neuropathy Neg Hx     Social History Social History   Tobacco Use  . Smoking status: Never Smoker  . Smokeless tobacco: Never Used  Substance Use Topics  . Alcohol use: No  . Drug use: No     Allergies   Hydrocodone-acetaminophen, Propoxyphene n-acetaminophen, and Cephalosporins   Review of Systems Review of Systems  Respiratory: Positive for cough.      Physical Exam Updated Vital Signs BP 122/75   Pulse 87   Temp 98.3 F (36.8 C) (Oral)   Resp 18   Wt 97.1 kg   SpO2 99%   BMI 31.61 kg/m   Physical Exam   ED Treatments / Results  Labs (all labs ordered are listed, but only abnormal results are displayed) Labs Reviewed  NOVEL CORONAVIRUS, NAA (HOSPITAL ORDER,  SEND-OUT TO REF LAB)    EKG    Radiology No results found.  Procedures Procedures (including critical care time)  Medications Ordered in ED Medications - No data to display   Initial Impression / Assessment and Plan / ED Course  I have reviewed the triage vital signs and the nursing notes.  Pertinent labs & imaging results that were available during my care of the patient were reviewed by me and considered in my medical decision making (see chart for details).          Final Clinical Impressions(s) / ED Diagnoses   Final diagnoses:  Cough  Upper respiratory tract infection, unspecified type    ED Discharge Orders    None    Pt's care turned over at 4p    Fransico Meadow, Hershal Coria 03/16/19 1201    Daleen Bo, MD 03/18/19 1042

## 2019-03-15 NOTE — Discharge Instructions (Addendum)
Your COVID-19 test is negative and chest x-ray is clear.  You can return to dialysis as normal.  Continue to treat cough with over-the-counter medications as needed.  Return for worsening symptoms.

## 2019-03-15 NOTE — ED Triage Notes (Signed)
Pt in, sent by kidney center for rapid Covid testing. Center states they need negative covid swabs to be able to dialyze by tonight. Informed we do not perform rapids unless pt getting admitted. Per center, pt's just need kidney labs and covid test if we will perform. Husband works at [employer], has had exposure to positive Covid's - is c/o cough

## 2019-03-15 NOTE — ED Provider Notes (Signed)
[facility] EMERGENCY DEPARTMENT Provider Note   CSN: [id number] Arrival date & time: 03/15/19  1344     History   Chief Complaint Chief Complaint  Patient presents with  . Cough    HPI Wanda [name] is a 58 y.o. female.     The history is provided by the patient. No language interpreter was used.  Cough Cough characteristics:  Non-productive Sputum characteristics:  Nondescript Severity:  Mild Onset quality:  Gradual Duration:  1 week Timing:  Constant Smoker: no   Relieved by:  Nothing Worsened by:  Nothing Ineffective treatments:  None tried Associated symptoms: no shortness of breath and no sore throat    Pt's husband has been exposed to covid.  Pt's husband is also here Past Medical History:  Diagnosis Date  . Anemia   . Arthritis    "hands" (07/24/2013) pt denies  . Chronic kidney disease, stage IV (severe) ([place])    "never went on dialysis" (07/24/2013)  . Diabetes mellitus type II   . Diabetic peripheral neuropathy ([place])   . Dialysis patient [facility])    Monday Wednesday Friday   . Dyspnea on exertion    pt denies  . Foot pain   . GERD (gastroesophageal reflux disease)   . Gout   . History of chicken pox   . History of measles   . History of mumps   . Hyperlipidemia   . Hypothyroidism   . Migraines    "q 3 months" (07/24/2013)  . Nonspecific abnormal unspecified cardiovascular function study   . Obesity   . PAD (peripheral artery disease) ([place])   . Peripheral neuropathy   . Peripheral vascular disease ([place])   . Pneumonia 09/2012  . Renal transplant disorder   . Shingles   . Sleep apnea    "getting ready to get tested again cause dr says I have it" (07/24/2013) pt states was tested and was told she did not have sleep apnea / no cpap  . Unspecified essential hypertension     Patient Active Problem List   Diagnosis Date Noted  . A-V fistula ([place]) 02/08/2018  . 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 Hypokalemia  10/07/2016  . Peritonitis ([place]) 10/07/2016  . SBP (spontaneous bacterial peritonitis) ([place]) 10/07/2016  . Atrial fibrillation ([place]) 02/24/2016  . Depression 02/24/2016  . Stroke ([place]) 02/24/2016  . Morbid obesity ([place]) 05/26/2015  . Burn any degree involving less than 10 percent of body surface 04/21/2015  . Acute pain due to trauma 04/21/2015  . Neuropathic pain 04/21/2015  . Peripheral neuropathy 04/21/2015  . Hypoglycemia 07/24/2013  . Nausea and vomiting 01/08/2013  . Fever 01/08/2013  . Renal failure (ARF), acute on chronic (HCC) 01/08/2013  . Acute kidney failure ([place]) 01/08/2013  . Renal transplant disorder 09/04/2012  . Hyponatremia 09/04/2012  . CKD (chronic kidney disease) 09/04/2012  . HTN (hypertension) 09/04/2012  . Healthcare-associated pneumonia 09/04/2012  . Anemia 09/04/2012  . Urinary retention 07/16/2012  . History of kidney transplant 05/18/2012  . Acquired absence of other specified parts of digestive tract 05/17/2012  . Cough 03/22/2011  . SHINGLES 12/21/2009  . HYPOTHYROIDISM 12/21/2009  . DM (diabetes mellitus), type 2 with renal complications ([place]) 09/62/8366  . HYPERLIPIDEMIA 12/21/2009  . OBESITY 12/21/2009  . DYSPNEA 12/21/2009  . MIGRAINES, HX OF 12/21/2009  . HYPERTENSION, UNSPECIFIED 11/20/2009  . CHRONIC KIDNEY DISEASE STAGE IV (SEVERE) 11/20/2009  . DYSPNEA ON EXERTION 11/20/2009  . NONSPECIFIC ABNORMAL UNSPEC CV FUNCTION STUDY 11/20/2009  Past Surgical History:  Procedure Laterality Date  . AV FISTULA PLACEMENT Right 2000   "lower arm" (07/24/2013)  . AV FISTULA PLACEMENT, RADIOCEPHALIC Right 4481   "upper arm" (07/24/2013)  . CAPD REMOVAL N/A 10/10/2016   Procedure: CONTINUOUS AMBULATORY PERITONEAL DIALYSIS  (CAPD) CATHETER REMOVAL;  Surgeon: Donnie Mesa, MD;  Location: [facility];  Service: General;  Laterality: N/A;  . CATARACT EXTRACTION W/ INTRAOCULAR LENS  IMPLANT, BILATERAL Bilateral 2011  . [place]; 1987  .  CHOLECYSTECTOMY  2011  . COLONOSCOPY    . ENDOMETRIAL CRYOABLATION  1980's  . INTRAUTERINE DEVICE (IUD) INSERTION  2011  . LAPAROSCOPIC GASTRIC SLEEVE RESECTION N/A 05/26/2015   Procedure: LAPAROSCOPIC GASTRIC SLEEVE RESECTION;  Surgeon: Excell Seltzer, MD;  Location[facility];  Service: General;  Laterality: N/A;  . TRANSPLANTATION RENAL  2013   "right" (09/04/2012)  . TUBAL LIGATION  1987  . [place]   "RLE; stripped" (09/04/2012)  . VIDEO BRONCHOSCOPY  09/07/2012   Procedure: VIDEO BRONCHOSCOPY WITHOUT FLUORO;  Surgeon: Chesley Mires, MD;  Location: [facility] ENDOSCOPY;  Service: Cardiopulmonary;  Laterality: Bilateral;     OB History   No obstetric history on file.      Home Medications    Prior to Admission medications   Medication Sig Start Date End Date Taking? Authorizing Provider  allopurinol (ZYLOPRIM) 100 MG tablet Take 2 tablets (200 mg total) by mouth daily. 12/02/17   Ashley Murrain, NP  amLODipine (NORVASC) 5 MG tablet Take by mouth.    [provider]  calcitRIOL (ROCALTROL) 0.25 MCG capsule Take by mouth daily. 12/29/17   [provider]  clonazePAM (KLONOPIN) 1 MG tablet Take by mouth.    [provider]  colchicine (COLCRYS) 0.6 MG tablet Take 0.6 mg by mouth daily as needed (gout).  12/30/15   [provider]  famotidine (PEPCID) 20 MG tablet Take 20 mg by mouth daily as needed for heartburn. 06/30/18   [provider]  gabapentin (NEURONTIN) 100 MG capsule TAKE ONE CAPSULE BY MOUTH DAILY PLUS AN ADDITIONAL 1 CAPSULE POST-DIALYSIS 02/11/19   Jamse Arn, MD  hydrOXYzine (ATARAX/VISTARIL) 10 MG tablet Take 10 mg by mouth 2 (two) times daily as needed for itching. 04/19/18   [provider]  indomethacin (INDOCIN) 25 MG capsule Take 25 mg by mouth daily as needed for pain. 06/27/18   [provider]  insulin aspart (NOVOLOG FLEXPEN) 100 UNIT/ML FlexPen Inject 12 Units into the skin 3 (three) times  daily with meals. And pen needles 3/day 02/22/18   Renato Shin, MD  L-Methylfolate-Algae-B12-B6 [facility]) 3-90.314-2-35 MG CAPS Take 1 tablet by mouth daily. 01/18/17   [provider]  midodrine (PROAMATINE) 10 MG tablet TAKE 1 TABLET 30 MINS PRIOR TO DIALYSIS ON MOND/WED/FRIDAY,MAY REPEAT MID TUES/THUR IF NEED 01/20/18   [provider]  nortriptyline (PAMELOR) 25 MG capsule Take 1 capsule (25 mg total) by mouth at bedtime. 07/12/18   Jamse Arn, MD  pantoprazole (PROTONIX) 40 MG tablet Take 40 mg by mouth daily. 01/16/18   [provider]  rOPINIRole (REQUIP) 0.25 MG tablet 0.25 mg daily as needed (neuropathy).  01/29/18   [provider]  sevelamer carbonate (RENVELA) 2.4 g PACK Take 2.4 g by mouth See admin instructions. Sprinkle 1 packet (2.4 g) with every meal and with snacks    [provider]  traZODone (DESYREL) 100 MG tablet Take 100 mg by mouth at bedtime as needed for  sleep. 06/13/18   [provider]  Vitamin D, Ergocalciferol, (DRISDOL) 50000 units CAPS capsule TAKE 1 CAPSULE (50,000 UNITS TOTAL) BY MOUTH EVERY 7 (SEVEN) DAYS. 03/29/18   Jamse Arn, MD    Family History Family History  Problem Relation Age of Onset  . Colon cancer Father        deceased at age 59  . Diabetes Brother   . Neuropathy Neg Hx     Social History Social History   Tobacco Use  . Smoking status: Never Smoker  . Smokeless tobacco: Never Used  Substance Use Topics  . Alcohol use: No  . Drug use: No     Allergies   Hydrocodone-acetaminophen, Propoxyphene n-acetaminophen, and Cephalosporins   Review of Systems Review of Systems  HENT: Negative for sore throat.   Respiratory: Positive for cough. Negative for shortness of breath.   All other systems reviewed and are negative.    Physical Exam Updated Vital Signs BP 122/75   Pulse 87   Temp 98.3 F (36.8 C) (Oral)   Resp 18   Wt 97.1 kg   SpO2 99%   BMI 31.61 kg/m    Physical Exam Vitals signs and nursing note reviewed.  Constitutional:      Appearance: She is well-developed.  HENT:     Head: Normocephalic.     Mouth/Throat:     Mouth: Mucous membranes are moist.  Eyes:     Pupils: Pupils are equal, round, and reactive to light.  Neck:     Musculoskeletal: Normal range of motion.  Cardiovascular:     Rate and Rhythm: Normal rate.  Pulmonary:     Effort: Pulmonary effort is normal.  Abdominal:     General: There is no distension.  Musculoskeletal: Normal range of motion.  Skin:    General: Skin is warm.  Neurological:     Mental Status: She is alert and oriented to person, place, and time.  Psychiatric:        Mood and Affect: Mood normal.      ED Treatments / Results  Labs (all labs ordered are listed, but only abnormal results are displayed) Labs Reviewed  SARS CORONAVIRUS 2 (HOSPITAL ORDER, [place] [facility])    EKG    Radiology No results found.  Procedures Procedures (including critical care time)  Medications Ordered in ED Medications - No data to display   Initial Impression / Assessment and Plan / ED Course  I have reviewed the triage vital signs and the nursing notes.  Pertinent labs & imaging results that were available during my care of the patient were reviewed by me and considered in my medical decision making (see chart for details).        I spoke with Dr. Jonnie Finner.  2 hour covid ordered so that pt can have dialysis   Final Clinical Impressions(s) / ED Diagnoses   Final diagnoses:  Cough  Upper respiratory tract infection, unspecified type    ED Discharge Orders    None       Fransico Meadow, [place] 03/16/19 1200    Daleen Bo, MD 03/18/19 1044

## 2019-03-30 ENCOUNTER — Other Ambulatory Visit: Payer: Self-pay | Admitting: Physical Medicine & Rehabilitation

## 2019-04-18 ENCOUNTER — Ambulatory Visit: Payer: Medicare HMO | Admitting: Endocrinology

## 2019-04-18 DIAGNOSIS — Z0289 Encounter for other administrative examinations: Secondary | ICD-10-CM

## 2019-04-23 ENCOUNTER — Ambulatory Visit: Payer: Medicare HMO | Admitting: Internal Medicine

## 2019-04-29 ENCOUNTER — Ambulatory Visit: Payer: Medicare HMO | Admitting: Physical Medicine & Rehabilitation

## 2019-05-02 ENCOUNTER — Encounter: Payer: Medicare HMO | Admitting: Physical Medicine & Rehabilitation

## 2019-05-09 ENCOUNTER — Encounter: Payer: Medicare HMO | Admitting: Physical Medicine & Rehabilitation

## 2019-05-09 ENCOUNTER — Telehealth: Payer: Self-pay | Admitting: *Deleted

## 2019-05-09 NOTE — Telephone Encounter (Signed)
Formal warning for no show and cancellations mailed to Ms Wanda [name] and sent through [place].

## 2019-12-15 ENCOUNTER — Encounter (HOSPITAL_COMMUNITY): Payer: Self-pay

## 2019-12-15 ENCOUNTER — Emergency Department (HOSPITAL_COMMUNITY): Payer: Medicare HMO

## 2019-12-15 ENCOUNTER — Emergency Department (HOSPITAL_COMMUNITY)
Admission: EM | Admit: 2019-12-15 | Discharge: 2019-12-16 | Disposition: A | Discharge status: Home / Self Care | Payer: Medicare HMO | Attending: Emergency Medicine | Admitting: Emergency Medicine

## 2019-12-15 ENCOUNTER — Other Ambulatory Visit: Payer: Self-pay

## 2019-12-15 DIAGNOSIS — R55 Syncope and collapse: Secondary | ICD-10-CM | POA: Diagnosis not present

## 2019-12-15 DIAGNOSIS — N184 Chronic kidney disease, stage 4 (severe): Secondary | ICD-10-CM | POA: Diagnosis not present

## 2019-12-15 DIAGNOSIS — Z940 Kidney transplant status: Secondary | ICD-10-CM | POA: Diagnosis not present

## 2019-12-15 DIAGNOSIS — Z7901 Long term (current) use of anticoagulants: Secondary | ICD-10-CM | POA: Diagnosis not present

## 2019-12-15 DIAGNOSIS — E039 Hypothyroidism, unspecified: Secondary | ICD-10-CM | POA: Insufficient documentation

## 2019-12-15 DIAGNOSIS — E1122 Type 2 diabetes mellitus with diabetic chronic kidney disease: Secondary | ICD-10-CM | POA: Diagnosis not present

## 2019-12-15 DIAGNOSIS — Z9049 Acquired absence of other specified parts of digestive tract: Secondary | ICD-10-CM | POA: Diagnosis not present

## 2019-12-15 DIAGNOSIS — Z8673 Personal history of transient ischemic attack (TIA), and cerebral infarction without residual deficits: Secondary | ICD-10-CM | POA: Diagnosis not present

## 2019-12-15 DIAGNOSIS — Z79899 Other long term (current) drug therapy: Secondary | ICD-10-CM | POA: Insufficient documentation

## 2019-12-15 DIAGNOSIS — I129 Hypertensive chronic kidney disease with stage 1 through stage 4 chronic kidney disease, or unspecified chronic kidney disease: Secondary | ICD-10-CM | POA: Insufficient documentation

## 2019-12-15 DIAGNOSIS — Z9884 Bariatric surgery status: Secondary | ICD-10-CM | POA: Diagnosis not present

## 2019-12-15 DIAGNOSIS — Z20822 Contact with and (suspected) exposure to covid-19: Secondary | ICD-10-CM | POA: Diagnosis not present

## 2019-12-15 DIAGNOSIS — Z794 Long term (current) use of insulin: Secondary | ICD-10-CM | POA: Insufficient documentation

## 2019-12-15 LAB — CBC WITH DIFFERENTIAL/PLATELET
Abs Immature Granulocytes: 0.02 10*3/uL (ref 0.00–0.07)
Basophils Absolute: 0 10*3/uL (ref 0.0–0.1)
Basophils Relative: 1 %
Eosinophils Absolute: 0 10*3/uL (ref 0.0–0.5)
Eosinophils Relative: 1 %
HCT: 26.3 % — ABNORMAL LOW (ref 36.0–46.0)
Hemoglobin: 7.8 g/dL — ABNORMAL LOW (ref 12.0–15.0)
Immature Granulocytes: 1 %
Lymphocytes Relative: 28 %
Lymphs Abs: 1.1 10*3/uL (ref 0.7–4.0)
MCH: 31 pg (ref 26.0–34.0)
MCHC: 29.7 g/dL — ABNORMAL LOW (ref 30.0–36.0)
MCV: 104.4 fL — ABNORMAL HIGH (ref 80.0–100.0)
Monocytes Absolute: 0.1 10*3/uL (ref 0.1–1.0)
Monocytes Relative: 2 %
Neutro Abs: 2.8 10*3/uL (ref 1.7–7.7)
Neutrophils Relative %: 67 %
Platelets: DECREASED 10*3/uL (ref 150–400)
RBC: 2.52 MIL/uL — ABNORMAL LOW (ref 3.87–5.11)
RDW: 22.3 % — ABNORMAL HIGH (ref 11.5–15.5)
WBC: 4.1 10*3/uL (ref 4.0–10.5)
nRBC: 0 % (ref 0.0–0.2)

## 2019-12-15 LAB — COMPREHENSIVE METABOLIC PANEL
ALT: 31 U/L (ref 0–44)
AST: 39 U/L (ref 15–41)
Albumin: 1.4 g/dL — ABNORMAL LOW (ref 3.5–5.0)
Alkaline Phosphatase: 495 U/L — ABNORMAL HIGH (ref 38–126)
Anion gap: 8 (ref 5–15)
BUN: 16 mg/dL (ref 6–20)
CO2: 18 mmol/L — ABNORMAL LOW (ref 22–32)
Calcium: 5 mg/dL — CL (ref 8.9–10.3)
Chloride: 120 mmol/L — ABNORMAL HIGH (ref 98–111)
Creatinine, Ser: 1.59 mg/dL — ABNORMAL HIGH (ref 0.44–1.00)
GFR calc Af Amer: 41 mL/min — ABNORMAL LOW (ref >60)
GFR calc non Af Amer: 35 mL/min — ABNORMAL LOW (ref >60)
Glucose, Bld: 62 mg/dL — ABNORMAL LOW (ref 70–99)
Potassium: 2.4 mmol/L — CL (ref 3.5–5.1)
Sodium: 146 mmol/L — ABNORMAL HIGH (ref 135–145)
Total Bilirubin: 0.7 mg/dL (ref 0.3–1.2)
Total Protein: 3.4 g/dL — ABNORMAL LOW (ref 6.5–8.1)

## 2019-12-15 LAB — URINALYSIS, ROUTINE W REFLEX MICROSCOPIC
Bilirubin Urine: NEGATIVE
Glucose, UA: NEGATIVE mg/dL
Ketones, ur: NEGATIVE mg/dL
Nitrite: NEGATIVE
Protein, ur: NEGATIVE mg/dL
Specific Gravity, Urine: 1.014 (ref 1.005–1.030)
pH: 6 (ref 5.0–8.0)

## 2019-12-15 LAB — I-STAT CHEM 8, ED
BUN: 28 mg/dL — ABNORMAL HIGH (ref 6–20)
Calcium, Ion: 0.83 mmol/L — CL (ref 1.15–1.40)
Chloride: 108 mmol/L (ref 98–111)
Creatinine, Ser: 2.2 mg/dL — ABNORMAL HIGH (ref 0.44–1.00)
Glucose, Bld: 65 mg/dL — ABNORMAL LOW (ref 70–99)
HCT: 28 % — ABNORMAL LOW (ref 36.0–46.0)
Hemoglobin: 9.5 g/dL — ABNORMAL LOW (ref 12.0–15.0)
Potassium: 3.7 mmol/L (ref 3.5–5.1)
Sodium: 141 mmol/L (ref 135–145)
TCO2: 26 mmol/L (ref 22–32)

## 2019-12-15 LAB — CBG MONITORING, ED: Glucose-Capillary: 71 mg/dL (ref 70–99)

## 2019-12-15 MED ORDER — SODIUM CHLORIDE 0.9 % IV SOLN: Freq: Once | INTRAVENOUS | Status: AC

## 2019-12-15 MED ORDER — SODIUM CHLORIDE 0.9 % IV BOLUS: 500.0000 mL | Freq: Once | INTRAVENOUS | Status: DC

## 2019-12-15 MED ORDER — SODIUM CHLORIDE 0.9 % IV BOLUS
1000.0000 mL | Freq: Once | INTRAVENOUS | Status: AC
  Administered 2019-12-15: 1000 mL via INTRAVENOUS

## 2019-12-15 NOTE — ED Provider Notes (Addendum)
[facility] EMERGENCY DEPARTMENT Provider Note   CSN: [id number] Arrival date & time: 12/15/19  1541     History Chief Complaint  Patient presents with  . Loss of Consciousness    Wanda [name] is a 59 y.o. female.  Level 5 caveat for acuity of condition.  Patient allegedly had a syncopal spell today at home while sitting in the chair.  She is status post kidney transplant on June 23, 2019 in [place].  She has recently been at [facility] in [place] for rehab.  Released on Thursday.  Decreased food intake since then.  She states a "urinary tract infection", but unknown antibiotic.  No fever, sweats, chills.        Past Medical History:  Diagnosis Date  . Anemia   . Arthritis    "hands" (07/24/2013) pt denies  . Chronic kidney disease, stage IV (severe) ([place])    "never went on dialysis" (07/24/2013)  . Diabetes mellitus type II   . Diabetic peripheral neuropathy ([place])   . Dialysis patient [facility])    Monday Wednesday Friday   . Dyspnea on exertion    pt denies  . Foot pain   . GERD (gastroesophageal reflux disease)   . Gout   . History of chicken pox   . History of measles   . History of mumps   . Hyperlipidemia   . Hypothyroidism   . Migraines    "q 3 months" (07/24/2013)  . Nonspecific abnormal unspecified cardiovascular function study   . Obesity   . PAD (peripheral artery disease) ([place])   . Peripheral neuropathy   . Peripheral vascular disease ([place])   . Pneumonia 09/2012  . Renal transplant disorder   . Shingles   . Sleep apnea    "getting ready to get tested again cause dr says I have it" (07/24/2013) pt states was tested and was told she did not have sleep apnea / no cpap  . Unspecified essential hypertension     Patient Active Problem List   Diagnosis Date Noted  . A-V fistula ([place]) 02/08/2018  . 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 Hypokalemia 10/07/2016  . Peritonitis ([place]) 10/07/2016  . SBP (spontaneous  bacterial peritonitis) ([place]) 10/07/2016  . Atrial fibrillation ([place]) 02/24/2016  . Depression 02/24/2016  . Stroke ([place]) 02/24/2016  . Morbid obesity ([place]) 05/26/2015  . Burn any degree involving less than 10 percent of body surface 04/21/2015  . Acute pain due to trauma 04/21/2015  . Neuropathic pain 04/21/2015  . Peripheral neuropathy 04/21/2015  . Hypoglycemia 07/24/2013  . Nausea and vomiting 01/08/2013  . Fever 01/08/2013  . Renal failure (ARF), acute on chronic (HCC) 01/08/2013  . Acute kidney failure ([place]) 01/08/2013  . Renal transplant disorder 09/04/2012  . Hyponatremia 09/04/2012  . CKD (chronic kidney disease) 09/04/2012  . HTN (hypertension) 09/04/2012  . Healthcare-associated pneumonia 09/04/2012  . Anemia 09/04/2012  . Urinary retention 07/16/2012  . History of kidney transplant 05/18/2012  . Acquired absence of other specified parts of digestive tract 05/17/2012  . Cough 03/22/2011  . SHINGLES 12/21/2009  . HYPOTHYROIDISM 12/21/2009  . DM (diabetes mellitus), type 2 with renal complications ([place]) 69/67/8938  . HYPERLIPIDEMIA 12/21/2009  . OBESITY 12/21/2009  . DYSPNEA 12/21/2009  . MIGRAINES, HX OF 12/21/2009  . HYPERTENSION, UNSPECIFIED 11/20/2009  . CHRONIC KIDNEY DISEASE STAGE IV (SEVERE) 11/20/2009  . DYSPNEA ON EXERTION 11/20/2009  . NONSPECIFIC ABNORMAL UNSPEC CV FUNCTION STUDY 11/20/2009    Past Surgical History:  Procedure Laterality Date  . AV FISTULA PLACEMENT Right 2000   "lower arm" (07/24/2013)  . AV FISTULA PLACEMENT, RADIOCEPHALIC Right 7353   "upper arm" (07/24/2013)  . CAPD REMOVAL N/A 10/10/2016   Procedure: CONTINUOUS AMBULATORY PERITONEAL DIALYSIS  (CAPD) CATHETER REMOVAL;  Surgeon: Donnie Mesa, MD;  Location: [place];  Service: General;  Laterality: N/A;  . CATARACT EXTRACTION W/ INTRAOCULAR LENS  IMPLANT, BILATERAL Bilateral 2011  . [place]; 1987  . CHOLECYSTECTOMY  2011  . COLONOSCOPY    . ENDOMETRIAL CRYOABLATION   1980's  . INTRAUTERINE DEVICE (IUD) INSERTION  2011  . LAPAROSCOPIC GASTRIC SLEEVE RESECTION N/A 05/26/2015   Procedure: LAPAROSCOPIC GASTRIC SLEEVE RESECTION;  Surgeon: Excell Seltzer, MD;  [facility];  Service: General;  Laterality: N/A;  . TRANSPLANTATION RENAL  2013   "right" (09/04/2012)  . TUBAL LIGATION  1987  . [place]   "RLE; stripped" (09/04/2012)  . VIDEO BRONCHOSCOPY  09/07/2012   Procedure: VIDEO BRONCHOSCOPY WITHOUT FLUORO;  Surgeon: Chesley Mires, MD;  Location: [facility] ENDOSCOPY;  Service: Cardiopulmonary;  Laterality: Bilateral;     OB History   No obstetric history on file.     Family History  Problem Relation Age of Onset  . Colon cancer Father        deceased at age 71  . Diabetes Brother   . Neuropathy Neg Hx     Social History   Tobacco Use  . Smoking status: Never Smoker  . Smokeless tobacco: Never Used  Substance Use Topics  . Alcohol use: No  . Drug use: No    Home Medications Prior to Admission medications   Medication Sig Start Date End Date Taking? Authorizing Provider  amLODipine (NORVASC) 5 MG tablet Take 10 mg by mouth daily.    Yes [provider]  apixaban (ELIQUIS) 5 MG TABS tablet Take 5 mg by mouth every 12 (twelve) hours.   Yes [provider]  atorvastatin (LIPITOR) 20 MG tablet Take 20 mg by mouth at bedtime.   Yes [provider]  Cholecalciferol (VITAMIN D-3) 25 MCG (1000 UT) CAPS Take 1,000 Units by mouth daily.   Yes [provider]  metoprolol tartrate (LOPRESSOR) 50 MG tablet Take 50 mg by mouth 2 (two) times daily.   Yes [provider]  mycophenolate (CELLCEPT) 500 MG tablet Take 1,000 mg by mouth every 12 (twelve) hours.  07/17/19  Yes [provider]  nortriptyline (PAMELOR) 25 MG capsule TAKE 1 CAPSULE (25 MG TOTAL) BY MOUTH AT BEDTIME. 04/01/19  Yes Patel, Domenick Bookbinder, MD  pantoprazole (PROTONIX) 40 MG tablet Take 40 mg by mouth daily. 01/16/18  Yes  [provider]  predniSONE (DELTASONE) 5 MG tablet Take 5 mg by mouth daily with breakfast.   Yes [provider]  pregabalin (LYRICA) 50 MG capsule Take 50 mg by mouth at bedtime.   Yes [provider]  sodium bicarbonate 650 MG tablet Take 1,300 mg by mouth 3 (three) times daily.   Yes [provider]  sulfamethoxazole-trimethoprim (BACTRIM) 400-80 MG tablet Take 1 tablet by mouth daily.   Yes [provider]  tacrolimus (PROGRAF) 1 MG capsule Take 4 mg by mouth in the morning and at bedtime. 07/17/19  Yes [provider]  allopurinol (ZYLOPRIM) 100 MG tablet Take 2 tablets (200 mg total) by mouth daily. 12/02/17   Ashley Murrain, NP  calcitRIOL (ROCALTROL) 0.25 MCG capsule Take by mouth daily. 12/29/17  [provider]  clonazePAM (KLONOPIN) 1 MG tablet Take by mouth.    [provider]  colchicine (COLCRYS) 0.6 MG tablet Take 0.6 mg by mouth daily as needed (gout).  12/30/15   [provider]  famotidine (PEPCID) 20 MG tablet Take 20 mg by mouth daily as needed for heartburn. 06/30/18   [provider]  gabapentin (NEURONTIN) 100 MG capsule TAKE ONE CAPSULE BY MOUTH DAILY PLUS AN ADDITIONAL 1 CAPSULE POST-DIALYSIS 02/11/19   Jamse Arn, MD  hydrOXYzine (ATARAX/VISTARIL) 10 MG tablet Take 10 mg by mouth 2 (two) times daily as needed for itching. 04/19/18   [provider]  indomethacin (INDOCIN) 25 MG capsule Take 25 mg by mouth daily as needed for pain. 06/27/18   [provider]  insulin aspart (NOVOLOG FLEXPEN) 100 UNIT/ML FlexPen Inject 12 Units into the skin 3 (three) times daily with meals. And pen needles 3/day 02/22/18   Renato Shin, MD  L-Methylfolate-Algae-B12-B6 [facility]) 3-90.314-2-35 MG CAPS Take 1 tablet by mouth daily. 01/18/17   [provider]  midodrine (PROAMATINE) 10 MG tablet TAKE 1 TABLET 30 MINS PRIOR TO DIALYSIS ON MOND/WED/FRIDAY,MAY REPEAT MID TUES/THUR IF  NEED 01/20/18   [provider]  rOPINIRole (REQUIP) 0.25 MG tablet 0.25 mg daily as needed (neuropathy).  01/29/18   [provider]  sevelamer carbonate (RENVELA) 2.4 g PACK Take 2.4 g by mouth See admin instructions. Sprinkle 1 packet (2.4 g) with every meal and with snacks    [provider]  traZODone (DESYREL) 100 MG tablet Take 100 mg by mouth at bedtime as needed for sleep. 06/13/18   [provider]  Vitamin D, Ergocalciferol, (DRISDOL) 50000 units CAPS capsule TAKE 1 CAPSULE (50,000 UNITS TOTAL) BY MOUTH EVERY 7 (SEVEN) DAYS. 03/29/18   Jamse Arn, MD    Allergies    Hydrocodone-acetaminophen, Hydrocodone-acetaminophen, Propoxyphene n-acetaminophen, Astemizole, and Cephalosporins  Review of Systems   Review of Systems  All other systems reviewed and are negative.   Physical Exam Updated Vital Signs BP 129/72   Pulse 79   Temp 97.9 F (36.6 C) (Oral)   Resp 19   Ht 5\' 9"  (1.753 m)   Wt 73.9 kg   SpO2 100%   BMI 24.07 kg/m   Physical Exam Vitals and nursing note reviewed.  Constitutional:      Appearance: She is well-developed.     Comments: nad  HENT:     Head: Normocephalic and atraumatic.  Eyes:     Conjunctiva/sclera: Conjunctivae normal.  Cardiovascular:     Rate and Rhythm: Normal rate and regular rhythm.  Pulmonary:     Effort: Pulmonary effort is normal.     Breath sounds: Normal breath sounds.  Abdominal:     General: Bowel sounds are normal.     Palpations: Abdomen is soft.  Musculoskeletal:        General: Normal range of motion.     Cervical back: Neck supple.  Skin:    General: Skin is warm and dry.  Neurological:     General: No focal deficit present.     Mental Status: She is alert and oriented to person, place, and time.  Psychiatric:        Behavior: Behavior normal.     ED Results / Procedures / Treatments   Labs (all labs ordered are listed, but only abnormal results are displayed) Labs  Reviewed  SARS CORONAVIRUS 2 (TAT 6-24 HRS)  CBC WITH DIFFERENTIAL/PLATELET  COMPREHENSIVE METABOLIC  PANEL  URINALYSIS, ROUTINE W REFLEX MICROSCOPIC    EKG EKG Interpretation  Date/Time:  Sunday December 15 2019 15:51:09 EDT Ventricular Rate:  99 PR Interval:    QRS Duration: 105 QT Interval:  432 QTC Calculation: 555 R Axis:   46 Text Interpretation: Sinus rhythm Probable left atrial enlargement Low voltage, extremity leads Prolonged QT interval Confirmed by Nat Christen [phone]) on 12/15/2019 4:06:40 PM Also confirmed by Nat Christen [phone])  on 12/15/2019 8:27:16 PM   Radiology No results found.  Procedures Procedures (including critical care time)  Medications Ordered in ED Medications  0.9 %  sodium chloride infusion (has no administration in time range)  sodium chloride 0.9 % bolus 1,000 mL (0 mLs Intravenous Stopped 12/15/19 1943)    ED Course  I have reviewed the triage vital signs and the nursing notes.  Pertinent labs & imaging results that were available during my care of the patient were reviewed by me and considered in my medical decision making (see chart for details).    MDM Rules/Calculators/A&P                      Patient is hemodynamically stable.  BP and pulse normal.  EKG normal.  Will hydrate.  Labs pending.  Discussed with Dr. Maryan Rued.   Final Clinical Impression(s) / ED Diagnoses Final diagnoses:  Syncope, unspecified syncope type  Kidney transplanted    Rx / DC Orders ED Discharge Orders    None       Nat Christen, MD 12/15/19 1633    Nat Christen, MD 12/15/19 2029

## 2019-12-15 NOTE — ED Notes (Signed)
Notified EDP of line placed, but no blood samples yielded.

## 2019-12-15 NOTE — ED Notes (Signed)
Urine culture tube sent and urine cup sent to lab.

## 2019-12-15 NOTE — ED Notes (Signed)
Notified CN of difficulties with getting blood on this pt.

## 2019-12-15 NOTE — ED Triage Notes (Signed)
[facility] reports pt had syncopal episode in wheelchair with loc, no fall. Pt had kidney xplant in [place] in Sept with complications. She returned to [facility] on Feb 19 and went to [facility] and was released on Thursday from rehab. Pt has not been eating or drinking much since xplant. Cbg 125, 110/70, hx of afib 90-110.

## 2019-12-15 NOTE — ED Notes (Signed)
Unsuccessful  Blood draw attempt. RN Benjamine Mola aware

## 2019-12-15 NOTE — ED Notes (Signed)
Post bolus I attempted to draw back on IV with small syringe and I attempted a stick with a butterfly. Neither attempt yielded a sample. Phlebotomy has been asked to attempt a draw.

## 2019-12-16 LAB — CBG MONITORING, ED
Glucose-Capillary: 54 mg/dL — ABNORMAL LOW (ref 70–99)
Glucose-Capillary: 57 mg/dL — ABNORMAL LOW (ref 70–99)
Glucose-Capillary: 70 mg/dL (ref 70–99)
Glucose-Capillary: 84 mg/dL (ref 70–99)
Glucose-Capillary: 90 mg/dL (ref 70–99)

## 2019-12-16 LAB — SARS CORONAVIRUS 2 (TAT 6-24 HRS): SARS Coronavirus 2: NEGATIVE

## 2019-12-16 MED ORDER — ONDANSETRON HCL 4 MG/2ML IJ SOLN
4.0000 mg | Freq: Once | INTRAMUSCULAR | Status: AC
  Administered 2019-12-16: 4 mg via INTRAVENOUS
  Filled 2019-12-16: qty 2

## 2019-12-16 MED ORDER — CALCIUM GLUCONATE 500 MG PO TABS: 1.0000 | ORAL_TABLET | Freq: Three times a day (TID) | ORAL | 0 refills | Status: DC

## 2019-12-16 NOTE — ED Notes (Signed)
Dr Leonette Monarch informed of chem 8 results

## 2019-12-16 NOTE — ED Notes (Signed)
Pt tolerated 2 Arlan Organ and a little orange jucie. She is complaining of Nausea. Will continue to monitor CBG hour as ordered

## 2019-12-16 NOTE — ED Notes (Signed)
Pt states "I do not get up, I have PT/OT coming to the house to work with me. RN was able to get patient over to wheelchair by sliding. No complaints at his time.

## 2019-12-16 NOTE — ED Notes (Signed)
Pt's son would like a phone call when a dispo decision has been made. If pt d/c, son will pick her up. Contact info added to chart.

## 2019-12-16 NOTE — ED Provider Notes (Signed)
I assumed care of this patient from Dr. Theora Gianotti.  Please see their note for further details of Hx, PE.  Briefly patient is a 59 y.o. female who presented with syncope. Labs with electrolyte derangements that was felt to be superfluous.  Pending repeat labs  Repeat labs notable for hypoglycemia and hypocalcemia.  Patient provided with oral intake.  CBG remained stable for several hours.  Patient already has an appointment with her nephrologist tomorrow.  Will start patient on calcium.  . The patient appears reasonably screened and/or stabilized for discharge and I doubt any other medical condition or other [facility] requiring further screening, evaluation, or treatment in the ED at this time prior to discharge. Safe for discharge with strict return precautions.  Disposition: Discharge  Condition: Good  I have discussed the results, Dx and Tx plan with the patient/family who expressed understanding and agree(s) with the plan. Discharge instructions discussed at length. The patient/family was given strict return precautions who verbalized understanding of the instructions. No further questions at time of discharge.    ED Discharge Orders         Ordered    calcium gluconate 500 MG tablet  3 times daily     12/16/19 0654          Follow Up: Lorene Dy, MD [address] [place] [address] [phone]     [address], [place] [address] [phone]  Go to  as scheduled         Fatima Blank, MD 12/16/19 [phone]

## 2019-12-16 NOTE — ED Notes (Signed)
Patient verbalizes understanding of discharge instructions. Opportunity for questioning and answers were provided. Armband removed by staff, pt discharged from ED. Pt. ambulatory and discharged home.  

## 2019-12-28 ENCOUNTER — Other Ambulatory Visit: Payer: Self-pay | Admitting: Physical Medicine & Rehabilitation

## 2019-12-30 ENCOUNTER — Other Ambulatory Visit: Payer: Self-pay | Admitting: Physical Medicine & Rehabilitation

## 2020-01-21 ENCOUNTER — Other Ambulatory Visit: Payer: Self-pay | Admitting: Physical Medicine & Rehabilitation

## 2020-02-04 ENCOUNTER — Encounter (HOSPITAL_COMMUNITY): Payer: Self-pay | Admitting: Emergency Medicine

## 2020-02-04 ENCOUNTER — Inpatient Hospital Stay (HOSPITAL_COMMUNITY)
Admission: EM | Admit: 2020-02-04 | Discharge: 2020-02-11 | DRG: 871 | Disposition: A | Discharge status: Home Health | Payer: Medicare HMO | Attending: Internal Medicine | Admitting: Internal Medicine

## 2020-02-04 ENCOUNTER — Emergency Department (HOSPITAL_COMMUNITY): Payer: Medicare HMO

## 2020-02-04 ENCOUNTER — Other Ambulatory Visit: Payer: Self-pay

## 2020-02-04 DIAGNOSIS — Z993 Dependence on wheelchair: Secondary | ICD-10-CM

## 2020-02-04 DIAGNOSIS — D849 Immunodeficiency, unspecified: Secondary | ICD-10-CM | POA: Diagnosis not present

## 2020-02-04 DIAGNOSIS — I959 Hypotension, unspecified: Secondary | ICD-10-CM

## 2020-02-04 DIAGNOSIS — K5900 Constipation, unspecified: Secondary | ICD-10-CM | POA: Diagnosis present

## 2020-02-04 DIAGNOSIS — I480 Paroxysmal atrial fibrillation: Secondary | ICD-10-CM | POA: Diagnosis present

## 2020-02-04 DIAGNOSIS — R600 Localized edema: Secondary | ICD-10-CM | POA: Diagnosis present

## 2020-02-04 DIAGNOSIS — Z6826 Body mass index (BMI) 26.0-26.9, adult: Secondary | ICD-10-CM

## 2020-02-04 DIAGNOSIS — Z794 Long term (current) use of insulin: Secondary | ICD-10-CM

## 2020-02-04 DIAGNOSIS — D631 Anemia in chronic kidney disease: Secondary | ICD-10-CM | POA: Diagnosis present

## 2020-02-04 DIAGNOSIS — N179 Acute kidney failure, unspecified: Secondary | ICD-10-CM | POA: Diagnosis present

## 2020-02-04 DIAGNOSIS — E1122 Type 2 diabetes mellitus with diabetic chronic kidney disease: Secondary | ICD-10-CM | POA: Diagnosis present

## 2020-02-04 DIAGNOSIS — M19042 Primary osteoarthritis, left hand: Secondary | ICD-10-CM | POA: Diagnosis present

## 2020-02-04 DIAGNOSIS — J9811 Atelectasis: Secondary | ICD-10-CM | POA: Diagnosis present

## 2020-02-04 DIAGNOSIS — R571 Hypovolemic shock: Secondary | ICD-10-CM | POA: Diagnosis not present

## 2020-02-04 DIAGNOSIS — I739 Peripheral vascular disease, unspecified: Secondary | ICD-10-CM | POA: Diagnosis not present

## 2020-02-04 DIAGNOSIS — Z800 Family history of malignant neoplasm of digestive organs: Secondary | ICD-10-CM

## 2020-02-04 DIAGNOSIS — E0781 Sick-euthyroid syndrome: Secondary | ICD-10-CM | POA: Diagnosis present

## 2020-02-04 DIAGNOSIS — N1831 Chronic kidney disease, stage 3a: Secondary | ICD-10-CM | POA: Diagnosis present

## 2020-02-04 DIAGNOSIS — N390 Urinary tract infection, site not specified: Secondary | ICD-10-CM | POA: Diagnosis present

## 2020-02-04 DIAGNOSIS — M109 Gout, unspecified: Secondary | ICD-10-CM | POA: Diagnosis present

## 2020-02-04 DIAGNOSIS — B961 Klebsiella pneumoniae [K. pneumoniae] as the cause of diseases classified elsewhere: Secondary | ICD-10-CM | POA: Diagnosis present

## 2020-02-04 DIAGNOSIS — J90 Pleural effusion, not elsewhere classified: Secondary | ICD-10-CM | POA: Diagnosis present

## 2020-02-04 DIAGNOSIS — A419 Sepsis, unspecified organism: Secondary | ICD-10-CM | POA: Diagnosis present

## 2020-02-04 DIAGNOSIS — E8770 Fluid overload, unspecified: Secondary | ICD-10-CM | POA: Diagnosis present

## 2020-02-04 DIAGNOSIS — I313 Pericardial effusion (noninflammatory): Secondary | ICD-10-CM | POA: Diagnosis not present

## 2020-02-04 DIAGNOSIS — Z7901 Long term (current) use of anticoagulants: Secondary | ICD-10-CM

## 2020-02-04 DIAGNOSIS — R627 Adult failure to thrive: Secondary | ICD-10-CM | POA: Diagnosis present

## 2020-02-04 DIAGNOSIS — L0390 Cellulitis, unspecified: Secondary | ICD-10-CM | POA: Diagnosis not present

## 2020-02-04 DIAGNOSIS — Z9884 Bariatric surgery status: Secondary | ICD-10-CM

## 2020-02-04 DIAGNOSIS — E1142 Type 2 diabetes mellitus with diabetic polyneuropathy: Secondary | ICD-10-CM | POA: Diagnosis present

## 2020-02-04 DIAGNOSIS — G43909 Migraine, unspecified, not intractable, without status migrainosus: Secondary | ICD-10-CM | POA: Diagnosis present

## 2020-02-04 DIAGNOSIS — E039 Hypothyroidism, unspecified: Secondary | ICD-10-CM | POA: Diagnosis present

## 2020-02-04 DIAGNOSIS — G4730 Sleep apnea, unspecified: Secondary | ICD-10-CM | POA: Diagnosis present

## 2020-02-04 DIAGNOSIS — Z79899 Other long term (current) drug therapy: Secondary | ICD-10-CM

## 2020-02-04 DIAGNOSIS — N170 Acute kidney failure with tubular necrosis: Secondary | ICD-10-CM | POA: Diagnosis present

## 2020-02-04 DIAGNOSIS — E785 Hyperlipidemia, unspecified: Secondary | ICD-10-CM | POA: Diagnosis present

## 2020-02-04 DIAGNOSIS — R5381 Other malaise: Secondary | ICD-10-CM | POA: Diagnosis present

## 2020-02-04 DIAGNOSIS — L89610 Pressure ulcer of right heel, unstageable: Secondary | ICD-10-CM | POA: Diagnosis present

## 2020-02-04 DIAGNOSIS — Z888 Allergy status to other drugs, medicaments and biological substances status: Secondary | ICD-10-CM

## 2020-02-04 DIAGNOSIS — I129 Hypertensive chronic kidney disease with stage 1 through stage 4 chronic kidney disease, or unspecified chronic kidney disease: Secondary | ICD-10-CM | POA: Diagnosis present

## 2020-02-04 DIAGNOSIS — E8809 Other disorders of plasma-protein metabolism, not elsewhere classified: Secondary | ICD-10-CM | POA: Diagnosis present

## 2020-02-04 DIAGNOSIS — Z833 Family history of diabetes mellitus: Secondary | ICD-10-CM

## 2020-02-04 DIAGNOSIS — E440 Moderate protein-calorie malnutrition: Secondary | ICD-10-CM | POA: Diagnosis present

## 2020-02-04 DIAGNOSIS — E43 Unspecified severe protein-calorie malnutrition: Secondary | ICD-10-CM | POA: Insufficient documentation

## 2020-02-04 DIAGNOSIS — K649 Unspecified hemorrhoids: Secondary | ICD-10-CM | POA: Diagnosis present

## 2020-02-04 DIAGNOSIS — Z885 Allergy status to narcotic agent status: Secondary | ICD-10-CM

## 2020-02-04 DIAGNOSIS — D509 Iron deficiency anemia, unspecified: Secondary | ICD-10-CM | POA: Diagnosis present

## 2020-02-04 DIAGNOSIS — E1151 Type 2 diabetes mellitus with diabetic peripheral angiopathy without gangrene: Secondary | ICD-10-CM | POA: Diagnosis present

## 2020-02-04 DIAGNOSIS — K219 Gastro-esophageal reflux disease without esophagitis: Secondary | ICD-10-CM | POA: Diagnosis present

## 2020-02-04 DIAGNOSIS — L89620 Pressure ulcer of left heel, unstageable: Secondary | ICD-10-CM | POA: Diagnosis present

## 2020-02-04 DIAGNOSIS — M19041 Primary osteoarthritis, right hand: Secondary | ICD-10-CM | POA: Diagnosis present

## 2020-02-04 DIAGNOSIS — Z940 Kidney transplant status: Secondary | ICD-10-CM

## 2020-02-04 DIAGNOSIS — Z20822 Contact with and (suspected) exposure to covid-19: Secondary | ICD-10-CM | POA: Diagnosis present

## 2020-02-04 DIAGNOSIS — M869 Osteomyelitis, unspecified: Secondary | ICD-10-CM

## 2020-02-04 LAB — CBC
HCT: 36 % (ref 36.0–46.0)
Hemoglobin: 11.1 g/dL — ABNORMAL LOW (ref 12.0–15.0)
MCH: 33.3 pg (ref 26.0–34.0)
MCHC: 30.8 g/dL (ref 30.0–36.0)
MCV: 108.1 fL — ABNORMAL HIGH (ref 80.0–100.0)
Platelets: 237 10*3/uL (ref 150–400)
RBC: 3.33 MIL/uL — ABNORMAL LOW (ref 3.87–5.11)
RDW: 16.1 % — ABNORMAL HIGH (ref 11.5–15.5)
WBC: 4.4 10*3/uL (ref 4.0–10.5)
nRBC: 0 % (ref 0.0–0.2)

## 2020-02-04 LAB — TROPONIN I (HIGH SENSITIVITY)
Troponin I (High Sensitivity): 14 ng/L (ref <18)
Troponin I (High Sensitivity): 11 ng/L (ref <18)

## 2020-02-04 LAB — URINALYSIS, ROUTINE W REFLEX MICROSCOPIC
Bilirubin Urine: NEGATIVE
Glucose, UA: >=500 mg/dL — AB
Ketones, ur: NEGATIVE mg/dL
Nitrite: POSITIVE — AB
Protein, ur: 100 mg/dL — AB
Specific Gravity, Urine: 1.025 (ref 1.005–1.030)
pH: 5 (ref 5.0–8.0)

## 2020-02-04 LAB — PROTIME-INR
INR: 1.5 — ABNORMAL HIGH (ref 0.8–1.2)
Prothrombin Time: 17.8 seconds — ABNORMAL HIGH (ref 11.4–15.2)

## 2020-02-04 LAB — COMPREHENSIVE METABOLIC PANEL
ALT: 19 U/L (ref 0–44)
AST: 24 U/L (ref 15–41)
Albumin: 1.6 g/dL — ABNORMAL LOW (ref 3.5–5.0)
Alkaline Phosphatase: 484 U/L — ABNORMAL HIGH (ref 38–126)
Anion gap: 8 (ref 5–15)
BUN: 28 mg/dL — ABNORMAL HIGH (ref 6–20)
CO2: 19 mmol/L — ABNORMAL LOW (ref 22–32)
Calcium: 8.6 mg/dL — ABNORMAL LOW (ref 8.9–10.3)
Chloride: 112 mmol/L — ABNORMAL HIGH (ref 98–111)
Creatinine, Ser: 2.74 mg/dL — ABNORMAL HIGH (ref 0.44–1.00)
GFR calc Af Amer: 21 mL/min — ABNORMAL LOW (ref >60)
GFR calc non Af Amer: 18 mL/min — ABNORMAL LOW (ref >60)
Glucose, Bld: 219 mg/dL — ABNORMAL HIGH (ref 70–99)
Potassium: 4 mmol/L (ref 3.5–5.1)
Sodium: 139 mmol/L (ref 135–145)
Total Bilirubin: 0.9 mg/dL (ref 0.3–1.2)
Total Protein: 5.2 g/dL — ABNORMAL LOW (ref 6.5–8.1)

## 2020-02-04 LAB — LACTIC ACID, PLASMA: Lactic Acid, Venous: 1.8 mmol/L (ref 0.5–1.9)

## 2020-02-04 LAB — RESPIRATORY PANEL BY RT PCR (FLU A&B, COVID)
Influenza A by PCR: NEGATIVE
Influenza B by PCR: NEGATIVE
SARS Coronavirus 2 by RT PCR: NEGATIVE

## 2020-02-04 LAB — URINALYSIS, MICROSCOPIC (REFLEX): WBC, UA: >50 WBC/hpf (ref 0–5)

## 2020-02-04 MED ORDER — ROPINIROLE HCL 0.25 MG PO TABS: 0.2500 mg | ORAL_TABLET | Freq: Every day | ORAL | Status: DC | PRN

## 2020-02-04 MED ORDER — ONDANSETRON HCL 4 MG/2ML IJ SOLN
4.0000 mg | Freq: Four times a day (QID) | INTRAMUSCULAR | Status: DC | PRN
  Administered 2020-02-08: 4 mg via INTRAVENOUS
  Filled 2020-02-04 (×2): qty 2

## 2020-02-04 MED ORDER — SODIUM CHLORIDE 0.9 % IV SOLN
1.0000 g | INTRAVENOUS | Status: DC
  Administered 2020-02-05 – 2020-02-06 (×2): 1 g via INTRAVENOUS
  Filled 2020-02-04 (×2): qty 10

## 2020-02-04 MED ORDER — SODIUM CHLORIDE 0.9 % IV BOLUS
1000.0000 mL | Freq: Once | INTRAVENOUS | Status: AC
  Administered 2020-02-04: 1000 mL via INTRAVENOUS

## 2020-02-04 MED ORDER — ALLOPURINOL 100 MG PO TABS
100.0000 mg | ORAL_TABLET | Freq: Every day | ORAL | Status: DC
  Administered 2020-02-05 – 2020-02-11 (×7): 100 mg via ORAL
  Filled 2020-02-04 (×7): qty 1

## 2020-02-04 MED ORDER — SEVELAMER CARBONATE 2.4 G PO PACK: 2.4000 g | PACK | ORAL | Status: DC

## 2020-02-04 MED ORDER — ACETAMINOPHEN 325 MG PO TABS: 650.0000 mg | ORAL_TABLET | Freq: Four times a day (QID) | ORAL | Status: DC | PRN

## 2020-02-04 MED ORDER — NORTRIPTYLINE HCL 25 MG PO CAPS
25.0000 mg | ORAL_CAPSULE | Freq: Every day | ORAL | Status: DC
  Administered 2020-02-05 – 2020-02-10 (×7): 25 mg via ORAL
  Filled 2020-02-04 (×8): qty 1

## 2020-02-04 MED ORDER — ONDANSETRON HCL 4 MG PO TABS
4.0000 mg | ORAL_TABLET | Freq: Four times a day (QID) | ORAL | Status: DC | PRN
  Administered 2020-02-05: 4 mg via ORAL
  Filled 2020-02-04: qty 1

## 2020-02-04 MED ORDER — TACROLIMUS 1 MG PO CAPS: 4.0000 mg | ORAL_CAPSULE | Freq: Two times a day (BID) | ORAL | Status: DC

## 2020-02-04 MED ORDER — INSULIN ASPART 100 UNIT/ML ~~LOC~~ SOLN
0.0000 [IU] | Freq: Every day | SUBCUTANEOUS | Status: DC
  Administered 2020-02-06: 2 [IU] via SUBCUTANEOUS

## 2020-02-04 MED ORDER — PANTOPRAZOLE SODIUM 40 MG PO TBEC
40.0000 mg | DELAYED_RELEASE_TABLET | Freq: Every day | ORAL | Status: DC
  Administered 2020-02-05 – 2020-02-11 (×7): 40 mg via ORAL
  Filled 2020-02-04 (×7): qty 1

## 2020-02-04 MED ORDER — SODIUM BICARBONATE 650 MG PO TABS: 1300.0000 mg | ORAL_TABLET | Freq: Three times a day (TID) | ORAL | Status: DC

## 2020-02-04 MED ORDER — CALCITRIOL 0.25 MCG PO CAPS
0.2500 ug | ORAL_CAPSULE | Freq: Every day | ORAL | Status: DC
  Administered 2020-02-05 – 2020-02-11 (×7): 0.25 ug via ORAL
  Filled 2020-02-04 (×7): qty 1

## 2020-02-04 MED ORDER — GABAPENTIN 100 MG PO CAPS: 100.0000 mg | ORAL_CAPSULE | ORAL | Status: DC

## 2020-02-04 MED ORDER — SENNOSIDES-DOCUSATE SODIUM 8.6-50 MG PO TABS: 1.0000 | ORAL_TABLET | Freq: Every evening | ORAL | Status: DC | PRN

## 2020-02-04 MED ORDER — FENTANYL CITRATE (PF) 100 MCG/2ML IJ SOLN
25.0000 ug | Freq: Once | INTRAMUSCULAR | Status: AC
  Administered 2020-02-04: 25 ug via INTRAVENOUS
  Filled 2020-02-04: qty 2

## 2020-02-04 MED ORDER — TACROLIMUS 1 MG PO CAPS
1.5000 mg | ORAL_CAPSULE | Freq: Two times a day (BID) | ORAL | Status: DC
  Administered 2020-02-05 – 2020-02-11 (×13): 1.5 mg via ORAL
  Filled 2020-02-04 (×16): qty 1

## 2020-02-04 MED ORDER — MYCOPHENOLATE MOFETIL 250 MG PO CAPS
1000.0000 mg | ORAL_CAPSULE | Freq: Two times a day (BID) | ORAL | Status: DC
  Administered 2020-02-05: 1000 mg via ORAL
  Filled 2020-02-04 (×2): qty 4

## 2020-02-04 MED ORDER — FAMOTIDINE 20 MG PO TABS
20.0000 mg | ORAL_TABLET | Freq: Every day | ORAL | Status: DC | PRN
  Administered 2020-02-08 – 2020-02-09 (×2): 20 mg via ORAL
  Filled 2020-02-04 (×2): qty 1

## 2020-02-04 MED ORDER — SODIUM CHLORIDE 0.9 % IV SOLN
1.0000 g | Freq: Once | INTRAVENOUS | Status: AC
  Administered 2020-02-04: 1 g via INTRAVENOUS
  Filled 2020-02-04: qty 10

## 2020-02-04 MED ORDER — HYDROMORPHONE HCL 2 MG PO TABS
1.0000 mg | ORAL_TABLET | Freq: Four times a day (QID) | ORAL | Status: DC | PRN
  Administered 2020-02-05 (×2): 2 mg via ORAL
  Administered 2020-02-06: 1 mg via ORAL
  Administered 2020-02-08 – 2020-02-10 (×3): 2 mg via ORAL
  Filled 2020-02-04 (×7): qty 1

## 2020-02-04 MED ORDER — METOPROLOL TARTRATE 50 MG PO TABS
50.0000 mg | ORAL_TABLET | Freq: Two times a day (BID) | ORAL | Status: DC
  Filled 2020-02-04: qty 1

## 2020-02-04 MED ORDER — ATORVASTATIN CALCIUM 10 MG PO TABS
20.0000 mg | ORAL_TABLET | Freq: Every day | ORAL | Status: DC
  Administered 2020-02-05 – 2020-02-10 (×7): 20 mg via ORAL
  Filled 2020-02-04 (×7): qty 2

## 2020-02-04 MED ORDER — INSULIN ASPART 100 UNIT/ML ~~LOC~~ SOLN
0.0000 [IU] | Freq: Three times a day (TID) | SUBCUTANEOUS | Status: DC
  Administered 2020-02-05 – 2020-02-06 (×3): 1 [IU] via SUBCUTANEOUS
  Administered 2020-02-06: 2 [IU] via SUBCUTANEOUS
  Administered 2020-02-07: 3 [IU] via SUBCUTANEOUS
  Administered 2020-02-07: 2 [IU] via SUBCUTANEOUS
  Administered 2020-02-07: 3 [IU] via SUBCUTANEOUS
  Administered 2020-02-08: 2 [IU] via SUBCUTANEOUS
  Administered 2020-02-08 (×2): 3 [IU] via SUBCUTANEOUS
  Administered 2020-02-09: 2 [IU] via SUBCUTANEOUS
  Administered 2020-02-09: 3 [IU] via SUBCUTANEOUS
  Administered 2020-02-09: 2 [IU] via SUBCUTANEOUS
  Administered 2020-02-10: 1 [IU] via SUBCUTANEOUS
  Administered 2020-02-10: 2 [IU] via SUBCUTANEOUS
  Administered 2020-02-10: 3 [IU] via SUBCUTANEOUS

## 2020-02-04 MED ORDER — PREDNISONE 5 MG PO TABS
5.0000 mg | ORAL_TABLET | Freq: Every day | ORAL | Status: DC
  Administered 2020-02-05: 5 mg via ORAL
  Filled 2020-02-04: qty 1

## 2020-02-04 MED ORDER — APIXABAN 5 MG PO TABS
5.0000 mg | ORAL_TABLET | Freq: Two times a day (BID) | ORAL | Status: DC
  Administered 2020-02-05 – 2020-02-11 (×14): 5 mg via ORAL
  Filled 2020-02-04 (×14): qty 1

## 2020-02-04 MED ORDER — K PHOS MONO-SOD PHOS DI & MONO 155-852-130 MG PO TABS
1.0000 | ORAL_TABLET | ORAL | Status: DC
  Administered 2020-02-05 – 2020-02-10 (×19): 1 via ORAL
  Filled 2020-02-04 (×21): qty 1

## 2020-02-04 MED ORDER — VITAMIN D 25 MCG (1000 UNIT) PO TABS
1000.0000 [IU] | ORAL_TABLET | Freq: Every day | ORAL | Status: DC
  Administered 2020-02-05 – 2020-02-11 (×7): 1000 [IU] via ORAL
  Filled 2020-02-04 (×7): qty 1

## 2020-02-04 NOTE — ED Provider Notes (Signed)
Care assumed from Dr. Sedonia Small.  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    Patient is already received antibiotics and she will be a challenge for fluid balance and correction of her AKI.  Patient had an unremarkable ultrasound of the transplanted kidney and CT scan shows no significant hydronephrosis or other abnormality with transplant kidney.  CT scan did however show a moderate pericardial effusion and bilateral pleural effusions with some atelectasis.  There is also hypodensity in the spleen which is new since prior study with trace ascites.  Clinically, patient has some fluid overload based on this imaging but has AKI and will need to be gently rehydrated safely.  Patient reports that she has had her transplant care down in [place] at [facility] as well as [facility] however she is no longer seeing those facilities and would like to get established here in [place].  She would like to be admitted here for her care for the UTI causing hypotension and AKI.   Clinical Impression: 1. Lower urinary tract infectious disease   2. Hypotension, unspecified hypotension type   3. Immunocompromised ([place])     Disposition: Admit  This note was prepared with assistance of Dragon voice recognition software. Occasional wrong-word or sound-a-like substitutions may have occurred due to the inherent limitations of voice recognition software.     Wanda [name], Gwenyth Allegra, MD 02/05/20 [phone]

## 2020-02-04 NOTE — ED Notes (Signed)
Pt transported to ultrasound.

## 2020-02-04 NOTE — ED Triage Notes (Signed)
Pt here from doctors office for hypotension. 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 Per Ems BP at doctors office was 88/50, pt has weak radial pulses. Ems gave 260ml of normal saline. Pt aox4, vss

## 2020-02-04 NOTE — ED Notes (Signed)
Attempted to call report; advised that nurse not ready yet. Was asked to call back in a few minutes. Will attempt report in a few minutes.

## 2020-02-04 NOTE — ED Provider Notes (Addendum)
[facility] Emergency Department Provider Note MRN:  [id number]  Arrival date & time: 02/04/20     Chief Complaint   Hypotension   History of Present Illness   Wanda [name] is a 59 y.o. year-old female with a history of diabetes, CKD status post kidney transplant presenting to the ED with chief complaint of hypertension.  Patient is endorsing recent lightheadedness when sitting up, general malaise, weakness, fatigue, some burning with urination recently.  She denies fever, no cough, no chest pain or shortness of breath.  Endorses mild abdominal pain currently, no recent vomiting or diarrhea.  Taking her medications like normal.  Review of Systems  A complete 10 system review of systems was obtained and all systems are negative except as noted in the HPI and PMH.   Patient's Health History    Past Medical History:  Diagnosis Date  . Anemia   . Arthritis    "hands" (07/24/2013) pt denies  . Chronic kidney disease, stage IV (severe) ([place])    "never went on dialysis" (07/24/2013)  . Diabetes mellitus type II   . Diabetic peripheral neuropathy ([place])   . Dialysis patient [facility])    Monday Wednesday Friday   . Dyspnea on exertion    pt denies  . Foot pain   . GERD (gastroesophageal reflux disease)   . Gout   . History of chicken pox   . History of measles   . History of mumps   . Hyperlipidemia   . Hypothyroidism   . Migraines    "q 3 months" (07/24/2013)  . Nonspecific abnormal unspecified cardiovascular function study   . Obesity   . PAD (peripheral artery disease) ([place])   . Peripheral neuropathy   . Peripheral vascular disease ([place])   . Pneumonia 09/2012  . Renal transplant disorder   . Shingles   . Sleep apnea    "getting ready to get tested again cause dr says I have it" (07/24/2013) pt states was tested and was told she did not have sleep apnea / no cpap  . Unspecified essential hypertension     Past Surgical History:  Procedure  Laterality Date  . AV FISTULA PLACEMENT Right 2000   "lower arm" (07/24/2013)  . AV FISTULA PLACEMENT, RADIOCEPHALIC Right 9983   "upper arm" (07/24/2013)  . CAPD REMOVAL N/A 10/10/2016   Procedure: CONTINUOUS AMBULATORY PERITONEAL DIALYSIS  (CAPD) CATHETER REMOVAL;  Surgeon: Donnie Mesa, MD;  Location: [facility];  Service: General;  Laterality: N/A;  . CATARACT EXTRACTION W/ INTRAOCULAR LENS  IMPLANT, BILATERAL Bilateral 2011  . [place]; 1987  . CHOLECYSTECTOMY  2011  . COLONOSCOPY    . ENDOMETRIAL CRYOABLATION  1980's  . INTRAUTERINE DEVICE (IUD) INSERTION  2011  . LAPAROSCOPIC GASTRIC SLEEVE RESECTION N/A 05/26/2015   Procedure: LAPAROSCOPIC GASTRIC SLEEVE RESECTION;  Surgeon: Excell Seltzer, MD;  Location: [facility];  Service: General;  Laterality: N/A;  . TRANSPLANTATION RENAL  2013   "right" (09/04/2012)  . TUBAL LIGATION  1987  . [place]   "RLE; stripped" (09/04/2012)  . VIDEO BRONCHOSCOPY  09/07/2012   Procedure: VIDEO BRONCHOSCOPY WITHOUT FLUORO;  Surgeon: Chesley Mires, MD;  Location: [facility] END[facility]COPY;  Service: Cardiopulmonary;  Laterality: Bilateral;    Family History  Problem Relation Age of Onset  . Colon cancer Father        deceased at age 69  . Diabetes Brother   . Neuropathy Neg Hx     Social History  Socioeconomic History  . Marital status: Married    Spouse name: [name]  . Number of children: 2  . Years of education: 34  . Highest education level: Not on file  Occupational History  . Occupation: Retired- disabled  Tobacco Use  . Smoking status: Never Smoker  . Smokeless tobacco: Never Used  Substance and Sexual Activity  . Alcohol use: No  . Drug use: No  . Sexual activity: Yes  Other Topics Concern  . Not on file  Social History Narrative   Lives with husband and child.   Caffeine use: none   Social Determinants of Radio broadcast assistant Strain:   . Difficulty of Paying Living Expenses:   Food Insecurity:   .  Worried About Charity fundraiser in the Last Year:   . Arboriculturist in the Last Year:   Transportation Needs:   . Film/video editor (Medical):   [name] Kitchen Lack of Transportation (Non-Medical):   Physical Activity:   . Days of Exercise per Week:   . Minutes of Exercise per Session:   Stress:   . Feeling of Stress :   Social Connections:   . Frequency of Communication with Friends and Family:   . Frequency of Social Gatherings with Friends and Family:   . Attends Religious Services:   . Active Member of Clubs or Organizations:   . Attends Archivist Meetings:   [name] Kitchen Marital Status:   Intimate Partner Violence:   . Fear of Current or Ex-Partner:   . Emotionally Abused:   [name] Kitchen Physically Abused:   . Sexually Abused:      Physical Exam   Vitals:   02/04/20 1430 02/04/20 1445  BP: 109/67 110/73  Pulse: 67 66  Resp: 13 14  Temp:    SpO2: 100% 100%    CONSTITUTIONAL: Chronically ill-appearing, NAD NEURO:  Alert and oriented x 3, no focal deficits EYES:  eyes equal and reactive ENT/NECK:  no LAD, no JVD CARDIO: Regular rate, well-perfused, normal S1 and S2 PULM:  CTAB no wheezing or rhonchi GI/GU:  normal bowel sounds, non-distended, non-tender MSK/SPINE:  No gross deformities, no edema SKIN:  no rash, atraumatic; AV fistula to right arm with strong thrill PSYCH:  Appropriate speech and behavior  *Additional and/or pertinent findings included in MDM below  Diagnostic and Interventional Summary    EKG Interpretation  Date/Time:  Tuesday Feb 04 2020 13:18:02 EDT Ventricular Rate:  70 PR Interval:    QRS Duration: 111 QT Interval:  473 QTC Calculation: 511 R Axis:   49 Text Interpretation: Sinus rhythm Anteroseptal infarct, old Prolonged QT interval Confirmed by Gerlene Fee [phone]) on 02/04/2020 2:24:11 PM      Labs Reviewed  COMPREHENSIVE METABOLIC PANEL - Abnormal; Notable for the following components:      Result Value   Chloride 112 (*)    CO2 19 (*)     Glucose, Bld 219 (*)    BUN 28 (*)    Creatinine, Ser 2.74 (*)    Calcium 8.6 (*)    Total Protein 5.2 (*)    Albumin 1.6 (*)    Alkaline Phosphatase 484 (*)    GFR calc non Af Amer 18 (*)    GFR calc Af Amer 21 (*)    All other components within normal limits  PROTIME-INR - Abnormal; Notable for the following components:   Prothrombin Time 17.8 (*)    INR 1.5 (*)    All other components  within normal limits  URINALYSIS, ROUTINE W REFLEX MICROSCOPIC - Abnormal; Notable for the following components:   APPearance TURBID (*)    Glucose, UA >=500 (*)    Hgb urine dipstick LARGE (*)    Protein, ur 100 (*)    Nitrite POSITIVE (*)    Leukocytes,Ua MODERATE (*)    All other components within normal limits  CBC - Abnormal; Notable for the following components:   RBC 3.33 (*)    Hemoglobin 11.1 (*)    MCV 108.1 (*)    RDW 16.1 (*)    All other components within normal limits  URINALYSIS, MICROSCOPIC (REFLEX) - Abnormal; Notable for the following components:   Bacteria, UA MANY (*)    Non Squamous Epithelial PRESENT (*)    All other components within normal limits  CULTURE, BLOOD (ROUTINE X 2)  CULTURE, BLOOD (ROUTINE X 2)  URINE CULTURE  LACTIC ACID, PLASMA  TROPONIN I (HIGH SENSITIVITY)  TROPONIN I (HIGH SENSITIVITY)    US Renal Transplant w/Doppler  Final Result    DG Chest Port 1 View  Final Result    CT ABDOMEN PELVIS WO CONTRAST    (Results Pending)    Medications  sodium chloride 0.9 % bolus 1,000 mL (1,000 mLs Intravenous New Bag/Given 02/04/20 1338)  fentaNYL (SUBLIMAZE) injection 25 mcg (25 mcg Intravenous Given 02/04/20 1338)  cefTRIAXone (ROCEPHIN) 1 g in sodium chloride 0.9 % 100 mL IVPB (1 g Intravenous New Bag/Given 02/04/20 1350)     Procedures  /  Critical Care Procedures  ED Course and Medical Decision Making  I have reviewed the triage vital signs, the nursing notes, and pertinent available records from the EMR.  Listed above are laboratory and imaging  tests that I personally ordered, reviewed, and interpreted and then considered in my medical decision making (see below for details).      Hypotension, immunocompromised, abdominal pain, will obtain CT imaging of the abdomen as well as ultrasound of the transplanted kidney, providing empiric antibiotics starting with ceftriaxone to cover for UTI given patient's history of dysuria recently.  Awaiting imaging, urinalysis seems consistent with infection, anticipating need for admission given her immunocompromise state and initial hypotension, which seems to have resolved with fluids.  Will admit to hospitalist service after imaging, signed out to oncoming provider at shift change.  Addendum 3:50 PM: Patient had her initial transplant surgery back in 2015, which subsequently failed and was removed.  This was done at [facility].  She has since had a transplant surgery at [facility] in [place].  She explains that she is supposed to be following at [facility], she no longer follows at [facility].  She lives close to here and would prefer to stay at [facility].  She has AKI and states that her baseline creatinine is 2.0.  This seems to be explained by dehydration and UTI.  If nonacute CT, would be appropriate to admit to [facility] for continued management.  Barth Kirks. Sedonia Small, [place] [email or web address]  Final Clinical Impressions(s) / ED Diagnoses     ICD-10-CM   1. Lower urinary tract infectious disease  N39.0   2. Hypotension, unspecified hypotension type  I95.9   3. Immunocompromised ([place])  D84.9     ED Discharge Orders    None       Discharge Instructions Discussed with and Provided to Patient:   Discharge Instructions   None       Maudie Flakes,  MD 02/04/20 1526    Maudie Flakes, MD 02/04/20 [phone]

## 2020-02-04 NOTE — ED Notes (Signed)
Pt requesting pain meds, Opyd Md notified.

## 2020-02-04 NOTE — H&P (Addendum)
History and Physical    Wanda [name] UMP:[id number] DOB: 1960-10-25 DOA:  02/04/2020   PCP: Lorene Dy, MD   Patient coming from: Home  I have personally briefly reviewed patient's old medical records in [place]  Chief Complaint: Dysuria, hypotension  HPI: Wanda [name] is a 59 y.o. female with medical history significant of T2DM, HTN, Paroxysmal A. Fib, HLD, ESRD with failed kidney transplant 2013 and repeat transplant September 2020 at [facility], microcytic anemia, Hx of gastric sleeve who presents with dysuria and hypotension.  Patient reports she was a outpatient appointment and noted to be hypotensive.  She reports at h21me her baseline has been between 40-086P systolic but she has noticed that when she sits up she has been lightheaded.  No fevers or chills.  She denies any chest pain, pressure, cough or shortness of breath.  She reports she had some nausea/vomiting x 1 in ER but none prior.  She lives at home with her husband and daughter.  She reports she is wheelchair bound.  She cannot stand, relies on family for transfers.  Denies smoking, EtOH or Drugs  She is scheduled to follow-up with [facility] transplant team on May 22.  She does want to transition her care locally.  She is no longer on HD, previously MWF prior to transplant.   Review of Systems: As per HPI otherwise 10 point review of systems negative.    Past Medical History:  Diagnosis Date   Anemia    Arthritis    "hands" (07/24/2013) pt denies   Chronic kidney disease, stage IV (severe) ([place])    "never went on dialysis" (07/24/2013)   Diabetes mellitus type II    Diabetic peripheral neuropathy [facility])    Dialysis patient [facility])    Monday Wednesday Friday    Dyspnea on exertion    pt denies   Foot pain    GERD (gastroesophageal reflux disease)    Gout    History of chicken pox    History of measles    History of mumps    Hyperlipidemia    Hypothyroidism    Migraines    "q 3  months" (07/24/2013)   Nonspecific abnormal unspecified cardiovascular function study    Obesity    PAD (peripheral artery disease) (HCC)    Peripheral neuropathy    Peripheral vascular disease ([place])    Pneumonia 09/2012   Renal transplant disorder    Shingles    Sleep apnea    "getting ready to get tested again cause dr says I have it" (07/24/2013) pt states was tested and was told she did not have sleep apnea / no cpap   Unspecified essential hypertension     Past Surgical History:  Procedure Laterality Date   AV FISTULA PLACEMENT Right 2000   "lower arm" (07/24/2013)   AV FISTULA PLACEMENT, RADIOCEPHALIC Right 6195   "upper arm" (07/24/2013)   CAPD REMOVAL N/A 10/10/2016   Procedure: CONTINUOUS AMBULATORY PERITONEAL DIALYSIS  (CAPD) CATHETER REMOVAL;  Surgeon: Donnie Mesa, MD;  Location: [place];  Service: General;  Laterality: N/A;   CATARACT EXTRACTION W/ INTRAOCULAR LENS  IMPLANT, BILATERAL Bilateral 2011   [place]; [place] CRYOABLATION  1980's   INTRAUTERINE DEVICE (IUD) INSERTION  2011   LAPAROSCOPIC GASTRIC SLEEVE RESECTION N/A 05/26/2015   Procedure: LAPAROSCOPIC GASTRIC SLEEVE RESECTION;  Surgeon: Excell Seltzer, MD;  Location: [facility];  Service: General;  Laterality:  N/A;   TRANSPLANTATION RENAL  2013   "right" (09/04/2012)   [place]   "RLE; stripped" (09/04/2012)   VIDEO BRONCHOSCOPY  09/07/2012   Procedure: VIDEO BRONCHOSCOPY WITHOUT FLUORO;  Surgeon: Chesley Mires, MD;  Location: [place];  Service: Cardiopulmonary;  Laterality: Bilateral;     reports that she has never smoked. She has never used smokeless tobacco. She reports that she does not drink alcohol or use drugs.  Allergies  Allergen Reactions   Hydrocodone-Acetaminophen Other (See Comments)    Severe headache   Hydrocodone-Acetaminophen     Other reaction(s):  headache, Other (See Comments), Unknown Severe headache    Propoxyphene N-Acetaminophen Other (See Comments)    Severe headache   Astemizole     Other reaction(s): headache, Other (See Comments), Unknown Rash    Cephalosporins Rash    Other reaction(s): Other (See Comments), Unknown Rash     Family History  Problem Relation Age of Onset   Colon cancer Father        deceased at age 42   Diabetes Brother    Neuropathy Neg Hx      Prior to Admission medications   Medication Sig Start Date End Date Taking? Authorizing Provider  allopurinol (ZYLOPRIM) 100 MG tablet Take 2 tablets (200 mg total) by mouth daily. 12/02/17  Yes Neese, [place], NP  amLODipine (NORVASC) 5 MG tablet Take 10 mg by mouth every 12 (twelve) hours.    Yes [provider]  apixaban (ELIQUIS) 5 MG TABS tablet Take 5 mg by mouth every 12 (twelve) hours.   Yes [provider]  atorvastatin (LIPITOR) 20 MG tablet Take 20 mg by mouth at bedtime.   Yes [provider]  calcitRIOL (ROCALTROL) 0.25 MCG capsule Take by mouth daily. 12/29/17  Yes [provider]  Cholecalciferol (VITAMIN D-3) 25 MCG (1000 UT) CAPS Take 1,000 Units by mouth daily.   Yes [provider]  clonazePAM (KLONOPIN) 1 MG tablet Take 1 mg by mouth daily.    Yes [provider]  colchicine (COLCRYS) 0.6 MG tablet Take 0.6 mg by mouth daily as needed (gout).  12/30/15  Yes [provider]  famotidine (PEPCID) 20 MG tablet Take 20 mg by mouth daily as needed for heartburn. 06/30/18  Yes [provider]  gabapentin (NEURONTIN) 100 MG capsule TAKE ONE CAPSULE BY MOUTH DAILY PLUS AN ADDITIONAL 1 CAPSULE POST-DIALYSIS Patient taking differently: Take 100 mg by mouth See admin instructions. Take one capsule by mouth daily plus and additional 1 capsule post dialysis 02/11/19  Yes Jamse Arn, MD  hydrOXYzine (ATARAX/VISTARIL) 10 MG tablet Take 10 mg by mouth 2 (two) times daily as  needed for itching. 04/19/18  Yes [provider]  indomethacin (INDOCIN) 25 MG capsule Take 25 mg by mouth daily as needed for pain. 06/27/18  Yes [provider]  insulin aspart (NOVOLOG FLEXPEN) 100 UNIT/ML FlexPen Inject 12 Units into the skin 3 (three) times daily with meals. And pen needles 3/day 02/22/18  Yes Renato Shin, MD  K Phos Mono-Sod Phos Sharma Covert & Mono (PHOSPHA 250 NEUTRAL) [phone] MG TABS Take 1 tablet by mouth in the morning, at noon, and at bedtime.   Yes [provider]  L-Methylfolate-Algae-B12-B6 [place]ade [place]) [phone] MG CAPS Take 1 tablet by mouth daily. 01/18/17  Yes [provider]  metoprolol tartrate (LOPRESSOR) 50 MG tablet Take 50 mg by mouth 2 (two) times daily.   Yes  [provider]  midodrine (PROAMATINE) 10 MG tablet Take 5 mg by mouth See admin instructions. Take 1 tablet 30 minutes prior to dialysis on Mon,Wed,Fri. May repeat mid Tues,Thurs if needed 01/20/18  Yes [provider]  mycophenolate (CELLCEPT) 500 MG tablet Take 1,000 mg by mouth every 12 (twelve) hours.  07/17/19  Yes [provider]  nortriptyline (PAMELOR) 25 MG capsule TAKE 1 CAPSULE (25 MG TOTAL) BY MOUTH AT BEDTIME. 04/01/19  Yes Patel, Domenick Bookbinder, MD  pantoprazole (PROTONIX) 40 MG tablet Take 40 mg by mouth daily. 01/16/18  Yes [provider]  predniSONE (DELTASONE) 5 MG tablet Take 5 mg by mouth daily with breakfast.   Yes [provider]  pregabalin (LYRICA) 50 MG capsule Take 50 mg by mouth at bedtime.   Yes [provider]  rOPINIRole (REQUIP) 0.25 MG tablet Take 0.25 mg by mouth daily as needed (neuropathy).  01/29/18  Yes [provider]  sevelamer carbonate (RENVELA) 2.4 g PACK Take 2.4 g by mouth See admin instructions. Sprinkle 1 packet (2.4 g) with every meal and with snacks   Yes [provider]  sodium bicarbonate 650 MG tablet Take 1,300 mg by mouth 3 (three) times daily.   Yes  [provider]  sulfamethoxazole-trimethoprim (BACTRIM) 400-80 MG tablet Take 1 tablet by mouth daily.   Yes [provider]  tacrolimus (PROGRAF) 1 MG capsule Take 4 mg by mouth in the morning and at bedtime. 07/17/19  Yes [provider]  traZODone (DESYREL) 100 MG tablet Take 100 mg by mouth at bedtime as needed for sleep.  06/13/18  Yes [provider]  Vitamin D, Ergocalciferol, (DRISDOL) 50000 units CAPS capsule TAKE 1 CAPSULE (50,000 UNITS TOTAL) BY MOUTH EVERY 7 (SEVEN) DAYS. 03/29/18  Yes Jamse Arn, MD  calcium gluconate 500 MG tablet Take 1 tablet (500 mg total) by mouth 3 (three) times daily for 21 days. 12/16/19 01/06/20  Fatima Blank, MD    Physical Exam: Vitals:   02/04/20 1746 02/04/20 1800 02/04/20 1815 02/04/20 1830  BP: 119/77 102/66 110/68 105/67  Pulse: 71 69 69 69  Resp: 11 13 14 18   Temp:      TempSrc:      SpO2: 100% 100% 100% 100%  Weight:      Height:        Vitals:   02/04/20 1746 02/04/20 1800 02/04/20 1815 02/04/20 1830  BP: 119/77 102/66 110/68 105/67  Pulse: 71 69 69 69  Resp: 11 13 14 18   Temp:      TempSrc:      SpO2: 100% 100% 100% 100%  Weight:      Height:         Constitutional: Pleasant, NAD, calm, comfortable Eyes: PERRL, EOMI, lids and conjunctivae normal ENMT: Mucous membranes are moist. Posterior pharynx clear of any exudate or lesions.  Neck: normal, supple, no masses, no thyromegaly Respiratory: clear to auscultation bilaterally, no wheezing, no crackles. Normal respiratory effort. No accessory muscle use.  Cardiovascular: Regular rate and rhythm, no murmurs / rubs / gallops. 2+ pulses Abdomen: no tenderness, no masses palpated. No hepatosplenomegaly. Bowel sounds positive.  Musculoskeletal: no clubbing / cyanosis. No joint deformity upper and lower extremities. Good ROM, no contractures. Normal muscle tone. RUE fistula. Skin: no rashes, lesions, ulcers. No induration Neurologic: CN  2-12 grossly intact. No new gross deficits appreicated Psychiatric: Normal judgment and insight. Alert and oriented x 3. Normal mood.    Labs on Admission:  I have personally reviewed following labs and imaging studies  CBC: Recent Labs  Lab 02/04/20 1255  WBC 4.4  HGB 11.1*  HCT 36.0  MCV 108.1*  PLT 676   Basic Metabolic Panel: Recent Labs  Lab 02/04/20 1315  NA 139  K 4.0  CL 112*  CO2 19*  GLUCOSE 219*  BUN 28*  CREATININE 2.74*  CALCIUM 8.6*   GFR: Estimated Creatinine Clearance: 23.1 mL/min (A) (by C-G formula based on SCr of 2.74 mg/dL (H)). Liver Function Tests: Recent Labs  Lab 02/04/20 1315  AST 24  ALT 19  ALKPHOS 484*  BILITOT 0.9  PROT 5.2*  ALBUMIN 1.6*   No results for input(s): LIPASE, AMYLASE in the last 168 hours. No results for input(s): AMMONIA in the last 168 hours. Coagulation Profile: Recent Labs  Lab 02/04/20 1315  INR 1.5*   Cardiac Enzymes: No results for input(s): CKTOTAL, CKMB, CKMBINDEX, TROPONINI in the last 168 hours. BNP (last 3 results) No results for input(s): PROBNP in the last 8760 hours. HbA1C: No results for input(s): HGBA1C in the last 72 hours. CBG: No results for input(s): GLUCAP in the last 168 hours. Lipid Profile: No results for input(s): CHOL, HDL, LDLCALC, TRIG, CHOLHDL, LDLDIRECT in the last 72 hours. Thyroid Function Tests: No results for input(s): TSH, T4TOTAL, FREET4, T3FREE, THYROIDAB in the last 72 hours. Anemia Panel: No results for input(s): VITAMINB12, FOLATE, FERRITIN, TIBC, IRON, RETICCTPCT in the last 72 hours. Urine analysis:    Component Value Date/Time   COLORURINE YELLOW 02/04/2020 1352   APPEARANCEUR TURBID (A) 02/04/2020 1352   LABSPEC 1.025 02/04/2020 1352   PHURINE 5.0 02/04/2020 1352   GLUCOSEU >=500 (A) 02/04/2020 1352   HGBUR LARGE (A) 02/04/2020 1352   BILIRUBINUR NEGATIVE 02/04/2020 1352   KETONESUR NEGATIVE 02/04/2020 1352   PROTEINUR 100 (A) 02/04/2020 1352    UROBILINOGEN 0.2 12/22/2013 1950   NITRITE POSITIVE (A) 02/04/2020 1352   LEUKOCYTESUR MODERATE (A) 02/04/2020 1352    Radiological Exams on Admission: CT ABDOMEN PELVIS WO CONTRAST  Result Date: 02/04/2020 CLINICAL DATA:  Hypotension, renal transplant EXAM: CT ABDOMEN AND PELVIS WITHOUT CONTRAST TECHNIQUE: Multidetector CT imaging of the abdomen and pelvis was performed following the standard protocol without IV contrast. COMPARISON:  07/31/2016, 02/04/2020 FINDINGS: Lower chest: There is a moderate pericardial effusion. Calcification of the mitral annulus is noted. The heart is not enlarged. There are bilateral pleural effusions, left greater than right. Compressive atelectasis left lower lobe. Hepatobiliary: No focal liver abnormality is seen. Status post cholecystectomy. No biliary dilatation. Pancreas: Unremarkable. No pancreatic ductal dilatation or surrounding inflammatory changes. Spleen: Indeterminate wedge-shaped hypodensity within the posteromedial aspect of the spleen measures up to 3.7 cm. This is nonspecific, with limited evaluation on unenhanced study. The remainder of the spleen is unremarkable. Adrenals/Urinary Tract: Native kidneys are markedly atrophic. Left renal cysts are again noted, minimally complex, and decreased in size since prior study. The adrenals are unremarkable. There is atrophied and calcified failed right lower quadrant transplant kidney. Left lower quadrant transplant kidney is grossly unremarkable in appearance with no evidence of hydronephrosis. The bladder is minimally distended with no gross abnormalities. Stomach/Bowel: No bowel obstruction or ileus. No bowel wall thickening or inflammatory change. Normal appendix right lower quadrant. Vascular/Lymphatic: Extensive atherosclerosis is seen throughout the abdominal vasculature. No pathologic adenopathy. Reproductive: Uterus and bilateral adnexa are unremarkable. Other: There is trace free fluid within the left upper  quadrant and pelvis. Extensive subcutaneous edema throughout the abdominal wall and subcutaneous  tissues compatible with anasarca. No free intraperitoneal gas. No abdominal wall hernia. Musculoskeletal: No acute or destructive bony lesions. Reconstructed images demonstrate no additional findings. IMPRESSION: 1. Moderate pericardial effusion. 2. Bilateral pleural effusions, left greater than right, with compressive atelectasis of the left lower lobe. 3. Indeterminate wedge-shaped hypodensity within the posteromedial aspect of the spleen measures up to 3.7 cm. This is nonspecific on an unenhanced exam, but does appear new since prior study. 4. Trace ascites. 5. Anasarca. 6. Left lower quadrant transplant kidney as above. Electronically Signed   By: Randa Ngo M.D.   On: 02/04/2020 17:49   US Renal Transplant w/Doppler  Result Date: 02/04/2020 CLINICAL DATA:  Hypotension EXAM: ULTRASOUND OF RENAL TRANSPLANT WITH RENAL DOPPLER ULTRASOUND TECHNIQUE: Ultrasound examination of the renal transplant was performed with gray-scale, color and duplex doppler evaluation. COMPARISON:  07/31/2016 FINDINGS: Transplant kidney location: RLQ Transplant Kidney: Renal measurements: 11.8 x 5.4 by 5.5 cm = volume: 13mL. Normal in size and parenchymal echogenicity. No evidence of mass or hydronephrosis. No peri-transplant fluid collection seen. Color flow in the main renal artery:  Yes Color flow in the main renal vein:  Yes Duplex Doppler Evaluation: Main Renal Artery Resistive Index: 0.83 Venous waveform in main renal vein:  Present Intrarenal resistive index in upper pole:  0.76 (normal 0.6-0.8; equivocal 0.8-0.9; abnormal >= 0.9) Intrarenal resistive index in lower pole: 0.60 (normal 0.6-0.8; equivocal 0.8-0.9; abnormal >= 0.9) Bladder: Minimal debris within the urinary bladder, which is overall decompressed. Ureteral jets are not visualized. Other findings:  Trace free fluid right lower quadrant. IMPRESSION: 1. Unremarkable  right lower quadrant transplant kidney. 2. Trace free fluid right lower quadrant. Electronically Signed   By: Randa Ngo M.D.   On: 02/04/2020 15:21   DG Chest Port 1 View  Result Date: 02/04/2020 CLINICAL DATA:  Hypotension. EXAM: PORTABLE CHEST 1 VIEW COMPARISON:  12/15/2019. FINDINGS: Mediastinum hilar structures normal. Left base atelectasis and infiltrate. Small left pleural effusion cannot be excluded. No pneumothorax. Heart size normal. No acute bony abnormality. IMPRESSION: Left base atelectasis and infiltrate. Small left pleural effusion cannot be excluded. Electronically Signed   By: Marcello Moores  Register   On: 02/04/2020 14:22    EKG: Independently reviewed.   Assessment/Plan FLOETTA BRICKEY is a 59 y.o. female with medical history significant of T2DM, HTN, HLD, Hypothyroidism, ESRD with failed kidney transplant 2013 and repeat transplant September 2020 at [facility], microcytic anemia, Hx of gastric sleeve who presents with dysuria and hypotension concerning for UTI, but also with evidence of volume overload.  # UTI # AKI - patient with suprapubic tenderness and dysuria, AKI which has been suspected to be pre-renal in setting of infection.  Patient has received 1 L of fluid in the ER along with Ceftriaxone for UTI.  Interestingly, patient is on Bactrim which bug is presumably resistant to, but Bactrim independently can raise creatinine in absence of injury.  Of note, patient reports her baseline Bps at home to be approximately 90-120s.  Her symptoms of dizziness and lightheadedness with sitting favor orthostasis and possible intravascular depletion despite imaging showing bilateral pleural effusions, and pericardial effusion (first noted in care everywhere on echo in 2014). - for now will continue to follow urine and blood cultures - continue Ceftriaxone  # Volume overload, bilateral pleural effusions - patient with bilateral pleural effusions, no reported or chart hx of CHF and clinically  symptoms not suggestive of CHF (without orthopnea, PND) - will order formal Echo, she has moderate effusion by CT,  EKG does not show low voltage - will add on BNP - will hold off on fluids/diuretics until more data, will request eval of IVC with Echo, eval of pericardial effusion and repeat Creatinine in AM  # ESRD, now s/p Renal Transplant # Anemia of chronic renal disease - with AKI, presumed pre-renal in setting of Infection.  Patient underwent Renal Transplant at [facility] in September 2020. - patient not septic, would continue immunosuppressants for now and have held Bactrim presumably for PCP Prophylaxis at this time while AKI is worked out - continue phos binder, calcium  # Paroxysmal Atrial Fibrillation - continue metoprolol and apixaban  # Hypotension, chart Hx of HTN (treated) - given hypotension hx is more long-standing and with coming to seated position will evaluate orthostatics, Echo for pericardial effusion (may warrant Cardiology input - consultation pending results); will first stop iatrogenic etiologies including amlodipine at this time - will continue metoprolol as less effect on BP but monitor - also patient has been on relatively low dose of prednisone but could consider stim test if above tests are negative and patient still with persistent hypotension  # Hypothyroidism - not presently on levothyroxine, listed in chart - previously on a dose of 50 mcg - obtain TFTs in AM  # T2DM c/b nephropathy, neuropathy - on both pregabalin and gabapentin? - continue gabapentin - repeat A1c for AM - appears to only have mealtime coverage without basal; monitor A1c  # HLD - continue atorvastatin  # Gout - continue allopurinol dose-reduced at 100 mg daily  # Med rec assistance - requesting pharmD input to assist with some of meds, dual gaba analogs (gabapentin and pregabalin), no longer on LT4?   DVT prophylaxis: Apixaban  Code Status: Full Disposition Plan:  pending Admission status: inpatient   Truddie Hidden MD [facility] Pager [phone]  If 7PM-7AM, please contact night-coverage [email or web address] Password [facility]  02/04/2020, 6:38 PM

## 2020-02-04 NOTE — ED Notes (Signed)
RN unable to obtain 2nd set of cultures. Saralyn Pilar NT also unable to obtain 2nd set of cultures

## 2020-02-05 ENCOUNTER — Inpatient Hospital Stay (HOSPITAL_COMMUNITY): Payer: Medicare HMO

## 2020-02-05 DIAGNOSIS — N390 Urinary tract infection, site not specified: Secondary | ICD-10-CM

## 2020-02-05 DIAGNOSIS — E440 Moderate protein-calorie malnutrition: Secondary | ICD-10-CM | POA: Insufficient documentation

## 2020-02-05 DIAGNOSIS — I313 Pericardial effusion (noninflammatory): Secondary | ICD-10-CM

## 2020-02-05 DIAGNOSIS — E43 Unspecified severe protein-calorie malnutrition: Secondary | ICD-10-CM | POA: Insufficient documentation

## 2020-02-05 LAB — GLUCOSE, CAPILLARY
Glucose-Capillary: 113 mg/dL — ABNORMAL HIGH (ref 70–99)
Glucose-Capillary: 128 mg/dL — ABNORMAL HIGH (ref 70–99)
Glucose-Capillary: 131 mg/dL — ABNORMAL HIGH (ref 70–99)
Glucose-Capillary: 152 mg/dL — ABNORMAL HIGH (ref 70–99)
Glucose-Capillary: 94 mg/dL (ref 70–99)

## 2020-02-05 LAB — COMPREHENSIVE METABOLIC PANEL
ALT: 26 U/L (ref 0–44)
AST: 58 U/L — ABNORMAL HIGH (ref 15–41)
Albumin: 1.2 g/dL — ABNORMAL LOW (ref 3.5–5.0)
Alkaline Phosphatase: 660 U/L — ABNORMAL HIGH (ref 38–126)
Anion gap: 8 (ref 5–15)
BUN: 31 mg/dL — ABNORMAL HIGH (ref 6–20)
CO2: 18 mmol/L — ABNORMAL LOW (ref 22–32)
Calcium: 7.9 mg/dL — ABNORMAL LOW (ref 8.9–10.3)
Chloride: 115 mmol/L — ABNORMAL HIGH (ref 98–111)
Creatinine, Ser: 2.75 mg/dL — ABNORMAL HIGH (ref 0.44–1.00)
GFR calc Af Amer: 21 mL/min — ABNORMAL LOW (ref >60)
GFR calc non Af Amer: 18 mL/min — ABNORMAL LOW (ref >60)
Glucose, Bld: 133 mg/dL — ABNORMAL HIGH (ref 70–99)
Potassium: 4 mmol/L (ref 3.5–5.1)
Sodium: 141 mmol/L (ref 135–145)
Total Bilirubin: 0.5 mg/dL (ref 0.3–1.2)
Total Protein: 4.1 g/dL — ABNORMAL LOW (ref 6.5–8.1)

## 2020-02-05 LAB — ECHOCARDIOGRAM COMPLETE
Height: 69 in
Weight: 2680.79 oz

## 2020-02-05 LAB — CBC
HCT: 35.1 % — ABNORMAL LOW (ref 36.0–46.0)
Hemoglobin: 11 g/dL — ABNORMAL LOW (ref 12.0–15.0)
MCH: 33.3 pg (ref 26.0–34.0)
MCHC: 31.3 g/dL (ref 30.0–36.0)
MCV: 106.4 fL — ABNORMAL HIGH (ref 80.0–100.0)
Platelets: 239 10*3/uL (ref 150–400)
RBC: 3.3 MIL/uL — ABNORMAL LOW (ref 3.87–5.11)
RDW: 16.2 % — ABNORMAL HIGH (ref 11.5–15.5)
WBC: 4.3 10*3/uL (ref 4.0–10.5)
nRBC: 0 % (ref 0.0–0.2)

## 2020-02-05 LAB — TSH: TSH: 10.572 u[IU]/mL — ABNORMAL HIGH (ref 0.350–4.500)

## 2020-02-05 LAB — LACTIC ACID, PLASMA
Lactic Acid, Venous: 1.5 mmol/L (ref 0.5–1.9)
Lactic Acid, Venous: 1.1 mmol/L (ref 0.5–1.9)

## 2020-02-05 LAB — BRAIN NATRIURETIC PEPTIDE: B Natriuretic Peptide: 373.6 pg/mL — ABNORMAL HIGH (ref 0.0–100.0)

## 2020-02-05 LAB — C-REACTIVE PROTEIN: CRP: 4.2 mg/dL — ABNORMAL HIGH (ref <1.0)

## 2020-02-05 LAB — HEMOGLOBIN A1C
Hgb A1c MFr Bld: 5.2 % (ref 4.8–5.6)
Hgb A1c MFr Bld: 5.2 % (ref 4.8–5.6)
Mean Plasma Glucose: 102.54 mg/dL
Mean Plasma Glucose: 102.54 mg/dL

## 2020-02-05 LAB — PREALBUMIN: Prealbumin: 8.5 mg/dL — ABNORMAL LOW (ref 18–38)

## 2020-02-05 LAB — MAGNESIUM: Magnesium: 2.1 mg/dL (ref 1.7–2.4)

## 2020-02-05 LAB — HIV ANTIBODY (ROUTINE TESTING W REFLEX): HIV Screen 4th Generation wRfx: NONREACTIVE

## 2020-02-05 LAB — SEDIMENTATION RATE: Sed Rate: 20 mm/hr (ref 0–22)

## 2020-02-05 LAB — PHOSPHORUS: Phosphorus: 2.5 mg/dL (ref 2.5–4.6)

## 2020-02-05 MED ORDER — PRO-STAT SUGAR FREE PO LIQD
30.0000 mL | Freq: Two times a day (BID) | ORAL | Status: DC
  Administered 2020-02-05 – 2020-02-11 (×12): 30 mL via ORAL
  Filled 2020-02-05 (×12): qty 30

## 2020-02-05 MED ORDER — ADULT MULTIVITAMIN W/MINERALS CH
1.0000 | ORAL_TABLET | Freq: Every day | ORAL | Status: DC
  Administered 2020-02-05 – 2020-02-11 (×7): 1 via ORAL
  Filled 2020-02-05 (×7): qty 1

## 2020-02-05 MED ORDER — ALBUMIN HUMAN 5 % IV SOLN
25.0000 g | Freq: Once | INTRAVENOUS | Status: AC
  Administered 2020-02-05: 25 g via INTRAVENOUS
  Filled 2020-02-05: qty 500

## 2020-02-05 MED ORDER — LACTATED RINGERS IV BOLUS: 1000.0000 mL | Freq: Once | INTRAVENOUS | Status: DC

## 2020-02-05 MED ORDER — LACTATED RINGERS IV BOLUS
1000.0000 mL | INTRAVENOUS | Status: DC
  Administered 2020-02-05: 1000 mL via INTRAVENOUS

## 2020-02-05 MED ORDER — GLUCERNA SHAKE PO LIQD
237.0000 mL | Freq: Three times a day (TID) | ORAL | Status: DC
  Administered 2020-02-05 – 2020-02-11 (×16): 237 mL via ORAL

## 2020-02-05 MED ORDER — HYDROCORTISONE NA SUCCINATE PF 100 MG IJ SOLR
100.0000 mg | Freq: Three times a day (TID) | INTRAMUSCULAR | Status: AC
  Administered 2020-02-05 – 2020-02-06 (×6): 100 mg via INTRAVENOUS
  Filled 2020-02-05 (×6): qty 2

## 2020-02-05 MED ORDER — MIDODRINE HCL 5 MG PO TABS
10.0000 mg | ORAL_TABLET | Freq: Three times a day (TID) | ORAL | Status: DC
  Administered 2020-02-05 – 2020-02-07 (×7): 10 mg via ORAL
  Filled 2020-02-05 (×7): qty 2

## 2020-02-05 NOTE — Evaluation (Addendum)
Occupational Therapy Evaluation Patient Details Name: Wanda [name] MRN: [id number] DOB: 02/24/1961 Today's Date: 02/05/2020    History of Present Illness Pt is a 59 yo female presenting due to hypotension, weakness, and fatigue. Upon admission, pt found to have UTI causing hypotension and AKI. PMH includes: DM II, HTN, a-fib, HLD, ESRD with transplant 2020, microcytic anemia, and hx of gastric sleeve.   Clinical Impression   Pt PTA: Pt with very supportive family at home; reports assist with all ADL with minimal movement of LUE. Pt currently performing minA+2 for bed mobility and modA +2 for scooting in bed. Pt limited by decreased activity tolerance and decreased mobility. Pt appears at baseline for ADL tasks as she requires assist prior. Pt would greatly benefit from continued OT skilled services. OT following acutely.  Low BP at EOB 70/44 so pt required to return to supine      Follow Up Recommendations  Home health OT;Supervision/Assistance - 24 hour    Equipment Recommendations  Other (comment)(hoyer lift )    Recommendations for Other Services       Precautions / Restrictions Precautions Precautions: Fall Precaution Comments: Pt with WC use at baseline, wounds on bilateral heels, orthostatic Restrictions Weight Bearing Restrictions: No      Mobility Bed Mobility Overal bed mobility: Needs Assistance Bed Mobility: Rolling;Sidelying to Sit;Sit to Supine Rolling: Min assist Sidelying to sit: Min assist   Sit to supine: Min assist;+2 for physical assistance   General bed mobility comments: Pt able to come to sitting with minA for BLE movement to EOB, minA (through HHA not bed rail) for trunk elevation from elevated HOB, but need for added assist to safely reposition in bed due to onset of dizziness with seated position. BP from 110/64 to 70/44  Transfers Overall transfer level: Needs assistance Equipment used: None Transfers: Lateral/Scoot Transfers           Lateral/Scoot Transfers: Mod assist;+2 safety/equipment;+2 physical assistance General transfer comment: Pt able to scoot laterally in bed prior to return to supine with modA of 2 with use of bed pad.    Balance Overall balance assessment: Needs assistance Sitting-balance support: Bilateral upper extremity supported;Feet supported Sitting balance-Leahy Scale: Fair Sitting balance - Comments: BUE support on bed, minG for safety due to dizziness                                   ADL either performed or assessed with clinical judgement   ADL Overall ADL's : At baseline                                     Functional mobility during ADLs: Moderate assistance;+2 for physical assistance;+2 for safety/equipment General ADL Comments: Pt at baseline for ADL tasks at this time. Pt reports feeling slightly weaker than baseline.     Vision Baseline Vision/History: No visual deficits Patient Visual Report: No change from baseline Vision Assessment?: No apparent visual deficits     Perception     Praxis      Pertinent Vitals/Pain Pain Assessment: No/denies pain     Hand Dominance Right   Extremity/Trunk Assessment Upper Extremity Assessment Upper Extremity Assessment: Generalized weakness;LUE deficits/detail LUE Deficits / Details: LUE decreased ROM LUE Coordination: decreased fine motor;decreased gross motor   Lower Extremity Assessment Lower Extremity Assessment: Generalized weakness   Cervical /  Trunk Assessment Cervical / Trunk Assessment: Kyphotic   Communication Communication Communication: No difficulties   Cognition Arousal/Alertness: Awake/alert Behavior During Therapy: WFL for tasks assessed/performed Overall Cognitive Status: Within Functional Limits for tasks assessed                                     General Comments  BP dropped from 110/64 in supine to 70/44 with sitting EOB, returned to 114/70 with return to  supine.    Exercises     Shoulder Instructions      Home Living Family/patient expects to be discharged to:: Private residence Living Arrangements: Spouse/significant other Available Help at Discharge: Family Type of Home: House Home Access: Level entry     Home Layout: One level     Bathroom Shower/Tub: Teacher, early years/pre: [facility] bed;Wheelchair - Education administrator (comment);Hand held shower head(slideboard)   Additional Comments: pt has been using WC with slidebaord transfers with family assist at home, house is able to accomodate [facility]      Prior Functioning/Environment Level of Independence: Needs assistance  Gait / Transfers Assistance Needed: assist needed for slideboard placement/transfer or if family wants to do squat-pivot transfer it is maxA. Pt reports she does not stand and has used WC since december ADL's / Homemaking Assistance Needed: spouse and family complete   Comments: pt able to verbalize technique and ask for assist as needed        OT Problem List: Decreased strength;Decreased activity tolerance;Impaired UE functional use      OT Treatment/Interventions: Self-care/ADL training;Therapeutic exercise;Energy conservation;Therapeutic activities;Patient/family education;Balance training    OT Goals(Current goals can be found in the care plan section) Acute Rehab OT Goals Patient Stated Goal: return home OT Goal Formulation: With patient Time For Goal Achievement: 02/19/20 Potential to Achieve Goals: Good ADL Goals Pt/caregiver will Perform Home Exercise Program: Increased strength;Both right and left upper extremity;With minimal assist Additional ADL Goal #1: Pt will increase to min A for bed mobility to EOB. Additional ADL Goal #2: Pt will increase to modA for sliding board or lateral scoot transfer OOB with cues for proper technique.  OT Frequency: Min 2X/week   Barriers to D/C:            Co-evaluation  PT/OT/SLP Co-Evaluation/Treatment: Yes Reason for Co-Treatment: Complexity of the patient's impairments (multi-system involvement);To address functional/ADL transfers   OT goals addressed during session: ADL's and self-care      AM-PAC OT "6 Clicks" Daily Activity     Outcome Measure Help from another person eating meals?: None Help from another person taking care of personal grooming?: A Little Help from another person toileting, which includes using toliet, bedpan, or urinal?: A Lot Help from another person bathing (including washing, rinsing, drying)?: A Lot Help from another person to put on and taking off regular upper body clothing?: A Little Help from another person to put on and taking off regular lower body clothing?: A Lot 6 Click Score: 16   End of Session Equipment Utilized During Treatment: Gait belt Nurse Communication: Mobility status  Activity Tolerance: Patient tolerated treatment well Patient left: in bed;with call bell/phone within reach;with bed alarm set  OT Visit Diagnosis: Unsteadiness on feet (R26.81);Muscle weakness (generalized) (M62.81)                Time: 1610-9604 OT Time Calculation (min): 27 min Charges:  OT  General Charges $OT Visit: 1 Visit OT Evaluation $OT Eval Moderate Complexity: 1 Mod  Jefferey Pica, OTR/L Acute Rehabilitation Services Pager: [phone] Office: [phone]   Irl Bodie C 02/05/2020, 4:57 PM

## 2020-02-05 NOTE — Progress Notes (Signed)
[facility] Progress Note  Patient: Wanda [name]    NIO:[id number]  DOA: 02/04/2020     Date of Service: the patient was seen and examined on 02/05/2020  Chief Complaint  Patient presents with  . Hypotension   Brief hospital course: Wanda [name] is a 59 y.o. female with medical history significant of T2DM, HTN, Paroxysmal A. Fib, HLD, ESRD with failed kidney transplant 2013 and repeat transplant September 2020 at [facility], microcytic anemia, Hx of gastric sleeve who presents with dysuria and hypotension.  Currently further plan is aggressive resuscitation and antibiotics.  Assessment and Plan: 1.  Sepsis POA. Secondary to UTI. Monitor urine cultures. May require imaging if does not improve. Currently on IV antibiotics. Bactrim currently on hold.  2.  Hypotension Blood pressure soft. Lactic acid not elevated. Suspecting difficult to get a read given limited access and extremities. IV albumin as well as 1 L of IV left start bolus. Added midodrine. Monitor on telemetry.  Change to progressive.  3.  Anasarca. Hypoalbuminemia. Moderate pericardial effusion without any tamponade. Bilateral pleural effusion. Suspect this is all in the setting of third spacing from hypoalbuminemia. Currently echocardiogram does not show any evidence of tamponade. Monitor. Low threshold to consult cardiology.  If the patient becomes symptomatic with low blood pressure. Monitor  4.  ESRD SP renal transplant. Anemia chronic kidney disease. Renal function worsening. Nephrology consulted for Appreciate assistance. Currently CellCept is on hold given concern for bacteremia and viremia.  5.  Paroxysmal A. fib Eliquis currently receiving. Metoprolol on hold.  6.  Type 2 diabetes mellitus with nephropathy and neuropathy.  Continue gabapentin. Continue sliding scale insulin. Monitor.  7.  Moderate protein calorie malnutrition. Failure to thrive Body mass index is 24.74 kg/m.    Nutrition Problem: Moderate Malnutrition Etiology: chronic illness(ESRD s/p transplant) Interventions: Interventions: Glucerna shake, MVI, Prostat Diet:  Renal diet  DVT Prophylaxis: Therapeutic Anticoagulation with Eliquis   Advance goals of care discussion: Full code  Family Communication: no family was present at bedside, at the time of interview.   Disposition:  Status is: Inpatient  Remains inpatient appropriate because:IV treatments appropriate due to intensity of illness or inability to take PO and Inpatient level of care appropriate due to severity of illness   Dispo: The patient is from: Home              Anticipated d/c is to: SNF              Anticipated d/c date is: > 3 days              Patient currently is not medically stable to d/c.         Subjective: Blood pressure persistently low.  Patient remains asymptomatic.  Denies any acute complaint.  Reports left arm swelling secondary to IV infiltration last night.  No nausea no vomiting no fever no chills.  Not chest pain.  No diarrhea no constipation.  No abdominal pain.  Physical Exam: General:  alert oriented to time, place, and person.  Appear in mild distress, affect appropriate Eyes: PERRL ENT: Oral Mucosa Clear, moist  Neck: difficult to assess  JVD,  Cardiovascular: S1 and S2 Present, no Murmur,  Respiratory: good respiratory effort, Bilateral Air entry equal and Decreased, no Crackles, no wheezes Abdomen: Bowel Sound present, Soft and no tenderness,  Skin: no rash Extremities: bilateral upper extremity edema,no pedal edema, no calf tenderness Neurologic: without any new focal findings Gait not checked due to patient safety  concerns  Vitals:   02/05/20 1653 02/05/20 1814 02/05/20 1853 02/05/20 1919  BP: (!) 101/29 (!) 96/39 (!) 90/21 (!) 87/23  Pulse: 78 80 80 81  Resp:  16 16 18   Temp:  97.8 F (36.6 C) 97.6 F (36.4 C) 98.1 F (36.7 C)  TempSrc:  Oral Oral Oral  SpO2: 99% 100% 100% 99%   Weight:      Height:        Intake/Output Summary (Last 24 hours) at 02/05/2020 2028 Last data filed at 02/05/2020 1700 Gross per 24 hour  Intake 1460 ml  Output --  Net 1460 ml   Filed Weights   02/04/20 1238 02/04/20 2239 02/05/20 0507  Weight: 73.9 kg 76 kg 76 kg    Data Reviewed: I have personally reviewed and interpreted daily labs, tele strips, imagings as discussed above. I reviewed all nursing notes, pharmacy notes, vitals, pertinent old records I have discussed plan of care as described above with RN and patient/family.  CBC: Recent Labs  Lab 02/04/20 1255 02/05/20 0633  WBC 4.4 4.3  HGB 11.1* 11.0*  HCT 36.0 35.1*  MCV 108.1* 106.4*  PLT 237 944   Basic Metabolic Panel: Recent Labs  Lab 02/04/20 1315 02/05/20 0633  NA 139 141  K 4.0 4.0  CL 112* 115*  CO2 19* 18*  GLUCOSE 219* 133*  BUN 28* 31*  CREATININE 2.74* 2.75*  CALCIUM 8.6* 7.9*  MG  --  2.1  PHOS  --  2.5    Studies: DG Foot Complete Right  Result Date: 02/05/2020 CLINICAL DATA:  Follow-up right foot osteomyelitis. EXAM: RIGHT FOOT COMPLETE - 3+ VIEW COMPARISON:  None. FINDINGS: Examination demonstrates no evidence of osteomyelitis over the area of clinical concern of the hindfoot/calcaneus. No air in the soft tissues. Possible erosive change along the medial aspect of the head of the first proximal phalanx. Remainder the exam is unremarkable. IMPRESSION: 1. No evidence of osteomyelitis over the area of concern at the heel/calcaneus. 2. Possible focal erosive change at the first interphalangeal joint which may be due to inflammatory arthropathy. Electronically Signed   By: Marin Olp M.D.   On: 02/05/2020 10:23   ECHOCARDIOGRAM COMPLETE  Result Date: 02/05/2020    ECHOCARDIOGRAM REPORT   Patient Name:   Wanda [name] Date of Exam: 02/05/2020 Medical Rec #:  [id number]         Height:       69.0 in Accession #:    [id number]        Weight:       167.5 lb Date of Birth:  05-20-1961        BSA:           1.916 m Patient Age:    59 years          BP:           71/25 mmHg Patient Gender: F                 HR:           85 bpm. Exam Location:  Inpatient Procedure: 2D Echo, Cardiac Doppler and Color Doppler Indications:    Pericardial effusion  History:        Patient has prior history of Echocardiogram examinations, most                 recent 01/09/2013. Pericardial Disease; Risk Factors:Diabetes,  Hypertension and Obesity. Kidney transplant.  Sonographer:    Dustin Flock Referring Phys: [id number] [place]  1. Left ventricular ejection fraction, by estimation, is 70 to 75%. The left ventricle has hyperdynamic function. The left ventricle has no regional wall motion abnormalities. There is moderate left ventricular hypertrophy. Left ventricular diastolic parameters are consistent with Grade I diastolic dysfunction (impaired relaxation). Elevated left atrial pressure.  2. Right ventricular systolic function is normal. The right ventricular size is normal. There is mildly elevated pulmonary artery systolic pressure.  3. Left atrial size was mildly dilated.  4. Moderate pericardial effusion. The pericardial effusion is circumferential. There is no evidence of cardiac tamponade.  5. The mitral valve is normal in structure. No evidence of mitral valve regurgitation. No evidence of mitral stenosis.  6. The aortic valve is tricuspid. Aortic valve regurgitation is not visualized. Mild aortic valve sclerosis is present, with no evidence of aortic valve stenosis.  7. The inferior vena cava is normal in size with greater than 50% respiratory variability, suggesting right atrial pressure of 3 mmHg. FINDINGS  Left Ventricle: Left ventricular ejection fraction, by estimation, is 70 to 75%. The left ventricle has hyperdynamic function. The left ventricle has no regional wall motion abnormalities. The left ventricular internal cavity size was normal in size. There is moderate left  ventricular hypertrophy. Left ventricular diastolic parameters are consistent with Grade I diastolic dysfunction (impaired relaxation). Elevated left atrial pressure. Right Ventricle: The right ventricular size is normal. No increase in right ventricular wall thickness. Right ventricular systolic function is normal. There is mildly elevated pulmonary artery systolic pressure. The tricuspid regurgitant velocity is 2.80  m/s, and with an assumed right atrial pressure of 8 mmHg, the estimated right ventricular systolic pressure is 95.6 mmHg. Left Atrium: Left atrial size was mildly dilated. Right Atrium: Right atrial size was normal in size. Pericardium: A moderately sized pericardial effusion is present. The pericardial effusion is circumferential. There is no evidence of cardiac tamponade. Mitral Valve: The mitral valve is normal in structure. Normal mobility of the mitral valve leaflets. No evidence of mitral valve regurgitation. No evidence of mitral valve stenosis. Tricuspid Valve: The tricuspid valve is normal in structure. Tricuspid valve regurgitation is trivial. No evidence of tricuspid stenosis. Aortic Valve: The aortic valve is tricuspid. Aortic valve regurgitation is not visualized. Mild aortic valve sclerosis is present, with no evidence of aortic valve stenosis. Pulmonic Valve: The pulmonic valve was normal in structure. Pulmonic valve regurgitation is not visualized. No evidence of pulmonic stenosis. Aorta: The aortic root is normal in size and structure. Venous: The inferior vena cava is normal in size with greater than 50% respiratory variability, suggesting right atrial pressure of 3 mmHg. IAS/Shunts: No atrial level shunt detected by color flow Doppler.  LEFT VENTRICLE PLAX 2D LVIDd:         3.04 cm  Diastology LVIDs:         2.23 cm  LV e' lateral:   4.46 cm/s LV PW:         1.39 cm  LV E/e' lateral: 19.8 LV IVS:        1.46 cm  LV e' medial:    3.81 cm/s LVOT diam:     2.10 cm  LV E/e' medial:   23.1 LV SV:         75 LV SV Index:   39 LVOT Area:     3.46 cm  RIGHT VENTRICLE RV Basal diam:  2.39 cm RV S  prime:     13.80 cm/s TAPSE (M-mode): 2.2 cm LEFT ATRIUM             Index       RIGHT ATRIUM          Index LA diam:        3.40 cm 1.77 cm/m  RA Area:     9.40 cm LA Vol (A2C):   68.4 ml 35.69 ml/m RA Volume:   18.80 ml 9.81 ml/m LA Vol (A4C):   54.4 ml 28.39 ml/m LA Biplane Vol: 66.0 ml 34.44 ml/m  AORTIC VALVE LVOT Vmax:   117.00 cm/s LVOT Vmean:  80.500 cm/s LVOT VTI:    0.217 m  AORTA Ao Root diam: 3.00 cm MITRAL VALVE                TRICUSPID VALVE MV Area (PHT): 4.36 cm     TR Peak grad:   31.4 mmHg MV Decel Time: 174 msec     TR Vmax:        280.00 cm/s MV E velocity: 88.10 cm/s MV A velocity: 143.00 cm/s  SHUNTS MV E/A ratio:  0.62         Systemic VTI:  0.22 m                             Systemic Diam: 2.10 cm Candee Furbish MD Electronically signed by Candee Furbish MD Signature Date/Time: 02/05/2020/2:58:41 PM    Final     Scheduled Meds: . allopurinol  100 mg Oral Daily  . apixaban  5 mg Oral Q12H  . atorvastatin  20 mg Oral QHS  . calcitRIOL  0.25 mcg Oral Daily  . cholecalciferol  1,000 Units Oral Daily  . feeding supplement (GLUCERNA SHAKE)  237 mL Oral TID BM  . feeding supplement (PRO-STAT SUGAR FREE 64)  30 mL Oral BID  . hydrocortisone sod succinate (SOLU-CORTEF) inj  100 mg Intravenous Q8H  . insulin aspart  0-5 Units Subcutaneous QHS  . insulin aspart  0-9 Units Subcutaneous TID WC  . midodrine  10 mg Oral TID WC  . multivitamin with minerals  1 tablet Oral Daily  . nortriptyline  25 mg Oral QHS  . pantoprazole  40 mg Oral Daily  . phosphorus  1 tablet Oral 3 times per day  . tacrolimus  1.5 mg Oral BID   Continuous Infusions: . cefTRIAXone (ROCEPHIN)  IV 1 g (02/05/20 1228)  . lactated ringers     PRN Meds: acetaminophen, famotidine, HYDROmorphone, ondansetron **OR** ondansetron (ZOFRAN) IV, senna-docusate  Time spent: 35 minutes  Author: Berle Mull,  MD [facility] 02/05/2020 8:28 PM  To reach On-call, see care teams to locate the attending and reach out to them via [email or web address]. If 7PM-7AM, please contact night-coverage If you still have difficulty reaching the attending provider, please page the [facility] (Director on Call) for [facility] on amion for assistance.

## 2020-02-05 NOTE — Plan of Care (Signed)
  Problem: Pain Managment: Goal: General experience of comfort will improve Outcome: Progressing   Problem: Safety: Goal: Ability to remain free from injury will improve Outcome: Progressing   

## 2020-02-05 NOTE — Progress Notes (Addendum)
Initial Nutrition Assessment  DOCUMENTATION CODES:   Non-severe (moderate) malnutrition in context of chronic illness  INTERVENTION:   -30 ml Prostat BID, each supplement provides 100 kcals and 15 grams protein -Glucerna Shake po TID, each supplement provides 220 kcal and 10 grams of protein -MVI with minerals daily  NUTRITION DIAGNOSIS:   Moderate Malnutrition related to chronic illness(ESRD s/p transplant) as evidenced by moderate fat depletion, moderate muscle depletion, energy intake < or equal to 75% for > or equal to 1 month, percent weight loss.  GOAL:   Patient will meet greater than or equal to 90% of their needs  MONITOR:   PO intake, Supplement acceptance, Labs, Weight trends, Skin, I & O's  REASON FOR ASSESSMENT:   Consult Wound healing  ASSESSMENT:   Wanda [name] is a 59 y.o. female with medical history significant of T2DM, HTN, HLD, Hypothyroidism, ESRD with failed kidney transplant 2013 and repeat transplant September 2020 at [facility], microcytic anemia, Hx of gastric sleeve who presents with dysuria and hypotension concerning for UTI, but also with evidence of volume overload.  Pt admitted with UTI, AKI, volume overload, and bilateral pleural effusions.   Reviewed I/O's: +1.6 L x 24 hours  Reviewed CWOCN note on 02/05/20; pt with unstageable wound to rt heel, ruptured bulla on lt medial great toe, dry ulcerated area on lt fifth toe, and scattered dried crusts on lt posterior heel.   Case discussed with nurse tech, who reports that pt has eaten minimally due to multiple tests and procedures. Pt consumed a few bites of spaghetti.   Spoke with pt at bedside, who reports fair appetite. She shares that she has been consuming mostly soft textured foods at home PTA secondary to missing teeth. She consumes 3 meals per day PTA (Breakfast: scrambled eggs and yogurt, lunch: chili beans, dinner: meat and vegetable). She supplements her meals with Glucerna supplements. She  explains that she underwent gastric sleeve procedure in 2015 and often consumes small frequent meals, as she can not tolerate large portions of food. Offered to mechanically downgrade diet, however, pt prefers to choose menu items that she thinks she can tolerate.   Pt reports that she underwent a kidney transplant in [place] in September 2020. She feels much better after surgery and has discontinued with HD. Pt reports she lost "a lot" of weight after her kidney transplant and as she did not like to hospital food. She suspects she has regain some weight since returning back to [place], but is unsure. She has also been wheelchair bound since December 2020. Per wt hx, pt has experienced a 22% wt loss over the past year.  Discussed importance of good meal and supplement intake to promote healing. Visit cut short secondary to pt being transferred for procedure.   Medications reviewed and include IV solu-medrol.  Lab Results  Component Value Date   HGBA1C 5.2 02/05/2020   PTA DM medications are 12 units inuslin aspart TID with meals.   Labs reviewed: CBGS: 128-152 (inpatient orders for glycemic control are 0-5 units insulin aspart q HS and 0-9 units insulin aspart TID with meals).   NUTRITION - FOCUSED PHYSICAL EXAM:    Most Recent Value  Orbital Region  Mild depletion  Upper Arm Region  Moderate depletion  Thoracic and Lumbar Region  Mild depletion  Buccal Region  Mild depletion  Temple Region  Mild depletion  Clavicle Bone Region  Moderate depletion  Clavicle and Acromion Bone Region  Moderate depletion  Scapular Bone Region  Moderate depletion  Dorsal Hand  Moderate depletion  Patellar Region  Moderate depletion  Anterior Thigh Region  Moderate depletion  Posterior Calf Region  Moderate depletion  Edema (RD Assessment)  Mild  Hair  Reviewed  Eyes  Reviewed  Mouth  Reviewed  Skin  Reviewed  Nails  Reviewed       Diet Order:   Diet Order            Diet regular Room service appropriate?  Yes; Fluid consistency: Thin  Diet effective now              EDUCATION NEEDS:   Education needs have been addressed  Skin:  Skin Assessment: Skin Integrity Issues: Skin Integrity Issues:: Unstageable, Other (Comment) Unstageable: rt heel Other: ruptured bulla on lt medial great toe, dry ulcerated area on lt fifth toe, and scattered dried crusts on lt posterior heel  Last BM:  02/03/20  Height:   Ht Readings from Last 1 Encounters:  02/04/20 5\' 9"  (1.753 m)    Weight:   Wt Readings from Last 1 Encounters:  02/05/20 76 kg    Ideal Body Weight:  65.9 kg  BMI:  Body mass index is 24.74 kg/m.  Estimated Nutritional Needs:   Kcal:  2100-2300  Protein:  115-130 grams  Fluid:  > 2.1 L    Loistine Chance, RD, LDN, [place] Registered Dietitian II Certified Diabetes Care and Education Specialist Please refer to [facility] for RD and/or RD on-call/weekend/after hours pager

## 2020-02-05 NOTE — Progress Notes (Signed)
  Echocardiogram 2D Echocardiogram has been performed.  Wanda [name] 02/05/2020, 2:53 PM

## 2020-02-05 NOTE — Consult Note (Signed)
Reason for Consult: AKI/CKD stage 3a Referring Physician:  Posey Pronto, MD  Wanda [name] is an 59 y.o. female with a PMH significant for IDDM, HTN, and ESRD s/p second DDKT at [facility] on 06/22/19.  Her post transplant course was complicated by delayed graft function and did require dialysis until November 2020.  Over the past year she has had 4 hospitalizations for UTI's and bacteremia.  She was seen in our office yesterday and Dr. Posey Pronto noted that she was hypotensive and drowsy.  She reported that she had been a recent hospitalization for HCAP complicated by A fib with RVR and was in SNF for deconditioning.  Since discharge she has been having N/V/D, dizziness, weakness, and malaise.  She was instructed to go directly to the [facility] ED and was subsequently admitted for urosepsis and volume depletion.  We were consulted due to the development of AKI/CKD stage IIIa.  The trend in Scr is seen below.  Of note, she was instructed to stop cellcept by her transplant clinic at [facility] due to what sounds like BK viremia.  She denies any fevers, chills, flank/abdominal pain.  She did have some suprapubic tenderness and dysuria and was started on IV ceftriaxone.   Trend in Creatinine: Creatinine, Ser  Date/Time Value Ref Range Status  02/05/2020 06:33 AM 2.75 (H) 0.44 - 1.00 mg/dL Final  02/04/2020 01:15 PM 2.74 (H) 0.44 - 1.00 mg/dL Final  12/15/2019 11:53 PM 2.20 (H) 0.44 - 1.00 mg/dL Final  12/15/2019 09:12 PM 1.59 (H) 0.44 - 1.00 mg/dL Final  10/16/2017 07:10 PM 5.98 (H) 0.44 - 1.00 mg/dL Final  10/11/2016 08:40 AM 6.98 (H) 0.44 - 1.00 mg/dL Final  10/09/2016 10:30 AM 5.46 (H) 0.44 - 1.00 mg/dL Final  10/08/2016 02:20 AM 7.78 (H) 0.44 - 1.00 mg/dL Final  10/07/2016 05:18 PM 8.08 (H) 0.44 - 1.00 mg/dL Final  07/31/2016 12:49 PM 6.70 (H) 0.44 - 1.00 mg/dL Final  06/03/2015 08:58 PM 5.69 (H) 0.44 - 1.00 mg/dL Final  05/27/2015 12:57 PM 11.68 (H) 0.44 - 1.00 mg/dL Final  05/27/2015 05:40 AM 10.99 (H)  0.44 - 1.00 mg/dL Final  05/26/2015 07:05 PM 10.08 (H) 0.44 - 1.00 mg/dL Final  05/26/2015 11:27 AM 9.61 (H) 0.44 - 1.00 mg/dL Final  03/30/2015 05:36 PM 6.73 (H) 0.44 - 1.00 mg/dL Final  05/28/2014 08:16 PM 5.01 (H) 0.50 - 1.10 mg/dL Final  05/17/2014 09:07 PM 9.30 (H) 0.50 - 1.10 mg/dL Final  12/22/2013 04:50 PM 6.58 (H) 0.50 - 1.10 mg/dL Final  07/26/2013 05:28 AM 5.88 (H) 0.50 - 1.10 mg/dL Final  07/25/2013 07:59 AM 5.99 (H) 0.50 - 1.10 mg/dL Final  07/24/2013 09:01 PM 6.10 (H) 0.50 - 1.10 mg/dL Final  07/24/2013 12:15 PM 6.18 (H) 0.50 - 1.10 mg/dL Final  04/21/2013 02:57 PM 1.84 (H) 0.50 - 1.10 mg/dL Final  01/11/2013 05:30 AM 3.63 (H) 0.50 - 1.10 mg/dL Final  01/10/2013 07:10 AM 3.58 (H) 0.50 - 1.10 mg/dL Final  01/09/2013 08:40 AM 2.36 (H) 0.50 - 1.10 mg/dL Final  01/09/2013 06:05 AM 2.51 (H) 0.50 - 1.10 mg/dL Final  01/08/2013 04:45 PM 2.57 (H) 0.50 - 1.10 mg/dL Final  11/11/2012 03:00 PM 2.12 (H) 0.50 - 1.10 mg/dL Final  10/31/2012 07:55 PM 2.37 (H) 0.50 - 1.10 mg/dL Final  09/07/2012 09:00 AM 1.81 (H) 0.50 - 1.10 mg/dL Final  09/06/2012 08:30 AM 1.50 (H) 0.50 - 1.10 mg/dL Final  09/05/2012 06:57 AM 1.55 (H) 0.50 - 1.10 mg/dL Final  09/04/2012 08:35 PM  1.77 (H) 0.50 - 1.10 mg/dL Final  09/04/2012 03:20 PM 1.75 (H) 0.50 - 1.10 mg/dL Final  07/17/2012 06:44 PM 1.50 (H) 0.50 - 1.10 mg/dL Final  02/07/2012 08:52 AM 4.72 (H) 0.50 - 1.10 mg/dL Final  04/07/2011 11:08 AM 3.27 (H) 0.50 - 1.10 mg/dL Final  08/03/2010 11:19 AM 3.11 (H) 0.40 - 1.20 mg/dL Final  11/28/2009 06:35 AM 2.93 (H) 0.4 - 1.2 mg/dL Final  11/27/2009 04:25 AM 2.97 (H) 0.4 - 1.2 mg/dL Final  09/22/2009 12:00 PM 3.02 (H) 0.4 - 1.2 mg/dL Final  09/18/2009 09:31 AM 2.51 (H) 0.4 - 1.2 mg/dL Final  09/01/2007 05:15 PM 2.53 (H)  Final    PMH:   Past Medical History:  Diagnosis Date  . Anemia   . Arthritis    "hands" (07/24/2013) pt denies  . Chronic kidney disease, stage IV (severe) ([place])    "never went on  dialysis" (07/24/2013)  . Diabetes mellitus type II   . Diabetic peripheral neuropathy ([place])   . Dialysis patient [facility])    Monday Wednesday Friday   . Dyspnea on exertion    pt denies  . Foot pain   . GERD (gastroesophageal reflux disease)   . Gout   . History of chicken pox   . History of measles   . History of mumps   . Hyperlipidemia   . Hypothyroidism   . Migraines    "q 3 months" (07/24/2013)  . Nonspecific abnormal unspecified cardiovascular function study   . Obesity   . PAD (peripheral artery disease) ([place])   . Peripheral neuropathy   . Peripheral vascular disease ([place])   . Pneumonia 09/2012  . Renal transplant disorder   . Shingles   . Sleep apnea    "getting ready to get tested again cause dr says I have it" (07/24/2013) pt states was tested and was told she did not have sleep apnea / no cpap  . Unspecified essential hypertension     PSH:   Past Surgical History:  Procedure Laterality Date  . AV FISTULA PLACEMENT Right 2000   "lower arm" (07/24/2013)  . AV FISTULA PLACEMENT, RADIOCEPHALIC Right 1478   "upper arm" (07/24/2013)  . CAPD REMOVAL N/A 10/10/2016   Procedure: CONTINUOUS AMBULATORY PERITONEAL DIALYSIS  (CAPD) CATHETER REMOVAL;  Surgeon: Donnie Mesa, MD;  Location: [place];  Service: General;  Laterality: N/A;  . CATARACT EXTRACTION W/ INTRAOCULAR LENS  IMPLANT, BILATERAL Bilateral 2011  . [place]; 1987  . CHOLECYSTECTOMY  2011  . COLONOSCOPY    . ENDOMETRIAL CRYOABLATION  1980's  . INTRAUTERINE DEVICE (IUD) INSERTION  2011  . LAPAROSCOPIC GASTRIC SLEEVE RESECTION N/A 05/26/2015   Procedure: LAPAROSCOPIC GASTRIC SLEEVE RESECTION;  Surgeon: Excell Seltzer, MD;  Location: [facility];  Service: General;  Laterality: N/A;  . TRANSPLANTATION RENAL  2013   "right" (09/04/2012)  . TUBAL LIGATION  1987  . [address]   "RLE; stripped" (09/04/2012)  . VIDEO BRONCHOSCOPY  09/07/2012   Procedure: VIDEO BRONCHOSCOPY WITHOUT FLUORO;   Surgeon: Chesley Mires, MD;  Location: [facility] ENDOSCOPY;  Service: Cardiopulmonary;  Laterality: Bilateral;    Allergies:  Allergies  Allergen Reactions  . Hydrocodone-Acetaminophen Other (See Comments)    Severe headache  . Hydrocodone-Acetaminophen     Other reaction(s): headache, Other (See Comments), Unknown Severe headache   . Propoxyphene N-Acetaminophen Other (See Comments)    Severe headache  . Astemizole     Other reaction(s): headache, Other (See Comments), Unknown Rash   .  Cephalosporins Rash    Other reaction(s): Other (See Comments), Unknown Rash     Medications:   Prior to Admission medications   Medication Sig Start Date End Date Taking? Authorizing Provider  acetaminophen (TYLENOL) 325 MG tablet Take 650 mg by mouth every 6 (six) hours as needed for mild pain.   Yes [provider]  allopurinol (ZYLOPRIM) 100 MG tablet Take 2 tablets (200 mg total) by mouth daily. 12/02/17  Yes Neese, [place] M, NP  amiodarone (PACERONE) 200 MG tablet Take 200 mg by mouth daily.   Yes [provider]  amLODipine (NORVASC) 5 MG tablet Take 10 mg by mouth every 12 (twelve) hours.    Yes [provider]  apixaban (ELIQUIS) 5 MG TABS tablet Take 5 mg by mouth every 12 (twelve) hours.   Yes [provider]  atorvastatin (LIPITOR) 20 MG tablet Take 20 mg by mouth at bedtime.   Yes [provider]  calcitRIOL (ROCALTROL) 0.25 MCG capsule Take by mouth daily. 12/29/17  Yes [provider]  Cholecalciferol (VITAMIN D-3) 25 MCG (1000 UT) CAPS Take 1,000 Units by mouth daily.   Yes [provider]  colchicine (COLCRYS) 0.6 MG tablet Take 0.6 mg by mouth daily as needed (gout).  12/30/15  Yes [provider]  doxycycline (VIBRA-TABS) 100 MG tablet Take 100 mg by mouth daily.   Yes [provider]  famotidine (PEPCID) 20 MG tablet Take 20 mg by mouth daily as needed for heartburn. 06/30/18  Yes [provider]   fludrocortisone (FLORINEF) 0.1 MG tablet Take 0.1 mg by mouth daily.   Yes [provider]  folic acid (FOLVITE) 1 MG tablet Take 1 mg by mouth daily.   Yes [provider]  furosemide (LASIX) 40 MG tablet Take 40 mg by mouth.   Yes [provider]  hydrOXYzine (ATARAX/VISTARIL) 10 MG tablet Take 10 mg by mouth 2 (two) times daily as needed for itching. 04/19/18  Yes [provider]  K Phos Mono-Sod Phos Di & Mono (PHOSPHA 250 NEUTRAL) 155-852-130 MG TABS Take 1 tablet by mouth in the morning, at noon, and at bedtime.   Yes [provider]  L-Methylfolate-Algae-B12-B6 [place]) 3-90.314-2-35 MG CAPS Take 1 tablet by mouth daily. 01/18/17  Yes [provider]  loperamide (IMODIUM) 2 MG capsule Take 2 mg by mouth every 6 (six) hours as needed for diarrhea or loose stools.   Yes [provider]  metoprolol tartrate (LOPRESSOR) 50 MG tablet Take 50 mg by mouth 2 (two) times daily.   Yes [provider]  midodrine (PROAMATINE) 10 MG tablet Take 5 mg by mouth See admin instructions. Take 1 tablet 30 minutes prior to dialysis on Mon,Wed,Fri. May repeat mid Tues,Thurs if needed 01/20/18  Yes [provider]  Multiple Vitamin (MULTIVITAMIN WITH MINERALS) TABS tablet Take 1 tablet by mouth daily.   Yes [provider]  mycophenolate (CELLCEPT) 500 MG tablet Take 250 mg by mouth every 12 (twelve) hours.  07/17/19  Yes [provider]  nortriptyline (PAMELOR) 25 MG capsule TAKE 1 CAPSULE (25 MG TOTAL) BY MOUTH AT BEDTIME. 04/01/19  Yes Patel, Domenick Bookbinder, MD  ondansetron (ZOFRAN) 4 MG tablet Take 4 mg by mouth daily.   Yes [provider]  pantoprazole (PROTONIX) 40 MG tablet Take 40 mg by mouth daily. 01/16/18  Yes [provider]  predniSONE (DELTASONE) 5 MG tablet Take 5 mg by mouth daily with breakfast.   Yes [provider]  pyridOXINE (B-6) 50 MG tablet Take 50 mg by mouth daily.   Yes  [provider]  ramelteon (ROZEREM) 8 MG tablet Take 8 mg by mouth at bedtime.   Yes [provider]  tacrolimus (PROGRAF) 0.5 MG capsule Take 1.5 mg by mouth every 12 (twelve) hours.   Yes [provider]  thiamine 100 MG tablet Take 100 mg by mouth daily.   Yes [provider]  traZODone (DESYREL) 100 MG tablet Take 100 mg by mouth at bedtime as needed for sleep.  06/13/18  Yes [provider]  valGANciclovir (VALCYTE) 450 MG tablet Take 450 mg by mouth daily.   Yes [provider]  Vitamin D, Ergocalciferol, (DRISDOL) 50000 units CAPS capsule TAKE 1 CAPSULE (50,000 UNITS TOTAL) BY MOUTH EVERY 7 (SEVEN) DAYS. 03/29/18  Yes Jamse Arn, MD  calcium gluconate 500 MG tablet Take 1 tablet (500 mg total) by mouth 3 (three) times daily for 21 days. 12/16/19 01/06/20  Fatima Blank, MD  insulin aspart (NOVOLOG FLEXPEN) 100 UNIT/ML FlexPen Inject 12 Units into the skin 3 (three) times daily with meals. And pen needles 3/day Patient not taking: Reported on 02/04/2020 02/22/18   Renato Shin, MD    Inpatient medications: . allopurinol  100 mg Oral Daily  . apixaban  5 mg Oral Q12H  . atorvastatin  20 mg Oral QHS  . calcitRIOL  0.25 mcg Oral Daily  . cholecalciferol  1,000 Units Oral Daily  . hydrocortisone sod succinate (SOLU-CORTEF) inj  100 mg Intravenous Q8H  . insulin aspart  0-5 Units Subcutaneous QHS  . insulin aspart  0-9 Units Subcutaneous TID WC  . nortriptyline  25 mg Oral QHS  . pantoprazole  40 mg Oral Daily  . phosphorus  1 tablet Oral 3 times per day  . tacrolimus  1.5 mg Oral BID    Discontinued Meds:   Medications Discontinued During This Encounter  Medication Reason  . gabapentin (NEURONTIN) 100 MG capsule Change in therapy  . tacrolimus (PROGRAF) 1 MG capsule Dose change  . predniSONE (DELTASONE) 5 MG tablet Duplicate  . sulfamethoxazole-trimethoprim (BACTRIM) 400-80 MG tablet Patient Preference  . sodium  bicarbonate 650 MG tablet Patient Preference  . rOPINIRole (REQUIP) 0.25 MG tablet Patient Preference  . sevelamer carbonate (RENVELA) 2.4 g PACK Patient Preference  . pregabalin (LYRICA) 50 MG capsule Patient Preference  . indomethacin (INDOCIN) 25 MG capsule Patient Preference  . clonazePAM (KLONOPIN) 1 MG tablet Patient Preference  . sevelamer carbonate (RENVELA) powder PACK 2.4 g Patient has not taken in last 30 days  . sodium bicarbonate tablet 1,300 mg Patient has not taken in last 30 days  . tacrolimus (PROGRAF) capsule 4 mg Dose change  . gabapentin (NEURONTIN) capsule 100 mg Patient has not taken in last 30 days  . rOPINIRole (REQUIP) tablet 0.25 mg Patient has not taken in last 30 days  . metoprolol tartrate (LOPRESSOR) tablet 50 mg   . predniSONE (DELTASONE) tablet 5 mg   . mycophenolate (CELLCEPT) capsule 1,000 mg     Social History:  reports that she has never smoked. She has never used smokeless tobacco. She reports that she does not drink alcohol or use drugs.  Family History:   Family History  Problem Relation Age of Onset  . Colon cancer Father        deceased at age 6  . Diabetes Brother   . Neuropathy Neg Hx     Pertinent items are noted in HPI.  Weight change:   Intake/Output Summary (Last 24 hours) at 02/05/2020 1048 Last data filed at 02/05/2020 0100 Gross per 24 hour  Intake 1590 ml  Output --  Net 1590 ml   BP 110/64 (BP Location: Left Arm)   Pulse 77   Temp 97.7 F (36.5 C) (Oral)   Resp 16   Ht 5\' 9"  (1.753 m)   Wt 76 kg   SpO2 100%   BMI 24.74 kg/m  Vitals:   02/05/20 0108 02/05/20 0507 02/05/20 0731 02/05/20 0854  BP: (!) 97/56 (!) 81/47 (!) 87/62 110/64  Pulse: 80 80 80 77  Resp:  18 20 16   Temp:  97.8 F (36.6 C) 97.9 F (36.6 C) 97.7 F (36.5 C)  TempSrc:  Oral Oral Oral  SpO2:  98% 98% 100%  Weight:  76 kg    Height:         General appearance: fatigued and no distress Head: Normocephalic, without obvious abnormality,  atraumatic Eyes: negative findings: lids and lashes normal, conjunctivae and sclerae normal and corneas clear Resp: clear to auscultation bilaterally Cardio: no rub GI: soft, non-tender; bowel sounds normal; no masses,  no organomegaly and renal allograft in RLQ, nontender, no bruits Extremities: no edema, RUE AVF +T/B, right heel decubitus ulcer  Labs: Basic Metabolic Panel: Recent Labs  Lab 02/04/20 1315 02/05/20 0633  NA 139 141  K 4.0 4.0  CL 112* 115*  CO2 19* 18*  GLUCOSE 219* 133*  BUN 28* 31*  CREATININE 2.74* 2.75*  ALBUMIN 1.6* 1.2*  CALCIUM 8.6* 7.9*  PHOS  --  2.5   Liver Function Tests: Recent Labs  Lab 02/04/20 1315 02/05/20 0633  AST 24 58*  ALT 19 26  ALKPHOS 484* 660*  BILITOT 0.9 0.5  PROT 5.2* 4.1*  ALBUMIN 1.6* 1.2*   No results for input(s): LIPASE, AMYLASE in the last 168 hours. No results for input(s): AMMONIA in the last 168 hours. CBC: Recent Labs  Lab 02/04/20 1255 02/05/20 0633  WBC 4.4 4.3  HGB 11.1* 11.0*  HCT 36.0 35.1*  MCV 108.1* 106.4*  PLT 237 239   PT/INR: @LABRCNTIP (inr:5) Cardiac Enzymes: )No results for input(s): CKTOTAL, CKMB, CKMBINDEX, TROPONINI in the last 168 hours. CBG: Recent Labs  Lab 02/05/20 0045 02/05/20 0608  GLUCAP 152* 128*    Iron Studies: No results for input(s): IRON, TIBC, TRANSFERRIN, FERRITIN in the last 168 hours.  Xrays/Other Studies: CT ABDOMEN PELVIS WO CONTRAST  Result Date: 02/04/2020 CLINICAL DATA:  Hypotension, renal transplant EXAM: CT ABDOMEN AND PELVIS WITHOUT CONTRAST TECHNIQUE: Multidetector CT imaging of the abdomen and pelvis was performed following the standard protocol without IV contrast. COMPARISON:  07/31/2016, 02/04/2020 FINDINGS: Lower chest: There is a moderate pericardial effusion. Calcification of the mitral annulus is noted. The heart is not enlarged. There are bilateral pleural effusions, left greater than right. Compressive atelectasis left lower lobe. Hepatobiliary:  No focal liver abnormality is seen. Status post cholecystectomy. No biliary dilatation. Pancreas: Unremarkable. No pancreatic ductal dilatation or surrounding inflammatory changes. Spleen: Indeterminate wedge-shaped hypodensity within the posteromedial aspect of the spleen measures up to 3.7 cm. This is nonspecific, with limited evaluation on unenhanced study. The remainder of the spleen is unremarkable. Adrenals/Urinary Tract: Native kidneys are markedly atrophic. Left renal cysts are again noted, minimally complex, and decreased in size since prior study. The adrenals are unremarkable. There is atrophied and calcified failed right lower quadrant transplant kidney. Left lower quadrant transplant kidney is grossly unremarkable in appearance with no  evidence of hydronephrosis. The bladder is minimally distended with no gross abnormalities. Stomach/Bowel: No bowel obstruction or ileus. No bowel wall thickening or inflammatory change. Normal appendix right lower quadrant. Vascular/Lymphatic: Extensive atherosclerosis is seen throughout the abdominal vasculature. No pathologic adenopathy. Reproductive: Uterus and bilateral adnexa are unremarkable. Other: There is trace free fluid within the left upper quadrant and pelvis. Extensive subcutaneous edema throughout the abdominal wall and subcutaneous tissues compatible with anasarca. No free intraperitoneal gas. No abdominal wall hernia. Musculoskeletal: No acute or destructive bony lesions. Reconstructed images demonstrate no additional findings. IMPRESSION: 1. Moderate pericardial effusion. 2. Bilateral pleural effusions, left greater than right, with compressive atelectasis of the left lower lobe. 3. Indeterminate wedge-shaped hypodensity within the posteromedial aspect of the spleen measures up to 3.7 cm. This is nonspecific on an unenhanced exam, but does appear new since prior study. 4. Trace ascites. 5. Anasarca. 6. Left lower quadrant transplant kidney as above.  Electronically Signed   By: Randa Ngo M.D.   On: 02/04/2020 17:49   US Renal Transplant w/Doppler  Result Date: 02/04/2020 CLINICAL DATA:  Hypotension EXAM: ULTRASOUND OF RENAL TRANSPLANT WITH RENAL DOPPLER ULTRASOUND TECHNIQUE: Ultrasound examination of the renal transplant was performed with gray-scale, color and duplex doppler evaluation. COMPARISON:  07/31/2016 FINDINGS: Transplant kidney location: RLQ Transplant Kidney: Renal measurements: 11.8 x 5.4 by 5.5 cm = volume: 182mL. Normal in size and parenchymal echogenicity. No evidence of mass or hydronephrosis. No peri-transplant fluid collection seen. Color flow in the main renal artery:  Yes Color flow in the main renal vein:  Yes Duplex Doppler Evaluation: Main Renal Artery Resistive Index: 0.83 Venous waveform in main renal vein:  Present Intrarenal resistive index in upper pole:  0.76 (normal 0.6-0.8; equivocal 0.8-0.9; abnormal >= 0.9) Intrarenal resistive index in lower pole: 0.60 (normal 0.6-0.8; equivocal 0.8-0.9; abnormal >= 0.9) Bladder: Minimal debris within the urinary bladder, which is overall decompressed. Ureteral jets are not visualized. Other findings:  Trace free fluid right lower quadrant. IMPRESSION: 1. Unremarkable right lower quadrant transplant kidney. 2. Trace free fluid right lower quadrant. Electronically Signed   By: Randa Ngo M.D.   On: 02/04/2020 15:21   DG Chest Port 1 View  Result Date: 02/04/2020 CLINICAL DATA:  Hypotension. EXAM: PORTABLE CHEST 1 VIEW COMPARISON:  12/15/2019. FINDINGS: Mediastinum hilar structures normal. Left base atelectasis and infiltrate. Small left pleural effusion cannot be excluded. No pneumothorax. Heart size normal. No acute bony abnormality. IMPRESSION: Left base atelectasis and infiltrate. Small left pleural effusion cannot be excluded. Electronically Signed   By: Marcello Moores  Register   On: 02/04/2020 14:22   DG Foot Complete Right  Result Date: 02/05/2020 CLINICAL DATA:  Follow-up right  foot osteomyelitis. EXAM: RIGHT FOOT COMPLETE - 3+ VIEW COMPARISON:  None. FINDINGS: Examination demonstrates no evidence of osteomyelitis over the area of clinical concern of the hindfoot/calcaneus. No air in the soft tissues. Possible erosive change along the medial aspect of the head of the first proximal phalanx. Remainder the exam is unremarkable. IMPRESSION: 1. No evidence of osteomyelitis over the area of concern at the heel/calcaneus. 2. Possible focal erosive change at the first interphalangeal joint which may be due to inflammatory arthropathy. Electronically Signed   By: Marin Olp M.D.   On: 02/05/2020 10:23     Assessment/Plan: 1.  AKI/CKD stage IIIa- likely due to ischemic ATN in setting of volume depletion, hypotension, and recurrent Urosepsis.  Baseline Scr appears to be 1.6-1.8.  Continue with supportive care and follow UOP and  Scr.   2. S/p DDKT with chronic immunosuppressive agents- continue with prograf 1.5 mg bid and agree with stress dose steroids given hypotension and acute infection.  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  She also drives to [facility] every month for IV Belatacept.  3. Shock- hypovolemic +/- urosepsis- BP improved with IVF"s and stress dose steroids 4. Recurrent UTI's and infections- currently on IV Ceftriaxone.  Awaiting culture results. 5. Pleural effusions and pericardial effusion on CT scan- unclear etiology.  IVF"s held but not consistent with CHF given her hypotension.  ECHO pending 6. P. Atrial fibrillation- continue metoprolol and apixaban. 7. Type II DM- per primary 8. Hypothyroidism- TSH markedly elevated.  Start replacement therapy. 9. Severe deconditioning and debility- will likely require SNF and PT/OT 10. Right heel decubitus ulcer- since her last hospitalization she has had progressive debility and is now wheelchair bound.  Wound care following.  No evidence of osteo.  11. Disposition- will likely need SNF placement vs.  CIR   Governor [place] Madeline Pho 02/05/2020, 10:48 AM

## 2020-02-05 NOTE — Progress Notes (Signed)
Patient BP is low, patient is stable alert and oriented, denies pain, no c/o dizziness,MD notified, MD came to see patient see epic for orders. Will continue to monitor patient.

## 2020-02-05 NOTE — Evaluation (Signed)
Physical Therapy Evaluation Patient Details Name: Wanda [name] MRN: [id number] DOB: Mar 14, 1961 Today's Date: 02/05/2020   History of Present Illness  Pt is a 59 yo female presenting due to hypotension, weakness, and fatigue. Upon admission, pt found to have UTI causing hypotension and AKI. PMH includes: DM II, HTN, a-fib, HLD, ESRD with transplant 2020, microcytic anemia, and hx of gastric sleeve.  Clinical Impression  Pt in bed upon arrival of PT, agreeable to evaluation at this time. Prior to admission the pt was mobilizing with use of a WC, transferring with use of slide board and assist of family. The pt now presents with limitations in functional mobility, stability, and strength due to above dx and prolonged limitations in activity, and will continue to benefit from skilled PT to address these deficits. The pt is able to verbalize needs and technique well, but could continue to benefit from skilled PT acutely to improve activity tolerance and independence with bed mobility/transfers. The pt has good assist from family at home, but will continue to benefit from resumption of [facility] services following d/c to improve strength and reduce caregiver burden.     Follow Up Recommendations Supervision/Assistance - 24 hour(pt currently receiving HHPT and HHOT, resume services)    Equipment Recommendations  None recommended by PT(pt has needed equipment, possible need for lift in future to assist caregivers)    Recommendations for Other Services       Precautions / Restrictions Precautions Precautions: Fall Precaution Comments: Pt with WC use at baseline, wounds on bilateral heels, orthostatic Restrictions Weight Bearing Restrictions: No      Mobility  Bed Mobility Overal bed mobility: Needs Assistance Bed Mobility: Rolling;Sidelying to Sit;Sit to Supine Rolling: Min assist Sidelying to sit: Min assist   Sit to supine: Min assist;+2 for physical assistance   General bed mobility  comments: Pt able to come to sitting with minA for BLE movement to EOB, minA (through HHA not bed rail) for trunk elevation from elevated HOB, but need for added assist to safely reposition in bed due to onset of dizziness with seated position. BP from 110/64 to 70/44  Transfers Overall transfer level: Needs assistance Equipment used: None Transfers: Lateral/Scoot Transfers          Lateral/Scoot Transfers: Mod assist;+2 safety/equipment;+2 physical assistance General transfer comment: Pt able to scoot laterally in bed prior to return to supine with modA of 2 with use of bed pad.  Ambulation/Gait Ambulation/Gait assistance: (deferred at this time due to soft BP)              Stairs            Wheelchair Mobility    Modified Rankin (Stroke Patients Only)       Balance Overall balance assessment: Needs assistance Sitting-balance support: Bilateral upper extremity supported;Feet supported Sitting balance-Leahy Scale: Fair Sitting balance - Comments: BUE support on bed, minG for safety due to dizziness                                     Pertinent Vitals/Pain Pain Assessment: No/denies pain    Home Living Family/patient expects to be discharged to:: Private residence Living Arrangements: Spouse/significant other Available Help at Discharge: Family Type of Home: House Home Access: Level entry     Home Layout: One level Home Equipment: Hospital bed;Wheelchair - Education administrator (comment);Hand held shower head(slideboard) Additional Comments: pt has been using WC with slidebaord  transfers with family assist at home, house is able to accomodate [facility]    Prior Function Level of Independence: Needs assistance   Gait / Transfers Assistance Needed: assist needed for slideboard placement/transfer or if family wants to do squat-pivot transfer it is maxA. Pt reports she does not stand and has used WC since december  ADL's / Homemaking Assistance Needed: spouse  and family complete  Comments: pt able to verbalize technique and ask for assist as needed     Hand Dominance   Dominant Hand: Right    Extremity/Trunk Assessment   Upper Extremity Assessment Upper Extremity Assessment: Generalized weakness    Lower Extremity Assessment Lower Extremity Assessment: Generalized weakness    Cervical / Trunk Assessment Cervical / Trunk Assessment: Kyphotic  Communication   Communication: No difficulties  Cognition Arousal/Alertness: Awake/alert Behavior During Therapy: WFL for tasks assessed/performed Overall Cognitive Status: Within Functional Limits for tasks assessed                                 General Comments: Pt calm but cooperative through session      General Comments General comments (skin integrity, edema, etc.): BP dropped from 110/64 in supine to 70/44 with sitting EOB, returned to 114/70 with return to supine.    Exercises     Assessment/Plan    PT Assessment Patient needs continued PT services  PT Problem List Decreased strength;Decreased mobility;Decreased activity tolerance;Decreased skin integrity;Decreased balance       PT Treatment Interventions DME instruction;Balance training;Therapeutic exercise;Functional mobility training;Therapeutic activities;Patient/family education    PT Goals (Current goals can be found in the Care Plan section)  Acute Rehab PT Goals Patient Stated Goal: return home PT Goal Formulation: With patient Time For Goal Achievement: 02/19/20 Potential to Achieve Goals: Good    Frequency Min 3X/week   Barriers to discharge        Co-evaluation               AM-PAC PT "6 Clicks" Mobility  Outcome Measure Help needed turning from your back to your side while in a flat bed without using bedrails?: A Little Help needed moving from lying on your back to sitting on the side of a flat bed without using bedrails?: A Little Help needed moving to and from a bed to a chair  (including a wheelchair)?: A Lot Help needed standing up from a chair using your arms (e.g., wheelchair or bedside chair)?: Total Help needed to walk in hospital room?: Total Help needed climbing 3-5 steps with a railing? : Total 6 Click Score: 11    End of Session   Activity Tolerance: Patient tolerated treatment well;Patient limited by fatigue;Other (comment)(orthostatic with transition to sitting EOB) Patient left: in bed;with nursing/sitter in room;with call bell/phone within reach Nurse Communication: Mobility status(BP) PT Visit Diagnosis: Difficulty in walking, not elsewhere classified (R26.2);Muscle weakness (generalized) (M62.81)    Time: 3662-9476 PT Time Calculation (min) (ACUTE ONLY): 17 min   Charges:   PT Evaluation $PT Eval Moderate Complexity: 1 Mod          Karma Ganja, PT, DPT   Acute Rehabilitation Department Pager #: [phone]  Otho Bellows 02/05/2020, 12:59 PM

## 2020-02-05 NOTE — Consult Note (Signed)
[place] Nurse Consult Note: Patient receiving care in [place].  Primary RN at bedside during eval of right heel wound. Reason for Consult: "LLE wound" Wound type: RIGHT heel is unstageable wound that measures 2.3 cm x 4 cm x unknown depth.  Area with non-adherent gauze dressing and roll gauze.  No drainage expressed. Area is black and grey with discolored perimeter.   The left medial great toe has peeling skin, resembles a ruptured bulla. The left 5th toe has a small, dry ulcerated area on it, no drainage, no erythema.   The left posterior heel has scattered dried crusts, no drainage, no erythema.  All of these areas resemble abrasions from shoes, but the patient appears to be bed bound, etiology unclear at this time. Pressure Injury POA: Yes/No/NA Measurement: Wound bed: Drainage (amount, consistency, odor)  Periwound: Dressing procedure/placement/frequency:  Apply iodine from the swab sticks or swab pads in clean utility, to the following:  Right heel, left great toe, left 5th toe, left posterior heel.  Allow to air dry. Cover the left foot wounds with small foam dressings. Cover the right heel wound with dry gauze and kerlex.  Place feet into Prevalon heel lift boots.  I have communicated the information about the right heel to Dr. Marlowe Sax via [place].  If there is any concern for osteomyelitis of the heel, please consider studies to determine status.  Monitor the wound area(s) for worsening of condition such as: Signs/symptoms of infection,  Increase in size,  Development of or worsening of odor, Development of pain, or increased pain at the affected locations.  Notify the medical team if any of these develop.  Thank you for the consult.  Discussed plan of care with the patient and bedside nurse.  [place] nurse will not follow at this time.  Please re-consult the [place] team if needed.  Val Riles, RN, MSN, CWOCN, CNS-BC, pager [phone]

## 2020-02-06 ENCOUNTER — Inpatient Hospital Stay (HOSPITAL_COMMUNITY): Payer: Medicare HMO

## 2020-02-06 DIAGNOSIS — I739 Peripheral vascular disease, unspecified: Secondary | ICD-10-CM

## 2020-02-06 DIAGNOSIS — L0390 Cellulitis, unspecified: Secondary | ICD-10-CM | POA: Diagnosis not present

## 2020-02-06 LAB — CBC
HCT: 30.8 % — ABNORMAL LOW (ref 36.0–46.0)
Hemoglobin: 9.5 g/dL — ABNORMAL LOW (ref 12.0–15.0)
MCH: 32.9 pg (ref 26.0–34.0)
MCHC: 30.8 g/dL (ref 30.0–36.0)
MCV: 106.6 fL — ABNORMAL HIGH (ref 80.0–100.0)
Platelets: 235 10*3/uL (ref 150–400)
RBC: 2.89 MIL/uL — ABNORMAL LOW (ref 3.87–5.11)
RDW: 16 % — ABNORMAL HIGH (ref 11.5–15.5)
WBC: 3.4 10*3/uL — ABNORMAL LOW (ref 4.0–10.5)
nRBC: 0 % (ref 0.0–0.2)

## 2020-02-06 LAB — RENAL FUNCTION PANEL
Albumin: 1.6 g/dL — ABNORMAL LOW (ref 3.5–5.0)
Anion gap: 11 (ref 5–15)
BUN: 30 mg/dL — ABNORMAL HIGH (ref 6–20)
CO2: 18 mmol/L — ABNORMAL LOW (ref 22–32)
Calcium: 7.8 mg/dL — ABNORMAL LOW (ref 8.9–10.3)
Chloride: 112 mmol/L — ABNORMAL HIGH (ref 98–111)
Creatinine, Ser: 2.75 mg/dL — ABNORMAL HIGH (ref 0.44–1.00)
GFR calc Af Amer: 21 mL/min — ABNORMAL LOW (ref >60)
GFR calc non Af Amer: 18 mL/min — ABNORMAL LOW (ref >60)
Glucose, Bld: 166 mg/dL — ABNORMAL HIGH (ref 70–99)
Phosphorus: 3.2 mg/dL (ref 2.5–4.6)
Potassium: 3.8 mmol/L (ref 3.5–5.1)
Sodium: 141 mmol/L (ref 135–145)

## 2020-02-06 LAB — GLUCOSE, CAPILLARY
Glucose-Capillary: 138 mg/dL — ABNORMAL HIGH (ref 70–99)
Glucose-Capillary: 140 mg/dL — ABNORMAL HIGH (ref 70–99)
Glucose-Capillary: 189 mg/dL — ABNORMAL HIGH (ref 70–99)
Glucose-Capillary: 217 mg/dL — ABNORMAL HIGH (ref 70–99)

## 2020-02-06 MED ORDER — TRAZODONE HCL 50 MG PO TABS
50.0000 mg | ORAL_TABLET | Freq: Once | ORAL | Status: AC
  Administered 2020-02-06: 50 mg via ORAL
  Filled 2020-02-06: qty 1

## 2020-02-06 MED ORDER — WITCH HAZEL-GLYCERIN EX PADS
1.0000 "application " | MEDICATED_PAD | CUTANEOUS | Status: DC | PRN
  Administered 2020-02-06: 1 via TOPICAL
  Filled 2020-02-06: qty 100

## 2020-02-06 MED ORDER — PHENYLEPHRINE IN HARD FAT 0.25 % RE SUPP
1.0000 | Freq: Two times a day (BID) | RECTAL | Status: DC
  Filled 2020-02-06 (×11): qty 1

## 2020-02-06 NOTE — Progress Notes (Signed)
Soap sud enema given per order, minimal result, patient tolerate well. Report given to night RN. Will continue to monitor.

## 2020-02-06 NOTE — Plan of Care (Signed)
  Problem: Education: Goal: Knowledge of General Education information will improve Description: Including pain rating scale, medication(s)/side effects and non-pharmacologic comfort measures Outcome: Progressing   Problem: Health Behavior/Discharge Planning: Goal: Ability to manage health-related needs will improve Outcome: Progressing   Problem: Clinical Measurements: Goal: Ability to maintain clinical measurements within normal limits will improve Outcome: Progressing   Problem: Clinical Measurements: Goal: Cardiovascular complication will be avoided Outcome: Progressing   
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
## 2020-02-06 NOTE — Consult Note (Signed)
PV Navigator consult received and chart reviewed.   I was able to meet with Wanda [name] at bedside to introduce myself and my role.   Pt is a 59 yo female admitted with UTI causing hypotention and AKI. Admission assessment reveals wounds bilateral heels and left great and 5th toes.   PMH includes PAD, DM-2, HTN, A Fib, HLD, ESRD with transplant in 2015 and again in 2020, anemia and hx of gastric sleeve surgery.  Patient reports she has been living at home with her husband and daughter who are very supportive. Prior to this hospitalization, she reports [facility] services had just started sending out an Therapist, sports, PT and OT for care at home. Patient says she is chair/bed bound and has not walked in some time. She reports she has a hospital bed and wheelchair at home. She states wounds present <10month and her family has been caring for these areas.   Patient had VAS US venous studies and ABI completed today. Non-compressible results with ABI. Spoke with Dr Posey Pronto via SecureChat to discuss possible arterial duplex studies to assess flow BLE. Orders were placed. Awaiting MRI results to rule out osteomyelitis.  Other consulted disciplines include WOC, TOC, Nutrition, Diabetes Coordinator, PT and OT.   Patient denies any other barriers or questions at this time. Left patient my card with contact information should problems or questions arise.   Thank you for the opportunity to meet this patient.  Cletis Media RN BSN CWS [place] [phone]

## 2020-02-06 NOTE — Plan of Care (Signed)
  Problem: Pain Managment: Goal: General experience of comfort will improve Outcome: Progressing   Problem: Safety: Goal: Ability to remain free from injury will improve Outcome: Progressing   

## 2020-02-06 NOTE — Progress Notes (Signed)
[facility] Progress Note  Patient: Wanda [name]    KDX:[id number]  DOA: 02/04/2020     Date of Service: the patient was seen and examined on 02/06/2020  Chief Complaint  Patient presents with   Hypotension   Brief hospital course: Wanda [name] is a 59 y.o. female with medical history significant of T2DM, HTN, Paroxysmal A. Fib, HLD, ESRD with failed kidney transplant 2013 and repeat transplant September 2020 at [facility], microcytic anemia, Hx of gastric sleeve who presents with dysuria and hypotension.  Currently further plan is continue antibiotics.  Assessment and Plan: 1.  Sepsis POA. Secondary to UTI due to Klebsiella oxytoca Currently on IV antibiotics. Bactrim currently on hold. Blood pressure improving.  Monitor.  2.  Hypotension Blood pressure improving but still soft Lactic acid not elevated. IV albumin as well as 1 L of IV left start bolus. Added midodrine. Monitor on telemetry.  3.  Anasarca. Hypoalbuminemia. Moderate pericardial effusion without any tamponade. Bilateral pleural effusion. Suspect this is all in the setting of third spacing from hypoalbuminemia. Currently echocardiogram does not show any evidence of tamponade. Monitor. Low threshold to consult cardiology.  If the patient becomes symptomatic with low blood pressure. Monitor  4.  ESRD SP renal transplant. Anemia chronic kidney disease. Renal function worsening. Nephrology consulted for Appreciate assistance. Currently CellCept is on hold given concern for bacteremia and viremia.  5.  Paroxysmal A. fib Eliquis currently receiving. Metoprolol on hold.  6.  Type 2 diabetes mellitus with nephropathy and neuropathy. Continue gabapentin. Continue sliding scale insulin. Monitor.  7.  Moderate protein calorie malnutrition. Failure to thrive Body mass index is 25.65 kg/m.  Nutrition Problem: Moderate Malnutrition Etiology: chronic illness(ESRD s/p  transplant) Interventions: Interventions: Glucerna shake, MVI, Prostat   8.  Bilateral lower extremity edema. Bilateral lower extremity tenderness. Lower extremity ABI ordered. Lower extremity Dopplers ordered currently no evidence of DVT.  9.  Right lower lobe necrotic ulcer. Wound care consulted. Continue antibiotics. MRI foot as well as x-ray foot negative for any osteomyelitis and shows soft tissue edema. Monitor.  Diet: Renal diet DVT Prophylaxis: Therapeutic Anticoagulation with Eliquis   Advance goals of care discussion: Full code  Family Communication: no family was present at bedside, at the time of interview.   Disposition:  Status is: Inpatient  Remains inpatient appropriate because:IV treatments appropriate due to intensity of illness or inability to take PO and Inpatient level of care appropriate due to severity of illness   Dispo: The patient is from: Home              Anticipated d/c is to: SNF              Anticipated d/c date is: > 3 days              Patient currently is not medically stable to d/c.  Subjective: No acute complaint no nausea no vomiting.  Reports constipation.  No abdominal pain.  Physical Exam: General:  alert oriented to time, place, and person.  Appear in mild distress, affect appropriate Eyes: PERRL ENT: Oral Mucosa Clear, moist  Neck: difficult to assess  JVD,  Cardiovascular: S1 and S2 Present, no Murmur,  Respiratory: good respiratory effort, Bilateral Air entry equal and Decreased, no Crackles, no wheezes Abdomen: Bowel Sound present, Soft and no tenderness,  Skin: no rash Extremities: bilateral upper extremity edema,no pedal edema, no calf tenderness Neurologic: without any new focal findings Gait not checked due to patient safety concerns  Vitals:   02/06/20 0354 02/06/20 0715 02/06/20 1340 02/06/20 2003  BP: (!) 96/41 (!) 101/22 (!) 105/32 (!) 110/42  Pulse: 85 86 (!) 105 83  Resp: 18 17 18 18   Temp: (!) 97.4 F (36.3  C) (!) 97.4 F (36.3 C) (!) 97.5 F (36.4 C) 98 F (36.7 C)  TempSrc: Oral Oral Oral Oral  SpO2: 100% 100% 100% 100%  Weight: 78.8 kg     Height:        Intake/Output Summary (Last 24 hours) at 02/06/2020 2004 Last data filed at 02/06/2020 0357 Gross per 24 hour  Intake 240 ml  Output 200 ml  Net 40 ml   Filed Weights   02/04/20 2239 02/05/20 0507 02/06/20 0354  Weight: 76 kg 76 kg 78.8 kg    Data Reviewed: I have personally reviewed and interpreted daily labs, tele strips, imagings as discussed above. I reviewed all nursing notes, pharmacy notes, vitals, pertinent old records I have discussed plan of care as described above with RN and patient/family.  CBC: Recent Labs  Lab 02/04/20 1255 02/05/20 0633 02/06/20 0443  WBC 4.4 4.3 3.4*  HGB 11.1* 11.0* 9.5*  HCT 36.0 35.1* 30.8*  MCV 108.1* 106.4* 106.6*  PLT 237 239 235   Basic Metabolic Panel: Recent Labs  Lab 02/04/20 1315 02/05/20 0633 02/06/20 0443  NA 139 141 141  K 4.0 4.0 3.8  CL 112* 115* 112*  CO2 19* 18* 18*  GLUCOSE 219* 133* 166*  BUN 28* 31* 30*  CREATININE 2.74* 2.75* 2.75*  CALCIUM 8.6* 7.9* 7.8*  MG  --  2.1  --   PHOS  --  2.5 3.2    Studies: MR FOOT RIGHT WO CONTRAST  Result Date: 02/06/2020 CLINICAL DATA:  Hindfoot ulceration. Concern for osteomyelitis EXAM: MRI OF THE RIGHT HINDFOOT WITHOUT CONTRAST TECHNIQUE: Multiplanar, multisequence MR imaging of the ankle was performed. No intravenous contrast was administered. COMPARISON:  X-ray 02/05/2020 FINDINGS: Diffuse soft tissue edema. Mild skin irregularity at the posterior aspect of the ankle and hindfoot. No deep soft tissue ulceration. No well-defined fluid collection. TENDONS Peroneal: Mild tendinosis and possible short-segment split tear of the infra malleolar portion of the peroneus brevis tendon. Peroneus longus tendon intact. Trace tenosynovial fluid. Posteromedial: Intact tibialis posterior, flexor hallucis longus and flexor digitorum  longus tendons. Anterior: Intact tibialis anterior, extensor hallucis longus and extensor digitorum longus tendons. Achilles: Intact. Plantar Fascia: Intact. LIGAMENTS Lateral: The anterior and posterior tibiofibular ligaments are intact. The anterior and posterior talofibular ligaments are intact. Intact calcaneofibular ligament. Medial: Intact. CARTILAGE Ankle Joint: Anterior translation of the talar dome relative to the tibial plafond without dislocation. No focal chondral defect. Trace tibiotalar joint effusion. Subtalar Joints/Sinus Tarsi: No joint effusion or chondral defect. Bones: No acute fracture. No dislocation. No bone marrow edema. No cortical destruction. No suspicious bone lesion. Other: Diffuse muscular atrophy and fatty infiltration suggesting chronic denervation changes. Edema like intramuscular signal may also reflect denervation or myositis. IMPRESSION: 1. Negative for acute osteomyelitis. 2. Nonspecific soft tissue edema. No deep soft tissue ulceration. No well-defined fluid collection. 3. Chronic denervation changes of the intrinsic musculature of the right foot. Mild diffuse intramuscular edema which could reflect a nonspecific myositis. 4. Mild tendinosis and possible short-segment split tear of the infra-malleolar portion of the peroneus brevis tendon. Electronically Signed   By: Davina Poke D.O.   On: 02/06/2020 11:48   VAS [place] ABI WITH/WO TBI  Result Date: 02/06/2020 LOWER EXTREMITY DOPPLER STUDY Indications: Ulceration. High  Risk Factors: Diabetes. Other Factors: Leg pain.  Performing Technologist: June Leap Rvt, Rdms  Examination Guidelines: A complete evaluation includes at minimum, Doppler waveform signals and systolic blood pressure reading at the level of bilateral brachial, anterior tibial, and posterior tibial arteries, when vessel segments are accessible. Bilateral testing is considered an integral part of a complete examination. Photoelectric Plethysmograph (PPG)  waveforms and toe systolic pressure readings are included as required and additional duplex testing as needed. Limited examinations for reoccurring indications may be performed as noted.  ABI Findings: +---------+------------------+-----+----------+--------------------------------+  Right     Rt Pressure (mmHg) Index Waveform   Comment                           +---------+------------------+-----+----------+--------------------------------+  Brachial                                      unable to take BP due to                                                         restricted arm                    +---------+------------------+-----+----------+--------------------------------+  ATA       255                      biphasic                                     +---------+------------------+-----+----------+--------------------------------+  PTA       255                      monophasic                                   +---------+------------------+-----+----------+--------------------------------+  Great Toe 116                      Abnormal                                     +---------+------------------+-----+----------+--------------------------------+ +---------+------------------+-----+----------+--------------------------------+  Left      Lt Pressure (mmHg) Index Waveform   Comment                           +---------+------------------+-----+----------+--------------------------------+  Brachial                                      unable to take BP due to IV                                                      location upper arm                +---------+------------------+-----+----------+--------------------------------+  ATA       255                      biphasic                                     +---------+------------------+-----+----------+--------------------------------+  PTA       255                      monophasic                                    +---------+------------------+-----+----------+--------------------------------+  Great Toe 98                       Abnormal                                     +---------+------------------+-----+----------+--------------------------------+  Summary:  Cannot determine index as Brachial pressures cannot be taken from either arm. Bilateral pedal vessels are non compressible and bilateral toe waveforms are abnormal.  *See table(s) above for measurements and observations.  Electronically signed by Servando Snare MD on 02/06/2020 at 5:28:43 PM.   Final    VAS [place] LOWER EXTREMITY VENOUS (DVT)  Result Date: 02/06/2020  Lower Venous DVTStudy Indications: Edema.  Comparison Study: none Performing Technologist: June Leap RDMS, RVT  Examination Guidelines: A complete evaluation includes B-mode imaging, spectral Doppler, color Doppler, and power Doppler as needed of all accessible portions of each vessel. Bilateral testing is considered an integral part of a complete examination. Limited examinations for reoccurring indications may be performed as noted. The reflux portion of the exam is performed with the patient in reverse Trendelenburg.  +---------+---------------+---------+-----------+----------+--------------+  RIGHT     Compressibility Phasicity Spontaneity Properties Thrombus Aging  +---------+---------------+---------+-----------+----------+--------------+  CFV       Full            Yes       Yes                                    +---------+---------------+---------+-----------+----------+--------------+  SFJ       Full                                                             +---------+---------------+---------+-----------+----------+--------------+  FV Prox   Full                                                             +---------+---------------+---------+-----------+----------+--------------+  FV Mid    Full                                                              +---------+---------------+---------+-----------+----------+--------------+  FV Distal Full                                                             +---------+---------------+---------+-----------+----------+--------------+  PFV       Full                                                             +---------+---------------+---------+-----------+----------+--------------+  POP       Full            Yes       Yes                                    +---------+---------------+---------+-----------+----------+--------------+  PTV       Full                                                             +---------+---------------+---------+-----------+----------+--------------+  PERO      Full                                                             +---------+---------------+---------+-----------+----------+--------------+   +---------+---------------+---------+-----------+----------+--------------+  LEFT      Compressibility Phasicity Spontaneity Properties Thrombus Aging  +---------+---------------+---------+-----------+----------+--------------+  CFV       Full            Yes       Yes                                    +---------+---------------+---------+-----------+----------+--------------+  SFJ       Full                                                             +---------+---------------+---------+-----------+----------+--------------+  FV Prox   Full                                                             +---------+---------------+---------+-----------+----------+--------------+  FV Mid    Full                                                             +---------+---------------+---------+-----------+----------+--------------+  FV Distal Full                                                             +---------+---------------+---------+-----------+----------+--------------+  PFV       Full                                                              +---------+---------------+---------+-----------+----------+--------------+  POP       Full            Yes       Yes                                    +---------+---------------+---------+-----------+----------+--------------+  PTV       Full                                                             +---------+---------------+---------+-----------+----------+--------------+  PERO      Full                                                             +---------+---------------+---------+-----------+----------+--------------+     Summary: RIGHT: - There is no evidence of deep vein thrombosis in the lower extremity.  - No cystic structure found in the popliteal fossa.  LEFT: - There is no evidence of deep vein thrombosis in the lower extremity.  - No cystic structure found in the popliteal fossa.  *See table(s) above for measurements and observations. Electronically signed by Servando Snare MD on 02/06/2020 at 5:29:10 PM.    Final     Scheduled Meds:  allopurinol  100 mg Oral Daily   apixaban  5 mg Oral Q12H   atorvastatin  20 mg Oral QHS   calcitRIOL  0.25 mcg Oral Daily   cholecalciferol  1,000 Units Oral Daily   feeding supplement (GLUCERNA SHAKE)  237 mL Oral TID BM   feeding supplement (PRO-STAT SUGAR FREE 64)  30 mL Oral BID   hydrocortisone sod succinate (SOLU-CORTEF) inj  100 mg Intravenous Q8H   insulin aspart  0-5 Units Subcutaneous QHS   insulin aspart  0-9 Units Subcutaneous TID WC   midodrine  10 mg Oral TID WC   multivitamin with minerals  1 tablet Oral Daily   nortriptyline  25 mg Oral QHS   pantoprazole  40 mg Oral Daily   phosphorus  1 tablet Oral 3 times per day   tacrolimus  1.5 mg Oral BID   Continuous Infusions:  cefTRIAXone (ROCEPHIN)  IV 1 g (02/06/20 1229)   lactated ringers     PRN Meds: acetaminophen, famotidine, HYDROmorphone, ondansetron **OR** ondansetron (ZOFRAN)  IV, senna-docusate  Time spent: 35 minutes  Author: Berle Mull, MD [facility] 02/06/2020 8:04 PM  To reach On-call, see care teams to locate the attending and reach out to them via [email or web address]. If 7PM-7AM, please contact night-coverage If you still have difficulty reaching the attending provider, please page the [facility] (Director on Call) for [facility] on amion for assistance.

## 2020-02-06 NOTE — Progress Notes (Signed)
Lower venous duplex and ABI        have been completed. Preliminary results can be found under CV proc through chart review. June Leap, BS, RDMS, RVT

## 2020-02-06 NOTE — Progress Notes (Signed)
Patient ID: Wanda [name], female   DOB: 1961/07/20, 59 y.o.   MRN: [id number] S: Feels better today.   O:BP (!) 101/22 (BP Location: Left Arm)   Pulse 86   Temp (!) 97.4 F (36.3 C) (Oral)   Resp 17   Ht 5\' 9"  (1.753 m)   Wt 78.8 kg   SpO2 100%   BMI 25.65 kg/m   Intake/Output Summary (Last 24 hours) at 02/06/2020 1230 Last data filed at 02/06/2020 0357 Gross per 24 hour  Intake 1360 ml  Output 200 ml  Net 1160 ml   Intake/Output: I/O last 3 completed shifts: In: 1700 [P.O.:600; IV Piggyback:1100] Out: 200 [Urine:200]  Intake/Output this shift:  No intake/output data recorded. Weight change: 4.864 kg Gen: NAD CVS: RRR, no rub Resp: decreased BS at bases otherwise CTA Abd: +BS, soft, NT/ND, allograft in RLQ, nontender, no bruits. Ext: no edema, RUE AVF +T/B  Recent Labs  Lab 02/04/20 1315 02/05/20 0633 02/06/20 0443  NA 139 141 141  K 4.0 4.0 3.8  CL 112* 115* 112*  CO2 19* 18* 18*  GLUCOSE 219* 133* 166*  BUN 28* 31* 30*  CREATININE 2.74* 2.75* 2.75*  ALBUMIN 1.6* 1.2* 1.6*  CALCIUM 8.6* 7.9* 7.8*  PHOS  --  2.5 3.2  AST 24 58*  --   ALT 19 26  --    Liver Function Tests: Recent Labs  Lab 02/04/20 1315 02/05/20 0633 02/06/20 0443  AST 24 58*  --   ALT 19 26  --   ALKPHOS 484* 660*  --   BILITOT 0.9 0.5  --   PROT 5.2* 4.1*  --   ALBUMIN 1.6* 1.2* 1.6*   No results for input(s): LIPASE, AMYLASE in the last 168 hours. No results for input(s): AMMONIA in the last 168 hours. CBC: Recent Labs  Lab 02/04/20 1255 02/05/20 0633 02/06/20 0443  WBC 4.4 4.3 3.4*  HGB 11.1* 11.0* 9.5*  HCT 36.0 35.1* 30.8*  MCV 108.1* 106.4* 106.6*  PLT 237 239 235   Cardiac Enzymes: No results for input(s): CKTOTAL, CKMB, CKMBINDEX, TROPONINI in the last 168 hours. CBG: Recent Labs  Lab 02/05/20 1104 02/05/20 1600 02/05/20 2110 02/06/20 0619 02/06/20 1225  GLUCAP 94 113* 131* 138* 140*    Iron Studies: No results for input(s): IRON, TIBC, TRANSFERRIN,  FERRITIN in the last 72 hours. Studies/Results: CT ABDOMEN PELVIS WO CONTRAST  Result Date: 02/04/2020 CLINICAL DATA:  Hypotension, renal transplant EXAM: CT ABDOMEN AND PELVIS WITHOUT CONTRAST TECHNIQUE: Multidetector CT imaging of the abdomen and pelvis was performed following the standard protocol without IV contrast. COMPARISON:  07/31/2016, 02/04/2020 FINDINGS: Lower chest: There is a moderate pericardial effusion. Calcification of the mitral annulus is noted. The heart is not enlarged. There are bilateral pleural effusions, left greater than right. Compressive atelectasis left lower lobe. Hepatobiliary: No focal liver abnormality is seen. Status post cholecystectomy. No biliary dilatation. Pancreas: Unremarkable. No pancreatic ductal dilatation or surrounding inflammatory changes. Spleen: Indeterminate wedge-shaped hypodensity within the posteromedial aspect of the spleen measures up to 3.7 cm. This is nonspecific, with limited evaluation on unenhanced study. The remainder of the spleen is unremarkable. Adrenals/Urinary Tract: Native kidneys are markedly atrophic. Left renal cysts are again noted, minimally complex, and decreased in size since prior study. The adrenals are unremarkable. There is atrophied and calcified failed right lower quadrant transplant kidney. Left lower quadrant transplant kidney is grossly unremarkable in appearance with no evidence of hydronephrosis. The bladder is minimally distended with no  gross abnormalities. Stomach/Bowel: No bowel obstruction or ileus. No bowel wall thickening or inflammatory change. Normal appendix right lower quadrant. Vascular/Lymphatic: Extensive atherosclerosis is seen throughout the abdominal vasculature. No pathologic adenopathy. Reproductive: Uterus and bilateral adnexa are unremarkable. Other: There is trace free fluid within the left upper quadrant and pelvis. Extensive subcutaneous edema throughout the abdominal wall and subcutaneous tissues  compatible with anasarca. No free intraperitoneal gas. No abdominal wall hernia. Musculoskeletal: No acute or destructive bony lesions. Reconstructed images demonstrate no additional findings. IMPRESSION: 1. Moderate pericardial effusion. 2. Bilateral pleural effusions, left greater than right, with compressive atelectasis of the left lower lobe. 3. Indeterminate wedge-shaped hypodensity within the posteromedial aspect of the spleen measures up to 3.7 cm. This is nonspecific on an unenhanced exam, but does appear new since prior study. 4. Trace ascites. 5. Anasarca. 6. Left lower quadrant transplant kidney as above. Electronically Signed   By: Randa Ngo M.D.   On: 02/04/2020 17:49   US Renal Transplant w/Doppler  Result Date: 02/04/2020 CLINICAL DATA:  Hypotension EXAM: ULTRASOUND OF RENAL TRANSPLANT WITH RENAL DOPPLER ULTRASOUND TECHNIQUE: Ultrasound examination of the renal transplant was performed with gray-scale, color and duplex doppler evaluation. COMPARISON:  07/31/2016 FINDINGS: Transplant kidney location: RLQ Transplant Kidney: Renal measurements: 11.8 x 5.4 by 5.5 cm = volume: 170mL. Normal in size and parenchymal echogenicity. No evidence of mass or hydronephrosis. No peri-transplant fluid collection seen. Color flow in the main renal artery:  Yes Color flow in the main renal vein:  Yes Duplex Doppler Evaluation: Main Renal Artery Resistive Index: 0.83 Venous waveform in main renal vein:  Present Intrarenal resistive index in upper pole:  0.76 (normal 0.6-0.8; equivocal 0.8-0.9; abnormal >= 0.9) Intrarenal resistive index in lower pole: 0.60 (normal 0.6-0.8; equivocal 0.8-0.9; abnormal >= 0.9) Bladder: Minimal debris within the urinary bladder, which is overall decompressed. Ureteral jets are not visualized. Other findings:  Trace free fluid right lower quadrant. IMPRESSION: 1. Unremarkable right lower quadrant transplant kidney. 2. Trace free fluid right lower quadrant. Electronically Signed    By: Randa Ngo M.D.   On: 02/04/2020 15:21   MR FOOT RIGHT WO CONTRAST  Result Date: 02/06/2020 CLINICAL DATA:  Hindfoot ulceration. Concern for osteomyelitis EXAM: MRI OF THE RIGHT HINDFOOT WITHOUT CONTRAST TECHNIQUE: Multiplanar, multisequence MR imaging of the ankle was performed. No intravenous contrast was administered. COMPARISON:  X-ray 02/05/2020 FINDINGS: Diffuse soft tissue edema. Mild skin irregularity at the posterior aspect of the ankle and hindfoot. No deep soft tissue ulceration. No well-defined fluid collection. TENDONS Peroneal: Mild tendinosis and possible short-segment split tear of the infra malleolar portion of the peroneus brevis tendon. Peroneus longus tendon intact. Trace tenosynovial fluid. Posteromedial: Intact tibialis posterior, flexor hallucis longus and flexor digitorum longus tendons. Anterior: Intact tibialis anterior, extensor hallucis longus and extensor digitorum longus tendons. Achilles: Intact. Plantar Fascia: Intact. LIGAMENTS Lateral: The anterior and posterior tibiofibular ligaments are intact. The anterior and posterior talofibular ligaments are intact. Intact calcaneofibular ligament. Medial: Intact. CARTILAGE Ankle Joint: Anterior translation of the talar dome relative to the tibial plafond without dislocation. No focal chondral defect. Trace tibiotalar joint effusion. Subtalar Joints/Sinus Tarsi: No joint effusion or chondral defect. Bones: No acute fracture. No dislocation. No bone marrow edema. No cortical destruction. No suspicious bone lesion. Other: Diffuse muscular atrophy and fatty infiltration suggesting chronic denervation changes. Edema like intramuscular signal may also reflect denervation or myositis. IMPRESSION: 1. Negative for acute osteomyelitis. 2. Nonspecific soft tissue edema. No deep soft tissue ulceration. No well-defined  fluid collection. 3. Chronic denervation changes of the intrinsic musculature of the right foot. Mild diffuse intramuscular  edema which could reflect a nonspecific myositis. 4. Mild tendinosis and possible short-segment split tear of the infra-malleolar portion of the peroneus brevis tendon. Electronically Signed   By: Davina Poke D.O.   On: 02/06/2020 11:48   DG Chest Port 1 View  Result Date: 02/04/2020 CLINICAL DATA:  Hypotension. EXAM: PORTABLE CHEST 1 VIEW COMPARISON:  12/15/2019. FINDINGS: Mediastinum hilar structures normal. Left base atelectasis and infiltrate. Small left pleural effusion cannot be excluded. No pneumothorax. Heart size normal. No acute bony abnormality. IMPRESSION: Left base atelectasis and infiltrate. Small left pleural effusion cannot be excluded. Electronically Signed   By: Marcello Moores  Register   On: 02/04/2020 14:22   DG Foot Complete Right  Result Date: 02/05/2020 CLINICAL DATA:  Follow-up right foot osteomyelitis. EXAM: RIGHT FOOT COMPLETE - 3+ VIEW COMPARISON:  None. FINDINGS: Examination demonstrates no evidence of osteomyelitis over the area of clinical concern of the hindfoot/calcaneus. No air in the soft tissues. Possible erosive change along the medial aspect of the head of the first proximal phalanx. Remainder the exam is unremarkable. IMPRESSION: 1. No evidence of osteomyelitis over the area of concern at the heel/calcaneus. 2. Possible focal erosive change at the first interphalangeal joint which may be due to inflammatory arthropathy. Electronically Signed   By: Marin Olp M.D.   On: 02/05/2020 10:23   VAS [place] ABI WITH/WO TBI  Result Date: 02/06/2020 LOWER EXTREMITY DOPPLER STUDY Indications: Ulceration. High Risk Factors: Diabetes. Other Factors: Leg pain.  Performing Technologist: June Leap Rvt, Rdms  Examination Guidelines: A complete evaluation includes at minimum, Doppler waveform signals and systolic blood pressure reading at the level of bilateral brachial, anterior tibial, and posterior tibial arteries, when vessel segments are accessible. Bilateral testing is considered an  integral part of a complete examination. Photoelectric Plethysmograph (PPG) waveforms and toe systolic pressure readings are included as required and additional duplex testing as needed. Limited examinations for reoccurring indications may be performed as noted.  ABI Findings: +---------+------------------+-----+----------+--------------------------------+ Right    Rt Pressure (mmHg)IndexWaveform  Comment                          +---------+------------------+-----+----------+--------------------------------+ Brachial                                  unable to take BP due to                                                   restricted arm                   +---------+------------------+-----+----------+--------------------------------+ ATA      255                    biphasic                                   +---------+------------------+-----+----------+--------------------------------+ PTA      255                    monophasic                                 +---------+------------------+-----+----------+--------------------------------+  [id number]                    Abnormal                                   +---------+------------------+-----+----------+--------------------------------+ +---------+------------------+-----+----------+--------------------------------+ Left     Lt Pressure (mmHg)IndexWaveform  Comment                          +---------+------------------+-----+----------+--------------------------------+ Brachial                                  unable to take BP due to IV                                                location upper arm               +---------+------------------+-----+----------+--------------------------------+ ATA      255                    biphasic                                   +---------+------------------+-----+----------+--------------------------------+ PTA      255                    monophasic                                  +---------+------------------+-----+----------+--------------------------------+ Sharyl Nimrod                     Abnormal                                   +---------+------------------+-----+----------+--------------------------------+  Summary:  Cannot determine index as Brachial pressures cannot be taken from either arm. Bilateral pedal vessels are non compressible and bilateral toe waveforms are abnormal.  *See table(s) above for measurements and observations.    Preliminary    ECHOCARDIOGRAM COMPLETE  Result Date: 02/05/2020    ECHOCARDIOGRAM REPORT   Patient Name:   Wanda [name] Date of Exam: 02/05/2020 Medical Rec #:  [id number]         Height:       69.0 in Accession #:    [id number]        Weight:       167.5 lb Date of Birth:  05-Jun-1961        BSA:          1.916 m Patient Age:    3 years          BP:           71/25 mmHg Patient Gender: F                 HR:           85 bpm. Exam Location:  Inpatient Procedure: 2D Echo, Cardiac Doppler and Color Doppler Indications:    Pericardial effusion  History:  Patient has prior history of Echocardiogram examinations, most                 recent 01/09/2013. Pericardial Disease; Risk Factors:Diabetes,                 Hypertension and Obesity. Kidney transplant.  Sonographer:    Dustin Flock Referring Phys: [id number] [place]  1. Left ventricular ejection fraction, by estimation, is 70 to 75%. The left ventricle has hyperdynamic function. The left ventricle has no regional wall motion abnormalities. There is moderate left ventricular hypertrophy. Left ventricular diastolic parameters are consistent with Grade I diastolic dysfunction (impaired relaxation). Elevated left atrial pressure.  2. Right ventricular systolic function is normal. The right ventricular size is normal. There is mildly elevated pulmonary artery systolic pressure.  3. Left atrial size was mildly dilated.  4. Moderate pericardial  effusion. The pericardial effusion is circumferential. There is no evidence of cardiac tamponade.  5. The mitral valve is normal in structure. No evidence of mitral valve regurgitation. No evidence of mitral stenosis.  6. The aortic valve is tricuspid. Aortic valve regurgitation is not visualized. Mild aortic valve sclerosis is present, with no evidence of aortic valve stenosis.  7. The inferior vena cava is normal in size with greater than 50% respiratory variability, suggesting right atrial pressure of 3 mmHg. FINDINGS  Left Ventricle: Left ventricular ejection fraction, by estimation, is 70 to 75%. The left ventricle has hyperdynamic function. The left ventricle has no regional wall motion abnormalities. The left ventricular internal cavity size was normal in size. There is moderate left ventricular hypertrophy. Left ventricular diastolic parameters are consistent with Grade I diastolic dysfunction (impaired relaxation). Elevated left atrial pressure. Right Ventricle: The right ventricular size is normal. No increase in right ventricular wall thickness. Right ventricular systolic function is normal. There is mildly elevated pulmonary artery systolic pressure. The tricuspid regurgitant velocity is 2.80  m/s, and with an assumed right atrial pressure of 8 mmHg, the estimated right ventricular systolic pressure is 45.4 mmHg. Left Atrium: Left atrial size was mildly dilated. Right Atrium: Right atrial size was normal in size. Pericardium: A moderately sized pericardial effusion is present. The pericardial effusion is circumferential. There is no evidence of cardiac tamponade. Mitral Valve: The mitral valve is normal in structure. Normal mobility of the mitral valve leaflets. No evidence of mitral valve regurgitation. No evidence of mitral valve stenosis. Tricuspid Valve: The tricuspid valve is normal in structure. Tricuspid valve regurgitation is trivial. No evidence of tricuspid stenosis. Aortic Valve: The aortic  valve is tricuspid. Aortic valve regurgitation is not visualized. Mild aortic valve sclerosis is present, with no evidence of aortic valve stenosis. Pulmonic Valve: The pulmonic valve was normal in structure. Pulmonic valve regurgitation is not visualized. No evidence of pulmonic stenosis. Aorta: The aortic root is normal in size and structure. Venous: The inferior vena cava is normal in size with greater than 50% respiratory variability, suggesting right atrial pressure of 3 mmHg. IAS/Shunts: No atrial level shunt detected by color flow Doppler.  LEFT VENTRICLE PLAX 2D LVIDd:         3.04 cm  Diastology LVIDs:         2.23 cm  LV e' lateral:   4.46 cm/s LV PW:         1.39 cm  LV E/e' lateral: 19.8 LV IVS:        1.46 cm  LV e' medial:    3.81 cm/s LVOT diam:  2.10 cm  LV E/e' medial:  23.1 LV SV:         75 LV SV Index:   39 LVOT Area:     3.46 cm  RIGHT VENTRICLE RV Basal diam:  2.39 cm RV S prime:     13.80 cm/s TAPSE (M-mode): 2.2 cm LEFT ATRIUM             Index       RIGHT ATRIUM          Index LA diam:        3.40 cm 1.77 cm/m  RA Area:     9.40 cm LA Vol (A2C):   68.4 ml 35.69 ml/m RA Volume:   18.80 ml 9.81 ml/m LA Vol (A4C):   54.4 ml 28.39 ml/m LA Biplane Vol: 66.0 ml 34.44 ml/m  AORTIC VALVE LVOT Vmax:   117.00 cm/s LVOT Vmean:  80.500 cm/s LVOT VTI:    0.217 m  AORTA Ao Root diam: 3.00 cm MITRAL VALVE                TRICUSPID VALVE MV Area (PHT): 4.36 cm     TR Peak grad:   31.4 mmHg MV Decel Time: 174 msec     TR Vmax:        280.00 cm/s MV E velocity: 88.10 cm/s MV A velocity: 143.00 cm/s  SHUNTS MV E/A ratio:  0.62         Systemic VTI:  0.22 m                             Systemic Diam: 2.10 cm Candee Furbish MD Electronically signed by Candee Furbish MD Signature Date/Time: 02/05/2020/2:58:41 PM    Final    VAS [place] LOWER EXTREMITY VENOUS (DVT)  Result Date: 02/06/2020  Lower Venous DVTStudy Indications: Edema.  Comparison Study: none Performing Technologist: June Leap RDMS, RVT  Examination  Guidelines: A complete evaluation includes B-mode imaging, spectral Doppler, color Doppler, and power Doppler as needed of all accessible portions of each vessel. Bilateral testing is considered an integral part of a complete examination. Limited examinations for reoccurring indications may be performed as noted. The reflux portion of the exam is performed with the patient in reverse Trendelenburg.  +---------+---------------+---------+-----------+----------+--------------+ RIGHT    CompressibilityPhasicitySpontaneityPropertiesThrombus Aging +---------+---------------+---------+-----------+----------+--------------+ CFV      Full           Yes      Yes                                 +---------+---------------+---------+-----------+----------+--------------+ SFJ      Full                                                        +---------+---------------+---------+-----------+----------+--------------+ FV Prox  Full                                                        +---------+---------------+---------+-----------+----------+--------------+ FV Mid   Full                                                        +---------+---------------+---------+-----------+----------+--------------+  FV DistalFull                                                        +---------+---------------+---------+-----------+----------+--------------+ PFV      Full                                                        +---------+---------------+---------+-----------+----------+--------------+ POP      Full           Yes      Yes                                 +---------+---------------+---------+-----------+----------+--------------+ PTV      Full                                                        +---------+---------------+---------+-----------+----------+--------------+ PERO     Full                                                         +---------+---------------+---------+-----------+----------+--------------+   +---------+---------------+---------+-----------+----------+--------------+ LEFT     CompressibilityPhasicitySpontaneityPropertiesThrombus Aging +---------+---------------+---------+-----------+----------+--------------+ CFV      Full           Yes      Yes                                 +---------+---------------+---------+-----------+----------+--------------+ SFJ      Full                                                        +---------+---------------+---------+-----------+----------+--------------+ FV Prox  Full                                                        +---------+---------------+---------+-----------+----------+--------------+ FV Mid   Full                                                        +---------+---------------+---------+-----------+----------+--------------+ FV DistalFull                                                        +---------+---------------+---------+-----------+----------+--------------+  PFV      Full                                                        +---------+---------------+---------+-----------+----------+--------------+ POP      Full           Yes      Yes                                 +---------+---------------+---------+-----------+----------+--------------+ PTV      Full                                                        +---------+---------------+---------+-----------+----------+--------------+ PERO     Full                                                        +---------+---------------+---------+-----------+----------+--------------+     Summary: RIGHT: - There is no evidence of deep vein thrombosis in the lower extremity.  - No cystic structure found in the popliteal fossa.  LEFT: - There is no evidence of deep vein thrombosis in the lower extremity.  - No cystic structure found in the popliteal fossa.   *See table(s) above for measurements and observations.    Preliminary    . allopurinol  100 mg Oral Daily  . apixaban  5 mg Oral Q12H  . atorvastatin  20 mg Oral QHS  . calcitRIOL  0.25 mcg Oral Daily  . cholecalciferol  1,000 Units Oral Daily  . feeding supplement (GLUCERNA SHAKE)  237 mL Oral TID BM  . feeding supplement (PRO-STAT SUGAR FREE 64)  30 mL Oral BID  . hydrocortisone sod succinate (SOLU-CORTEF) inj  100 mg Intravenous Q8H  . insulin aspart  0-5 Units Subcutaneous QHS  . insulin aspart  0-9 Units Subcutaneous TID WC  . midodrine  10 mg Oral TID WC  . multivitamin with minerals  1 tablet Oral Daily  . nortriptyline  25 mg Oral QHS  . pantoprazole  40 mg Oral Daily  . phosphorus  1 tablet Oral 3 times per day  . tacrolimus  1.5 mg Oral BID    BMET    Component Value Date/Time   NA 141 02/06/2020 0443   K 3.8 02/06/2020 0443   CL 112 (H) 02/06/2020 0443   CO2 18 (L) 02/06/2020 0443   GLUCOSE 166 (H) 02/06/2020 0443   BUN 30 (H) 02/06/2020 0443   CREATININE 2.75 (H) 02/06/2020 0443   CREATININE 8.80 (H) 07/10/2014 1332   CALCIUM 7.8 (L) 02/06/2020 0443   GFRNONAA 18 (L) 02/06/2020 0443   GFRAA 21 (L) 02/06/2020 0443   CBC    Component Value Date/Time   WBC 3.4 (L) 02/06/2020 0443   RBC 2.89 (L) 02/06/2020 0443   HGB 9.5 (L) 02/06/2020 0443   HGB 10.5 (L) 08/03/2010 1119   HCT 30.8 (L) 02/06/2020 0443   HCT 30.7 (L) 08/03/2010 1119  PLT 235 02/06/2020 0443   PLT 277 08/03/2010 1119   MCV 106.6 (H) 02/06/2020 0443   MCV 84.6 08/03/2010 1119   MCH 32.9 02/06/2020 0443   MCHC 30.8 02/06/2020 0443   RDW 16.0 (H) 02/06/2020 0443   RDW 13.4 08/03/2010 1119   LYMPHSABS 1.1 12/15/2019 2112   LYMPHSABS 2.5 08/03/2010 1119   MONOABS 0.1 12/15/2019 2112   MONOABS 0.4 08/03/2010 1119   EOSABS 0.0 12/15/2019 2112   EOSABS 0.2 08/03/2010 1119   BASOSABS 0.0 12/15/2019 2112   BASOSABS 0.1 08/03/2010 1119    Assessment/Plan: 1.  AKI/CKD stage IIIa- likely due  to ischemic ATN in setting of volume depletion, hypotension, and recurrent Urosepsis.  Baseline Scr appears to be 1.6-1.8.  Continue with supportive care and follow UOP and Scr.   2. S/p DDKT with chronic immunosuppressive agents- continue with prograf 1.5 mg bid and agree with stress dose steroids given hypotension and acute infection.  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  She also drives to [facility] every month for IV Belatacept.  3. Hypotension/Shock- hypovolemic +/- urosepsis- BP improved with IVF"s and stress dose steroids 1. Started on midodrine and will follow. 4. Recurrent UTI's and infections- currently on IV Ceftriaxone.  Awaiting culture results. 5. Severe protein malnutrition- albumin 1.2-1.6.  Protein supplements and encourage po intake. 6. Pleural effusions and pericardial effusion on CT scan- unclear etiology but likely due to 3rd spacing from hypoalbuminemia.  IVF"s held but not consistent with CHF given her hypotension.  ECHO with EF of 70-75%, Grade I DD, elevated left atrial pressure, normal RV, moderate pericardial effusion. 7. P. Atrial fibrillation- continue metoprolol and apixaban. 8. Type II DM- per primary 9. Hypothyroidism- TSH markedly elevated.  Start replacement therapy. 10. Severe deconditioning and debility- will likely require SNF and PT/OT 11. Right heel decubitus ulcer- since her last hospitalization she has had progressive debility and is now wheelchair bound.  Wound care following.  No evidence of osteo.  12. Disposition- will likely need SNF placement vs. CIR  Donetta Potts, MD [facility] [phone]

## 2020-02-07 ENCOUNTER — Inpatient Hospital Stay (HOSPITAL_COMMUNITY): Payer: Medicare HMO

## 2020-02-07 DIAGNOSIS — I739 Peripheral vascular disease, unspecified: Secondary | ICD-10-CM | POA: Diagnosis not present

## 2020-02-07 LAB — URINE CULTURE: Culture: >=100000 — AB

## 2020-02-07 LAB — CBC WITH DIFFERENTIAL/PLATELET
Abs Immature Granulocytes: 0 10*3/uL (ref 0.00–0.07)
Basophils Absolute: 0 10*3/uL (ref 0.0–0.1)
Basophils Relative: 0 %
Eosinophils Absolute: 0 10*3/uL (ref 0.0–0.5)
Eosinophils Relative: 0 %
HCT: 32.2 % — ABNORMAL LOW (ref 36.0–46.0)
Hemoglobin: 10.1 g/dL — ABNORMAL LOW (ref 12.0–15.0)
Lymphocytes Relative: 5 %
Lymphs Abs: 0.2 10*3/uL — ABNORMAL LOW (ref 0.7–4.0)
MCH: 32.7 pg (ref 26.0–34.0)
MCHC: 31.4 g/dL (ref 30.0–36.0)
MCV: 104.2 fL — ABNORMAL HIGH (ref 80.0–100.0)
Monocytes Absolute: 0 10*3/uL — ABNORMAL LOW (ref 0.1–1.0)
Monocytes Relative: 1 %
Neutro Abs: 3.5 10*3/uL (ref 1.7–7.7)
Neutrophils Relative %: 94 %
Platelets: 292 10*3/uL (ref 150–400)
RBC: 3.09 MIL/uL — ABNORMAL LOW (ref 3.87–5.11)
RDW: 16.2 % — ABNORMAL HIGH (ref 11.5–15.5)
WBC: 3.7 10*3/uL — ABNORMAL LOW (ref 4.0–10.5)
nRBC: 0 % (ref 0.0–0.2)
nRBC: 0 /100 WBC

## 2020-02-07 LAB — RENAL FUNCTION PANEL
Albumin: 1.5 g/dL — ABNORMAL LOW (ref 3.5–5.0)
Anion gap: 7 (ref 5–15)
BUN: 37 mg/dL — ABNORMAL HIGH (ref 6–20)
CO2: 20 mmol/L — ABNORMAL LOW (ref 22–32)
Calcium: 7.5 mg/dL — ABNORMAL LOW (ref 8.9–10.3)
Chloride: 113 mmol/L — ABNORMAL HIGH (ref 98–111)
Creatinine, Ser: 2.95 mg/dL — ABNORMAL HIGH (ref 0.44–1.00)
GFR calc Af Amer: 19 mL/min — ABNORMAL LOW (ref >60)
GFR calc non Af Amer: 17 mL/min — ABNORMAL LOW (ref >60)
Glucose, Bld: 232 mg/dL — ABNORMAL HIGH (ref 70–99)
Phosphorus: 3.9 mg/dL (ref 2.5–4.6)
Potassium: 3.7 mmol/L (ref 3.5–5.1)
Sodium: 140 mmol/L (ref 135–145)

## 2020-02-07 LAB — GLUCOSE, CAPILLARY
Glucose-Capillary: 207 mg/dL — ABNORMAL HIGH (ref 70–99)
Glucose-Capillary: 211 mg/dL — ABNORMAL HIGH (ref 70–99)
Glucose-Capillary: 188 mg/dL — ABNORMAL HIGH (ref 70–99)
Glucose-Capillary: 198 mg/dL — ABNORMAL HIGH (ref 70–99)

## 2020-02-07 MED ORDER — SENNOSIDES-DOCUSATE SODIUM 8.6-50 MG PO TABS
1.0000 | ORAL_TABLET | Freq: Two times a day (BID) | ORAL | Status: DC
  Administered 2020-02-07 – 2020-02-11 (×8): 1 via ORAL
  Filled 2020-02-07 (×8): qty 1

## 2020-02-07 MED ORDER — PREDNISONE 50 MG PO TABS
50.0000 mg | ORAL_TABLET | Freq: Every day | ORAL | Status: DC
  Administered 2020-02-07 – 2020-02-08 (×2): 50 mg via ORAL
  Filled 2020-02-07: qty 1

## 2020-02-07 MED ORDER — BISACODYL 10 MG RE SUPP
10.0000 mg | Freq: Every day | RECTAL | Status: DC | PRN
  Filled 2020-02-07: qty 1

## 2020-02-07 MED ORDER — FOSFOMYCIN TROMETHAMINE 3 G PO PACK
3.0000 g | PACK | Freq: Once | ORAL | Status: AC
  Administered 2020-02-07: 3 g via ORAL
  Filled 2020-02-07: qty 3

## 2020-02-07 MED ORDER — CEFDINIR 300 MG PO CAPS
300.0000 mg | ORAL_CAPSULE | ORAL | Status: DC
  Administered 2020-02-07: 300 mg via ORAL
  Filled 2020-02-07: qty 1

## 2020-02-07 MED ORDER — POLYETHYLENE GLYCOL 3350 17 G PO PACK
17.0000 g | PACK | Freq: Every day | ORAL | Status: DC
  Administered 2020-02-07 – 2020-02-09 (×3): 17 g via ORAL
  Filled 2020-02-07 (×4): qty 1

## 2020-02-07 NOTE — Plan of Care (Signed)
  Problem: Education: Goal: Knowledge of General Education information will improve Description: Including pain rating scale, medication(s)/side effects and non-pharmacologic comfort measures Outcome: Progressing   Problem: Health Behavior/Discharge Planning: Goal: Ability to manage health-related needs will improve Outcome: Progressing   Problem: Clinical Measurements: Goal: Cardiovascular complication will be avoided Outcome: Progressing   Problem: Clinical Measurements: Goal: Respiratory complications will improve Outcome: Progressing

## 2020-02-07 NOTE — TOC Transition Note (Addendum)
Transition of Care [facility]) - CM/SW Discharge Note   Patient Details  Name: MERYLE PUGMIRE MRN: [id number] Date of Birth: [birth year]  [facility] of [facility]) CM/SW Contact:  Zenon Mayo, RN Phone Number: 02/07/2020, 3:11 PM   Clinical Narrative:    NCM received message from [place] with [facility] stating patient is active with [facility], patient will like to continue with them, NCM spoke with patient at bedside, she lives with spouse, he will be transport at dc.  She has no issues in getting medications, she goes to [facility] on [address],   She states she will also need a hoyer lift.  NCM made referral to zach with [employer].  This will be delivered to the patient's home. Will need HHPT, HHOT orders. TOC team will continue to follow for dc needs   Final next level of care: [place] Barriers to Discharge: Continued Medical Work up   Patient Goals and CMS Choice Patient states their goals for this hospitalization and ongoing recovery are:: get better CMS Medicare.gov Compare Post Acute Care list provided to:: Patient Choice offered to / list presented to : Patient  Discharge Placement                       Discharge Plan and Services   Discharge Planning Services: CM Consult Post Acute Care Choice: Home Health, Durable Medical Equipment          DME Arranged: Other see comment(Hoyer lift) DME Agency: [employer] Date DME Agency Contacted: 02/06/20 Time DME Agency Contacted: [id number] Representative spoke with at DME Agency: Thedore Mins HH Arranged: PT, OT [place] Agency: [facility] Date [place]: 02/07/20 Time [place]: [place] Representative spoke with at [place]: [place] ([place]) Interventions     Readmission Risk Interventions Readmission Risk Prevention Plan 02/06/2020  Transportation Screening Complete  PCP or Specialist Appt within 3-5 Days Complete  HRI or [place]  Complete  Social Work Consult for [place] Planning/Counseling [place] Not Applicable  Medication Review Press photographer) Complete  Some recent data might be hidden

## 2020-02-07 NOTE — Progress Notes (Addendum)
Occupational Therapy Treatment Patient Details Name: Wanda [name] MRN: [id number] DOB: 1961-09-14 Today's Date: 02/07/2020    History of present illness Pt is a 59 yo female presenting due to hypotension, weakness, and fatigue. Upon admission, pt found to have UTI causing hypotension and AKI. PMH includes: DM II, HTN, a-fib, HLD, ESRD with transplant 2020, microcytic anemia, and hx of gastric sleeve.   OT comments  Pt in bed upon arrival and agreeable to OOB with therapy. Pt required min - mod A with bed mobility to sit OEB, Fair balance with sup for washing face and donning clean gown. Utilized slide board to transfer to recliner max - mod A +2, max + 2 with initial scoot, then advancing to mod +2 using slide board. Pt reports that she has "butterflies", fears falling. Total A +2 to position pt's hips optimally in chair. OT will continue to follow acutely  Follow Up Recommendations  Home health OT;Supervision/Assistance - 24 hour (could benefit from ST SNF rehab stay, however do not believe pt will agree to it)   Equipment Recommendations  Other (comment)(hoyer lift)    Recommendations for Other Services      Precautions / Restrictions Precautions Precautions: Fall Precaution Comments: Pt with WC use at baseline, wounds on bilateral heels, orthostatic Restrictions Weight Bearing Restrictions: No       Mobility Bed Mobility Overal bed mobility: Needs Assistance Bed Mobility: Rolling;Sidelying to Sit;Sit to Supine Rolling: Min assist Sidelying to sit: Min assist;Mod assist       General bed mobility comments: min A with LEs to EOB, mod - min A to elevate trunk  Transfers Overall transfer level: Needs assistance Equipment used: Sliding board Transfers: Lateral/Scoot Transfers          Lateral/Scoot Transfers: Max assist;Mod assist;+2 physical assistance General transfer comment: max + 2 with initial scoot, then advancing to mod +2 using slide board. pt reports that  she has "butterflies", fears falling    Balance Overall balance assessment: Needs assistance Sitting-balance support: Bilateral upper extremity supported;Feet supported Sitting balance-Leahy Scale: Fair                                     ADL either performed or assessed with clinical judgement   ADL Overall ADL's : Needs assistance/impaired                                             Vision Patient Visual Report: No change from baseline     Perception     Praxis      Cognition Arousal/Alertness: Awake/alert Behavior During Therapy: WFL for tasks assessed/performed Overall Cognitive Status: Within Functional Limits for tasks assessed                                          Exercises Other Exercises Other Exercises: Pt performed B UE R exercises in multiple planes seated in recliner, AAROM L shoulder flexion and ABD   Shoulder Instructions       General Comments      Pertinent Vitals/ Pain       Pain Assessment: No/denies pain  Home Living  Prior Functioning/Environment              Frequency  Min 2X/week        Progress Toward Goals  OT Goals(current goals can now be found in the care plan section)  Progress towards OT goals: Progressing toward goals     Plan      Co-evaluation    PT/OT/SLP Co-Evaluation/Treatment: Yes Reason for Co-Treatment: Complexity of the patient's impairments (multi-system involvement);For patient/therapist safety;To address functional/ADL transfers   OT goals addressed during session: ADL's and self-care;Proper use of [employer] equipment and DME      AM-PAC OT "6 Clicks" Daily Activity     Outcome Measure   Help from another person eating meals?: None Help from another person taking care of personal grooming?: A Little Help from another person toileting, which includes using toliet, bedpan, or urinal?: A  Lot Help from another person bathing (including washing, rinsing, drying)?: A Lot Help from another person to put on and taking off regular upper body clothing?: A Little Help from another person to put on and taking off regular lower body clothing?: A Lot 6 Click Score: 16    End of Session Equipment Utilized During Treatment: Gait belt;Other (comment)(slide board)  OT Visit Diagnosis: Unsteadiness on feet (R26.81);Muscle weakness (generalized) (M62.81)   Activity Tolerance Patient tolerated treatment well   Patient Left in chair;with call bell/phone within reach   Nurse Communication          Time: 1884-1660 OT Time Calculation (min): 26 min  Charges: OT General Charges $OT Visit: 1 Visit OT Treatments $Self Care/Home Management : 8-22 mins     Britt Bottom 02/07/2020, 12:55 PM

## 2020-02-07 NOTE — Progress Notes (Signed)
OT Cancellation Note  Patient Details Name: Wanda [name] MRN: [id number] DOB: 04/12/1961   Cancelled Treatment:    Reason Eval/Treat Not Completed: Patient at procedure or test/ unavailable. Will try back later as available  Britt Bottom 02/07/2020, 10:38 AM

## 2020-02-07 NOTE — Progress Notes (Signed)
Patient ID: Wanda [name], female   DOB: [birth year], 60 y.o.   MRN: [id number] S: Feels better today O:BP 125/71 (BP Location: Left Arm)   Pulse 79   Temp (!) 97.4 F (36.3 C) (Oral)   Resp 19   Ht 5\' 9"  (1.753 m)   Wt 78.6 kg   SpO2 100%   BMI 25.59 kg/m   Intake/Output Summary (Last 24 hours) at 02/07/2020 1140 Last data filed at 02/07/2020 0651 Gross per 24 hour  Intake --  Output 200 ml  Net -200 ml   Intake/Output: I/O last 3 completed shifts: In: 240 [P.O.:240] Out: 400 [Urine:400]  Intake/Output this shift:  No intake/output data recorded. Weight change: -0.2 kg Gen: frail AAF lying in bed in NAD CVS: RRR, no rub Resp: cta Abd: +BS, soft, NT/ND Ext: no edema, RUE AVF +T/B  Recent Labs  Lab 02/04/20 1315 02/05/20 0633 02/06/20 0443 02/07/20 0635  NA 139 141 141 140  K 4.0 4.0 3.8 3.7  CL 112* 115* 112* 113*  CO2 19* 18* 18* 20*  GLUCOSE 219* 133* 166* 232*  BUN 28* 31* 30* 37*  CREATININE 2.74* 2.75* 2.75* 2.95*  ALBUMIN 1.6* 1.2* 1.6* 1.5*  CALCIUM 8.6* 7.9* 7.8* 7.5*  PHOS  --  2.5 3.2 3.9  AST 24 58*  --   --   ALT 19 26  --   --    Liver Function Tests: Recent Labs  Lab 02/04/20 1315 02/04/20 1315 02/05/20 0633 02/06/20 0443 02/07/20 0635  AST 24  --  58*  --   --   ALT 19  --  26  --   --   ALKPHOS 484*  --  660*  --   --   BILITOT 0.9  --  0.5  --   --   PROT 5.2*  --  4.1*  --   --   ALBUMIN 1.6*   < > 1.2* 1.6* 1.5*   < > = values in this interval not displayed.   No results for input(s): LIPASE, AMYLASE in the last 168 hours. No results for input(s): AMMONIA in the last 168 hours. CBC: Recent Labs  Lab 02/04/20 1255 02/04/20 1255 02/05/20 0633 02/06/20 0443 02/07/20 0635  WBC 4.4   < > 4.3 3.4* 3.7*  NEUTROABS  --   --   --   --  3.5  HGB 11.1*   < > 11.0* 9.5* 10.1*  HCT 36.0   < > 35.1* 30.8* 32.2*  MCV 108.1*  --  106.4* 106.6* 104.2*  PLT 237   < > 239 235 292   < > = values in this interval not displayed.    Cardiac Enzymes: No results for input(s): CKTOTAL, CKMB, CKMBINDEX, TROPONINI in the last 168 hours. CBG: Recent Labs  Lab 02/06/20 0619 02/06/20 1225 02/06/20 1639 02/06/20 2129 02/07/20 0629  GLUCAP 138* 140* 189* 217* 207*    Iron Studies: No results for input(s): IRON, TIBC, TRANSFERRIN, FERRITIN in the last 72 hours. Studies/Results: MR FOOT RIGHT WO CONTRAST  Result Date: 02/06/2020 CLINICAL DATA:  Hindfoot ulceration. Concern for osteomyelitis EXAM: MRI OF THE RIGHT HINDFOOT WITHOUT CONTRAST TECHNIQUE: Multiplanar, multisequence MR imaging of the ankle was performed. No intravenous contrast was administered. COMPARISON:  X-ray 02/05/2020 FINDINGS: Diffuse soft tissue edema. Mild skin irregularity at the posterior aspect of the ankle and hindfoot. No deep soft tissue ulceration. No well-defined fluid collection. TENDONS Peroneal: Mild tendinosis and possible short-segment split tear of the infra  malleolar portion of the peroneus brevis tendon. Peroneus longus tendon intact. Trace tenosynovial fluid. Posteromedial: Intact tibialis posterior, flexor hallucis longus and flexor digitorum longus tendons. Anterior: Intact tibialis anterior, extensor hallucis longus and extensor digitorum longus tendons. Achilles: Intact. Plantar Fascia: Intact. LIGAMENTS Lateral: The anterior and posterior tibiofibular ligaments are intact. The anterior and posterior talofibular ligaments are intact. Intact calcaneofibular ligament. Medial: Intact. CARTILAGE Ankle Joint: Anterior translation of the talar dome relative to the tibial plafond without dislocation. No focal chondral defect. Trace tibiotalar joint effusion. Subtalar Joints/Sinus Tarsi: No joint effusion or chondral defect. Bones: No acute fracture. No dislocation. No bone marrow edema. No cortical destruction. No suspicious bone lesion. Other: Diffuse muscular atrophy and fatty infiltration suggesting chronic denervation changes. Edema like  intramuscular signal may also reflect denervation or myositis. IMPRESSION: 1. Negative for acute osteomyelitis. 2. Nonspecific soft tissue edema. No deep soft tissue ulceration. No well-defined fluid collection. 3. Chronic denervation changes of the intrinsic musculature of the right foot. Mild diffuse intramuscular edema which could reflect a nonspecific myositis. 4. Mild tendinosis and possible short-segment split tear of the infra-malleolar portion of the peroneus brevis tendon. Electronically Signed   By: Davina Poke D.O.   On: 02/06/2020 11:48   VAS [place] ABI WITH/WO TBI  Result Date: 02/06/2020 LOWER EXTREMITY DOPPLER STUDY Indications: Ulceration. High Risk Factors: Diabetes. Other Factors: Leg pain.  Performing Technologist: June Leap Rvt, Rdms  Examination Guidelines: A complete evaluation includes at minimum, Doppler waveform signals and systolic blood pressure reading at the level of bilateral brachial, anterior tibial, and posterior tibial arteries, when vessel segments are accessible. Bilateral testing is considered an integral part of a complete examination. Photoelectric Plethysmograph (PPG) waveforms and toe systolic pressure readings are included as required and additional duplex testing as needed. Limited examinations for reoccurring indications may be performed as noted.  ABI Findings: +---------+------------------+-----+----------+--------------------------------+ Right    Rt Pressure (mmHg)IndexWaveform  Comment                          +---------+------------------+-----+----------+--------------------------------+ Brachial                                  unable to take BP due to                                                   restricted arm                   +---------+------------------+-----+----------+--------------------------------+ ATA      255                    biphasic                                    +---------+------------------+-----+----------+--------------------------------+ PTA      255                    monophasic                                 +---------+------------------+-----+----------+--------------------------------+ [facility]  Abnormal                                   +---------+------------------+-----+----------+--------------------------------+ +---------+------------------+-----+----------+--------------------------------+ Left     Lt Pressure (mmHg)IndexWaveform  Comment                          +---------+------------------+-----+----------+--------------------------------+ Brachial                                  unable to take BP due to IV                                                location upper arm               +---------+------------------+-----+----------+--------------------------------+ ATA      255                    biphasic                                   +---------+------------------+-----+----------+--------------------------------+ PTA      255                    monophasic                                 +---------+------------------+-----+----------+--------------------------------+ Sharyl Nimrod                     Abnormal                                   +---------+------------------+-----+----------+--------------------------------+  Summary:  Cannot determine index as Brachial pressures cannot be taken from either arm. Bilateral pedal vessels are non compressible and bilateral toe waveforms are abnormal.  *See table(s) above for measurements and observations.  Electronically signed by Servando Snare MD on 02/06/2020 at 5:28:43 PM.   Final    ECHOCARDIOGRAM COMPLETE  Result Date: 02/05/2020    ECHOCARDIOGRAM REPORT   Patient Name:   Wanda [name] Date of Exam: 02/05/2020 Medical Rec #:  [id number]         Height:       69.0 in Accession #:    [id number]        Weight:       167.5 lb Date of  Birth:  1961/02/26        BSA:          1.916 m Patient Age:    58 years          BP:           71/25 mmHg Patient Gender: F                 HR:           85 bpm. Exam Location:  Inpatient Procedure: 2D Echo, Cardiac Doppler and Color Doppler Indications:    Pericardial effusion  History:        Patient has prior history of Echocardiogram examinations,  most                 recent 01/09/2013. Pericardial Disease; Risk Factors:Diabetes,                 Hypertension and Obesity. Kidney transplant.  Sonographer:    Dustin Flock Referring Phys: [id number] [place]  1. Left ventricular ejection fraction, by estimation, is 70 to 75%. The left ventricle has hyperdynamic function. The left ventricle has no regional wall motion abnormalities. There is moderate left ventricular hypertrophy. Left ventricular diastolic parameters are consistent with Grade I diastolic dysfunction (impaired relaxation). Elevated left atrial pressure.  2. Right ventricular systolic function is normal. The right ventricular size is normal. There is mildly elevated pulmonary artery systolic pressure.  3. Left atrial size was mildly dilated.  4. Moderate pericardial effusion. The pericardial effusion is circumferential. There is no evidence of cardiac tamponade.  5. The mitral valve is normal in structure. No evidence of mitral valve regurgitation. No evidence of mitral stenosis.  6. The aortic valve is tricuspid. Aortic valve regurgitation is not visualized. Mild aortic valve sclerosis is present, with no evidence of aortic valve stenosis.  7. The inferior vena cava is normal in size with greater than 50% respiratory variability, suggesting right atrial pressure of 3 mmHg. FINDINGS  Left Ventricle: Left ventricular ejection fraction, by estimation, is 70 to 75%. The left ventricle has hyperdynamic function. The left ventricle has no regional wall motion abnormalities. The left ventricular internal cavity size was normal in size.  There is moderate left ventricular hypertrophy. Left ventricular diastolic parameters are consistent with Grade I diastolic dysfunction (impaired relaxation). Elevated left atrial pressure. Right Ventricle: The right ventricular size is normal. No increase in right ventricular wall thickness. Right ventricular systolic function is normal. There is mildly elevated pulmonary artery systolic pressure. The tricuspid regurgitant velocity is 2.80  m/s, and with an assumed right atrial pressure of 8 mmHg, the estimated right ventricular systolic pressure is 43.3 mmHg. Left Atrium: Left atrial size was mildly dilated. Right Atrium: Right atrial size was normal in size. Pericardium: A moderately sized pericardial effusion is present. The pericardial effusion is circumferential. There is no evidence of cardiac tamponade. Mitral Valve: The mitral valve is normal in structure. Normal mobility of the mitral valve leaflets. No evidence of mitral valve regurgitation. No evidence of mitral valve stenosis. Tricuspid Valve: The tricuspid valve is normal in structure. Tricuspid valve regurgitation is trivial. No evidence of tricuspid stenosis. Aortic Valve: The aortic valve is tricuspid. Aortic valve regurgitation is not visualized. Mild aortic valve sclerosis is present, with no evidence of aortic valve stenosis. Pulmonic Valve: The pulmonic valve was normal in structure. Pulmonic valve regurgitation is not visualized. No evidence of pulmonic stenosis. Aorta: The aortic root is normal in size and structure. Venous: The inferior vena cava is normal in size with greater than 50% respiratory variability, suggesting right atrial pressure of 3 mmHg. IAS/Shunts: No atrial level shunt detected by color flow Doppler.  LEFT VENTRICLE PLAX 2D LVIDd:         3.04 cm  Diastology LVIDs:         2.23 cm  LV e' lateral:   4.46 cm/s LV PW:         1.39 cm  LV E/e' lateral: 19.8 LV IVS:        1.46 cm  LV e' medial:    3.81 cm/s LVOT diam:     2.10  cm  LV E/e'  medial:  23.1 LV SV:         75 LV SV Index:   39 LVOT Area:     3.46 cm  RIGHT VENTRICLE RV Basal diam:  2.39 cm RV S prime:     13.80 cm/s TAPSE (M-mode): 2.2 cm LEFT ATRIUM             Index       RIGHT ATRIUM          Index LA diam:        3.40 cm 1.77 cm/m  RA Area:     9.40 cm LA Vol (A2C):   68.4 ml 35.69 ml/m RA Volume:   18.80 ml 9.81 ml/m LA Vol (A4C):   54.4 ml 28.39 ml/m LA Biplane Vol: 66.0 ml 34.44 ml/m  AORTIC VALVE LVOT Vmax:   117.00 cm/s LVOT Vmean:  80.500 cm/s LVOT VTI:    0.217 m  AORTA Ao Root diam: 3.00 cm MITRAL VALVE                TRICUSPID VALVE MV Area (PHT): 4.36 cm     TR Peak grad:   31.4 mmHg MV Decel Time: 174 msec     TR Vmax:        280.00 cm/s MV E velocity: 88.10 cm/s MV A velocity: 143.00 cm/s  SHUNTS MV E/A ratio:  0.62         Systemic VTI:  0.22 m                             Systemic Diam: 2.10 cm Candee Furbish MD Electronically signed by Candee Furbish MD Signature Date/Time: 02/05/2020/2:58:41 PM    Final    VAS [place] LOWER EXTREMITY ARTERIAL DUPLEX  Result Date: 02/07/2020 LOWER EXTREMITY ARTERIAL DUPLEX STUDY  Current ABI: Noncompressible pedal arteries bilaterally. 02/06/2020 Comparison Study: No prior study Performing Technologist: Maudry Mayhew MHA, RDMS, RVT, RDCS  Examination Guidelines: A complete evaluation includes B-mode imaging, spectral Doppler, color Doppler, and power Doppler as needed of all accessible portions of each vessel. Bilateral testing is considered an integral part of a complete examination. Limited examinations for reoccurring indications may be performed as noted.  +-----------+--------+-----+--------+----------+--------+ RIGHT      PSV cm/sRatioStenosisWaveform  Comments +-----------+--------+-----+--------+----------+--------+ CFA Distal 74                   triphasic          +-----------+--------+-----+--------+----------+--------+ DFA        52                   biphasic            +-----------+--------+-----+--------+----------+--------+ SFA Prox   65                   triphasic          +-----------+--------+-----+--------+----------+--------+ SFA Mid    68                   triphasic          +-----------+--------+-----+--------+----------+--------+ SFA Distal 70                   triphasic          +-----------+--------+-----+--------+----------+--------+ POP Distal 66                   triphasic          +-----------+--------+-----+--------+----------+--------+  TP Trunk   82                   triphasic          +-----------+--------+-----+--------+----------+--------+ ATA Distal 64                   monophasic         +-----------+--------+-----+--------+----------+--------+ PTA Distal 51                   monophasic         +-----------+--------+-----+--------+----------+--------+ PERO Distal45                   monophasic         +-----------+--------+-----+--------+----------+--------+  +-----------+--------+-----+--------+----------+--------+ LEFT       PSV cm/sRatioStenosisWaveform  Comments +-----------+--------+-----+--------+----------+--------+ CFA Distal 69                   triphasic          +-----------+--------+-----+--------+----------+--------+ DFA        54                   triphasic          +-----------+--------+-----+--------+----------+--------+ SFA Prox   71                   triphasic          +-----------+--------+-----+--------+----------+--------+ SFA Mid    63                   triphasic          +-----------+--------+-----+--------+----------+--------+ SFA Distal 64                   triphasic          +-----------+--------+-----+--------+----------+--------+ POP Distal 68                   triphasic          +-----------+--------+-----+--------+----------+--------+ TP Trunk   69                   monophasic          +-----------+--------+-----+--------+----------+--------+ ATA Distal 64                   triphasic          +-----------+--------+-----+--------+----------+--------+ PTA Prox   26                   monophasic         +-----------+--------+-----+--------+----------+--------+ PTA Mid    20                   monophasic         +-----------+--------+-----+--------+----------+--------+ PTA Distal                      absent             +-----------+--------+-----+--------+----------+--------+ PERO Distal62                   triphasic          +-----------+--------+-----+--------+----------+--------+  Summary: Bilateral: No evidence of hemodynamically significant stenosis involving the visualized arteries of bilateral lower extremities. Waveforms are diminished distally in bilateral lower extremities.  See table(s) above for measurements and observations.    Preliminary    VAS [place] LOWER EXTREMITY VENOUS (DVT)  Result Date: 02/06/2020  Lower Venous DVTStudy Indications: Edema.  Comparison Study: none Performing Technologist: June Leap RDMS, RVT  Examination Guidelines: A complete evaluation includes B-mode imaging, spectral Doppler, color Doppler, and power Doppler as needed of all accessible portions of each vessel. Bilateral testing is considered an integral part of a complete examination. Limited examinations for reoccurring indications may be performed as noted. The reflux portion of the exam is performed with the patient in reverse Trendelenburg.  +---------+---------------+---------+-----------+----------+--------------+ RIGHT    CompressibilityPhasicitySpontaneityPropertiesThrombus Aging +---------+---------------+---------+-----------+----------+--------------+ CFV      Full           Yes      Yes                                 +---------+---------------+---------+-----------+----------+--------------+ SFJ      Full                                                         +---------+---------------+---------+-----------+----------+--------------+ FV Prox  Full                                                        +---------+---------------+---------+-----------+----------+--------------+ FV Mid   Full                                                        +---------+---------------+---------+-----------+----------+--------------+ FV DistalFull                                                        +---------+---------------+---------+-----------+----------+--------------+ PFV      Full                                                        +---------+---------------+---------+-----------+----------+--------------+ POP      Full           Yes      Yes                                 +---------+---------------+---------+-----------+----------+--------------+ PTV      Full                                                        +---------+---------------+---------+-----------+----------+--------------+ PERO     Full                                                        +---------+---------------+---------+-----------+----------+--------------+   +---------+---------------+---------+-----------+----------+--------------+  LEFT     CompressibilityPhasicitySpontaneityPropertiesThrombus Aging +---------+---------------+---------+-----------+----------+--------------+ CFV      Full           Yes      Yes                                 +---------+---------------+---------+-----------+----------+--------------+ SFJ      Full                                                        +---------+---------------+---------+-----------+----------+--------------+ FV Prox  Full                                                        +---------+---------------+---------+-----------+----------+--------------+ FV Mid   Full                                                         +---------+---------------+---------+-----------+----------+--------------+ FV DistalFull                                                        +---------+---------------+---------+-----------+----------+--------------+ PFV      Full                                                        +---------+---------------+---------+-----------+----------+--------------+ POP      Full           Yes      Yes                                 +---------+---------------+---------+-----------+----------+--------------+ PTV      Full                                                        +---------+---------------+---------+-----------+----------+--------------+ PERO     Full                                                        +---------+---------------+---------+-----------+----------+--------------+     Summary: RIGHT: - There is no evidence of deep vein thrombosis in the lower extremity.  - No cystic structure found in the popliteal fossa.  LEFT: - There is no evidence of deep vein thrombosis in the lower extremity.  - No  cystic structure found in the popliteal fossa.  *See table(s) above for measurements and observations. Electronically signed by Servando Snare MD on 02/06/2020 at 5:29:10 PM.    Final    . allopurinol  100 mg Oral Daily  . apixaban  5 mg Oral Q12H  . atorvastatin  20 mg Oral QHS  . calcitRIOL  0.25 mcg Oral Daily  . cefdinir  300 mg Oral Q24H  . cholecalciferol  1,000 Units Oral Daily  . feeding supplement (GLUCERNA SHAKE)  237 mL Oral TID BM  . feeding supplement (PRO-STAT SUGAR FREE 64)  30 mL Oral BID  . fosfomycin  3 g Oral Once  . insulin aspart  0-5 Units Subcutaneous QHS  . insulin aspart  0-9 Units Subcutaneous TID WC  . midodrine  10 mg Oral TID WC  . multivitamin with minerals  1 tablet Oral Daily  . nortriptyline  25 mg Oral QHS  . pantoprazole  40 mg Oral Daily  . phenylephrine  1 suppository Rectal BID  . phosphorus  1 tablet Oral 3 times per day   . polyethylene glycol  17 g Oral Daily  . predniSONE  50 mg Oral Q breakfast  . senna-docusate  1 tablet Oral BID  . tacrolimus  1.5 mg Oral BID    BMET    Component Value Date/Time   NA 140 02/07/2020 0635   K 3.7 02/07/2020 0635   CL 113 (H) 02/07/2020 0635   CO2 20 (L) 02/07/2020 0635   GLUCOSE 232 (H) 02/07/2020 0635   BUN 37 (H) 02/07/2020 0635   CREATININE 2.95 (H) 02/07/2020 0635   CREATININE 8.80 (H) 07/10/2014 1332   CALCIUM 7.5 (L) 02/07/2020 0635   GFRNONAA 17 (L) 02/07/2020 0635   GFRAA 19 (L) 02/07/2020 0635   CBC    Component Value Date/Time   WBC 3.7 (L) 02/07/2020 0635   RBC 3.09 (L) 02/07/2020 0635   HGB 10.1 (L) 02/07/2020 0635   HGB 10.5 (L) 08/03/2010 1119   HCT 32.2 (L) 02/07/2020 0635   HCT 30.7 (L) 08/03/2010 1119   PLT 292 02/07/2020 0635   PLT 277 08/03/2010 1119   MCV 104.2 (H) 02/07/2020 0635   MCV 84.6 08/03/2010 1119   MCH 32.7 02/07/2020 0635   MCHC 31.4 02/07/2020 0635   RDW 16.2 (H) 02/07/2020 0635   RDW 13.4 08/03/2010 1119   LYMPHSABS 0.2 (L) 02/07/2020 0635   LYMPHSABS 2.5 08/03/2010 1119   MONOABS 0.0 (L) 02/07/2020 0635   MONOABS 0.4 08/03/2010 1119   EOSABS 0.0 02/07/2020 0635   EOSABS 0.2 08/03/2010 1119   BASOSABS 0.0 02/07/2020 0635   BASOSABS 0.1 08/03/2010 1119   Assessment/Plan: 1. AKI/CKD stage IIIa- likely due to ischemic ATN in setting of volume depletion, hypotension, and recurrent Urosepsis. Baseline Scr appears to be 1.6-1.8. Continue with supportive care and follow UOP and Scr.  2. S/p DDKT with chronic immunosuppressive agents- continue with prograf 1.5 mg bid and agree with stress dose steroids given hypotension and acute infection. 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 She also drives to [facility] every month for IV Belatacept.  3. Hypotension/Shock- hypovolemic +/- urosepsis- BP improved with IVF"s and stress dose steroids 1. Started on midodrine and will follow. 4. Recurrent UTI's and  infections- currently on IV Ceftriaxone. Urine culture + for Klebsiella >100,000 cfu,sensitive to ceftriaxone,  Enterococcus 40,000 cfu sensitive only to vanc and imipenem.  Abx per primary 5. Severe protein malnutrition- albumin 1.2-1.6.  Protein supplements and encourage po intake. 6. Pleural effusions and pericardial effusion on CT scan- unclear etiology but likely due to 3rd spacing from hypoalbuminemia. IVF"s held but not consistent with CHF given her hypotension. ECHO with EF of 70-75%, Grade I DD, elevated left atrial pressure, normal RV, moderate pericardial effusion. 7. P. Atrial fibrillation- continue metoprolol and apixaban. 8. Type II DM- per primary 9. Hypothyroidism- TSH markedly elevated. Start replacement therapy. 10. Severe deconditioning and debility- will likely require SNF and PT/OT 11. Right heel decubitus ulcer- since her last hospitalization she has had progressive debility and is now wheelchair bound. Wound care following. No evidence of osteo.  12. Disposition- will likely need SNF placement vs. CIR  Donetta Potts, MD [facility] [phone]

## 2020-02-07 NOTE — Progress Notes (Signed)
Patient going to vascular transport in the room

## 2020-02-07 NOTE — Progress Notes (Signed)
Physical Therapy Treatment Patient Details Name: Wanda [name] MRN: [id number] DOB: 1961/05/04 Today's Date: 02/07/2020    History of Present Illness Pt is a 59 yo female presenting due to hypotension, weakness, and fatigue. Upon admission, pt found to have UTI causing hypotension and AKI. PMH includes: DM II, HTN, a-fib, HLD, ESRD with transplant 2020, microcytic anemia, and hx of gastric sleeve.    PT Comments    Pt agreeable to therapy on entry. Pt bed found to be saturated in urine. Pt requiring min A for rolling for pericare and clean linen placement. Pt requires modAx2 for coming to seated at [facility]. Pt reports no dizziness with positional change today. Pt utilized transfer board to complete lateral scoot transfer to chair. Pt requires maxAx2 for initial scoot onto board and modAx2 for scooting across slide board to drop arm recliner. Given pt level of care at home and being relatively close to her baseline mobility d/c plans remain appropriate. PT will continue to follow acutely.     Follow Up Recommendations  Supervision/Assistance - 24 hour(resumption of HHPT/OT)     Equipment Recommendations  Other (comment)(hoyer lift)       Precautions / Restrictions Precautions Precautions: Fall Precaution Comments: Pt with WC use at baseline, wounds on bilateral heels, orthostatic Restrictions Weight Bearing Restrictions: No    Mobility  Bed Mobility Overal bed mobility: Needs Assistance Bed Mobility: Rolling;Sidelying to Sit;Sit to Supine Rolling: Min assist Sidelying to sit: Min assist;Mod assist       General bed mobility comments: min A with LEs to EOB, mod - min A to elevate trunk  Transfers Overall transfer level: Needs assistance Equipment used: Sliding board Transfers: Lateral/Scoot Transfers          Lateral/Scoot Transfers: Max assist;Mod assist;+2 physical assistance General transfer comment: max + 2 with initial scoot, then advancing to mod +2 using slide  board. pt reports that she has "butterflies", fears falling        Balance Overall balance assessment: Needs assistance Sitting-balance support: Bilateral upper extremity supported;Feet supported Sitting balance-Leahy Scale: Fair Sitting balance - Comments: reports no dizziness                                    Cognition Arousal/Alertness: Awake/alert Behavior During Therapy: WFL for tasks assessed/performed Overall Cognitive Status: Within Functional Limits for tasks assessed                                        Exercises Other Exercises Other Exercises: Pt performed B UE R exercises in multiple planes seated in recliner, AAROM L shoulder flexion and ABD    General Comments General comments (skin integrity, edema, etc.): pt reports no dizziness today with movement      Pertinent Vitals/Pain Pain Assessment: No/denies pain(mentions hemeroids)           PT Goals (current goals can now be found in the care plan section) Acute Rehab PT Goals PT Goal Formulation: With patient Time For Goal Achievement: 02/19/20 Potential to Achieve Goals: Good Progress towards PT goals: Progressing toward goals    Frequency    Min 3X/week      PT Plan Current plan remains appropriate    Co-evaluation PT/OT/SLP Co-Evaluation/Treatment: Yes Reason for Co-Treatment: Complexity of the patient's impairments (multi-system involvement) PT goals addressed during session:  Mobility/safety with mobility OT goals addressed during session: ADL's and self-care;Proper use of Adaptive equipment and DME      AM-PAC PT "6 Clicks" Mobility   Outcome Measure  Help needed turning from your back to your side while in a flat bed without using bedrails?: A Little Help needed moving from lying on your back to sitting on the side of a flat bed without using bedrails?: A Little Help needed moving to and from a bed to a chair (including a wheelchair)?: Total Help  needed standing up from a chair using your arms (e.g., wheelchair or bedside chair)?: Total Help needed to walk in hospital room?: Total Help needed climbing 3-5 steps with a railing? : Total 6 Click Score: 10    End of Session Equipment Utilized During Treatment: Gait belt Activity Tolerance: Patient tolerated treatment well Patient left: in chair;with call bell/phone within reach;with chair alarm set Nurse Communication: Mobility status PT Visit Diagnosis: Difficulty in walking, not elsewhere classified (R26.2);Muscle weakness (generalized) (M62.81)     Time: 1751-0258 PT Time Calculation (min) (ACUTE ONLY): 26 min  Charges:  $Therapeutic Activity: 8-22 mins                     Amma Crear B. Migdalia Dk PT, DPT Acute Rehabilitation Services Pager [phone] Office [phone]    [place] 02/07/2020, 1:58 PM

## 2020-02-07 NOTE — Progress Notes (Signed)
[facility] Progress Note  Patient: Wanda [name]    SWH:[id number]  DOA: 02/04/2020     Date of Service: the patient was seen and examined on 02/07/2020  Chief Complaint  Patient presents with  . Hypotension   Brief hospital course: Wanda [name] is a 59 y.o. female with medical history significant of T2DM, HTN, Paroxysmal A. 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  Currently further plan is continue antibiotics.  Assessment and Plan: 1.  Sepsis POA. Secondary to UTI due to Klebsiella oxytoca as well as Enterococcus  Currently on IV antibiotics.  Will change to oral antibiotics.  1 dose of fosfomycin. Bactrim currently on hold. Blood pressure improving.  Monitor.  2.  Hypotension Blood pressure improving but still soft Lactic acid not elevated. IV albumin as well as 1 L of IV bolus was given Added midodrine. Monitor on telemetry.  3.  Anasarca. Hypoalbuminemia. Moderate pericardial effusion without any tamponade. Bilateral pleural effusion. Suspect this is all in the setting of third spacing from hypoalbuminemia. Currently echocardiogram does not show any evidence of tamponade. Monitor. Low threshold to consult cardiology.  If the patient becomes symptomatic with low blood pressure. Monitor Repeat echocardiogram on Sunday  4.  ESRD SP renal transplant. Anemia chronic kidney disease. Renal function worsening. Nephrology consulted for Appreciate assistance. Currently CellCept is on hold given concern for bacteremia and viremia.  5.  Paroxysmal A. fib Eliquis currently receiving. Metoprolol on hold.  6.  Type 2 diabetes mellitus with nephropathy and neuropathy. Continue gabapentin. Continue sliding scale insulin. Monitor.  7.  Moderate protein calorie malnutrition. Failure to thrive Body mass index is 25.59 kg/m.   Nutrition Problem: Moderate Malnutrition Etiology: chronic illness(ESRD s/p transplant) Interventions: Interventions: Glucerna shake, MVI, Prostat   8.  Bilateral lower extremity edema. Bilateral lower extremity tenderness. Lower extremity ABI ordered. Lower extremity Dopplers ordered currently no evidence of DVT.  9.  Right lower lobe necrotic ulcer. Wound care consulted. Continue antibiotics. MRI foot as well as x-ray foot negative for any osteomyelitis and shows soft tissue edema. Monitor.  Diet: Carb modified diet DVT Prophylaxis: Therapeutic Anticoagulation with Eliquis   Advance goals of care discussion: Full code  Family Communication: no family was present at bedside, at the time of interview.   Disposition:  Status is: Inpatient  Remains inpatient appropriate because:IV treatments appropriate due to intensity of illness or inability to take PO and Inpatient level of care appropriate due to severity of illness   Dispo: The patient is from: Home              Anticipated d/c is to: SNF              Anticipated d/c date is: > 3 days              Patient currently is not medically stable to d/c.  Subjective: Sitting in the chair.  No acute events.  No acute complaints.  No nausea no vomiting.  Physical Exam: General:  alert oriented to time, place, and person.  Appear in mild distress, affect appropriate Eyes: PERRL ENT: Oral Mucosa Clear, moist  Neck: difficult to assess  JVD,  Cardiovascular: S1 and S2 Present, no Murmur,  Respiratory: good respiratory effort, Bilateral Air entry equal and Decreased, no Crackles, no wheezes Abdomen: Bowel Sound present, Soft and no tenderness,  Skin: no rash Extremities:  bilateral upper extremity edema,no pedal edema, no calf tenderness Neurologic: without any new focal findings Gait not checked due to patient safety concerns  Vitals:   02/07/20 0108 02/07/20 0116 02/07/20 0408 02/07/20 1210  BP:  (!) 94/47 125/71 (!) 125/25   Pulse:  88 79 84  Resp:  19 19 20   Temp:  (!) 97.4 F (36.3 C) (!) 97.4 F (36.3 C) (!) 97.4 F (36.3 C)  TempSrc:  Oral Oral Oral  SpO2:  98% 100%   Weight: 78.6 kg  78.6 kg   Height:        Intake/Output Summary (Last 24 hours) at 02/07/2020 1759 Last data filed at 02/07/2020 4332 Gross per 24 hour  Intake -  Output 200 ml  Net -200 ml   Filed Weights   02/06/20 0354 02/07/20 0108 02/07/20 0408  Weight: 78.8 kg 78.6 kg 78.6 kg    Data Reviewed: I have personally reviewed and interpreted daily labs, tele strips, imagings as discussed above. I reviewed all nursing notes, pharmacy notes, vitals, pertinent old records I have discussed plan of care as described above with RN and patient/family.  CBC: Recent Labs  Lab 02/04/20 1255 02/05/20 0633 02/06/20 0443 02/07/20 0635  WBC 4.4 4.3 3.4* 3.7*  NEUTROABS  --   --   --  3.5  HGB 11.1* 11.0* 9.5* 10.1*  HCT 36.0 35.1* 30.8* 32.2*  MCV 108.1* 106.4* 106.6* 104.2*  PLT 237 239 235 951   Basic Metabolic Panel: Recent Labs  Lab 02/04/20 1315 02/05/20 0633 02/06/20 0443 02/07/20 0635  NA 139 141 141 140  K 4.0 4.0 3.8 3.7  CL 112* 115* 112* 113*  CO2 19* 18* 18* 20*  GLUCOSE 219* 133* 166* 232*  BUN 28* 31* 30* 37*  CREATININE 2.74* 2.75* 2.75* 2.95*  CALCIUM 8.6* 7.9* 7.8* 7.5*  MG  --  2.1  --   --   PHOS  --  2.5 3.2 3.9    Studies: VAS [place] LOWER EXTREMITY ARTERIAL DUPLEX  Result Date: 02/07/2020 LOWER EXTREMITY ARTERIAL DUPLEX STUDY  Current ABI: Noncompressible pedal arteries bilaterally. 02/06/2020 Comparison Study: No prior study Performing Technologist: Maudry Mayhew MHA, RDMS, RVT, RDCS  Examination Guidelines: A complete evaluation includes B-mode imaging, spectral Doppler, color Doppler, and power Doppler as needed of all accessible portions of each vessel. Bilateral testing is considered an integral part of a complete examination. Limited examinations for reoccurring indications may be performed as  noted.  +-----------+--------+-----+--------+----------+--------+ RIGHT      PSV cm/sRatioStenosisWaveform  Comments +-----------+--------+-----+--------+----------+--------+ CFA Distal 74                   triphasic          +-----------+--------+-----+--------+----------+--------+ DFA        52                   biphasic           +-----------+--------+-----+--------+----------+--------+ SFA Prox   65                   triphasic          +-----------+--------+-----+--------+----------+--------+ SFA Mid    68                   triphasic          +-----------+--------+-----+--------+----------+--------+ SFA Distal 70                   triphasic          +-----------+--------+-----+--------+----------+--------+  POP Distal 66                   triphasic          +-----------+--------+-----+--------+----------+--------+ TP Trunk   82                   triphasic          +-----------+--------+-----+--------+----------+--------+ ATA Distal 64                   monophasic         +-----------+--------+-----+--------+----------+--------+ PTA Distal 51                   monophasic         +-----------+--------+-----+--------+----------+--------+ PERO Distal45                   monophasic         +-----------+--------+-----+--------+----------+--------+  +-----------+--------+-----+--------+----------+--------+ LEFT       PSV cm/sRatioStenosisWaveform  Comments +-----------+--------+-----+--------+----------+--------+ CFA Distal 69                   triphasic          +-----------+--------+-----+--------+----------+--------+ DFA        54                   triphasic          +-----------+--------+-----+--------+----------+--------+ SFA Prox   71                   triphasic          +-----------+--------+-----+--------+----------+--------+ SFA Mid    63                   triphasic           +-----------+--------+-----+--------+----------+--------+ SFA Distal 64                   triphasic          +-----------+--------+-----+--------+----------+--------+ POP Distal 68                   triphasic          +-----------+--------+-----+--------+----------+--------+ TP Trunk   69                   monophasic         +-----------+--------+-----+--------+----------+--------+ ATA Distal 64                   triphasic          +-----------+--------+-----+--------+----------+--------+ PTA Prox   26                   monophasic         +-----------+--------+-----+--------+----------+--------+ PTA Mid    20                   monophasic         +-----------+--------+-----+--------+----------+--------+ PTA Distal                      absent             +-----------+--------+-----+--------+----------+--------+ PERO Distal62                   triphasic          +-----------+--------+-----+--------+----------+--------+  Summary: Bilateral: No evidence of hemodynamically significant stenosis involving the visualized arteries of bilateral lower extremities. Waveforms are diminished distally in bilateral lower  extremities.  See table(s) above for measurements and observations.    Preliminary     Scheduled Meds: . allopurinol  100 mg Oral Daily  . apixaban  5 mg Oral Q12H  . atorvastatin  20 mg Oral QHS  . calcitRIOL  0.25 mcg Oral Daily  . cefdinir  300 mg Oral Q24H  . cholecalciferol  1,000 Units Oral Daily  . feeding supplement (GLUCERNA SHAKE)  237 mL Oral TID BM  . feeding supplement (PRO-STAT SUGAR FREE 64)  30 mL Oral BID  . insulin aspart  0-5 Units Subcutaneous QHS  . insulin aspart  0-9 Units Subcutaneous TID WC  . midodrine  10 mg Oral TID WC  . multivitamin with minerals  1 tablet Oral Daily  . nortriptyline  25 mg Oral QHS  . pantoprazole  40 mg Oral Daily  . phenylephrine  1 suppository Rectal BID  . phosphorus  1 tablet Oral 3 times  per day  . polyethylene glycol  17 g Oral Daily  . predniSONE  50 mg Oral Q breakfast  . senna-docusate  1 tablet Oral BID  . tacrolimus  1.5 mg Oral BID   Continuous Infusions:  PRN Meds: acetaminophen, bisacodyl, famotidine, HYDROmorphone, ondansetron **OR** ondansetron (ZOFRAN) IV, witch hazel-glycerin  Time spent: 35 minutes  Author: Berle Mull, MD [facility] 02/07/2020 5:59 PM  To reach On-call, see care teams to locate the attending and reach out to them via [email or web address]. If 7PM-7AM, please contact night-coverage If you still have difficulty reaching the attending provider, please page the [facility] (Director on Call) for [facility] on amion for assistance.

## 2020-02-07 NOTE — Progress Notes (Signed)
Bilateral lower extremity arterial duplex completed. Refer to "CV Proc" under chart review to view preliminary results.  02/07/2020 10:59 AM Kelby Aline., MHA, RVT, RDCS, RDMS

## 2020-02-08 ENCOUNTER — Inpatient Hospital Stay (HOSPITAL_COMMUNITY): Payer: Medicare HMO

## 2020-02-08 DIAGNOSIS — I313 Pericardial effusion (noninflammatory): Secondary | ICD-10-CM

## 2020-02-08 LAB — ECHOCARDIOGRAM LIMITED
Height: 69 in
Weight: 2860.69 oz

## 2020-02-08 LAB — GLUCOSE, CAPILLARY
Glucose-Capillary: 218 mg/dL — ABNORMAL HIGH (ref 70–99)
Glucose-Capillary: 190 mg/dL — ABNORMAL HIGH (ref 70–99)
Glucose-Capillary: 215 mg/dL — ABNORMAL HIGH (ref 70–99)
Glucose-Capillary: 196 mg/dL — ABNORMAL HIGH (ref 70–99)

## 2020-02-08 LAB — URINALYSIS, COMPLETE (UACMP) WITH MICROSCOPIC
Bilirubin Urine: NEGATIVE
Glucose, UA: >=500 mg/dL — AB
Ketones, ur: NEGATIVE mg/dL
Nitrite: NEGATIVE
Protein, ur: 30 mg/dL — AB
Specific Gravity, Urine: 1.016 (ref 1.005–1.030)
pH: 5 (ref 5.0–8.0)

## 2020-02-08 LAB — RENAL FUNCTION PANEL
Albumin: 1.5 g/dL — ABNORMAL LOW (ref 3.5–5.0)
Anion gap: 9 (ref 5–15)
BUN: 40 mg/dL — ABNORMAL HIGH (ref 6–20)
CO2: 20 mmol/L — ABNORMAL LOW (ref 22–32)
Calcium: 7.8 mg/dL — ABNORMAL LOW (ref 8.9–10.3)
Chloride: 111 mmol/L (ref 98–111)
Creatinine, Ser: 3.2 mg/dL — ABNORMAL HIGH (ref 0.44–1.00)
GFR calc Af Amer: 18 mL/min — ABNORMAL LOW (ref >60)
GFR calc non Af Amer: 15 mL/min — ABNORMAL LOW (ref >60)
Glucose, Bld: 226 mg/dL — ABNORMAL HIGH (ref 70–99)
Phosphorus: 4.3 mg/dL (ref 2.5–4.6)
Potassium: 3.5 mmol/L (ref 3.5–5.1)
Sodium: 140 mmol/L (ref 135–145)

## 2020-02-08 LAB — PROTEIN / CREATININE RATIO, URINE
Creatinine, Urine: 99.14 mg/dL
Protein Creatinine Ratio: 0.92 mg/mg{Cre} — ABNORMAL HIGH (ref 0.00–0.15)
Total Protein, Urine: 91 mg/dL

## 2020-02-08 LAB — T4, FREE: Free T4: 0.63 ng/dL (ref 0.61–1.12)

## 2020-02-08 LAB — TSH: TSH: 3.81 u[IU]/mL (ref 0.350–4.500)

## 2020-02-08 LAB — VITAMIN B12: Vitamin B-12: 1694 pg/mL — ABNORMAL HIGH (ref 180–914)

## 2020-02-08 LAB — SODIUM, URINE, RANDOM: Sodium, Ur: 19 mmol/L

## 2020-02-08 LAB — FOLATE: Folate: 13 ng/mL (ref >5.9)

## 2020-02-08 LAB — CREATININE, URINE, RANDOM: Creatinine, Urine: 97.77 mg/dL

## 2020-02-08 MED ORDER — PREDNISONE 10 MG PO TABS: 10.0000 mg | ORAL_TABLET | Freq: Every day | ORAL | Status: DC

## 2020-02-08 MED ORDER — PREDNISONE 20 MG PO TABS
40.0000 mg | ORAL_TABLET | Freq: Every day | ORAL | Status: AC
  Administered 2020-02-08: 40 mg via ORAL
  Filled 2020-02-08: qty 2

## 2020-02-08 MED ORDER — HYDROCORTISONE (PERIANAL) 2.5 % EX CREA
TOPICAL_CREAM | Freq: Three times a day (TID) | CUTANEOUS | Status: DC
  Filled 2020-02-08: qty 28.35

## 2020-02-08 MED ORDER — PREDNISONE 20 MG PO TABS
30.0000 mg | ORAL_TABLET | Freq: Every day | ORAL | Status: AC
  Administered 2020-02-09: 30 mg via ORAL
  Filled 2020-02-08: qty 1

## 2020-02-08 MED ORDER — MIDODRINE HCL 5 MG PO TABS
2.5000 mg | ORAL_TABLET | Freq: Three times a day (TID) | ORAL | Status: DC
  Administered 2020-02-08 (×2): 2.5 mg via ORAL
  Filled 2020-02-08 (×2): qty 1

## 2020-02-08 MED ORDER — SIMETHICONE 80 MG PO CHEW
80.0000 mg | CHEWABLE_TABLET | Freq: Four times a day (QID) | ORAL | Status: DC
  Administered 2020-02-08 – 2020-02-11 (×12): 80 mg via ORAL
  Filled 2020-02-08 (×12): qty 1

## 2020-02-08 MED ORDER — PREDNISONE 20 MG PO TABS
20.0000 mg | ORAL_TABLET | Freq: Every day | ORAL | Status: AC
  Administered 2020-02-10 – 2020-02-11 (×2): 20 mg via ORAL
  Filled 2020-02-08 (×2): qty 1

## 2020-02-08 MED ORDER — PREDNISONE 20 MG PO TABS: 40.0000 mg | ORAL_TABLET | Freq: Every day | ORAL | Status: DC

## 2020-02-08 MED ORDER — CEFDINIR 300 MG PO CAPS
300.0000 mg | ORAL_CAPSULE | ORAL | Status: AC
  Administered 2020-02-08 – 2020-02-11 (×4): 300 mg via ORAL
  Filled 2020-02-08 (×4): qty 1

## 2020-02-08 NOTE — Progress Notes (Signed)
  Echocardiogram 2D Echocardiogram has been performed.  Matilde Bash 02/08/2020, 11:58 AM

## 2020-02-08 NOTE — Progress Notes (Addendum)
[facility] Progress Note  Patient: Wanda [name]    STM:[id number]  DOA: 02/04/2020     Date of Service: the patient was seen and examined on 02/08/2020  Chief Complaint  Patient presents with  . Hypotension   Brief hospital course: RELENA IVANCIC is a 59 y.o. female with medical history significant of T2DM, HTN, Paroxysmal A. Fib, HLD, ESRD with failed kidney transplant 2013 and repeat transplant September 2020 at [facility], microcytic anemia, Hx of gastric sleeve who presents with dysuria and hypotension.  Currently further plan is continue antibiotics.  Assessment and Plan: 1.  Sepsis POA. Secondary to UTI due to Klebsiella oxytoca as well as Enterococcus  Initially on IV antibiotics.   change to oral antibiotics.  1 dose of fosfomycin. Bactrim currently on hold. Blood pressure improving.  Monitor.  2.  Hypotension Blood pressure improving Lactic acid not elevated. IV albumin as well as 1 L of IV bolus was given Added midodrine.  Will reduce the dose, check orthostatic vitals Monitor on telemetry. Patient was given stress dose steroids.  3.  Anasarca. Hypoalbuminemia. Moderate pericardial effusion without any tamponade. Bilateral pleural effusion. Suspect this is all in the setting of third spacing from hypoalbuminemia. Currently echocardiogram does not show any evidence of tamponade. Monitor. Repeat echocardiogram to ensure stability  4.  ESRD SP renal transplant. Chronic immunosuppressive medication Acute kidney injury on chronic kidney disease stage III Anemia chronic kidney disease. Renal function worsening. No adequate documentation of urine output Nephrology consulted Appreciate assistance. Currently CellCept is on hold given concern for bacteremia and viremia.  5.  Paroxysmal A. fib Eliquis currently receiving. Metoprolol on hold.  6.  Type 2 diabetes mellitus with nephropathy and neuropathy. Continue gabapentin. Continue sliding scale  insulin. Monitor.  7.  Moderate protein calorie malnutrition. Failure to thrive Body mass index is 26.4 kg/m.  Nutrition Problem: Moderate Malnutrition Etiology: chronic illness(ESRD s/p transplant) Interventions: Interventions: Glucerna shake, MVI, Prostat   8.  Bilateral lower extremity edema. Bilateral lower extremity tenderness. Lower extremity ABI unable to be performed, bilateral pedal vessels are noncompressible with abnormal toe waveforms.  Will require vascular surgery follow-up outpatient. Lower extremity Dopplers ordered currently no evidence of DVT.  9.  Right lower lobe necrotic ulcer. Wound care consulted. Continue antibiotics. MRI foot as well as x-ray foot negative for any osteomyelitis and shows soft tissue edema. Monitor.  10.  Hemorrhoids. Constipation. Initiating bowel regimen. Continue to prep H as well as Anusol cream.  11.  Hypothyroidism versus sick euthyroid syndrome. We will recheck TSH and free T4.  Diet: Carb modified diet DVT Prophylaxis: Therapeutic Anticoagulation with Eliquis   Advance goals of care discussion: Full code  Family Communication: no family was present at bedside, at the time of interview.   Disposition:  Status is: Inpatient  Remains inpatient appropriate because:IV treatments appropriate due to intensity of illness or inability to take PO and Inpatient level of care appropriate due to severity of illness   Dispo: The patient is from: Home              Anticipated d/c is to: SNF              Anticipated d/c date is: > 3 days              Patient currently is not medically stable to d/c.  Subjective: Reports constipation as well as hemorrhoidal pain.  No nausea no vomiting.  No fever no chills.  Physical Exam: General:  alert oriented to time, place, and person.  Appear in mild distress, affect appropriate Eyes: PERRL ENT: Oral Mucosa Clear, moist  Neck: difficult to assess  JVD,  Cardiovascular: S1 and S2 Present,  no Murmur,  Respiratory: good respiratory effort, Bilateral Air entry equal and Decreased, no Crackles, no wheezes Abdomen: Bowel Sound present, Soft and no tenderness,  Skin: no rash Extremities: bilateral upper extremity edema,no pedal edema, no calf tenderness Neurologic: without any new focal findings Gait not checked due to patient safety concerns  Vitals:   02/07/20 2111 02/08/20 0302 02/08/20 0514 02/08/20 1602  BP: (!) 144/92  (!) 150/87 (!) 107/29  Pulse: 83  84 88  Resp: 16  16 20   Temp: 97.6 F (36.4 C)  (!) 97.5 F (36.4 C) 97.8 F (36.6 C)  TempSrc: Oral  Oral Oral  SpO2: 100%  100% 100%  Weight:  81.1 kg    Height:        Intake/Output Summary (Last 24 hours) at 02/08/2020 1740 Last data filed at 02/08/2020 1300 Gross per 24 hour  Intake 840 ml  Output 51 ml  Net 789 ml   Filed Weights   02/07/20 0108 02/07/20 0408 02/08/20 0302  Weight: 78.6 kg 78.6 kg 81.1 kg    Data Reviewed: I have personally reviewed and interpreted daily labs, tele strips, imagings as discussed above. I reviewed all nursing notes, pharmacy notes, vitals, pertinent old records I have discussed plan of care as described above with RN and patient/family.  CBC: Recent Labs  Lab 02/04/20 1255 02/05/20 0633 02/06/20 0443 02/07/20 0635  WBC 4.4 4.3 3.4* 3.7*  NEUTROABS  --   --   --  3.5  HGB 11.1* 11.0* 9.5* 10.1*  HCT 36.0 35.1* 30.8* 32.2*  MCV 108.1* 106.4* 106.6* 104.2*  PLT 237 239 235 779   Basic Metabolic Panel: Recent Labs  Lab 02/04/20 1315 02/05/20 0633 02/06/20 0443 02/07/20 0635 02/08/20 0621  NA 139 141 141 140 140  K 4.0 4.0 3.8 3.7 3.5  CL 112* 115* 112* 113* 111  CO2 19* 18* 18* 20* 20*  GLUCOSE 219* 133* 166* 232* 226*  BUN 28* 31* 30* 37* 40*  CREATININE 2.74* 2.75* 2.75* 2.95* 3.20*  CALCIUM 8.6* 7.9* 7.8* 7.5* 7.8*  MG  --  2.1  --   --   --   PHOS  --  2.5 3.2 3.9 4.3    Studies: ECHOCARDIOGRAM LIMITED  Result Date: 02/08/2020    ECHOCARDIOGRAM  LIMITED REPORT   Patient Name:   Wanda [name] Date of Exam: 02/08/2020 Medical Rec #:  [id number]         Height:       69.0 in Accession #:    [id number]        Weight:       178.8 lb Date of Birth:  1961/04/09        BSA:          1.970 m Patient Age:    38 years          BP:           150/87 mmHg Patient Gender: F                 HR:           84 bpm. Exam Location:  Inpatient Procedure: Limited Echo Indications:    Pericardial effusion  History:        Patient has prior history of Echocardiogram  examinations, most                 recent 02/05/2020. Transplant Complications, Pericardial Disease,                 Arrythmias:Atrial Fibrillation; Risk Factors:Diabetes,                 Hypertension and Dyslipidemia. Pericardial effusion, pleural                 effusion, Kidney transplant.  Sonographer:    Dustin Flock Referring Phys: [id number] [place]  1. Left ventricular ejection fraction, by estimation, is 70 to 75%. The left ventricle has hyperdynamic function. There is severe concentric left ventricular hypertrophy.  2. Right ventricular systolic function is normal. The right ventricular size is normal.  3. The pericardial effusion is moderate - large in sice . It is primarily located posteriorly. There is no evidence of pericardial tamponade. . Large pericardial effusion. There is no evidence of cardiac tamponade. FINDINGS  Left Ventricle: Left ventricular ejection fraction, by estimation, is 70 to 75%. The left ventricle has hyperdynamic function. There is severe concentric left ventricular hypertrophy. Right Ventricle: The right ventricular size is normal. No increase in right ventricular wall thickness. Right ventricular systolic function is normal. Pericardium: The pericardial effusion is moderate - large in sice . It is primarily located posteriorly. There is no evidence of pericardial tamponade. A large pericardial effusion is present. There is no evidence of cardiac tamponade.  Mertie Moores MD Electronically signed by Mertie Moores MD Signature Date/Time: 02/08/2020/1:53:18 PM    Final     Scheduled Meds: . allopurinol  100 mg Oral Daily  . apixaban  5 mg Oral Q12H  . atorvastatin  20 mg Oral QHS  . calcitRIOL  0.25 mcg Oral Daily  . cefdinir  300 mg Oral Q24H  . cholecalciferol  1,000 Units Oral Daily  . feeding supplement (GLUCERNA SHAKE)  237 mL Oral TID BM  . feeding supplement (PRO-STAT SUGAR FREE 64)  30 mL Oral BID  . hydrocortisone   Rectal TID  . insulin aspart  0-5 Units Subcutaneous QHS  . insulin aspart  0-9 Units Subcutaneous TID WC  . midodrine  2.5 mg Oral TID WC  . multivitamin with minerals  1 tablet Oral Daily  . nortriptyline  25 mg Oral QHS  . pantoprazole  40 mg Oral Daily  . phenylephrine  1 suppository Rectal BID  . phosphorus  1 tablet Oral 3 times per day  . polyethylene glycol  17 g Oral Daily  . [START ON 02/09/2020] predniSONE  30 mg Oral Q breakfast   Followed by  . [START ON 02/10/2020] predniSONE  20 mg Oral Q breakfast   Followed by  . [START ON 02/12/2020] predniSONE  10 mg Oral Q breakfast  . senna-docusate  1 tablet Oral BID  . simethicone  80 mg Oral QID  . tacrolimus  1.5 mg Oral BID   Continuous Infusions:  PRN Meds: acetaminophen, bisacodyl, famotidine, HYDROmorphone, ondansetron **OR** ondansetron (ZOFRAN) IV, witch hazel-glycerin  Time spent: 35 minutes  Author: Berle Mull, MD [facility] 02/08/2020 5:40 PM  To reach On-call, see care teams to locate the attending and reach out to them via [email or web address]. If 7PM-7AM, please contact night-coverage If you still have difficulty reaching the attending provider, please page the [facility] (Director on Call) for [facility] on amion for assistance.

## 2020-02-08 NOTE — Progress Notes (Signed)
Patient ID: Wanda [name], female   DOB: [birth year], 59 y.o.   MRN: [id number] S: Feels better and was up in a chair yesterday. O:BP (!) 150/87   Pulse 84   Temp (!) 97.5 F (36.4 C) (Oral)   Resp 16   Ht 5\' 9"  (1.753 m)   Wt 81.1 kg   SpO2 100%   BMI 26.40 kg/m   Intake/Output Summary (Last 24 hours) at 02/08/2020 1110 Last data filed at 02/08/2020 0900 Gross per 24 hour  Intake 840 ml  Output 50 ml  Net 790 ml   Intake/Output: I/O last 3 completed shifts: In: 600 [P.O.:600] Out: 200 [Urine:200]  Intake/Output this shift:  Total I/O In: 240 [P.O.:240] Out: 50 [Urine:50] Weight change: 2.5 kg Gen: NAD CVS: RRR, no rub Resp: cta Abd: +BS,soft, NT/nd Ext: trace pretibial edema, RUE AVF +T/B  Recent Labs  Lab 02/04/20 1315 02/05/20 0633 02/06/20 0443 02/07/20 0635 02/08/20 0621  NA 139 141 141 140 140  K 4.0 4.0 3.8 3.7 3.5  CL 112* 115* 112* 113* 111  CO2 19* 18* 18* 20* 20*  GLUCOSE 219* 133* 166* 232* 226*  BUN 28* 31* 30* 37* 40*  CREATININE 2.74* 2.75* 2.75* 2.95* 3.20*  ALBUMIN 1.6* 1.2* 1.6* 1.5* 1.5*  CALCIUM 8.6* 7.9* 7.8* 7.5* 7.8*  PHOS  --  2.5 3.2 3.9 4.3  AST 24 58*  --   --   --   ALT 19 26  --   --   --    Liver Function Tests: Recent Labs  Lab 02/04/20 1315 02/04/20 1315 02/05/20 0633 02/05/20 0633 02/06/20 0443 02/07/20 0635 02/08/20 0621  AST 24  --  58*  --   --   --   --   ALT 19  --  26  --   --   --   --   ALKPHOS 484*  --  660*  --   --   --   --   BILITOT 0.9  --  0.5  --   --   --   --   PROT 5.2*  --  4.1*  --   --   --   --   ALBUMIN 1.6*   < > 1.2*   < > 1.6* 1.5* 1.5*   < > = values in this interval not displayed.   No results for input(s): LIPASE, AMYLASE in the last 168 hours. No results for input(s): AMMONIA in the last 168 hours. CBC: Recent Labs  Lab 02/04/20 1255 02/04/20 1255 02/05/20 0633 02/06/20 0443 02/07/20 0635  WBC 4.4   < > 4.3 3.4* 3.7*  NEUTROABS  --   --   --   --  3.5  HGB 11.1*   < >  11.0* 9.5* 10.1*  HCT 36.0   < > 35.1* 30.8* 32.2*  MCV 108.1*  --  106.4* 106.6* 104.2*  PLT 237   < > 239 235 292   < > = values in this interval not displayed.   Cardiac Enzymes: No results for input(s): CKTOTAL, CKMB, CKMBINDEX, TROPONINI in the last 168 hours. CBG: Recent Labs  Lab 02/07/20 0629 02/07/20 1207 02/07/20 1650 02/07/20 2129 02/08/20 0639  GLUCAP 207* 211* 188* 198* 218*    Iron Studies: No results for input(s): IRON, TIBC, TRANSFERRIN, FERRITIN in the last 72 hours. Studies/Results: MR FOOT RIGHT WO CONTRAST  Result Date: 02/06/2020 CLINICAL DATA:  Hindfoot ulceration. Concern for osteomyelitis EXAM: MRI OF THE RIGHT HINDFOOT  WITHOUT CONTRAST TECHNIQUE: Multiplanar, multisequence MR imaging of the ankle was performed. No intravenous contrast was administered. COMPARISON:  X-ray 02/05/2020 FINDINGS: Diffuse soft tissue edema. Mild skin irregularity at the posterior aspect of the ankle and hindfoot. No deep soft tissue ulceration. No well-defined fluid collection. TENDONS Peroneal: Mild tendinosis and possible short-segment split tear of the infra malleolar portion of the peroneus brevis tendon. Peroneus longus tendon intact. Trace tenosynovial fluid. Posteromedial: Intact tibialis posterior, flexor hallucis longus and flexor digitorum longus tendons. Anterior: Intact tibialis anterior, extensor hallucis longus and extensor digitorum longus tendons. Achilles: Intact. Plantar Fascia: Intact. LIGAMENTS Lateral: The anterior and posterior tibiofibular ligaments are intact. The anterior and posterior talofibular ligaments are intact. Intact calcaneofibular ligament. Medial: Intact. CARTILAGE Ankle Joint: Anterior translation of the talar dome relative to the tibial plafond without dislocation. No focal chondral defect. Trace tibiotalar joint effusion. Subtalar Joints/Sinus Tarsi: No joint effusion or chondral defect. Bones: No acute fracture. No dislocation. No bone marrow edema. No  cortical destruction. No suspicious bone lesion. Other: Diffuse muscular atrophy and fatty infiltration suggesting chronic denervation changes. Edema like intramuscular signal may also reflect denervation or myositis. IMPRESSION: 1. Negative for acute osteomyelitis. 2. Nonspecific soft tissue edema. No deep soft tissue ulceration. No well-defined fluid collection. 3. Chronic denervation changes of the intrinsic musculature of the right foot. Mild diffuse intramuscular edema which could reflect a nonspecific myositis. 4. Mild tendinosis and possible short-segment split tear of the infra-malleolar portion of the peroneus brevis tendon. Electronically Signed   By: Davina Poke D.O.   On: 02/06/2020 11:48   VAS [place] LOWER EXTREMITY ARTERIAL DUPLEX  Result Date: 02/07/2020 LOWER EXTREMITY ARTERIAL DUPLEX STUDY  Current ABI: Noncompressible pedal arteries bilaterally. 02/06/2020 Comparison Study: No prior study Performing Technologist: Maudry Mayhew MHA, RDMS, RVT, RDCS  Examination Guidelines: A complete evaluation includes B-mode imaging, spectral Doppler, color Doppler, and power Doppler as needed of all accessible portions of each vessel. Bilateral testing is considered an integral part of a complete examination. Limited examinations for reoccurring indications may be performed as noted.  +-----------+--------+-----+--------+----------+--------+ RIGHT      PSV cm/sRatioStenosisWaveform  Comments +-----------+--------+-----+--------+----------+--------+ CFA Distal 74                   triphasic          +-----------+--------+-----+--------+----------+--------+ DFA        52                   biphasic           +-----------+--------+-----+--------+----------+--------+ SFA Prox   65                   triphasic          +-----------+--------+-----+--------+----------+--------+ SFA Mid    68                   triphasic           +-----------+--------+-----+--------+----------+--------+ SFA Distal 70                   triphasic          +-----------+--------+-----+--------+----------+--------+ POP Distal 66                   triphasic          +-----------+--------+-----+--------+----------+--------+ TP Trunk   82                   triphasic          +-----------+--------+-----+--------+----------+--------+  ATA Distal 64                   monophasic         +-----------+--------+-----+--------+----------+--------+ PTA Distal 51                   monophasic         +-----------+--------+-----+--------+----------+--------+ PERO Distal45                   monophasic         +-----------+--------+-----+--------+----------+--------+  +-----------+--------+-----+--------+----------+--------+ LEFT       PSV cm/sRatioStenosisWaveform  Comments +-----------+--------+-----+--------+----------+--------+ CFA Distal 69                   triphasic          +-----------+--------+-----+--------+----------+--------+ DFA        54                   triphasic          +-----------+--------+-----+--------+----------+--------+ SFA Prox   71                   triphasic          +-----------+--------+-----+--------+----------+--------+ SFA Mid    63                   triphasic          +-----------+--------+-----+--------+----------+--------+ SFA Distal 64                   triphasic          +-----------+--------+-----+--------+----------+--------+ POP Distal 68                   triphasic          +-----------+--------+-----+--------+----------+--------+ TP Trunk   69                   monophasic         +-----------+--------+-----+--------+----------+--------+ ATA Distal 64                   triphasic          +-----------+--------+-----+--------+----------+--------+ PTA Prox   26                   monophasic          +-----------+--------+-----+--------+----------+--------+ PTA Mid    20                   monophasic         +-----------+--------+-----+--------+----------+--------+ PTA Distal                      absent             +-----------+--------+-----+--------+----------+--------+ PERO Distal62                   triphasic          +-----------+--------+-----+--------+----------+--------+  Summary: Bilateral: No evidence of hemodynamically significant stenosis involving the visualized arteries of bilateral lower extremities. Waveforms are diminished distally in bilateral lower extremities.  See table(s) above for measurements and observations. Electronically signed by Deitra Mayo MD on 02/07/2020 at 7:24:40 PM.    Final    . allopurinol  100 mg Oral Daily  . apixaban  5 mg Oral Q12H  . atorvastatin  20 mg Oral QHS  . calcitRIOL  0.25 mcg Oral Daily  . cefdinir  300 mg Oral Q24H  .  cholecalciferol  1,000 Units Oral Daily  . feeding supplement (GLUCERNA SHAKE)  237 mL Oral TID BM  . feeding supplement (PRO-STAT SUGAR FREE 64)  30 mL Oral BID  . hydrocortisone   Rectal TID  . insulin aspart  0-5 Units Subcutaneous QHS  . insulin aspart  0-9 Units Subcutaneous TID WC  . midodrine  2.5 mg Oral TID WC  . multivitamin with minerals  1 tablet Oral Daily  . nortriptyline  25 mg Oral QHS  . pantoprazole  40 mg Oral Daily  . phenylephrine  1 suppository Rectal BID  . phosphorus  1 tablet Oral 3 times per day  . polyethylene glycol  17 g Oral Daily  . [START ON 02/09/2020] predniSONE  30 mg Oral Q breakfast   Followed by  . [START ON 02/10/2020] predniSONE  20 mg Oral Q breakfast   Followed by  . [START ON 02/12/2020] predniSONE  10 mg Oral Q breakfast  . senna-docusate  1 tablet Oral BID  . simethicone  80 mg Oral QID  . tacrolimus  1.5 mg Oral BID    BMET    Component Value Date/Time   NA 140 02/08/2020 0621   K 3.5 02/08/2020 0621   CL 111 02/08/2020 0621   CO2 20 (L)  02/08/2020 0621   GLUCOSE 226 (H) 02/08/2020 0621   BUN 40 (H) 02/08/2020 0621   CREATININE 3.20 (H) 02/08/2020 0621   CREATININE 8.80 (H) 07/10/2014 1332   CALCIUM 7.8 (L) 02/08/2020 0621   GFRNONAA 15 (L) 02/08/2020 0621   GFRAA 18 (L) 02/08/2020 0621   CBC    Component Value Date/Time   WBC 3.7 (L) 02/07/2020 0635   RBC 3.09 (L) 02/07/2020 0635   HGB 10.1 (L) 02/07/2020 0635   HGB 10.5 (L) 08/03/2010 1119   HCT 32.2 (L) 02/07/2020 0635   HCT 30.7 (L) 08/03/2010 1119   PLT 292 02/07/2020 0635   PLT 277 08/03/2010 1119   MCV 104.2 (H) 02/07/2020 0635   MCV 84.6 08/03/2010 1119   MCH 32.7 02/07/2020 0635   MCHC 31.4 02/07/2020 0635   RDW 16.2 (H) 02/07/2020 0635   RDW 13.4 08/03/2010 1119   LYMPHSABS 0.2 (L) 02/07/2020 0635   LYMPHSABS 2.5 08/03/2010 1119   MONOABS 0.0 (L) 02/07/2020 0635   MONOABS 0.4 08/03/2010 1119   EOSABS 0.0 02/07/2020 0635   EOSABS 0.2 08/03/2010 1119   BASOSABS 0.0 02/07/2020 0635   BASOSABS 0.1 08/03/2010 1119    Assessment/Plan: 1. AKI/CKD stage IIIa- likely due to ischemic ATN in setting of volume depletion, hypotension, and recurrent Urosepsis. Baseline Scr appears to be 1.6-1.8. Continue with supportive care and follow UOP and Scr.  1. UOP has dropped despite increased BP 2. Check FeNa and bladder scan, if PVR >300 ml place foley 3. No indication for HD at this time 4. Hope to see some improvement of renal function with improved BP soon. 5. May benefit from IV albumin given malnutrition. 2. S/p DDKT with chronic immunosuppressive agents- continue with prograf 1.5 mg bid and agree with stress dose steroids given hypotension and acute infection. 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 She also drives to [facility] every month for IV Belatacept.She would like to transfer to a closer transplant center ([facility] does administer IV Belatacept) 3. Hypotension/Shock- hypovolemic +/- urosepsis- BP improved with IVF"s and stress dose  steroids 1. Agree with decreasing midodrine dose due to elevated bp. 4. Recurrent UTI's and infections- currently on  IV Ceftriaxone. Urine culture + for Klebsiella >100,000 cfu,sensitive to ceftriaxone,  Enterococcus 40,000 cfu sensitive only to vanc and imipenem.  Abx per primary 5. Severe protein malnutrition- albumin 1.2-1.6. Protein supplements and encourage po intake. 6. Pleural effusions and pericardial effusion on CT scan- unclear etiologybut likely due to 3rd spacing from hypoalbuminemia. IVF"s held but not consistent with CHF given her hypotension. ECHOwith EF of 70-75%, Grade I DD, elevated left atrial pressure, normal RV, moderate pericardial effusion. 7. P. Atrial fibrillation- continue metoprolol and apixaban. 8. Type II DM- per primary 9. Hypothyroidism- TSH markedly elevated. Start replacement therapy. 10. Severe deconditioning and debility- will likely require SNF and PT/OT 11. Right heel decubitus ulcer- since her last hospitalization she has had progressive debility and is now wheelchair bound. Wound care following. No evidence of osteo.  12. Disposition- will likely need SNF placement vs. CIR  Donetta Potts, MD [facility] [phone]

## 2020-02-08 NOTE — Progress Notes (Signed)
Bladder scan complete with 4 ml scanned patient has pitting edema in thighs and abdomen with no diuretics ordered.

## 2020-02-08 NOTE — Progress Notes (Signed)
Patient continues to poor appetite  with bad taste but successful with ensures.

## 2020-02-08 NOTE — Progress Notes (Signed)
Patient given suppository for bowel movement waited 53mins and put on bedpan.

## 2020-02-09 LAB — GLUCOSE, CAPILLARY
Glucose-Capillary: 213 mg/dL — ABNORMAL HIGH (ref 70–99)
Glucose-Capillary: 160 mg/dL — ABNORMAL HIGH (ref 70–99)
Glucose-Capillary: 163 mg/dL — ABNORMAL HIGH (ref 70–99)
Glucose-Capillary: 219 mg/dL — ABNORMAL HIGH (ref 70–99)

## 2020-02-09 LAB — RENAL FUNCTION PANEL
Albumin: 1.8 g/dL — ABNORMAL LOW (ref 3.5–5.0)
Anion gap: 13 (ref 5–15)
BUN: 44 mg/dL — ABNORMAL HIGH (ref 6–20)
CO2: 19 mmol/L — ABNORMAL LOW (ref 22–32)
Calcium: 7.8 mg/dL — ABNORMAL LOW (ref 8.9–10.3)
Chloride: 109 mmol/L (ref 98–111)
Creatinine, Ser: 3.15 mg/dL — ABNORMAL HIGH (ref 0.44–1.00)
GFR calc Af Amer: 18 mL/min — ABNORMAL LOW (ref >60)
GFR calc non Af Amer: 15 mL/min — ABNORMAL LOW (ref >60)
Glucose, Bld: 177 mg/dL — ABNORMAL HIGH (ref 70–99)
Phosphorus: 4.3 mg/dL (ref 2.5–4.6)
Potassium: 4.1 mmol/L (ref 3.5–5.1)
Sodium: 141 mmol/L (ref 135–145)

## 2020-02-09 LAB — CBC
HCT: 37.1 % (ref 36.0–46.0)
Hemoglobin: 11.8 g/dL — ABNORMAL LOW (ref 12.0–15.0)
MCH: 32.3 pg (ref 26.0–34.0)
MCHC: 31.8 g/dL (ref 30.0–36.0)
MCV: 101.6 fL — ABNORMAL HIGH (ref 80.0–100.0)
Platelets: 330 10*3/uL (ref 150–400)
RBC: 3.65 MIL/uL — ABNORMAL LOW (ref 3.87–5.11)
RDW: 15.9 % — ABNORMAL HIGH (ref 11.5–15.5)
WBC: 4.9 10*3/uL (ref 4.0–10.5)
nRBC: 0 % (ref 0.0–0.2)

## 2020-02-09 LAB — PHOSPHORUS: Phosphorus: 4.3 mg/dL (ref 2.5–4.6)

## 2020-02-09 LAB — CULTURE, BLOOD (ROUTINE X 2): Culture: NO GROWTH

## 2020-02-09 NOTE — Plan of Care (Signed)
  Problem: Education: Goal: Knowledge of General Education information will improve Description: Including pain rating scale, medication(s)/side effects and non-pharmacologic comfort measures Outcome: Progressing   Problem: Health Behavior/Discharge Planning: Goal: Ability to manage health-related needs will improve Outcome: Progressing   Problem: Clinical Measurements: Goal: Will remain free from infection Outcome: Progressing   Problem: Coping: Goal: Level of anxiety will decrease Outcome: Progressing   Problem: Elimination: Goal: Will not experience complications related to bowel motility Outcome: Progressing   

## 2020-02-09 NOTE — Progress Notes (Signed)
[facility] Progress Note  Patient: Wanda [name]    HWE:[id number]  DOA: 02/04/2020     Date of Service: the patient was seen and examined on 02/09/2020  Chief Complaint  Patient presents with  . Hypotension   Brief hospital course: Wanda [name] is a 59 y.o. female with medical history significant of T2DM, HTN, Paroxysmal A. Fib, HLD, ESRD with failed kidney transplant 2013 and repeat transplant September 2020 at [facility], microcytic anemia, Hx of gastric sleeve who presents with dysuria and hypotension.  Currently further plan is continue antibiotics.  Assessment and Plan: 1.  Sepsis POA. Secondary to UTI due to Klebsiella oxytoca as well as Enterococcus  Initially on IV antibiotics.   change to oral antibiotics. 1 dose of fosfomycin. Bactrim currently on hold. Blood pressure improving.  Monitor.  2.  Hypotension resolved Blood pressure improving Lactic acid not elevated. IV albumin as well as 1 L of IV bolus was given Added midodrine.  Monitor on telemetry. Patient was given stress dose steroids.  3.  Anasarca. Hypoalbuminemia. Moderate pericardial effusion without any tamponade. Bilateral pleural effusion. Suspect this is all in the setting of third spacing from hypoalbuminemia. Currently echocardiogram does not show any evidence of tamponade. Monitor. Repeat echocardiogram shows persistent moderate pericardial effusion.  No evidence of tamponade. We will discuss with cardiology prior to discharge for follow-up.  4.  ESRD SP renal transplant. Chronic immunosuppressive medication Acute kidney injury on chronic kidney disease stage III Anemia chronic kidney disease. Renal function worsening. No adequate documentation of urine output Nephrology consulted Appreciate assistance. Currently CellCept is on hold given concern for bacteremia and viremia.  5.  Paroxysmal A. fib Eliquis currently receiving. Metoprolol on hold.  6.  Type 2 diabetes mellitus with  nephropathy and neuropathy. Continue gabapentin. Continue sliding scale insulin. Monitor.  7.  Moderate protein calorie malnutrition. Failure to thrive Body mass index is 25.91 kg/m.  Nutrition Problem: Moderate Malnutrition Etiology: chronic illness(ESRD s/p transplant) Interventions: Interventions: Glucerna shake, MVI, Prostat   8.  Bilateral lower extremity edema. Bilateral lower extremity tenderness. Lower extremity ABI unable to be performed, bilateral pedal vessels are noncompressible with abnormal toe waveforms.  Will require vascular surgery follow-up outpatient. Lower extremity Dopplers ordered currently no evidence of DVT.  9.  Right lower lobe necrotic ulcer. Wound care consulted. Continue antibiotics. MRI foot as well as x-ray foot negative for any osteomyelitis and shows soft tissue edema. Monitor.  10.  Hemorrhoids. Constipation. Initiating bowel regimen. Continue to prep H as well as Anusol cream.  11.  sick euthyroid syndrome. Repeat TSH and free T4 normal.  No indication for replacement.  Diet: Carb modified diet DVT Prophylaxis: Therapeutic Anticoagulation with Eliquis   Advance goals of care discussion: Full code  Family Communication: no family was present at bedside, at the time of interview.   Disposition:  Status is: Inpatient  Remains inpatient appropriate because:IV treatments appropriate due to intensity of illness or inability to take PO and Inpatient level of care appropriate due to severity of illness   Dispo: The patient is from: Home              Anticipated d/c is to: SNF              Anticipated d/c date is: > 3 days              Patient currently is not medically stable to d/c.  Subjective: Constipation resolved.  No nausea no vomiting.  No further  pain in the rectum area.  No fever no chills.  Physical Exam: General:  alert oriented to time, place, and person.  Appear in mild distress, affect appropriate Eyes: PERRL ENT: Oral  Mucosa Clear, moist  Neck: difficult to assess  JVD,  Cardiovascular: S1 and S2 Present, no Murmur,  Respiratory: good respiratory effort, Bilateral Air entry equal and Decreased, no Crackles, no wheezes Abdomen: Bowel Sound present, Soft and no tenderness,  Skin: no rash Extremities: bilateral upper extremity edema,no pedal edema, no calf tenderness Neurologic: without any new focal findings Gait not checked due to patient safety concerns  Vitals:   02/08/20 0514 02/08/20 1602 02/08/20 2010 02/09/20 0444  BP: (!) 150/87 (!) 107/29 134/81 (!) 157/87  Pulse: 84 88 88 88  Resp: 16 20 18 18   Temp: (!) 97.5 F (36.4 C) 97.8 F (36.6 C) 98.1 F (36.7 C) 97.7 F (36.5 C)  TempSrc: Oral Oral Oral Oral  SpO2: 100% 100% 100% 100%  Weight:    79.6 kg  Height:        Intake/Output Summary (Last 24 hours) at 02/09/2020 1309 Last data filed at 02/09/2020 1032 Gross per 24 hour  Intake 480 ml  Output 251 ml  Net 229 ml   Filed Weights   02/07/20 0408 02/08/20 0302 02/09/20 0444  Weight: 78.6 kg 81.1 kg 79.6 kg    Data Reviewed: I have personally reviewed and interpreted daily labs, tele strips, imagings as discussed above. I reviewed all nursing notes, pharmacy notes, vitals, pertinent old records I have discussed plan of care as described above with RN and patient/family.  CBC: Recent Labs  Lab 02/04/20 1255 02/05/20 0633 02/06/20 0443 02/07/20 0635 02/09/20 1039  WBC 4.4 4.3 3.4* 3.7* 4.9  NEUTROABS  --   --   --  3.5  --   HGB 11.1* 11.0* 9.5* 10.1* 11.8*  HCT 36.0 35.1* 30.8* 32.2* 37.1  MCV 108.1* 106.4* 106.6* 104.2* 101.6*  PLT 237 239 235 292 144   Basic Metabolic Panel: Recent Labs  Lab 02/05/20 0633 02/06/20 0443 02/07/20 0635 02/08/20 0621 02/09/20 1039  NA 141 141 140 140 141  K 4.0 3.8 3.7 3.5 4.1  CL 115* 112* 113* 111 109  CO2 18* 18* 20* 20* 19*  GLUCOSE 133* 166* 232* 226* 177*  BUN 31* 30* 37* 40* 44*  CREATININE 2.75* 2.75* 2.95* 3.20* 3.15*    CALCIUM 7.9* 7.8* 7.5* 7.8* 7.8*  MG 2.1  --   --   --   --   PHOS 2.5 3.2 3.9 4.3 4.3  4.3    Studies: No results found.  Scheduled Meds: . allopurinol  100 mg Oral Daily  . apixaban  5 mg Oral Q12H  . atorvastatin  20 mg Oral QHS  . calcitRIOL  0.25 mcg Oral Daily  . cefdinir  300 mg Oral Q24H  . cholecalciferol  1,000 Units Oral Daily  . feeding supplement (GLUCERNA SHAKE)  237 mL Oral TID BM  . feeding supplement (PRO-STAT SUGAR FREE 64)  30 mL Oral BID  . hydrocortisone   Rectal TID  . insulin aspart  0-5 Units Subcutaneous QHS  . insulin aspart  0-9 Units Subcutaneous TID WC  . multivitamin with minerals  1 tablet Oral Daily  . nortriptyline  25 mg Oral QHS  . pantoprazole  40 mg Oral Daily  . phenylephrine  1 suppository Rectal BID  . phosphorus  1 tablet Oral 3 times per day  . polyethylene glycol  17 g Oral Daily  . [START ON 02/10/2020] predniSONE  20 mg Oral Q breakfast   Followed by  . [START ON 02/12/2020] predniSONE  10 mg Oral Q breakfast  . senna-docusate  1 tablet Oral BID  . simethicone  80 mg Oral QID  . tacrolimus  1.5 mg Oral BID   Continuous Infusions:  PRN Meds: acetaminophen, bisacodyl, famotidine, HYDROmorphone, ondansetron **OR** ondansetron (ZOFRAN) IV, witch hazel-glycerin  Time spent: 35 minutes  Author: Berle Mull, MD [facility] 02/09/2020 1:09 PM  To reach On-call, see care teams to locate the attending and reach out to them via [email or web address]. If 7PM-7AM, please contact night-coverage If you still have difficulty reaching the attending provider, please page the [facility] (Director on Call) for [facility] on amion for assistance.

## 2020-02-09 NOTE — Progress Notes (Signed)
Patient ID: Wanda [name], female   DOB: 02-26-1961, 59 y.o.   MRN: [id number] S: Feels better but still not making much urine O:BP (!) 157/87 (BP Location: Left Arm)   Pulse 88   Temp 97.7 F (36.5 C) (Oral)   Resp 18   Ht 5\' 9"  (1.753 m)   Wt 79.6 kg   SpO2 100%   BMI 25.91 kg/m   Intake/Output Summary (Last 24 hours) at 02/09/2020 1104 Last data filed at 02/09/2020 1032 Gross per 24 hour  Intake 600 ml  Output 251 ml  Net 349 ml   Intake/Output: I/O last 3 completed shifts: In: 1320 [P.O.:1320] Out: 101 [Urine:100; Stool:1]  Intake/Output this shift:  Total I/O In: -  Out: 201 [Urine:200; Stool:1] Weight change: -1.5 kg Gen: NAD CVS: no rub Resp: cta Abd: +BS, soft, NT/ND Ext: 1+ edema, right foot bandaged  Recent Labs  Lab 02/04/20 1315 02/05/20 0633 02/06/20 0443 02/07/20 0635 02/08/20 0621  NA 139 141 141 140 140  K 4.0 4.0 3.8 3.7 3.5  CL 112* 115* 112* 113* 111  CO2 19* 18* 18* 20* 20*  GLUCOSE 219* 133* 166* 232* 226*  BUN 28* 31* 30* 37* 40*  CREATININE 2.74* 2.75* 2.75* 2.95* 3.20*  ALBUMIN 1.6* 1.2* 1.6* 1.5* 1.5*  CALCIUM 8.6* 7.9* 7.8* 7.5* 7.8*  PHOS  --  2.5 3.2 3.9 4.3  AST 24 58*  --   --   --   ALT 19 26  --   --   --    Liver Function Tests: Recent Labs  Lab 02/04/20 1315 02/04/20 1315 02/05/20 0633 02/05/20 0633 02/06/20 0443 02/07/20 0635 02/08/20 0621  AST 24  --  58*  --   --   --   --   ALT 19  --  26  --   --   --   --   ALKPHOS 484*  --  660*  --   --   --   --   BILITOT 0.9  --  0.5  --   --   --   --   PROT 5.2*  --  4.1*  --   --   --   --   ALBUMIN 1.6*   < > 1.2*   < > 1.6* 1.5* 1.5*   < > = values in this interval not displayed.   No results for input(s): LIPASE, AMYLASE in the last 168 hours. No results for input(s): AMMONIA in the last 168 hours. CBC: Recent Labs  Lab 02/04/20 1255 02/04/20 1255 02/05/20 9702 02/05/20 6378 02/06/20 0443 02/07/20 0635 02/09/20 1039  WBC 4.4   < > 4.3   < > 3.4* 3.7*  4.9  NEUTROABS  --   --   --   --   --  3.5  --   HGB 11.1*   < > 11.0*   < > 9.5* 10.1* 11.8*  HCT 36.0   < > 35.1*   < > 30.8* 32.2* 37.1  MCV 108.1*  --  106.4*  --  106.6* 104.2* 101.6*  PLT 237   < > 239   < > 235 292 330   < > = values in this interval not displayed.   Cardiac Enzymes: No results for input(s): CKTOTAL, CKMB, CKMBINDEX, TROPONINI in the last 168 hours. CBG: Recent Labs  Lab 02/08/20 0639 02/08/20 1117 02/08/20 1603 02/08/20 2106 02/09/20 0611  GLUCAP 218* 190* 215* 196* 213*    Iron Studies:  No results for input(s): IRON, TIBC, TRANSFERRIN, FERRITIN in the last 72 hours. Studies/Results: ECHOCARDIOGRAM LIMITED  Result Date: 02/08/2020    ECHOCARDIOGRAM LIMITED REPORT   Patient Name:   Wanda [name] Date of Exam: 02/08/2020 Medical Rec #:  [id number]         Height:       69.0 in Accession #:    [id number]        Weight:       178.8 lb Date of Birth:  1961-07-26        BSA:          1.970 m Patient Age:    24 years          BP:           150/87 mmHg Patient Gender: F                 HR:           84 bpm. Exam Location:  Inpatient Procedure: Limited Echo Indications:    Pericardial effusion  History:        Patient has prior history of Echocardiogram examinations, most                 recent 02/05/2020. Transplant Complications, Pericardial Disease,                 Arrythmias:Atrial Fibrillation; Risk Factors:Diabetes,                 Hypertension and Dyslipidemia. Pericardial effusion, pleural                 effusion, Kidney transplant.  Sonographer:    Dustin Flock Referring Phys: [id number] [place]  1. Left ventricular ejection fraction, by estimation, is 70 to 75%. The left ventricle has hyperdynamic function. There is severe concentric left ventricular hypertrophy.  2. Right ventricular systolic function is normal. The right ventricular size is normal.  3. The pericardial effusion is moderate - large in sice . It is primarily located posteriorly.  There is no evidence of pericardial tamponade. . Large pericardial effusion. There is no evidence of cardiac tamponade. FINDINGS  Left Ventricle: Left ventricular ejection fraction, by estimation, is 70 to 75%. The left ventricle has hyperdynamic function. There is severe concentric left ventricular hypertrophy. Right Ventricle: The right ventricular size is normal. No increase in right ventricular wall thickness. Right ventricular systolic function is normal. Pericardium: The pericardial effusion is moderate - large in sice . It is primarily located posteriorly. There is no evidence of pericardial tamponade. A large pericardial effusion is present. There is no evidence of cardiac tamponade. Mertie Moores MD Electronically signed by Mertie Moores MD Signature Date/Time: 02/08/2020/1:53:18 PM    Final    . allopurinol  100 mg Oral Daily  . apixaban  5 mg Oral Q12H  . atorvastatin  20 mg Oral QHS  . calcitRIOL  0.25 mcg Oral Daily  . cefdinir  300 mg Oral Q24H  . cholecalciferol  1,000 Units Oral Daily  . feeding supplement (GLUCERNA SHAKE)  237 mL Oral TID BM  . feeding supplement (PRO-STAT SUGAR FREE 64)  30 mL Oral BID  . hydrocortisone   Rectal TID  . insulin aspart  0-5 Units Subcutaneous QHS  . insulin aspart  0-9 Units Subcutaneous TID WC  . multivitamin with minerals  1 tablet Oral Daily  . nortriptyline  25 mg Oral QHS  . pantoprazole  40 mg Oral Daily  .  phenylephrine  1 suppository Rectal BID  . phosphorus  1 tablet Oral 3 times per day  . polyethylene glycol  17 g Oral Daily  . [START ON 02/10/2020] predniSONE  20 mg Oral Q breakfast   Followed by  . [START ON 02/12/2020] predniSONE  10 mg Oral Q breakfast  . senna-docusate  1 tablet Oral BID  . simethicone  80 mg Oral QID  . tacrolimus  1.5 mg Oral BID    BMET    Component Value Date/Time   NA 140 02/08/2020 0621   K 3.5 02/08/2020 0621   CL 111 02/08/2020 0621   CO2 20 (L) 02/08/2020 0621   GLUCOSE 226 (H) 02/08/2020 0621    BUN 40 (H) 02/08/2020 0621   CREATININE 3.20 (H) 02/08/2020 0621   CREATININE 8.80 (H) 07/10/2014 1332   CALCIUM 7.8 (L) 02/08/2020 0621   GFRNONAA 15 (L) 02/08/2020 0621   GFRAA 18 (L) 02/08/2020 0621   CBC    Component Value Date/Time   WBC 4.9 02/09/2020 1039   RBC 3.65 (L) 02/09/2020 1039   HGB 11.8 (L) 02/09/2020 1039   HGB 10.5 (L) 08/03/2010 1119   HCT 37.1 02/09/2020 1039   HCT 30.7 (L) 08/03/2010 1119   PLT 330 02/09/2020 1039   PLT 277 08/03/2010 1119   MCV 101.6 (H) 02/09/2020 1039   MCV 84.6 08/03/2010 1119   MCH 32.3 02/09/2020 1039   MCHC 31.8 02/09/2020 1039   RDW 15.9 (H) 02/09/2020 1039   RDW 13.4 08/03/2010 1119   LYMPHSABS 0.2 (L) 02/07/2020 0635   LYMPHSABS 2.5 08/03/2010 1119   MONOABS 0.0 (L) 02/07/2020 0635   MONOABS 0.4 08/03/2010 1119   EOSABS 0.0 02/07/2020 0635   EOSABS 0.2 08/03/2010 1119   BASOSABS 0.0 02/07/2020 0635   BASOSABS 0.1 08/03/2010 1119   Assessment/Plan: 1. AKI/CKD stage IIIa- likely due to ischemic ATN in setting of volume depletion, hypotension, and recurrent Urosepsis. Baseline Scr appears to be 1.6-1.8. Continue with supportive care and follow UOP and Scr.  1. UOP has dropped despite increased BP 2. IVF's stopped due to 3rd spacing 3. No indication for HD at this time 4. Hope to see some improvement of renal function with improved BP soon. 5. May benefit from IV albumin given malnutrition. 2. S/p DDKT with chronic immunosuppressive agents- continue with prograf 1.5 mg bid and agree with stress dose steroids given hypotension and acute infection. 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 She also drives to [facility] every month for IV Belatacept.She would like to transfer to a closer transplant center ([facility] does administer IV Belatacept) 3. Hypotension/Shock- hypovolemic +/- urosepsis- BP improved with IVF"s and stress dose steroids 1. Agree with decreasing midodrine dose due to elevated bp. 4. Recurrent  UTI's and infections- currently on IV Ceftriaxone. Urine culture + for Klebsiella >100,000 cfu,sensitive to ceftriaxone, Enterococcus 40,000 cfu sensitive only to vanc and imipenem.Abx per primary 5. Severe protein malnutrition- albumin 1.2-1.6. Protein supplements and encourage po intake. 6. Pleural effusions and pericardial effusion on CT scan- unclear etiologybut likely due to 3rd spacing from hypoalbuminemia. IVF"s held but not consistent with CHF given her hypotension. ECHOwith EF of 70-75%, Grade I DD, elevated left atrial pressure, normal RV, moderate pericardial effusion. 7. P. Atrial fibrillation- continue metoprolol and apixaban. 8. Type II DM- per primary 9. Hypothyroidism- TSH markedly elevated. Start replacement therapy. 10. Severe deconditioning and debility- will likely require SNF and PT/OT 11. Right heel decubitus ulcer- since her  last hospitalization she has had progressive debility and is now wheelchair bound. Wound care following. No evidence of osteo.  12. Disposition- will likely need SNF placement vs. CIR  Donetta Potts, MD [facility] [phone]

## 2020-02-10 LAB — CBC
HCT: 36.5 % (ref 36.0–46.0)
Hemoglobin: 11.5 g/dL — ABNORMAL LOW (ref 12.0–15.0)
MCH: 32.7 pg (ref 26.0–34.0)
MCHC: 31.5 g/dL (ref 30.0–36.0)
MCV: 103.7 fL — ABNORMAL HIGH (ref 80.0–100.0)
Platelets: 268 10*3/uL (ref 150–400)
RBC: 3.52 MIL/uL — ABNORMAL LOW (ref 3.87–5.11)
RDW: 16.3 % — ABNORMAL HIGH (ref 11.5–15.5)
WBC: 5.2 10*3/uL (ref 4.0–10.5)
nRBC: 0 % (ref 0.0–0.2)

## 2020-02-10 LAB — RENAL FUNCTION PANEL
Albumin: 1.8 g/dL — ABNORMAL LOW (ref 3.5–5.0)
Anion gap: 11 (ref 5–15)
BUN: 44 mg/dL — ABNORMAL HIGH (ref 6–20)
CO2: 18 mmol/L — ABNORMAL LOW (ref 22–32)
Calcium: 7.6 mg/dL — ABNORMAL LOW (ref 8.9–10.3)
Chloride: 112 mmol/L — ABNORMAL HIGH (ref 98–111)
Creatinine, Ser: 3.1 mg/dL — ABNORMAL HIGH (ref 0.44–1.00)
GFR calc Af Amer: 18 mL/min — ABNORMAL LOW (ref >60)
GFR calc non Af Amer: 16 mL/min — ABNORMAL LOW (ref >60)
Glucose, Bld: 172 mg/dL — ABNORMAL HIGH (ref 70–99)
Phosphorus: 4.5 mg/dL (ref 2.5–4.6)
Potassium: 4.6 mmol/L (ref 3.5–5.1)
Sodium: 141 mmol/L (ref 135–145)

## 2020-02-10 LAB — GLUCOSE, CAPILLARY
Glucose-Capillary: 135 mg/dL — ABNORMAL HIGH (ref 70–99)
Glucose-Capillary: 144 mg/dL — ABNORMAL HIGH (ref 70–99)
Glucose-Capillary: 157 mg/dL — ABNORMAL HIGH (ref 70–99)
Glucose-Capillary: 180 mg/dL — ABNORMAL HIGH (ref 70–99)
Glucose-Capillary: 205 mg/dL — ABNORMAL HIGH (ref 70–99)

## 2020-02-10 LAB — CULTURE, BLOOD (ROUTINE X 2): Culture: NO GROWTH

## 2020-02-10 MED ORDER — PREGABALIN 25 MG PO CAPS
25.0000 mg | ORAL_CAPSULE | Freq: Every day | ORAL | Status: DC
  Administered 2020-02-10 – 2020-02-11 (×2): 25 mg via ORAL
  Filled 2020-02-10 (×2): qty 1

## 2020-02-10 NOTE — TOC Transition Note (Addendum)
Transition of Care [facility]) - CM/SW Discharge Note   Patient Details  Name: Wanda [name] MRN: [id number] Date of Birth: [birth year]  [facility] of [facility]) CM/SW Contact:  Zenon Mayo, RN Phone Number: 02/10/2020, 4:13 PM   Clinical Narrative:     NCM added HHRN to [facility] services, Tiffany with [facility] states this will be fine.  She will have HHRN, HHPT, HHOT at dc. Orders are in. Hoyer lift to be delivered to patient 's home today per Thedore Mins with [employer].  Patient will need PTAR transport home.  5/11 8381 - PTAR scheduled for 11 am, Staff RN Ava aware.    Final next level of care: [place] Barriers to Discharge: Continued Medical Work up   Patient Goals and CMS Choice Patient states their goals for this hospitalization and ongoing recovery are:: get better CMS Medicare.gov Compare Post Acute Care list provided to:: Patient Choice offered to / list presented to : Patient  Discharge Placement                       Discharge Plan and Services   Discharge Planning Services: CM Consult Post Acute Care Choice: Home Health, Durable Medical Equipment          DME Arranged: Other see comment(Hoyer lift) DME Agency: [employer] Date DME Agency Contacted: 02/06/20 Time DME Agency Contacted: [id number] Representative spoke with at DME Agency: Thedore Mins HH Arranged: RN, PT, OT [facility] Agency: [facility] Date [place]: 02/07/20 Time [place]: [place] Representative spoke with at [place]: [place] ([place]) Interventions     Readmission Risk Interventions Readmission Risk Prevention Plan 02/06/2020  Transportation Screening Complete  PCP or Specialist Appt within 3-5 Days Complete  HRI or [place] Complete  Social Work Consult for [place] Planning/Counseling [place] Not Applicable  Medication Review Press photographer) Complete  Some recent  data might be hidden

## 2020-02-10 NOTE — Care Management Important Message (Signed)
Important Message  Patient Details  Name: YOMAIRA SOLAR MRN: [id number] Date of Birth: 1961/08/03   Medicare Important Message Given:  Yes     Shelda Altes 02/10/2020, 9:19 AM

## 2020-02-10 NOTE — Progress Notes (Signed)
Physical Therapy Treatment Patient Details Name: Wanda [name] MRN: [id number] DOB: [birth year] Today's Date: 02/10/2020    History of Present Illness Pt is a 59 yo female presenting due to hypotension, weakness, and fatigue. Upon admission, pt found to have UTI causing hypotension and AKI. PMH includes: DM II, HTN, a-fib, HLD, ESRD with transplant 2020, microcytic anemia, and hx of gastric sleeve.    PT Comments    Pt received in bed, agreeable to participation in therapy. She required +2 min assist bed mobility and +2 mod assist slide board transfer bed to recliner. Total assist with slide board placement. Pt in recliner with feet elevated at end of session. NT instructed in use of maximove for return to bed. Sling in place under pt in recliner.    Follow Up Recommendations  Supervision/Assistance - 24 hour;Home health PT     Equipment Recommendations  Other (comment)(hoyer lift)    Recommendations for Other Services       Precautions / Restrictions Precautions Precautions: Fall;Other (comment) Precaution Comments: Pt with WC use at baseline, wounds on bilateral heels, orthostatic Restrictions Weight Bearing Restrictions: No    Mobility  Bed Mobility Overal bed mobility: Needs Assistance Bed Mobility: Sit to Supine       Sit to supine: +2 for physical assistance;Min assist   General bed mobility comments: assist with BLE off bed and to elevate trunk, increased time to complete due to dizziness. Pt sat EOB x 4-5 minutes prior to transfer OOB.  Transfers Overall transfer level: Needs assistance Equipment used: Sliding board Transfers: Lateral/Scoot Transfers          Lateral/Scoot Transfers: Mod assist;+2 physical assistance General transfer comment: Dependent with slide board placement. Use of bed pad to assist with scoot transfer toward L. BP 74/61  Ambulation/Gait             General Gait Details: unable   Stairs             Wheelchair  Mobility    Modified Rankin (Stroke Patients Only)       Balance Overall balance assessment: Needs assistance Sitting-balance support: Bilateral upper extremity supported;Feet supported Sitting balance-Leahy Scale: Fair Sitting balance - Comments: static sit without external support EOB                                    Cognition Arousal/Alertness: Awake/alert Behavior During Therapy: WFL for tasks assessed/performed Overall Cognitive Status: Within Functional Limits for tasks assessed                                        Exercises      General Comments General comments (skin integrity, edema, etc.): Dizziness upon sitting, clearing after 4-5 minutes. BP 74/61 in recliner.      Pertinent Vitals/Pain Pain Assessment: No/denies pain    Home Living                      Prior Function            PT Goals (current goals can now be found in the care plan section) Acute Rehab PT Goals Patient Stated Goal: return home Progress towards PT goals: Progressing toward goals    Frequency    Min 3X/week      PT Plan Current plan remains appropriate  Co-evaluation PT/OT/SLP Co-Evaluation/Treatment: Yes Reason for Co-Treatment: Complexity of the patient's impairments (multi-system involvement);For patient/therapist safety;To address functional/ADL transfers PT goals addressed during session: Mobility/safety with mobility;Balance;Proper use of DME        AM-PAC PT "6 Clicks" Mobility   Outcome Measure  Help needed turning from your back to your side while in a flat bed without using bedrails?: A Little Help needed moving from lying on your back to sitting on the side of a flat bed without using bedrails?: A Little Help needed moving to and from a bed to a chair (including a wheelchair)?: A Lot Help needed standing up from a chair using your arms (e.g., wheelchair or bedside chair)?: Total Help needed to walk in hospital  room?: Total Help needed climbing 3-5 steps with a railing? : Total 6 Click Score: 11    End of Session   Activity Tolerance: Patient tolerated treatment well Patient left: in chair;with call bell/phone within reach;with chair alarm set Nurse Communication: Mobility status;Need for lift equipment PT Visit Diagnosis: Difficulty in walking, not elsewhere classified (R26.2);Muscle weakness (generalized) (M62.81)     Time: 9767-3419 PT Time Calculation (min) (ACUTE ONLY): 25 min  Charges:  $Therapeutic Activity: 8-22 mins                     Lorrin Goodell, PT  Office # [phone] Pager [phone]    Lorriane Shire 02/10/2020, 10:36 AM

## 2020-02-10 NOTE — Plan of Care (Signed)
?  Problem: Coping: ?Goal: Level of anxiety will decrease ?Outcome: Progressing ?  ?Problem: Safety: ?Goal: Ability to remain free from injury will improve ?Outcome: Progressing ?  ?

## 2020-02-10 NOTE — Progress Notes (Signed)
[facility] Progress Note  Patient: Wanda [name]    RKY:[id number]  DOA: 02/04/2020     Date of Service: the patient was seen and examined on 02/10/2020  Chief Complaint  Patient presents with  . Hypotension   Brief hospital course: Wanda [name] is a 59 y.o. female with medical history significant of T2DM, HTN, Paroxysmal A. Fib, HLD, ESRD with failed kidney transplant 2013 and repeat transplant September 2020 at [facility], microcytic anemia, Hx of gastric sleeve who presents with dysuria and hypotension.  Currently further plan is continue antibiotics and monitor renal function which is finally stabilizing.  Assessment and Plan: 1.  Sepsis POA. Secondary to UTI due to Klebsiella oxytoca as well as Enterococcus  Initially on IV antibiotics.   change to oral antibiotics. 1 dose of fosfomycin. Bactrim currently on hold. Blood pressure improving.  Monitor.  2.  Hypotension resolved Blood pressure improving Lactic acid not elevated. IV albumin as well as 1 L of IV bolus was given Added midodrine.  Monitor on telemetry. Patient was given stress dose steroids.  3.  Anasarca. Hypoalbuminemia. Moderate pericardial effusion without any tamponade. Bilateral pleural effusion. Suspect this is all in the setting of third spacing from hypoalbuminemia. Currently echocardiogram does not show any evidence of tamponade. Monitor. Repeat echocardiogram shows persistent moderate pericardial effusion.  No evidence of tamponade. We will discuss with cardiology prior to discharge for follow-up.  4.  ESRD SP renal transplant. Chronic immunosuppressive medication Acute kidney injury on chronic kidney disease stage III Anemia chronic kidney disease. Renal function finally plateauing. Urine output 1250 cc yesterday. Nephrology consulted Appreciate assistance. Currently CellCept is on hold given concern for bacteremia and viremia.  5.  Paroxysmal A. fib Eliquis currently  receiving. Metoprolol on hold.  6.  Type 2 diabetes mellitus with nephropathy and neuropathy. Continue gabapentin. Continue sliding scale insulin. Monitor.  7.  Moderate protein calorie malnutrition. Failure to thrive Body mass index is 25.91 kg/m.  Nutrition Problem: Moderate Malnutrition Etiology: chronic illness(ESRD s/p transplant) Interventions: Interventions: Glucerna shake, MVI, Prostat   8.  Bilateral lower extremity edema. Bilateral lower extremity tenderness. Lower extremity ABI unable to be performed, bilateral pedal vessels are noncompressible with abnormal toe waveforms.  Will require vascular surgery follow-up outpatient. Lower extremity Dopplers ordered currently no evidence of DVT.  9.  Right lower lobe necrotic ulcer. Wound care consulted. Continue antibiotics. MRI foot as well as x-ray foot negative for any osteomyelitis and shows soft tissue edema. Monitor.  10.  Hemorrhoids. Constipation. Initiating bowel regimen. Continue to prep H as well as Anusol cream.  11.  sick euthyroid syndrome. Repeat TSH and free T4 normal.  No indication for replacement.  12.  Chronic neuropathy. Will initiate Lyrica. Reported patient was on 75 mg daily.  We will start with 25 mg daily dose.  Diet: Carb modified diet DVT Prophylaxis: Therapeutic Anticoagulation with Eliquis   Advance goals of care discussion: Full code  Family Communication: no family was present at bedside, at the time of interview.   Disposition:  Status is: Inpatient  Remains inpatient appropriate because:IV treatments appropriate due to intensity of illness or inability to take PO and Inpatient level of care appropriate due to severity of illness   Dispo: The patient is from: Home              Anticipated d/c is to: SNF              Anticipated d/c date is: > 3 days  Patient currently is not medically stable to d/c.  Subjective: Constipation resolved.  No nausea no vomiting.  No  further pain in the rectum area.  No fever no chills.  Physical Exam: General:  alert oriented to time, place, and person.  Appear in mild distress, affect appropriate Eyes: PERRL ENT: Oral Mucosa Clear, moist  Neck: difficult to assess  JVD,  Cardiovascular: S1 and S2 Present, no Murmur,  Respiratory: good respiratory effort, Bilateral Air entry equal and Decreased, no Crackles, no wheezes Abdomen: Bowel Sound present, Soft and no tenderness,  Skin: no rash Extremities: bilateral upper extremity edema,no pedal edema, no calf tenderness Neurologic: without any new focal findings Gait not checked due to patient safety concerns  Vitals:   02/09/20 2102 02/10/20 0022 02/10/20 0354 02/10/20 1911  BP: 110/73 112/76 105/67 118/80  Pulse: (!) 102 77 95 96  Resp: 18 16 18 18   Temp: 98.4 F (36.9 C) 98.5 F (36.9 C) 98 F (36.7 C) (!) 97.5 F (36.4 C)  TempSrc: Oral Oral Oral Oral  SpO2: 100% 95% 100% 100%  Weight:   79.6 kg   Height:        Intake/Output Summary (Last 24 hours) at 02/10/2020 2015 Last data filed at 02/10/2020 1806 Gross per 24 hour  Intake 720 ml  Output 1050 ml  Net -330 ml   Filed Weights   02/08/20 0302 02/09/20 0444 02/10/20 0354  Weight: 81.1 kg 79.6 kg 79.6 kg    Data Reviewed: I have personally reviewed and interpreted daily labs, tele strips, imagings as discussed above. I reviewed all nursing notes, pharmacy notes, vitals, pertinent old records I have discussed plan of care as described above with RN and patient/family.  CBC: Recent Labs  Lab 02/05/20 0633 02/06/20 0443 02/07/20 0635 02/09/20 1039 02/10/20 1202  WBC 4.3 3.4* 3.7* 4.9 5.2  NEUTROABS  --   --  3.5  --   --   HGB 11.0* 9.5* 10.1* 11.8* 11.5*  HCT 35.1* 30.8* 32.2* 37.1 36.5  MCV 106.4* 106.6* 104.2* 101.6* 103.7*  PLT 239 235 292 330 161   Basic Metabolic Panel: Recent Labs  Lab 02/05/20 0633 02/05/20 0633 02/06/20 0443 02/07/20 0635 02/08/20 0621 02/09/20 1039  02/10/20 1202  NA 141   < > 141 140 140 141 141  K 4.0   < > 3.8 3.7 3.5 4.1 4.6  CL 115*   < > 112* 113* 111 109 112*  CO2 18*   < > 18* 20* 20* 19* 18*  GLUCOSE 133*   < > 166* 232* 226* 177* 172*  BUN 31*   < > 30* 37* 40* 44* 44*  CREATININE 2.75*   < > 2.75* 2.95* 3.20* 3.15* 3.10*  CALCIUM 7.9*   < > 7.8* 7.5* 7.8* 7.8* 7.6*  MG 2.1  --   --   --   --   --   --   PHOS 2.5   < > 3.2 3.9 4.3 4.3  4.3 4.5   < > = values in this interval not displayed.    Studies: No results found.  Scheduled Meds: . allopurinol  100 mg Oral Daily  . apixaban  5 mg Oral Q12H  . atorvastatin  20 mg Oral QHS  . calcitRIOL  0.25 mcg Oral Daily  . cefdinir  300 mg Oral Q24H  . cholecalciferol  1,000 Units Oral Daily  . feeding supplement (GLUCERNA SHAKE)  237 mL Oral TID BM  . feeding supplement (PRO-STAT  SUGAR FREE 64)  30 mL Oral BID  . hydrocortisone   Rectal TID  . insulin aspart  0-5 Units Subcutaneous QHS  . insulin aspart  0-9 Units Subcutaneous TID WC  . multivitamin with minerals  1 tablet Oral Daily  . nortriptyline  25 mg Oral QHS  . pantoprazole  40 mg Oral Daily  . phenylephrine  1 suppository Rectal BID  . phosphorus  1 tablet Oral 3 times per day  . polyethylene glycol  17 g Oral Daily  . predniSONE  20 mg Oral Q breakfast   Followed by  . [START ON 02/12/2020] predniSONE  10 mg Oral Q breakfast  . pregabalin  25 mg Oral Daily  . senna-docusate  1 tablet Oral BID  . simethicone  80 mg Oral QID  . tacrolimus  1.5 mg Oral BID   Continuous Infusions:  PRN Meds: acetaminophen, bisacodyl, famotidine, HYDROmorphone, ondansetron **OR** ondansetron (ZOFRAN) IV, witch hazel-glycerin  Time spent: 35 minutes  Author: Berle Mull, MD [facility] 02/10/2020 8:15 PM  To reach On-call, see care teams to locate the attending and reach out to them via [email or web address]. If 7PM-7AM, please contact night-coverage If you still have difficulty reaching the attending provider, please  page the [facility] (Director on Call) for [facility] on amion for assistance.

## 2020-02-10 NOTE — Progress Notes (Signed)
Patient ID: Wanda [name], female   DOB: Jul 20, 1961, 59 y.o.   MRN: [id number] S: UOP finally increased -  crt stable from yesterday    O:BP 105/67 (BP Location: Left Arm)   Pulse 95   Temp 98 F (36.7 C) (Oral)   Resp 18   Ht 5\' 9"  (1.753 m)   Wt 79.6 kg   SpO2 100%   BMI 25.91 kg/m   Intake/Output Summary (Last 24 hours) at 02/10/2020 0829 Last data filed at 02/10/2020 0357 Gross per 24 hour  Intake 240 ml  Output 1251 ml  Net -1011 ml   Intake/Output: I/O last 3 completed shifts: In: 720 [P.O.:720] Out: 1301 [Urine:1300; Stool:1]  Intake/Output this shift:  No intake/output data recorded. Weight change: 0 kg Gen: NAD CVS: no rub Resp: cta Abd: +BS, soft, NT/ND Ext: 1+ edema, right foot bandaged  Recent Labs  Lab 02/04/20 1315 02/05/20 0633 02/06/20 0443 02/07/20 0635 02/08/20 0621 02/09/20 1039  NA 139 141 141 140 140 141  K 4.0 4.0 3.8 3.7 3.5 4.1  CL 112* 115* 112* 113* 111 109  CO2 19* 18* 18* 20* 20* 19*  GLUCOSE 219* 133* 166* 232* 226* 177*  BUN 28* 31* 30* 37* 40* 44*  CREATININE 2.74* 2.75* 2.75* 2.95* 3.20* 3.15*  ALBUMIN 1.6* 1.2* 1.6* 1.5* 1.5* 1.8*  CALCIUM 8.6* 7.9* 7.8* 7.5* 7.8* 7.8*  PHOS  --  2.5 3.2 3.9 4.3 4.3  4.3  AST 24 58*  --   --   --   --   ALT 19 26  --   --   --   --    Liver Function Tests: Recent Labs  Lab 02/04/20 1315 02/04/20 1315 02/05/20 0633 02/06/20 0443 02/07/20 0635 02/08/20 0621 02/09/20 1039  AST 24  --  58*  --   --   --   --   ALT 19  --  26  --   --   --   --   ALKPHOS 484*  --  660*  --   --   --   --   BILITOT 0.9  --  0.5  --   --   --   --   PROT 5.2*  --  4.1*  --   --   --   --   ALBUMIN 1.6*   < > 1.2*   < > 1.5* 1.5* 1.8*   < > = values in this interval not displayed.   No results for input(s): LIPASE, AMYLASE in the last 168 hours. No results for input(s): AMMONIA in the last 168 hours. CBC: Recent Labs  Lab 02/04/20 1255 02/04/20 1255 02/05/20 3491 02/05/20 7915 02/06/20 0443  02/07/20 0635 02/09/20 1039  WBC 4.4   < > 4.3   < > 3.4* 3.7* 4.9  NEUTROABS  --   --   --   --   --  3.5  --   HGB 11.1*   < > 11.0*   < > 9.5* 10.1* 11.8*  HCT 36.0   < > 35.1*   < > 30.8* 32.2* 37.1  MCV 108.1*  --  106.4*  --  106.6* 104.2* 101.6*  PLT 237   < > 239   < > 235 292 330   < > = values in this interval not displayed.   Cardiac Enzymes: No results for input(s): CKTOTAL, CKMB, CKMBINDEX, TROPONINI in the last 168 hours. CBG: Recent Labs  Lab 02/09/20 1209 02/09/20 1657  02/09/20 2108 02/10/20 0047 02/10/20 0610  GLUCAP 160* 163* 219* 180* 157*    Iron Studies: No results for input(s): IRON, TIBC, TRANSFERRIN, FERRITIN in the last 72 hours. Studies/Results: ECHOCARDIOGRAM LIMITED  Result Date: 02/08/2020    ECHOCARDIOGRAM LIMITED REPORT   Patient Name:   Wanda [name] Date of Exam: 02/08/2020 Medical Rec #:  [id number]         Height:       69.0 in Accession #:    [id number]        Weight:       178.8 lb Date of Birth:  [birth year]        BSA:          1.970 m Patient Age:    36 years          BP:           150/87 mmHg Patient Gender: F                 HR:           84 bpm. Exam Location:  Inpatient Procedure: Limited Echo Indications:    Pericardial effusion  History:        Patient has prior history of Echocardiogram examinations, most                 recent 02/05/2020. Transplant Complications, Pericardial Disease,                 Arrythmias:Atrial Fibrillation; Risk Factors:Diabetes,                 Hypertension and Dyslipidemia. Pericardial effusion, pleural                 effusion, Kidney transplant.  Sonographer:    Dustin Flock Referring Phys: [id number] [place]  1. Left ventricular ejection fraction, by estimation, is 70 to 75%. The left ventricle has hyperdynamic function. There is severe concentric left ventricular hypertrophy.  2. Right ventricular systolic function is normal. The right ventricular size is normal.  3. The pericardial effusion  is moderate - large in sice . It is primarily located posteriorly. There is no evidence of pericardial tamponade. . Large pericardial effusion. There is no evidence of cardiac tamponade. FINDINGS  Left Ventricle: Left ventricular ejection fraction, by estimation, is 70 to 75%. The left ventricle has hyperdynamic function. There is severe concentric left ventricular hypertrophy. Right Ventricle: The right ventricular size is normal. No increase in right ventricular wall thickness. Right ventricular systolic function is normal. Pericardium: The pericardial effusion is moderate - large in sice . It is primarily located posteriorly. There is no evidence of pericardial tamponade. A large pericardial effusion is present. There is no evidence of cardiac tamponade. Mertie Moores MD Electronically signed by Mertie Moores MD Signature Date/Time: 02/08/2020/1:53:18 PM    Final    . allopurinol  100 mg Oral Daily  . apixaban  5 mg Oral Q12H  . atorvastatin  20 mg Oral QHS  . calcitRIOL  0.25 mcg Oral Daily  . cefdinir  300 mg Oral Q24H  . cholecalciferol  1,000 Units Oral Daily  . feeding supplement (GLUCERNA SHAKE)  237 mL Oral TID BM  . feeding supplement (PRO-STAT SUGAR FREE 64)  30 mL Oral BID  . hydrocortisone   Rectal TID  . insulin aspart  0-5 Units Subcutaneous QHS  . insulin aspart  0-9 Units Subcutaneous TID WC  . multivitamin with minerals  1 tablet Oral Daily  .  nortriptyline  25 mg Oral QHS  . pantoprazole  40 mg Oral Daily  . phenylephrine  1 suppository Rectal BID  . phosphorus  1 tablet Oral 3 times per day  . polyethylene glycol  17 g Oral Daily  . predniSONE  20 mg Oral Q breakfast   Followed by  . [START ON 02/12/2020] predniSONE  10 mg Oral Q breakfast  . senna-docusate  1 tablet Oral BID  . simethicone  80 mg Oral QID  . tacrolimus  1.5 mg Oral BID    BMET    Component Value Date/Time   NA 141 02/09/2020 1039   K 4.1 02/09/2020 1039   CL 109 02/09/2020 1039   CO2 19 (L)  02/09/2020 1039   GLUCOSE 177 (H) 02/09/2020 1039   BUN 44 (H) 02/09/2020 1039   CREATININE 3.15 (H) 02/09/2020 1039   CREATININE 8.80 (H) 07/10/2014 1332   CALCIUM 7.8 (L) 02/09/2020 1039   GFRNONAA 15 (L) 02/09/2020 1039   GFRAA 18 (L) 02/09/2020 1039   CBC    Component Value Date/Time   WBC 4.9 02/09/2020 1039   RBC 3.65 (L) 02/09/2020 1039   HGB 11.8 (L) 02/09/2020 1039   HGB 10.5 (L) 08/03/2010 1119   HCT 37.1 02/09/2020 1039   HCT 30.7 (L) 08/03/2010 1119   PLT 330 02/09/2020 1039   PLT 277 08/03/2010 1119   MCV 101.6 (H) 02/09/2020 1039   MCV 84.6 08/03/2010 1119   MCH 32.3 02/09/2020 1039   MCHC 31.8 02/09/2020 1039   RDW 15.9 (H) 02/09/2020 1039   RDW 13.4 08/03/2010 1119   LYMPHSABS 0.2 (L) 02/07/2020 0635   LYMPHSABS 2.5 08/03/2010 1119   MONOABS 0.0 (L) 02/07/2020 0635   MONOABS 0.4 08/03/2010 1119   EOSABS 0.0 02/07/2020 0635   EOSABS 0.2 08/03/2010 1119   BASOSABS 0.0 02/07/2020 0635   BASOSABS 0.1 08/03/2010 1119   Assessment/Plan: 1. AKI/CKD stage IIIa- likely due to ischemic ATN in setting of volume depletion, hypotension, and recurrent Urosepsis. Baseline Scr appears to be 1.6-1.8. Continue with supportive care and follow UOP and Scr.  1. UOP finally up 2. IVF's stopped due to 3rd spacing 3. No indication for HD at this time 4. Seems to have hit plateau-  Hoping for trending better from her on out  2. S/p DDKT with chronic immunosuppressive agents-  prograf 1.5 mg bid and  stress dose steroids given hypotension and acute infection. 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 She also drives to [facility] every month for IV Belatacept.She would like to transfer to a closer transplant center ([facility] does administer IV Belatacept) 3. Hypotension/Shock- hypovolemic +/- urosepsis- BP improved with IVF"s and stress dose steroids.  Will not give diuretics as feel it will correct as hopefully renal functin recovers  4. Recurrent UTI's and  infections- currently on omnicef.  5. Severe protein malnutrition- albumin 1.2-1.6. Protein supplements and encourage po intake. 6. Pleural effusions and pericardial effusion on CT scan- unclear etiologybut likely due to 3rd spacing from hypoalbuminemia.  ECHOwith EF of 70-75%, Grade I DD, elevated left atrial pressure, normal RV, moderate pericardial effusion. 7. P. Atrial fibrillation- continue  apixaban.    8. Right heel decubitus ulcer- since her last hospitalization she has had progressive debility and is now wheelchair bound. Wound care following. No evidence of osteo.  9. Disposition- will likely need SNF placement vs. CIR  [name]  [employer] [phone]

## 2020-02-10 NOTE — Progress Notes (Signed)
Occupational Therapy Treatment Patient Details Name: Wanda [name] MRN: [id number] DOB: June 30, 1961 Today's Date: 02/10/2020    History of present illness Pt is a 59 yo female presenting due to hypotension, weakness, and fatigue. Upon admission, pt found to have UTI causing hypotension and AKI. PMH includes: DM II, HTN, a-fib, HLD, ESRD with transplant 2020, microcytic anemia, and hx of gastric sleeve.   OT comments  Pt performing sliding board transfer with +2 modA, minA +2 for bed mobility. Fair sitting balance at EOB. Dizziness upon sitting, clearing after 4-5 minutes. BP initially 74/44, water given to pt and after sliding board transfer, pt's BP 74/61 in recliner. Pt at baseline for ADL; pt reports she will not go to SNF and family agreeable to hoyer lift transfer at home with [facility]. OT following acutely.    Follow Up Recommendations  Home health OT;Supervision/Assistance - 24 hour    Equipment Recommendations  Other (comment)(hoyer lift)    Recommendations for Other Services      Precautions / Restrictions Precautions Precautions: Fall;Other (comment) Precaution Comments: Pt with WC use at baseline, wounds on bilateral heels, orthostatic Restrictions Weight Bearing Restrictions: No       Mobility Bed Mobility Overal bed mobility: Needs Assistance Bed Mobility: Sit to Supine       Sit to supine: +2 for physical assistance;Min assist   General bed mobility comments: assist to guide BLEs off of bed and for trunk elevation  Transfers Overall transfer level: Needs assistance Equipment used: Sliding board Transfers: Lateral/Scoot Transfers          Lateral/Scoot Transfers: Mod assist;+2 physical assistance General transfer comment: Dependent with slide board placement. Use of bed pad to assist with scoot transfer toward L. BP 74/61    Balance Overall balance assessment: Needs assistance Sitting-balance support: Bilateral upper extremity supported;Feet  supported Sitting balance-Leahy Scale: Fair Sitting balance - Comments: static sit without external support EOB                                   ADL either performed or assessed with clinical judgement   ADL Overall ADL's : Needs assistance/impaired                                     Functional mobility during ADLs: Moderate assistance;+2 for physical assistance;+2 for safety/equipment General ADL Comments: Pt at baseline for ADL tasks at this time. Pt reports feeling slightly weaker than baseline.     Vision       Perception     Praxis      Cognition Arousal/Alertness: Awake/alert Behavior During Therapy: WFL for tasks assessed/performed Overall Cognitive Status: Within Functional Limits for tasks assessed                                          Exercises     Shoulder Instructions       General Comments Dizziness upon sitting, clearing after 4-5 minutes. BP initially 74/44, water given to pt and after sliding board transfer, pt's BP 74/61 in recliner.    Pertinent Vitals/ Pain       Pain Assessment: No/denies pain  Home Living  Prior Functioning/Environment              Frequency  Min 2X/week        Progress Toward Goals  OT Goals(current goals can now be found in the care plan section)  Progress towards OT goals: Progressing toward goals  Acute Rehab OT Goals Patient Stated Goal: return home OT Goal Formulation: With patient Time For Goal Achievement: 02/19/20 Potential to Achieve Goals: Good ADL Goals Pt/caregiver will Perform Home Exercise Program: Increased strength;Both right and left upper extremity;With minimal assist Additional ADL Goal #1: Pt will increase to min A for bed mobility to EOB. Additional ADL Goal #2: Pt will increase to modA for sliding board or lateral scoot transfer OOB with cues for proper technique.  Plan  Discharge plan remains appropriate    Co-evaluation    PT/OT/SLP Co-Evaluation/Treatment: Yes Reason for Co-Treatment: Complexity of the patient's impairments (multi-system involvement);To address functional/ADL transfers   OT goals addressed during session: Strengthening/ROM      AM-PAC OT "6 Clicks" Daily Activity     Outcome Measure   Help from another person eating meals?: None Help from another person taking care of personal grooming?: A Little Help from another person toileting, which includes using toliet, bedpan, or urinal?: A Lot Help from another person bathing (including washing, rinsing, drying)?: A Lot Help from another person to put on and taking off regular upper body clothing?: A Little Help from another person to put on and taking off regular lower body clothing?: A Lot 6 Click Score: 16    End of Session Equipment Utilized During Treatment: Gait belt;Other (comment)(sldie board)  OT Visit Diagnosis: Unsteadiness on feet (R26.81);Muscle weakness (generalized) (M62.81)   Activity Tolerance Patient tolerated treatment well   Patient Left in chair;with call bell/phone within reach;with chair alarm set   Nurse Communication Mobility status;Need for lift equipment(to use maximove; pt under pad)        Time: 9166-0600 OT Time Calculation (min): 29 min  Charges: OT General Charges $OT Visit: 1 Visit OT Treatments $Therapeutic Activity: 8-22 mins  Jefferey Pica, OTR/L Acute Rehabilitation Services Pager: [phone] Office: [phone]   Malaina Mortellaro C 02/10/2020, 4:17 PM

## 2020-02-11 LAB — RENAL FUNCTION PANEL
Albumin: 1.6 g/dL — ABNORMAL LOW (ref 3.5–5.0)
Anion gap: 11 (ref 5–15)
BUN: 44 mg/dL — ABNORMAL HIGH (ref 6–20)
CO2: 20 mmol/L — ABNORMAL LOW (ref 22–32)
Calcium: 7.7 mg/dL — ABNORMAL LOW (ref 8.9–10.3)
Chloride: 112 mmol/L — ABNORMAL HIGH (ref 98–111)
Creatinine, Ser: 2.94 mg/dL — ABNORMAL HIGH (ref 0.44–1.00)
GFR calc Af Amer: 19 mL/min — ABNORMAL LOW (ref >60)
GFR calc non Af Amer: 17 mL/min — ABNORMAL LOW (ref >60)
Glucose, Bld: 123 mg/dL — ABNORMAL HIGH (ref 70–99)
Phosphorus: 4.8 mg/dL — ABNORMAL HIGH (ref 2.5–4.6)
Potassium: 3.6 mmol/L (ref 3.5–5.1)
Sodium: 143 mmol/L (ref 135–145)

## 2020-02-11 LAB — GLUCOSE, CAPILLARY: Glucose-Capillary: 110 mg/dL — ABNORMAL HIGH (ref 70–99)

## 2020-02-11 LAB — CBC
HCT: 36.2 % (ref 36.0–46.0)
Hemoglobin: 11.4 g/dL — ABNORMAL LOW (ref 12.0–15.0)
MCH: 32.5 pg (ref 26.0–34.0)
MCHC: 31.5 g/dL (ref 30.0–36.0)
MCV: 103.1 fL — ABNORMAL HIGH (ref 80.0–100.0)
Platelets: 260 10*3/uL (ref 150–400)
RBC: 3.51 MIL/uL — ABNORMAL LOW (ref 3.87–5.11)
RDW: 16.1 % — ABNORMAL HIGH (ref 11.5–15.5)
WBC: 4.7 10*3/uL (ref 4.0–10.5)
nRBC: 0 % (ref 0.0–0.2)

## 2020-02-11 LAB — TACROLIMUS LEVEL: Tacrolimus (FK506) - LabCorp: 6.9 ng/mL (ref 2.0–20.0)

## 2020-02-11 MED ORDER — CEFDINIR 300 MG PO CAPS: 300.0000 mg | ORAL_CAPSULE | Freq: Every day | ORAL | 0 refills | Status: AC

## 2020-02-11 MED ORDER — PREDNISONE 5 MG PO TABS: ORAL_TABLET | ORAL | 0 refills | Status: DC

## 2020-02-11 MED ORDER — HYDROCORTISONE (PERIANAL) 2.5 % EX CREA: TOPICAL_CREAM | Freq: Three times a day (TID) | CUTANEOUS | 0 refills | Status: DC

## 2020-02-11 MED ORDER — PRO-STAT SUGAR FREE PO LIQD: 30.0000 mL | Freq: Two times a day (BID) | ORAL | 0 refills | Status: DC

## 2020-02-11 MED ORDER — GLUCERNA SHAKE PO LIQD: 237.0000 mL | Freq: Three times a day (TID) | ORAL | 0 refills | Status: DC

## 2020-02-11 MED ORDER — POLYETHYLENE GLYCOL 3350 17 G PO PACK: 17.0000 g | PACK | Freq: Every day | ORAL | 0 refills | Status: DC

## 2020-02-11 MED ORDER — FUROSEMIDE 40 MG PO TABS: 40.0000 mg | ORAL_TABLET | Freq: Every day | ORAL | 0 refills | Status: DC | PRN

## 2020-02-11 MED ORDER — PREGABALIN 25 MG PO CAPS: 25.0000 mg | ORAL_CAPSULE | Freq: Every day | ORAL | 0 refills | Status: DC

## 2020-02-11 NOTE — Progress Notes (Signed)
Patient was discharged home by MD order; discharged instructions reviewed and given to patient with care notes; IV DIC; patient will be transported home by [facility]

## 2020-02-11 NOTE — Progress Notes (Signed)
Patient ID: Wanda [name], female   DOB: [birth year], 60 y.o.   MRN: [id number]    S: UOP not recorded -  crt decreased  from yesterday -  She tells me that she is going home with home health today - she says swelling is better    O:BP 133/80 (BP Location: Left Arm)   Pulse 97   Temp (!) 97.4 F (36.3 C) (Oral)   Resp 18   Ht 5\' 9"  (1.753 m)   Wt 79.9 kg   SpO2 100%   BMI 26.01 kg/m   Intake/Output Summary (Last 24 hours) at 02/11/2020 0818 Last data filed at 02/10/2020 1806 Gross per 24 hour  Intake 480 ml  Output --  Net 480 ml   Intake/Output: I/O last 3 completed shifts: In: 720 [P.O.:720] Out: 1050 [Urine:1050]  Intake/Output this shift:  No intake/output data recorded. Weight change: 0.278 kg Gen: NAD CVS: no rub Resp: cta Abd: +BS, soft, NT/ND Ext: 1+ edema, right foot bandaged  Recent Labs  Lab 02/04/20 1315 02/04/20 1315 02/05/20 0633 02/06/20 0443 02/07/20 0635 02/08/20 0621 02/09/20 1039 02/10/20 1202 02/11/20 0629  NA 139   < > 141 141 140 140 141 141 143  K 4.0   < > 4.0 3.8 3.7 3.5 4.1 4.6 3.6  CL 112*   < > 115* 112* 113* 111 109 112* 112*  CO2 19*   < > 18* 18* 20* 20* 19* 18* 20*  GLUCOSE 219*   < > 133* 166* 232* 226* 177* 172* 123*  BUN 28*   < > 31* 30* 37* 40* 44* 44* 44*  CREATININE 2.74*   < > 2.75* 2.75* 2.95* 3.20* 3.15* 3.10* 2.94*  ALBUMIN 1.6*   < > 1.2* 1.6* 1.5* 1.5* 1.8* 1.8* 1.6*  CALCIUM 8.6*   < > 7.9* 7.8* 7.5* 7.8* 7.8* 7.6* 7.7*  PHOS  --   --  2.5 3.2 3.9 4.3 4.3  4.3 4.5 4.8*  AST 24  --  58*  --   --   --   --   --   --   ALT 19  --  26  --   --   --   --   --   --    < > = values in this interval not displayed.   Liver Function Tests: Recent Labs  Lab 02/04/20 1315 02/04/20 1315 02/05/20 0633 02/06/20 0443 02/09/20 1039 02/10/20 1202 02/11/20 0629  AST 24  --  58*  --   --   --   --   ALT 19  --  26  --   --   --   --   ALKPHOS 484*  --  660*  --   --   --   --   BILITOT 0.9  --  0.5  --   --   --    --   PROT 5.2*  --  4.1*  --   --   --   --   ALBUMIN 1.6*   < > 1.2*   < > 1.8* 1.8* 1.6*   < > = values in this interval not displayed.   No results for input(s): LIPASE, AMYLASE in the last 168 hours. No results for input(s): AMMONIA in the last 168 hours. CBC: Recent Labs  Lab 02/06/20 0443 02/06/20 0443 02/07/20 0635 02/07/20 0635 02/09/20 1039 02/10/20 1202 02/11/20 0629  WBC 3.4*   < > 3.7*   < > 4.9 5.2  4.7  NEUTROABS  --   --  3.5  --   --   --   --   HGB 9.5*   < > 10.1*   < > 11.8* 11.5* 11.4*  HCT 30.8*   < > 32.2*   < > 37.1 36.5 36.2  MCV 106.6*  --  104.2*  --  101.6* 103.7* 103.1*  PLT 235   < > 292   < > 330 268 260   < > = values in this interval not displayed.   Cardiac Enzymes: No results for input(s): CKTOTAL, CKMB, CKMBINDEX, TROPONINI in the last 168 hours. CBG: Recent Labs  Lab 02/10/20 0610 02/10/20 1110 02/10/20 1813 02/10/20 2122 02/11/20 0615  GLUCAP 157* 144* 205* 135* 110*    Iron Studies: No results for input(s): IRON, TIBC, TRANSFERRIN, FERRITIN in the last 72 hours. Studies/Results: No results found. [name] Kitchen allopurinol  100 mg Oral Daily  . apixaban  5 mg Oral Q12H  . atorvastatin  20 mg Oral QHS  . calcitRIOL  0.25 mcg Oral Daily  . cefdinir  300 mg Oral Q24H  . cholecalciferol  1,000 Units Oral Daily  . feeding supplement (GLUCERNA SHAKE)  237 mL Oral TID BM  . feeding supplement (PRO-STAT SUGAR FREE 64)  30 mL Oral BID  . hydrocortisone   Rectal TID  . insulin aspart  0-5 Units Subcutaneous QHS  . insulin aspart  0-9 Units Subcutaneous TID WC  . multivitamin with minerals  1 tablet Oral Daily  . nortriptyline  25 mg Oral QHS  . pantoprazole  40 mg Oral Daily  . phenylephrine  1 suppository Rectal BID  . polyethylene glycol  17 g Oral Daily  . predniSONE  20 mg Oral Q breakfast   Followed by  . [START ON 02/12/2020] predniSONE  10 mg Oral Q breakfast  . pregabalin  25 mg Oral Daily  . senna-docusate  1 tablet Oral BID  .  simethicone  80 mg Oral QID  . tacrolimus  1.5 mg Oral BID    BMET    Component Value Date/Time   NA 143 02/11/2020 0629   K 3.6 02/11/2020 0629   CL 112 (H) 02/11/2020 0629   CO2 20 (L) 02/11/2020 0629   GLUCOSE 123 (H) 02/11/2020 0629   BUN 44 (H) 02/11/2020 0629   CREATININE 2.94 (H) 02/11/2020 0629   CREATININE 8.80 (H) 07/10/2014 1332   CALCIUM 7.7 (L) 02/11/2020 0629   GFRNONAA 17 (L) 02/11/2020 0629   GFRAA 19 (L) 02/11/2020 0629   CBC    Component Value Date/Time   WBC 4.7 02/11/2020 0629   RBC 3.51 (L) 02/11/2020 0629   HGB 11.4 (L) 02/11/2020 0629   HGB 10.5 (L) 08/03/2010 1119   HCT 36.2 02/11/2020 0629   HCT 30.7 (L) 08/03/2010 1119   PLT 260 02/11/2020 0629   PLT 277 08/03/2010 1119   MCV 103.1 (H) 02/11/2020 0629   MCV 84.6 08/03/2010 1119   MCH 32.5 02/11/2020 0629   MCHC 31.5 02/11/2020 0629   RDW 16.1 (H) 02/11/2020 0629   RDW 13.4 08/03/2010 1119   LYMPHSABS 0.2 (L) 02/07/2020 0635   LYMPHSABS 2.5 08/03/2010 1119   MONOABS 0.0 (L) 02/07/2020 0635   MONOABS 0.4 08/03/2010 1119   EOSABS 0.0 02/07/2020 0635   EOSABS 0.2 08/03/2010 1119   BASOSABS 0.0 02/07/2020 0635   BASOSABS 0.1 08/03/2010 1119   Assessment/Plan: 1. AKI/CKD stage IIIa- likely due to ischemic ATN in setting of  volume depletion, hypotension, and recurrent Urosepsis. Baseline Scr appears to be 1.6-1.8. Continue with supportive care and follow UOP and Scr.  1. UOP finally back 2. IVF's stopped due to 3rd spacing 3. No indication for HD  4. Seems to have hit plateau around 5/8-  Trended better this AM  2. S/p DDKT with chronic immunosuppressive agents-  prograf 1.5 mg bid and  stress dose steroids given hypotension and acute infection- now on 10 daily of pred (normally on 5 mg daily at home). 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 She also drives to [facility] every month for IV Belatacept.She would like to transfer to a closer transplant center- would be an OP  decision 3. Hypotension/Shock- hypovolemic +/- urosepsis- BP improved with IVF"s and stress dose steroids.  Now overloaded.  Weight going up.  Is third spacing.  Again-   Hoping this will correct as renal function corrects  4. Recurrent UTI's and infections- currently on omnicef.  5. Severe protein malnutrition- albumin 1.2-1.6. Protein supplements and encourage po intake. 6. Pleural effusions and pericardial effusion on CT scan- unclear etiologybut likely due to 3rd spacing from hypoalbuminemia.  ECHOwith EF of 70-75%, Grade I DD, elevated left atrial pressure, normal RV, moderate pericardial effusion. 7. P. Atrial fibrillation- continue  apixaban.    8. Right heel decubitus ulcer- since her last hospitalization she has had progressive debility and is now wheelchair bound. Wound care following. No evidence of osteo.  9. Disposition- she tells me she is going home.  I will make sure that she has follow up at [place] if that is the case    Rolena Infante [phone]

## 2020-02-11 NOTE — Progress Notes (Signed)
Nutrition Follow-up  DOCUMENTATION CODES:   Non-severe (moderate) malnutrition in context of chronic illness  INTERVENTION:   -D/c Glucerna Shake po TID, each supplement provides 220 kcal and 10 grams of protein -Ensure Enlive po TID, each supplement provides 350 kcal and 20 grams of protein -Continue 30 ml Prostat BID, each supplement provides 100 kcals and 15 grams protein  NUTRITION DIAGNOSIS:   Moderate Malnutrition related to chronic illness(ESRD s/p transplant) as evidenced by moderate fat depletion, moderate muscle depletion, energy intake < or equal to 75% for > or equal to 1 month, percent weight loss.  Ongoing  GOAL:   Patient will meet greater than or equal to 90% of their needs  Progressing   MONITOR:   PO intake, Supplement acceptance, Labs, Weight trends, Skin, I & O's  REASON FOR ASSESSMENT:   Consult Wound healing  ASSESSMENT:   Wanda [name] is a 59 y.o. female with medical history significant of T2DM, HTN, HLD, Hypothyroidism, ESRD with failed kidney transplant 2013 and repeat transplant September 2020 at [facility], microcytic anemia, Hx of gastric sleeve who presents with dysuria and hypotension concerning for UTI, but also with evidence of volume overload.  Reviewed I/O's: +480 ml x 24 hours and +3.5 L since admission  Attempted to speak with pt via phone, however, no answer.   Pt remains with poor appetite; noted meal completion 5-50%. Per RN, pt takes supplements well and enjoys them.   Pt with poor oral intake and would benefit from nutrient dense supplement. One Ensure Enlive supplement provides 350 kcals, 20 grams protein, and 44-45 grams of carbohydrate vs one Glucerna shake supplement, which provides 220 kcals, 10 grams of protein, and 26 grams of carbohydrate. Given pt's hx of DM, RD will reassess adequacy of PO intake, CBGS, and adjust supplement regimen as appropriate at follow-up.   Per TOC team notes, pt will likely discharge home today  with home health services, pending nephrology clearance,   Labs reviewed: Phos: 4.8, CBGS: 110-205 (inpatient orders for glycemic control are 0-5 units insulin aspart q HS and 0-9 units insulin aspart TID with meals).   Diet Order:   Diet Order            Diet - low sodium heart healthy        Diet 2 gram sodium Room service appropriate? Yes; Fluid consistency: Thin  Diet effective now              EDUCATION NEEDS:   Education needs have been addressed  Skin:  Skin Assessment: Skin Integrity Issues: Skin Integrity Issues:: Unstageable, Other (Comment) Unstageable: rt heel Other: ruptured bulla on lt medial great toe, dry ulcerated area on lt fifth toe, and scattered dried crusts on lt posterior heel  Last BM:  02/09/20  Height:   Ht Readings from Last 1 Encounters:  02/04/20 5\' 9"  (1.753 m)    Weight:   Wt Readings from Last 1 Encounters:  02/11/20 79.9 kg    Ideal Body Weight:  65.9 kg  BMI:  Body mass index is 26.01 kg/m.  Estimated Nutritional Needs:   Kcal:  2100-2300  Protein:  115-130 grams  Fluid:  > 2.1 L    Loistine Chance, RD, LDN, [place] Registered Dietitian II Certified Diabetes Care and Education Specialist Please refer to [facility] for RD and/or RD on-call/weekend/after hours pager

## 2020-02-12 NOTE — Discharge Summary (Signed)
[facility] Discharge Summary   Patient: Wanda [name] WGY:[id number]  PCP: Lorene Dy, MD  Date of admission: 02/04/2020   Date of discharge: 02/11/2020      Discharge Diagnoses:  Principal diagnosis Sepsis secondary to UTI.  Active Problems:   AKI (acute kidney injury) ([place])   Malnutrition of moderate degree   Admitted From: Home Disposition:  Home with home health  Recommendations for Outpatient Follow-up:  1. PCP: Follow-up with PCP in 1 week, establish a referral to cardiology outpatient. 2. Follow up LABS/TEST: Repeat BMP 3. Repeat echocardiogram  Follow-up Information    Lorene Dy, MD.   Specialty: Internal Medicine Why: GODuke Salvia, MAY [facility] AT [facility] Contact information: [address] [place] [address] [phone]        [facility] Follow up.   Why: [employer], jewellery information: [place] [address] [phone]        [facility], [facility] Follow up.   Specialty: Home Health Services Why: [facility], [place] [facility] Contact information: [address] [place] [address] [phone]          Diet recommendation: Renal diet  Activity: The patient is advised to gradually reintroduce usual activities, as tolerated  Discharge Condition: stable  Code Status: Full code   History of present illness: As per the H and P dictated on admission, ": Wanda [name] is a 59 y.o. female with medical history significant of T2DM, HTN, Paroxysmal A. Fib, HLD, ESRD with failed kidney transplant 2013 and repeat transplant September 2020 at [facility], microcytic anemia, Hx of gastric sleeve who presents with dysuria and hypotension.  Patient reports she was a outpatient appointment and noted to be hypotensive.  She reports at h51me her baseline has been between 34-287G systolic but she has noticed that when she sits up she has been lightheaded.  No fevers or chills.  She denies any chest pain, pressure, cough or  shortness of breath.  She reports she had some nausea/vomiting x 1 in ER but none prior.  She lives at home with her husband and daughter.  She reports she is wheelchair bound.  She cannot stand, relies on family for transfers.  Denies smoking, EtOH or Drugs  She is scheduled to follow-up with [facility] transplant team on May 22.  She does want to transition her care locally.  She is no longer on HD, previously MWF prior to transplant."  Hospital Course:  Summary of her active problems in the hospital is as following. 1.  Sepsis POA. Secondary to UTI due to Klebsiella oxytoca as well as Enterococcus  Initially on IV antibiotics.   change to oral antibiotics. 1 dose of fosfomycin. Bactrim currently on hold. Blood pressure improving.  Monitor.  2.  Hypotension resolved Blood pressure improving Lactic acid not elevated. IV albumin as well as 1 L of IV bolus was given Added midodrine.  Monitor on telemetry. Patient was given stress dose steroids.  3.  Anasarca. Hypoalbuminemia. Moderate pericardial effusion without any tamponade. Bilateral pleural effusion. Suspect this is all in the setting of third spacing from hypoalbuminemia. Currently echocardiogram does not show any evidence of tamponade. Monitor. Repeat echocardiogram shows persistent moderate pericardial effusion. No evidence of tamponade. Discussed with cardiology. Currently no intervention needed at present. Outpatient follow-up recommended.  4.  ESRD SP renal transplant. Chronic immunosuppressive medication Acute kidney injury on chronic kidney disease stage III Anemia chronic kidney disease. Renal function finally trending down. Urine output adequate.  Nephrology consulted Appreciate assistance. Currently CellCept is on hold given concern for bacteremia and viremia.  5.  Paroxysmal A. fib Eliquis currently receiving. Metoprolol on hold.  6.  Type 2 diabetes mellitus with nephropathy and  neuropathy. Continue Lyrica yes. Continue sliding scale insulin. Monitor.  7.  Moderate protein calorie malnutrition. Failure to thrive Body mass index is 25.91 kg/m.  Nutrition Problem: Moderate Malnutrition Etiology: chronic illness(ESRD s/p transplant) Interventions: Interventions: Glucerna shake, MVI, Prostat   8.  Bilateral lower extremity edema. Bilateral lower extremity tenderness. Lower extremity ABI unable to be performed, bilateral pedal vessels are noncompressible with abnormal toe waveforms.  Will require vascular surgery follow-up outpatient. Lower extremity Dopplers ordered currently no evidence of DVT.  9.  Right foot necrotic ulcer. Wound care consulted. Continue antibiotics. MRI foot as well as x-ray foot negative for any osteomyelitis and shows soft tissue edema. Monitor.  10.  Hemorrhoids. Constipation. Initiating bowel regimen. Continue to prep H as well as Anusol cream.  11.  sick euthyroid syndrome. Repeat TSH and free T4 normal.  No indication for replacement.  12.  Chronic neuropathy. Will initiate Lyrica. Reported patient was on 75 mg daily.  We will start with 25 mg daily dose.  Body mass index is 26.01 kg/m.  Nutrition Problem: Moderate Malnutrition Etiology: chronic illness(ESRD s/p transplant) Nutrition Interventions: Interventions: Glucerna shake, MVI, Prostat  Pressure Injury 02/04/20 Heel Right Unstageable - Full thickness tissue loss in which the base of the injury is covered by slough (yellow, tan, gray, green or brown) and/or eschar (tan, brown or black) in the wound bed. (Active)  02/04/20 2200  Location: Heel  Location Orientation: Right  Staging: Unstageable - Full thickness tissue loss in which the base of the injury is covered by slough (yellow, tan, gray, green or brown) and/or eschar (tan, brown or black) in the wound bed.  Wound Description (Comments):   Present on Admission: Yes    Pain control  - [place]  Controlled Substance Reporting System database was reviewed. - 30 day supply was provided of Lyrica.  No narcotics prescription provided. - Patient was instructed, not to drive, operate heavy machinery, perform activities at heights, swimming or participation in water activities or provide baby sitting services while on Pain, Sleep and Anxiety Medications; until her outpatient Physician has advised to do so again.  - Also recommended to not to take more than prescribed Pain, Sleep and Anxiety Medications.  Patient was seen by physical therapy, who recommended Home health, which was arranged. On the day of the discharge the patient's vitals were stable, and no other acute medical condition were reported by patient. the patient was felt safe to be discharge at Home with Home health.  Consultants: Nephrology Procedures: Echocardiogram  Discharge Exam: General: Appear in no distress, no Rash; Oral Mucosa Clear, moist. Cardiovascular: S1 and S2 Present, no Murmur, Respiratory: normal respiratory effort, Bilateral Air entry present and no Crackles, no wheezes Abdomen: Bowel Sound present, Soft and no tenderness, no hernia Extremities: no Pedal edema, no calf tenderness Neurology: alert and oriented to time, place, and person affect appropriate.  Filed Weights   02/09/20 0444 02/10/20 0354 02/11/20 0623  Weight: 79.6 kg 79.6 kg 79.9 kg   Vitals:   02/10/20 1911 02/11/20 0623  BP: 118/80 133/80  Pulse: 96 97  Resp: 18 18  Temp: (!) 97.5 F (36.4 C) (!) 97.4 F (36.3 C)  SpO2: 100% 100%    DISCHARGE MEDICATION: Allergies as of 02/11/2020  Reactions   Hydrocodone-acetaminophen Other (See Comments)   Severe headache   Hydrocodone-acetaminophen    Other reaction(s): headache, Other (See Comments), Unknown Severe headache   Propoxyphene N-acetaminophen Other (See Comments)   Severe headache   Astemizole    Other reaction(s): headache, Other (See Comments), Unknown Rash    Cephalosporins Rash   Other reaction(s): Other (See Comments), Unknown Rash      Medication List    STOP taking these medications   amLODipine 5 MG tablet Commonly known as: NORVASC   calcium gluconate 500 MG tablet   carvedilol 3.125 MG tablet Commonly known as: COREG   doxycycline 100 MG tablet Commonly known as: VIBRA-TABS   insulin aspart 100 UNIT/ML FlexPen Commonly known as: NovoLOG FlexPen   metoprolol tartrate 50 MG tablet Commonly known as: LOPRESSOR   midodrine 10 MG tablet Commonly known as: PROAMATINE   mycophenolate 500 MG tablet Commonly known as: CELLCEPT   sodium bicarbonate 650 MG tablet     TAKE these medications   acetaminophen 325 MG tablet Commonly known as: TYLENOL Take 650 mg by mouth every 6 (six) hours as needed for mild pain.   allopurinol 100 MG tablet Commonly known as: ZYLOPRIM Take 2 tablets (200 mg total) by mouth daily.   amiodarone 200 MG tablet Commonly known as: PACERONE Take 200 mg by mouth daily.   atorvastatin 20 MG tablet Commonly known as: LIPITOR Take 20 mg by mouth at bedtime.   Bactrim 400-80 MG tablet Generic drug: sulfamethoxazole-trimethoprim Take 1 tablet by mouth 2 (two) times daily.   calcitRIOL 0.25 MCG capsule Commonly known as: ROCALTROL Take by mouth daily.   cefdinir 300 MG capsule Commonly known as: OMNICEF Take 1 capsule (300 mg total) by mouth daily for 2 days.   Colcrys 0.6 MG tablet Generic drug: colchicine Take 0.6 mg by mouth daily as needed (gout).   dicyclomine 10 MG capsule Commonly known as: BENTYL Take 10 mg by mouth 3 (three) times daily as needed for spasms.   Eliquis 5 MG Tabs tablet Generic drug: apixaban Take 5 mg by mouth every 12 (twelve) hours.   famotidine 20 MG tablet Commonly known as: PEPCID Take 20 mg by mouth daily as needed for heartburn.   feeding supplement (GLUCERNA SHAKE) Liqd Take 237 mLs by mouth 3 (three) times daily between meals.   feeding  supplement (PRO-STAT SUGAR FREE 64) Liqd Take 30 mLs by mouth 2 (two) times daily.   folic acid 1 MG tablet Commonly known as: FOLVITE Take 1 mg by mouth daily.   furosemide 40 MG tablet Commonly known as: LASIX Take 1 tablet (40 mg total) by mouth daily as needed for fluid or edema (weight gain of 3lbs in 1 day or 5 lbs in 2 days). What changed:   when to take this  reasons to take this   hydrocortisone 2.5 % rectal cream Commonly known as: ANUSOL-HC Place rectally 3 (three) times daily.   hydrOXYzine 10 MG tablet Commonly known as: ATARAX/VISTARIL Take 10 mg by mouth 2 (two) times daily as needed for itching.   loperamide 2 MG capsule Commonly known as: IMODIUM Take 2 mg by mouth every 6 (six) hours as needed for diarrhea or loose stools.   Metanx 3-90.314-2-35 MG Caps Take 1 tablet by mouth daily.   multivitamin with minerals Tabs tablet Take 1 tablet by mouth daily.   nortriptyline 25 MG capsule Commonly known as: PAMELOR TAKE 1 CAPSULE (25 MG TOTAL) BY MOUTH AT BEDTIME.  ondansetron 4 MG tablet Commonly known as: ZOFRAN Take 4 mg by mouth daily.   pantoprazole 40 MG tablet Commonly known as: PROTONIX Take 40 mg by mouth daily.   polyethylene glycol 17 g packet Commonly known as: MIRALAX / GLYCOLAX Take 17 g by mouth daily.   predniSONE 5 MG tablet Commonly known as: DELTASONE Take 10 mg daily for 3 days and then resume 5 mg daily dose What changed:   how much to take  how to take this  when to take this  additional instructions   pregabalin 25 MG capsule Commonly known as: LYRICA Take 1 capsule (25 mg total) by mouth daily. What changed:   medication strength  how much to take   pyridOXINE 50 MG tablet Commonly known as: B-6 Take 50 mg by mouth daily.   ramelteon 8 MG tablet Commonly known as: ROZEREM Take 8 mg by mouth at bedtime.   tacrolimus 0.5 MG capsule Commonly known as: PROGRAF Take 1.5 mg by mouth every 12 (twelve)  hours.   thiamine 100 MG tablet Take 100 mg by mouth daily.   valGANciclovir 450 MG tablet Commonly known as: VALCYTE Take 450 mg by mouth daily.   Vitamin D (Ergocalciferol) 1.25 MG (50000 UNIT) Caps capsule Commonly known as: DRISDOL TAKE 1 CAPSULE (50,000 UNITS TOTAL) BY MOUTH EVERY 7 (SEVEN) DAYS.   Vitamin D-3 25 MCG (1000 UT) Caps Take 1,000 Units by mouth daily.      Allergies  Allergen Reactions   Hydrocodone-Acetaminophen Other (See Comments)    Severe headache   Hydrocodone-Acetaminophen     Other reaction(s): headache, Other (See Comments), Unknown Severe headache    Propoxyphene N-Acetaminophen Other (See Comments)    Severe headache   Astemizole     Other reaction(s): headache, Other (See Comments), Unknown Rash    Cephalosporins Rash    Other reaction(s): Other (See Comments), Unknown Rash    Discharge Instructions    Diet - low sodium heart healthy   Complete by: As directed    Increase activity slowly   Complete by: As directed       The results of significant diagnostics from this hospitalization (including imaging, microbiology, ancillary and laboratory) are listed below for reference.    Significant Diagnostic Studies: CT ABDOMEN PELVIS WO CONTRAST  Result Date: 02/04/2020 CLINICAL DATA:  Hypotension, renal transplant EXAM: CT ABDOMEN AND PELVIS WITHOUT CONTRAST TECHNIQUE: Multidetector CT imaging of the abdomen and pelvis was performed following the standard protocol without IV contrast. COMPARISON:  07/31/2016, 02/04/2020 FINDINGS: Lower chest: There is a moderate pericardial effusion. Calcification of the mitral annulus is noted. The heart is not enlarged. There are bilateral pleural effusions, left greater than right. Compressive atelectasis left lower lobe. Hepatobiliary: No focal liver abnormality is seen. Status post cholecystectomy. No biliary dilatation. Pancreas: Unremarkable. No pancreatic ductal dilatation or surrounding  inflammatory changes. Spleen: Indeterminate wedge-shaped hypodensity within the posteromedial aspect of the spleen measures up to 3.7 cm. This is nonspecific, with limited evaluation on unenhanced study. The remainder of the spleen is unremarkable. Adrenals/Urinary Tract: Native kidneys are markedly atrophic. Left renal cysts are again noted, minimally complex, and decreased in size since prior study. The adrenals are unremarkable. There is atrophied and calcified failed right lower quadrant transplant kidney. Left lower quadrant transplant kidney is grossly unremarkable in appearance with no evidence of hydronephrosis. The bladder is minimally distended with no gross abnormalities. Stomach/Bowel: No bowel obstruction or ileus. No bowel wall thickening or inflammatory change. Normal appendix  right lower quadrant. Vascular/Lymphatic: Extensive atherosclerosis is seen throughout the abdominal vasculature. No pathologic adenopathy. Reproductive: Uterus and bilateral adnexa are unremarkable. Other: There is trace free fluid within the left upper quadrant and pelvis. Extensive subcutaneous edema throughout the abdominal wall and subcutaneous tissues compatible with anasarca. No free intraperitoneal gas. No abdominal wall hernia. Musculoskeletal: No acute or destructive bony lesions. Reconstructed images demonstrate no additional findings. IMPRESSION: 1. Moderate pericardial effusion. 2. Bilateral pleural effusions, left greater than right, with compressive atelectasis of the left lower lobe. 3. Indeterminate wedge-shaped hypodensity within the posteromedial aspect of the spleen measures up to 3.7 cm. This is nonspecific on an unenhanced exam, but does appear new since prior study. 4. Trace ascites. 5. Anasarca. 6. Left lower quadrant transplant kidney as above. Electronically Signed   By: Randa Ngo M.D.   On: 02/04/2020 17:49   US Renal Transplant w/Doppler  Result Date: 02/04/2020 CLINICAL DATA:  Hypotension  EXAM: ULTRASOUND OF RENAL TRANSPLANT WITH RENAL DOPPLER ULTRASOUND TECHNIQUE: Ultrasound examination of the renal transplant was performed with gray-scale, color and duplex doppler evaluation. COMPARISON:  07/31/2016 FINDINGS: Transplant kidney location: RLQ Transplant Kidney: Renal measurements: 11.8 x 5.4 by 5.5 cm = volume: 175mL. Normal in size and parenchymal echogenicity. No evidence of mass or hydronephrosis. No peri-transplant fluid collection seen. Color flow in the main renal artery:  Yes Color flow in the main renal vein:  Yes Duplex Doppler Evaluation: Main Renal Artery Resistive Index: 0.83 Venous waveform in main renal vein:  Present Intrarenal resistive index in upper pole:  0.76 (normal 0.6-0.8; equivocal 0.8-0.9; abnormal >= 0.9) Intrarenal resistive index in lower pole: 0.60 (normal 0.6-0.8; equivocal 0.8-0.9; abnormal >= 0.9) Bladder: Minimal debris within the urinary bladder, which is overall decompressed. Ureteral jets are not visualized. Other findings:  Trace free fluid right lower quadrant. IMPRESSION: 1. Unremarkable right lower quadrant transplant kidney. 2. Trace free fluid right lower quadrant. Electronically Signed   By: Randa Ngo M.D.   On: 02/04/2020 15:21   MR FOOT RIGHT WO CONTRAST  Result Date: 02/06/2020 CLINICAL DATA:  Hindfoot ulceration. Concern for osteomyelitis EXAM: MRI OF THE RIGHT HINDFOOT WITHOUT CONTRAST TECHNIQUE: Multiplanar, multisequence MR imaging of the ankle was performed. No intravenous contrast was administered. COMPARISON:  X-ray 02/05/2020 FINDINGS: Diffuse soft tissue edema. Mild skin irregularity at the posterior aspect of the ankle and hindfoot. No deep soft tissue ulceration. No well-defined fluid collection. TENDONS Peroneal: Mild tendinosis and possible short-segment split tear of the infra malleolar portion of the peroneus brevis tendon. Peroneus longus tendon intact. Trace tenosynovial fluid. Posteromedial: Intact tibialis posterior, flexor  hallucis longus and flexor digitorum longus tendons. Anterior: Intact tibialis anterior, extensor hallucis longus and extensor digitorum longus tendons. Achilles: Intact. Plantar Fascia: Intact. LIGAMENTS Lateral: The anterior and posterior tibiofibular ligaments are intact. The anterior and posterior talofibular ligaments are intact. Intact calcaneofibular ligament. Medial: Intact. CARTILAGE Ankle Joint: Anterior translation of the talar dome relative to the tibial plafond without dislocation. No focal chondral defect. Trace tibiotalar joint effusion. Subtalar Joints/Sinus Tarsi: No joint effusion or chondral defect. Bones: No acute fracture. No dislocation. No bone marrow edema. No cortical destruction. No suspicious bone lesion. Other: Diffuse muscular atrophy and fatty infiltration suggesting chronic denervation changes. Edema like intramuscular signal may also reflect denervation or myositis. IMPRESSION: 1. Negative for acute osteomyelitis. 2. Nonspecific soft tissue edema. No deep soft tissue ulceration. No well-defined fluid collection. 3. Chronic denervation changes of the intrinsic musculature of the right foot. Mild diffuse intramuscular  edema which could reflect a nonspecific myositis. 4. Mild tendinosis and possible short-segment split tear of the infra-malleolar portion of the peroneus brevis tendon. Electronically Signed   By: Davina Poke D.O.   On: 02/06/2020 11:48   DG Chest Port 1 View  Result Date: 02/04/2020 CLINICAL DATA:  Hypotension. EXAM: PORTABLE CHEST 1 VIEW COMPARISON:  12/15/2019. FINDINGS: Mediastinum hilar structures normal. Left base atelectasis and infiltrate. Small left pleural effusion cannot be excluded. No pneumothorax. Heart size normal. No acute bony abnormality. IMPRESSION: Left base atelectasis and infiltrate. Small left pleural effusion cannot be excluded. Electronically Signed   By: Marcello Moores  Register   On: 02/04/2020 14:22   DG Foot Complete Right  Result Date:  02/05/2020 CLINICAL DATA:  Follow-up right foot osteomyelitis. EXAM: RIGHT FOOT COMPLETE - 3+ VIEW COMPARISON:  None. FINDINGS: Examination demonstrates no evidence of osteomyelitis over the area of clinical concern of the hindfoot/calcaneus. No air in the soft tissues. Possible erosive change along the medial aspect of the head of the first proximal phalanx. Remainder the exam is unremarkable. IMPRESSION: 1. No evidence of osteomyelitis over the area of concern at the heel/calcaneus. 2. Possible focal erosive change at the first interphalangeal joint which may be due to inflammatory arthropathy. Electronically Signed   By: Marin Olp M.D.   On: 02/05/2020 10:23   VAS [place] ABI WITH/WO TBI  Result Date: 02/06/2020 LOWER EXTREMITY DOPPLER STUDY Indications: Ulceration. High Risk Factors: Diabetes. Other Factors: Leg pain.  Performing Technologist: June Leap Rvt, Rdms  Examination Guidelines: A complete evaluation includes at minimum, Doppler waveform signals and systolic blood pressure reading at the level of bilateral brachial, anterior tibial, and posterior tibial arteries, when vessel segments are accessible. Bilateral testing is considered an integral part of a complete examination. Photoelectric Plethysmograph (PPG) waveforms and toe systolic pressure readings are included as required and additional duplex testing as needed. Limited examinations for reoccurring indications may be performed as noted.  ABI Findings: +---------+------------------+-----+----------+--------------------------------+  Right     Rt Pressure (mmHg) Index Waveform   Comment                           +---------+------------------+-----+----------+--------------------------------+  Brachial                                      unable to take BP due to                                                         restricted arm                    +---------+------------------+-----+----------+--------------------------------+  ATA       255                       biphasic                                     +---------+------------------+-----+----------+--------------------------------+  PTA       255  monophasic                                   +---------+------------------+-----+----------+--------------------------------+  Great Toe 116                      Abnormal                                     +---------+------------------+-----+----------+--------------------------------+ +---------+------------------+-----+----------+--------------------------------+  Left      Lt Pressure (mmHg) Index Waveform   Comment                           +---------+------------------+-----+----------+--------------------------------+  Brachial                                      unable to take BP due to IV                                                      location upper arm                +---------+------------------+-----+----------+--------------------------------+  ATA       255                      biphasic                                     +---------+------------------+-----+----------+--------------------------------+  PTA       255                      monophasic                                   +---------+------------------+-----+----------+--------------------------------+  Great Toe 98                       Abnormal                                     +---------+------------------+-----+----------+--------------------------------+  Summary:  Cannot determine index as Brachial pressures cannot be taken from either arm. Bilateral pedal vessels are non compressible and bilateral toe waveforms are abnormal.  *See table(s) above for measurements and observations.  Electronically signed by Servando Snare MD on 02/06/2020 at 5:28:43 PM.   Final    ECHOCARDIOGRAM COMPLETE  Result Date: 02/05/2020    ECHOCARDIOGRAM REPORT   Patient Name:   AVIVA WOLFER Date of Exam: 02/05/2020 Medical Rec #:  [id number]         Height:       69.0 in  Accession #:    [id number]        Weight:       167.5 lb Date of Birth:  1961/07/12        BSA:  1.916 m Patient Age:    62 years          BP:           71/25 mmHg Patient Gender: F                 HR:           85 bpm. Exam Location:  Inpatient Procedure: 2D Echo, Cardiac Doppler and Color Doppler Indications:    Pericardial effusion  History:        Patient has prior history of Echocardiogram examinations, most                 recent 01/09/2013. Pericardial Disease; Risk Factors:Diabetes,                 Hypertension and Obesity. Kidney transplant.  Sonographer:    Dustin Flock Referring Phys: [id number] [place]  1. Left ventricular ejection fraction, by estimation, is 70 to 75%. The left ventricle has hyperdynamic function. The left ventricle has no regional wall motion abnormalities. There is moderate left ventricular hypertrophy. Left ventricular diastolic parameters are consistent with Grade I diastolic dysfunction (impaired relaxation). Elevated left atrial pressure.  2. Right ventricular systolic function is normal. The right ventricular size is normal. There is mildly elevated pulmonary artery systolic pressure.  3. Left atrial size was mildly dilated.  4. Moderate pericardial effusion. The pericardial effusion is circumferential. There is no evidence of cardiac tamponade.  5. The mitral valve is normal in structure. No evidence of mitral valve regurgitation. No evidence of mitral stenosis.  6. The aortic valve is tricuspid. Aortic valve regurgitation is not visualized. Mild aortic valve sclerosis is present, with no evidence of aortic valve stenosis.  7. The inferior vena cava is normal in size with greater than 50% respiratory variability, suggesting right atrial pressure of 3 mmHg. FINDINGS  Left Ventricle: Left ventricular ejection fraction, by estimation, is 70 to 75%. The left ventricle has hyperdynamic function. The left ventricle has no regional wall motion  abnormalities. The left ventricular internal cavity size was normal in size. There is moderate left ventricular hypertrophy. Left ventricular diastolic parameters are consistent with Grade I diastolic dysfunction (impaired relaxation). Elevated left atrial pressure. Right Ventricle: The right ventricular size is normal. No increase in right ventricular wall thickness. Right ventricular systolic function is normal. There is mildly elevated pulmonary artery systolic pressure. The tricuspid regurgitant velocity is 2.80  m/s, and with an assumed right atrial pressure of 8 mmHg, the estimated right ventricular systolic pressure is 25.8 mmHg. Left Atrium: Left atrial size was mildly dilated. Right Atrium: Right atrial size was normal in size. Pericardium: A moderately sized pericardial effusion is present. The pericardial effusion is circumferential. There is no evidence of cardiac tamponade. Mitral Valve: The mitral valve is normal in structure. Normal mobility of the mitral valve leaflets. No evidence of mitral valve regurgitation. No evidence of mitral valve stenosis. Tricuspid Valve: The tricuspid valve is normal in structure. Tricuspid valve regurgitation is trivial. No evidence of tricuspid stenosis. Aortic Valve: The aortic valve is tricuspid. Aortic valve regurgitation is not visualized. Mild aortic valve sclerosis is present, with no evidence of aortic valve stenosis. Pulmonic Valve: The pulmonic valve was normal in structure. Pulmonic valve regurgitation is not visualized. No evidence of pulmonic stenosis. Aorta: The aortic root is normal in size and structure. Venous: The inferior vena cava is normal in size with greater than 50% respiratory variability, suggesting right atrial pressure of 3  mmHg. IAS/Shunts: No atrial level shunt detected by color flow Doppler.  LEFT VENTRICLE PLAX 2D LVIDd:         3.04 cm  Diastology LVIDs:         2.23 cm  LV e' lateral:   4.46 cm/s LV PW:         1.39 cm  LV E/e' lateral:  19.8 LV IVS:        1.46 cm  LV e' medial:    3.81 cm/s LVOT diam:     2.10 cm  LV E/e' medial:  23.1 LV SV:         75 LV SV Index:   39 LVOT Area:     3.46 cm  RIGHT VENTRICLE RV Basal diam:  2.39 cm RV S prime:     13.80 cm/s TAPSE (M-mode): 2.2 cm LEFT ATRIUM             Index       RIGHT ATRIUM          Index LA diam:        3.40 cm 1.77 cm/m  RA Area:     9.40 cm LA Vol (A2C):   68.4 ml 35.69 ml/m RA Volume:   18.80 ml 9.81 ml/m LA Vol (A4C):   54.4 ml 28.39 ml/m LA Biplane Vol: 66.0 ml 34.44 ml/m  AORTIC VALVE LVOT Vmax:   117.00 cm/s LVOT Vmean:  80.500 cm/s LVOT VTI:    0.217 m  AORTA Ao Root diam: 3.00 cm MITRAL VALVE                TRICUSPID VALVE MV Area (PHT): 4.36 cm     TR Peak grad:   31.4 mmHg MV Decel Time: 174 msec     TR Vmax:        280.00 cm/s MV E velocity: 88.10 cm/s MV A velocity: 143.00 cm/s  SHUNTS MV E/A ratio:  0.62         Systemic VTI:  0.22 m                             Systemic Diam: 2.10 cm Candee Furbish MD Electronically signed by Candee Furbish MD Signature Date/Time: 02/05/2020/2:58:41 PM    Final    VAS [place] LOWER EXTREMITY ARTERIAL DUPLEX  Result Date: 02/07/2020 LOWER EXTREMITY ARTERIAL DUPLEX STUDY  Current ABI: Noncompressible pedal arteries bilaterally. 02/06/2020 Comparison Study: No prior study Performing Technologist: Maudry Mayhew MHA, RDMS, RVT, RDCS  Examination Guidelines: A complete evaluation includes B-mode imaging, spectral Doppler, color Doppler, and power Doppler as needed of all accessible portions of each vessel. Bilateral testing is considered an integral part of a complete examination. Limited examinations for reoccurring indications may be performed as noted.  +-----------+--------+-----+--------+----------+--------+  RIGHT       PSV cm/s Ratio Stenosis Waveform   Comments  +-----------+--------+-----+--------+----------+--------+  CFA Distal  74                      triphasic            +-----------+--------+-----+--------+----------+--------+  DFA          52                      biphasic             +-----------+--------+-----+--------+----------+--------+  SFA Prox    65  triphasic            +-----------+--------+-----+--------+----------+--------+  SFA Mid     68                      triphasic            +-----------+--------+-----+--------+----------+--------+  SFA Distal  70                      triphasic            +-----------+--------+-----+--------+----------+--------+  POP Distal  66                      triphasic            +-----------+--------+-----+--------+----------+--------+  TP Trunk    82                      triphasic            +-----------+--------+-----+--------+----------+--------+  ATA Distal  64                      monophasic           +-----------+--------+-----+--------+----------+--------+  PTA Distal  51                      monophasic           +-----------+--------+-----+--------+----------+--------+  PERO Distal 45                      monophasic           +-----------+--------+-----+--------+----------+--------+  +-----------+--------+-----+--------+----------+--------+  LEFT        PSV cm/s Ratio Stenosis Waveform   Comments  +-----------+--------+-----+--------+----------+--------+  CFA Distal  69                      triphasic            +-----------+--------+-----+--------+----------+--------+  DFA         54                      triphasic            +-----------+--------+-----+--------+----------+--------+  SFA Prox    71                      triphasic            +-----------+--------+-----+--------+----------+--------+  SFA Mid     63                      triphasic            +-----------+--------+-----+--------+----------+--------+  SFA Distal  64                      triphasic            +-----------+--------+-----+--------+----------+--------+  POP Distal  68                      triphasic            +-----------+--------+-----+--------+----------+--------+  TP Trunk    69                       monophasic           +-----------+--------+-----+--------+----------+--------+  ATA Distal  64  triphasic            +-----------+--------+-----+--------+----------+--------+  PTA Prox    26                      monophasic           +-----------+--------+-----+--------+----------+--------+  PTA Mid     20                      monophasic           +-----------+--------+-----+--------+----------+--------+  PTA Distal                          absent               +-----------+--------+-----+--------+----------+--------+  PERO Distal 62                      triphasic            +-----------+--------+-----+--------+----------+--------+  Summary: Bilateral: No evidence of hemodynamically significant stenosis involving the visualized arteries of bilateral lower extremities. Waveforms are diminished distally in bilateral lower extremities.  See table(s) above for measurements and observations. Electronically signed by Deitra Mayo MD on 02/07/2020 at 7:24:40 PM.    Final    VAS [place] LOWER EXTREMITY VENOUS (DVT)  Result Date: 02/06/2020  Lower Venous DVTStudy Indications: Edema.  Comparison Study: none Performing Technologist: June Leap RDMS, RVT  Examination Guidelines: A complete evaluation includes B-mode imaging, spectral Doppler, color Doppler, and power Doppler as needed of all accessible portions of each vessel. Bilateral testing is considered an integral part of a complete examination. Limited examinations for reoccurring indications may be performed as noted. The reflux portion of the exam is performed with the patient in reverse Trendelenburg.  +---------+---------------+---------+-----------+----------+--------------+  RIGHT     Compressibility Phasicity Spontaneity Properties Thrombus Aging  +---------+---------------+---------+-----------+----------+--------------+  CFV       Full            Yes       Yes                                     +---------+---------------+---------+-----------+----------+--------------+  SFJ       Full                                                             +---------+---------------+---------+-----------+----------+--------------+  FV Prox   Full                                                             +---------+---------------+---------+-----------+----------+--------------+  FV Mid    Full                                                             +---------+---------------+---------+-----------+----------+--------------+  FV Distal Full                                                             +---------+---------------+---------+-----------+----------+--------------+  PFV       Full                                                             +---------+---------------+---------+-----------+----------+--------------+  POP       Full            Yes       Yes                                    +---------+---------------+---------+-----------+----------+--------------+  PTV       Full                                                             +---------+---------------+---------+-----------+----------+--------------+  PERO      Full                                                             +---------+---------------+---------+-----------+----------+--------------+   +---------+---------------+---------+-----------+----------+--------------+  LEFT      Compressibility Phasicity Spontaneity Properties Thrombus Aging  +---------+---------------+---------+-----------+----------+--------------+  CFV       Full            Yes       Yes                                    +---------+---------------+---------+-----------+----------+--------------+  SFJ       Full                                                             +---------+---------------+---------+-----------+----------+--------------+  FV Prox   Full                                                              +---------+---------------+---------+-----------+----------+--------------+  FV Mid    Full                                                             +---------+---------------+---------+-----------+----------+--------------+  FV Distal Full                                                             +---------+---------------+---------+-----------+----------+--------------+  PFV       Full                                                             +---------+---------------+---------+-----------+----------+--------------+  POP       Full            Yes       Yes                                    +---------+---------------+---------+-----------+----------+--------------+  PTV       Full                                                             +---------+---------------+---------+-----------+----------+--------------+  PERO      Full                                                             +---------+---------------+---------+-----------+----------+--------------+     Summary: RIGHT: - There is no evidence of deep vein thrombosis in the lower extremity.  - No cystic structure found in the popliteal fossa.  LEFT: - There is no evidence of deep vein thrombosis in the lower extremity.  - No cystic structure found in the popliteal fossa.  *See table(s) above for measurements and observations. Electronically signed by Servando Snare MD on 02/06/2020 at 5:29:10 PM.    Final    ECHOCARDIOGRAM LIMITED  Result Date: 02/08/2020    ECHOCARDIOGRAM LIMITED REPORT   Patient Name:   CORALEE EDBERG Date of Exam: 02/08/2020 Medical Rec #:  [id number]         Height:       69.0 in Accession #:    [id number]        Weight:       178.8 lb Date of Birth:  [birth year]        BSA:          1.970 m Patient Age:    37 years          BP:           150/87 mmHg Patient Gender: F                 HR:           84 bpm. Exam Location:  Inpatient Procedure: Limited Echo Indications:    Pericardial effusion  History:        Patient has prior  history of Echocardiogram  examinations, most                 recent 02/05/2020. Transplant Complications, Pericardial Disease,                 Arrythmias:Atrial Fibrillation; Risk Factors:Diabetes,                 Hypertension and Dyslipidemia. Pericardial effusion, pleural                 effusion, Kidney transplant.  Sonographer:    Dustin Flock Referring Phys: [id number] [place]  1. Left ventricular ejection fraction, by estimation, is 70 to 75%. The left ventricle has hyperdynamic function. There is severe concentric left ventricular hypertrophy.  2. Right ventricular systolic function is normal. The right ventricular size is normal.  3. The pericardial effusion is moderate - large in sice . It is primarily located posteriorly. There is no evidence of pericardial tamponade. . Large pericardial effusion. There is no evidence of cardiac tamponade. FINDINGS  Left Ventricle: Left ventricular ejection fraction, by estimation, is 70 to 75%. The left ventricle has hyperdynamic function. There is severe concentric left ventricular hypertrophy. Right Ventricle: The right ventricular size is normal. No increase in right ventricular wall thickness. Right ventricular systolic function is normal. Pericardium: The pericardial effusion is moderate - large in sice . It is primarily located posteriorly. There is no evidence of pericardial tamponade. A large pericardial effusion is present. There is no evidence of cardiac tamponade. Mertie Moores MD Electronically signed by Mertie Moores MD Signature Date/Time: 02/08/2020/1:53:18 PM    Final     Microbiology: Recent Results (from the past 240 hour(s))  Culture, blood (Routine X 2) w Reflex to ID Panel     Status: None   Collection Time: 02/04/20  1:29 PM   Specimen: BLOOD  Result Value Ref Range Status   Specimen Description BLOOD BLOOD LEFT HAND  Final   Special Requests   Final    BOTTLES DRAWN AEROBIC AND ANAEROBIC Blood Culture results may not be  optimal due to an inadequate volume of blood received in culture bottles   Culture   Final    NO GROWTH 5 DAYS Performed at [facility], [place] [address] [place], [place] [address]    Report Status 02/09/2020 FINAL  Final  Urine culture     Status: Abnormal   Collection Time: 02/04/20  1:54 PM   Specimen: Urine, Random  Result Value Ref Range Status   Specimen Description URINE, RANDOM  Final   Special Requests   Final    NONE Performed at [facility], [place] [address] [place], [place] [address]    Culture (A)  Final    >=100,000 COLONIES/mL KLEBSIELLA OXYTOCA 40,000 COLONIES/mL ENTEROCOCCUS FAECIUM    Report Status 02/07/2020 FINAL  Final   Organism ID, Bacteria KLEBSIELLA OXYTOCA (A)  Final   Organism ID, Bacteria ENTEROCOCCUS FAECIUM (A)  Final      Susceptibility   Enterococcus faecium - MIC*    AMPICILLIN 16 RESISTANT Resistant     NITROFURANTOIN 64 INTERMEDIATE Intermediate     VANCOMYCIN 1 SENSITIVE Sensitive     GENTAMICIN SYNERGY SENSITIVE Sensitive     * 40,000 COLONIES/mL ENTEROCOCCUS FAECIUM   Klebsiella oxytoca - MIC*    AMPICILLIN RESISTANT Resistant     CEFAZOLIN <=4 SENSITIVE Sensitive     CEFTRIAXONE <=1 SENSITIVE Sensitive     CIPROFLOXACIN <=0.25 SENSITIVE Sensitive     GENTAMICIN <=  1 SENSITIVE Sensitive     IMIPENEM <=0.25 SENSITIVE Sensitive     NITROFURANTOIN 64 INTERMEDIATE Intermediate     TRIMETH/SULFA <=20 SENSITIVE Sensitive     AMPICILLIN/SULBACTAM 4 SENSITIVE Sensitive     PIP/TAZO <=4 SENSITIVE Sensitive     * >=100,000 COLONIES/mL KLEBSIELLA OXYTOCA  Respiratory Panel by RT PCR (Flu A&B, Covid) - Nasopharyngeal Swab     Status: None   Collection Time: 02/04/20  6:51 PM   Specimen: Nasopharyngeal Swab  Result Value Ref Range Status   SARS Coronavirus 2 by RT PCR NEGATIVE NEGATIVE Final    Comment: (NOTE) SARS-CoV-2 target nucleic acids are NOT DETECTED. The SARS-CoV-2 RNA is generally detectable in upper respiratoy specimens  during the acute phase of infection. The lowest concentration of SARS-CoV-2 viral copies this assay can detect is 131 copies/mL. A negative result does not preclude SARS-Cov-2 infection and should not be used as the sole basis for treatment or other patient management decisions. A negative result may occur with  improper specimen collection/handling, submission of specimen other than nasopharyngeal swab, presence of viral mutation(s) within the areas targeted by this assay, and inadequate number of viral copies (<131 copies/mL). A negative result must be combined with clinical observations, patient history, and epidemiological information. The expected result is Negative. Fact Sheet for Patients:  PinkCheek.be Fact Sheet for Healthcare Providers:  GravelBags.it This test is not yet ap proved or cleared by the [place] FDA and  has been authorized for detection and/or diagnosis of SARS-CoV-2 by FDA under an Emergency Use Authorization (EUA). This EUA will remain  in effect (meaning this test can be used) for the duration of the COVID-19 declaration under Section 564(b)(1) of the Act, 21 U.S.C. section 360bbb-3(b)(1), unless the authorization is terminated or revoked sooner.    Influenza A by PCR NEGATIVE NEGATIVE Final   Influenza B by PCR NEGATIVE NEGATIVE Final    Comment: (NOTE) The Xpert Xpress SARS-CoV-2/FLU/RSV assay is intended as an aid in  the diagnosis of influenza from Nasopharyngeal swab specimens and  should not be used as a sole basis for treatment. Nasal washings and  aspirates are unacceptable for Xpert Xpress SARS-CoV-2/FLU/RSV  testing. Fact Sheet for Patients: PinkCheek.be Fact Sheet for Healthcare Providers: GravelBags.it This test is not yet approved or cleared by the [place] FDA and  has been authorized for detection and/or diagnosis of  SARS-CoV-2 by  FDA under an Emergency Use Authorization (EUA). This EUA will remain  in effect (meaning this test can be used) for the duration of the  Covid-19 declaration under Section 564(b)(1) of the Act, 21  U.S.C. section 360bbb-3(b)(1), unless the authorization is  terminated or revoked. Performed at [facility], [place] [address] [place], [place] [address]   Culture, blood (Routine X 2) w Reflex to ID Panel     Status: None   Collection Time: 02/04/20 11:50 PM   Specimen: BLOOD RIGHT HAND  Result Value Ref Range Status   Specimen Description BLOOD RIGHT HAND  Final   Special Requests   Final    BOTTLES DRAWN AEROBIC ONLY Blood Culture results may not be optimal due to an inadequate volume of blood received in culture bottles   Culture   Final    NO GROWTH 5 DAYS Performed at [facility], [place] [address] [place], [place] [address]    Report Status 02/10/2020 FINAL  Final     Labs: CBC: Recent Labs  Lab 02/06/20 0443 02/07/20 [phone]  02/09/20 1039 02/10/20 1202 02/11/20 0629  WBC 3.4* 3.7* 4.9 5.2 4.7  NEUTROABS  --  3.5  --   --   --   HGB 9.5* 10.1* 11.8* 11.5* 11.4*  HCT 30.8* 32.2* 37.1 36.5 36.2  MCV 106.6* 104.2* 101.6* 103.7* 103.1*  PLT 235 292 330 268 035   Basic Metabolic Panel: Recent Labs  Lab 02/07/20 0635 02/08/20 0621 02/09/20 1039 02/10/20 1202 02/11/20 0629  NA 140 140 141 141 143  K 3.7 3.5 4.1 4.6 3.6  CL 113* 111 109 112* 112*  CO2 20* 20* 19* 18* 20*  GLUCOSE 232* 226* 177* 172* 123*  BUN 37* 40* 44* 44* 44*  CREATININE 2.95* 3.20* 3.15* 3.10* 2.94*  CALCIUM 7.5* 7.8* 7.8* 7.6* 7.7*  PHOS 3.9 4.3 4.3   4.3 4.5 4.8*   Liver Function Tests: Recent Labs  Lab 02/07/20 0635 02/08/20 0621 02/09/20 1039 02/10/20 1202 02/11/20 0629  ALBUMIN 1.5* 1.5* 1.8* 1.8* 1.6*   No results for input(s): LIPASE, AMYLASE in the last 168 hours. No results for input(s): AMMONIA in the last 168 hours. Cardiac Enzymes: No results for  input(s): CKTOTAL, CKMB, CKMBINDEX, TROPONINI in the last 168 hours. BNP (last 3 results) Recent Labs    02/04/20 2345  BNP 373.6*   CBG: Recent Labs  Lab 02/10/20 0610 02/10/20 1110 02/10/20 1813 02/10/20 2122 02/11/20 0615  GLUCAP 157* 144* 205* 135* 110*    Time spent: 35 minutes  Signed:  Berle Mull  [facility] 02/11/2020 10:55 AM

## 2020-03-10 ENCOUNTER — Encounter (HOSPITAL_BASED_OUTPATIENT_CLINIC_OR_DEPARTMENT_OTHER): Payer: Medicare HMO | Admitting: Internal Medicine

## 2020-03-11 ENCOUNTER — Other Ambulatory Visit: Payer: Self-pay | Admitting: Physical Medicine & Rehabilitation

## 2020-03-20 ENCOUNTER — Other Ambulatory Visit: Payer: Self-pay | Admitting: Physical Medicine & Rehabilitation

## 2020-04-26 ENCOUNTER — Other Ambulatory Visit: Payer: Self-pay | Admitting: Physical Medicine & Rehabilitation

## 2020-05-01 ENCOUNTER — Inpatient Hospital Stay (HOSPITAL_COMMUNITY): Payer: Medicare HMO

## 2020-05-01 ENCOUNTER — Emergency Department (HOSPITAL_COMMUNITY): Payer: Medicare HMO

## 2020-05-01 ENCOUNTER — Inpatient Hospital Stay (HOSPITAL_COMMUNITY)
Admission: EM | Admit: 2020-05-01 | Discharge: 2020-06-01 | DRG: 853 | Disposition: A | Discharge status: Skilled Nursing Facility | Payer: Medicare HMO | Attending: Internal Medicine | Admitting: Internal Medicine

## 2020-05-01 DIAGNOSIS — I361 Nonrheumatic tricuspid (valve) insufficiency: Secondary | ICD-10-CM | POA: Diagnosis not present

## 2020-05-01 DIAGNOSIS — L03115 Cellulitis of right lower limb: Secondary | ICD-10-CM | POA: Diagnosis present

## 2020-05-01 DIAGNOSIS — R579 Shock, unspecified: Secondary | ICD-10-CM

## 2020-05-01 DIAGNOSIS — D631 Anemia in chronic kidney disease: Secondary | ICD-10-CM | POA: Diagnosis present

## 2020-05-01 DIAGNOSIS — E1152 Type 2 diabetes mellitus with diabetic peripheral angiopathy with gangrene: Secondary | ICD-10-CM | POA: Diagnosis present

## 2020-05-01 DIAGNOSIS — E872 Acidosis: Secondary | ICD-10-CM | POA: Diagnosis present

## 2020-05-01 DIAGNOSIS — R64 Cachexia: Secondary | ICD-10-CM | POA: Diagnosis present

## 2020-05-01 DIAGNOSIS — D84821 Immunodeficiency due to drugs: Secondary | ICD-10-CM | POA: Diagnosis present

## 2020-05-01 DIAGNOSIS — Z87440 Personal history of urinary (tract) infections: Secondary | ICD-10-CM

## 2020-05-01 DIAGNOSIS — R6251 Failure to thrive (child): Secondary | ICD-10-CM | POA: Diagnosis not present

## 2020-05-01 DIAGNOSIS — Z940 Kidney transplant status: Secondary | ICD-10-CM | POA: Diagnosis not present

## 2020-05-01 DIAGNOSIS — I1310 Hypertensive heart and chronic kidney disease without heart failure, with stage 1 through stage 4 chronic kidney disease, or unspecified chronic kidney disease: Secondary | ICD-10-CM | POA: Diagnosis present

## 2020-05-01 DIAGNOSIS — A4159 Other Gram-negative sepsis: Principal | ICD-10-CM | POA: Diagnosis present

## 2020-05-01 DIAGNOSIS — L89620 Pressure ulcer of left heel, unstageable: Secondary | ICD-10-CM | POA: Diagnosis present

## 2020-05-01 DIAGNOSIS — F329 Major depressive disorder, single episode, unspecified: Secondary | ICD-10-CM | POA: Diagnosis present

## 2020-05-01 DIAGNOSIS — E1122 Type 2 diabetes mellitus with diabetic chronic kidney disease: Secondary | ICD-10-CM | POA: Diagnosis present

## 2020-05-01 DIAGNOSIS — E11621 Type 2 diabetes mellitus with foot ulcer: Secondary | ICD-10-CM | POA: Diagnosis present

## 2020-05-01 DIAGNOSIS — N184 Chronic kidney disease, stage 4 (severe): Secondary | ICD-10-CM | POA: Diagnosis present

## 2020-05-01 DIAGNOSIS — Z961 Presence of intraocular lens: Secondary | ICD-10-CM | POA: Diagnosis present

## 2020-05-01 DIAGNOSIS — R6521 Severe sepsis with septic shock: Secondary | ICD-10-CM | POA: Diagnosis present

## 2020-05-01 DIAGNOSIS — Z885 Allergy status to narcotic agent status: Secondary | ICD-10-CM

## 2020-05-01 DIAGNOSIS — R7881 Bacteremia: Secondary | ICD-10-CM | POA: Diagnosis not present

## 2020-05-01 DIAGNOSIS — K219 Gastro-esophageal reflux disease without esophagitis: Secondary | ICD-10-CM | POA: Diagnosis present

## 2020-05-01 DIAGNOSIS — Z515 Encounter for palliative care: Secondary | ICD-10-CM | POA: Diagnosis not present

## 2020-05-01 DIAGNOSIS — D62 Acute posthemorrhagic anemia: Secondary | ICD-10-CM | POA: Diagnosis not present

## 2020-05-01 DIAGNOSIS — F320 Major depressive disorder, single episode, mild: Secondary | ICD-10-CM | POA: Diagnosis not present

## 2020-05-01 DIAGNOSIS — A419 Sepsis, unspecified organism: Secondary | ICD-10-CM

## 2020-05-01 DIAGNOSIS — I10 Essential (primary) hypertension: Secondary | ICD-10-CM | POA: Diagnosis not present

## 2020-05-01 DIAGNOSIS — F32A Depression, unspecified: Secondary | ICD-10-CM | POA: Diagnosis present

## 2020-05-01 DIAGNOSIS — E1121 Type 2 diabetes mellitus with diabetic nephropathy: Secondary | ICD-10-CM | POA: Diagnosis not present

## 2020-05-01 DIAGNOSIS — Z20822 Contact with and (suspected) exposure to covid-19: Secondary | ICD-10-CM | POA: Diagnosis present

## 2020-05-01 DIAGNOSIS — E1142 Type 2 diabetes mellitus with diabetic polyneuropathy: Secondary | ICD-10-CM | POA: Diagnosis present

## 2020-05-01 DIAGNOSIS — R627 Adult failure to thrive: Secondary | ICD-10-CM | POA: Diagnosis present

## 2020-05-01 DIAGNOSIS — Z9049 Acquired absence of other specified parts of digestive tract: Secondary | ICD-10-CM

## 2020-05-01 DIAGNOSIS — E875 Hyperkalemia: Secondary | ICD-10-CM | POA: Diagnosis not present

## 2020-05-01 DIAGNOSIS — Z800 Family history of malignant neoplasm of digestive organs: Secondary | ICD-10-CM

## 2020-05-01 DIAGNOSIS — T148XXA Other injury of unspecified body region, initial encounter: Secondary | ICD-10-CM | POA: Diagnosis not present

## 2020-05-01 DIAGNOSIS — I480 Paroxysmal atrial fibrillation: Secondary | ICD-10-CM | POA: Diagnosis present

## 2020-05-01 DIAGNOSIS — L089 Local infection of the skin and subcutaneous tissue, unspecified: Secondary | ICD-10-CM | POA: Diagnosis not present

## 2020-05-01 DIAGNOSIS — T8610 Unspecified complication of kidney transplant: Secondary | ICD-10-CM | POA: Diagnosis present

## 2020-05-01 DIAGNOSIS — E1129 Type 2 diabetes mellitus with other diabetic kidney complication: Secondary | ICD-10-CM | POA: Diagnosis present

## 2020-05-01 DIAGNOSIS — B961 Klebsiella pneumoniae [K. pneumoniae] as the cause of diseases classified elsewhere: Secondary | ICD-10-CM

## 2020-05-01 DIAGNOSIS — E43 Unspecified severe protein-calorie malnutrition: Secondary | ICD-10-CM | POA: Diagnosis present

## 2020-05-01 DIAGNOSIS — D72829 Elevated white blood cell count, unspecified: Secondary | ICD-10-CM | POA: Diagnosis not present

## 2020-05-01 DIAGNOSIS — M79673 Pain in unspecified foot: Secondary | ICD-10-CM | POA: Diagnosis not present

## 2020-05-01 DIAGNOSIS — R509 Fever, unspecified: Secondary | ICD-10-CM

## 2020-05-01 DIAGNOSIS — Z9841 Cataract extraction status, right eye: Secondary | ICD-10-CM

## 2020-05-01 DIAGNOSIS — I959 Hypotension, unspecified: Secondary | ICD-10-CM | POA: Diagnosis not present

## 2020-05-01 DIAGNOSIS — B9789 Other viral agents as the cause of diseases classified elsewhere: Secondary | ICD-10-CM | POA: Diagnosis present

## 2020-05-01 DIAGNOSIS — E039 Hypothyroidism, unspecified: Secondary | ICD-10-CM | POA: Diagnosis present

## 2020-05-01 DIAGNOSIS — R1310 Dysphagia, unspecified: Secondary | ICD-10-CM | POA: Diagnosis present

## 2020-05-01 DIAGNOSIS — Z7189 Other specified counseling: Secondary | ICD-10-CM

## 2020-05-01 DIAGNOSIS — Z833 Family history of diabetes mellitus: Secondary | ICD-10-CM

## 2020-05-01 DIAGNOSIS — I313 Pericardial effusion (noninflammatory): Secondary | ICD-10-CM | POA: Diagnosis not present

## 2020-05-01 DIAGNOSIS — Z888 Allergy status to other drugs, medicaments and biological substances status: Secondary | ICD-10-CM

## 2020-05-01 DIAGNOSIS — Z7901 Long term (current) use of anticoagulants: Secondary | ICD-10-CM

## 2020-05-01 DIAGNOSIS — L8990 Pressure ulcer of unspecified site, unspecified stage: Secondary | ICD-10-CM | POA: Insufficient documentation

## 2020-05-01 DIAGNOSIS — Z6822 Body mass index (BMI) 22.0-22.9, adult: Secondary | ICD-10-CM

## 2020-05-01 DIAGNOSIS — Z9884 Bariatric surgery status: Secondary | ICD-10-CM

## 2020-05-01 DIAGNOSIS — L97519 Non-pressure chronic ulcer of other part of right foot with unspecified severity: Secondary | ICD-10-CM | POA: Diagnosis present

## 2020-05-01 DIAGNOSIS — I330 Acute and subacute infective endocarditis: Secondary | ICD-10-CM | POA: Diagnosis present

## 2020-05-01 DIAGNOSIS — Z7401 Bed confinement status: Secondary | ICD-10-CM

## 2020-05-01 DIAGNOSIS — E785 Hyperlipidemia, unspecified: Secondary | ICD-10-CM | POA: Diagnosis present

## 2020-05-01 DIAGNOSIS — Z9842 Cataract extraction status, left eye: Secondary | ICD-10-CM

## 2020-05-01 LAB — URINALYSIS, ROUTINE W REFLEX MICROSCOPIC
Bilirubin Urine: NEGATIVE
Glucose, UA: NEGATIVE mg/dL
Ketones, ur: NEGATIVE mg/dL
Nitrite: NEGATIVE
Protein, ur: 100 mg/dL — AB
RBC / HPF: >50 RBC/hpf — ABNORMAL HIGH (ref 0–5)
Specific Gravity, Urine: 1.012 (ref 1.005–1.030)
WBC, UA: >50 WBC/hpf — ABNORMAL HIGH (ref 0–5)
pH: 5 (ref 5.0–8.0)

## 2020-05-01 LAB — CBC
HCT: 34.3 % — ABNORMAL LOW (ref 36.0–46.0)
Hemoglobin: 10.4 g/dL — ABNORMAL LOW (ref 12.0–15.0)
MCH: 31.8 pg (ref 26.0–34.0)
MCHC: 30.3 g/dL (ref 30.0–36.0)
MCV: 104.9 fL — ABNORMAL HIGH (ref 80.0–100.0)
Platelets: 352 10*3/uL (ref 150–400)
RBC: 3.27 MIL/uL — ABNORMAL LOW (ref 3.87–5.11)
RDW: 15.8 % — ABNORMAL HIGH (ref 11.5–15.5)
WBC: 22.9 10*3/uL — ABNORMAL HIGH (ref 4.0–10.5)
nRBC: 0 % (ref 0.0–0.2)

## 2020-05-01 LAB — COMPREHENSIVE METABOLIC PANEL
ALT: 9 U/L (ref 0–44)
AST: 18 U/L (ref 15–41)
Albumin: 1.5 g/dL — ABNORMAL LOW (ref 3.5–5.0)
Alkaline Phosphatase: 287 U/L — ABNORMAL HIGH (ref 38–126)
Anion gap: 9 (ref 5–15)
BUN: 51 mg/dL — ABNORMAL HIGH (ref 6–20)
CO2: 16 mmol/L — ABNORMAL LOW (ref 22–32)
Calcium: 8.3 mg/dL — ABNORMAL LOW (ref 8.9–10.3)
Chloride: 111 mmol/L (ref 98–111)
Creatinine, Ser: 3.09 mg/dL — ABNORMAL HIGH (ref 0.44–1.00)
GFR calc Af Amer: 18 mL/min — ABNORMAL LOW (ref >60)
GFR calc non Af Amer: 16 mL/min — ABNORMAL LOW (ref >60)
Glucose, Bld: 85 mg/dL (ref 70–99)
Potassium: 4.9 mmol/L (ref 3.5–5.1)
Sodium: 136 mmol/L (ref 135–145)
Total Bilirubin: 0.8 mg/dL (ref 0.3–1.2)
Total Protein: 5.2 g/dL — ABNORMAL LOW (ref 6.5–8.1)

## 2020-05-01 LAB — PROTIME-INR
INR: 2.7 — ABNORMAL HIGH (ref 0.8–1.2)
Prothrombin Time: 27.7 seconds — ABNORMAL HIGH (ref 11.4–15.2)

## 2020-05-01 LAB — CBC WITH DIFFERENTIAL/PLATELET
Abs Immature Granulocytes: 0 10*3/uL (ref 0.00–0.07)
Basophils Absolute: 0 10*3/uL (ref 0.0–0.1)
Basophils Relative: 0 %
Eosinophils Absolute: 0 10*3/uL (ref 0.0–0.5)
Eosinophils Relative: 0 %
HCT: 27.4 % — ABNORMAL LOW (ref 36.0–46.0)
Hemoglobin: 8.5 g/dL — ABNORMAL LOW (ref 12.0–15.0)
Lymphocytes Relative: 5 %
Lymphs Abs: 1.2 10*3/uL (ref 0.7–4.0)
MCH: 32.2 pg (ref 26.0–34.0)
MCHC: 31 g/dL (ref 30.0–36.0)
MCV: 103.8 fL — ABNORMAL HIGH (ref 80.0–100.0)
Monocytes Absolute: 1 10*3/uL (ref 0.1–1.0)
Monocytes Relative: 4 %
Neutro Abs: 21.9 10*3/uL — ABNORMAL HIGH (ref 1.7–7.7)
Neutrophils Relative %: 91 %
Platelets: 337 10*3/uL (ref 150–400)
RBC: 2.64 MIL/uL — ABNORMAL LOW (ref 3.87–5.11)
RDW: 15.9 % — ABNORMAL HIGH (ref 11.5–15.5)
WBC: 24.1 10*3/uL — ABNORMAL HIGH (ref 4.0–10.5)
nRBC: 0 % (ref 0.0–0.2)

## 2020-05-01 LAB — LACTIC ACID, PLASMA: Lactic Acid, Venous: 1 mmol/L (ref 0.5–1.9)

## 2020-05-01 LAB — SARS CORONAVIRUS 2 BY RT PCR (HOSPITAL ORDER, PERFORMED IN ~~LOC~~ HOSPITAL LAB): SARS Coronavirus 2: NEGATIVE

## 2020-05-01 LAB — APTT: aPTT: 51 seconds — ABNORMAL HIGH (ref 24–36)

## 2020-05-01 LAB — CBG MONITORING, ED
Glucose-Capillary: 85 mg/dL (ref 70–99)
Glucose-Capillary: 98 mg/dL (ref 70–99)

## 2020-05-01 LAB — SEDIMENTATION RATE: Sed Rate: 72 mm/hr — ABNORMAL HIGH (ref 0–22)

## 2020-05-01 MED ORDER — VANCOMYCIN HCL IN DEXTROSE 1-5 GM/200ML-% IV SOLN: 1000.0000 mg | Freq: Once | INTRAVENOUS | Status: DC

## 2020-05-01 MED ORDER — SODIUM CHLORIDE 0.9 % IV SOLN
2.0000 g | INTRAVENOUS | Status: DC
  Administered 2020-05-02 – 2020-05-04 (×3): 2 g via INTRAVENOUS
  Filled 2020-05-01 (×2): qty 20
  Filled 2020-05-01: qty 2
  Filled 2020-05-01: qty 20

## 2020-05-01 MED ORDER — LACTATED RINGERS IV BOLUS (SEPSIS)
1000.0000 mL | Freq: Once | INTRAVENOUS | Status: AC
  Administered 2020-05-01: 1000 mL via INTRAVENOUS

## 2020-05-01 MED ORDER — DICYCLOMINE HCL 10 MG PO CAPS: 10.0000 mg | ORAL_CAPSULE | Freq: Three times a day (TID) | ORAL | Status: DC | PRN

## 2020-05-01 MED ORDER — ATORVASTATIN CALCIUM 10 MG PO TABS: 20.0000 mg | ORAL_TABLET | Freq: Every day | ORAL | Status: DC

## 2020-05-01 MED ORDER — FAMOTIDINE 20 MG PO TABS: 20.0000 mg | ORAL_TABLET | Freq: Every day | ORAL | Status: DC | PRN

## 2020-05-01 MED ORDER — PANTOPRAZOLE SODIUM 40 MG PO TBEC
40.0000 mg | DELAYED_RELEASE_TABLET | Freq: Every day | ORAL | Status: DC
  Administered 2020-05-02 – 2020-06-01 (×30): 40 mg via ORAL
  Filled 2020-05-01 (×31): qty 1

## 2020-05-01 MED ORDER — FENTANYL CITRATE (PF) 100 MCG/2ML IJ SOLN
12.5000 ug | INTRAMUSCULAR | Status: DC | PRN
  Administered 2020-05-01 – 2020-05-14 (×8): 50 ug via INTRAVENOUS
  Filled 2020-05-01 (×8): qty 2

## 2020-05-01 MED ORDER — INSULIN ASPART 100 UNIT/ML ~~LOC~~ SOLN
0.0000 [IU] | Freq: Three times a day (TID) | SUBCUTANEOUS | Status: DC
  Administered 2020-05-02 – 2020-05-06 (×6): 1 [IU] via SUBCUTANEOUS

## 2020-05-01 MED ORDER — PREDNISONE 10 MG PO TABS
5.0000 mg | ORAL_TABLET | Freq: Every day | ORAL | Status: DC
  Administered 2020-05-02 – 2020-05-04 (×3): 5 mg via ORAL
  Filled 2020-05-01 (×4): qty 1

## 2020-05-01 MED ORDER — FOLIC ACID 1 MG PO TABS
1.0000 mg | ORAL_TABLET | Freq: Every day | ORAL | Status: DC
  Administered 2020-05-02 – 2020-06-01 (×31): 1 mg via ORAL
  Filled 2020-05-01 (×31): qty 1

## 2020-05-01 MED ORDER — LACTATED RINGERS IV BOLUS (SEPSIS)
500.0000 mL | Freq: Once | INTRAVENOUS | Status: AC
  Administered 2020-05-01: 500 mL via INTRAVENOUS

## 2020-05-01 MED ORDER — INSULIN ASPART 100 UNIT/ML ~~LOC~~ SOLN
0.0000 [IU] | Freq: Every day | SUBCUTANEOUS | Status: DC
  Administered 2020-05-06: 2 [IU] via SUBCUTANEOUS

## 2020-05-01 MED ORDER — HEPARIN SODIUM (PORCINE) 5000 UNIT/ML IJ SOLN
5000.0000 [IU] | Freq: Three times a day (TID) | INTRAMUSCULAR | Status: AC
  Administered 2020-05-01 – 2020-05-06 (×16): 5000 [IU] via SUBCUTANEOUS
  Filled 2020-05-01 (×15): qty 1

## 2020-05-01 MED ORDER — SENNOSIDES-DOCUSATE SODIUM 8.6-50 MG PO TABS
1.0000 | ORAL_TABLET | Freq: Every evening | ORAL | Status: DC | PRN
  Administered 2020-05-03: 1 via ORAL
  Filled 2020-05-01: qty 1

## 2020-05-01 MED ORDER — VITAMIN B-6 50 MG PO TABS: 50.0000 mg | ORAL_TABLET | Freq: Every day | ORAL | Status: DC

## 2020-05-01 MED ORDER — VALGANCICLOVIR HCL 450 MG PO TABS: 450.0000 mg | ORAL_TABLET | Freq: Every day | ORAL | Status: DC

## 2020-05-01 MED ORDER — TACROLIMUS 1 MG PO CAPS
3.0000 mg | ORAL_CAPSULE | Freq: Two times a day (BID) | ORAL | Status: DC
  Administered 2020-05-02: 3 mg via ORAL
  Filled 2020-05-01 (×2): qty 3

## 2020-05-01 MED ORDER — VANCOMYCIN HCL 750 MG/150ML IV SOLN
750.0000 mg | INTRAVENOUS | Status: DC
  Administered 2020-05-02 – 2020-05-04 (×3): 750 mg via INTRAVENOUS
  Filled 2020-05-01 (×4): qty 150

## 2020-05-01 MED ORDER — AMIODARONE HCL 200 MG PO TABS
200.0000 mg | ORAL_TABLET | Freq: Every day | ORAL | Status: DC
  Administered 2020-05-02 – 2020-06-01 (×31): 200 mg via ORAL
  Filled 2020-05-01 (×31): qty 1

## 2020-05-01 MED ORDER — SODIUM CHLORIDE 0.9 % IV SOLN
2.0000 g | Freq: Once | INTRAVENOUS | Status: AC
  Administered 2020-05-01: 2 g via INTRAVENOUS
  Filled 2020-05-01: qty 20

## 2020-05-01 MED ORDER — ACETAMINOPHEN 650 MG RE SUPP: 650.0000 mg | Freq: Four times a day (QID) | RECTAL | Status: DC | PRN

## 2020-05-01 MED ORDER — THIAMINE HCL 100 MG PO TABS
100.0000 mg | ORAL_TABLET | Freq: Every day | ORAL | Status: DC
  Administered 2020-05-02 – 2020-06-01 (×31): 100 mg via ORAL
  Filled 2020-05-01 (×31): qty 1

## 2020-05-01 MED ORDER — ONDANSETRON HCL 4 MG/2ML IJ SOLN
4.0000 mg | Freq: Four times a day (QID) | INTRAMUSCULAR | Status: DC | PRN
  Administered 2020-05-02 – 2020-05-30 (×14): 4 mg via INTRAVENOUS
  Filled 2020-05-01 (×14): qty 2

## 2020-05-01 MED ORDER — OXYCODONE HCL 5 MG PO TABS
5.0000 mg | ORAL_TABLET | ORAL | Status: DC | PRN
  Administered 2020-05-02 – 2020-06-01 (×52): 5 mg via ORAL
  Filled 2020-05-01 (×54): qty 1

## 2020-05-01 MED ORDER — PREGABALIN 25 MG PO CAPS: 25.0000 mg | ORAL_CAPSULE | Freq: Every day | ORAL | Status: DC

## 2020-05-01 MED ORDER — ONDANSETRON HCL 4 MG PO TABS
4.0000 mg | ORAL_TABLET | Freq: Four times a day (QID) | ORAL | Status: DC | PRN
  Administered 2020-05-25 – 2020-05-26 (×2): 4 mg via ORAL
  Filled 2020-05-01 (×2): qty 1

## 2020-05-01 MED ORDER — VANCOMYCIN HCL 1500 MG/300ML IV SOLN
1500.0000 mg | Freq: Once | INTRAVENOUS | Status: AC
  Administered 2020-05-01: 1500 mg via INTRAVENOUS
  Filled 2020-05-01: qty 300

## 2020-05-01 MED ORDER — NORTRIPTYLINE HCL 25 MG PO CAPS
25.0000 mg | ORAL_CAPSULE | Freq: Every day | ORAL | Status: DC
  Administered 2020-05-02 – 2020-05-31 (×31): 25 mg via ORAL
  Filled 2020-05-01 (×36): qty 1

## 2020-05-01 MED ORDER — LACTATED RINGERS IV SOLN: INTRAVENOUS | Status: AC

## 2020-05-01 MED ORDER — ACETAMINOPHEN 325 MG PO TABS
650.0000 mg | ORAL_TABLET | Freq: Four times a day (QID) | ORAL | Status: DC | PRN
  Administered 2020-05-03 – 2020-05-27 (×12): 650 mg via ORAL
  Filled 2020-05-01 (×12): qty 2
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
## 2020-05-01 NOTE — H&P (Signed)
History and Physical    Wanda [name] RCV:[id number] DOB: May 04, 1961 DOA: 05/01/2020  PCP: Lorene Dy, MD   Patient coming from: Home  I have personally briefly reviewed patient's old medical records in [place]  Chief Complaint: Right foot ulcer drainage and fever  HPI: Wanda [name] is a 59 y.o. female with medical history significant of diabetes type 2, hypertension, paroxysmal A. fib on Eliquis, hyperlipidemia, ESRD with failed kidney transplant in 2013 and repeat transplant in September 2020 at [facility] with current chronic renal disease stage IV, anemia of chronic disease, history of gastric sleeve surgery, hypoalbuminemia, right foot ulcer, chronic neuropathy presented with worsening right foot ulcer drainage getting worse over the last 2 weeks.  She also complains of worsening right lower extremity pain with foul-smelling drainage and difficulty ambulation.  Over the last 2 days, she has had fever with chills along with nausea but no vomiting.  Her appetite is very poor.  Denies dysuria, hematuria, diarrhea, increased abdominal pain, cough, shortness of breath, chest pain, palpitations, loss of consciousness.  Patient does not follow-up with wound care as an outpatient.  She has not received her Covid vaccines yet.  ED Course: Patient was found to be febrile with foul-smelling right foot ulcer with drainage along with leukocytosis.  Patient was started on IV fluids and antibiotics.  X-ray of the right foot did not show any evidence of osteomyelitis.  Hospitalist service was called to evaluate the patient.  Review of Systems: As per HPI otherwise all other systems were reviewed and are negative.   Past Medical History:  Diagnosis Date  . Anemia   . Arthritis    "hands" (07/24/2013) pt denies  . Chronic kidney disease, stage IV (severe) ([place])    "never went on dialysis" (07/24/2013)  . Diabetes mellitus type II   . Diabetic peripheral neuropathy ([place])   . Dialysis  patient [facility])    Monday Wednesday Friday   . Dyspnea on exertion    pt denies  . Foot pain   . GERD (gastroesophageal reflux disease)   . Gout   . History of chicken pox   . History of measles   . History of mumps   . Hyperlipidemia   . Hypothyroidism   . Migraines    "q 3 months" (07/24/2013)  . Nonspecific abnormal unspecified cardiovascular function study   . Obesity   . PAD (peripheral artery disease) ([place])   . Peripheral neuropathy   . Peripheral vascular disease ([place])   . Pneumonia 09/2012  . Renal transplant disorder   . Shingles   . Sleep apnea    "getting ready to get tested again cause dr says I have it" (07/24/2013) pt states was tested and was told she did not have sleep apnea / no cpap  . Unspecified essential hypertension     Past Surgical History:  Procedure Laterality Date  . AV FISTULA PLACEMENT Right 2000   "lower arm" (07/24/2013)  . AV FISTULA PLACEMENT, RADIOCEPHALIC Right 1025   "upper arm" (07/24/2013)  . CAPD REMOVAL N/A 10/10/2016   Procedure: CONTINUOUS AMBULATORY PERITONEAL DIALYSIS  (CAPD) CATHETER REMOVAL;  Surgeon: Donnie Mesa, MD;  Location: [place];  Service: General;  Laterality: N/A;  . CATARACT EXTRACTION W/ INTRAOCULAR LENS  IMPLANT, BILATERAL Bilateral 2011  . [place]; 1987  . CHOLECYSTECTOMY  2011  . COLONOSCOPY    . ENDOMETRIAL CRYOABLATION  1980's  . INTRAUTERINE DEVICE (IUD) INSERTION  2011  . LAPAROSCOPIC GASTRIC  SLEEVE RESECTION N/A 05/26/2015   Procedure: LAPAROSCOPIC GASTRIC SLEEVE RESECTION;  Surgeon: Excell Seltzer, MD;  Location: [facility];  Service: General;  Laterality: N/A;  . TRANSPLANTATION RENAL  2013   "right" (09/04/2012)  . TUBAL LIGATION  1987  . [place]   "RLE; stripped" (09/04/2012)  . VIDEO BRONCHOSCOPY  09/07/2012   Procedure: VIDEO BRONCHOSCOPY WITHOUT FLUORO;  Surgeon: Chesley Mires, MD;  Location: [facility];  Service: Cardiopulmonary;  Laterality: Bilateral;   Social  history  reports that she has never smoked. She has never used smokeless tobacco. She reports that she does not drink alcohol and does not use drugs.  Allergies  Allergen Reactions  . Hydrocodone-Acetaminophen Other (See Comments)    Severe headache  . Hydrocodone-Acetaminophen     Other reaction(s): headache, Other (See Comments), Unknown Severe headache   . Propoxyphene N-Acetaminophen Other (See Comments)    Severe headache  . Astemizole     Other reaction(s): headache, Other (See Comments), Unknown Rash   . Cephalosporins Rash    Other reaction(s): Other (See Comments), Unknown Rash     Family History  Problem Relation Age of Onset  . Colon cancer Father        deceased at age 13  . Diabetes Brother   . Neuropathy Neg Hx     Prior to Admission medications   Medication Sig Start Date End Date Taking? Authorizing Provider  acetaminophen (TYLENOL) 325 MG tablet Take 650 mg by mouth every 6 (six) hours as needed for mild pain.    [provider]  allopurinol (ZYLOPRIM) 100 MG tablet Take 2 tablets (200 mg total) by mouth daily. 12/02/17   Ashley Murrain, NP  Amino Acids-Protein Hydrolys (FEEDING SUPPLEMENT, PRO-STAT SUGAR FREE 64,) LIQD Take 30 mLs by mouth 2 (two) times daily. 02/11/20   Lavina Hamman, MD  amiodarone (PACERONE) 200 MG tablet Take 200 mg by mouth daily.    [provider]  apixaban (ELIQUIS) 5 MG TABS tablet Take 5 mg by mouth every 12 (twelve) hours.    [provider]  atorvastatin (LIPITOR) 20 MG tablet Take 20 mg by mouth at bedtime.    [provider]  calcitRIOL (ROCALTROL) 0.25 MCG capsule Take by mouth daily. 12/29/17   [provider]  Cholecalciferol (VITAMIN D-3) 25 MCG (1000 UT) CAPS Take 1,000 Units by mouth daily.    [provider]  colchicine (COLCRYS) 0.6 MG tablet Take 0.6 mg by mouth daily as needed (gout).  12/30/15   [provider]  dicyclomine (BENTYL) 10 MG capsule Take 10 mg  by mouth 3 (three) times daily as needed for spasms.    [provider]  famotidine (PEPCID) 20 MG tablet Take 20 mg by mouth daily as needed for heartburn. 06/30/18   [provider]  feeding supplement, GLUCERNA SHAKE, (GLUCERNA SHAKE) LIQD Take 237 mLs by mouth 3 (three) times daily between meals. 02/11/20   Lavina Hamman, MD  folic acid (FOLVITE) 1 MG tablet Take 1 mg by mouth daily.    [provider]  furosemide (LASIX) 40 MG tablet Take 1 tablet (40 mg total) by mouth daily as needed for fluid or edema (weight gain of 3lbs in 1 day or 5 lbs in 2 days). 02/11/20   Lavina Hamman, MD  hydrocortisone (ANUSOL-HC) 2.5 % rectal cream Place rectally 3 (three) times daily. 02/11/20   Lavina Hamman, MD  hydrOXYzine (ATARAX/VISTARIL) 10 MG tablet Take 10  mg by mouth 2 (two) times daily as needed for itching. 04/19/18   [provider]  L-Methylfolate-Algae-B12-B6 Glade [place]) 3-90.314-2-35 MG CAPS Take 1 tablet by mouth daily. 01/18/17   [provider]  loperamide (IMODIUM) 2 MG capsule Take 2 mg by mouth every 6 (six) hours as needed for diarrhea or loose stools.    [provider]  Multiple Vitamin (MULTIVITAMIN WITH MINERALS) TABS tablet Take 1 tablet by mouth daily.    [provider]  nortriptyline (PAMELOR) 25 MG capsule TAKE 1 CAPSULE (25 MG TOTAL) BY MOUTH AT BEDTIME. 04/01/19   Patel, Domenick Bookbinder, MD  ondansetron (ZOFRAN) 4 MG tablet Take 4 mg by mouth daily.    [provider]  pantoprazole (PROTONIX) 40 MG tablet Take 40 mg by mouth daily. 01/16/18   [provider]  polyethylene glycol (MIRALAX / GLYCOLAX) 17 g packet Take 17 g by mouth daily. 02/11/20   Lavina Hamman, MD  predniSONE (DELTASONE) 5 MG tablet Take 10 mg daily for 3 days and then resume 5 mg daily dose 02/11/20   Lavina Hamman, MD  pregabalin (LYRICA) 25 MG capsule Take 1 capsule (25 mg total) by mouth daily. 02/11/20   Lavina Hamman, MD  pyridOXINE  (B-6) 50 MG tablet Take 50 mg by mouth daily.    [provider]  ramelteon (ROZEREM) 8 MG tablet Take 8 mg by mouth at bedtime.    [provider]  sulfamethoxazole-trimethoprim (BACTRIM) 400-80 MG tablet Take 1 tablet by mouth 2 (two) times daily.    [provider]  tacrolimus (PROGRAF) 0.5 MG capsule Take 1.5 mg by mouth every 12 (twelve) hours.    [provider]  thiamine 100 MG tablet Take 100 mg by mouth daily.    [provider]  valGANciclovir (VALCYTE) 450 MG tablet Take 450 mg by mouth daily.    [provider]  Vitamin D, Ergocalciferol, (DRISDOL) 50000 units CAPS capsule TAKE 1 CAPSULE (50,000 UNITS TOTAL) BY MOUTH EVERY 7 (SEVEN) DAYS. 03/29/18   Jamse Arn, MD    Physical Exam: Vitals:   05/01/20 1438 05/01/20 1444 05/01/20 1554  BP: (!) 105/63  (!) 101/61  Pulse: (!) 110  102  Resp: 16  12  Temp: (!) 101 F (38.3 C)    TempSrc: Rectal    SpO2: 100%  100%  Weight:  78.5 kg   Height:  5\' 9"  (1.753 m)     Constitutional: No acute distress.  Looks chronically ill and older than stated age.  Looks cachectic.  Poor historian, does not communicate much. Vitals:   05/01/20 1438 05/01/20 1444 05/01/20 1554  BP: (!) 105/63  (!) 101/61  Pulse: (!) 110  102  Resp: 16  12  Temp: (!) 101 F (38.3 C)    TempSrc: Rectal    SpO2: 100%  100%  Weight:  78.5 kg   Height:  5\' 9"  (1.753 m)    Eyes: PERRL, lids and conjunctivae normal ENMT: Mucous membranes are dry. Posterior pharynx clear of any exudate or lesions. Neck: normal, supple, no masses, no thyromegaly Respiratory: bilateral decreased breath sounds at bases, no wheezing, no crackles. Normal respiratory effort. No accessory muscle use.  Cardiovascular: S1 S2 positive, intermittently tachycardic.  No extremity edema. 2+ pedal pulses.  Abdomen: no tenderness, no masses palpated. No hepatosplenomegaly. Bowel sounds positive.  Musculoskeletal: no clubbing /  cyanosis. No joint deformity upper and lower extremities.  Skin: Right foot dressing present.  I have reviewed the photo of the right heel ulcer on epic.  Right antecubital fossa fistula present Neurologic: CN 2-12 grossly intact. Moving extremities. No focal neurologic deficits.  Poor historian.   Psychiatric: Flat affect.  Does not participate in conversation much.   Labs on Admission: I have personally reviewed following labs and imaging studies  CBC: Recent Labs  Lab 05/01/20 1500  WBC 24.1*  NEUTROABS 21.9*  HGB 8.5*  HCT 27.4*  MCV 103.8*  PLT 161   Basic Metabolic Panel: Recent Labs  Lab 05/01/20 1500  NA 136  K 4.9  CL 111  CO2 16*  GLUCOSE 85  BUN 51*  CREATININE 3.09*  CALCIUM 8.3*   GFR: Estimated Creatinine Clearance: 20.5 mL/min (A) (by C-G formula based on SCr of 3.09 mg/dL (H)). Liver Function Tests: Recent Labs  Lab 05/01/20 1500  AST 18  ALT 9  ALKPHOS 287*  BILITOT 0.8  PROT 5.2*  ALBUMIN 1.5*   No results for input(s): LIPASE, AMYLASE in the last 168 hours. No results for input(s): AMMONIA in the last 168 hours. Coagulation Profile: Recent Labs  Lab 05/01/20 1500  INR 2.7*   Cardiac Enzymes: No results for input(s): CKTOTAL, CKMB, CKMBINDEX, TROPONINI in the last 168 hours. BNP (last 3 results) No results for input(s): PROBNP in the last 8760 hours. HbA1C: No results for input(s): HGBA1C in the last 72 hours. CBG: No results for input(s): GLUCAP in the last 168 hours. Lipid Profile: No results for input(s): CHOL, HDL, LDLCALC, TRIG, CHOLHDL, LDLDIRECT in the last 72 hours. Thyroid Function Tests: No results for input(s): TSH, T4TOTAL, FREET4, T3FREE, THYROIDAB in the last 72 hours. Anemia Panel: No results for input(s): VITAMINB12, FOLATE, FERRITIN, TIBC, IRON, RETICCTPCT in the last 72 hours. Urine analysis:    Component Value Date/Time   COLORURINE AMBER (A) 02/08/2020 1808   APPEARANCEUR CLOUDY (A) 02/08/2020 1808   LABSPEC  1.016 02/08/2020 1808   PHURINE 5.0 02/08/2020 1808   GLUCOSEU >=500 (A) 02/08/2020 1808   HGBUR MODERATE (A) 02/08/2020 1808   BILIRUBINUR NEGATIVE 02/08/2020 1808   KETONESUR NEGATIVE 02/08/2020 1808   PROTEINUR 30 (A) 02/08/2020 1808   UROBILINOGEN 0.2 12/22/2013 1950   NITRITE NEGATIVE 02/08/2020 1808   LEUKOCYTESUR MODERATE (A) 02/08/2020 1808    Radiological Exams on Admission: DG Tibia/Fibula Right  Result Date: 05/01/2020 CLINICAL DATA:  Pain.  Fever.  Possible infection. EXAM: RIGHT TIBIA AND FIBULA - 2 VIEW COMPARISON:  None. FINDINGS: Cortical margins of the tibia and fibula are intact. No periosteal reaction or bony destruction. There is no evidence of fracture or other focal bone lesions. Arterial vascular calcifications. Scattered venous phleboliths. Minimal skin thickening/soft tissue prominence about the medial aspect of the mid leg. No soft tissue air or radiopaque foreign body. IMPRESSION: 1. Minimal skin thickening/soft tissue prominence about the medial aspect of the mid leg is nonspecific. No soft tissue air or radiopaque foreign body. 2. No acute osseous abnormality. No radiographic evidence of osteomyelitis. Electronically Signed   By: Keith Rake M.D.   On: 05/01/2020 16:09   DG Chest Port 1 View  Result Date: 05/01/2020 CLINICAL DATA:  59 year old female with possible sepsis. EXAM: PORTABLE CHEST 1 VIEW COMPARISON:  Portable chest 02/04/2020 and earlier. FINDINGS: Portable AP semi upright view at 1446 hours. Stable mild elevation of the left hemidiaphragm. Lung volumes and mediastinal contours remain within normal limits. Continued indistinct left perihilar opacity since May. Allowing for portable technique the right lung remains clear. Negative  trachea. No pneumothorax or pleural effusion. Negative visible bowel gas pattern. No acute osseous abnormality identified. IMPRESSION: 1. Persistent vague left perihilar lung opacity since May. Recommend either PA and lateral  chest x-ray or follow-up Chest CT to further characterize. 2. No other cardiopulmonary abnormality. Electronically Signed   By: Genevie Ann M.D.   On: 05/01/2020 15:03   DG Foot Complete Left  Result Date: 05/01/2020 CLINICAL DATA:  Infection. Fever. Technologist notes state: Foot sores. EXAM: LEFT FOOT - COMPLETE 3+ VIEW COMPARISON:  None. FINDINGS: Mild hammertoe deformity of the digits. No fracture. No erosion, periosteal reaction, or evidence of bony destruction. Small Achilles tendon enthesophyte. Skin irregularity about the posterior heel. No radiopaque foreign body. There are vascular calcifications. IMPRESSION: 1. Skin irregularity about the posterior heel, represent soft tissue ulcer. No radiographic findings of osteomyelitis. 2. Mild hammertoe deformity of the digits. Electronically Signed   By: Keith Rake M.D.   On: 05/01/2020 16:08   DG Foot Complete Right  Result Date: 05/01/2020 CLINICAL DATA:  Infection. Fever. Technologist notes state: Foot sores. EXAM: RIGHT FOOT COMPLETE - 3+ VIEW COMPARISON:  Radiograph 02/05/2020.  Foot MRI 02/06/2020. FINDINGS: Mild hammertoe deformity of the digits. Mild hallux valgus. No fracture. No periosteal reaction, bony destruction, or abnormal density to suggest osteomyelitis. There is skin irregularity involving the posterior aspect of the heel. There is a faint curvilinear 11 mm density in the soft tissues that is seen only on the lateral view. Mild hindfoot osteoarthritis. There are vascular calcifications. IMPRESSION: 1. Soft tissue irregularity involving the posterior aspect of the heel, suspicious for ulcer. Faint curvilinear 11 mm density in the soft tissues may represent a foreign body. This is also seen on prior exam, however head no MRI correlate. 2. No radiographic findings of osteomyelitis. 3. Mild hammertoe deformity of the digits and hallux valgus. No acute osseous abnormality. Electronically Signed   By: Keith Rake M.D.   On: 05/01/2020  16:04    EKG: Independently reviewed.  Sinus tachycardia with no ST elevations or depressions.  Assessment/Plan  Sepsis: Present on admission Right foot ulcer infection with surrounding cellulitis, rule out osteomyelitis Leukocytosis -Patient presented with worsening right foot ulcer drainage over the last [redacted] weeks along with fever for the last couple of days.  Right foot x-ray does not show evidence of osteomyelitis. -Patient has been started on IV fluids and has received bolus of IV fluids in the ED.  Continue maintenance IV fluids.  Continue Rocephin and vancomycin.  Follow cultures. -MRI of the right foot without contrast. -Wound care consultation -ABI -Repeat a.m. labs  Chronic renal disease stage IV Metabolic acidosis History of ESRD status post renal transplant on chronic immunosuppressive medications -Baseline creatinine around 2.90-3.  Creatinine on presentation is 3.09. -Continue with IV fluids.  Monitor creatinine.  Strict input and output.  Avoid nephrotoxic medications. -Continue home dosing of prednisone along with tacrolimus and Valtrex.  Hold Bactrim until nephrology evaluation.  Spoke to Dr. Barbaraann Boys on phone who will see the patient in consultation as well.  Paroxysmal A. Fib -We will hold Eliquis in case patient undergoes surgical intervention. -Continue amiodarone.  Currently slightly tachycardic  Anemia of chronic disease -No signs of bleeding.  Monitor.  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  CBGs with SSI.  Carb modified diet.    DVT prophylaxis: Heparin Code Status: Full Family Communication: None at bedside Disposition Plan: Home in 2 to 3 days once  clinically improved  consults called: Nephrology Admission status: Progressive/inpatient  Severity of Illness: The appropriate patient status for this patient is INPATIENT. Inpatient status is judged to be  reasonable and necessary in order to provide the required intensity of service to ensure the patient's safety. The patient's presenting symptoms, physical exam findings, and initial radiographic and laboratory data in the context of their chronic comorbidities is felt to place them at high risk for further clinical deterioration. Furthermore, it is not anticipated that the patient will be medically stable for discharge from the hospital within 2 midnights of admission. The following factors support the patient status of inpatient.   " The patient's presenting symptoms include increasing right foot ulcer drainage along with fever. " The worrisome physical exam findings include right foot ulcer drainage, fever, hypotension. " The initial radiographic and laboratory data are worrisome because of leukocytosis, CKD stage IV. " The chronic co-morbidities include chronic kidney disease stage IV/history of renal transplant/chronic immunosuppression.   * I certify that at the point of admission it is my clinical judgment that the patient will require inpatient hospital care spanning beyond 2 midnights from the point of admission due to high intensity of service, high risk for further deterioration and high frequency of surveillance required.Aline August MD [facility]  05/01/2020, 5:06 PM

## 2020-05-01 NOTE — ED Provider Notes (Addendum)
MSE was initiated and I personally evaluated the patient and placed orders (if any) at  2:44 PM on May 01, 2020.  The patient appears stable so that the remainder of the MSE may be completed by another provider.   Patient brought in by EMS for evaluation of fever, increased lethargy for 2 days.  Patient is status post renal transplant on immunomodulating medications.  Previously on dialysis, not currently.  Also has a history of diabetes, peripheral arterial disease.  Admission in May for UTI and hypotension.  Reports that she is currently bedbound and has been since last September.  Has had progressively worsening wounds to the bilateral heels for the last several months, right worse than left.  For the last 2 days her husband has noticed worsening pain and abnormal drainage coming from the right foot wound per her report.  Denies chest pain, shortness of breath, abdominal pain, nausea, vomiting, or urinary symptoms.  Physical Exam Cardiovascular:     Rate and Rhythm: Tachycardia present.     Comments: 1+ DP/PT pulses bilaterally.  Compartments are soft.  See below images for documentation of lower extremity wounds.  Right heel wound shows central eschar with surrounding malodorous drainage Pulmonary:     Effort: Pulmonary effort is normal.  Abdominal:     Palpations: Abdomen is soft.     Tenderness: There is no abdominal tenderness. There is no guarding or rebound.     Comments: Multiple surgical scars  Musculoskeletal:     Cervical back: Neck supple.  Skin:    General: Skin is warm.             Will initiate sepsis order set. Blood pressures mildly hypotensive so will start 30 cc/kg bolus.  Will start on IV vancomycin and Rocephin (which she has tolerated in the past) and consult pharmacy for further recommendations.  Will likely require admission.      Renita Papa, PA-C 05/01/20 1452    Lajean Saver, MD 05/01/20 1539

## 2020-05-01 NOTE — Progress Notes (Signed)
Pharmacy Antibiotic Note  Wanda [name] is a 59 y.o. female admitted on 05/01/2020 with sepsis.  Pharmacy has been consulted for Vancomycin dosing.  Plan: Vancomycin 1500mg  IV x1 then Vancomycin 750mg  IV Q24h (nomogram dosing) Monitor renal function, CBC, and s/sx of clinical improvement  Height: 5\' 9"  (175.3 cm) Weight: 78.5 kg (173 lb) IBW/kg (Calculated) : 66.2  Temp (24hrs), Avg:101 F (38.3 C), Min:101 F (38.3 C), Max:101 F (38.3 C)  Recent Labs  Lab 05/01/20 1500  WBC 24.1*  CREATININE 3.09*  LATICACIDVEN 1.0    Estimated Creatinine Clearance: 20.5 mL/min (A) (by C-G formula based on SCr of 3.09 mg/dL (H)).    Allergies  Allergen Reactions  . Hydrocodone-Acetaminophen Other (See Comments)    Severe headache  . Hydrocodone-Acetaminophen     Other reaction(s): headache, Other (See Comments), Unknown Severe headache   . Propoxyphene N-Acetaminophen Other (See Comments)    Severe headache  . Astemizole     Other reaction(s): headache, Other (See Comments), Unknown Rash   . Cephalosporins Rash    Other reaction(s): Other (See Comments), Unknown Rash     Antimicrobials this admission: Vancomycin 7/30 >>  Ceftriaxone 7/30 >>   Dose adjustments this admission:   Microbiology results: 7/30 BCx: pending 7/30 UCx: pending   Thank you for allowing pharmacy to be a part of this patient's care.  Beckey Rutter, PharmD Candidate 05/01/2020 4:11 PM

## 2020-05-01 NOTE — ED Triage Notes (Signed)
Pt brought to ED via EMS from home with c/o bilateral foot sores, lethargy, fever x2 days. Denies pain, n/v. A&O x4. Right foot bandaged, blister present on left foot. Rectal temp 101 in ED. Former dialysis pt, bed bound at baseline, kidney transplants x2.  EMS v/s: 120 pulse, sinus tach on EKG 106/59 BP 96% RA 115 CBG

## 2020-05-02 ENCOUNTER — Encounter (HOSPITAL_COMMUNITY): Payer: Self-pay | Admitting: Internal Medicine

## 2020-05-02 ENCOUNTER — Other Ambulatory Visit: Payer: Self-pay

## 2020-05-02 ENCOUNTER — Encounter (HOSPITAL_COMMUNITY): Payer: Medicare HMO

## 2020-05-02 LAB — GLUCOSE, CAPILLARY
Glucose-Capillary: 82 mg/dL (ref 70–99)
Glucose-Capillary: 111 mg/dL — ABNORMAL HIGH (ref 70–99)
Glucose-Capillary: 160 mg/dL — ABNORMAL HIGH (ref 70–99)
Glucose-Capillary: 158 mg/dL — ABNORMAL HIGH (ref 70–99)

## 2020-05-02 LAB — COMPREHENSIVE METABOLIC PANEL
ALT: 10 U/L (ref 0–44)
AST: 21 U/L (ref 15–41)
Albumin: 1.5 g/dL — ABNORMAL LOW (ref 3.5–5.0)
Alkaline Phosphatase: 301 U/L — ABNORMAL HIGH (ref 38–126)
Anion gap: 8 (ref 5–15)
BUN: 46 mg/dL — ABNORMAL HIGH (ref 6–20)
CO2: 16 mmol/L — ABNORMAL LOW (ref 22–32)
Calcium: 8.4 mg/dL — ABNORMAL LOW (ref 8.9–10.3)
Chloride: 110 mmol/L (ref 98–111)
Creatinine, Ser: 2.81 mg/dL — ABNORMAL HIGH (ref 0.44–1.00)
GFR calc Af Amer: 20 mL/min — ABNORMAL LOW (ref >60)
GFR calc non Af Amer: 18 mL/min — ABNORMAL LOW (ref >60)
Glucose, Bld: 100 mg/dL — ABNORMAL HIGH (ref 70–99)
Potassium: 4.8 mmol/L (ref 3.5–5.1)
Sodium: 134 mmol/L — ABNORMAL LOW (ref 135–145)
Total Bilirubin: 0.9 mg/dL (ref 0.3–1.2)
Total Protein: 5.3 g/dL — ABNORMAL LOW (ref 6.5–8.1)

## 2020-05-02 LAB — CBC
HCT: 27.8 % — ABNORMAL LOW (ref 36.0–46.0)
Hemoglobin: 8.7 g/dL — ABNORMAL LOW (ref 12.0–15.0)
MCH: 31.8 pg (ref 26.0–34.0)
MCHC: 31.3 g/dL (ref 30.0–36.0)
MCV: 101.5 fL — ABNORMAL HIGH (ref 80.0–100.0)
Platelets: 323 10*3/uL (ref 150–400)
RBC: 2.74 MIL/uL — ABNORMAL LOW (ref 3.87–5.11)
RDW: 15.8 % — ABNORMAL HIGH (ref 11.5–15.5)
WBC: 23.6 10*3/uL — ABNORMAL HIGH (ref 4.0–10.5)
nRBC: 0 % (ref 0.0–0.2)

## 2020-05-02 LAB — PROTIME-INR
INR: 2.1 — ABNORMAL HIGH (ref 0.8–1.2)
Prothrombin Time: 23 seconds — ABNORMAL HIGH (ref 11.4–15.2)

## 2020-05-02 LAB — HEMOGLOBIN A1C
Hgb A1c MFr Bld: 4.1 % — ABNORMAL LOW (ref 4.8–5.6)
Mean Plasma Glucose: 70.97 mg/dL

## 2020-05-02 LAB — MRSA PCR SCREENING: MRSA by PCR: NEGATIVE

## 2020-05-02 LAB — C-REACTIVE PROTEIN: CRP: 22.3 mg/dL — ABNORMAL HIGH (ref <1.0)

## 2020-05-02 LAB — LACTIC ACID, PLASMA: Lactic Acid, Venous: 1.3 mmol/L (ref 0.5–1.9)

## 2020-05-02 MED ORDER — PROSOURCE PLUS PO LIQD
30.0000 mL | Freq: Two times a day (BID) | ORAL | Status: DC
  Administered 2020-05-02 – 2020-05-06 (×4): 30 mL via ORAL
  Filled 2020-05-02 (×10): qty 30

## 2020-05-02 MED ORDER — ENSURE ENLIVE PO LIQD
237.0000 mL | Freq: Three times a day (TID) | ORAL | Status: DC
  Administered 2020-05-02 – 2020-06-01 (×48): 237 mL via ORAL

## 2020-05-02 MED ORDER — ADULT MULTIVITAMIN W/MINERALS CH
1.0000 | ORAL_TABLET | Freq: Every day | ORAL | Status: DC
  Administered 2020-05-02 – 2020-06-01 (×30): 1 via ORAL
  Filled 2020-05-02 (×30): qty 1

## 2020-05-02 MED ORDER — TACROLIMUS 1 MG PO CAPS
1.5000 mg | ORAL_CAPSULE | Freq: Two times a day (BID) | ORAL | Status: DC
  Administered 2020-05-02 – 2020-05-07 (×10): 1.5 mg via ORAL
  Filled 2020-05-02 (×11): qty 1

## 2020-05-02 NOTE — Progress Notes (Signed)
Initial Nutrition Assessment  RD working remotely.  DOCUMENTATION CODES:   Not applicable, suspect malnutrition but unable to confirm at this time  INTERVENTION:   - Ensure Enlive po TID, each supplement provides 350 kcal and 20 grams of protein  - ProSource Plus 30 ml po BID, each supplement provides 100 kcal and 15 grams of protein  - MVI with minerals daily  NUTRITION DIAGNOSIS:   Increased nutrient needs related to chronic illness, wound healing as evidenced by estimated needs.  GOAL:   Patient will meet greater than or equal to 90% of their needs  MONITOR:   PO intake, Supplement acceptance, Labs, Weight trends, Skin  REASON FOR ASSESSMENT:   Consult Assessment of nutrition requirement/status  ASSESSMENT:   59 year old female who presented on 7/30 with acute worsening of right foot wound. PMH of anemia, ESRD s/p failed kidney transplant in 2013 and repeat transplatnt in September 2020 with current CKD stage IV, T2DM, HLD, PAD, bariatric surgery (gastric sleeve). Pt admitted with sepsis.   Attempted to speak with pt via phone call to room; however, no answer.  Per chart review, pt has been bedbound/wheelchair-bound since September 2020.  Reviewed RD notes from recent admission (May 2021). Pt met criteria and was diagnosed with moderate malnutrition in context of chronic illness during this admission. Suspect malnutrition persists, but RD is unable to confirm without NFPE or diet history.  During admission in May 2021, pt reported "fair appetite" and consuming mostly soft textured foods at home secondary to missing teeth. At this time, pt reported eating 3 meals daily and endorsed consuming smaller meals related to gastric sleeve procedure in 2015. Pt was accepting of oral nutrition supplements during this admission. RD will order oral nutrition supplements to aid pt in meeting kcal and protein needs.  Pt would benefit from nutrient-dense supplement. One Ensure Enlive  supplement provides 350 kcals, 20 grams protein, and 44-45 grams of carbohydrate vs one Glucerna Shake supplement which provides 220 kcals, 10 grams of protein, and 26 grams of carbohydrate. Given pt's history of DM, RD will continue to monitor PO intake and CBG's and adjust supplement regimen as appropriate.  Reviewed weight history in chart. Pt with a 9.8 kg weight loss since 02/11/20. This is a 12.3% weight loss in less than 3 months which is severe and significant for timeframe.  Meal Completion: 50% x 1 meal  Medications reviewed and include: folic acid, SSI, protonix, prednisone, prograf, thiamine, IV abx IVF: LR @ 150 ml/hr  Labs reviewed: sodium 134, hemoglobin 8.7 CBG's: 82-98  NUTRITION - FOCUSED PHYSICAL EXAM:  Unable to complete at this time. RD working remotely.  Diet Order:   Diet Order            Diet Heart Room service appropriate? Yes; Fluid consistency: Thin  Diet effective now                 EDUCATION NEEDS:   Not appropriate for education at this time  Skin:  Skin Assessment: Skin Integrity Issues: Stage II: sacrum, right buttocks, left buttocks Unstageable: left heel, right heel Other: non-pressure wounds to right toe and left toe, cellulitis to right foot  Last BM:  05/01/20  Height:   Ht Readings from Last 1 Encounters:  05/01/20 '5\' 9"'  (1.753 m)    Weight:   Wt Readings from Last 1 Encounters:  05/01/20 70.1 kg    Ideal Body Weight:  65.9 kg  BMI:  Body mass index is 22.82 kg/m.  Estimated  Nutritional Needs:   Kcal:  2000-2200  Protein:  110-130 grams  Fluid:  >/= 2.0 L    Gaynell Face, MS, RD, LDN Inpatient Clinical Dietitian Please see [facility] for contact information.

## 2020-05-02 NOTE — Progress Notes (Signed)
VASCULAR LAB   Patient had non compressible ABIs done 02/06/20 as well as a bilateral lower extremity arterial duplex 02/27/20.  Please see results posted below, or under results review.  Sherley Mckenney, RVT 05/02/2020, 7:11 AM   LOWER EXTREMITY DOPPLER STUDY   Indications: Ulceration.   High Risk Factors: Diabetes.   Other Factors: Leg pain.  Performing Technologist: June Leap Rvt, Rdms     Examination Guidelines: A complete evaluation includes at minimum, Doppler  waveform signals and systolic blood pressure reading at the level of  bilateral  brachial, anterior tibial, and posterior tibial arteries, when vessel  segments  are accessible. Bilateral testing is considered an integral part of a  complete  examination. Photoelectric Plethysmograph (PPG) waveforms and toe systolic  pressure readings are included as required and additional duplex testing  as  needed. Limited examinations for reoccurring indications may be performed  as  noted.     ABI Findings:  +---------+------------------+-----+----------+----------------------------  ----+  Right  Rt Pressure (mmHg)IndexWaveform Comment               +---------+------------------+-----+----------+----------------------------  ----+  Brachial                  unable to take BP due to                             restricted arm            +---------+------------------+-----+----------+----------------------------  ----+  ATA   255           biphasic                    +---------+------------------+-----+----------+----------------------------  ----+  PTA   255           monophasic                   +---------+------------------+-----+----------+----------------------------  ----+  Charlean Merl           Abnormal                     +---------+------------------+-----+----------+----------------------------  ----+   +---------+------------------+-----+----------+----------------------------  ----+  Left   Lt Pressure (mmHg)IndexWaveform Comment               +---------+------------------+-----+----------+----------------------------  ----+  Brachial                  unable to take BP due to IV                           location upper arm          +---------+------------------+-----+----------+----------------------------  ----+  ATA   255           biphasic                    +---------+------------------+-----+----------+----------------------------  ----+  PTA   255           monophasic                   +---------+------------------+-----+----------+----------------------------  ----+  Sharyl Nimrod           Abnormal                    +---------+------------------+-----+----------+----------------------------  ----+        Summary:    Cannot determine index as Brachial pressures cannot be taken from  either  arm.  Bilateral pedal vessels are non compressible and bilateral toe waveforms  are abnormal.    *See table(s) above for measurements and observations.      Electronically signed by Servando Snare MD on 02/06/2020 at 5:28:43 PM  LOWER EXTREMITY ARTERIAL DUPLEX STUDY    Current ABI: Noncompressible pedal arteries bilaterally. 02/06/2020   Comparison Study: No prior study   Performing Technologist: Maudry Mayhew MHA, RDMS, RVT, RDCS     Examination Guidelines: A complete evaluation includes B-mode imaging,  spectral  Doppler, color Doppler, and power Doppler as needed of all accessible  portions  of each vessel. Bilateral testing is considered an integral part of a  complete  examination.  Limited examinations for reoccurring indications may be  performed  as noted.     +-----------+--------+-----+--------+----------+--------+  RIGHT   PSV cm/sRatioStenosisWaveform Comments  +-----------+--------+-----+--------+----------+--------+  CFA Distal 74          triphasic       +-----------+--------+-----+--------+----------+--------+  DFA    52          biphasic       +-----------+--------+-----+--------+----------+--------+  SFA Prox  65          triphasic       +-----------+--------+-----+--------+----------+--------+  SFA Mid  68          triphasic       +-----------+--------+-----+--------+----------+--------+  SFA Distal 70          triphasic       +-----------+--------+-----+--------+----------+--------+  POP Distal 66          triphasic       +-----------+--------+-----+--------+----------+--------+  TP Trunk  82          triphasic       +-----------+--------+-----+--------+----------+--------+  ATA Distal 64          monophasic      +-----------+--------+-----+--------+----------+--------+  PTA Distal 51          monophasic      +-----------+--------+-----+--------+----------+--------+  PERO Distal45          monophasic      +-----------+--------+-----+--------+----------+--------+        +-----------+--------+-----+--------+----------+--------+  LEFT    PSV cm/sRatioStenosisWaveform Comments  +-----------+--------+-----+--------+----------+--------+  CFA Distal 69          triphasic       +-----------+--------+-----+--------+----------+--------+  DFA    54          triphasic       +-----------+--------+-----+--------+----------+--------+  SFA Prox  71          triphasic        +-----------+--------+-----+--------+----------+--------+  SFA Mid  63          triphasic       +-----------+--------+-----+--------+----------+--------+  SFA Distal 64          triphasic       +-----------+--------+-----+--------+----------+--------+  POP Distal 68          triphasic       +-----------+--------+-----+--------+----------+--------+  TP Trunk  69          monophasic      +-----------+--------+-----+--------+----------+--------+  ATA Distal 64          triphasic       +-----------+--------+-----+--------+----------+--------+  PTA Prox  26          monophasic      +-----------+--------+-----+--------+----------+--------+  PTA Mid  20          monophasic      +-----------+--------+-----+--------+----------+--------+  PTA Distal  absent        +-----------+--------+-----+--------+----------+--------+  PERO Distal62          triphasic       +-----------+--------+-----+--------+----------+--------+        Summary:  Bilateral: No evidence of hemodynamically significant stenosis involving  the visualized arteries of bilateral lower extremities. Waveforms are  diminished distally in bilateral lower extremities.     See table(s) above for measurements and observations.      Electronically signed by Deitra Mayo MD on 02/07/2020 at 7:24:40 PM.

## 2020-05-02 NOTE — Progress Notes (Signed)
Patient ID: Wanda [name], female   DOB: 20-May-1961, 59 y.o.   MRN: [id number]  PROGRESS NOTE    Wanda [name]  ENI:[id number] DOB: [birth year] DOA: 05/01/2020 PCP: Lorene Dy, MD   Brief Narrative:   59 y.o. female with medical history significant of diabetes type 2, hypertension, paroxysmal A. fib on Eliquis, hyperlipidemia, ESRD with failed kidney transplant in 2013 and repeat transplant in September 2020 at [facility] with current chronic renal disease stage IV, anemia of chronic disease, history of gastric sleeve surgery, hypoalbuminemia, right foot ulcer, chronic neuropathy presented with worsening right foot ulcer drainage getting worse over the last [redacted] weeks along with fever for 2 days.  On presentation, she was febrile with leukocytosis; x-ray of the right foot did not show any evidence of osteomyelitis.  She was started on IV fluids and antibiotics.  Assessment & Plan:   Sepsis: Present on admission Right foot ulcer infection with surrounding cellulitis, rule out osteomyelitis Leukocytosis -Patient presented with worsening right foot ulcer drainage over the last [redacted] weeks along with fever for the last couple of days.  Right foot x-ray does not show evidence of osteomyelitis. -Blood pressure currently on the lower side but stable.  Currently on IV fluids. Continue Rocephin and vancomycin.  Follow cultures. -MRI of the right foot without contrast did not show any evidence of osteomyelitis but showed superficial phlegmon overlying the posterior calcaneus. -Wound care consultation -Recent lower extremity arterial duplex did not show any significant stenosis involving the visualized arteries of bilateral lower extremities -CRP 22.3 and white cell count is 23.6 this morning  Chronic kidney disease stage IV Metabolic acidosis History of ESRD status post renal transplant on chronic immunosuppressive medications -Baseline creatinine around 2.90-3.  Creatinine on presentation was 3.09.   Creatinine 2.81 this morning -Continue with IV fluids.  Monitor creatinine.  Strict input and output.  Avoid nephrotoxic medications. -Continue home dosing of prednisone along with tacrolimus and Valtrex.  Hold Bactrim until nephrology evaluation.  Follow nephrology recommendations.  Paroxysmal A. Fib -Held Eliquis in case patient undergoes surgical intervention. -Continue amiodarone.  Currently rate controlled  Anemia of chronic disease -No signs of bleeding.  Monitor.  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  CBGs with SSI.  Carb modified diet.    DVT prophylaxis: Heparin Code Status: Full Family Communication: None at bedside Disposition Plan: Status is: Inpatient  Remains inpatient appropriate because:Inpatient level of care appropriate due to severity of illness   Dispo: The patient is from: Home              Anticipated d/c is to: Home              Anticipated d/c date is: > 3 days              Patient currently is not medically stable to d/c.   Consultants: Nephrology  Procedures: None  Antimicrobials: Rocephin and vancomycin from 05/01/2020 onwards   Subjective: Patient seen and examined at bedside.  She feels slightly better.  Still feels weak.  Denies worsening foot pain no overnight fever, vomiting or shortness of breath reported.  Objective: Vitals:   05/01/20 2242 05/01/20 2345 05/01/20 2350 05/02/20 0327  BP: (!) 134/66  (!) 125/37 (!) 100/44  Pulse: 98   94  Resp: 22 14 16 15   Temp: 97.8 F (36.6 C)  98.8 F (37.1 C) 98.6 F (37 C)  TempSrc: Oral  Oral Oral  SpO2: 100%  98% 98%  Weight:  70.1 kg    Height:  5\' 9"  (1.753 m)      Intake/Output Summary (Last 24 hours) at 05/02/2020 0732 Last data filed at 05/02/2020 0400 Gross per 24 hour  Intake 1171.51 ml  Output 300 ml  Net 871.51 ml   Filed Weights   05/01/20 1444 05/01/20 2345  Weight: 78.5 kg 70.1  kg    Examination:  General exam: Appears calm and comfortable.  Looks older than stated age.  Looks chronically ill Respiratory system: Bilateral decreased breath sounds at bases Cardiovascular system: S1 & S2 heard, Rate controlled Gastrointestinal system: Abdomen is nondistended, soft and nontender. Normal bowel sounds heard. Extremities: No cyanosis, clubbing, edema  Central nervous system: Alert and oriented.  Slow to respond to questions.  Poor historian.  No focal neurological deficits. Moving extremities Skin: Right foot dressing present. Right antecubital fossa fistula present Psychiatry: Flat affect.  Poor historian.    Data Reviewed: I have personally reviewed following labs and imaging studies  CBC: Recent Labs  Lab 05/01/20 1500 05/01/20 2015 05/02/20 0106  WBC 24.1* 22.9* 23.6*  NEUTROABS 21.9*  --   --   HGB 8.5* 10.4* 8.7*  HCT 27.4* 34.3* 27.8*  MCV 103.8* 104.9* 101.5*  PLT 337 352 267   Basic Metabolic Panel: Recent Labs  Lab 05/01/20 1500 05/02/20 0106  NA 136 134*  K 4.9 4.8  CL 111 110  CO2 16* 16*  GLUCOSE 85 100*  BUN 51* 46*  CREATININE 3.09* 2.81*  CALCIUM 8.3* 8.4*   GFR: Estimated Creatinine Clearance: 22.5 mL/min (A) (by C-G formula based on SCr of 2.81 mg/dL (H)). Liver Function Tests: Recent Labs  Lab 05/01/20 1500 05/02/20 0106  AST 18 21  ALT 9 10  ALKPHOS 287* 301*  BILITOT 0.8 0.9  PROT 5.2* 5.3*  ALBUMIN 1.5* 1.5*   No results for input(s): LIPASE, AMYLASE in the last 168 hours. No results for input(s): AMMONIA in the last 168 hours. Coagulation Profile: Recent Labs  Lab 05/01/20 1500 05/02/20 0106  INR 2.7* 2.1*   Cardiac Enzymes: No results for input(s): CKTOTAL, CKMB, CKMBINDEX, TROPONINI in the last 168 hours. BNP (last 3 results) No results for input(s): PROBNP in the last 8760 hours. HbA1C: No results for input(s): HGBA1C in the last 72 hours. CBG: Recent Labs  Lab 05/01/20 2037 05/01/20 2147  05/02/20 0603  GLUCAP 85 98 82   Lipid Profile: No results for input(s): CHOL, HDL, LDLCALC, TRIG, CHOLHDL, LDLDIRECT in the last 72 hours. Thyroid Function Tests: No results for input(s): TSH, T4TOTAL, FREET4, T3FREE, THYROIDAB in the last 72 hours. Anemia Panel: No results for input(s): VITAMINB12, FOLATE, FERRITIN, TIBC, IRON, RETICCTPCT in the last 72 hours. Sepsis Labs: Recent Labs  Lab 05/01/20 1500 05/02/20 0106  LATICACIDVEN 1.0 1.3    Recent Results (from the past 240 hour(s))  SARS Coronavirus 2 by RT PCR (hospital order, performed in [facility] hospital lab) Nasopharyngeal Nasopharyngeal Swab     Status: None   Collection Time: 05/01/20  5:27 PM   Specimen: Nasopharyngeal Swab  Result Value Ref Range Status   SARS Coronavirus 2 NEGATIVE NEGATIVE Final    Comment: (NOTE) SARS-CoV-2 target nucleic acids are NOT DETECTED.  The SARS-CoV-2 RNA is generally detectable in upper and lower respiratory specimens during the acute phase of infection. The lowest concentration of SARS-CoV-2 viral copies this assay can detect is 250 copies / mL. A negative result does not preclude  SARS-CoV-2 infection and should not be used as the sole basis for treatment or other patient management decisions.  A negative result may occur with improper specimen collection / handling, submission of specimen other than nasopharyngeal swab, presence of viral mutation(s) within the areas targeted by this assay, and inadequate number of viral copies (<250 copies / mL). A negative result must be combined with clinical observations, patient history, and epidemiological information.  Fact Sheet for Patients:   StrictlyIdeas.no  Fact Sheet for Healthcare Providers: BankingDealers.co.za  This test is not yet approved or  cleared by the [place] FDA and has been authorized for detection and/or diagnosis of SARS-CoV-2 by FDA under an Emergency Use  Authorization (EUA).  This EUA will remain in effect (meaning this test can be used) for the duration of the COVID-19 declaration under Section 564(b)(1) of the Act, 21 U.S.C. section 360bbb-3(b)(1), unless the authorization is terminated or revoked sooner.  Performed at [facility], [place] [address], [place], [place] [address]   MRSA PCR Screening     Status: None   Collection Time: 05/01/20 11:38 PM   Specimen: Nasal Mucosa; Nasopharyngeal  Result Value Ref Range Status   MRSA by PCR NEGATIVE NEGATIVE Final    Comment:        The GeneXpert MRSA Assay (FDA approved for NASAL specimens only), is one component of a comprehensive MRSA colonization surveillance program. It is not intended to diagnose MRSA infection nor to guide or monitor treatment for MRSA infections. Performed at [facility], [place] [address], [place], [place] [address]          Radiology Studies: DG Tibia/Fibula Right  Result Date: 05/01/2020 CLINICAL DATA:  Pain.  Fever.  Possible infection. EXAM: RIGHT TIBIA AND FIBULA - 2 VIEW COMPARISON:  None. FINDINGS: Cortical margins of the tibia and fibula are intact. No periosteal reaction or bony destruction. There is no evidence of fracture or other focal bone lesions. Arterial vascular calcifications. Scattered venous phleboliths. Minimal skin thickening/soft tissue prominence about the medial aspect of the mid leg. No soft tissue air or radiopaque foreign body. IMPRESSION: 1. Minimal skin thickening/soft tissue prominence about the medial aspect of the mid leg is nonspecific. No soft tissue air or radiopaque foreign body. 2. No acute osseous abnormality. No radiographic evidence of osteomyelitis. Electronically Signed   By: Keith Rake M.D.   On: 05/01/2020 16:09   MR FOOT RIGHT WO CONTRAST  Result Date: 05/01/2020 CLINICAL DATA:  Infection fever, question of osteomyelitis EXAM: MRI OF THE RIGHT FOREFOOT WITHOUT CONTRAST TECHNIQUE: Multiplanar,  multisequence MR imaging of the right was performed. No intravenous contrast was administered. COMPARISON:  None. FINDINGS: Bones/Joint/Cartilage There is mildly increased T2 hyperintense signal seen at the posterior calcaneus. No definite T1 hypointensity seen within this area. No definite area of cortical destruction or periosteal reaction. The remainder of the osseous structures are intact. There is a small ankle and subtalar joint effusion. Ligaments The Lisfranc ligaments are intact. Muscles and Tendons Mild diffuse fatty atrophy with increased signal seen with within the muscles surrounding the forefoot. The flexor and extensor tendons are intact. The Achilles tendon and plantar fascia are intact. Soft tissues Area superficial ulceration seen over the posterior calcaneus. There is a tiny superficial fluid collection seen overlying the lateral aspect of the posterior calcaneus measuring 1.1 cm in transverse dimension. No sinus tract or loculated fluid collection. There is extensive heel pad inflammation. IMPRESSION: Findings of superficial ulceration with extensive surrounding cellulitis  and a tiny superficial 1 cm phlegmon overlying the posterior calcaneus. No definite evidence of osteomyelitis. Extensive heel pad inflammation. Small ankle joint effusion. Findings suggestive of chronic denervation atrophy. Electronically Signed   By: Prudencio Pair M.D.   On: 05/01/2020 19:45   DG Chest Port 1 View  Result Date: 05/01/2020 CLINICAL DATA:  59 year old female with possible sepsis. EXAM: PORTABLE CHEST 1 VIEW COMPARISON:  Portable chest 02/04/2020 and earlier. FINDINGS: Portable AP semi upright view at 1446 hours. Stable mild elevation of the left hemidiaphragm. Lung volumes and mediastinal contours remain within normal limits. Continued indistinct left perihilar opacity since May. Allowing for portable technique the right lung remains clear. Negative trachea. No pneumothorax or pleural effusion. Negative  visible bowel gas pattern. No acute osseous abnormality identified. IMPRESSION: 1. Persistent vague left perihilar lung opacity since May. Recommend either PA and lateral chest x-ray or follow-up Chest CT to further characterize. 2. No other cardiopulmonary abnormality. Electronically Signed   By: Genevie Ann M.D.   On: 05/01/2020 15:03   DG Foot Complete Left  Result Date: 05/01/2020 CLINICAL DATA:  Infection. Fever. Technologist notes state: Foot sores. EXAM: LEFT FOOT - COMPLETE 3+ VIEW COMPARISON:  None. FINDINGS: Mild hammertoe deformity of the digits. No fracture. No erosion, periosteal reaction, or evidence of bony destruction. Small Achilles tendon enthesophyte. Skin irregularity about the posterior heel. No radiopaque foreign body. There are vascular calcifications. IMPRESSION: 1. Skin irregularity about the posterior heel, represent soft tissue ulcer. No radiographic findings of osteomyelitis. 2. Mild hammertoe deformity of the digits. Electronically Signed   By: Keith Rake M.D.   On: 05/01/2020 16:08   DG Foot Complete Right  Result Date: 05/01/2020 CLINICAL DATA:  Infection. Fever. Technologist notes state: Foot sores. EXAM: RIGHT FOOT COMPLETE - 3+ VIEW COMPARISON:  Radiograph 02/05/2020.  Foot MRI 02/06/2020. FINDINGS: Mild hammertoe deformity of the digits. Mild hallux valgus. No fracture. No periosteal reaction, bony destruction, or abnormal density to suggest osteomyelitis. There is skin irregularity involving the posterior aspect of the heel. There is a faint curvilinear 11 mm density in the soft tissues that is seen only on the lateral view. Mild hindfoot osteoarthritis. There are vascular calcifications. IMPRESSION: 1. Soft tissue irregularity involving the posterior aspect of the heel, suspicious for ulcer. Faint curvilinear 11 mm density in the soft tissues may represent a foreign body. This is also seen on prior exam, however head no MRI correlate. 2. No radiographic findings of  osteomyelitis. 3. Mild hammertoe deformity of the digits and hallux valgus. No acute osseous abnormality. Electronically Signed   By: Keith Rake M.D.   On: 05/01/2020 16:04        Scheduled Meds: . amiodarone  200 mg Oral Daily  . folic acid  1 mg Oral Daily  . heparin  5,000 Units Subcutaneous Q8H  . insulin aspart  0-5 Units Subcutaneous QHS  . insulin aspart  0-6 Units Subcutaneous TID WC  . nortriptyline  25 mg Oral QHS  . pantoprazole  40 mg Oral Daily  . predniSONE  5 mg Oral Q breakfast  . tacrolimus  3 mg Oral Q12H  . thiamine  100 mg Oral Daily   Continuous Infusions: . cefTRIAXone (ROCEPHIN)  IV    . lactated ringers 150 mL/hr at 05/02/20 0619  . vancomycin            Aline August, MD [facility] 05/02/2020, 7:32 AM

## 2020-05-02 NOTE — Consult Note (Signed)
Wanda [name] Admit Date: 05/01/2020 05/02/2020 Rexene Agent Requesting Physician:  Starla Link MD  Reason for Consult:  comanagement of CKD4 and transplant status HPI:  59W complicated past history including DM 2, hypertension, ESRD status post 2 kidney transplants, most recently at [facility] on 5/46/2703 complicated by delayed graft function and dialysis dependence for approximately 2 months.  Post transplant course has been complicated by recurrent UTIs, progressive debility/failure to thrive, pneumonia, history of gastric sleeve.  She was admitted to [facility] 5/4 through 5/11 of this year with AKI, malnutrition.  She had sepsis related to a UTI from Klebsiella and Enterococcus.  Her discharge creatinine was around 3.0.  Her current transplant is treated with tacrolimus and prednisone.  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  It appears she also has been receiving infusions of belatacept.  She is on prophylactic Bactrim and Valcyte.  She follows in our office with Dr. Posey Pronto but has not been seen since before her previous hospitalization, she does have an appointment scheduled for middle of August.  She presented yesterday to the emergency room with progressive fever, chills, nausea.  She was found to have a foul-smelling foot ulcer on the right side with drainage, leukocytosis.  She was placed on ceftriaxone and vancomycin.  She received MRI of the foot overnight with findings of cellulitis but no clear evidence of osteomyelitis.  At the time of my evaluation the patient is groggy and not very informative.  Her current MAR has tacrolimus of 3 mg twice daily, but I believe she takes 1.5 mg twice daily, I am not able to confirm this with her.  She is also on prednisone.  Creatinine appears to be at recent baseline.  Electrolytes are stable with a potassium of 4.8.  She does have a mild acidosis, which is chronic, with a bicarbonate of 16 and anion gap of  at least 8.  She denies graft tenderness.    Creat (mg/dL)  Date Value  07/10/2014 8.80 (H)   Creatinine, Ser (mg/dL)  Date Value  05/02/2020 2.81 (H)  05/01/2020 3.09 (H)  02/11/2020 2.94 (H)  02/10/2020 3.10 (H)  02/09/2020 3.15 (H)  02/08/2020 3.20 (H)  02/07/2020 2.95 (H)  02/06/2020 2.75 (H)  02/05/2020 2.75 (H)  02/04/2020 2.74 (H)  ] I/Os: I/O last 3 completed shifts: In: 1171.5 [I.V.:1171.5] Out: 300 [Urine:300]   ROS NSAIDS: No identified exposure IV Contrast no exposure TMP/SMX takes prophylactic dosing, held at admission Hypotension not present Balance of 12 systems is negative w/ exceptions as above  PMH  Past Medical History:  Diagnosis Date  . Anemia   . Arthritis    "hands" (07/24/2013) pt denies  . Chronic kidney disease, stage IV (severe) ([place])    "never went on dialysis" (07/24/2013)  . Diabetes mellitus type II   . Diabetic peripheral neuropathy ([place])   . Dialysis patient [facility])    Monday Wednesday Friday   . Dyspnea on exertion    pt denies  . Foot pain   . GERD (gastroesophageal reflux disease)   . Gout   . History of chicken pox   . History of measles   . History of mumps   . Hyperlipidemia   . Hypothyroidism   . Migraines    "q 3 months" (07/24/2013)  . Nonspecific abnormal unspecified cardiovascular function study   . Obesity   . PAD (peripheral artery disease) ([place])   . Peripheral neuropathy   .  Peripheral vascular disease ([place])   . Pneumonia 09/2012  . Renal transplant disorder   . Shingles   . Sleep apnea    "getting ready to get tested again cause dr says I have it" (07/24/2013) pt states was tested and was told she did not have sleep apnea / no cpap  . Unspecified essential hypertension    PSH  Past Surgical History:  Procedure Laterality Date  . AV FISTULA PLACEMENT Right 2000   "lower arm" (07/24/2013)  . AV FISTULA PLACEMENT, RADIOCEPHALIC Right 6948   "upper arm" (07/24/2013)  . CAPD REMOVAL N/A 10/10/2016    Procedure: CONTINUOUS AMBULATORY PERITONEAL DIALYSIS  (CAPD) CATHETER REMOVAL;  Surgeon: Donnie Mesa, MD;  Location: [facility];  Service: General;  Laterality: N/A;  . CATARACT EXTRACTION W/ INTRAOCULAR LENS  IMPLANT, BILATERAL Bilateral 2011  . [place]; 1987  . CHOLECYSTECTOMY  2011  . COLONOSCOPY    . ENDOMETRIAL CRYOABLATION  1980's  . INTRAUTERINE DEVICE (IUD) INSERTION  2011  . LAPAROSCOPIC GASTRIC SLEEVE RESECTION N/A 05/26/2015   Procedure: LAPAROSCOPIC GASTRIC SLEEVE RESECTION;  Surgeon: Excell Seltzer, MD;  Location: [facility];  Service: General;  Laterality: N/A;  . TRANSPLANTATION RENAL  2013   "right" (09/04/2012)  . TUBAL LIGATION  1987  . [place]   "RLE; stripped" (09/04/2012)  . VIDEO BRONCHOSCOPY  09/07/2012   Procedure: VIDEO BRONCHOSCOPY WITHOUT FLUORO;  Surgeon: Chesley Mires, MD;  Location: [facility] ENDOSCOPY;  Service: Cardiopulmonary;  Laterality: Bilateral;   FH  Family History  Problem Relation Age of Onset  . Colon cancer Father        deceased at age 40  . Diabetes Brother   . Neuropathy Neg Hx    SH  reports that she has never smoked. She has never used smokeless tobacco. She reports that she does not drink alcohol and does not use drugs. Allergies  Allergies  Allergen Reactions  . Propoxyphene N-Acetaminophen Other (See Comments)    Severe headache  . Hydrocodone-Acetaminophen Other (See Comments)     Severe headache   . Astemizole Rash and Other (See Comments)    headache   . Cephalosporins Rash   Home medications Prior to Admission medications   Medication Sig Start Date End Date Taking? Authorizing Provider  amiodarone (PACERONE) 200 MG tablet Take 200 mg by mouth daily.   Yes [provider]  apixaban (ELIQUIS) 5 MG TABS tablet Take 5 mg by mouth every 12 (twelve) hours.   Yes [provider]  Cholecalciferol (VITAMIN D-3) 25 MCG (1000 UT) CAPS Take 1,000 Units by mouth daily.   Yes [provider]  Ensure (ENSURE) Take 237 mLs by mouth 2 (two) times daily as needed (meal supplement).   Yes [provider]  folic acid (FOLVITE) 1 MG tablet Take 1 mg by mouth daily.   Yes [provider]  furosemide (LASIX) 20 MG tablet Take 20 mg by mouth daily as needed for fluid or edema.  04/18/20  Yes [provider]  hydrocortisone (ANUSOL-HC) 2.5 % rectal cream Place rectally 3 (three) times daily. Patient taking differently: Place 1 application rectally 3 (three) times daily as needed for hemorrhoids.  02/11/20  Yes Lavina Hamman, MD  megestrol (MEGACE) 40 MG/ML suspension Take 400 mg by mouth daily. 04/17/20  Yes [provider]  methenamine (HIPREX) 1 g tablet Take 1 g by mouth every 12 (twelve) hours. For UTI prevention 04/27/20  Yes [provider]  Multiple Vitamin (MULTIVITAMIN WITH MINERALS) TABS tablet Take 1 tablet by mouth daily.   Yes [provider]  nortriptyline (PAMELOR) 25 MG capsule TAKE 1 CAPSULE (25 MG TOTAL) BY MOUTH AT BEDTIME. 04/01/19  Yes Patel, Domenick Bookbinder, MD  pantoprazole (PROTONIX) 40 MG tablet Take 40 mg by mouth daily. 01/16/18  Yes [provider]  Patiromer Sorbitex Calcium (VELTASSA) 16.8 g PACK Take 16.8 mg by mouth daily. Mix in orange juice and drink   Yes [provider]  predniSONE (DELTASONE) 5 MG tablet Take 10 mg daily for 3 days and then resume 5 mg daily dose Patient taking differently: Take 5 mg by mouth daily.  02/11/20  Yes Lavina Hamman, MD  ramelteon (ROZEREM) 8 MG tablet Take 8 mg by mouth at bedtime.   Yes [provider]  tacrolimus (PROGRAF) 1 MG capsule Take 3 mg by mouth every 12 (twelve) hours.   Yes [provider]  thiamine 100 MG tablet Take 100 mg by mouth daily.   Yes [provider]  traMADol (ULTRAM) 50 MG tablet Take 50 mg by mouth 2 (two) times daily.  04/21/20  Yes [provider]  traZODone (DESYREL) 50 MG tablet Take 50  mg by mouth at bedtime.  04/18/20  Yes [provider]  atorvastatin (LIPITOR) 20 MG tablet Take 20 mg by mouth at bedtime. Patient not taking: Reported on 05/01/2020    [provider]  dicyclomine (BENTYL) 10 MG capsule Take 10 mg by mouth 3 (three) times daily as needed for spasms. Patient not taking: Reported on 05/01/2020    [provider]  famotidine (PEPCID) 20 MG tablet Take 20 mg by mouth daily as needed for heartburn. Patient not taking: Reported on 05/01/2020 06/30/18   [provider]  pregabalin (LYRICA) 25 MG capsule Take 1 capsule (25 mg total) by mouth daily. Patient not taking: Reported on 05/01/2020 02/11/20   Lavina Hamman, MD  pyridOXINE (B-6) 50 MG tablet Take 50 mg by mouth daily. Patient not taking: Reported on 05/01/2020    [provider]  tacrolimus (PROGRAF) 0.5 MG capsule Take 1.5 mg by mouth every 12 (twelve) hours. Patient not taking: Reported on 05/01/2020    [provider]  valGANciclovir (VALCYTE) 450 MG tablet Take 450 mg by mouth daily. Patient not taking: Reported on 05/01/2020    [provider]    Current Medications Scheduled Meds: . (feeding supplement) PROSource Plus  30 mL Oral BID BM  . amiodarone  200 mg Oral Daily  . feeding supplement (ENSURE ENLIVE)  237 mL Oral TID BM  . folic acid  1 mg Oral Daily  . heparin  5,000 Units Subcutaneous Q8H  . insulin aspart  0-5 Units Subcutaneous QHS  . insulin aspart  0-6 Units Subcutaneous TID WC  . multivitamin with minerals  1 tablet Oral Daily  . nortriptyline  25 mg Oral QHS  . pantoprazole  40 mg Oral Daily  . predniSONE  5 mg Oral Q breakfast  . tacrolimus  1.5 mg Oral Q12H  . thiamine  100 mg Oral Daily   Continuous Infusions: . cefTRIAXone (ROCEPHIN)  IV    . lactated ringers 150 mL/hr at 05/02/20 0619  . vancomycin     PRN Meds:.acetaminophen **OR** acetaminophen, famotidine, fentaNYL (SUBLIMAZE) injection, ondansetron **OR**  ondansetron (ZOFRAN) IV, oxyCODONE, senna-docusate  CBC Recent Labs  Lab 05/01/20 1500 05/01/20 2015 05/02/20 0106  WBC 24.1* 22.9* 23.6*  NEUTROABS 21.9*  --   --  HGB 8.5* 10.4* 8.7*  HCT 27.4* 34.3* 27.8*  MCV 103.8* 104.9* 101.5*  PLT 337 352 553   Basic Metabolic Panel Recent Labs  Lab 05/01/20 1500 05/02/20 0106  NA 136 134*  K 4.9 4.8  CL 111 110  CO2 16* 16*  GLUCOSE 85 100*  BUN 51* 46*  CREATININE 3.09* 2.81*  CALCIUM 8.3* 8.4*    Physical Exam  Blood pressure (!) 114/61, pulse (!) 108, temperature 98.7 F (37.1 C), temperature source Oral, resp. rate 18, height 5\' 9"  (1.753 m), weight 70.1 kg, SpO2 98 %. GEN: Chronically ill-appearing, cachectic ENT: Temporal wasting present EYES: EOMI, sunken eyes CV: Regular, tachycardic, no rub PULM: Coarse bilaterally ABD: Soft, nontender, no graft tenderness in the right iliac fossa SKIN: Right foot bandaged, not examined EXT: No significant edema  Assessment 42M s/p ddKT x2, most recent 06/2019 on Tac/Pred, admitted with progressive diabetic foot ulcer with surrounding of cellulitis, MRI negative for osteomyelitis; ongoing issues include failure to thrive, DM 2, hypertension.  1. CKD4 s/p ddKT on Tac/Pred 2. Diabetic foot ulcer, surrounding cellulitis, on vancomycin and ceftriaxone, blood cultures no growth to date 3. Chronic immunosuppression, I believe that her current tacrolimus dosing of 3 mg twice daily is incorrect, 1.5 mg twice daily seems to be correct; need to confirm with patient and family 4. Chronic metabolic acidosis likely related to #1 5. Leukocytosis related to #2 6. Anemia, MCV 101 7. FTT Lockie Pares /hypoalbuminemia 8. DM2 9. History of atrial fibrillation  Plan 1. Reduce tacrolimus to 1.5 mg twice daily, continue prednisone.  Agree with holding Bactrim at the current time 2. Renal function appears to be at recent baseline 3. We will continue to follow 4. Daily weights, Daily Renal Panel,  Strict I/Os, Avoid nephrotoxins (NSAIDs, judicious IV Contrast)    Rexene Agent  05/02/2020, 10:12 AM

## 2020-05-03 LAB — BASIC METABOLIC PANEL
Anion gap: 9 (ref 5–15)
BUN: 47 mg/dL — ABNORMAL HIGH (ref 6–20)
CO2: 15 mmol/L — ABNORMAL LOW (ref 22–32)
Calcium: 8.2 mg/dL — ABNORMAL LOW (ref 8.9–10.3)
Chloride: 110 mmol/L (ref 98–111)
Creatinine, Ser: 2.74 mg/dL — ABNORMAL HIGH (ref 0.44–1.00)
GFR calc Af Amer: 21 mL/min — ABNORMAL LOW (ref >60)
GFR calc non Af Amer: 18 mL/min — ABNORMAL LOW (ref >60)
Glucose, Bld: 141 mg/dL — ABNORMAL HIGH (ref 70–99)
Potassium: 5.3 mmol/L — ABNORMAL HIGH (ref 3.5–5.1)
Sodium: 134 mmol/L — ABNORMAL LOW (ref 135–145)

## 2020-05-03 LAB — GLUCOSE, CAPILLARY
Glucose-Capillary: 110 mg/dL — ABNORMAL HIGH (ref 70–99)
Glucose-Capillary: 121 mg/dL — ABNORMAL HIGH (ref 70–99)
Glucose-Capillary: 151 mg/dL — ABNORMAL HIGH (ref 70–99)
Glucose-Capillary: 112 mg/dL — ABNORMAL HIGH (ref 70–99)

## 2020-05-03 LAB — CBC WITH DIFFERENTIAL/PLATELET
Abs Immature Granulocytes: 1.07 10*3/uL — ABNORMAL HIGH (ref 0.00–0.07)
Basophils Absolute: 0 10*3/uL (ref 0.0–0.1)
Basophils Relative: 0 %
Eosinophils Absolute: 0 10*3/uL (ref 0.0–0.5)
Eosinophils Relative: 0 %
HCT: 27 % — ABNORMAL LOW (ref 36.0–46.0)
Hemoglobin: 8.6 g/dL — ABNORMAL LOW (ref 12.0–15.0)
Immature Granulocytes: 5 %
Lymphocytes Relative: 10 %
Lymphs Abs: 1.9 10*3/uL (ref 0.7–4.0)
MCH: 32.3 pg (ref 26.0–34.0)
MCHC: 31.9 g/dL (ref 30.0–36.0)
MCV: 101.5 fL — ABNORMAL HIGH (ref 80.0–100.0)
Monocytes Absolute: 1 10*3/uL (ref 0.1–1.0)
Monocytes Relative: 5 %
Neutro Abs: 15.9 10*3/uL — ABNORMAL HIGH (ref 1.7–7.7)
Neutrophils Relative %: 80 %
Platelets: 304 10*3/uL (ref 150–400)
RBC: 2.66 MIL/uL — ABNORMAL LOW (ref 3.87–5.11)
RDW: 15.9 % — ABNORMAL HIGH (ref 11.5–15.5)
WBC: 20 10*3/uL — ABNORMAL HIGH (ref 4.0–10.5)
nRBC: 0 % (ref 0.0–0.2)

## 2020-05-03 LAB — URINE CULTURE

## 2020-05-03 LAB — MAGNESIUM: Magnesium: 1.8 mg/dL (ref 1.7–2.4)

## 2020-05-03 LAB — C-REACTIVE PROTEIN: CRP: 19.9 mg/dL — ABNORMAL HIGH (ref <1.0)

## 2020-05-03 MED ORDER — GERHARDT'S BUTT CREAM
TOPICAL_CREAM | CUTANEOUS | Status: DC | PRN
  Administered 2020-05-12 – 2020-05-22 (×3): 1 via TOPICAL
  Filled 2020-05-03 (×3): qty 1

## 2020-05-03 MED ORDER — GERHARDT'S BUTT CREAM
TOPICAL_CREAM | Freq: Four times a day (QID) | CUTANEOUS | Status: DC
  Administered 2020-05-04: 1 via TOPICAL
  Filled 2020-05-03: qty 1

## 2020-05-03 NOTE — Consult Note (Signed)
[facility] Nurse Consult Note: Reason for Consult: Patient known to our service from previous admission. Left heel with firmly adherent black eschar, right heel with boggy deep tissue pressure injury, several partial thickness wounds and healing full thickness wounds on the feet noted primarily at the medial toes.  Wound type: Pressure, arterial insufficiency, neuropathic Pressure Injury POA: Yes Measurement: Per Nursing Flow sheets Wound bed: As noted above Drainage (amount, consistency, odor) Scant serous to serosanguinous Periwound: Intact, dry Dressing procedure/placement/frequency: I have implemented a conservative POC using an antimicrobial astringent dressing (xeroform gauze, Lawson # [id number]) to be applied to the wounds daily after a soap and water cleanse.  Debridement is contraindicated in the stable eschar, so drying and protecting via floatation using Prevalon Boots is recommended. A sacral foam is provided to prevent pressure injury.  [facility] nursing team will not follow, but will remain available to this patient, the nursing and medical teams.  Please re-consult if needed. Thanks, Maudie Flakes, MSN, RN, [place], Arther Abbott  Pager# [phone]
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
## 2020-05-03 NOTE — Plan of Care (Signed)
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
## 2020-05-03 NOTE — Progress Notes (Signed)
Patient ID: Wanda [name], female   DOB: 08/18/1961, 59 y.o.   MRN: [id number]  PROGRESS NOTE    BRITTANNIE TAWNEY  UYQ:[id number] DOB: [birth year] DOA: 05/01/2020 PCP: Lorene Dy, MD   Brief Narrative:   59 y.o. female with medical history significant of diabetes type 2, hypertension, paroxysmal A. fib on Eliquis, hyperlipidemia, ESRD with failed kidney transplant in 2013 and repeat transplant in September 2020 at [facility] with current chronic renal disease stage IV, anemia of chronic disease, history of gastric sleeve surgery, hypoalbuminemia, right foot ulcer, chronic neuropathy presented with worsening right foot ulcer drainage getting worse over the last [redacted] weeks along with fever for 2 days.  On presentation, she was febrile with leukocytosis; x-ray of the right foot did not show any evidence of osteomyelitis.  She was started on IV fluids and antibiotics.  Assessment & Plan:   Sepsis: Present on admission Right foot ulcer infection with surrounding cellulitis, rule out osteomyelitis Leukocytosis -Patient presented with worsening right foot ulcer drainage over the last [redacted] weeks along with fever for the last couple of days.  Right foot x-ray does not show evidence of osteomyelitis. -Blood pressure improving.  Continue Rocephin and vancomycin.    Cultures negative so far. -MRI of the right foot without contrast did not show any evidence of osteomyelitis but showed superficial phlegmon overlying the posterior calcaneus. -Wound care consultation.  Might also need orthopedics evaluation. -Recent lower extremity arterial duplex did not show any significant stenosis involving the visualized arteries of bilateral lower extremities -CRP 19.9 and white cell count is 20 this morning  Chronic kidney disease stage IV Metabolic acidosis History of ESRD status post renal transplant on chronic immunosuppressive medications -Baseline creatinine around 2.90-3.  Creatinine on presentation was 3.09.   Creatinine 2.74 this morning - Monitor creatinine.  Strict input and output.  Avoid nephrotoxic medications. -Continue home dosing of prednisone along with tacrolimus and Valtrex.  Hold Bactrim  -Nephrology following  Paroxysmal A. Fib -Held Eliquis in case patient undergoes surgical intervention. -Continue amiodarone.  Currently rate controlled  Anemia of chronic disease -No signs of bleeding.  Monitor.  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  CBGs with SSI.  Carb modified diet.    DVT prophylaxis: Heparin Code Status: Full Family Communication: None at bedside Disposition Plan: Status is: Inpatient  Remains inpatient appropriate because:Inpatient level of care appropriate due to severity of illness   Dispo: The patient is from: Home              Anticipated d/c is to: Home              Anticipated d/c date is: > 3 days              Patient currently is not medically stable to d/c.   Consultants: Nephrology  Procedures: None  Antimicrobials: Rocephin and vancomycin from 05/01/2020 onwards   Subjective: Patient seen and examined at bedside.  She feels slightly better.  No overnight fever, worsening shortness of breath or foot pain reported. Objective: Vitals:   05/02/20 1918 05/02/20 2000 05/03/20 0023 05/03/20 0311  BP: (!) 123/63  (!) 116/63 (!) 132/67  Pulse: 96 95 95 (!) 108  Resp: 17 17 15 18   Temp: 97.6 F (36.4 C)  98.1 F (36.7 C) 99.3 F (37.4 C)  TempSrc: Oral  Oral Oral  SpO2: 100%  100% 100%  Weight:      Height:  Intake/Output Summary (Last 24 hours) at 05/03/2020 0735 Last data filed at 05/03/2020 0300 Gross per 24 hour  Intake 480 ml  Output 1 ml  Net 479 ml   Filed Weights   05/01/20 1444 05/01/20 2345  Weight: 78.5 kg 70.1 kg    Examination:  General exam: No acute distress.  Looks older than stated age.  Looks chronically ill Respiratory  system: Bilateral decreased breath sounds at bases with some scattered crackles Cardiovascular system: Rate controlled, S1-S2 heard Gastrointestinal system: Abdomen is nondistended, soft and nontender.  Bowel sounds are  extremities: No cyanosis, clubbing; trace lower limb edema Central nervous system: Awake and alert.  Still slow to respond to questions.  Poor historian.  No focal neurological deficits. Moving extremities Skin: Right foot dressing present. Right antecubital fossa fistula present.  No obvious other ecchymosis  psychiatry: Poor historian.  Has flat affect.    Data Reviewed: I have personally reviewed following labs and imaging studies  CBC: Recent Labs  Lab 05/01/20 1500 05/01/20 2015 05/02/20 0106 05/03/20 0131  WBC 24.1* 22.9* 23.6* 20.0*  NEUTROABS 21.9*  --   --  15.9*  HGB 8.5* 10.4* 8.7* 8.6*  HCT 27.4* 34.3* 27.8* 27.0*  MCV 103.8* 104.9* 101.5* 101.5*  PLT 337 352 323 353   Basic Metabolic Panel: Recent Labs  Lab 05/01/20 1500 05/02/20 0106 05/03/20 0131  NA 136 134* 134*  K 4.9 4.8 5.3*  CL 111 110 110  CO2 16* 16* 15*  GLUCOSE 85 100* 141*  BUN 51* 46* 47*  CREATININE 3.09* 2.81* 2.74*  CALCIUM 8.3* 8.4* 8.2*  MG  --   --  1.8   GFR: Estimated Creatinine Clearance: 23.1 mL/min (A) (by C-G formula based on SCr of 2.74 mg/dL (H)). Liver Function Tests: Recent Labs  Lab 05/01/20 1500 05/02/20 0106  AST 18 21  ALT 9 10  ALKPHOS 287* 301*  BILITOT 0.8 0.9  PROT 5.2* 5.3*  ALBUMIN 1.5* 1.5*   No results for input(s): LIPASE, AMYLASE in the last 168 hours. No results for input(s): AMMONIA in the last 168 hours. Coagulation Profile: Recent Labs  Lab 05/01/20 1500 05/02/20 0106  INR 2.7* 2.1*   Cardiac Enzymes: No results for input(s): CKTOTAL, CKMB, CKMBINDEX, TROPONINI in the last 168 hours. BNP (last 3 results) No results for input(s): PROBNP in the last 8760 hours. HbA1C: Recent Labs    05/02/20 0106  HGBA1C 4.1*    CBG: Recent Labs  Lab 05/02/20 0603 05/02/20 1123 05/02/20 1655 05/02/20 2105 05/03/20 0628  GLUCAP 82 111* 160* 158* 110*   Lipid Profile: No results for input(s): CHOL, HDL, LDLCALC, TRIG, CHOLHDL, LDLDIRECT in the last 72 hours. Thyroid Function Tests: No results for input(s): TSH, T4TOTAL, FREET4, T3FREE, THYROIDAB in the last 72 hours. Anemia Panel: No results for input(s): VITAMINB12, FOLATE, FERRITIN, TIBC, IRON, RETICCTPCT in the last 72 hours. Sepsis Labs: Recent Labs  Lab 05/01/20 1500 05/02/20 0106  LATICACIDVEN 1.0 1.3    Recent Results (from the past 240 hour(s))  Blood Culture (routine x 2)     Status: None (Preliminary result)   Collection Time: 05/01/20  3:03 PM   Specimen: BLOOD  Result Value Ref Range Status   Specimen Description BLOOD BLOOD RIGHT FOREARM  Final   Special Requests   Final    BOTTLES DRAWN AEROBIC AND ANAEROBIC Blood Culture results may not be optimal due to an inadequate volume of blood received in culture bottles   Culture   Final  NO GROWTH < 24 HOURS Performed at [facility] [address], [place], [place] [address]    Report Status PENDING  Incomplete  Blood Culture (routine x 2)     Status: None (Preliminary result)   Collection Time: 05/01/20  3:15 PM   Specimen: BLOOD LEFT ARM  Result Value Ref Range Status   Specimen Description BLOOD LEFT ARM  Final   Special Requests   Final    BOTTLES DRAWN AEROBIC AND ANAEROBIC Blood Culture results may not be optimal due to an inadequate volume of blood received in culture bottles   Culture   Final    NO GROWTH < 24 HOURS Performed at [facility], [place] [address], [place], [place] [address]    Report Status PENDING  Incomplete  SARS Coronavirus 2 by RT PCR (hospital order, performed in [facility] hospital lab) Nasopharyngeal Nasopharyngeal Swab     Status: None   Collection Time: 05/01/20  5:27 PM   Specimen: Nasopharyngeal Swab  Result Value Ref Range Status    SARS Coronavirus 2 NEGATIVE NEGATIVE Final    Comment: (NOTE) SARS-CoV-2 target nucleic acids are NOT DETECTED.  The SARS-CoV-2 RNA is generally detectable in upper and lower respiratory specimens during the acute phase of infection. The lowest concentration of SARS-CoV-2 viral copies this assay can detect is 250 copies / mL. A negative result does not preclude SARS-CoV-2 infection and should not be used as the sole basis for treatment or other patient management decisions.  A negative result may occur with improper specimen collection / handling, submission of specimen other than nasopharyngeal swab, presence of viral mutation(s) within the areas targeted by this assay, and inadequate number of viral copies (<250 copies / mL). A negative result must be combined with clinical observations, patient history, and epidemiological information.  Fact Sheet for Patients:   StrictlyIdeas.no  Fact Sheet for Healthcare Providers: BankingDealers.co.za  This test is not yet approved or  cleared by the [place] FDA and has been authorized for detection and/or diagnosis of SARS-CoV-2 by FDA under an Emergency Use Authorization (EUA).  This EUA will remain in effect (meaning this test can be used) for the duration of the COVID-19 declaration under Section 564(b)(1) of the Act, 21 U.S.C. section 360bbb-3(b)(1), unless the authorization is terminated or revoked sooner.  Performed at [facility], [place] [address] [place], [place] [address]   MRSA PCR Screening     Status: None   Collection Time: 05/01/20 11:38 PM   Specimen: Nasal Mucosa; Nasopharyngeal  Result Value Ref Range Status   MRSA by PCR NEGATIVE NEGATIVE Final    Comment:        The GeneXpert MRSA Assay (FDA approved for NASAL specimens only), is one component of a comprehensive MRSA colonization surveillance program. It is not intended to diagnose MRSA infection nor to  guide or monitor treatment for MRSA infections. Performed at [facility], [place] [address], [place],  [address]          Radiology Studies: DG Tibia/Fibula Right  Result Date: 05/01/2020 CLINICAL DATA:  Pain.  Fever.  Possible infection. EXAM: RIGHT TIBIA AND FIBULA - 2 VIEW COMPARISON:  None. FINDINGS: Cortical margins of the tibia and fibula are intact. No periosteal reaction or bony destruction. There is no evidence of fracture or other focal bone lesions. Arterial vascular calcifications. Scattered venous phleboliths. Minimal skin thickening/soft tissue prominence about the medial aspect of the mid leg. No soft  tissue air or radiopaque foreign body. IMPRESSION: 1. Minimal skin thickening/soft tissue prominence about the medial aspect of the mid leg is nonspecific. No soft tissue air or radiopaque foreign body. 2. No acute osseous abnormality. No radiographic evidence of osteomyelitis. Electronically Signed   By: Keith Rake M.D.   On: 05/01/2020 16:09   MR FOOT RIGHT WO CONTRAST  Result Date: 05/01/2020 CLINICAL DATA:  Infection fever, question of osteomyelitis EXAM: MRI OF THE RIGHT FOREFOOT WITHOUT CONTRAST TECHNIQUE: Multiplanar, multisequence MR imaging of the right was performed. No intravenous contrast was administered. COMPARISON:  None. FINDINGS: Bones/Joint/Cartilage There is mildly increased T2 hyperintense signal seen at the posterior calcaneus. No definite T1 hypointensity seen within this area. No definite area of cortical destruction or periosteal reaction. The remainder of the osseous structures are intact. There is a small ankle and subtalar joint effusion. Ligaments The Lisfranc ligaments are intact. Muscles and Tendons Mild diffuse fatty atrophy with increased signal seen with within the muscles surrounding the forefoot. The flexor and extensor tendons are intact. The Achilles tendon and plantar fascia are intact. Soft tissues Area superficial ulceration seen  over the posterior calcaneus. There is a tiny superficial fluid collection seen overlying the lateral aspect of the posterior calcaneus measuring 1.1 cm in transverse dimension. No sinus tract or loculated fluid collection. There is extensive heel pad inflammation. IMPRESSION: Findings of superficial ulceration with extensive surrounding cellulitis and a tiny superficial 1 cm phlegmon overlying the posterior calcaneus. No definite evidence of osteomyelitis. Extensive heel pad inflammation. Small ankle joint effusion. Findings suggestive of chronic denervation atrophy. Electronically Signed   By: Prudencio Pair M.D.   On: 05/01/2020 19:45   DG Chest Port 1 View  Result Date: 05/01/2020 CLINICAL DATA:  59 year old female with possible sepsis. EXAM: PORTABLE CHEST 1 VIEW COMPARISON:  Portable chest 02/04/2020 and earlier. FINDINGS: Portable AP semi upright view at 1446 hours. Stable mild elevation of the left hemidiaphragm. Lung volumes and mediastinal contours remain within normal limits. Continued indistinct left perihilar opacity since May. Allowing for portable technique the right lung remains clear. Negative trachea. No pneumothorax or pleural effusion. Negative visible bowel gas pattern. No acute osseous abnormality identified. IMPRESSION: 1. Persistent vague left perihilar lung opacity since May. Recommend either PA and lateral chest x-ray or follow-up Chest CT to further characterize. 2. No other cardiopulmonary abnormality. Electronically Signed   By: Genevie Ann M.D.   On: 05/01/2020 15:03   DG Foot Complete Left  Result Date: 05/01/2020 CLINICAL DATA:  Infection. Fever. Technologist notes state: Foot sores. EXAM: LEFT FOOT - COMPLETE 3+ VIEW COMPARISON:  None. FINDINGS: Mild hammertoe deformity of the digits. No fracture. No erosion, periosteal reaction, or evidence of bony destruction. Small Achilles tendon enthesophyte. Skin irregularity about the posterior heel. No radiopaque foreign body. There are  vascular calcifications. IMPRESSION: 1. Skin irregularity about the posterior heel, represent soft tissue ulcer. No radiographic findings of osteomyelitis. 2. Mild hammertoe deformity of the digits. Electronically Signed   By: Keith Rake M.D.   On: 05/01/2020 16:08   DG Foot Complete Right  Result Date: 05/01/2020 CLINICAL DATA:  Infection. Fever. Technologist notes state: Foot sores. EXAM: RIGHT FOOT COMPLETE - 3+ VIEW COMPARISON:  Radiograph 02/05/2020.  Foot MRI 02/06/2020. FINDINGS: Mild hammertoe deformity of the digits. Mild hallux valgus. No fracture. No periosteal reaction, bony destruction, or abnormal density to suggest osteomyelitis. There is skin irregularity involving the posterior aspect of the heel. There is a faint curvilinear 11 mm density  in the soft tissues that is seen only on the lateral view. Mild hindfoot osteoarthritis. There are vascular calcifications. IMPRESSION: 1. Soft tissue irregularity involving the posterior aspect of the heel, suspicious for ulcer. Faint curvilinear 11 mm density in the soft tissues may represent a foreign body. This is also seen on prior exam, however head no MRI correlate. 2. No radiographic findings of osteomyelitis. 3. Mild hammertoe deformity of the digits and hallux valgus. No acute osseous abnormality. Electronically Signed   By: Keith Rake M.D.   On: 05/01/2020 16:04        Scheduled Meds: . (feeding supplement) PROSource Plus  30 mL Oral BID BM  . amiodarone  200 mg Oral Daily  . feeding supplement (ENSURE ENLIVE)  237 mL Oral TID BM  . folic acid  1 mg Oral Daily  . heparin  5,000 Units Subcutaneous Q8H  . insulin aspart  0-5 Units Subcutaneous QHS  . insulin aspart  0-6 Units Subcutaneous TID WC  . multivitamin with minerals  1 tablet Oral Daily  . nortriptyline  25 mg Oral QHS  . pantoprazole  40 mg Oral Daily  . predniSONE  5 mg Oral Q breakfast  . tacrolimus  1.5 mg Oral Q12H  . thiamine  100 mg Oral Daily    Continuous Infusions: . cefTRIAXone (ROCEPHIN)  IV 2 g (05/02/20 1607)  . vancomycin 750 mg (05/02/20 1721)          Aline August, MD [facility] 05/03/2020, 7:35 AM

## 2020-05-04 LAB — CBC WITH DIFFERENTIAL/PLATELET
Abs Immature Granulocytes: 0.83 10*3/uL — ABNORMAL HIGH (ref 0.00–0.07)
Basophils Absolute: 0 10*3/uL (ref 0.0–0.1)
Basophils Relative: 0 %
Eosinophils Absolute: 0 10*3/uL (ref 0.0–0.5)
Eosinophils Relative: 0 %
HCT: 27.8 % — ABNORMAL LOW (ref 36.0–46.0)
Hemoglobin: 8.7 g/dL — ABNORMAL LOW (ref 12.0–15.0)
Immature Granulocytes: 4 %
Lymphocytes Relative: 9 %
Lymphs Abs: 1.8 10*3/uL (ref 0.7–4.0)
MCH: 31.3 pg (ref 26.0–34.0)
MCHC: 31.3 g/dL (ref 30.0–36.0)
MCV: 100 fL (ref 80.0–100.0)
Monocytes Absolute: 0.8 10*3/uL (ref 0.1–1.0)
Monocytes Relative: 4 %
Neutro Abs: 15.3 10*3/uL — ABNORMAL HIGH (ref 1.7–7.7)
Neutrophils Relative %: 83 %
Platelets: 261 10*3/uL (ref 150–400)
RBC: 2.78 MIL/uL — ABNORMAL LOW (ref 3.87–5.11)
RDW: 16 % — ABNORMAL HIGH (ref 11.5–15.5)
WBC: 18.8 10*3/uL — ABNORMAL HIGH (ref 4.0–10.5)
nRBC: 0 % (ref 0.0–0.2)

## 2020-05-04 LAB — BASIC METABOLIC PANEL
Anion gap: 8 (ref 5–15)
BUN: 48 mg/dL — ABNORMAL HIGH (ref 6–20)
CO2: 15 mmol/L — ABNORMAL LOW (ref 22–32)
Calcium: 8.5 mg/dL — ABNORMAL LOW (ref 8.9–10.3)
Chloride: 112 mmol/L — ABNORMAL HIGH (ref 98–111)
Creatinine, Ser: 2.93 mg/dL — ABNORMAL HIGH (ref 0.44–1.00)
GFR calc Af Amer: 19 mL/min — ABNORMAL LOW (ref >60)
GFR calc non Af Amer: 17 mL/min — ABNORMAL LOW (ref >60)
Glucose, Bld: 133 mg/dL — ABNORMAL HIGH (ref 70–99)
Potassium: 4.9 mmol/L (ref 3.5–5.1)
Sodium: 135 mmol/L (ref 135–145)

## 2020-05-04 LAB — C-REACTIVE PROTEIN: CRP: 18.7 mg/dL — ABNORMAL HIGH (ref <1.0)

## 2020-05-04 LAB — GLUCOSE, CAPILLARY
Glucose-Capillary: 103 mg/dL — ABNORMAL HIGH (ref 70–99)
Glucose-Capillary: 128 mg/dL — ABNORMAL HIGH (ref 70–99)
Glucose-Capillary: 172 mg/dL — ABNORMAL HIGH (ref 70–99)
Glucose-Capillary: 163 mg/dL — ABNORMAL HIGH (ref 70–99)

## 2020-05-04 LAB — MAGNESIUM: Magnesium: 1.8 mg/dL (ref 1.7–2.4)

## 2020-05-04 MED ORDER — MIDODRINE HCL 5 MG PO TABS
5.0000 mg | ORAL_TABLET | Freq: Three times a day (TID) | ORAL | Status: DC
  Administered 2020-05-04: 5 mg via ORAL
  Filled 2020-05-04: qty 1

## 2020-05-04 MED ORDER — DARBEPOETIN ALFA 150 MCG/0.3ML IJ SOSY
150.0000 ug | PREFILLED_SYRINGE | INTRAMUSCULAR | Status: DC
  Administered 2020-05-11 – 2020-05-25 (×3): 150 ug via SUBCUTANEOUS
  Filled 2020-05-04 (×3): qty 0.3

## 2020-05-04 MED ORDER — MIDODRINE HCL 5 MG PO TABS
10.0000 mg | ORAL_TABLET | Freq: Three times a day (TID) | ORAL | Status: DC
  Administered 2020-05-04 – 2020-05-05 (×2): 10 mg via ORAL
  Filled 2020-05-04 (×3): qty 2

## 2020-05-04 MED ORDER — MIDODRINE HCL 5 MG PO TABS
5.0000 mg | ORAL_TABLET | Freq: Once | ORAL | Status: AC
  Administered 2020-05-04: 5 mg via ORAL
  Filled 2020-05-04: qty 1

## 2020-05-04 MED ORDER — ALBUMIN HUMAN 25 % IV SOLN
25.0000 g | Freq: Once | INTRAVENOUS | Status: AC
  Administered 2020-05-04: 25 g via INTRAVENOUS
  Filled 2020-05-04: qty 100

## 2020-05-04 MED ORDER — SODIUM CHLORIDE 0.9 % IV BOLUS
1000.0000 mL | Freq: Once | INTRAVENOUS | Status: AC
  Administered 2020-05-04: 1000 mL via INTRAVENOUS

## 2020-05-04 MED ORDER — SODIUM BICARBONATE 650 MG PO TABS
650.0000 mg | ORAL_TABLET | Freq: Two times a day (BID) | ORAL | Status: DC
  Administered 2020-05-04 – 2020-05-05 (×4): 650 mg via ORAL
  Filled 2020-05-04 (×4): qty 1

## 2020-05-04 MED ORDER — SODIUM CHLORIDE 0.9 % IV SOLN: INTRAVENOUS | Status: DC

## 2020-05-04 NOTE — Progress Notes (Signed)
Patient ID: Wanda [name], female   DOB: 06-17-1961, 59 y.o.   MRN: [id number]  PROGRESS NOTE    Wanda [name]  GHW:[id number] DOB: 1960/11/03 DOA: 05/01/2020 PCP: Lorene Dy, MD   Brief Narrative:   59 y.o. female with medical history significant of diabetes type 2, hypertension, paroxysmal A. fib on Eliquis, hyperlipidemia, ESRD with failed kidney transplant in 2013 and repeat transplant in September 2020 at [facility] with current chronic renal disease stage IV, anemia of chronic disease, history of gastric sleeve surgery, hypoalbuminemia, right foot ulcer, chronic neuropathy presented with worsening right foot ulcer drainage getting worse over the last [redacted] weeks along with fever for 2 days.  On presentation, she was febrile with leukocytosis; x-ray of the right foot did not show any evidence of osteomyelitis.  She was started on IV fluids and antibiotics.  Assessment & Plan:   Sepsis: Present on admission Right foot ulcer infection with surrounding cellulitis, rule out osteomyelitis Leukocytosis -Patient presented with worsening right foot ulcer drainage over the last [redacted] weeks along with fever for the last couple of days.  Right foot x-ray does not show evidence of osteomyelitis. -Continue Rocephin and vancomycin.    Cultures negative so far. -MRI of the right foot without contrast did not show any evidence of osteomyelitis but showed superficial phlegmon overlying the posterior calcaneus. -Wound care evaluation appreciated.   -Communicated with Dr. Alvan Dame on 05/03/2020 regarding orthopedic evaluation and will wait for orthopedic consultation. -Recent lower extremity arterial duplex did not show any significant stenosis involving the visualized arteries of bilateral lower extremities -CRP 18.8 and white cell count is 18.8 this morning -Overnight, blood pressure has been on the lower side and patient received normal saline bolus.  Blood pressure was still in the 80s early this morning and  patient was sleeping.  Will give gentle hydration normal saline at 100 cc an hour.  Might need to start midodrine if blood pressure does not improve.  Chronic kidney disease stage IV Metabolic acidosis History of ESRD status post renal transplant on chronic immunosuppressive medications -Baseline creatinine around 2.90-3.  Creatinine on presentation was 3.09.  Creatinine 2.93 this morning.  Bicarb is still at 15. - Monitor creatinine.  Strict input and output.  Avoid nephrotoxic medications. -Continue home dosing of prednisone along with tacrolimus and Valtrex.  Bactrim on hold. -Nephrology following  Paroxysmal A. Fib -Held Eliquis in case patient undergoes surgical intervention. -Continue amiodarone.  Currently rate controlled  Anemia of chronic disease -No signs of bleeding.  Monitor.  Cachexia Hypoalbuminemia -Nutrition following.  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  CBGs with SSI.  Carb modified diet.    DVT prophylaxis: Heparin Code Status: Full Family Communication: None at bedside Disposition Plan: Status is: Inpatient  Remains inpatient appropriate because:Inpatient level of care appropriate due to severity of illness   Dispo: The patient is from: Home              Anticipated d/c is to: Home              Anticipated d/c date is: 2 days              Patient currently is not medically stable to d/c.   Consultants: Nephrology  Procedures: None  Antimicrobials: Rocephin and vancomycin from 05/01/2020 onwards   Subjective: Patient seen and examined at bedside.  Denies worsening shortness of breath, foot pain, fever or vomiting.  Still feels weak and tired.  Oral appetite  is very poor. Objective: Vitals:   05/04/20 0040 05/04/20 0403 05/04/20 0509 05/04/20 0653  BP: (!) 117/63 (!) 91/25 (!) 92/35 (!) 142/106  Pulse: 95 90    Resp: 19 18 15  (!) 0  Temp: 98.3 F (36.8 C) 98.5 F (36.9 C)    TempSrc:  Oral      SpO2:  100%    Weight:      Height:        Intake/Output Summary (Last 24 hours) at 05/04/2020 0712 Last data filed at 05/04/2020 0440 Gross per 24 hour  Intake 120 ml  Output --  Net 120 ml   Filed Weights   05/01/20 1444 05/01/20 2345  Weight: 78.5 kg 70.1 kg    Examination:  General exam: No distress.  Looks older than stated age.  Looks chronically ill Respiratory system: Bilateral decreased breath sounds at bases with scattered crackles, no wheezing  cardiovascular system: S1-S2 heard, rate controlled Gastrointestinal system: Abdomen is nondistended, soft and nontender.  Normal bowel sounds are heard  extremities: Trace lower extremity edema.  No clubbing  Central nervous system: Alert and awake.  Slow to respond to questions.  Poor historian.  No focal neurological deficits. Moving extremities Skin: Right foot dressing present. Right antecubital fossa fistula present.  No obvious other lesions/petechiae  psychiatry: Poor historian.  Flat affect.    Data Reviewed: I have personally reviewed following labs and imaging studies  CBC: Recent Labs  Lab 05/01/20 1500 05/01/20 2015 05/02/20 0106 05/03/20 0131 05/04/20 0154  WBC 24.1* 22.9* 23.6* 20.0* 18.8*  NEUTROABS 21.9*  --   --  15.9* 15.3*  HGB 8.5* 10.4* 8.7* 8.6* 8.7*  HCT 27.4* 34.3* 27.8* 27.0* 27.8*  MCV 103.8* 104.9* 101.5* 101.5* 100.0  PLT 337 352 323 304 161   Basic Metabolic Panel: Recent Labs  Lab 05/01/20 1500 05/02/20 0106 05/03/20 0131 05/04/20 0154  NA 136 134* 134* 135  K 4.9 4.8 5.3* 4.9  CL 111 110 110 112*  CO2 16* 16* 15* 15*  GLUCOSE 85 100* 141* 133*  BUN 51* 46* 47* 48*  CREATININE 3.09* 2.81* 2.74* 2.93*  CALCIUM 8.3* 8.4* 8.2* 8.5*  MG  --   --  1.8 1.8   GFR: Estimated Creatinine Clearance: 21.6 mL/min (A) (by C-G formula based on SCr of 2.93 mg/dL (H)). Liver Function Tests: Recent Labs  Lab 05/01/20 1500 05/02/20 0106  AST 18 21  ALT 9 10  ALKPHOS 287* 301*   BILITOT 0.8 0.9  PROT 5.2* 5.3*  ALBUMIN 1.5* 1.5*   No results for input(s): LIPASE, AMYLASE in the last 168 hours. No results for input(s): AMMONIA in the last 168 hours. Coagulation Profile: Recent Labs  Lab 05/01/20 1500 05/02/20 0106  INR 2.7* 2.1*   Cardiac Enzymes: No results for input(s): CKTOTAL, CKMB, CKMBINDEX, TROPONINI in the last 168 hours. BNP (last 3 results) No results for input(s): PROBNP in the last 8760 hours. HbA1C: Recent Labs    05/02/20 0106  HGBA1C 4.1*   CBG: Recent Labs  Lab 05/03/20 0628 05/03/20 1142 05/03/20 1542 05/03/20 2120 05/04/20 0643  GLUCAP 110* 121* 151* 112* 103*   Lipid Profile: No results for input(s): CHOL, HDL, LDLCALC, TRIG, CHOLHDL, LDLDIRECT in the last 72 hours. Thyroid Function Tests: No results for input(s): TSH, T4TOTAL, FREET4, T3FREE, THYROIDAB in the last 72 hours. Anemia Panel: No results for input(s): VITAMINB12, FOLATE, FERRITIN, TIBC, IRON, RETICCTPCT in the last 72 hours. Sepsis Labs: Recent Labs  Lab 05/01/20  1500 05/02/20 0106  LATICACIDVEN 1.0 1.3    Recent Results (from the past 240 hour(s))  Blood Culture (routine x 2)     Status: None (Preliminary result)   Collection Time: 05/01/20  3:03 PM   Specimen: BLOOD  Result Value Ref Range Status   Specimen Description BLOOD BLOOD RIGHT FOREARM  Final   Special Requests   Final    BOTTLES DRAWN AEROBIC AND ANAEROBIC Blood Culture results may not be optimal due to an inadequate volume of blood received in culture bottles   Culture   Final    NO GROWTH 2 DAYS Performed at [facility], [place] [address], [place], [place] [address]    Report Status PENDING  Incomplete  Blood Culture (routine x 2)     Status: None (Preliminary result)   Collection Time: 05/01/20  3:15 PM   Specimen: BLOOD LEFT ARM  Result Value Ref Range Status   Specimen Description BLOOD LEFT ARM  Final   Special Requests   Final    BOTTLES DRAWN AEROBIC AND ANAEROBIC Blood  Culture results may not be optimal due to an inadequate volume of blood received in culture bottles   Culture   Final    NO GROWTH 2 DAYS Performed at [facility], [place] [address] [place], [place] [address]    Report Status PENDING  Incomplete  SARS Coronavirus 2 by RT PCR (hospital order, performed in [facility] hospital lab) Nasopharyngeal Nasopharyngeal Swab     Status: None   Collection Time: 05/01/20  5:27 PM   Specimen: Nasopharyngeal Swab  Result Value Ref Range Status   SARS Coronavirus 2 NEGATIVE NEGATIVE Final    Comment: (NOTE) SARS-CoV-2 target nucleic acids are NOT DETECTED.  The SARS-CoV-2 RNA is generally detectable in upper and lower respiratory specimens during the acute phase of infection. The lowest concentration of SARS-CoV-2 viral copies this assay can detect is 250 copies / mL. A negative result does not preclude SARS-CoV-2 infection and should not be used as the sole basis for treatment or other patient management decisions.  A negative result may occur with improper specimen collection / handling, submission of specimen other than nasopharyngeal swab, presence of viral mutation(s) within the areas targeted by this assay, and inadequate number of viral copies (<250 copies / mL). A negative result must be combined with clinical observations, patient history, and epidemiological information.  Fact Sheet for Patients:   StrictlyIdeas.no  Fact Sheet for Healthcare Providers: BankingDealers.co.za  This test is not yet approved or  cleared by the [place] FDA and has been authorized for detection and/or diagnosis of SARS-CoV-2 by FDA under an Emergency Use Authorization (EUA).  This EUA will remain in effect (meaning this test can be used) for the duration of the COVID-19 declaration under Section 564(b)(1) of the Act, 21 U.S.C. section 360bbb-3(b)(1), unless the authorization is terminated or revoked  sooner.  Performed at [facility], [place] [address], [place], [place] [address]   Urine culture     Status: Abnormal   Collection Time: 05/01/20  8:11 PM   Specimen: In/Out Cath Urine  Result Value Ref Range Status   Specimen Description IN/OUT CATH URINE  Final   Special Requests   Final    NONE Performed at [facility], [place] [address] [place], [place] [address]    Culture MULTIPLE SPECIES PRESENT, SUGGEST RECOLLECTION (A)  Final   Report Status 05/03/2020 FINAL  Final  MRSA PCR  Screening     Status: None   Collection Time: 05/01/20 11:38 PM   Specimen: Nasal Mucosa; Nasopharyngeal  Result Value Ref Range Status   MRSA by PCR NEGATIVE NEGATIVE Final    Comment:        The GeneXpert MRSA Assay (FDA approved for NASAL specimens only), is one component of a comprehensive MRSA colonization surveillance program. It is not intended to diagnose MRSA infection nor to guide or monitor treatment for MRSA infections. Performed at [facility], [place] [address], [place], [place] [address]          Radiology Studies: No results found.      Scheduled Meds: . (feeding supplement) PROSource Plus  30 mL Oral BID BM  . amiodarone  200 mg Oral Daily  . darbepoetin (ARANESP) injection - NON-DIALYSIS  150 mcg Subcutaneous Q Mon-1800  . feeding supplement (ENSURE ENLIVE)  237 mL Oral TID BM  . folic acid  1 mg Oral Daily  . Gerhardt's butt cream   Topical QID  . heparin  5,000 Units Subcutaneous Q8H  . insulin aspart  0-5 Units Subcutaneous QHS  . insulin aspart  0-6 Units Subcutaneous TID WC  . multivitamin with minerals  1 tablet Oral Daily  . nortriptyline  25 mg Oral QHS  . pantoprazole  40 mg Oral Daily  . predniSONE  5 mg Oral Q breakfast  . sodium bicarbonate  650 mg Oral BID  . tacrolimus  1.5 mg Oral Q12H  . thiamine  100 mg Oral Daily   Continuous Infusions: . cefTRIAXone (ROCEPHIN)  IV 2 g (05/03/20 1842)  . vancomycin 750 mg (05/03/20 1726)           Aline August, MD [facility] 05/04/2020, 7:12 AM

## 2020-05-04 NOTE — Progress Notes (Signed)
Patient's BP 89/27(40). Manual BP taken 84/42. Patient asymptomatic. Lying in bed. Paged Dr. Marlowe Sax. Received order for 0.9% NS 1000 ml bolus. Patient's BP 92/35. Will continue to monitor and pass it on to day shift nurse.

## 2020-05-04 NOTE — Significant Event (Addendum)
Rapid Response Progress Note  Reason for Call : MEWS: T 98.30F, BP 71/33, HR 96, RR 15, 100% on room air, CBG 128 - pt awake alert. Order received for 1L IVF bolus. New orders placed, no assistance needed from RR RN at this time.   Initial Focused Assessment:  Pt lying in bed. Awake, alert. Follows commands. Skin is warm, dry. Pulses are palpable, 2+. Pt appears calm, not distressed. Pt being set up for lunch.   Interventions: -No intervention from RR RN  Plan of Care: -1L IVF bolus, trend vital signs -Reassess pt for level of consciousness and orientation -Trend I&O  Call rapid response for additional needs.   Addendum 05/04/2020 1610: -Pt BP following IVF bolus 91/45. BP at 1537 65/38. Pt remains asymptomatic. Skin is warm, dry. Lung sounds remain clear. RN received orders to give Midodrine, 1L IVF bolus, and 25g Albumin. RN to evaluate pt for urine output, bladder scan if no UOP.   Event Summary: Call Time: [place] Time: [place] [facility] [place] Time: [place], RN

## 2020-05-04 NOTE — Consult Note (Signed)
Reason for Consult: Right heel ulcer Referring Physician: Dr. [name] is an 59 y.o. female.  HPI: The patient is a 59 year old female with a past medical history significant for diabetes, hypoalbuminemia, atrial fibrillation on Eliquis and a renal transplant.  She complains of a blister on her right heel that started 2 weeks ago.  She reports that she stopped walking about a month ago due to foot pain.  She is scheduled to go to the wound center later this month.  She was admitted for sepsis.  I have reviewed wound consult notes.  She denies any significant pain in her feet at rest.  She is on antibiotics currently.  Specifically vancomycin and Rocephin.  On admission she had an elevated white count and CRP.  She has been off of Eliquis since admission.  She is on heparin currently.  Past Medical History:  Diagnosis Date  . Anemia   . Arthritis    "hands" (07/24/2013) pt denies  . Chronic kidney disease, stage IV (severe) ([place])    "never went on dialysis" (07/24/2013)  . Diabetes mellitus type II   . Diabetic peripheral neuropathy ([employer])   . Dialysis patient [facility])    Monday Wednesday Friday   . Dyspnea on exertion    pt denies  . Foot pain   . GERD (gastroesophageal reflux disease)   . Gout   . History of chicken pox   . History of measles   . History of mumps   . Hyperlipidemia   . Hypothyroidism   . Migraines    "q 3 months" (07/24/2013)  . Nonspecific abnormal unspecified cardiovascular function study   . Obesity   . PAD (peripheral artery disease) ([place])   . Peripheral neuropathy   . Peripheral vascular disease ([place])   . Pneumonia 09/2012  . Renal transplant disorder   . Shingles   . Sleep apnea    "getting ready to get tested again cause dr says I have it" (07/24/2013) pt states was tested and was told she did not have sleep apnea / no cpap  . Unspecified essential hypertension     Past Surgical History:  Procedure Laterality Date  . AV FISTULA  PLACEMENT Right 2000   "lower arm" (07/24/2013)  . AV FISTULA PLACEMENT, RADIOCEPHALIC Right 6546   "upper arm" (07/24/2013)  . CAPD REMOVAL N/A 10/10/2016   Procedure: CONTINUOUS AMBULATORY PERITONEAL DIALYSIS  (CAPD) CATHETER REMOVAL;  Surgeon: Donnie Mesa, MD;  Location: [place];  Service: General;  Laterality: N/A;  . CATARACT EXTRACTION W/ INTRAOCULAR LENS  IMPLANT, BILATERAL Bilateral 2011  . [place]; 1987  . CHOLECYSTECTOMY  2011  . COLONOSCOPY    . ENDOMETRIAL CRYOABLATION  1980's  . INTRAUTERINE DEVICE (IUD) INSERTION  2011  . LAPAROSCOPIC GASTRIC SLEEVE RESECTION N/A 05/26/2015   Procedure: LAPAROSCOPIC GASTRIC SLEEVE RESECTION;  Surgeon: Excell Seltzer, MD;  Location: [facility];  Service: General;  Laterality: N/A;  . TRANSPLANTATION RENAL  2013   "right" (09/04/2012)  . TUBAL LIGATION  1987  . [place]   "RLE; stripped" (09/04/2012)  . VIDEO BRONCHOSCOPY  09/07/2012   Procedure: VIDEO BRONCHOSCOPY WITHOUT FLUORO;  Surgeon: Chesley Mires, MD;  Location: [facility] ENDOSCOPY;  Service: Cardiopulmonary;  Laterality: Bilateral;    Family History  Problem Relation Age of Onset  . Colon cancer Father        deceased at age 28  . Diabetes Brother   . Neuropathy Neg Hx  Social History:  reports that she has never smoked. She has never used smokeless tobacco. She reports that she does not drink alcohol and does not use drugs.  Allergies:  Allergies  Allergen Reactions  . Propoxyphene N-Acetaminophen Other (See Comments)    Severe headache  . Hydrocodone-Acetaminophen Other (See Comments)     Severe headache   . Astemizole Rash and Other (See Comments)    headache   . Cephalosporins Rash    Medications: I have reviewed the patient's current medications.  Results for orders placed or performed during the hospital encounter of 05/01/20 (from the past 48 hour(s))  Glucose, capillary     Status: Abnormal   Collection Time: 05/02/20  4:55 PM   Result Value Ref Range   Glucose-Capillary 160 (H) 70 - 99 mg/dL    Comment: Glucose reference range applies only to samples taken after fasting for at least 8 hours.  Glucose, capillary     Status: Abnormal   Collection Time: 05/02/20  9:05 PM  Result Value Ref Range   Glucose-Capillary 158 (H) 70 - 99 mg/dL    Comment: Glucose reference range applies only to samples taken after fasting for at least 8 hours.  CBC with Differential/Platelet     Status: Abnormal   Collection Time: 05/03/20  1:31 AM  Result Value Ref Range   WBC 20.0 (H) 4.0 - 10.5 K/uL   RBC 2.66 (L) 3.87 - 5.11 MIL/uL   Hemoglobin 8.6 (L) 12.0 - 15.0 g/dL   HCT 27.0 (L) 36 - 46 %   MCV 101.5 (H) 80.0 - 100.0 fL   MCH 32.3 26.0 - 34.0 pg   MCHC 31.9 30.0 - 36.0 g/dL   RDW 15.9 (H) 11.5 - 15.5 %   Platelets 304 150 - 400 K/uL   nRBC 0.0 0.0 - 0.2 %   Neutrophils Relative % 80 %   Neutro Abs 15.9 (H) 1.7 - 7.7 K/uL   Lymphocytes Relative 10 %   Lymphs Abs 1.9 0.7 - 4.0 K/uL   Monocytes Relative 5 %   Monocytes Absolute 1.0 0 - 1 K/uL   Eosinophils Relative 0 %   Eosinophils Absolute 0.0 0 - 0 K/uL   Basophils Relative 0 %   Basophils Absolute 0.0 0 - 0 K/uL   WBC Morphology DOHLE BODIES     Comment: VACUOLATED NEUTROPHILS   Immature Granulocytes 5 %   Abs Immature Granulocytes 1.07 (H) 0.00 - 0.07 K/uL    Comment: Performed at [facility], [address], [place], [place] [address]  Basic metabolic panel     Status: Abnormal   Collection Time: 05/03/20  1:31 AM  Result Value Ref Range   Sodium 134 (L) 135 - 145 mmol/L   Potassium 5.3 (H) 3.5 - 5.1 mmol/L   Chloride 110 98 - 111 mmol/L   CO2 15 (L) 22 - 32 mmol/L   Glucose, Bld 141 (H) 70 - 99 mg/dL    Comment: Glucose reference range applies only to samples taken after fasting for at least 8 hours.   BUN 47 (H) 6 - 20 mg/dL   Creatinine, Ser 2.74 (H) 0.44 - 1.00 mg/dL   Calcium 8.2 (L) 8.9 - 10.3 mg/dL   GFR calc non Af Amer 18 (L) >60 mL/min    GFR calc Af Amer 21 (L) >60 mL/min   Anion gap 9 5 - 15    Comment: Performed at [place],  [place] [address]  Magnesium     Status: None   Collection Time: 05/03/20  1:31 AM  Result Value Ref Range   Magnesium 1.8 1.7 - 2.4 mg/dL    Comment: Performed at [facility] [address], [place], [place] [address]  C-reactive protein     Status: Abnormal   Collection Time: 05/03/20  1:31 AM  Result Value Ref Range   CRP 19.9 (H) <1.0 mg/dL    Comment: Performed at [facility] [address] [place], [place] [address]  Glucose, capillary     Status: Abnormal   Collection Time: 05/03/20  6:28 AM  Result Value Ref Range   Glucose-Capillary 110 (H) 70 - 99 mg/dL    Comment: Glucose reference range applies only to samples taken after fasting for at least 8 hours.  Glucose, capillary     Status: Abnormal   Collection Time: 05/03/20 11:42 AM  Result Value Ref Range   Glucose-Capillary 121 (H) 70 - 99 mg/dL    Comment: Glucose reference range applies only to samples taken after fasting for at least 8 hours.  Glucose, capillary     Status: Abnormal   Collection Time: 05/03/20  3:42 PM  Result Value Ref Range   Glucose-Capillary 151 (H) 70 - 99 mg/dL    Comment: Glucose reference range applies only to samples taken after fasting for at least 8 hours.  Glucose, capillary     Status: Abnormal   Collection Time: 05/03/20  9:20 PM  Result Value Ref Range   Glucose-Capillary 112 (H) 70 - 99 mg/dL    Comment: Glucose reference range applies only to samples taken after fasting for at least 8 hours.  CBC with Differential/Platelet     Status: Abnormal   Collection Time: 05/04/20  1:54 AM  Result Value Ref Range   WBC 18.8 (H) 4.0 - 10.5 K/uL   RBC 2.78 (L) 3.87 - 5.11 MIL/uL   Hemoglobin 8.7 (L) 12.0 - 15.0 g/dL   HCT 27.8 (L) 36 - 46 %   MCV 100.0 80.0 - 100.0 fL   MCH 31.3 26.0 - 34.0 pg   MCHC 31.3 30.0 - 36.0 g/dL   RDW 16.0 (H) 11.5 - 15.5 %    Platelets 261 150 - 400 K/uL   nRBC 0.0 0.0 - 0.2 %   Neutrophils Relative % 83 %   Neutro Abs 15.3 (H) 1.7 - 7.7 K/uL   Lymphocytes Relative 9 %   Lymphs Abs 1.8 0.7 - 4.0 K/uL   Monocytes Relative 4 %   Monocytes Absolute 0.8 0 - 1 K/uL   Eosinophils Relative 0 %   Eosinophils Absolute 0.0 0 - 0 K/uL   Basophils Relative 0 %   Basophils Absolute 0.0 0 - 0 K/uL   Immature Granulocytes 4 %   Abs Immature Granulocytes 0.83 (H) 0.00 - 0.07 K/uL    Comment: Performed at [facility], [address] [place], [place] [address]  Basic metabolic panel     Status: Abnormal   Collection Time: 05/04/20  1:54 AM  Result Value Ref Range   Sodium 135 135 - 145 mmol/L   Potassium 4.9 3.5 - 5.1 mmol/L   Chloride 112 (H) 98 - 111 mmol/L   CO2 15 (L) 22 - 32 mmol/L   Glucose, Bld 133 (H) 70 - 99 mg/dL    Comment: Glucose reference range applies only to samples taken after fasting for at least 8 hours.  BUN 48 (H) 6 - 20 mg/dL   Creatinine, Ser 2.93 (H) 0.44 - 1.00 mg/dL   Calcium 8.5 (L) 8.9 - 10.3 mg/dL   GFR calc non Af Amer 17 (L) >60 mL/min   GFR calc Af Amer 19 (L) >60 mL/min   Anion gap 8 5 - 15    Comment: Performed at [facility] [address], [place], [place] [address]  Magnesium     Status: None   Collection Time: 05/04/20  1:54 AM  Result Value Ref Range   Magnesium 1.8 1.7 - 2.4 mg/dL    Comment: Performed at [facility] [address], [place], [place] [address]  C-reactive protein     Status: Abnormal   Collection Time: 05/04/20  1:54 AM  Result Value Ref Range   CRP 18.7 (H) <1.0 mg/dL    Comment: Performed at [facility] [address], [place], [place] [address]  Glucose, capillary     Status: Abnormal   Collection Time: 05/04/20  6:43 AM  Result Value Ref Range   Glucose-Capillary 103 (H) 70 - 99 mg/dL    Comment: Glucose reference range applies only to samples taken after fasting for at least 8 hours.  Glucose, capillary     Status:  Abnormal   Collection Time: 05/04/20 11:03 AM  Result Value Ref Range   Glucose-Capillary 128 (H) 70 - 99 mg/dL    Comment: Glucose reference range applies only to samples taken after fasting for at least 8 hours.   Comment 1 Notify RN    Comment 2 Document in Chart     No results found.  ROS: Positive fever and chills.  No nausea or vomiting.  10 system review is otherwise negative. PE:  Blood pressure (!) 84/45, pulse 96, temperature 98.8 F (37.1 C), temperature source Axillary, resp. rate 15, height 5\' 9"  (1.753 m), weight 70.1 kg, SpO2 100 %. Cachectic chronically ill-appearing woman in no apparent distress.  Alert and oriented.  Extraocular motions are intact.  Respirations are unlabored.  She has bilateral plantar flexion contractures of her ankles.  No lymphadenopathy is noted at the level of the ankles.  No lymphangitis.  On the right she has a large necrotic ulcer measuring approximately 7 cm x 7 cm.  There is foul-smelling drainage coming from the ulcer on the right.  She can feel light touch at her foot dorsally and plantarly.  Active plantar flexion dorsiflexion strength is evident at the toes.  The left heel has a superficial pressure ulcer with intact skin and no evidence of infection or drainage.  2+ dorsalis pedis pulse on the right.  MRI of the right foot is reviewed and shows no osteomyelitis of the calcaneus but the large ulcer is evident with no significant abscess.  Assessment/Plan: Based on the patient's exam and history she has a large diabetic ulcer at the posterior and plantar aspect of her right heel.  There is a stable eschar in place but peripheral necrosis with copious drainage.  While there is no osteomyelitis evident at this point I believe there is superficial infection.  I do not believe there is an indication for surgical treatment at this point.  Essentially surgical treatment would require debridement of the necrotic soft tissue which would exposed bone that  could not be covered realistically.  Any surgical treatment at this point would be a below knee amputation.  I believe it is reasonable to observe for short time longer  on antibiotics to see if this makes any difference in the appearance of the wound.  I counseled the patient that she is at substantial risk for amputation.  I will see her again tomorrow.  She should remain on short acting blood thinners pending surgical treatment as needed.  Wanda [name] 05/04/2020, 12:15 PM

## 2020-05-04 NOTE — Progress Notes (Signed)
Pharmacy Antibiotic Note  Wanda [name] is a 59 y.o. female admitted on 05/01/2020 with sepsis.  Pharmacy has been consulted for Vancomycin dosing.  On day#4 of antibiotics. Scr remains around 2.9 (CrCl 21 mL/hr). CRP down slightly to 18.7. WBC down from 22 to 18 (on steroids). MRI on 7/30 showing superficial ulceration with surrounding cellulitis - no definite evidence of osteomyelitis. Plan for ortho to evaluate.   Plan: Vancomycin 750mg  IV Q24h (nomogram dosing) Monitor renal function, CBC, and s/sx of clinical improvement  Height: 5\' 9"  (175.3 cm) Weight: 70.1 kg (154 lb 8.7 oz) IBW/kg (Calculated) : 66.2  Temp (24hrs), Avg:98.4 F (36.9 C), Min:98 F (36.7 C), Max:98.8 F (37.1 C)  Recent Labs  Lab 05/01/20 1500 05/01/20 2015 05/02/20 0106 05/03/20 0131 05/04/20 0154  WBC 24.1* 22.9* 23.6* 20.0* 18.8*  CREATININE 3.09*  --  2.81* 2.74* 2.93*  LATICACIDVEN 1.0  --  1.3  --   --     Estimated Creatinine Clearance: 21.6 mL/min (A) (by C-G formula based on SCr of 2.93 mg/dL (H)).    Allergies  Allergen Reactions  . Propoxyphene N-Acetaminophen Other (See Comments)    Severe headache  . Hydrocodone-Acetaminophen Other (See Comments)     Severe headache   . Astemizole Rash and Other (See Comments)    headache   . Cephalosporins Rash    Antimicrobials this admission: Vancomycin 7/30 >>  Ceftriaxone 7/30 >>   Dose adjustments this admission: N/A  Microbiology results: 7/30 BCx: ngtd 7/30 UCx: multiple species >> suggest recollection   Thank you for allowing pharmacy to be a part of this patient's care.  Antonietta Jewel, PharmD, [place] Clinical Pharmacist  Phone: [phone] 05/04/2020 10:48 AM  Please check AMION for all [place] phone numbers After 10:00 PM, call [place] [phone]

## 2020-05-04 NOTE — Progress Notes (Signed)
   05/04/20 1134  Assess: MEWS Score  BP (!) 71/33  Pulse Rate 96  ECG Heart Rate 96  Resp 15  Level of Consciousness Alert  Assess: MEWS Score  MEWS Temp 0  MEWS Systolic 2  MEWS Pulse 0  MEWS RR 0  MEWS LOC 0  MEWS Score 2  MEWS Score Color Yellow  Assess: if the MEWS score is Yellow or Red  Were vital signs taken at a resting state? Yes  Focused Assessment No change from prior assessment  Early Detection of Sepsis Score *See Row Information* Low  MEWS guidelines implemented *See Row Information* Yes  Treat  MEWS Interventions Administered scheduled meds/treatments  Notify: Charge Nurse/RN  Name of Charge Nurse/RN Notified [place]  Date Charge Nurse/RN Notified 05/04/20  Time Charge Nurse/RN Notified 1134  Notify: Provider  Provider Name/Title Dr Starla Link  Date Provider Notified 05/04/20  Time Provider Notified 1135  Notification Type Call  Notification Reason Other (Comment) (lower BP)  Response See new orders  Notify: Rapid Response  Name of Rapid Response RN Notified [facility] RN  Date Rapid Response Notified 05/04/20  Time Rapid Response Notified [id number]  Document  Progress note created (see row info) Yes

## 2020-05-04 NOTE — Progress Notes (Signed)
Patient ID: Wanda [name], female   DOB: September 11, 1961, 59 y.o.   MRN: [id number]   Consult received Patient will be seen today by Dr Doran Durand or myself Full note and plan to follow Thank you

## 2020-05-04 NOTE — Progress Notes (Signed)
Subjective:  BP soft- no UOP recorded - crt worsened from 2.7 to 2.9 but still at her baseline.  She has no c/o's this AM but is pretty flat   Objective Vital signs in last 24 hours: Vitals:   05/03/20 2307 05/04/20 0040 05/04/20 0403 05/04/20 0509  BP:  (!) 117/63 (!) 91/25 (!) 92/35  Pulse:  95 90   Resp: 15 19 18 15   Temp:  98.3 F (36.8 C) 98.5 F (36.9 C)   TempSrc:   Oral   SpO2:   100%   Weight:      Height:       Weight change:   Intake/Output Summary (Last 24 hours) at 05/04/2020 0645 Last data filed at 05/04/2020 0440 Gross per 24 hour  Intake 120 ml  Output --  Net 120 ml    Assessment/ Plan: Pt is a 59 y.o. yo female with DM, HTN, Afib, s/p renal transplant who was admitted on 05/01/2020 with worsening drainage from right foot ulcer  Assessment/Plan: 1. S/p renal transplant-  [facility] transplant at [facility] in September of 2020-  C/b delayed graft function.  Allograft function is not great with crt of around 3 of late- no acute change. 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  Some controversy regarding prograf dose- put on what we think it is and will check a level in AM 2. Foot ulcer drainage-  Imaging not showing overt osteo.  On rocephin and vanc-  Per primary team  3. Anemia- fairly advanced, likely due to CKD-  Will give ESA- no iron due to infection 4. HTN/volume- BP soft,  Has pitting edema- no BP meds  5. FTT-  Therapies in place,  Albumin of 1.5 gives poor prognosis - on prosource  6. Metabolic acidosis-  Start oral bicarb   [name]    Labs: Basic Metabolic Panel: Recent Labs  Lab 05/02/20 0106 05/03/20 0131 05/04/20 0154  NA 134* 134* 135  K 4.8 5.3* 4.9  CL 110 110 112*  CO2 16* 15* 15*  GLUCOSE 100* 141* 133*  BUN 46* 47* 48*  CREATININE 2.81* 2.74* 2.93*  CALCIUM 8.4* 8.2* 8.5*   Liver Function Tests: Recent Labs  Lab 05/01/20 1500 05/02/20 0106  AST 18 21  ALT 9 10  ALKPHOS 287* 301*  BILITOT 0.8  0.9  PROT 5.2* 5.3*  ALBUMIN 1.5* 1.5*   No results for input(s): LIPASE, AMYLASE in the last 168 hours. No results for input(s): AMMONIA in the last 168 hours. CBC: Recent Labs  Lab 05/01/20 1500 05/01/20 1500 05/01/20 2015 05/01/20 2015 05/02/20 0106 05/03/20 0131 05/04/20 0154  WBC 24.1*   < > 22.9*   < > 23.6* 20.0* 18.8*  NEUTROABS 21.9*  --   --   --   --  15.9* 15.3*  HGB 8.5*   < > 10.4*   < > 8.7* 8.6* 8.7*  HCT 27.4*   < > 34.3*   < > 27.8* 27.0* 27.8*  MCV 103.8*  --  104.9*  --  101.5* 101.5* 100.0  PLT 337   < > 352   < > 323 304 261   < > = values in this interval not displayed.   Cardiac Enzymes: No results for input(s): CKTOTAL, CKMB, CKMBINDEX, TROPONINI in the last 168 hours. CBG: Recent Labs  Lab 05/03/20 0628 05/03/20 1142 05/03/20 1542 05/03/20 2120 05/04/20 0643  GLUCAP 110* 121* 151* 112* 103*    Iron  Studies: No results for input(s): IRON, TIBC, TRANSFERRIN, FERRITIN in the last 72 hours. Studies/Results: No results found. Medications: Infusions: . cefTRIAXone (ROCEPHIN)  IV 2 g (05/03/20 1842)  . vancomycin 750 mg (05/03/20 1726)    Scheduled Medications: . (feeding supplement) PROSource Plus  30 mL Oral BID BM  . amiodarone  200 mg Oral Daily  . feeding supplement (ENSURE ENLIVE)  237 mL Oral TID BM  . folic acid  1 mg Oral Daily  . Gerhardt's butt cream   Topical QID  . heparin  5,000 Units Subcutaneous Q8H  . insulin aspart  0-5 Units Subcutaneous QHS  . insulin aspart  0-6 Units Subcutaneous TID WC  . multivitamin with minerals  1 tablet Oral Daily  . nortriptyline  25 mg Oral QHS  . pantoprazole  40 mg Oral Daily  . predniSONE  5 mg Oral Q breakfast  . tacrolimus  1.5 mg Oral Q12H  . thiamine  100 mg Oral Daily    have reviewed scheduled and prn medications.  Physical Exam: General: watching TV-  Flat, no c/o's  Heart: borderline tachy Lungs: mostly clear Abdomen: soft, non tender Extremities: pitting edema to dep  areas -  bilat boots and bandages     05/04/2020,6:45 AM  LOS: 3 days

## 2020-05-05 ENCOUNTER — Encounter (HOSPITAL_COMMUNITY): Payer: Medicare HMO

## 2020-05-05 ENCOUNTER — Inpatient Hospital Stay (HOSPITAL_COMMUNITY): Payer: Medicare HMO

## 2020-05-05 DIAGNOSIS — I959 Hypotension, unspecified: Secondary | ICD-10-CM

## 2020-05-05 DIAGNOSIS — A419 Sepsis, unspecified organism: Secondary | ICD-10-CM

## 2020-05-05 DIAGNOSIS — R6521 Severe sepsis with septic shock: Secondary | ICD-10-CM

## 2020-05-05 DIAGNOSIS — I313 Pericardial effusion (noninflammatory): Secondary | ICD-10-CM

## 2020-05-05 LAB — ECHOCARDIOGRAM COMPLETE
AR max vel: 2.04 cm2
AV Area VTI: 1.89 cm2
AV Area mean vel: 2.05 cm2
AV Mean grad: 4 mmHg
AV Peak grad: 8.6 mmHg
Ao pk vel: 1.47 m/s
Height: 69 in
S' Lateral: 2.3 cm
Weight: 2472.68 oz

## 2020-05-05 LAB — CBC WITH DIFFERENTIAL/PLATELET
Abs Immature Granulocytes: 0 10*3/uL (ref 0.00–0.07)
Band Neutrophils: 1 %
Basophils Absolute: 0 10*3/uL (ref 0.0–0.1)
Basophils Relative: 0 %
Eosinophils Absolute: 0 10*3/uL (ref 0.0–0.5)
Eosinophils Relative: 0 %
HCT: 27.4 % — ABNORMAL LOW (ref 36.0–46.0)
Hemoglobin: 8.5 g/dL — ABNORMAL LOW (ref 12.0–15.0)
Lymphocytes Relative: 7 %
Lymphs Abs: 1.4 10*3/uL (ref 0.7–4.0)
MCH: 31.8 pg (ref 26.0–34.0)
MCHC: 31 g/dL (ref 30.0–36.0)
MCV: 102.6 fL — ABNORMAL HIGH (ref 80.0–100.0)
Monocytes Absolute: 0.8 10*3/uL (ref 0.1–1.0)
Monocytes Relative: 4 %
Neutro Abs: 17.7 10*3/uL — ABNORMAL HIGH (ref 1.7–7.7)
Neutrophils Relative %: 88 %
Platelets: 257 10*3/uL (ref 150–400)
RBC: 2.67 MIL/uL — ABNORMAL LOW (ref 3.87–5.11)
RDW: 16.1 % — ABNORMAL HIGH (ref 11.5–15.5)
WBC: 19.9 10*3/uL — ABNORMAL HIGH (ref 4.0–10.5)
nRBC: 0 % (ref 0.0–0.2)
nRBC: 0 /100 WBC

## 2020-05-05 LAB — URINALYSIS, ROUTINE W REFLEX MICROSCOPIC
Bilirubin Urine: NEGATIVE
Glucose, UA: 50 mg/dL — AB
Ketones, ur: NEGATIVE mg/dL
Nitrite: NEGATIVE
Protein, ur: NEGATIVE mg/dL
RBC / HPF: >50 RBC/hpf — ABNORMAL HIGH (ref 0–5)
Specific Gravity, Urine: 1.012 (ref 1.005–1.030)
WBC, UA: >50 WBC/hpf — ABNORMAL HIGH (ref 0–5)
pH: 5 (ref 5.0–8.0)

## 2020-05-05 LAB — BASIC METABOLIC PANEL
Anion gap: 12 (ref 5–15)
BUN: 45 mg/dL — ABNORMAL HIGH (ref 6–20)
CO2: 14 mmol/L — ABNORMAL LOW (ref 22–32)
Calcium: 8.2 mg/dL — ABNORMAL LOW (ref 8.9–10.3)
Chloride: 110 mmol/L (ref 98–111)
Creatinine, Ser: 2.69 mg/dL — ABNORMAL HIGH (ref 0.44–1.00)
GFR calc Af Amer: 22 mL/min — ABNORMAL LOW (ref >60)
GFR calc non Af Amer: 19 mL/min — ABNORMAL LOW (ref >60)
Glucose, Bld: 160 mg/dL — ABNORMAL HIGH (ref 70–99)
Potassium: 5.1 mmol/L (ref 3.5–5.1)
Sodium: 136 mmol/L (ref 135–145)

## 2020-05-05 LAB — MAGNESIUM: Magnesium: 1.8 mg/dL (ref 1.7–2.4)

## 2020-05-05 LAB — LACTIC ACID, PLASMA: Lactic Acid, Venous: 0.9 mmol/L (ref 0.5–1.9)

## 2020-05-05 LAB — GLUCOSE, CAPILLARY
Glucose-Capillary: 139 mg/dL — ABNORMAL HIGH (ref 70–99)
Glucose-Capillary: 139 mg/dL — ABNORMAL HIGH (ref 70–99)
Glucose-Capillary: 153 mg/dL — ABNORMAL HIGH (ref 70–99)
Glucose-Capillary: 200 mg/dL — ABNORMAL HIGH (ref 70–99)
Glucose-Capillary: 199 mg/dL — ABNORMAL HIGH (ref 70–99)

## 2020-05-05 LAB — C-REACTIVE PROTEIN: CRP: 14.3 mg/dL — ABNORMAL HIGH (ref <1.0)

## 2020-05-05 MED ORDER — LIDOCAINE HCL 1 % IJ SOLN
5.0000 mL | Freq: Once | INTRAMUSCULAR | Status: DC
  Filled 2020-05-05 (×2): qty 5

## 2020-05-05 MED ORDER — METHYLPREDNISOLONE SODIUM SUCC 40 MG IJ SOLR
40.0000 mg | Freq: Four times a day (QID) | INTRAMUSCULAR | Status: DC
  Administered 2020-05-05: 40 mg via INTRAVENOUS
  Filled 2020-05-05: qty 1

## 2020-05-05 MED ORDER — PREDNISONE 5 MG PO TABS
5.0000 mg | ORAL_TABLET | Freq: Every day | ORAL | Status: DC
  Administered 2020-05-06 – 2020-06-01 (×27): 5 mg via ORAL
  Filled 2020-05-05 (×28): qty 1

## 2020-05-05 MED ORDER — CHLORHEXIDINE GLUCONATE CLOTH 2 % EX PADS
6.0000 | MEDICATED_PAD | Freq: Every day | CUTANEOUS | Status: DC
  Administered 2020-05-05 – 2020-06-01 (×19): 6 via TOPICAL

## 2020-05-05 MED ORDER — SODIUM CHLORIDE 0.9 % IV BOLUS
1000.0000 mL | Freq: Once | INTRAVENOUS | Status: AC
  Administered 2020-05-05: 1000 mL via INTRAVENOUS

## 2020-05-05 MED ORDER — HYDROCORTISONE NA SUCCINATE PF 100 MG IJ SOLR: 50.0000 mg | Freq: Four times a day (QID) | INTRAMUSCULAR | Status: DC

## 2020-05-05 MED ORDER — PIPERACILLIN-TAZOBACTAM 3.375 G IVPB
3.3750 g | Freq: Three times a day (TID) | INTRAVENOUS | Status: DC
  Administered 2020-05-05 – 2020-05-12 (×22): 3.375 g via INTRAVENOUS
  Filled 2020-05-05 (×22): qty 50

## 2020-05-05 NOTE — Progress Notes (Signed)
Subjective: Pt has been transferred to the ICU for concern of hypotension.  An A line now shows no hypotension.  She denies any pain in her foot.  I spoke with Dr. Tamala Julian of PCCM and discussed the need for R BKA.    Objective: Vital signs in last 24 hours: Temp:  [97.9 F (36.6 C)-99 F (37.2 C)] 97.9 F (36.6 C) (08/03 1118) Pulse Rate:  [84-102] 100 (08/03 1400) Resp:  [12-25] 15 (08/03 1400) BP: (50-122)/(25-60) 99/47 (08/03 0730) SpO2:  [99 %-100 %] 100 % (08/03 1400) Arterial Line BP: (159-174)/(59-71) 163/64 (08/03 1400)  Intake/Output from previous day: 08/02 0701 - 08/03 0700 In: 6887.4 [P.O.:887; I.V.:1000; IV Piggyback:5000.4] Out: -  Intake/Output this shift: Total I/O In: -  Out: 225 [Urine:225]  Recent Labs    05/03/20 0131 05/04/20 0154 05/05/20 0208  HGB 8.6* 8.7* 8.5*   Recent Labs    05/04/20 0154 05/05/20 0208  WBC 18.8* 19.9*  RBC 2.78* 2.67*  HCT 27.8* 27.4*  PLT 261 257   Recent Labs    05/04/20 0154 05/05/20 0208  NA 135 136  K 4.9 5.1  CL 112* 110  CO2 15* 14*  BUN 48* 45*  CREATININE 2.93* 2.69*  GLUCOSE 133* 160*  CALCIUM 8.5* 8.2*   No results for input(s): LABPT, INR in the last 72 hours.  PE:  cachetic, chronically ill woman in nad.  B LEs in PRAFO boots.  R LE with copious drainage and foul smell.  Large necrotic ulcer at the posterior calcaneus.    Assessment/Plan: R heel diabetic ulcer and gangrene - I spoke with the patient and told her the nature of the wound.  She is showing no improvement with local wound care and IV abx.  I don't believe its realistic for this wound to heal, and persistence of the chronically infected wound is contributing to her ongoing illness.  I believe it's medically necessary to consider R BKA for definitive treatment of the infected right lower extremity.  I spoke with her son [name] by phone and explained the nature of the situation in detail.  He and his Mother agree with the plan for amputation.   I'll schedule her for THurdsay and plan to meet the family tomorrow.     [name] 05/05/2020, 4:56 PM

## 2020-05-05 NOTE — Progress Notes (Signed)
Subjective:  BP soft-  Seems to be lower over the last 24 hours, started on midodrine and giving fluid- no UOP recorded, reportedly almost 7 liters positive- crt stable to improved but probably diluted as well.  She has no c/o's this AM but is pretty flat and somnolent  Objective Vital signs in last 24 hours: Vitals:   05/05/20 0402 05/05/20 0431 05/05/20 0503 05/05/20 0530  BP: (!) 90/34 (!) 50/40 122/60 (!) 95/41  Pulse: 87 93 92 90  Resp: 16 16 15 16   Temp: 98.3 F (36.8 C)     TempSrc: Oral     SpO2: 99%     Weight:      Height:       Weight change:   Intake/Output Summary (Last 24 hours) at 05/05/2020 0656 Last data filed at 05/05/2020 0500 Gross per 24 hour  Intake 6887.41 ml  Output --  Net 6887.41 ml    Assessment/ Plan: Pt is a 59 y.o. yo female with DM, HTN, Afib, s/p renal transplant who was admitted on 05/01/2020 with worsening drainage from right foot ulcer  Assessment/Plan: 1. S/p renal transplant-  Second transplant at [facility] in September of 2020-  C/b delayed graft function.  Allograft function is not great with crt of around 3 of late- no acute change. 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  Some controversy regarding prograf dose- put on what we think -  level pending 2. Foot ulcer drainage-  Imaging not showing overt osteo.  On rocephin and vanc-  Per primary team  3. Anemia- fairly advanced, likely due to CKD-   giving ESA- no iron due to infection 4. HTN/volume- hypotension,  Has pitting edema- no BP meds -- this issue has continued, started on midodrine and had received much volume in overnight-  Think we need to stop as she is reportedly almost 7 liters positive and third spacing-  Will stop IVF and try some inc (stress dose)steroids.  Consider checking sepsis parameters/aline/getting CCM involved for pressors if needed ?  5. FTT-  Therapies in place,  Albumin of 1.5 gives poor prognosis - on prosource  6. Metabolic acidosis-   oral  bicarb   [name]    Labs: Basic Metabolic Panel: Recent Labs  Lab 05/03/20 0131 05/04/20 0154 05/05/20 0208  NA 134* 135 136  K 5.3* 4.9 5.1  CL 110 112* 110  CO2 15* 15* 14*  GLUCOSE 141* 133* 160*  BUN 47* 48* 45*  CREATININE 2.74* 2.93* 2.69*  CALCIUM 8.2* 8.5* 8.2*   Liver Function Tests: Recent Labs  Lab 05/01/20 1500 05/02/20 0106  AST 18 21  ALT 9 10  ALKPHOS 287* 301*  BILITOT 0.8 0.9  PROT 5.2* 5.3*  ALBUMIN 1.5* 1.5*   No results for input(s): LIPASE, AMYLASE in the last 168 hours. No results for input(s): AMMONIA in the last 168 hours. CBC: Recent Labs  Lab 05/01/20 2015 05/01/20 2015 05/02/20 0106 05/02/20 0106 05/03/20 0131 05/04/20 0154 05/05/20 0208  WBC 22.9*   < > 23.6*   < > 20.0* 18.8* 19.9*  NEUTROABS  --   --   --   --  15.9* 15.3* 17.7*  HGB 10.4*   < > 8.7*   < > 8.6* 8.7* 8.5*  HCT 34.3*   < > 27.8*   < > 27.0* 27.8* 27.4*  MCV 104.9*  --  101.5*  --  101.5* 100.0 102.6*  PLT 352   < >  323   < > 304 261 257   < > = values in this interval not displayed.   Cardiac Enzymes: No results for input(s): CKTOTAL, CKMB, CKMBINDEX, TROPONINI in the last 168 hours. CBG: Recent Labs  Lab 05/04/20 0643 05/04/20 1103 05/04/20 1607 05/04/20 2057 05/05/20 0603  GLUCAP 103* 128* 172* 163* 139*    Iron Studies: No results for input(s): IRON, TIBC, TRANSFERRIN, FERRITIN in the last 72 hours. Studies/Results: No results found. Medications: Infusions: . sodium chloride 100 mL/hr at 05/04/20 2340  . cefTRIAXone (ROCEPHIN)  IV 2 g (05/04/20 1457)  . vancomycin 750 mg (05/04/20 1658)    Scheduled Medications: . (feeding supplement) PROSource Plus  30 mL Oral BID BM  . amiodarone  200 mg Oral Daily  . darbepoetin (ARANESP) injection - NON-DIALYSIS  150 mcg Subcutaneous Q Mon-1800  . feeding supplement (ENSURE ENLIVE)  237 mL Oral TID BM  . folic acid  1 mg Oral Daily  . Gerhardt's butt cream   Topical QID  . heparin   5,000 Units Subcutaneous Q8H  . insulin aspart  0-5 Units Subcutaneous QHS  . insulin aspart  0-6 Units Subcutaneous TID WC  . midodrine  10 mg Oral TID WC  . multivitamin with minerals  1 tablet Oral Daily  . nortriptyline  25 mg Oral QHS  . pantoprazole  40 mg Oral Daily  . predniSONE  5 mg Oral Q breakfast  . sodium bicarbonate  650 mg Oral BID  . tacrolimus  1.5 mg Oral Q12H  . thiamine  100 mg Oral Daily    have reviewed scheduled and prn medications.  Physical Exam: General: somnolent  Flat, no c/o's  Heart: RRR Lungs: mostly clear Abdomen: soft, non tender Extremities: pitting edema to dep areas -  bilat boots and bandages     05/05/2020,6:56 AM  LOS: 4 days

## 2020-05-05 NOTE — Progress Notes (Signed)
  Echocardiogram 2D Echocardiogram has been performed.  Wanda [name] 05/05/2020, 12:29 PM

## 2020-05-05 NOTE — Progress Notes (Signed)
Patient's BP 71/33 (42). Patient's BP running soft since last night (08/02). NS running at 100 ml/hr. MEWS has been switching back between yellow and green. No change in patient's mental status.Discussed with charge nurse. On call MD paged about patient's BP. Received order for 1L NS bolus. Will keep monitoring BP every 30 minutes.

## 2020-05-05 NOTE — Plan of Care (Signed)
  Problem: Clinical Measurements: Goal: Will remain free from infection Outcome: Not Progressing   Problem: Nutrition: Goal: Adequate nutrition will be maintained Outcome: Not Progressing  Poor oral intake

## 2020-05-05 NOTE — Progress Notes (Signed)
Updated MD about the patient's BP post 1L NS bolus. Patient's BP 89/45 (58). Received order for 1 L NS bolus. Started infusing. Patient awake alert and oriented. Will continue monitoring BP.

## 2020-05-05 NOTE — Progress Notes (Signed)
Pharmacy Antibiotic Note  Wanda [name] is a 59 y.o. female admitted on 05/01/2020 with right foot ulcer drainage and fever.  Pharmacy has been consulted to transition from vancomycin and ceftriaxone to Zosyn for cellulitis.    SCr down 2.69, CrCL 24 ml/min, afebrile, WBC up to 19.9, LA 0.9.  Plan: Zosyn EID 3.375gm IV Q8H Monitor renal fxn, clinical course  Height: 5\' 9"  (175.3 cm) Weight: 70.1 kg (154 lb 8.7 oz) IBW/kg (Calculated) : 66.2  Temp (24hrs), Avg:98.6 F (37 C), Min:97.9 F (36.6 C), Max:99 F (37.2 C)  Recent Labs  Lab 05/01/20 1500 05/01/20 1500 05/01/20 2015 05/02/20 0106 05/03/20 0131 05/04/20 0154 05/05/20 0208 05/05/20 0840  WBC 24.1*   < > 22.9* 23.6* 20.0* 18.8* 19.9*  --   CREATININE 3.09*  --   --  2.81* 2.74* 2.93* 2.69*  --   LATICACIDVEN 1.0  --   --  1.3  --   --   --  0.9   < > = values in this interval not displayed.    Estimated Creatinine Clearance: 23.5 mL/min (A) (by C-G formula based on SCr of 2.69 mg/dL (H)).    Allergies  Allergen Reactions  . Propoxyphene N-Acetaminophen Other (See Comments)    Severe headache  . Hydrocodone-Acetaminophen Other (See Comments)     Severe headache   . Astemizole Rash and Other (See Comments)    headache   . Cephalosporins Rash    Vanc 7/30 >> 8/3 CTX 7/30 >> 8/3 Zosyn 8/3 >>  7/30 covid / MRSA PCR - negative 7/30 UCx - suggest recollection 7/30 BCx - NGTD 8/3 UCx -   Nazli Penn D. Mina Marble, PharmD, BCPS, [place] 05/05/2020, 12:54 PM

## 2020-05-05 NOTE — Progress Notes (Addendum)
SW consult placed:   Pt needs outpatient wound care consult and help finding new PCP - would benefit from MD who makes house calls.  Also, is bed bound and has to make regular trips to [place] to [facility] for renal transplant follow up appts.  Is there transportation assistance for this?  Son, [name] should be primary contact as pt's spouse is HD patient too and other children are hard to reach at work during the day.  Gave her ring (from left hand-gold in color with multiple clear stones) to her son [name] to take home.

## 2020-05-05 NOTE — Progress Notes (Signed)
   05/05/20 0030  Assess: MEWS Score  BP (!) 71/33  Pulse Rate 94  ECG Heart Rate 95  Resp 18  Assess: MEWS Score  MEWS Temp 0  MEWS Systolic 2  MEWS Pulse 0  MEWS RR 0  MEWS LOC 0  MEWS Score 2  MEWS Score Color Yellow  Assess: if the MEWS score is Yellow or Red  Were vital signs taken at a resting state? Yes  Focused Assessment No change from prior assessment  Early Detection of Sepsis Score *See Row Information* Medium  MEWS guidelines implemented *See Row Information* No, previously yellow, continue vital signs every 4 hours  Take Vital Signs  Increase Vital Sign Frequency   (q 30 mins)  Escalate  MEWS: Escalate Yellow: discuss with charge nurse/RN and consider discussing with provider and RRT  Notify: Charge Nurse/RN  Name of Charge Nurse/RN Notified Karlene  Date Charge Nurse/RN Notified 05/05/20  Time Charge Nurse/RN Notified 0030  Notify: Provider  Provider Name/Title Dr. Clearence Ped  Date Provider Notified 05/05/20  Time Provider Notified 0030  Notification Type Page  Notification Reason Other (Comment) (lower BP)  Response See new orders  Date of Provider Response 05/05/20  Time of Provider Response [phone]  Document  Progress note created (see row info) Yes  Patient asymptomatic. Alert and Oriented. Received order for 1000 ml bolus.Will continue to monitor the BP closely.

## 2020-05-05 NOTE — Procedures (Signed)
Arterial Catheter Insertion Procedure Note  ORAL HALLGREN  [id number]  July 18, 1961  Date:05/05/20  Time:10:49 AM    Provider Performing: Cristal Generous    Procedure: Insertion of Arterial Line [phone]) with US guidance (16244)   Indication(s) Blood pressure monitoring and/or need for frequent ABGs  Consent Risks of the procedure as well as the alternatives and risks of each were explained to the patient and/or caregiver.  Consent for the procedure was obtained and is signed in the bedside chart  Anesthesia 1% lidocaine    Time Out Verified patient identification, verified procedure, site/side was marked, verified correct patient position, special equipment/implants available, medications/allergies/relevant history reviewed, required imaging and test results available.   Sterile Technique Maximal sterile technique including full sterile barrier drape, hand hygiene, sterile gown, sterile gloves, mask, hair covering, sterile ultrasound probe cover (if used).   Procedure Description Area of catheter insertion was cleaned with chlorhexidine and draped in sterile fashion. With real-time ultrasound guidance an arterial catheter was placed into the left radial artery.  Appropriate arterial tracings confirmed on monitor.     Complications/Tolerance None; patient tolerated the procedure well.   EBL Minimal   Specimen(s) None   Eliseo Gum MSN, AGACNP-BC [place] [phone] If no answer, [phone] 05/05/2020, 10:50 AM

## 2020-05-05 NOTE — Progress Notes (Signed)
A line BP markedly elevated with MAPs in 100s. Cuff still reading 60s/30s. - Stop stress steroids, resume PTA prednisone - Narrow abx to zosyn monotherapy - Need to figure out way to get accurate noninvasive pressure before moving out of ICU, discussed with primary  Erskine Emery MD PCCM

## 2020-05-05 NOTE — Progress Notes (Signed)
Patient ID: Wanda [name], female   DOB: 1960/12/11, 59 y.o.   MRN: [id number]  PROGRESS NOTE    Wanda [name]  BTD:[id number] DOB: Jul 10, 1961 DOA: 05/01/2020 PCP: Wanda Dy, MD   Brief Narrative:   59 y.o. female with medical history significant of diabetes type 2, hypertension, paroxysmal A. fib on Eliquis, hyperlipidemia, ESRD with failed kidney transplant in 2013 and repeat transplant in September 2020 at [facility] with current chronic renal disease stage IV, anemia of chronic disease, history of gastric sleeve surgery, hypoalbuminemia, right foot ulcer, chronic neuropathy presented with worsening right foot ulcer drainage getting worse over the last [redacted] weeks along with fever for 2 days.  On presentation, she was febrile with leukocytosis; x-ray of the right foot did not show any evidence of osteomyelitis.  She was started on IV fluids and antibiotics.  Assessment & Plan:   Sepsis: Present on admission Right foot ulcer infection with surrounding cellulitis, rule out osteomyelitis Leukocytosis -Patient presented with worsening right foot ulcer drainage over the last [redacted] weeks along with fever for the last couple of days.  Right foot x-ray does not show evidence of osteomyelitis. -Continue Rocephin and vancomycin.    Cultures negative so far. -MRI of the right foot without contrast did not show any evidence of osteomyelitis but showed superficial phlegmon overlying the posterior calcaneus. -Wound care evaluation appreciated.   -Orthopedic evaluation appreciated.  Follow further recommendations for need for any surgical intervention. -Recent lower extremity arterial duplex did not show any significant stenosis involving the visualized arteries of bilateral lower extremities -CRP 14.3 and white cell count is 19.9 this morning  Hypotension -Blood pressure very low for the last more than 24 hours despite getting IV fluid boluses and maintenance IV fluids along with one-time dose of IV  albumin as well.  She has also been started on midodrine 10 mg 3 times a day. -Blood pressure still dropped to the 50s overnight.  IV fluids discontinued by nephrology this morning.  Stress dose steroids have been started by nephrology. -I have consulted PCCM for further help with management. -Patient usually asymptomatic even with very low blood pressure.  Chronic kidney disease stage IV Metabolic acidosis History of ESRD status post renal transplant on chronic immunosuppressive medications -Baseline creatinine around 2.90-3.  Creatinine on presentation was 3.09.  Creatinine 2.69 this morning.  Bicarb is still at 14. - Monitor creatinine.  Strict input and output.  Avoid nephrotoxic medications. -Prednisone switched to IV Solu-Medrol by nephrology this morning.  Continue tacrolimus.  Bactrim on hold. -Nephrology following  Paroxysmal A. Fib -Held Eliquis in case patient undergoes surgical intervention. -Continue amiodarone.  Currently rate controlled  Anemia of chronic disease -No signs of bleeding.  Monitor.  Cachexia Hypoalbuminemia -Nutrition following.  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  CBGs with SSI.  Carb modified diet.    DVT prophylaxis: Heparin Code Status: Full Family Communication: None at bedside Disposition Plan: Status is: Inpatient  Remains inpatient appropriate because:Inpatient level of care appropriate due to severity of illness   Dispo: The patient is from: Home              Anticipated d/c is to: Home              Anticipated d/c date is: > 3 days              Patient currently is not medically stable to d/c.   Consultants: Nephrology  Procedures: None  Antimicrobials:  Rocephin and vancomycin from 05/01/2020 onwards   Subjective: Patient seen and examined at bedside.  Denies any dizziness, chest pain, worsening shortness of breath.  Nursing staff reported persistent hypotension overnight as well  despite receiving IV fluids.   Objective: Vitals:   05/05/20 0402 05/05/20 0431 05/05/20 0503 05/05/20 0530  BP: (!) 90/34 (!) 50/40 122/60 (!) 95/41  Pulse: 87 93 92 90  Resp: 16 16 15 16   Temp: 98.3 F (36.8 C)     TempSrc: Oral     SpO2: 99%     Weight:      Height:        Intake/Output Summary (Last 24 hours) at 05/05/2020 0714 Last data filed at 05/05/2020 0500 Gross per 24 hour  Intake 6887.41 ml  Output --  Net 6887.41 ml   Filed Weights   05/01/20 1444 05/01/20 2345  Weight: 78.5 kg 70.1 kg    Examination:  General exam: No acute distress.  Looks older than stated age.  Looks chronically ill Respiratory system: Bilateral decreased breath sounds at bases with some crackles.  Cardiovascular system: Rate controlled, S1-S2 heard Gastrointestinal system: Abdomen is nondistended, soft and nontender.  Bowel sounds heard  extremities: Bilateral lower extremity edema present.  No cyanosis. Central nervous system: Awake and oriented.  Still slightly slow to respond to questions.  Poor historian.  No focal neurological deficits. Moving extremities Skin: Right foot dressing present. Right antecubital fossa fistula present.  No obvious ecchymosis/other lesions  psychiatry: Has flat affect.  Poor historian.   Data Reviewed: I have personally reviewed following labs and imaging studies  CBC: Recent Labs  Lab 05/01/20 1500 05/01/20 1500 05/01/20 2015 05/02/20 0106 05/03/20 0131 05/04/20 0154 05/05/20 0208  WBC 24.1*   < > 22.9* 23.6* 20.0* 18.8* 19.9*  NEUTROABS 21.9*  --   --   --  15.9* 15.3* 17.7*  HGB 8.5*   < > 10.4* 8.7* 8.6* 8.7* 8.5*  HCT 27.4*   < > 34.3* 27.8* 27.0* 27.8* 27.4*  MCV 103.8*   < > 104.9* 101.5* 101.5* 100.0 102.6*  PLT 337   < > 352 323 304 261 257   < > = values in this interval not displayed.   Basic Metabolic Panel: Recent Labs  Lab 05/01/20 1500 05/02/20 0106 05/03/20 0131 05/04/20 0154 05/05/20 0208  NA 136 134* 134* 135 136  K 4.9  4.8 5.3* 4.9 5.1  CL 111 110 110 112* 110  CO2 16* 16* 15* 15* 14*  GLUCOSE 85 100* 141* 133* 160*  BUN 51* 46* 47* 48* 45*  CREATININE 3.09* 2.81* 2.74* 2.93* 2.69*  CALCIUM 8.3* 8.4* 8.2* 8.5* 8.2*  MG  --   --  1.8 1.8 1.8   GFR: Estimated Creatinine Clearance: 23.5 mL/min (A) (by C-G formula based on SCr of 2.69 mg/dL (H)). Liver Function Tests: Recent Labs  Lab 05/01/20 1500 05/02/20 0106  AST 18 21  ALT 9 10  ALKPHOS 287* 301*  BILITOT 0.8 0.9  PROT 5.2* 5.3*  ALBUMIN 1.5* 1.5*   No results for input(s): LIPASE, AMYLASE in the last 168 hours. No results for input(s): AMMONIA in the last 168 hours. Coagulation Profile: Recent Labs  Lab 05/01/20 1500 05/02/20 0106  INR 2.7* 2.1*   Cardiac Enzymes: No results for input(s): CKTOTAL, CKMB, CKMBINDEX, TROPONINI in the last 168 hours. BNP (last 3 results) No results for input(s): PROBNP in the last 8760 hours. HbA1C: No results for input(s): HGBA1C in the last 72  hours. CBG: Recent Labs  Lab 05/04/20 0643 05/04/20 1103 05/04/20 1607 05/04/20 2057 05/05/20 0603  GLUCAP 103* 128* 172* 163* 139*   Lipid Profile: No results for input(s): CHOL, HDL, LDLCALC, TRIG, CHOLHDL, LDLDIRECT in the last 72 hours. Thyroid Function Tests: No results for input(s): TSH, T4TOTAL, FREET4, T3FREE, THYROIDAB in the last 72 hours. Anemia Panel: No results for input(s): VITAMINB12, FOLATE, FERRITIN, TIBC, IRON, RETICCTPCT in the last 72 hours. Sepsis Labs: Recent Labs  Lab 05/01/20 1500 05/02/20 0106  LATICACIDVEN 1.0 1.3    Recent Results (from the past 240 hour(s))  Blood Culture (routine x 2)     Status: None (Preliminary result)   Collection Time: 05/01/20  3:03 PM   Specimen: BLOOD  Result Value Ref Range Status   Specimen Description BLOOD BLOOD RIGHT FOREARM  Final   Special Requests   Final    BOTTLES DRAWN AEROBIC AND ANAEROBIC Blood Culture results may not be optimal due to an inadequate volume of blood received  in culture bottles   Culture   Final    NO GROWTH 3 DAYS Performed at [facility], [place] [address], [place], [place] [address]    Report Status PENDING  Incomplete  Blood Culture (routine x 2)     Status: None (Preliminary result)   Collection Time: 05/01/20  3:15 PM   Specimen: BLOOD LEFT ARM  Result Value Ref Range Status   Specimen Description BLOOD LEFT ARM  Final   Special Requests   Final    BOTTLES DRAWN AEROBIC AND ANAEROBIC Blood Culture results may not be optimal due to an inadequate volume of blood received in culture bottles   Culture   Final    NO GROWTH 3 DAYS Performed at [facility], [place] [address], [place], [place] [address]    Report Status PENDING  Incomplete  SARS Coronavirus 2 by RT PCR (hospital order, performed in [facility] hospital lab) Nasopharyngeal Nasopharyngeal Swab     Status: None   Collection Time: 05/01/20  5:27 PM   Specimen: Nasopharyngeal Swab  Result Value Ref Range Status   SARS Coronavirus 2 NEGATIVE NEGATIVE Final    Comment: (NOTE) SARS-CoV-2 target nucleic acids are NOT DETECTED.  The SARS-CoV-2 RNA is generally detectable in upper and lower respiratory specimens during the acute phase of infection. The lowest concentration of SARS-CoV-2 viral copies this assay can detect is 250 copies / mL. A negative result does not preclude SARS-CoV-2 infection and should not be used as the sole basis for treatment or other patient management decisions.  A negative result may occur with improper specimen collection / handling, submission of specimen other than nasopharyngeal swab, presence of viral mutation(s) within the areas targeted by this assay, and inadequate number of viral copies (<250 copies / mL). A negative result must be combined with clinical observations, patient history, and epidemiological information.  Fact Sheet for Patients:   StrictlyIdeas.no  Fact Sheet for Healthcare  Providers: BankingDealers.co.za  This test is not yet approved or  cleared by the [place] FDA and has been authorized for detection and/or diagnosis of SARS-CoV-2 by FDA under an Emergency Use Authorization (EUA).  This EUA will remain in effect (meaning this test can be used) for the duration of the COVID-19 declaration under Section 564(b)(1) of the Act, 21 U.S.C. section 360bbb-3(b)(1), unless the authorization is terminated or revoked sooner.  Performed at [facility], [place] [address], [place], [place] [address]   Urine  culture     Status: Abnormal   Collection Time: 05/01/20  8:11 PM   Specimen: In/Out Cath Urine  Result Value Ref Range Status   Specimen Description IN/OUT CATH URINE  Final   Special Requests   Final    NONE Performed at [facility], [address], [place], [place] [address]    Culture MULTIPLE SPECIES PRESENT, SUGGEST RECOLLECTION (A)  Final   Report Status 05/03/2020 FINAL  Final  MRSA PCR Screening     Status: None   Collection Time: 05/01/20 11:38 PM   Specimen: Nasal Mucosa; Nasopharyngeal  Result Value Ref Range Status   MRSA by PCR NEGATIVE NEGATIVE Final    Comment:        The GeneXpert MRSA Assay (FDA approved for NASAL specimens only), is one component of a comprehensive MRSA colonization surveillance program. It is not intended to diagnose MRSA infection nor to guide or monitor treatment for MRSA infections. Performed at [facility], [place] [address], [place], [place] [address]          Radiology Studies: No results found.      Scheduled Meds: . (feeding supplement) PROSource Plus  30 mL Oral BID BM  . amiodarone  200 mg Oral Daily  . darbepoetin (ARANESP) injection - NON-DIALYSIS  150 mcg Subcutaneous Q Mon-1800  . feeding supplement (ENSURE ENLIVE)  237 mL Oral TID BM  . folic acid  1 mg Oral Daily  . Gerhardt's butt cream   Topical QID  . heparin  5,000 Units Subcutaneous  Q8H  . insulin aspart  0-5 Units Subcutaneous QHS  . insulin aspart  0-6 Units Subcutaneous TID WC  . methylPREDNISolone (SOLU-MEDROL) injection  40 mg Intravenous Q6H  . midodrine  10 mg Oral TID WC  . multivitamin with minerals  1 tablet Oral Daily  . nortriptyline  25 mg Oral QHS  . pantoprazole  40 mg Oral Daily  . sodium bicarbonate  650 mg Oral BID  . tacrolimus  1.5 mg Oral Q12H  . thiamine  100 mg Oral Daily   Continuous Infusions: . cefTRIAXone (ROCEPHIN)  IV 2 g (05/04/20 1457)  . vancomycin 750 mg (05/04/20 1658)          Aline August, MD [facility] 05/05/2020, 7:14 AM

## 2020-05-05 NOTE — Progress Notes (Signed)
   05/05/20 0727  Vitals  BP (!) 74/28  MAP (mmHg) (!) 37  BP Location Left Leg  BP Method Automatic  Patient Position (if appropriate) Lying  Pulse Rate 88  ECG Heart Rate 88  Resp 13  Oxygen Therapy  SpO2 100 %  O2 Device Room Air  MEWS Score  MEWS Temp 0  MEWS Systolic 2  MEWS Pulse 0  MEWS RR 1  MEWS LOC 0  MEWS Score 3  MEWS Score Color Yellow  Provider Notification  Provider Name/Title dr Tamala Julian  Date Provider Notified 05/05/20  Time Provider Notified [phone]  Notification Type Rounds (face to face)  Notification Reason Other (Comment) (lower bp)  Response See new orders  Date of Provider Response 05/05/20  Time of Provider Response [phone]  dr at bedside.

## 2020-05-05 NOTE — Consult Note (Addendum)
Patient seen and examined with Resident Physician.  Agree with their assessment and plan as written.  S: Seen for persistent hypotension, question developing septic shock.  Has chronic right heel wound was getting worse with drainage and fever.  Evaluated by orthopedics who wants to try conservative management because surgical healing unlikely to occur.  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  Overnight 8/2-8/3, persistent hypotension with 6L crystalloid administered.  BP measured in left lower arm, this is her usual spot, says home Bps are usually in 110s.  She denies any current complaints to me, no dizziness, CP, SOB.   O: Blood pressure (!) 99/47, pulse (!) 102, temperature 97.9 F (36.6 C), temperature source Oral, resp. rate 15, height 5\' 9"  (1.753 m), weight 70.1 kg, SpO2 100 %.  No acute distress RUE graft Lungs clear Heart slightly tachycardic Ext warm RLE heel unwrapped: foul smelling necrotic, some pus visible, right leg with no signs of compartment syndrome, tracking etc Flat affect WBC high, stable Cr elevated but at baseline  A:  -Undifferentiated shock, on chronic steroids PTA, unresponsive to fluids.  Clinical appearance suggests this may be a cuff issue.  Different machine and different ext all show low readings so should treat as real for now. -R heel infected ulcer with orthopedic following -Hx renal transplant on immunosuppressants -Previous diabetes, her A1c is now 4.1% so not really a diagnosis anymore -Failure to thrive, 27kg weight loss in past year -Severe protein calorie malnutrition present on admission  P:  - Stress steroids - Echo - Check lactate - Continue abx - Place foley, strict I/O, UA - Transfer to ICU, place arterial line - Immunosuppression per pharmacy and nephrology - If persistent shock probably need a CT of RLE - Patient updated regarding plan of care  The patient is critically ill  with multiple organ systems failure and requires high complexity decision making for assessment and support, frequent evaluation and titration of therapies, application of advanced monitoring technologies and extensive interpretation of multiple databases. Critical Care Time devoted to patient care services described in this note independent of APP/resident  time is 32 minutes.   05/05/2020 Erskine Emery MD      NAME:  Wanda [name], MRN:  [id number], DOB:  [birth year], LOS: 4 ADMISSION DATE:  05/01/2020, CONSULTATION DATE:  05/05/2020 REFERRING MD:  Dr. Starla Link, CHIEF COMPLAINT:  Hypotension   Brief History   Wanda [name] is a 59 yo female with PMH of Type 2 DM, HTN, paroxysmal A. fib, hyperlipidemia, ESRD with kidney transplant x2, chronic neuropathy who was admitted to the hospital for treatment of sepsis secondary to right foot ulcer infection with surrounding cellulitis.  Imaging did not show evidence for osteomyelitis.  She is admitted to the ICU for hypotension with concern that she may require pressor support.  History of present illness   Patient is seen at bedside.  She appears comfortable and in no acute distress.  Patient is currently treated for sepsis secondary to right heel wound that was there for the last 2 months.She is on vancomycin and ceftriaxone.  Orthopedics was consulted. Her MAP was in the 50s last night and this morning.  The blood pressure cuff was in the lower arm.  Patient denies chest pain, shortness of breath, dizziness, abdominal pain, fever, chills, or pain of right heel.   Past Medical History  Type 2 diabetes Hypertension Paroxysmal A. fib Hyperlipidemia ESRD Anemia chronic disease History of gastric sleeve  surgery Hypoalbuminemia Chronic neuropathy  Significant Hospital Events   05/01/2020 admitted for sepsis secondary to right foot ulcer  Consults:  Orthopedic PCCM  Procedures:  N/A  Significant Diagnostic Tests:  Right foot x-ray: Soft tissue  irregularity involving the posterior aspect of the heel, suspicious for ulcer.  No evidence of osteomyelitis Right tibia/fibula x-ray: No evidence of osteomyelitis Right foot MRI: superficial ulceration with extensive surrounding Cellulitis. No definitive evidence of osteomyelitis Micro Data:  Blood culture x2: No growth day 3 Urine culture: Multiple species present, possible contamination MRSA PCR: Negative Covid: Negative  Antimicrobials:  Vancomycin 7/31 >> Ceftriaxone 7/31 >>  Interim history/subjective:  Patient currently feeling well with no complaints.  Patient denies chest pain, shortness of breath, dizziness, abdominal pain, fever, chills or pain at right heel.   Objective   Blood pressure (!) 99/47, pulse 87, temperature 98.3 F (36.8 C), temperature source Oral, resp. rate 14, height 5\' 9"  (1.753 m), weight 70.1 kg, SpO2 100 %.        Intake/Output Summary (Last 24 hours) at 05/05/2020 0906 Last data filed at 05/05/2020 0500 Gross per 24 hour  Intake 6887.41 ml  Output --  Net 6887.41 ml   Filed Weights   05/01/20 1444 05/01/20 2345  Weight: 78.5 kg 70.1 kg    Examination: Physical Exam Constitutional:      General: She is not in acute distress.    Appearance: Normal appearance. She is not toxic-appearing.  HENT:     Head: Normocephalic.  Eyes:     General: No scleral icterus.       Right eye: No discharge.        Left eye: No discharge.     Conjunctiva/sclera: Conjunctivae normal.  Cardiovascular:     Rate and Rhythm: Normal rate and regular rhythm.     Heart sounds: No murmur heard.   Pulmonary:     Effort: Pulmonary effort is normal. No respiratory distress.  Abdominal:     General: There is no distension.     Palpations: Abdomen is soft.     Tenderness: There is no abdominal tenderness.  Musculoskeletal:     Cervical back: Normal range of motion.     Comments: Circumferential, ulcerated, discharging wound noted at right heel.  Odorous smell.  No  visible bleeding.  No pain to palpation  Neurological:     Mental Status: She is alert.  Psychiatric:        Mood and Affect: Mood normal.        Behavior: Behavior normal.     Resolved Hospital Problem list     Assessment & Plan:  Sepsis Right foot ulcer infection without osteomyelitis Chronic kidney disease stage IV Paroxysmal A. fib Anemia of chronic disease Hypoalbuminemia Type 2 diabetes Chronic neuropathy  Plan:  Sepsis Right foot ulcer infection without osteomyelitis  Patient has history of type 2 diabetes with chronic neuropathy.  The foot wound was present in the last to 2 months.  MRI of the right foot confirms cellulitis without involvement of osteomyelitis.  Patient was treated with vancomycin and ceftriaxone.  Patient is currently afebrile however WBC increased to 19.9 from 18 yesterday.  Her lactic acid is 0.9, which makes it unlikely for the patient to be truly hypotensive. A-line was put in due to the suspicion that her blood pressure is not correctly measured.  The blood pressure measured from A-line was 160/60.  Orthopedics has seen the patient and recommended observation while being on antibiotics given that  this is a superficial infection. -Appreciate orthopedics recommendation -Continue vancomycin and ceftriaxone -Continue to follow trend white blood count and fever -Continue to follow blood culture -Continue midodrine 10 mg 3 times daily.  Will consider discontinue if blood pressure continue to be stable. -Fentanyl as needed for pain   Chronic kidney disease stage IV History of renal transplant Hematuria Patient has the first transplant at [facility] and the second at [facility] in September 2020.  Nephrology is is on board.  UA shows moderate hemoglobin with large leukocytes.  Renal ultrasound done in May 2021 did not reveal any obstructions or stones.  Will continue to trend WBC and watch for fever.  Vancomycin and ceftriaxone should provide enough coverage if  patient has a UTI. -Continue tacrolimus 1.5 mg p.o. twice daily -Continue Solu-Cortef 50 mg IV every 6 hours -BMP daily   Paroxysmal A. fib Currently rate controlled.  Eliquis is currently held in case patient will have any surgical intervention -Continue amiodarone 200 mg p.o. daily    Type 2 diabetes Chronic neuropathy Hemoglobin A1c was 4.16, likely secondary to malnutrition. -Continue feeding supplement with Prosource and Ensure -Continue nortriptyline 25 mg p.o. daily for chronic neuropathy -Sliding scale insulin -CBG monitor   Best practice:  Diet: Carb modified Pain/Anxiety/Delirium protocol (if indicated): Fentanyl as needed VAP protocol (if indicated): N/A DVT prophylaxis: Heparin GI prophylaxis: Protonix Glucose control: Sliding scale Mobility: Bedrest Code Status: Full Family Communication: Per primary team Disposition: ICU for blood pressure monitor  Critical care time: 30 min     Gaylan Gerold, DO My pager: [phone]

## 2020-05-06 DIAGNOSIS — L8990 Pressure ulcer of unspecified site, unspecified stage: Secondary | ICD-10-CM | POA: Insufficient documentation

## 2020-05-06 LAB — CULTURE, BLOOD (ROUTINE X 2)
Culture: NO GROWTH
Culture: NO GROWTH

## 2020-05-06 LAB — CBC WITH DIFFERENTIAL/PLATELET
Abs Immature Granulocytes: 0.91 10*3/uL — ABNORMAL HIGH (ref 0.00–0.07)
Basophils Absolute: 0 10*3/uL (ref 0.0–0.1)
Basophils Relative: 0 %
Eosinophils Absolute: 0 10*3/uL (ref 0.0–0.5)
Eosinophils Relative: 0 %
HCT: 25.5 % — ABNORMAL LOW (ref 36.0–46.0)
Hemoglobin: 8.1 g/dL — ABNORMAL LOW (ref 12.0–15.0)
Immature Granulocytes: 5 %
Lymphocytes Relative: 9 %
Lymphs Abs: 1.5 10*3/uL (ref 0.7–4.0)
MCH: 31.5 pg (ref 26.0–34.0)
MCHC: 31.8 g/dL (ref 30.0–36.0)
MCV: 99.2 fL (ref 80.0–100.0)
Monocytes Absolute: 0.7 10*3/uL (ref 0.1–1.0)
Monocytes Relative: 4 %
Neutro Abs: 13.7 10*3/uL — ABNORMAL HIGH (ref 1.7–7.7)
Neutrophils Relative %: 82 %
Platelets: 264 10*3/uL (ref 150–400)
RBC: 2.57 MIL/uL — ABNORMAL LOW (ref 3.87–5.11)
RDW: 16 % — ABNORMAL HIGH (ref 11.5–15.5)
WBC: 16.8 10*3/uL — ABNORMAL HIGH (ref 4.0–10.5)
nRBC: 0 % (ref 0.0–0.2)

## 2020-05-06 LAB — URINE CULTURE

## 2020-05-06 LAB — BASIC METABOLIC PANEL
Anion gap: 8 (ref 5–15)
BUN: 46 mg/dL — ABNORMAL HIGH (ref 6–20)
CO2: 14 mmol/L — ABNORMAL LOW (ref 22–32)
Calcium: 8.5 mg/dL — ABNORMAL LOW (ref 8.9–10.3)
Chloride: 114 mmol/L — ABNORMAL HIGH (ref 98–111)
Creatinine, Ser: 2.79 mg/dL — ABNORMAL HIGH (ref 0.44–1.00)
GFR calc Af Amer: 21 mL/min — ABNORMAL LOW (ref >60)
GFR calc non Af Amer: 18 mL/min — ABNORMAL LOW (ref >60)
Glucose, Bld: 181 mg/dL — ABNORMAL HIGH (ref 70–99)
Potassium: 5.2 mmol/L — ABNORMAL HIGH (ref 3.5–5.1)
Sodium: 136 mmol/L (ref 135–145)

## 2020-05-06 LAB — GLUCOSE, CAPILLARY
Glucose-Capillary: 155 mg/dL — ABNORMAL HIGH (ref 70–99)
Glucose-Capillary: 167 mg/dL — ABNORMAL HIGH (ref 70–99)
Glucose-Capillary: 181 mg/dL — ABNORMAL HIGH (ref 70–99)
Glucose-Capillary: 185 mg/dL — ABNORMAL HIGH (ref 70–99)
Glucose-Capillary: 228 mg/dL — ABNORMAL HIGH (ref 70–99)

## 2020-05-06 LAB — C-REACTIVE PROTEIN: CRP: 9.1 mg/dL — ABNORMAL HIGH (ref <1.0)

## 2020-05-06 LAB — MAGNESIUM: Magnesium: 1.9 mg/dL (ref 1.7–2.4)

## 2020-05-06 LAB — TACROLIMUS LEVEL: Tacrolimus (FK506) - LabCorp: 9.4 ng/mL (ref 2.0–20.0)

## 2020-05-06 MED ORDER — FUROSEMIDE 10 MG/ML IJ SOLN
40.0000 mg | Freq: Two times a day (BID) | INTRAMUSCULAR | Status: DC
  Administered 2020-05-06 – 2020-05-08 (×5): 40 mg via INTRAVENOUS
  Filled 2020-05-06 (×5): qty 4

## 2020-05-06 MED ORDER — SODIUM BICARBONATE 650 MG PO TABS
1300.0000 mg | ORAL_TABLET | Freq: Two times a day (BID) | ORAL | Status: DC
  Administered 2020-05-06 – 2020-05-14 (×18): 1300 mg via ORAL
  Filled 2020-05-06 (×18): qty 2

## 2020-05-06 MED ORDER — INSULIN ASPART 100 UNIT/ML ~~LOC~~ SOLN
0.0000 [IU] | Freq: Three times a day (TID) | SUBCUTANEOUS | Status: DC
  Administered 2020-05-06: 3 [IU] via SUBCUTANEOUS
  Administered 2020-05-07: 2 [IU] via SUBCUTANEOUS
  Administered 2020-05-08: 3 [IU] via SUBCUTANEOUS
  Administered 2020-05-08: 2 [IU] via SUBCUTANEOUS
  Administered 2020-05-09 (×3): 5 [IU] via SUBCUTANEOUS
  Administered 2020-05-10: 8 [IU] via SUBCUTANEOUS
  Administered 2020-05-10: 11 [IU] via SUBCUTANEOUS
  Administered 2020-05-10 – 2020-05-11 (×2): 8 [IU] via SUBCUTANEOUS
  Administered 2020-05-11 – 2020-05-12 (×4): 5 [IU] via SUBCUTANEOUS
  Administered 2020-05-12 – 2020-05-14 (×5): 2 [IU] via SUBCUTANEOUS
  Administered 2020-05-14 – 2020-05-15 (×2): 3 [IU] via SUBCUTANEOUS
  Administered 2020-05-15: 5 [IU] via SUBCUTANEOUS
  Administered 2020-05-15 – 2020-05-17 (×4): 2 [IU] via SUBCUTANEOUS
  Administered 2020-05-18: 5 [IU] via SUBCUTANEOUS
  Administered 2020-05-18 – 2020-05-20 (×5): 3 [IU] via SUBCUTANEOUS
  Administered 2020-05-21 – 2020-05-26 (×9): 2 [IU] via SUBCUTANEOUS
  Administered 2020-05-27: 3 [IU] via SUBCUTANEOUS
  Administered 2020-05-27 – 2020-05-30 (×4): 2 [IU] via SUBCUTANEOUS

## 2020-05-06 NOTE — Progress Notes (Signed)
eLink Physician-Brief Progress Note Patient Name: MATISON NUCCIO DOB: 1961/03/06 MRN: [id number]   Date of Service  05/06/2020  HPI/Events of Note  Notified of decreased urine output, Average 10-15 cc/hr via foley cath.   eICU Interventions  No hemodynamic changes without other signs of hypoperfusion. Lactic acid normal yesterday morning. Will monitor for now, discussed with bedside RN     Intervention Category Intermediate Interventions: Oliguria - evaluation and management  Judd Lien 05/06/2020, 2:35 AM

## 2020-05-06 NOTE — Progress Notes (Signed)
Occupational Therapy Treatment Patient Details Name: Wanda [name] MRN: [id number] DOB: 04-19-1961 Today's Date: 05/06/2020    History of present illness Wanda [name] is a 59 y.o. female with medical history significant of diabetes type 2, hypertension, paroxysmal A. fib, hyperlipidemia, ESRD with failed kidney transplant in 2013 and repeat transplant in September 2020 at [facility] with current chronic renal disease stage IV, anemia of chronic disease, history of gastric sleeve surgery, hypoalbuminemia, right foot ulcer, chronic neuropathy presented with worsening right foot ulcer drainage getting worse over the last 2 weeks   OT comments  Today's session to focus on fine motor activities with provided handout, theraputty (very soft) and squeeze ball and address independence with self-feeding. Pt able to take bites of applesauce using built up utensil with red tubing. Pt completed AAROM UE and required modA+2 with RN assisting for rolling in bed to reposition. Will continue to follow pt to address independence with feeding and improving fine motor coordination.    Follow Up Recommendations  Supervision/Assistance - 24 hour;No OT follow up    Equipment Recommendations  None recommended by OT    Recommendations for Other Services      Precautions / Restrictions Precautions Precautions: Fall Precaution Comments: Bil heel wounds, sacral wound Restrictions Weight Bearing Restrictions: Yes RLE Weight Bearing: Non weight bearing LLE Weight Bearing: Non weight bearing       Mobility Bed Mobility Overal bed mobility: Needs Assistance Bed Mobility: Rolling Rolling: Mod assist;+2 for physical assistance;+2 for safety/equipment         General bed mobility comments: modA to roll to reposisiton for pressure relief  Transfers                 General transfer comment: deferred    Balance Overall balance assessment: Needs assistance     Sitting balance - Comments: bed  level secondary to pt level of arousal                                   ADL either performed or assessed with clinical judgement   ADL Overall ADL's : Needs assistance/impaired Eating/Feeding: Set up;Sitting Eating/Feeding Details (indicate cue type and reason): with built up handle                     Toilet Transfer: Maximal assistance;+2 for physical assistance;+2 for safety/equipment Toilet Transfer Details (indicate cue type and reason): maxA+2 for rolling in bed to reposition, simulating rolling on bed pan         Functional mobility during ADLs: Maximal assistance;+2 for physical assistance;+2 for safety/equipment       Vision       Perception     Praxis      Cognition Arousal/Alertness: Lethargic Behavior During Therapy: Flat affect Overall Cognitive Status: Within Functional Limits for tasks assessed                                 General Comments: pt with eyes shut majority of session, following commands consistently with increased time for processing        Exercises Exercises: General Upper Extremity;Other exercises General Exercises - Upper Extremity Shoulder Flexion: AAROM;Both;10 reps;Seated Shoulder Extension: AAROM;Both;10 reps;Seated Elbow Flexion: AAROM;Both;10 reps;Seated Elbow Extension: AAROM;Both;10 reps;Seated Wrist Flexion: AROM;Both;10 reps;Seated Wrist Extension: AROM;Both;10 reps;Seated Digit Composite Flexion: AROM;Both;10 reps;Seated Composite Extension: AROM;Both;10 reps;Seated Other  Exercises Other Exercises: provided pt with built up utensil, pt able to self-feed x5 bites of applesauce Other Exercises: provided pt with very soft theraputty and fine motor HEP, pt completed with minA   Shoulder Instructions       General Comments vss    Pertinent Vitals/ Pain       Pain Assessment: 0-10 Pain Score: 7  Pain Descriptors / Indicators: Aching;Sore Pain Intervention(s): Limited activity  within patient's tolerance;Monitored during session  Home Living                                          Prior Functioning/Environment              Frequency  Min 2X/week        Progress Toward Goals  OT Goals(current goals can now be found in the care plan section)  Progress towards OT goals: Progressing toward goals  Acute Rehab OT Goals Patient Stated Goal: to be able to feed self OT Goal Formulation: With patient Time For Goal Achievement: 05/17/20 Potential to Achieve Goals: Good ADL Goals Pt Will Perform Eating: with set-up;with adaptive utensils;bed level  Plan Discharge plan needs to be updated    Co-evaluation                 AM-PAC OT "6 Clicks" Daily Activity     Outcome Measure   Help from another person eating meals?: A Little Help from another person taking care of personal grooming?: A Lot Help from another person toileting, which includes using toliet, bedpan, or urinal?: Total Help from another person bathing (including washing, rinsing, drying)?: A Lot Help from another person to put on and taking off regular upper body clothing?: Total Help from another person to put on and taking off regular lower body clothing?: Total 6 Click Score: 10    End of Session    OT Visit Diagnosis: Other abnormalities of gait and mobility (R26.89);Muscle weakness (generalized) (M62.81)   Activity Tolerance Patient limited by fatigue;Patient limited by lethargy   Patient Left in bed;with call bell/phone within reach;with bed alarm set   Nurse Communication Mobility status;Need for lift equipment (maxi move)        Time: 8676-1950 OT Time Calculation (min): 28 min  Charges: OT General Charges $OT Visit: 1 Visit OT Treatments $Self Care/Home Management : 8-22 mins $Therapeutic Exercise: 8-22 mins  Helene Kelp OTR/L Acute Rehabilitation Services Office: [place] 05/06/2020, 2:14 PM

## 2020-05-06 NOTE — Progress Notes (Signed)
Ortho surgeon in dept stating pt son requested family meeting @ 12 noon to discuss plan of treatment, pt family currently not at bedside. This rn provided md with contact number for pt family. Consent form offered to md for use if needed, md deferred consent form at this time

## 2020-05-06 NOTE — Progress Notes (Signed)
Subjective: Pt stable overnight in ICU.  CCU and nephrology notes reviewed.  No c/o.  Son and husband in for this visit.  Per RN at bedside, pt has been sleepy this morning.   Objective: Vital signs in last 24 hours: Temp:  [97.7 F (36.5 C)-98.3 F (36.8 C)] 97.8 F (36.6 C) (08/04 0812) Pulse Rate:  [83-105] 87 (08/04 0600) Resp:  [12-22] 12 (08/04 0600) SpO2:  [100 %] 100 % (08/04 0600) Arterial Line BP: (125-174)/(54-75) 160/60 (08/04 0600)  Intake/Output from previous day: 08/03 0701 - 08/04 0700 In: 556.5 [P.O.:460; IV Piggyback:96.5] Out: 920 [Urine:920] Intake/Output this shift: Total I/O In: -  Out: 75 [Urine:75]  Recent Labs    05/04/20 0154 05/05/20 0208 05/06/20 0547  HGB 8.7* 8.5* 8.1*   Recent Labs    05/05/20 0208 05/06/20 0547  WBC 19.9* 16.8*  RBC 2.67* 2.57*  HCT 27.4* 25.5*  PLT 257 264   Recent Labs    05/05/20 0208 05/06/20 0547  NA 136 136  K 5.1 5.2*  CL 110 114*  CO2 14* 14*  BUN 45* 46*  CREATININE 2.69* 2.79*  GLUCOSE 160* 181*  CALCIUM 8.2* 8.5*   No results for input(s): LABPT, INR in the last 72 hours.  cachectic chronically ill appearing woman in nad.  Intermittently alert.  R foot with copious foul smelling drainage from the heel ulcer.    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  I believe this wound is unlikely to heal given her low albumin.  The options include continued observation and local wound care on abx vs. Surgical treatment.  Local debridement is not feasible since it will uncover the calcaneus without any coverage options.  BKA is the best surgical option.  There is still no guarantee of healing the stump, and revision amputation is a significant risk.  Today we discussed the risks and benefits of the alternative treatment options in detail.  She elects right BKA and would like to  proceed.  Her son agrees with this plan.  NPO after midnight.  Hold blood thinners.       Wanda [name] 05/06/2020, 11:59 AM

## 2020-05-06 NOTE — Progress Notes (Signed)
Subjective:  Events noted-  Moved to ICU for low BP- had Aline placed which revealed adequate BP-  Some question of dec UOP- total of 920 last 24 hours   Objective Vital signs in last 24 hours: Vitals:   05/06/20 0338 05/06/20 0400 05/06/20 0500 05/06/20 0600  BP:      Pulse:  87 86 87  Resp:  16 12 12   Temp: 98.3 F (36.8 C)     TempSrc: Oral     SpO2:  100% 100% 100%  Weight:      Height:       Weight change:   Intake/Output Summary (Last 24 hours) at 05/06/2020 9211 Last data filed at 05/06/2020 0600 Gross per 24 hour  Intake 556.48 ml  Output 920 ml  Net -363.52 ml    Assessment/ Plan: Pt is a 59 y.o. yo female with DM, HTN, Afib, s/p renal transplant who was admitted on 05/01/2020 with worsening drainage from right foot ulcer  Assessment/Plan: 1. S/p renal transplant-  Second transplant at [facility] in September of 2020-  C/b delayed graft function.  Allograft function is not great with crt of around 3 of late- no acute change with hospitalization. 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  Some controversy regarding prograf dose- put on what we think -  level pending.  UOP not robust but crt staying poor but stable  2. Foot ulcer drainage-  Imaging not showing overt osteo.  On zosyn -  Per primary team  3. Anemia- fairly advanced, likely due to CKD-   giving ESA- no iron due to infection 4. HTN/volume- hypotension,  Proven to not be an issue once a line placed. All parameters in place to fix BP have been stopped.  Will go ahead and give some lasix which will help her potassium as well  5. FTT-  Therapies in place,  Albumin of 1.5 gives poor prognosis - on prosource  6. Metabolic acidosis-   oral bicarb - will inc dose  [name]    Labs: Basic Metabolic Panel: Recent Labs  Lab 05/04/20 0154 05/05/20 0208 05/06/20 0547  NA 135 136 136  K 4.9 5.1 5.2*  CL 112* 110 114*  CO2 15* 14* 14*  GLUCOSE 133* 160* 181*  BUN 48* 45* 46*   CREATININE 2.93* 2.69* 2.79*  CALCIUM 8.5* 8.2* 8.5*   Liver Function Tests: Recent Labs  Lab 05/01/20 1500 05/02/20 0106  AST 18 21  ALT 9 10  ALKPHOS 287* 301*  BILITOT 0.8 0.9  PROT 5.2* 5.3*  ALBUMIN 1.5* 1.5*   No results for input(s): LIPASE, AMYLASE in the last 168 hours. No results for input(s): AMMONIA in the last 168 hours. CBC: Recent Labs  Lab 05/02/20 0106 05/02/20 0106 05/03/20 0131 05/03/20 0131 05/04/20 0154 05/05/20 0208 05/06/20 0547  WBC 23.6*   < > 20.0*   < > 18.8* 19.9* 16.8*  NEUTROABS  --   --  15.9*   < > 15.3* 17.7* 13.7*  HGB 8.7*   < > 8.6*   < > 8.7* 8.5* 8.1*  HCT 27.8*   < > 27.0*   < > 27.8* 27.4* 25.5*  MCV 101.5*  --  101.5*  --  100.0 102.6* 99.2  PLT 323   < > 304   < > 261 257 264   < > = values in this interval not displayed.   Cardiac Enzymes: No results for input(s): CKTOTAL, CKMB, CKMBINDEX,  TROPONINI in the last 168 hours. CBG: Recent Labs  Lab 05/04/20 2057 05/05/20 0603 05/05/20 1116 05/05/20 1821 05/05/20 2130  GLUCAP 163* 139*  139* 153* 200* 199*    Iron Studies: No results for input(s): IRON, TIBC, TRANSFERRIN, FERRITIN in the last 72 hours. Studies/Results: ECHOCARDIOGRAM COMPLETE  Result Date: 05/05/2020    ECHOCARDIOGRAM REPORT   Patient Name:   Wanda [name] Date of Exam: 05/05/2020 Medical Rec #:  [id number]         Height:       69.0 in Accession #:    [id number]        Weight:       154.5 lb Date of Birth:  12-23-1960        BSA:          1.852 m Patient Age:    59 years          BP:           99/47 mmHg Patient Gender: F                 HR:           91 bpm. Exam Location:  Inpatient Procedure: 2D Echo, Color Doppler and Cardiac Doppler Indications:    Abnormal ECG  History:        Patient has prior history of Echocardiogram examinations, most                 recent 02/08/2020. Arrythmias:Atrial Fibrillation; Risk                 Factors:Diabetes, Hypertension and Dyslipidemia. CKD.  Sonographer:    Clayton Lefort RDCS (AE) Referring Phys: [id number] [place]  1. Left ventricular ejection fraction, by estimation, is 70 to 75%. The left ventricle has hyperdynamic function. The left ventricle has no regional wall motion abnormalities. There is moderate concentric left ventricular hypertrophy. Indeterminate diastolic filling due to E-A fusion.  2. Right ventricular systolic function is normal. The right ventricular size is normal. Tricuspid regurgitation signal is inadequate for assessing PA pressure.  3. Left atrial size was moderately dilated.  4. Moderate pericardial effusion. The pericardial effusion is circumferential. There is no evidence of cardiac tamponade.  5. There is a small (8 x 2 mm) hyperechogenic mobile structure that prolapses in the LV outflow tract. This would be a highly atypical location for a vegetation. Suspect it is a segment of redundant mitral subvalvular apparatus. The mitral valve is degenerative. No evidence of mitral valve regurgitation.  6. The aortic valve is tricuspid. Aortic valve regurgitation is not visualized. Mild to moderate aortic valve sclerosis/calcification is present, without any evidence of aortic stenosis.  7. The inferior vena cava is normal in size with greater than 50% respiratory variability, suggesting right atrial pressure of 3 mmHg. Comparison(s): No significant change from prior study. Prior images reviewed side by side. The pericardial effusion is msaller. FINDINGS  Left Ventricle: Left ventricular ejection fraction, by estimation, is 70 to 75%. The left ventricle has hyperdynamic function. The left ventricle has no regional wall motion abnormalities. The left ventricular internal cavity size was normal in size. There is moderate concentric left ventricular hypertrophy. Indeterminate diastolic filling due to E-A fusion. Indeterminate filling pressures. Right Ventricle: The right ventricular size is normal. No increase in right ventricular wall thickness.  Right ventricular systolic function is normal. Tricuspid regurgitation signal is inadequate for assessing PA pressure. Left Atrium: Left atrial size was moderately dilated. Right  Atrium: Right atrial size was normal in size. Pericardium: A moderately sized pericardial effusion is present. The pericardial effusion is circumferential. There is no evidence of cardiac tamponade. Mitral Valve: There is a small (8 x 2 mm) hyperechogenic mobile structure that prolapses in the LV outflow tract. This would be a highly atypical location for a vegetation. Suspect it is a segment of redundant mitral subvalvular apparatus. The mitral valve is degenerative in appearance. There is mild thickening of the mitral valve leaflet(s). Mild to moderate mitral annular calcification. No evidence of mitral valve regurgitation. Tricuspid Valve: The tricuspid valve is normal in structure. Tricuspid valve regurgitation is trivial. Aortic Valve: The aortic valve is tricuspid. Aortic valve regurgitation is not visualized. Mild to moderate aortic valve sclerosis/calcification is present, without any evidence of aortic stenosis. Aortic valve mean gradient measures 4.0 mmHg. Aortic valve peak gradient measures 8.6 mmHg. Aortic valve area, by VTI measures 1.89 cm. Pulmonic Valve: The pulmonic valve was not well visualized. Pulmonic valve regurgitation is not visualized. Aorta: The aortic root and ascending aorta are structurally normal, with no evidence of dilitation. Venous: The inferior vena cava is normal in size with greater than 50% respiratory variability, suggesting right atrial pressure of 3 mmHg. IAS/Shunts: No atrial level shunt detected by color flow Doppler. Additional Comments: There is pleural effusion in the left lateral region.  LEFT VENTRICLE PLAX 2D LVIDd:         3.60 cm LVIDs:         2.30 cm LV PW:         1.50 cm LV IVS:        1.50 cm LVOT diam:     2.00 cm LV SV:         54 LV SV Index:   29 LVOT Area:     3.14 cm  RIGHT  VENTRICLE             IVC RV Basal diam:  2.20 cm     IVC diam: 1.20 cm RV S prime:     12.80 cm/s TAPSE (M-mode): 1.8 cm LEFT ATRIUM             Index       RIGHT ATRIUM          Index LA diam:        3.20 cm 1.73 cm/m  RA Area:     8.70 cm LA Vol (A2C):   75.0 ml 40.51 ml/m RA Volume:   16.40 ml 8.86 ml/m LA Vol (A4C):   70.2 ml 37.91 ml/m LA Biplane Vol: 75.9 ml 40.99 ml/m  AORTIC VALVE AV Area (Vmax):    2.04 cm AV Area (Vmean):   2.05 cm AV Area (VTI):     1.89 cm AV Vmax:           147.00 cm/s AV Vmean:          91.700 cm/s AV VTI:            0.286 m AV Peak Grad:      8.6 mmHg AV Mean Grad:      4.0 mmHg LVOT Vmax:         95.30 cm/s LVOT Vmean:        59.800 cm/s LVOT VTI:          0.172 m LVOT/AV VTI ratio: 0.60  AORTA Ao Root diam: 3.10 cm Ao Asc diam:  2.40 cm  SHUNTS Systemic VTI:  0.17 m Systemic Diam: 2.00 cm  Sanda Klein MD Electronically signed by Sanda Klein MD Signature Date/Time: 05/05/2020/2:45:51 PM    Final    Medications: Infusions: . piperacillin-tazobactam (ZOSYN)  IV 3.375 g (05/06/20 0541)    Scheduled Medications: . (feeding supplement) PROSource Plus  30 mL Oral BID BM  . amiodarone  200 mg Oral Daily  . Chlorhexidine Gluconate Cloth  6 each Topical Daily  . darbepoetin (ARANESP) injection - NON-DIALYSIS  150 mcg Subcutaneous Q Mon-1800  . feeding supplement (ENSURE ENLIVE)  237 mL Oral TID BM  . folic acid  1 mg Oral Daily  . heparin  5,000 Units Subcutaneous Q8H  . insulin aspart  0-5 Units Subcutaneous QHS  . insulin aspart  0-6 Units Subcutaneous TID WC  . lidocaine  5 mL Intradermal Once  . multivitamin with minerals  1 tablet Oral Daily  . nortriptyline  25 mg Oral QHS  . pantoprazole  40 mg Oral Daily  . predniSONE  5 mg Oral Q breakfast  . sodium bicarbonate  650 mg Oral BID  . tacrolimus  1.5 mg Oral Q12H  . thiamine  100 mg Oral Daily    have reviewed scheduled and prn medications.  Physical Exam: General:  Flat, no c/o's  Heart:  RRR Lungs: mostly clear Abdomen: soft, non tender Extremities: pitting edema to dep areas -  bilat boots and bandages     05/06/2020,7:18 AM  LOS: 5 days

## 2020-05-06 NOTE — Progress Notes (Signed)
eLink Physician-Brief Progress Note Patient Name: PEREL HAUSCHILD DOB: [birth year] MRN: [id number]   Date of Service  05/06/2020  HPI/Events of Note  Notified of decreased urine output, Average 10-15 cc/hr via foley cath.   eICU Interventions  No hemodynamic changes without other signs of hypoperfusion. Lactic acid normal yesterday morning. Will monitor for now, discussed with bedside RN     Intervention Category Intermediate Interventions: Oliguria - evaluation and management  Judd Lien 05/06/2020, 2:35 AM
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
## 2020-05-06 NOTE — Progress Notes (Signed)
Patient ID: Wanda [name], female   DOB: Jul 25, 1961, 59 y.o.   MRN: [id number]  PROGRESS NOTE    Wanda [name]  APO:[id number] DOB: 1961-09-04 DOA: 05/01/2020 PCP: Lorene Dy, MD   Brief Narrative:   59 y.o. female with medical history significant of diabetes type 2, hypertension, paroxysmal A. fib on Eliquis, hyperlipidemia, ESRD with failed kidney transplant in 2013 and repeat transplant in September 2020 at [facility] with current chronic renal disease stage IV, anemia of chronic disease, history of gastric sleeve surgery, hypoalbuminemia, right foot ulcer, chronic neuropathy presented with worsening right foot ulcer drainage getting worse over the last [redacted] weeks along with fever for 2 days.  On presentation, she was febrile with leukocytosis; x-ray of the right foot did not show any evidence of osteomyelitis.  She was started on IV fluids and antibiotics.  Assessment & Plan:   Sepsis: Present on admission Right foot ulcer infection with surrounding cellulitis,  -Patient presented with worsening right foot ulcer drainage over the last [redacted] weeks along with fever for the last couple of days.  Right foot x-ray does not show evidence of osteomyelitis. -Cultures negative so far. -MRI of the right foot without contrast did not show any evidence of osteomyelitis but showed superficial phlegmon overlying the posterior calcaneus. -Wound care evaluation appreciated.   -Recent lower extremity arterial duplex did not show any significant stenosis involving the visualized arteries of bilateral lower extremities Orthopedic evaluation/assistance appreciated.  They have had long discussion with the family and have mutually agreed upon to proceed with right BKA soon.  She continues to have leukocytosis.  Hypotension: Per chart review, it sounds like patient's blood pressure was thought to be low based on the noninvasive measurement and for this reason, patient was transferred to ICU on 05/05/2020.  A line  was placed and blood pressure to A-line was showing hypertension.  This indicates pseudohypotension monitor by external devices.  PCCM saw patient and signed off.  Patient's blood pressure has remained stable.  She has been switched back to oral prednisone.  Chronic kidney disease stage IV Metabolic acidosis History of ESRD status post renal transplant on chronic immunosuppressive medications -Baseline creatinine around 2.90-3.  Creatinine on presentation was 3.09.  Creatinine 2. 7 9 this morning.  Bicarb is still at 14. - Monitor creatinine.  Strict input and output.  Avoid nephrotoxic medications.  Nephrology following.  Appreciate their help and defer management to them.  Paroxysmal A. Fib -Held Eliquis due to planned surgical intervention/BKA. -Continue amiodarone.  Currently rate controlled  Anemia of chronic disease -No signs of bleeding.  Monitor.  Hemoglobin stable.  Cachexia Hypoalbuminemia -Nutrition following.  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  CBGs with SSI.  Carb modified diet.   DVT prophylaxis: Heparin Code Status: Full Family Communication: None at bedside Disposition Plan: Status is: Inpatient  Remains inpatient appropriate because:Inpatient level of care appropriate due to severity of illness   Dispo: The patient is from: Home              Anticipated d/c is to: Home              Anticipated d/c date is: > 3 days              Patient currently is not medically stable to d/c.   Consultants: Nephrology/orthopedics  Procedures: None  Antimicrobials: Rocephin and vancomycin from 05/01/2020 onwards   Subjective: Patient seen and examined early this morning.  No family  member was present.  Patient had no complaint.  Pain was controlled.  She told me that she has made her mind for surgical intervention/BKA.  Objective: Vitals:   05/06/20 0500 05/06/20 0600 05/06/20 0812 05/06/20 1350  BP:      Pulse: 86 87      Resp: 12 12    Temp:   97.8 F (36.6 C) 97.7 F (36.5 C)  TempSrc:   Oral Oral  SpO2: 100% 100%    Weight:      Height:        Intake/Output Summary (Last 24 hours) at 05/06/2020 1357 Last data filed at 05/06/2020 1050 Gross per 24 hour  Intake 598.48 ml  Output 995 ml  Net -396.52 ml   Filed Weights   05/01/20 1444 05/01/20 2345  Weight: 78.5 kg 70.1 kg    Examination:  General exam: Appears calm and comfortable  Respiratory system: Clear to auscultation. Respiratory effort normal. Cardiovascular system: S1 & S2 heard, RRR. No JVD, murmurs, rubs, gallops or clicks. No pedal edema. Gastrointestinal system: Abdomen is nondistended, soft and nontender. No organomegaly or masses felt. Normal bowel sounds heard. Central nervous system: Alert and oriented. No focal neurological deficits. Extremities: Symmetric 5 x 5 power. Skin: Dressing/heel pads on both feet, not removed.  Direct visualization deferred to orthopedics Psychiatry: Judgement and insight appear normal. Mood & affect appropriate.     Data Reviewed: I have personally reviewed following labs and imaging studies  CBC: Recent Labs  Lab 05/01/20 1500 05/01/20 2015 05/02/20 0106 05/03/20 0131 05/04/20 0154 05/05/20 0208 05/06/20 0547  WBC 24.1*   < > 23.6* 20.0* 18.8* 19.9* 16.8*  NEUTROABS 21.9*  --   --  15.9* 15.3* 17.7* 13.7*  HGB 8.5*   < > 8.7* 8.6* 8.7* 8.5* 8.1*  HCT 27.4*   < > 27.8* 27.0* 27.8* 27.4* 25.5*  MCV 103.8*   < > 101.5* 101.5* 100.0 102.6* 99.2  PLT 337   < > 323 304 261 257 264   < > = values in this interval not displayed.   Basic Metabolic Panel: Recent Labs  Lab 05/02/20 0106 05/03/20 0131 05/04/20 0154 05/05/20 0208 05/06/20 0547  NA 134* 134* 135 136 136  K 4.8 5.3* 4.9 5.1 5.2*  CL 110 110 112* 110 114*  CO2 16* 15* 15* 14* 14*  GLUCOSE 100* 141* 133* 160* 181*  BUN 46* 47* 48* 45* 46*  CREATININE 2.81* 2.74* 2.93* 2.69* 2.79*  CALCIUM 8.4* 8.2* 8.5* 8.2* 8.5*  MG  --   1.8 1.8 1.8 1.9   GFR: Estimated Creatinine Clearance: 22.7 mL/min (A) (by C-G formula based on SCr of 2.79 mg/dL (H)). Liver Function Tests: Recent Labs  Lab 05/01/20 1500 05/02/20 0106  AST 18 21  ALT 9 10  ALKPHOS 287* 301*  BILITOT 0.8 0.9  PROT 5.2* 5.3*  ALBUMIN 1.5* 1.5*   No results for input(s): LIPASE, AMYLASE in the last 168 hours. No results for input(s): AMMONIA in the last 168 hours. Coagulation Profile: Recent Labs  Lab 05/01/20 1500 05/02/20 0106  INR 2.7* 2.1*   Cardiac Enzymes: No results for input(s): CKTOTAL, CKMB, CKMBINDEX, TROPONINI in the last 168 hours. BNP (last 3 results) No results for input(s): PROBNP in the last 8760 hours. HbA1C: No results for input(s): HGBA1C in the last 72 hours. CBG: Recent Labs  Lab 05/05/20 1116 05/05/20 1821 05/05/20 2130 05/06/20 0809 05/06/20 1058  GLUCAP 153* 200* 199* 155* 167*   Lipid Profile:  No results for input(s): CHOL, HDL, LDLCALC, TRIG, CHOLHDL, LDLDIRECT in the last 72 hours. Thyroid Function Tests: No results for input(s): TSH, T4TOTAL, FREET4, T3FREE, THYROIDAB in the last 72 hours. Anemia Panel: No results for input(s): VITAMINB12, FOLATE, FERRITIN, TIBC, IRON, RETICCTPCT in the last 72 hours. Sepsis Labs: Recent Labs  Lab 05/01/20 1500 05/02/20 0106 05/05/20 0840  LATICACIDVEN 1.0 1.3 0.9    Recent Results (from the past 240 hour(s))  Blood Culture (routine x 2)     Status: None   Collection Time: 05/01/20  3:03 PM   Specimen: BLOOD  Result Value Ref Range Status   Specimen Description BLOOD BLOOD RIGHT FOREARM  Final   Special Requests   Final    BOTTLES DRAWN AEROBIC AND ANAEROBIC Blood Culture results may not be optimal due to an inadequate volume of blood received in culture bottles   Culture   Final    NO GROWTH 5 DAYS Performed at [facility], [place] [address], [place], [place] [address]    Report Status 05/06/2020 FINAL  Final  Blood Culture (routine x 2)      Status: None   Collection Time: 05/01/20  3:15 PM   Specimen: BLOOD LEFT ARM  Result Value Ref Range Status   Specimen Description BLOOD LEFT ARM  Final   Special Requests   Final    BOTTLES DRAWN AEROBIC AND ANAEROBIC Blood Culture results may not be optimal due to an inadequate volume of blood received in culture bottles   Culture   Final    NO GROWTH 5 DAYS Performed at [facility], [place] [address] [place], [place] [address]    Report Status 05/06/2020 FINAL  Final  SARS Coronavirus 2 by RT PCR (hospital order, performed in [facility] hospital lab) Nasopharyngeal Nasopharyngeal Swab     Status: None   Collection Time: 05/01/20  5:27 PM   Specimen: Nasopharyngeal Swab  Result Value Ref Range Status   SARS Coronavirus 2 NEGATIVE NEGATIVE Final    Comment: (NOTE) SARS-CoV-2 target nucleic acids are NOT DETECTED.  The SARS-CoV-2 RNA is generally detectable in upper and lower respiratory specimens during the acute phase of infection. The lowest concentration of SARS-CoV-2 viral copies this assay can detect is 250 copies / mL. A negative result does not preclude SARS-CoV-2 infection and should not be used as the sole basis for treatment or other patient management decisions.  A negative result may occur with improper specimen collection / handling, submission of specimen other than nasopharyngeal swab, presence of viral mutation(s) within the areas targeted by this assay, and inadequate number of viral copies (<250 copies / mL). A negative result must be combined with clinical observations, patient history, and epidemiological information.  Fact Sheet for Patients:   StrictlyIdeas.no  Fact Sheet for Healthcare Providers: BankingDealers.co.za  This test is not yet approved or  cleared by the [place] FDA and has been authorized for detection and/or diagnosis of SARS-CoV-2 by FDA under an Emergency Use Authorization (EUA).   This EUA will remain in effect (meaning this test can be used) for the duration of the COVID-19 declaration under Section 564(b)(1) of the Act, 21 U.S.C. section 360bbb-3(b)(1), unless the authorization is terminated or revoked sooner.  Performed at [facility], [place] [address], [place],  [address]   Urine culture     Status: Abnormal   Collection Time: 05/01/20  8:11 PM   Specimen: In/Out Cath Urine  Result Value Ref Range  Status   Specimen Description IN/OUT CATH URINE  Final   Special Requests   Final    NONE Performed at [facility], [place] [address], [place], [place] [address]    Culture MULTIPLE SPECIES PRESENT, SUGGEST RECOLLECTION (A)  Final   Report Status 05/03/2020 FINAL  Final  MRSA PCR Screening     Status: None   Collection Time: 05/01/20 11:38 PM   Specimen: Nasal Mucosa; Nasopharyngeal  Result Value Ref Range Status   MRSA by PCR NEGATIVE NEGATIVE Final    Comment:        The GeneXpert MRSA Assay (FDA approved for NASAL specimens only), is one component of a comprehensive MRSA colonization surveillance program. It is not intended to diagnose MRSA infection nor to guide or monitor treatment for MRSA infections. Performed at [facility], [place] [address], [place], [place] [address]   Culture, Urine     Status: Abnormal   Collection Time: 05/05/20  8:30 AM   Specimen: Urine, Catheterized  Result Value Ref Range Status   Specimen Description URINE, CATHETERIZED  Final   Special Requests   Final    Immunocompromised Performed at [facility], [place] [address] [place], [place] [address]    Culture MULTIPLE SPECIES PRESENT, SUGGEST RECOLLECTION (A)  Final   Report Status 05/06/2020 FINAL  Final         Radiology Studies: ECHOCARDIOGRAM COMPLETE  Result Date: 05/05/2020    ECHOCARDIOGRAM REPORT   Patient Name:   Wanda [name] Date of Exam: 05/05/2020 Medical Rec #:  [id number]         Height:       69.0 in Accession #:     [id number]        Weight:       154.5 lb Date of Birth:  02-11-1961        BSA:          1.852 m Patient Age:    14 years          BP:           99/47 mmHg Patient Gender: F                 HR:           91 bpm. Exam Location:  Inpatient Procedure: 2D Echo, Color Doppler and Cardiac Doppler Indications:    Abnormal ECG  History:        Patient has prior history of Echocardiogram examinations, most                 recent 02/08/2020. Arrythmias:Atrial Fibrillation; Risk                 Factors:Diabetes, Hypertension and Dyslipidemia. CKD.  Sonographer:    Clayton Lefort RDCS (AE) Referring Phys: [id number] [place]  1. Left ventricular ejection fraction, by estimation, is 70 to 75%. The left ventricle has hyperdynamic function. The left ventricle has no regional wall motion abnormalities. There is moderate concentric left ventricular hypertrophy. Indeterminate diastolic filling due to E-A fusion.  2. Right ventricular systolic function is normal. The right ventricular size is normal. Tricuspid regurgitation signal is inadequate for assessing PA pressure.  3. Left atrial size was moderately dilated.  4. Moderate pericardial effusion. The pericardial effusion is circumferential. There is no evidence of cardiac tamponade.  5. There is a small (8 x 2 mm) hyperechogenic mobile structure that prolapses in the LV outflow tract. This  would be a highly atypical location for a vegetation. Suspect it is a segment of redundant mitral subvalvular apparatus. The mitral valve is degenerative. No evidence of mitral valve regurgitation.  6. The aortic valve is tricuspid. Aortic valve regurgitation is not visualized. Mild to moderate aortic valve sclerosis/calcification is present, without any evidence of aortic stenosis.  7. The inferior vena cava is normal in size with greater than 50% respiratory variability, suggesting right atrial pressure of 3 mmHg. Comparison(s): No significant change from prior study. Prior images  reviewed side by side. The pericardial effusion is msaller. FINDINGS  Left Ventricle: Left ventricular ejection fraction, by estimation, is 70 to 75%. The left ventricle has hyperdynamic function. The left ventricle has no regional wall motion abnormalities. The left ventricular internal cavity size was normal in size. There is moderate concentric left ventricular hypertrophy. Indeterminate diastolic filling due to E-A fusion. Indeterminate filling pressures. Right Ventricle: The right ventricular size is normal. No increase in right ventricular wall thickness. Right ventricular systolic function is normal. Tricuspid regurgitation signal is inadequate for assessing PA pressure. Left Atrium: Left atrial size was moderately dilated. Right Atrium: Right atrial size was normal in size. Pericardium: A moderately sized pericardial effusion is present. The pericardial effusion is circumferential. There is no evidence of cardiac tamponade. Mitral Valve: There is a small (8 x 2 mm) hyperechogenic mobile structure that prolapses in the LV outflow tract. This would be a highly atypical location for a vegetation. Suspect it is a segment of redundant mitral subvalvular apparatus. The mitral valve is degenerative in appearance. There is mild thickening of the mitral valve leaflet(s). Mild to moderate mitral annular calcification. No evidence of mitral valve regurgitation. Tricuspid Valve: The tricuspid valve is normal in structure. Tricuspid valve regurgitation is trivial. Aortic Valve: The aortic valve is tricuspid. Aortic valve regurgitation is not visualized. Mild to moderate aortic valve sclerosis/calcification is present, without any evidence of aortic stenosis. Aortic valve mean gradient measures 4.0 mmHg. Aortic valve peak gradient measures 8.6 mmHg. Aortic valve area, by VTI measures 1.89 cm. Pulmonic Valve: The pulmonic valve was not well visualized. Pulmonic valve regurgitation is not visualized. Aorta: The aortic root  and ascending aorta are structurally normal, with no evidence of dilitation. Venous: The inferior vena cava is normal in size with greater than 50% respiratory variability, suggesting right atrial pressure of 3 mmHg. IAS/Shunts: No atrial level shunt detected by color flow Doppler. Additional Comments: There is pleural effusion in the left lateral region.  LEFT VENTRICLE PLAX 2D LVIDd:         3.60 cm LVIDs:         2.30 cm LV PW:         1.50 cm LV IVS:        1.50 cm LVOT diam:     2.00 cm LV SV:         54 LV SV Index:   29 LVOT Area:     3.14 cm  RIGHT VENTRICLE             IVC RV Basal diam:  2.20 cm     IVC diam: 1.20 cm RV S prime:     12.80 cm/s TAPSE (M-mode): 1.8 cm LEFT ATRIUM             Index       RIGHT ATRIUM          Index LA diam:        3.20 cm 1.73 cm/m  RA Area:     8.70 cm LA Vol (A2C):   75.0 ml 40.51 ml/m RA Volume:   16.40 ml 8.86 ml/m LA Vol (A4C):   70.2 ml 37.91 ml/m LA Biplane Vol: 75.9 ml 40.99 ml/m  AORTIC VALVE AV Area (Vmax):    2.04 cm AV Area (Vmean):   2.05 cm AV Area (VTI):     1.89 cm AV Vmax:           147.00 cm/s AV Vmean:          91.700 cm/s AV VTI:            0.286 m AV Peak Grad:      8.6 mmHg AV Mean Grad:      4.0 mmHg LVOT Vmax:         95.30 cm/s LVOT Vmean:        59.800 cm/s LVOT VTI:          0.172 m LVOT/AV VTI ratio: 0.60  AORTA Ao Root diam: 3.10 cm Ao Asc diam:  2.40 cm  SHUNTS Systemic VTI:  0.17 m Systemic Diam: 2.00 cm Dani Gobble Croitoru MD Electronically signed by Sanda Klein MD Signature Date/Time: 05/05/2020/2:45:51 PM    Final         Scheduled Meds: . (feeding supplement) PROSource Plus  30 mL Oral BID BM  . amiodarone  200 mg Oral Daily  . Chlorhexidine Gluconate Cloth  6 each Topical Daily  . darbepoetin (ARANESP) injection - NON-DIALYSIS  150 mcg Subcutaneous Q Mon-1800  . feeding supplement (ENSURE ENLIVE)  237 mL Oral TID BM  . folic acid  1 mg Oral Daily  . furosemide  40 mg Intravenous Q12H  . heparin  5,000 Units Subcutaneous  Q8H  . insulin aspart  0-15 Units Subcutaneous TID WC  . insulin aspart  0-5 Units Subcutaneous QHS  . lidocaine  5 mL Intradermal Once  . multivitamin with minerals  1 tablet Oral Daily  . nortriptyline  25 mg Oral QHS  . pantoprazole  40 mg Oral Daily  . predniSONE  5 mg Oral Q breakfast  . sodium bicarbonate  1,300 mg Oral BID  . tacrolimus  1.5 mg Oral Q12H  . thiamine  100 mg Oral Daily   Continuous Infusions: . piperacillin-tazobactam (ZOSYN)  IV 3.375 g (05/06/20 1346)   Total time spent 35 minutes  Darliss Cheney, MD [facility] 05/06/2020, 1:57 PM

## 2020-05-07 ENCOUNTER — Inpatient Hospital Stay (HOSPITAL_COMMUNITY): Payer: Medicare HMO | Admitting: Certified Registered Nurse Anesthetist

## 2020-05-07 ENCOUNTER — Encounter (HOSPITAL_COMMUNITY): Payer: Self-pay | Admitting: Internal Medicine

## 2020-05-07 ENCOUNTER — Encounter (HOSPITAL_COMMUNITY)
Admission: EM | Disposition: A | Discharge status: Skilled Nursing Facility | Payer: Self-pay | Source: Home / Self Care | Attending: Internal Medicine

## 2020-05-07 HISTORY — PX: AMPUTATION: SHX166

## 2020-05-07 LAB — CBC WITH DIFFERENTIAL/PLATELET
Abs Immature Granulocytes: 0 10*3/uL (ref 0.00–0.07)
Basophils Absolute: 0 10*3/uL (ref 0.0–0.1)
Basophils Relative: 0 %
Eosinophils Absolute: 0 10*3/uL (ref 0.0–0.5)
Eosinophils Relative: 0 %
HCT: 24.8 % — ABNORMAL LOW (ref 36.0–46.0)
Hemoglobin: 7.9 g/dL — ABNORMAL LOW (ref 12.0–15.0)
Lymphocytes Relative: 3 %
Lymphs Abs: 0.5 10*3/uL — ABNORMAL LOW (ref 0.7–4.0)
MCH: 31.7 pg (ref 26.0–34.0)
MCHC: 31.9 g/dL (ref 30.0–36.0)
MCV: 99.6 fL (ref 80.0–100.0)
Monocytes Absolute: 0.2 10*3/uL (ref 0.1–1.0)
Monocytes Relative: 1 %
Neutro Abs: 15.3 10*3/uL — ABNORMAL HIGH (ref 1.7–7.7)
Neutrophils Relative %: 96 %
Platelets: 245 10*3/uL (ref 150–400)
RBC: 2.49 MIL/uL — ABNORMAL LOW (ref 3.87–5.11)
RDW: 16.3 % — ABNORMAL HIGH (ref 11.5–15.5)
WBC: 15.9 10*3/uL — ABNORMAL HIGH (ref 4.0–10.5)
nRBC: 0 % (ref 0.0–0.2)
nRBC: 0 /100 WBC

## 2020-05-07 LAB — C-REACTIVE PROTEIN: CRP: 6.4 mg/dL — ABNORMAL HIGH (ref <1.0)

## 2020-05-07 LAB — BASIC METABOLIC PANEL
Anion gap: 10 (ref 5–15)
BUN: 52 mg/dL — ABNORMAL HIGH (ref 6–20)
CO2: 15 mmol/L — ABNORMAL LOW (ref 22–32)
Calcium: 8.6 mg/dL — ABNORMAL LOW (ref 8.9–10.3)
Chloride: 113 mmol/L — ABNORMAL HIGH (ref 98–111)
Creatinine, Ser: 2.88 mg/dL — ABNORMAL HIGH (ref 0.44–1.00)
GFR calc Af Amer: 20 mL/min — ABNORMAL LOW (ref >60)
GFR calc non Af Amer: 17 mL/min — ABNORMAL LOW (ref >60)
Glucose, Bld: 126 mg/dL — ABNORMAL HIGH (ref 70–99)
Potassium: 5 mmol/L (ref 3.5–5.1)
Sodium: 138 mmol/L (ref 135–145)

## 2020-05-07 LAB — HEMOGLOBIN AND HEMATOCRIT, BLOOD
HCT: 28.2 % — ABNORMAL LOW (ref 36.0–46.0)
Hemoglobin: 9.2 g/dL — ABNORMAL LOW (ref 12.0–15.0)

## 2020-05-07 LAB — GLUCOSE, CAPILLARY
Glucose-Capillary: 122 mg/dL — ABNORMAL HIGH (ref 70–99)
Glucose-Capillary: 118 mg/dL — ABNORMAL HIGH (ref 70–99)
Glucose-Capillary: 91 mg/dL (ref 70–99)
Glucose-Capillary: 138 mg/dL — ABNORMAL HIGH (ref 70–99)

## 2020-05-07 LAB — PREPARE RBC (CROSSMATCH)

## 2020-05-07 LAB — MAGNESIUM: Magnesium: 1.8 mg/dL (ref 1.7–2.4)

## 2020-05-07 SURGERY — AMPUTATION BELOW KNEE
Anesthesia: General | Site: Knee | Laterality: Right

## 2020-05-07 MED ORDER — SUCCINYLCHOLINE CHLORIDE 200 MG/10ML IV SOSY
PREFILLED_SYRINGE | INTRAVENOUS | Status: AC
  Filled 2020-05-07: qty 10

## 2020-05-07 MED ORDER — PHENYLEPHRINE 40 MCG/ML (10ML) SYRINGE FOR IV PUSH (FOR BLOOD PRESSURE SUPPORT)
PREFILLED_SYRINGE | INTRAVENOUS | Status: AC
  Filled 2020-05-07: qty 10

## 2020-05-07 MED ORDER — ZINC SULFATE 220 (50 ZN) MG PO CAPS
220.0000 mg | ORAL_CAPSULE | Freq: Every day | ORAL | Status: DC
  Administered 2020-05-08 – 2020-06-01 (×25): 220 mg via ORAL
  Filled 2020-05-07 (×24): qty 1

## 2020-05-07 MED ORDER — FENTANYL CITRATE (PF) 250 MCG/5ML IJ SOLN
INTRAMUSCULAR | Status: AC
  Filled 2020-05-07: qty 5

## 2020-05-07 MED ORDER — LIDOCAINE 2% (20 MG/ML) 5 ML SYRINGE
INTRAMUSCULAR | Status: AC
  Filled 2020-05-07: qty 5

## 2020-05-07 MED ORDER — SODIUM CHLORIDE 0.9% IV SOLUTION: Freq: Once | INTRAVENOUS | Status: AC

## 2020-05-07 MED ORDER — ROPIVACAINE HCL 7.5 MG/ML IJ SOLN: INTRAMUSCULAR | Status: DC | PRN

## 2020-05-07 MED ORDER — FENTANYL CITRATE (PF) 100 MCG/2ML IJ SOLN: 50.0000 ug | Freq: Once | INTRAMUSCULAR | Status: AC

## 2020-05-07 MED ORDER — DOCUSATE SODIUM 100 MG PO CAPS
100.0000 mg | ORAL_CAPSULE | Freq: Two times a day (BID) | ORAL | Status: DC
  Administered 2020-05-07 – 2020-05-08 (×2): 100 mg via ORAL
  Filled 2020-05-07 (×2): qty 1

## 2020-05-07 MED ORDER — VASOPRESSIN 20 UNIT/ML IV SOLN
INTRAVENOUS | Status: DC | PRN
  Administered 2020-05-07: 2 [IU] via INTRAVENOUS
  Administered 2020-05-07: 1 [IU] via INTRAVENOUS

## 2020-05-07 MED ORDER — MIDAZOLAM HCL 2 MG/2ML IJ SOLN
INTRAMUSCULAR | Status: AC
  Filled 2020-05-07: qty 2

## 2020-05-07 MED ORDER — PROPOFOL 10 MG/ML IV BOLUS
INTRAVENOUS | Status: AC
  Filled 2020-05-07: qty 20

## 2020-05-07 MED ORDER — 0.9 % SODIUM CHLORIDE (POUR BTL) OPTIME
TOPICAL | Status: DC | PRN
  Administered 2020-05-07: 1000 mL

## 2020-05-07 MED ORDER — MORPHINE SULFATE (PF) 2 MG/ML IV SOLN
0.5000 mg | INTRAVENOUS | Status: DC | PRN
  Administered 2020-05-07: 0.5 mg via INTRAVENOUS
  Administered 2020-05-08 – 2020-05-15 (×12): 1 mg via INTRAVENOUS
  Filled 2020-05-07 (×13): qty 1

## 2020-05-07 MED ORDER — PHENYLEPHRINE HCL-NACL 10-0.9 MG/250ML-% IV SOLN
INTRAVENOUS | Status: DC | PRN
  Administered 2020-05-07: 50 ug/min via INTRAVENOUS

## 2020-05-07 MED ORDER — VANCOMYCIN HCL IN DEXTROSE 1-5 GM/200ML-% IV SOLN
1000.0000 mg | INTRAVENOUS | Status: AC
  Administered 2020-05-07: 1000 mg via INTRAVENOUS
  Filled 2020-05-07: qty 200

## 2020-05-07 MED ORDER — LIDOCAINE HCL (CARDIAC) PF 100 MG/5ML IV SOSY
PREFILLED_SYRINGE | INTRAVENOUS | Status: DC | PRN
  Administered 2020-05-07: 100 mg via INTRAVENOUS

## 2020-05-07 MED ORDER — CHLORHEXIDINE GLUCONATE 0.12 % MT SOLN
OROMUCOSAL | Status: AC
  Filled 2020-05-07: qty 15

## 2020-05-07 MED ORDER — FENTANYL CITRATE (PF) 100 MCG/2ML IJ SOLN
INTRAMUSCULAR | Status: AC
  Administered 2020-05-07: 50 ug via INTRAVENOUS
  Filled 2020-05-07: qty 2

## 2020-05-07 MED ORDER — METOCLOPRAMIDE HCL 5 MG/ML IJ SOLN: 5.0000 mg | Freq: Three times a day (TID) | INTRAMUSCULAR | Status: DC | PRN

## 2020-05-07 MED ORDER — POVIDONE-IODINE 10 % EX SWAB: 2.0000 "application " | Freq: Once | CUTANEOUS | Status: DC

## 2020-05-07 MED ORDER — TACROLIMUS 1 MG PO CAPS
1.0000 mg | ORAL_CAPSULE | Freq: Two times a day (BID) | ORAL | Status: DC
  Administered 2020-05-07 – 2020-05-19 (×24): 1 mg via ORAL
  Filled 2020-05-07 (×27): qty 1

## 2020-05-07 MED ORDER — VASOPRESSIN 20 UNIT/ML IV SOLN
INTRAVENOUS | Status: AC
  Filled 2020-05-07: qty 1

## 2020-05-07 MED ORDER — CHLORHEXIDINE GLUCONATE 4 % EX LIQD
60.0000 mL | Freq: Once | CUTANEOUS | Status: DC
  Filled 2020-05-07: qty 60

## 2020-05-07 MED ORDER — ROPIVACAINE HCL 7.5 MG/ML IJ SOLN
INTRAMUSCULAR | Status: DC | PRN
Start: 2020-05-07 — End: 2020-05-07
  Administered 2020-05-07: 25 mL via PERINEURAL
  Administered 2020-05-07: 15 mL via PERINEURAL

## 2020-05-07 MED ORDER — ROCURONIUM BROMIDE 10 MG/ML (PF) SYRINGE
PREFILLED_SYRINGE | INTRAVENOUS | Status: AC
  Filled 2020-05-07: qty 10

## 2020-05-07 MED ORDER — SENNOSIDES-DOCUSATE SODIUM 8.6-50 MG PO TABS
1.0000 | ORAL_TABLET | Freq: Every day | ORAL | Status: DC
  Administered 2020-05-08 – 2020-06-01 (×6): 1 via ORAL
  Filled 2020-05-07 (×16): qty 1

## 2020-05-07 MED ORDER — ALBUMIN HUMAN 5 % IV SOLN: INTRAVENOUS | Status: DC | PRN

## 2020-05-07 MED ORDER — DEXAMETHASONE SODIUM PHOSPHATE 10 MG/ML IJ SOLN
INTRAMUSCULAR | Status: AC
  Filled 2020-05-07: qty 2

## 2020-05-07 MED ORDER — SODIUM CHLORIDE 0.9% IV SOLUTION
Freq: Once | INTRAVENOUS | Status: DC
Start: 2020-05-07 — End: 2020-05-07

## 2020-05-07 MED ORDER — PHENYLEPHRINE HCL (PRESSORS) 10 MG/ML IV SOLN
INTRAVENOUS | Status: DC | PRN
  Administered 2020-05-07: 80 ug via INTRAVENOUS
  Administered 2020-05-07: 40 ug via INTRAVENOUS

## 2020-05-07 MED ORDER — METOCLOPRAMIDE HCL 5 MG PO TABS
5.0000 mg | ORAL_TABLET | Freq: Three times a day (TID) | ORAL | Status: DC | PRN
  Administered 2020-05-10: 10 mg via ORAL
  Administered 2020-05-10: 5 mg via ORAL
  Filled 2020-05-07 (×3): qty 2

## 2020-05-07 MED ORDER — PROPOFOL 10 MG/ML IV BOLUS
INTRAVENOUS | Status: DC | PRN
  Administered 2020-05-07: 70 mg via INTRAVENOUS

## 2020-05-07 MED ORDER — ONDANSETRON HCL 4 MG/2ML IJ SOLN
INTRAMUSCULAR | Status: DC | PRN
  Administered 2020-05-07: 4 mg via INTRAVENOUS

## 2020-05-07 MED ORDER — ONDANSETRON HCL 4 MG/2ML IJ SOLN
INTRAMUSCULAR | Status: AC
  Filled 2020-05-07: qty 2

## 2020-05-07 MED ORDER — SODIUM CHLORIDE 0.9 % IV SOLN
INTRAVENOUS | Status: DC | PRN
Start: 2020-05-07 — End: 2020-05-07

## 2020-05-07 MED ORDER — EPHEDRINE 5 MG/ML INJ
INTRAVENOUS | Status: AC
  Filled 2020-05-07: qty 10

## 2020-05-07 MED ORDER — ASCORBIC ACID 500 MG PO TABS
250.0000 mg | ORAL_TABLET | Freq: Two times a day (BID) | ORAL | Status: DC
  Administered 2020-05-07 – 2020-06-01 (×50): 250 mg via ORAL
  Filled 2020-05-07 (×52): qty 1

## 2020-05-07 SURGICAL SUPPLY — 57 items
APL PRP STRL LF DISP 70% ISPRP (MISCELLANEOUS) ×1
BANDAGE ESMARK 6X9 LF (GAUZE/BANDAGES/DRESSINGS) IMPLANT
BLADE SAW RECIP 87.9 MT (BLADE) ×2 IMPLANT
BNDG CMPR 9X6 STRL LF SNTH (GAUZE/BANDAGES/DRESSINGS)
BNDG CMPR MED 10X6 ELC LF (GAUZE/BANDAGES/DRESSINGS) ×1
BNDG COHESIVE 6X5 TAN STRL LF (GAUZE/BANDAGES/DRESSINGS) ×2 IMPLANT
BNDG ELASTIC 6X10 VLCR STRL LF (GAUZE/BANDAGES/DRESSINGS) ×1 IMPLANT
BNDG ELASTIC 6X5.8 VLCR STR LF (GAUZE/BANDAGES/DRESSINGS) IMPLANT
BNDG ESMARK 6X9 LF (GAUZE/BANDAGES/DRESSINGS)
CHLORAPREP W/TINT 26 (MISCELLANEOUS) ×2 IMPLANT
COVER SURGICAL LIGHT HANDLE (MISCELLANEOUS) ×2 IMPLANT
COVER WAND RF STERILE (DRAPES) ×1 IMPLANT
CUFF TOURN SGL QUICK 34 (TOURNIQUET CUFF) ×2
CUFF TOURN SGL QUICK 42 (TOURNIQUET CUFF) IMPLANT
CUFF TRNQT CYL 34X4.125X (TOURNIQUET CUFF) ×1 IMPLANT
DRAPE INCISE IOBAN 66X45 STRL (DRAPES) ×2 IMPLANT
DRAPE U-SHAPE 47X51 STRL (DRAPES) ×4 IMPLANT
DRSG MEPITEL 4X7.2 (GAUZE/BANDAGES/DRESSINGS) ×2 IMPLANT
DRSG PAD ABDOMINAL 8X10 ST (GAUZE/BANDAGES/DRESSINGS) ×3 IMPLANT
ELECT CAUTERY BLADE 6.4 (BLADE) IMPLANT
ELECT REM PT RETURN 9FT ADLT (ELECTROSURGICAL) ×2
ELECTRODE REM PT RTRN 9FT ADLT (ELECTROSURGICAL) ×1 IMPLANT
EVACUATOR 1/8 PVC DRAIN (DRAIN) IMPLANT
GAUZE SPONGE 4X4 12PLY STRL (GAUZE/BANDAGES/DRESSINGS) ×2 IMPLANT
GLOVE BIO SURGEON STRL SZ8 (GLOVE) ×2 IMPLANT
GLOVE BIOGEL PI IND STRL 8 (GLOVE) ×2 IMPLANT
GLOVE BIOGEL PI INDICATOR 8 (GLOVE) ×2
GLOVE ECLIPSE 8.0 STRL XLNG CF (GLOVE) ×4 IMPLANT
GOWN STRL REUS W/ TWL LRG LVL3 (GOWN DISPOSABLE) ×1 IMPLANT
GOWN STRL REUS W/ TWL XL LVL3 (GOWN DISPOSABLE) ×1 IMPLANT
GOWN STRL REUS W/TWL LRG LVL3 (GOWN DISPOSABLE) ×2
GOWN STRL REUS W/TWL XL LVL3 (GOWN DISPOSABLE) ×2
KIT BASIN OR (CUSTOM PROCEDURE TRAY) ×2 IMPLANT
KIT TURNOVER KIT B (KITS) ×2 IMPLANT
MANIFOLD NEPTUNE II (INSTRUMENTS) ×1 IMPLANT
NS IRRIG 1000ML POUR BTL (IV SOLUTION) ×2 IMPLANT
PACK GENERAL/GYN (CUSTOM PROCEDURE TRAY) ×2 IMPLANT
PAD ABD 8X10 STRL (GAUZE/BANDAGES/DRESSINGS) ×1 IMPLANT
PAD ARMBOARD 7.5X6 YLW CONV (MISCELLANEOUS) ×4 IMPLANT
PAD CAST 4YDX4 CTTN HI CHSV (CAST SUPPLIES) ×1 IMPLANT
PADDING CAST COTTON 4X4 STRL (CAST SUPPLIES)
PADDING CAST COTTON 6X4 STRL (CAST SUPPLIES) ×1 IMPLANT
SPONGE LAP 18X18 RF (DISPOSABLE) ×3 IMPLANT
STAPLER VISISTAT 35W (STAPLE) ×2 IMPLANT
STOCKINETTE IMPERVIOUS LG (DRAPES) IMPLANT
SUT ETHILON 2 0 FS 18 (SUTURE) ×2 IMPLANT
SUT MNCRL AB 3-0 PS2 18 (SUTURE) ×3 IMPLANT
SUT PDS AB 1 CT  36 (SUTURE) ×2
SUT PDS AB 1 CT 36 (SUTURE) ×2 IMPLANT
SUT SILK 0 FSL (SUTURE) ×1 IMPLANT
SUT SILK 2 0 (SUTURE) ×2
SUT SILK 2-0 18XBRD TIE 12 (SUTURE) ×1 IMPLANT
SWAB COLLECTION DEVICE MRSA (MISCELLANEOUS) IMPLANT
SWAB CULTURE ESWAB REG 1ML (MISCELLANEOUS) IMPLANT
TOWEL GREEN STERILE (TOWEL DISPOSABLE) ×2 IMPLANT
TOWEL GREEN STERILE FF (TOWEL DISPOSABLE) ×2 IMPLANT
WATER STERILE IRR 1000ML POUR (IV SOLUTION) ×2 IMPLANT

## 2020-05-07 NOTE — Anesthesia Procedure Notes (Signed)
Procedure Name: LMA Insertion Date/Time: 05/07/2020 2:41 PM Performed by: Shirlyn Goltz, CRNA Pre-anesthesia Checklist: Patient identified, Emergency Drugs available, Suction available and Patient being monitored Patient Re-evaluated:Patient Re-evaluated prior to induction Oxygen Delivery Method: Circle system utilized Preoxygenation: Pre-oxygenation with 100% oxygen Induction Type: IV induction Ventilation: Mask ventilation without difficulty LMA: LMA inserted LMA Size: 4.0 Number of attempts: 1 Placement Confirmation: positive ETCO2 and breath sounds checked- equal and bilateral Tube secured with: Tape Dental Injury: Teeth and Oropharynx as per pre-operative assessment

## 2020-05-07 NOTE — Anesthesia Preprocedure Evaluation (Signed)
Anesthesia Evaluation    Reviewed: Allergy & Precautions, H&P , Unable to perform ROS - Chart review only  History of Anesthesia Complications Negative for: history of anesthetic complications  Airway Mallampati: I  TM Distance: >3 FB Neck ROM: Full    Dental  (+) Dental Advisory Given, Poor Dentition,    Pulmonary shortness of breath, neg sleep apnea, pneumonia,    breath sounds clear to auscultation       Cardiovascular hypertension, Pt. on medications + Peripheral Vascular Disease   Rhythm:regular Rate:Normal  Echo 2021 1. Left ventricular ejection fraction, by estimation, is 70 to 75%. The left ventricle has hyperdynamic function. The left ventricle has no regional wall motion abnormalities. There is moderate concentric left ventricular hypertrophy. Indeterminate diastolic filling due to E-A fusion.  2. Right ventricular systolic function is normal. The right ventricular size is normal. Tricuspid regurgitation signal is inadequate for assessing PA pressure.  3. Left atrial size was moderately dilated.  4. Moderate pericardial effusion. The pericardial effusion is circumferential. There is no evidence of cardiac tamponade.  5. There is a small (8 x 2 mm) hyperechogenic mobile structure that prolapses in the LV outflow tract. This would be a highly atypical location for a vegetation. Suspect it is a segment of redundant mitral subvalvular apparatus. The mitral valve is degenerative. No evidence of mitral valve regurgitation.  6. The aortic valve is tricuspid. Aortic valve regurgitation is not visualized. Mild to moderate aortic valve sclerosis/calcification is present, without any evidence of aortic stenosis.  7. The inferior vena cava is normal in size with greater than 50% respiratory variability, suggesting right atrial pressure of 3 mmHg.    Neuro/Psych  Headaches, PSYCHIATRIC DISORDERS Depression  Neuromuscular disease CVA      GI/Hepatic GERD  ,  Endo/Other  diabetesHypothyroidism   Renal/GU CRF, ESRF and DialysisRenal diseaseConverting from peritoneal to IV HD, HD MWF     Musculoskeletal  (+) Arthritis ,   Abdominal   Peds  Hematology  (+) anemia ,   Anesthesia Other Findings   Reproductive/Obstetrics                             Anesthesia Physical  Anesthesia Plan  ASA: III  Anesthesia Plan: General   Post-op Pain Management: GA combined w/ Regional for post-op pain   Induction: Intravenous  PONV Risk Score and Plan: 3 and Ondansetron, Dexamethasone and Treatment may vary due to age or medical condition  Airway Management Planned: LMA  Additional Equipment: None  Intra-op Plan:   Post-operative Plan: Extubation in OR  Informed Consent:   Plan Discussed with:   Anesthesia Plan Comments:         Anesthesia Quick Evaluation

## 2020-05-07 NOTE — Transfer of Care (Signed)
Immediate Anesthesia Transfer of Care Note  Patient: Wanda [name]  Procedure(s) Performed: AMPUTATION BELOW KNEE (Right Knee)  Patient Location: PACU  Anesthesia Type:General  Level of Consciousness: awake, alert , oriented and patient cooperative  Airway & Oxygen Therapy: Patient Spontanous Breathing  Post-op Assessment: Report given to RN and Post -op Vital signs reviewed and stable  Post vital signs: Reviewed and stable  BABP 98/45; see abg results H&H 16/5.4; 2 units of blood already ordered. RN in [place] notified that patient still needs blood transfusion.  Dr. Lissa Hoard aware.   Last Vitals:  Vitals Value Taken Time  BP    Temp 36.3 C 05/07/20 1556  Pulse    Resp 11 05/07/20 1600  SpO2 100 % 05/07/20 1556  Vitals shown include unvalidated device data.  Last Pain:  Vitals:   05/07/20 1200  TempSrc:   PainSc: 0-No pain      Patients Stated Pain Goal: 0 (63/78/58 8502)  Complications: No complications documented.

## 2020-05-07 NOTE — Anesthesia Procedure Notes (Addendum)
Anesthesia Regional Block: Popliteal block   Pre-Anesthetic Checklist: ,, timeout performed, Correct Patient, Correct Site, Correct Laterality, Correct Procedure, Correct Position, site marked, Risks and benefits discussed,  Surgical consent,  Pre-op evaluation,  At surgeon's request and post-op pain management  Laterality: Right  Prep: chloraprep       Needles:  Injection technique: Single-shot  Needle Type: Stimiplex     Needle Length: 10cm  Needle Gauge: 21     Additional Needles:   Procedures:,,,, ultrasound used (permanent image in chart),,,,  Motor weakness within 5 minutes.   Nerve Stimulator or Paresthesia:  Response: 0.5 mA,   Additional Responses:   Narrative:  Start time: 05/07/2020 2:14 PM End time: 05/07/2020 2:19 PM Injection made incrementally with aspirations every 5 mL.  Performed by: Personally  Anesthesiologist: Nolon Nations, MD  Additional Notes: Nerve located and needle positioned with direct ultrasound guidance. Good perineural spread. Patient tolerated well.

## 2020-05-07 NOTE — Op Note (Signed)
05/07/2020  3:44 PM  PATIENT:  Wanda [name]  59 y.o. female  PRE-OPERATIVE DIAGNOSIS:  Right foot diabetic ulcer and gangrene  POST-OPERATIVE DIAGNOSIS:  Same  Procedure(s):  Right below knee amputation  SURGEON:  [name], MD  ASSISTANT: none  ANESTHESIA:   General, regional  EBL:  300 cc  TOURNIQUET:   Total Tourniquet Time Documented: Thigh (Right) - 14 minutes Total: Thigh (Right) - 14 minutes  COMPLICATIONS:  None apparent  DISPOSITION:  Extubated, awake and stable to recovery.  INDICATION FOR PROCEDURE: The patient is a 59 year old female with a past medical history significant for diabetes and chronic kidney disease status post renal transplant.  She has developed a severe diabetic foot ulcer involving her right heel.  All of the skin around her right heel is necrotic with gangrenous changes.  She has failed treatment with local wound care and antibiotics.  She presents now for right below-knee amputation.  She and her family understand the risks and benefits of the alternative treatment options and elect surgical treatment.  They specifically understand risks of bleeding, infection, nerve damage, blood clots, revision amputation and death.  PROCEDURE IN DETAIL: After preoperative consent was obtained and the correct operative site was identified, the patient was brought to the operating room and placed supine on the operating table.  General anesthesia was administered following regional anesthesia.  Preoperative antibiotics were administered.  A surgical timeout was taken.  The right lower extremity was prepped and draped in standard sterile fashion with a tourniquet around the thigh.  The extremity was exsanguinated and the tourniquet was inflated to 350 mmHg.  A posterior flap incision was made around the leg 15 cm distal from the medial joint line.  Sharp dissection was carried down through the subcutaneous tissues and the anterior compartment musculature.  The  periosteum of the tibia was elevated and the tibia was cut with a reciprocating saw approximately 1 cm proximal from the skin incision.  The fibula was dissected and cut 1 cm proximal from the tibial cut.  The lower leg was then retracted anteriorly and an amputation knife was used to cut through the posterior soft tissues creating a beveled flap.  The leg was passed off the field as a specimen to pathology.  The named vessels were ligated with 2-0 and 0 silk ties.  The tourniquet was released and hemostasis was achieved.  Named nerves were transected while held on gentle traction.  The wound was irrigated after beveling the tibial cut with a rasp.  The gastrocnemius fascia was repaired to the anterior tibial periosteum with imbricating sutures of 0 PDS deep subcutaneous tissues were approximated with 0 PDS.  Superficial subcutaneous tissues were approximated with inverted simple sutures of 3-0 Monocryl.  The skin incision was closed with staples.  Sterile dressings were applied followed by compression wrap.  The patient was awakened from anesthesia and transported to the recovery room in stable condition.   FOLLOW UP PLAN: Nonweightbearing on the right lower extremity.  Stump protector.  Begin physical therapy.  DVT prophylaxis per the primary team.

## 2020-05-07 NOTE — Progress Notes (Signed)
Nutrition Follow-up  DOCUMENTATION CODES:   Severe malnutrition in context of chronic illness  INTERVENTION:   If po intake does not improve, recommend considering Cortrak insertion with initiation of supplemental TF to promote wound and post-op healing in setting of malnutrition  Recommend downgrading diet to Dysphagia 3, easy to chew  Increase IEnsure Enlive po to TID, each supplement provides 350 kcal and 20 grams of protein  Continue 30 ml ProSource Plus BID, each supplement provides 100 kcals and 15 grams protein.   Add Vit C 250 mg BID, Zinc 220 mg BID for 30 days for wound healing  NUTRITION DIAGNOSIS:   Severe Malnutrition related to chronic illness as evidenced by moderate fat depletion, severe muscle depletion, percent weight loss.  Being addressed via supplements  GOAL:   Patient will meet greater than or equal to 90% of their needs  Progressing  MONITOR:   PO intake, Supplement acceptance, Labs, Weight trends, Skin  REASON FOR ASSESSMENT:   Consult Assessment of nutrition requirement/status  ASSESSMENT:   59 year old female who presented on 7/30 with acute worsening of right foot wound. PMH of anemia, ESRD s/p failed kidney transplant in 2013 and repeat transplatnt in September 2020 with current CKD stage IV, T2DM, HLD, PAD, bariatric surgery (gastric sleeve). Pt admitted with sepsis.   Plan is for R. AKA today  Pt has poor dentition; no upper teeth, few teeth on bottom. Pt does not have dentures. Pt reports difficulty chewing meats, salads, etc given poor dentition; discussed downgrading diet to easy to chew and pt agreeable.   Recorded po intake only 10-50% of meals; <25% on average  Pt likes the Ensure Enlive and drinking well. Reinforce importance of adequate nutrition for wound and post-op healing.   Severe depletions in LE can be explained by bed bound status; however pt with wasting in many other areas likely related to malnutrition  Pt hast  lost significant amount of weight in the past 1-2 years; 28% wt loss. Noted pt with previous dx with malnutrition. Current weight is lowest weight on record per weight encounters.True dry weight likely lower given edema present on exam.    Labs: potassium 5.0 (wdl), CBGs 91-228, Creatinine 2.88, BUN 52 Meds: thiamine, sodium bicarb, prograf, prednisone, MVI with Minerals, folic acid, aranesp  NUTRITION - FOCUSED PHYSICAL EXAM:    Most Recent Value  Orbital Region Mild depletion  Upper Arm Region Unable to assess  Thoracic and Lumbar Region Severe depletion  Buccal Region Mild depletion  Temple Region Severe depletion  Clavicle Bone Region Severe depletion  Clavicle and Acromion Bone Region Severe depletion  Scapular Bone Region Severe depletion  Dorsal Hand Severe depletion  Patellar Region Severe depletion  Anterior Thigh Region Severe depletion  Posterior Calf Region Severe depletion  Edema (RD Assessment) Moderate  Hair --  [brittle, hair loss]       Diet Order:  REGULAR  EDUCATION NEEDS:   Not appropriate for education at this time  Skin:  Skin Assessment: Skin Integrity Issues: Skin Integrity Issues:: Other (Comment), Unstageable, Stage II Stage II: sacrum, right buttocks, left buttocks Unstageable: left heel, right heel Other: non-pressure wounds to right toe and left toe, cellulitis to right foot  Last BM:  8/1  Height:   Ht Readings from Last 1 Encounters:  05/01/20 5\' 9"  (1.753 m)    Weight:   Wt Readings from Last 1 Encounters:  05/01/20 70.1 kg    Ideal Body Weight:  65.9 kg  BMI:  Body mass  index is 22.82 kg/m.  Estimated Nutritional Needs:   Kcal:  2100-2400 kcals  Protein:  110-130 grams  Fluid:  >/= 2.0 L   Kerman Passey MS, RDN, LDN, CNSC Registered Dietitian III Clinical Nutrition RD Pager and On-Call Pager Number Located in [place]

## 2020-05-07 NOTE — Anesthesia Procedure Notes (Addendum)
Anesthesia Regional Block: Adductor canal block   Pre-Anesthetic Checklist: ,, timeout performed, Correct Patient, Correct Site, Correct Laterality, Correct Procedure, Correct Position, site marked, Risks and benefits discussed,  Surgical consent,  Pre-op evaluation,  At surgeon's request and post-op pain management  Laterality: Right  Prep: chloraprep       Needles:  Injection technique: Single-shot  Needle Type: Stimiplex     Needle Length: 9cm  Needle Gauge: 21     Additional Needles:   Procedures:,,,, ultrasound used (permanent image in chart),,,,  Narrative:  Start time: 05/07/2020 2:10 PM End time: 05/07/2020 2:14 PM Injection made incrementally with aspirations every 5 mL.  Performed by: Personally  Anesthesiologist: Nolon Nations, MD  Additional Notes: BP cuff, EKG monitors applied. Sedation begun. Artery and nerve location verified with U/S and anesthetic injected incrementally, slowly, and after negative aspirations under direct u/s guidance. Good fascial /perineural spread. Tolerated well.

## 2020-05-07 NOTE — Progress Notes (Signed)
Subjective:  1200 UOP- crt pretty stable "they are taking that leg off today but its OK"  Objective Vital signs in last 24 hours: Vitals:   05/07/20 0500 05/07/20 0600 05/07/20 0700 05/07/20 0748  BP:      Pulse: 90 97 98   Resp: 15 16 16    Temp:    98.5 F (36.9 C)  TempSrc:    Oral  SpO2: 100% 100% 100%   Weight:      Height:       Weight change:   Intake/Output Summary (Last 24 hours) at 05/07/2020 4580 Last data filed at 05/07/2020 0530 Gross per 24 hour  Intake --  Output 1275 ml  Net -1275 ml    Assessment/ Plan: Pt is a 59 y.o. yo female with DM, HTN, Afib, s/p renal transplant who was admitted on 05/01/2020 with worsening drainage from right foot ulcer  Assessment/Plan: 1. S/p renal transplant-  Second transplant at [facility] in September of 2020-  C/b delayed graft function.  Allograft function is not great with crt of around 3 - no acute change with hospitalization. 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  Some controversy regarding prograf dose- put on what we think -  level OK.  UOP not robust but crt staying poor but stable  2. Foot ulcer drainage-  Imaging not showing overt osteo.  On zosyn -  Per primary team -  Now for BKA 3. Anemia- fairly advanced, likely due to CKD-   giving ESA- no iron due to infection 4. HTN/volume- hypotension,  Proven to not be an issue once a line placed. All parameters in place to raise BP have been stopped.  Will continue lasix which will help her potassium as well  5. FTT-  Therapies in place,  Albumin of 1.5 gives poor prognosis - on prosource  6. Metabolic acidosis-   oral bicarb - have inc dose  [name]    Labs: Basic Metabolic Panel: Recent Labs  Lab 05/05/20 0208 05/06/20 0547 05/07/20 0559  NA 136 136 138  K 5.1 5.2* 5.0  CL 110 114* 113*  CO2 14* 14* 15*  GLUCOSE 160* 181* 126*  BUN 45* 46* 52*  CREATININE 2.69* 2.79* 2.88*  CALCIUM 8.2* 8.5* 8.6*   Liver Function Tests: Recent  Labs  Lab 05/01/20 1500 05/02/20 0106  AST 18 21  ALT 9 10  ALKPHOS 287* 301*  BILITOT 0.8 0.9  PROT 5.2* 5.3*  ALBUMIN 1.5* 1.5*   No results for input(s): LIPASE, AMYLASE in the last 168 hours. No results for input(s): AMMONIA in the last 168 hours. CBC: Recent Labs  Lab 05/03/20 0131 05/03/20 0131 05/04/20 0154 05/04/20 0154 05/05/20 0208 05/06/20 0547 05/07/20 0559  WBC 20.0*   < > 18.8*   < > 19.9* 16.8* 15.9*  NEUTROABS 15.9*   < > 15.3*   < > 17.7* 13.7* 15.3*  HGB 8.6*   < > 8.7*   < > 8.5* 8.1* 7.9*  HCT 27.0*   < > 27.8*   < > 27.4* 25.5* 24.8*  MCV 101.5*  --  100.0  --  102.6* 99.2 99.6  PLT 304   < > 261   < > 257 264 245   < > = values in this interval not displayed.   Cardiac Enzymes: No results for input(s): CKTOTAL, CKMB, CKMBINDEX, TROPONINI in the last 168 hours. CBG: Recent Labs  Lab 05/06/20 1058 05/06/20 1538 05/06/20 1848  05/06/20 2125 05/07/20 0746  GLUCAP 167* 181* 185* 228* 122*    Iron Studies: No results for input(s): IRON, TIBC, TRANSFERRIN, FERRITIN in the last 72 hours. Studies/Results: ECHOCARDIOGRAM COMPLETE  Result Date: 05/05/2020    ECHOCARDIOGRAM REPORT   Patient Name:   Wanda [name] Date of Exam: 05/05/2020 Medical Rec #:  [id number]         Height:       69.0 in Accession #:    [id number]        Weight:       154.5 lb Date of Birth:  1960-11-28        BSA:          1.852 m Patient Age:    59 years          BP:           99/47 mmHg Patient Gender: F                 HR:           91 bpm. Exam Location:  Inpatient Procedure: 2D Echo, Color Doppler and Cardiac Doppler Indications:    Abnormal ECG  History:        Patient has prior history of Echocardiogram examinations, most                 recent 02/08/2020. Arrythmias:Atrial Fibrillation; Risk                 Factors:Diabetes, Hypertension and Dyslipidemia. CKD.  Sonographer:    Clayton Lefort RDCS (AE) Referring Phys: [id number] [place]  1. Left ventricular ejection  fraction, by estimation, is 70 to 75%. The left ventricle has hyperdynamic function. The left ventricle has no regional wall motion abnormalities. There is moderate concentric left ventricular hypertrophy. Indeterminate diastolic filling due to E-A fusion.  2. Right ventricular systolic function is normal. The right ventricular size is normal. Tricuspid regurgitation signal is inadequate for assessing PA pressure.  3. Left atrial size was moderately dilated.  4. Moderate pericardial effusion. The pericardial effusion is circumferential. There is no evidence of cardiac tamponade.  5. There is a small (8 x 2 mm) hyperechogenic mobile structure that prolapses in the LV outflow tract. This would be a highly atypical location for a vegetation. Suspect it is a segment of redundant mitral subvalvular apparatus. The mitral valve is degenerative. No evidence of mitral valve regurgitation.  6. The aortic valve is tricuspid. Aortic valve regurgitation is not visualized. Mild to moderate aortic valve sclerosis/calcification is present, without any evidence of aortic stenosis.  7. The inferior vena cava is normal in size with greater than 50% respiratory variability, suggesting right atrial pressure of 3 mmHg. Comparison(s): No significant change from prior study. Prior images reviewed side by side. The pericardial effusion is msaller. FINDINGS  Left Ventricle: Left ventricular ejection fraction, by estimation, is 70 to 75%. The left ventricle has hyperdynamic function. The left ventricle has no regional wall motion abnormalities. The left ventricular internal cavity size was normal in size. There is moderate concentric left ventricular hypertrophy. Indeterminate diastolic filling due to E-A fusion. Indeterminate filling pressures. Right Ventricle: The right ventricular size is normal. No increase in right ventricular wall thickness. Right ventricular systolic function is normal. Tricuspid regurgitation signal is inadequate for  assessing PA pressure. Left Atrium: Left atrial size was moderately dilated. Right Atrium: Right atrial size was normal in size. Pericardium: A moderately sized pericardial effusion is present. The pericardial effusion  is circumferential. There is no evidence of cardiac tamponade. Mitral Valve: There is a small (8 x 2 mm) hyperechogenic mobile structure that prolapses in the LV outflow tract. This would be a highly atypical location for a vegetation. Suspect it is a segment of redundant mitral subvalvular apparatus. The mitral valve is degenerative in appearance. There is mild thickening of the mitral valve leaflet(s). Mild to moderate mitral annular calcification. No evidence of mitral valve regurgitation. Tricuspid Valve: The tricuspid valve is normal in structure. Tricuspid valve regurgitation is trivial. Aortic Valve: The aortic valve is tricuspid. Aortic valve regurgitation is not visualized. Mild to moderate aortic valve sclerosis/calcification is present, without any evidence of aortic stenosis. Aortic valve mean gradient measures 4.0 mmHg. Aortic valve peak gradient measures 8.6 mmHg. Aortic valve area, by VTI measures 1.89 cm. Pulmonic Valve: The pulmonic valve was not well visualized. Pulmonic valve regurgitation is not visualized. Aorta: The aortic root and ascending aorta are structurally normal, with no evidence of dilitation. Venous: The inferior vena cava is normal in size with greater than 50% respiratory variability, suggesting right atrial pressure of 3 mmHg. IAS/Shunts: No atrial level shunt detected by color flow Doppler. Additional Comments: There is pleural effusion in the left lateral region.  LEFT VENTRICLE PLAX 2D LVIDd:         3.60 cm LVIDs:         2.30 cm LV PW:         1.50 cm LV IVS:        1.50 cm LVOT diam:     2.00 cm LV SV:         54 LV SV Index:   29 LVOT Area:     3.14 cm  RIGHT VENTRICLE             IVC RV Basal diam:  2.20 cm     IVC diam: 1.20 cm RV S prime:     12.80 cm/s  TAPSE (M-mode): 1.8 cm LEFT ATRIUM             Index       RIGHT ATRIUM          Index LA diam:        3.20 cm 1.73 cm/m  RA Area:     8.70 cm LA Vol (A2C):   75.0 ml 40.51 ml/m RA Volume:   16.40 ml 8.86 ml/m LA Vol (A4C):   70.2 ml 37.91 ml/m LA Biplane Vol: 75.9 ml 40.99 ml/m  AORTIC VALVE AV Area (Vmax):    2.04 cm AV Area (Vmean):   2.05 cm AV Area (VTI):     1.89 cm AV Vmax:           147.00 cm/s AV Vmean:          91.700 cm/s AV VTI:            0.286 m AV Peak Grad:      8.6 mmHg AV Mean Grad:      4.0 mmHg LVOT Vmax:         95.30 cm/s LVOT Vmean:        59.800 cm/s LVOT VTI:          0.172 m LVOT/AV VTI ratio: 0.60  AORTA Ao Root diam: 3.10 cm Ao Asc diam:  2.40 cm  SHUNTS Systemic VTI:  0.17 m Systemic Diam: 2.00 cm Dani Gobble Croitoru MD Electronically signed by Sanda Klein MD Signature Date/Time: 05/05/2020/2:45:51 PM    Final  Medications: Infusions:  piperacillin-tazobactam (ZOSYN)  IV 3.375 g (05/07/20 0530)   vancomycin      Scheduled Medications:  (feeding supplement) PROSource Plus  30 mL Oral BID BM   amiodarone  200 mg Oral Daily   chlorhexidine  60 mL Topical Once   Chlorhexidine Gluconate Cloth  6 each Topical Daily   darbepoetin (ARANESP) injection - NON-DIALYSIS  150 mcg Subcutaneous Q Mon-1800   feeding supplement (ENSURE ENLIVE)  237 mL Oral TID BM   folic acid  1 mg Oral Daily   furosemide  40 mg Intravenous Q12H   insulin aspart  0-15 Units Subcutaneous TID WC   insulin aspart  0-5 Units Subcutaneous QHS   lidocaine  5 mL Intradermal Once   multivitamin with minerals  1 tablet Oral Daily   nortriptyline  25 mg Oral QHS   pantoprazole  40 mg Oral Daily   povidone-iodine  2 application Topical Once   predniSONE  5 mg Oral Q breakfast   sodium bicarbonate  1,300 mg Oral BID   tacrolimus  1.5 mg Oral Q12H   thiamine  100 mg Oral Daily    have reviewed scheduled and prn medications.  Physical Exam: General:  Flat, no c/o's  Heart:  RRR Lungs: mostly clear Abdomen: soft, non tender Extremities: pitting edema to dep areas -  bilat boots and bandages     05/07/2020,8:19 AM  LOS: 6 days

## 2020-05-07 NOTE — Anesthesia Postprocedure Evaluation (Signed)
Anesthesia Post Note  Patient: Wanda [name]  Procedure(s) Performed: AMPUTATION BELOW KNEE (Right Knee)     Patient location during evaluation: PACU Anesthesia Type: General Level of consciousness: sedated and patient cooperative Pain management: pain level controlled Vital Signs Assessment: post-procedure vital signs reviewed and stable Respiratory status: spontaneous breathing Cardiovascular status: stable Anesthetic complications: no Comments: After significant blood loss in OR, pt hypotensive in PACU. iState HGB 5.4. PRBCs x 2 units give. Immediate improvement in BP   No complications documented.  Last Vitals:  Vitals:   05/07/20 2045 05/07/20 2047  Pulse: (!) 101 (!) 102  Resp: 17 15  Temp:    SpO2: 100% 100%    Last Pain:  Vitals:   05/07/20 2047  TempSrc:   PainSc: [place]

## 2020-05-07 NOTE — Progress Notes (Signed)
Patient ID: Wanda [name], female   DOB: 1961-04-15, 59 y.o.   MRN: [id number]  PROGRESS NOTE    [name]  ZCH:[id number] DOB: 1961/03/01 DOA: 05/01/2020 PCP: Lorene Dy, MD   Brief Narrative:   59 y.o. female with medical history significant of diabetes type 2, hypertension, paroxysmal A. fib on Eliquis, hyperlipidemia, ESRD with failed kidney transplant in 2013 and repeat transplant in September 2020 at [facility] with current chronic renal disease stage IV, anemia of chronic disease, history of gastric sleeve surgery, hypoalbuminemia, right foot ulcer, chronic neuropathy presented with worsening right foot ulcer drainage getting worse over the last [redacted] weeks along with fever for 2 days.  On presentation, she was febrile with leukocytosis; x-ray of the right foot did not show any evidence of osteomyelitis.  She was started on IV fluids and antibiotics.  Assessment & Plan:   Sepsis: Present on admission Right foot ulcer infection with surrounding cellulitis,  -Patient presented with worsening right foot ulcer drainage over the last [redacted] weeks along with fever for the last couple of days.  Right foot x-ray does not show evidence of osteomyelitis. -Cultures negative so far. -MRI of the right foot without contrast did not show any evidence of osteomyelitis but showed superficial phlegmon overlying the posterior calcaneus. -Wound care evaluation appreciated.   -Recent lower extremity arterial duplex did not show any significant stenosis involving the visualized arteries of bilateral lower extremities Orthopedic evaluation/assistance appreciated.  They have had long discussion with the family and have mutually agreed upon to proceed with right BKA.  She is scheduled to have surgery today.  Hypotension: Per chart review, it sounds like patient's blood pressure was thought to be low based on the noninvasive measurement and for this reason, patient was transferred to ICU on 05/05/2020.  A line was  placed and blood pressure to A-line was showing hypertension.  This indicates pseudohypotension monitor by external devices.  PCCM saw patient and signed off.  Patient's blood pressure has remained stable.  She has been switched back to oral prednisone.  Chronic kidney disease stage IV Metabolic acidosis History of ESRD status post renal transplant on chronic immunosuppressive medications -Baseline creatinine around 2.90-3.  Creatinine on presentation was 3.09.  Creatinine 2.  8 8 this morning.  Bicarb is still at 15. - Monitor creatinine.  Strict input and output.  Avoid nephrotoxic medications.  Nephrology following.  Appreciate their help and defer management to them.  Paroxysmal A. Fib -Held Eliquis due to planned surgical intervention/BKA. -Continue amiodarone.  Currently rate controlled  Anemia of chronic disease -No signs of bleeding.    Hemoglobin dropped to 7.9.  Will defer to surgical services for presurgical blood transfusion.  Cachexia Hypoalbuminemia -Nutrition following.  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  CBGs with SSI.  Carb modified diet.   DVT prophylaxis: Heparin Code Status: Full Family Communication: None at bedside Disposition Plan: Status is: Inpatient  Remains inpatient appropriate because:Inpatient level of care appropriate due to severity of illness   Dispo: The patient is from: Home              Anticipated d/c is to: Home              Anticipated d/c date is: > 3 days              Patient currently is not medically stable to d/c.   Consultants: Nephrology/orthopedics  Procedures: None  Antimicrobials: Rocephin and vancomycin from 05/01/2020  onwards   Subjective: Seen and examined.  Feels better.  Pain controlled.  She is ready to have BKA today.  Objective: Vitals:   05/07/20 0700 05/07/20 0748 05/07/20 0800 05/07/20 0900  BP:      Pulse: 98  (!) 105 (!) 108  Resp: 16  15 14   Temp:  98.5 F  (36.9 C)    TempSrc:  Oral    SpO2: 100%  100% 100%  Weight:      Height:        Intake/Output Summary (Last 24 hours) at 05/07/2020 0945 Last data filed at 05/07/2020 0800 Gross per 24 hour  Intake 188.78 ml  Output 1400 ml  Net -1211.22 ml   Filed Weights   05/01/20 1444 05/01/20 2345  Weight: 78.5 kg 70.1 kg    Examination:  General exam: Appears calm and comfortable  Respiratory system: Clear to auscultation. Respiratory effort normal. Cardiovascular system: S1 & S2 heard, RRR. No JVD, murmurs, rubs, gallops or clicks. No pedal edema. Gastrointestinal system: Abdomen is nondistended, soft and nontender. No organomegaly or masses felt. Normal bowel sounds heard. Central nervous system: Alert and oriented. No focal neurological deficits. Extremities: Dressing in place in bilateral feet/heel.  Foul smell. Skin: No rashes, lesions or ulcers.  Psychiatry: Judgement and insight appear normal. Mood & affect appropriate.   Data Reviewed: I have personally reviewed following labs and imaging studies  CBC: Recent Labs  Lab 05/03/20 0131 05/04/20 0154 05/05/20 0208 05/06/20 0547 05/07/20 0559  WBC 20.0* 18.8* 19.9* 16.8* 15.9*  NEUTROABS 15.9* 15.3* 17.7* 13.7* 15.3*  HGB 8.6* 8.7* 8.5* 8.1* 7.9*  HCT 27.0* 27.8* 27.4* 25.5* 24.8*  MCV 101.5* 100.0 102.6* 99.2 99.6  PLT 304 261 257 264 517   Basic Metabolic Panel: Recent Labs  Lab 05/03/20 0131 05/04/20 0154 05/05/20 0208 05/06/20 0547 05/07/20 0559  NA 134* 135 136 136 138  K 5.3* 4.9 5.1 5.2* 5.0  CL 110 112* 110 114* 113*  CO2 15* 15* 14* 14* 15*  GLUCOSE 141* 133* 160* 181* 126*  BUN 47* 48* 45* 46* 52*  CREATININE 2.74* 2.93* 2.69* 2.79* 2.88*  CALCIUM 8.2* 8.5* 8.2* 8.5* 8.6*  MG 1.8 1.8 1.8 1.9 1.8   GFR: Estimated Creatinine Clearance: 22 mL/min (A) (by C-G formula based on SCr of 2.88 mg/dL (H)). Liver Function Tests: Recent Labs  Lab 05/01/20 1500 05/02/20 0106  AST 18 21  ALT 9 10  ALKPHOS  287* 301*  BILITOT 0.8 0.9  PROT 5.2* 5.3*  ALBUMIN 1.5* 1.5*   No results for input(s): LIPASE, AMYLASE in the last 168 hours. No results for input(s): AMMONIA in the last 168 hours. Coagulation Profile: Recent Labs  Lab 05/01/20 1500 05/02/20 0106  INR 2.7* 2.1*   Cardiac Enzymes: No results for input(s): CKTOTAL, CKMB, CKMBINDEX, TROPONINI in the last 168 hours. BNP (last 3 results) No results for input(s): PROBNP in the last 8760 hours. HbA1C: No results for input(s): HGBA1C in the last 72 hours. CBG: Recent Labs  Lab 05/06/20 1058 05/06/20 1538 05/06/20 1848 05/06/20 2125 05/07/20 0746  GLUCAP 167* 181* 185* 228* 122*   Lipid Profile: No results for input(s): CHOL, HDL, LDLCALC, TRIG, CHOLHDL, LDLDIRECT in the last 72 hours. Thyroid Function Tests: No results for input(s): TSH, T4TOTAL, FREET4, T3FREE, THYROIDAB in the last 72 hours. Anemia Panel: No results for input(s): VITAMINB12, FOLATE, FERRITIN, TIBC, IRON, RETICCTPCT in the last 72 hours. Sepsis Labs: Recent Labs  Lab 05/01/20 1500 05/02/20 0106  05/05/20 0840  LATICACIDVEN 1.0 1.3 0.9    Recent Results (from the past 240 hour(s))  Blood Culture (routine x 2)     Status: None   Collection Time: 05/01/20  3:03 PM   Specimen: BLOOD  Result Value Ref Range Status   Specimen Description BLOOD BLOOD RIGHT FOREARM  Final   Special Requests   Final    BOTTLES DRAWN AEROBIC AND ANAEROBIC Blood Culture results may not be optimal due to an inadequate volume of blood received in culture bottles   Culture   Final    NO GROWTH 5 DAYS Performed at [facility], [place] [address], [place], [place] [address]    Report Status 05/06/2020 FINAL  Final  Blood Culture (routine x 2)     Status: None   Collection Time: 05/01/20  3:15 PM   Specimen: BLOOD LEFT ARM  Result Value Ref Range Status   Specimen Description BLOOD LEFT ARM  Final   Special Requests   Final    BOTTLES DRAWN AEROBIC AND ANAEROBIC Blood  Culture results may not be optimal due to an inadequate volume of blood received in culture bottles   Culture   Final    NO GROWTH 5 DAYS Performed at [facility], [place] [address] [place], [place] [address]    Report Status 05/06/2020 FINAL  Final  SARS Coronavirus 2 by RT PCR (hospital order, performed in [facility] hospital lab) Nasopharyngeal Nasopharyngeal Swab     Status: None   Collection Time: 05/01/20  5:27 PM   Specimen: Nasopharyngeal Swab  Result Value Ref Range Status   SARS Coronavirus 2 NEGATIVE NEGATIVE Final    Comment: (NOTE) SARS-CoV-2 target nucleic acids are NOT DETECTED.  The SARS-CoV-2 RNA is generally detectable in upper and lower respiratory specimens during the acute phase of infection. The lowest concentration of SARS-CoV-2 viral copies this assay can detect is 250 copies / mL. A negative result does not preclude SARS-CoV-2 infection and should not be used as the sole basis for treatment or other patient management decisions.  A negative result may occur with improper specimen collection / handling, submission of specimen other than nasopharyngeal swab, presence of viral mutation(s) within the areas targeted by this assay, and inadequate number of viral copies (<250 copies / mL). A negative result must be combined with clinical observations, patient history, and epidemiological information.  Fact Sheet for Patients:   StrictlyIdeas.no  Fact Sheet for Healthcare Providers: BankingDealers.co.za  This test is not yet approved or  cleared by the [place] FDA and has been authorized for detection and/or diagnosis of SARS-CoV-2 by FDA under an Emergency Use Authorization (EUA).  This EUA will remain in effect (meaning this test can be used) for the duration of the COVID-19 declaration under Section 564(b)(1) of the Act, 21 U.S.C. section 360bbb-3(b)(1), unless the authorization is terminated  or revoked sooner.  Performed at [facility], [place] [address] [place], [place] [address]   Urine culture     Status: Abnormal   Collection Time: 05/01/20  8:11 PM   Specimen: In/Out Cath Urine  Result Value Ref Range Status   Specimen Description IN/OUT CATH URINE  Final   Special Requests   Final    NONE Performed at [facility], [place] [address], [place], [place] [address]    Culture MULTIPLE SPECIES PRESENT, SUGGEST RECOLLECTION (A)  Final   Report Status 05/03/2020 FINAL  Final  MRSA PCR Screening  Status: None   Collection Time: 05/01/20 11:38 PM   Specimen: Nasal Mucosa; Nasopharyngeal  Result Value Ref Range Status   MRSA by PCR NEGATIVE NEGATIVE Final    Comment:        The GeneXpert MRSA Assay (FDA approved for NASAL specimens only), is one component of a comprehensive MRSA colonization surveillance program. It is not intended to diagnose MRSA infection nor to guide or monitor treatment for MRSA infections. Performed at [facility], [place] [address] [place], [place] [address]   Culture, Urine     Status: Abnormal   Collection Time: 05/05/20  8:30 AM   Specimen: Urine, Catheterized  Result Value Ref Range Status   Specimen Description URINE, CATHETERIZED  Final   Special Requests   Final    Immunocompromised Performed at [facility], [place] [address] [place], [place] [address]    Culture MULTIPLE SPECIES PRESENT, SUGGEST RECOLLECTION (A)  Final   Report Status 05/06/2020 FINAL  Final         Radiology Studies: ECHOCARDIOGRAM COMPLETE  Result Date: 05/05/2020    ECHOCARDIOGRAM REPORT   Patient Name:   STEFANI BAIK Date of Exam: 05/05/2020 Medical Rec #:  [id number]         Height:       69.0 in Accession #:    [id number]        Weight:       154.5 lb Date of Birth:  [birth year]        BSA:          1.852 m Patient Age:    57 years          BP:           99/47 mmHg Patient Gender: F                 HR:           91 bpm. Exam  Location:  Inpatient Procedure: 2D Echo, Color Doppler and Cardiac Doppler Indications:    Abnormal ECG  History:        Patient has prior history of Echocardiogram examinations, most                 recent 02/08/2020. Arrythmias:Atrial Fibrillation; Risk                 Factors:Diabetes, Hypertension and Dyslipidemia. CKD.  Sonographer:    Clayton Lefort RDCS (AE) Referring Phys: [id number] [place]  1. Left ventricular ejection fraction, by estimation, is 70 to 75%. The left ventricle has hyperdynamic function. The left ventricle has no regional wall motion abnormalities. There is moderate concentric left ventricular hypertrophy. Indeterminate diastolic filling due to E-A fusion.  2. Right ventricular systolic function is normal. The right ventricular size is normal. Tricuspid regurgitation signal is inadequate for assessing PA pressure.  3. Left atrial size was moderately dilated.  4. Moderate pericardial effusion. The pericardial effusion is circumferential. There is no evidence of cardiac tamponade.  5. There is a small (8 x 2 mm) hyperechogenic mobile structure that prolapses in the LV outflow tract. This would be a highly atypical location for a vegetation. Suspect it is a segment of redundant mitral subvalvular apparatus. The mitral valve is degenerative. No evidence of mitral valve regurgitation.  6. The aortic valve is tricuspid. Aortic valve regurgitation is not visualized. Mild to moderate aortic valve sclerosis/calcification is present, without any evidence of aortic stenosis.  7. The inferior vena  cava is normal in size with greater than 50% respiratory variability, suggesting right atrial pressure of 3 mmHg. Comparison(s): No significant change from prior study. Prior images reviewed side by side. The pericardial effusion is msaller. FINDINGS  Left Ventricle: Left ventricular ejection fraction, by estimation, is 70 to 75%. The left ventricle has hyperdynamic function. The left ventricle has  no regional wall motion abnormalities. The left ventricular internal cavity size was normal in size. There is moderate concentric left ventricular hypertrophy. Indeterminate diastolic filling due to E-A fusion. Indeterminate filling pressures. Right Ventricle: The right ventricular size is normal. No increase in right ventricular wall thickness. Right ventricular systolic function is normal. Tricuspid regurgitation signal is inadequate for assessing PA pressure. Left Atrium: Left atrial size was moderately dilated. Right Atrium: Right atrial size was normal in size. Pericardium: A moderately sized pericardial effusion is present. The pericardial effusion is circumferential. There is no evidence of cardiac tamponade. Mitral Valve: There is a small (8 x 2 mm) hyperechogenic mobile structure that prolapses in the LV outflow tract. This would be a highly atypical location for a vegetation. Suspect it is a segment of redundant mitral subvalvular apparatus. The mitral valve is degenerative in appearance. There is mild thickening of the mitral valve leaflet(s). Mild to moderate mitral annular calcification. No evidence of mitral valve regurgitation. Tricuspid Valve: The tricuspid valve is normal in structure. Tricuspid valve regurgitation is trivial. Aortic Valve: The aortic valve is tricuspid. Aortic valve regurgitation is not visualized. Mild to moderate aortic valve sclerosis/calcification is present, without any evidence of aortic stenosis. Aortic valve mean gradient measures 4.0 mmHg. Aortic valve peak gradient measures 8.6 mmHg. Aortic valve area, by VTI measures 1.89 cm. Pulmonic Valve: The pulmonic valve was not well visualized. Pulmonic valve regurgitation is not visualized. Aorta: The aortic root and ascending aorta are structurally normal, with no evidence of dilitation. Venous: The inferior vena cava is normal in size with greater than 50% respiratory variability, suggesting right atrial pressure of 3 mmHg.  IAS/Shunts: No atrial level shunt detected by color flow Doppler. Additional Comments: There is pleural effusion in the left lateral region.  LEFT VENTRICLE PLAX 2D LVIDd:         3.60 cm LVIDs:         2.30 cm LV PW:         1.50 cm LV IVS:        1.50 cm LVOT diam:     2.00 cm LV SV:         54 LV SV Index:   29 LVOT Area:     3.14 cm  RIGHT VENTRICLE             IVC RV Basal diam:  2.20 cm     IVC diam: 1.20 cm RV S prime:     12.80 cm/s TAPSE (M-mode): 1.8 cm LEFT ATRIUM             Index       RIGHT ATRIUM          Index LA diam:        3.20 cm 1.73 cm/m  RA Area:     8.70 cm LA Vol (A2C):   75.0 ml 40.51 ml/m RA Volume:   16.40 ml 8.86 ml/m LA Vol (A4C):   70.2 ml 37.91 ml/m LA Biplane Vol: 75.9 ml 40.99 ml/m  AORTIC VALVE AV Area (Vmax):    2.04 cm AV Area (Vmean):   2.05 cm AV Area (VTI):  1.89 cm AV Vmax:           147.00 cm/s AV Vmean:          91.700 cm/s AV VTI:            0.286 m AV Peak Grad:      8.6 mmHg AV Mean Grad:      4.0 mmHg LVOT Vmax:         95.30 cm/s LVOT Vmean:        59.800 cm/s LVOT VTI:          0.172 m LVOT/AV VTI ratio: 0.60  AORTA Ao Root diam: 3.10 cm Ao Asc diam:  2.40 cm  SHUNTS Systemic VTI:  0.17 m Systemic Diam: 2.00 cm Dani Gobble Croitoru MD Electronically signed by Sanda Klein MD Signature Date/Time: 05/05/2020/2:45:51 PM    Final         Scheduled Meds: . (feeding supplement) PROSource Plus  30 mL Oral BID BM  . amiodarone  200 mg Oral Daily  . chlorhexidine  60 mL Topical Once  . Chlorhexidine Gluconate Cloth  6 each Topical Daily  . darbepoetin (ARANESP) injection - NON-DIALYSIS  150 mcg Subcutaneous Q Mon-1800  . feeding supplement (ENSURE ENLIVE)  237 mL Oral TID BM  . folic acid  1 mg Oral Daily  . furosemide  40 mg Intravenous Q12H  . insulin aspart  0-15 Units Subcutaneous TID WC  . insulin aspart  0-5 Units Subcutaneous QHS  . lidocaine  5 mL Intradermal Once  . multivitamin with minerals  1 tablet Oral Daily  . nortriptyline  25 mg Oral  QHS  . pantoprazole  40 mg Oral Daily  . povidone-iodine  2 application Topical Once  . predniSONE  5 mg Oral Q breakfast  . sodium bicarbonate  1,300 mg Oral BID  . tacrolimus  1.5 mg Oral Q12H  . thiamine  100 mg Oral Daily   Continuous Infusions: . piperacillin-tazobactam (ZOSYN)  IV 3.375 g (05/07/20 0530)  . vancomycin     Total time spent 30 minutes  Darliss Cheney, MD [facility] 05/07/2020, 9:45 AM

## 2020-05-07 NOTE — Interval H&P Note (Signed)
History and Physical Interval Note:  05/07/2020 2:15 PM  Wanda [name]  has presented today for surgery, with the diagnosis of Gangrene Right lower leg.  The various methods of treatment have been discussed with the patient and family. After consideration of risks, benefits and other options for treatment, the patient has consented to  Procedure(s): AMPUTATION BELOW KNEE (Right) as a surgical intervention.  The patient's history has been reviewed, patient examined, no change in status, stable for surgery.  I have reviewed the patient's chart and labs.  Questions were answered to the patient's satisfaction.    The risks and benefits of the alternative treatment options have been discussed in detail.  The patient wishes to proceed with surgery and specifically understands risks of bleeding, infection, nerve damage, blood clots, need for additional surgery, amputation and death.   [name]

## 2020-05-07 NOTE — Progress Notes (Signed)
Orthopedic Tech Progress Note Patient Details:  Wanda [name] [birth year] [id number] Called order into [facility]. Patient ID: Wanda [name], female   DOB: [birth year], 59 y.o.   MRN: [id number]   Chip Boer 05/07/2020, 6:38 PM

## 2020-05-08 ENCOUNTER — Encounter (HOSPITAL_COMMUNITY): Payer: Self-pay | Admitting: Orthopedic Surgery

## 2020-05-08 LAB — RENAL FUNCTION PANEL
Albumin: 1.5 g/dL — ABNORMAL LOW (ref 3.5–5.0)
Anion gap: 12 (ref 5–15)
BUN: 51 mg/dL — ABNORMAL HIGH (ref 6–20)
CO2: 15 mmol/L — ABNORMAL LOW (ref 22–32)
Calcium: 8.4 mg/dL — ABNORMAL LOW (ref 8.9–10.3)
Chloride: 112 mmol/L — ABNORMAL HIGH (ref 98–111)
Creatinine, Ser: 2.79 mg/dL — ABNORMAL HIGH (ref 0.44–1.00)
GFR calc Af Amer: 21 mL/min — ABNORMAL LOW (ref >60)
GFR calc non Af Amer: 18 mL/min — ABNORMAL LOW (ref >60)
Glucose, Bld: 134 mg/dL — ABNORMAL HIGH (ref 70–99)
Phosphorus: 3 mg/dL (ref 2.5–4.6)
Potassium: 4.9 mmol/L (ref 3.5–5.1)
Sodium: 139 mmol/L (ref 135–145)

## 2020-05-08 LAB — GLUCOSE, CAPILLARY
Glucose-Capillary: 110 mg/dL — ABNORMAL HIGH (ref 70–99)
Glucose-Capillary: 122 mg/dL — ABNORMAL HIGH (ref 70–99)
Glucose-Capillary: 130 mg/dL — ABNORMAL HIGH (ref 70–99)
Glucose-Capillary: 146 mg/dL — ABNORMAL HIGH (ref 70–99)
Glucose-Capillary: 150 mg/dL — ABNORMAL HIGH (ref 70–99)
Glucose-Capillary: 164 mg/dL — ABNORMAL HIGH (ref 70–99)

## 2020-05-08 LAB — CBC WITH DIFFERENTIAL/PLATELET
Abs Immature Granulocytes: 0.16 10*3/uL — ABNORMAL HIGH (ref 0.00–0.07)
Basophils Absolute: 0 10*3/uL (ref 0.0–0.1)
Basophils Relative: 0 %
Eosinophils Absolute: 0 10*3/uL (ref 0.0–0.5)
Eosinophils Relative: 0 %
HCT: 26.7 % — ABNORMAL LOW (ref 36.0–46.0)
Hemoglobin: 8.7 g/dL — ABNORMAL LOW (ref 12.0–15.0)
Immature Granulocytes: 1 %
Lymphocytes Relative: 14 %
Lymphs Abs: 1.7 10*3/uL (ref 0.7–4.0)
MCH: 30.2 pg (ref 26.0–34.0)
MCHC: 32.6 g/dL (ref 30.0–36.0)
MCV: 92.7 fL (ref 80.0–100.0)
Monocytes Absolute: 0.5 10*3/uL (ref 0.1–1.0)
Monocytes Relative: 4 %
Neutro Abs: 9.5 10*3/uL — ABNORMAL HIGH (ref 1.7–7.7)
Neutrophils Relative %: 81 %
Platelets: 170 10*3/uL (ref 150–400)
RBC: 2.88 MIL/uL — ABNORMAL LOW (ref 3.87–5.11)
RDW: 17.6 % — ABNORMAL HIGH (ref 11.5–15.5)
WBC: 12.3 10*3/uL — ABNORMAL HIGH (ref 4.0–10.5)
nRBC: 0 % (ref 0.0–0.2)

## 2020-05-08 LAB — MAGNESIUM
Magnesium: 1.8 mg/dL (ref 1.7–2.4)
Magnesium: 2.2 mg/dL (ref 1.7–2.4)

## 2020-05-08 LAB — PHOSPHORUS: Phosphorus: 2.9 mg/dL (ref 2.5–4.6)

## 2020-05-08 LAB — C-REACTIVE PROTEIN: CRP: 9.6 mg/dL — ABNORMAL HIGH (ref <1.0)

## 2020-05-08 MED ORDER — VITAL 1.5 CAL PO LIQD
1000.0000 mL | ORAL | Status: DC
  Administered 2020-05-08 – 2020-05-14 (×7): 1000 mL
  Filled 2020-05-08 (×12): qty 1000

## 2020-05-08 MED ORDER — MAGNESIUM SULFATE IN D5W 1-5 GM/100ML-% IV SOLN
1.0000 g | Freq: Once | INTRAVENOUS | Status: AC
  Administered 2020-05-08: 1 g via INTRAVENOUS
  Filled 2020-05-08: qty 100

## 2020-05-08 MED ORDER — FUROSEMIDE 10 MG/ML IJ SOLN
80.0000 mg | Freq: Two times a day (BID) | INTRAMUSCULAR | Status: DC
  Administered 2020-05-08 – 2020-05-16 (×16): 80 mg via INTRAVENOUS
  Filled 2020-05-08 (×16): qty 8

## 2020-05-08 MED ORDER — PROSOURCE TF PO LIQD
45.0000 mL | Freq: Two times a day (BID) | ORAL | Status: DC
  Administered 2020-05-08 – 2020-05-14 (×13): 45 mL
  Filled 2020-05-08 (×14): qty 45

## 2020-05-08 MED ORDER — APIXABAN 5 MG PO TABS
5.0000 mg | ORAL_TABLET | Freq: Two times a day (BID) | ORAL | Status: DC
  Administered 2020-05-08 – 2020-05-18 (×20): 5 mg via ORAL
  Filled 2020-05-08 (×20): qty 1

## 2020-05-08 NOTE — Progress Notes (Signed)
Occupational Therapy Treatment Patient Details Name: Wanda [name] MRN: [id number] DOB: 11/08/1960 Today's Date: 05/08/2020    History of present illness Wanda [name] is a 59 y.o. female with medical history significant of diabetes type 2, hypertension, paroxysmal A. fib, hyperlipidemia, ESRD with failed kidney transplant in 2013 and repeat transplant in September 2020 at [facility] with current chronic renal disease stage IV, anemia of chronic disease, history of gastric sleeve surgery, hypoalbuminemia, right foot ulcer, chronic neuropathy presented with worsening right foot ulcer drainage getting worse over the last 2 weeks.  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   OT comments  Pt assisted from bed to chair using A-P transfer with +2 total assist. Nursing staff to assist her back to bed with maxisky. Pt demonstrating poor sitting balance and significant weakness. Performed B AAROM, squeeze ball exercises.   Follow Up Recommendations  No OT follow up;Supervision/Assistance - 24 hour    Equipment Recommendations  None recommended by OT    Recommendations for Other Services      Precautions / Restrictions Precautions Precautions: Fall Precaution Comments: new R BKA Required Braces or Orthoses: Other Brace Other Brace: limb protector Restrictions RLE Weight Bearing: Non weight bearing       Mobility Bed Mobility Overal bed mobility: Needs Assistance Bed Mobility: Supine to Sit     Supine to sit: +2 for physical assistance;Total assist     General bed mobility comments: HOB up, total assist for trunk and LEs to pivot in preparation for A-P transfer  Transfers Overall transfer level: Needs assistance   Transfers: Anterior-Posterior Transfer       Anterior-Posterior transfers: +2 physical assistance;Total assist   General transfer comment: A-P bed to chair with LEs supported by bed    Balance Overall balance assessment: Needs assistance   Sitting balance-Leahy Scale: Poor                                      ADL either performed or assessed with clinical judgement   ADL   Eating/Feeding: Set up;Sitting Eating/Feeding Details (indicate cue type and reason): drink with straw                 Lower Body Dressing: Total assistance;Bed level Lower Body Dressing Details (indicate cue type and reason): redressed R LE in ace wrap, stocking and limb protector                     Vision       Perception     Praxis      Cognition Arousal/Alertness: Awake/alert Behavior During Therapy: Flat affect Overall Cognitive Status: Impaired/Different from baseline Area of Impairment: Following commands;Problem solving                       Following Commands: Follows one step commands with increased time;Follows one step commands inconsistently     Problem Solving: Slow processing;Decreased initiation;Difficulty sequencing;Requires verbal cues;Requires tactile cues General Comments: pt alert, smiling intermittently, low motivation for therapy, minimally conversant        Exercises General Exercises - Upper Extremity Shoulder Extension: AAROM;Both;10 reps;Supine Other Exercises Other Exercises: squeeze ball   Shoulder Instructions       General Comments      Pertinent Vitals/ Pain       Pain Assessment: Faces Faces Pain Scale: Hurts little more Pain Location: buttocks Pain Descriptors / Indicators: Cramping  Pain Intervention(s): Monitored during session;Repositioned  Home Living                                          Prior Functioning/Environment              Frequency  Min 2X/week        Progress Toward Goals  OT Goals(current goals can now be found in the care plan section)  Progress towards OT goals: Progressing toward goals  Acute Rehab OT Goals Patient Stated Goal: pt did not state OT Goal Formulation: With patient Time For Goal Achievement: 05/17/20 Potential to Achieve Goals:  [place] Discharge plan remains appropriate    Co-evaluation    PT/OT/SLP Co-Evaluation/Treatment: Yes Reason for Co-Treatment: Complexity of the patient's impairments (multi-system involvement);For patient/therapist safety          AM-PAC OT "6 Clicks" Daily Activity     Outcome Measure   Help from another person eating meals?: A Little Help from another person taking care of personal grooming?: A Lot Help from another person toileting, which includes using toliet, bedpan, or urinal?: Total Help from another person bathing (including washing, rinsing, drying)?: A Lot Help from another person to put on and taking off regular upper body clothing?: Total Help from another person to put on and taking off regular lower body clothing?: Total 6 Click Score: 10    End of Session    OT Visit Diagnosis: Other abnormalities of gait and mobility (R26.89);Muscle weakness (generalized) (M62.81)   Activity Tolerance Patient tolerated treatment well   Patient Left in chair;with call bell/phone within reach;with chair alarm set   Nurse Communication Need for lift equipment;Mobility status        Time: 1236-1315 OT Time Calculation (min): 39 min  Charges: OT General Charges $OT Visit: 1 Visit OT Treatments $Therapeutic Activity: 8-22 mins  Nestor Lewandowsky, OTR/L Acute Rehabilitation Services Pager: [phone] Office: [phone]   Wanda [name] 05/08/2020, 1:34 PM

## 2020-05-08 NOTE — Progress Notes (Signed)
Patient ID: Wanda [name], female   DOB: 11-30-1960, 59 y.o.   MRN: [id number]  PROGRESS NOTE    MACK THURMON  JAS:[id number] DOB: 1961-03-26 DOA: 05/01/2020 PCP: Lorene Dy, MD   Brief Narrative:   59 y.o. female with medical history significant of diabetes type 2, hypertension, paroxysmal A. fib on Eliquis, hyperlipidemia, ESRD with failed kidney transplant in 2013 and repeat transplant in September 2020 at [facility] with current chronic renal disease stage IV, anemia of chronic disease, history of gastric sleeve surgery, hypoalbuminemia, right foot ulcer, chronic neuropathy presented with worsening right foot ulcer drainage getting worse over the last [redacted] weeks along with fever for 2 days.  On presentation, she was febrile with leukocytosis; x-ray of the right foot did not show any evidence of osteomyelitis.  She was started on IV fluids and antibiotics.  Assessment & Plan:   Sepsis: Present on admission Right foot ulcer infection with surrounding cellulitis,  -Patient presented with worsening right foot ulcer drainage over the last [redacted] weeks along with fever for the last couple of days.  Right foot x-ray does not show evidence of osteomyelitis. -Cultures negative so far. -MRI of the right foot without contrast did not show any evidence of osteomyelitis but showed superficial phlegmon overlying the posterior calcaneus. -Wound care evaluation appreciated.   -Recent lower extremity arterial duplex did not show any significant stenosis involving the visualized arteries of bilateral lower extremities Orthopedic evaluation/assistance appreciated.  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  Management per orthopedic.  Hypotension: Per chart review, it sounds like patient's blood pressure was thought to be low based on the noninvasive measurement and for this reason, patient was transferred to ICU on 05/05/2020.  A line was placed and blood pressure to A-line was showing hypertension.  This indicates pseudohypotension  monitor by external devices.  PCCM saw patient and signed off.  Patient's blood pressure has remained stable.  She has been switched back to oral prednisone.  Chronic kidney disease stage IV Metabolic acidosis History of ESRD status post renal transplant on chronic immunosuppressive medications -Baseline creatinine around 2.90-3.  Creatinine on presentation was 3.09.  Creatinine and bicarb slightly improved. - Monitor creatinine.  Strict input and output.  Avoid nephrotoxic medications.  Nephrology following.  Appreciate their help and defer management to them.  Currently patient is on high-dose of IV Lasix with good urine output.  Paroxysmal A. Fib -Held Eliquis due to planned surgical intervention/BKA.  Now s/p BKA.  Will defer to surgery about clearance.  I have sent a secure chat. -Continue amiodarone.  Currently rate controlled  Anemia of chronic disease -No signs of bleeding.    Hemoglobin 8.7 today.  Cachexia Hypoalbuminemia -Nutrition following.  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  CBGs with SSI.  Carb modified diet.   DVT prophylaxis: Heparin Code Status: Full Family Communication: None at bedside Disposition Plan: Status is: Inpatient  Remains inpatient appropriate because:Inpatient level of care appropriate due to severity of illness   Dispo: The patient is from: Home              Anticipated d/c is to: Home              Anticipated d/c date is: > 3 days              Patient currently is not medically stable to d/c.   Consultants: Nephrology/orthopedics  Procedures: None  Antimicrobials: Rocephin and vancomycin from 05/01/2020 onwards   Subjective: Seen and examined.  She has no complaints.  Pain controlled.  Objective: Vitals:   05/08/20 1000 05/08/20 1100 05/08/20 1158 05/08/20 1200  BP:      Pulse: (!) 107 (!) 105  (!) 106  Resp: 14 13  13   Temp:   97.7 F (36.5 C)   TempSrc:   Oral   SpO2: 100% 100%   100%  Weight:      Height:        Intake/Output Summary (Last 24 hours) at 05/08/2020 1312 Last data filed at 05/08/2020 1200 Gross per 24 hour  Intake 1607.57 ml  Output 1220 ml  Net 387.57 ml   Filed Weights   05/01/20 1444 05/01/20 2345  Weight: 78.5 kg 70.1 kg    Examination:  General exam: Appears calm and comfortable  Respiratory system: Clear to auscultation. Respiratory effort normal. Cardiovascular system: S1 & S2 heard, RRR. No JVD, murmurs, rubs, gallops or clicks. No pedal edema. Gastrointestinal system: Abdomen is nondistended, soft and nontender. No organomegaly or masses felt. Normal bowel sounds heard. Central nervous system: Alert and oriented. No focal neurological deficits. Extremities: Right BKA Skin: No rashes, lesions or ulcers.  Psychiatry: Judgement and insight appear normal. Mood & affect appropriate.   Data Reviewed: I have personally reviewed following labs and imaging studies  CBC: Recent Labs  Lab 05/04/20 0154 05/04/20 0154 05/05/20 0208 05/06/20 0547 05/07/20 0559 05/07/20 2018 05/08/20 0434  WBC 18.8*  --  19.9* 16.8* 15.9*  --  12.3*  NEUTROABS 15.3*  --  17.7* 13.7* 15.3*  --  9.5*  HGB 8.7*   < > 8.5* 8.1* 7.9* 9.2* 8.7*  HCT 27.8*   < > 27.4* 25.5* 24.8* 28.2* 26.7*  MCV 100.0  --  102.6* 99.2 99.6  --  92.7  PLT 261  --  257 264 245  --  170   < > = values in this interval not displayed.   Basic Metabolic Panel: Recent Labs  Lab 05/04/20 0154 05/05/20 0208 05/06/20 0547 05/07/20 0559 05/08/20 0434  NA 135 136 136 138 139  K 4.9 5.1 5.2* 5.0 4.9  CL 112* 110 114* 113* 112*  CO2 15* 14* 14* 15* 15*  GLUCOSE 133* 160* 181* 126* 134*  BUN 48* 45* 46* 52* 51*  CREATININE 2.93* 2.69* 2.79* 2.88* 2.79*  CALCIUM 8.5* 8.2* 8.5* 8.6* 8.4*  MG 1.8 1.8 1.9 1.8 1.8  PHOS  --   --   --   --  3.0   GFR: Estimated Creatinine Clearance: 22.7 mL/min (A) (by C-G formula based on SCr of 2.79 mg/dL (H)). Liver Function Tests: Recent  Labs  Lab 05/01/20 1500 05/02/20 0106 05/08/20 0434  AST 18 21  --   ALT 9 10  --   ALKPHOS 287* 301*  --   BILITOT 0.8 0.9  --   PROT 5.2* 5.3*  --   ALBUMIN 1.5* 1.5* 1.5*   No results for input(s): LIPASE, AMYLASE in the last 168 hours. No results for input(s): AMMONIA in the last 168 hours. Coagulation Profile: Recent Labs  Lab 05/01/20 1500 05/02/20 0106  INR 2.7* 2.1*   Cardiac Enzymes: No results for input(s): CKTOTAL, CKMB, CKMBINDEX, TROPONINI in the last 168 hours. BNP (last 3 results) No results for input(s): PROBNP in the last 8760 hours. HbA1C: No results for input(s): HGBA1C in the last 72 hours. CBG: Recent Labs  Lab 05/07/20 1146 05/07/20 1331 05/07/20 2131 05/08/20 0834 05/08/20 1126  GLUCAP 118* 91 138* 110* 164*  Lipid Profile: No results for input(s): CHOL, HDL, LDLCALC, TRIG, CHOLHDL, LDLDIRECT in the last 72 hours. Thyroid Function Tests: No results for input(s): TSH, T4TOTAL, FREET4, T3FREE, THYROIDAB in the last 72 hours. Anemia Panel: No results for input(s): VITAMINB12, FOLATE, FERRITIN, TIBC, IRON, RETICCTPCT in the last 72 hours. Sepsis Labs: Recent Labs  Lab 05/01/20 1500 05/02/20 0106 05/05/20 0840  LATICACIDVEN 1.0 1.3 0.9    Recent Results (from the past 240 hour(s))  Blood Culture (routine x 2)     Status: None   Collection Time: 05/01/20  3:03 PM   Specimen: BLOOD  Result Value Ref Range Status   Specimen Description BLOOD BLOOD RIGHT FOREARM  Final   Special Requests   Final    BOTTLES DRAWN AEROBIC AND ANAEROBIC Blood Culture results may not be optimal due to an inadequate volume of blood received in culture bottles   Culture   Final    NO GROWTH 5 DAYS Performed at [facility], [place] [address], [place], [place] [address]    Report Status 05/06/2020 FINAL  Final  Blood Culture (routine x 2)     Status: None   Collection Time: 05/01/20  3:15 PM   Specimen: BLOOD LEFT ARM  Result Value Ref Range Status    Specimen Description BLOOD LEFT ARM  Final   Special Requests   Final    BOTTLES DRAWN AEROBIC AND ANAEROBIC Blood Culture results may not be optimal due to an inadequate volume of blood received in culture bottles   Culture   Final    NO GROWTH 5 DAYS Performed at [facility], [place] [address] [place], [place] [address]    Report Status 05/06/2020 FINAL  Final  SARS Coronavirus 2 by RT PCR (hospital order, performed in [facility] hospital lab) Nasopharyngeal Nasopharyngeal Swab     Status: None   Collection Time: 05/01/20  5:27 PM   Specimen: Nasopharyngeal Swab  Result Value Ref Range Status   SARS Coronavirus 2 NEGATIVE NEGATIVE Final    Comment: (NOTE) SARS-CoV-2 target nucleic acids are NOT DETECTED.  The SARS-CoV-2 RNA is generally detectable in upper and lower respiratory specimens during the acute phase of infection. The lowest concentration of SARS-CoV-2 viral copies this assay can detect is 250 copies / mL. A negative result does not preclude SARS-CoV-2 infection and should not be used as the sole basis for treatment or other patient management decisions.  A negative result may occur with improper specimen collection / handling, submission of specimen other than nasopharyngeal swab, presence of viral mutation(s) within the areas targeted by this assay, and inadequate number of viral copies (<250 copies / mL). A negative result must be combined with clinical observations, patient history, and epidemiological information.  Fact Sheet for Patients:   StrictlyIdeas.no  Fact Sheet for Healthcare Providers: BankingDealers.co.za  This test is not yet approved or  cleared by the [place] FDA and has been authorized for detection and/or diagnosis of SARS-CoV-2 by FDA under an Emergency Use Authorization (EUA).  This EUA will remain in effect (meaning this test can be used) for the duration of the COVID-19 declaration  under Section 564(b)(1) of the Act, 21 U.S.C. section 360bbb-3(b)(1), unless the authorization is terminated or revoked sooner.  Performed at [facility], [place] [address] [place], [place] [address]   Urine culture     Status: Abnormal   Collection Time: 05/01/20  8:11 PM   Specimen: In/Out Cath Urine  Result Value  Ref Range Status   Specimen Description IN/OUT CATH URINE  Final   Special Requests   Final    NONE Performed at [facility], [place] [address], [place], [place] [address]    Culture MULTIPLE SPECIES PRESENT, SUGGEST RECOLLECTION (A)  Final   Report Status 05/03/2020 FINAL  Final  MRSA PCR Screening     Status: None   Collection Time: 05/01/20 11:38 PM   Specimen: Nasal Mucosa; Nasopharyngeal  Result Value Ref Range Status   MRSA by PCR NEGATIVE NEGATIVE Final    Comment:        The GeneXpert MRSA Assay (FDA approved for NASAL specimens only), is one component of a comprehensive MRSA colonization surveillance program. It is not intended to diagnose MRSA infection nor to guide or monitor treatment for MRSA infections. Performed at [facility], [place] [address], [place], [place] [address]   Culture, Urine     Status: Abnormal   Collection Time: 05/05/20  8:30 AM   Specimen: Urine, Catheterized  Result Value Ref Range Status   Specimen Description URINE, CATHETERIZED  Final   Special Requests   Final    Immunocompromised Performed at [facility], [place] [address], [place],  [address]    Culture MULTIPLE SPECIES PRESENT, SUGGEST RECOLLECTION (A)  Final   Report Status 05/06/2020 FINAL  Final         Radiology Studies: No results found.      Scheduled Meds: . (feeding supplement) PROSource Plus  30 mL Oral BID BM  . sodium chloride   Intravenous Once  . amiodarone  200 mg Oral Daily  . Chlorhexidine Gluconate Cloth  6 each Topical Daily  . darbepoetin (ARANESP) injection - NON-DIALYSIS  150 mcg Subcutaneous Q Mon-1800    . feeding supplement (ENSURE ENLIVE)  237 mL Oral TID BM  . folic acid  1 mg Oral Daily  . furosemide  80 mg Intravenous Q12H  . insulin aspart  0-15 Units Subcutaneous TID WC  . insulin aspart  0-5 Units Subcutaneous QHS  . lidocaine  5 mL Intradermal Once  . multivitamin with minerals  1 tablet Oral Daily  . nortriptyline  25 mg Oral QHS  . pantoprazole  40 mg Oral Daily  . predniSONE  5 mg Oral Q breakfast  . senna-docusate  1 tablet Oral Daily  . sodium bicarbonate  1,300 mg Oral BID  . tacrolimus  1 mg Oral BID  . thiamine  100 mg Oral Daily  . vitamin C  250 mg Oral BID  . zinc sulfate  220 mg Oral Daily   Continuous Infusions: . piperacillin-tazobactam (ZOSYN)  IV Stopped (05/08/20 0920)   Total time spent 29 minutes  Darliss Cheney, MD [facility] 05/08/2020, 1:12 PM

## 2020-05-08 NOTE — Procedures (Signed)
Cortrak  Person Inserting Tube:  Thales Knipple, RD Tube Type:  Cortrak - 43 inches Tube Location:  Left nare Initial Placement:  Stomach Secured by: Bridle Technique Used to Measure Tube Placement:  Documented cm marking at nare/ corner of mouth Cortrak Secured At:  70 cm    No x-ray is required. RN may begin using tube.   If the tube becomes dislodged please keep the tube and contact the Cortrak team at [email or web address] (password [email or web address]) for replacement.  If after hours and replacement cannot be delayed, place a NG tube and confirm placement with an abdominal x-ray.    Mariana Single RD, LDN Clinical Nutrition Pager listed in [place]

## 2020-05-08 NOTE — Progress Notes (Signed)
Physical Therapy Treatment Patient Details Name: TIMBERLEE ROBLERO MRN: [id number] DOB: January 14, 1961 Today's Date: 05/08/2020    History of Present Illness GEORGIANNE GRITZ is a 59 y.o. female with medical history significant of diabetes type 2, hypertension, paroxysmal A. fib, hyperlipidemia, ESRD with failed kidney transplant in 2013 and repeat transplant in September 2020 at [facility] with current chronic renal disease stage IV, anemia of chronic disease, history of gastric sleeve surgery, hypoalbuminemia, right foot ulcer, chronic neuropathy presented with worsening right foot ulcer drainage getting worse over the last 2 weeks.  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    PT Comments    Pt generally flat and energy-less.  She tries to follow commands and participate, but is able to give little effort.  Emphasis on  Upper and LE exercise to strengthen and warm up then completed sitting balance/tolerance in long sitting before completing and a/p transfer to the recliner.  Pt mildly symptomatic in sitting in that she became less vocal and responsive.     Follow Up Recommendations  Supervision/Assistance - 24 hour;Home health PT;Other (comment);No PT follow up     Equipment Recommendations  None recommended by PT    Recommendations for Other Services       Precautions / Restrictions Precautions Precautions: Fall Precaution Comments: new R BKA Required Braces or Orthoses: Other Brace Other Brace: limb protector Restrictions RLE Weight Bearing: Non weight bearing    Mobility  Bed Mobility Overal bed mobility: Needs Assistance Bed Mobility: Supine to Sit     Supine to sit: +2 for physical assistance;Total assist     General bed mobility comments: HOB up, total assist for trunk and LEs to pivot in preparation for A-P transfer  Transfers Overall transfer level: Needs assistance   Transfers: Anterior-Posterior Transfer       Anterior-Posterior transfers: +2 physical assistance;Total assist   General  transfer comment: A-P bed to chair with LEs supported by bed  Ambulation/Gait                 Stairs             Wheelchair Mobility    Modified Rankin (Stroke Patients Only)       Balance Overall balance assessment: Needs assistance   Sitting balance-Leahy Scale: Poor                                      Cognition Arousal/Alertness: Awake/alert Behavior During Therapy: Flat affect Overall Cognitive Status: Impaired/Different from baseline Area of Impairment: Following commands;Problem solving                       Following Commands: Follows one step commands with increased time;Follows one step commands inconsistently     Problem Solving: Slow processing;Decreased initiation;Difficulty sequencing;Requires verbal cues;Requires tactile cues General Comments: pt alert, smiling intermittently, low motivation for therapy, minimally conversant      Exercises General Exercises - Upper Extremity Shoulder Extension: AAROM;Both;10 reps;Supine Other Exercises Other Exercises: hip/knee flexion/ext bil LE's with graded resistance given through thigh rather than the foot. Other Exercises: squeeze ball    General Comments        Pertinent Vitals/Pain Pain Assessment: Faces Faces Pain Scale: Hurts little more Pain Location: buttocks Pain Descriptors / Indicators: Cramping Pain Intervention(s): Monitored during session    Home Living  Prior Function            PT Goals (current goals can now be found in the care plan section) Acute Rehab PT Goals Patient Stated Goal: pt did not state PT Goal Formulation: With patient Time For Goal Achievement: 05/20/20 Potential to Achieve Goals: Fair Progress towards PT goals: Not progressing toward goals - comment (just not able to participate much.)    Frequency    Min 2X/week      PT Plan Current plan remains appropriate    Co-evaluation PT/OT/SLP  Co-Evaluation/Treatment: Yes Reason for Co-Treatment: Complexity of the patient's impairments (multi-system involvement)          AM-PAC PT "6 Clicks" Mobility   Outcome Measure  Help needed turning from your back to your side while in a flat bed without using bedrails?: Total Help needed moving from lying on your back to sitting on the side of a flat bed without using bedrails?: Total Help needed moving to and from a bed to a chair (including a wheelchair)?: Total Help needed standing up from a chair using your arms (e.g., wheelchair or bedside chair)?: Total Help needed to walk in hospital room?: Total Help needed climbing 3-5 steps with a railing? : Total 6 Click Score: 6    End of Session   Activity Tolerance: Patient limited by fatigue Patient left: in chair;with call bell/phone within reach;with chair alarm set Nurse Communication: Mobility status;Need for lift equipment PT Visit Diagnosis: Other abnormalities of gait and mobility (R26.89);Muscle weakness (generalized) (M62.81);Adult, failure to thrive (R62.7)     Time: 0370-4888 PT Time Calculation (min) (ACUTE ONLY): 42 min  Charges:  $Therapeutic Exercise: 8-22 mins $Therapeutic Activity: 8-22 mins                     05/08/2020  Ginger Carne., PT Acute Rehabilitation Services [phone]  (pager) [phone]  (office)   Tessie Fass Lennette Fader 05/08/2020, 1:44 PM

## 2020-05-08 NOTE — Progress Notes (Signed)
Subjective: 1 Day Post-Op Procedure(s) (LRB): AMPUTATION BELOW KNEE (Right)  Patient reports pain as mild to moderate.  Denies fever, chills, N/V, CP, SOB.  Resting comfortably this morning.    Objective:   VITALS:  Temp:  [97.4 F (36.3 C)-98.5 F (36.9 C)] 98.3 F (36.8 C) (08/06 0330) Pulse Rate:  [2-108] 101 (08/06 0730) Resp:  [10-29] 13 (08/06 0730) SpO2:  [95 %-100 %] 100 % (08/06 0730) Arterial Line BP: (80-156)/(36-59) 136/53 (08/06 0730)  General: WDWN patient in NAD. Psych:  Patient somnolent this morning. Neuro:  A&O x 3, Moving all extremities, sensation intact to light touch HEENT:  EOMs intact Chest:  Even non-labored respirations Skin:  Dressing C/D/I, no rashes or lesions Extremities: warm/dry, no visible edema, erythema or echymosis.  No lymphadenopathy. Pulses: Femoral 2+ MSK:  ROM: TKE, MMT: able to perform quad set    LABS Recent Labs    05/06/20 0547 05/06/20 0547 05/07/20 0559 05/07/20 2018 05/08/20 0434  HGB 8.1*  --  7.9* 9.2* 8.7*  WBC 16.8*   < > 15.9*  --  12.3*  PLT 264   < > 245  --  170   < > = values in this interval not displayed.   Recent Labs    05/07/20 0559 05/08/20 0434  NA 138 139  K 5.0 4.9  CL 113* 112*  CO2 15* 15*  BUN 52* 51*  CREATININE 2.88* 2.79*  GLUCOSE 126* 134*   No results for input(s): LABPT, INR in the last 72 hours.   Assessment/Plan: 1 Day Post-Op Procedure(s) (LRB): AMPUTATION BELOW KNEE (Right)  NWB R LE Reinforce dressing prn Up with therapy Stump protector and shrinker sock ordered. Stump protector at all times once obtained Plan for 2 week outpatient post-op visit with Dr. Doran Durand.  Mechele Claude PA-C [facility] Office:  [phone]

## 2020-05-08 NOTE — Progress Notes (Signed)
Subjective:  1200 UOP- crt pretty stable--  POD # 1 BKA  Objective Vital signs in last 24 hours: Vitals:   05/08/20 0730 05/08/20 0800 05/08/20 0809 05/08/20 0900  BP:      Pulse: (!) 101 99  (!) 111  Resp: 13 16  16   Temp:   (!) 97.5 F (36.4 C)   TempSrc:   Oral   SpO2: 100% 100%  100%  Weight:      Height:       Weight change:   Intake/Output Summary (Last 24 hours) at 05/08/2020 1023 Last data filed at 05/08/2020 0900 Gross per 24 hour  Intake 1678.17 ml  Output 1360 ml  Net 318.17 ml    Assessment/ Plan: Pt is a 59 y.o. yo female with DM, HTN, Afib, s/p renal transplant who was admitted on 05/01/2020 with worsening drainage from right foot ulcer  Assessment/Plan: 1. S/p renal transplant-  Second transplant at [facility] in September of 2020-  C/b delayed graft function.  Allograft function is not great with crt of around 3 - no acute change with hospitalization. 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  Some controversy regarding prograf dose- put on what we think -  Level high- doseage taken down slightly and to recheck level.  UOP reasonable on lasix but want to achieve diuresis so will inc dose-  crt staying poor but stable  2. Foot ulcer drainage-  Imaging not showing overt osteo.  On zosyn -  Per primary team -  Now s/p BKA 3. Anemia- fairly advanced, likely due to CKD-   giving ESA- no iron due to infection 4. HTN/volume- hypotension,  Proven to not be an issue once a line placed. All parameters in place to raise BP have been stopped.  Will continue lasix which will help her potassium as well  5. FTT-  Therapies in place,  Albumin of 1.5 gives poor prognosis - on prosource  6. Metabolic acidosis-   oral bicarb - have inc dose-- hope will improve since has had BKA  [name]    Labs: Basic Metabolic Panel: Recent Labs  Lab 05/06/20 0547 05/07/20 0559 05/08/20 0434  NA 136 138 139  K 5.2* 5.0 4.9  CL 114* 113* 112*  CO2 14* 15*  15*  GLUCOSE 181* 126* 134*  BUN 46* 52* 51*  CREATININE 2.79* 2.88* 2.79*  CALCIUM 8.5* 8.6* 8.4*  PHOS  --   --  3.0   Liver Function Tests: Recent Labs  Lab 05/01/20 1500 05/02/20 0106 05/08/20 0434  AST 18 21  --   ALT 9 10  --   ALKPHOS 287* 301*  --   BILITOT 0.8 0.9  --   PROT 5.2* 5.3*  --   ALBUMIN 1.5* 1.5* 1.5*   No results for input(s): LIPASE, AMYLASE in the last 168 hours. No results for input(s): AMMONIA in the last 168 hours. CBC: Recent Labs  Lab 05/04/20 0154 05/04/20 0154 05/05/20 0208 05/05/20 0208 05/06/20 0547 05/06/20 0547 05/07/20 0559 05/07/20 2018 05/08/20 0434  WBC 18.8*   < > 19.9*   < > 16.8*  --  15.9*  --  12.3*  NEUTROABS 15.3*   < > 17.7*   < > 13.7*  --  15.3*  --  9.5*  HGB 8.7*   < > 8.5*   < > 8.1*   < > 7.9* 9.2* 8.7*  HCT 27.8*   < > 27.4*   < >  25.5*   < > 24.8* 28.2* 26.7*  MCV 100.0  --  102.6*  --  99.2  --  99.6  --  92.7  PLT 261   < > 257   < > 264  --  245  --  170   < > = values in this interval not displayed.   Cardiac Enzymes: No results for input(s): CKTOTAL, CKMB, CKMBINDEX, TROPONINI in the last 168 hours. CBG: Recent Labs  Lab 05/07/20 0746 05/07/20 1146 05/07/20 1331 05/07/20 2131 05/08/20 0834  GLUCAP 122* 118* 91 138* 110*    Iron Studies: No results for input(s): IRON, TIBC, TRANSFERRIN, FERRITIN in the last 72 hours. Studies/Results: No results found. Medications: Infusions: . piperacillin-tazobactam (ZOSYN)  IV 12.5 mL/hr at 05/08/20 0900    Scheduled Medications: . (feeding supplement) PROSource Plus  30 mL Oral BID BM  . sodium chloride   Intravenous Once  . amiodarone  200 mg Oral Daily  . Chlorhexidine Gluconate Cloth  6 each Topical Daily  . darbepoetin (ARANESP) injection - NON-DIALYSIS  150 mcg Subcutaneous Q Mon-1800  . docusate sodium  100 mg Oral BID  . feeding supplement (ENSURE ENLIVE)  237 mL Oral TID BM  . folic acid  1 mg Oral Daily  . furosemide  40 mg Intravenous Q12H   . insulin aspart  0-15 Units Subcutaneous TID WC  . insulin aspart  0-5 Units Subcutaneous QHS  . lidocaine  5 mL Intradermal Once  . multivitamin with minerals  1 tablet Oral Daily  . nortriptyline  25 mg Oral QHS  . pantoprazole  40 mg Oral Daily  . predniSONE  5 mg Oral Q breakfast  . senna-docusate  1 tablet Oral Daily  . sodium bicarbonate  1,300 mg Oral BID  . tacrolimus  1 mg Oral BID  . thiamine  100 mg Oral Daily  . vitamin C  250 mg Oral BID  . zinc sulfate  220 mg Oral Daily    have reviewed scheduled and prn medications.  Physical Exam: General:  Resting but arousable- no c/o's-   flat Heart: RRR Lungs: mostly clear Abdomen: soft, non tender Extremities: pitting edema to dep areas -  bilat boots and bandages--- has right upper arm AVF that is patent      05/08/2020,10:23 AM  LOS: 7 days

## 2020-05-08 NOTE — Progress Notes (Signed)
Brief Nutrition Note:   Full assessment completed 8/05.  Pt po intake remains poor; only eating 0-5% of meals today. Pt eating <25% of meals since admission x 1 week  POD day #1 R. AKA. Pt with some pain, no N/V  Given the presence of malnutrition with poor po intake and increased nutritional needs related to wound and post-op healing, recommend insertion of Cortrak tube with initiation of supplemental TF. Discussed with MD who agrees with plan.  Labs and Meds Reviewed.   Interventions:   Tube Feeding via Cortrak:  Vital 1.5 at 55 ml/hr Pro-Source TF 45 mL BID Provides 2040 kcals, 111 g of protein and 1003 mL of free water  Continue Ensure Enlive po TID, each supplement provides 350 kcal and 20 grams of protein  Monitor po intake and adjust TF regimen as appropriate. If tolerates continuous feedings, can consider transitioning to nocturnal TF    Kerman Passey MS, RDN, LDN, CNSC Registered Dietitian III Clinical Nutrition RD Pager and On-Call Pager Number Located in [place]

## 2020-05-09 LAB — RENAL FUNCTION PANEL
Albumin: 1.4 g/dL — ABNORMAL LOW (ref 3.5–5.0)
Anion gap: 11 (ref 5–15)
BUN: 52 mg/dL — ABNORMAL HIGH (ref 6–20)
CO2: 14 mmol/L — ABNORMAL LOW (ref 22–32)
Calcium: 8.1 mg/dL — ABNORMAL LOW (ref 8.9–10.3)
Chloride: 111 mmol/L (ref 98–111)
Creatinine, Ser: 2.93 mg/dL — ABNORMAL HIGH (ref 0.44–1.00)
GFR calc Af Amer: 19 mL/min — ABNORMAL LOW (ref >60)
GFR calc non Af Amer: 17 mL/min — ABNORMAL LOW (ref >60)
Glucose, Bld: 222 mg/dL — ABNORMAL HIGH (ref 70–99)
Phosphorus: 2.9 mg/dL (ref 2.5–4.6)
Potassium: 4.8 mmol/L (ref 3.5–5.1)
Sodium: 136 mmol/L (ref 135–145)

## 2020-05-09 LAB — CBC WITH DIFFERENTIAL/PLATELET
Abs Immature Granulocytes: 0.16 10*3/uL — ABNORMAL HIGH (ref 0.00–0.07)
Basophils Absolute: 0 10*3/uL (ref 0.0–0.1)
Basophils Relative: 0 %
Eosinophils Absolute: 0 10*3/uL (ref 0.0–0.5)
Eosinophils Relative: 0 %
HCT: 25.3 % — ABNORMAL LOW (ref 36.0–46.0)
Hemoglobin: 8.2 g/dL — ABNORMAL LOW (ref 12.0–15.0)
Immature Granulocytes: 1 %
Lymphocytes Relative: 11 %
Lymphs Abs: 1.5 10*3/uL (ref 0.7–4.0)
MCH: 30.3 pg (ref 26.0–34.0)
MCHC: 32.4 g/dL (ref 30.0–36.0)
MCV: 93.4 fL (ref 80.0–100.0)
Monocytes Absolute: 0.5 10*3/uL (ref 0.1–1.0)
Monocytes Relative: 4 %
Neutro Abs: 11.2 10*3/uL — ABNORMAL HIGH (ref 1.7–7.7)
Neutrophils Relative %: 84 %
Platelets: 185 10*3/uL (ref 150–400)
RBC: 2.71 MIL/uL — ABNORMAL LOW (ref 3.87–5.11)
RDW: 17.7 % — ABNORMAL HIGH (ref 11.5–15.5)
WBC: 13.4 10*3/uL — ABNORMAL HIGH (ref 4.0–10.5)
nRBC: 0 % (ref 0.0–0.2)

## 2020-05-09 LAB — PHOSPHORUS: Phosphorus: 2.6 mg/dL (ref 2.5–4.6)

## 2020-05-09 LAB — MAGNESIUM
Magnesium: 2.2 mg/dL (ref 1.7–2.4)
Magnesium: 2 mg/dL (ref 1.7–2.4)

## 2020-05-09 LAB — GLUCOSE, CAPILLARY
Glucose-Capillary: 164 mg/dL — ABNORMAL HIGH (ref 70–99)
Glucose-Capillary: 216 mg/dL — ABNORMAL HIGH (ref 70–99)
Glucose-Capillary: 240 mg/dL — ABNORMAL HIGH (ref 70–99)
Glucose-Capillary: 231 mg/dL — ABNORMAL HIGH (ref 70–99)
Glucose-Capillary: 218 mg/dL — ABNORMAL HIGH (ref 70–99)
Glucose-Capillary: 195 mg/dL — ABNORMAL HIGH (ref 70–99)
Glucose-Capillary: 191 mg/dL — ABNORMAL HIGH (ref 70–99)

## 2020-05-09 LAB — POCT I-STAT 7, (LYTES, BLD GAS, ICA,H+H)
Acid-base deficit: 10 mmol/L — ABNORMAL HIGH (ref 0.0–2.0)
Bicarbonate: 14.2 mmol/L — ABNORMAL LOW (ref 20.0–28.0)
Calcium, Ion: 1.34 mmol/L (ref 1.15–1.40)
HCT: 16 % — ABNORMAL LOW (ref 36.0–46.0)
Hemoglobin: 5.4 g/dL — CL (ref 12.0–15.0)
O2 Saturation: 97 %
Patient temperature: 36
Potassium: 5.1 mmol/L (ref 3.5–5.1)
Sodium: 142 mmol/L (ref 135–145)
TCO2: 15 mmol/L — ABNORMAL LOW (ref 22–32)
pCO2 arterial: 23.3 mmHg — ABNORMAL LOW (ref 32.0–48.0)
pH, Arterial: 7.39 (ref 7.350–7.450)
pO2, Arterial: 85 mmHg (ref 83.0–108.0)

## 2020-05-09 LAB — C-REACTIVE PROTEIN: CRP: 10.4 mg/dL — ABNORMAL HIGH (ref <1.0)

## 2020-05-09 NOTE — Progress Notes (Signed)
Subjective:  1500 UOP- crt pretty stable--  POD # 2 BKA-  Pt tired but arousable   Objective Vital signs in last 24 hours: Vitals:   05/09/20 0500 05/09/20 0600 05/09/20 0700 05/09/20 0741  BP:      Pulse:  (!) 108 (!) 110   Resp: 16 19 19    Temp:    98.1 F (36.7 C)  TempSrc:    Oral  SpO2: 100% 100% 100%   Weight: 79 kg     Height:       Weight change:   Intake/Output Summary (Last 24 hours) at 05/09/2020 0901 Last data filed at 05/09/2020 0900 Gross per 24 hour  Intake 588.04 ml  Output 1525 ml  Net -936.96 ml    Assessment/ Plan: Pt is a 59 y.o. yo female with DM, HTN, Afib, s/p renal transplant who was admitted on 05/01/2020 with worsening drainage from right foot ulcer  Assessment/Plan: 1. S/p renal transplant-  Second transplant at [facility] in September of 2020-  C/b delayed graft function.  Allograft function is not great with crt of around 3 - no acute change with hospitalization. 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  Some controversy regarding prograf dose- put on what we think -  Level high- doseage taken down slightly and to recheck level.  UOP better on lasix - want to achieve diuresis-  crt staying poor but stable  2. Foot ulcer drainage-  Imaging not showing overt osteo.  On zosyn -  Per primary team -  Now s/p BKA 3. Anemia- fairly advanced, likely due to CKD-   giving ESA- no iron due to infection 4. HTN/volume- hypotension,  Proven to not be an issue once a line placed. All parameters in place to raise BP have been stopped.  Will continue lasix at same dose  5. FTT-  Therapies in place,  Albumin of 1.4 gives poor prognosis - on prosource -  Now cortrak placed  6. Metabolic acidosis-   oral bicarb - have inc dose-- hope will improve since has had BKA- not yet   [name]    Labs: Basic Metabolic Panel: Recent Labs  Lab 05/07/20 0559 05/07/20 0559 05/07/20 1556 05/08/20 0434 05/08/20 2040 05/09/20 0611  NA 138   < >  142 139  --  136  K 5.0   < > 5.1 4.9  --  4.8  CL 113*  --   --  112*  --  111  CO2 15*  --   --  15*  --  14*  GLUCOSE 126*  --   --  134*  --  222*  BUN 52*  --   --  51*  --  52*  CREATININE 2.88*  --   --  2.79*  --  2.93*  CALCIUM 8.6*  --   --  8.4*  --  8.1*  PHOS  --   --   --  3.0 2.9 2.9   < > = values in this interval not displayed.   Liver Function Tests: Recent Labs  Lab 05/08/20 0434 05/09/20 0611  ALBUMIN 1.5* 1.4*   No results for input(s): LIPASE, AMYLASE in the last 168 hours. No results for input(s): AMMONIA in the last 168 hours. CBC: Recent Labs  Lab 05/05/20 0208 05/05/20 0208 05/06/20 0547 05/06/20 0547 05/07/20 0559 05/07/20 1556 05/07/20 2018 05/08/20 0434 05/09/20 0611  WBC 19.9*   < > 16.8*   < >  15.9*  --   --  12.3* 13.4*  NEUTROABS 17.7*   < > 13.7*   < > 15.3*  --   --  9.5* 11.2*  HGB 8.5*   < > 8.1*   < > 7.9*   < > 9.2* 8.7* 8.2*  HCT 27.4*   < > 25.5*   < > 24.8*   < > 28.2* 26.7* 25.3*  MCV 102.6*  --  99.2  --  99.6  --   --  92.7 93.4  PLT 257   < > 264   < > 245  --   --  170 185   < > = values in this interval not displayed.   Cardiac Enzymes: No results for input(s): CKTOTAL, CKMB, CKMBINDEX, TROPONINI in the last 168 hours. CBG: Recent Labs  Lab 05/08/20 1916 05/08/20 2259 05/08/20 2335 05/09/20 0355 05/09/20 0738  GLUCAP 130* 146* 150* 164* 216*    Iron Studies: No results for input(s): IRON, TIBC, TRANSFERRIN, FERRITIN in the last 72 hours. Studies/Results: No results found. Medications: Infusions: . feeding supplement (VITAL 1.5 CAL) 1,000 mL (05/09/20 0600)  . piperacillin-tazobactam (ZOSYN)  IV 12.5 mL/hr at 05/09/20 0900    Scheduled Medications: . sodium chloride   Intravenous Once  . amiodarone  200 mg Oral Daily  . apixaban  5 mg Oral BID  . Chlorhexidine Gluconate Cloth  6 each Topical Daily  . darbepoetin (ARANESP) injection - NON-DIALYSIS  150 mcg Subcutaneous Q Mon-1800  . feeding supplement  (ENSURE ENLIVE)  237 mL Oral TID BM  . feeding supplement (PROSource TF)  45 mL Per Tube BID  . folic acid  1 mg Oral Daily  . furosemide  80 mg Intravenous Q12H  . insulin aspart  0-15 Units Subcutaneous TID WC  . insulin aspart  0-5 Units Subcutaneous QHS  . lidocaine  5 mL Intradermal Once  . multivitamin with minerals  1 tablet Oral Daily  . nortriptyline  25 mg Oral QHS  . pantoprazole  40 mg Oral Daily  . predniSONE  5 mg Oral Q breakfast  . senna-docusate  1 tablet Oral Daily  . sodium bicarbonate  1,300 mg Oral BID  . tacrolimus  1 mg Oral BID  . thiamine  100 mg Oral Daily  . vitamin C  250 mg Oral BID  . zinc sulfate  220 mg Oral Daily    have reviewed scheduled and prn medications.  Physical Exam: General:  Resting but arousable- no c/o's-   flat Heart: RRR Lungs: mostly clear Abdomen: soft, non tender Extremities: pitting edema to dep areas -  bilat boots and bandages--- has right upper arm AVF that is patent      05/09/2020,9:01 AM  LOS: 8 days

## 2020-05-09 NOTE — Progress Notes (Signed)
Pharmacy Antibiotic Note  Wanda [name] is a 59 y.o. female admitted on 05/01/2020 with right foot ulcer drainage and fever.  Pharmacy has been consulted to transition from vancomycin and ceftriaxone to Zosyn for cellulitis.    Renal function worsening, afebrile, WBC up 13.4, LA 0.9.  Plan: Continue Zosyn EID 3.375gm IV Q8H Monitor renal fxn, clinical course, abx LOT  Height: 5\' 9"  (175.3 cm) Weight: 79 kg (174 lb 2.6 oz) IBW/kg (Calculated) : 66.2  Temp (24hrs), Avg:97.8 F (36.6 C), Min:97.4 F (36.3 C), Max:98.1 F (36.7 C)  Recent Labs  Lab 05/05/20 0208 05/05/20 0840 05/06/20 0547 05/07/20 0559 05/08/20 0434 05/09/20 0611  WBC 19.9*  --  16.8* 15.9* 12.3* 13.4*  CREATININE 2.69*  --  2.79* 2.88* 2.79* 2.93*  LATICACIDVEN  --  0.9  --   --   --   --     Estimated Creatinine Clearance: 21.6 mL/min (A) (by C-G formula based on SCr of 2.93 mg/dL (H)).    Allergies  Allergen Reactions  . Propoxyphene N-Acetaminophen Other (See Comments)    Severe headache  . Hydrocodone-Acetaminophen Other (See Comments)     Severe headache   . Astemizole Rash and Other (See Comments)    headache   . Cephalosporins Rash    Vanc 7/30 >> 8/3 CTX 7/30 >> 8/3 Zosyn 8/3 >>  7/30 covid / MRSA PCR - negative 7/30 UCx - suggest recollection 7/30 BCx - NGTD 8/3 UCx - suggest recollection  Ivannia Willhelm D. Mina Marble, PharmD, BCPS, [address] 05/09/2020, 11:32 AM

## 2020-05-09 NOTE — Progress Notes (Signed)
Pressure Bag changed.  A-line dressing changed without any complications.

## 2020-05-09 NOTE — Progress Notes (Addendum)
Subjective: 2 Days Post-Op Procedure(s) (LRB): AMPUTATION BELOW KNEE (Right) Patient reports pain as mild.   Patient seen in rounds for Dr. Doran Durand. Patient is well, and has had no acute complaints or problems. Patient is resting in bed this morning on exam. She reports she slept well last night.    Objective: Vital signs in last 24 hours: Temp:  [97.4 F (36.3 C)-98.1 F (36.7 C)] 98.1 F (36.7 C) (08/07 0741) Pulse Rate:  [94-110] 106 (08/07 0900) Resp:  [13-26] 17 (08/07 0900) BP: (56)/(45) 56/45 (08/06 1400) SpO2:  [96 %-100 %] 100 % (08/07 0900) Arterial Line BP: (110-162)/(50-65) 159/63 (08/07 0900) Weight:  [79 kg] 79 kg (08/07 0500)  Intake/Output from previous day:  Intake/Output Summary (Last 24 hours) at 05/09/2020 0919 Last data filed at 05/09/2020 0900 Gross per 24 hour  Intake 588.04 ml  Output 1525 ml  Net -936.96 ml    Intake/Output this shift: Total I/O In: 265.1 [P.O.:240; IV Piggyback:25.1] Out: -   Labs: Recent Labs    05/07/20 0559 05/07/20 1556 05/07/20 2018 05/08/20 0434 05/09/20 0611  HGB 7.9* 5.4* 9.2* 8.7* 8.2*   Recent Labs    05/08/20 0434 05/09/20 0611  WBC 12.3* 13.4*  RBC 2.88* 2.71*  HCT 26.7* 25.3*  PLT 170 185   Recent Labs    05/08/20 0434 05/09/20 0611  NA 139 136  K 4.9 4.8  CL 112* 111  CO2 15* 14*  BUN 51* 52*  CREATININE 2.79* 2.93*  GLUCOSE 134* 222*  CALCIUM 8.4* 8.1*   No results for input(s): LABPT, INR in the last 72 hours.  Exam: General - Patient is Alert and Oriented Extremity - Neurologically intact Sensation intact distally Compartment soft Dressing/Incision - clean, dry. Stump protector in place on RLE.   Past Medical History:  Diagnosis Date  . Anemia   . Arthritis    "hands" (07/24/2013) pt denies  . Chronic kidney disease, stage IV (severe) ([place])    "never went on dialysis" (07/24/2013)  . Diabetes mellitus type II   . Diabetic peripheral neuropathy ([place])   . Dialysis patient  [facility])    Monday Wednesday Friday   . Dyspnea on exertion    pt denies  . Foot pain   . GERD (gastroesophageal reflux disease)   . Gout   . History of chicken pox   . History of measles   . History of mumps   . Hyperlipidemia   . Hypothyroidism   . Migraines    "q 3 months" (07/24/2013)  . Nonspecific abnormal unspecified cardiovascular function study   . Obesity   . PAD (peripheral artery disease) ([place])   . Peripheral neuropathy   . Peripheral vascular disease ([place])   . Pneumonia 09/2012  . Renal transplant disorder   . Shingles   . Sleep apnea    "getting ready to get tested again cause dr says I have it" (07/24/2013) pt states was tested and was told she did not have sleep apnea / no cpap  . Unspecified essential hypertension     Assessment/Plan: 2 Days Post-Op Procedure(s) (LRB): AMPUTATION BELOW KNEE (Right) Principal Problem:   Septic shock (HCC) Active Problems:   DM (diabetes mellitus), type 2 with renal complications (HCC)   Essential hypertension   CHRONIC KIDNEY DISEASE STAGE IV (SEVERE)   Renal transplant disorder   Depression   History of kidney transplant   Wound infection   Leukocytosis   Pressure injury of skin  Estimated body mass  index is 25.72 kg/m as calculated from the following:   Height as of this encounter: 5\' 9"  (1.753 m).   Weight as of this encounter: 79 kg.  Plan: NWB R LE Reinforce dressing prn Up with therapy Stump protector at all times.  Continue Zosyn for now, Dr. Doran Durand to determine definitive length of therapy on Monday   Plan for 2 week outpatient post-op visit with Dr. Doran Durand.   Griffith Citron, PA-C Orthopedic Surgery [phone] 05/09/2020, 9:19 AM

## 2020-05-09 NOTE — Progress Notes (Signed)
Patient ID: Wanda [name], female   DOB: May 27, 1961, 59 y.o.   MRN: [id number]  PROGRESS NOTE    Wanda [name]  IFO:[id number] DOB: [birth year] DOA: 05/01/2020 PCP: Lorene Dy, MD   Brief Narrative:   59 y.o. female with medical history significant of diabetes type 2, hypertension, paroxysmal A. fib on Eliquis, hyperlipidemia, ESRD with failed kidney transplant in 2013 and repeat transplant in September 2020 at [facility] with current chronic renal disease stage IV, anemia of chronic disease, history of gastric sleeve surgery, hypoalbuminemia, right foot ulcer, chronic neuropathy presented with worsening right foot ulcer drainage getting worse over the last [redacted] weeks along with fever for 2 days.  On presentation, she was febrile with leukocytosis; x-ray of the right foot did not show any evidence of osteomyelitis.  She was started on IV fluids and antibiotics.  Assessment & Plan:   Sepsis: Present on admission Right foot ulcer infection with surrounding cellulitis,  -Patient presented with worsening right foot ulcer drainage over the last [redacted] weeks along with fever for the last couple of days.  Right foot x-ray does not show evidence of osteomyelitis. -Cultures negative so far. -MRI of the right foot without contrast did not show any evidence of osteomyelitis but showed superficial phlegmon overlying the posterior calcaneus. -Wound care evaluation appreciated.   -Recent lower extremity arterial duplex did not show any significant stenosis involving the visualized arteries of bilateral lower extremities Orthopedic evaluation/assistance appreciated.  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  Patient remains on Zosyn.  No mention of discontinuing antibiotics in the note.  I have sent secure chat message to the rounding orthopedic PA today for clearance on that.  Management per orthopedic.  Hypotension: Per chart review, it sounds like patient's blood pressure was thought to be low based on the noninvasive measurement  and for this reason, patient was transferred to ICU on 05/05/2020.  A line was placed and blood pressure to A-line was showing hypertension.  This indicates pseudohypotension monitor by external devices.  PCCM saw patient and signed off.  Patient's blood pressure has remained stable.  She has been switched back to oral prednisone.  Chronic kidney disease stage IV Metabolic acidosis History of ESRD status post renal transplant on chronic immunosuppressive medications -Baseline creatinine around 2.90-3.  Creatinine on presentation was 3.09.  Creatinine and bicarb has remained stable since last few days with creatinine around 2.9 and bicarb around 15.  Nephrology managing and she is on high-dose of IV Lasix now.  Moderate to severe protein calorie malnutrition/cachexia: Assessed by nutrition yesterday.  She is not eating enough.  Need supplemental.  Cortrak was placed and nutrition started on 05/08/2020.  Patient has significant pitting edema in bilateral upper and lower extremities.  She is on Lasix already.  Wonder if albumin will be needed.  Defer to nephrology.  Paroxysmal A. Fib -Held Eliquis due to planned surgical intervention/BKA.  Now s/p BKA.  Per my discussion with orthopedic PA yesterday, they cleared her for resuming Eliquis.  Eliquis has been resumed on the night of 05/08/2020.  Anemia of chronic disease -No signs of bleeding.    Hemoglobin 8.2 today.  Cachexia Hypoalbuminemia -Nutrition following.  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  CBGs with SSI.  Carb modified diet.   DVT prophylaxis: Heparin Code Status: Full Family Communication: None at bedside Disposition Plan: Status is: Inpatient  Remains inpatient appropriate because:Inpatient level of care appropriate due to severity of illness   Dispo: The patient is from:  Home              Anticipated d/c is to: Home              Anticipated d/c date is: > 3 days              Patient  currently is not medically stable to d/c.   Consultants: Nephrology/orthopedics  Procedures: None  Antimicrobials:    Subjective: Patient seen and examined.  Slightly lethargic but easily arousable.  Denied any complaint.  Objective: Vitals:   05/09/20 1000 05/09/20 1100 05/09/20 1155 05/09/20 1200  BP:      Pulse: (!) 107 (!) 107  (!) 101  Resp: 10 15  17   Temp:   99 F (37.2 C)   TempSrc:   Oral   SpO2: 100% 100%  100%  Weight:      Height:        Intake/Output Summary (Last 24 hours) at 05/09/2020 1255 Last data filed at 05/09/2020 1200 Gross per 24 hour  Intake 1105.92 ml  Output 1125 ml  Net -19.08 ml   Filed Weights   05/01/20 1444 05/01/20 2345 05/09/20 0500  Weight: 78.5 kg 70.1 kg 79 kg    Examination:  General exam: Appears calm and comfortable but lethargic Respiratory system: Clear to auscultation. Respiratory effort normal. Cardiovascular system: S1 & S2 heard, RRR. No JVD, murmurs, rubs, gallops or clicks.  +2 pitting edema all 4 extremities Gastrointestinal system: Abdomen is nondistended, soft and nontender. No organomegaly or masses felt. Normal bowel sounds heard. Central nervous system: Lethargic and oriented. No focal neurological deficits. Extremities: Right BKA Skin: No rashes, lesions or ulcers.   Data Reviewed: I have personally reviewed following labs and imaging studies  CBC: Recent Labs  Lab 05/05/20 0208 05/05/20 0208 05/06/20 0547 05/06/20 0547 05/07/20 0559 05/07/20 1556 05/07/20 2018 05/08/20 0434 05/09/20 0611  WBC 19.9*  --  16.8*  --  15.9*  --   --  12.3* 13.4*  NEUTROABS 17.7*  --  13.7*  --  15.3*  --   --  9.5* 11.2*  HGB 8.5*   < > 8.1*   < > 7.9* 5.4* 9.2* 8.7* 8.2*  HCT 27.4*   < > 25.5*   < > 24.8* 16.0* 28.2* 26.7* 25.3*  MCV 102.6*  --  99.2  --  99.6  --   --  92.7 93.4  PLT 257  --  264  --  245  --   --  170 185   < > = values in this interval not displayed.   Basic Metabolic Panel: Recent Labs  Lab  05/05/20 0208 05/05/20 0208 05/06/20 0547 05/07/20 0559 05/07/20 1556 05/08/20 0434 05/08/20 2040 05/09/20 0611  NA 136   < > 136 138 142 139  --  136  K 5.1   < > 5.2* 5.0 5.1 4.9  --  4.8  CL 110  --  114* 113*  --  112*  --  111  CO2 14*  --  14* 15*  --  15*  --  14*  GLUCOSE 160*  --  181* 126*  --  134*  --  222*  BUN 45*  --  46* 52*  --  51*  --  52*  CREATININE 2.69*  --  2.79* 2.88*  --  2.79*  --  2.93*  CALCIUM 8.2*  --  8.5* 8.6*  --  8.4*  --  8.1*  MG 1.8   < >  1.9 1.8  --  1.8 2.2 2.2  PHOS  --   --   --   --   --  3.0 2.9 2.9   < > = values in this interval not displayed.   GFR: Estimated Creatinine Clearance: 21.6 mL/min (A) (by C-G formula based on SCr of 2.93 mg/dL (H)). Liver Function Tests: Recent Labs  Lab 05/08/20 0434 05/09/20 0611  ALBUMIN 1.5* 1.4*   No results for input(s): LIPASE, AMYLASE in the last 168 hours. No results for input(s): AMMONIA in the last 168 hours. Coagulation Profile: No results for input(s): INR, PROTIME in the last 168 hours. Cardiac Enzymes: No results for input(s): CKTOTAL, CKMB, CKMBINDEX, TROPONINI in the last 168 hours. BNP (last 3 results) No results for input(s): PROBNP in the last 8760 hours. HbA1C: No results for input(s): HGBA1C in the last 72 hours. CBG: Recent Labs  Lab 05/08/20 2259 05/08/20 2335 05/09/20 0355 05/09/20 0738 05/09/20 1129  GLUCAP 146* 150* 164* 216* 240*   Lipid Profile: No results for input(s): CHOL, HDL, LDLCALC, TRIG, CHOLHDL, LDLDIRECT in the last 72 hours. Thyroid Function Tests: No results for input(s): TSH, T4TOTAL, FREET4, T3FREE, THYROIDAB in the last 72 hours. Anemia Panel: No results for input(s): VITAMINB12, FOLATE, FERRITIN, TIBC, IRON, RETICCTPCT in the last 72 hours. Sepsis Labs: Recent Labs  Lab 05/05/20 0840  LATICACIDVEN 0.9    Recent Results (from the past 240 hour(s))  Blood Culture (routine x 2)     Status: None   Collection Time: 05/01/20  3:03 PM    Specimen: BLOOD  Result Value Ref Range Status   Specimen Description BLOOD BLOOD RIGHT FOREARM  Final   Special Requests   Final    BOTTLES DRAWN AEROBIC AND ANAEROBIC Blood Culture results may not be optimal due to an inadequate volume of blood received in culture bottles   Culture   Final    NO GROWTH 5 DAYS Performed at [facility], [place] [address], [place], [place] [address]    Report Status 05/06/2020 FINAL  Final  Blood Culture (routine x 2)     Status: None   Collection Time: 05/01/20  3:15 PM   Specimen: BLOOD LEFT ARM  Result Value Ref Range Status   Specimen Description BLOOD LEFT ARM  Final   Special Requests   Final    BOTTLES DRAWN AEROBIC AND ANAEROBIC Blood Culture results may not be optimal due to an inadequate volume of blood received in culture bottles   Culture   Final    NO GROWTH 5 DAYS Performed at [facility], [place] [address] [place], [place] [address]    Report Status 05/06/2020 FINAL  Final  SARS Coronavirus 2 by RT PCR (hospital order, performed in [facility] hospital lab) Nasopharyngeal Nasopharyngeal Swab     Status: None   Collection Time: 05/01/20  5:27 PM   Specimen: Nasopharyngeal Swab  Result Value Ref Range Status   SARS Coronavirus 2 NEGATIVE NEGATIVE Final    Comment: (NOTE) SARS-CoV-2 target nucleic acids are NOT DETECTED.  The SARS-CoV-2 RNA is generally detectable in upper and lower respiratory specimens during the acute phase of infection. The lowest concentration of SARS-CoV-2 viral copies this assay can detect is 250 copies / mL. A negative result does not preclude SARS-CoV-2 infection and should not be used as the sole basis for treatment or other patient management decisions.  A negative result may occur with improper specimen collection / handling, submission of specimen  other than nasopharyngeal swab, presence of viral mutation(s) within the areas targeted by this assay, and inadequate number of viral copies (<250  copies / mL). A negative result must be combined with clinical observations, patient history, and epidemiological information.  Fact Sheet for Patients:   StrictlyIdeas.no  Fact Sheet for Healthcare Providers: BankingDealers.co.za  This test is not yet approved or  cleared by the [place] FDA and has been authorized for detection and/or diagnosis of SARS-CoV-2 by FDA under an Emergency Use Authorization (EUA).  This EUA will remain in effect (meaning this test can be used) for the duration of the COVID-19 declaration under Section 564(b)(1) of the Act, 21 U.S.C. section 360bbb-3(b)(1), unless the authorization is terminated or revoked sooner.  Performed at [facility], [place] [address] [place], [place] [address]   Urine culture     Status: Abnormal   Collection Time: 05/01/20  8:11 PM   Specimen: In/Out Cath Urine  Result Value Ref Range Status   Specimen Description IN/OUT CATH URINE  Final   Special Requests   Final    NONE Performed at [facility], [place] [address], [place], [place] [address]    Culture MULTIPLE SPECIES PRESENT, SUGGEST RECOLLECTION (A)  Final   Report Status 05/03/2020 FINAL  Final  MRSA PCR Screening     Status: None   Collection Time: 05/01/20 11:38 PM   Specimen: Nasal Mucosa; Nasopharyngeal  Result Value Ref Range Status   MRSA by PCR NEGATIVE NEGATIVE Final    Comment:        The GeneXpert MRSA Assay (FDA approved for NASAL specimens only), is one component of a comprehensive MRSA colonization surveillance program. It is not intended to diagnose MRSA infection nor to guide or monitor treatment for MRSA infections. Performed at [facility], [place] [address], [place], [place] [address]   Culture, Urine     Status: Abnormal   Collection Time: 05/05/20  8:30 AM   Specimen: Urine, Catheterized  Result Value Ref Range Status   Specimen Description URINE, CATHETERIZED  Final    Special Requests   Final    Immunocompromised Performed at [facility], [place] [address] [place], [place] [address]    Culture MULTIPLE SPECIES PRESENT, SUGGEST RECOLLECTION (A)  Final   Report Status 05/06/2020 FINAL  Final         Radiology Studies: No results found.      Scheduled Meds: . sodium chloride   Intravenous Once  . amiodarone  200 mg Oral Daily  . apixaban  5 mg Oral BID  . Chlorhexidine Gluconate Cloth  6 each Topical Daily  . darbepoetin (ARANESP) injection - NON-DIALYSIS  150 mcg Subcutaneous Q Mon-1800  . feeding supplement (ENSURE ENLIVE)  237 mL Oral TID BM  . feeding supplement (PROSource TF)  45 mL Per Tube BID  . folic acid  1 mg Oral Daily  . furosemide  80 mg Intravenous Q12H  . insulin aspart  0-15 Units Subcutaneous TID WC  . insulin aspart  0-5 Units Subcutaneous QHS  . lidocaine  5 mL Intradermal Once  . multivitamin with minerals  1 tablet Oral Daily  . nortriptyline  25 mg Oral QHS  . pantoprazole  40 mg Oral Daily  . predniSONE  5 mg Oral Q breakfast  . senna-docusate  1 tablet Oral Daily  . sodium bicarbonate  1,300 mg Oral BID  . tacrolimus  1 mg Oral BID  . thiamine  100 mg Oral Daily  . vitamin C  250 mg Oral BID  . zinc sulfate  220 mg Oral Daily   Continuous Infusions: . feeding supplement (VITAL 1.5 CAL) 1,000 mL (05/09/20 0600)  . piperacillin-tazobactam (ZOSYN)  IV Stopped (05/09/20 0958)   Total time spent 28 minutes  Darliss Cheney, MD [facility] 05/09/2020, 12:55 PM

## 2020-05-10 LAB — CBC
HCT: 22 % — ABNORMAL LOW (ref 36.0–46.0)
Hemoglobin: 6.8 g/dL — CL (ref 12.0–15.0)
MCH: 30.5 pg (ref 26.0–34.0)
MCHC: 30.9 g/dL (ref 30.0–36.0)
MCV: 98.7 fL (ref 80.0–100.0)
Platelets: 164 10*3/uL (ref 150–400)
RBC: 2.23 MIL/uL — ABNORMAL LOW (ref 3.87–5.11)
RDW: 17.5 % — ABNORMAL HIGH (ref 11.5–15.5)
WBC: 9.5 10*3/uL (ref 4.0–10.5)
nRBC: 0.2 % (ref 0.0–0.2)

## 2020-05-10 LAB — CBC WITH DIFFERENTIAL/PLATELET
Abs Immature Granulocytes: 0.11 10*3/uL — ABNORMAL HIGH (ref 0.00–0.07)
Basophils Absolute: 0 10*3/uL (ref 0.0–0.1)
Basophils Relative: 0 %
Eosinophils Absolute: 0.1 10*3/uL (ref 0.0–0.5)
Eosinophils Relative: 1 %
HCT: 23.5 % — ABNORMAL LOW (ref 36.0–46.0)
Hemoglobin: 7.3 g/dL — ABNORMAL LOW (ref 12.0–15.0)
Immature Granulocytes: 1 %
Lymphocytes Relative: 13 %
Lymphs Abs: 1.4 10*3/uL (ref 0.7–4.0)
MCH: 29.7 pg (ref 26.0–34.0)
MCHC: 31.1 g/dL (ref 30.0–36.0)
MCV: 95.5 fL (ref 80.0–100.0)
Monocytes Absolute: 0.3 10*3/uL (ref 0.1–1.0)
Monocytes Relative: 3 %
Neutro Abs: 9.3 10*3/uL — ABNORMAL HIGH (ref 1.7–7.7)
Neutrophils Relative %: 82 %
Platelets: 175 10*3/uL (ref 150–400)
RBC: 2.46 MIL/uL — ABNORMAL LOW (ref 3.87–5.11)
RDW: 17.7 % — ABNORMAL HIGH (ref 11.5–15.5)
WBC: 11.3 10*3/uL — ABNORMAL HIGH (ref 4.0–10.5)
nRBC: 0 % (ref 0.0–0.2)

## 2020-05-10 LAB — GLUCOSE, CAPILLARY
Glucose-Capillary: 223 mg/dL — ABNORMAL HIGH (ref 70–99)
Glucose-Capillary: 285 mg/dL — ABNORMAL HIGH (ref 70–99)
Glucose-Capillary: 319 mg/dL — ABNORMAL HIGH (ref 70–99)
Glucose-Capillary: 298 mg/dL — ABNORMAL HIGH (ref 70–99)
Glucose-Capillary: 271 mg/dL — ABNORMAL HIGH (ref 70–99)
Glucose-Capillary: 223 mg/dL — ABNORMAL HIGH (ref 70–99)

## 2020-05-10 LAB — RENAL FUNCTION PANEL
Albumin: 1.3 g/dL — ABNORMAL LOW (ref 3.5–5.0)
Anion gap: 14 (ref 5–15)
BUN: 56 mg/dL — ABNORMAL HIGH (ref 6–20)
CO2: 15 mmol/L — ABNORMAL LOW (ref 22–32)
Calcium: 7.8 mg/dL — ABNORMAL LOW (ref 8.9–10.3)
Chloride: 109 mmol/L (ref 98–111)
Creatinine, Ser: 2.94 mg/dL — ABNORMAL HIGH (ref 0.44–1.00)
GFR calc Af Amer: 19 mL/min — ABNORMAL LOW (ref >60)
GFR calc non Af Amer: 17 mL/min — ABNORMAL LOW (ref >60)
Glucose, Bld: 295 mg/dL — ABNORMAL HIGH (ref 70–99)
Phosphorus: 2.7 mg/dL (ref 2.5–4.6)
Potassium: 4.9 mmol/L (ref 3.5–5.1)
Sodium: 138 mmol/L (ref 135–145)

## 2020-05-10 LAB — PREPARE RBC (CROSSMATCH)

## 2020-05-10 LAB — C-REACTIVE PROTEIN: CRP: 8.1 mg/dL — ABNORMAL HIGH (ref <1.0)

## 2020-05-10 LAB — MAGNESIUM: Magnesium: 2 mg/dL (ref 1.7–2.4)

## 2020-05-10 MED ORDER — INSULIN ASPART 100 UNIT/ML ~~LOC~~ SOLN
2.0000 [IU] | SUBCUTANEOUS | Status: DC
  Administered 2020-05-10 – 2020-05-11 (×5): 2 [IU] via SUBCUTANEOUS

## 2020-05-10 MED ORDER — SODIUM CHLORIDE 0.9% IV SOLUTION: Freq: Once | INTRAVENOUS | Status: DC

## 2020-05-10 NOTE — Progress Notes (Addendum)
Subjective:  2600 UOP- crt pretty stable--  POD # 3 BKA-  Pt tired but arousable   Objective Vital signs in last 24 hours: Vitals:   05/10/20 0400 05/10/20 0500 05/10/20 0600 05/10/20 0700  BP:      Pulse: 88 85 91 91  Resp: 15 20 18 14   Temp:      TempSrc:      SpO2: 100% 100% 100% 100%  Weight:  73.7 kg    Height:       Weight change: -5.3 kg  Intake/Output Summary (Last 24 hours) at 05/10/2020 0851 Last data filed at 05/10/2020 0800 Gross per 24 hour  Intake 2077.86 ml  Output 2800 ml  Net -722.14 ml    Assessment/ Plan: Pt is a 59 y.o. yo female with DM, HTN, Afib, s/p renal transplant who was admitted on 05/01/2020 with worsening drainage from right foot ulcer  Assessment/Plan: 1. S/p renal transplant-  Second transplant at [facility] in September of 2020-  C/b delayed graft function.  Allograft function is not great with crt of around 3 - no acute change with hospitalization. 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  Some controversy regarding prograf dose- put on what we think -  Level high ish at 9- doseage taken down slightly and to recheck level on 8/11.  UOP fine on lasix - want to achieve diuresis-  crt staying poor but stable  2. Foot ulcer drainage-  -  Now s/p BKA 3. Anemia- fairly advanced, likely due to CKD-   giving ESA- no iron due to infection- transfuse PRN 4. HTN/volume- hypotension,  Proven to not be an issue once a line placed. All parameters in place to raise BP have been stopped.  Will continue lasix at same dose  5. FTT-  Therapies in place,  Albumin of 1.4 gives poor prognosis - on prosource -  Now cortrak placed  6. Metabolic acidosis-   oral bicarb - have inc dose-- hope will improve since has had BKA- not yet   Renal has no further suggestions- renal function stable but poor on appropriate IS therapy-  On lasix for volume overload-  Once closer to discharge would change to PO lasix 80 BID.  Call us with specific questions but will  sign off  [facility]: Basic Metabolic Panel: Recent Labs  Lab 05/08/20 0434 05/08/20 2040 05/09/20 0611 05/09/20 2056 05/10/20 0532  NA 139  --  136  --  138  K 4.9  --  4.8  --  4.9  CL 112*  --  111  --  109  CO2 15*  --  14*  --  15*  GLUCOSE 134*  --  222*  --  295*  BUN 51*  --  52*  --  56*  CREATININE 2.79*  --  2.93*  --  2.94*  CALCIUM 8.4*  --  8.1*  --  7.8*  PHOS 3.0   < > 2.9 2.6 2.7   < > = values in this interval not displayed.   Liver Function Tests: Recent Labs  Lab 05/08/20 0434 05/09/20 0611 05/10/20 0532  ALBUMIN 1.5* 1.4* 1.3*   No results for input(s): LIPASE, AMYLASE in the last 168 hours. No results for input(s): AMMONIA in the last 168 hours. CBC: Recent Labs  Lab 05/06/20 0547 05/06/20 0547 05/07/20 0559 05/07/20 1556 05/08/20 0434 05/09/20 0611 05/10/20 0532  WBC 16.8*   < > 15.9*  --  12.3* 13.4* 11.3*  NEUTROABS 13.7*   < > 15.3*  --  9.5* 11.2* 9.3*  HGB 8.1*   < > 7.9*   < > 8.7* 8.2* 7.3*  HCT 25.5*   < > 24.8*   < > 26.7* 25.3* 23.5*  MCV 99.2  --  99.6  --  92.7 93.4 95.5  PLT 264   < > 245  --  170 185 175   < > = values in this interval not displayed.   Cardiac Enzymes: No results for input(s): CKTOTAL, CKMB, CKMBINDEX, TROPONINI in the last 168 hours. CBG: Recent Labs  Lab 05/09/20 1929 05/09/20 2111 05/09/20 2312 05/10/20 0353 05/10/20 0829  GLUCAP 218* 195* 191* 223* 285*    Iron Studies: No results for input(s): IRON, TIBC, TRANSFERRIN, FERRITIN in the last 72 hours. Studies/Results: No results found. Medications: Infusions: . feeding supplement (VITAL 1.5 CAL) 1,000 mL (05/10/20 0153)  . piperacillin-tazobactam (ZOSYN)  IV 12.5 mL/hr at 05/10/20 0800    Scheduled Medications: . sodium chloride   Intravenous Once  . amiodarone  200 mg Oral Daily  . apixaban  5 mg Oral BID  . Chlorhexidine Gluconate Cloth  6 each Topical Daily  . darbepoetin (ARANESP) injection - NON-DIALYSIS  150  mcg Subcutaneous Q Mon-1800  . feeding supplement (ENSURE ENLIVE)  237 mL Oral TID BM  . feeding supplement (PROSource TF)  45 mL Per Tube BID  . folic acid  1 mg Oral Daily  . furosemide  80 mg Intravenous Q12H  . insulin aspart  0-15 Units Subcutaneous TID WC  . insulin aspart  0-5 Units Subcutaneous QHS  . lidocaine  5 mL Intradermal Once  . multivitamin with minerals  1 tablet Oral Daily  . nortriptyline  25 mg Oral QHS  . pantoprazole  40 mg Oral Daily  . predniSONE  5 mg Oral Q breakfast  . senna-docusate  1 tablet Oral Daily  . sodium bicarbonate  1,300 mg Oral BID  . tacrolimus  1 mg Oral BID  . thiamine  100 mg Oral Daily  . vitamin C  250 mg Oral BID  . zinc sulfate  220 mg Oral Daily    have reviewed scheduled and prn medications.  Physical Exam: General:  Resting but arousable- no c/o's-   Flat-  Not doing well with breakfast  Heart: RRR Lungs: mostly clear Abdomen: soft, non tender Extremities: pitting edema to dep areas -  bilat boots and bandages--- has right upper arm AVF that is patent      05/10/2020,8:51 AM  LOS: 9 days

## 2020-05-10 NOTE — Progress Notes (Signed)
Patient ID: Wanda [name], female   DOB: 25-Jun-1961, 59 y.o.   MRN: [id number]  PROGRESS NOTE    ELLINOR TEST  YYQ:[id number] DOB: September 16, 1961 DOA: 05/01/2020 PCP: Lorene Dy, MD   Brief Narrative:   59 y.o. female with medical history significant of diabetes type 2, hypertension, paroxysmal A. fib on Eliquis, hyperlipidemia, ESRD with failed kidney transplant in 2013 and repeat transplant in September 2020 at [facility] with current chronic renal disease stage IV, anemia of chronic disease, history of gastric sleeve surgery, hypoalbuminemia, right foot ulcer, chronic neuropathy presented with worsening right foot ulcer drainage getting worse over the last [redacted] weeks along with fever for 2 days.  On presentation, she was febrile with leukocytosis; x-ray of the right foot did not show any evidence of osteomyelitis.  She was started on IV fluids and antibiotics.  Assessment & Plan:   Sepsis: Present on admission Right foot ulcer infection with surrounding cellulitis,  -Patient presented with worsening right foot ulcer drainage over the last [redacted] weeks along with fever for the last couple of days.  Right foot x-ray does not show evidence of osteomyelitis. -Cultures negative so far. -MRI of the right foot without contrast did not show any evidence of osteomyelitis but showed superficial phlegmon overlying the posterior calcaneus. -Wound care evaluation appreciated.   -Recent lower extremity arterial duplex did not show any significant stenosis involving the visualized arteries of bilateral lower extremities Orthopedic evaluation/assistance appreciated.  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  Patient remains on Zosyn and they recommend to continue this.  Will defer to orthopedic for duration recommendations.  Management per orthopedic.  Hypotension: Per chart review, it sounds like patient's blood pressure was thought to be low based on the noninvasive measurement and for this reason, patient was transferred to ICU on  05/05/2020.  A line was placed and blood pressure to A-line was showing hypertension.  This indicates pseudohypotension monitor by external devices.  PCCM saw patient and signed off.  Patient's blood pressure has remained stable.  She has been switched back to oral prednisone.  Chronic kidney disease stage IV Metabolic acidosis History of ESRD status post renal transplant on chronic immunosuppressive medications -Baseline creatinine around 2.90-3.  Creatinine on presentation was 3.09.  Creatinine and bicarb has remained stable since last few days with creatinine around 2.9 and bicarb around 15.  Nephrology managing and she is on high-dose of IV Lasix now.  Moderate to severe protein calorie malnutrition/cachexia: Assessed by nutrition yesterday.  She is not eating enough.  Need supplemental.  Cortrak was placed and nutrition started on 05/08/2020.  Patient has significant pitting edema in bilateral upper and lower extremities.  She is on Lasix already.   Paroxysmal A. Fib -Held Eliquis due to planned surgical intervention/BKA.  Now s/p BKA.  Per my discussion with orthopedic PA yesterday, they cleared her for resuming Eliquis.  Eliquis has been resumed on the night of 05/08/2020.  Anemia of chronic disease/acute blood loss anemia/postoperative anemia -No signs of bleeding but hemoglobin dropped to 7.3.  Recheck at 2 PM.  Transfuse if less than 7.  Cachexia Hypoalbuminemia -Nutrition following.  She is on tube feedings.  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  Blood sugar elevated.  Has required 15 units of NovoLog based on moderate SSI scale.  Pharmacy recommends starting on 2 units scheduled NovoLog every 4 hours.  We will do that.  Continue SSI.   DVT prophylaxis: Heparin Code Status: Full Family Communication: None at bedside  Disposition Plan: Status is: Inpatient  Remains inpatient appropriate because:Inpatient level of care appropriate due to  severity of illness   Dispo: The patient is from: Home              Anticipated d/c is to: Home              Anticipated d/c date is: > 3 days              Patient currently is not medically stable to d/c.   Consultants: Nephrology/orthopedics  Procedures: None  Antimicrobials:    Subjective: Patient seen and examined.  She is much more alert and oriented today.  She has no complaints.  Objective: Vitals:   05/10/20 0500 05/10/20 0600 05/10/20 0700 05/10/20 0827  BP:      Pulse: 85 91 91   Resp: 20 18 14    Temp:    97.9 F (36.6 C)  TempSrc:    Oral  SpO2: 100% 100% 100%   Weight: 73.7 kg     Height:        Intake/Output Summary (Last 24 hours) at 05/10/2020 1100 Last data filed at 05/10/2020 0800 Gross per 24 hour  Intake 1423.09 ml  Output 2800 ml  Net -1376.91 ml   Filed Weights   05/01/20 2345 05/09/20 0500 05/10/20 0500  Weight: 70.1 kg 79 kg 73.7 kg    Examination:  General exam: Appears calm and comfortable but chronically sick Respiratory system: Clear to auscultation. Respiratory effort normal. Cardiovascular system: S1 & S2 heard, RRR. No JVD, murmurs, rubs, gallops or clicks.  +2 pitting edema bilateral lower extremity and +1 pitting edema bilateral upper extremity Gastrointestinal system: Abdomen is nondistended, soft and nontender. No organomegaly or masses felt. Normal bowel sounds heard. Central nervous system: Alert and oriented. No focal neurological deficits. Extremities: Symmetric 5 x 5 power. Skin: No rashes, lesions or ulcers.  Psychiatry: Judgement and insight appear poor. Mood & affect flat  Data Reviewed: I have personally reviewed following labs and imaging studies  CBC: Recent Labs  Lab 05/06/20 0547 05/06/20 0547 05/07/20 0559 05/07/20 0559 05/07/20 1556 05/07/20 2018 05/08/20 0434 05/09/20 0611 05/10/20 0532  WBC 16.8*  --  15.9*  --   --   --  12.3* 13.4* 11.3*  NEUTROABS 13.7*  --  15.3*  --   --   --  9.5* 11.2* 9.3*    HGB 8.1*   < > 7.9*   < > 5.4* 9.2* 8.7* 8.2* 7.3*  HCT 25.5*   < > 24.8*   < > 16.0* 28.2* 26.7* 25.3* 23.5*  MCV 99.2  --  99.6  --   --   --  92.7 93.4 95.5  PLT 264  --  245  --   --   --  170 185 175   < > = values in this interval not displayed.   Basic Metabolic Panel: Recent Labs  Lab 05/06/20 0547 05/06/20 0547 05/07/20 0559 05/07/20 0559 05/07/20 1556 05/08/20 0434 05/08/20 2040 05/09/20 0611 05/09/20 2056 05/10/20 0532  NA 136   < > 138  --  142 139  --  136  --  138  K 5.2*   < > 5.0  --  5.1 4.9  --  4.8  --  4.9  CL 114*  --  113*  --   --  112*  --  111  --  109  CO2 14*  --  15*  --   --  15*  --  14*  --  15*  GLUCOSE 181*  --  126*  --   --  134*  --  222*  --  295*  BUN 46*  --  52*  --   --  51*  --  52*  --  56*  CREATININE 2.79*  --  2.88*  --   --  2.79*  --  2.93*  --  2.94*  CALCIUM 8.5*  --  8.6*  --   --  8.4*  --  8.1*  --  7.8*  MG 1.9   < > 1.8   < >  --  1.8 2.2 2.2 2.0 2.0  PHOS  --   --   --   --   --  3.0 2.9 2.9 2.6 2.7   < > = values in this interval not displayed.   GFR: Estimated Creatinine Clearance: 21.5 mL/min (A) (by C-G formula based on SCr of 2.94 mg/dL (H)). Liver Function Tests: Recent Labs  Lab 05/08/20 0434 05/09/20 0611 05/10/20 0532  ALBUMIN 1.5* 1.4* 1.3*   No results for input(s): LIPASE, AMYLASE in the last 168 hours. No results for input(s): AMMONIA in the last 168 hours. Coagulation Profile: No results for input(s): INR, PROTIME in the last 168 hours. Cardiac Enzymes: No results for input(s): CKTOTAL, CKMB, CKMBINDEX, TROPONINI in the last 168 hours. BNP (last 3 results) No results for input(s): PROBNP in the last 8760 hours. HbA1C: No results for input(s): HGBA1C in the last 72 hours. CBG: Recent Labs  Lab 05/09/20 1929 05/09/20 2111 05/09/20 2312 05/10/20 0353 05/10/20 0829  GLUCAP 218* 195* 191* 223* 285*   Lipid Profile: No results for input(s): CHOL, HDL, LDLCALC, TRIG, CHOLHDL, LDLDIRECT in  the last 72 hours. Thyroid Function Tests: No results for input(s): TSH, T4TOTAL, FREET4, T3FREE, THYROIDAB in the last 72 hours. Anemia Panel: No results for input(s): VITAMINB12, FOLATE, FERRITIN, TIBC, IRON, RETICCTPCT in the last 72 hours. Sepsis Labs: Recent Labs  Lab 05/05/20 0840  LATICACIDVEN 0.9    Recent Results (from the past 240 hour(s))  Blood Culture (routine x 2)     Status: None   Collection Time: 05/01/20  3:03 PM   Specimen: BLOOD  Result Value Ref Range Status   Specimen Description BLOOD BLOOD RIGHT FOREARM  Final   Special Requests   Final    BOTTLES DRAWN AEROBIC AND ANAEROBIC Blood Culture results may not be optimal due to an inadequate volume of blood received in culture bottles   Culture   Final    NO GROWTH 5 DAYS Performed at [facility], [place] [address] [place], [place] [address]    Report Status 05/06/2020 FINAL  Final  Blood Culture (routine x 2)     Status: None   Collection Time: 05/01/20  3:15 PM   Specimen: BLOOD LEFT ARM  Result Value Ref Range Status   Specimen Description BLOOD LEFT ARM  Final   Special Requests   Final    BOTTLES DRAWN AEROBIC AND ANAEROBIC Blood Culture results may not be optimal due to an inadequate volume of blood received in culture bottles   Culture   Final    NO GROWTH 5 DAYS Performed at [facility], [place] [address], [place], [place] [address]    Report Status 05/06/2020 FINAL  Final  SARS Coronavirus 2 by RT PCR (hospital order, performed in [facility] hospital lab) Nasopharyngeal Nasopharyngeal Swab     Status:  None   Collection Time: 05/01/20  5:27 PM   Specimen: Nasopharyngeal Swab  Result Value Ref Range Status   SARS Coronavirus 2 NEGATIVE NEGATIVE Final    Comment: (NOTE) SARS-CoV-2 target nucleic acids are NOT DETECTED.  The SARS-CoV-2 RNA is generally detectable in upper and lower respiratory specimens during the acute phase of infection. The lowest concentration of SARS-CoV-2 viral  copies this assay can detect is 250 copies / mL. A negative result does not preclude SARS-CoV-2 infection and should not be used as the sole basis for treatment or other patient management decisions.  A negative result may occur with improper specimen collection / handling, submission of specimen other than nasopharyngeal swab, presence of viral mutation(s) within the areas targeted by this assay, and inadequate number of viral copies (<250 copies / mL). A negative result must be combined with clinical observations, patient history, and epidemiological information.  Fact Sheet for Patients:   StrictlyIdeas.no  Fact Sheet for Healthcare Providers: BankingDealers.co.za  This test is not yet approved or  cleared by the [place] FDA and has been authorized for detection and/or diagnosis of SARS-CoV-2 by FDA under an Emergency Use Authorization (EUA).  This EUA will remain in effect (meaning this test can be used) for the duration of the COVID-19 declaration under Section 564(b)(1) of the Act, 21 U.S.C. section 360bbb-3(b)(1), unless the authorization is terminated or revoked sooner.  Performed at [facility], [place] [address] [place], [place] [address]   Urine culture     Status: Abnormal   Collection Time: 05/01/20  8:11 PM   Specimen: In/Out Cath Urine  Result Value Ref Range Status   Specimen Description IN/OUT CATH URINE  Final   Special Requests   Final    NONE Performed at [facility], [place] [address], [place], [place] [address]    Culture MULTIPLE SPECIES PRESENT, SUGGEST RECOLLECTION (A)  Final   Report Status 05/03/2020 FINAL  Final  MRSA PCR Screening     Status: None   Collection Time: 05/01/20 11:38 PM   Specimen: Nasal Mucosa; Nasopharyngeal  Result Value Ref Range Status   MRSA by PCR NEGATIVE NEGATIVE Final    Comment:        The GeneXpert MRSA Assay (FDA approved for NASAL specimens only), is one  component of a comprehensive MRSA colonization surveillance program. It is not intended to diagnose MRSA infection nor to guide or monitor treatment for MRSA infections. Performed at [facility], [place] [address], [place], [place] [address]   Culture, Urine     Status: Abnormal   Collection Time: 05/05/20  8:30 AM   Specimen: Urine, Catheterized  Result Value Ref Range Status   Specimen Description URINE, CATHETERIZED  Final   Special Requests   Final    Immunocompromised Performed at [facility], [place] [address] [place],  [address]    Culture MULTIPLE SPECIES PRESENT, SUGGEST RECOLLECTION (A)  Final   Report Status 05/06/2020 FINAL  Final         Radiology Studies: No results found.      Scheduled Meds: . sodium chloride   Intravenous Once  . amiodarone  200 mg Oral Daily  . apixaban  5 mg Oral BID  . Chlorhexidine Gluconate Cloth  6 each Topical Daily  . darbepoetin (ARANESP) injection - NON-DIALYSIS  150 mcg Subcutaneous Q Mon-1800  . feeding supplement (ENSURE ENLIVE)  237 mL Oral TID BM  . feeding supplement (PROSource  TF)  45 mL Per Tube BID  . folic acid  1 mg Oral Daily  . furosemide  80 mg Intravenous Q12H  . insulin aspart  0-15 Units Subcutaneous TID WC  . insulin aspart  0-5 Units Subcutaneous QHS  . lidocaine  5 mL Intradermal Once  . multivitamin with minerals  1 tablet Oral Daily  . nortriptyline  25 mg Oral QHS  . pantoprazole  40 mg Oral Daily  . predniSONE  5 mg Oral Q breakfast  . senna-docusate  1 tablet Oral Daily  . sodium bicarbonate  1,300 mg Oral BID  . tacrolimus  1 mg Oral BID  . thiamine  100 mg Oral Daily  . vitamin C  250 mg Oral BID  . zinc sulfate  220 mg Oral Daily   Continuous Infusions: . feeding supplement (VITAL 1.5 CAL) 1,000 mL (05/10/20 0153)  . piperacillin-tazobactam (ZOSYN)  IV 12.5 mL/hr at 05/10/20 0800   Total time spent 27 minutes  Darliss Cheney, MD [facility] 05/10/2020, 11:00 AM

## 2020-05-10 NOTE — Progress Notes (Signed)
Subjective: 3 Days Post-Op Procedure(s) (LRB): AMPUTATION BELOW KNEE (Right) Patient reports pain as mild. Resting comfortably in bed.   Patient seen in rounds for Dr. Doran Durand.. Patient is well, has no complaints.   Objective: Vital signs in last 24 hours: Temp:  [97.6 F (36.4 C)-99.2 F (37.3 C)] 97.6 F (36.4 C) (08/08 0353) Pulse Rate:  [85-107] 91 (08/08 0700) Resp:  [10-20] 14 (08/08 0700) SpO2:  [100 %] 100 % (08/08 0700) Arterial Line BP: (128-177)/(47-60) 162/52 (08/08 0700) Weight:  [73.7 kg] 73.7 kg (08/08 0500)  Intake/Output from previous day:  Intake/Output Summary (Last 24 hours) at 05/10/2020 0910 Last data filed at 05/10/2020 0800 Gross per 24 hour  Intake 1825.32 ml  Output 2800 ml  Net -974.68 ml    Intake/Output this shift: Total I/O In: 67.4 [NG/GT:55; IV Piggyback:12.4] Out: 150 [Urine:150]  Labs: Recent Labs    05/07/20 1556 05/07/20 2018 05/08/20 0434 05/09/20 0611 05/10/20 0532  HGB 5.4* 9.2* 8.7* 8.2* 7.3*   Recent Labs    05/09/20 0611 05/10/20 0532  WBC 13.4* 11.3*  RBC 2.71* 2.46*  HCT 25.3* 23.5*  PLT 185 175   Recent Labs    05/09/20 0611 05/10/20 0532  NA 136 138  K 4.8 4.9  CL 111 109  CO2 14* 15*  BUN 52* 56*  CREATININE 2.93* 2.94*  GLUCOSE 222* 295*  CALCIUM 8.1* 7.8*   No results for input(s): LABPT, INR in the last 72 hours.  Exam: General - Patient is Alert and Oriented Extremity - Neurologically intact Sensation intact distally Compartment soft Dressing/Incision - stump protector in place.  Past Medical History:  Diagnosis Date  . Anemia   . Arthritis    "hands" (07/24/2013) pt denies  . Chronic kidney disease, stage IV (severe) ([facility])    "never went on dialysis" (07/24/2013)  . Diabetes mellitus type II   . Diabetic peripheral neuropathy ([place])   . Dialysis patient [facility])    Monday Wednesday Friday   . Dyspnea on exertion    pt denies  . Foot pain   . GERD (gastroesophageal reflux disease)     . Gout   . History of chicken pox   . History of measles   . History of mumps   . Hyperlipidemia   . Hypothyroidism   . Migraines    "q 3 months" (07/24/2013)  . Nonspecific abnormal unspecified cardiovascular function study   . Obesity   . PAD (peripheral artery disease) ([place])   . Peripheral neuropathy   . Peripheral vascular disease ([place])   . Pneumonia 09/2012  . Renal transplant disorder   . Shingles   . Sleep apnea    "getting ready to get tested again cause dr says I have it" (07/24/2013) pt states was tested and was told she did not have sleep apnea / no cpap  . Unspecified essential hypertension     Assessment/Plan: 3 Days Post-Op Procedure(s) (LRB): AMPUTATION BELOW KNEE (Right) Principal Problem:   Septic shock (HCC) Active Problems:   DM (diabetes mellitus), type 2 with renal complications (HCC)   Essential hypertension   CHRONIC KIDNEY DISEASE STAGE IV (SEVERE)   Renal transplant disorder   Depression   History of kidney transplant   Wound infection   Leukocytosis   Pressure injury of skin  Estimated body mass index is 23.99 kg/m as calculated from the following:   Height as of this encounter: 5\' 9"  (1.753 m).   Weight as of this encounter: 73.7  kg.  NWB right leg Continue therapy Stump protector in place at all times.  Theresa Duty, PA-C Orthopedic Surgery [phone] 05/10/2020, 9:10 AM

## 2020-05-11 LAB — BPAM RBC
Blood Product Expiration Date: 202108302359
Blood Product Expiration Date: 202108302359
Blood Product Expiration Date: 202108302359
ISSUE DATE / TIME: 202108051604
ISSUE DATE / TIME: 202108051604
ISSUE DATE / TIME: 202108082351
Unit Type and Rh: 6200
Unit Type and Rh: 6200
Unit Type and Rh: 6200

## 2020-05-11 LAB — CBC WITH DIFFERENTIAL/PLATELET
Abs Immature Granulocytes: 0.14 10*3/uL — ABNORMAL HIGH (ref 0.00–0.07)
Basophils Absolute: 0 10*3/uL (ref 0.0–0.1)
Basophils Relative: 0 %
Eosinophils Absolute: 0.1 10*3/uL (ref 0.0–0.5)
Eosinophils Relative: 1 %
HCT: 28.8 % — ABNORMAL LOW (ref 36.0–46.0)
Hemoglobin: 9.4 g/dL — ABNORMAL LOW (ref 12.0–15.0)
Immature Granulocytes: 1 %
Lymphocytes Relative: 13 %
Lymphs Abs: 1.4 10*3/uL (ref 0.7–4.0)
MCH: 30.8 pg (ref 26.0–34.0)
MCHC: 32.6 g/dL (ref 30.0–36.0)
MCV: 94.4 fL (ref 80.0–100.0)
Monocytes Absolute: 0.4 10*3/uL (ref 0.1–1.0)
Monocytes Relative: 3 %
Neutro Abs: 9.3 10*3/uL — ABNORMAL HIGH (ref 1.7–7.7)
Neutrophils Relative %: 82 %
Platelets: 179 10*3/uL (ref 150–400)
RBC: 3.05 MIL/uL — ABNORMAL LOW (ref 3.87–5.11)
RDW: 16.5 % — ABNORMAL HIGH (ref 11.5–15.5)
WBC: 11.4 10*3/uL — ABNORMAL HIGH (ref 4.0–10.5)
nRBC: 0 % (ref 0.0–0.2)

## 2020-05-11 LAB — TYPE AND SCREEN
ABO/RH(D): A POS
Antibody Screen: NEGATIVE
Unit division: 0
Unit division: 0
Unit division: 0

## 2020-05-11 LAB — GLUCOSE, CAPILLARY
Glucose-Capillary: 233 mg/dL — ABNORMAL HIGH (ref 70–99)
Glucose-Capillary: 246 mg/dL — ABNORMAL HIGH (ref 70–99)
Glucose-Capillary: 215 mg/dL — ABNORMAL HIGH (ref 70–99)
Glucose-Capillary: 227 mg/dL — ABNORMAL HIGH (ref 70–99)
Glucose-Capillary: 200 mg/dL — ABNORMAL HIGH (ref 70–99)

## 2020-05-11 LAB — RENAL FUNCTION PANEL
Albumin: 1.3 g/dL — ABNORMAL LOW (ref 3.5–5.0)
Anion gap: 11 (ref 5–15)
BUN: 57 mg/dL — ABNORMAL HIGH (ref 6–20)
CO2: 19 mmol/L — ABNORMAL LOW (ref 22–32)
Calcium: 7.9 mg/dL — ABNORMAL LOW (ref 8.9–10.3)
Chloride: 111 mmol/L (ref 98–111)
Creatinine, Ser: 2.88 mg/dL — ABNORMAL HIGH (ref 0.44–1.00)
GFR calc Af Amer: 20 mL/min — ABNORMAL LOW (ref >60)
GFR calc non Af Amer: 17 mL/min — ABNORMAL LOW (ref >60)
Glucose, Bld: 238 mg/dL — ABNORMAL HIGH (ref 70–99)
Phosphorus: 2.5 mg/dL (ref 2.5–4.6)
Potassium: 4.8 mmol/L (ref 3.5–5.1)
Sodium: 141 mmol/L (ref 135–145)

## 2020-05-11 LAB — C-REACTIVE PROTEIN: CRP: 5.9 mg/dL — ABNORMAL HIGH (ref <1.0)

## 2020-05-11 LAB — SURGICAL PATHOLOGY

## 2020-05-11 LAB — MAGNESIUM: Magnesium: 2.1 mg/dL (ref 1.7–2.4)

## 2020-05-11 MED ORDER — SODIUM CHLORIDE 0.9 % IV SOLN
INTRAVENOUS | Status: DC | PRN
  Administered 2020-05-11: 500 mL via INTRAVENOUS
  Administered 2020-05-16: 250 mL via INTRAVENOUS
  Administered 2020-05-22 – 2020-06-01 (×2): 500 mL via INTRAVENOUS

## 2020-05-11 MED ORDER — INSULIN ASPART 100 UNIT/ML ~~LOC~~ SOLN
3.0000 [IU] | SUBCUTANEOUS | Status: DC
  Administered 2020-05-11 – 2020-05-12 (×7): 3 [IU] via SUBCUTANEOUS

## 2020-05-11 NOTE — Progress Notes (Signed)
Patient transferred via bed to 4E23 with Husband at bedside

## 2020-05-11 NOTE — Progress Notes (Signed)
Pt arrived to 23 from 68M with foley cath, tube feeding and Aline. Initiated tele, assessment and CHG bath performed. D/c foley per MD. Call bell within reach.   Lavenia Atlas, RN

## 2020-05-11 NOTE — Progress Notes (Signed)
Patient ID: Wanda [name], female   DOB: [birth year], 60 y.o.   MRN: [id number]  PROGRESS NOTE    ELLISE KOVACK  SJG:[id number] DOB: 1961-04-28 DOA: 05/01/2020 PCP: Lorene Dy, MD   Brief Narrative:   59 y.o. female with medical history significant of diabetes type 2, hypertension, paroxysmal A. fib on Eliquis, hyperlipidemia, ESRD with failed kidney transplant in 2013 and repeat transplant in September 2020 at [facility] with current chronic renal disease stage IV, anemia of chronic disease, history of gastric sleeve surgery, hypoalbuminemia, right foot ulcer, chronic neuropathy presented with worsening right foot ulcer drainage getting worse over the last [redacted] weeks along with fever for 2 days.  On presentation, she was febrile with leukocytosis; x-ray of the right foot did not show any evidence of osteomyelitis.  She was started on IV fluids and antibiotics.  Assessment & Plan:   Sepsis: Present on admission Right foot ulcer infection with surrounding cellulitis,  -Patient presented with worsening right foot ulcer drainage over the last [redacted] weeks along with fever for the last couple of days.  Right foot x-ray does not show evidence of osteomyelitis. -Cultures negative so far. -MRI of the right foot without contrast did not show any evidence of osteomyelitis but showed superficial phlegmon overlying the posterior calcaneus. -Wound care evaluation appreciated.   -Recent lower extremity arterial duplex did not show any significant stenosis involving the visualized arteries of bilateral lower extremities Orthopedic evaluation/assistance appreciated.  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  Patient remains on Zosyn and they recommend to continue this.  Will defer to orthopedic for duration recommendations.  Management per orthopedic.  Hypotension: Per chart review, it sounds like patient's blood pressure was thought to be low based on the noninvasive measurement and for this reason, patient was transferred to ICU on  05/05/2020.  A line was placed and blood pressure to A-line was showing hypertension.  This indicates pseudohypotension monitor by external devices.  PCCM saw patient and signed off.  Patient's blood pressure has remained stable.  She has been switched back to oral prednisone.  Chronic kidney disease stage IV Metabolic acidosis History of ESRD status post renal transplant on chronic immunosuppressive medications -Baseline creatinine around 2.90-3.  Creatinine on presentation was 3.09.  Creatinine and bicarb has remained stable since last few days with creatinine around 2.9 and bicarb around 15.  Nephrology signed off with the last recommendations to continue IV Lasix as long as she is in the hospital and switching her to oral Lasix when closer to discharge.  Patient continues to have decent urine output.  Moderate to severe protein calorie malnutrition/cachexia: Assessed by nutrition.  She is not eating enough.  Need supplemental.  Cortrak was placed and nutrition started on 05/08/2020.  Patient has significant pitting edema in bilateral upper and lower extremities.  She is on Lasix already.  Daily encouragement to eat more but patient has poor appetite.  Paroxysmal A. Fib -Held Eliquis due to planned surgical intervention/BKA.  Now s/p BKA.  Per my discussion with orthopedic PA yesterday, they cleared her for resuming Eliquis.  Eliquis has been resumed on the night of 05/08/2020.  Anemia of chronic disease/acute blood loss anemia/postoperative anemia -No signs of bleeding but hemoglobin dropped to 7.3.  Recheck at 2 PM.  Transfuse if less than 7.  Cachexia Hypoalbuminemia -Nutrition following.  She is on tube feedings.  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  Blood sugar still elevated.  Will increase to 3 units scheduled NovoLog every  4 hours.  We will do that.  Continue SSI.   DVT prophylaxis: Heparin Code Status: Full Family Communication: None at  bedside Disposition Plan: Status is: Inpatient  Remains inpatient appropriate because:Inpatient level of care appropriate due to severity of illness   Dispo: The patient is from: Home              Anticipated d/c is to: Home              Anticipated d/c date is: > 3 days              Patient currently is not medically stable to d/c.   Consultants: Nephrology/orthopedics  Procedures: None  Antimicrobials:    Subjective:  Patient seen and examined.  She has no complaints.  Objective: Vitals:   05/11/20 0800 05/11/20 0900 05/11/20 0930 05/11/20 1000  BP:      Pulse: (!) 103 (!) 102 (!) 103 98  Resp: 15 13 (!) 21 13  Temp:      TempSrc:      SpO2: 100% 100% 100% 100%  Weight:      Height:        Intake/Output Summary (Last 24 hours) at 05/11/2020 1124 Last data filed at 05/11/2020 1000 Gross per 24 hour  Intake 2055.75 ml  Output 2950 ml  Net -894.25 ml   Filed Weights   05/09/20 0500 05/10/20 0500 05/11/20 0400  Weight: 79 kg 73.7 kg 73.9 kg    Examination:  General exam: Appears calm and comfortable  Respiratory system: Clear to auscultation. Respiratory effort normal. Cardiovascular system: S1 & S2 heard, RRR. No JVD, murmurs, rubs, gallops or clicks.  +2 pitting edema bilateral lower extremity and +1 pitting edema bilateral upper extremity Gastrointestinal system: Abdomen is nondistended, soft and nontender. No organomegaly or masses felt. Normal bowel sounds heard. Central nervous system: Alert and oriented. No focal neurological deficits. Extremities: Right BKA Skin: No rashes, lesions or ulcers.  Psychiatry: Judgement and insight appear poor. Mood & affect flat.  Data Reviewed: I have personally reviewed following labs and imaging studies  CBC: Recent Labs  Lab 05/07/20 0559 05/07/20 1556 05/08/20 0434 05/09/20 0611 05/10/20 0532 05/10/20 1820 05/11/20 0410  WBC 15.9*  --  12.3* 13.4* 11.3* 9.5 11.4*  NEUTROABS 15.3*  --  9.5* 11.2* 9.3*  --   9.3*  HGB 7.9*   < > 8.7* 8.2* 7.3* 6.8* 9.4*  HCT 24.8*   < > 26.7* 25.3* 23.5* 22.0* 28.8*  MCV 99.6  --  92.7 93.4 95.5 98.7 94.4  PLT 245  --  170 185 175 164 179   < > = values in this interval not displayed.   Basic Metabolic Panel: Recent Labs  Lab 05/07/20 0559 05/07/20 0559 05/07/20 1556 05/08/20 0434 05/08/20 0434 05/08/20 2040 05/09/20 0611 05/09/20 2056 05/10/20 0532 05/11/20 0410  NA 138   < > 142 139  --   --  136  --  138 141  K 5.0   < > 5.1 4.9  --   --  4.8  --  4.9 4.8  CL 113*  --   --  112*  --   --  111  --  109 111  CO2 15*  --   --  15*  --   --  14*  --  15* 19*  GLUCOSE 126*  --   --  134*  --   --  222*  --  295* 238*  BUN 52*  --   --  51*  --   --  52*  --  56* 57*  CREATININE 2.88*  --   --  2.79*  --   --  2.93*  --  2.94* 2.88*  CALCIUM 8.6*  --   --  8.4*  --   --  8.1*  --  7.8* 7.9*  MG 1.8   < >  --  1.8   < > 2.2 2.2 2.0 2.0 2.1  PHOS  --   --   --  3.0   < > 2.9 2.9 2.6 2.7 2.5   < > = values in this interval not displayed.   GFR: Estimated Creatinine Clearance: 22 mL/min (A) (by C-G formula based on SCr of 2.88 mg/dL (H)). Liver Function Tests: Recent Labs  Lab 05/08/20 0434 05/09/20 0611 05/10/20 0532 05/11/20 0410  ALBUMIN 1.5* 1.4* 1.3* 1.3*   No results for input(s): LIPASE, AMYLASE in the last 168 hours. No results for input(s): AMMONIA in the last 168 hours. Coagulation Profile: No results for input(s): INR, PROTIME in the last 168 hours. Cardiac Enzymes: No results for input(s): CKTOTAL, CKMB, CKMBINDEX, TROPONINI in the last 168 hours. BNP (last 3 results) No results for input(s): PROBNP in the last 8760 hours. HbA1C: No results for input(s): HGBA1C in the last 72 hours. CBG: Recent Labs  Lab 05/10/20 1145 05/10/20 1613 05/10/20 1931 05/10/20 2313 05/11/20 0305  GLUCAP 319* 298* 271* 223* 233*   Lipid Profile: No results for input(s): CHOL, HDL, LDLCALC, TRIG, CHOLHDL, LDLDIRECT in the last 72  hours. Thyroid Function Tests: No results for input(s): TSH, T4TOTAL, FREET4, T3FREE, THYROIDAB in the last 72 hours. Anemia Panel: No results for input(s): VITAMINB12, FOLATE, FERRITIN, TIBC, IRON, RETICCTPCT in the last 72 hours. Sepsis Labs: Recent Labs  Lab 05/05/20 0840  LATICACIDVEN 0.9    Recent Results (from the past 240 hour(s))  Blood Culture (routine x 2)     Status: None   Collection Time: 05/01/20  3:03 PM   Specimen: BLOOD  Result Value Ref Range Status   Specimen Description BLOOD BLOOD RIGHT FOREARM  Final   Special Requests   Final    BOTTLES DRAWN AEROBIC AND ANAEROBIC Blood Culture results may not be optimal due to an inadequate volume of blood received in culture bottles   Culture   Final    NO GROWTH 5 DAYS Performed at [facility], [place] [address], [place], [place] [address]    Report Status 05/06/2020 FINAL  Final  Blood Culture (routine x 2)     Status: None   Collection Time: 05/01/20  3:15 PM   Specimen: BLOOD LEFT ARM  Result Value Ref Range Status   Specimen Description BLOOD LEFT ARM  Final   Special Requests   Final    BOTTLES DRAWN AEROBIC AND ANAEROBIC Blood Culture results may not be optimal due to an inadequate volume of blood received in culture bottles   Culture   Final    NO GROWTH 5 DAYS Performed at [facility], [place] [address] [place], [place] [address]    Report Status 05/06/2020 FINAL  Final  SARS Coronavirus 2 by RT PCR (hospital order, performed in [facility] hospital lab) Nasopharyngeal Nasopharyngeal Swab     Status: None   Collection Time: 05/01/20  5:27 PM   Specimen: Nasopharyngeal Swab  Result Value Ref Range Status   SARS Coronavirus 2 NEGATIVE NEGATIVE Final    Comment: (NOTE) SARS-CoV-2 target nucleic acids are  NOT DETECTED.  The SARS-CoV-2 RNA is generally detectable in upper and lower respiratory specimens during the acute phase of infection. The lowest concentration of SARS-CoV-2 viral copies this  assay can detect is 250 copies / mL. A negative result does not preclude SARS-CoV-2 infection and should not be used as the sole basis for treatment or other patient management decisions.  A negative result may occur with improper specimen collection / handling, submission of specimen other than nasopharyngeal swab, presence of viral mutation(s) within the areas targeted by this assay, and inadequate number of viral copies (<250 copies / mL). A negative result must be combined with clinical observations, patient history, and epidemiological information.  Fact Sheet for Patients:   StrictlyIdeas.no  Fact Sheet for Healthcare Providers: BankingDealers.co.za  This test is not yet approved or  cleared by the [place] FDA and has been authorized for detection and/or diagnosis of SARS-CoV-2 by FDA under an Emergency Use Authorization (EUA).  This EUA will remain in effect (meaning this test can be used) for the duration of the COVID-19 declaration under Section 564(b)(1) of the Act, 21 U.S.C. section 360bbb-3(b)(1), unless the authorization is terminated or revoked sooner.  Performed at [facility], [place] [address], [place], [place] [address]   Urine culture     Status: Abnormal   Collection Time: 05/01/20  8:11 PM   Specimen: In/Out Cath Urine  Result Value Ref Range Status   Specimen Description IN/OUT CATH URINE  Final   Special Requests   Final    NONE Performed at [facility], [place] [address] [place], [place] [address]    Culture MULTIPLE SPECIES PRESENT, SUGGEST RECOLLECTION (A)  Final   Report Status 05/03/2020 FINAL  Final  MRSA PCR Screening     Status: None   Collection Time: 05/01/20 11:38 PM   Specimen: Nasal Mucosa; Nasopharyngeal  Result Value Ref Range Status   MRSA by PCR NEGATIVE NEGATIVE Final    Comment:        The GeneXpert MRSA Assay (FDA approved for NASAL specimens only), is one component of  a comprehensive MRSA colonization surveillance program. It is not intended to diagnose MRSA infection nor to guide or monitor treatment for MRSA infections. Performed at [facility], [place] [address], [place], [place] [address]   Culture, Urine     Status: Abnormal   Collection Time: 05/05/20  8:30 AM   Specimen: Urine, Catheterized  Result Value Ref Range Status   Specimen Description URINE, CATHETERIZED  Final   Special Requests   Final    Immunocompromised Performed at [facility], [place] [address], [place], [place] [address]    Culture MULTIPLE SPECIES PRESENT, SUGGEST RECOLLECTION (A)  Final   Report Status 05/06/2020 FINAL  Final     Radiology Studies: No results found.   Scheduled Meds: . sodium chloride   Intravenous Once  . sodium chloride   Intravenous Once  . amiodarone  200 mg Oral Daily  . apixaban  5 mg Oral BID  . Chlorhexidine Gluconate Cloth  6 each Topical Daily  . darbepoetin (ARANESP) injection - NON-DIALYSIS  150 mcg Subcutaneous Q Mon-1800  . feeding supplement (ENSURE ENLIVE)  237 mL Oral TID BM  . feeding supplement (PROSource TF)  45 mL Per Tube BID  . folic acid  1 mg Oral Daily  . furosemide  80 mg Intravenous Q12H  . insulin aspart  0-15 Units Subcutaneous TID WC  . insulin  aspart  0-5 Units Subcutaneous QHS  . insulin aspart  3 Units Subcutaneous Q4H  . lidocaine  5 mL Intradermal Once  . multivitamin with minerals  1 tablet Oral Daily  . nortriptyline  25 mg Oral QHS  . pantoprazole  40 mg Oral Daily  . predniSONE  5 mg Oral Q breakfast  . senna-docusate  1 tablet Oral Daily  . sodium bicarbonate  1,300 mg Oral BID  . tacrolimus  1 mg Oral BID  . thiamine  100 mg Oral Daily  . vitamin C  250 mg Oral BID  . zinc sulfate  220 mg Oral Daily   Continuous Infusions: . sodium chloride 10 mL/hr at 05/11/20 1000  . feeding supplement (VITAL 1.5 CAL) 1,000 mL (05/10/20 2230)  . piperacillin-tazobactam (ZOSYN)  IV Stopped (05/11/20  0906)   Total time spent 26 minutes  Darliss Cheney, MD [facility] 05/11/2020, 11:24 AM

## 2020-05-12 LAB — C-REACTIVE PROTEIN: CRP: 5.5 mg/dL — ABNORMAL HIGH (ref <1.0)

## 2020-05-12 LAB — CBC WITH DIFFERENTIAL/PLATELET
Abs Immature Granulocytes: 0.11 10*3/uL — ABNORMAL HIGH (ref 0.00–0.07)
Basophils Absolute: 0 10*3/uL (ref 0.0–0.1)
Basophils Relative: 0 %
Eosinophils Absolute: 0.1 10*3/uL (ref 0.0–0.5)
Eosinophils Relative: 1 %
HCT: 27.7 % — ABNORMAL LOW (ref 36.0–46.0)
Hemoglobin: 8.9 g/dL — ABNORMAL LOW (ref 12.0–15.0)
Immature Granulocytes: 1 %
Lymphocytes Relative: 16 %
Lymphs Abs: 1.5 10*3/uL (ref 0.7–4.0)
MCH: 30.1 pg (ref 26.0–34.0)
MCHC: 32.1 g/dL (ref 30.0–36.0)
MCV: 93.6 fL (ref 80.0–100.0)
Monocytes Absolute: 0.4 10*3/uL (ref 0.1–1.0)
Monocytes Relative: 4 %
Neutro Abs: 7.2 10*3/uL (ref 1.7–7.7)
Neutrophils Relative %: 78 %
Platelets: 187 10*3/uL (ref 150–400)
RBC: 2.96 MIL/uL — ABNORMAL LOW (ref 3.87–5.11)
RDW: 17.2 % — ABNORMAL HIGH (ref 11.5–15.5)
WBC: 9.3 10*3/uL (ref 4.0–10.5)
nRBC: 0 % (ref 0.0–0.2)

## 2020-05-12 LAB — RENAL FUNCTION PANEL
Albumin: 1.2 g/dL — ABNORMAL LOW (ref 3.5–5.0)
Anion gap: 13 (ref 5–15)
BUN: 58 mg/dL — ABNORMAL HIGH (ref 6–20)
CO2: 19 mmol/L — ABNORMAL LOW (ref 22–32)
Calcium: 8 mg/dL — ABNORMAL LOW (ref 8.9–10.3)
Chloride: 106 mmol/L (ref 98–111)
Creatinine, Ser: 2.84 mg/dL — ABNORMAL HIGH (ref 0.44–1.00)
GFR calc Af Amer: 20 mL/min — ABNORMAL LOW (ref >60)
GFR calc non Af Amer: 17 mL/min — ABNORMAL LOW (ref >60)
Glucose, Bld: 190 mg/dL — ABNORMAL HIGH (ref 70–99)
Phosphorus: 2.5 mg/dL (ref 2.5–4.6)
Potassium: 5 mmol/L (ref 3.5–5.1)
Sodium: 138 mmol/L (ref 135–145)

## 2020-05-12 LAB — PATHOLOGIST SMEAR REVIEW

## 2020-05-12 LAB — GLUCOSE, CAPILLARY
Glucose-Capillary: 150 mg/dL — ABNORMAL HIGH (ref 70–99)
Glucose-Capillary: 162 mg/dL — ABNORMAL HIGH (ref 70–99)
Glucose-Capillary: 165 mg/dL — ABNORMAL HIGH (ref 70–99)
Glucose-Capillary: 187 mg/dL — ABNORMAL HIGH (ref 70–99)
Glucose-Capillary: 218 mg/dL — ABNORMAL HIGH (ref 70–99)
Glucose-Capillary: 237 mg/dL — ABNORMAL HIGH (ref 70–99)

## 2020-05-12 LAB — MAGNESIUM: Magnesium: 2.1 mg/dL (ref 1.7–2.4)

## 2020-05-12 MED ORDER — INSULIN ASPART 100 UNIT/ML ~~LOC~~ SOLN
4.0000 [IU] | SUBCUTANEOUS | Status: DC
  Administered 2020-05-12 – 2020-05-24 (×42): 4 [IU] via SUBCUTANEOUS

## 2020-05-12 NOTE — Progress Notes (Signed)
Pharmacy Antibiotic Note  Wanda [name] is a 59 y.o. female admitted on 05/01/2020 with right foot ulcer drainage and fever.  Currently on D#8 of Zosyn therapy for cellulitis. WBC has normalized. Afebrile. SCr remains elevated but now slowly improving     Plan: Continue Zosyn EID 3.375gm IV Q8H. F/u ortho recommendation for LOT  Monitor renal fxn, clinical course, abx LOT  Height: 5\' 9"  (175.3 cm) Weight: 78 kg (171 lb 15.3 oz) IBW/kg (Calculated) : 66.2  Temp (24hrs), Avg:98.4 F (36.9 C), Min:97.7 F (36.5 C), Max:98.9 F (37.2 C)  Recent Labs  Lab 05/05/20 0840 05/06/20 0547 05/08/20 0434 05/08/20 0434 05/09/20 0611 05/10/20 0532 05/10/20 1820 05/11/20 0410 05/12/20 0449  WBC  --    < > 12.3*   < > 13.4* 11.3* 9.5 11.4* 9.3  CREATININE  --    < > 2.79*  --  2.93* 2.94*  --  2.88* 2.84*  LATICACIDVEN 0.9  --   --   --   --   --   --   --   --    < > = values in this interval not displayed.    Estimated Creatinine Clearance: 22.3 mL/min (A) (by C-G formula based on SCr of 2.84 mg/dL (H)).    Allergies  Allergen Reactions  . Propoxyphene N-Acetaminophen Other (See Comments)    Severe headache  . Hydrocodone-Acetaminophen Other (See Comments)     Severe headache   . Astemizole Rash and Other (See Comments)    headache   . Cephalosporins Rash    Vanc 7/30 >> 8/3 CTX 7/30 >> 8/3 Zosyn 8/3 >>  7/30 covid / MRSA PCR - negative 7/30 UCx - suggest recollection 7/30 BCx - NGTD 8/3 UCx - suggest recollection  Albertina Parr, PharmD., BCPS, BCCCP Clinical Pharmacist Clinical phone for 05/12/20 until 3:30pm: [phone] If after 3:30pm, please refer to [facility] for unit-specific pharmacist

## 2020-05-12 NOTE — Progress Notes (Signed)
Mobility Specialist - Progress Note   05/12/20 1545  Mobility  Activity  (Cancel)  Mobility performed by Mobility specialist   RN instructed not to see pt today.   Pricilla Handler Mobility Specialist Mobility Specialist Phone: [phone]

## 2020-05-12 NOTE — Progress Notes (Addendum)
Subjective: 5 Days Post-Op Procedure(s) (LRB): AMPUTATION BELOW KNEE (Right)  Patient reports pain as mild.  Resting comfortably in bed.  Denies fever, chills, N/V, CP, SOB.  Admits to flatus.  Objective:   VITALS:  Temp:  [97.7 F (36.5 C)-98.9 F (37.2 C)] 98.2 F (36.8 C) (08/10 0500) Pulse Rate:  [91-149] 95 (08/10 0500) Resp:  [13-30] 16 (08/10 0500) BP: (177)/(62) 177/62 (08/09 1739) SpO2:  [93 %-100 %] 100 % (08/10 0500) Arterial Line BP: (114-180)/(47-63) 127/47 (08/10 0500) Weight:  [73.9 kg-78 kg] 78 kg (08/10 0500)  General: WDWN patient in NAD. Psych:  Appropriate mood and affect. Neuro:  A&O x 3, Moving all extremities, sensation intact to light touch HEENT:  EOMs intact Chest:  Even non-labored respirations Skin:  Stump protector C/D/I, no rashes or lesions Extremities: warm/dry, no visible edema, erythema or echymosis.  No lymphadenopathy. Pulses: Popliteus 2+ MSK:  ROM: TKE, MMT: able to perform quad set    LABS Recent Labs    05/10/20 0532 05/10/20 0532 05/10/20 1820 05/10/20 1820 05/11/20 0410 05/12/20 0449  HGB 7.3*  --  6.8*  --  9.4* 8.9*  WBC 11.3*   < > 9.5   < > 11.4* 9.3  PLT 175   < > 164   < > 179 187   < > = values in this interval not displayed.   Recent Labs    05/11/20 0410 05/12/20 0449  NA 141 138  K 4.8 5.0  CL 111 106  CO2 19* 19*  BUN 57* 58*  CREATININE 2.88* 2.84*  GLUCOSE 238* 190*   No results for input(s): LABPT, INR in the last 72 hours.   Assessment/Plan: 5 Days Post-Op Procedure(s) (LRB): AMPUTATION BELOW KNEE (Right)  NWB R LE Up with therapy Stump protector at all times Plan for 2 week outpatient post-op visit with Dr. Doran Durand D/C instructions on chart Ortho signing off.  Mechele Claude PA-C [facility] Office:  [phone]  Addendum:  Patient does not need antibiotics for her foot as her source of infection as been eradicated.  However, if the patient is still being treated due to sepsis, or other  underlying illness, we will defer antibiotic treatment to Medicine team's discretion.

## 2020-05-12 NOTE — Progress Notes (Signed)
PT Cancellation Note  Patient Details Name: Wanda [name] MRN: [id number] DOB: [birth year]   Cancelled Treatment:    Reason Eval/Treat Not Completed: Patient declined, no reason specified patient politely declines PT due to feeling nauseous. Will attempt to return if time/schedule allow.    Windell Norfolk, DPT, PN1   Supplemental Physical Therapist [facility]    Pager [phone] Acute Rehab Office [phone]

## 2020-05-12 NOTE — Progress Notes (Signed)
Patient ID: Wanda [name], female   DOB: 05-Aug-1961, 59 y.o.   MRN: [id number]  PROGRESS NOTE    Wanda [name]  JKK:[id number] DOB: [birth year] DOA: 05/01/2020 PCP: Lorene Dy, MD   Brief Narrative:   59 y.o. female with medical history significant of diabetes type 2, hypertension, paroxysmal A. fib on Eliquis, hyperlipidemia, ESRD with failed kidney transplant in 2013 and repeat transplant in September 2020 at [facility] with current chronic renal disease stage IV, anemia of chronic disease, history of gastric sleeve surgery, hypoalbuminemia, right foot ulcer, chronic neuropathy presented with worsening right foot ulcer drainage getting worse over the last [redacted] weeks along with fever for 2 days.  On presentation, she was febrile with leukocytosis; x-ray of the right foot did not show any evidence of osteomyelitis.  She was started on IV fluids and antibiotics.  Assessment & Plan:   Sepsis: Present on admission Right foot ulcer infection with surrounding cellulitis,  -Patient presented with worsening right foot ulcer drainage over the last [redacted] weeks along with fever for the last couple of days.  Right foot x-ray does not show evidence of osteomyelitis. -Cultures negative so far. -MRI of the right foot without contrast did not show any evidence of osteomyelitis but showed superficial phlegmon overlying the posterior calcaneus. -Wound care evaluation appreciated.   -Recent lower extremity arterial duplex did not show any significant stenosis involving the visualized arteries of bilateral lower extremities Orthopedic evaluation/assistance appreciated.  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  Patient is stable but very deconditioned.  Patient has remained on Zosyn for a long time in the recommended to continue it and now per orthopedic, she does not need any antibiotics since she is s/p BKA.  I will discontinue antibiotics.  Hypotension: Per chart review, it sounds like patient's blood pressure was thought to be low  based on the noninvasive measurement and for this reason, patient was transferred to ICU on 05/05/2020.  A line was placed and blood pressure to A-line was showing hypertension.  This indicates pseudohypotension monitor by external devices.  PCCM saw patient and signed off.  Patient's blood pressure has remained stable.  She has been switched back to oral prednisone.  Chronic kidney disease stage IV Metabolic acidosis History of ESRD status post renal transplant on chronic immunosuppressive medications -Baseline creatinine around 2.90-3.  Creatinine on presentation was 3.09.  Creatinine and bicarb has remained stable since last few days with creatinine around 2.9 and bicarb around 15.  Nephrology signed off with the last recommendations to continue IV Lasix as long as she is in the hospital and switching her to oral Lasix when closer to discharge.  Patient continues to have decent urine output.  Moderate to severe protein calorie malnutrition/cachexia: Assessed by nutrition.  She is not eating enough.  Need supplemental.  Cortrak was placed and nutrition started on 05/08/2020.  Patient has significant pitting edema in bilateral upper and lower extremities.  She is on Lasix already.  Daily encouragement to eat more but patient has poor appetite.  Paroxysmal A. Fib -Held Eliquis due to planned surgical intervention/BKA.  Now s/p BKA.  Per my discussion with orthopedic PA yesterday, they cleared her for resuming Eliquis.  Eliquis has been resumed on the night of 05/08/2020.  Anemia of chronic disease/acute blood loss anemia/postoperative anemia, globin to 6.8 on 05/10/2020.  Seen 1 unit of PRBC transfusion.  Hemoglobin has remained stable around 9 since then. -  Cachexia Hypoalbuminemia -Nutrition following.  She is on tube feedings.  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  Blood sugar still elevated.  Will increase to 4 units scheduled NovoLog every 4 hours.  We will  do that.  Continue SSI.   DVT prophylaxis: Heparin Code Status: Full Family Communication: None at bedside Disposition Plan: Status is: Inpatient  Remains inpatient appropriate because:Inpatient level of care appropriate due to severity of illness   Dispo: The patient is from: Home              Anticipated d/c is to: Home              Anticipated d/c date is: > 3 days              Patient currently is medically stable to d/c however I do not think she should go home.  She is severely deconditioned and has very poor appetite to the point that she is requiring tube feedings to supplement her.   Consultants: Nephrology/orthopedics  Procedures: None  Antimicrobials:    Subjective:  Patient seen and examined.  She has no complaints.  Objective: Vitals:   05/12/20 0049 05/12/20 0500 05/12/20 0808 05/12/20 1108  BP:    (!) 144/54  Pulse: 91 95  (!) 102  Resp: 14 16  15   Temp:  98.2 F (36.8 C) 98.2 F (36.8 C) 98.7 F (37.1 C)  TempSrc:  Oral Oral Oral  SpO2: 100% 100%  100%  Weight:  78 kg    Height:        Intake/Output Summary (Last 24 hours) at 05/12/2020 1250 Last data filed at 05/12/2020 0815 Gross per 24 hour  Intake 1296.52 ml  Output 1345 ml  Net -48.48 ml   Filed Weights   05/11/20 0400 05/11/20 1739 05/12/20 0500  Weight: 73.9 kg 73.9 kg 78 kg    Examination:  General exam: Appears calm and comfortable  Respiratory system: Clear to auscultation. Respiratory effort normal. Cardiovascular system: S1 & S2 heard, RRR. No JVD, murmurs, rubs, gallops or clicks.  +1 pitting edema bilateral upper extremity and lower extremity Gastrointestinal system: Abdomen is nondistended, soft and nontender. No organomegaly or masses felt. Normal bowel sounds heard. Central nervous system: Alert and oriented. No focal neurological deficits. Extremities: Symmetric 5 x 5 power. Skin: No rashes, lesions or ulcers.  Psychiatry: Judgement and insight appear poor. Mood &  affect flat  Data Reviewed: I have personally reviewed following labs and imaging studies  CBC: Recent Labs  Lab 05/08/20 0434 05/08/20 0434 05/09/20 0611 05/10/20 0532 05/10/20 1820 05/11/20 0410 05/12/20 0449  WBC 12.3*   < > 13.4* 11.3* 9.5 11.4* 9.3  NEUTROABS 9.5*  --  11.2* 9.3*  --  9.3* 7.2  HGB 8.7*   < > 8.2* 7.3* 6.8* 9.4* 8.9*  HCT 26.7*   < > 25.3* 23.5* 22.0* 28.8* 27.7*  MCV 92.7   < > 93.4 95.5 98.7 94.4 93.6  PLT 170   < > 185 175 164 179 187   < > = values in this interval not displayed.   Basic Metabolic Panel: Recent Labs  Lab 05/08/20 0434 05/08/20 2040 05/09/20 0611 05/09/20 2056 05/10/20 0532 05/11/20 0410 05/12/20 0449  NA 139  --  136  --  138 141 138  K 4.9  --  4.8  --  4.9 4.8 5.0  CL 112*  --  111  --  109 111 106  CO2 15*  --  14*  --  15* 19* 19*  GLUCOSE 134*  --  222*  --  295* 238* 190*  BUN 51*  --  52*  --  56* 57* 58*  CREATININE 2.79*  --  2.93*  --  2.94* 2.88* 2.84*  CALCIUM 8.4*  --  8.1*  --  7.8* 7.9* 8.0*  MG 1.8   < > 2.2 2.0 2.0 2.1 2.1  PHOS 3.0   < > 2.9 2.6 2.7 2.5 2.5   < > = values in this interval not displayed.   GFR: Estimated Creatinine Clearance: 22.3 mL/min (A) (by C-G formula based on SCr of 2.84 mg/dL (H)). Liver Function Tests: Recent Labs  Lab 05/08/20 0434 05/09/20 0611 05/10/20 0532 05/11/20 0410 05/12/20 0449  ALBUMIN 1.5* 1.4* 1.3* 1.3* 1.2*   No results for input(s): LIPASE, AMYLASE in the last 168 hours. No results for input(s): AMMONIA in the last 168 hours. Coagulation Profile: No results for input(s): INR, PROTIME in the last 168 hours. Cardiac Enzymes: No results for input(s): CKTOTAL, CKMB, CKMBINDEX, TROPONINI in the last 168 hours. BNP (last 3 results) No results for input(s): PROBNP in the last 8760 hours. HbA1C: No results for input(s): HGBA1C in the last 72 hours. CBG: Recent Labs  Lab 05/11/20 2043 05/12/20 0033 05/12/20 0427 05/12/20 0800 05/12/20 1213  GLUCAP 200*  165* 162* 150* 218*   Lipid Profile: No results for input(s): CHOL, HDL, LDLCALC, TRIG, CHOLHDL, LDLDIRECT in the last 72 hours. Thyroid Function Tests: No results for input(s): TSH, T4TOTAL, FREET4, T3FREE, THYROIDAB in the last 72 hours. Anemia Panel: No results for input(s): VITAMINB12, FOLATE, FERRITIN, TIBC, IRON, RETICCTPCT in the last 72 hours. Sepsis Labs: No results for input(s): PROCALCITON, LATICACIDVEN in the last 168 hours.  Recent Results (from the past 240 hour(s))  Culture, Urine     Status: Abnormal   Collection Time: 05/05/20  8:30 AM   Specimen: Urine, Catheterized  Result Value Ref Range Status   Specimen Description URINE, CATHETERIZED  Final   Special Requests   Final    Immunocompromised Performed at [facility], [address] [place], [place] [address]    Culture MULTIPLE SPECIES PRESENT, SUGGEST RECOLLECTION (A)  Final   Report Status 05/06/2020 FINAL  Final     Radiology Studies: No results found.   Scheduled Meds: . sodium chloride   Intravenous Once  . sodium chloride   Intravenous Once  . amiodarone  200 mg Oral Daily  . apixaban  5 mg Oral BID  . Chlorhexidine Gluconate Cloth  6 each Topical Daily  . darbepoetin (ARANESP) injection - NON-DIALYSIS  150 mcg Subcutaneous Q Mon-1800  . feeding supplement (ENSURE ENLIVE)  237 mL Oral TID BM  . feeding supplement (PROSource TF)  45 mL Per Tube BID  . folic acid  1 mg Oral Daily  . furosemide  80 mg Intravenous Q12H  . insulin aspart  0-15 Units Subcutaneous TID WC  . insulin aspart  0-5 Units Subcutaneous QHS  . insulin aspart  3 Units Subcutaneous Q4H  . lidocaine  5 mL Intradermal Once  . multivitamin with minerals  1 tablet Oral Daily  . nortriptyline  25 mg Oral QHS  . pantoprazole  40 mg Oral Daily  . predniSONE  5 mg Oral Q breakfast  . senna-docusate  1 tablet Oral Daily  . sodium bicarbonate  1,300 mg Oral BID  . tacrolimus  1 mg Oral BID  . thiamine  100 mg Oral Daily  .  vitamin C  250 mg Oral BID  .  zinc sulfate  220 mg Oral Daily   Continuous Infusions: . sodium chloride Stopped (05/11/20 1427)  . feeding supplement (VITAL 1.5 CAL) 1,000 mL (05/11/20 2237)   Total time spent 27 minutes  Darliss Cheney, MD [facility] 05/12/2020, 12:50 PM

## 2020-05-12 NOTE — Care Management (Signed)
Patient is active w [facility] for RN PT OT. Verified with Jonelle Sidle [facility] liaison.  Patient is agreeable to continuing services with them at DC if needed.  TOC will continue to follow for DC needs.

## 2020-05-13 LAB — RENAL FUNCTION PANEL
Albumin: 1.2 g/dL — ABNORMAL LOW (ref 3.5–5.0)
Anion gap: 10 (ref 5–15)
BUN: 62 mg/dL — ABNORMAL HIGH (ref 6–20)
CO2: 20 mmol/L — ABNORMAL LOW (ref 22–32)
Calcium: 8.1 mg/dL — ABNORMAL LOW (ref 8.9–10.3)
Chloride: 107 mmol/L (ref 98–111)
Creatinine, Ser: 2.64 mg/dL — ABNORMAL HIGH (ref 0.44–1.00)
GFR calc Af Amer: 22 mL/min — ABNORMAL LOW (ref >60)
GFR calc non Af Amer: 19 mL/min — ABNORMAL LOW (ref >60)
Glucose, Bld: 163 mg/dL — ABNORMAL HIGH (ref 70–99)
Phosphorus: 2.6 mg/dL (ref 2.5–4.6)
Potassium: 5.4 mmol/L — ABNORMAL HIGH (ref 3.5–5.1)
Sodium: 137 mmol/L (ref 135–145)

## 2020-05-13 LAB — CBC WITH DIFFERENTIAL/PLATELET
Abs Immature Granulocytes: 0.1 10*3/uL — ABNORMAL HIGH (ref 0.00–0.07)
Basophils Absolute: 0 10*3/uL (ref 0.0–0.1)
Basophils Relative: 1 %
Eosinophils Absolute: 0.1 10*3/uL (ref 0.0–0.5)
Eosinophils Relative: 1 %
HCT: 26.8 % — ABNORMAL LOW (ref 36.0–46.0)
Hemoglobin: 8.6 g/dL — ABNORMAL LOW (ref 12.0–15.0)
Immature Granulocytes: 1 %
Lymphocytes Relative: 21 %
Lymphs Abs: 1.8 10*3/uL (ref 0.7–4.0)
MCH: 30.7 pg (ref 26.0–34.0)
MCHC: 32.1 g/dL (ref 30.0–36.0)
MCV: 95.7 fL (ref 80.0–100.0)
Monocytes Absolute: 0.3 10*3/uL (ref 0.1–1.0)
Monocytes Relative: 4 %
Neutro Abs: 6.3 10*3/uL (ref 1.7–7.7)
Neutrophils Relative %: 72 %
Platelets: 192 10*3/uL (ref 150–400)
RBC: 2.8 MIL/uL — ABNORMAL LOW (ref 3.87–5.11)
RDW: 17.8 % — ABNORMAL HIGH (ref 11.5–15.5)
WBC: 8.7 10*3/uL (ref 4.0–10.5)
nRBC: 0 % (ref 0.0–0.2)

## 2020-05-13 LAB — GLUCOSE, CAPILLARY
Glucose-Capillary: 116 mg/dL — ABNORMAL HIGH (ref 70–99)
Glucose-Capillary: 130 mg/dL — ABNORMAL HIGH (ref 70–99)
Glucose-Capillary: 139 mg/dL — ABNORMAL HIGH (ref 70–99)
Glucose-Capillary: 143 mg/dL — ABNORMAL HIGH (ref 70–99)
Glucose-Capillary: 144 mg/dL — ABNORMAL HIGH (ref 70–99)
Glucose-Capillary: 178 mg/dL — ABNORMAL HIGH (ref 70–99)
Glucose-Capillary: 185 mg/dL — ABNORMAL HIGH (ref 70–99)

## 2020-05-13 LAB — MAGNESIUM: Magnesium: 1.9 mg/dL (ref 1.7–2.4)

## 2020-05-13 LAB — C-REACTIVE PROTEIN: CRP: 6.4 mg/dL — ABNORMAL HIGH (ref <1.0)

## 2020-05-13 MED ORDER — LOPERAMIDE HCL 2 MG PO CAPS
4.0000 mg | ORAL_CAPSULE | ORAL | Status: DC | PRN
  Administered 2020-05-13 – 2020-05-14 (×2): 4 mg via ORAL
  Filled 2020-05-13 (×2): qty 2

## 2020-05-13 NOTE — Progress Notes (Signed)
Occupational Therapy Treatment Patient Details Name: Wanda [name] MRN: [id number] DOB: [birth year] Today's Date: 05/13/2020    History of present illness Wanda [name] is a 59 y.o. female with medical history significant of diabetes type 2, hypertension, paroxysmal A. fib, hyperlipidemia, ESRD with failed kidney transplant in 2013 and repeat transplant in September 2020 at [facility] with current chronic renal disease stage IV, anemia of chronic disease, history of gastric sleeve surgery, hypoalbuminemia, right foot ulcer, chronic neuropathy presented with worsening right foot ulcer drainage getting worse over the last 2 weeks.  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   OT comments  Pt presents supine in bed agreeable to working with OT. Assisted with repositioning at bed level (totalA+2) as pt reports discomfort at L hip. Pt with reduced tolerance to sitting upright, initially tolerating HOB to 36* for UB ADL and AAROM, tolerating increased grossly to 45* for eating lunch. Pt requiring setup assist for meal but was able to initiate self-feeding task without assistance. HR up to the 110s with bed level activity. Pending available family/caregiver assist pt may require post acute rehab services given current weakness and assistance needed - have updated discharge recommendations to reflect. Will continue to follow acutely.   Follow Up Recommendations  SNF;Supervision/Assistance - 24 hour (unless family can provide necessary assist)    Equipment Recommendations  None recommended by OT          Precautions / Restrictions Precautions Precautions: Fall Precaution Comments: new R BKA Required Braces or Orthoses: Other Brace Other Brace: limb protector Restrictions RLE Weight Bearing: Non weight bearing       Mobility Bed Mobility Overal bed mobility: Needs Assistance             General bed mobility comments: use of +2 totalA for repositioning off of sore hip and to boost towards [facility]  Transfers                  General transfer comment: deferred as pt unable to tolerate full upright sitting at this time     Balance                                           ADL either performed or assessed with clinical judgement   ADL Overall ADL's : Needs assistance/impaired Eating/Feeding: Set up;Sitting Eating/Feeding Details (indicate cue type and reason): to open containers, pt able to feed self lunch with proper setup             Upper Body Dressing : Maximal assistance;Bed level Upper Body Dressing Details (indicate cue type and reason): donning new gown                 Functional mobility during ADLs: Total assistance (bed level repositioning )                         Cognition Arousal/Alertness: Awake/alert Behavior During Therapy: Flat affect Overall Cognitive Status: Impaired/Different from baseline Area of Impairment: Awareness;Following commands;Problem solving                       Following Commands: Follows one step commands consistently;Follows one step commands with increased time   Awareness: Emergent Problem Solving: Slow processing;Requires verbal cues General Comments: following basic commands and making basic needs known. pt stating her son is on his way to the [place]  and reports he's told her that it is snowing there.         Exercises General Exercises - Upper Extremity Shoulder Flexion: AAROM;Both;10 reps   Shoulder Instructions       General Comments      Pertinent Vitals/ Pain       Pain Assessment: Faces Faces Pain Scale: Hurts little more Pain Location: L hip Pain Descriptors / Indicators: Discomfort;Sore Pain Intervention(s): Limited activity within patient's tolerance;Monitored during session;Repositioned  Home Living                                          Prior Functioning/Environment              Frequency  Min 2X/week        Progress Toward Goals  OT  Goals(current goals can now be found in the care plan section)  Progress towards OT goals: Progressing toward goals  Acute Rehab OT Goals Patient Stated Goal: pt did not state OT Goal Formulation: With patient Time For Goal Achievement: 05/17/20 Potential to Achieve Goals: Fair ADL Goals Pt Will Perform Eating: with set-up;with adaptive utensils;bed level  Plan Discharge plan needs to be updated    Co-evaluation                 AM-PAC OT "6 Clicks" Daily Activity     Outcome Measure   Help from another person eating meals?: A Little Help from another person taking care of personal grooming?: A Lot Help from another person toileting, which includes using toliet, bedpan, or urinal?: Total Help from another person bathing (including washing, rinsing, drying)?: A Lot Help from another person to put on and taking off regular upper body clothing?: A Lot Help from another person to put on and taking off regular lower body clothing?: Total 6 Click Score: 11    End of Session    OT Visit Diagnosis: Other abnormalities of gait and mobility (R26.89);Muscle weakness (generalized) (M62.81)   Activity Tolerance Patient tolerated treatment well   Patient Left in bed;with call bell/phone within reach   Nurse Communication Mobility status        Time: 2620-3559 OT Time Calculation (min): 31 min  Charges: OT General Charges $OT Visit: 1 Visit OT Treatments $Self Care/Home Management : 23-37 mins  Lou Cal, OT Acute Rehabilitation Services Pager [phone] Office [place] 05/13/2020, 4:56 PM

## 2020-05-13 NOTE — Progress Notes (Signed)
Physical Therapy Treatment Patient Details Name: Wanda [name] MRN: [id number] DOB: 13-Jul-1961 Today's Date: 05/13/2020    History of Present Illness Wanda [name] is a 59 y.o. female with medical history significant of diabetes type 2, hypertension, paroxysmal A. fib, hyperlipidemia, ESRD with failed kidney transplant in 2013 and repeat transplant in September 2020 at [facility] with current chronic renal disease stage IV, anemia of chronic disease, history of gastric sleeve surgery, hypoalbuminemia, right foot ulcer, chronic neuropathy presented with worsening right foot ulcer drainage getting worse over the last 2 weeks.  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    PT Comments    Patient able to participate with limited in bed therex for LE strengthening and some work on upright tolerance, though not well tolerated pulling up in to long sitting x 4, but with ABP 110's/50's and pt c/o dizziness.  Did leave her sitting with HOB up at about 35 degrees and working on tolerance for upright.  If patient has capable 24 hour assist could benefit from HHPT and aide with family assist using hoyer lift at home.  Otherwise, may need to consider post acute inpatient rehab (likely SNF) prior to home.  PT to follow acutely.   Follow Up Recommendations  Home health PT;Supervision/Assistance - 24 hour;Other (comment) ([place] aide)     Equipment Recommendations  Other (comment) (hoyer lift)    Recommendations for Other Services       Precautions / Restrictions Precautions Precautions: Fall Precaution Comments: new R BKA Required Braces or Orthoses: Other Brace Other Brace: limb protector Restrictions RLE Weight Bearing: Non weight bearing    Mobility  Bed Mobility Overal bed mobility: Needs Assistance Bed Mobility: Supine to Sit       Sit to supine: Total assist;HOB elevated   General bed mobility comments: up into long sitting pt pulling up on rail with L UE x 2, then with HHA x2, unable to tolearte upright reports  too dizzy, ABP into 110's/50's RN reports some issus with BP.  Returned to supine with HOB more elevated than initially  Transfers                 General transfer comment: planned for A-P transfer to recliner, but pt unable to tolerate  Ambulation/Gait                 Stairs             Wheelchair Mobility    Modified Rankin (Stroke Patients Only)       Balance Overall balance assessment: Needs assistance   Sitting balance-Wanda [name] Scale: Zero Sitting balance - Comments: up to long sitting in bed max A with UE support and max A                                    Cognition Arousal/Alertness: Awake/alert Behavior During Therapy: Flat affect Overall Cognitive Status: Impaired/Different from baseline                         Following Commands: Follows one step commands consistently;Follows one step commands with increased time     Problem Solving: Slow processing;Requires verbal cues        Exercises General Exercises - Lower Extremity Ankle Circles/Pumps: AROM;AAROM;Left;5 reps;Supine Short Arc Quad: AAROM;Left;5 reps;Supine Heel Slides: AAROM;Left;5 reps;Supine Hip ABduction/ADduction: AROM;Both;5 reps;Supine (adductor squeezes) Amputee Exercises Quad Sets: AROM;5 reps;Right;Supine Hip ABduction/ADduction: AAROM;Right;5 reps;Supine Straight  Leg Raises: AAROM;Right;5 reps;Supine    General Comments General comments (skin integrity, edema, etc.): RN reports pt with frequent stools just had cleaned her up, encouraged OOB to chair using lift if pt tolerates later today      Pertinent Vitals/Pain Pain Assessment: Faces Faces Pain Scale: Hurts little more Pain Location: buttocks Pain Descriptors / Indicators: Sore Pain Intervention(s): Monitored during session;Repositioned;Limited activity within patient's tolerance    Home Living                      Prior Function            PT Goals (current goals can  now be found in the care plan section) Progress towards PT goals: Not progressing toward goals - comment (unable to tolerate OOB today)    Frequency    Min 2X/week      PT Plan Current plan remains appropriate    Co-evaluation              AM-PAC PT "6 Clicks" Mobility   Outcome Measure  Help needed turning from your back to your side while in a flat bed without using bedrails?: Total Help needed moving from lying on your back to sitting on the side of a flat bed without using bedrails?: Total Help needed moving to and from a bed to a chair (including a wheelchair)?: Total Help needed standing up from a chair using your arms (e.g., wheelchair or bedside chair)?: Total Help needed to walk in hospital room?: Total Help needed climbing 3-5 steps with a railing? : Total 6 Click Score: 6    End of Session   Activity Tolerance: Patient limited by fatigue Patient left: in bed;with call bell/phone within reach   PT Visit Diagnosis: Muscle weakness (generalized) (M62.81);Other abnormalities of gait and mobility (R26.89)     Time: 0930-1000 PT Time Calculation (min) (ACUTE ONLY): 30 min  Charges:  $Therapeutic Exercise: 8-22 mins $Therapeutic Activity: 8-22 mins                     Magda Kiel, PT Acute Rehabilitation Services VHQIO:[phone] Office:[phone] 05/13/2020    Reginia Naas 05/13/2020, 10:12 AM

## 2020-05-13 NOTE — Progress Notes (Signed)
PROGRESS NOTE    Wanda [name]  YQI:[id number] DOB: 01-27-1961 DOA: 05/01/2020 PCP: Lorene Dy, MD    Chief Complaint  Patient presents with  . Skin Ulcer  . Fatigue  . Fever    Brief Narrative:   59 year old lady with prior history of diabetes, hypertension, paroxysmal atrial fibrillation on Eliquis, hyperlipidemia, end-stage renal disease s/p failed kidney transplant in 2013 and repeat renal transplant in September 2020, stage IV CKD, anemia of chronic disease, history of gastric sleeve surgery, hypoalbuminemia, right foot ulcer, chronic neuropathy presents with right foot ulcer, fevers.  X-rays of the foot did not show any evidence of osteomyelitis she was started on IV antibiotics and fluids.   Assessment & Plan:   Principal Problem:   Septic shock ([place]) Active Problems:   DM (diabetes mellitus), type 2 with renal complications (HCC)   Essential hypertension   CHRONIC KIDNEY DISEASE STAGE IV ([facility])   Renal transplant disorder   Depression   History of kidney transplant   Wound infection   Leukocytosis   Pressure injury of skin   Sepsis present on admission Right foot ulcer infected with surrounding cellulitis. X-ray does not show any evidence of osteomyelitis.  X-rays do not show any signs of osteomyelitis. Cultures have been negative so far. MRI of the right foot without contrast did not show any evidence of osteomyelitis but showed superficial phlegmon overlying the posterior calcaneus.  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  Patient appears very deconditioned and has been remained on Zosyn to complete the course.   Hypotension Appears to have resolved.   Stage IV CKD, metabolic acidosis the setting of end-stage renal disease. S/p renal transplant on chronic immunosuppressive medications. Creatinine bicarb has remained stable since the last 2 days.  Nephrology was initially consulted and they have signed off at this time and recommended to  continue with oral Lasix on discharge.    Moderate to severe protein calorie malnutrition/cachexia. Full intake hence core track was placed and tube feeds were started.  Patient has significant pitting edema in the upper extremities.  She is on Lasix.    Paroxysmal atrial fibrillation On Eliquis for anticoagulation.  Rate controlled. .   Anemia of chronic disease/GI blood loss Postoperative anemia S/p 1 unit of PRBC transfusion. Transfuse to keep hemoglobin greater than 7.   Hypoalbuminemia/cachexia Nutrition consulted and on tube feeds.   Type 2 diabetes mellitus with nephropathy and neuropathy.  Continue with Lyrica. Continue with sliding scale insulin. CBG (last 3)  Recent Labs    05/13/20 0741 05/13/20 1050 05/13/20 1620  GLUCAP 116* 139* 143*       Pressure injury  Pressure Injury 05/01/20 Heel Left Unstageable - Full thickness tissue loss in which the base of the injury is covered by slough (yellow, tan, gray, green or brown) and/or eschar (tan, brown or black) in the wound bed. dark discoloration unstageable bugg (Active)  05/01/20 2330  Location: Heel  Location Orientation: Left  Staging: Unstageable - Full thickness tissue loss in which the base of the injury is covered by slough (yellow, tan, gray, green or brown) and/or eschar (tan, brown or black) in the wound bed.  Wound Description (Comments): dark discoloration unstageable buggy  Present on Admission: Yes         DVT prophylaxis: Eliquis.  Code Status: full code.  Family Communication: none at bedside.  Disposition:   Status is: Inpatient  Remains inpatient appropriate because:Unsafe d/c plan   Dispo: The patient is from: Home  Anticipated d/c is to: SNF              Anticipated d/c date is: 2 days              Patient currently is not medically stable to d/c.       Consultants:   Palliative.    Procedures: none.   Antimicrobials: none.   Subjective: No new  complaints.   Objective: Vitals:   05/13/20 0740 05/13/20 1005 05/13/20 1052 05/13/20 1515  BP: 95/82 (!) 137/56 (!) 117/54 (!) 137/50  Pulse: (!) 106  (!) 117 (!) 109  Resp: 18  18 19   Temp: 99.3 F (37.4 C)  97.7 F (36.5 C) 98.8 F (37.1 C)  TempSrc: Oral  Oral Oral  SpO2: 100%  100% 97%  Weight:      Height:        Intake/Output Summary (Last 24 hours) at 05/13/2020 1748 Last data filed at 05/13/2020 1510 Gross per 24 hour  Intake 6454.33 ml  Output 500 ml  Net 5954.33 ml   Filed Weights   05/11/20 1739 05/12/20 0500 05/13/20 0432  Weight: 73.9 kg 78 kg 71.6 kg    Examination:  General exam: Appears calm and comfortable  Respiratory system: Clear to auscultation. Respiratory effort normal. Cardiovascular system: S1 & S2 heard, RRR. No JVD,  Gastrointestinal system: Abdomen is nondistended, soft and nontender. Normal bowel sounds heard. Central nervous system: Alert and oriented. No focal neurological deficits. Extremities: pitting upper extremity edema.  Skin: heel ulcer.  Psychiatry:  Mood & affect appropriate.     Data Reviewed: I have personally reviewed following labs and imaging studies  CBC: Recent Labs  Lab 05/09/20 0611 05/09/20 0611 05/10/20 0532 05/10/20 1820 05/11/20 0410 05/12/20 0449 05/13/20 0436  WBC 13.4*   < > 11.3* 9.5 11.4* 9.3 8.7  NEUTROABS 11.2*  --  9.3*  --  9.3* 7.2 6.3  HGB 8.2*   < > 7.3* 6.8* 9.4* 8.9* 8.6*  HCT 25.3*   < > 23.5* 22.0* 28.8* 27.7* 26.8*  MCV 93.4   < > 95.5 98.7 94.4 93.6 95.7  PLT 185   < > 175 164 179 187 192   < > = values in this interval not displayed.    Basic Metabolic Panel: Recent Labs  Lab 05/09/20 0611 05/09/20 9449 05/09/20 2056 05/10/20 0532 05/11/20 0410 05/12/20 0449 05/13/20 0436  NA 136  --   --  138 141 138 137  K 4.8  --   --  4.9 4.8 5.0 5.4*  CL 111  --   --  109 111 106 107  CO2 14*  --   --  15* 19* 19* 20*  GLUCOSE 222*  --   --  295* 238* 190* 163*  BUN 52*  --   --   56* 57* 58* 62*  CREATININE 2.93*  --   --  2.94* 2.88* 2.84* 2.64*  CALCIUM 8.1*  --   --  7.8* 7.9* 8.0* 8.1*  MG 2.2   < > 2.0 2.0 2.1 2.1 1.9  PHOS 2.9   < > 2.6 2.7 2.5 2.5 2.6   < > = values in this interval not displayed.    GFR: Estimated Creatinine Clearance: 24 mL/min (A) (by C-G formula based on SCr of 2.64 mg/dL (H)).  Liver Function Tests: Recent Labs  Lab 05/09/20 0611 05/10/20 0532 05/11/20 0410 05/12/20 0449 05/13/20 0436  ALBUMIN 1.4* 1.3* 1.3* 1.2* 1.2*  CBG: Recent Labs  Lab 05/13/20 0015 05/13/20 0429 05/13/20 0741 05/13/20 1050 05/13/20 1620  GLUCAP 144* 130* 116* 139* 143*     Recent Results (from the past 240 hour(s))  Culture, Urine     Status: Abnormal   Collection Time: 05/05/20  8:30 AM   Specimen: Urine, Catheterized  Result Value Ref Range Status   Specimen Description URINE, CATHETERIZED  Final   Special Requests   Final    Immunocompromised Performed at [facility], [place] [address], [place], [place] [address]    Culture MULTIPLE SPECIES PRESENT, SUGGEST RECOLLECTION (A)  Final   Report Status 05/06/2020 FINAL  Final         Radiology Studies: No results found.      Scheduled Meds: . sodium chloride   Intravenous Once  . sodium chloride   Intravenous Once  . amiodarone  200 mg Oral Daily  . apixaban  5 mg Oral BID  . Chlorhexidine Gluconate Cloth  6 each Topical Daily  . darbepoetin (ARANESP) injection - NON-DIALYSIS  150 mcg Subcutaneous Q Mon-1800  . feeding supplement (ENSURE ENLIVE)  237 mL Oral TID BM  . feeding supplement (PROSource TF)  45 mL Per Tube BID  . folic acid  1 mg Oral Daily  . furosemide  80 mg Intravenous Q12H  . insulin aspart  0-15 Units Subcutaneous TID WC  . insulin aspart  0-5 Units Subcutaneous QHS  . insulin aspart  4 Units Subcutaneous Q4H  . lidocaine  5 mL Intradermal Once  . multivitamin with minerals  1 tablet Oral Daily  . nortriptyline  25 mg Oral QHS  . pantoprazole  40  mg Oral Daily  . predniSONE  5 mg Oral Q breakfast  . senna-docusate  1 tablet Oral Daily  . sodium bicarbonate  1,300 mg Oral BID  . tacrolimus  1 mg Oral BID  . thiamine  100 mg Oral Daily  . vitamin C  250 mg Oral BID  . zinc sulfate  220 mg Oral Daily   Continuous Infusions: . sodium chloride Stopped (05/11/20 1427)  . feeding supplement (VITAL 1.5 CAL) 1,000 mL (05/13/20 1510)     LOS: 12 days        Hosie Poisson, MD [facility]   To contact the attending provider between 7A-7P or the covering provider during after hours 7P-7A, please log into the web site [email or web address] and access using universal [place] password for that web site. If you do not have the password, please call the hospital operator.  05/13/2020, 5:48 PM

## 2020-05-13 NOTE — Progress Notes (Signed)
[place], MD notified of patients 5.4 potassium level.  Patient has no obvious signs of distress. NAD. Will continue to monitor.

## 2020-05-13 NOTE — Consult Note (Signed)
[place] Nurse Consult Note: Patient receiving care in [place].  Assisted with turning for assessment by NT. Reason for Consult: sacral and buttocks wounds Wound type: MASD-Incontinence associated dermatitis from fecal and urinary incontinence Pressure Injury POA: Yes/No/NA Measurement: Wound bed: red Drainage (amount, consistency, odor)  Periwound: peeling Dressing procedure/placement/frequency: foam dressing are in use, continue these. Change every 3 days and prn.

## 2020-05-13 NOTE — Progress Notes (Signed)
Mobility Specialist - Progress Note   05/13/20 1533  Mobility  Activity  (Upperbody exercises)  Mobility Response Tolerated well  Mobility performed by Mobility specialist   Assisted pt w/ upper-body exercises in order to build up pt's strength, PT advised that this pt could benefit from this.   Pricilla Handler Mobility Specialist Mobility Specialist Phone: [phone]

## 2020-05-14 LAB — CBC WITH DIFFERENTIAL/PLATELET
Abs Immature Granulocytes: 0.15 10*3/uL — ABNORMAL HIGH (ref 0.00–0.07)
Basophils Absolute: 0 10*3/uL (ref 0.0–0.1)
Basophils Relative: 0 %
Eosinophils Absolute: 0.1 10*3/uL (ref 0.0–0.5)
Eosinophils Relative: 1 %
HCT: 28.2 % — ABNORMAL LOW (ref 36.0–46.0)
Hemoglobin: 8.9 g/dL — ABNORMAL LOW (ref 12.0–15.0)
Immature Granulocytes: 2 %
Lymphocytes Relative: 16 %
Lymphs Abs: 1.5 10*3/uL (ref 0.7–4.0)
MCH: 29.9 pg (ref 26.0–34.0)
MCHC: 31.6 g/dL (ref 30.0–36.0)
MCV: 94.6 fL (ref 80.0–100.0)
Monocytes Absolute: 0.3 10*3/uL (ref 0.1–1.0)
Monocytes Relative: 3 %
Neutro Abs: 7.7 10*3/uL (ref 1.7–7.7)
Neutrophils Relative %: 78 %
Platelets: 228 10*3/uL (ref 150–400)
RBC: 2.98 MIL/uL — ABNORMAL LOW (ref 3.87–5.11)
RDW: 18 % — ABNORMAL HIGH (ref 11.5–15.5)
WBC: 9.8 10*3/uL (ref 4.0–10.5)
nRBC: 0 % (ref 0.0–0.2)

## 2020-05-14 LAB — MAGNESIUM: Magnesium: 2.1 mg/dL (ref 1.7–2.4)

## 2020-05-14 LAB — RENAL FUNCTION PANEL
Albumin: 1.3 g/dL — ABNORMAL LOW (ref 3.5–5.0)
Anion gap: 11 (ref 5–15)
BUN: 69 mg/dL — ABNORMAL HIGH (ref 6–20)
CO2: 21 mmol/L — ABNORMAL LOW (ref 22–32)
Calcium: 8.2 mg/dL — ABNORMAL LOW (ref 8.9–10.3)
Chloride: 105 mmol/L (ref 98–111)
Creatinine, Ser: 2.7 mg/dL — ABNORMAL HIGH (ref 0.44–1.00)
GFR calc Af Amer: 21 mL/min — ABNORMAL LOW (ref >60)
GFR calc non Af Amer: 19 mL/min — ABNORMAL LOW (ref >60)
Glucose, Bld: 202 mg/dL — ABNORMAL HIGH (ref 70–99)
Phosphorus: 2.8 mg/dL (ref 2.5–4.6)
Potassium: 5.7 mmol/L — ABNORMAL HIGH (ref 3.5–5.1)
Sodium: 137 mmol/L (ref 135–145)

## 2020-05-14 LAB — GLUCOSE, CAPILLARY
Glucose-Capillary: 126 mg/dL — ABNORMAL HIGH (ref 70–99)
Glucose-Capillary: 135 mg/dL — ABNORMAL HIGH (ref 70–99)
Glucose-Capillary: 172 mg/dL — ABNORMAL HIGH (ref 70–99)
Glucose-Capillary: 175 mg/dL — ABNORMAL HIGH (ref 70–99)
Glucose-Capillary: 179 mg/dL — ABNORMAL HIGH (ref 70–99)
Glucose-Capillary: 182 mg/dL — ABNORMAL HIGH (ref 70–99)
Glucose-Capillary: 199 mg/dL — ABNORMAL HIGH (ref 70–99)

## 2020-05-14 LAB — C-REACTIVE PROTEIN: CRP: 7.3 mg/dL — ABNORMAL HIGH (ref <1.0)

## 2020-05-14 MED ORDER — PROSOURCE TF PO LIQD
90.0000 mL | Freq: Two times a day (BID) | ORAL | Status: DC
  Administered 2020-05-14 – 2020-05-27 (×23): 90 mL
  Filled 2020-05-14 (×31): qty 90

## 2020-05-14 MED ORDER — OSMOLITE 1.5 CAL PO LIQD
1120.0000 mL | ORAL | Status: DC
  Administered 2020-05-14: 1120 mL
  Filled 2020-05-14 (×3): qty 2000

## 2020-05-14 MED ORDER — SODIUM ZIRCONIUM CYCLOSILICATE 10 G PO PACK
10.0000 g | PACK | Freq: Two times a day (BID) | ORAL | Status: DC
  Administered 2020-05-14 – 2020-05-23 (×17): 10 g via ORAL
  Filled 2020-05-14 (×20): qty 1

## 2020-05-14 NOTE — Progress Notes (Signed)
Physical Therapy Treatment Patient Details Name: Wanda [name] MRN: [id number] DOB: 1961/07/15 Today's Date: 05/14/2020    History of Present Illness Wanda [name] is a 59 y.o. female with medical history significant of diabetes type 2, hypertension, paroxysmal A. fib, hyperlipidemia, ESRD with failed kidney transplant in 2013 and repeat transplant in September 2020 at [facility] with current chronic renal disease stage IV, anemia of chronic disease, history of gastric sleeve surgery, hypoalbuminemia, right foot ulcer, chronic neuropathy presented with worsening right foot ulcer drainage getting worse over the last 2 weeks.  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    PT Comments    Pt was seen for bed ex after pt is unable to get OOB to chair today.  Pt is updated to SNF recommendation for home as she is not able to demonstrate enough progression to justify home now.  Follow acutely to make progress as she is able, recommend being up in chair as soon as pt can do this.  Strength and balance needs are priority to succeed for rehab stay.   Follow Up Recommendations  SNF;Supervision for mobility/OOB     Equipment Recommendations  None recommended by PT    Recommendations for Other Services       Precautions / Restrictions Precautions Precautions: Fall Precaution Comments: new R BKA Required Braces or Orthoses: Other Brace Other Brace: limb protector Restrictions Weight Bearing Restrictions: Yes RLE Weight Bearing: Non weight bearing LLE Weight Bearing: Weight bearing as tolerated    Mobility  Bed Mobility Overal bed mobility: Needs Assistance             General bed mobility comments: 1-2 max assist to scoot up in bed  Transfers                 General transfer comment: deferred as pt unable to tolerate full upright sitting at this time   Ambulation/Gait                 Stairs             Wheelchair Mobility    Modified Rankin (Stroke Patients Only)        Balance                                            Cognition Arousal/Alertness: Lethargic Behavior During Therapy: Flat affect Overall Cognitive Status: Impaired/Different from baseline Area of Impairment: Problem solving;Awareness;Safety/judgement;Memory;Following commands                     Memory: Decreased short-term memory Following Commands: Follows one step commands inconsistently;Follows one step commands with increased time Safety/Judgement: Decreased awareness of safety;Decreased awareness of deficits Awareness: Intellectual Problem Solving: Slow processing;Requires verbal cues        Exercises General Exercises - Lower Extremity Quad Sets: AROM;10 reps Gluteal Sets: AROM;10 reps Heel Slides: AAROM;10 reps Hip ABduction/ADduction: AAROM;10 reps Straight Leg Raises: AAROM;10 reps Hip Flexion/Marching: AAROM;10 reps    General Comments        Pertinent Vitals/Pain Pain Assessment: Faces Faces Pain Scale: Hurts little more Pain Location: L hip    Home Living                      Prior Function            PT Goals (current goals can now be found in the  care plan section) Acute Rehab PT Goals Patient Stated Goal: pt did not state    Frequency    Min 2X/week      PT Plan Discharge plan needs to be updated    Co-evaluation              AM-PAC PT "6 Clicks" Mobility   Outcome Measure  Help needed turning from your back to your side while in a flat bed without using bedrails?: A Lot Help needed moving from lying on your back to sitting on the side of a flat bed without using bedrails?: A Lot Help needed moving to and from a bed to a chair (including a wheelchair)?: Total Help needed standing up from a chair using your arms (e.g., wheelchair or bedside chair)?: Total Help needed to walk in hospital room?: Total Help needed climbing 3-5 steps with a railing? : Total 6 Click Score: 8    End of Session    Activity Tolerance: Patient limited by fatigue Patient left: in bed;with call bell/phone within reach Nurse Communication: Mobility status;Need for lift equipment PT Visit Diagnosis: Muscle weakness (generalized) (M62.81);Other abnormalities of gait and mobility (R26.89)     Time: 4081-4481 PT Time Calculation (min) (ACUTE ONLY): 18 min  Charges:  $Therapeutic Exercise: 8-22 mins                 Ramond Dial 05/14/2020, 10:38 PM  Mee Hives, PT MS Acute Rehab Dept. Number: [place]

## 2020-05-14 NOTE — Progress Notes (Signed)
Nutrition Follow-up  DOCUMENTATION CODES:   Severe malnutrition in context of chronic illness  INTERVENTION:   Continue EN via Cortrak tube until pt demonstrates consistent PO intake  Transition to nocturnal feedings:  -Osmolite 1.5 @ 80 ml x 14 hrs via Cortrak (6pm to 8am) -90 ml ProSource BID  Provides: 1840 kcals, 114 grams protein, 853 ml free water. Meets 88% kcal and 100% of protein needs.   Ensure Enlive po BID, each supplement provides 350 kcal and 20 grams of protein  NUTRITION DIAGNOSIS:   Severe Malnutrition related to chronic illness as evidenced by moderate fat depletion, severe muscle depletion, percent weight loss.  Ongoing  GOAL:   Patient will meet greater than or equal to 90% of their needs  Addressed via TF  MONITOR:   PO intake, Supplement acceptance, Labs, Weight trends, Skin  REASON FOR ASSESSMENT:   Consult Assessment of nutrition requirement/status  ASSESSMENT:   59 year old female who presented on 7/30 with acute worsening of right foot wound. PMH of anemia, ESRD s/p failed kidney transplant in 2013 and repeat transplatnt in September 2020 with current CKD stage IV, T2DM, HLD, PAD, bariatric surgery (gastric sleeve). Pt admitted with sepsis.   8/5- s/p R BKA  Pt tolerating Vital 1.5 at goal rate. Denies nausea/abdominal pain. Appetite slow to progress. Meal completions charted as 25-50% with multiple skipped meals. Per pt, husband is bring meals from home, she is finishing ~25% of each. Transition to nocturnal feeding in hopes to promote appetite during the day. Recommend continuing enteral nutrition via Cortrak until PO intake consistently meets 75%.   Admission weight: 70.1 kg  Current weight: 71.6 kg  Medications: folic acid, 80 mg lasix BID, SS novolog, prednisone, senokot, thiamine, 250 mg Vit C daily, 220 mg zinc sulfate  Labs: K 5.7 (H) CBG 143-202  Diet Order: DYS 3, thin liquids   EDUCATION NEEDS:   Not appropriate for  education at this time  Skin:  Skin Assessment: Skin Integrity Issues: Skin Integrity Issues:: Other (Comment), Unstageable Unstageable: left heel Incision: R leg Other: MASD- groin, buttocks, thigh  Last BM:  8/12  Height:   Ht Readings from Last 1 Encounters:  05/11/20 5\' 9"  (1.753 m)    Weight:   Wt Readings from Last 1 Encounters:  05/14/20 49 kg    Ideal Body Weight:  65.9 kg  BMI:  Body mass index is 15.95 kg/m.  Estimated Nutritional Needs:   Kcal:  2100-2400 kcals  Protein:  110-130 grams  Fluid:  >/= 2.0 L   Mariana Single RD, LDN Clinical Nutrition Pager listed in [place]

## 2020-05-14 NOTE — Progress Notes (Signed)
Mobility Specialist - Progress Note   05/14/20 1418  Mobility  Activity Refused mobility   Pt refusing mobility, stating her BP was low w/ PT.  Pricilla Handler Mobility Specialist Mobility Specialist Phone: [phone]

## 2020-05-14 NOTE — Progress Notes (Signed)
PROGRESS NOTE    Wanda [name]  NOB:[id number] DOB: 09/29/1961 DOA: 05/01/2020 PCP: Lorene Dy, MD    Chief Complaint  Patient presents with  . Skin Ulcer  . Fatigue  . Fever    Brief Narrative:   59 year old lady with prior history of diabetes, hypertension, paroxysmal atrial fibrillation on Eliquis, hyperlipidemia, end-stage renal disease s/p failed kidney transplant in 2013 and repeat renal transplant in September 2020, stage IV CKD, anemia of chronic disease, history of gastric sleeve surgery, hypoalbuminemia, right foot ulcer, chronic neuropathy presents with right foot ulcer, fevers.  X-rays of the foot did not show any evidence of osteomyelitis she was started on IV antibiotics and fluids. Pt continues to remain on tube feeds. As per RN, pt is eating less than 25% orally. She appears very deconditioned. Palliative care consulted for goals of care.    Assessment & Plan:   Principal Problem:   Septic shock ([place]) Active Problems:   DM (diabetes mellitus), type 2 with renal complications (HCC)   Essential hypertension   CHRONIC KIDNEY DISEASE STAGE IV ([facility])   Renal transplant disorder   Depression   History of kidney transplant   Wound infection   Leukocytosis   Pressure injury of skin   Sepsis present on admission Right foot ulcer infected with surrounding cellulitis. Pt completed the course of zosyn.  X-ray does not show any evidence of osteomyelitis.  Cultures have been negative so far. MRI of the right foot without contrast did not show any evidence of osteomyelitis but showed superficial phlegmon overlying the posterior calcaneus. Wound care consulted for recommendations.  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  Patient appears very deconditioned.     Hypotension Appears to have resolved. BP parameters are optimal.    Stage IV CKD, metabolic acidosis the setting of end-stage renal disease. S/p renal transplant on chronic immunosuppressive  medications. Creatinine bicarb has remained stable since the last 2 days.  Nephrology was initially consulted and they have signed off at this time and recommended to continue with oral Lasix on discharge. Plan to transition to oral lasix 80 mg BID from tomorrow.  Bicarb improving on sodium bicarb tabs.    Hyperkalemia:  EKG ordered, and lokelma added.  Repeat BMP in am.    severe protein calorie malnutrition/cachexia. Poor oral intake,  cor track was placed and tube feeds were started.   Patient has significant pitting edema in the upper extremities.   She is on Lasix, continue the same.     Paroxysmal atrial fibrillation On Eliquis for anticoagulation.  Rate controlled with amiodarone.  Echocardiogram showed Left ventricular ejection fraction, by estimation, is 70 to 75%. The left ventricle has hyperdynamic function. The left ventricle has no regional wall motion abnormalities. There is moderate concentric left  ventricular hypertrophy. Indeterminate  diastolic filling due to E-A fusion.  H er CHAD2 VASC2 score is 3 to 4.    Anemia of chronic disease/GI blood loss Postoperative anemia S/p 1 unit of PRBC transfusion. Transfuse to keep hemoglobin greater than 7. Hemoglobin stable around 8.    Hypoalbuminemia/cachexia Nutrition consulted and on tube feeds.   Type 2 diabetes mellitus with nephropathy and neuropathy.  Continue with Lyrica. Continue with sliding scale insulin. CBG (last 3)  Recent Labs    05/14/20 0812 05/14/20 1102 05/14/20 1608  GLUCAP 199* 135* 126*   No changes in medications.     Pressure injury  Pressure Injury 05/01/20 Heel Left Unstageable - Full thickness tissue loss in  which the base of the injury is covered by slough (yellow, tan, gray, green or brown) and/or eschar (tan, brown or black) in the wound bed. dark discoloration unstageable bugg (Active)  05/01/20 2330  Location: Heel  Location Orientation: Left  Staging: Unstageable - Full  thickness tissue loss in which the base of the injury is covered by slough (yellow, tan, gray, green or brown) and/or eschar (tan, brown or black) in the wound bed.  Wound Description (Comments): dark discoloration unstageable buggy  Present on Admission: Yes   Wound care consulted and recommendations given.       DVT prophylaxis: Eliquis.  Code Status: full code.  Family Communication: none at bedside.  Disposition:   Status is: Inpatient  Remains inpatient appropriate because:Unsafe d/c plan   Dispo: The patient is from: Home              Anticipated d/c is to: SNF              Anticipated d/c date is: 2 days              Patient currently is not medically stable to d/c.       Consultants:   Palliative.    Procedures: none.   Antimicrobials: none.   Subjective: No new complaints.   Objective: Vitals:   05/14/20 0750 05/14/20 1103 05/14/20 1105 05/14/20 1548  BP: (!) 146/57 (!) 135/58 (!) 134/55 (!) 144/49  Pulse: (!) 108 (!) 115  (!) 105  Resp: 18 19 17 17   Temp: 98.6 F (37 C) 99 F (37.2 C)  99.7 F (37.6 C)  TempSrc: Oral Oral  Oral  SpO2: 93% 92%  97%  Weight:      Height:        Intake/Output Summary (Last 24 hours) at 05/14/2020 1719 Last data filed at 05/14/2020 1712 Gross per 24 hour  Intake 1695 ml  Output 2550 ml  Net -855 ml   Filed Weights   05/12/20 0500 05/13/20 0432 05/14/20 0518  Weight: 78 kg 71.6 kg 49 kg    Examination:  General exam: Calm andcomfortable, not in distress. Appears cachetic.  Respiratory system: air entry fair, no wheezing or rhonchi Cardiovascular system: S1S2 heard, RRR, no JVD.  Gastrointestinal system: Abdomen is soft, non tender non distended bowel sounds wnl.  Central nervous system: alert and oriented.  Extremities: no cyanosis.  Skin: heel ulcer.  Psychiatry:  Mood is appropriate.      Data Reviewed: I have personally reviewed following labs and imaging studies  CBC: Recent Labs  Lab  05/10/20 0532 05/10/20 0532 05/10/20 1820 05/11/20 0410 05/12/20 0449 05/13/20 0436 05/14/20 0421  WBC 11.3*   < > 9.5 11.4* 9.3 8.7 9.8  NEUTROABS 9.3*  --   --  9.3* 7.2 6.3 7.7  HGB 7.3*   < > 6.8* 9.4* 8.9* 8.6* 8.9*  HCT 23.5*   < > 22.0* 28.8* 27.7* 26.8* 28.2*  MCV 95.5   < > 98.7 94.4 93.6 95.7 94.6  PLT 175   < > 164 179 187 192 228   < > = values in this interval not displayed.    Basic Metabolic Panel: Recent Labs  Lab 05/10/20 0532 05/11/20 0410 05/12/20 0449 05/13/20 0436 05/14/20 0421  NA 138 141 138 137 137  K 4.9 4.8 5.0 5.4* 5.7*  CL 109 111 106 107 105  CO2 15* 19* 19* 20* 21*  GLUCOSE 295* 238* 190* 163* 202*  BUN 56* 57* 58*  62* 69*  CREATININE 2.94* 2.88* 2.84* 2.64* 2.70*  CALCIUM 7.8* 7.9* 8.0* 8.1* 8.2*  MG 2.0 2.1 2.1 1.9 2.1  PHOS 2.7 2.5 2.5 2.6 2.8    GFR: Estimated Creatinine Clearance: 17.4 mL/min (A) (by C-G formula based on SCr of 2.7 mg/dL (H)).  Liver Function Tests: Recent Labs  Lab 05/10/20 0532 05/11/20 0410 05/12/20 0449 05/13/20 0436 05/14/20 0421  ALBUMIN 1.3* 1.3* 1.2* 1.2* 1.3*    CBG: Recent Labs  Lab 05/13/20 2359 05/14/20 0426 05/14/20 0812 05/14/20 1102 05/14/20 1608  GLUCAP 175* 182* 199* 135* 126*     Recent Results (from the past 240 hour(s))  Culture, Urine     Status: Abnormal   Collection Time: 05/05/20  8:30 AM   Specimen: Urine, Catheterized  Result Value Ref Range Status   Specimen Description URINE, CATHETERIZED  Final   Special Requests   Final    Immunocompromised Performed at [facility], [place] [address], [place], [place] [address]    Culture MULTIPLE SPECIES PRESENT, SUGGEST RECOLLECTION (A)  Final   Report Status 05/06/2020 FINAL  Final         Radiology Studies: No results found.      Scheduled Meds: . sodium chloride   Intravenous Once  . sodium chloride   Intravenous Once  . amiodarone  200 mg Oral Daily  . apixaban  5 mg Oral BID  . Chlorhexidine  Gluconate Cloth  6 each Topical Daily  . darbepoetin (ARANESP) injection - NON-DIALYSIS  150 mcg Subcutaneous Q Mon-1800  . feeding supplement (ENSURE ENLIVE)  237 mL Oral TID BM  . feeding supplement (PROSource TF)  90 mL Per Tube BID  . folic acid  1 mg Oral Daily  . furosemide  80 mg Intravenous Q12H  . insulin aspart  0-15 Units Subcutaneous TID WC  . insulin aspart  0-5 Units Subcutaneous QHS  . insulin aspart  4 Units Subcutaneous Q4H  . lidocaine  5 mL Intradermal Once  . multivitamin with minerals  1 tablet Oral Daily  . nortriptyline  25 mg Oral QHS  . pantoprazole  40 mg Oral Daily  . predniSONE  5 mg Oral Q breakfast  . senna-docusate  1 tablet Oral Daily  . sodium bicarbonate  1,300 mg Oral BID  . tacrolimus  1 mg Oral BID  . thiamine  100 mg Oral Daily  . vitamin C  250 mg Oral BID  . zinc sulfate  220 mg Oral Daily   Continuous Infusions: . sodium chloride Stopped (05/11/20 1427)  . feeding supplement (OSMOLITE 1.5 CAL)       LOS: 13 days        Hosie Poisson, MD [facility]   To contact the attending provider between 7A-7P or the covering provider during after hours 7P-7A, please log into the web site [email or web address] and access using universal [place] password for that web site. If you do not have the password, please call the hospital operator.  05/14/2020, 5:19 PM

## 2020-05-14 NOTE — Progress Notes (Signed)
     Referral received for Wanda [name] :goals of care discussion. Chart reviewed and updates received from RN. Introduced myself and Palliative's role to patient at the bedside. Mrs. Wanda [name] is awake, A&O x3. She has requested water to drink and this was supplied. She is stating that she thought she was going to be discharged today. Explained I did not think she was. Patient pleasantly expressed this is what she understood from a discussion with someone yesterday. Support provided.   Patient's husband called while at the bedside. Patient expressing to him that she was probably going to be discharge. She was insisting her call and speak to someone.   Patient not interested in discussions at this time. Advised someone from our team will follow up with her at a later date/time. Patient verbalized understanding.   Detailed note and recommendations to follow once GOC has been completed.   Thank you for your referral and allowing PMT to assist in Mrs. Wanda [name]'s care.   Alda Lea, AGPCNP-BC Palliative Medicine Team  Phone: [phone]  NO CHARGE

## 2020-05-15 ENCOUNTER — Inpatient Hospital Stay (HOSPITAL_COMMUNITY): Payer: Medicare HMO

## 2020-05-15 DIAGNOSIS — Z515 Encounter for palliative care: Secondary | ICD-10-CM

## 2020-05-15 DIAGNOSIS — R627 Adult failure to thrive: Secondary | ICD-10-CM

## 2020-05-15 DIAGNOSIS — Z7189 Other specified counseling: Secondary | ICD-10-CM

## 2020-05-15 LAB — GLUCOSE, CAPILLARY
Glucose-Capillary: 212 mg/dL — ABNORMAL HIGH (ref 70–99)
Glucose-Capillary: 223 mg/dL — ABNORMAL HIGH (ref 70–99)
Glucose-Capillary: 189 mg/dL — ABNORMAL HIGH (ref 70–99)
Glucose-Capillary: 122 mg/dL — ABNORMAL HIGH (ref 70–99)
Glucose-Capillary: 87 mg/dL (ref 70–99)
Glucose-Capillary: 100 mg/dL — ABNORMAL HIGH (ref 70–99)

## 2020-05-15 LAB — CBC WITH DIFFERENTIAL/PLATELET
Abs Immature Granulocytes: 0.1 10*3/uL — ABNORMAL HIGH (ref 0.00–0.07)
Basophils Absolute: 0 10*3/uL (ref 0.0–0.1)
Basophils Relative: 0 %
Eosinophils Absolute: 0.1 10*3/uL (ref 0.0–0.5)
Eosinophils Relative: 1 %
HCT: 25.7 % — ABNORMAL LOW (ref 36.0–46.0)
Hemoglobin: 8.1 g/dL — ABNORMAL LOW (ref 12.0–15.0)
Immature Granulocytes: 1 %
Lymphocytes Relative: 19 %
Lymphs Abs: 1.4 10*3/uL (ref 0.7–4.0)
MCH: 30.2 pg (ref 26.0–34.0)
MCHC: 31.5 g/dL (ref 30.0–36.0)
MCV: 95.9 fL (ref 80.0–100.0)
Monocytes Absolute: 0.2 10*3/uL (ref 0.1–1.0)
Monocytes Relative: 3 %
Neutro Abs: 5.7 10*3/uL (ref 1.7–7.7)
Neutrophils Relative %: 76 %
Platelets: 210 10*3/uL (ref 150–400)
RBC: 2.68 MIL/uL — ABNORMAL LOW (ref 3.87–5.11)
RDW: 18.5 % — ABNORMAL HIGH (ref 11.5–15.5)
WBC: 7.5 10*3/uL (ref 4.0–10.5)
nRBC: 0 % (ref 0.0–0.2)

## 2020-05-15 LAB — RENAL FUNCTION PANEL
Albumin: 1.2 g/dL — ABNORMAL LOW (ref 3.5–5.0)
Anion gap: 10 (ref 5–15)
BUN: 74 mg/dL — ABNORMAL HIGH (ref 6–20)
CO2: 22 mmol/L (ref 22–32)
Calcium: 8.2 mg/dL — ABNORMAL LOW (ref 8.9–10.3)
Chloride: 103 mmol/L (ref 98–111)
Creatinine, Ser: 2.7 mg/dL — ABNORMAL HIGH (ref 0.44–1.00)
GFR calc Af Amer: 21 mL/min — ABNORMAL LOW (ref >60)
GFR calc non Af Amer: 19 mL/min — ABNORMAL LOW (ref >60)
Glucose, Bld: 220 mg/dL — ABNORMAL HIGH (ref 70–99)
Phosphorus: 2.7 mg/dL (ref 2.5–4.6)
Potassium: 5.6 mmol/L — ABNORMAL HIGH (ref 3.5–5.1)
Sodium: 135 mmol/L (ref 135–145)

## 2020-05-15 LAB — C-REACTIVE PROTEIN: CRP: 8.6 mg/dL — ABNORMAL HIGH (ref <1.0)

## 2020-05-15 LAB — MAGNESIUM: Magnesium: 2.1 mg/dL (ref 1.7–2.4)

## 2020-05-15 LAB — POTASSIUM: Potassium: 5.4 mmol/L — ABNORMAL HIGH (ref 3.5–5.1)

## 2020-05-15 MED ORDER — CALCIUM GLUCONATE-NACL 1-0.675 GM/50ML-% IV SOLN
1.0000 g | Freq: Once | INTRAVENOUS | Status: AC
  Administered 2020-05-15: 1000 mg via INTRAVENOUS
  Filled 2020-05-15: qty 50

## 2020-05-15 MED ORDER — FENTANYL CITRATE (PF) 100 MCG/2ML IJ SOLN
25.0000 ug | INTRAMUSCULAR | Status: DC | PRN
  Administered 2020-05-15 – 2020-05-17 (×3): 25 ug via INTRAVENOUS
  Administered 2020-05-26 – 2020-05-27 (×2): 50 ug via INTRAVENOUS
  Filled 2020-05-15 (×5): qty 2

## 2020-05-15 MED ORDER — SODIUM POLYSTYRENE SULFONATE 15 GM/60ML PO SUSP
30.0000 g | Freq: Once | ORAL | Status: AC
  Administered 2020-05-15: 30 g
  Filled 2020-05-15: qty 120

## 2020-05-15 MED ORDER — NEPRO/CARBSTEADY PO LIQD
910.0000 mL | ORAL | Status: DC
  Administered 2020-05-15 – 2020-05-18 (×2): 910 mL

## 2020-05-15 MED ORDER — SODIUM CHLORIDE 0.9 % IV SOLN: INTRAVENOUS | Status: AC

## 2020-05-15 NOTE — Consult Note (Addendum)
Palliative Medicine Inpatient Consult Note  Reason for consult:  Goals of Care  HPI:  Per intake H&P --> 59 year old lady with prior history of diabetes, hypertension, paroxysmal atrial fibrillation on Eliquis, hyperlipidemia, end-stage renal disease s/p failed kidney transplant in 2013 and repeat renal transplant in September 2020, stage IV CKD, anemia of chronic disease, history of gastric sleeve surgery, hypoalbuminemia, right foot ulcer, chronic neuropathy presents with right foot ulcer, fevers.  X-rays of the foot did not show any evidence of osteomyelitis she was started on IV antibiotics and fluids.  Palliative care was consulted for goals of care conversations in the setting of poor oral intake and muscular deconditioning.   Clinical Assessment/Goals of Care: I have reviewed medical records including EPIC notes, labs and imaging, received report from bedside RN patient has not been eating or drinking nor has she been mobilizing, assessed the patient who was lying in bed complaining of right hip pain.    I met with Wanda [name] to further discuss diagnosis prognosis, GOC, EOL wishes, disposition and options.   I introduced Palliative Medicine as specialized medical care for people living with serious illness. It focuses on providing relief from the symptoms and stress of a serious illness. The goal is to improve quality of life for both the patient and the family.  I asked Wanda [name] to tell me about herself. She states that she is from [place], she has lived here her whole life. She has been married for 60 one years to her husband, Wanda [name]. She has three children, two sons and one daughter. All of her children live locally. 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 She is a woman of faith and practices within the [employer] denomination.   IN terms of bADLs patient states that she does use a  cane and walker at home.  Wanda [name] states that since losing her right LE she has had feelings "like it's still there". We talked about phantom limb pain post amputation and how this can be alarming.   A detailed discussion was had today regarding advanced directives, patient shares that she has not completed these though she would rely on her husband Wanda [name] to make decisions for her if she were unable to do so for herself.   Concepts specific to code status, artifical feeding and hydration, continued IV antibiotics and rehospitalization was had.  I shared with Wanda [name] that resuscitation efforts in someone life her with frailty can often cause great trauma. I shared that it may be reasonable to consider DNR status as this would still enable her the opportunity to get treatment though if her heart stopped or breathing became severely impaired we would allow her body to pass naturally with symptom aid. Wanda [name] states that she would not want her grandchildren to see her being kept alive artificially or with "the tubes in". She expresses wishing to discuss this more with her husband. I introduced a MOST form as this time which we plan to complete during a meeting tomorrow morning.   We focused much of our conversation on her FTT. She endorses having a "good appetite" prior to coming into the hospital. She states that she most enjoys vanilla ice cream and orange sherbet.  I asked her what her understanding of the coretrack was. She seemed to not be able to recognize that it was providing artificial means of nutritional support. I asked  her if she would ever want a feeding tube long term though she was unable to answer this question.   The difference between a aggressive medical intervention path and a palliative comfort care path for this patient at this time was had. I shared that if Wanda [name] continues to not eat or drink that eventually her body will start to slowly shut down. We talked about what her wishes  are at this time. She shares that she truly just wants to spend time with her family.  I introduced the topic of hospice to her she stated some familiarity with it.  I asked her if we could continue this conversation tomorrow with her husband present, she was open to this.    Discussed the importance of continued conversation with family and their  medical providers regarding overall plan of care and treatment options, ensuring decisions are within the context of the patients values and GOCs.  Addendum: I was able to get into contact with patients husband, Wanda [name]. He has agreed to come in tomorrow at [facility] to have a conversation about Wanda [name]'s present health state and her personal goals moving forward.   Decision Maker: Wanda [name] (Husband) [phone]  SUMMARY OF RECOMMENDATIONS   Full code / Full scope for the time being --> I recommended consideration of DNR code status given patient chronic medical conditions and ongoing FTT.  Plan to meet with patient and her husband tomorrow morning at [facility], objectives for this meeting include discussing nutrition, therapy needs, and long term goals.   Spiritual Support  Code Status/Advance Care Planning: FULL CODE  Symptom Management:  Failure to Thrive:  - Dietician involved  - On Coretrack feedings  - Supplemental nutrients  Pain, generalized:  - Tyelnol 678m Q6H PRN  - Oxycodone 575mPO Q4H PRN  - Fentanyl 12.5-50mcg Q2H PRN - Please given instead of morphine given CKD  - Turn Q2H  Muscular Deconditioning:  - PT/OT   Palliative Prophylaxis:   Oral care, Mobility, Delirium precautions  Additional Recommendations (Limitations, Scope, Preferences): Continue current    Psycho-social/Spiritual:   Desire for further Chaplaincy support: Yes - [employer]  Additional Recommendations:  Education on hospice   Prognosis: Poor given patients ongoing failure to thrive  Discharge Planning: Discharge to skilled nursing home.   PPS:  20-30%   This conversation/these recommendations were discussed with patient primary care team, Dr. AkKarleen HampshireTime In: 130100ime Out: 1500 Total Time: 7069reater than 50%  of this time was spent counseling and coordinating care related to the above assessment and plan.  [place] Team Cell Phone: [phone] utilize secure chat with additional questions, if there is no response within 30 minutes please call the above phone number  Palliative Medicine Team providers are available by phone from 7am to 7pm daily and can be reached through the team cell phone.  Should this patient require assistance outside of these hours, please call the patient's attending physician.

## 2020-05-15 NOTE — Progress Notes (Signed)
Nutrition Brief Note  RD consulted for enteral nutrition management.   Current nocturnal tube feeding order is Osmolite 1.5 @ 80 ml x 14 hrs (6pm to 8am) with ProSource TF QID. This provides 1840 kcal, 114 g protein, 853 ml free water, and 2442 mg potassium.   Potassium continues to trend up despite Lokelma administration BID.   RD observed Osmolite 1.5 running at 80 ml/hr at 1600. Suspect tube feed running past given time likely contributes to increased potassium.   Patient reports husband has brought in food from home consisting of meat loaf and spaghetti. Intake of these meals are minimal per patient (bites). Drinking Ensure 1-2 times daily.   Change tube feeding:  -Nepro @ 65 ml/hr x 14 hrs via Cortrak (6pm to 8am) -90 ml ProSource BID  Provides: 1798 kcals, 117 grams protein, 662 ml free water. Meets 86% kcal needs and 100% of protein needs.   RD placed calorie count envelope on the patient's door. Nursing to document percent consumed for each item on the patient's meal tray ticket and percent of any supplement or snack pt consumes and keep documentation in envelope for RD to review. If patient unable to meet 75% of needs PO consistently, recommend placement of long term feeding tube if within [place].   Palliative to meet with family tomorrow.   Wanda [name] RD, LDN Clinical Nutrition Pager listed in [place]

## 2020-05-15 NOTE — Progress Notes (Signed)
Mobility Specialist: Progress Note    05/15/20 1702  Mobility  Activity  (Bed exercises)  Assistive Device None  Mobility Response Tolerated fair  Mobility performed by Mobility specialist  Bed Position Semi-fowlers  $Mobility charge 1 Mobility   Pre-Mobility: 104 HR, 50/24 BP Post-Mobility: 104 HR, 57/21 BP  Pt said she didn't feel like she could sit on EOB so we worked on UE bed exercises. Pt was hypotensive, nurse notified.   [facility] Jahir Halt Mobility Specialist

## 2020-05-15 NOTE — Progress Notes (Signed)
[facility] Progress Note   39X complicated  history  2, HTN ESRD s/p  2 kidney transplants, most recently at [facility] on 7/35/3299 complicated by DGF and dialysis dependence for approximately 2 months.  Post transplant course has been complicated by recurrent UTIs, progressive debility/failure to thrive, pneumonia, history of gastric sleeve.  She was admitted to [facility] 5/4 through 5/11 of this year with AKI, malnutrition.  She had sepsis related to a UTI from Klebsiella and Enterococcus.  Her discharge creatinine was around 3.0. Current transplant  treated w/  Tac  and prednisone.  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  It appears she also has been receiving infusions of belatacept.  She is on prophylactic Bactrim and Valcyte.  She presented  with progressive fever, chills, nausea.  She was found to have a foul-smelling foot ulcer on the right side with drainage, leukocytosis.  She was placed on ceftriaxone and vancomycin.  She received MRI of the foot overnight with findings of cellulitis but no clear evidence of osteomyelitis.  At the time of my evaluation the patient is groggy and not very informative.  Her current MAR has tacrolimus of 3 mg twice daily, but I believe she takes 1.5 mg twice daily, I am not able to confirm this with her.  She is also on prednisone.   Assessment/ Plan:   1. S/p renal transplant-  Second transplant at [facility] in September of 2020-  C/b delayed graft function.  Allograft function is not great with crt of around 3 - no acute change with hospitalization. 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  Some controversy regarding prograf dose- put on what we think -  Level high ish at 9- doseage taken down slightly and to recheck level on 8/11.  UOP fine on lasix - want to achieve diuresis-  crt staying poor but stable   Currently on Lasix 80mg  IV q12hr  Had been on oral HCO3 but has  since been stopped.   Lokelma ordered also started evening of 8/12 (BID); will also give a single dose of Kayexalate. She has only had 1 BM with the [facility]. Will also give 586ml of NS to facilitate distal tubule Na/K exchange.  Also requested dietician to reassess the nutritional sources; may want to change to nepro as the additional supplement -> they are concerned about the higher fat content given her abd fullness. D/w dietician and we are trying to determine how much total K in a 24hr period.   2. Foot ulcer drainage-  -  Now s/p BKA 05/07/2020 treated with Zosyn.  3. Anemia- fairly advanced, likely due to CKD-   giving ESA- no iron due to infection- transfuse PRN 4. HTN/volume- hypotension,  Proven to not be an issue once a line placed. All parameters in place to raise BP have been stopped.  Will continue lasix at same dose  5. FTT-  Therapies in place,  Albumin of 1.4 gives poor prognosis - on prosource -  Now cortrak placed  6. Metabolic acidosis-   oral bicarb - have inc dose-- hope will improve since has had BKA- not yet  7. PAF  Subjective:   Denies dyspnea, cough, nausea. Only BM yest.   Objective:   BP 121/68 (BP Location: Left Leg)   Pulse 88   Temp (!) 100.5 F (38.1 C) (Oral)   Resp 19   Ht 5\' 9"  (1.753 m)  Wt 68.9 kg   SpO2 100%   BMI 22.45 kg/m   Intake/Output Summary (Last 24 hours) at 05/15/2020 1358 Last data filed at 05/15/2020 0500 Gross per 24 hour  Intake 635 ml  Output 1500 ml  Net -865 ml   Weight change: -0.907 kg  Physical Exam: General:  Alert  Heart: RRR Lungs: mostly clear Abdomen: soft, non tender Extremities: pitting edema to dep areas -  bilat boots and bandages--- has right upper arm AVF that is patent     Imaging: No results found.  Labs: BMET Recent Labs  Lab 05/09/20 0071 05/09/20 2197 05/09/20 2056 05/10/20 0532 05/11/20 0410 05/12/20 0449 05/13/20 0436 05/14/20 0421 05/15/20 0250  NA 136  --   --  138 141 138 137 137  135  K 4.8  --   --  4.9 4.8 5.0 5.4* 5.7* 5.6*  CL 111  --   --  109 111 106 107 105 103  CO2 14*  --   --  15* 19* 19* 20* 21* 22  GLUCOSE 222*  --   --  295* 238* 190* 163* 202* 220*  BUN 52*  --   --  56* 57* 58* 62* 69* 74*  CREATININE 2.93*  --   --  2.94* 2.88* 2.84* 2.64* 2.70* 2.70*  CALCIUM 8.1*  --   --  7.8* 7.9* 8.0* 8.1* 8.2* 8.2*  PHOS 2.9   < > 2.6 2.7 2.5 2.5 2.6 2.8 2.7   < > = values in this interval not displayed.   CBC Recent Labs  Lab 05/12/20 0449 05/13/20 0436 05/14/20 0421 05/15/20 0250  WBC 9.3 8.7 9.8 7.5  NEUTROABS 7.2 6.3 7.7 5.7  HGB 8.9* 8.6* 8.9* 8.1*  HCT 27.7* 26.8* 28.2* 25.7*  MCV 93.6 95.7 94.6 95.9  PLT 187 192 228 210    Medications:    . sodium chloride   Intravenous Once  . sodium chloride   Intravenous Once  . amiodarone  200 mg Oral Daily  . apixaban  5 mg Oral BID  . Chlorhexidine Gluconate Cloth  6 each Topical Daily  . darbepoetin (ARANESP) injection - NON-DIALYSIS  150 mcg Subcutaneous Q Mon-1800  . feeding supplement (ENSURE ENLIVE)  237 mL Oral TID BM  . feeding supplement (PROSource TF)  90 mL Per Tube BID  . folic acid  1 mg Oral Daily  . furosemide  80 mg Intravenous Q12H  . insulin aspart  0-15 Units Subcutaneous TID WC  . insulin aspart  0-5 Units Subcutaneous QHS  . insulin aspart  4 Units Subcutaneous Q4H  . lidocaine  5 mL Intradermal Once  . multivitamin with minerals  1 tablet Oral Daily  . nortriptyline  25 mg Oral QHS  . pantoprazole  40 mg Oral Daily  . predniSONE  5 mg Oral Q breakfast  . senna-docusate  1 tablet Oral Daily  . sodium zirconium cyclosilicate  10 g Oral BID  . tacrolimus  1 mg Oral BID  . thiamine  100 mg Oral Daily  . vitamin C  250 mg Oral BID  . zinc sulfate  220 mg Oral Daily      Otelia Santee, MD 05/15/2020, 1:58 PM

## 2020-05-15 NOTE — Progress Notes (Signed)
PROGRESS NOTE    Wanda [name]  HTD:[id number] DOB: 1961/02/23 DOA: 05/01/2020 PCP: Lorene Dy, MD    Chief Complaint  Patient presents with   Skin Ulcer   Fatigue   Fever    Brief Narrative:   59 year old lady with prior history of diabetes, hypertension, paroxysmal atrial fibrillation on Eliquis, hyperlipidemia, end-stage renal disease s/p failed kidney transplant in 2013 and repeat renal transplant in September 2020, stage IV CKD, anemia of chronic disease, history of gastric sleeve surgery, hypoalbuminemia, right foot ulcer, chronic neuropathy presents with right foot ulcer, fevers.  X-rays of the foot did not show any evidence of osteomyelitis she was started on IV antibiotics and fluids. Pt continues to remain on tube feeds. As per RN, pt is eating less than 25% orally. She appears very deconditioned. Palliative care consulted for goals of care. Pt seen and examined at bedside. No new complaints at this time. Her oral intake appears to be poor.    Assessment & Plan:   Principal Problem:   Septic shock ([place]) Active Problems:   DM (diabetes mellitus), type 2 with renal complications (HCC)   Essential hypertension   CHRONIC KIDNEY DISEASE STAGE IV (SEVERE)   Renal transplant disorder   Depression   History of kidney transplant   Wound infection   Leukocytosis   Pressure injury of skin   Sepsis present on admission Right foot ulcer infected with surrounding cellulitis. Pt completed the course of zosyn.  X-ray does not show any evidence of osteomyelitis.  Cultures have been negative so far. MRI of the right foot without contrast did not show any evidence of osteomyelitis but showed superficial phlegmon overlying the posterior calcaneus. Wound care consulted for recommendations.  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  Patient appears very deconditioned. PT/OT EVAL recommending SNF on discharge. Pt is in agreement.     Hypotension Appears to  have resolved. BP parameters are optimal.    Stage IV CKD, metabolic acidosis the setting of end-stage renal disease. S/p renal transplant on chronic immunosuppressive medications/ Prograf. Creatinine  has remained stable since the last 2 days.  Nephrology was initially consulted and they have signed off at this time and recommended to continue with oral Lasix on discharge. Plan to transition to oral lasix 80 mg BID. Bicarb improved on sodium bicarb tabs.    Hyperkalemia with peaked t waves.  lokelma and kayexalate ordered with minimal improvement.  One dose of calcium gluconate ordered.  Repeat K tonight.  Nephrology consulted for further recommendations.    severe protein calorie malnutrition/cachexia. Poor oral intake,  cor track was placed and tube feeds were started.  She continues to have minimal oral intake.  Albumin is 1.2. Patient has significant pitting edema in the upper extremities.   She is on Lasix, continue the same.     Paroxysmal atrial fibrillation Rate controlled with amiodarone.  On Eliquis for anticoagulation. Echocardiogram showed Left ventricular ejection fraction, by estimation, is 70 to 75%. The left ventricle has hyperdynamic function. The left ventricle has no regional wall motion abnormalities. There is moderate concentric left  ventricular hypertrophy. Indeterminate  diastolic filling due to E-A fusion.  H er CHAD2 VASC2 score is 3 to 4.    Anemia of chronic disease/GI blood loss Postoperative anemia S/p 1 unit of PRBC transfusion. Transfuse to keep hemoglobin greater than 7. Hemoglobin stable around 8.    Hypoalbuminemia/cachexia Nutritionist  consulted and on tube feeds.   Type 2 diabetes mellitus with  nephropathy and neuropathy.  Continue with Lyrica. Continue with sliding scale insulin. CBG (last 3)  Recent Labs    05/15/20 0450 05/15/20 0854 05/15/20 1334  GLUCAP 212* 223* 189*   No changes in medications.    Fever Blood  cultures ordered.  CXR, UA ordered for further evaluation.                                                                                                                   Pressure injury  Pressure Injury 05/01/20 Heel Left Unstageable - Full thickness tissue loss in which the base of the injury is covered by slough (yellow, tan, gray, green or brown) and/or eschar (tan, brown or black) in the wound bed. dark discoloration unstageable bugg (Active)  05/01/20 2330  Location: Heel  Location Orientation: Left  Staging: Unstageable - Full thickness tissue loss in which the base of the injury is covered by slough (yellow, tan, gray, green or brown) and/or eschar (tan, brown or black) in the wound bed.  Wound Description (Comments): dark discoloration unstageable buggy  Present on Admission: Yes   Wound care consulted and recommendations given.       DVT prophylaxis: Eliquis.  Code Status: full code.  Family Communication: none at bedside.  Disposition:   Status is: Inpatient  Remains inpatient appropriate because:Unsafe d/c plan   Dispo: The patient is from: Home              Anticipated d/c is to: SNF              Anticipated d/c date is: 2 days              Patient currently is not medically stable to d/c.       Consultants:   Palliative.    Procedures: none.   Antimicrobials: none.   Subjective: Pt reports feeling good, no chest pain or sob, no nausea, vomiting.   Objective: Vitals:   05/15/20 0503 05/15/20 0545 05/15/20 0855 05/15/20 0955  BP: (!) 166/57 (!) 151/57 121/68   Pulse:  88    Resp:  16 18 19   Temp:   (!) 100.5 F (38.1 C)   TempSrc:   Oral   SpO2:  100%    Weight:    68.9 kg  Height:        Intake/Output Summary (Last 24 hours) at 05/15/2020 1519 Last data filed at 05/15/2020 0500 Gross per 24 hour  Intake 635 ml  Output 1500 ml  Net -865 ml   Filed Weights   05/14/20 0518 05/15/20 0500 05/15/20 0955  Weight: 49 kg 48.1 kg 68.9 kg     Examination:  General exam: alert and comfortable, not in distress.  Respiratory system: air entry fair bilateral , no wheezing or rhonchi.  Cardiovascular system: S1S2, RRR,no JVD. Gastrointestinal system: Abdomen is soft, non tender non distended bowel sounds wnl.  Central nervous system:alert and oriented and answering all questions.  Extremities: edema of the lower extremities.  Skin: heel ulcer  present.  Psychiatry:  Flat affect.      Data Reviewed: I have personally reviewed following labs and imaging studies  CBC: Recent Labs  Lab 05/11/20 0410 05/12/20 0449 05/13/20 0436 05/14/20 0421 05/15/20 0250  WBC 11.4* 9.3 8.7 9.8 7.5  NEUTROABS 9.3* 7.2 6.3 7.7 5.7  HGB 9.4* 8.9* 8.6* 8.9* 8.1*  HCT 28.8* 27.7* 26.8* 28.2* 25.7*  MCV 94.4 93.6 95.7 94.6 95.9  PLT 179 187 192 228 408    Basic Metabolic Panel: Recent Labs  Lab 05/11/20 0410 05/12/20 0449 05/13/20 0436 05/14/20 0421 05/15/20 0250  NA 141 138 137 137 135  K 4.8 5.0 5.4* 5.7* 5.6*  CL 111 106 107 105 103  CO2 19* 19* 20* 21* 22  GLUCOSE 238* 190* 163* 202* 220*  BUN 57* 58* 62* 69* 74*  CREATININE 2.88* 2.84* 2.64* 2.70* 2.70*  CALCIUM 7.9* 8.0* 8.1* 8.2* 8.2*  MG 2.1 2.1 1.9 2.1 2.1  PHOS 2.5 2.5 2.6 2.8 2.7    GFR: Estimated Creatinine Clearance: 23.4 mL/min (A) (by C-G formula based on SCr of 2.7 mg/dL (H)).  Liver Function Tests: Recent Labs  Lab 05/11/20 0410 05/12/20 0449 05/13/20 0436 05/14/20 0421 05/15/20 0250  ALBUMIN 1.3* 1.2* 1.2* 1.3* 1.2*    CBG: Recent Labs  Lab 05/14/20 2026 05/14/20 2356 05/15/20 0450 05/15/20 0854 05/15/20 1334  GLUCAP 172* 179* 212* 223* 189*     No results found for this or any previous visit (from the past 240 hour(s)).       Radiology Studies: No results found.      Scheduled Meds:  sodium chloride   Intravenous Once   sodium chloride   Intravenous Once   amiodarone  200 mg Oral Daily   apixaban  5 mg Oral BID    Chlorhexidine Gluconate Cloth  6 each Topical Daily   darbepoetin (ARANESP) injection - NON-DIALYSIS  150 mcg Subcutaneous Q Mon-1800   feeding supplement (ENSURE ENLIVE)  237 mL Oral TID BM   feeding supplement (PROSource TF)  90 mL Per Tube BID   folic acid  1 mg Oral Daily   furosemide  80 mg Intravenous Q12H   insulin aspart  0-15 Units Subcutaneous TID WC   insulin aspart  0-5 Units Subcutaneous QHS   insulin aspart  4 Units Subcutaneous Q4H   lidocaine  5 mL Intradermal Once   multivitamin with minerals  1 tablet Oral Daily   nortriptyline  25 mg Oral QHS   pantoprazole  40 mg Oral Daily   predniSONE  5 mg Oral Q breakfast   senna-docusate  1 tablet Oral Daily   sodium polystyrene  30 g Per Tube Once   sodium zirconium cyclosilicate  10 g Oral BID   tacrolimus  1 mg Oral BID   thiamine  100 mg Oral Daily   vitamin C  250 mg Oral BID   zinc sulfate  220 mg Oral Daily   Continuous Infusions:  sodium chloride Stopped (05/11/20 1427)   sodium chloride 75 mL/hr at 05/15/20 1449   feeding supplement (OSMOLITE 1.5 CAL) 1,120 mL (05/14/20 1720)     LOS: 14 days        Hosie Poisson, MD [facility]   To contact the attending provider between 7A-7P or the covering provider during after hours 7P-7A, please log into the web site [email or web address] and access using universal [place] password for that web site. If you do not have the password, please call the hospital  operator.  05/15/2020, 3:19 PM

## 2020-05-15 NOTE — TOC Progression Note (Signed)
Transition of Care [facility]) - Progression Note    Patient Details  Name: SAYSHA MENTA MRN: [id number] Date of Birth: October 09, 1960  Transition of Care [facility] CM/SW [place], [place] Phone Number: 05/15/2020, 2:23 PM  Clinical Narrative:     Patient still has cotrak. Patient can not discharge to SNF with cortrak.   Thurmond Butts, MSW, [place] Clinical Social Worker        Expected Discharge Plan and Services                                                 Social Determinants of Health (SDOH) Interventions    Readmission Risk Interventions Readmission Risk Prevention Plan 02/06/2020  Transportation Screening Complete  PCP or Specialist Appt within 3-5 Days Complete  HRI or [place] Complete  Social Work Consult for [place] Planning/Counseling Complete  Palliative Care Screening Not Applicable  Medication Review [employer] photographer) Complete  Some recent data might be hidden

## 2020-05-16 ENCOUNTER — Inpatient Hospital Stay (HOSPITAL_COMMUNITY): Payer: Medicare HMO

## 2020-05-16 LAB — BLOOD CULTURE ID PANEL (REFLEXED) - BCID2

## 2020-05-16 LAB — CBC WITH DIFFERENTIAL/PLATELET
Abs Immature Granulocytes: 0.1 10*3/uL — ABNORMAL HIGH (ref 0.00–0.07)
Basophils Absolute: 0 10*3/uL (ref 0.0–0.1)
Basophils Relative: 0 %
Eosinophils Absolute: 0 10*3/uL (ref 0.0–0.5)
Eosinophils Relative: 1 %
HCT: 24.7 % — ABNORMAL LOW (ref 36.0–46.0)
Hemoglobin: 7.7 g/dL — ABNORMAL LOW (ref 12.0–15.0)
Immature Granulocytes: 1 %
Lymphocytes Relative: 15 %
Lymphs Abs: 1.2 10*3/uL (ref 0.7–4.0)
MCH: 30.3 pg (ref 26.0–34.0)
MCHC: 31.2 g/dL (ref 30.0–36.0)
MCV: 97.2 fL (ref 80.0–100.0)
Monocytes Absolute: 0.2 10*3/uL (ref 0.1–1.0)
Monocytes Relative: 3 %
Neutro Abs: 6.2 10*3/uL (ref 1.7–7.7)
Neutrophils Relative %: 80 %
Platelets: 223 10*3/uL (ref 150–400)
RBC: 2.54 MIL/uL — ABNORMAL LOW (ref 3.87–5.11)
RDW: 18.8 % — ABNORMAL HIGH (ref 11.5–15.5)
WBC: 7.7 10*3/uL (ref 4.0–10.5)
nRBC: 0 % (ref 0.0–0.2)

## 2020-05-16 LAB — RENAL FUNCTION PANEL
Albumin: 1.2 g/dL — ABNORMAL LOW (ref 3.5–5.0)
Anion gap: 11 (ref 5–15)
BUN: 82 mg/dL — ABNORMAL HIGH (ref 6–20)
CO2: 22 mmol/L (ref 22–32)
Calcium: 8.4 mg/dL — ABNORMAL LOW (ref 8.9–10.3)
Chloride: 101 mmol/L (ref 98–111)
Creatinine, Ser: 2.54 mg/dL — ABNORMAL HIGH (ref 0.44–1.00)
GFR calc Af Amer: 23 mL/min — ABNORMAL LOW (ref >60)
GFR calc non Af Amer: 20 mL/min — ABNORMAL LOW (ref >60)
Glucose, Bld: 124 mg/dL — ABNORMAL HIGH (ref 70–99)
Phosphorus: 3.6 mg/dL (ref 2.5–4.6)
Potassium: 4.9 mmol/L (ref 3.5–5.1)
Sodium: 134 mmol/L — ABNORMAL LOW (ref 135–145)

## 2020-05-16 LAB — GLUCOSE, CAPILLARY
Glucose-Capillary: 106 mg/dL — ABNORMAL HIGH (ref 70–99)
Glucose-Capillary: 124 mg/dL — ABNORMAL HIGH (ref 70–99)
Glucose-Capillary: 126 mg/dL — ABNORMAL HIGH (ref 70–99)
Glucose-Capillary: 155 mg/dL — ABNORMAL HIGH (ref 70–99)
Glucose-Capillary: 162 mg/dL — ABNORMAL HIGH (ref 70–99)
Glucose-Capillary: 183 mg/dL — ABNORMAL HIGH (ref 70–99)
Glucose-Capillary: 75 mg/dL (ref 70–99)
Glucose-Capillary: 86 mg/dL (ref 70–99)
Glucose-Capillary: 92 mg/dL (ref 70–99)
Glucose-Capillary: 98 mg/dL (ref 70–99)

## 2020-05-16 LAB — PROCALCITONIN: Procalcitonin: 3.05 ng/mL

## 2020-05-16 LAB — MAGNESIUM: Magnesium: 1.9 mg/dL (ref 1.7–2.4)

## 2020-05-16 LAB — POTASSIUM: Potassium: 5.2 mmol/L — ABNORMAL HIGH (ref 3.5–5.1)

## 2020-05-16 LAB — C-REACTIVE PROTEIN: CRP: 10.9 mg/dL — ABNORMAL HIGH (ref <1.0)

## 2020-05-16 MED ORDER — SODIUM CHLORIDE 0.9 % IV SOLN
2.0000 g | INTRAVENOUS | Status: DC
  Administered 2020-05-16 – 2020-05-17 (×2): 2 g via INTRAVENOUS
  Filled 2020-05-16 (×3): qty 2

## 2020-05-16 MED ORDER — SODIUM BICARBONATE 650 MG PO TABS
650.0000 mg | ORAL_TABLET | Freq: Two times a day (BID) | ORAL | Status: DC
  Administered 2020-05-16 – 2020-06-01 (×33): 650 mg via ORAL
  Filled 2020-05-16 (×34): qty 1

## 2020-05-16 NOTE — Progress Notes (Signed)
PROGRESS NOTE    Wanda [name]  ZMO:[id number] DOB: 1961/05/14 DOA: 05/01/2020 PCP: Lorene Dy, MD    Chief Complaint  Patient presents with  . Skin Ulcer  . Fatigue  . Fever    Brief Narrative:   59 year old lady with prior history of diabetes, hypertension, paroxysmal atrial fibrillation on Eliquis, hyperlipidemia, end-stage renal disease s/p failed kidney transplant in 2013 and repeat renal transplant in September 2020, stage IV CKD, anemia of chronic disease, history of gastric sleeve surgery, hypoalbuminemia, right foot ulcer, chronic neuropathy presents with right foot ulcer, fevers.  X-rays of the foot did not show any evidence of osteomyelitis she was started on IV antibiotics and fluids. Pt continues to remain on tube feeds. As per RN, pt is eating less than 25% orally. She appears very deconditioned. Palliative care consulted for goals of care. Hospital course is complicated by fever and klebsiella  Aerogenes bacteremia. She was started on cefepime.   Assessment & Plan:   Principal Problem:   Septic shock ([place]) Active Problems:   DM (diabetes mellitus), type 2 with renal complications (HCC)   Essential hypertension   CHRONIC KIDNEY DISEASE STAGE IV (SEVERE)   Renal transplant disorder   Depression   History of kidney transplant   Wound infection   Leukocytosis   Pressure injury of skin   Palliative care by specialist   Goals of care, counseling/discussion   DNR (do not resuscitate) discussion   Failure to thrive in adult   Sepsis present on admission Right foot ulcer infected with surrounding cellulitis. Pt completed the course of zosyn.  X-ray does not show any evidence of osteomyelitis.  MRI of the right foot without contrast did not show any evidence of osteomyelitis but showed superficial phlegmon overlying the posterior calcaneus. Wound care consulted for recommendations.  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  Patient appears very  deconditioned. PT/OT EVAL recommending SNF on discharge. Pt is in agreement.  Pt reports pain in the left foot. X rays of the foot ordered.  Blood cultures from 05/15/2020 positive for klebsiella aerogenes. She was started on cefepime.    Hypotension Appears to have resolved. BP parameters are optimal.    Stage IV CKD, metabolic acidosis the setting of end-stage renal disease. S/p renal transplant on chronic immunosuppressive medications/ Prograf. Creatinine is stable.  Bicarb improved on sodium bicarb tabs.    Hyperkalemia with peaked t waves.  Improvement of potassium from 5.7 to 5.2.  lokelma ordered.  Nephrology consulted for further recommendations.    severe protein calorie malnutrition/cachexia. Poor oral intake,  cor track was placed and tube feeds were started.  She continues to have minimal oral intake.  Albumin is 1.2. In view of poor oral intake, IR consulted to see if she is a candidate for PEG placement.     Paroxysmal atrial fibrillation Rate controlled with amiodarone 200 mg daily.  On Eliquis for anticoagulation. Echocardiogram showed Left ventricular ejection fraction, by estimation, is 70 to 75%. The left ventricle has hyperdynamic function. The left ventricle has no regional wall motion abnormalities. There is moderate concentric left  ventricular hypertrophy. Indeterminate  diastolic filling due to E-A fusion.  H er CHAD2 VASC2 score is 3 to 4.    Anemia of chronic disease/GI blood loss Postoperative anemia S/p 1 unit of PRBC transfusion. Transfuse to keep hemoglobin greater than 7. Hemoglobin stable between 7 to 8.    Hypoalbuminemia/cachexia Nutritionist  consulted and on tube feeds.   Type 2  diabetes mellitus with nephropathy and neuropathy.  Continue with Lyrica. Continue with sliding scale insulin. CBG (last 3)  Recent Labs    05/16/20 0634 05/16/20 0834 05/16/20 1146  GLUCAP 106* 124* 126*   No changes in medications.    Fever With  klebsiella bacteremia.pt started on cefepime. CRP is 10.9.                                           Pressure injury Pressure Injury 05/01/20 Heel Left Unstageable - Full thickness tissue loss in which the base of the injury is covered by slough (yellow, tan, gray, green or brown) and/or eschar (tan, brown or black) in the wound bed. dark discoloration unstageable bugg (Active)  05/01/20 2330  Location: Heel  Location Orientation: Left  Staging: Unstageable - Full thickness tissue loss in which the base of the injury is covered by slough (yellow, tan, gray, green or brown) and/or eschar (tan, brown or black) in the wound bed.  Wound Description (Comments): dark discoloration unstageable buggy  Present on Admission: Yes   Wound care consulted and recommendations given.       DVT prophylaxis: Eliquis.  Code Status: full code.  Family Communication: husband at bedside.  Disposition:   Status is: Inpatient  Remains inpatient appropriate because:Ongoing diagnostic testing needed not appropriate for outpatient work up, IV treatments appropriate due to intensity of illness or inability to take PO and Inpatient level of care appropriate due to severity of illness   Dispo: The patient is from: Home              Anticipated d/c is to: SNF              Anticipated d/c date is: 2 days              Patient currently is not medically stable to d/c.       Consultants:   Palliative.   Orthopedics.    Procedures: s/p right BKA   Antimicrobials:  Anti-infectives (From admission, onward)   Start     Dose/Rate Route Frequency Ordered Stop   05/16/20 1400  ceFEPIme (MAXIPIME) 2 g in sodium chloride 0.9 % 100 mL IVPB     Discontinue     2 g 200 mL/hr over 30 Minutes Intravenous Every 24 hours 05/16/20 1351     05/07/20 1400  vancomycin (VANCOCIN) IVPB 1000 mg/200 mL premix        1,000 mg 200 mL/hr over 60 Minutes Intravenous On call to O.R. 05/07/20 0741 05/07/20 1550   05/05/20  1330  piperacillin-tazobactam (ZOSYN) IVPB 3.375 g  Status:  Discontinued        3.375 g 12.5 mL/hr over 240 Minutes Intravenous Every 8 hours 05/05/20 1251 05/12/20 1250   05/02/20 1600  vancomycin (VANCOREADY) IVPB 750 mg/150 mL  Status:  Discontinued        750 mg 150 mL/hr over 60 Minutes Intravenous Every 24 hours 05/01/20 1612 05/05/20 1251   05/02/20 1600  cefTRIAXone (ROCEPHIN) 2 g in sodium chloride 0.9 % 100 mL IVPB  Status:  Discontinued        2 g 200 mL/hr over 30 Minutes Intravenous Every 24 hours 05/01/20 1700 05/05/20 1250   05/01/20 1715  valGANciclovir (VALCYTE) 450 MG tablet TABS 450 mg  Status:  Discontinued        450 mg Oral Daily  05/01/20 1700 05/01/20 1847   05/01/20 1500  cefTRIAXone (ROCEPHIN) 2 g in sodium chloride 0.9 % 100 mL IVPB        2 g 200 mL/hr over 30 Minutes Intravenous  Once 05/01/20 1452 05/01/20 1638   05/01/20 1500  vancomycin (VANCOREADY) IVPB 1500 mg/300 mL        1,500 mg 150 mL/hr over 120 Minutes Intravenous  Once 05/01/20 1453 05/01/20 1755   05/01/20 1445  vancomycin (VANCOCIN) IVPB 1000 mg/200 mL premix  Status:  Discontinued        1,000 mg 200 mL/hr over 60 Minutes Intravenous  Once 05/01/20 1444 05/01/20 1453       Subjective: PT seen and examined at bedside.  Pt reports pain in the left foot. Fever of 103 this morning.  Palliative meeting today done.   Objective: Vitals:   05/16/20 0500 05/16/20 0505 05/16/20 0930 05/16/20 1020  BP:      Pulse:  (!) 105    Resp:  19 20 20   Temp:   (!) 103 F (39.4 C) 99 F (37.2 C)  TempSrc:    Oral  SpO2:      Weight: 48.5 kg     Height:        Intake/Output Summary (Last 24 hours) at 05/16/2020 1357 Last data filed at 05/16/2020 2263 Gross per 24 hour  Intake 844.4 ml  Output --  Net 844.4 ml   Filed Weights   05/15/20 0500 05/15/20 0955 05/16/20 0500  Weight: 48.1 kg 68.9 kg 48.5 kg    Examination:  General exam: alert in mild distress,  Respiratory system: Diminished  air entry at bases, no wheezing or rhonchi Cardiovascular system: S1-S2 heard, regular rate and rhythm, no JVD Gastrointestinal system: Abdomen is soft, nontender, nondistended, bowel sounds normal Central nervous system: Alert and oriented and answering questions appropriately. Extremities: Right BKA, left lower extremity tenderness and left foot is bandaged  skin: Left heel ulcer present Psychiatry: Mood is appropriate    Data Reviewed: I have personally reviewed following labs and imaging studies  CBC: Recent Labs  Lab 05/12/20 0449 05/13/20 0436 05/14/20 0421 05/15/20 0250 05/16/20 0500  WBC 9.3 8.7 9.8 7.5 7.7  NEUTROABS 7.2 6.3 7.7 5.7 6.2  HGB 8.9* 8.6* 8.9* 8.1* 7.7*  HCT 27.7* 26.8* 28.2* 25.7* 24.7*  MCV 93.6 95.7 94.6 95.9 97.2  PLT 187 192 228 210 335    Basic Metabolic Panel: Recent Labs  Lab 05/12/20 0449 05/12/20 0449 05/13/20 0436 05/13/20 0436 05/14/20 0421 05/15/20 0250 05/15/20 2237 05/16/20 0500 05/16/20 0726  NA 138  --  137  --  137 135  --  134*  --   K 5.0   < > 5.4*   < > 5.7* 5.6* 5.4* 4.9 5.2*  CL 106  --  107  --  105 103  --  101  --   CO2 19*  --  20*  --  21* 22  --  22  --   GLUCOSE 190*  --  163*  --  202* 220*  --  124*  --   BUN 58*  --  62*  --  69* 74*  --  82*  --   CREATININE 2.84*  --  2.64*  --  2.70* 2.70*  --  2.54*  --   CALCIUM 8.0*  --  8.1*  --  8.2* 8.2*  --  8.4*  --   MG 2.1  --  1.9  --  2.1 2.1  --  1.9  --   PHOS 2.5  --  2.6  --  2.8 2.7  --  3.6  --    < > = values in this interval not displayed.    GFR: Estimated Creatinine Clearance: 18.3 mL/min (A) (by C-G formula based on SCr of 2.54 mg/dL (H)).  Liver Function Tests: Recent Labs  Lab 05/12/20 0449 05/13/20 0436 05/14/20 0421 05/15/20 0250 05/16/20 0500  ALBUMIN 1.2* 1.2* 1.3* 1.2* 1.2*    CBG: Recent Labs  Lab 05/16/20 0045 05/16/20 0404 05/16/20 0634 05/16/20 0834 05/16/20 1146  GLUCAP 98 86 106* 124* 126*     Recent Results  (from the past 240 hour(s))  Culture, blood (Routine X 2) w Reflex to ID Panel     Status: None (Preliminary result)   Collection Time: 05/15/20 10:21 PM   Specimen: BLOOD LEFT HAND  Result Value Ref Range Status   Specimen Description BLOOD LEFT HAND  Final   Special Requests   Final    BOTTLES DRAWN AEROBIC AND ANAEROBIC Blood Culture adequate volume   Culture  Setup Time   Final    GRAM NEGATIVE RODS IN BOTH AEROBIC AND ANAEROBIC BOTTLES CRITICAL RESULT CALLED TO, READ BACK BY AND VERIFIED WITH: C. KO PHARMD, AT 1333 05/16/20 BY D. VANHOOK Performed at [facility], [place] [address] [place], [place] [address]    Culture GRAM NEGATIVE RODS  Final   Report Status PENDING  Incomplete  Blood Culture ID Panel (Reflexed)     Status: Abnormal   Collection Time: 05/15/20 10:21 PM  Result Value Ref Range Status   Enterococcus faecalis NOT DETECTED NOT DETECTED Final   Enterococcus Faecium NOT DETECTED NOT DETECTED Final   Listeria monocytogenes NOT DETECTED NOT DETECTED Final   Staphylococcus species NOT DETECTED NOT DETECTED Final   Staphylococcus aureus (BCID) NOT DETECTED NOT DETECTED Final   Staphylococcus epidermidis NOT DETECTED NOT DETECTED Final   Staphylococcus lugdunensis NOT DETECTED NOT DETECTED Final   Streptococcus species NOT DETECTED NOT DETECTED Final   Streptococcus agalactiae NOT DETECTED NOT DETECTED Final   Streptococcus pneumoniae NOT DETECTED NOT DETECTED Final   Streptococcus pyogenes NOT DETECTED NOT DETECTED Final   A.calcoaceticus-baumannii NOT DETECTED NOT DETECTED Final   Bacteroides fragilis NOT DETECTED NOT DETECTED Final   Enterobacterales DETECTED (A) NOT DETECTED Final    Comment: Enterobacterales represent a large order of gram negative bacteria, not a single organism. CRITICAL RESULT CALLED TO, READ BACK BY AND VERIFIED WITH: C. KO PHARMD, AT 1333 05/16/20 BY D. VANHOOK    Enterobacter cloacae complex NOT DETECTED NOT DETECTED Final   Escherichia  coli NOT DETECTED NOT DETECTED Final   Klebsiella aerogenes DETECTED (A) NOT DETECTED Final    Comment: CRITICAL RESULT CALLED TO, READ BACK BY AND VERIFIED WITH: C. KO PHARMD, AT 1333 05/16/20 BY D. VANHOOK    Klebsiella oxytoca NOT DETECTED NOT DETECTED Final   Klebsiella pneumoniae NOT DETECTED NOT DETECTED Final   Proteus species NOT DETECTED NOT DETECTED Final   Salmonella species NOT DETECTED NOT DETECTED Final   Serratia marcescens NOT DETECTED NOT DETECTED Final   Haemophilus influenzae NOT DETECTED NOT DETECTED Final   Neisseria meningitidis NOT DETECTED NOT DETECTED Final   Pseudomonas aeruginosa NOT DETECTED NOT DETECTED Final   Stenotrophomonas maltophilia NOT DETECTED NOT DETECTED Final   Candida albicans NOT DETECTED NOT DETECTED Final   Candida auris NOT DETECTED NOT DETECTED Final   Candida glabrata NOT  DETECTED NOT DETECTED Final   Candida krusei NOT DETECTED NOT DETECTED Final   Candida parapsilosis NOT DETECTED NOT DETECTED Final   Candida tropicalis NOT DETECTED NOT DETECTED Final   Cryptococcus neoformans/gattii NOT DETECTED NOT DETECTED Final   CTX-M ESBL NOT DETECTED NOT DETECTED Final   Carbapenem resistance IMP NOT DETECTED NOT DETECTED Final   Carbapenem resistance KPC NOT DETECTED NOT DETECTED Final   Carbapenem resistance NDM NOT DETECTED NOT DETECTED Final   Carbapenem resist OXA 48 LIKE NOT DETECTED NOT DETECTED Final   Carbapenem resistance VIM NOT DETECTED NOT DETECTED Final    Comment: Performed at [facility], [place] [address], [place], [place] [address]  Culture, blood (Routine X 2) w Reflex to ID Panel     Status: None (Preliminary result)   Collection Time: 05/15/20 10:35 PM   Specimen: BLOOD LEFT ARM  Result Value Ref Range Status   Specimen Description BLOOD LEFT ARM  Final   Special Requests   Final    BOTTLES DRAWN AEROBIC AND ANAEROBIC Blood Culture adequate volume   Culture  Setup Time   Final    GRAM NEGATIVE RODS IN BOTH AEROBIC  AND ANAEROBIC BOTTLES CRITICAL VALUE NOTED.  VALUE IS CONSISTENT WITH PREVIOUSLY REPORTED AND CALLED VALUE. Performed at [facility], [place] [address] [place],  [place] [address]    Culture GRAM NEGATIVE RODS  Final   Report Status PENDING  Incomplete         Radiology Studies: DG Chest 2 View  Result Date: 05/15/2020 CLINICAL DATA:  59 year old female with fever. EXAM: CHEST - 2 VIEW COMPARISON:  Chest radiograph dated 05/01/2020 FINDINGS: Feeding tube extends below the diaphragm with tip beyond the inferior margin of the image. Shallow inspiration. Diffuse bilateral interstitial prominence and hazy airspace density most likely represent edema and small bilateral pleural effusions. Pneumonia is not excluded. Clinical correlation is recommended. There is no pneumothorax. The cardiac silhouette is within limits. No acute osseous pathology. IMPRESSION: Findings most likely represent edema and small bilateral pleural effusions. Pneumonia is not excluded. Electronically Signed   By: Anner Crete M.D.   On: 05/15/2020 20:13        Scheduled Meds: . sodium chloride   Intravenous Once  . sodium chloride   Intravenous Once  . amiodarone  200 mg Oral Daily  . apixaban  5 mg Oral BID  . Chlorhexidine Gluconate Cloth  6 each Topical Daily  . darbepoetin (ARANESP) injection - NON-DIALYSIS  150 mcg Subcutaneous Q Mon-1800  . feeding supplement (ENSURE ENLIVE)  237 mL Oral TID BM  . feeding supplement (NEPRO CARB STEADY)  910 mL Per Tube Q24H  . feeding supplement (PROSource TF)  90 mL Per Tube BID  . folic acid  1 mg Oral Daily  . insulin aspart  0-15 Units Subcutaneous TID WC  . insulin aspart  0-5 Units Subcutaneous QHS  . insulin aspart  4 Units Subcutaneous Q4H  . lidocaine  5 mL Intradermal Once  . multivitamin with minerals  1 tablet Oral Daily  . nortriptyline  25 mg Oral QHS  . pantoprazole  40 mg Oral Daily  . predniSONE  5 mg Oral Q breakfast  . senna-docusate  1 tablet  Oral Daily  . sodium bicarbonate  650 mg Oral BID  . sodium zirconium cyclosilicate  10 g Oral BID  . tacrolimus  1 mg Oral BID  . thiamine  100 mg Oral Daily  . vitamin C  250  mg Oral BID  . zinc sulfate  220 mg Oral Daily   Continuous Infusions: . sodium chloride Stopped (05/11/20 1427)  . ceFEPime (MAXIPIME) IV       LOS: 15 days        Hosie Poisson, MD [facility]   To contact the attending provider between 7A-7P or the covering provider during after hours 7P-7A, please log into the web site [email or web address] and access using universal [place] password for that web site. If you do not have the password, please call the hospital operator.  05/16/2020, 1:57 PM

## 2020-05-16 NOTE — Progress Notes (Addendum)
PHARMACY - PHYSICIAN COMMUNICATION CRITICAL VALUE ALERT - BLOOD CULTURE IDENTIFICATION (BCID)  Wanda [name] is an 59 y.o. female who presented to [facility] on 05/01/2020 with a chief complaint of right foot ulcer drainage and fever. Patient previously completed course of Zosyn on 8/10 and underwent BKA 8/5 but became febrile 8/13 and repeat blood cultures were drawn.   Assessment:  4/4 bottles growing Klebsiella aerogenes (no ESBL detected). Noted - "Rash" allergy reported to cephalosporins from 2017 documentation; however, patient has tolerated cephalosporins here multiple times in the past, mostly recently as 02/2020.  Name of physician (or Provider) ContactedKarleen Hampshire via Epic secure message  Current antibiotics: none  Changes to prescribed antibiotics recommended:  Recommendations accepted by provider - Cefepime 2g IV q24h  Results for orders placed or performed during the hospital encounter of 05/01/20  Blood Culture ID Panel (Reflexed) (Collected: 05/15/2020 10:21 PM)  Result Value Ref Range   Enterococcus faecalis NOT DETECTED NOT DETECTED   Enterococcus Faecium NOT DETECTED NOT DETECTED   Listeria monocytogenes NOT DETECTED NOT DETECTED   Staphylococcus species NOT DETECTED NOT DETECTED   Staphylococcus aureus (BCID) NOT DETECTED NOT DETECTED   Staphylococcus epidermidis NOT DETECTED NOT DETECTED   Staphylococcus lugdunensis NOT DETECTED NOT DETECTED   Streptococcus species NOT DETECTED NOT DETECTED   Streptococcus agalactiae NOT DETECTED NOT DETECTED   Streptococcus pneumoniae NOT DETECTED NOT DETECTED   Streptococcus pyogenes NOT DETECTED NOT DETECTED   A.calcoaceticus-baumannii NOT DETECTED NOT DETECTED   Bacteroides fragilis NOT DETECTED NOT DETECTED   Enterobacterales DETECTED (A) NOT DETECTED   Enterobacter cloacae complex NOT DETECTED NOT DETECTED   Escherichia coli NOT DETECTED NOT DETECTED   Klebsiella aerogenes DETECTED (A) NOT DETECTED   Klebsiella oxytoca NOT  DETECTED NOT DETECTED   Klebsiella pneumoniae NOT DETECTED NOT DETECTED   Proteus species NOT DETECTED NOT DETECTED   Salmonella species NOT DETECTED NOT DETECTED   Serratia marcescens NOT DETECTED NOT DETECTED   Haemophilus influenzae NOT DETECTED NOT DETECTED   Neisseria meningitidis NOT DETECTED NOT DETECTED   Pseudomonas aeruginosa NOT DETECTED NOT DETECTED   Stenotrophomonas maltophilia NOT DETECTED NOT DETECTED   Candida albicans NOT DETECTED NOT DETECTED   Candida auris NOT DETECTED NOT DETECTED   Candida glabrata NOT DETECTED NOT DETECTED   Candida krusei NOT DETECTED NOT DETECTED   Candida parapsilosis NOT DETECTED NOT DETECTED   Candida tropicalis NOT DETECTED NOT DETECTED   Cryptococcus neoformans/gattii NOT DETECTED NOT DETECTED   CTX-M ESBL NOT DETECTED NOT DETECTED   Carbapenem resistance IMP NOT DETECTED NOT DETECTED   Carbapenem resistance KPC NOT DETECTED NOT DETECTED   Carbapenem resistance NDM NOT DETECTED NOT DETECTED   Carbapenem resist OXA 48 LIKE NOT DETECTED NOT DETECTED   Carbapenem resistance VIM NOT DETECTED NOT DETECTED   Arturo Morton, PharmD, BCPS Please check AMION for all [place] contact numbers Clinical Pharmacist 05/16/2020 1:47 PM

## 2020-05-16 NOTE — Progress Notes (Addendum)
Palliative Medicine Inpatient Follow Up Note  Reason for consult:  Goals of Care  HPI:  Per intake H&P --> 59 year old lady with prior history of diabetes, hypertension, paroxysmal atrial fibrillation on Eliquis, hyperlipidemia, end-stage renal disease s/p failed kidney transplant in 2013 and repeat renal transplant in September 2020, stage IV CKD, anemia of chronic disease, history of gastric sleeve surgery, hypoalbuminemia, right foot ulcer, chronic neuropathy presents with right foot ulcer, fevers. X-rays of the foot did not show any evidence of osteomyelitis she was started on IV antibiotics and fluids.  Palliative care was consulted for goals of care conversations in the setting of poor oral intake and muscular deconditioning.   Today's Discussion (05/16/2020): Chart reviewed. I met with Wanda [name] and Wanda [name] at bedside, their daughter, Wanda [name] was on the speaker phone. We reviewed Wanda [name]'s clinical course to date inclusive of her AKA and FTT.   Wanda [name] and Wanda [name] encouraged their mother to eat. Wanda [name] got quite upset sharing that he too has health ailments and if Wanda [name] does not eat he will not be able to take care of her at home. Wanda [name] stated to them that she will eat. We reviewed that with FTT it can be multifaceted in the setting of chronic medical illness.   We discussed that an NGT is not a long term solution, ideally it is used for short period of time. I asked Wanda [name] if she had every thought about a gastrostomy tube if she would not be able to prove herself the nourishment she needs. Her daughter, Wanda [name] spoke to her about this on the phone. Wanda [name] and her family are all in agreement that if she does not start eating they would want a G-Tube placed. I offered another alternative which would be enrollment in hospice transitioning our focus from cure to symptom relief. Wanda [name] got very upset during this time. He asked Wanda [name] if "she wanted to die." Wanda [name] stated  that she did not and would like to start eating again, but does not have a great appetite in general.   Per Wanda [name]'s physical assessment she has notable temporal wasting she has had a weight loss in the last two years of roughly 65lbs. Her prealbumin is 8.5 and albumin is 1.2. This information leads me to believe that she has not been eating well for quite sometime. Of note she did have a gastric sleeve surgery in 2016. She has ill fitting dentures for the last year which has also resulted in difficulty with mastication.   I shared this information as I am concerned that her wound healing will be greatly compromised as will recovery moving forward. Discussed how muscle mass can quickly deteriorate without appropriate nutrition and mobility.   I broached the topic of code status as yesterday Wanda [name] shared that she would not want life prolonging measures like CPR and ventilatory support. Her family stated that she did not understand the question and they would like everything done to keep her alive. I asked Wanda [name] if this is what she wants and she nodded yes.   Discussed the importance of continued conversation with family and their  medical providers regarding overall plan of care and treatment options, ensuring decisions are within the context of the patients values and GOCs.  Questions and concerns addressed   Patient is in a extremely tenuous state and I worry that she is high risk for acute medical decline. We will remains involved to continue ongoing conversations with Wanda [name]'s family throughout the course of the  week.   Decision Maker: Wanda [name] (Husband) [phone]  SUMMARY OF RECOMMENDATIONS Full code / Full scope    Family would be agreeable to a long term G-tube for supplemental nutrition, I worry that even with  nutrition optimization this would not be a long term solution for her as she still has significant physical debility. Patients husband will likely be unable to  care for her at home in her current state  Spiritual Support  Ongoing PMT [place] conversations  Code Status/Advance Care Planning: FULL CODE  Symptom Management: Failure to Thrive:                 - Dietician involved                 - On Coretrack feedings - Family would be interested a G-Tube                 - Supplemental nutrients  Pain, generalized:                 - Tyelnol 646m Q6H PRN                 - Oxycodone 563mPO Q4H PRN                 - Fentanyl 12.5-50mcg Q2H PRN - Please given instead of morphine given CKD                 - Turn Q2H  Muscular Deconditioning:                 - PT/OT  Time Spent: 70 Greater than 50% of the time was spent in counseling and coordination of care ______________________________________________________________________________________ [facility] Team Cell Phone: [phone] utilize secure chat with additional questions, if there is no response within 30 minutes please call the above phone number  Palliative Medicine Team providers are available by phone from 7am to 7pm daily and can be reached through the team cell phone.  Should this patient require assistance outside of these hours, please call the patient's attending physician.

## 2020-05-16 NOTE — Progress Notes (Signed)
[facility] Progress Note   58Fcomplicated  history  2, HTN ESRD s/p  2 kidney transplants, most recently at [facility] on 2/83/1517 complicated by DGF and dialysis dependence for approximately 2 months. Post transplant course has been complicated by recurrent UTIs, progressive debility/failure to thrive, pneumonia, history of gastric sleeve. [facility] 5/4 through 5/11 of this year with AKI, malnutrition + urosepsis  from Klebsiella and Enterococcus. D/C Cr ~3.0. Current transplant  treated w/  Tac  and prednisone. 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 It appears she also has been receiving infusions of belatacept. She was on prophylactic Bactrim and Valcyte. She presented  with progressive fever, chills, nausea. She was found to have a foul-smelling foot ulcer on the right side with drainage, leukocytosis. She was placed on ceftriaxone and vancomycin. She received MRI of the foot overnight with findings of cellulitis but no clear evidence of osteomyelitis. Her current MAR has tacrolimus of 3 mg twice daily, but I believe she takes 1.5 mg twice daily, I am not able to confirm this with her. She is also on prednisone.  Assessment/ Plan:   1. S/p renal transplant- Second transplant at [facility] in September of 2020- C/b delayed graft function. Allograft function is not great with crt of around 3 - no acute change with hospitalization. 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 Some controversy regarding prograf dose- put on what we think - Level high ish at 9- doseage taken down slightly and to recheck levelon 8/11. UOP fineon lasix - want to achieve diuresis- crt staying poor but stable   Currently on Lasix 80mg  IV q12hr  Had been on oral HCO3 but has since been stopped.   Lokelma ordered also started evening of 8/12 (BID); continue for now.   I am going to hold the Lasix for now and assess daily.  She may be on the dry side now.  Appreciate  dietician evaluating yesterday; she thought TF was running too long.   2. 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 No abx at this time 3. Anemia-fairly advanced, likely due to CKD- giving ESA- no iron due to infection- transfuse PRN 4. HTN/volume- hypotension, Proven to not be an issue once a line placed. All parameters in place to raise BP have been stopped. Will continue lasix at same dose  5. FTT-Therapies in place, Albumin of 1.4 gives poor prognosis - on prosource - Now cortrak placed  6. Metabolic acidosis- oral bicarb - off now -> will start low dose 1 tab BID. 7. PAF  Subjective:   Denies dyspnea, cough, nausea.    Objective:   BP (!) 144/45 (BP Location: Other (Comment)) Comment (BP Location): A-line  Pulse (!) 105   Temp 99 F (37.2 C) (Oral)   Resp 20   Ht 5\' 9"  (1.753 m)   Wt 48.5 kg   SpO2 96%   BMI 15.80 kg/m   Intake/Output Summary (Last 24 hours) at 05/16/2020 1111 Last data filed at 05/16/2020 6160 Gross per 24 hour  Intake 844.4 ml  Output --  Net 844.4 ml   Weight change: 20.9 kg  Physical Exam: General: Alert  Heart: RRR Lungs: mostly clear Abdomen: soft, non tender Extremities: pitting edema to dep areas - bilat boots and bandages--- has right upper arm AVF that is patent   Imaging: DG Chest 2 View  Result Date: 05/15/2020 CLINICAL DATA:  59 year old female  with fever. EXAM: CHEST - 2 VIEW COMPARISON:  Chest radiograph dated 05/01/2020 FINDINGS: Feeding tube extends below the diaphragm with tip beyond the inferior margin of the image. Shallow inspiration. Diffuse bilateral interstitial prominence and hazy airspace density most likely represent edema and small bilateral pleural effusions. Pneumonia is not excluded. Clinical correlation is recommended. There is no pneumothorax. The cardiac silhouette is within limits. No acute osseous pathology. IMPRESSION: Findings most  likely represent edema and small bilateral pleural effusions. Pneumonia is not excluded. Electronically Signed   By: Anner Crete M.D.   On: 05/15/2020 20:13    Labs: BMET Recent Labs  Lab 05/10/20 0532 05/10/20 0532 05/11/20 0410 05/11/20 0410 05/12/20 9983 05/13/20 3825 05/14/20 0421 05/15/20 0250 05/15/20 2237 05/16/20 0500 05/16/20 0726  NA 138  --  141  --  138 137 137 135  --  134*  --   K 4.9   < > 4.8   < > 5.0 5.4* 5.7* 5.6* 5.4* 4.9 5.2*  CL 109  --  111  --  106 107 105 103  --  101  --   CO2 15*  --  19*  --  19* 20* 21* 22  --  22  --   GLUCOSE 295*  --  238*  --  190* 163* 202* 220*  --  124*  --   BUN 56*  --  57*  --  58* 62* 69* 74*  --  82*  --   CREATININE 2.94*  --  2.88*  --  2.84* 2.64* 2.70* 2.70*  --  2.54*  --   CALCIUM 7.8*  --  7.9*  --  8.0* 8.1* 8.2* 8.2*  --  8.4*  --   PHOS 2.7  --  2.5  --  2.5 2.6 2.8 2.7  --  3.6  --    < > = values in this interval not displayed.   CBC Recent Labs  Lab 05/13/20 0436 05/14/20 0421 05/15/20 0250 05/16/20 0500  WBC 8.7 9.8 7.5 7.7  NEUTROABS 6.3 7.7 5.7 6.2  HGB 8.6* 8.9* 8.1* 7.7*  HCT 26.8* 28.2* 25.7* 24.7*  MCV 95.7 94.6 95.9 97.2  PLT 192 228 210 223    Medications:    . sodium chloride   Intravenous Once  . sodium chloride   Intravenous Once  . amiodarone  200 mg Oral Daily  . apixaban  5 mg Oral BID  . Chlorhexidine Gluconate Cloth  6 each Topical Daily  . darbepoetin (ARANESP) injection - NON-DIALYSIS  150 mcg Subcutaneous Q Mon-1800  . feeding supplement (ENSURE ENLIVE)  237 mL Oral TID BM  . feeding supplement (NEPRO CARB STEADY)  910 mL Per Tube Q24H  . feeding supplement (PROSource TF)  90 mL Per Tube BID  . folic acid  1 mg Oral Daily  . furosemide  80 mg Intravenous Q12H  . insulin aspart  0-15 Units Subcutaneous TID WC  . insulin aspart  0-5 Units Subcutaneous QHS  . insulin aspart  4 Units Subcutaneous Q4H  . lidocaine  5 mL Intradermal Once  . multivitamin with minerals   1 tablet Oral Daily  . nortriptyline  25 mg Oral QHS  . pantoprazole  40 mg Oral Daily  . predniSONE  5 mg Oral Q breakfast  . senna-docusate  1 tablet Oral Daily  . sodium zirconium cyclosilicate  10 g Oral BID  . tacrolimus  1 mg Oral BID  . thiamine  100 mg Oral  Daily  . vitamin C  250 mg Oral BID  . zinc sulfate  220 mg Oral Daily      Otelia Santee, MD 05/16/2020, 11:11 AM

## 2020-05-16 NOTE — Progress Notes (Signed)
Mobility Specialist: Progress Note   05/16/20 1436  Mobility  Activity  (Bed Exercises)  Level of Assistance Independent  Assistive Device None  Mobility Response Tolerated fair  Mobility performed by Mobility specialist  Bed Position Semi-fowlers  $Mobility charge 1 Mobility   During Mobility: 105 HR Post-Mobility: 100 HR, 140/48 BP  Pt performed UE and LE bed exercises. Pt performed 2 sets of 10 reps on each arm of UE exercises and 1 set of 5 reps on each leg of LE exercises.   [facility] Brianah Hopson Mobility Specialist

## 2020-05-17 ENCOUNTER — Inpatient Hospital Stay (HOSPITAL_COMMUNITY): Payer: Medicare HMO

## 2020-05-17 DIAGNOSIS — R509 Fever, unspecified: Secondary | ICD-10-CM

## 2020-05-17 LAB — CBC WITH DIFFERENTIAL/PLATELET
Abs Immature Granulocytes: 0.1 10*3/uL — ABNORMAL HIGH (ref 0.00–0.07)
Basophils Absolute: 0 10*3/uL (ref 0.0–0.1)
Basophils Relative: 0 %
Eosinophils Absolute: 0.1 10*3/uL (ref 0.0–0.5)
Eosinophils Relative: 1 %
HCT: 25 % — ABNORMAL LOW (ref 36.0–46.0)
Hemoglobin: 7.9 g/dL — ABNORMAL LOW (ref 12.0–15.0)
Immature Granulocytes: 1 %
Lymphocytes Relative: 19 %
Lymphs Abs: 1.5 10*3/uL (ref 0.7–4.0)
MCH: 31 pg (ref 26.0–34.0)
MCHC: 31.6 g/dL (ref 30.0–36.0)
MCV: 98 fL (ref 80.0–100.0)
Monocytes Absolute: 0.3 10*3/uL (ref 0.1–1.0)
Monocytes Relative: 4 %
Neutro Abs: 5.8 10*3/uL (ref 1.7–7.7)
Neutrophils Relative %: 75 %
Platelets: 275 10*3/uL (ref 150–400)
RBC: 2.55 MIL/uL — ABNORMAL LOW (ref 3.87–5.11)
RDW: 19.1 % — ABNORMAL HIGH (ref 11.5–15.5)
WBC: 7.8 10*3/uL (ref 4.0–10.5)
nRBC: 0 % (ref 0.0–0.2)

## 2020-05-17 LAB — PROCALCITONIN: Procalcitonin: 4.19 ng/mL

## 2020-05-17 LAB — RENAL FUNCTION PANEL
Albumin: 1.2 g/dL — ABNORMAL LOW (ref 3.5–5.0)
Anion gap: 11 (ref 5–15)
BUN: 99 mg/dL — ABNORMAL HIGH (ref 6–20)
CO2: 22 mmol/L (ref 22–32)
Calcium: 8.6 mg/dL — ABNORMAL LOW (ref 8.9–10.3)
Chloride: 102 mmol/L (ref 98–111)
Creatinine, Ser: 2.55 mg/dL — ABNORMAL HIGH (ref 0.44–1.00)
GFR calc Af Amer: 23 mL/min — ABNORMAL LOW (ref >60)
GFR calc non Af Amer: 20 mL/min — ABNORMAL LOW (ref >60)
Glucose, Bld: 167 mg/dL — ABNORMAL HIGH (ref 70–99)
Phosphorus: 4.2 mg/dL (ref 2.5–4.6)
Potassium: 4.8 mmol/L (ref 3.5–5.1)
Sodium: 135 mmol/L (ref 135–145)

## 2020-05-17 LAB — GLUCOSE, CAPILLARY
Glucose-Capillary: 144 mg/dL — ABNORMAL HIGH (ref 70–99)
Glucose-Capillary: 147 mg/dL — ABNORMAL HIGH (ref 70–99)
Glucose-Capillary: 145 mg/dL — ABNORMAL HIGH (ref 70–99)
Glucose-Capillary: 126 mg/dL — ABNORMAL HIGH (ref 70–99)
Glucose-Capillary: 96 mg/dL (ref 70–99)
Glucose-Capillary: 204 mg/dL — ABNORMAL HIGH (ref 70–99)
Glucose-Capillary: 199 mg/dL — ABNORMAL HIGH (ref 70–99)

## 2020-05-17 LAB — URINALYSIS, ROUTINE W REFLEX MICROSCOPIC
Bilirubin Urine: NEGATIVE
Glucose, UA: 50 mg/dL — AB
Ketones, ur: NEGATIVE mg/dL
Nitrite: NEGATIVE
Protein, ur: 30 mg/dL — AB
Specific Gravity, Urine: 1.012 (ref 1.005–1.030)
WBC, UA: >50 WBC/hpf — ABNORMAL HIGH (ref 0–5)
pH: 7 (ref 5.0–8.0)

## 2020-05-17 LAB — C-REACTIVE PROTEIN: CRP: 13.3 mg/dL — ABNORMAL HIGH (ref <1.0)

## 2020-05-17 LAB — MAGNESIUM: Magnesium: 2.2 mg/dL (ref 1.7–2.4)

## 2020-05-17 MED ORDER — NAPHAZOLINE-GLYCERIN 0.012-0.2 % OP SOLN
1.0000 [drp] | Freq: Four times a day (QID) | OPHTHALMIC | Status: DC | PRN
  Administered 2020-05-17: 2 [drp] via OPHTHALMIC
  Filled 2020-05-17: qty 15

## 2020-05-17 NOTE — Progress Notes (Signed)
Mobility Specialist: Progress Note    05/17/20 1421  Mobility  Activity  (Bed Exercises)  Level of Assistance Minimal assist, patient does 75% or more  Assistive Device None  Mobility Response Tolerated well  Mobility performed by Mobility specialist  Bed Position Semi-fowlers  $Mobility charge 1 Mobility   Pre-Mobility: 108 HR, 143/48 BP, 94% SpO2 Post-Mobility: 111 HR, 140/45 BP, 96% SpO2  Performed bed exercises w/ pt, 2 sets each for her UE and LE.   Jett Kulzer Mobility Specialist\

## 2020-05-17 NOTE — H&P (View-Only) (Signed)
[facility] for Infectious Disease       Reason for Consult: fever    Referring Physician: Dr. Karleen Hampshire  Principal Problem:   Septic shock [facility]) Active Problems:   DM (diabetes mellitus), type 2 with renal complications ([place])   Essential hypertension   CHRONIC KIDNEY DISEASE STAGE IV ([place])   Renal transplant disorder   Depression   History of kidney transplant   Wound infection   Leukocytosis   Pressure injury of skin   Palliative care by specialist   Goals of care, counseling/discussion   DNR (do not resuscitate) discussion   Failure to thrive in adult   . sodium chloride   Intravenous Once  . sodium chloride   Intravenous Once  . amiodarone  200 mg Oral Daily  . apixaban  5 mg Oral BID  . Chlorhexidine Gluconate Cloth  6 each Topical Daily  . darbepoetin (ARANESP) injection - NON-DIALYSIS  150 mcg Subcutaneous Q Mon-1800  . feeding supplement (ENSURE ENLIVE)  237 mL Oral TID BM  . feeding supplement (NEPRO CARB STEADY)  910 mL Per Tube Q24H  . feeding supplement (PROSource TF)  90 mL Per Tube BID  . folic acid  1 mg Oral Daily  . insulin aspart  0-15 Units Subcutaneous TID WC  . insulin aspart  0-5 Units Subcutaneous QHS  . insulin aspart  4 Units Subcutaneous Q4H  . lidocaine  5 mL Intradermal Once  . multivitamin with minerals  1 tablet Oral Daily  . nortriptyline  25 mg Oral QHS  . pantoprazole  40 mg Oral Daily  . predniSONE  5 mg Oral Q breakfast  . senna-docusate  1 tablet Oral Daily  . sodium bicarbonate  650 mg Oral BID  . sodium zirconium cyclosilicate  10 g Oral BID  . tacrolimus  1 mg Oral BID  . thiamine  100 mg Oral Daily  . vitamin C  250 mg Oral BID  . zinc sulfate  220 mg Oral Daily    Recommendations: Continue cefepime  [place] based on sensitivities  Dr. Baxter Flattery will follow up tomorrow  Assessment: She has fever and bacteremia with Klebsiella positive in 4/4 bottles c/w infection.  Fever curve trending down.  Now day 2 of  treatment.    Antibiotics: cefepime  HPI: Wanda [name] is a 59 y.o. female with ESRD s/p transplant x 2 with last one in 2020 came in with a draining foot wound at the end of July.  Underwent amputation and completed antibiotics but has remained inpatient.  2 days ago she developed a fever and blood cultures positive for Klebsiella aerogenes, sensitivities pending.  She has no dysuria, no pyuria, no abdominal complaints.  Fever to 103, has trended down.  WBC wnl. She has not noted any chills.     Review of Systems:  Constitutional: negative for fevers, chills, fatigue and malaise Respiratory: negative for cough or sputum Gastrointestinal: negative for nausea and diarrhea Integument/breast: negative for rash Hematologic/lymphatic: negative for lymphadenopathy All other systems reviewed and are negative    Past Medical History:  Diagnosis Date  . Anemia   . Arthritis    "hands" (07/24/2013) pt denies  . Chronic kidney disease, stage IV (severe) ([place])    "never went on dialysis" (07/24/2013)  . Diabetes mellitus type II   . Diabetic peripheral neuropathy ([place])   . Dialysis patient [facility])    Monday Wednesday Friday   . Dyspnea on exertion    pt denies  .  Foot pain   . GERD (gastroesophageal reflux disease)   . Gout   . History of chicken pox   . History of measles   . History of mumps   . Hyperlipidemia   . Hypothyroidism   . Migraines    "q 3 months" (07/24/2013)  . Nonspecific abnormal unspecified cardiovascular function study   . Obesity   . PAD (peripheral artery disease) ([place])   . Peripheral neuropathy   . Peripheral vascular disease ([place])   . Pneumonia 09/2012  . Renal transplant disorder   . Shingles   . Sleep apnea    "getting ready to get tested again cause dr says I have it" (07/24/2013) pt states was tested and was told she did not have sleep apnea / no cpap  . Unspecified essential hypertension     Social History   Tobacco Use  . Smoking status:  Never Smoker  . Smokeless tobacco: Never Used  Vaping Use  . Vaping Use: Never used  Substance Use Topics  . Alcohol use: No  . Drug use: No    Family History  Problem Relation Age of Onset  . Colon cancer Father        deceased at age 67  . Diabetes Brother   . Neuropathy Neg Hx     Allergies  Allergen Reactions  . Propoxyphene N-Acetaminophen Other (See Comments)    Severe headache  . Hydrocodone-Acetaminophen Other (See Comments)     Severe headache   . Astemizole Rash and Other (See Comments)    headache   . Cephalosporins Rash    Physical Exam: Constitutional: in no apparent distress  Vitals:   05/17/20 0820 05/17/20 1100  BP: 99/74 (!) 145/47  Pulse: (!) 118 (!) 119  Resp: 18 19  Temp: (!) 101 F (38.3 C) 99.3 F (37.4 C)  SpO2: 91% 99%   EYES: anicteric Cardiovascular: Cor RRR Respiratory: clear; GI: Bowel sounds are normal, liver is not enlarged, spleen is not enlarged Musculoskeletal: s/p amputation right BKA Skin: negatives: no rash Neuro: non-focal  Lab Results  Component Value Date   WBC 7.8 05/17/2020   HGB 7.9 (L) 05/17/2020   HCT 25.0 (L) 05/17/2020   MCV 98.0 05/17/2020   PLT 275 05/17/2020    Lab Results  Component Value Date   CREATININE 2.55 (H) 05/17/2020   BUN 99 (H) 05/17/2020   NA 135 05/17/2020   K 4.8 05/17/2020   CL 102 05/17/2020   CO2 22 05/17/2020    Lab Results  Component Value Date   ALT 10 05/02/2020   AST 21 05/02/2020   ALKPHOS 301 (H) 05/02/2020     Microbiology: Recent Results (from the past 240 hour(s))  Culture, blood (Routine X 2) w Reflex to ID Panel     Status: Abnormal (Preliminary result)   Collection Time: 05/15/20 10:21 PM   Specimen: BLOOD LEFT HAND  Result Value Ref Range Status   Specimen Description BLOOD LEFT HAND  Final   Special Requests   Final    BOTTLES DRAWN AEROBIC AND ANAEROBIC Blood Culture adequate volume   Culture  Setup Time   Final    GRAM NEGATIVE RODS IN BOTH AEROBIC  AND ANAEROBIC BOTTLES CRITICAL RESULT CALLED TO, READ BACK BY AND VERIFIED WITH: C. KO PHARMD, AT 1333 05/16/20 BY D. VANHOOK    Culture (A)  Final    ENTEROBACTER AEROGENES SUSCEPTIBILITIES TO FOLLOW Performed at [facility], [address], [place], [place] [address]  Report Status PENDING  Incomplete  Blood Culture ID Panel (Reflexed)     Status: Abnormal   Collection Time: 05/15/20 10:21 PM  Result Value Ref Range Status   Enterococcus faecalis NOT DETECTED NOT DETECTED Final   Enterococcus Faecium NOT DETECTED NOT DETECTED Final   Listeria monocytogenes NOT DETECTED NOT DETECTED Final   Staphylococcus species NOT DETECTED NOT DETECTED Final   Staphylococcus aureus (BCID) NOT DETECTED NOT DETECTED Final   Staphylococcus epidermidis NOT DETECTED NOT DETECTED Final   Staphylococcus lugdunensis NOT DETECTED NOT DETECTED Final   Streptococcus species NOT DETECTED NOT DETECTED Final   Streptococcus agalactiae NOT DETECTED NOT DETECTED Final   Streptococcus pneumoniae NOT DETECTED NOT DETECTED Final   Streptococcus pyogenes NOT DETECTED NOT DETECTED Final   A.calcoaceticus-baumannii NOT DETECTED NOT DETECTED Final   Bacteroides fragilis NOT DETECTED NOT DETECTED Final   Enterobacterales DETECTED (A) NOT DETECTED Final    Comment: Enterobacterales represent a large order of gram negative bacteria, not a single organism. CRITICAL RESULT CALLED TO, READ BACK BY AND VERIFIED WITH: C. KO PHARMD, AT 1333 05/16/20 BY D. VANHOOK    Enterobacter cloacae complex NOT DETECTED NOT DETECTED Final   Escherichia coli NOT DETECTED NOT DETECTED Final   Klebsiella aerogenes DETECTED (A) NOT DETECTED Final    Comment: CRITICAL RESULT CALLED TO, READ BACK BY AND VERIFIED WITH: C. KO PHARMD, AT 1333 05/16/20 BY D. VANHOOK    Klebsiella oxytoca NOT DETECTED NOT DETECTED Final   Klebsiella pneumoniae NOT DETECTED NOT DETECTED Final   Proteus species NOT DETECTED NOT DETECTED Final   Salmonella  species NOT DETECTED NOT DETECTED Final   Serratia marcescens NOT DETECTED NOT DETECTED Final   Haemophilus influenzae NOT DETECTED NOT DETECTED Final   Neisseria meningitidis NOT DETECTED NOT DETECTED Final   Pseudomonas aeruginosa NOT DETECTED NOT DETECTED Final   Stenotrophomonas maltophilia NOT DETECTED NOT DETECTED Final   Candida albicans NOT DETECTED NOT DETECTED Final   Candida auris NOT DETECTED NOT DETECTED Final   Candida glabrata NOT DETECTED NOT DETECTED Final   Candida krusei NOT DETECTED NOT DETECTED Final   Candida parapsilosis NOT DETECTED NOT DETECTED Final   Candida tropicalis NOT DETECTED NOT DETECTED Final   Cryptococcus neoformans/gattii NOT DETECTED NOT DETECTED Final   CTX-M ESBL NOT DETECTED NOT DETECTED Final   Carbapenem resistance IMP NOT DETECTED NOT DETECTED Final   Carbapenem resistance KPC NOT DETECTED NOT DETECTED Final   Carbapenem resistance NDM NOT DETECTED NOT DETECTED Final   Carbapenem resist OXA 48 LIKE NOT DETECTED NOT DETECTED Final   Carbapenem resistance VIM NOT DETECTED NOT DETECTED Final    Comment: Performed at [facility], [address] [place], [place] [address]  Culture, blood (Routine X 2) w Reflex to ID Panel     Status: None (Preliminary result)   Collection Time: 05/15/20 10:35 PM   Specimen: BLOOD LEFT ARM  Result Value Ref Range Status   Specimen Description BLOOD LEFT ARM  Final   Special Requests   Final    BOTTLES DRAWN AEROBIC AND ANAEROBIC Blood Culture adequate volume   Culture  Setup Time   Final    GRAM NEGATIVE RODS IN BOTH AEROBIC AND ANAEROBIC BOTTLES CRITICAL VALUE NOTED.  VALUE IS CONSISTENT WITH PREVIOUSLY REPORTED AND CALLED VALUE. Performed at [facility], [place] [address], [place], [place] [address]    Culture GRAM NEGATIVE RODS  Final   Report Status PENDING  Incomplete    Herbie Baltimore  Ethel Rana, [place] for Infectious Disease [email or web address] 05/17/2020,  1:59 PM

## 2020-05-17 NOTE — Consult Note (Signed)
[place] for Infectious Disease       Reason for Consult: fever    Referring Physician: Dr. Karleen Hampshire  Principal Problem:   Septic shock [name] Active Problems:   DM (diabetes mellitus), type 2 with renal complications ([place])   Essential hypertension   CHRONIC KIDNEY DISEASE STAGE IV (SEVERE)   Renal transplant disorder   Depression   History of kidney transplant   Wound infection   Leukocytosis   Pressure injury of skin   Palliative care by specialist   Goals of care, counseling/discussion   DNR (do not resuscitate) discussion   Failure to thrive in adult   . sodium chloride   Intravenous Once  . sodium chloride   Intravenous Once  . amiodarone  200 mg Oral Daily  . apixaban  5 mg Oral BID  . Chlorhexidine Gluconate Cloth  6 each Topical Daily  . darbepoetin (ARANESP) injection - NON-DIALYSIS  150 mcg Subcutaneous Q Mon-1800  . feeding supplement (ENSURE ENLIVE)  237 mL Oral TID BM  . feeding supplement (NEPRO CARB STEADY)  910 mL Per Tube Q24H  . feeding supplement (PROSource TF)  90 mL Per Tube BID  . folic acid  1 mg Oral Daily  . insulin aspart  0-15 Units Subcutaneous TID WC  . insulin aspart  0-5 Units Subcutaneous QHS  . insulin aspart  4 Units Subcutaneous Q4H  . lidocaine  5 mL Intradermal Once  . multivitamin with minerals  1 tablet Oral Daily  . nortriptyline  25 mg Oral QHS  . pantoprazole  40 mg Oral Daily  . predniSONE  5 mg Oral Q breakfast  . senna-docusate  1 tablet Oral Daily  . sodium bicarbonate  650 mg Oral BID  . sodium zirconium cyclosilicate  10 g Oral BID  . tacrolimus  1 mg Oral BID  . thiamine  100 mg Oral Daily  . vitamin C  250 mg Oral BID  . zinc sulfate  220 mg Oral Daily    Recommendations: Continue cefepime  [place] based on sensitivities  Dr. Baxter Flattery will follow up tomorrow  Assessment: She has fever and bacteremia with Klebsiella positive in 4/4 bottles c/w infection.  Fever curve trending down.  Now day 2 of  treatment.    Antibiotics: cefepime  HPI: Wanda [name] is a 59 y.o. female with ESRD s/p transplant x 2 with last one in 2020 came in with a draining foot wound at the end of July.  Underwent amputation and completed antibiotics but has remained inpatient.  2 days ago she developed a fever and blood cultures positive for Klebsiella aerogenes, sensitivities pending.  She has no dysuria, no pyuria, no abdominal complaints.  Fever to 103, has trended down.  WBC wnl. She has not noted any chills.     Review of Systems:  Constitutional: negative for fevers, chills, fatigue and malaise Respiratory: negative for cough or sputum Gastrointestinal: negative for nausea and diarrhea Integument/breast: negative for rash Hematologic/lymphatic: negative for lymphadenopathy All other systems reviewed and are negative    Past Medical History:  Diagnosis Date  . Anemia   . Arthritis    "hands" (07/24/2013) pt denies  . Chronic kidney disease, stage IV (severe) ([place])    "never went on dialysis" (07/24/2013)  . Diabetes mellitus type II   . Diabetic peripheral neuropathy ([place])   . Dialysis patient [facility])    Monday Wednesday Friday   . Dyspnea on exertion    pt denies  .  Foot pain   . GERD (gastroesophageal reflux disease)   . Gout   . History of chicken pox   . History of measles   . History of mumps   . Hyperlipidemia   . Hypothyroidism   . Migraines    "q 3 months" (07/24/2013)  . Nonspecific abnormal unspecified cardiovascular function study   . Obesity   . PAD (peripheral artery disease) ([place])   . Peripheral neuropathy   . Peripheral vascular disease ([place])   . Pneumonia 09/2012  . Renal transplant disorder   . Shingles   . Sleep apnea    "getting ready to get tested again cause dr says I have it" (07/24/2013) pt states was tested and was told she did not have sleep apnea / no cpap  . Unspecified essential hypertension     Social History   Tobacco Use  . Smoking status:  Never Smoker  . Smokeless tobacco: Never Used  Vaping Use  . Vaping Use: Never used  Substance Use Topics  . Alcohol use: No  . Drug use: No    Family History  Problem Relation Age of Onset  . Colon cancer Father        deceased at age 53  . Diabetes Brother   . Neuropathy Neg Hx     Allergies  Allergen Reactions  . Propoxyphene N-Acetaminophen Other (See Comments)    Severe headache  . Hydrocodone-Acetaminophen Other (See Comments)     Severe headache   . Astemizole Rash and Other (See Comments)    headache   . Cephalosporins Rash    Physical Exam: Constitutional: in no apparent distress  Vitals:   05/17/20 0820 05/17/20 1100  BP: 99/74 (!) 145/47  Pulse: (!) 118 (!) 119  Resp: 18 19  Temp: (!) 101 F (38.3 C) 99.3 F (37.4 C)  SpO2: 91% 99%   EYES: anicteric Cardiovascular: Cor RRR Respiratory: clear; GI: Bowel sounds are normal, liver is not enlarged, spleen is not enlarged Musculoskeletal: s/p amputation right BKA Skin: negatives: no rash Neuro: non-focal  Lab Results  Component Value Date   WBC 7.8 05/17/2020   HGB 7.9 (L) 05/17/2020   HCT 25.0 (L) 05/17/2020   MCV 98.0 05/17/2020   PLT 275 05/17/2020    Lab Results  Component Value Date   CREATININE 2.55 (H) 05/17/2020   BUN 99 (H) 05/17/2020   NA 135 05/17/2020   K 4.8 05/17/2020   CL 102 05/17/2020   CO2 22 05/17/2020    Lab Results  Component Value Date   ALT 10 05/02/2020   AST 21 05/02/2020   ALKPHOS 301 (H) 05/02/2020     Microbiology: Recent Results (from the past 240 hour(s))  Culture, blood (Routine X 2) w Reflex to ID Panel     Status: Abnormal (Preliminary result)   Collection Time: 05/15/20 10:21 PM   Specimen: BLOOD LEFT HAND  Result Value Ref Range Status   Specimen Description BLOOD LEFT HAND  Final   Special Requests   Final    BOTTLES DRAWN AEROBIC AND ANAEROBIC Blood Culture adequate volume   Culture  Setup Time   Final    GRAM NEGATIVE RODS IN BOTH AEROBIC  AND ANAEROBIC BOTTLES CRITICAL RESULT CALLED TO, READ BACK BY AND VERIFIED WITH: C. KO PHARMD, AT 1333 05/16/20 BY D. VANHOOK    Culture (A)  Final    ENTEROBACTER AEROGENES SUSCEPTIBILITIES TO FOLLOW Performed at [facility], [address] [place], [place] [address]  Report Status PENDING  Incomplete  Blood Culture ID Panel (Reflexed)     Status: Abnormal   Collection Time: 05/15/20 10:21 PM  Result Value Ref Range Status   Enterococcus faecalis NOT DETECTED NOT DETECTED Final   Enterococcus Faecium NOT DETECTED NOT DETECTED Final   Listeria monocytogenes NOT DETECTED NOT DETECTED Final   Staphylococcus species NOT DETECTED NOT DETECTED Final   Staphylococcus aureus (BCID) NOT DETECTED NOT DETECTED Final   Staphylococcus epidermidis NOT DETECTED NOT DETECTED Final   Staphylococcus lugdunensis NOT DETECTED NOT DETECTED Final   Streptococcus species NOT DETECTED NOT DETECTED Final   Streptococcus agalactiae NOT DETECTED NOT DETECTED Final   Streptococcus pneumoniae NOT DETECTED NOT DETECTED Final   Streptococcus pyogenes NOT DETECTED NOT DETECTED Final   A.calcoaceticus-baumannii NOT DETECTED NOT DETECTED Final   Bacteroides fragilis NOT DETECTED NOT DETECTED Final   Enterobacterales DETECTED (A) NOT DETECTED Final    Comment: Enterobacterales represent a large order of gram negative bacteria, not a single organism. CRITICAL RESULT CALLED TO, READ BACK BY AND VERIFIED WITH: C. KO PHARMD, AT 1333 05/16/20 BY D. VANHOOK    Enterobacter cloacae complex NOT DETECTED NOT DETECTED Final   Escherichia coli NOT DETECTED NOT DETECTED Final   Klebsiella aerogenes DETECTED (A) NOT DETECTED Final    Comment: CRITICAL RESULT CALLED TO, READ BACK BY AND VERIFIED WITH: C. KO PHARMD, AT 1333 05/16/20 BY D. VANHOOK    Klebsiella oxytoca NOT DETECTED NOT DETECTED Final   Klebsiella pneumoniae NOT DETECTED NOT DETECTED Final   Proteus species NOT DETECTED NOT DETECTED Final   Salmonella  species NOT DETECTED NOT DETECTED Final   Serratia marcescens NOT DETECTED NOT DETECTED Final   Haemophilus influenzae NOT DETECTED NOT DETECTED Final   Neisseria meningitidis NOT DETECTED NOT DETECTED Final   Pseudomonas aeruginosa NOT DETECTED NOT DETECTED Final   Stenotrophomonas maltophilia NOT DETECTED NOT DETECTED Final   Candida albicans NOT DETECTED NOT DETECTED Final   Candida auris NOT DETECTED NOT DETECTED Final   Candida glabrata NOT DETECTED NOT DETECTED Final   Candida krusei NOT DETECTED NOT DETECTED Final   Candida parapsilosis NOT DETECTED NOT DETECTED Final   Candida tropicalis NOT DETECTED NOT DETECTED Final   Cryptococcus neoformans/gattii NOT DETECTED NOT DETECTED Final   CTX-M ESBL NOT DETECTED NOT DETECTED Final   Carbapenem resistance IMP NOT DETECTED NOT DETECTED Final   Carbapenem resistance KPC NOT DETECTED NOT DETECTED Final   Carbapenem resistance NDM NOT DETECTED NOT DETECTED Final   Carbapenem resist OXA 48 LIKE NOT DETECTED NOT DETECTED Final   Carbapenem resistance VIM NOT DETECTED NOT DETECTED Final    Comment: Performed at [facility], [address] [place], [place] [address]  Culture, blood (Routine X 2) w Reflex to ID Panel     Status: None (Preliminary result)   Collection Time: 05/15/20 10:35 PM   Specimen: BLOOD LEFT ARM  Result Value Ref Range Status   Specimen Description BLOOD LEFT ARM  Final   Special Requests   Final    BOTTLES DRAWN AEROBIC AND ANAEROBIC Blood Culture adequate volume   Culture  Setup Time   Final    GRAM NEGATIVE RODS IN BOTH AEROBIC AND ANAEROBIC BOTTLES CRITICAL VALUE NOTED.  VALUE IS CONSISTENT WITH PREVIOUSLY REPORTED AND CALLED VALUE. Performed at [facility], [place] [address] [place], [place] [address]    Culture GRAM NEGATIVE RODS  Final   Report Status PENDING  Incomplete    Herbie Baltimore  Ethel Rana, [place] for Infectious Disease [name] 05/17/2020,  1:59 PM

## 2020-05-17 NOTE — Progress Notes (Signed)
Palliative Medicine Inpatient Follow Up Note  Reason for consult:  Goals of Care  HPI:  Per intake H&P --> 59 year old lady with prior history of diabetes, hypertension, paroxysmal atrial fibrillation on Eliquis, hyperlipidemia, end-stage renal disease s/p failed kidney transplant in 2013 and repeat renal transplant in September 2020, stage IV CKD, anemia of chronic disease, history of gastric sleeve surgery, hypoalbuminemia, right foot ulcer, chronic neuropathy presents with right foot ulcer, fevers. X-rays of the foot did not show any evidence of osteomyelitis she was started on IV antibiotics and fluids.  Palliative care was consulted for goals of care conversations in the setting of poor oral intake and muscular deconditioning.   Today's Discussion (05/17/2020): Chart reviewed. I met with Wanda [name] at bedside, I asked her if she remembered our conversation from yesterday and she shares that she did. I emphasized that I wanted to verify she understood what she had said. We again reviewed her code status, she states that she would want full resuscitative efforts made inclusive of chest compressions, intubation, transfer to the ICU. She expresses that she did eat well yesterday and is optimistic moving forward. She does state that she would want a G-tube if it were offered.   I spoke to Wanda [name] over the phone, we discussed the infection that Otila starting receiving treatment more and the involvement of ID. He also expresses that she ate better this morning.  IR had been consulted for G-Tube placement though this is complicated as patient has a prior gastric sleeve procedure.   Discussed the importance of continued conversation with family and their  medical providers regarding overall plan of care and treatment options, ensuring decisions are within the context of the patients values and GOCs.  Questions and concerns addressed   Decision Maker: Wanda [name] (Husband)  [phone]  SUMMARY OF RECOMMENDATIONS Full code / Full scope    Family would be agreeable to a long term G-tube for supplemental nutrition, though this may be complicated by her prior gastric sleeve procedure  Spiritual Support  Ongoing PMT Support  Code Status/Advance Care Planning: FULL CODE  Symptom Management: Failure to Thrive:                 - Dietician involved                 - On Coretrack feedings - Family would be interested a G-Tube                 - Supplemental nutrients  Pain, generalized:                 - Tyelnol '650mg'$  Q6H PRN                 - Oxycodone '5mg'$  PO Q4H PRN                 - Fentanyl 12.5-50mcg Q2H PRN - Please given instead of morphine given CKD                 - Turn Q2H  Muscular Deconditioning:                 - PT/OT  Time Spent: 25 Greater than 50% of the time was spent in counseling and coordination of care ______________________________________________________________________________________ [place] Team Team Cell Phone: [phone] Please utilize secure chat with additional questions, if there is no response within 30 minutes please call the above phone number  Palliative Medicine Team  providers are available by phone from 7am to 7pm daily and can be reached through the team cell phone.  Should this patient require assistance outside of these hours, please call the patient's attending physician.

## 2020-05-17 NOTE — Progress Notes (Signed)
[facility] Progress Note   58Fcomplicated history 2, HTNESRD s/p2 kidney transplants, most recently at [facility] on 5/36/6440 complicated by DGFand dialysis dependence for approximately 2 months. Post transplant course has been complicated by recurrent UTIs, progressive debility/failure to thrive, pneumonia, history of gastric sleeve. [facility] 5/4 through 5/11 of this year with AKI, malnutrition + urosepsis  from Klebsiella and Enterococcus. D/C Cr ~3.0.Current transplant treated w/Tac and prednisone. 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 It appears she also has been receiving infusions of belatacept. She was on prophylactic Bactrim and Valcyte. She presented with progressive fever, chills, nausea. She was found to have a foul-smelling foot ulcer on the right side with drainage, leukocytosis. She was placed on ceftriaxone and vancomycin. She received MRI of the foot overnight with findings of cellulitis but no clear evidence of osteomyelitis. Her current MAR has tacrolimus of 3 mg twice daily, but I believe she takes 1.5 mg twice daily, I am not able to confirm this with her. She is also on prednisone.  Assessment/ Plan:   1. S/p renal transplant- Second transplant at [facility] in September of 2020- C/b delayed graft function. Allograft function is not great with crt of around 3 - no acute change with hospitalization. 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 Some controversy regarding prograf dose- put on what we think - Level high ish at 9- doseage taken down slightly and to recheck levelon 8/11. UOP fineon lasix - want to achieve diuresis- crt staying poor but stable  Lokelma ordered also started evening of 8/12(BID); continue for now.   Appreciate  dietician evaluating ; she thought TF was running too long.  I stopped  the Lasix 8/14; she is on the dry side now. Renal function  and K better now as well.  Will sign off at this time; please reconsult as needed.  2. 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 Anemia-fairly advanced, likely due to CKD- giving ESA- no iron due to infection- transfuse PRN 4. HTN/volume- hypotension, Proven to not be an issue once a line placed. All parameters in place to raise BP have been stopped. Will continue lasix at same dose  5. FTT-Therapies in place, Albumin of 1.4 gives poor prognosis - on prosource - Now cortrak placed  6. Metabolic acidosis- oral bicarb - off now -> will start low dose 1 tab BID. 7. PAF  Subjective:   Denies dyspnea, cough, nausea. Feels thirsty.    Objective:   BP 99/74 (BP Location: Left Leg)   Pulse (!) 118   Temp (!) 101 F (38.3 C) (Oral)   Resp 18   Ht 5\' 9"  (1.753 m)   Wt 66.7 kg   SpO2 91%   BMI 21.70 kg/m   Intake/Output Summary (Last 24 hours) at 05/17/2020 3474 Last data filed at 05/17/2020 2595 Gross per 24 hour  Intake 538.54 ml  Output --  Net 538.54 ml   Weight change: -2.268 kg  Physical Exam: General:Alert Heart: RRR Lungs: mostly clear Abdomen: soft, non tender Extremities: pitting edema to dep areas - bilat boots and bandages--- has right upper arm AVF that is patent  Imaging: CT ABDOMEN WO CONTRAST  Result Date: 05/17/2020 CLINICAL DATA:  Poor oral intake and malnutrition. Evaluation for possible gastrostomy tube placement. EXAM: CT ABDOMEN WITHOUT CONTRAST TECHNIQUE: Multidetector CT imaging of the abdomen was performed following the standard protocol without  IV contrast. COMPARISON:  02/04/2020 FINDINGS: Lower chest: Elevation of the left hemidiaphragm with small amount of left pleural fluid. Trace right pleural effusion with atelectasis at the posterior right lung base. Hepatobiliary: No focal liver abnormality is seen. Status post cholecystectomy. No biliary dilatation. Pancreas: Unremarkable. No pancreatic  ductal dilatation or surrounding inflammatory changes. Spleen: Normal in size without focal abnormality. Adrenals/Urinary Tract: Atrophic bilateral kidneys without evidence of hydronephrosis or focal lesions by unenhanced CT. No adrenal masses. Stomach/Bowel: Feeding tube present extending into the proximal duodenum. There is evidence of prior gastric surgery consistent with gastric sleeve procedure. No evidence of bowel obstruction, significant ileus or free intraperitoneal air. Vascular/Lymphatic: Aortic atherosclerosis without aneurysm. No enlarged abdominal lymph nodes. Other: No ascites or focal fluid collections. No abdominal hernias identified. Musculoskeletal: No acute or significant osseous findings. IMPRESSION: 1. No acute findings in the abdomen or pelvis. 2. Elevation of the left hemidiaphragm with small amount of left pleural fluid. 3. Trace right pleural effusion with atelectasis at the posterior right lung base. 4. Atrophic bilateral kidneys. 5. Aortic atherosclerosis without aneurysm. 6. Status post gastric sleeve procedure. For this reason the patient is not a candidate for percutaneous gastrostomy tube placement due to possible inadequate ability to insufflate the stomach with air after prior surgery as well as higher risk of complication after prior gastric sleeve. Aortic Atherosclerosis (ICD10-I70.0). Electronically Signed   By: Aletta Edouard M.D.   On: 05/17/2020 08:35   DG Chest 2 View  Result Date: 05/15/2020 CLINICAL DATA:  59 year old female with fever. EXAM: CHEST - 2 VIEW COMPARISON:  Chest radiograph dated 05/01/2020 FINDINGS: Feeding tube extends below the diaphragm with tip beyond the inferior margin of the image. Shallow inspiration. Diffuse bilateral interstitial prominence and hazy airspace density most likely represent edema and small bilateral pleural effusions. Pneumonia is not excluded. Clinical correlation is recommended. There is no pneumothorax. The cardiac silhouette is  within limits. No acute osseous pathology. IMPRESSION: Findings most likely represent edema and small bilateral pleural effusions. Pneumonia is not excluded. Electronically Signed   By: Anner Crete M.D.   On: 05/15/2020 20:13   DG Foot Complete Left  Result Date: 05/16/2020 CLINICAL DATA:  Left foot pain. EXAM: LEFT FOOT - COMPLETE 3+ VIEW COMPARISON:  05/01/2020 FINDINGS: Examination demonstrates no evidence of acute fracture or dislocation. Remaining bones and soft tissues otherwise unchanged. IMPRESSION: No acute findings. Electronically Signed   By: Marin Olp M.D.   On: 05/16/2020 16:52    Labs: BMET Recent Labs  Lab 05/11/20 0410 05/11/20 0410 05/12/20 0449 05/12/20 0449 05/13/20 0436 05/14/20 0421 05/15/20 0250 05/15/20 2237 05/16/20 0500 05/16/20 0726 05/17/20 0441  NA 141  --  138  --  137 137 135  --  134*  --  135  K 4.8   < > 5.0   < > 5.4* 5.7* 5.6* 5.4* 4.9 5.2* 4.8  CL 111  --  106  --  107 105 103  --  101  --  102  CO2 19*  --  19*  --  20* 21* 22  --  22  --  22  GLUCOSE 238*  --  190*  --  163* 202* 220*  --  124*  --  167*  BUN 57*  --  58*  --  62* 69* 74*  --  82*  --  99*  CREATININE 2.88*  --  2.84*  --  2.64* 2.70* 2.70*  --  2.54*  --  2.55*  CALCIUM 7.9*  --  8.0*  --  8.1* 8.2* 8.2*  --  8.4*  --  8.6*  PHOS 2.5  --  2.5  --  2.6 2.8 2.7  --  3.6  --  4.2   < > = values in this interval not displayed.   CBC Recent Labs  Lab 05/14/20 0421 05/15/20 0250 05/16/20 0500 05/17/20 0441  WBC 9.8 7.5 7.7 7.8  NEUTROABS 7.7 5.7 6.2 5.8  HGB 8.9* 8.1* 7.7* 7.9*  HCT 28.2* 25.7* 24.7* 25.0*  MCV 94.6 95.9 97.2 98.0  PLT 228 210 223 275    Medications:    . sodium chloride   Intravenous Once  . sodium chloride   Intravenous Once  . amiodarone  200 mg Oral Daily  . apixaban  5 mg Oral BID  . Chlorhexidine Gluconate Cloth  6 each Topical Daily  . darbepoetin (ARANESP) injection - NON-DIALYSIS  150 mcg Subcutaneous Q Mon-1800  . feeding  supplement (ENSURE ENLIVE)  237 mL Oral TID BM  . feeding supplement (NEPRO CARB STEADY)  910 mL Per Tube Q24H  . feeding supplement (PROSource TF)  90 mL Per Tube BID  . folic acid  1 mg Oral Daily  . insulin aspart  0-15 Units Subcutaneous TID WC  . insulin aspart  0-5 Units Subcutaneous QHS  . insulin aspart  4 Units Subcutaneous Q4H  . lidocaine  5 mL Intradermal Once  . multivitamin with minerals  1 tablet Oral Daily  . nortriptyline  25 mg Oral QHS  . pantoprazole  40 mg Oral Daily  . predniSONE  5 mg Oral Q breakfast  . senna-docusate  1 tablet Oral Daily  . sodium bicarbonate  650 mg Oral BID  . sodium zirconium cyclosilicate  10 g Oral BID  . tacrolimus  1 mg Oral BID  . thiamine  100 mg Oral Daily  . vitamin C  250 mg Oral BID  . zinc sulfate  220 mg Oral Daily      Otelia Santee, MD 05/17/2020, 9:27 AM

## 2020-05-17 NOTE — Progress Notes (Signed)
PROGRESS NOTE    Wanda [name]  YKZ:[id number] DOB: 1961/07/31 DOA: 05/01/2020 PCP: Lorene Dy, MD    Chief Complaint  Patient presents with  . Skin Ulcer  . Fatigue  . Fever    Brief Narrative:   59 year old lady with prior history of diabetes, hypertension, paroxysmal atrial fibrillation on Eliquis, hyperlipidemia, end-stage renal disease s/p failed kidney transplant in 2013 and repeat renal transplant in September 2020, stage IV CKD, anemia of chronic disease, history of gastric sleeve surgery, hypoalbuminemia, right foot ulcer, chronic neuropathy presents with right foot ulcer, fevers.  X-rays of the foot did not show any evidence of osteomyelitis she was started on IV antibiotics and fluids. Pt continues to remain on tube feeds. As per RN, pt is eating less than 25% orally. She appears very deconditioned. Palliative care consulted for goals of care. Hospital course is complicated by fever and klebsiella  Aerogenes bacteremia. She was started on cefepime. ID consulted for recommendations. Reported by RN that patient has pain and burning sensation in the right eye.  Assessment & Plan:   Principal Problem:   Septic shock ([place]) Active Problems:   DM (diabetes mellitus), type 2 with renal complications (HCC)   Essential hypertension   CHRONIC KIDNEY DISEASE STAGE IV (SEVERE)   Renal transplant disorder   Depression   History of kidney transplant   Wound infection   Leukocytosis   Pressure injury of skin   Palliative care by specialist   Goals of care, counseling/discussion   DNR (do not resuscitate) discussion   Failure to thrive in adult   Sepsis present on admission Right foot ulcer infected with surrounding cellulitis. Pt completed the course of zosyn.  X-ray does not show any evidence of osteomyelitis.  MRI of the right foot without contrast did not show any evidence of osteomyelitis but showed superficial phlegmon overlying the posterior  calcaneus. 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  Patient appears very deconditioned. PT/OT EVAL recommending SNF on discharge. Pt is in agreement.   She started having fevers and  complaining of pain in the left foot on 05/15/2020. X rays of the foot ordered, which did not show any osteomyelitis.  Blood cultures from 05/15/2020 positive for klebsiella aerogenes. She was started on cefepime. Unclear source of infection. UA, ordered, CXR does nto show any infection. Patient has arterial line placed on 05/05/2020. It will be removed today.  MRI of the left foot without contrast ordered for further evaluation.  CRP is 13, and procalcitonin is 4.19.   Hypotension Appears to have resolved. BP parameters are optimal.    Stage IV CKD, metabolic acidosis the setting of end-stage renal disease. S/p renal transplant on chronic immunosuppressive medications/ Prograf. Creatinine is stable.  Bicarb improved on sodium bicarb tabs.    Hyperkalemia with peaked t waves.  Improvement of potassium to 4.8 today with lokelma.  Nephrology consulted and have signed off.    Severe protein calorie malnutrition/cachexia. Poor oral intake,  cor track was placed and tube feeds were started.  She continues to have minimal oral intake.  Albumin is 1.2. In view of poor oral intake, IR consulted to see if she is a candidate for PEG placement. Unfortunately pt has a h/o gastric sleeve and IR suggested that they do not have a proper window for percutaneous g tube placement and recommended surgery evaluation for G tube placement.     Paroxysmal atrial fibrillation Rate not well controlled, currently on  amiodarone 200 mg daily.  Will add BB prn.  On Eliquis for anticoagulation.  Echocardiogram showed Left ventricular ejection fraction, by estimation, is 70 to 75%. The left ventricle has hyperdynamic function. The left ventricle has no regional wall motion abnormalities. There is moderate concentric left   ventricular hypertrophy. Indeterminate  diastolic filling due to E-A fusion.  Her CHAD2 VASC2 score is 3 to 4.    Anemia of chronic disease/GI blood loss Postoperative anemia S/p 1 unit of PRBC transfusion. Transfuse to keep hemoglobin greater than 7. Hemoglobin stable between 7 to 8.    Hypoalbuminemia/cachexia Nutritionist  consulted and on tube feeds via cortrak. Pt has poor oral intake.   Type 2 diabetes mellitus with nephropathy and neuropathy.  Continue with Lyrica. Continue with sliding scale insulin. CBG (last 3)  Recent Labs    05/17/20 0618 05/17/20 0804 05/17/20 1216  GLUCAP 147* 145* 126*   Continue with SSI.    Klebsiella bacteremia:  On IV cefepime and ID consulted for recommendations.                                           Pressure injury Pressure Injury 05/01/20 Heel Left Unstageable - Full thickness tissue loss in which the base of the injury is covered by slough (yellow, tan, gray, green or brown) and/or eschar (tan, brown or black) in the wound bed. dark discoloration unstageable bugg (Active)  05/01/20 2330  Location: Heel  Location Orientation: Left  Staging: Unstageable - Full thickness tissue loss in which the base of the injury is covered by slough (yellow, tan, gray, green or brown) and/or eschar (tan, brown or black) in the wound bed.  Wound Description (Comments): dark discoloration unstageable buggy  Present on Admission: Yes   Wound care consulted and recommendations given.       DVT prophylaxis: Eliquis.  Code Status: full code.  Family Communication: husband at bedside.  Disposition:   Status is: Inpatient  Remains inpatient appropriate because:Ongoing diagnostic testing needed not appropriate for outpatient work up, IV treatments appropriate due to intensity of illness or inability to take PO and Inpatient level of care appropriate due to severity of illness   Dispo: The patient is from: Home              Anticipated d/c is  to: SNF              Anticipated d/c date is: > 3 days              Patient currently is not medically stable to d/c.       Consultants:   Palliative.   Orthopedics.   Nephrology  IR    Procedures: s/p right BKA   Antimicrobials:  Anti-infectives (From admission, onward)   Start     Dose/Rate Route Frequency Ordered Stop   05/16/20 1400  ceFEPIme (MAXIPIME) 2 g in sodium chloride 0.9 % 100 mL IVPB     Discontinue     2 g 200 mL/hr over 30 Minutes Intravenous Every 24 hours 05/16/20 1351     05/07/20 1400  vancomycin (VANCOCIN) IVPB 1000 mg/200 mL premix        1,000 mg 200 mL/hr over 60 Minutes Intravenous On call to O.R. 05/07/20 0741 05/07/20 1550   05/05/20 1330  piperacillin-tazobactam (ZOSYN) IVPB 3.375 g  Status:  Discontinued        3.375 g 12.5  mL/hr over 240 Minutes Intravenous Every 8 hours 05/05/20 1251 05/12/20 1250   05/02/20 1600  vancomycin (VANCOREADY) IVPB 750 mg/150 mL  Status:  Discontinued        750 mg 150 mL/hr over 60 Minutes Intravenous Every 24 hours 05/01/20 1612 05/05/20 1251   05/02/20 1600  cefTRIAXone (ROCEPHIN) 2 g in sodium chloride 0.9 % 100 mL IVPB  Status:  Discontinued        2 g 200 mL/hr over 30 Minutes Intravenous Every 24 hours 05/01/20 1700 05/05/20 1250   05/01/20 1715  valGANciclovir (VALCYTE) 450 MG tablet TABS 450 mg  Status:  Discontinued        450 mg Oral Daily 05/01/20 1700 05/01/20 1847   05/01/20 1500  cefTRIAXone (ROCEPHIN) 2 g in sodium chloride 0.9 % 100 mL IVPB        2 g 200 mL/hr over 30 Minutes Intravenous  Once 05/01/20 1452 05/01/20 1638   05/01/20 1500  vancomycin (VANCOREADY) IVPB 1500 mg/300 mL        1,500 mg 150 mL/hr over 120 Minutes Intravenous  Once 05/01/20 1453 05/01/20 1755   05/01/20 1445  vancomycin (VANCOCIN) IVPB 1000 mg/200 mL premix  Status:  Discontinued        1,000 mg 200 mL/hr over 60 Minutes Intravenous  Once 05/01/20 1444 05/01/20 1453       Subjective: Pt reports burning and pain  in the right eye, vision intact.  No chest pain or sob, no nausea or vomiting.  Objective: Vitals:   05/16/20 2355 05/17/20 0415 05/17/20 0820 05/17/20 1100  BP: (!) 146/53 (!) 158/59 99/74 (!) 145/47  Pulse: 96 (!) 111 (!) 118 (!) 119  Resp: 16 20 18 19   Temp: 98.2 F (36.8 C) 98.3 F (36.8 C) (!) 101 F (38.3 C) 99.3 F (37.4 C)  TempSrc: Oral Oral Oral Oral  SpO2: 93% 94% 91% 99%  Weight:  66.7 kg    Height:        Intake/Output Summary (Last 24 hours) at 05/17/2020 1622 Last data filed at 05/17/2020 1453 Gross per 24 hour  Intake 513.54 ml  Output 800 ml  Net -286.46 ml   Filed Weights   05/16/20 0500 05/16/20 1300 05/17/20 0415  Weight: 48.5 kg 66.7 kg 66.7 kg    Examination:  General exam: alert and comfortable.  Respiratory system: decreased air entry at bases, no wheezing or rhonchi.  Cardiovascular system: S1-S2 heard, irregular, no JVD, no left lower extremity edema. Gastrointestinal system: Abdomen is soft, nontender, nondistended, bowel sounds normal. Central nervous system: Alert and oriented and answering questions appropriately Extremities: Right BKA, left lower foot bandaged skin: Left heel ulcer present Psychiatry: Mood is appropriate    Data Reviewed: I have personally reviewed following labs and imaging studies  CBC: Recent Labs  Lab 05/13/20 0436 05/14/20 0421 05/15/20 0250 05/16/20 0500 05/17/20 0441  WBC 8.7 9.8 7.5 7.7 7.8  NEUTROABS 6.3 7.7 5.7 6.2 5.8  HGB 8.6* 8.9* 8.1* 7.7* 7.9*  HCT 26.8* 28.2* 25.7* 24.7* 25.0*  MCV 95.7 94.6 95.9 97.2 98.0  PLT 192 228 210 223 841    Basic Metabolic Panel: Recent Labs  Lab 05/13/20 0436 05/13/20 0436 05/14/20 0421 05/14/20 0421 05/15/20 0250 05/15/20 2237 05/16/20 0500 05/16/20 0726 05/17/20 0441  NA 137  --  137  --  135  --  134*  --  135  K 5.4*   < > 5.7*   < > 5.6* 5.4* 4.9 5.2*  4.8  CL 107  --  105  --  103  --  101  --  102  CO2 20*  --  21*  --  22  --  22  --  22   GLUCOSE 163*  --  202*  --  220*  --  124*  --  167*  BUN 62*  --  69*  --  74*  --  82*  --  99*  CREATININE 2.64*  --  2.70*  --  2.70*  --  2.54*  --  2.55*  CALCIUM 8.1*  --  8.2*  --  8.2*  --  8.4*  --  8.6*  MG 1.9  --  2.1  --  2.1  --  1.9  --  2.2  PHOS 2.6  --  2.8  --  2.7  --  3.6  --  4.2   < > = values in this interval not displayed.    GFR: Estimated Creatinine Clearance: 24.8 mL/min (A) (by C-G formula based on SCr of 2.55 mg/dL (H)).  Liver Function Tests: Recent Labs  Lab 05/13/20 0436 05/14/20 0421 05/15/20 0250 05/16/20 0500 05/17/20 0441  ALBUMIN 1.2* 1.3* 1.2* 1.2* 1.2*    CBG: Recent Labs  Lab 05/16/20 2353 05/17/20 0414 05/17/20 0618 05/17/20 0804 05/17/20 1216  GLUCAP 155* 144* 147* 145* 126*     Recent Results (from the past 240 hour(s))  Culture, blood (Routine X 2) w Reflex to ID Panel     Status: Abnormal (Preliminary result)   Collection Time: 05/15/20 10:21 PM   Specimen: BLOOD LEFT HAND  Result Value Ref Range Status   Specimen Description BLOOD LEFT HAND  Final   Special Requests   Final    BOTTLES DRAWN AEROBIC AND ANAEROBIC Blood Culture adequate volume   Culture  Setup Time   Final    GRAM NEGATIVE RODS IN BOTH AEROBIC AND ANAEROBIC BOTTLES CRITICAL RESULT CALLED TO, READ BACK BY AND VERIFIED WITH: C. KO PHARMD, AT 5852 05/16/20 BY D. VANHOOK    Culture (A)  Final    ENTEROBACTER AEROGENES SUSCEPTIBILITIES TO FOLLOW Performed at [facility], [place] [address] [place], [place] [address]    Report Status PENDING  Incomplete  Blood Culture ID Panel (Reflexed)     Status: Abnormal   Collection Time: 05/15/20 10:21 PM  Result Value Ref Range Status   Enterococcus faecalis NOT DETECTED NOT DETECTED Final   Enterococcus Faecium NOT DETECTED NOT DETECTED Final   Listeria monocytogenes NOT DETECTED NOT DETECTED Final   Staphylococcus species NOT DETECTED NOT DETECTED Final   Staphylococcus aureus (BCID) NOT DETECTED NOT  DETECTED Final   Staphylococcus epidermidis NOT DETECTED NOT DETECTED Final   Staphylococcus lugdunensis NOT DETECTED NOT DETECTED Final   Streptococcus species NOT DETECTED NOT DETECTED Final   Streptococcus agalactiae NOT DETECTED NOT DETECTED Final   Streptococcus pneumoniae NOT DETECTED NOT DETECTED Final   Streptococcus pyogenes NOT DETECTED NOT DETECTED Final   A.calcoaceticus-baumannii NOT DETECTED NOT DETECTED Final   Bacteroides fragilis NOT DETECTED NOT DETECTED Final   Enterobacterales DETECTED (A) NOT DETECTED Final    Comment: Enterobacterales represent a large order of gram negative bacteria, not a single organism. CRITICAL RESULT CALLED TO, READ BACK BY AND VERIFIED WITH: C. KO PHARMD, AT 1333 05/16/20 BY D. VANHOOK    Enterobacter cloacae complex NOT DETECTED NOT DETECTED Final   Escherichia coli NOT DETECTED NOT DETECTED Final   Klebsiella aerogenes DETECTED (  A) NOT DETECTED Final    Comment: CRITICAL RESULT CALLED TO, READ BACK BY AND VERIFIED WITH: C. KO PHARMD, AT 1333 05/16/20 BY D. VANHOOK    Klebsiella oxytoca NOT DETECTED NOT DETECTED Final   Klebsiella pneumoniae NOT DETECTED NOT DETECTED Final   Proteus species NOT DETECTED NOT DETECTED Final   Salmonella species NOT DETECTED NOT DETECTED Final   Serratia marcescens NOT DETECTED NOT DETECTED Final   Haemophilus influenzae NOT DETECTED NOT DETECTED Final   Neisseria meningitidis NOT DETECTED NOT DETECTED Final   Pseudomonas aeruginosa NOT DETECTED NOT DETECTED Final   Stenotrophomonas maltophilia NOT DETECTED NOT DETECTED Final   Candida albicans NOT DETECTED NOT DETECTED Final   Candida auris NOT DETECTED NOT DETECTED Final   Candida glabrata NOT DETECTED NOT DETECTED Final   Candida krusei NOT DETECTED NOT DETECTED Final   Candida parapsilosis NOT DETECTED NOT DETECTED Final   Candida tropicalis NOT DETECTED NOT DETECTED Final   Cryptococcus neoformans/gattii NOT DETECTED NOT DETECTED Final   CTX-M ESBL  NOT DETECTED NOT DETECTED Final   Carbapenem resistance IMP NOT DETECTED NOT DETECTED Final   Carbapenem resistance KPC NOT DETECTED NOT DETECTED Final   Carbapenem resistance NDM NOT DETECTED NOT DETECTED Final   Carbapenem resist OXA 48 LIKE NOT DETECTED NOT DETECTED Final   Carbapenem resistance VIM NOT DETECTED NOT DETECTED Final    Comment: Performed at [facility], [address] [place], [place] [address]  Culture, blood (Routine X 2) w Reflex to ID Panel     Status: Abnormal (Preliminary result)   Collection Time: 05/15/20 10:35 PM   Specimen: BLOOD LEFT ARM  Result Value Ref Range Status   Specimen Description BLOOD LEFT ARM  Final   Special Requests   Final    BOTTLES DRAWN AEROBIC AND ANAEROBIC Blood Culture adequate volume   Culture  Setup Time   Final    GRAM NEGATIVE RODS IN BOTH AEROBIC AND ANAEROBIC BOTTLES CRITICAL VALUE NOTED.  VALUE IS CONSISTENT WITH PREVIOUSLY REPORTED AND CALLED VALUE. Performed at [facility], [place] [address] [place], [place] [address]    Culture ENTEROBACTER AEROGENES (A)  Final   Report Status PENDING  Incomplete         Radiology Studies: CT ABDOMEN WO CONTRAST  Result Date: 05/17/2020 CLINICAL DATA:  Poor oral intake and malnutrition. Evaluation for possible gastrostomy tube placement. EXAM: CT ABDOMEN WITHOUT CONTRAST TECHNIQUE: Multidetector CT imaging of the abdomen was performed following the standard protocol without IV contrast. COMPARISON:  02/04/2020 FINDINGS: Lower chest: Elevation of the left hemidiaphragm with small amount of left pleural fluid. Trace right pleural effusion with atelectasis at the posterior right lung base. Hepatobiliary: No focal liver abnormality is seen. Status post cholecystectomy. No biliary dilatation. Pancreas: Unremarkable. No pancreatic ductal dilatation or surrounding inflammatory changes. Spleen: Normal in size without focal abnormality. Adrenals/Urinary Tract: Atrophic bilateral kidneys  without evidence of hydronephrosis or focal lesions by unenhanced CT. No adrenal masses. Stomach/Bowel: Feeding tube present extending into the proximal duodenum. There is evidence of prior gastric surgery consistent with gastric sleeve procedure. No evidence of bowel obstruction, significant ileus or free intraperitoneal air. Vascular/Lymphatic: Aortic atherosclerosis without aneurysm. No enlarged abdominal lymph nodes. Other: No ascites or focal fluid collections. No abdominal hernias identified. Musculoskeletal: No acute or significant osseous findings. IMPRESSION: 1. No acute findings in the abdomen or pelvis. 2. Elevation of the left hemidiaphragm with small amount of left pleural fluid. 3. Trace right pleural effusion  with atelectasis at the posterior right lung base. 4. Atrophic bilateral kidneys. 5. Aortic atherosclerosis without aneurysm. 6. Status post gastric sleeve procedure. For this reason the patient is not a candidate for percutaneous gastrostomy tube placement due to possible inadequate ability to insufflate the stomach with air after prior surgery as well as higher risk of complication after prior gastric sleeve. Aortic Atherosclerosis (ICD10-I70.0). Electronically Signed   By: Aletta Edouard M.D.   On: 05/17/2020 08:35   DG Chest 2 View  Result Date: 05/15/2020 CLINICAL DATA:  59 year old female with fever. EXAM: CHEST - 2 VIEW COMPARISON:  Chest radiograph dated 05/01/2020 FINDINGS: Feeding tube extends below the diaphragm with tip beyond the inferior margin of the image. Shallow inspiration. Diffuse bilateral interstitial prominence and hazy airspace density most likely represent edema and small bilateral pleural effusions. Pneumonia is not excluded. Clinical correlation is recommended. There is no pneumothorax. The cardiac silhouette is within limits. No acute osseous pathology. IMPRESSION: Findings most likely represent edema and small bilateral pleural effusions. Pneumonia is not  excluded. Electronically Signed   By: Anner Crete M.D.   On: 05/15/2020 20:13   DG Foot Complete Left  Result Date: 05/16/2020 CLINICAL DATA:  Left foot pain. EXAM: LEFT FOOT - COMPLETE 3+ VIEW COMPARISON:  05/01/2020 FINDINGS: Examination demonstrates no evidence of acute fracture or dislocation. Remaining bones and soft tissues otherwise unchanged. IMPRESSION: No acute findings. Electronically Signed   By: Marin Olp M.D.   On: 05/16/2020 16:52        Scheduled Meds: . sodium chloride   Intravenous Once  . sodium chloride   Intravenous Once  . amiodarone  200 mg Oral Daily  . apixaban  5 mg Oral BID  . Chlorhexidine Gluconate Cloth  6 each Topical Daily  . darbepoetin (ARANESP) injection - NON-DIALYSIS  150 mcg Subcutaneous Q Mon-1800  . feeding supplement (ENSURE ENLIVE)  237 mL Oral TID BM  . feeding supplement (NEPRO CARB STEADY)  910 mL Per Tube Q24H  . feeding supplement (PROSource TF)  90 mL Per Tube BID  . folic acid  1 mg Oral Daily  . insulin aspart  0-15 Units Subcutaneous TID WC  . insulin aspart  0-5 Units Subcutaneous QHS  . insulin aspart  4 Units Subcutaneous Q4H  . lidocaine  5 mL Intradermal Once  . multivitamin with minerals  1 tablet Oral Daily  . nortriptyline  25 mg Oral QHS  . pantoprazole  40 mg Oral Daily  . predniSONE  5 mg Oral Q breakfast  . senna-docusate  1 tablet Oral Daily  . sodium bicarbonate  650 mg Oral BID  . sodium zirconium cyclosilicate  10 g Oral BID  . tacrolimus  1 mg Oral BID  . thiamine  100 mg Oral Daily  . vitamin C  250 mg Oral BID  . zinc sulfate  220 mg Oral Daily   Continuous Infusions: . sodium chloride 250 mL (05/16/20 1537)  . ceFEPime (MAXIPIME) IV 2 g (05/17/20 1341)     LOS: 16 days        Hosie Poisson, MD [facility]   To contact the attending provider between 7A-7P or the covering provider during after hours 7P-7A, please log into the web site [email or web address] and access using universal [facility] password for that web site. If you do not have the password, please call the hospital operator.  05/17/2020, 4:22 PM

## 2020-05-17 NOTE — Progress Notes (Signed)
IR requested by Dr. Karleen Hampshire for possible image-guided percutaneous gastrostomy tube placement.  Case/images have been reviewed by Dr. Kathlene Cote who states patient's anatomy is not favorable for percutaneous gastrostomy tube placement. Per CT abdomen report from 05/17/2020- "Status post gastric sleeve procedure. For this reason the patient is not a candidate for percutaneous gastrostomy tube placement due to possible inadequate ability to insufflate the stomach with air after prior surgery as well as higher risk of complication after prior gastric sleeve". Recommend surgical consult for possible gastrostomy tube placement. No plans for IR intervention at this time- will delete order. Dr. Karleen Hampshire made aware.  IR available in future if needed.   Bea Graff Kratos Ruscitti, PA-C 05/17/2020, 11:00 AM

## 2020-05-18 ENCOUNTER — Encounter (HOSPITAL_BASED_OUTPATIENT_CLINIC_OR_DEPARTMENT_OTHER): Payer: Medicare HMO | Admitting: Internal Medicine

## 2020-05-18 ENCOUNTER — Inpatient Hospital Stay (HOSPITAL_COMMUNITY): Payer: Medicare HMO

## 2020-05-18 DIAGNOSIS — I361 Nonrheumatic tricuspid (valve) insufficiency: Secondary | ICD-10-CM | POA: Diagnosis not present

## 2020-05-18 DIAGNOSIS — R7881 Bacteremia: Secondary | ICD-10-CM

## 2020-05-18 DIAGNOSIS — M79673 Pain in unspecified foot: Secondary | ICD-10-CM

## 2020-05-18 LAB — CBC WITH DIFFERENTIAL/PLATELET
Abs Immature Granulocytes: 0.08 10*3/uL — ABNORMAL HIGH (ref 0.00–0.07)
Basophils Absolute: 0 10*3/uL (ref 0.0–0.1)
Basophils Relative: 0 %
Eosinophils Absolute: 0.1 10*3/uL (ref 0.0–0.5)
Eosinophils Relative: 1 %
HCT: 23 % — ABNORMAL LOW (ref 36.0–46.0)
Hemoglobin: 7.1 g/dL — ABNORMAL LOW (ref 12.0–15.0)
Immature Granulocytes: 1 %
Lymphocytes Relative: 21 %
Lymphs Abs: 1.3 10*3/uL (ref 0.7–4.0)
MCH: 30 pg (ref 26.0–34.0)
MCHC: 30.9 g/dL (ref 30.0–36.0)
MCV: 97 fL (ref 80.0–100.0)
Monocytes Absolute: 0.2 10*3/uL (ref 0.1–1.0)
Monocytes Relative: 3 %
Neutro Abs: 4.4 10*3/uL (ref 1.7–7.7)
Neutrophils Relative %: 74 %
Platelets: 255 10*3/uL (ref 150–400)
RBC: 2.37 MIL/uL — ABNORMAL LOW (ref 3.87–5.11)
RDW: 18.9 % — ABNORMAL HIGH (ref 11.5–15.5)
WBC: 6 10*3/uL (ref 4.0–10.5)
nRBC: 0 % (ref 0.0–0.2)

## 2020-05-18 LAB — ECHOCARDIOGRAM LIMITED
Height: 69 in
Weight: 2480 oz

## 2020-05-18 LAB — CULTURE, BLOOD (ROUTINE X 2)
Special Requests: ADEQUATE
Special Requests: ADEQUATE

## 2020-05-18 LAB — RENAL FUNCTION PANEL
Albumin: 1.1 g/dL — ABNORMAL LOW (ref 3.5–5.0)
Anion gap: 13 (ref 5–15)
BUN: 98 mg/dL — ABNORMAL HIGH (ref 6–20)
CO2: 21 mmol/L — ABNORMAL LOW (ref 22–32)
Calcium: 8.4 mg/dL — ABNORMAL LOW (ref 8.9–10.3)
Chloride: 102 mmol/L (ref 98–111)
Creatinine, Ser: 2.54 mg/dL — ABNORMAL HIGH (ref 0.44–1.00)
GFR calc Af Amer: 23 mL/min — ABNORMAL LOW (ref >60)
GFR calc non Af Amer: 20 mL/min — ABNORMAL LOW (ref >60)
Glucose, Bld: 170 mg/dL — ABNORMAL HIGH (ref 70–99)
Phosphorus: 5.1 mg/dL — ABNORMAL HIGH (ref 2.5–4.6)
Potassium: 4.3 mmol/L (ref 3.5–5.1)
Sodium: 136 mmol/L (ref 135–145)

## 2020-05-18 LAB — GLUCOSE, CAPILLARY
Glucose-Capillary: 109 mg/dL — ABNORMAL HIGH (ref 70–99)
Glucose-Capillary: 117 mg/dL — ABNORMAL HIGH (ref 70–99)
Glucose-Capillary: 142 mg/dL — ABNORMAL HIGH (ref 70–99)
Glucose-Capillary: 176 mg/dL — ABNORMAL HIGH (ref 70–99)
Glucose-Capillary: 180 mg/dL — ABNORMAL HIGH (ref 70–99)
Glucose-Capillary: 183 mg/dL — ABNORMAL HIGH (ref 70–99)
Glucose-Capillary: 186 mg/dL — ABNORMAL HIGH (ref 70–99)
Glucose-Capillary: 218 mg/dL — ABNORMAL HIGH (ref 70–99)

## 2020-05-18 LAB — APTT: aPTT: 51 seconds — ABNORMAL HIGH (ref 24–36)

## 2020-05-18 LAB — PREPARE RBC (CROSSMATCH)

## 2020-05-18 LAB — MAGNESIUM: Magnesium: 2.2 mg/dL (ref 1.7–2.4)

## 2020-05-18 LAB — PROCALCITONIN: Procalcitonin: 5.18 ng/mL

## 2020-05-18 LAB — C-REACTIVE PROTEIN: CRP: 13.4 mg/dL — ABNORMAL HIGH (ref <1.0)

## 2020-05-18 MED ORDER — MIRTAZAPINE 15 MG PO TABS
7.5000 mg | ORAL_TABLET | Freq: Every day | ORAL | Status: DC
  Administered 2020-05-18 – 2020-05-31 (×14): 7.5 mg via ORAL
  Filled 2020-05-18 (×14): qty 1

## 2020-05-18 MED ORDER — SODIUM CHLORIDE 0.9% IV SOLUTION: Freq: Once | INTRAVENOUS | Status: AC

## 2020-05-18 MED ORDER — NEPRO/CARBSTEADY PO LIQD
1000.0000 mL | ORAL | Status: DC
  Administered 2020-05-18 – 2020-05-21 (×4): 1000 mL

## 2020-05-18 MED ORDER — HEPARIN (PORCINE) 25000 UT/250ML-% IV SOLN
950.0000 [IU]/h | INTRAVENOUS | Status: DC
  Administered 2020-05-18: 800 [IU]/h via INTRAVENOUS
  Filled 2020-05-18: qty 250

## 2020-05-18 MED ORDER — NEPRO/CARBSTEADY PO LIQD: 770.0000 mL | ORAL | Status: DC

## 2020-05-18 MED ORDER — SODIUM CHLORIDE 0.9 % IV SOLN
1.0000 g | Freq: Two times a day (BID) | INTRAVENOUS | Status: DC
  Administered 2020-05-18 – 2020-06-01 (×29): 1 g via INTRAVENOUS
  Filled 2020-05-18 (×30): qty 1

## 2020-05-18 NOTE — Progress Notes (Signed)
° °  Wanda [name] has been requested to perform a transesophageal echocardiogram on Wanda [name] for bacteremia and possible vegetation on mitral valve noted on transthoracic echocardiogram.  After careful review of history and examination, the risks and benefits of transesophageal echocardiogram have been explained including risks of esophageal damage, perforation (1:10,000 risk), bleeding, pharyngeal hematoma as well as other potential complications associated with conscious sedation including aspiration, arrhythmia, respiratory failure and death. Alternatives to treatment were discussed, questions were answered. Patient is willing to proceed.   Procedure scheduled for 05/19/2020 at 13:30 with Dr. Harrell Gave. Will place orders.  Of noted, patient anemic and hemoglobin 7.1 today. Will make sure repeat CBC is ordered for tomorrow morning.  Darreld Mclean, PA-C 05/18/2020 4:32 PM

## 2020-05-18 NOTE — Progress Notes (Signed)
PROGRESS NOTE    Wanda [name]  BMW:[id number] DOB: 1961-09-11 DOA: 05/01/2020 PCP: Lorene Dy, MD    Chief Complaint  Patient presents with  . Skin Ulcer  . Fatigue  . Fever    Brief Narrative:   59 year old lady with prior history of diabetes, hypertension, paroxysmal atrial fibrillation on Eliquis, hyperlipidemia, end-stage renal disease s/p failed kidney transplant in 2013 and repeat renal transplant in September 2020, stage IV CKD, anemia of chronic disease, history of gastric sleeve surgery, hypoalbuminemia, right foot ulcer, chronic neuropathy presents with right foot ulcer, fevers.  X-rays of the foot did not show any evidence of osteomyelitis she was started on IV antibiotics and fluids. Pt continues to remain on tube feeds. As per RN, pt is eating between 25 to 50 % orally. She appears very deconditioned. Palliative care consulted for goals of care, and at this time, patient and family would like to aggressively treat the treatable, and the patient reported to [facility] with palliative that if medical treatment does not work, she would think about hospice. Hospital course is complicated by fever and klebsiella Aerogenes bacteremia.  Source of bacteremia unclear. CXR does nt sow infection, UA shows large leukocytes, . MRI of the left foot does not show any osteomyelitis. A line was removed on 05/17/2020. She was started on cefepime. ID consulted, changed IV cefepime to IV meropenem.  Meanwhile General surgery consulted for evaluation of G tube placement in view of her poor oral intake and deconditioning.   Assessment & Plan:   Principal Problem:   Septic shock ([place]) Active Problems:   DM (diabetes mellitus), type 2 with renal complications (HCC)   Essential hypertension   CHRONIC KIDNEY DISEASE STAGE IV ([facility])   Renal transplant disorder   Depression   History of kidney transplant   Wound infection   Leukocytosis   Pressure injury of skin   Palliative care by  specialist   Goals of care, counseling/discussion   DNR (do not resuscitate) discussion   Failure to thrive in adult   Pain of foot   Sepsis present on admission Right foot ulcer infected with surrounding cellulitis. Pt completed the course of zosyn.  X-ray does not show any evidence of osteomyelitis.  MRI of the right foot without contrast did not show any evidence of osteomyelitis but showed superficial phlegmon overlying the posterior calcaneus. 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  Patient appears very deconditioned. PT/OT EVAL recommending SNF on discharge. Pt is in agreement.   She started having fevers and  complaining of pain in the left foot on 05/15/2020. X rays of the foot ordered, which did not show any osteomyelitis.  Blood cultures from 05/15/2020 positive for klebsiella aerogenes. She was started on cefepime, which was changed to meropenam. Unclear source of infection. UA appears abnormal. CXR does nto show any infection. Patient has arterial line placed on 05/05/2020. It was removed.  MRI of the left foot without contrast did not show any osteomyelitis. It shows Superficial ulceration over the posterior calcaneus with heel pad inflammation. No loculated fluid Collections. Insertional Achilles tendinosis with mild reactive marrow. CRP is 13, and procalcitonin is 5.18 ID on board appreciate assistance.   Hypotension Appears to have resolved. BP parameters are optimal.    Stage IV CKD, metabolic acidosis the setting of end-stage renal disease. S/p renal transplant on chronic immunosuppressive medications/ Prograf. Creatinine is stable around 2.54.   Bicarb improved on sodium bicarb tabs.    Hyperkalemia with peaked  t waves.  Improvement of potassium to 4.3 with lokelma.  Nephrology consulted and have signed off.    Severe protein calorie malnutrition/cachexia. Poor oral intake,  cor track was placed and tube feeds were started.  She continues to have  minimal oral intake.  Albumin is 1.2. In view of poor oral intake, IR consulted to see if she is a candidate for PEG placement. Unfortunately pt has a h/o gastric sleeve and IR suggested that they do not have a proper window for percutaneous g tube placement and recommended surgery evaluation for G tube placement.  gen surgery consulted and recommendations to follow.     Paroxysmal atrial fibrillation Rate not well controlled, currently on  amiodarone 200 mg daily. Will add BB prn.  On Eliquis for anticoagulation.  Echocardiogram showed Left ventricular ejection fraction, by estimation, is 70 to 75%. The left ventricle has hyperdynamic function. The left ventricle has no regional wall motion abnormalities. There is moderate concentric left  ventricular hypertrophy. Indeterminate  diastolic filling due to E-A fusion.  Her CHAD2 VASC2 score is 3 to 4.    Anemia of acute illness superimposed on Anemia of chronic disease/GI blood loss Postoperative anemia S/p 1 unit of PRBC transfusion. Transfuse to keep hemoglobin greater than 7. Hemoglobin is 7.1 today. Another unit of prbc transfusion.    Hypoalbuminemia/cachexia Nutritionist  consulted and on tube feeds via cortrak. Pt has poor oral intake. Added on Remeron for appetite stimulation.    Type 2 diabetes mellitus with nephropathy and neuropathy.  Continue with Lyrica. Continue with sliding scale insulin. CBG (last 3)  Recent Labs    05/18/20 0607 05/18/20 0806 05/18/20 1140  GLUCAP 218* 183* 117*   Continue with SSI.  No changes in meds.    Klebsiella bacteremia:  On IV cefepime and ID consulted for recommendations.  Echocardiogram ordered and it showed there appears to be a mobile mass/vegeation on the ventricular surface at the base of the anterior mitral leaflet that extends into the LVOT Suggest TEE to further evaluate if clinically  indicated Similar appearance to echo done on 05/05/20 . TEE will be ordered.                                          Pressure injury Pressure Injury 05/01/20 Heel Left Unstageable - Full thickness tissue loss in which the base of the injury is covered by slough (yellow, tan, gray, green or brown) and/or eschar (tan, brown or black) in the wound bed. dark discoloration unstageable bugg (Active)  05/01/20 2330  Location: Heel  Location Orientation: Left  Staging: Unstageable - Full thickness tissue loss in which the base of the injury is covered by slough (yellow, tan, gray, green or brown) and/or eschar (tan, brown or black) in the wound bed.  Wound Description (Comments): dark discoloration unstageable buggy  Present on Admission: Yes   Wound care consulted and recommendations given.       DVT prophylaxis: Eliquis/ transition to heparin.  Code Status: full code.  Family Communication: husband at bedside.  Disposition:   Status is: Inpatient  Remains inpatient appropriate because:Ongoing diagnostic testing needed not appropriate for outpatient work up, IV treatments appropriate due to intensity of illness or inability to take PO and Inpatient level of care appropriate due to severity of illness   Dispo: The patient is from: Home  Anticipated d/c is to: SNF              Anticipated d/c date is: > 3 days              Patient currently is not medically stable to d/c.       Consultants:   Palliative.   Orthopedics.   Nephrology  IR   GEN surgery  ID    Procedures: s/p right BKA   Antimicrobials:  Anti-infectives (From admission, onward)   Start     Dose/Rate Route Frequency Ordered Stop   05/18/20 1030  meropenem (MERREM) 1 g in sodium chloride 0.9 % 100 mL IVPB     Discontinue     1 g 200 mL/hr over 30 Minutes Intravenous Every 12 hours 05/18/20 1001     05/16/20 1400  ceFEPIme (MAXIPIME) 2 g in sodium chloride 0.9 % 100 mL IVPB  Status:  Discontinued        2 g 200 mL/hr over 30 Minutes Intravenous Every 24 hours 05/16/20 1351 05/18/20  1001   05/07/20 1400  vancomycin (VANCOCIN) IVPB 1000 mg/200 mL premix        1,000 mg 200 mL/hr over 60 Minutes Intravenous On call to O.R. 05/07/20 0741 05/07/20 1550   05/05/20 1330  piperacillin-tazobactam (ZOSYN) IVPB 3.375 g  Status:  Discontinued        3.375 g 12.5 mL/hr over 240 Minutes Intravenous Every 8 hours 05/05/20 1251 05/12/20 1250   05/02/20 1600  vancomycin (VANCOREADY) IVPB 750 mg/150 mL  Status:  Discontinued        750 mg 150 mL/hr over 60 Minutes Intravenous Every 24 hours 05/01/20 1612 05/05/20 1251   05/02/20 1600  cefTRIAXone (ROCEPHIN) 2 g in sodium chloride 0.9 % 100 mL IVPB  Status:  Discontinued        2 g 200 mL/hr over 30 Minutes Intravenous Every 24 hours 05/01/20 1700 05/05/20 1250   05/01/20 1715  valGANciclovir (VALCYTE) 450 MG tablet TABS 450 mg  Status:  Discontinued        450 mg Oral Daily 05/01/20 1700 05/01/20 1847   05/01/20 1500  cefTRIAXone (ROCEPHIN) 2 g in sodium chloride 0.9 % 100 mL IVPB        2 g 200 mL/hr over 30 Minutes Intravenous  Once 05/01/20 1452 05/01/20 1638   05/01/20 1500  vancomycin (VANCOREADY) IVPB 1500 mg/300 mL        1,500 mg 150 mL/hr over 120 Minutes Intravenous  Once 05/01/20 1453 05/01/20 1755   05/01/20 1445  vancomycin (VANCOCIN) IVPB 1000 mg/200 mL premix  Status:  Discontinued        1,000 mg 200 mL/hr over 60 Minutes Intravenous  Once 05/01/20 1444 05/01/20 1453       Subjective: No chest pain, reports feeling better than the last two days.  As per RN pt appears depressed.  No nausea, vomiting or abd pain.  Objective: Vitals:   05/18/20 0803 05/18/20 1136 05/18/20 1229 05/18/20 1525  BP: 99/65 (!) 80/30 (!) 80/32 101/68  Pulse: (!) 117 (!) 109 (!) 108 (!) 109  Resp: 18 (!) 21 20 17   Temp: 99 F (37.2 C) 98.7 F (37.1 C) 98.8 F (37.1 C) 98.2 F (36.8 C)  TempSrc: Oral Oral Oral Oral  SpO2: 91% 92% 94% 96%  Weight:      Height:        Intake/Output Summary (Last 24 hours) at 05/18/2020  1541 Last data filed at  05/18/2020 1500 Gross per 24 hour  Intake 2771 ml  Output 481 ml  Net 2290 ml   Filed Weights   05/16/20 1300 05/17/20 0415 05/18/20 0404  Weight: 66.7 kg 66.7 kg 70.3 kg    Examination:  General exam: Alert, does not appear to be in distress, has cor trak Respiratory system: diminished air entry at bases, no wheezing or rhonchi.  Cardiovascular system: S1S2, heard, irregularly irregular, no JVD,  Gastrointestinal system: abd is soft, non tender nondistended, bowel sounds wnl.  Central nervous system: Alert, oriented, and answering all questions appropriately.  Extremities: Right BKA, left foot bandaged.  skin: Left heel ulcer present Psychiatry: Mood is appropriate.     Data Reviewed: I have personally reviewed following labs and imaging studies  CBC: Recent Labs  Lab 05/14/20 0421 05/15/20 0250 05/16/20 0500 05/17/20 0441 05/18/20 0136  WBC 9.8 7.5 7.7 7.8 6.0  NEUTROABS 7.7 5.7 6.2 5.8 4.4  HGB 8.9* 8.1* 7.7* 7.9* 7.1*  HCT 28.2* 25.7* 24.7* 25.0* 23.0*  MCV 94.6 95.9 97.2 98.0 97.0  PLT 228 210 223 275 191    Basic Metabolic Panel: Recent Labs  Lab 05/14/20 0421 05/14/20 0421 05/15/20 0250 05/15/20 0250 05/15/20 2237 05/16/20 0500 05/16/20 0726 05/17/20 0441 05/18/20 0136  NA 137  --  135  --   --  134*  --  135 136  K 5.7*   < > 5.6*   < > 5.4* 4.9 5.2* 4.8 4.3  CL 105  --  103  --   --  101  --  102 102  CO2 21*  --  22  --   --  22  --  22 21*  GLUCOSE 202*  --  220*  --   --  124*  --  167* 170*  BUN 69*  --  74*  --   --  82*  --  99* 98*  CREATININE 2.70*  --  2.70*  --   --  2.54*  --  2.55* 2.54*  CALCIUM 8.2*  --  8.2*  --   --  8.4*  --  8.6* 8.4*  MG 2.1  --  2.1  --   --  1.9  --  2.2 2.2  PHOS 2.8  --  2.7  --   --  3.6  --  4.2 5.1*   < > = values in this interval not displayed.    GFR: Estimated Creatinine Clearance: 24.9 mL/min (A) (by C-G formula based on SCr of 2.54 mg/dL (H)).  Liver Function  Tests: Recent Labs  Lab 05/14/20 0421 05/15/20 0250 05/16/20 0500 05/17/20 0441 05/18/20 0136  ALBUMIN 1.3* 1.2* 1.2* 1.2* 1.1*    CBG: Recent Labs  Lab 05/18/20 0035 05/18/20 0354 05/18/20 0607 05/18/20 0806 05/18/20 1140  GLUCAP 186* 176* 218* 183* 117*     Recent Results (from the past 240 hour(s))  Culture, blood (Routine X 2) w Reflex to ID Panel     Status: Abnormal   Collection Time: 05/15/20 10:21 PM   Specimen: BLOOD LEFT HAND  Result Value Ref Range Status   Specimen Description BLOOD LEFT HAND  Final   Special Requests   Final    BOTTLES DRAWN AEROBIC AND ANAEROBIC Blood Culture adequate volume   Culture  Setup Time   Final    GRAM NEGATIVE RODS IN BOTH AEROBIC AND ANAEROBIC BOTTLES CRITICAL RESULT CALLED TO, READ BACK BY AND VERIFIED WITH: C. KO PHARMD, AT 1333 05/16/20 BY  D. VANHOOK Performed at [facility], [place] [address] [place], [place] [address]    Culture ENTEROBACTER AEROGENES (A)  Final   Report Status 05/18/2020 FINAL  Final   Organism ID, Bacteria ENTEROBACTER AEROGENES  Final      Susceptibility   Enterobacter aerogenes - MIC*    CEFAZOLIN >=64 RESISTANT Resistant     CEFEPIME 1 SENSITIVE Sensitive     CEFTAZIDIME >=64 RESISTANT Resistant     CEFTRIAXONE >=64 RESISTANT Resistant     CIPROFLOXACIN <=0.25 SENSITIVE Sensitive     GENTAMICIN <=1 SENSITIVE Sensitive     IMIPENEM 0.5 SENSITIVE Sensitive     TRIMETH/SULFA <=20 SENSITIVE Sensitive     PIP/TAZO >=128 RESISTANT Resistant     * ENTEROBACTER AEROGENES  Blood Culture ID Panel (Reflexed)     Status: Abnormal   Collection Time: 05/15/20 10:21 PM  Result Value Ref Range Status   Enterococcus faecalis NOT DETECTED NOT DETECTED Final   Enterococcus Faecium NOT DETECTED NOT DETECTED Final   Listeria monocytogenes NOT DETECTED NOT DETECTED Final   Staphylococcus species NOT DETECTED NOT DETECTED Final   Staphylococcus aureus (BCID) NOT DETECTED NOT DETECTED Final   Staphylococcus  epidermidis NOT DETECTED NOT DETECTED Final   Staphylococcus lugdunensis NOT DETECTED NOT DETECTED Final   Streptococcus species NOT DETECTED NOT DETECTED Final   Streptococcus agalactiae NOT DETECTED NOT DETECTED Final   Streptococcus pneumoniae NOT DETECTED NOT DETECTED Final   Streptococcus pyogenes NOT DETECTED NOT DETECTED Final   A.calcoaceticus-baumannii NOT DETECTED NOT DETECTED Final   Bacteroides fragilis NOT DETECTED NOT DETECTED Final   Enterobacterales DETECTED (A) NOT DETECTED Final    Comment: Enterobacterales represent a large order of gram negative bacteria, not a single organism. CRITICAL RESULT CALLED TO, READ BACK BY AND VERIFIED WITH: C. KO PHARMD, AT 1333 05/16/20 BY D. VANHOOK    Enterobacter cloacae complex NOT DETECTED NOT DETECTED Final   Escherichia coli NOT DETECTED NOT DETECTED Final   Klebsiella aerogenes DETECTED (A) NOT DETECTED Final    Comment: CRITICAL RESULT CALLED TO, READ BACK BY AND VERIFIED WITH: C. KO PHARMD, AT 1333 05/16/20 BY D. VANHOOK    Klebsiella oxytoca NOT DETECTED NOT DETECTED Final   Klebsiella pneumoniae NOT DETECTED NOT DETECTED Final   Proteus species NOT DETECTED NOT DETECTED Final   Salmonella species NOT DETECTED NOT DETECTED Final   Serratia marcescens NOT DETECTED NOT DETECTED Final   Haemophilus influenzae NOT DETECTED NOT DETECTED Final   Neisseria meningitidis NOT DETECTED NOT DETECTED Final   Pseudomonas aeruginosa NOT DETECTED NOT DETECTED Final   Stenotrophomonas maltophilia NOT DETECTED NOT DETECTED Final   Candida albicans NOT DETECTED NOT DETECTED Final   Candida auris NOT DETECTED NOT DETECTED Final   Candida glabrata NOT DETECTED NOT DETECTED Final   Candida krusei NOT DETECTED NOT DETECTED Final   Candida parapsilosis NOT DETECTED NOT DETECTED Final   Candida tropicalis NOT DETECTED NOT DETECTED Final   Cryptococcus neoformans/gattii NOT DETECTED NOT DETECTED Final   CTX-M ESBL NOT DETECTED NOT DETECTED Final    Carbapenem resistance IMP NOT DETECTED NOT DETECTED Final   Carbapenem resistance KPC NOT DETECTED NOT DETECTED Final   Carbapenem resistance NDM NOT DETECTED NOT DETECTED Final   Carbapenem resist OXA 48 LIKE NOT DETECTED NOT DETECTED Final   Carbapenem resistance VIM NOT DETECTED NOT DETECTED Final    Comment: Performed at [facility], [address] [place], [place] [address]  Culture, blood (Routine X 2)  w Reflex to ID Panel     Status: Abnormal   Collection Time: 05/15/20 10:35 PM   Specimen: BLOOD LEFT ARM  Result Value Ref Range Status   Specimen Description BLOOD LEFT ARM  Final   Special Requests   Final    BOTTLES DRAWN AEROBIC AND ANAEROBIC Blood Culture adequate volume   Culture  Setup Time   Final    GRAM NEGATIVE RODS IN BOTH AEROBIC AND ANAEROBIC BOTTLES CRITICAL VALUE NOTED.  VALUE IS CONSISTENT WITH PREVIOUSLY REPORTED AND CALLED VALUE.    Culture (A)  Final    ENTEROBACTER AEROGENES SUSCEPTIBILITIES PERFORMED ON PREVIOUS CULTURE WITHIN THE LAST 5 DAYS. Performed at [facility], [place] [address] [place], [place] [address]    Report Status 05/18/2020 FINAL  Final         Radiology Studies: CT ABDOMEN WO CONTRAST  Result Date: 05/17/2020 CLINICAL DATA:  Poor oral intake and malnutrition. Evaluation for possible gastrostomy tube placement. EXAM: CT ABDOMEN WITHOUT CONTRAST TECHNIQUE: Multidetector CT imaging of the abdomen was performed following the standard protocol without IV contrast. COMPARISON:  02/04/2020 FINDINGS: Lower chest: Elevation of the left hemidiaphragm with small amount of left pleural fluid. Trace right pleural effusion with atelectasis at the posterior right lung base. Hepatobiliary: No focal liver abnormality is seen. Status post cholecystectomy. No biliary dilatation. Pancreas: Unremarkable. No pancreatic ductal dilatation or surrounding inflammatory changes. Spleen: Normal in size without focal abnormality. Adrenals/Urinary Tract:  Atrophic bilateral kidneys without evidence of hydronephrosis or focal lesions by unenhanced CT. No adrenal masses. Stomach/Bowel: Feeding tube present extending into the proximal duodenum. There is evidence of prior gastric surgery consistent with gastric sleeve procedure. No evidence of bowel obstruction, significant ileus or free intraperitoneal air. Vascular/Lymphatic: Aortic atherosclerosis without aneurysm. No enlarged abdominal lymph nodes. Other: No ascites or focal fluid collections. No abdominal hernias identified. Musculoskeletal: No acute or significant osseous findings. IMPRESSION: 1. No acute findings in the abdomen or pelvis. 2. Elevation of the left hemidiaphragm with small amount of left pleural fluid. 3. Trace right pleural effusion with atelectasis at the posterior right lung base. 4. Atrophic bilateral kidneys. 5. Aortic atherosclerosis without aneurysm. 6. Status post gastric sleeve procedure. For this reason the patient is not a candidate for percutaneous gastrostomy tube placement due to possible inadequate ability to insufflate the stomach with air after prior surgery as well as higher risk of complication after prior gastric sleeve. Aortic Atherosclerosis (ICD10-I70.0). Electronically Signed   By: Aletta Edouard M.D.   On: 05/17/2020 08:35   CT HEAD WO CONTRAST  Result Date: 05/17/2020 CLINICAL DATA:  Neuro deficit EXAM: CT HEAD WITHOUT CONTRAST TECHNIQUE: Contiguous axial images were obtained from the base of the skull through the vertex without intravenous contrast. COMPARISON:  10/16/2017 head CT and prior. FINDINGS: Brain: No acute infarct or intracranial hemorrhage. No mass lesion. No midline shift, ventriculomegaly or extra-axial fluid collection. Vascular: No hyperdense vessel. Bilateral skull base atherosclerotic calcifications. Skull: Negative for fracture or focal lesion. Sinuses/Orbits: Normal orbits. Minimal left maxillary sinus mucosal thickening. No mastoid effusion.  Other: None. IMPRESSION: No acute intracranial process. Electronically Signed   By: Primitivo Gauze M.D.   On: 05/17/2020 18:05   MR FOOT LEFT WO CONTRAST  Result Date: 05/17/2020 CLINICAL DATA:  Chronic left foot pain EXAM: MRI OF THE LEFT FOOT WITHOUT CONTRAST TECHNIQUE: Multiplanar, multisequence MR imaging of the left was performed. No intravenous contrast was administered. COMPARISON:  None. FINDINGS: Bones/Joint/Cartilage No areas of cortical destruction  or periosteal reaction are seen. There is minimally increased T2 hyperintense signal seen at the insertion site of the Achilles tendon with a small calcaneal enthesophyte. Normal osseous marrow signal seen throughout the remainder of the hindfoot. A small ankle joint effusion is present. The articular surfaces are maintained. Ligaments The Lisfranc ligaments are intact. Muscles and Tendons Mild fatty atrophy with increased signal seen throughout the muscles of the hindfoot. The visualized flexor and extensor tendons are intact. There is mildly increased signal seen within the 1 cm segment of Achilles tendon at the insertion site. Soft tissues Area of superficial ulceration overlying the posterior medial calcaneus with heel pad inflammation. There is diffuse subcutaneous edema seen within the plantar surface. No loculated fluid collections or sinus tract. IMPRESSION: No evidence of osteomyelitis. Superficial ulceration over the posterior calcaneus with heel pad inflammation. No loculated fluid collections. Insertional Achilles tendinosis with mild reactive marrow. Electronically Signed   By: Prudencio Pair M.D.   On: 05/17/2020 19:40   DG Foot Complete Left  Result Date: 05/16/2020 CLINICAL DATA:  Left foot pain. EXAM: LEFT FOOT - COMPLETE 3+ VIEW COMPARISON:  05/01/2020 FINDINGS: Examination demonstrates no evidence of acute fracture or dislocation. Remaining bones and soft tissues otherwise unchanged. IMPRESSION: No acute findings. Electronically  Signed   By: Marin Olp M.D.   On: 05/16/2020 16:52   ECHOCARDIOGRAM LIMITED  Result Date: 05/18/2020    ECHOCARDIOGRAM LIMITED REPORT   Patient Name:   Wanda [name] Date of Exam: 05/18/2020 Medical Rec #:  [id number]         Height:       69.0 in Accession #:    [id number]        Weight:       155.0 lb Date of Birth:  [birth year]        BSA:          1.854 m Patient Age:    39 years          BP:           80/32 mmHg Patient Gender: F                 HR:           108 bpm. Exam Location:  Inpatient Procedure: Limited Echo, Limited Color Doppler and Cardiac Doppler Indications:    Bacteremia 790.7 / R78.81  History:        Patient has prior history of Echocardiogram examinations, most                 recent 05/05/2020. Risk Factors:Sleep Apnea, Dyslipidemia and                 Diabetes. Right lower extremity wound, amputation on 8/5.                 Chronic kidney disease- history of kidney transplant,                 Leukocytosis, Aerogenes bacteremia, Sepsis present on admission.  Sonographer:    Darlina Sicilian RDCS Referring Phys: [place]  1. Left ventricular ejection fraction, by estimation, is 60 to 65%. The left ventricle has normal function.  2. Left atrial size was moderately dilated.  3. Effusion appears smaller than seen on echo 05/05/20 . The pericardial effusion is lateral to the left ventricle and anterior to the right ventricle.  4. MV leaflets are thickened and calcified with acoustic artifact There is also moderate  posterior annular calcification However there appears to be a mobile mass/vegeation on the ventricular surface at the base of the anterior mitral leaflet that extends into the LVOT Suggest TEE to further evaluate if clinically indicated Similar appearance to echo done on 05/05/20 . The mitral valve is abnormal. Trivial mitral valve regurgitation.  5. The aortic valve is tricuspid. Aortic valve regurgitation is not visualized. Moderate Sclerosis. FINDINGS   Left Ventricle: Left ventricular ejection fraction, by estimation, is 60 to 65%. The left ventricle has normal function. Left Atrium: Left atrial size was moderately dilated. Pericardium: Effusion appears smaller than seen on echo 05/05/20. Trivial pericardial effusion is present. The pericardial effusion is lateral to the left ventricle and anterior to the right ventricle. Mitral Valve: MV leaflets are thickened and calcified with acoustic artifact There is also moderate posterior annular calcification However there appears to be a mobile mass/vegeation on the ventricular surface at the base of the anterior mitral leaflet that extends into the LVOT Suggest TEE to further evaluate if clinically indicated Similar appearance to echo done on 05/05/20. The mitral valve is abnormal. Trivial mitral valve regurgitation. Tricuspid Valve: The tricuspid valve is normal in structure. Tricuspid valve regurgitation is mild. Aortic Valve: The aortic valve is tricuspid. Aortic valve regurgitation is not visualized. Moderate Sclerosis. Pulmonic Valve: The pulmonic valve was normal in structure. Pulmonic valve regurgitation is trivial. LEFT VENTRICLE PLAX 2D LVOT diam:     2.20 cm LV SV:         65 LV SV Index:   35 LVOT Area:     3.80 cm  AORTIC VALVE LVOT Vmax:   110.00 cm/s LVOT Vmean:  84.200 cm/s LVOT VTI:    0.171 m  SHUNTS Systemic VTI:  0.17 m Systemic Diam: 2.20 cm Jenkins Rouge MD Electronically signed by Jenkins Rouge MD Signature Date/Time: 05/18/2020/3:35:15 PM    Final         Scheduled Meds: . sodium chloride   Intravenous Once  . sodium chloride   Intravenous Once  . amiodarone  200 mg Oral Daily  . Chlorhexidine Gluconate Cloth  6 each Topical Daily  . darbepoetin (ARANESP) injection - NON-DIALYSIS  150 mcg Subcutaneous Q Mon-1800  . feeding supplement (ENSURE ENLIVE)  237 mL Oral TID BM  . feeding supplement (NEPRO CARB STEADY)  1,000 mL Per Tube Q24H  . feeding supplement (PROSource TF)  90 mL Per Tube  BID  . folic acid  1 mg Oral Daily  . insulin aspart  0-15 Units Subcutaneous TID WC  . insulin aspart  0-5 Units Subcutaneous QHS  . insulin aspart  4 Units Subcutaneous Q4H  . lidocaine  5 mL Intradermal Once  . mirtazapine  7.5 mg Oral QHS  . multivitamin with minerals  1 tablet Oral Daily  . nortriptyline  25 mg Oral QHS  . pantoprazole  40 mg Oral Daily  . predniSONE  5 mg Oral Q breakfast  . senna-docusate  1 tablet Oral Daily  . sodium bicarbonate  650 mg Oral BID  . sodium zirconium cyclosilicate  10 g Oral BID  . tacrolimus  1 mg Oral BID  . thiamine  100 mg Oral Daily  . vitamin C  250 mg Oral BID  . zinc sulfate  220 mg Oral Daily   Continuous Infusions: . sodium chloride 250 mL (05/16/20 1537)  . heparin    . meropenem (MERREM) IV 1 g (05/18/20 1231)     LOS: 17 days  Hosie Poisson, MD [facility]   To contact the attending provider between 7A-7P or the covering provider during after hours 7P-7A, please log into the web site [email or web address] and access using universal [place] password for that web site. If you do not have the password, please call the hospital operator.  05/18/2020, 3:41 PM

## 2020-05-18 NOTE — Progress Notes (Signed)
Nutrition Follow-up  DOCUMENTATION CODES:   Severe malnutrition in context of chronic illness  INTERVENTION:   48 hour Calorie Count   Continue EN via Cortrak tube until pt demonstrates consistent PO intake  Decrease to nocturnal feedings:  -Nepro @ 55 ml/hr x 14 hrs via Cortrak (6pm to 8am) -90 ml ProSource BID  Provides: 1546 kcals, 106 grams protein, 560 ml free water. Meets 74% kcal needs and 96% of protein needs.   Ensure Enlive po TID, each supplement provides 350 kcal and 20 grams of protein  NUTRITION DIAGNOSIS:   Severe Malnutrition related to chronic illness as evidenced by moderate fat depletion, severe muscle depletion, percent weight loss.  Ongoing  GOAL:   Patient will meet greater than or equal to 90% of their needs  Addressed via TF  MONITOR:   PO intake, Supplement acceptance, Labs, Weight trends, Skin  REASON FOR ASSESSMENT:   Consult Assessment of nutrition requirement/status  ASSESSMENT:   59 year old female who presented on 7/30 with acute worsening of right foot wound. PMH of anemia, ESRD s/p failed kidney transplant in 2013 and repeat transplatnt in September 2020 with current CKD stage IV, T2DM, HLD, PAD, bariatric surgery (gastric sleeve). Pt admitted with sepsis.   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 Last two meal completions charted as 25% and 50%. Calorie count attempted over the weekend put no slips were saved. Per surgery, pt is not a candidate for PEG and is a poor surgical G-tube candidate given immunosuppression on daily prednisone and prograf.   Will attempt another calorie count and decrease nocturnal feedings. Pt may benefit from appetite stimulant. Discussed with surgery.   Potassium now wdl.   Admission weight: 70.1 kg  Current weight: 70.3 kg   Medications: aranesp, folic acid, SS novolog, MVI with minerals, prednisone, senokot, lokelma, thiamine, 250 mg Vit C, zinc sulfate 220 mg Labs: Phosphorus 5.1  CBG 117-218  Diet Order: DYS 3, thin liquids   EDUCATION NEEDS:   Not appropriate for education at this time  Skin:  Skin Assessment: Skin Integrity Issues: Skin Integrity Issues:: Other (Comment), Unstageable Unstageable: left heel Incision: R leg Other: MASD- groin, buttocks, thigh  Last BM:  8/15  Height:   Ht Readings from Last 1 Encounters:  05/11/20 5\' 9"  (1.753 m)    Weight:   Wt Readings from Last 1 Encounters:  05/18/20 70.3 kg    Ideal Body Weight:  65.9 kg  BMI:  Body mass index is 22.89 kg/m.  Estimated Nutritional Needs:   Kcal:  2100-2400 kcals  Protein:  110-130 grams  Fluid:  >/= 2.0 L   Mariana Single RD, LDN Clinical Nutrition Pager listed in [place]

## 2020-05-18 NOTE — Progress Notes (Signed)
Palliative Medicine Inpatient Follow Up Note  Reason for consult:  Goals of Care  HPI:  Per intake H&P --> 59 year old lady with prior history of diabetes, hypertension, paroxysmal atrial fibrillation on Eliquis, hyperlipidemia, end-stage renal disease s/p failed kidney transplant in 2013 and repeat renal transplant in September 2020, stage IV CKD, anemia of chronic disease, history of gastric sleeve surgery, hypoalbuminemia, right foot ulcer, chronic neuropathy presents with right foot ulcer, fevers. X-rays of the foot did not show any evidence of osteomyelitis she was started on IV antibiotics and fluids.  Palliative care was consulted for goals of care conversations in the setting of poor oral intake and muscular deconditioning.   Today's Discussion (05/17/2020): Chart reviewed. I met with Wanda [name] at bedside this morning. She shares that she had a long talk with her husband, Wanda [name] last night. He had stated that she "wants to die and go on hospice." Wanda [name] shares that she told him this is not the case and presently she wants to continue with the path she is on. She expresses that it has been stressful trying to provide him relief during this time. I stated to her that we will continue doing what we are doing to offer curative therapies until she states that she no longer wishes for these.   I told Wanda [name] that if at any point in time she does decide upon hospice that we are happy to support her in this decision and offer education to she and her family about what this would look like.   Wanda [name] again states that she would like to continue with present aggressive interventions and we discussed that this is certainly what the medical team will respect and do.  From a nutrition standpoint she ate 25-50% of offered diet yesterday, she has been able to move her bowels, (+) intermittent pain in her back thought to be r/t immobility provided oxycodone this morning w/ some relief.   Discussed  the importance of continued conversation with family and their  medical providers regarding overall plan of care and treatment options, ensuring decisions are within the context of the patients values and GOCs.  Questions and concerns addressed   Decision Maker: Wanda [name] (Husband) [phone]  SUMMARY OF RECOMMENDATIONS Full code / Full scope    Family would be agreeable to a long term G-tube for supplemental nutrition, though this may be complicated by her prior gastric sleeve procedure  Spiritual Support  Ongoing PMT Support  PMT will remain peripherally involved given the present goals of the patient - Please contact the team with any urgent needs  Code Status/Advance Care Planning: FULL CODE  Symptom Management: Failure to Thrive:                 - Dietician involved                 - On Coretrack feedings - Family would be interested a G-Tube                 - Supplemental nutrients  Pain, generalized:                 - Tyelnol 617m Q6H PRN                 - Oxycodone 521mPO Q4H PRN                 - Fentanyl 12.5-50mcg Q2H PRN - Please given instead of morphine given CKD  - Morphine IV  DC'd                 - Turn Q2H  Muscular Deconditioning:                 - PT/OT  Time Spent: 25 Greater than 50% of the time was spent in counseling and coordination of care ______________________________________________________________________________________ [place] Team Team Cell Phone: [phone] Please utilize secure chat with additional questions, if there is no response within 30 minutes please call the above phone number  Palliative Medicine Team providers are available by phone from 7am to 7pm daily and can be reached through the team cell phone.  Should this patient require assistance outside of these hours, please call the patient's attending physician.

## 2020-05-18 NOTE — Consult Note (Signed)
[name]  Wanda [name] Aug 02, 1961  [id number].    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 She did ok postop then again developed fevers 05/15/20 and was found to have klebsiella aerogenes bacteremia with unclear source but is suspected to be from UTI. ID is following and she is currently on meropenem.  Since admission patient has had worsening failure to thrive. She and her family met with palliative care and currently desire full code/ full scope care.  She is currently on dysphagia 3 diet and tolerating 25-50% of her meals. She is tolerating tube feedings via cortrak. Case was discussed with IR but she is not a candidate for percutaneous gastrostomy tube placement due to anatomy. General surgery asked to see the patient today for consideration of surgical G tube.  Abdominal surgical history: laparoscopic gastric sleeve 05/2015 Dr. Excell Seltzer, cholecystectomy, renal transplant  Review of Systems  Constitutional: Positive for malaise/fatigue and weight loss.  Respiratory: Negative.   Cardiovascular: Negative.   Gastrointestinal: Negative.   Musculoskeletal: Positive for joint pain.   All systems reviewed and otherwise negative except for as above  Family History  Problem Relation Age of Onset  . Colon cancer Father        deceased at age 34  . Diabetes Brother   . Neuropathy Neg Hx     Past Medical History:  Diagnosis Date  . Anemia   . Arthritis    "hands" (07/24/2013) pt denies  . Chronic kidney disease, stage IV (severe) ([place])    "never went on dialysis" (07/24/2013)  . Diabetes mellitus type II   . Diabetic peripheral neuropathy  ([place])   . Dialysis patient [facility])    Monday Wednesday Friday   . Dyspnea on exertion    pt denies  . Foot pain   . GERD (gastroesophageal reflux disease)   . Gout   . History of chicken pox   . History of measles   . History of mumps   . Hyperlipidemia   . Hypothyroidism   . Migraines    "q 3 months" (07/24/2013)  . Nonspecific abnormal unspecified cardiovascular function study   . Obesity   . PAD (peripheral artery disease) ([place])   . Peripheral neuropathy   . Peripheral vascular disease ([place])   . Pneumonia 09/2012  . Renal transplant disorder   . Shingles   . Sleep apnea    "getting ready to get tested again cause dr says I have it" (07/24/2013) pt states was tested and was told she did not have sleep apnea / no cpap  . Unspecified essential hypertension     Past Surgical History:  Procedure Laterality Date  . AMPUTATION Right 05/07/2020   Procedure: AMPUTATION BELOW KNEE;  Surgeon: [name], MD;  Location: [place];  Service: Orthopedics;  Laterality: Right;  . AV FISTULA PLACEMENT Right 2000   "lower arm" (07/24/2013)  . AV FISTULA PLACEMENT, RADIOCEPHALIC Right 8657   "upper arm" (07/24/2013)  . CAPD REMOVAL N/A 10/10/2016   Procedure: CONTINUOUS AMBULATORY PERITONEAL DIALYSIS  (CAPD) CATHETER REMOVAL;  Surgeon: Donnie Mesa, MD;  Location: [place];  Service: General;  Laterality: N/A;  . CATARACT EXTRACTION W/ INTRAOCULAR  LENS  IMPLANT, BILATERAL Bilateral 2011  . [place]; 1987  . CHOLECYSTECTOMY  2011  . COLONOSCOPY    . ENDOMETRIAL CRYOABLATION  1980's  . INTRAUTERINE DEVICE (IUD) INSERTION  2011  . LAPAROSCOPIC GASTRIC SLEEVE RESECTION N/A 05/26/2015   Procedure: LAPAROSCOPIC GASTRIC SLEEVE RESECTION;  Surgeon: Excell Seltzer, MD;  Location: [facility];  Service: General;  Laterality: N/A;  . TRANSPLANTATION RENAL  2013   "right" (09/04/2012)  . TUBAL LIGATION  1987  . [place]   "RLE; stripped" (09/04/2012)  . VIDEO BRONCHOSCOPY   09/07/2012   Procedure: VIDEO BRONCHOSCOPY WITHOUT FLUORO;  Surgeon: Chesley Mires, MD;  Location: [facility] ENDOSCOPY;  Service: Cardiopulmonary;  Laterality: Bilateral;    Social History:  reports that she has never smoked. She has never used smokeless tobacco. She reports that she does not drink alcohol and does not use drugs.  Allergies:  Allergies  Allergen Reactions  . Propoxyphene N-Acetaminophen Other (See Comments)    Severe headache  . Hydrocodone-Acetaminophen Other (See Comments)     Severe headache   . Astemizole Rash and Other (See Comments)    headache   . Cephalosporins Rash    Medications Prior to Admission  Medication Sig Dispense Refill  . amiodarone (PACERONE) 200 MG tablet Take 200 mg by mouth daily.    [name] Kitchen apixaban (ELIQUIS) 5 MG TABS tablet Take 5 mg by mouth every 12 (twelve) hours.    . Cholecalciferol (VITAMIN D-3) 25 MCG (1000 UT) CAPS Take 1,000 Units by mouth daily.    . Ensure (ENSURE) Take 237 mLs by mouth 2 (two) times daily as needed (meal supplement).    . folic acid (FOLVITE) 1 MG tablet Take 1 mg by mouth daily.    . furosemide (LASIX) 20 MG tablet Take 20 mg by mouth daily as needed for fluid or edema.     . hydrocortisone (ANUSOL-HC) 2.5 % rectal cream Place rectally 3 (three) times daily. (Patient taking differently: Place 1 application rectally 3 (three) times daily as needed for hemorrhoids. ) 30 g 0  . megestrol (MEGACE) 40 MG/ML suspension Take 400 mg by mouth daily.    . methenamine (HIPREX) 1 g tablet Take 1 g by mouth every 12 (twelve) hours. For UTI prevention    . Multiple Vitamin (MULTIVITAMIN WITH MINERALS) TABS tablet Take 1 tablet by mouth daily.    . nortriptyline (PAMELOR) 25 MG capsule TAKE 1 CAPSULE (25 MG TOTAL) BY MOUTH AT BEDTIME. 90 capsule 2  . pantoprazole (PROTONIX) 40 MG tablet Take 40 mg by mouth daily.  3  . Patiromer Sorbitex Calcium (VELTASSA) 16.8 g PACK Take 16.8 mg by mouth daily. Mix in orange juice and drink    .  predniSONE (DELTASONE) 5 MG tablet Take 10 mg daily for 3 days and then resume 5 mg daily dose (Patient taking differently: Take 5 mg by mouth daily. ) 30 tablet 0  . ramelteon (ROZEREM) 8 MG tablet Take 8 mg by mouth at bedtime.    . tacrolimus (PROGRAF) 1 MG capsule Take 3 mg by mouth every 12 (twelve) hours.    . thiamine 100 MG tablet Take 100 mg by mouth daily.    . traMADol (ULTRAM) 50 MG tablet Take 50 mg by mouth 2 (two) times daily.     . traZODone (DESYREL) 50 MG tablet Take 50 mg by mouth at bedtime.     [name] Kitchen atorvastatin (LIPITOR) 20 MG tablet Take 20 mg by mouth  at bedtime. (Patient not taking: Reported on 05/01/2020)    . dicyclomine (BENTYL) 10 MG capsule Take 10 mg by mouth 3 (three) times daily as needed for spasms. (Patient not taking: Reported on 05/01/2020)    . famotidine (PEPCID) 20 MG tablet Take 20 mg by mouth daily as needed for heartburn. (Patient not taking: Reported on 05/01/2020)  5  . pregabalin (LYRICA) 25 MG capsule Take 1 capsule (25 mg total) by mouth daily. (Patient not taking: Reported on 05/01/2020) 30 capsule 0  . pyridOXINE (B-6) 50 MG tablet Take 50 mg by mouth daily. (Patient not taking: Reported on 05/01/2020)    . tacrolimus (PROGRAF) 0.5 MG capsule Take 1.5 mg by mouth every 12 (twelve) hours. (Patient not taking: Reported on 05/01/2020)    . valGANciclovir (VALCYTE) 450 MG tablet Take 450 mg by mouth daily. (Patient not taking: Reported on 05/01/2020)      Prior to Admission medications   Medication Sig Start Date End Date Taking? Authorizing Provider  amiodarone (PACERONE) 200 MG tablet Take 200 mg by mouth daily.   Yes [provider]  apixaban (ELIQUIS) 5 MG TABS tablet Take 5 mg by mouth every 12 (twelve) hours.   Yes [provider]  Cholecalciferol (VITAMIN D-3) 25 MCG (1000 UT) CAPS Take 1,000 Units by mouth daily.   Yes [provider]  Ensure (ENSURE) Take 237 mLs by mouth 2 (two) times daily as needed (meal supplement).   Yes  [provider]  folic acid (FOLVITE) 1 MG tablet Take 1 mg by mouth daily.   Yes [provider]  furosemide (LASIX) 20 MG tablet Take 20 mg by mouth daily as needed for fluid or edema.  04/18/20  Yes [provider]  hydrocortisone (ANUSOL-HC) 2.5 % rectal cream Place rectally 3 (three) times daily. Patient taking differently: Place 1 application rectally 3 (three) times daily as needed for hemorrhoids.  02/11/20  Yes Lavina Hamman, MD  megestrol (MEGACE) 40 MG/ML suspension Take 400 mg by mouth daily. 04/17/20  Yes [provider]  methenamine (HIPREX) 1 g tablet Take 1 g by mouth every 12 (twelve) hours. For UTI prevention 04/27/20  Yes [provider]  Multiple Vitamin (MULTIVITAMIN WITH MINERALS) TABS tablet Take 1 tablet by mouth daily.   Yes [provider]  nortriptyline (PAMELOR) 25 MG capsule TAKE 1 CAPSULE (25 MG TOTAL) BY MOUTH AT BEDTIME. 04/01/19  Yes Patel, Domenick Bookbinder, MD  pantoprazole (PROTONIX) 40 MG tablet Take 40 mg by mouth daily. 01/16/18  Yes [provider]  Patiromer Sorbitex Calcium (VELTASSA) 16.8 g PACK Take 16.8 mg by mouth daily. Mix in orange juice and drink   Yes [provider]  predniSONE (DELTASONE) 5 MG tablet Take 10 mg daily for 3 days and then resume 5 mg daily dose Patient taking differently: Take 5 mg by mouth daily.  02/11/20  Yes Lavina Hamman, MD  ramelteon (ROZEREM) 8 MG tablet Take 8 mg by mouth at bedtime.   Yes [provider]  tacrolimus (PROGRAF) 1 MG capsule Take 3 mg by mouth every 12 (twelve) hours.   Yes [provider]  thiamine 100 MG tablet Take 100 mg by mouth daily.   Yes [provider]  traMADol (ULTRAM) 50 MG tablet Take 50 mg by mouth 2 (two) times daily.  04/21/20  Yes [provider]  traZODone (DESYREL) 50 MG tablet Take 50 mg by mouth at bedtime.  04/18/20  Yes [provider]  atorvastatin (LIPITOR) 20 MG tablet Take 20  mg by mouth at bedtime. Patient not taking: Reported on 05/01/2020    [provider]  dicyclomine (BENTYL) 10 MG capsule Take 10 mg by mouth 3 (three) times daily as needed for spasms. Patient not taking: Reported on 05/01/2020    [provider]  famotidine (PEPCID) 20 MG tablet Take 20 mg by mouth daily as needed for heartburn. Patient not taking: Reported on 05/01/2020 06/30/18   [provider]  pregabalin (LYRICA) 25 MG capsule Take 1 capsule (25 mg total) by mouth daily. Patient not taking: Reported on 05/01/2020 02/11/20   Lavina Hamman, MD  pyridOXINE (B-6) 50 MG tablet Take 50 mg by mouth daily. Patient not taking: Reported on 05/01/2020    [provider]  tacrolimus (PROGRAF) 0.5 MG capsule Take 1.5 mg by mouth every 12 (twelve) hours. Patient not taking: Reported on 05/01/2020    [provider]  valGANciclovir (VALCYTE) 450 MG tablet Take 450 mg by mouth daily. Patient not taking: Reported on 05/01/2020    [provider]    Blood pressure (!) 80/32, pulse (!) 108, temperature 98.8 F (37.1 C), temperature source Oral, resp. rate 20, height '5\' 9"'  (1.753 m), weight 70.3 kg, SpO2 94 %. Physical Exam: General: pleasant, frail black female who is laying in bed in NAD HEENT: head is normocephalic, atraumatic.  Sclera are noninjected.  PERRL.  Ears and nose without any masses or lesions.  Mouth is pink and moist. Cortrak in nare. Poor dentition Heart: regular, rate, and rhythm.  Normal s1,s2. No obvious murmurs, gallops, or rubs noted.  Did not palpate left pedal pulse Lungs: CTAB, no wheezes, rhonchi, or rales noted.  Respiratory effort nonlabored Abd: well healed laparoscopic as well as large RLQ incisions, soft, NT/ND, +BS, no masses, hernias, or organomegaly MS: s/p right BKA, LLE with cdi dressing in place Skin: warm and dry with no masses, lesions, or rashes Psych: A&Ox3 Neuro: cranial nerves grossly intact, moving all 4  extremities, normal speech, thought process intact  Results for orders placed or performed during the hospital encounter of 05/01/20 (from the past 48 hour(s))  Procalcitonin - Baseline     Status: None   Collection Time: 05/16/20  2:58 PM  Result Value Ref Range   Procalcitonin 3.05 ng/mL    Comment:        Interpretation: PCT > 2 ng/mL: Systemic infection (sepsis) is likely, unless other causes are known. (NOTE)       Sepsis PCT Algorithm           Lower Respiratory Tract                                      Infection PCT Algorithm    ----------------------------     ----------------------------         PCT < 0.25 ng/mL                PCT < 0.10 ng/mL          Strongly encourage             Strongly discourage   discontinuation of antibiotics    initiation of antibiotics    ----------------------------     -----------------------------       PCT 0.25 - 0.50 ng/mL            PCT 0.10 -  0.25 ng/mL               OR       >80% decrease in PCT            Discourage initiation of                                            antibiotics      Encourage discontinuation           of antibiotics    ----------------------------     -----------------------------         PCT >= 0.50 ng/mL              PCT 0.26 - 0.50 ng/mL               AND       <80% decrease in PCT              Encourage initiation of                                             antibiotics       Encourage continuation           of antibiotics    ----------------------------     -----------------------------        PCT >= 0.50 ng/mL                  PCT > 0.50 ng/mL               AND         increase in PCT                  Strongly encourage                                      initiation of antibiotics    Strongly encourage escalation           of antibiotics                                     -----------------------------                                           PCT <= 0.25 ng/mL                                                  OR                                        > 80% decrease in PCT  Discontinue / Do not initiate                                             antibiotics  Performed at [facility], [place] [address], [place], [place] [address]   Glucose, capillary     Status: None   Collection Time: 05/16/20  3:28 PM  Result Value Ref Range   Glucose-Capillary 75 70 - 99 mg/dL    Comment: Glucose reference range applies only to samples taken after fasting for at least 8 hours.  Glucose, capillary     Status: None   Collection Time: 05/16/20  4:33 PM  Result Value Ref Range   Glucose-Capillary 92 70 - 99 mg/dL    Comment: Glucose reference range applies only to samples taken after fasting for at least 8 hours.  Glucose, capillary     Status: Abnormal   Collection Time: 05/16/20  8:34 PM  Result Value Ref Range   Glucose-Capillary 162 (H) 70 - 99 mg/dL    Comment: Glucose reference range applies only to samples taken after fasting for at least 8 hours.  Glucose, capillary     Status: Abnormal   Collection Time: 05/16/20 10:48 PM  Result Value Ref Range   Glucose-Capillary 183 (H) 70 - 99 mg/dL    Comment: Glucose reference range applies only to samples taken after fasting for at least 8 hours.  Glucose, capillary     Status: Abnormal   Collection Time: 05/16/20 11:53 PM  Result Value Ref Range   Glucose-Capillary 155 (H) 70 - 99 mg/dL    Comment: Glucose reference range applies only to samples taken after fasting for at least 8 hours.  Glucose, capillary     Status: Abnormal   Collection Time: 05/17/20  4:14 AM  Result Value Ref Range   Glucose-Capillary 144 (H) 70 - 99 mg/dL    Comment: Glucose reference range applies only to samples taken after fasting for at least 8 hours.  CBC with Differential/Platelet     Status: Abnormal   Collection Time: 05/17/20  4:41 AM  Result Value Ref Range   WBC 7.8 4.0 - 10.5 K/uL   RBC 2.55 (L) 3.87 - 5.11  MIL/uL   Hemoglobin 7.9 (L) 12.0 - 15.0 g/dL   HCT 25.0 (L) 36 - 46 %   MCV 98.0 80.0 - 100.0 fL   MCH 31.0 26.0 - 34.0 pg   MCHC 31.6 30.0 - 36.0 g/dL   RDW 19.1 (H) 11.5 - 15.5 %   Platelets 275 150 - 400 K/uL   nRBC 0.0 0.0 - 0.2 %   Neutrophils Relative % 75 %   Neutro Abs 5.8 1.7 - 7.7 K/uL   Lymphocytes Relative 19 %   Lymphs Abs 1.5 0.7 - 4.0 K/uL   Monocytes Relative 4 %   Monocytes Absolute 0.3 0 - 1 K/uL   Eosinophils Relative 1 %   Eosinophils Absolute 0.1 0 - 0 K/uL   Basophils Relative 0 %   Basophils Absolute 0.0 0 - 0 K/uL   WBC Morphology DOHLE BODIES    Immature Granulocytes 1 %   Abs Immature Granulocytes 0.10 (H) 0.00 - 0.07 K/uL   Polychromasia PRESENT     Comment: Performed at [facility], [address] [place], [place] [address]  Magnesium     Status: None  Collection Time: 05/17/20  4:41 AM  Result Value Ref Range   Magnesium 2.2 1.7 - 2.4 mg/dL    Comment: Performed at [facility], [place] [address], [place], [place] [address]  C-reactive protein     Status: Abnormal   Collection Time: 05/17/20  4:41 AM  Result Value Ref Range   CRP 13.3 (H) <1.0 mg/dL    Comment: Performed at [facility] [address], [place], [place] [address]  Renal function panel     Status: Abnormal   Collection Time: 05/17/20  4:41 AM  Result Value Ref Range   Sodium 135 135 - 145 mmol/L   Potassium 4.8 3.5 - 5.1 mmol/L   Chloride 102 98 - 111 mmol/L   CO2 22 22 - 32 mmol/L   Glucose, Bld 167 (H) 70 - 99 mg/dL    Comment: Glucose reference range applies only to samples taken after fasting for at least 8 hours.   BUN 99 (H) 6 - 20 mg/dL   Creatinine, Ser 2.55 (H) 0.44 - 1.00 mg/dL   Calcium 8.6 (L) 8.9 - 10.3 mg/dL   Phosphorus 4.2 2.5 - 4.6 mg/dL   Albumin 1.2 (L) 3.5 - 5.0 g/dL   GFR calc non Af Amer 20 (L) >60 mL/min   GFR calc Af Amer 23 (L) >60 mL/min   Anion gap 11 5 - 15    Comment: Performed at [facility] [address]  [place], [place] [address]  Procalcitonin     Status: None   Collection Time: 05/17/20  4:41 AM  Result Value Ref Range   Procalcitonin 4.19 ng/mL    Comment:        Interpretation: PCT > 2 ng/mL: Systemic infection (sepsis) is likely, unless other causes are known. (NOTE)       Sepsis PCT Algorithm           Lower Respiratory Tract                                      Infection PCT Algorithm    ----------------------------     ----------------------------         PCT < 0.25 ng/mL                PCT < 0.10 ng/mL          Strongly encourage             Strongly discourage   discontinuation of antibiotics    initiation of antibiotics    ----------------------------     -----------------------------       PCT 0.25 - 0.50 ng/mL            PCT 0.10 - 0.25 ng/mL               OR       >80% decrease in PCT            Discourage initiation of                                            antibiotics      Encourage discontinuation           of antibiotics    ----------------------------     -----------------------------  PCT >= 0.50 ng/mL              PCT 0.26 - 0.50 ng/mL               AND       <80% decrease in PCT              Encourage initiation of                                             antibiotics       Encourage continuation           of antibiotics    ----------------------------     -----------------------------        PCT >= 0.50 ng/mL                  PCT > 0.50 ng/mL               AND         increase in PCT                  Strongly encourage                                      initiation of antibiotics    Strongly encourage escalation           of antibiotics                                     -----------------------------                                           PCT <= 0.25 ng/mL                                                 OR                                        > 80% decrease in PCT                                      Discontinue / Do not initiate                                              antibiotics  Performed at [facility], [address] [place], [place] [address]   Glucose, capillary     Status: Abnormal   Collection Time: 05/17/20  6:18 AM  Result Value Ref Range   Glucose-Capillary 147 (H) 70 - 99 mg/dL    Comment: Glucose reference range applies only to samples taken after fasting for at least 8  hours.  Glucose, capillary     Status: Abnormal   Collection Time: 05/17/20  8:04 AM  Result Value Ref Range   Glucose-Capillary 145 (H) 70 - 99 mg/dL    Comment: Glucose reference range applies only to samples taken after fasting for at least 8 hours.  Glucose, capillary     Status: Abnormal   Collection Time: 05/17/20 12:16 PM  Result Value Ref Range   Glucose-Capillary 126 (H) 70 - 99 mg/dL    Comment: Glucose reference range applies only to samples taken after fasting for at least 8 hours.  Glucose, capillary     Status: None   Collection Time: 05/17/20  5:29 PM  Result Value Ref Range   Glucose-Capillary 96 70 - 99 mg/dL    Comment: Glucose reference range applies only to samples taken after fasting for at least 8 hours.  Urinalysis, Routine w reflex microscopic Urine, Clean Catch     Status: Abnormal   Collection Time: 05/17/20  6:46 PM  Result Value Ref Range   Color, Urine AMBER (A) YELLOW    Comment: BIOCHEMICALS MAY BE AFFECTED BY COLOR   APPearance CLOUDY (A) CLEAR   Specific Gravity, Urine 1.012 1.005 - 1.030   pH 7.0 5.0 - 8.0   Glucose, UA 50 (A) NEGATIVE mg/dL   Hgb urine dipstick MODERATE (A) NEGATIVE   Bilirubin Urine NEGATIVE NEGATIVE   Ketones, ur NEGATIVE NEGATIVE mg/dL   Protein, ur 30 (A) NEGATIVE mg/dL   Nitrite NEGATIVE NEGATIVE   Leukocytes,Ua LARGE (A) NEGATIVE   RBC / HPF 21-50 0 - 5 RBC/hpf   WBC, UA >50 (H) 0 - 5 WBC/hpf   Bacteria, UA RARE (A) NONE SEEN   Squamous Epithelial / LPF 0-5 0 - 5   WBC Clumps PRESENT    Mucus PRESENT    Budding Yeast PRESENT    Hyphae Yeast PRESENT      Comment: Performed at [facility], [address] [place], [place] [address]  Glucose, capillary     Status: Abnormal   Collection Time: 05/17/20  8:01 PM  Result Value Ref Range   Glucose-Capillary 204 (H) 70 - 99 mg/dL    Comment: Glucose reference range applies only to samples taken after fasting for at least 8 hours.  Glucose, capillary     Status: Abnormal   Collection Time: 05/17/20  9:50 PM  Result Value Ref Range   Glucose-Capillary 199 (H) 70 - 99 mg/dL    Comment: Glucose reference range applies only to samples taken after fasting for at least 8 hours.  Glucose, capillary     Status: Abnormal   Collection Time: 05/18/20 12:35 AM  Result Value Ref Range   Glucose-Capillary 186 (H) 70 - 99 mg/dL    Comment: Glucose reference range applies only to samples taken after fasting for at least 8 hours.  CBC with Differential/Platelet     Status: Abnormal   Collection Time: 05/18/20  1:36 AM  Result Value Ref Range   WBC 6.0 4.0 - 10.5 K/uL   RBC 2.37 (L) 3.87 - 5.11 MIL/uL   Hemoglobin 7.1 (L) 12.0 - 15.0 g/dL   HCT 23.0 (L) 36 - 46 %   MCV 97.0 80.0 - 100.0 fL   MCH 30.0 26.0 - 34.0 pg   MCHC 30.9 30.0 - 36.0 g/dL   RDW 18.9 (H) 11.5 - 15.5 %   Platelets 255 150 - 400 K/uL   nRBC 0.0 0.0 - 0.2 %  Neutrophils Relative % 74 %   Neutro Abs 4.4 1.7 - 7.7 K/uL   Lymphocytes Relative 21 %   Lymphs Abs 1.3 0.7 - 4.0 K/uL   Monocytes Relative 3 %   Monocytes Absolute 0.2 0 - 1 K/uL   Eosinophils Relative 1 %   Eosinophils Absolute 0.1 0 - 0 K/uL   Basophils Relative 0 %   Basophils Absolute 0.0 0 - 0 K/uL   Immature Granulocytes 1 %   Abs Immature Granulocytes 0.08 (H) 0.00 - 0.07 K/uL    Comment: Performed at [facility] [address], [place], [place] [address]  Magnesium     Status: None   Collection Time: 05/18/20  1:36 AM  Result Value Ref Range   Magnesium 2.2 1.7 - 2.4 mg/dL    Comment: Performed at [facility] [address],  [place], [place] [address]  C-reactive protein     Status: Abnormal   Collection Time: 05/18/20  1:36 AM  Result Value Ref Range   CRP 13.4 (H) <1.0 mg/dL    Comment: Performed at [facility] [address] [place], [place] [address]  Procalcitonin     Status: None   Collection Time: 05/18/20  1:36 AM  Result Value Ref Range   Procalcitonin 5.18 ng/mL    Comment:        Interpretation: PCT > 2 ng/mL: Systemic infection (sepsis) is likely, unless other causes are known. (NOTE)       Sepsis PCT Algorithm           Lower Respiratory Tract                                      Infection PCT Algorithm    ----------------------------     ----------------------------         PCT < 0.25 ng/mL                PCT < 0.10 ng/mL          Strongly encourage             Strongly discourage   discontinuation of antibiotics    initiation of antibiotics    ----------------------------     -----------------------------       PCT 0.25 - 0.50 ng/mL            PCT 0.10 - 0.25 ng/mL               OR       >80% decrease in PCT            Discourage initiation of                                            antibiotics      Encourage discontinuation           of antibiotics    ----------------------------     -----------------------------         PCT >= 0.50 ng/mL              PCT 0.26 - 0.50 ng/mL               AND       <80% decrease in PCT  Encourage initiation of                                             antibiotics       Encourage continuation           of antibiotics    ----------------------------     -----------------------------        PCT >= 0.50 ng/mL                  PCT > 0.50 ng/mL               AND         increase in PCT                  Strongly encourage                                      initiation of antibiotics    Strongly encourage escalation           of antibiotics                                     -----------------------------                                            PCT <= 0.25 ng/mL                                                 OR                                        > 80% decrease in PCT                                      Discontinue / Do not initiate                                             antibiotics  Performed at [facility], [address], [place], [place] [address]   Renal function panel     Status: Abnormal   Collection Time: 05/18/20  1:36 AM  Result Value Ref Range   Sodium 136 135 - 145 mmol/L   Potassium 4.3 3.5 - 5.1 mmol/L   Chloride 102 98 - 111 mmol/L   CO2 21 (L) 22 - 32 mmol/L   Glucose, Bld 170 (H) 70 - 99 mg/dL    Comment: Glucose reference range applies only to samples taken after fasting for at least 8 hours.   BUN 98 (H) 6 - 20 mg/dL   Creatinine, Ser 2.54 (H) 0.44 - 1.00 mg/dL   Calcium 8.4 (L)  8.9 - 10.3 mg/dL   Phosphorus 5.1 (H) 2.5 - 4.6 mg/dL   Albumin 1.1 (L) 3.5 - 5.0 g/dL   GFR calc non Af Amer 20 (L) >60 mL/min   GFR calc Af Amer 23 (L) >60 mL/min   Anion gap 13 5 - 15    Comment: Performed at [facility] [address], [place], [place] [address]  Glucose, capillary     Status: Abnormal   Collection Time: 05/18/20  3:54 AM  Result Value Ref Range   Glucose-Capillary 176 (H) 70 - 99 mg/dL    Comment: Glucose reference range applies only to samples taken after fasting for at least 8 hours.  Glucose, capillary     Status: Abnormal   Collection Time: 05/18/20  6:07 AM  Result Value Ref Range   Glucose-Capillary 218 (H) 70 - 99 mg/dL    Comment: Glucose reference range applies only to samples taken after fasting for at least 8 hours.  Glucose, capillary     Status: Abnormal   Collection Time: 05/18/20  8:06 AM  Result Value Ref Range   Glucose-Capillary 183 (H) 70 - 99 mg/dL    Comment: Glucose reference range applies only to samples taken after fasting for at least 8 hours.   Comment 1 Notify RN   Glucose, capillary     Status: Abnormal   Collection Time: 05/18/20  11:40 AM  Result Value Ref Range   Glucose-Capillary 117 (H) 70 - 99 mg/dL    Comment: Glucose reference range applies only to samples taken after fasting for at least 8 hours.   Comment 1 Notify RN    CT ABDOMEN WO CONTRAST  Result Date: 05/17/2020 CLINICAL DATA:  Poor oral intake and malnutrition. Evaluation for possible gastrostomy tube placement. EXAM: CT ABDOMEN WITHOUT CONTRAST TECHNIQUE: Multidetector CT imaging of the abdomen was performed following the standard protocol without IV contrast. COMPARISON:  02/04/2020 FINDINGS: Lower chest: Elevation of the left hemidiaphragm with small amount of left pleural fluid. Trace right pleural effusion with atelectasis at the posterior right lung base. Hepatobiliary: No focal liver abnormality is seen. Status post cholecystectomy. No biliary dilatation. Pancreas: Unremarkable. No pancreatic ductal dilatation or surrounding inflammatory changes. Spleen: Normal in size without focal abnormality. Adrenals/Urinary Tract: Atrophic bilateral kidneys without evidence of hydronephrosis or focal lesions by unenhanced CT. No adrenal masses. Stomach/Bowel: Feeding tube present extending into the proximal duodenum. There is evidence of prior gastric surgery consistent with gastric sleeve procedure. No evidence of bowel obstruction, significant ileus or free intraperitoneal air. Vascular/Lymphatic: Aortic atherosclerosis without aneurysm. No enlarged abdominal lymph nodes. Other: No ascites or focal fluid collections. No abdominal hernias identified. Musculoskeletal: No acute or significant osseous findings. IMPRESSION: 1. No acute findings in the abdomen or pelvis. 2. Elevation of the left hemidiaphragm with small amount of left pleural fluid. 3. Trace right pleural effusion with atelectasis at the posterior right lung base. 4. Atrophic bilateral kidneys. 5. Aortic atherosclerosis without aneurysm. 6. Status post gastric sleeve procedure. For this reason the patient is not  a candidate for percutaneous gastrostomy tube placement due to possible inadequate ability to insufflate the stomach with air after prior surgery as well as higher risk of complication after prior gastric sleeve. Aortic Atherosclerosis (ICD10-I70.0). Electronically Signed   By: Aletta Edouard M.D.   On: 05/17/2020 08:35   CT HEAD WO CONTRAST  Result Date: 05/17/2020 CLINICAL DATA:  Neuro deficit EXAM: CT HEAD WITHOUT CONTRAST TECHNIQUE: Contiguous axial images were obtained from  the base of the skull through the vertex without intravenous contrast. COMPARISON:  10/16/2017 head CT and prior. FINDINGS: Brain: No acute infarct or intracranial hemorrhage. No mass lesion. No midline shift, ventriculomegaly or extra-axial fluid collection. Vascular: No hyperdense vessel. Bilateral skull base atherosclerotic calcifications. Skull: Negative for fracture or focal lesion. Sinuses/Orbits: Normal orbits. Minimal left maxillary sinus mucosal thickening. No mastoid effusion. Other: None. IMPRESSION: No acute intracranial process. Electronically Signed   By: Primitivo Gauze M.D.   On: 05/17/2020 18:05   MR FOOT LEFT WO CONTRAST  Result Date: 05/17/2020 CLINICAL DATA:  Chronic left foot pain EXAM: MRI OF THE LEFT FOOT WITHOUT CONTRAST TECHNIQUE: Multiplanar, multisequence MR imaging of the left was performed. No intravenous contrast was administered. COMPARISON:  None. FINDINGS: Bones/Joint/Cartilage No areas of cortical destruction or periosteal reaction are seen. There is minimally increased T2 hyperintense signal seen at the insertion site of the Achilles tendon with a small calcaneal enthesophyte. Normal osseous marrow signal seen throughout the remainder of the hindfoot. A small ankle joint effusion is present. The articular surfaces are maintained. Ligaments The Lisfranc ligaments are intact. Muscles and Tendons Mild fatty atrophy with increased signal seen throughout the muscles of the hindfoot. The visualized  flexor and extensor tendons are intact. There is mildly increased signal seen within the 1 cm segment of Achilles tendon at the insertion site. Soft tissues Area of superficial ulceration overlying the posterior medial calcaneus with heel pad inflammation. There is diffuse subcutaneous edema seen within the plantar surface. No loculated fluid collections or sinus tract. IMPRESSION: No evidence of osteomyelitis. Superficial ulceration over the posterior calcaneus with heel pad inflammation. No loculated fluid collections. Insertional Achilles tendinosis with mild reactive marrow. Electronically Signed   By: Prudencio Pair M.D.   On: 05/17/2020 19:40   DG Foot Complete Left  Result Date: 05/16/2020 CLINICAL DATA:  Left foot pain. EXAM: LEFT FOOT - COMPLETE 3+ VIEW COMPARISON:  05/01/2020 FINDINGS: Examination demonstrates no evidence of acute fracture or dislocation. Remaining bones and soft tissues otherwise unchanged. IMPRESSION: No acute findings. Electronically Signed   By: Marin Olp M.D.   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 Excell Seltzer   Failure to thrive Dysphagia - Request for surgical G tube placement since patient is not a candidate for IR percutaneous G tube. She is currently tolerating tube feedings via Cortrak, and is on a dysphagia 3 diet. Over the weekend she consumed 25-50% of her meals. She tells me today she is feeling somewhat better compared to the weekend and thinks she could eat more. She is not a very good surgical candidate given her multiple medical problems including malnutrition and immunosuppression on daily prednisone and prograf. I would recommend giving her more of a chance to tolerate enough by mouth. Will ask dietician to repeat calorie count, consider  turning tube feedings down vs bolus  at night to encourage more PO intake during the day. I will discuss starting appetite stimulant with primary team.  If we were to offer surgical G tube eliquis would need to be held for 48 hours, but I don't think this will be the case.  ID - currently meropenem 8/16>> VTE - SCDs, eliquis FEN - TF, dysphagia 3 diet Foley - none Follow up - TBD  Feiga Nadel A  [place], [facility] Surgery 05/18/2020, 1:48 PM Please see Amion for pager number during day hours 7:00am-4:30pm

## 2020-05-18 NOTE — Progress Notes (Signed)
Mobility Specialist: Progress Note    05/18/20 1711  Mobility  Activity  (Bed Exercises)  Assistive Device None  Mobility Response Tolerated fair  Mobility performed by Mobility specialist  Bed Position Semi-fowlers  $Mobility charge 1 Mobility   Pre-Mobility: 106 HR, 91/30 BP, 98% SpO2 Post-Mobility: 105 HR, 81/27 BP, 98% SpO2  Pt had no c/o of pain during bed exercises.   [facility] Kayvan Hoefling Mobility Specialist

## 2020-05-18 NOTE — Progress Notes (Signed)
[facility] for Infectious Disease  Date of Admission:  05/01/2020      Total days of antibiotics 3  Meropenem            ASSESSMENT: Wanda [name] is a 59 y.o. 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 She was treated with zosyn during this time frame until 8/10.  Hospital day 14 she had high fevers - blood cultures revealed klebsiella aerogenes bacteremia in 4/4 bottles.   Unclear where her bacteremia came from - her left heel is not open or draining, MRI w/o any osteomyelitis concern. Right stump surgical incision looks clean and dry without any concern for infection. AVG is unremarkable. CT scan of A/P unrevealing for any nidus.  ? Transient in the setting of diarrhea / bowel translocation with enteric feeds. She has gastric sleeve but seems the enteral tube is seated in good position.  No findings concerning for phlebitis from previous PIV sites, although has L PIV in place 16 days.   With susceptibility patterns indicating resistance to 1st and 3rd generation cephalosporins will transition her to meropenem IV daily. She continues to improve.   Would get a TTE to ensure nothing concerning on heart valves for completeness.    PLAN: 1. Check TTE 2. Change cefepime to meropenem  3. Consider removing the 18 day old PIV site   Principal Problem:   Septic shock ([place]) Active Problems:   DM (diabetes mellitus), type 2 with renal complications (HCC)   Essential hypertension   CHRONIC KIDNEY DISEASE STAGE IV ([facility])   Renal transplant disorder   Depression   History of kidney transplant   Wound infection   Leukocytosis   Pressure injury of skin   Palliative care by specialist   Goals of care, counseling/discussion   DNR (do not resuscitate) discussion   Failure to thrive in adult   Pain of foot   . sodium chloride   Intravenous Once  . sodium chloride   Intravenous Once  . amiodarone  200 mg Oral Daily    . Chlorhexidine Gluconate Cloth  6 each Topical Daily  . darbepoetin (ARANESP) injection - NON-DIALYSIS  150 mcg Subcutaneous Q Mon-1800  . feeding supplement (ENSURE ENLIVE)  237 mL Oral TID BM  . feeding supplement (NEPRO CARB STEADY)  910 mL Per Tube Q24H  . feeding supplement (PROSource TF)  90 mL Per Tube BID  . folic acid  1 mg Oral Daily  . insulin aspart  0-15 Units Subcutaneous TID WC  . insulin aspart  0-5 Units Subcutaneous QHS  . insulin aspart  4 Units Subcutaneous Q4H  . lidocaine  5 mL Intradermal Once  . multivitamin with minerals  1 tablet Oral Daily  . nortriptyline  25 mg Oral QHS  . pantoprazole  40 mg Oral Daily  . predniSONE  5 mg Oral Q breakfast  . senna-docusate  1 tablet Oral Daily  . sodium bicarbonate  650 mg Oral BID  . sodium zirconium cyclosilicate  10 g Oral BID  . tacrolimus  1 mg Oral BID  . thiamine  100 mg Oral Daily  . vitamin C  250 mg Oral BID  . zinc sulfate  220 mg Oral Daily    SUBJECTIVE: Feeling better than when she came in.  L ankle hurts.  No complaints otherwise today  Reports watery diarrhea the week before, 3-4 episodes a day but this has resolved  now.  No problems with the RUE graft site - cannulates well and no tenderness.    Review of Systems: Review of Systems  Constitutional: Negative for chills and fever.  Respiratory: Negative for cough and sputum production.   Cardiovascular: Negative for chest pain and leg swelling.  Gastrointestinal: Positive for diarrhea. Negative for abdominal pain and vomiting.  Genitourinary: Negative for dysuria.  Musculoskeletal: Positive for joint pain (L heel pain ). Negative for back pain and myalgias.  Neurological: Positive for weakness. Negative for dizziness and headaches.    Allergies  Allergen Reactions  . Propoxyphene N-Acetaminophen Other (See Comments)    Severe headache  . Hydrocodone-Acetaminophen Other (See Comments)     Severe headache   . Astemizole Rash and Other  (See Comments)    headache   . Cephalosporins Rash    OBJECTIVE: Vitals:   05/18/20 0404 05/18/20 0803 05/18/20 1136 05/18/20 1229  BP:  99/65 (!) 80/30 (!) 80/32  Pulse:  (!) 117 (!) 109 (!) 108  Resp:  18 (!) 21 20  Temp:  99 F (37.2 C) 98.7 F (37.1 C) 98.8 F (37.1 C)  TempSrc:  Oral Oral Oral  SpO2:  91% 92% 94%  Weight: 70.3 kg     Height:       Body mass index is 22.89 kg/m.  Physical Exam Vitals reviewed.  Constitutional:      Appearance: She is ill-appearing.     Comments: Sitting upright bed, no distress.   HENT:     Nose:     Comments: Enteric tube in place    Mouth/Throat:     Mouth: Mucous membranes are moist.     Pharynx: Oropharynx is clear.  Eyes:     General: No scleral icterus.    Pupils: Pupils are equal, round, and reactive to light.  Cardiovascular:     Rate and Rhythm: Normal rate and regular rhythm.     Heart sounds: No murmur heard.   Pulmonary:     Effort: Pulmonary effort is normal.     Breath sounds: Normal breath sounds. No rhonchi.  Abdominal:     General: Bowel sounds are normal. There is no distension.     Palpations: Abdomen is soft.     Tenderness: There is no abdominal tenderness.  Musculoskeletal:     Comments: RUE AVG +bruit/thrill, no tenderness or fluctuance/warmth  Skin:    General: Skin is warm and dry.     Capillary Refill: Capillary refill takes less than 2 seconds.     Findings: No rash.     Comments: R BKA stump appears clean. Dressing had old bloody drainage in place. Mepitel over staples with approximation of wound.  L heel with deep tissue injury, closed skin without any drainage.   Neurological:     Mental Status: She is alert and oriented to person, place, and time.     Lab Results Lab Results  Component Value Date   WBC 6.0 05/18/2020   HGB 7.1 (L) 05/18/2020   HCT 23.0 (L) 05/18/2020   MCV 97.0 05/18/2020   PLT 255 05/18/2020    Lab Results  Component Value Date   CREATININE 2.54 (H)  05/18/2020   BUN 98 (H) 05/18/2020   NA 136 05/18/2020   K 4.3 05/18/2020   CL 102 05/18/2020   CO2 21 (L) 05/18/2020    Lab Results  Component Value Date   ALT 10 05/02/2020   AST 21 05/02/2020   ALKPHOS 301 (H) 05/02/2020  BILITOT 0.9 05/02/2020     Microbiology: Recent Results (from the past 240 hour(s))  Culture, blood (Routine X 2) w Reflex to ID Panel     Status: Abnormal   Collection Time: 05/15/20 10:21 PM   Specimen: BLOOD LEFT HAND  Result Value Ref Range Status   Specimen Description BLOOD LEFT HAND  Final   Special Requests   Final    BOTTLES DRAWN AEROBIC AND ANAEROBIC Blood Culture adequate volume   Culture  Setup Time   Final    GRAM NEGATIVE RODS IN BOTH AEROBIC AND ANAEROBIC BOTTLES CRITICAL RESULT CALLED TO, READ BACK BY AND VERIFIED WITH: C. KO PHARMD, AT 1333 05/16/20 BY D. VANHOOK Performed at [facility], [place] [address], [place],  [address]    Culture ENTEROBACTER AEROGENES (A)  Final   Report Status 05/18/2020 FINAL  Final   Organism ID, Bacteria ENTEROBACTER AEROGENES  Final      Susceptibility   Enterobacter aerogenes - MIC*    CEFAZOLIN >=64 RESISTANT Resistant     CEFEPIME 1 SENSITIVE Sensitive     CEFTAZIDIME >=64 RESISTANT Resistant     CEFTRIAXONE >=64 RESISTANT Resistant     CIPROFLOXACIN <=0.25 SENSITIVE Sensitive     GENTAMICIN <=1 SENSITIVE Sensitive     IMIPENEM 0.5 SENSITIVE Sensitive     TRIMETH/SULFA <=20 SENSITIVE Sensitive     PIP/TAZO >=128 RESISTANT Resistant     * ENTEROBACTER AEROGENES  Blood Culture ID Panel (Reflexed)     Status: Abnormal   Collection Time: 05/15/20 10:21 PM  Result Value Ref Range Status   Enterococcus faecalis NOT DETECTED NOT DETECTED Final   Enterococcus Faecium NOT DETECTED NOT DETECTED Final   Listeria monocytogenes NOT DETECTED NOT DETECTED Final   Staphylococcus species NOT DETECTED NOT DETECTED Final   Staphylococcus aureus (BCID) NOT DETECTED NOT DETECTED Final   Staphylococcus  epidermidis NOT DETECTED NOT DETECTED Final   Staphylococcus lugdunensis NOT DETECTED NOT DETECTED Final   Streptococcus species NOT DETECTED NOT DETECTED Final   Streptococcus agalactiae NOT DETECTED NOT DETECTED Final   Streptococcus pneumoniae NOT DETECTED NOT DETECTED Final   Streptococcus pyogenes NOT DETECTED NOT DETECTED Final   A.calcoaceticus-baumannii NOT DETECTED NOT DETECTED Final   Bacteroides fragilis NOT DETECTED NOT DETECTED Final   Enterobacterales DETECTED (A) NOT DETECTED Final    Comment: Enterobacterales represent a large order of gram negative bacteria, not a single organism. CRITICAL RESULT CALLED TO, READ BACK BY AND VERIFIED WITH: C. KO PHARMD, AT 1333 05/16/20 BY D. VANHOOK    Enterobacter cloacae complex NOT DETECTED NOT DETECTED Final   Escherichia coli NOT DETECTED NOT DETECTED Final   Klebsiella aerogenes DETECTED (A) NOT DETECTED Final    Comment: CRITICAL RESULT CALLED TO, READ BACK BY AND VERIFIED WITH: C. KO PHARMD, AT 1333 05/16/20 BY D. VANHOOK    Klebsiella oxytoca NOT DETECTED NOT DETECTED Final   Klebsiella pneumoniae NOT DETECTED NOT DETECTED Final   Proteus species NOT DETECTED NOT DETECTED Final   Salmonella species NOT DETECTED NOT DETECTED Final   Serratia marcescens NOT DETECTED NOT DETECTED Final   Haemophilus influenzae NOT DETECTED NOT DETECTED Final   Neisseria meningitidis NOT DETECTED NOT DETECTED Final   Pseudomonas aeruginosa NOT DETECTED NOT DETECTED Final   Stenotrophomonas maltophilia NOT DETECTED NOT DETECTED Final   Candida albicans NOT DETECTED NOT DETECTED Final   Candida auris NOT DETECTED NOT DETECTED Final   Candida glabrata NOT DETECTED NOT DETECTED Final   Candida krusei  NOT DETECTED NOT DETECTED Final   Candida parapsilosis NOT DETECTED NOT DETECTED Final   Candida tropicalis NOT DETECTED NOT DETECTED Final   Cryptococcus neoformans/gattii NOT DETECTED NOT DETECTED Final   CTX-M ESBL NOT DETECTED NOT DETECTED Final    Carbapenem resistance IMP NOT DETECTED NOT DETECTED Final   Carbapenem resistance KPC NOT DETECTED NOT DETECTED Final   Carbapenem resistance NDM NOT DETECTED NOT DETECTED Final   Carbapenem resist OXA 48 LIKE NOT DETECTED NOT DETECTED Final   Carbapenem resistance VIM NOT DETECTED NOT DETECTED Final    Comment: Performed at [facility], [place] [address], [place], [place] [address]  Culture, blood (Routine X 2) w Reflex to ID Panel     Status: Abnormal   Collection Time: 05/15/20 10:35 PM   Specimen: BLOOD LEFT ARM  Result Value Ref Range Status   Specimen Description BLOOD LEFT ARM  Final   Special Requests   Final    BOTTLES DRAWN AEROBIC AND ANAEROBIC Blood Culture adequate volume   Culture  Setup Time   Final    GRAM NEGATIVE RODS IN BOTH AEROBIC AND ANAEROBIC BOTTLES CRITICAL VALUE NOTED.  VALUE IS CONSISTENT WITH PREVIOUSLY REPORTED AND CALLED VALUE.    Culture (A)  Final    ENTEROBACTER AEROGENES SUSCEPTIBILITIES PERFORMED ON PREVIOUS CULTURE WITHIN THE LAST 5 DAYS. Performed at [facility], [place] [address], [place], [place] [address]    Report Status 05/18/2020 FINAL  Final     Janene Madeira, MSN, NP-C [facility] Infectious Disease [email or web address] Pager: [phone] Office: [phone] [place]: [phone]

## 2020-05-18 NOTE — Progress Notes (Signed)
   05/18/20 1306  Clinical Encounter Type  Visited With Patient  Visit Type Initial;Spiritual support  Referral From Palliative care team  Consult/Referral To Chaplain  Spiritual Encounters  Spiritual Needs Prayer  This chaplain responded to PMT consult for spiritual care.  The chaplain introduced herself to the Pt. and the RN-Linh.  The chaplain listened to the Pt. describe her PT experience today. The Pt. remembers sitting on the side of the bed. From this point forward the Pt. shared she doesn't have a memory and is currently trying to discern the sequence of events.  This chaplain understands the Pt. will appreciate assistance from PT.  The Pt. describes her faith and relationship with God as covering her from her head to toe, filled with love and healing.   The Pt. finds comfort in the sunshine and requested the blinds to be open.  The Pt. is open to bedside prayer and continued prayer from the chaplain.

## 2020-05-18 NOTE — Progress Notes (Addendum)
ANTICOAGULATION CONSULT NOTE - Initial Consult  Pharmacy Consult for Apixaban to IV Heparin transition Indication: atrial fibrillation  Allergies  Allergen Reactions  . Propoxyphene N-Acetaminophen Other (See Comments)    Severe headache  . Hydrocodone-Acetaminophen Other (See Comments)     Severe headache   . Astemizole Rash and Other (See Comments)    headache   . Cephalosporins Rash    Patient Measurements: Height: 5\' 9"  (175.3 cm) Weight: 70.3 kg (155 lb) IBW/kg (Calculated) : 66.2 Heparin Dosing Weight: 70.3 kg  Vital Signs: Temp: 98.8 F (37.1 C) (08/16 1229) Temp Source: Oral (08/16 1229) BP: 80/32 (08/16 1229) Pulse Rate: 108 (08/16 1229)  Labs: Recent Labs    05/16/20 0500 05/16/20 0500 05/17/20 0441 05/18/20 0136  HGB 7.7*   < > 7.9* 7.1*  HCT 24.7*  --  25.0* 23.0*  PLT 223  --  275 255  CREATININE 2.54*  --  2.55* 2.54*   < > = values in this interval not displayed.    Estimated Creatinine Clearance: 24.9 mL/min (A) (by C-G formula based on SCr of 2.54 mg/dL (H)).   Medical History: Past Medical History:  Diagnosis Date  . Anemia   . Arthritis    "hands" (07/24/2013) pt denies  . Chronic kidney disease, stage IV (severe) ([place])    "never went on dialysis" (07/24/2013)  . Diabetes mellitus type II   . Diabetic peripheral neuropathy ([place])   . Dialysis patient [facility])    Monday Wednesday Friday   . Dyspnea on exertion    pt denies  . Foot pain   . GERD (gastroesophageal reflux disease)   . Gout   . History of chicken pox   . History of measles   . History of mumps   . Hyperlipidemia   . Hypothyroidism   . Migraines    "q 3 months" (07/24/2013)  . Nonspecific abnormal unspecified cardiovascular function study   . Obesity   . PAD (peripheral artery disease) ([place])   . Peripheral neuropathy   . Peripheral vascular disease ([place])   . Pneumonia 09/2012  . Renal transplant disorder   . Shingles   . Sleep apnea    "getting ready to get  tested again cause dr says I have it" (07/24/2013) pt states was tested and was told she did not have sleep apnea / no cpap  . Unspecified essential hypertension     Assessment: 59 years of age female on chronic Apixaban for history of atrial fibrillation now to be held for G-tube placement later this week. Pharmacy consulted to transition to IV Heparin therapy.   Last Apixaban dose given this AM at 09:02 AM. Will check a baseline aPTT (HL elevation expected with recent Apixaban so will follow aPTTs until levels are correlating). Will plan to start IV Heparin this PM -12 hours after last dose.   Goal of Therapy:  Heparin level 0.3-0.7 units/ml aPTT 66-102 seconds Monitor platelets by anticoagulation protocol: Yes   Plan:  Baseline aPTT now - if not prolonged, will plan to initiate IV Heparin (no bolus) at 1000 units/hr starting at 2100 PM.  aPTT in 8 hours after initiation.  Daily aPTT and Heparin level until levels are correlating.  Daily CBC while on therapy.  Monitor for any signs of bleeding.  Follow-up for G-tube placement and ability to resume Apixaban therapy.   Sloan Leiter, PharmD, BCPS, BCCCP Clinical Pharmacist Please refer to [facility] for [place] numbers 05/18/2020,12:55 PM  Addendum: Baseline aPTT slightly elevated  at 69 - likely secondary to kidney function.  Will start IV Heparin at lower rate of 800 units/hr tonight at 2100 PM and check aPTT in 8 hours.

## 2020-05-18 NOTE — Progress Notes (Signed)
Physical Therapy Treatment Patient Details Name: Wanda [name] MRN: [id number] DOB: 11/25/1960 Today's Date: 05/18/2020    History of Present Illness Wanda [name] is a 59 y.o. female with medical history significant of diabetes type 2, hypertension, paroxysmal A. fib, hyperlipidemia, ESRD with failed kidney transplant in 2013 and repeat transplant in September 2020 at [facility] with current chronic renal disease stage IV, anemia of chronic disease, history of gastric sleeve surgery, hypoalbuminemia, right foot ulcer, chronic neuropathy presented with worsening right foot ulcer drainage getting worse over the last 2 weeks.  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  Pt has had pallative consult and continues to want aggressive treatment.    PT Comments    Pt remains limited requiring total A of 2.  Performed bed exercises with max encouragement, increased time, and pt with limited assistance with LE exercises.  Pt continues to have flat affect and soft BP which RN reports is baseline and MD aware.  However,  Pt's O2 sats were low on RA at arrival and place on 2 LPM O2 - RN reports this is new and she will follow-up.      Follow Up Recommendations  SNF     Equipment Recommendations   (defer to SNF for further assessment -hoyer, w/c, hospital bed)    Recommendations for Other Services       Precautions / Restrictions Precautions Precautions: Fall Precaution Comments: new R BKA Required Braces or Orthoses: Other Brace Other Brace: limb protector Restrictions RLE Weight Bearing: Non weight bearing LLE Weight Bearing: Weight bearing as tolerated    Mobility  Bed Mobility Overal bed mobility: Needs Assistance Bed Mobility: Supine to Sit;Rolling;Sit to Supine Rolling: Total assist;+2 for physical assistance   Supine to sit: Total assist;+2 for physical assistance Sit to supine: Total assist;+2 for physical assistance   General bed mobility comments: Positioned extremeties to facilitate with transfers  but still required total A x 2  Transfers                 General transfer comment: deferred as pt with soft BP, tachycardia, low O2,c/o dizziness, and unable to assist  Ambulation/Gait                 Stairs             Wheelchair Mobility    Modified Rankin (Stroke Patients Only)       Balance Overall balance assessment: Needs assistance Sitting-balance support: Feet supported;Bilateral upper extremity supported Sitting balance-Leahy Scale: Zero Sitting balance - Comments: Sat at EOB for 3-5 minutes with total A.  Worked on leaning forward and trying to get balance but pt too weak and w/ c/o lightheadedness.  BP has been soft at baseline per RN.                                    Cognition Arousal/Alertness: Lethargic Behavior During Therapy: Flat affect Overall Cognitive Status: Impaired/Different from baseline Area of Impairment: Problem solving;Awareness;Safety/judgement;Memory;Following commands                     Memory: Decreased short-term memory Following Commands: Follows one step commands inconsistently;Follows one step commands with increased time Safety/Judgement: Decreased awareness of safety;Decreased awareness of deficits Awareness: Intellectual Problem Solving: Slow processing;Requires verbal cues;Decreased initiation;Difficulty sequencing General Comments: Pt with flat affect and not making eye contact.      Exercises General Exercises - Upper Extremity  Shoulder Flexion: AAROM;Both;10 reps;Supine Elbow Flexion: AAROM;Both;10 reps;Supine Elbow Extension: AAROM;Both;10 reps;Supine General Exercises - Lower Extremity Ankle Circles/Pumps: AAROM;Left;10 reps;Supine (knee in flexed position due to c/o pain) Short Arc Quad: AAROM;Right;10 reps;Supine Heel Slides: AAROM;10 reps;Both;Supine Hip ABduction/ADduction: AAROM;10 reps;Supine;Both    General Comments General comments (skin integrity, edema, etc.):    Pt with flat affect, does not make eye contact (gazes off), and keeps head turned to the right.  She could turn head and perform eye tracking with increased time.  RN reports this has been close to recent baseline.   HR ranged from 107-123 bpm  BP as follows: pre PT 83/32, supine 99/65, sitting 77/63, sitting 3 mins 82/27, return to supine 76/30 (notified RN who reports this has been baseline).    O2 sats were 82% on RA at arrival with good pleth.  Noted O2 turned on 2 LPM but [place] not on pt. Placed [place] and sats up to 91%, down to 86% with activity, then back to 89-91% rest.  Notified RN who reports pt has wore O2 at night but not during day and that this drop in O2 is new and she will f/u.      Pertinent Vitals/Pain Pain Assessment: No/denies pain    Home Living                      Prior Function            PT Goals (current goals can now be found in the care plan section) Acute Rehab PT Goals Patient Stated Goal: pt did not state PT Goal Formulation: With patient Time For Goal Achievement: 05/20/20 Potential to Achieve Goals: Fair Progress towards PT goals: Not progressing toward goals - comment (did set EOB but total A x2)    Frequency    Min 2X/week      PT Plan Current plan remains appropriate    Co-evaluation              AM-PAC PT "6 Clicks" Mobility   Outcome Measure  Help needed turning from your back to your side while in a flat bed without using bedrails?: Total Help needed moving from lying on your back to sitting on the side of a flat bed without using bedrails?: Total Help needed moving to and from a bed to a chair (including a wheelchair)?: Total Help needed standing up from a chair using your arms (e.g., wheelchair or bedside chair)?: Total Help needed to walk in hospital room?: Total Help needed climbing 3-5 steps with a railing? : Total 6 Click Score: 6    End of Session Equipment Utilized During Treatment: Oxygen Activity  Tolerance: Patient limited by fatigue (limited by fatigue, dizziness, and flat affect/participation) Patient left: in bed;with call bell/phone within reach Nurse Communication: Mobility status;Need for lift equipment;Other (comment) (low bp, flat affect, low O2) PT Visit Diagnosis: Muscle weakness (generalized) (M62.81);Other abnormalities of gait and mobility (R26.89)     Time: 2353-6144 PT Time Calculation (min) (ACUTE ONLY): 18 min  Charges:  $Therapeutic Exercise: 8-22 mins                     Abran Richard, PT Acute Rehab Services Pager [phone] [facility] [phone]     Karlton Lemon 05/18/2020, 1:09 PM

## 2020-05-19 ENCOUNTER — Encounter (HOSPITAL_COMMUNITY)
Admission: EM | Disposition: A | Discharge status: Skilled Nursing Facility | Payer: Self-pay | Source: Home / Self Care | Attending: Internal Medicine

## 2020-05-19 ENCOUNTER — Inpatient Hospital Stay (HOSPITAL_COMMUNITY): Payer: Medicare HMO

## 2020-05-19 ENCOUNTER — Inpatient Hospital Stay (HOSPITAL_COMMUNITY): Payer: Medicare HMO | Admitting: Certified Registered Nurse Anesthetist

## 2020-05-19 ENCOUNTER — Encounter (HOSPITAL_COMMUNITY): Payer: Self-pay | Admitting: Internal Medicine

## 2020-05-19 DIAGNOSIS — Z515 Encounter for palliative care: Secondary | ICD-10-CM

## 2020-05-19 DIAGNOSIS — M79673 Pain in unspecified foot: Secondary | ICD-10-CM

## 2020-05-19 DIAGNOSIS — R627 Adult failure to thrive: Secondary | ICD-10-CM

## 2020-05-19 DIAGNOSIS — Z7189 Other specified counseling: Secondary | ICD-10-CM

## 2020-05-19 LAB — TACROLIMUS LEVEL: Tacrolimus (FK506) - LabCorp: <1 ng/mL — ABNORMAL LOW (ref 2.0–20.0)

## 2020-05-19 LAB — CBC WITH DIFFERENTIAL/PLATELET
Abs Immature Granulocytes: 0 10*3/uL (ref 0.00–0.07)
Basophils Absolute: 0.1 10*3/uL (ref 0.0–0.1)
Basophils Relative: 2 %
Eosinophils Absolute: 0 10*3/uL (ref 0.0–0.5)
Eosinophils Relative: 0 %
HCT: 28.3 % — ABNORMAL LOW (ref 36.0–46.0)
Hemoglobin: 9 g/dL — ABNORMAL LOW (ref 12.0–15.0)
Lymphocytes Relative: 10 %
Lymphs Abs: 0.6 10*3/uL — ABNORMAL LOW (ref 0.7–4.0)
MCH: 30.2 pg (ref 26.0–34.0)
MCHC: 31.8 g/dL (ref 30.0–36.0)
MCV: 95 fL (ref 80.0–100.0)
Monocytes Absolute: 0.3 10*3/uL (ref 0.1–1.0)
Monocytes Relative: 6 %
Neutro Abs: 4.7 10*3/uL (ref 1.7–7.7)
Neutrophils Relative %: 82 %
Platelets: 338 10*3/uL (ref 150–400)
RBC: 2.98 MIL/uL — ABNORMAL LOW (ref 3.87–5.11)
RDW: 18.5 % — ABNORMAL HIGH (ref 11.5–15.5)
WBC: 5.7 10*3/uL (ref 4.0–10.5)
nRBC: 0 /100 WBC
nRBC: 0.4 % — ABNORMAL HIGH (ref 0.0–0.2)

## 2020-05-19 LAB — MAGNESIUM: Magnesium: 2.2 mg/dL (ref 1.7–2.4)

## 2020-05-19 LAB — RENAL FUNCTION PANEL
Albumin: 1.2 g/dL — ABNORMAL LOW (ref 3.5–5.0)
Anion gap: 11 (ref 5–15)
BUN: 100 mg/dL — ABNORMAL HIGH (ref 6–20)
CO2: 23 mmol/L (ref 22–32)
Calcium: 8.6 mg/dL — ABNORMAL LOW (ref 8.9–10.3)
Chloride: 102 mmol/L (ref 98–111)
Creatinine, Ser: 2.68 mg/dL — ABNORMAL HIGH (ref 0.44–1.00)
GFR calc Af Amer: 22 mL/min — ABNORMAL LOW (ref >60)
GFR calc non Af Amer: 19 mL/min — ABNORMAL LOW (ref >60)
Glucose, Bld: 168 mg/dL — ABNORMAL HIGH (ref 70–99)
Phosphorus: 3.9 mg/dL (ref 2.5–4.6)
Potassium: 4.6 mmol/L (ref 3.5–5.1)
Sodium: 136 mmol/L (ref 135–145)

## 2020-05-19 LAB — GLUCOSE, CAPILLARY
Glucose-Capillary: 129 mg/dL — ABNORMAL HIGH (ref 70–99)
Glucose-Capillary: 158 mg/dL — ABNORMAL HIGH (ref 70–99)
Glucose-Capillary: 154 mg/dL — ABNORMAL HIGH (ref 70–99)
Glucose-Capillary: 77 mg/dL (ref 70–99)
Glucose-Capillary: 163 mg/dL — ABNORMAL HIGH (ref 70–99)

## 2020-05-19 LAB — C-REACTIVE PROTEIN: CRP: 14.6 mg/dL — ABNORMAL HIGH (ref <1.0)

## 2020-05-19 LAB — APTT: aPTT: 55 seconds — ABNORMAL HIGH (ref 24–36)

## 2020-05-19 LAB — HEPARIN LEVEL (UNFRACTIONATED): Heparin Unfractionated: 1.8 IU/mL — ABNORMAL HIGH (ref 0.30–0.70)

## 2020-05-19 SURGERY — CANCELLED PROCEDURE

## 2020-05-19 MED ORDER — APIXABAN 5 MG PO TABS
5.0000 mg | ORAL_TABLET | Freq: Two times a day (BID) | ORAL | Status: DC
  Administered 2020-05-19 – 2020-06-01 (×26): 5 mg via ORAL
  Filled 2020-05-19 (×28): qty 1

## 2020-05-19 MED ORDER — TACROLIMUS 1 MG PO CAPS
1.5000 mg | ORAL_CAPSULE | Freq: Two times a day (BID) | ORAL | Status: DC
  Administered 2020-05-19 – 2020-06-01 (×26): 1.5 mg via ORAL
  Filled 2020-05-19 (×28): qty 1

## 2020-05-19 MED ORDER — SODIUM CHLORIDE 0.9 % IV SOLN: INTRAVENOUS | Status: DC

## 2020-05-19 NOTE — Interval H&P Note (Signed)
History and Physical Interval Note:  05/19/2020 11:38 AM  Wanda [name]  has presented today for surgery, with the diagnosis of BACTERIMIA.  The various methods of treatment have been discussed with the patient and family. After consideration of risks, benefits and other options for treatment, the patient has consented to  Procedure(s): TRANSESOPHAGEAL ECHOCARDIOGRAM (TEE) (N/A) as a surgical intervention.  The patient's history has been reviewed, patient examined.  Patient is hypotensive on arrival to endoscopy suite. She also has a Dobhoff feeding tube in place. I have reached out to the primary team to discuss risks related to the procedure.   Jazman Reuter Harrell Gave

## 2020-05-19 NOTE — Progress Notes (Addendum)
[place] for Infectious Disease  Date of Admission:  05/01/2020      Total days of antibiotics 4  Meropenem            ASSESSMENT: Wanda [name] is a 59 y.o. 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 She was treated with zosyn during this time frame until 8/10.  Hospital day 14 she had high fevers - blood cultures revealed klebsiella aerogenes bacteremia in 4/4 bottles.   TTE indicates concern over possible left sided native valve endocarditis - this with klebsiella organism would be very rare. Unfortunately with an attempt to further investigate with TEE she became hypotensive in preparation for procedure and was not able to do the procedure due to presence of enteric feeding tube in addition to hemodynamic instability. Not sure we are going to be able to rule this out at least now. May consider in the future if she can get off dobhoff feeds in the next 1-2 weeks to help investigate. Would avoid adding aminoglycoside to beta lactam at this time given her already very poor creatinine clearance. Not sure there is enough benefit to justify 6 weeks of fluoroquinolone either. Continue meropenem for now.   Planning to try to avoid G-tube placement for now and increasing PO intake and bolus TFs for now. Can possibly arrange outpatient nearing the    PLAN: 1. Continue meropenem  2. ?TEE after she is off enteral feeds vs continue for 6 weeks    Principal Problem:   Septic shock ([place]) Active Problems:   DM (diabetes mellitus), type 2 with renal complications ([place])   Essential hypertension   CHRONIC KIDNEY DISEASE STAGE IV ([place])   Renal transplant disorder   Depression   History of kidney transplant   Wound infection   Leukocytosis   Pressure injury of skin   Palliative care by specialist   Goals of care, counseling/discussion   DNR (do not resuscitate) discussion   Failure to thrive in adult   Pain of  foot   . sodium chloride   Intravenous Once  . sodium chloride   Intravenous Once  . amiodarone  200 mg Oral Daily  . Chlorhexidine Gluconate Cloth  6 each Topical Daily  . darbepoetin (ARANESP) injection - NON-DIALYSIS  150 mcg Subcutaneous Q Mon-1800  . feeding supplement (ENSURE ENLIVE)  237 mL Oral TID BM  . feeding supplement (NEPRO CARB STEADY)  1,000 mL Per Tube Q24H  . feeding supplement (PROSource TF)  90 mL Per Tube BID  . folic acid  1 mg Oral Daily  . insulin aspart  0-15 Units Subcutaneous TID WC  . insulin aspart  0-5 Units Subcutaneous QHS  . insulin aspart  4 Units Subcutaneous Q4H  . lidocaine  5 mL Intradermal Once  . mirtazapine  7.5 mg Oral QHS  . multivitamin with minerals  1 tablet Oral Daily  . nortriptyline  25 mg Oral QHS  . pantoprazole  40 mg Oral Daily  . predniSONE  5 mg Oral Q breakfast  . senna-docusate  1 tablet Oral Daily  . sodium bicarbonate  650 mg Oral BID  . sodium zirconium cyclosilicate  10 g Oral BID  . tacrolimus  1 mg Oral BID  . thiamine  100 mg Oral Daily  . vitamin C  250 mg Oral BID  . zinc sulfate  220 mg Oral Daily    SUBJECTIVE: Continues to feel  better than when she arrived here.  Feels like she is eating better.    Review of Systems: Review of Systems  Constitutional: Negative for chills and fever.  Respiratory: Negative for cough and sputum production.   Cardiovascular: Negative for chest pain and leg swelling.  Gastrointestinal: Positive for diarrhea. Negative for abdominal pain and vomiting.  Genitourinary: Negative for dysuria.  Musculoskeletal: Positive for joint pain (L heel pain ). Negative for back pain and myalgias.  Neurological: Positive for weakness. Negative for dizziness and headaches.    Allergies  Allergen Reactions  . Propoxyphene N-Acetaminophen Other (See Comments)    Severe headache  . Hydrocodone-Acetaminophen Other (See Comments)     Severe headache   . Astemizole Rash and Other (See  Comments)    headache   . Cephalosporins Rash    OBJECTIVE: Vitals:   05/19/20 1111 05/19/20 1122 05/19/20 1138 05/19/20 1226  BP: (!) 115/23 (!) 84/21 (!) 84/50 (!) 102/25  Pulse: (!) 104   (!) 101  Resp: (!) 24   (!) 21  Temp:    98.4 F (36.9 C)  TempSrc:    Oral  SpO2: 100%   98%  Weight:      Height:       Body mass index is 23.25 kg/m.  Physical Exam Vitals reviewed.  Constitutional:      Appearance: She is ill-appearing.     Comments: Sitting upright bed, no distress.   HENT:     Nose:     Comments: Enteric tube in place    Mouth/Throat:     Mouth: Mucous membranes are moist.     Pharynx: Oropharynx is clear.  Eyes:     General: No scleral icterus.    Pupils: Pupils are equal, round, and reactive to light.  Cardiovascular:     Rate and Rhythm: Normal rate and regular rhythm.     Heart sounds: No murmur heard.   Pulmonary:     Effort: Pulmonary effort is normal.     Breath sounds: Normal breath sounds. No rhonchi.  Abdominal:     General: Bowel sounds are normal. There is no distension.     Palpations: Abdomen is soft.     Tenderness: There is no abdominal tenderness.  Musculoskeletal:     Comments: RUE AVG +bruit/thrill, no tenderness or fluctuance/warmth  Skin:    General: Skin is warm and dry.     Capillary Refill: Capillary refill takes less than 2 seconds.     Findings: No rash.     Comments: R BKA stump wrapped and clean/dry L heel wrapped and clean  Neurological:     Mental Status: She is alert and oriented to person, place, and time.     Lab Results Lab Results  Component Value Date   WBC 5.7 05/19/2020   HGB 9.0 (L) 05/19/2020   HCT 28.3 (L) 05/19/2020   MCV 95.0 05/19/2020   PLT 338 05/19/2020    Lab Results  Component Value Date   CREATININE 2.68 (H) 05/19/2020   BUN 100 (H) 05/19/2020   NA 136 05/19/2020   K 4.6 05/19/2020   CL 102 05/19/2020   CO2 23 05/19/2020    Lab Results  Component Value Date   ALT 10 05/02/2020    AST 21 05/02/2020   ALKPHOS 301 (H) 05/02/2020   BILITOT 0.9 05/02/2020     Microbiology: Recent Results (from the past 240 hour(s))  Culture, blood (Routine X 2) w Reflex to ID Panel  Status: Abnormal   Collection Time: 05/15/20 10:21 PM   Specimen: BLOOD LEFT HAND  Result Value Ref Range Status   Specimen Description BLOOD LEFT HAND  Final   Special Requests   Final    BOTTLES DRAWN AEROBIC AND ANAEROBIC Blood Culture adequate volume   Culture  Setup Time   Final    GRAM NEGATIVE RODS IN BOTH AEROBIC AND ANAEROBIC BOTTLES CRITICAL RESULT CALLED TO, READ BACK BY AND VERIFIED WITH: C. KO PHARMD, AT 1333 05/16/20 BY D. VANHOOK Performed at [facility], [place] [address], [place], [place] [address]    Culture ENTEROBACTER AEROGENES (A)  Final   Report Status 05/18/2020 FINAL  Final   Organism ID, Bacteria ENTEROBACTER AEROGENES  Final      Susceptibility   Enterobacter aerogenes - MIC*    CEFAZOLIN >=64 RESISTANT Resistant     CEFEPIME 1 SENSITIVE Sensitive     CEFTAZIDIME >=64 RESISTANT Resistant     CEFTRIAXONE >=64 RESISTANT Resistant     CIPROFLOXACIN <=0.25 SENSITIVE Sensitive     GENTAMICIN <=1 SENSITIVE Sensitive     IMIPENEM 0.5 SENSITIVE Sensitive     TRIMETH/SULFA <=20 SENSITIVE Sensitive     PIP/TAZO >=128 RESISTANT Resistant     * ENTEROBACTER AEROGENES  Blood Culture ID Panel (Reflexed)     Status: Abnormal   Collection Time: 05/15/20 10:21 PM  Result Value Ref Range Status   Enterococcus faecalis NOT DETECTED NOT DETECTED Final   Enterococcus Faecium NOT DETECTED NOT DETECTED Final   Listeria monocytogenes NOT DETECTED NOT DETECTED Final   Staphylococcus species NOT DETECTED NOT DETECTED Final   Staphylococcus aureus (BCID) NOT DETECTED NOT DETECTED Final   Staphylococcus epidermidis NOT DETECTED NOT DETECTED Final   Staphylococcus lugdunensis NOT DETECTED NOT DETECTED Final   Streptococcus species NOT DETECTED NOT DETECTED Final   Streptococcus  agalactiae NOT DETECTED NOT DETECTED Final   Streptococcus pneumoniae NOT DETECTED NOT DETECTED Final   Streptococcus pyogenes NOT DETECTED NOT DETECTED Final   A.calcoaceticus-baumannii NOT DETECTED NOT DETECTED Final   Bacteroides fragilis NOT DETECTED NOT DETECTED Final   Enterobacterales DETECTED (A) NOT DETECTED Final    Comment: Enterobacterales represent a large order of gram negative bacteria, not a single organism. CRITICAL RESULT CALLED TO, READ BACK BY AND VERIFIED WITH: C. KO PHARMD, AT 1333 05/16/20 BY D. VANHOOK    Enterobacter cloacae complex NOT DETECTED NOT DETECTED Final   Escherichia coli NOT DETECTED NOT DETECTED Final   Klebsiella aerogenes DETECTED (A) NOT DETECTED Final    Comment: CRITICAL RESULT CALLED TO, READ BACK BY AND VERIFIED WITH: C. KO PHARMD, AT 1333 05/16/20 BY D. VANHOOK    Klebsiella oxytoca NOT DETECTED NOT DETECTED Final   Klebsiella pneumoniae NOT DETECTED NOT DETECTED Final   Proteus species NOT DETECTED NOT DETECTED Final   Salmonella species NOT DETECTED NOT DETECTED Final   Serratia marcescens NOT DETECTED NOT DETECTED Final   Haemophilus influenzae NOT DETECTED NOT DETECTED Final   Neisseria meningitidis NOT DETECTED NOT DETECTED Final   Pseudomonas aeruginosa NOT DETECTED NOT DETECTED Final   Stenotrophomonas maltophilia NOT DETECTED NOT DETECTED Final   Candida albicans NOT DETECTED NOT DETECTED Final   Candida auris NOT DETECTED NOT DETECTED Final   Candida glabrata NOT DETECTED NOT DETECTED Final   Candida krusei NOT DETECTED NOT DETECTED Final   Candida parapsilosis NOT DETECTED NOT DETECTED Final   Candida tropicalis NOT DETECTED NOT DETECTED Final   Cryptococcus neoformans/gattii NOT DETECTED NOT  DETECTED Final   CTX-M ESBL NOT DETECTED NOT DETECTED Final   Carbapenem resistance IMP NOT DETECTED NOT DETECTED Final   Carbapenem resistance KPC NOT DETECTED NOT DETECTED Final   Carbapenem resistance NDM NOT DETECTED NOT DETECTED Final    Carbapenem resist OXA 48 LIKE NOT DETECTED NOT DETECTED Final   Carbapenem resistance VIM NOT DETECTED NOT DETECTED Final    Comment: Performed at [facility], [place] [address] [place], [place] [address]  Culture, blood (Routine X 2) w Reflex to ID Panel     Status: Abnormal   Collection Time: 05/15/20 10:35 PM   Specimen: BLOOD LEFT ARM  Result Value Ref Range Status   Specimen Description BLOOD LEFT ARM  Final   Special Requests   Final    BOTTLES DRAWN AEROBIC AND ANAEROBIC Blood Culture adequate volume   Culture  Setup Time   Final    GRAM NEGATIVE RODS IN BOTH AEROBIC AND ANAEROBIC BOTTLES CRITICAL VALUE NOTED.  VALUE IS CONSISTENT WITH PREVIOUSLY REPORTED AND CALLED VALUE.    Culture (A)  Final    ENTEROBACTER AEROGENES SUSCEPTIBILITIES PERFORMED ON PREVIOUS CULTURE WITHIN THE LAST 5 DAYS. Performed at [facility], [place] [address] [place],  [address]    Report Status 05/18/2020 FINAL  Final     Janene Madeira, MSN, NP-C [place] [facility] Infectious Disease [email or web address] Pager: [phone] Office: [phone] [place]: [phone]

## 2020-05-19 NOTE — Progress Notes (Signed)
Pt came back to rm 23 from Endo. VSS except bp (MD been aware, this is her normal basline). Pt alert.  Lavenia Atlas, RN

## 2020-05-19 NOTE — Progress Notes (Signed)
[place] for Apixaban to IV Heparin transition Indication: atrial fibrillation  Allergies  Allergen Reactions  . Propoxyphene N-Acetaminophen Other (See Comments)    Severe headache  . Hydrocodone-Acetaminophen Other (See Comments)     Severe headache   . Astemizole Rash and Other (See Comments)    headache   . Cephalosporins Rash    Patient Measurements: Height: 5\' 9"  (175.3 cm) Weight: 71.4 kg (157 lb 6.5 oz) IBW/kg (Calculated) : 66.2 Heparin Dosing Weight: 70.3 kg  Vital Signs: Temp: 98.4 F (36.9 C) (08/17 1226) Temp Source: Oral (08/17 1226) BP: 102/25 (08/17 1226) Pulse Rate: 101 (08/17 1226)  Labs: Recent Labs    05/17/20 0441 05/17/20 0441 05/18/20 0136 05/18/20 1412 05/19/20 0939 05/19/20 0945  HGB 7.9*   < > 7.1*  --  9.0*  --   HCT 25.0*  --  23.0*  --  28.3*  --   PLT 275  --  255  --  338  --   APTT  --   --   --  51* 55*  --   HEPARINUNFRC  --   --   --   --  1.80*  --   CREATININE 2.55*  --  2.54*  --   --  2.68*   < > = values in this interval not displayed.    Estimated Creatinine Clearance: 23.6 mL/min (A) (by C-G formula based on SCr of 2.68 mg/dL (H)).   Medical History: Past Medical History:  Diagnosis Date  . Anemia   . Arthritis    "hands" (07/24/2013) pt denies  . Chronic kidney disease, stage IV (severe) ([place])    "never went on dialysis" (07/24/2013)  . Diabetes mellitus type II   . Diabetic peripheral neuropathy ([place])   . Dialysis patient [facility])    Monday Wednesday Friday   . Dyspnea on exertion    pt denies  . Foot pain   . GERD (gastroesophageal reflux disease)   . Gout   . History of chicken pox   . History of measles   . History of mumps   . Hyperlipidemia   . Hypothyroidism   . Migraines    "q 3 months" (07/24/2013)  . Nonspecific abnormal unspecified cardiovascular function study   . Obesity   . PAD (peripheral artery disease) ([place])   . Peripheral neuropathy   .  Peripheral vascular disease ([place])   . Pneumonia 09/2012  . Renal transplant disorder   . Shingles   . Sleep apnea    "getting ready to get tested again cause dr says I have it" (07/24/2013) pt states was tested and was told she did not have sleep apnea / no cpap  . Unspecified essential hypertension     Assessment: 59 years of age female on chronic Apixaban for history of atrial fibrillation now to be held for G-tube placement later this week. Pharmacy consulted to transition to IV Heparin therapy.   PEG unable to be placed by IR, pharmacy asked to restart apixaban.  Goal of Therapy:  Heparin level 0.3-0.7 units/ml aPTT 66-102 seconds Monitor platelets by anticoagulation protocol: Yes   Plan:  -Stop heparin -Resume apixaban 5mg  BID -Pharmacy will sign off, reconsult as needed   Arrie Senate, PharmD, BCPS Clinical Pharmacist [phone] Please check AMION for all [place] numbers 05/19/2020

## 2020-05-19 NOTE — Progress Notes (Signed)
PROGRESS NOTE    Wanda [name]  YYT:[id number] DOB: 10-24-1960 DOA: 05/01/2020 PCP: Lorene Dy, MD    Chief Complaint  Patient presents with  . Skin Ulcer  . Fatigue  . Fever    Brief Narrative:   59 year old lady with prior history of diabetes, hypertension, paroxysmal atrial fibrillation on Eliquis, hyperlipidemia, end-stage renal disease s/p failed kidney transplant in 2013 and repeat renal transplant in September 2020, stage IV CKD, anemia of chronic disease, history of gastric sleeve surgery, hypoalbuminemia, right foot ulcer, chronic neuropathy presents with right foot ulcer, fevers.  X-rays of the Right foot did not show any evidence of osteomyelitis she was started on IV antibiotics and fluids.orthopedics consulted and she underwent right BKA. Due to her poor oral intake, a cortrak was placed and she was started on tube feeds. As per RN, pt is eating between 25 to 50 % orally. She appears very deconditioned. Palliative care consulted for goals of care, and at this time, patient and family would like to aggressively treat the treatable, and the patient reported to [facility] with palliative that if medical treatment does not work, she would think about hospice.   Hospital course is complicated by fever and klebsiella Aerogenes bacteremia.  Source of bacteremia unclear. CXR does nt sow infection, UA shows large leukocytes, . MRI of the left foot does not show any osteomyelitis. A line was removed on 05/17/2020. She was started on cefepime. ID consulted, changed IV cefepime to IV meropenem.  TTE showed possible Mitral valve vegetations, . TEE was ordered , but pt's BP was low prior to the procedure and couldn't be done. At this time due to her tenuous blood pressure, unable to do TEE, recommend ? 6 weeks of IV antibiotics, will defer to ID for further recommendations on the duration of the antibiotics.   Meanwhile General surgery consulted for evaluation of G tube placement in view of  her poor oral intake and deconditioning and IR unable to place PEG tube due to her h/o gastric sleeve surgery. . Surgery recommends encouraging oral intake and decreasing the tube feeds in half or even stopping them. Discussed with nutritionist to take those in effect.  Pt seen and examined at bedside, no new complaints at this time.    Assessment & Plan:   Principal Problem:   Septic shock ([place]) Active Problems:   DM (diabetes mellitus), type 2 with renal complications (HCC)   Essential hypertension   CHRONIC KIDNEY DISEASE STAGE IV (SEV[facility])   Renal transplant disorder   Depression   History of kidney transplant   Wound infection   Leukocytosis   Pressure injury of skin   Palliative care by specialist   Goals of care, counseling/discussion   DNR (do not resuscitate) discussion   Failure to thrive in adult   Pain of foot   Sepsis present on admission Right foot ulcer infected with surrounding cellulitis. Pt completed the course of zosyn.  MRI of the right foot without contrast did not show any evidence of osteomyelitis but showed superficial phlegmon overlying the posterior calcaneus. 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  Patient appears very deconditioned. PT/OT EVAL recommending SNF on discharge. Pt is in agreement.   She started having fevers and  complaining of pain in the left foot on 05/15/2020. X rays of the foot ordered, which did not show any osteomyelitis.  Blood cultures from 05/15/2020 positive for klebsiella aerogenes. She was started on cefepime, which was changed to meropenam. Unclear  source of infection. UA appears abnormal. CXR does nto show any infection. Patient has arterial line placed on 05/05/2020. It was removed.  MRI of the left foot without contrast did not show any osteomyelitis. It shows Superficial ulceration over the posterior calcaneus with heel pad inflammation. No loculated fluid Collections. Insertional Achilles tendinosis with mild  reactive marrow. CRP is 13, and procalcitonin is 5.18 ID on board appreciate assistance.  She underwent TTE showing possible MV vegetations, TEE ordered for further evaluation, unfortunately due to her tenuous BP, it was not deemed safe for TEE.  She remains on meropenum for now.   Hypotension BP parameters fluctuating but pt is asymptomatic.    Stage IV CKD, metabolic acidosis the setting of end-stage renal disease. S/p renal transplant on chronic immunosuppressive medications/ Prograf. Creatinine is stable around 2.5.   Bicarb improved on sodium bicarb tabs.    Hyperkalemia with peaked t waves.  Improvement of potassium to 4.6 with lokelma.  Nephrology consulted and have signed off.    Severe protein calorie malnutrition/cachexia. Poor oral intake,  cor track was placed and tube feeds were started.  She continues to have minimal oral intake.  Albumin is 1.2. In view of poor oral intake, IR consulted to see if she is a candidate for PEG placement. Unfortunately pt has a h/o gastric sleeve and IR suggested that they do not have a proper window for percutaneous g tube placement and recommended surgery evaluation for G tube placement.  gen surgery consulted , suggested to encourage oral intake and decrease the tube feeds during the day or bolus feeds during the night.  Dietary on board .     Paroxysmal atrial fibrillation Rate not well controlled, currently on  amiodarone 200 mg daily.  On Eliquis for anticoagulation.  Echocardiogram showed Left ventricular ejection fraction, by estimation, is 70 to 75%. The left ventricle has hyperdynamic function. The left ventricle has no regional wall motion abnormalities. There is moderate concentric left  ventricular hypertrophy. Indeterminate  diastolic filling due to E-A fusion.  Her CHAD2 VASC2 score is 3 to 4.    Anemia of acute illness superimposed on Anemia of chronic disease/GI blood loss Postoperative anemia S/p 2 unit of PRBC  transfusion. Transfuse to keep hemoglobin greater than 7. Repeat hemoglobin is stable around 9.    Hypoalbuminemia/cachexia Nutritionist  consulted and on tube feeds via cortrak. Pt has poor oral intake. Added on Remeron for appetite stimulation.    Type 2 diabetes mellitus with nephropathy and neuropathy.  Continue with Lyrica. Continue with sliding scale insulin. CBG (last 3)  Recent Labs    05/19/20 0458 05/19/20 0828 05/19/20 1232  GLUCAP 129* 158* 154*   Continue with SSI.  No changes in meds.    Klebsiella bacteremia:  On IV cefepime and ID consulted for recommendations.  Echocardiogram ordered and it showed there appears to be a mobile mass/vegeation on the ventricular surface at the base of the anterior mitral leaflet that extends into the LVOT Suggest TEE to further evaluate if clinically  indicated Similar appearance to echo done on 05/05/20 . TEE ordered, but unfortunately it couldn't be done due to her tenuous BP.  Currently on meropenam, defer to ID for duration of antibiotics.                                        Pressure injury present on admission. Pressure Injury  05/01/20 Heel Left Unstageable - Full thickness tissue loss in which the base of the injury is covered by slough (yellow, tan, gray, green or brown) and/or eschar (tan, brown or black) in the wound bed. dark discoloration unstageable bugg (Active)  05/01/20 2330  Location: Heel  Location Orientation: Left  Staging: Unstageable - Full thickness tissue loss in which the base of the injury is covered by slough (yellow, tan, gray, green or brown) and/or eschar (tan, brown or black) in the wound bed.  Wound Description (Comments): dark discoloration unstageable buggy  Present on Admission: Yes   Wound care consulted and recommendations given.       DVT prophylaxis: Eliquis Code Status: full code.  Family Communication: none at bedside.  Disposition:   Status is: Inpatient  Remains inpatient  appropriate because:Ongoing diagnostic testing needed not appropriate for outpatient work up, IV treatments appropriate due to intensity of illness or inability to take PO and Inpatient level of care appropriate due to severity of illness   Dispo: The patient is from: Home              Anticipated d/c is to: SNF              Anticipated d/c date is: > 3 days              Patient currently is not medically stable to d/c.       Consultants:   Palliative.   Orthopedics.   Nephrology  IR   GEN surgery  ID    Procedures: s/p right BKA   Antimicrobials:  Anti-infectives (From admission, onward)   Start     Dose/Rate Route Frequency Ordered Stop   05/18/20 1030  meropenem (MERREM) 1 g in sodium chloride 0.9 % 100 mL IVPB     Discontinue     1 g 200 mL/hr over 30 Minutes Intravenous Every 12 hours 05/18/20 1001     05/16/20 1400  ceFEPIme (MAXIPIME) 2 g in sodium chloride 0.9 % 100 mL IVPB  Status:  Discontinued        2 g 200 mL/hr over 30 Minutes Intravenous Every 24 hours 05/16/20 1351 05/18/20 1001   05/07/20 1400  vancomycin (VANCOCIN) IVPB 1000 mg/200 mL premix        1,000 mg 200 mL/hr over 60 Minutes Intravenous On call to O.R. 05/07/20 0741 05/07/20 1550   05/05/20 1330  piperacillin-tazobactam (ZOSYN) IVPB 3.375 g  Status:  Discontinued        3.375 g 12.5 mL/hr over 240 Minutes Intravenous Every 8 hours 05/05/20 1251 05/12/20 1250   05/02/20 1600  vancomycin (VANCOREADY) IVPB 750 mg/150 mL  Status:  Discontinued        750 mg 150 mL/hr over 60 Minutes Intravenous Every 24 hours 05/01/20 1612 05/05/20 1251   05/02/20 1600  cefTRIAXone (ROCEPHIN) 2 g in sodium chloride 0.9 % 100 mL IVPB  Status:  Discontinued        2 g 200 mL/hr over 30 Minutes Intravenous Every 24 hours 05/01/20 1700 05/05/20 1250   05/01/20 1715  valGANciclovir (VALCYTE) 450 MG tablet TABS 450 mg  Status:  Discontinued        450 mg Oral Daily 05/01/20 1700 05/01/20 1847   05/01/20 1500   cefTRIAXone (ROCEPHIN) 2 g in sodium chloride 0.9 % 100 mL IVPB        2 g 200 mL/hr over 30 Minutes Intravenous  Once 05/01/20 1452 05/01/20 1638   05/01/20 1500  vancomycin (VANCOREADY) IVPB 1500 mg/300 mL        1,500 mg 150 mL/hr over 120 Minutes Intravenous  Once 05/01/20 1453 05/01/20 1755   05/01/20 1445  vancomycin (VANCOCIN) IVPB 1000 mg/200 mL premix  Status:  Discontinued        1,000 mg 200 mL/hr over 60 Minutes Intravenous  Once 05/01/20 1444 05/01/20 1453       Subjective: No new complaints at this time.   Objective: Vitals:   05/19/20 1111 05/19/20 1122 05/19/20 1138 05/19/20 1226  BP: (!) 115/23 (!) 84/21 (!) 84/50 (!) 102/25  Pulse: (!) 104   (!) 101  Resp: (!) 24   (!) 21  Temp:    98.4 F (36.9 C)  TempSrc:    Oral  SpO2: 100%   98%  Weight:      Height:        Intake/Output Summary (Last 24 hours) at 05/19/2020 1455 Last data filed at 05/19/2020 0715 Gross per 24 hour  Intake 433.33 ml  Output --  Net 433.33 ml   Filed Weights   05/17/20 0415 05/18/20 0404 05/19/20 0524  Weight: 66.7 kg 70.3 kg 71.4 kg    Examination:  General exam: Alert afebrile appears comfortable. Respiratory system: Air entry fair bilateral, no wheezing or rhonchi Cardiovascular system: S1-S2 heard, irregularly irregular, no JVD Gastrointestinal system: Abdomen is soft, nontender, nondistended, bowel sounds normal Central nervous system: Alert, oriented and answering all questions appropriately Extremities: Right BKA, left foot bandaged skin: No rashes seen Psychiatry: Mood is appropriate   Data Reviewed: I have personally reviewed following labs and imaging studies  CBC: Recent Labs  Lab 05/15/20 0250 05/16/20 0500 05/17/20 0441 05/18/20 0136 05/19/20 0939  WBC 7.5 7.7 7.8 6.0 5.7  NEUTROABS 5.7 6.2 5.8 4.4 4.7  HGB 8.1* 7.7* 7.9* 7.1* 9.0*  HCT 25.7* 24.7* 25.0* 23.0* 28.3*  MCV 95.9 97.2 98.0 97.0 95.0  PLT 210 223 275 255 193    Basic Metabolic  Panel: Recent Labs  Lab 05/15/20 0250 05/15/20 2237 05/16/20 0500 05/16/20 0726 05/17/20 0441 05/18/20 0136 05/19/20 0939 05/19/20 0945  NA 135  --  134*  --  135 136  --  136  K 5.6*   < > 4.9 5.2* 4.8 4.3  --  4.6  CL 103  --  101  --  102 102  --  102  CO2 22  --  22  --  22 21*  --  23  GLUCOSE 220*  --  124*  --  167* 170*  --  168*  BUN 74*  --  82*  --  99* 98*  --  100*  CREATININE 2.70*  --  2.54*  --  2.55* 2.54*  --  2.68*  CALCIUM 8.2*  --  8.4*  --  8.6* 8.4*  --  8.6*  MG 2.1  --  1.9  --  2.2 2.2 2.2  --   PHOS 2.7  --  3.6  --  4.2 5.1*  --  3.9   < > = values in this interval not displayed.    GFR: Estimated Creatinine Clearance: 23.6 mL/min (A) (by C-G formula based on SCr of 2.68 mg/dL (H)).  Liver Function Tests: Recent Labs  Lab 05/15/20 0250 05/16/20 0500 05/17/20 0441 05/18/20 0136 05/19/20 0945  ALBUMIN 1.2* 1.2* 1.2* 1.1* 1.2*    CBG: Recent Labs  Lab 05/18/20 2101 05/18/20 2358 05/19/20 0458 05/19/20 0828 05/19/20 1232  GLUCAP 109* 142*  129* 158* 154*     Recent Results (from the past 240 hour(s))  Culture, blood (Routine X 2) w Reflex to ID Panel     Status: Abnormal   Collection Time: 05/15/20 10:21 PM   Specimen: BLOOD LEFT HAND  Result Value Ref Range Status   Specimen Description BLOOD LEFT HAND  Final   Special Requests   Final    BOTTLES DRAWN AEROBIC AND ANAEROBIC Blood Culture adequate volume   Culture  Setup Time   Final    GRAM NEGATIVE RODS IN BOTH AEROBIC AND ANAEROBIC BOTTLES CRITICAL RESULT CALLED TO, READ BACK BY AND VERIFIED WITH: C. KO PHARMD, AT 1333 05/16/20 BY D. VANHOOK Performed at [facility], [place] [address] [place], [place] [address]    Culture ENTEROBACTER AEROGENES (A)  Final   Report Status 05/18/2020 FINAL  Final   Organism ID, Bacteria ENTEROBACTER AEROGENES  Final      Susceptibility   Enterobacter aerogenes - MIC*    CEFAZOLIN >=64 RESISTANT Resistant     CEFEPIME 1 SENSITIVE  Sensitive     CEFTAZIDIME >=64 RESISTANT Resistant     CEFTRIAXONE >=64 RESISTANT Resistant     CIPROFLOXACIN <=0.25 SENSITIVE Sensitive     GENTAMICIN <=1 SENSITIVE Sensitive     IMIPENEM 0.5 SENSITIVE Sensitive     TRIMETH/SULFA <=20 SENSITIVE Sensitive     PIP/TAZO >=128 RESISTANT Resistant     * ENTEROBACTER AEROGENES  Blood Culture ID Panel (Reflexed)     Status: Abnormal   Collection Time: 05/15/20 10:21 PM  Result Value Ref Range Status   Enterococcus faecalis NOT DETECTED NOT DETECTED Final   Enterococcus Faecium NOT DETECTED NOT DETECTED Final   Listeria monocytogenes NOT DETECTED NOT DETECTED Final   Staphylococcus species NOT DETECTED NOT DETECTED Final   Staphylococcus aureus (BCID) NOT DETECTED NOT DETECTED Final   Staphylococcus epidermidis NOT DETECTED NOT DETECTED Final   Staphylococcus lugdunensis NOT DETECTED NOT DETECTED Final   Streptococcus species NOT DETECTED NOT DETECTED Final   Streptococcus agalactiae NOT DETECTED NOT DETECTED Final   Streptococcus pneumoniae NOT DETECTED NOT DETECTED Final   Streptococcus pyogenes NOT DETECTED NOT DETECTED Final   A.calcoaceticus-baumannii NOT DETECTED NOT DETECTED Final   Bacteroides fragilis NOT DETECTED NOT DETECTED Final   Enterobacterales DETECTED (A) NOT DETECTED Final    Comment: Enterobacterales represent a large order of gram negative bacteria, not a single organism. CRITICAL RESULT CALLED TO, READ BACK BY AND VERIFIED WITH: C. KO PHARMD, AT 1333 05/16/20 BY D. VANHOOK    Enterobacter cloacae complex NOT DETECTED NOT DETECTED Final   Escherichia coli NOT DETECTED NOT DETECTED Final   Klebsiella aerogenes DETECTED (A) NOT DETECTED Final    Comment: CRITICAL RESULT CALLED TO, READ BACK BY AND VERIFIED WITH: C. KO PHARMD, AT 1333 05/16/20 BY D. VANHOOK    Klebsiella oxytoca NOT DETECTED NOT DETECTED Final   Klebsiella pneumoniae NOT DETECTED NOT DETECTED Final   Proteus species NOT DETECTED NOT DETECTED Final    Salmonella species NOT DETECTED NOT DETECTED Final   Serratia marcescens NOT DETECTED NOT DETECTED Final   Haemophilus influenzae NOT DETECTED NOT DETECTED Final   Neisseria meningitidis NOT DETECTED NOT DETECTED Final   Pseudomonas aeruginosa NOT DETECTED NOT DETECTED Final   Stenotrophomonas maltophilia NOT DETECTED NOT DETECTED Final   Candida albicans NOT DETECTED NOT DETECTED Final   Candida auris NOT DETECTED NOT DETECTED Final   Candida glabrata NOT DETECTED NOT DETECTED Final   Candida krusei  NOT DETECTED NOT DETECTED Final   Candida parapsilosis NOT DETECTED NOT DETECTED Final   Candida tropicalis NOT DETECTED NOT DETECTED Final   Cryptococcus neoformans/gattii NOT DETECTED NOT DETECTED Final   CTX-M ESBL NOT DETECTED NOT DETECTED Final   Carbapenem resistance IMP NOT DETECTED NOT DETECTED Final   Carbapenem resistance KPC NOT DETECTED NOT DETECTED Final   Carbapenem resistance NDM NOT DETECTED NOT DETECTED Final   Carbapenem resist OXA 48 LIKE NOT DETECTED NOT DETECTED Final   Carbapenem resistance VIM NOT DETECTED NOT DETECTED Final    Comment: Performed at [facility], [place] [address], [place], [place] [address]  Culture, blood (Routine X 2) w Reflex to ID Panel     Status: Abnormal   Collection Time: 05/15/20 10:35 PM   Specimen: BLOOD LEFT ARM  Result Value Ref Range Status   Specimen Description BLOOD LEFT ARM  Final   Special Requests   Final    BOTTLES DRAWN AEROBIC AND ANAEROBIC Blood Culture adequate volume   Culture  Setup Time   Final    GRAM NEGATIVE RODS IN BOTH AEROBIC AND ANAEROBIC BOTTLES CRITICAL VALUE NOTED.  VALUE IS CONSISTENT WITH PREVIOUSLY REPORTED AND CALLED VALUE.    Culture (A)  Final    ENTEROBACTER AEROGENES SUSCEPTIBILITIES PERFORMED ON PREVIOUS CULTURE WITHIN THE LAST 5 DAYS. Performed at [facility], [place] [address], [place], [place] [address]    Report Status 05/18/2020 FINAL  Final         Radiology Studies: CT HEAD  WO CONTRAST  Result Date: 05/17/2020 CLINICAL DATA:  Neuro deficit EXAM: CT HEAD WITHOUT CONTRAST TECHNIQUE: Contiguous axial images were obtained from the base of the skull through the vertex without intravenous contrast. COMPARISON:  10/16/2017 head CT and prior. FINDINGS: Brain: No acute infarct or intracranial hemorrhage. No mass lesion. No midline shift, ventriculomegaly or extra-axial fluid collection. Vascular: No hyperdense vessel. Bilateral skull base atherosclerotic calcifications. Skull: Negative for fracture or focal lesion. Sinuses/Orbits: Normal orbits. Minimal left maxillary sinus mucosal thickening. No mastoid effusion. Other: None. IMPRESSION: No acute intracranial process. Electronically Signed   By: Primitivo Gauze M.D.   On: 05/17/2020 18:05   MR FOOT LEFT WO CONTRAST  Result Date: 05/17/2020 CLINICAL DATA:  Chronic left foot pain EXAM: MRI OF THE LEFT FOOT WITHOUT CONTRAST TECHNIQUE: Multiplanar, multisequence MR imaging of the left was performed. No intravenous contrast was administered. COMPARISON:  None. FINDINGS: Bones/Joint/Cartilage No areas of cortical destruction or periosteal reaction are seen. There is minimally increased T2 hyperintense signal seen at the insertion site of the Achilles tendon with a small calcaneal enthesophyte. Normal osseous marrow signal seen throughout the remainder of the hindfoot. A small ankle joint effusion is present. The articular surfaces are maintained. Ligaments The Lisfranc ligaments are intact. Muscles and Tendons Mild fatty atrophy with increased signal seen throughout the muscles of the hindfoot. The visualized flexor and extensor tendons are intact. There is mildly increased signal seen within the 1 cm segment of Achilles tendon at the insertion site. Soft tissues Area of superficial ulceration overlying the posterior medial calcaneus with heel pad inflammation. There is diffuse subcutaneous edema seen within the plantar surface. No  loculated fluid collections or sinus tract. IMPRESSION: No evidence of osteomyelitis. Superficial ulceration over the posterior calcaneus with heel pad inflammation. No loculated fluid collections. Insertional Achilles tendinosis with mild reactive marrow. Electronically Signed   By: Prudencio Pair M.D.   On: 05/17/2020 19:40   ECHOCARDIOGRAM LIMITED  Result Date: 05/18/2020    ECHOCARDIOGRAM LIMITED REPORT   Patient Name:   EULALAH RUPERT Date of Exam: 05/18/2020 Medical Rec #:  [id number]         Height:       69.0 in Accession #:    [id number]        Weight:       155.0 lb Date of Birth:  [birth year]        BSA:          1.854 m Patient Age:    2 years          BP:           80/32 mmHg Patient Gender: F                 HR:           108 bpm. Exam Location:  Inpatient Procedure: Limited Echo, Limited Color Doppler and Cardiac Doppler Indications:    Bacteremia 790.7 / R78.81  History:        Patient has prior history of Echocardiogram examinations, most                 recent 05/05/2020. Risk Factors:Sleep Apnea, Dyslipidemia and                 Diabetes. Right lower extremity wound, amputation on 8/5.                 Chronic kidney disease- history of kidney transplant,                 Leukocytosis, Aerogenes bacteremia, Sepsis present on admission.  Sonographer:    Darlina Sicilian RDCS Referring Phys: [place]  1. Left ventricular ejection fraction, by estimation, is 60 to 65%. The left ventricle has normal function.  2. Left atrial size was moderately dilated.  3. Effusion appears smaller than seen on echo 05/05/20 . The pericardial effusion is lateral to the left ventricle and anterior to the right ventricle.  4. MV leaflets are thickened and calcified with acoustic artifact There is also moderate posterior annular calcification However there appears to be a mobile mass/vegeation on the ventricular surface at the base of the anterior mitral leaflet that extends into the LVOT Suggest  TEE to further evaluate if clinically indicated Similar appearance to echo done on 05/05/20 . The mitral valve is abnormal. Trivial mitral valve regurgitation.  5. The aortic valve is tricuspid. Aortic valve regurgitation is not visualized. Moderate Sclerosis. FINDINGS  Left Ventricle: Left ventricular ejection fraction, by estimation, is 60 to 65%. The left ventricle has normal function. Left Atrium: Left atrial size was moderately dilated. Pericardium: Effusion appears smaller than seen on echo 05/05/20. Trivial pericardial effusion is present. The pericardial effusion is lateral to the left ventricle and anterior to the right ventricle. Mitral Valve: MV leaflets are thickened and calcified with acoustic artifact There is also moderate posterior annular calcification However there appears to be a mobile mass/vegeation on the ventricular surface at the base of the anterior mitral leaflet that extends into the LVOT Suggest TEE to further evaluate if clinically indicated Similar appearance to echo done on 05/05/20. The mitral valve is abnormal. Trivial mitral valve regurgitation. Tricuspid Valve: The tricuspid valve is normal in structure. Tricuspid valve regurgitation is mild. Aortic Valve: The aortic valve is tricuspid. Aortic valve regurgitation is not visualized. Moderate Sclerosis. Pulmonic Valve: The pulmonic valve was normal in structure. Pulmonic valve regurgitation is trivial.  LEFT VENTRICLE PLAX 2D LVOT diam:     2.20 cm LV SV:         65 LV SV Index:   35 LVOT Area:     3.80 cm  AORTIC VALVE LVOT Vmax:   110.00 cm/s LVOT Vmean:  84.200 cm/s LVOT VTI:    0.171 m  SHUNTS Systemic VTI:  0.17 m Systemic Diam: 2.20 cm Jenkins Rouge MD Electronically signed by Jenkins Rouge MD Signature Date/Time: 05/18/2020/3:35:15 PM    Final         Scheduled Meds: . sodium chloride   Intravenous Once  . sodium chloride   Intravenous Once  . amiodarone  200 mg Oral Daily  . Chlorhexidine Gluconate Cloth  6 each Topical  Daily  . darbepoetin (ARANESP) injection - NON-DIALYSIS  150 mcg Subcutaneous Q Mon-1800  . feeding supplement (ENSURE ENLIVE)  237 mL Oral TID BM  . feeding supplement (NEPRO CARB STEADY)  1,000 mL Per Tube Q24H  . feeding supplement (PROSource TF)  90 mL Per Tube BID  . folic acid  1 mg Oral Daily  . insulin aspart  0-15 Units Subcutaneous TID WC  . insulin aspart  0-5 Units Subcutaneous QHS  . insulin aspart  4 Units Subcutaneous Q4H  . lidocaine  5 mL Intradermal Once  . mirtazapine  7.5 mg Oral QHS  . multivitamin with minerals  1 tablet Oral Daily  . nortriptyline  25 mg Oral QHS  . pantoprazole  40 mg Oral Daily  . predniSONE  5 mg Oral Q breakfast  . senna-docusate  1 tablet Oral Daily  . sodium bicarbonate  650 mg Oral BID  . sodium zirconium cyclosilicate  10 g Oral BID  . tacrolimus  1 mg Oral BID  . thiamine  100 mg Oral Daily  . vitamin C  250 mg Oral BID  . zinc sulfate  220 mg Oral Daily   Continuous Infusions: . sodium chloride 250 mL (05/16/20 1537)  . heparin 950 Units/hr (05/19/20 1120)  . meropenem (MERREM) IV 1 g (05/19/20 1011)     LOS: 18 days        Hosie Poisson, MD [facility]   To contact the attending provider between 7A-7P or the covering provider during after hours 7P-7A, please log into the web site [email or web address] and access using universal [place] password for that web site. If you do not have the password, please call the hospital operator.  05/19/2020, 2:55 PM

## 2020-05-19 NOTE — Progress Notes (Signed)
   05/18/20 1955  Vitals  Temp 97.6 F (36.4 C)  Temp Source Oral  BP (!) 106/36  MAP (mmHg) (!) 57  BP Location Left Leg  BP Method Automatic  Patient Position (if appropriate) Lying  Pulse Rate 95  Pulse Rate Source Monitor  ECG Heart Rate 97  Resp 15  MEWS COLOR  MEWS Score Color Green  Oxygen Therapy  SpO2 100 %  MEWS Score  MEWS Temp 0  MEWS Systolic 0  MEWS Pulse 0  MEWS RR 0  MEWS LOC 0  MEWS Score 0    Notified provider on call of patient's current BP  - No response received

## 2020-05-19 NOTE — Progress Notes (Addendum)
[place] for Apixaban to IV Heparin transition Indication: atrial fibrillation  Allergies  Allergen Reactions  . Propoxyphene N-Acetaminophen Other (See Comments)    Severe headache  . Hydrocodone-Acetaminophen Other (See Comments)     Severe headache   . Astemizole Rash and Other (See Comments)    headache   . Cephalosporins Rash    Patient Measurements: Height: 5\' 9"  (175.3 cm) Weight: 71.4 kg (157 lb 6.5 oz) IBW/kg (Calculated) : 66.2 Heparin Dosing Weight: 70.3 kg  Vital Signs: Temp: 98.3 F (36.8 C) (08/17 0719) Temp Source: Oral (08/17 0719) BP: 115/23 (08/17 1111) Pulse Rate: 104 (08/17 1111)  Labs: Recent Labs    05/17/20 0441 05/18/20 0136 05/18/20 1412 05/19/20 0939 05/19/20 0945  HGB 7.9* 7.1*  --   --   --   HCT 25.0* 23.0*  --   --   --   PLT 275 255  --   --   --   APTT  --   --  51* 55*  --   HEPARINUNFRC  --   --   --  1.80*  --   CREATININE 2.55* 2.54*  --   --  2.68*    Estimated Creatinine Clearance: 23.6 mL/min (A) (by C-G formula based on SCr of 2.68 mg/dL (H)).   Medical History: Past Medical History:  Diagnosis Date  . Anemia   . Arthritis    "hands" (07/24/2013) pt denies  . Chronic kidney disease, stage IV (severe) ([place])    "never went on dialysis" (07/24/2013)  . Diabetes mellitus type II   . Diabetic peripheral neuropathy ([place])   . Dialysis patient [facility])    Monday Wednesday Friday   . Dyspnea on exertion    pt denies  . Foot pain   . GERD (gastroesophageal reflux disease)   . Gout   . History of chicken pox   . History of measles   . History of mumps   . Hyperlipidemia   . Hypothyroidism   . Migraines    "q 3 months" (07/24/2013)  . Nonspecific abnormal unspecified cardiovascular function study   . Obesity   . PAD (peripheral artery disease) ([place])   . Peripheral neuropathy   . Peripheral vascular disease ([place])   . Pneumonia 09/2012  . Renal transplant disorder   .  Shingles   . Sleep apnea    "getting ready to get tested again cause dr says I have it" (07/24/2013) pt states was tested and was told she did not have sleep apnea / no cpap  . Unspecified essential hypertension     Assessment: 59 years of age female on chronic Apixaban for history of atrial fibrillation now to be held for G-tube placement later this week. Pharmacy consulted to transition to IV Heparin therapy.   Initial heparin level came back elevated as expected at 1.8 given recent DOAC dosing. APTT came back subtherapeutic at 55, on 800 units/hr. Hgb yesterday was 7.1, plt 255 - now Hgb 9, plt 338 after receiving 1 PRBC on 8/17. No s/sx of bleeding or infusion issues per nursing.   Goal of Therapy:  Heparin level 0.3-0.7 units/ml aPTT 66-102 seconds Monitor platelets by anticoagulation protocol: Yes   Plan:  Increase heparin infusion to 950 units/hr  Order aPTT in 8 hours Monitor daily HL/aPTT until correlate, CBC, and for s/sx of bleeding F/u G-tube placement and ability to resume apixaban therapy   Antonietta Jewel, PharmD, [place] Clinical Pharmacist  Phone: [phone]  05/19/2020 11:14 AM  Please check AMION for all [place] phone numbers After 10:00 PM, call [place] [phone]

## 2020-05-19 NOTE — Progress Notes (Signed)
Pt experienced low bp. Pt asymptomatic. MD notified and aware. No further intervention needed per MD as long as pt asymptomatic. Will continue to monitor the pt.  Lavenia Atlas, RN

## 2020-05-19 NOTE — Anesthesia Preprocedure Evaluation (Addendum)
Anesthesia Evaluation    Reviewed: Allergy & Precautions, Patient's Chart, lab work & pertinent test results  Airway        Dental   Pulmonary sleep apnea ,           Cardiovascular hypertension, + Peripheral Vascular Disease       Neuro/Psych  Headaches, PSYCHIATRIC DISORDERS Depression  Neuromuscular disease CVA    GI/Hepatic Neg liver ROS, GERD  Medicated,  Endo/Other  diabetesHypothyroidism   Renal/GU CRFRenal disease     Musculoskeletal  (+) Arthritis ,   Abdominal   Peds  Hematology negative hematology ROS (+)   Anesthesia Other Findings   Reproductive/Obstetrics                             Anesthesia Physical Anesthesia Plan  ASA: III  Anesthesia Plan: MAC   Post-op Pain Management:    Induction: Intravenous  PONV Risk Score and Plan: 0 and Propofol infusion  Airway Management Planned: Natural Airway, Nasal Cannula and Simple Face Mask  Additional Equipment: None  Intra-op Plan:   Post-operative Plan:   Informed Consent:   Plan Discussed with: CRNA  Anesthesia Plan Comments: (Pt currently hypotensive in Endo suite 70's-90's SBP despite multiple changes of location of the BP cuff. After discussion with Dr. Harrell Gave and Dr. Karleen Hampshire, we have decided not to proceed with TEE under anesthesia due to the elevated risk of anesthesia with minimal benefit. 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 Based on previous findings, non-surgical management has been selected with extensive antibiotic treatment. The TEE would "possibly" aid in duration of antibiotic but based on Dr. Karleen Hampshire the duration can be extended without TEE confirmation without major sequelae. Will not proceed with TEE at this time.     Echo:  1. Left ventricular ejection fraction, by estimation, is 60 to 65%. The left ventricle has  normal function. 2. Left atrial size was moderately dilated. 3. Effusion appears smaller than seen on echo 05/05/20 . The pericardial effusion is lateral to the left ventricle and anterior to the right ventricle. 4. MV leaflets are thickened and calcified with acoustic artifact There is also moderate posterior annular calcification However there appears to be a mobile mass/vegeation on the ventricular surface at the base of the anterior mitral leaflet that extends into the LVOT Suggest TEE to further evaluate if clinically indicated Similar appearance to echo done on 05/05/20 . The mitral valve is abnormal. Trivial mitral valve regurgitation. 5. The aortic valve is tricuspid. Aortic valve regurgitation is not visualized. Moderate Sclerosis.)      Anesthesia Quick Evaluation

## 2020-05-19 NOTE — Progress Notes (Signed)
Calorie Count Note  48 hour calorie count ordered.  Diet: Dysphagia 3 Diet, thin liquids  Supplements: Ensure Enlive BID  Day 1 (8/16):  Breakfast: refused  Lunch: 70 kcal, 4 g protein Dinner: refused Supplements: 100 kcal, 5 g protein  Total intake: 170 kcal (8% of minimum estimated needs)  9 protein (8% of minimum estimated needs)  Estimated Nutritional Needs:  Kcal:  2100-2400 kcals Protein:  110-130 grams Fluid:  >/= 2.0 L  Nutrition Dx: Severe Malnutrition related to chronic illness as evidenced by moderate fat depletion, severe muscle depletion, percent weight loss.  Ongoing  Goal: Patient will meet greater than or equal to 90% of their needs  Not meeting  Intervention:   -Continue calorie count  -Ensure Enlive TID -Add Magic Cup TID  Nocturnal feedings:  -Nepro@ 45ml/hr x 14 hrs via Cortrak (6pm to 8am) -39ml ProSource BID  Provides:1546kcals, 106 grams protein, 547ml free water. Meets 74% kcal needs and 96% of protein needs.    Mariana Single RD, LDN Clinical Nutrition Pager listed in [place]

## 2020-05-20 LAB — CBC WITH DIFFERENTIAL/PLATELET
Abs Immature Granulocytes: 0.15 10*3/uL — ABNORMAL HIGH (ref 0.00–0.07)
Basophils Absolute: 0 10*3/uL (ref 0.0–0.1)
Basophils Relative: 0 %
Eosinophils Absolute: 0.1 10*3/uL (ref 0.0–0.5)
Eosinophils Relative: 2 %
HCT: 30.2 % — ABNORMAL LOW (ref 36.0–46.0)
Hemoglobin: 9.5 g/dL — ABNORMAL LOW (ref 12.0–15.0)
Immature Granulocytes: 3 %
Lymphocytes Relative: 26 %
Lymphs Abs: 1.4 10*3/uL (ref 0.7–4.0)
MCH: 30 pg (ref 26.0–34.0)
MCHC: 31.5 g/dL (ref 30.0–36.0)
MCV: 95.3 fL (ref 80.0–100.0)
Monocytes Absolute: 0.2 10*3/uL (ref 0.1–1.0)
Monocytes Relative: 4 %
Neutro Abs: 3.6 10*3/uL (ref 1.7–7.7)
Neutrophils Relative %: 65 %
Platelets: 374 10*3/uL (ref 150–400)
RBC: 3.17 MIL/uL — ABNORMAL LOW (ref 3.87–5.11)
RDW: 18.9 % — ABNORMAL HIGH (ref 11.5–15.5)
WBC: 5.6 10*3/uL (ref 4.0–10.5)
nRBC: 0.4 % — ABNORMAL HIGH (ref 0.0–0.2)

## 2020-05-20 LAB — RENAL FUNCTION PANEL
Albumin: 1.2 g/dL — ABNORMAL LOW (ref 3.5–5.0)
Anion gap: 11 (ref 5–15)
BUN: 97 mg/dL — ABNORMAL HIGH (ref 6–20)
CO2: 22 mmol/L (ref 22–32)
Calcium: 8.6 mg/dL — ABNORMAL LOW (ref 8.9–10.3)
Chloride: 104 mmol/L (ref 98–111)
Creatinine, Ser: 2.75 mg/dL — ABNORMAL HIGH (ref 0.44–1.00)
GFR calc Af Amer: 21 mL/min — ABNORMAL LOW (ref >60)
GFR calc non Af Amer: 18 mL/min — ABNORMAL LOW (ref >60)
Glucose, Bld: 137 mg/dL — ABNORMAL HIGH (ref 70–99)
Phosphorus: 3.6 mg/dL (ref 2.5–4.6)
Potassium: 4 mmol/L (ref 3.5–5.1)
Sodium: 137 mmol/L (ref 135–145)

## 2020-05-20 LAB — MAGNESIUM: Magnesium: 2.3 mg/dL (ref 1.7–2.4)

## 2020-05-20 LAB — GLUCOSE, CAPILLARY
Glucose-Capillary: 103 mg/dL — ABNORMAL HIGH (ref 70–99)
Glucose-Capillary: 108 mg/dL — ABNORMAL HIGH (ref 70–99)
Glucose-Capillary: 122 mg/dL — ABNORMAL HIGH (ref 70–99)
Glucose-Capillary: 142 mg/dL — ABNORMAL HIGH (ref 70–99)
Glucose-Capillary: 142 mg/dL — ABNORMAL HIGH (ref 70–99)
Glucose-Capillary: 183 mg/dL — ABNORMAL HIGH (ref 70–99)

## 2020-05-20 LAB — HEPARIN LEVEL (UNFRACTIONATED): Heparin Unfractionated: 1.76 IU/mL — ABNORMAL HIGH (ref 0.30–0.70)

## 2020-05-20 LAB — C-REACTIVE PROTEIN: CRP: 13.5 mg/dL — ABNORMAL HIGH (ref <1.0)

## 2020-05-20 MED ORDER — DIPHENHYDRAMINE HCL 50 MG/ML IJ SOLN
25.0000 mg | Freq: Four times a day (QID) | INTRAMUSCULAR | Status: DC | PRN
  Administered 2020-05-20: 25 mg via INTRAVENOUS
  Filled 2020-05-20: qty 1

## 2020-05-20 NOTE — Progress Notes (Signed)
Occupational Therapy Treatment Patient Details Name: AIDAH FORQUER MRN: [id number] DOB: 07-21-1961 Today's Date: 05/20/2020    History of present illness THULA STEWART is a 59 y.o. female with medical history significant of diabetes type 2, hypertension, paroxysmal A. fib, hyperlipidemia, ESRD with failed kidney transplant in 2013 and repeat transplant in September 2020 at [facility] with current chronic renal disease stage IV, anemia of chronic disease, history of gastric sleeve surgery, hypoalbuminemia, right foot ulcer, chronic neuropathy presented with worsening right foot ulcer drainage getting worse over the last 2 weeks.  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  Pt has had pallative consult and continues to want aggressive treatment.   OT comments  Pt presents supine in bed agreeable to OT session. Pt continues to present with notable weakness and decreased mobility/ADL status. Pt requiring maxA for UB ADL during session. She participate in additional UB/LB AAROM for continued strengthening/endurance. Pt requiring totalA for repositioning to maintain midline while seated upright at bed level. Acute OT goals and goal date updated. Will continue per POC at this time.    Follow Up Recommendations  SNF;Supervision/Assistance - 24 hour    Equipment Recommendations  None recommended by OT          Precautions / Restrictions Precautions Precautions: Fall Precaution Comments: new R BKA       Mobility Bed Mobility Overal bed mobility: Needs Assistance             General bed mobility comments: totalA to reposition at midline/bedlevel; +2 totalA to boost towards [facility]                          ADL either performed or assessed with clinical judgement   ADL Overall ADL's : Needs assistance/impaired                 Upper Body Dressing : Maximal assistance;Bed level Upper Body Dressing Details (indicate cue type and reason): donning new gown                   General ADL  Comments: assisted with bil UE/LE HEP and donning new gown; repositioned at bed level as pt tending to lean towards the R                        Cognition Arousal/Alertness: Awake/alert Behavior During Therapy: Flat affect Overall Cognitive Status: Impaired/Different from baseline Area of Impairment: Problem solving;Awareness;Safety/judgement;Memory;Following commands                     Memory: Decreased short-term memory Following Commands: Follows one step commands inconsistently;Follows one step commands with increased time Safety/Judgement: Decreased awareness of safety;Decreased awareness of deficits Awareness: Intellectual Problem Solving: Slow processing;Requires verbal cues;Decreased initiation;Difficulty sequencing          Exercises Exercises: General Upper Extremity;General Lower Extremity General Exercises - Upper Extremity Shoulder Flexion: AAROM;Both;10 reps Shoulder Horizontal ABduction: AAROM;Both;10 reps Shoulder Horizontal ADduction: AAROM;Both;10 reps Elbow Flexion: AROM;Both;10 reps Elbow Extension: AROM;Both;10 reps Amputee Exercises Quad Sets: AAROM;Both;10 reps Straight Leg Raises: AROM;10 reps   Shoulder Instructions       General Comments      Pertinent Vitals/ Pain       Pain Assessment: No/denies pain  Home Living  Prior Functioning/Environment              Frequency  Min 2X/week        Progress Toward Goals  OT Goals(current goals can now be found in the care plan section)  Progress towards OT goals: Progressing toward goals  Acute Rehab OT Goals Patient Stated Goal: pt did not state OT Goal Formulation: With patient Time For Goal Achievement: 06/03/20 Potential to Achieve Goals: [place] Discharge plan remains appropriate    Co-evaluation                 AM-PAC OT "6 Clicks" Daily Activity     Outcome Measure   Help from another  person eating meals?: A Lot Help from another person taking care of personal grooming?: A Lot Help from another person toileting, which includes using toliet, bedpan, or urinal?: Total Help from another person bathing (including washing, rinsing, drying)?: A Lot Help from another person to put on and taking off regular upper body clothing?: A Lot Help from another person to put on and taking off regular lower body clothing?: Total 6 Click Score: 10    End of Session    OT Visit Diagnosis: Other abnormalities of gait and mobility (R26.89);Muscle weakness (generalized) (M62.81)   Activity Tolerance Patient tolerated treatment well   Patient Left in bed;with call bell/phone within reach   Nurse Communication Mobility status        Time: 1683-7290 OT Time Calculation (min): 25 min  Charges: OT General Charges $OT Visit: 1 Visit OT Treatments $Self Care/Home Management : 8-22 mins $Therapeutic Activity: 8-22 mins  Lou Cal, OT Acute Rehabilitation Services Pager [phone] Office [phone]    Raymondo Band 05/20/2020, 4:29 PM

## 2020-05-20 NOTE — Progress Notes (Signed)
Calorie Count Note  48 hour calorie count ordered.  Diet: Dysphagia 3 Diet, thin liquids  Supplements: Ensure Enlive BID + Magic Cup TID  Day 2 (8/17):  Breakfast: refused  Lunch: no documentation Dinner: refused Supplements: 350 kcal, 20 g protein  Total intake: 350 kcal (17% of minimum estimated needs)  20 protein (18% of minimum estimated needs)  Estimated Nutritional Needs:  Kcal:  2100-2400 kcals Protein:  110-130 grams Fluid:  >/= 2.0 L  Nutrition Dx: Severe Malnutrition related to chronic illness as evidenced by moderate fat depletion, severe muscle depletion, percent weight loss.  Ongoing  Goal: Patient will meet greater than or equal to 90% of their needs  Not meeting  Intervention:   Patient's PO intake has declined further over the last two days despite decrease in tube feeding and addition of supplements. If family wishes to pursue aggressive care logistics of surgical G-tube placement may need to be discussed further.   -Continue calorie count  -Ensure Enlive TID -Continue Magic Cup TID  Nocturnal feedings:  -Nepro@ 8ml/hr x 14 hrs via Cortrak (6pm to 8am) -83ml ProSource BID  Provides:1546kcals, 106 grams protein, 512ml free water. Meets 74% kcal needs and 96% of protein needs.    Mariana Single RD, LDN Clinical Nutrition Pager listed in [place]

## 2020-05-20 NOTE — Progress Notes (Signed)
PROGRESS NOTE    Wanda [name]  VFI:[id number] DOB: March 12, 1961 DOA: 05/01/2020 PCP: Lorene Dy, MD    Chief Complaint  Patient presents with  . Skin Ulcer  . Fatigue  . Fever    Brief Narrative:   59 year old lady with prior history of diabetes, hypertension, paroxysmal atrial fibrillation on Eliquis, hyperlipidemia, end-stage renal disease s/p failed kidney transplant in 2013 and repeat renal transplant in September 2020, stage IV CKD, anemia of chronic disease, history of gastric sleeve surgery, hypoalbuminemia, right foot ulcer, chronic neuropathy presents with right foot ulcer, fevers.  X-rays of the Right foot did not show any evidence of osteomyelitis she was started on IV antibiotics and fluids.orthopedics consulted and she underwent right BKA. Due to her poor oral intake, a cortrak was placed and she was started on tube feeds. As per RN, pt is eating between 25 to 50 % orally. She appears very deconditioned. Palliative care consulted for goals of care, and at this time, patient and family would like to aggressively treat the treatable, and the patient reported to [facility] with palliative that if medical treatment does not work, she would think about hospice.   Hospital course is complicated by fever and klebsiella Aerogenes bacteremia.  Source of bacteremia unclear. CXR does nt sow infection, UA shows large leukocytes, . MRI of the left foot does not show any osteomyelitis. A line was removed on 05/17/2020. She was started on cefepime. ID consulted, changed IV cefepime to IV meropenem.  TTE showed possible Mitral valve vegetations, . TEE was ordered , but pt's BP was low prior to the procedure and couldn't be done. At this time due to her tenuous blood pressure, unable to do TEE, recommend ? 6 weeks of IV antibiotics, will defer to ID for further recommendations on the duration of the antibiotics.   Meanwhile General surgery consulted for evaluation of G tube placement in view of  her poor oral intake and deconditioning and IR unable to place PEG tube due to her h/o gastric sleeve surgery. . Surgery recommends encouraging oral intake and decreasing the tube feeds in half or even stopping them. Discussed with nutritionist to take those in effect.  Patient seen and examined at bedside, no new complaints at this time.   Assessment & Plan:   Principal Problem:   Septic shock ([place]) Active Problems:   DM (diabetes mellitus), type 2 with renal complications (HCC)   Essential hypertension   CHRONIC KIDNEY DISEASE STAGE IV (SEV[facility])   Renal transplant disorder   Depression   History of kidney transplant   Wound infection   Leukocytosis   Pressure injury of skin   Palliative care by specialist   Goals of care, counseling/discussion   DNR (do not resuscitate) discussion   Failure to thrive in adult   Pain of foot   Sepsis present on admission Right foot ulcer infected with surrounding cellulitis. Pt completed the course of zosyn.  MRI of the right foot without contrast did not show any evidence of osteomyelitis but showed superficial phlegmon overlying the posterior calcaneus. 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  Patient appears very deconditioned. PT/OT EVAL recommending SNF on discharge. Pt is in agreement.   She started having fevers and  complaining of pain in the left foot on 05/15/2020. X rays of the foot ordered, which did not show any osteomyelitis.  Blood cultures from 05/15/2020 positive for klebsiella aerogenes. She was started on cefepime, which was changed to meropenam. Unclear source  of infection. UA appears abnormal. CXR does nto show any infection. Patient has arterial line placed on 05/05/2020. It was removed.  MRI of the left foot without contrast did not show any osteomyelitis. It shows Superficial ulceration over the posterior calcaneus with heel pad inflammation. No loculated fluid Collections. Insertional Achilles tendinosis with mild  reactive marrow. CRP is 13.5, and procalcitonin is 5.18 ID on board appreciate assistance.  She underwent TTE showing possible MV vegetations, TEE ordered for further evaluation, unfortunately due to her tenuous BP, it was not deemed safe for TEE.  She remains on meropenum for now.  No changes in medications today.  Patient remains afebrile and blood pressure parameters are borderline.  Hypotension BP parameters fluctuating but pt is asymptomatic.    Stage IV CKD, metabolic acidosis the setting of end-stage renal disease. S/p renal transplant on chronic immunosuppressive medications/ Prograf. Creatinine is stable around 2.5.  Repeat creatinine is at 2.75. Bicarb improved on sodium bicarb tabs.    Hyperkalemia with peaked t waves.  Improvement of potassium to 4.6 with lokelma.  Nephrology consulted and have signed off.    Severe protein calorie malnutrition/cachexia. Poor oral intake,  cor track was placed and tube feeds were started.  She continues to have minimal oral intake.  Albumin is 1.2. In view of poor oral intake, IR consulted to see if she is a candidate for PEG placement. Unfortunately pt has a h/o gastric sleeve and IR suggested that they do not have a proper window for percutaneous g tube placement and recommended surgery evaluation for G tube placement.  gen surgery consulted , suggested to encourage oral intake and decrease the tube feeds during the day or bolus feeds during the night.  Dietary on board .     Paroxysmal atrial fibrillation Rate better controlled today.  Currently on  amiodarone 200 mg daily.  On Eliquis for anticoagulation.  Echocardiogram showed Left ventricular ejection fraction, by estimation, is 70 to 75%. The left ventricle has hyperdynamic function. The left ventricle has no regional wall motion abnormalities. There is moderate concentric left  ventricular hypertrophy. Indeterminate  diastolic filling due to E-A fusion.  Her CHAD2 VASC2 score is  3 to 4.    Anemia of acute illness superimposed on Anemia of chronic disease/GI blood loss Postoperative anemia S/p 2 unit of PRBC transfusion. Transfuse to keep hemoglobin greater than 7. Repeat hemoglobin is stable at 9.5   Hypoalbuminemia/cachexia Nutritionist  consulted and on tube feeds via cortrak. Pt has poor oral intake. Added on Remeron for appetite stimulation.    Type 2 diabetes mellitus with nephropathy and neuropathy.  Continue with Lyrica. Continue with sliding scale insulin. CBG (last 3)  Recent Labs    05/20/20 0409 05/20/20 0809 05/20/20 1133  GLUCAP 142* 103* 183*   Continue with sliding scale insulin No changes in meds.    Klebsiella bacteremia:  On IV cefepime and ID consulted for recommendations.  Echocardiogram ordered and it showed there appears to be a mobile mass/vegeation on the ventricular surface at the base of the anterior mitral leaflet that extends into the LVOT Suggest TEE to further evaluate if clinically  indicated Similar appearance to echo done on 05/05/20 . TEE ordered, but unfortunately it couldn't be done due to her tenuous BP.  Currently on meropenam, defer to ID for duration of antibiotics.  Pressure injury present on admission. Pressure Injury 05/01/20 Heel Left Unstageable - Full thickness tissue loss in which the base of the injury is covered by slough (yellow, tan, gray, green or brown) and/or eschar (tan, brown or black) in the wound bed. dark discoloration unstageable bugg (Active)  05/01/20 2330  Location: Heel  Location Orientation: Left  Staging: Unstageable - Full thickness tissue loss in which the base of the injury is covered by slough (yellow, tan, gray, green or brown) and/or eschar (tan, brown or black) in the wound bed.  Wound Description (Comments): dark discoloration unstageable buggy  Present on Admission: Yes   Wound care consulted and recommendations given.       DVT  prophylaxis: Eliquis Code Status: full code.  Family Communication: none at bedside.  Disposition:   Status is: Inpatient  Remains inpatient appropriate because:Ongoing diagnostic testing needed not appropriate for outpatient work up, IV treatments appropriate due to intensity of illness or inability to take PO and Inpatient level of care appropriate due to severity of illness   Dispo: The patient is from: Home              Anticipated d/c is to: SNF              Anticipated d/c date is: > 3 days              Patient currently is not medically stable to d/c.       Consultants:   Palliative.   Orthopedics.   Nephrology  IR   GEN surgery  ID    Procedures: s/p right BKA   Antimicrobials:  Anti-infectives (From admission, onward)   Start     Dose/Rate Route Frequency Ordered Stop   05/18/20 1030  meropenem (MERREM) 1 g in sodium chloride 0.9 % 100 mL IVPB     Discontinue     1 g 200 mL/hr over 30 Minutes Intravenous Every 12 hours 05/18/20 1001     05/16/20 1400  ceFEPIme (MAXIPIME) 2 g in sodium chloride 0.9 % 100 mL IVPB  Status:  Discontinued        2 g 200 mL/hr over 30 Minutes Intravenous Every 24 hours 05/16/20 1351 05/18/20 1001   05/07/20 1400  vancomycin (VANCOCIN) IVPB 1000 mg/200 mL premix        1,000 mg 200 mL/hr over 60 Minutes Intravenous On call to O.R. 05/07/20 0741 05/07/20 1550   05/05/20 1330  piperacillin-tazobactam (ZOSYN) IVPB 3.375 g  Status:  Discontinued        3.375 g 12.5 mL/hr over 240 Minutes Intravenous Every 8 hours 05/05/20 1251 05/12/20 1250   05/02/20 1600  vancomycin (VANCOREADY) IVPB 750 mg/150 mL  Status:  Discontinued        750 mg 150 mL/hr over 60 Minutes Intravenous Every 24 hours 05/01/20 1612 05/05/20 1251   05/02/20 1600  cefTRIAXone (ROCEPHIN) 2 g in sodium chloride 0.9 % 100 mL IVPB  Status:  Discontinued        2 g 200 mL/hr over 30 Minutes Intravenous Every 24 hours 05/01/20 1700 05/05/20 1250   05/01/20 1715   valGANciclovir (VALCYTE) 450 MG tablet TABS 450 mg  Status:  Discontinued        450 mg Oral Daily 05/01/20 1700 05/01/20 1847   05/01/20 1500  cefTRIAXone (ROCEPHIN) 2 g in sodium chloride 0.9 % 100 mL IVPB        2 g 200 mL/hr over 30 Minutes Intravenous  Once 05/01/20  1452 05/01/20 1638   05/01/20 1500  vancomycin (VANCOREADY) IVPB 1500 mg/300 mL        1,500 mg 150 mL/hr over 120 Minutes Intravenous  Once 05/01/20 1453 05/01/20 1755   05/01/20 1445  vancomycin (VANCOCIN) IVPB 1000 mg/200 mL premix  Status:  Discontinued        1,000 mg 200 mL/hr over 60 Minutes Intravenous  Once 05/01/20 1444 05/01/20 1453       Subjective: No new complaints at this time  Objective: Vitals:   05/20/20 0411 05/20/20 0806 05/20/20 1125 05/20/20 1127  BP: (!) 108/40 (!) 108/36 (!) 123/49 (!) 123/49  Pulse: (!) 109 (!) 103 (!) 103 (!) 102  Resp: 19 18 20 20   Temp: 97.9 F (36.6 C) (!) 97.5 F (36.4 C) 97.6 F (36.4 C) 97.6 F (36.4 C)  TempSrc: Oral Oral Oral   SpO2: 100% 99% 99%   Weight:      Height:        Intake/Output Summary (Last 24 hours) at 05/20/2020 1358 Last data filed at 05/20/2020 0900 Gross per 24 hour  Intake 1173.77 ml  Output --  Net 1173.77 ml   Filed Weights   05/17/20 0415 05/18/20 0404 05/19/20 0524  Weight: 66.7 kg 70.3 kg 71.4 kg    Examination:  General exam: Alert, afebrile, does not appear to be in any distress. Respiratory system: Clear to auscultation bilaterally, no wheezing or rhonchi Cardiovascular system:S1-S2 heard, irregularly irregular, no JVD, Gastrointestinal system: Abdomen is soft, nontender, nondistended, bowel sounds normal Central nervous system: Alert and oriented, Extremities: Right BKA, left foot bandaged skin: No rashes seen Psychiatry: Mood is appropriate   Data Reviewed: I have personally reviewed following labs and imaging studies  CBC: Recent Labs  Lab 05/16/20 0500 05/17/20 0441 05/18/20 0136 05/19/20 0939  05/20/20 0607  WBC 7.7 7.8 6.0 5.7 5.6  NEUTROABS 6.2 5.8 4.4 4.7 3.6  HGB 7.7* 7.9* 7.1* 9.0* 9.5*  HCT 24.7* 25.0* 23.0* 28.3* 30.2*  MCV 97.2 98.0 97.0 95.0 95.3  PLT 223 275 255 338 242    Basic Metabolic Panel: Recent Labs  Lab 05/16/20 0500 05/16/20 0500 05/16/20 0726 05/17/20 0441 05/18/20 0136 05/19/20 0939 05/19/20 0945 05/20/20 0607  NA 134*  --   --  135 136  --  136 137  K 4.9   < > 5.2* 4.8 4.3  --  4.6 4.0  CL 101  --   --  102 102  --  102 104  CO2 22  --   --  22 21*  --  23 22  GLUCOSE 124*  --   --  167* 170*  --  168* 137*  BUN 82*  --   --  99* 98*  --  100* 97*  CREATININE 2.54*  --   --  2.55* 2.54*  --  2.68* 2.75*  CALCIUM 8.4*  --   --  8.6* 8.4*  --  8.6* 8.6*  MG 1.9  --   --  2.2 2.2 2.2  --  2.3  PHOS 3.6  --   --  4.2 5.1*  --  3.9 3.6   < > = values in this interval not displayed.    GFR: Estimated Creatinine Clearance: 23 mL/min (A) (by C-G formula based on SCr of 2.75 mg/dL (H)).  Liver Function Tests: Recent Labs  Lab 05/16/20 0500 05/17/20 0441 05/18/20 0136 05/19/20 0945 05/20/20 0607  ALBUMIN 1.2* 1.2* 1.1* 1.2* 1.2*    CBG:  Recent Labs  Lab 05/19/20 2011 05/20/20 0014 05/20/20 0409 05/20/20 0809 05/20/20 1133  GLUCAP 163* 108* 142* 103* 183*     Recent Results (from the past 240 hour(s))  Culture, blood (Routine X 2) w Reflex to ID Panel     Status: Abnormal   Collection Time: 05/15/20 10:21 PM   Specimen: BLOOD LEFT HAND  Result Value Ref Range Status   Specimen Description BLOOD LEFT HAND  Final   Special Requests   Final    BOTTLES DRAWN AEROBIC AND ANAEROBIC Blood Culture adequate volume   Culture  Setup Time   Final    GRAM NEGATIVE RODS IN BOTH AEROBIC AND ANAEROBIC BOTTLES CRITICAL RESULT CALLED TO, READ BACK BY AND VERIFIED WITH: C. KO PHARMD, AT 1333 05/16/20 BY D. VANHOOK Performed at [facility], [place] [address] [place], [place] [address]    Culture ENTEROBACTER AEROGENES (A)  Final    Report Status 05/18/2020 FINAL  Final   Organism ID, Bacteria ENTEROBACTER AEROGENES  Final      Susceptibility   Enterobacter aerogenes - MIC*    CEFAZOLIN >=64 RESISTANT Resistant     CEFEPIME 1 SENSITIVE Sensitive     CEFTAZIDIME >=64 RESISTANT Resistant     CEFTRIAXONE >=64 RESISTANT Resistant     CIPROFLOXACIN <=0.25 SENSITIVE Sensitive     GENTAMICIN <=1 SENSITIVE Sensitive     IMIPENEM 0.5 SENSITIVE Sensitive     TRIMETH/SULFA <=20 SENSITIVE Sensitive     PIP/TAZO >=128 RESISTANT Resistant     * ENTEROBACTER AEROGENES  Blood Culture ID Panel (Reflexed)     Status: Abnormal   Collection Time: 05/15/20 10:21 PM  Result Value Ref Range Status   Enterococcus faecalis NOT DETECTED NOT DETECTED Final   Enterococcus Faecium NOT DETECTED NOT DETECTED Final   Listeria monocytogenes NOT DETECTED NOT DETECTED Final   Staphylococcus species NOT DETECTED NOT DETECTED Final   Staphylococcus aureus (BCID) NOT DETECTED NOT DETECTED Final   Staphylococcus epidermidis NOT DETECTED NOT DETECTED Final   Staphylococcus lugdunensis NOT DETECTED NOT DETECTED Final   Streptococcus species NOT DETECTED NOT DETECTED Final   Streptococcus agalactiae NOT DETECTED NOT DETECTED Final   Streptococcus pneumoniae NOT DETECTED NOT DETECTED Final   Streptococcus pyogenes NOT DETECTED NOT DETECTED Final   A.calcoaceticus-baumannii NOT DETECTED NOT DETECTED Final   Bacteroides fragilis NOT DETECTED NOT DETECTED Final   Enterobacterales DETECTED (A) NOT DETECTED Final    Comment: Enterobacterales represent a large order of gram negative bacteria, not a single organism. CRITICAL RESULT CALLED TO, READ BACK BY AND VERIFIED WITH: C. KO PHARMD, AT 1333 05/16/20 BY D. VANHOOK    Enterobacter cloacae complex NOT DETECTED NOT DETECTED Final   Escherichia coli NOT DETECTED NOT DETECTED Final   Klebsiella aerogenes DETECTED (A) NOT DETECTED Final    Comment: CRITICAL RESULT CALLED TO, READ BACK BY AND VERIFIED  WITH: C. KO PHARMD, AT 1333 05/16/20 BY D. VANHOOK    Klebsiella oxytoca NOT DETECTED NOT DETECTED Final   Klebsiella pneumoniae NOT DETECTED NOT DETECTED Final   Proteus species NOT DETECTED NOT DETECTED Final   Salmonella species NOT DETECTED NOT DETECTED Final   Serratia marcescens NOT DETECTED NOT DETECTED Final   Haemophilus influenzae NOT DETECTED NOT DETECTED Final   Neisseria meningitidis NOT DETECTED NOT DETECTED Final   Pseudomonas aeruginosa NOT DETECTED NOT DETECTED Final   Stenotrophomonas maltophilia NOT DETECTED NOT DETECTED Final   Candida albicans NOT DETECTED NOT DETECTED Final   Candida auris  NOT DETECTED NOT DETECTED Final   Candida glabrata NOT DETECTED NOT DETECTED Final   Candida krusei NOT DETECTED NOT DETECTED Final   Candida parapsilosis NOT DETECTED NOT DETECTED Final   Candida tropicalis NOT DETECTED NOT DETECTED Final   Cryptococcus neoformans/gattii NOT DETECTED NOT DETECTED Final   CTX-M ESBL NOT DETECTED NOT DETECTED Final   Carbapenem resistance IMP NOT DETECTED NOT DETECTED Final   Carbapenem resistance KPC NOT DETECTED NOT DETECTED Final   Carbapenem resistance NDM NOT DETECTED NOT DETECTED Final   Carbapenem resist OXA 48 LIKE NOT DETECTED NOT DETECTED Final   Carbapenem resistance VIM NOT DETECTED NOT DETECTED Final    Comment: Performed at [facility], [place] [address], [place], [place] [address]  Culture, blood (Routine X 2) w Reflex to ID Panel     Status: Abnormal   Collection Time: 05/15/20 10:35 PM   Specimen: BLOOD LEFT ARM  Result Value Ref Range Status   Specimen Description BLOOD LEFT ARM  Final   Special Requests   Final    BOTTLES DRAWN AEROBIC AND ANAEROBIC Blood Culture adequate volume   Culture  Setup Time   Final    GRAM NEGATIVE RODS IN BOTH AEROBIC AND ANAEROBIC BOTTLES CRITICAL VALUE NOTED.  VALUE IS CONSISTENT WITH PREVIOUSLY REPORTED AND CALLED VALUE.    Culture (A)  Final    ENTEROBACTER AEROGENES SUSCEPTIBILITIES  PERFORMED ON PREVIOUS CULTURE WITHIN THE LAST 5 DAYS. Performed at [facility], [place] [address] [place], [place] [address]    Report Status 05/18/2020 FINAL  Final         Radiology Studies: ECHOCARDIOGRAM LIMITED  Result Date: 05/18/2020    ECHOCARDIOGRAM LIMITED REPORT   Patient Name:   Wanda [name] Date of Exam: 05/18/2020 Medical Rec #:  [id number]         Height:       69.0 in Accession #:    [id number]        Weight:       155.0 lb Date of Birth:  1961/03/20        BSA:          1.854 m Patient Age:    57 years          BP:           80/32 mmHg Patient Gender: F                 HR:           108 bpm. Exam Location:  Inpatient Procedure: Limited Echo, Limited Color Doppler and Cardiac Doppler Indications:    Bacteremia 790.7 / R78.81  History:        Patient has prior history of Echocardiogram examinations, most                 recent 05/05/2020. Risk Factors:Sleep Apnea, Dyslipidemia and                 Diabetes. Right lower extremity wound, amputation on 8/5.                 Chronic kidney disease- history of kidney transplant,                 Leukocytosis, Aerogenes bacteremia, Sepsis present on admission.  Sonographer:    Darlina Sicilian RDCS Referring Phys: [place]  1. Left ventricular ejection fraction, by estimation, is 60 to 65%. The left ventricle has normal function.  2. Left atrial size was moderately dilated.  3. Effusion appears smaller than seen on echo 05/05/20 . The pericardial effusion is lateral to the left ventricle and anterior to the right ventricle.  4. MV leaflets are thickened and calcified with acoustic artifact There is also moderate posterior annular calcification However there appears to be a mobile mass/vegeation on the ventricular surface at the base of the anterior mitral leaflet that extends into the LVOT Suggest TEE to further evaluate if clinically indicated Similar appearance to echo done on 05/05/20 . The mitral valve is abnormal.  Trivial mitral valve regurgitation.  5. The aortic valve is tricuspid. Aortic valve regurgitation is not visualized. Moderate Sclerosis. FINDINGS  Left Ventricle: Left ventricular ejection fraction, by estimation, is 60 to 65%. The left ventricle has normal function. Left Atrium: Left atrial size was moderately dilated. Pericardium: Effusion appears smaller than seen on echo 05/05/20. Trivial pericardial effusion is present. The pericardial effusion is lateral to the left ventricle and anterior to the right ventricle. Mitral Valve: MV leaflets are thickened and calcified with acoustic artifact There is also moderate posterior annular calcification However there appears to be a mobile mass/vegeation on the ventricular surface at the base of the anterior mitral leaflet that extends into the LVOT Suggest TEE to further evaluate if clinically indicated Similar appearance to echo done on 05/05/20. The mitral valve is abnormal. Trivial mitral valve regurgitation. Tricuspid Valve: The tricuspid valve is normal in structure. Tricuspid valve regurgitation is mild. Aortic Valve: The aortic valve is tricuspid. Aortic valve regurgitation is not visualized. Moderate Sclerosis. Pulmonic Valve: The pulmonic valve was normal in structure. Pulmonic valve regurgitation is trivial. LEFT VENTRICLE PLAX 2D LVOT diam:     2.20 cm LV SV:         65 LV SV Index:   35 LVOT Area:     3.80 cm  AORTIC VALVE LVOT Vmax:   110.00 cm/s LVOT Vmean:  84.200 cm/s LVOT VTI:    0.171 m  SHUNTS Systemic VTI:  0.17 m Systemic Diam: 2.20 cm Jenkins Rouge MD Electronically signed by Jenkins Rouge MD Signature Date/Time: 05/18/2020/3:35:15 PM    Final         Scheduled Meds: . sodium chloride   Intravenous Once  . sodium chloride   Intravenous Once  . amiodarone  200 mg Oral Daily  . apixaban  5 mg Oral BID  . Chlorhexidine Gluconate Cloth  6 each Topical Daily  . darbepoetin (ARANESP) injection - NON-DIALYSIS  150 mcg Subcutaneous Q Mon-1800  .  feeding supplement (ENSURE ENLIVE)  237 mL Oral TID BM  . feeding supplement (NEPRO CARB STEADY)  1,000 mL Per Tube Q24H  . feeding supplement (PROSource TF)  90 mL Per Tube BID  . folic acid  1 mg Oral Daily  . insulin aspart  0-15 Units Subcutaneous TID WC  . insulin aspart  0-5 Units Subcutaneous QHS  . insulin aspart  4 Units Subcutaneous Q4H  . lidocaine  5 mL Intradermal Once  . mirtazapine  7.5 mg Oral QHS  . multivitamin with minerals  1 tablet Oral Daily  . nortriptyline  25 mg Oral QHS  . pantoprazole  40 mg Oral Daily  . predniSONE  5 mg Oral Q breakfast  . senna-docusate  1 tablet Oral Daily  . sodium bicarbonate  650 mg Oral BID  . sodium zirconium cyclosilicate  10 g Oral BID  . tacrolimus  1.5 mg Oral BID  . thiamine  100  mg Oral Daily  . vitamin C  250 mg Oral BID  . zinc sulfate  220 mg Oral Daily   Continuous Infusions: . sodium chloride 250 mL (05/16/20 1537)  . meropenem (MERREM) IV 1 g (05/20/20 0909)     LOS: 19 days        Hosie Poisson, MD [facility]   To contact the attending provider between 7A-7P or the covering provider during after hours 7P-7A, please log into the web site [email or web address] and access using universal [place] password for that web site. If you do not have the password, please call the hospital operator.  05/20/2020, 1:58 PM

## 2020-05-20 NOTE — Progress Notes (Signed)
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 To discharging provider:  please sign these orders via discharge navigator,  Select New Orders & click on the button choice - Manage This Unsigned Work.     Thank you for allowing pharmacy to be a part of this patient's care.  Cala Bradford 05/20/2020, 11:14 AM

## 2020-05-20 NOTE — Progress Notes (Signed)
[facility] for Infectious Disease  Date of Admission:  05/01/2020      Total days of antibiotics 5  Meropenem            ASSESSMENT: Wanda [name] is a 59 y.o. 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 She was treated with zosyn during this time frame until 8/10.  Hospital day 14 she had high fevers - blood cultures revealed klebsiella aerogenes bacteremia in 4/4 bottles.   TTE indicates concern over possible left sided native valve endocarditis - this with klebsiella organism would be very rare. Unable to evaluate with TEE d/t risk factors associated with the procedure. Will plan for 6 weeks IV therapy given no other explanation for bacteremia and TTE findings.   She will need a tunneled PICC line given CrCl 23 - OK to arrange now that she has resolved fevers and acute sepsis.   Unclear what her discharge plan looks like for now - protein calorie malnutrition is driving factor now I believe.   OK for discharge from ID perspective     PLAN: 1. Continue meropenem BID  2. OPAT as outlined below   OPAT ORDERS:  Diagnosis: Bacteremia, ?Endocarditis   Culture Result: Klebsiella aerogenes   Allergies  Allergen Reactions  . Propoxyphene N-Acetaminophen Other (See Comments)    Severe headache  . Hydrocodone-Acetaminophen Other (See Comments)     Severe headache   . Astemizole Rash and Other (See Comments)    headache   . Cephalosporins Rash     Discharge antibiotics to be given via PICC line:  Per pharmacy protocol MEROPENEM     Duration: 6 weeks   End Date: 06/28/2020  [facility] Care Per Protocol with [employer] Use: Home health RN for IV administration and teaching, line care and labs.    Labs weekly while on IV antibiotics: _x_ CBC with differential __ BMP _x_ CMP __ CRP __ ESR __ Vancomycin trough __ CK  __ Please pull PIC at completion of IV antibiotics _x_ Please leave PIC in place  until doctor has seen patient or been notified ** needs IR appt for removal   Fax weekly labs to [phone]  Clinic Follow Up Appt: VIDEO visit arranged with Janene Madeira 06/22/2020 @ 10:45    Principal Problem:   Septic shock ([place]) Active Problems:   DM (diabetes mellitus), type 2 with renal complications ([place])   Essential hypertension   CHRONIC KIDNEY DISEASE STAGE IV ([place])   Renal transplant disorder   Depression   History of kidney transplant   Wound infection   Leukocytosis   Pressure injury of skin   Palliative care by specialist   Goals of care, counseling/discussion   DNR (do not resuscitate) discussion   Failure to thrive in adult   Pain of foot   . sodium chloride   Intravenous Once  . sodium chloride   Intravenous Once  . amiodarone  200 mg Oral Daily  . apixaban  5 mg Oral BID  . Chlorhexidine Gluconate Cloth  6 each Topical Daily  . darbepoetin (ARANESP) injection - NON-DIALYSIS  150 mcg Subcutaneous Q Mon-1800  . feeding supplement (ENSURE ENLIVE)  237 mL Oral TID BM  . feeding supplement (NEPRO CARB STEADY)  1,000 mL Per Tube Q24H  . feeding supplement (PROSource TF)  90 mL Per Tube BID  . folic acid  1 mg Oral Daily  . insulin aspart  0-15 Units Subcutaneous TID WC  . insulin aspart  0-5 Units Subcutaneous QHS  . insulin aspart  4 Units Subcutaneous Q4H  . lidocaine  5 mL Intradermal Once  . mirtazapine  7.5 mg Oral QHS  . multivitamin with minerals  1 tablet Oral Daily  . nortriptyline  25 mg Oral QHS  . pantoprazole  40 mg Oral Daily  . predniSONE  5 mg Oral Q breakfast  . senna-docusate  1 tablet Oral Daily  . sodium bicarbonate  650 mg Oral BID  . sodium zirconium cyclosilicate  10 g Oral BID  . tacrolimus  1.5 mg Oral BID  . thiamine  100 mg Oral Daily  . vitamin C  250 mg Oral BID  . zinc sulfate  220 mg Oral Daily    SUBJECTIVE: No concerns today. Has not eaten much of her breakfast.    Review of Systems: Review of Systems    Constitutional: Negative for chills and fever.  Respiratory: Negative for cough and sputum production.   Cardiovascular: Negative for chest pain and leg swelling.  Gastrointestinal: Positive for diarrhea. Negative for abdominal pain and vomiting.  Genitourinary: Negative for dysuria.  Musculoskeletal: Positive for joint pain (L heel pain ). Negative for back pain and myalgias.  Neurological: Positive for weakness. Negative for dizziness and headaches.    Allergies  Allergen Reactions  . Propoxyphene N-Acetaminophen Other (See Comments)    Severe headache  . Hydrocodone-Acetaminophen Other (See Comments)     Severe headache   . Astemizole Rash and Other (See Comments)    headache   . Cephalosporins Rash    OBJECTIVE: Vitals:   05/20/20 0411 05/20/20 0806 05/20/20 1125 05/20/20 1127  BP: (!) 108/40 (!) 108/36 (!) 123/49 (!) 123/49  Pulse: (!) 109 (!) 103 (!) 103 (!) 102  Resp: _0 Temp: 97.9 F (36.6 C) (!) 97.5 F (36.4 C) 97.6 F (36.4 C) 97.6 F (36.4 C)  TempSrc: Oral Oral Oral   SpO2: 100% 99% 99%   Weight:      Height:       Body mass index is 23.25 kg/m.  Physical Exam Vitals reviewed.  Constitutional:      Appearance: She is ill-appearing.     Comments: Sitting upright bed, no distress.   HENT:     Nose:     Comments: Enteric tube in place    Mouth/Throat:     Mouth: Mucous membranes are moist.     Pharynx: Oropharynx is clear.  Eyes:     General: No scleral icterus.    Pupils: Pupils are equal, round, and reactive to light.  Cardiovascular:     Rate and Rhythm: Normal rate and regular rhythm.     Heart sounds: No murmur heard.   Pulmonary:     Effort: Pulmonary effort is normal.     Breath sounds: Normal breath sounds. No rhonchi.  Abdominal:     General: Bowel sounds are normal. There is no distension.     Palpations: Abdomen is soft.     Tenderness: There is no abdominal tenderness.  Musculoskeletal:     Comments: RUE AVG  +bruit/thrill, no tenderness or fluctuance/warmth  Skin:    General: Skin is warm and dry.     Capillary Refill: Capillary refill takes less than 2 seconds.     Findings: No rash.     Comments: R BKA stump wrapped and clean/dry L heel wrapped and clean  Neurological:  Mental Status: She is alert and oriented to person, place, and time.     Lab Results Lab Results  Component Value Date   WBC 5.6 05/20/2020   HGB 9.5 (L) 05/20/2020   HCT 30.2 (L) 05/20/2020   MCV 95.3 05/20/2020   PLT 374 05/20/2020    Lab Results  Component Value Date   CREATININE 2.75 (H) 05/20/2020   BUN 97 (H) 05/20/2020   NA 137 05/20/2020   K 4.0 05/20/2020   CL 104 05/20/2020   CO2 22 05/20/2020    Lab Results  Component Value Date   ALT 10 05/02/2020   AST 21 05/02/2020   ALKPHOS 301 (H) 05/02/2020   BILITOT 0.9 05/02/2020     Microbiology: Recent Results (from the past 240 hour(s))  Culture, blood (Routine X 2) w Reflex to ID Panel     Status: Abnormal   Collection Time: 05/15/20 10:21 PM   Specimen: BLOOD LEFT HAND  Result Value Ref Range Status   Specimen Description BLOOD LEFT HAND  Final   Special Requests   Final    BOTTLES DRAWN AEROBIC AND ANAEROBIC Blood Culture adequate volume   Culture  Setup Time   Final    GRAM NEGATIVE RODS IN BOTH AEROBIC AND ANAEROBIC BOTTLES CRITICAL RESULT CALLED TO, READ BACK BY AND VERIFIED WITH: C. KO PHARMD, AT 1333 05/16/20 BY D. VANHOOK Performed at [facility], [place] [address] [place], [place] [address]    Culture ENTEROBACTER AEROGENES (A)  Final   Report Status 05/18/2020 FINAL  Final   Organism ID, Bacteria ENTEROBACTER AEROGENES  Final      Susceptibility   Enterobacter aerogenes - MIC*    CEFAZOLIN >=64 RESISTANT Resistant     CEFEPIME 1 SENSITIVE Sensitive     CEFTAZIDIME >=64 RESISTANT Resistant     CEFTRIAXONE >=64 RESISTANT Resistant     CIPROFLOXACIN <=0.25 SENSITIVE Sensitive     GENTAMICIN <=1 SENSITIVE Sensitive      IMIPENEM 0.5 SENSITIVE Sensitive     TRIMETH/SULFA <=20 SENSITIVE Sensitive     PIP/TAZO >=128 RESISTANT Resistant     * ENTEROBACTER AEROGENES  Blood Culture ID Panel (Reflexed)     Status: Abnormal   Collection Time: 05/15/20 10:21 PM  Result Value Ref Range Status   Enterococcus faecalis NOT DETECTED NOT DETECTED Final   Enterococcus Faecium NOT DETECTED NOT DETECTED Final   Listeria monocytogenes NOT DETECTED NOT DETECTED Final   Staphylococcus species NOT DETECTED NOT DETECTED Final   Staphylococcus aureus (BCID) NOT DETECTED NOT DETECTED Final   Staphylococcus epidermidis NOT DETECTED NOT DETECTED Final   Staphylococcus lugdunensis NOT DETECTED NOT DETECTED Final   Streptococcus species NOT DETECTED NOT DETECTED Final   Streptococcus agalactiae NOT DETECTED NOT DETECTED Final   Streptococcus pneumoniae NOT DETECTED NOT DETECTED Final   Streptococcus pyogenes NOT DETECTED NOT DETECTED Final   A.calcoaceticus-baumannii NOT DETECTED NOT DETECTED Final   Bacteroides fragilis NOT DETECTED NOT DETECTED Final   Enterobacterales DETECTED (A) NOT DETECTED Final    Comment: Enterobacterales represent a large order of gram negative bacteria, not a single organism. CRITICAL RESULT CALLED TO, READ BACK BY AND VERIFIED WITH: C. KO PHARMD, AT 1333 05/16/20 BY D. VANHOOK    Enterobacter cloacae complex NOT DETECTED NOT DETECTED Final   Escherichia coli NOT DETECTED NOT DETECTED Final   Klebsiella aerogenes DETECTED (A) NOT DETECTED Final    Comment: CRITICAL RESULT CALLED TO, READ BACK BY AND VERIFIED WITH: C. KO PHARMD,  AT 1333 05/16/20 BY D. VANHOOK    Klebsiella oxytoca NOT DETECTED NOT DETECTED Final   Klebsiella pneumoniae NOT DETECTED NOT DETECTED Final   Proteus species NOT DETECTED NOT DETECTED Final   Salmonella species NOT DETECTED NOT DETECTED Final   Serratia marcescens NOT DETECTED NOT DETECTED Final   Haemophilus influenzae NOT DETECTED NOT DETECTED Final   Neisseria  meningitidis NOT DETECTED NOT DETECTED Final   Pseudomonas aeruginosa NOT DETECTED NOT DETECTED Final   Stenotrophomonas maltophilia NOT DETECTED NOT DETECTED Final   Candida albicans NOT DETECTED NOT DETECTED Final   Candida auris NOT DETECTED NOT DETECTED Final   Candida glabrata NOT DETECTED NOT DETECTED Final   Candida krusei NOT DETECTED NOT DETECTED Final   Candida parapsilosis NOT DETECTED NOT DETECTED Final   Candida tropicalis NOT DETECTED NOT DETECTED Final   Cryptococcus neoformans/gattii NOT DETECTED NOT DETECTED Final   CTX-M ESBL NOT DETECTED NOT DETECTED Final   Carbapenem resistance IMP NOT DETECTED NOT DETECTED Final   Carbapenem resistance KPC NOT DETECTED NOT DETECTED Final   Carbapenem resistance NDM NOT DETECTED NOT DETECTED Final   Carbapenem resist OXA 48 LIKE NOT DETECTED NOT DETECTED Final   Carbapenem resistance VIM NOT DETECTED NOT DETECTED Final    Comment: Performed at [facility], [address] [place], [place] [address]  Culture, blood (Routine X 2) w Reflex to ID Panel     Status: Abnormal   Collection Time: 05/15/20 10:35 PM   Specimen: BLOOD LEFT ARM  Result Value Ref Range Status   Specimen Description BLOOD LEFT ARM  Final   Special Requests   Final    BOTTLES DRAWN AEROBIC AND ANAEROBIC Blood Culture adequate volume   Culture  Setup Time   Final    GRAM NEGATIVE RODS IN BOTH AEROBIC AND ANAEROBIC BOTTLES CRITICAL VALUE NOTED.  VALUE IS CONSISTENT WITH PREVIOUSLY REPORTED AND CALLED VALUE.    Culture (A)  Final    ENTEROBACTER AEROGENES SUSCEPTIBILITIES PERFORMED ON PREVIOUS CULTURE WITHIN THE LAST 5 DAYS. Performed at [facility], [place] [address] [place], [place] [address]    Report Status 05/18/2020 FINAL  Final     Janene Madeira, MSN, NP-C [place] Infectious Disease [email or web address] Pager: [phone] Office: [phone] [place]: [phone]

## 2020-05-21 DIAGNOSIS — R7881 Bacteremia: Secondary | ICD-10-CM

## 2020-05-21 DIAGNOSIS — B961 Klebsiella pneumoniae [K. pneumoniae] as the cause of diseases classified elsewhere: Secondary | ICD-10-CM

## 2020-05-21 LAB — RENAL FUNCTION PANEL
Albumin: 1.3 g/dL — ABNORMAL LOW (ref 3.5–5.0)
Anion gap: 11 (ref 5–15)
BUN: 99 mg/dL — ABNORMAL HIGH (ref 6–20)
CO2: 20 mmol/L — ABNORMAL LOW (ref 22–32)
Calcium: 8.6 mg/dL — ABNORMAL LOW (ref 8.9–10.3)
Chloride: 105 mmol/L (ref 98–111)
Creatinine, Ser: 2.7 mg/dL — ABNORMAL HIGH (ref 0.44–1.00)
GFR calc Af Amer: 21 mL/min — ABNORMAL LOW (ref >60)
GFR calc non Af Amer: 19 mL/min — ABNORMAL LOW (ref >60)
Glucose, Bld: 154 mg/dL — ABNORMAL HIGH (ref 70–99)
Phosphorus: 3.8 mg/dL (ref 2.5–4.6)
Potassium: 4.1 mmol/L (ref 3.5–5.1)
Sodium: 136 mmol/L (ref 135–145)

## 2020-05-21 LAB — GLUCOSE, CAPILLARY
Glucose-Capillary: 127 mg/dL — ABNORMAL HIGH (ref 70–99)
Glucose-Capillary: 128 mg/dL — ABNORMAL HIGH (ref 70–99)
Glucose-Capillary: 145 mg/dL — ABNORMAL HIGH (ref 70–99)
Glucose-Capillary: 149 mg/dL — ABNORMAL HIGH (ref 70–99)
Glucose-Capillary: 160 mg/dL — ABNORMAL HIGH (ref 70–99)
Glucose-Capillary: 166 mg/dL — ABNORMAL HIGH (ref 70–99)

## 2020-05-21 LAB — CBC WITH DIFFERENTIAL/PLATELET
Abs Immature Granulocytes: 0.16 10*3/uL — ABNORMAL HIGH (ref 0.00–0.07)
Basophils Absolute: 0 10*3/uL (ref 0.0–0.1)
Basophils Relative: 0 %
Eosinophils Absolute: 0.1 10*3/uL (ref 0.0–0.5)
Eosinophils Relative: 2 %
HCT: 30.7 % — ABNORMAL LOW (ref 36.0–46.0)
Hemoglobin: 9.4 g/dL — ABNORMAL LOW (ref 12.0–15.0)
Immature Granulocytes: 3 %
Lymphocytes Relative: 28 %
Lymphs Abs: 1.5 10*3/uL (ref 0.7–4.0)
MCH: 29.9 pg (ref 26.0–34.0)
MCHC: 30.6 g/dL (ref 30.0–36.0)
MCV: 97.8 fL (ref 80.0–100.0)
Monocytes Absolute: 0.3 10*3/uL (ref 0.1–1.0)
Monocytes Relative: 5 %
Neutro Abs: 3.2 10*3/uL (ref 1.7–7.7)
Neutrophils Relative %: 62 %
Platelets: 397 10*3/uL (ref 150–400)
RBC: 3.14 MIL/uL — ABNORMAL LOW (ref 3.87–5.11)
RDW: 18.7 % — ABNORMAL HIGH (ref 11.5–15.5)
WBC: 5.1 10*3/uL (ref 4.0–10.5)
nRBC: 0 % (ref 0.0–0.2)

## 2020-05-21 LAB — MAGNESIUM: Magnesium: 2.4 mg/dL (ref 1.7–2.4)

## 2020-05-21 LAB — C-REACTIVE PROTEIN: CRP: 12.2 mg/dL — ABNORMAL HIGH (ref <1.0)

## 2020-05-21 NOTE — Progress Notes (Signed)
Physical Therapy Treatment Patient Details Name: Wanda [name] MRN: [id number] DOB: 1961/04/19 Today's Date: 05/21/2020    History of Present Illness Wanda [name] is a 59 y.o. female with medical history significant of diabetes type 2, hypertension, paroxysmal A. fib, hyperlipidemia, ESRD with failed kidney transplant in 2013 and repeat transplant in September 2020 at [facility] with current chronic renal disease stage IV, anemia of chronic disease, history of gastric sleeve surgery, hypoalbuminemia, right foot ulcer, chronic neuropathy presented with worsening right foot ulcer drainage getting worse over the last 2 weeks.  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  Pt has had pallative consult and continues to want aggressive treatment.    PT Comments    Pt with limited to no progress.  Goals continued at current level.  She was more communicative but still with limited participation with transfers and exercise.  Remains very weak.  Pt had c/o lightheadedness and "seeing black spots" when sitting.  BP was 98/55 in sitting and 110/67 in supine.  Cont POC as able.     Follow Up Recommendations  SNF     Equipment Recommendations       Recommendations for Other Services       Precautions / Restrictions Precautions Precautions: Fall Precaution Comments: new R BKA Other Brace: limb protector Restrictions RLE Weight Bearing: Non weight bearing LLE Weight Bearing: Weight bearing as tolerated    Mobility  Bed Mobility Overal bed mobility: Needs Assistance Bed Mobility: Supine to Sit;Rolling;Sit to Supine Rolling: +2 for physical assistance;Max assist   Supine to sit: +2 for physical assistance;Max assist Sit to supine: +2 for physical assistance;Max assist   General bed mobility comments: Multiple rolls for cleaning; +2 max for transfers with positioning and bed pad to facilitate  Transfers                 General transfer comment: unable due to c/o lightheadedness  Ambulation/Gait                  Stairs             Wheelchair Mobility    Modified Rankin (Stroke Patients Only)       Balance Overall balance assessment: Needs assistance Sitting-balance support: Feet supported;Bilateral upper extremity supported Sitting balance-Leahy Scale: Zero Sitting balance - Comments: Sat at EOB for 1 minute with total A.  Worked on leaning forward and trying to get balance but pt too weak and w/ c/o lightheadedness.  BP has been soft at baseline per RN.                                    Cognition Arousal/Alertness: Awake/alert Behavior During Therapy: Flat affect Overall Cognitive Status: Impaired/Different from baseline Area of Impairment: Problem solving;Awareness;Safety/judgement;Memory;Following commands                     Memory: Decreased short-term memory Following Commands: Follows one step commands inconsistently;Follows one step commands with increased time Safety/Judgement: Decreased awareness of safety;Decreased awareness of deficits Awareness: Intellectual Problem Solving: Slow processing;Requires verbal cues;Decreased initiation;Difficulty sequencing;Requires tactile cues General Comments: Pt with flat affect and not making eye contact; was more communicative today      Exercises General Exercises - Lower Extremity Quad Sets: AROM;10 reps;Both;Supine Heel Slides: AAROM;10 reps;Both;Supine Hip ABduction/ADduction: AAROM;10 reps;Supine;Both Straight Leg Raises: AAROM;10 reps;Right    General Comments        Pertinent Vitals/Pain Pain  Assessment: No/denies pain    Home Living                      Prior Function            PT Goals (current goals can now be found in the care plan section) Acute Rehab PT Goals Patient Stated Goal: pt did not state PT Goal Formulation: With patient Time For Goal Achievement: 06/04/20 Potential to Achieve Goals: Poor Progress towards PT goals: Not progressing toward goals  - comment (limited due to c/o lightheadedness)    Frequency    Min 2X/week      PT Plan Current plan remains appropriate    Co-evaluation              AM-PAC PT "6 Clicks" Mobility   Outcome Measure  Help needed turning from your back to your side while in a flat bed without using bedrails?: Total Help needed moving from lying on your back to sitting on the side of a flat bed without using bedrails?: Total Help needed moving to and from a bed to a chair (including a wheelchair)?: Total Help needed standing up from a chair using your arms (e.g., wheelchair or bedside chair)?: Total Help needed to walk in hospital room?: Total Help needed climbing 3-5 steps with a railing? : Total 6 Click Score: 6    End of Session Equipment Utilized During Treatment: Oxygen Activity Tolerance: Other (comment) (limited due to c/o lightheadedness and fatigue) Patient left: in bed;with call bell/phone within reach;with bed alarm set Nurse Communication: Mobility status;Need for lift equipment PT Visit Diagnosis: Muscle weakness (generalized) (M62.81);Other abnormalities of gait and mobility (R26.89)     Time: 0175-1025 PT Time Calculation (min) (ACUTE ONLY): 23 min  Charges:  $Therapeutic Exercise: 8-22 mins $Therapeutic Activity: 8-22 mins                     Wanda [name], PT Acute Rehab Services Pager [phone] [facility] [phone]     Wanda [name] 05/21/2020, 4:18 PM

## 2020-05-21 NOTE — Progress Notes (Signed)
[facility] PROGRESS NOTE    Progress Note  Wanda [name]  TML:[id number] DOB: 02/28/1961 DOA: 05/01/2020 PCP: Lorene Dy, MD     Brief Narrative:   Wanda [name] is an 59 y.o. 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  X-ray did not show any evidence of osteomyelitis, she was started on empiric antibiotics and IV fluids, orthopedic surgery was consulted and she underwent a BKA.  Due to significant poor oral intake a core track was placed and she was started on tube feedings.  As per RN the patient has been eating 25 to 50% of her regular meals.  Palliative care was consulted and they met with family and they would like to continue to aggressively treat the treatable and if no improvement then they would think about hospice.  Hospital course was complicated by fever and Klebsiella bacteremia which is the source has remained unclear.  Chest x-ray does not show any infection.  MRI of the foot does not show osteomyelitis.  A-line was removed on 05/17/2020.  She was started empirically on IV cefepime.  ID was consulted who recommended to change her antibiotics to IV meropenem.  A TTE showed possible mitral valve vegetation, TEE was ordered but patient became hypotensive prior to procedure in the TEE was unable to be completed. ID recommended at least 6 weeks of empiric antibiotics.  Meanwhile general surgery was consulted for G-tube placement due to her significant poor oral intake and deconditioning, IR was unable to place PEG tube due to her gastric sleeve.  General surgery recommended to decrease her tube feedings and encourage oral intake.  Assessment/Plan:   Septic shock  [facility]): Right foot ulcer with surrounding cellulitis she completed a course of IV Zosyn. MRI did not show any evidence of osteomyelitis but it did show a superficial phlegmon on the posterior calcaneus. 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 Skilled therapy evaluated the patient recommended skilled nursing facility.   She started having fever and left foot pain on 05/15/2020, and x-ray of the foot did not show any evidence of osteomyelitis, blood cultures were positive for Klebsiella on 05/15/2020 she was started on IV cefepime which was later changed to IV meropenem, ID was consulted and the source of infection remains unclear. An MRI of the foot without contrast did not show osteomyelitis but it did show superficial ulceration over the posterior calcaneus with heel pad inflammation no loculated fluid or collection.  Her CRP was 13 procalcitonin was 5 ID was consulted who recommended TTE that showed possible mitral vegetation, TEE was scheduled but she became hypotensive and was deemed unsafe to do so. ID recommended to continue empiric antibiotics for 6 weeks.  Hypotension: Currently asymptomatic continue to monitor closely.  Chronic kidney disease: Creatinine stable at 2.5 continue to monitor intermittently continue bicarbonate tablets.  Hyperkalemia with peaked T waves: Nephrology was consulted he was treated with [facility] her potassium has improved.  Severe protein caloric malnutrition/cachexia: Core track was placed and feeding tube was started.  We were unable to place a PEG tube due to her gastric sleeve. Surgery was consulted recommended to decrease tube feedings and encourage oral intake. She has been eating about 100% of her food over the last 24 hours after her  tube feedings were stopped if she continues to eat most of her food we could probably discontinue her NG tube on 05/22/2020  Paroxysmal atrial fibrillation: Currently on amiodarone 200 and Eliquis.   Her chads Vascor is greater than 3.  Cachexia: Nutrition has been consulted continue tube feedings per core track.  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  Klebsiella bacteremia: As stated above, echo showed vegetation TEE could not be performed as she became hypotensive ID recommended to continue meropenem for at least 6 weeks.   RN Pressure Injury Documentation: Pressure Injury 05/01/20 Heel Left Unstageable - Full thickness tissue loss in which the base of the injury is covered by slough (yellow, tan, gray, green or brown) and/or eschar (tan, brown or black) in the wound bed. dark discoloration unstageable bugg (Active)  05/01/20 2330  Location: Heel  Location Orientation: Left  Staging: Unstageable - Full thickness tissue loss in which the base of the injury is covered by slough (yellow, tan, gray, green or brown) and/or eschar (tan, brown or black) in the wound bed.  Wound Description (Comments): dark discoloration unstageable buggy  Present on Admission: Yes    Estimated body mass index is 23.25 kg/m as calculated from the following:   Height as of this encounter: '5\' 9"'  (1.753 m).   Weight as of this encounter: 71.4 kg. Malnutrition Type:  Nutrition Problem: Severe Malnutrition Etiology: chronic illness   Malnutrition Characteristics:  Signs/Symptoms: moderate fat depletion, severe muscle depletion, percent weight loss   Nutrition Interventions:  Interventions: Prostat, Ensure Enlive (each supplement provides 350kcal and 20 grams of protein)    DVT prophylaxis: eliquis Family Communication:none Status is: Inpatient  Remains inpatient appropriate because:Unsafe d/c plan   Dispo: The patient is from: Home              Anticipated d/c is to: SNF              Anticipated d/c date is: 3 days              Patient currently is not medically stable to d/c.  She needs to  continue to eat most of her food once she is doing this we could probably discontinue the core track and try to get her to a skilled nursing facility I believe that if she continues to eat 100% of her food her core track can be discontinued on 05/22/2020 as she has eaten 100% of her food over the last 24 hours.    Code Status:     Code Status Orders  (From admission, onward)         Start     Ordered   05/07/20 1811  Full code  Continuous        05/07/20 1810        Code Status History    Date Active Date Inactive Code Status Order ID Comments User Context   05/01/2020 1701 05/07/2020 1810 Full Code [id number]  Aline August, MD ED   02/04/2020 2312 02/11/2020 1741 Full Code [id number]  Truddie Hidden, MD Inpatient   10/07/2016 2102 10/12/2016 0037 Full Code [id number]  Toy Baker, MD Inpatient   05/26/2015 1630 05/28/2015 1712 Full Code [id number]  Excell Seltzer, MD Inpatient   07/24/2013 1832 07/26/2013 1625 Full Code [id number]  Barton Dubois, MD Inpatient   01/08/2013 2341 01/11/2013 2238 Full Code [id number]  Rise Patience, MD Inpatient   09/04/2012 1841 09/08/2012 0238 Full  Code [id number]  Rosero, Lawana Pai, RN Inpatient   Advance Care Planning Activity        IV Access:    Peripheral IV   Procedures and diagnostic studies:   No results found.   Medical Consultants:    None.  Anti-Infectives:   IV meropenem  Subjective:    Wanda [name] no new complaints tolerating her diet she is hungry.  Objective:    Vitals:   05/20/20 1605 05/20/20 2046 05/21/20 0021 05/21/20 0407  BP: (!) 91/42 (!) 95/37 (!) 107/21 (!) 113/30  Pulse: (!) 101 99 99 (!) 105  Resp: 20 20 (!) 22 20  Temp: 97.6 F (36.4 C) 98.1 F (36.7 C) 98.2 F (36.8 C) 98.1 F (36.7 C)  TempSrc:  Oral Axillary Oral  SpO2:  99% 99% 99%  Weight:      Height:       SpO2: 99 % O2 Flow Rate (L/min): 2 L/min   Intake/Output Summary (Last 24 hours) at 05/21/2020 0956 Last data filed at  05/21/2020 0209 Gross per 24 hour  Intake 875 ml  Output --  Net 875 ml   Filed Weights   05/17/20 0415 05/18/20 0404 05/19/20 0524  Weight: 66.7 kg 70.3 kg 71.4 kg    Exam: General exam: In no acute distress. Respiratory system: Good air movement and clear to auscultation. Cardiovascular system: S1 & S2 heard, RRR.  Gastrointestinal system: Abdomen is nondistended, soft and nontender.  Extremities: No pedal edema. Skin: No rashes, lesions or ulcers Psychiatry: Judgement and insight appear normal. Mood & affect appropriate.    Data Reviewed:    Labs: Basic Metabolic Panel: Recent Labs  Lab 05/17/20 0441 05/17/20 0441 05/18/20 0136 05/18/20 0136 05/19/20 2876 05/19/20 0945 05/19/20 0945 05/20/20 0607 05/21/20 0245  NA 135  --  136  --   --  136  --  137 136  K 4.8   < > 4.3   < >  --  4.6   < > 4.0 4.1  CL 102  --  102  --   --  102  --  104 105  CO2 22  --  21*  --   --  23  --  22 20*  GLUCOSE 167*  --  170*  --   --  168*  --  137* 154*  BUN 99*  --  98*  --   --  100*  --  97* 99*  CREATININE 2.55*  --  2.54*  --   --  2.68*  --  2.75* 2.70*  CALCIUM 8.6*  --  8.4*  --   --  8.6*  --  8.6* 8.6*  MG 2.2  --  2.2  --  2.2  --   --  2.3 2.4  PHOS 4.2  --  5.1*  --   --  3.9  --  3.6 3.8   < > = values in this interval not displayed.   GFR Estimated Creatinine Clearance: 23.4 mL/min (A) (by C-G formula based on SCr of 2.7 mg/dL (H)). Liver Function Tests: Recent Labs  Lab 05/17/20 0441 05/18/20 0136 05/19/20 0945 05/20/20 0607 05/21/20 0245  ALBUMIN 1.2* 1.1* 1.2* 1.2* 1.3*   No results for input(s): LIPASE, AMYLASE in the last 168 hours. No results for input(s): AMMONIA in the last 168 hours. Coagulation profile No results for input(s): INR, PROTIME in the last 168 hours. [place]    05/19/20 8115 05/20/20  6203 05/21/20 0245  CRP 14.6* 13.5* 12.2*    Lab Results  Component Value Date   SARSCOV2NAA NEGATIVE 05/01/2020    [place] NEGATIVE 02/04/2020   [place] NEGATIVE 12/15/2019   [place] NEGATIVE 03/15/2019    CBC: Recent Labs  Lab 05/17/20 0441 05/18/20 0136 05/19/20 0939 05/20/20 0607 05/21/20 0245  WBC 7.8 6.0 5.7 5.6 5.1  NEUTROABS 5.8 4.4 4.7 3.6 3.2  HGB 7.9* 7.1* 9.0* 9.5* 9.4*  HCT 25.0* 23.0* 28.3* 30.2* 30.7*  MCV 98.0 97.0 95.0 95.3 97.8  PLT 275 255 338 374 397   Cardiac Enzymes: No results for input(s): CKTOTAL, CKMB, CKMBINDEX, TROPONINI in the last 168 hours. BNP (last 3 results) No results for input(s): PROBNP in the last 8760 hours. CBG: Recent Labs  Lab 05/20/20 1607 05/20/20 2043 05/21/20 0031 05/21/20 0424 05/21/20 0800  GLUCAP 142* 122* 127* 166* 128*   D-Dimer: No results for input(s): DDIMER in the last 72 hours. Hgb A1c: No results for input(s): HGBA1C in the last 72 hours. Lipid Profile: No results for input(s): CHOL, HDL, LDLCALC, TRIG, CHOLHDL, LDLDIRECT in the last 72 hours. Thyroid function studies: No results for input(s): TSH, T4TOTAL, T3FREE, THYROIDAB in the last 72 hours.  Invalid input(s): FREET3 Anemia work up: No results for input(s): VITAMINB12, FOLATE, FERRITIN, TIBC, IRON, RETICCTPCT in the last 72 hours. Sepsis Labs: Recent Labs  Lab 05/16/20 0500 05/16/20 1458 05/17/20 0441 05/17/20 0441 05/18/20 0136 05/19/20 0939 05/20/20 0607 05/21/20 0245  PROCALCITON  --  3.05 4.19  --  5.18  --   --   --   WBC   < >  --  7.8   < > 6.0 5.7 5.6 5.1   < > = values in this interval not displayed.   Microbiology Recent Results (from the past 240 hour(s))  Culture, blood (Routine X 2) w Reflex to ID Panel     Status: Abnormal   Collection Time: 05/15/20 10:21 PM   Specimen: BLOOD LEFT HAND  Result Value Ref Range Status   Specimen Description BLOOD LEFT HAND  Final   Special Requests   Final    BOTTLES DRAWN AEROBIC AND ANAEROBIC Blood Culture adequate volume   Culture  Setup Time   Final    GRAM NEGATIVE RODS IN BOTH AEROBIC  AND ANAEROBIC BOTTLES CRITICAL RESULT CALLED TO, READ BACK BY AND VERIFIED WITH: C. KO PHARMD, AT 5597 05/16/20 BY D. VANHOOK Performed at [facility], [place] [address], [place], [place] [address]    Culture ENTEROBACTER AEROGENES (A)  Final   Report Status 05/18/2020 FINAL  Final   Organism ID, Bacteria ENTEROBACTER AEROGENES  Final      Susceptibility   Enterobacter aerogenes - MIC*    CEFAZOLIN >=64 RESISTANT Resistant     CEFEPIME 1 SENSITIVE Sensitive     CEFTAZIDIME >=64 RESISTANT Resistant     CEFTRIAXONE >=64 RESISTANT Resistant     CIPROFLOXACIN <=0.25 SENSITIVE Sensitive     GENTAMICIN <=1 SENSITIVE Sensitive     IMIPENEM 0.5 SENSITIVE Sensitive     TRIMETH/SULFA <=20 SENSITIVE Sensitive     PIP/TAZO >=128 RESISTANT Resistant     * ENTEROBACTER AEROGENES  Blood Culture ID Panel (Reflexed)     Status: Abnormal   Collection Time: 05/15/20 10:21 PM  Result Value Ref Range Status   Enterococcus faecalis NOT DETECTED NOT DETECTED Final   Enterococcus Faecium NOT DETECTED NOT DETECTED Final   Listeria monocytogenes NOT DETECTED NOT DETECTED Final  Staphylococcus species NOT DETECTED NOT DETECTED Final   Staphylococcus aureus (BCID) NOT DETECTED NOT DETECTED Final   Staphylococcus epidermidis NOT DETECTED NOT DETECTED Final   Staphylococcus lugdunensis NOT DETECTED NOT DETECTED Final   Streptococcus species NOT DETECTED NOT DETECTED Final   Streptococcus agalactiae NOT DETECTED NOT DETECTED Final   Streptococcus pneumoniae NOT DETECTED NOT DETECTED Final   Streptococcus pyogenes NOT DETECTED NOT DETECTED Final   A.calcoaceticus-baumannii NOT DETECTED NOT DETECTED Final   Bacteroides fragilis NOT DETECTED NOT DETECTED Final   Enterobacterales DETECTED (A) NOT DETECTED Final    Comment: Enterobacterales represent a large order of gram negative bacteria, not a single organism. CRITICAL RESULT CALLED TO, READ BACK BY AND VERIFIED WITH: C. KO PHARMD, AT 1333 05/16/20 BY D.  VANHOOK    Enterobacter cloacae complex NOT DETECTED NOT DETECTED Final   Escherichia coli NOT DETECTED NOT DETECTED Final   Klebsiella aerogenes DETECTED (A) NOT DETECTED Final    Comment: CRITICAL RESULT CALLED TO, READ BACK BY AND VERIFIED WITH: C. KO PHARMD, AT 1333 05/16/20 BY D. VANHOOK    Klebsiella oxytoca NOT DETECTED NOT DETECTED Final   Klebsiella pneumoniae NOT DETECTED NOT DETECTED Final   Proteus species NOT DETECTED NOT DETECTED Final   Salmonella species NOT DETECTED NOT DETECTED Final   Serratia marcescens NOT DETECTED NOT DETECTED Final   Haemophilus influenzae NOT DETECTED NOT DETECTED Final   Neisseria meningitidis NOT DETECTED NOT DETECTED Final   Pseudomonas aeruginosa NOT DETECTED NOT DETECTED Final   Stenotrophomonas maltophilia NOT DETECTED NOT DETECTED Final   Candida albicans NOT DETECTED NOT DETECTED Final   Candida auris NOT DETECTED NOT DETECTED Final   Candida glabrata NOT DETECTED NOT DETECTED Final   Candida krusei NOT DETECTED NOT DETECTED Final   Candida parapsilosis NOT DETECTED NOT DETECTED Final   Candida tropicalis NOT DETECTED NOT DETECTED Final   Cryptococcus neoformans/gattii NOT DETECTED NOT DETECTED Final   CTX-M ESBL NOT DETECTED NOT DETECTED Final   Carbapenem resistance IMP NOT DETECTED NOT DETECTED Final   Carbapenem resistance KPC NOT DETECTED NOT DETECTED Final   Carbapenem resistance NDM NOT DETECTED NOT DETECTED Final   Carbapenem resist OXA 48 LIKE NOT DETECTED NOT DETECTED Final   Carbapenem resistance VIM NOT DETECTED NOT DETECTED Final    Comment: Performed at [facility], [address], [place], [place] [address]  Culture, blood (Routine X 2) w Reflex to ID Panel     Status: Abnormal   Collection Time: 05/15/20 10:35 PM   Specimen: BLOOD LEFT ARM  Result Value Ref Range Status   Specimen Description BLOOD LEFT ARM  Final   Special Requests   Final    BOTTLES DRAWN AEROBIC AND ANAEROBIC Blood Culture adequate volume    Culture  Setup Time   Final    GRAM NEGATIVE RODS IN BOTH AEROBIC AND ANAEROBIC BOTTLES CRITICAL VALUE NOTED.  VALUE IS CONSISTENT WITH PREVIOUSLY REPORTED AND CALLED VALUE.    Culture (A)  Final    ENTEROBACTER AEROGENES SUSCEPTIBILITIES PERFORMED ON PREVIOUS CULTURE WITHIN THE LAST 5 DAYS. Performed at [facility], [place] [address], [place], [place] [address]    Report Status 05/18/2020 FINAL  Final     Medications:   . sodium chloride   Intravenous Once  . sodium chloride   Intravenous Once  . amiodarone  200 mg Oral Daily  . apixaban  5 mg Oral BID  . Chlorhexidine Gluconate Cloth  6 each Topical Daily  .  darbepoetin (ARANESP) injection - NON-DIALYSIS  150 mcg Subcutaneous Q Mon-1800  . feeding supplement (ENSURE ENLIVE)  237 mL Oral TID BM  . feeding supplement (NEPRO CARB STEADY)  1,000 mL Per Tube Q24H  . feeding supplement (PROSource TF)  90 mL Per Tube BID  . folic acid  1 mg Oral Daily  . insulin aspart  0-15 Units Subcutaneous TID WC  . insulin aspart  0-5 Units Subcutaneous QHS  . insulin aspart  4 Units Subcutaneous Q4H  . lidocaine  5 mL Intradermal Once  . mirtazapine  7.5 mg Oral QHS  . multivitamin with minerals  1 tablet Oral Daily  . nortriptyline  25 mg Oral QHS  . pantoprazole  40 mg Oral Daily  . predniSONE  5 mg Oral Q breakfast  . senna-docusate  1 tablet Oral Daily  . sodium bicarbonate  650 mg Oral BID  . sodium zirconium cyclosilicate  10 g Oral BID  . tacrolimus  1.5 mg Oral BID  . thiamine  100 mg Oral Daily  . vitamin C  250 mg Oral BID  . zinc sulfate  220 mg Oral Daily   Continuous Infusions: . sodium chloride 250 mL (05/16/20 1537)  . meropenem (MERREM) IV 1 g (05/21/20 0947)      LOS: 20 days   Wanda [name]  [facility]  05/21/2020, 9:56 AM

## 2020-05-21 NOTE — Progress Notes (Signed)
[facility] Progress Note  2 Days Post-Op  Subjective: CC-  Calorie count noted. Patient is tolerating diet. States that she ate 1/2 grilled cheese last night and tator tots for dinner. She drank Ensure x2.   Objective: Vital signs in last 24 hours: Temp:  [97.6 F (36.4 C)-98.2 F (36.8 C)] 98.1 F (36.7 C) (08/19 0407) Pulse Rate:  [99-105] 105 (08/19 0407) Resp:  [19-22] 20 (08/19 0407) BP: (91-123)/(21-49) 113/30 (08/19 0407) SpO2:  [98 %-99 %] 99 % (08/19 0407) Last BM Date: 05/20/20  Intake/Output from previous day: 08/18 0701 - 08/19 0700 In: 1105 [P.O.:290; NG/GT:615; IV Piggyback:200] Out: -  Intake/Output this shift: No intake/output data recorded.  PE: Gen:  Alert, NAD, pleasant HEENT: cortrak in nare Pulm:  rate and effort normal Abd: Soft, NT/ND, +BS, no HSM Skin: warm and dry  Lab Results:  Recent Labs    05/20/20 0607 05/21/20 0245  WBC 5.6 5.1  HGB 9.5* 9.4*  HCT 30.2* 30.7*  PLT 374 397   BMET Recent Labs    05/20/20 0607 05/21/20 0245  NA 137 136  K 4.0 4.1  CL 104 105  CO2 22 20*  GLUCOSE 137* 154*  BUN 97* 99*  CREATININE 2.75* 2.70*  CALCIUM 8.6* 8.6*   PT/INR No results for input(s): LABPROT, INR in the last 72 hours. CMP     Component Value Date/Time   NA 136 05/21/2020 0245   K 4.1 05/21/2020 0245   CL 105 05/21/2020 0245   CO2 20 (L) 05/21/2020 0245   GLUCOSE 154 (H) 05/21/2020 0245   BUN 99 (H) 05/21/2020 0245   CREATININE 2.70 (H) 05/21/2020 0245   CREATININE 8.80 (H) 07/10/2014 1332   CALCIUM 8.6 (L) 05/21/2020 0245   PROT 5.3 (L) 05/02/2020 0106   PROT 7.3 10/13/2015 1654   ALBUMIN 1.3 (L) 05/21/2020 0245   AST 21 05/02/2020 0106   ALT 10 05/02/2020 0106   ALKPHOS 301 (H) 05/02/2020 0106   BILITOT 0.9 05/02/2020 0106   GFRNONAA 19 (L) 05/21/2020 0245   GFRAA 21 (L) 05/21/2020 0245   Lipase     Component Value Date/Time   LIPASE <10 (L) 10/07/2016 1718       Studies/Results: No  results found.  Anti-infectives: Anti-infectives (From admission, onward)   Start     Dose/Rate Route Frequency Ordered Stop   05/18/20 1030  meropenem (MERREM) 1 g in sodium chloride 0.9 % 100 mL IVPB        1 g 200 mL/hr over 30 Minutes Intravenous Every 12 hours 05/18/20 1001     05/16/20 1400  ceFEPIme (MAXIPIME) 2 g in sodium chloride 0.9 % 100 mL IVPB  Status:  Discontinued        2 g 200 mL/hr over 30 Minutes Intravenous Every 24 hours 05/16/20 1351 05/18/20 1001   05/07/20 1400  vancomycin (VANCOCIN) IVPB 1000 mg/200 mL premix        1,000 mg 200 mL/hr over 60 Minutes Intravenous On call to O.R. 05/07/20 0741 05/07/20 1550   05/05/20 1330  piperacillin-tazobactam (ZOSYN) IVPB 3.375 g  Status:  Discontinued        3.375 g 12.5 mL/hr over 240 Minutes Intravenous Every 8 hours 05/05/20 1251 05/12/20 1250   05/02/20 1600  vancomycin (VANCOREADY) IVPB 750 mg/150 mL  Status:  Discontinued        750 mg 150 mL/hr over 60 Minutes Intravenous Every 24 hours 05/01/20 1612 05/05/20 1251   05/02/20 1600  cefTRIAXone (ROCEPHIN) 2 g in sodium chloride 0.9 % 100 mL IVPB  Status:  Discontinued        2 g 200 mL/hr over 30 Minutes Intravenous Every 24 hours 05/01/20 1700 05/05/20 1250   05/01/20 1715  valGANciclovir (VALCYTE) 450 MG tablet TABS 450 mg  Status:  Discontinued        450 mg Oral Daily 05/01/20 1700 05/01/20 1847   05/01/20 1500  cefTRIAXone (ROCEPHIN) 2 g in sodium chloride 0.9 % 100 mL IVPB        2 g 200 mL/hr over 30 Minutes Intravenous  Once 05/01/20 1452 05/01/20 1638   05/01/20 1500  vancomycin (VANCOREADY) IVPB 1500 mg/300 mL        1,500 mg 150 mL/hr over 120 Minutes Intravenous  Once 05/01/20 1453 05/01/20 1755   05/01/20 1445  vancomycin (VANCOCIN) IVPB 1000 mg/200 mL premix  Status:  Discontinued        1,000 mg 200 mL/hr over 60 Minutes Intravenous  Once 05/01/20 1444 05/01/20 1453       Assessment/Plan DM HTN PAF on eliquis (last dose 8/16 in  AM) Hypothyroidism ESRD s/p renal transplant 2013 that failed and repeat renal transplant 06/2019 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 Excell Seltzer   Failure to thrive Dysphagia - Request for surgical G tube placement since patient is not a candidate for IR percutaneous G tube.  - Although she is not taking in a lot at times, she is tolerating dysphagia diet. She has a history of gastric sleeve and will not tolerate large quantities at a time. She drank Ensure x2 yesterday. Would not recommend surgical G tube in a patient that is tolerating a diet. Continue to encourage PO intake, small frequent meals. Increase Ensure to 3-4x per day. Continue appetite stimulant. General surgery will sign off, please call with concerns.   ID - currently meropenem 8/16>> VTE - SCDs, eliquis FEN - TF, dysphagia 3 diet Foley - none Follow up - TBD   LOS: 20 days    [place], [facility] Surgery 05/21/2020, 9:25 AM Please see Amion for pager number during day hours 7:00am-4:30pm

## 2020-05-22 LAB — TYPE AND SCREEN
ABO/RH(D): A POS
Antibody Screen: NEGATIVE
Unit division: 0
Unit division: 0

## 2020-05-22 LAB — CBC WITH DIFFERENTIAL/PLATELET
Abs Immature Granulocytes: 0.21 10*3/uL — ABNORMAL HIGH (ref 0.00–0.07)
Basophils Absolute: 0 10*3/uL (ref 0.0–0.1)
Basophils Relative: 0 %
Eosinophils Absolute: 0.1 10*3/uL (ref 0.0–0.5)
Eosinophils Relative: 2 %
HCT: 32.7 % — ABNORMAL LOW (ref 36.0–46.0)
Hemoglobin: 10 g/dL — ABNORMAL LOW (ref 12.0–15.0)
Immature Granulocytes: 4 %
Lymphocytes Relative: 30 %
Lymphs Abs: 1.8 10*3/uL (ref 0.7–4.0)
MCH: 29.6 pg (ref 26.0–34.0)
MCHC: 30.6 g/dL (ref 30.0–36.0)
MCV: 96.7 fL (ref 80.0–100.0)
Monocytes Absolute: 0.5 10*3/uL (ref 0.1–1.0)
Monocytes Relative: 8 %
Neutro Abs: 3.3 10*3/uL (ref 1.7–7.7)
Neutrophils Relative %: 56 %
Platelets: 482 10*3/uL — ABNORMAL HIGH (ref 150–400)
RBC: 3.38 MIL/uL — ABNORMAL LOW (ref 3.87–5.11)
RDW: 18.5 % — ABNORMAL HIGH (ref 11.5–15.5)
WBC: 5.8 10*3/uL (ref 4.0–10.5)
nRBC: 0 % (ref 0.0–0.2)

## 2020-05-22 LAB — RENAL FUNCTION PANEL
Albumin: 1.3 g/dL — ABNORMAL LOW (ref 3.5–5.0)
Anion gap: 10 (ref 5–15)
BUN: 100 mg/dL — ABNORMAL HIGH (ref 6–20)
CO2: 20 mmol/L — ABNORMAL LOW (ref 22–32)
Calcium: 8.5 mg/dL — ABNORMAL LOW (ref 8.9–10.3)
Chloride: 107 mmol/L (ref 98–111)
Creatinine, Ser: 2.49 mg/dL — ABNORMAL HIGH (ref 0.44–1.00)
GFR calc Af Amer: 24 mL/min — ABNORMAL LOW (ref >60)
GFR calc non Af Amer: 20 mL/min — ABNORMAL LOW (ref >60)
Glucose, Bld: 116 mg/dL — ABNORMAL HIGH (ref 70–99)
Phosphorus: 4 mg/dL (ref 2.5–4.6)
Potassium: 4.1 mmol/L (ref 3.5–5.1)
Sodium: 137 mmol/L (ref 135–145)

## 2020-05-22 LAB — GLUCOSE, CAPILLARY
Glucose-Capillary: 106 mg/dL — ABNORMAL HIGH (ref 70–99)
Glucose-Capillary: 125 mg/dL — ABNORMAL HIGH (ref 70–99)
Glucose-Capillary: 131 mg/dL — ABNORMAL HIGH (ref 70–99)
Glucose-Capillary: 138 mg/dL — ABNORMAL HIGH (ref 70–99)
Glucose-Capillary: 148 mg/dL — ABNORMAL HIGH (ref 70–99)
Glucose-Capillary: 97 mg/dL (ref 70–99)

## 2020-05-22 LAB — BPAM RBC
Blood Product Expiration Date: 202109042359
Blood Product Expiration Date: 202109042359
ISSUE DATE / TIME: 202108170422
Unit Type and Rh: 6200
Unit Type and Rh: 6200

## 2020-05-22 LAB — MAGNESIUM: Magnesium: 2.6 mg/dL — ABNORMAL HIGH (ref 1.7–2.4)

## 2020-05-22 LAB — C-REACTIVE PROTEIN: CRP: 9.8 mg/dL — ABNORMAL HIGH (ref <1.0)
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
## 2020-05-22 NOTE — Progress Notes (Signed)
   Palliative Medicine Inpatient Follow Up Note  Reason for consult:  Goals of Care  HPI:  Per intake H&P --> 59 year old lady with prior history of diabetes, hypertension, paroxysmal atrial fibrillation on Eliquis, hyperlipidemia, end-stage renal disease s/p failed kidney transplant in 2013 and repeat renal transplant in September 2020, stage IV CKD, anemia of chronic disease, history of gastric sleeve surgery, hypoalbuminemia, right foot ulcer, chronic neuropathy presents with right foot ulcer, fevers. X-rays of the foot did not show any evidence of osteomyelitis she was started on IV antibiotics and fluids.  Palliative care was consulted for goals of care conversations in the setting of poor oral intake and muscular deconditioning.   Today's Discussion (05/22/2020): Chart reviewed. I met with Wanda [name] at bedside this afternoon. She was alert and oriented. She and I discussed her present appetite which remains to be poor. She is not a strong surgical candidate for surgery d/t her surgical sleeve. She had been started on mirtazapine on 8/16. Patient remains to want to go to rehabilitation to see if she can improve her present situation. Not at a place where she would like to change her course of care or goals.   Discussed the importance of continued conversation with family and their  medical providers regarding overall plan of care and treatment options, ensuring decisions are within the context of the patients values and GOCs.  Questions and concerns addressed   Decision Maker: Wanda [name] (Husband) [phone]  SUMMARY OF RECOMMENDATIONS Full code / Full scope    Spiritual Support  Ongoing PMT Support  PMT will remain peripherally involved given the present goals of the patient - Please contact the team with any urgent needs  Code Status/Advance Care Planning: FULL CODE  Symptom Management: Failure to Thrive:                 - Dietician involved                 -  Family would be interested a G-Tube but not needed per surgery eval (c/b gastric sleeve)                 - Supplemental nutrients  - Mirtazapine  Pain, generalized:                 - Tyelnol $RemoveB'650mg'QeYAalAL$  Q6H PRN                 - Oxycodone $RemoveBef'5mg'bCYNWcMlKz$  PO Q4H PRN                 - Fentanyl 12.5-50mcg Q2H PRN - Please given instead of morphine given CKD  - Morphine IV DC'd                 - Turn Q2H  Muscular Deconditioning:                 - PT/OT  Time Spent: 25 Greater than 50% of the time was spent in counseling and coordination of care ______________________________________________________________________________________ Enterprise Team Team Cell Phone: [phone] Please utilize secure chat with additional questions, if there is no response within 30 minutes please call the above phone number  Palliative Medicine Team providers are available by phone from 7am to 7pm daily and can be reached through the team cell phone.  Should this patient require assistance outside of these hours, please call the patient's attending physician.

## 2020-05-22 NOTE — Progress Notes (Signed)
[facility] PROGRESS NOTE    Progress Note  Wanda [name]  TKZ:[id number] DOB: 1960/11/12 DOA: 05/01/2020 PCP: Lorene Dy, MD     Brief Narrative:   Wanda [name] is an 59 y.o. 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  X-ray did not show any evidence of osteomyelitis, she was started on empiric antibiotics and IV fluids, orthopedic surgery was consulted and she underwent a BKA.  Due to significant poor oral intake a core track was placed and she was started on tube feedings.  As per RN the patient has been eating 25 to 50% of her regular meals.  Palliative care was consulted and they met with family and they would like to continue to aggressively treat the treatable and if no improvement then they would think about hospice.  Hospital course was complicated by fever and Klebsiella bacteremia which is the source has remained unclear.  Chest x-ray does not show any infection.  MRI of the foot does not show osteomyelitis.  A-line was removed on 05/17/2020.  She was started empirically on IV cefepime.  ID was consulted who recommended to change her antibiotics to IV meropenem.  A TTE showed possible mitral valve vegetation, TEE was ordered but patient became hypotensive prior to procedure in the TEE was unable to be completed. ID recommended at least 6 weeks of empiric antibiotics.  Meanwhile general surgery was consulted for G-tube placement due to her significant poor oral intake and deconditioning, IR was unable to place PEG tube due to her gastric sleeve.  General surgery recommended to decrease her tube feedings and encourage oral intake.  Assessment/Plan:   Septic shock  [facility]): Right foot ulcer with surrounding cellulitis she completed a course of IV Zosyn. MRI did not show any evidence of osteomyelitis but it did show a superficial phlegmon on the posterior calcaneus. 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 Skilled therapy evaluated the patient recommended skilled nursing facility.   She started having fever and left foot pain on 05/15/2020, and x-ray of the foot did not show any evidence of osteomyelitis, blood cultures were positive for Klebsiella on 05/15/2020 she was started on IV cefepime which was later changed to IV meropenem, ID was consulted and the source of infection remains unclear. An MRI of the foot without contrast did not show osteomyelitis but it did show superficial ulceration over the posterior calcaneus with heel pad inflammation no loculated fluid or collection.  Her CRP was 13 procalcitonin was 5 ID was consulted who recommended TTE that showed possible mitral vegetation, TEE was scheduled but she became hypotensive and was deemed unsafe to do so. ID recommended to continue empiric antibiotics for 6 weeks. Start date 05/16/2020.  Hypotension: Currently asymptomatic continue to monitor closely.  Chronic kidney disease IV: Creatinine stable at 2.5 continue to monitor intermittently continue bicarbonate tablets.  Hyperkalemia with peaked T waves: Nephrology was consulted he was treated with [facility] her potassium has improved.  Severe protein caloric malnutrition/cachexia: Core track was placed and feeding tube was started.   We were unable to place a PEG tube due to her gastric sleeve. Surgery was consulted recommended to decrease tube feedings and encourage oral intake. She ate about 25% of her food over the last 24  hours with discontinue tube feedings keep NG tube in place.  Paroxysmal atrial fibrillation:  Currently on amiodarone 200 and Eliquis.  Her chads Vascor is greater than 3.  Cachexia: Nutrition  has been consulted continue tube feedings per core track.  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  Klebsiella bacteremia: As stated above, echo showed vegetation TEE could not be performed as she became hypotensive ID recommended to continue meropenem for at least 6 weeks.   RN Pressure Injury Documentation: Pressure Injury 05/01/20 Heel Left Unstageable - Full thickness tissue loss in which the base of the injury is covered by slough (yellow, tan, gray, green or brown) and/or eschar (tan, brown or black) in the wound bed. dark discoloration unstageable bugg (Active)  05/01/20 2330  Location: Heel  Location Orientation: Left  Staging: Unstageable - Full thickness tissue loss in which the base of the injury is covered by slough (yellow, tan, gray, green or brown) and/or eschar (tan, brown or black) in the wound bed.  Wound Description (Comments): dark discoloration unstageable buggy  Present on Admission: Yes    Estimated body mass index is 22.74 kg/m as calculated from the following:   Height as of this encounter: '5\' 9"'  (1.753 m).   Weight as of this encounter: 69.9 kg. Malnutrition Type:  Nutrition Problem: Severe Malnutrition Etiology: chronic illness   Malnutrition Characteristics:  Signs/Symptoms: moderate fat depletion, severe muscle depletion, percent weight loss   Nutrition Interventions:  Interventions: Prostat, Ensure Enlive (each supplement provides 350kcal and 20 grams of protein)    DVT prophylaxis: eliquis Family Communication:none Status is: Inpatient  Remains inpatient appropriate because:Unsafe d/c plan   Dispo: The patient is from: Home              Anticipated d/c is to: SNF              Anticipated d/c date is: 3 days              Patient currently is not medically stable to d/c.  She needs to continue to eat most of her food once she is doing this we  could probably discontinue the core track and try to get her to a skilled nursing facility I believe that if she continues to eat 100% of her food her core track can be discontinued on 05/22/2020 as she has eaten 100% of her food over the last 24 hours.    Code Status:     Code Status Orders  (From admission, onward)         Start     Ordered   05/07/20 1811  Full code  Continuous        05/07/20 1810        Code Status History    Date Active Date Inactive Code Status Order ID Comments User Context   05/01/2020 1701 05/07/2020 1810 Full Code [id number]  Aline August, MD ED   02/04/2020 2312 02/11/2020 1741 Full Code [id number]  Truddie Hidden, MD Inpatient   10/07/2016 2102 10/12/2016 0037 Full Code [id number]  Toy Baker, MD Inpatient   05/26/2015 1630 05/28/2015 1712 Full Code [id number]  Excell Seltzer, MD Inpatient   07/24/2013 1832 07/26/2013 1625 Full Code [id number]  Barton Dubois, MD Inpatient   01/08/2013 2341 01/11/2013 2238 Full Code [id number]  Rise Patience, MD Inpatient   09/04/2012 1841 09/08/2012 0238 Full Code [id number]  Rosero, Lawana Pai, RN Inpatient   Advance  Care Planning Activity        IV Access:    Peripheral IV   Procedures and diagnostic studies:   No results found.   Medical Consultants:    None.  Anti-Infectives:   IV meropenem  Subjective:    Hermelinda Dellen he is not tolerated her diet this morning she relates she is not hungry..  Objective:    Vitals:   05/22/20 0020 05/22/20 0355 05/22/20 0423 05/22/20 0700  BP: (!) 133/49 136/72  135/67  Pulse: (!) 105 (!) 104  (!) 102  Resp: (!) '23 20 20 ' (!) 23  Temp: 98.5 F (36.9 C) 98.6 F (37 C)  97.7 F (36.5 C)  TempSrc: Oral Oral  Oral  SpO2: 99% 100%  100%  Weight:   69.9 kg   Height:       SpO2: 100 % O2 Flow Rate (L/min): 2 L/min   Intake/Output Summary (Last 24 hours) at 05/22/2020 0906 Last data filed at 05/22/2020 0027 Gross per 24 hour  Intake 1105 ml  Output  --  Net 1105 ml   Filed Weights   05/18/20 0404 05/19/20 0524 05/22/20 0423  Weight: 70.3 kg 71.4 kg 69.9 kg    Exam: General exam: In no acute distress. Respiratory system: Good air movement and clear to auscultation. Cardiovascular system: S1 & S2 heard, RRR. No JVD. Gastrointestinal system: Abdomen is nondistended, soft and nontender.  Extremities: No pedal edema. Skin: No rashes, lesions or ulcers  Data Reviewed:    Labs: Basic Metabolic Panel: Recent Labs  Lab 05/18/20 0136 05/18/20 0136 05/19/20 1594 05/19/20 0945 05/19/20 0945 05/20/20 0607 05/20/20 0607 05/21/20 0245 05/22/20 0319  NA 136  --   --  136  --  137  --  136 137  K 4.3   < >  --  4.6   < > 4.0   < > 4.1 4.1  CL 102  --   --  102  --  104  --  105 107  CO2 21*  --   --  23  --  22  --  20* 20*  GLUCOSE 170*  --   --  168*  --  137*  --  154* 116*  BUN 98*  --   --  100*  --  97*  --  99* 100*  CREATININE 2.54*  --   --  2.68*  --  2.75*  --  2.70* 2.49*  CALCIUM 8.4*  --   --  8.6*  --  8.6*  --  8.6* 8.5*  MG 2.2  --  2.2  --   --  2.3  --  2.4 2.6*  PHOS 5.1*  --   --  3.9  --  3.6  --  3.8 4.0   < > = values in this interval not displayed.   GFR Estimated Creatinine Clearance: 25.4 mL/min (A) (by C-G formula based on SCr of 2.49 mg/dL (H)). Liver Function Tests: Recent Labs  Lab 05/18/20 0136 05/19/20 0945 05/20/20 0607 05/21/20 0245 05/22/20 0319  ALBUMIN 1.1* 1.2* 1.2* 1.3* 1.3*   No results for input(s): LIPASE, AMYLASE in the last 168 hours. No results for input(s): AMMONIA in the last 168 hours. Coagulation profile No results for input(s): INR, PROTIME in the last 168 hours. COVID-19 Labs  Recent Labs    05/20/20 0607 05/21/20 0245 05/22/20 0319  CRP 13.5* 12.2* 9.8*    Lab Results  Component Value Date   SARSCOV2NAA  NEGATIVE 05/01/2020   SARSCOV2NAA NEGATIVE 02/04/2020   [place] NEGATIVE 12/15/2019   [place] NEGATIVE 03/15/2019    CBC: Recent Labs  Lab  05/18/20 0136 05/19/20 0939 05/20/20 0607 05/21/20 0245 05/22/20 0319  WBC 6.0 5.7 5.6 5.1 5.8  NEUTROABS 4.4 4.7 3.6 3.2 3.3  HGB 7.1* 9.0* 9.5* 9.4* 10.0*  HCT 23.0* 28.3* 30.2* 30.7* 32.7*  MCV 97.0 95.0 95.3 97.8 96.7  PLT 255 338 374 397 482*   Cardiac Enzymes: No results for input(s): CKTOTAL, CKMB, CKMBINDEX, TROPONINI in the last 168 hours. BNP (last 3 results) No results for input(s): PROBNP in the last 8760 hours. CBG: Recent Labs  Lab 05/21/20 1621 05/21/20 2050 05/22/20 0037 05/22/20 0427 05/22/20 0750  GLUCAP 149* 160* 131* 138* 125*   D-Dimer: No results for input(s): DDIMER in the last 72 hours. Hgb A1c: No results for input(s): HGBA1C in the last 72 hours. Lipid Profile: No results for input(s): CHOL, HDL, LDLCALC, TRIG, CHOLHDL, LDLDIRECT in the last 72 hours. Thyroid function studies: No results for input(s): TSH, T4TOTAL, T3FREE, THYROIDAB in the last 72 hours.  Invalid input(s): FREET3 Anemia work up: No results for input(s): VITAMINB12, FOLATE, FERRITIN, TIBC, IRON, RETICCTPCT in the last 72 hours. Sepsis Labs: Recent Labs  Lab 05/16/20 0500 05/16/20 1458 05/17/20 0441 05/17/20 0441 05/18/20 0136 05/18/20 0136 05/19/20 0939 05/20/20 0607 05/21/20 0245 05/22/20 0319  PROCALCITON  --  3.05 4.19  --  5.18  --   --   --   --   --   WBC   < >  --  7.8   < > 6.0   < > 5.7 5.6 5.1 5.8   < > = values in this interval not displayed.   Microbiology Recent Results (from the past 240 hour(s))  Culture, blood (Routine X 2) w Reflex to ID Panel     Status: Abnormal   Collection Time: 05/15/20 10:21 PM   Specimen: BLOOD LEFT HAND  Result Value Ref Range Status   Specimen Description BLOOD LEFT HAND  Final   Special Requests   Final    BOTTLES DRAWN AEROBIC AND ANAEROBIC Blood Culture adequate volume   Culture  Setup Time   Final    GRAM NEGATIVE RODS IN BOTH AEROBIC AND ANAEROBIC BOTTLES CRITICAL RESULT CALLED TO, READ BACK BY AND VERIFIED  WITH: C. KO PHARMD, AT 1610 05/16/20 BY D. VANHOOK Performed at [facility], [place] [address], [place],  [address]    Culture ENTEROBACTER AEROGENES (A)  Final   Report Status 05/18/2020 FINAL  Final   Organism ID, Bacteria ENTEROBACTER AEROGENES  Final      Susceptibility   Enterobacter aerogenes - MIC*    CEFAZOLIN >=64 RESISTANT Resistant     CEFEPIME 1 SENSITIVE Sensitive     CEFTAZIDIME >=64 RESISTANT Resistant     CEFTRIAXONE >=64 RESISTANT Resistant     CIPROFLOXACIN <=0.25 SENSITIVE Sensitive     GENTAMICIN <=1 SENSITIVE Sensitive     IMIPENEM 0.5 SENSITIVE Sensitive     TRIMETH/SULFA <=20 SENSITIVE Sensitive     PIP/TAZO >=128 RESISTANT Resistant     * ENTEROBACTER AEROGENES  Blood Culture ID Panel (Reflexed)     Status: Abnormal   Collection Time: 05/15/20 10:21 PM  Result Value Ref Range Status   Enterococcus faecalis NOT DETECTED NOT DETECTED Final   Enterococcus Faecium NOT DETECTED NOT DETECTED Final   Listeria monocytogenes NOT DETECTED NOT DETECTED Final   Staphylococcus species NOT DETECTED NOT  DETECTED Final   Staphylococcus aureus (BCID) NOT DETECTED NOT DETECTED Final   Staphylococcus epidermidis NOT DETECTED NOT DETECTED Final   Staphylococcus lugdunensis NOT DETECTED NOT DETECTED Final   Streptococcus species NOT DETECTED NOT DETECTED Final   Streptococcus agalactiae NOT DETECTED NOT DETECTED Final   Streptococcus pneumoniae NOT DETECTED NOT DETECTED Final   Streptococcus pyogenes NOT DETECTED NOT DETECTED Final   A.calcoaceticus-baumannii NOT DETECTED NOT DETECTED Final   Bacteroides fragilis NOT DETECTED NOT DETECTED Final   Enterobacterales DETECTED (A) NOT DETECTED Final    Comment: Enterobacterales represent a large order of gram negative bacteria, not a single organism. CRITICAL RESULT CALLED TO, READ BACK BY AND VERIFIED WITH: C. KO PHARMD, AT 1333 05/16/20 BY D. VANHOOK    Enterobacter cloacae complex NOT DETECTED NOT DETECTED Final    Escherichia coli NOT DETECTED NOT DETECTED Final   Klebsiella aerogenes DETECTED (A) NOT DETECTED Final    Comment: CRITICAL RESULT CALLED TO, READ BACK BY AND VERIFIED WITH: C. KO PHARMD, AT 1333 05/16/20 BY D. VANHOOK    Klebsiella oxytoca NOT DETECTED NOT DETECTED Final   Klebsiella pneumoniae NOT DETECTED NOT DETECTED Final   Proteus species NOT DETECTED NOT DETECTED Final   Salmonella species NOT DETECTED NOT DETECTED Final   Serratia marcescens NOT DETECTED NOT DETECTED Final   Haemophilus influenzae NOT DETECTED NOT DETECTED Final   Neisseria meningitidis NOT DETECTED NOT DETECTED Final   Pseudomonas aeruginosa NOT DETECTED NOT DETECTED Final   Stenotrophomonas maltophilia NOT DETECTED NOT DETECTED Final   Candida albicans NOT DETECTED NOT DETECTED Final   Candida auris NOT DETECTED NOT DETECTED Final   Candida glabrata NOT DETECTED NOT DETECTED Final   Candida krusei NOT DETECTED NOT DETECTED Final   Candida parapsilosis NOT DETECTED NOT DETECTED Final   Candida tropicalis NOT DETECTED NOT DETECTED Final   Cryptococcus neoformans/gattii NOT DETECTED NOT DETECTED Final   CTX-M ESBL NOT DETECTED NOT DETECTED Final   Carbapenem resistance IMP NOT DETECTED NOT DETECTED Final   Carbapenem resistance KPC NOT DETECTED NOT DETECTED Final   Carbapenem resistance NDM NOT DETECTED NOT DETECTED Final   Carbapenem resist OXA 48 LIKE NOT DETECTED NOT DETECTED Final   Carbapenem resistance VIM NOT DETECTED NOT DETECTED Final    Comment: Performed at [facility], [address], [place], [place] [address]  Culture, blood (Routine X 2) w Reflex to ID Panel     Status: Abnormal   Collection Time: 05/15/20 10:35 PM   Specimen: BLOOD LEFT ARM  Result Value Ref Range Status   Specimen Description BLOOD LEFT ARM  Final   Special Requests   Final    BOTTLES DRAWN AEROBIC AND ANAEROBIC Blood Culture adequate volume   Culture  Setup Time   Final    GRAM NEGATIVE RODS IN BOTH AEROBIC AND  ANAEROBIC BOTTLES CRITICAL VALUE NOTED.  VALUE IS CONSISTENT WITH PREVIOUSLY REPORTED AND CALLED VALUE.    Culture (A)  Final    ENTEROBACTER AEROGENES SUSCEPTIBILITIES PERFORMED ON PREVIOUS CULTURE WITHIN THE LAST 5 DAYS. Performed at [facility], [place] [address], [place], [place] [address]    Report Status 05/18/2020 FINAL  Final     Medications:    sodium chloride   Intravenous Once   amiodarone  200 mg Oral Daily   apixaban  5 mg Oral BID   Chlorhexidine Gluconate Cloth  6 each Topical Daily   darbepoetin (ARANESP) injection - NON-DIALYSIS  150 mcg Subcutaneous Q Mon-1800  feeding supplement (ENSURE ENLIVE)  237 mL Oral TID BM   feeding supplement (NEPRO CARB STEADY)  1,000 mL Per Tube Q24H   feeding supplement (PROSource TF)  90 mL Per Tube BID   folic acid  1 mg Oral Daily   insulin aspart  0-15 Units Subcutaneous TID WC   insulin aspart  0-5 Units Subcutaneous QHS   insulin aspart  4 Units Subcutaneous Q4H   lidocaine  5 mL Intradermal Once   mirtazapine  7.5 mg Oral QHS   multivitamin with minerals  1 tablet Oral Daily   nortriptyline  25 mg Oral QHS   pantoprazole  40 mg Oral Daily   predniSONE  5 mg Oral Q breakfast   senna-docusate  1 tablet Oral Daily   sodium bicarbonate  650 mg Oral BID   sodium zirconium cyclosilicate  10 g Oral BID   tacrolimus  1.5 mg Oral BID   thiamine  100 mg Oral Daily   vitamin C  250 mg Oral BID   zinc sulfate  220 mg Oral Daily   Continuous Infusions:  sodium chloride 250 mL (05/16/20 1537)   meropenem (MERREM) IV 1 g (05/21/20 2117)      LOS: 21 days   Charlynne Cousins  [facility]  05/22/2020, 9:06 AM
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
## 2020-05-23 DIAGNOSIS — F320 Major depressive disorder, single episode, mild: Secondary | ICD-10-CM

## 2020-05-23 DIAGNOSIS — R6251 Failure to thrive (child): Secondary | ICD-10-CM

## 2020-05-23 LAB — RENAL FUNCTION PANEL
Albumin: 1.3 g/dL — ABNORMAL LOW (ref 3.5–5.0)
Anion gap: 12 (ref 5–15)
BUN: 96 mg/dL — ABNORMAL HIGH (ref 6–20)
CO2: 20 mmol/L — ABNORMAL LOW (ref 22–32)
Calcium: 8.7 mg/dL — ABNORMAL LOW (ref 8.9–10.3)
Chloride: 107 mmol/L (ref 98–111)
Creatinine, Ser: 2.62 mg/dL — ABNORMAL HIGH (ref 0.44–1.00)
GFR calc Af Amer: 22 mL/min — ABNORMAL LOW (ref >60)
GFR calc non Af Amer: 19 mL/min — ABNORMAL LOW (ref >60)
Glucose, Bld: 122 mg/dL — ABNORMAL HIGH (ref 70–99)
Phosphorus: 4.4 mg/dL (ref 2.5–4.6)
Potassium: 3.8 mmol/L (ref 3.5–5.1)
Sodium: 139 mmol/L (ref 135–145)

## 2020-05-23 LAB — CBC WITH DIFFERENTIAL/PLATELET
Abs Immature Granulocytes: 0.21 10*3/uL — ABNORMAL HIGH (ref 0.00–0.07)
Basophils Absolute: 0 10*3/uL (ref 0.0–0.1)
Basophils Relative: 1 %
Eosinophils Absolute: 0.1 10*3/uL (ref 0.0–0.5)
Eosinophils Relative: 2 %
HCT: 34.1 % — ABNORMAL LOW (ref 36.0–46.0)
Hemoglobin: 10.7 g/dL — ABNORMAL LOW (ref 12.0–15.0)
Immature Granulocytes: 3 %
Lymphocytes Relative: 29 %
Lymphs Abs: 1.9 10*3/uL (ref 0.7–4.0)
MCH: 30.2 pg (ref 26.0–34.0)
MCHC: 31.4 g/dL (ref 30.0–36.0)
MCV: 96.3 fL (ref 80.0–100.0)
Monocytes Absolute: 0.5 10*3/uL (ref 0.1–1.0)
Monocytes Relative: 7 %
Neutro Abs: 3.7 10*3/uL (ref 1.7–7.7)
Neutrophils Relative %: 58 %
Platelets: 545 10*3/uL — ABNORMAL HIGH (ref 150–400)
RBC: 3.54 MIL/uL — ABNORMAL LOW (ref 3.87–5.11)
RDW: 18.1 % — ABNORMAL HIGH (ref 11.5–15.5)
WBC: 6.3 10*3/uL (ref 4.0–10.5)
nRBC: 0 % (ref 0.0–0.2)

## 2020-05-23 LAB — GLUCOSE, CAPILLARY
Glucose-Capillary: 100 mg/dL — ABNORMAL HIGH (ref 70–99)
Glucose-Capillary: 103 mg/dL — ABNORMAL HIGH (ref 70–99)
Glucose-Capillary: 109 mg/dL — ABNORMAL HIGH (ref 70–99)
Glucose-Capillary: 125 mg/dL — ABNORMAL HIGH (ref 70–99)
Glucose-Capillary: 127 mg/dL — ABNORMAL HIGH (ref 70–99)
Glucose-Capillary: 129 mg/dL — ABNORMAL HIGH (ref 70–99)
Glucose-Capillary: 146 mg/dL — ABNORMAL HIGH (ref 70–99)

## 2020-05-23 LAB — C-REACTIVE PROTEIN: CRP: 7.5 mg/dL — ABNORMAL HIGH (ref <1.0)

## 2020-05-23 LAB — MAGNESIUM: Magnesium: 2.5 mg/dL — ABNORMAL HIGH (ref 1.7–2.4)

## 2020-05-23 NOTE — Progress Notes (Signed)
PROGRESS NOTE    [name]  XBM:[id number] DOB: [birth year] DOA: 05/01/2020 PCP: Lorene Dy, MD     Brief Narrative:  Wanda [name] is an 59 y.o. 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  X-ray did not show any evidence of osteomyelitis, she was started on empiric antibiotics and IV fluids, orthopedic surgery was consulted and she underwent a BKA.  Due to significant poor oral intake a core track was placed and she was started on tube feedings.  As per RN the patient has been eating 25 to 50% of her regular meals.  Palliative care was consulted and they met with family and they would like to continue to aggressively treat the treatable and if no improvement then they would think about hospice.  Hospital course was complicated by fever and Klebsiella bacteremia which is the source has remained unclear.  Chest x-ray does not show any infection.  MRI of the foot does not show osteomyelitis.  A-line was removed on 05/17/2020.  She was started empirically on IV cefepime.  ID was consulted who recommended to change her antibiotics to IV meropenem.  A TTE showed possible mitral valve vegetation, TEE was ordered but patient became hypotensive prior to procedure in the TEE was unable to be completed. ID recommended at least 6 weeks of empiric antibiotics.  Meanwhile general surgery was consulted for G-tube placement due to her significant poor oral intake and deconditioning, IR was unable to place PEG tube due to her gastric sleeve.  General surgery recommended to decrease her tube feedings and encourage oral intake.   Subjective: A/O x4, negative S OB, negative CP.  Positive RIGHT BKA pain which she just received medication  for.   Assessment & Plan: Covid vaccination; negative vaccination   Principal Problem:   Septic shock ([place]) Active Problems:   DM (diabetes mellitus), type 2 with renal complications ([place])   Essential hypertension   CHRONIC KIDNEY DISEASE STAGE IV ([place])   Renal transplant disorder   Depression   History of kidney transplant   Wound infection   Leukocytosis   Pressure injury of skin   Palliative care by specialist   Goals of care, counseling/discussion   DNR (do not resuscitate) discussion   Failure to thrive in adult   Pain of foot   Bacteremia due to Klebsiella pneumoniae   Septic shock [facility]): Right foot ulcer with surrounding cellulitis she completed a course of IV Zosyn. MRI did not show any evidence of osteomyelitis but it did show a superficial phlegmon on the posterior calcaneus. 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 Skilled therapy evaluated the patient recommended skilled nursing facility.   She started having fever and left foot pain on 05/15/2020, and x-ray of the foot did not show any evidence of osteomyelitis, blood cultures were positive for Klebsiella on 05/15/2020 she was started on IV cefepime which was later changed to IV meropenem, ID was consulted and the source of infection remains unclear. An MRI of the foot without contrast did not show osteomyelitis but it did show superficial ulceration over the posterior calcaneus with heel pad inflammation no loculated fluid or collection.  Her CRP was 13 procalcitonin was 5 ID was consulted who recommended TTE that showed possible mitral vegetation, TEE was  scheduled but she became hypotensive and was deemed unsafe to do so. ID recommended to continue empiric antibiotics for 6 weeks. Start date 05/16/2020.  Hypotension: Currently asymptomatic continue to monitor closely.  Chronic kidney disease IV: Creatinine stable at 2.5 continue to monitor intermittently continue bicarbonate  tablets.  Hyperkalemia with peaked T waves: -Nephrology was consulted  -Resolved he was treated with Lokelma  Severe protein caloric malnutrition/cachexia: -Core track was placed and feeding tube was started.   -Unable to place a PEG tube due to her gastric sleeve. -Surgery was consulted recommended to decrease tube feedings and encourage oral intake. She ate about 25% of her food over the last 24 hours with discontinue tube feedings keep NG tube in place.  Paroxysmal atrial fibrillation:  Currently on amiodarone 200 and Eliquis.  Her chads Vascor is greater than 3.  Cachexia: -Nutrition has been consulted continue tube feedings per core track.  DM type II controlled; DM neuropathy/DM neuropathy w 7/31 hemoglobin A1c = 4.1  Klebsiella Bacteremia: As stated above, echo showed vegetation TEE could not be performed as she became hypotensive ID recommended to continue meropenem for at least 6 weeks. -We will reconsult cardiology as patient's BP slightly elevated and see if they are willing to perform TEE -In the meantime continue current antibiotics last day 9/17  Pressure Injury 05/01/20 Heel Left Unstageable - Full thickness tissue loss in which the base of the injury is covered by slough (yellow, tan, gray, green or brown) and/or eschar (tan, brown or black) in the wound bed. dark discoloration unstageable bugg (Active)  05/01/20 2330  Location: Heel  Location Orientation: Left  Staging: Unstageable - Full thickness tissue loss in which the base of the injury is covered by slough (yellow, tan, gray, green or brown) and/or eschar (tan, brown or black) in the wound bed.  Wound Description (Comments): dark discoloration unstageable buggy  Present on Admission: Yes   Severe protein calorie malnutrition -Continue to encourage patient to eat and drink. -Marinol 5 mg BID     DVT prophylaxis: Eliquis Code Status: Full Family Communication:  Status is:  Inpatient  Stable  Dispo: The patient is from: Home              Anticipated d/c is to: Home              Anticipated d/c date is: Per surgery              Patient currently unstable      Consultants:  Palliative care [facility]   Procedures/Significant Events:    I have personally reviewed and interpreted all radiology studies and my findings are as above.  VENTILATOR SETTINGS:    Cultures   Antimicrobials: Anti-infectives (From admission, onward)   Start     Ordered Stop   05/18/20 1030  meropenem (MERREM) 1 g in sodium chloride 0.9 % 100 mL IVPB        05/18/20 1001     05/16/20 1400  ceFEPIme (MAXIPIME) 2 g in sodium chloride 0.9 % 100 mL IVPB  Status:  Discontinued        05/16/20 1351 05/18/20 1001   05/07/20 1400  vancomycin (VANCOCIN) IVPB 1000 mg/200 mL premix        05/07/20 0741 05/07/20 1550   05/05/20 1330  piperacillin-tazobactam (ZOSYN) IVPB 3.375 g  Status:  Discontinued        05/05/20 1251 05/12/20 1250   05/02/20 1600  vancomycin (VANCOREADY) IVPB 750 mg/150 mL  Status:  Discontinued        05/01/20 1612 05/05/20 1251   05/02/20 1600  cefTRIAXone (ROCEPHIN) 2 g in sodium chloride 0.9 % 100 mL IVPB  Status:  Discontinued        05/01/20 1700 05/05/20 1250   05/01/20 1715  valGANciclovir (VALCYTE) 450 MG tablet TABS 450 mg  Status:  Discontinued        05/01/20 1700 05/01/20 1847   05/01/20 1500  cefTRIAXone (ROCEPHIN) 2 g in sodium chloride 0.9 % 100 mL IVPB        05/01/20 1452 05/01/20 1638   05/01/20 1500  vancomycin (VANCOREADY) IVPB 1500 mg/300 mL        05/01/20 1453 05/01/20 1755   05/01/20 1445  vancomycin (VANCOCIN) IVPB 1000 mg/200 mL premix  Status:  Discontinued        05/01/20 1444 05/01/20 1453      Devices    LINES / TUBES:      Continuous Infusions: . sodium chloride 500 mL (05/22/20 2230)  . meropenem (MERREM) IV 1 g (05/23/20 0904)     Objective: Vitals:   05/23/20 0015 05/23/20 0432 05/23/20 0835 05/23/20 1135  BP:  (!) 140/52 (!) 147/59 (!) 151/69 (!) 152/66  Pulse: (!) 104 (!) 106 (!) 104 100  Resp: '20 20 19 ' (!) 21  Temp: 98.6 F (37 C) 98.4 F (36.9 C) 97.7 F (36.5 C) 98 F (36.7 C)  TempSrc: Oral Oral Oral Oral  SpO2: 95% 97% 97% 96%  Weight:      Height:        Intake/Output Summary (Last 24 hours) at 05/23/2020 1238 Last data filed at 05/22/2020 1752 Gross per 24 hour  Intake 222 ml  Output 600 ml  Net -378 ml   Filed Weights   05/18/20 0404 05/19/20 0524 05/22/20 0423  Weight: 70.3 kg 71.4 kg 69.9 kg    Examination:  General: A/O x4, No acute respiratory distress Eyes: negative scleral hemorrhage, negative anisocoria, negative icterus ENT: Negative Runny nose, negative gingival bleeding, Neck:  Negative scars, masses, torticollis, lymphadenopathy, JVD Lungs: Clear to auscultation bilaterally without wheezes or crackles Cardiovascular: Regular rate and rhythm without murmur gallop or rub normal S1 and S2 Abdomen: negative abdominal pain, nondistended, positive soft, bowel sounds, no rebound, no ascites, no appreciable mass Extremities: RIGHT BKA dressed with Ace wrap.  No sign of discharge did not take dressing down. Skin: Negative rashes, lesions, ulcers Psychiatric:  Negative depression, negative anxiety, negative fatigue, negative mania  Central nervous system:  Cranial nerves II through XII intact, tongue/uvula midline, all extremities muscle strength 5/5, sensation intact throughout, negative dysarthria, negative expressive aphasia, negative receptive aphasia.  .     Data Reviewed: Care during the described time interval was provided by me .  I have reviewed this patient's available data, including medical history, events of note, physical examination, and all test results as part of my evaluation.  CBC: Recent Labs  Lab 05/19/20 0939 05/20/20 0607 05/21/20 0245 05/22/20 0319 05/23/20 0147  WBC 5.7 5.6 5.1 5.8 6.3  NEUTROABS 4.7 3.6 3.2 3.3 3.7  HGB 9.0* 9.5*  9.4* 10.0* 10.7*  HCT 28.3* 30.2* 30.7* 32.7* 34.1*  MCV 95.0 95.3 97.8 96.7 96.3  PLT 338 374 397 482* 428*   Basic Metabolic Panel: Recent Labs  Lab 05/18/20 0136 05/19/20 0939 05/19/20 0945 05/20/20 0607 05/21/20 0245 05/22/20 0319 05/23/20 0147  NA   < >  --  136 137 136 137 139  K   < >  --  4.6 4.0 4.1 4.1 3.8  CL   < >  --  102 104 105 107 107  CO2   < >  --  23 22 20* 20* 20*  GLUCOSE   < >  --  168* 137* 154* 116* 122*  BUN   < >  --  100* 97* 99* 100* 96*  CREATININE   < >  --  2.68* 2.75* 2.70* 2.49* 2.62*  CALCIUM   < >  --  8.6* 8.6* 8.6* 8.5* 8.7*  MG  --  2.2  --  2.3 2.4 2.6* 2.5*  PHOS   < >  --  3.9 3.6 3.8 4.0 4.4   < > = values in this interval not displayed.   GFR: Estimated Creatinine Clearance: 24.2 mL/min (A) (by C-G formula based on SCr of 2.62 mg/dL (H)). Liver Function Tests: Recent Labs  Lab 05/19/20 0945 05/20/20 0607 05/21/20 0245 05/22/20 0319 05/23/20 0147  ALBUMIN 1.2* 1.2* 1.3* 1.3* 1.3*   No results for input(s): LIPASE, AMYLASE in the last 168 hours. No results for input(s): AMMONIA in the last 168 hours. Coagulation Profile: No results for input(s): INR, PROTIME in the last 168 hours. Cardiac Enzymes: No results for input(s): CKTOTAL, CKMB, CKMBINDEX, TROPONINI in the last 168 hours. BNP (last 3 results) No results for input(s): PROBNP in the last 8760 hours. HbA1C: No results for input(s): HGBA1C in the last 72 hours. CBG: Recent Labs  Lab 05/23/20 0012 05/23/20 0430 05/23/20 0838 05/23/20 1148 05/23/20 1150  GLUCAP 129* 109* 146* 125* 127*   Lipid Profile: No results for input(s): CHOL, HDL, LDLCALC, TRIG, CHOLHDL, LDLDIRECT in the last 72 hours. Thyroid Function Tests: No results for input(s): TSH, T4TOTAL, FREET4, T3FREE, THYROIDAB in the last 72 hours. Anemia Panel: No results for input(s): VITAMINB12, FOLATE, FERRITIN, TIBC, IRON, RETICCTPCT in the last 72 hours. Sepsis Labs: Recent Labs  Lab 05/16/20 1458  05/17/20 0441 05/18/20 0136  PROCALCITON 3.05 4.19 5.18    Recent Results (from the past 240 hour(s))  Culture, blood (Routine X 2) w Reflex to ID Panel     Status: Abnormal   Collection Time: 05/15/20 10:21 PM   Specimen: BLOOD LEFT HAND  Result Value Ref Range Status   Specimen Description BLOOD LEFT HAND  Final   Special Requests   Final    BOTTLES DRAWN AEROBIC AND ANAEROBIC Blood Culture adequate volume   Culture  Setup Time   Final    GRAM NEGATIVE RODS IN BOTH AEROBIC AND ANAEROBIC BOTTLES CRITICAL RESULT CALLED TO, READ BACK BY AND VERIFIED WITH: C. KO PHARMD, AT 1333 05/16/20 BY D. VANHOOK Performed at [facility], [place] [address] [place], [place] [address]    Culture ENTEROBACTER AEROGENES (A)  Final   Report Status 05/18/2020 FINAL  Final   Organism ID, Bacteria ENTEROBACTER AEROGENES  Final      Susceptibility   Enterobacter aerogenes - MIC*    CEFAZOLIN >=64 RESISTANT Resistant     CEFEPIME 1 SENSITIVE Sensitive     CEFTAZIDIME >=64 RESISTANT Resistant     CEFTRIAXONE >=64 RESISTANT Resistant     CIPROFLOXACIN <=0.25 SENSITIVE Sensitive     GENTAMICIN <=1 SENSITIVE Sensitive     IMIPENEM 0.5 SENSITIVE Sensitive     TRIMETH/SULFA <=20 SENSITIVE Sensitive     PIP/TAZO >=128 RESISTANT Resistant     * ENTEROBACTER AEROGENES  Blood Culture ID Panel (Reflexed)     Status: Abnormal   Collection Time: 05/15/20 10:21  PM  Result Value Ref Range Status   Enterococcus faecalis NOT DETECTED NOT DETECTED Final   Enterococcus Faecium NOT DETECTED NOT DETECTED Final   Listeria monocytogenes NOT DETECTED NOT DETECTED Final   Staphylococcus species NOT DETECTED NOT DETECTED Final   Staphylococcus aureus (BCID) NOT DETECTED NOT DETECTED Final   Staphylococcus epidermidis NOT DETECTED NOT DETECTED Final   Staphylococcus lugdunensis NOT DETECTED NOT DETECTED Final   Streptococcus species NOT DETECTED NOT DETECTED Final   Streptococcus agalactiae NOT DETECTED NOT DETECTED  Final   Streptococcus pneumoniae NOT DETECTED NOT DETECTED Final   Streptococcus pyogenes NOT DETECTED NOT DETECTED Final   A.calcoaceticus-baumannii NOT DETECTED NOT DETECTED Final   Bacteroides fragilis NOT DETECTED NOT DETECTED Final   Enterobacterales DETECTED (A) NOT DETECTED Final    Comment: Enterobacterales represent a large order of gram negative bacteria, not a single organism. CRITICAL RESULT CALLED TO, READ BACK BY AND VERIFIED WITH: C. KO PHARMD, AT 1333 05/16/20 BY D. VANHOOK    Enterobacter cloacae complex NOT DETECTED NOT DETECTED Final   Escherichia coli NOT DETECTED NOT DETECTED Final   Klebsiella aerogenes DETECTED (A) NOT DETECTED Final    Comment: CRITICAL RESULT CALLED TO, READ BACK BY AND VERIFIED WITH: C. KO PHARMD, AT 1333 05/16/20 BY D. VANHOOK    Klebsiella oxytoca NOT DETECTED NOT DETECTED Final   Klebsiella pneumoniae NOT DETECTED NOT DETECTED Final   Proteus species NOT DETECTED NOT DETECTED Final   Salmonella species NOT DETECTED NOT DETECTED Final   Serratia marcescens NOT DETECTED NOT DETECTED Final   Haemophilus influenzae NOT DETECTED NOT DETECTED Final   Neisseria meningitidis NOT DETECTED NOT DETECTED Final   Pseudomonas aeruginosa NOT DETECTED NOT DETECTED Final   Stenotrophomonas maltophilia NOT DETECTED NOT DETECTED Final   Candida albicans NOT DETECTED NOT DETECTED Final   Candida auris NOT DETECTED NOT DETECTED Final   Candida glabrata NOT DETECTED NOT DETECTED Final   Candida krusei NOT DETECTED NOT DETECTED Final   Candida parapsilosis NOT DETECTED NOT DETECTED Final   Candida tropicalis NOT DETECTED NOT DETECTED Final   Cryptococcus neoformans/gattii NOT DETECTED NOT DETECTED Final   CTX-M ESBL NOT DETECTED NOT DETECTED Final   Carbapenem resistance IMP NOT DETECTED NOT DETECTED Final   Carbapenem resistance KPC NOT DETECTED NOT DETECTED Final   Carbapenem resistance NDM NOT DETECTED NOT DETECTED Final   Carbapenem resist OXA 48 LIKE NOT  DETECTED NOT DETECTED Final   Carbapenem resistance VIM NOT DETECTED NOT DETECTED Final    Comment: Performed at [facility], [address] [place], [place] [address]  Culture, blood (Routine X 2) w Reflex to ID Panel     Status: Abnormal   Collection Time: 05/15/20 10:35 PM   Specimen: BLOOD LEFT ARM  Result Value Ref Range Status   Specimen Description BLOOD LEFT ARM  Final   Special Requests   Final    BOTTLES DRAWN AEROBIC AND ANAEROBIC Blood Culture adequate volume   Culture  Setup Time   Final    GRAM NEGATIVE RODS IN BOTH AEROBIC AND ANAEROBIC BOTTLES CRITICAL VALUE NOTED.  VALUE IS CONSISTENT WITH PREVIOUSLY REPORTED AND CALLED VALUE.    Culture (A)  Final    ENTEROBACTER AEROGENES SUSCEPTIBILITIES PERFORMED ON PREVIOUS CULTURE WITHIN THE LAST 5 DAYS. Performed at [facility], [place] [address] [place], [place] [address]    Report Status 05/18/2020 FINAL  Final         Radiology Studies: No results found.  Scheduled Meds: . sodium chloride   Intravenous Once  . amiodarone  200 mg Oral Daily  . apixaban  5 mg Oral BID  . Chlorhexidine Gluconate Cloth  6 each Topical Daily  . darbepoetin (ARANESP) injection - NON-DIALYSIS  150 mcg Subcutaneous Q Mon-1800  . feeding supplement (ENSURE ENLIVE)  237 mL Oral TID BM  . feeding supplement (PROSource TF)  90 mL Per Tube BID  . folic acid  1 mg Oral Daily  . insulin aspart  0-15 Units Subcutaneous TID WC  . insulin aspart  0-5 Units Subcutaneous QHS  . insulin aspart  4 Units Subcutaneous Q4H  . lidocaine  5 mL Intradermal Once  . mirtazapine  7.5 mg Oral QHS  . multivitamin with minerals  1 tablet Oral Daily  . nortriptyline  25 mg Oral QHS  . pantoprazole  40 mg Oral Daily  . predniSONE  5 mg Oral Q breakfast  . senna-docusate  1 tablet Oral Daily  . sodium bicarbonate  650 mg Oral BID  . sodium zirconium cyclosilicate  10 g Oral BID  . tacrolimus  1.5 mg Oral BID  . thiamine  100 mg Oral  Daily  . vitamin C  250 mg Oral BID  . zinc sulfate  220 mg Oral Daily   Continuous Infusions: . sodium chloride 500 mL (05/22/20 2230)  . meropenem (MERREM) IV 1 g (05/23/20 0904)     LOS: 22 days    Time spent:40 min    Marcelle Bebout, Geraldo Docker, MD [facility] Pager [phone]  If 7PM-7AM, please contact night-coverage [email or web address] Password [facility] 05/23/2020, 12:38 PM

## 2020-05-24 LAB — GLUCOSE, CAPILLARY
Glucose-Capillary: 108 mg/dL — ABNORMAL HIGH (ref 70–99)
Glucose-Capillary: 110 mg/dL — ABNORMAL HIGH (ref 70–99)
Glucose-Capillary: 119 mg/dL — ABNORMAL HIGH (ref 70–99)
Glucose-Capillary: 155 mg/dL — ABNORMAL HIGH (ref 70–99)
Glucose-Capillary: 156 mg/dL — ABNORMAL HIGH (ref 70–99)
Glucose-Capillary: 99 mg/dL (ref 70–99)

## 2020-05-24 LAB — RENAL FUNCTION PANEL
Albumin: 1.4 g/dL — ABNORMAL LOW (ref 3.5–5.0)
Anion gap: 14 (ref 5–15)
BUN: 98 mg/dL — ABNORMAL HIGH (ref 6–20)
CO2: 20 mmol/L — ABNORMAL LOW (ref 22–32)
Calcium: 8.5 mg/dL — ABNORMAL LOW (ref 8.9–10.3)
Chloride: 107 mmol/L (ref 98–111)
Creatinine, Ser: 2.49 mg/dL — ABNORMAL HIGH (ref 0.44–1.00)
GFR calc Af Amer: 24 mL/min — ABNORMAL LOW (ref >60)
GFR calc non Af Amer: 20 mL/min — ABNORMAL LOW (ref >60)
Glucose, Bld: 93 mg/dL (ref 70–99)
Phosphorus: 4.9 mg/dL — ABNORMAL HIGH (ref 2.5–4.6)
Potassium: 3.6 mmol/L (ref 3.5–5.1)
Sodium: 141 mmol/L (ref 135–145)

## 2020-05-24 LAB — CBC WITH DIFFERENTIAL/PLATELET
Abs Immature Granulocytes: 0.12 10*3/uL — ABNORMAL HIGH (ref 0.00–0.07)
Basophils Absolute: 0 10*3/uL (ref 0.0–0.1)
Basophils Relative: 1 %
Eosinophils Absolute: 0.1 10*3/uL (ref 0.0–0.5)
Eosinophils Relative: 2 %
HCT: 31.5 % — ABNORMAL LOW (ref 36.0–46.0)
Hemoglobin: 9.7 g/dL — ABNORMAL LOW (ref 12.0–15.0)
Immature Granulocytes: 2 %
Lymphocytes Relative: 33 %
Lymphs Abs: 2.1 10*3/uL (ref 0.7–4.0)
MCH: 29.7 pg (ref 26.0–34.0)
MCHC: 30.8 g/dL (ref 30.0–36.0)
MCV: 96.3 fL (ref 80.0–100.0)
Monocytes Absolute: 0.6 10*3/uL (ref 0.1–1.0)
Monocytes Relative: 9 %
Neutro Abs: 3.5 10*3/uL (ref 1.7–7.7)
Neutrophils Relative %: 53 %
Platelets: 563 10*3/uL — ABNORMAL HIGH (ref 150–400)
RBC: 3.27 MIL/uL — ABNORMAL LOW (ref 3.87–5.11)
RDW: 17.7 % — ABNORMAL HIGH (ref 11.5–15.5)
WBC: 6.4 10*3/uL (ref 4.0–10.5)
nRBC: 0 % (ref 0.0–0.2)

## 2020-05-24 LAB — MAGNESIUM: Magnesium: 2.6 mg/dL — ABNORMAL HIGH (ref 1.7–2.4)

## 2020-05-24 LAB — C-REACTIVE PROTEIN: CRP: 7.6 mg/dL — ABNORMAL HIGH (ref <1.0)

## 2020-05-24 MED ORDER — DRONABINOL 5 MG PO CAPS
5.0000 mg | ORAL_CAPSULE | Freq: Two times a day (BID) | ORAL | Status: DC
  Administered 2020-05-24 – 2020-06-01 (×16): 5 mg via ORAL
  Filled 2020-05-24 (×17): qty 2

## 2020-05-24 MED ORDER — SODIUM ZIRCONIUM CYCLOSILICATE 10 G PO PACK: 10.0000 g | PACK | Freq: Every day | ORAL | Status: DC | PRN

## 2020-05-24 NOTE — Progress Notes (Signed)
PROGRESS NOTE    Wanda [name]  ACZ:[id number] DOB: April 03, 1961 DOA: 05/01/2020 PCP: Lorene Dy, MD     Brief Narrative:  Wanda [name] is an 59 y.o. 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  X-ray did not show any evidence of osteomyelitis, she was started on empiric antibiotics and IV fluids, orthopedic surgery was consulted and she underwent a BKA.  Due to significant poor oral intake a core track was placed and she was started on tube feedings.  As per RN the patient has been eating 25 to 50% of her regular meals.  Palliative care was consulted and they met with family and they would like to continue to aggressively treat the treatable and if no improvement then they would think about hospice.  Hospital course was complicated by fever and Klebsiella bacteremia which is the source has remained unclear.  Chest x-ray does not show any infection.  MRI of the foot does not show osteomyelitis.  A-line was removed on 05/17/2020.  She was started empirically on IV cefepime.  ID was consulted who recommended to change her antibiotics to IV meropenem.  A TTE showed possible mitral valve vegetation, TEE was ordered but patient became hypotensive prior to procedure in the TEE was unable to be completed. ID recommended at least 6 weeks of empiric antibiotics.  Meanwhile general surgery was consulted for G-tube placement due to her significant poor oral intake and deconditioning, IR was unable to place PEG tube due to her gastric sleeve.  General surgery recommended to decrease her tube feedings and encourage oral intake.   Subjective: 8/22 A/O x4, negative S OB, negative CP, negative RIGHT BKA pain.   Assessment & Plan: Covid  vaccination; negative vaccination   Principal Problem:   Septic shock ([place]) Active Problems:   DM (diabetes mellitus), type 2 with renal complications ([place])   Essential hypertension   CHRONIC KIDNEY DISEASE STAGE IV ([place])   Renal transplant disorder   Depression   History of kidney transplant   Wound infection   Leukocytosis   Pressure injury of skin   Palliative care by specialist   Goals of care, counseling/discussion   DNR (do not resuscitate) discussion   Failure to thrive in adult   Pain of foot   Bacteremia due to Klebsiella pneumoniae   Septic shock ([place]): -Right foot ulcer with surrounding cellulitis she completed a course of IV Zosyn. -MRI did not show any evidence of osteomyelitis but it did show a superficial phlegmon on the posterior calcaneus. 8/5 s/p RIGHT BKA by Orthopedic surgery  -8/13 she started having fever and LEFT foot pain , and X-ray of the foot did not show any evidence of osteomyelitis. 8/13, blood cultures were positive for Klebsiella; started on IV cefepime which was later changed to IV meropenem,  -ID was consulted and the source of infection remains unclear. -An MRI of the foot without contrast did not show osteomyelitis but it did show superficial ulceration over the posterior calcaneus with heel pad inflammation no loculated fluid or collection.   Her CRP was 13 procalcitonin was 5 ID was consulted who recommended TTE that showed possible mitral vegetation,  -TEE was scheduled but she became hypotensive and was deemed unsafe to do so. ID recommended to continue empiric antibiotics for 6  weeks. Start date 05/16/2020.  Paroxysmal atrial fibrillation:  -Currently on amiodarone 200 and Eliquis.   -Her chads Vascor is greater than 3.  Hypotension: -Resolved.  Chronic kidney disease IV: (Baseline Cr~2.74) Lab Results  Component Value Date   CREATININE 2.49 (H) 05/24/2020   CREATININE 2.62 (H) 05/23/2020   CREATININE 2.49 (H) 05/22/2020    CREATININE 2.70 (H) 05/21/2020   CREATININE 2.75 (H) 05/20/2020  -Better than baseline  Hyperkalemia with peaked T waves: -Nephrology was consulted  -Resolved he was treated with Lokelma  Severe protein caloric malnutrition/cachexia: -Core track was placed and feeding tube was started.   -Unable to place a PEG tube due to her gastric sleeve. -Surgery was consulted recommended to decrease tube feedings and encourage oral intake. She ate about 25% of her food over the last 24 hours with discontinue tube feedings keep NG tube in place.   Severe protein calorie malnutrition -Continue to encourage patient to eat and drink. -Marinol 5 mg BID -Nutrition has been consulted continue tube feedings per core track.  DM type II controlled; DM neuropathy/DM neuropathy w 7/31 hemoglobin A1c = 4.1  Klebsiella Bacteremia: As stated above, echo showed vegetation TEE could not be performed as she became hypotensive ID recommended to continue meropenem for at least 6 weeks. -We will reconsult cardiology as patient's BP slightly elevated and see if they are willing to perform TEE -In the meantime continue current antibiotics last day 9/17  Pressure Injury 05/01/20 Heel Left Unstageable - Full thickness tissue loss in which the base of the injury is covered by slough (yellow, tan, gray, green or brown) and/or eschar (tan, brown or black) in the wound bed. dark discoloration unstageable bugg (Active)  05/01/20 2330  Location: Heel  Location Orientation: Left  Staging: Unstageable - Full thickness tissue loss in which the base of the injury is covered by slough (yellow, tan, gray, green or brown) and/or eschar (tan, brown or black) in the wound bed.  Wound Description (Comments): dark discoloration unstageable buggy  Present on Admission: Yes       DVT prophylaxis: Eliquis Code Status: Full Family Communication:  Status is: Inpatient  Stable  Dispo: The patient is from: Home               Anticipated d/c is to: Home              Anticipated d/c date is: Per surgery              Patient currently unstable      Consultants:  Palliative care [facility]   Procedures/Significant Events:    I have personally reviewed and interpreted all radiology studies and my findings are as above.  VENTILATOR SETTINGS:    Cultures   Antimicrobials: Anti-infectives (From admission, onward)   Start     Ordered Stop   05/18/20 1030  meropenem (MERREM) 1 g in sodium chloride 0.9 % 100 mL IVPB        05/18/20 1001     05/16/20 1400  ceFEPIme (MAXIPIME) 2 g in sodium chloride 0.9 % 100 mL IVPB  Status:  Discontinued        05/16/20 1351 05/18/20 1001   05/07/20 1400  vancomycin (VANCOCIN) IVPB 1000 mg/200 mL premix        05/07/20 0741 05/07/20 1550   05/05/20 1330  piperacillin-tazobactam (ZOSYN) IVPB 3.375 g  Status:  Discontinued        05/05/20 1251 05/12/20 1250   05/02/20 1600  vancomycin (  VANCOREADY) IVPB 750 mg/150 mL  Status:  Discontinued        05/01/20 1612 05/05/20 1251   05/02/20 1600  cefTRIAXone (ROCEPHIN) 2 g in sodium chloride 0.9 % 100 mL IVPB  Status:  Discontinued        05/01/20 1700 05/05/20 1250   05/01/20 1715  valGANciclovir (VALCYTE) 450 MG tablet TABS 450 mg  Status:  Discontinued        05/01/20 1700 05/01/20 1847   05/01/20 1500  cefTRIAXone (ROCEPHIN) 2 g in sodium chloride 0.9 % 100 mL IVPB        05/01/20 1452 05/01/20 1638   05/01/20 1500  vancomycin (VANCOREADY) IVPB 1500 mg/300 mL        05/01/20 1453 05/01/20 1755   05/01/20 1445  vancomycin (VANCOCIN) IVPB 1000 mg/200 mL premix  Status:  Discontinued        05/01/20 1444 05/01/20 1453      Devices    LINES / TUBES:      Continuous Infusions: . sodium chloride 500 mL (05/22/20 2230)  . meropenem (MERREM) IV 1 g (05/24/20 1119)     Objective: Vitals:   05/23/20 2000 05/24/20 0000 05/24/20 0400 05/24/20 1200  BP: 137/81 (!) 149/62 (!) 154/62 (!) 152/73  Pulse:      Resp: _0 Temp: 98.4 F (36.9 C) 98.4 F (36.9 C) 98.2 F (36.8 C)   TempSrc: Oral Oral Oral   SpO2: 99% 99% 95%   Weight:      Height:        Intake/Output Summary (Last 24 hours) at 05/24/2020 1844 Last data filed at 05/24/2020 0700 Gross per 24 hour  Intake 100 ml  Output 1000 ml  Net -900 ml   Filed Weights   05/18/20 0404 05/19/20 0524 05/22/20 0423  Weight: 70.3 kg 71.4 kg 69.9 kg    Examination:  General: A/O x4, No acute respiratory distress Eyes: negative scleral hemorrhage, negative anisocoria, negative icterus ENT: Negative Runny nose, negative gingival bleeding, Neck:  Negative scars, masses, torticollis, lymphadenopathy, JVD Lungs: Clear to auscultation bilaterally without wheezes or crackles Cardiovascular: Regular rate and rhythm without murmur gallop or rub normal S1 and S2 Abdomen: negative abdominal pain, nondistended, positive soft, bowel sounds, no rebound, no ascites, no appreciable mass Extremities: RIGHT BKA dressed with Ace wrap.  No sign of discharge did not take dressing down. Skin: Negative rashes, lesions, ulcers Psychiatric:  Negative depression, negative anxiety, negative fatigue, negative mania  Central nervous system:  Cranial nerves II through XII intact, tongue/uvula midline, all extremities muscle strength 5/5, sensation intact throughout, negative dysarthria, negative expressive aphasia, negative receptive aphasia.  .     Data Reviewed: Care during the described time interval was provided by me .  I have reviewed this patient's available data, including medical history, events of note, physical examination, and all test results as part of my evaluation.  CBC: Recent Labs  Lab 05/20/20 0607 05/21/20 0245 05/22/20 0319 05/23/20 0147 05/24/20 0157  WBC 5.6 5.1 5.8 6.3 6.4  NEUTROABS 3.6 3.2 3.3 3.7 3.5  HGB 9.5* 9.4* 10.0* 10.7* 9.7*  HCT 30.2* 30.7* 32.7* 34.1* 31.5*  MCV 95.3 97.8 96.7 96.3 96.3  PLT 374 397 482* 545* 563*    Basic Metabolic Panel: Recent Labs  Lab 05/20/20 0607 05/21/20 0245 05/22/20 0319 05/23/20 0147 05/24/20 0157  NA 137 136 137 139 141  K 4.0 4.1 4.1 3.8 3.6  CL 104 105 107 107 107  CO2 22 20* 20* 20* 20*  GLUCOSE 137* 154* 116* 122* 93  BUN 97* 99* 100* 96* 98*  CREATININE 2.75* 2.70* 2.49* 2.62* 2.49*  CALCIUM 8.6* 8.6* 8.5* 8.7* 8.5*  MG 2.3 2.4 2.6* 2.5* 2.6*  PHOS 3.6 3.8 4.0 4.4 4.9*   GFR: Estimated Creatinine Clearance: 25.4 mL/min (A) (by C-G formula based on SCr of 2.49 mg/dL (H)). Liver Function Tests: Recent Labs  Lab 05/20/20 0607 05/21/20 0245 05/22/20 0319 05/23/20 0147 05/24/20 0157  ALBUMIN 1.2* 1.3* 1.3* 1.3* 1.4*   No results for input(s): LIPASE, AMYLASE in the last 168 hours. No results for input(s): AMMONIA in the last 168 hours. Coagulation Profile: No results for input(s): INR, PROTIME in the last 168 hours. Cardiac Enzymes: No results for input(s): CKTOTAL, CKMB, CKMBINDEX, TROPONINI in the last 168 hours. BNP (last 3 results) No results for input(s): PROBNP in the last 8760 hours. HbA1C: No results for input(s): HGBA1C in the last 72 hours. CBG: Recent Labs  Lab 05/24/20 0012 05/24/20 0426 05/24/20 0834 05/24/20 1301 05/24/20 1643  GLUCAP 110* 108* 99 119* 155*   Lipid Profile: No results for input(s): CHOL, HDL, LDLCALC, TRIG, CHOLHDL, LDLDIRECT in the last 72 hours. Thyroid Function Tests: No results for input(s): TSH, T4TOTAL, FREET4, T3FREE, THYROIDAB in the last 72 hours. Anemia Panel: No results for input(s): VITAMINB12, FOLATE, FERRITIN, TIBC, IRON, RETICCTPCT in the last 72 hours. Sepsis Labs: Recent Labs  Lab 05/18/20 0136  PROCALCITON 5.18    Recent Results (from the past 240 hour(s))  Culture, blood (Routine X 2) w Reflex to ID Panel     Status: Abnormal   Collection Time: 05/15/20 10:21 PM   Specimen: BLOOD LEFT HAND  Result Value Ref Range Status   Specimen Description BLOOD LEFT HAND  Final   Special  Requests   Final    BOTTLES DRAWN AEROBIC AND ANAEROBIC Blood Culture adequate volume   Culture  Setup Time   Final    GRAM NEGATIVE RODS IN BOTH AEROBIC AND ANAEROBIC BOTTLES CRITICAL RESULT CALLED TO, READ BACK BY AND VERIFIED WITH: C. KO PHARMD, AT 1333 05/16/20 BY D. VANHOOK Performed at [facility], [place] [address] [place], [place] [address]    Culture ENTEROBACTER AEROGENES (A)  Final   Report Status 05/18/2020 FINAL  Final   Organism ID, Bacteria ENTEROBACTER AEROGENES  Final      Susceptibility   Enterobacter aerogenes - MIC*    CEFAZOLIN >=64 RESISTANT Resistant     CEFEPIME 1 SENSITIVE Sensitive     CEFTAZIDIME >=64 RESISTANT Resistant     CEFTRIAXONE >=64 RESISTANT Resistant     CIPROFLOXACIN <=0.25 SENSITIVE Sensitive     GENTAMICIN <=1 SENSITIVE Sensitive     IMIPENEM 0.5 SENSITIVE Sensitive     TRIMETH/SULFA <=20 SENSITIVE Sensitive     PIP/TAZO >=128 RESISTANT Resistant     * ENTEROBACTER AEROGENES  Blood Culture ID Panel (Reflexed)     Status: Abnormal   Collection Time: 05/15/20 10:21 PM  Result Value Ref Range Status   Enterococcus faecalis NOT DETECTED NOT DETECTED Final   Enterococcus Faecium NOT DETECTED NOT DETECTED Final   Listeria monocytogenes NOT DETECTED NOT DETECTED Final   Staphylococcus species NOT DETECTED NOT DETECTED Final   Staphylococcus aureus (BCID) NOT DETECTED NOT DETECTED Final   Staphylococcus epidermidis NOT DETECTED NOT DETECTED Final   Staphylococcus lugdunensis NOT DETECTED NOT DETECTED Final   Streptococcus species NOT DETECTED NOT DETECTED Final   Streptococcus agalactiae NOT  DETECTED NOT DETECTED Final   Streptococcus pneumoniae NOT DETECTED NOT DETECTED Final   Streptococcus pyogenes NOT DETECTED NOT DETECTED Final   A.calcoaceticus-baumannii NOT DETECTED NOT DETECTED Final   Bacteroides fragilis NOT DETECTED NOT DETECTED Final   Enterobacterales DETECTED (A) NOT DETECTED Final    Comment: Enterobacterales represent a  large order of gram negative bacteria, not a single organism. CRITICAL RESULT CALLED TO, READ BACK BY AND VERIFIED WITH: C. KO PHARMD, AT 1333 05/16/20 BY D. VANHOOK    Enterobacter cloacae complex NOT DETECTED NOT DETECTED Final   Escherichia coli NOT DETECTED NOT DETECTED Final   Klebsiella aerogenes DETECTED (A) NOT DETECTED Final    Comment: CRITICAL RESULT CALLED TO, READ BACK BY AND VERIFIED WITH: C. KO PHARMD, AT 1333 05/16/20 BY D. VANHOOK    Klebsiella oxytoca NOT DETECTED NOT DETECTED Final   Klebsiella pneumoniae NOT DETECTED NOT DETECTED Final   Proteus species NOT DETECTED NOT DETECTED Final   Salmonella species NOT DETECTED NOT DETECTED Final   Serratia marcescens NOT DETECTED NOT DETECTED Final   Haemophilus influenzae NOT DETECTED NOT DETECTED Final   Neisseria meningitidis NOT DETECTED NOT DETECTED Final   Pseudomonas aeruginosa NOT DETECTED NOT DETECTED Final   Stenotrophomonas maltophilia NOT DETECTED NOT DETECTED Final   Candida albicans NOT DETECTED NOT DETECTED Final   Candida auris NOT DETECTED NOT DETECTED Final   Candida glabrata NOT DETECTED NOT DETECTED Final   Candida krusei NOT DETECTED NOT DETECTED Final   Candida parapsilosis NOT DETECTED NOT DETECTED Final   Candida tropicalis NOT DETECTED NOT DETECTED Final   Cryptococcus neoformans/gattii NOT DETECTED NOT DETECTED Final   CTX-M ESBL NOT DETECTED NOT DETECTED Final   Carbapenem resistance IMP NOT DETECTED NOT DETECTED Final   Carbapenem resistance KPC NOT DETECTED NOT DETECTED Final   Carbapenem resistance NDM NOT DETECTED NOT DETECTED Final   Carbapenem resist OXA 48 LIKE NOT DETECTED NOT DETECTED Final   Carbapenem resistance VIM NOT DETECTED NOT DETECTED Final    Comment: Performed at [facility], [address] [place], [place] [address]  Culture, blood (Routine X 2) w Reflex to ID Panel     Status: Abnormal   Collection Time: 05/15/20 10:35 PM   Specimen: BLOOD LEFT ARM  Result Value Ref  Range Status   Specimen Description BLOOD LEFT ARM  Final   Special Requests   Final    BOTTLES DRAWN AEROBIC AND ANAEROBIC Blood Culture adequate volume   Culture  Setup Time   Final    GRAM NEGATIVE RODS IN BOTH AEROBIC AND ANAEROBIC BOTTLES CRITICAL VALUE NOTED.  VALUE IS CONSISTENT WITH PREVIOUSLY REPORTED AND CALLED VALUE.    Culture (A)  Final    ENTEROBACTER AEROGENES SUSCEPTIBILITIES PERFORMED ON PREVIOUS CULTURE WITHIN THE LAST 5 DAYS. Performed at [facility], [place] [address], [place], [place] [address]    Report Status 05/18/2020 FINAL  Final         Radiology Studies: No results found.      Scheduled Meds: . sodium chloride   Intravenous Once  . amiodarone  200 mg Oral Daily  . apixaban  5 mg Oral BID  . Chlorhexidine Gluconate Cloth  6 each Topical Daily  . darbepoetin (ARANESP) injection - NON-DIALYSIS  150 mcg Subcutaneous Q Mon-1800  . dronabinol  5 mg Oral BID AC  . feeding supplement (ENSURE ENLIVE)  237 mL Oral TID BM  . feeding supplement (PROSource TF)  90 mL Per Tube  BID  . folic acid  1 mg Oral Daily  . insulin aspart  0-15 Units Subcutaneous TID WC  . insulin aspart  0-5 Units Subcutaneous QHS  . insulin aspart  4 Units Subcutaneous Q4H  . lidocaine  5 mL Intradermal Once  . mirtazapine  7.5 mg Oral QHS  . multivitamin with minerals  1 tablet Oral Daily  . nortriptyline  25 mg Oral QHS  . pantoprazole  40 mg Oral Daily  . predniSONE  5 mg Oral Q breakfast  . senna-docusate  1 tablet Oral Daily  . sodium bicarbonate  650 mg Oral BID  . tacrolimus  1.5 mg Oral BID  . thiamine  100 mg Oral Daily  . vitamin C  250 mg Oral BID  . zinc sulfate  220 mg Oral Daily   Continuous Infusions: . sodium chloride 500 mL (05/22/20 2230)  . meropenem (MERREM) IV 1 g (05/24/20 1119)     LOS: 23 days    Time spent:40 min    Bailey Kolbe, Geraldo Docker, MD [facility] Pager [phone]  If 7PM-7AM, please contact  night-coverage [email or web address] Password [facility] 05/24/2020, 6:44 PM

## 2020-05-25 LAB — CBC WITH DIFFERENTIAL/PLATELET
Abs Immature Granulocytes: 0.14 10*3/uL — ABNORMAL HIGH (ref 0.00–0.07)
Basophils Absolute: 0.1 10*3/uL (ref 0.0–0.1)
Basophils Relative: 1 %
Eosinophils Absolute: 0.1 10*3/uL (ref 0.0–0.5)
Eosinophils Relative: 2 %
HCT: 34.3 % — ABNORMAL LOW (ref 36.0–46.0)
Hemoglobin: 10.7 g/dL — ABNORMAL LOW (ref 12.0–15.0)
Immature Granulocytes: 2 %
Lymphocytes Relative: 28 %
Lymphs Abs: 1.8 10*3/uL (ref 0.7–4.0)
MCH: 30.4 pg (ref 26.0–34.0)
MCHC: 31.2 g/dL (ref 30.0–36.0)
MCV: 97.4 fL (ref 80.0–100.0)
Monocytes Absolute: 0.6 10*3/uL (ref 0.1–1.0)
Monocytes Relative: 9 %
Neutro Abs: 3.8 10*3/uL (ref 1.7–7.7)
Neutrophils Relative %: 58 %
Platelets: 609 10*3/uL — ABNORMAL HIGH (ref 150–400)
RBC: 3.52 MIL/uL — ABNORMAL LOW (ref 3.87–5.11)
RDW: 17.7 % — ABNORMAL HIGH (ref 11.5–15.5)
WBC: 6.5 10*3/uL (ref 4.0–10.5)
nRBC: 0.3 % — ABNORMAL HIGH (ref 0.0–0.2)

## 2020-05-25 LAB — RENAL FUNCTION PANEL
Albumin: 1.5 g/dL — ABNORMAL LOW (ref 3.5–5.0)
Anion gap: 13 (ref 5–15)
BUN: 99 mg/dL — ABNORMAL HIGH (ref 6–20)
CO2: 20 mmol/L — ABNORMAL LOW (ref 22–32)
Calcium: 8.4 mg/dL — ABNORMAL LOW (ref 8.9–10.3)
Chloride: 108 mmol/L (ref 98–111)
Creatinine, Ser: 2.58 mg/dL — ABNORMAL HIGH (ref 0.44–1.00)
GFR calc Af Amer: 23 mL/min — ABNORMAL LOW (ref >60)
GFR calc non Af Amer: 20 mL/min — ABNORMAL LOW (ref >60)
Glucose, Bld: 114 mg/dL — ABNORMAL HIGH (ref 70–99)
Phosphorus: 5.4 mg/dL — ABNORMAL HIGH (ref 2.5–4.6)
Potassium: 3.3 mmol/L — ABNORMAL LOW (ref 3.5–5.1)
Sodium: 141 mmol/L (ref 135–145)

## 2020-05-25 LAB — GLUCOSE, CAPILLARY
Glucose-Capillary: 116 mg/dL — ABNORMAL HIGH (ref 70–99)
Glucose-Capillary: 74 mg/dL (ref 70–99)
Glucose-Capillary: 109 mg/dL — ABNORMAL HIGH (ref 70–99)
Glucose-Capillary: 141 mg/dL — ABNORMAL HIGH (ref 70–99)
Glucose-Capillary: 102 mg/dL — ABNORMAL HIGH (ref 70–99)
Glucose-Capillary: 146 mg/dL — ABNORMAL HIGH (ref 70–99)

## 2020-05-25 LAB — C-REACTIVE PROTEIN: CRP: 7.3 mg/dL — ABNORMAL HIGH (ref <1.0)

## 2020-05-25 LAB — MAGNESIUM: Magnesium: 2.7 mg/dL — ABNORMAL HIGH (ref 1.7–2.4)

## 2020-05-25 NOTE — Progress Notes (Signed)
PROGRESS NOTE    STEFFANIE MINGLE  ZOX:[id number] DOB: 04-26-1961 DOA: 05/01/2020 PCP: Lorene Dy, MD     Brief Narrative:  Wanda [name] is an 59 y.o. 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  X-ray did not show any evidence of osteomyelitis, she was started on empiric antibiotics and IV fluids, orthopedic surgery was consulted and she underwent a BKA.  Due to significant poor oral intake a core track was placed and she was started on tube feedings.  As per RN the patient has been eating 25 to 50% of her regular meals.  Palliative care was consulted and they met with family and they would like to continue to aggressively treat the treatable and if no improvement then they would think about hospice.  Hospital course was complicated by fever and Klebsiella bacteremia which is the source has remained unclear.  Chest x-ray does not show any infection.  MRI of the foot does not show osteomyelitis.  A-line was removed on 05/17/2020.  She was started empirically on IV cefepime.  ID was consulted who recommended to change her antibiotics to IV meropenem.  A TTE showed possible mitral valve vegetation, TEE was ordered but patient became hypotensive prior to procedure in the TEE was unable to be completed. ID recommended at least 6 weeks of empiric antibiotics.  Meanwhile general surgery was consulted for G-tube placement due to her significant poor oral intake and deconditioning, IR was unable to place PEG tube due to her gastric sleeve.  General surgery recommended to decrease her tube feedings and encourage oral intake.   Subjective: 8/23 A/O x4, negative S OB, negative CP, negative RIGHT BKA pain.  Patient says she sat in the chair  for short period of time.   Assessment & Plan: Covid vaccination; negative vaccination   Principal Problem:   Septic shock ([place]) Active Problems:   DM (diabetes mellitus), type 2 with renal complications ([place])   Essential hypertension   CHRONIC KIDNEY DISEASE STAGE IV ([place])   Renal transplant disorder   Depression   History of kidney transplant   Wound infection   Leukocytosis   Pressure injury of skin   Palliative care by specialist   Goals of care, counseling/discussion   DNR (do not resuscitate) discussion   Failure to thrive in adult   Pain of foot   Bacteremia due to Klebsiella pneumoniae   Septic shock ([place]): -Right foot ulcer with surrounding cellulitis she completed a course of IV Zosyn. -MRI did not show any evidence of osteomyelitis but it did show a superficial phlegmon on the posterior calcaneus. 8/5 s/p RIGHT BKA by Orthopedic surgery  -8/13 she started having fever and LEFT foot pain , and X-ray of the foot did not show any evidence of osteomyelitis. 8/13, blood cultures were positive for Klebsiella; started on IV cefepime which was later changed to IV meropenem,  -ID was consulted and the source of infection remains unclear. -An MRI of the foot without contrast did not show osteomyelitis but it did show superficial ulceration over the posterior calcaneus with heel pad inflammation no loculated fluid or collection.   Her CRP was 13 procalcitonin was 5 ID was consulted who recommended TTE that showed possible mitral vegetation,  -TEE was scheduled but she became hypotensive and was  deemed unsafe to do so. ID recommended to continue empiric antibiotics for 6 weeks. Start date 05/16/2020.  Paroxysmal atrial fibrillation:  -Currently on amiodarone 200 and Eliquis.   -Her chads Vascor is greater than 3.  Hypotension: -Resolved.  Chronic kidney disease IV: (Baseline Cr~2.74) Lab Results  Component Value Date   CREATININE 2.58 (H) 05/25/2020   CREATININE  2.49 (H) 05/24/2020   CREATININE 2.62 (H) 05/23/2020   CREATININE 2.49 (H) 05/22/2020   CREATININE 2.70 (H) 05/21/2020  -Better than baseline  Hyperkalemia with peaked T waves: -Nephrology was consulted  -Resolved was treated with Lokelma  Severe protein caloric malnutrition/cachexia: -Core track was placed and feeding tube was started.   -Unable to place a PEG tube due to her gastric sleeve. -Surgery was consulted recommended to decrease tube feedings and encourage oral intake. She ate about 25% of her food over the last 24 hours with discontinue tube feedings keep NG tube in place.  Severe protein calorie malnutrition -Continue to encourage patient to eat and drink. -Marinol 5 mg BID -Nutrition has been consulted continue tube feedings per core track.  DM type II controlled; DM neuropathy/DM neuropathy w 7/31 hemoglobin A1c = 4.1  Klebsiella Bacteremia: As stated above, echo showed vegetation TEE could not be performed as she became hypotensive ID recommended to continue meropenem for at least 6 weeks. -We will reconsult cardiology as patient's BP slightly elevated and see if they are willing to perform TEE -In the meantime continue current antibiotics last day 9/17  Pressure Injury 05/01/20 Heel Left Unstageable - Full thickness tissue loss in which the base of the injury is covered by slough (yellow, tan, gray, green or brown) and/or eschar (tan, brown or black) in the wound bed. dark discoloration unstageable bugg (Active)  05/01/20 2330  Location: Heel  Location Orientation: Left  Staging: Unstageable - Full thickness tissue loss in which the base of the injury is covered by slough (yellow, tan, gray, green or brown) and/or eschar (tan, brown or black) in the wound bed.  Wound Description (Comments): dark discoloration unstageable buggy  Present on Admission: Yes   Goals of care  -8/23 out of bed to chair q shift for at least 2 to 3 hours.  All lights on and shades  open during the daylight hours.    DVT prophylaxis: Eliquis Code Status: Full Family Communication:  Status is: Inpatient  Stable  Dispo: The patient is from: Home              Anticipated d/c is to: Home              Anticipated d/c date is: Per surgery              Patient currently unstable      Consultants:  Palliative care [facility]   Procedures/Significant Events:    I have personally reviewed and interpreted all radiology studies and my findings are as above.  VENTILATOR SETTINGS:    Cultures   Antimicrobials: Anti-infectives (From admission, onward)   Start     Ordered Stop   05/18/20 1030  meropenem (MERREM) 1 g in sodium chloride 0.9 % 100 mL IVPB        05/18/20 1001     05/16/20 1400  ceFEPIme (MAXIPIME) 2 g in sodium chloride 0.9 % 100 mL IVPB  Status:  Discontinued        05/16/20 1351 05/18/20 1001   05/07/20 1400  vancomycin (VANCOCIN) IVPB 1000 mg/200 mL premix  05/07/20 0741 05/07/20 1550   05/05/20 1330  piperacillin-tazobactam (ZOSYN) IVPB 3.375 g  Status:  Discontinued        05/05/20 1251 05/12/20 1250   05/02/20 1600  vancomycin (VANCOREADY) IVPB 750 mg/150 mL  Status:  Discontinued        05/01/20 1612 05/05/20 1251   05/02/20 1600  cefTRIAXone (ROCEPHIN) 2 g in sodium chloride 0.9 % 100 mL IVPB  Status:  Discontinued        05/01/20 1700 05/05/20 1250   05/01/20 1715  valGANciclovir (VALCYTE) 450 MG tablet TABS 450 mg  Status:  Discontinued        05/01/20 1700 05/01/20 1847   05/01/20 1500  cefTRIAXone (ROCEPHIN) 2 g in sodium chloride 0.9 % 100 mL IVPB        05/01/20 1452 05/01/20 1638   05/01/20 1500  vancomycin (VANCOREADY) IVPB 1500 mg/300 mL        05/01/20 1453 05/01/20 1755   05/01/20 1445  vancomycin (VANCOCIN) IVPB 1000 mg/200 mL premix  Status:  Discontinued        05/01/20 1444 05/01/20 1453      Devices    LINES / TUBES:      Continuous Infusions: . sodium chloride 500 mL (05/22/20 2230)  . meropenem  (MERREM) IV 1 g (05/25/20 0940)     Objective: Vitals:   05/24/20 2355 05/25/20 0445 05/25/20 0745 05/25/20 1110  BP: (!) 149/50 (!) 146/85 (!) 141/70 (!) 134/53  Pulse: 100 100 80 98  Resp: '17 16 14 18  ' Temp: 98.3 F (36.8 C) 98 F (36.7 C) 98 F (36.7 C) 98.1 F (36.7 C)  TempSrc: Oral Oral Oral Oral  SpO2: 96% 97% 100% 100%  Weight:      Height:       No intake or output data in the 24 hours ending 05/25/20 1126 Filed Weights   05/18/20 0404 05/19/20 0524 05/22/20 0423  Weight: 70.3 kg 71.4 kg 69.9 kg    Examination:  General: A/O x4, No acute respiratory distress Eyes: negative scleral hemorrhage, negative anisocoria, negative icterus ENT: Negative Runny nose, negative gingival bleeding, Neck:  Negative scars, masses, torticollis, lymphadenopathy, JVD Lungs: Clear to auscultation bilaterally without wheezes or crackles Cardiovascular: Regular rate and rhythm without murmur gallop or rub normal S1 and S2 Abdomen: negative abdominal pain, nondistended, positive soft, bowel sounds, no rebound, no ascites, no appreciable mass Extremities: RIGHT BKA dressed with Ace wrap.  No sign of discharge did not take dressing down. Skin: Negative rashes, lesions, ulcers Psychiatric:  Negative depression, negative anxiety, negative fatigue, negative mania  Central nervous system:  Cranial nerves II through XII intact, tongue/uvula midline, all extremities muscle strength 5/5, sensation intact throughout, negative dysarthria, negative expressive aphasia, negative receptive aphasia.  .     Data Reviewed: Care during the described time interval was provided by me .  I have reviewed this patient's available data, including medical history, events of note, physical examination, and all test results as part of my evaluation.  CBC: Recent Labs  Lab 05/21/20 0245 05/22/20 0319 05/23/20 0147 05/24/20 0157 05/25/20 0649  WBC 5.1 5.8 6.3 6.4 6.5  NEUTROABS 3.2 3.3 3.7 3.5 3.8  HGB  9.4* 10.0* 10.7* 9.7* 10.7*  HCT 30.7* 32.7* 34.1* 31.5* 34.3*  MCV 97.8 96.7 96.3 96.3 97.4  PLT 397 482* 545* 563* 893*   Basic Metabolic Panel: Recent Labs  Lab 05/21/20 0245 05/22/20 0319 05/23/20 0147 05/24/20 0157 05/25/20 0649  NA 136  137 139 141 141  K 4.1 4.1 3.8 3.6 3.3*  CL 105 107 107 107 108  CO2 20* 20* 20* 20* 20*  GLUCOSE 154* 116* 122* 93 114*  BUN 99* 100* 96* 98* 99*  CREATININE 2.70* 2.49* 2.62* 2.49* 2.58*  CALCIUM 8.6* 8.5* 8.7* 8.5* 8.4*  MG 2.4 2.6* 2.5* 2.6* 2.7*  PHOS 3.8 4.0 4.4 4.9* 5.4*   GFR: Estimated Creatinine Clearance: 24.5 mL/min (A) (by C-G formula based on SCr of 2.58 mg/dL (H)). Liver Function Tests: Recent Labs  Lab 05/21/20 0245 05/22/20 0319 05/23/20 0147 05/24/20 0157 05/25/20 0649  ALBUMIN 1.3* 1.3* 1.3* 1.4* 1.5*   No results for input(s): LIPASE, AMYLASE in the last 168 hours. No results for input(s): AMMONIA in the last 168 hours. Coagulation Profile: No results for input(s): INR, PROTIME in the last 168 hours. Cardiac Enzymes: No results for input(s): CKTOTAL, CKMB, CKMBINDEX, TROPONINI in the last 168 hours. BNP (last 3 results) No results for input(s): PROBNP in the last 8760 hours. HbA1C: No results for input(s): HGBA1C in the last 72 hours. CBG: Recent Labs  Lab 05/24/20 1643 05/24/20 2023 05/24/20 2351 05/25/20 0443 05/25/20 0743  GLUCAP 155* 156* 74 116* 109*   Lipid Profile: No results for input(s): CHOL, HDL, LDLCALC, TRIG, CHOLHDL, LDLDIRECT in the last 72 hours. Thyroid Function Tests: No results for input(s): TSH, T4TOTAL, FREET4, T3FREE, THYROIDAB in the last 72 hours. Anemia Panel: No results for input(s): VITAMINB12, FOLATE, FERRITIN, TIBC, IRON, RETICCTPCT in the last 72 hours. Sepsis Labs: No results for input(s): PROCALCITON, LATICACIDVEN in the last 168 hours.  Recent Results (from the past 240 hour(s))  Culture, blood (Routine X 2) w Reflex to ID Panel     Status: Abnormal    Collection Time: 05/15/20 10:21 PM   Specimen: BLOOD LEFT HAND  Result Value Ref Range Status   Specimen Description BLOOD LEFT HAND  Final   Special Requests   Final    BOTTLES DRAWN AEROBIC AND ANAEROBIC Blood Culture adequate volume   Culture  Setup Time   Final    GRAM NEGATIVE RODS IN BOTH AEROBIC AND ANAEROBIC BOTTLES CRITICAL RESULT CALLED TO, READ BACK BY AND VERIFIED WITH: C. KO PHARMD, AT 1333 05/16/20 BY D. VANHOOK Performed at [facility], [place] [address], [place], [place] [address]    Culture ENTEROBACTER AEROGENES (A)  Final   Report Status 05/18/2020 FINAL  Final   Organism ID, Bacteria ENTEROBACTER AEROGENES  Final      Susceptibility   Enterobacter aerogenes - MIC*    CEFAZOLIN >=64 RESISTANT Resistant     CEFEPIME 1 SENSITIVE Sensitive     CEFTAZIDIME >=64 RESISTANT Resistant     CEFTRIAXONE >=64 RESISTANT Resistant     CIPROFLOXACIN <=0.25 SENSITIVE Sensitive     GENTAMICIN <=1 SENSITIVE Sensitive     IMIPENEM 0.5 SENSITIVE Sensitive     TRIMETH/SULFA <=20 SENSITIVE Sensitive     PIP/TAZO >=128 RESISTANT Resistant     * ENTEROBACTER AEROGENES  Blood Culture ID Panel (Reflexed)     Status: Abnormal   Collection Time: 05/15/20 10:21 PM  Result Value Ref Range Status   Enterococcus faecalis NOT DETECTED NOT DETECTED Final   Enterococcus Faecium NOT DETECTED NOT DETECTED Final   Listeria monocytogenes NOT DETECTED NOT DETECTED Final   Staphylococcus species NOT DETECTED NOT DETECTED Final   Staphylococcus aureus (BCID) NOT DETECTED NOT DETECTED Final   Staphylococcus epidermidis NOT DETECTED NOT DETECTED Final   Staphylococcus lugdunensis  NOT DETECTED NOT DETECTED Final   Streptococcus species NOT DETECTED NOT DETECTED Final   Streptococcus agalactiae NOT DETECTED NOT DETECTED Final   Streptococcus pneumoniae NOT DETECTED NOT DETECTED Final   Streptococcus pyogenes NOT DETECTED NOT DETECTED Final   A.calcoaceticus-baumannii NOT DETECTED NOT DETECTED Final     Bacteroides fragilis NOT DETECTED NOT DETECTED Final   Enterobacterales DETECTED (A) NOT DETECTED Final    Comment: Enterobacterales represent a large order of gram negative bacteria, not a single organism. CRITICAL RESULT CALLED TO, READ BACK BY AND VERIFIED WITH: C. KO PHARMD, AT 1333 05/16/20 BY D. VANHOOK    Enterobacter cloacae complex NOT DETECTED NOT DETECTED Final   Escherichia coli NOT DETECTED NOT DETECTED Final   Klebsiella aerogenes DETECTED (A) NOT DETECTED Final    Comment: CRITICAL RESULT CALLED TO, READ BACK BY AND VERIFIED WITH: C. KO PHARMD, AT 1333 05/16/20 BY D. VANHOOK    Klebsiella oxytoca NOT DETECTED NOT DETECTED Final   Klebsiella pneumoniae NOT DETECTED NOT DETECTED Final   Proteus species NOT DETECTED NOT DETECTED Final   Salmonella species NOT DETECTED NOT DETECTED Final   Serratia marcescens NOT DETECTED NOT DETECTED Final   Haemophilus influenzae NOT DETECTED NOT DETECTED Final   Neisseria meningitidis NOT DETECTED NOT DETECTED Final   Pseudomonas aeruginosa NOT DETECTED NOT DETECTED Final   Stenotrophomonas maltophilia NOT DETECTED NOT DETECTED Final   Candida albicans NOT DETECTED NOT DETECTED Final   Candida auris NOT DETECTED NOT DETECTED Final   Candida glabrata NOT DETECTED NOT DETECTED Final   Candida krusei NOT DETECTED NOT DETECTED Final   Candida parapsilosis NOT DETECTED NOT DETECTED Final   Candida tropicalis NOT DETECTED NOT DETECTED Final   Cryptococcus neoformans/gattii NOT DETECTED NOT DETECTED Final   CTX-M ESBL NOT DETECTED NOT DETECTED Final   Carbapenem resistance IMP NOT DETECTED NOT DETECTED Final   Carbapenem resistance KPC NOT DETECTED NOT DETECTED Final   Carbapenem resistance NDM NOT DETECTED NOT DETECTED Final   Carbapenem resist OXA 48 LIKE NOT DETECTED NOT DETECTED Final   Carbapenem resistance VIM NOT DETECTED NOT DETECTED Final    Comment: Performed at [facility], [address], [place], [place] [address]   Culture, blood (Routine X 2) w Reflex to ID Panel     Status: Abnormal   Collection Time: 05/15/20 10:35 PM   Specimen: BLOOD LEFT ARM  Result Value Ref Range Status   Specimen Description BLOOD LEFT ARM  Final   Special Requests   Final    BOTTLES DRAWN AEROBIC AND ANAEROBIC Blood Culture adequate volume   Culture  Setup Time   Final    GRAM NEGATIVE RODS IN BOTH AEROBIC AND ANAEROBIC BOTTLES CRITICAL VALUE NOTED.  VALUE IS CONSISTENT WITH PREVIOUSLY REPORTED AND CALLED VALUE.    Culture (A)  Final    ENTEROBACTER AEROGENES SUSCEPTIBILITIES PERFORMED ON PREVIOUS CULTURE WITHIN THE LAST 5 DAYS. Performed at [facility], [place] [address] [place], [place] [address]    Report Status 05/18/2020 FINAL  Final         Radiology Studies: No results found.      Scheduled Meds: . sodium chloride   Intravenous Once  . amiodarone  200 mg Oral Daily  . apixaban  5 mg Oral BID  . Chlorhexidine Gluconate Cloth  6 each Topical Daily  . darbepoetin (ARANESP) injection - NON-DIALYSIS  150 mcg Subcutaneous Q Mon-1800  . dronabinol  5 mg Oral BID AC  . feeding  supplement (ENSURE ENLIVE)  237 mL Oral TID BM  . feeding supplement (PROSource TF)  90 mL Per Tube BID  . folic acid  1 mg Oral Daily  . insulin aspart  0-15 Units Subcutaneous TID WC  . insulin aspart  0-5 Units Subcutaneous QHS  . insulin aspart  4 Units Subcutaneous Q4H  . lidocaine  5 mL Intradermal Once  . mirtazapine  7.5 mg Oral QHS  . multivitamin with minerals  1 tablet Oral Daily  . nortriptyline  25 mg Oral QHS  . pantoprazole  40 mg Oral Daily  . predniSONE  5 mg Oral Q breakfast  . senna-docusate  1 tablet Oral Daily  . sodium bicarbonate  650 mg Oral BID  . tacrolimus  1.5 mg Oral BID  . thiamine  100 mg Oral Daily  . vitamin C  250 mg Oral BID  . zinc sulfate  220 mg Oral Daily   Continuous Infusions: . sodium chloride 500 mL (05/22/20 2230)  . meropenem (MERREM) IV 1 g (05/25/20 0940)     LOS:  24 days    Time spent:40 min    Myrtis Maille, Geraldo Docker, MD [facility] Pager [phone]  If 7PM-7AM, please contact night-coverage [email or web address] Password [facility] 05/25/2020, 11:26 AM

## 2020-05-26 ENCOUNTER — Encounter (HOSPITAL_BASED_OUTPATIENT_CLINIC_OR_DEPARTMENT_OTHER): Payer: Medicare HMO | Admitting: Internal Medicine

## 2020-05-26 LAB — RENAL FUNCTION PANEL
Albumin: 1.5 g/dL — ABNORMAL LOW (ref 3.5–5.0)
Anion gap: 12 (ref 5–15)
BUN: 93 mg/dL — ABNORMAL HIGH (ref 6–20)
CO2: 19 mmol/L — ABNORMAL LOW (ref 22–32)
Calcium: 8.3 mg/dL — ABNORMAL LOW (ref 8.9–10.3)
Chloride: 112 mmol/L — ABNORMAL HIGH (ref 98–111)
Creatinine, Ser: 2.4 mg/dL — ABNORMAL HIGH (ref 0.44–1.00)
GFR calc Af Amer: 25 mL/min — ABNORMAL LOW (ref >60)
GFR calc non Af Amer: 21 mL/min — ABNORMAL LOW (ref >60)
Glucose, Bld: 110 mg/dL — ABNORMAL HIGH (ref 70–99)
Phosphorus: 5.6 mg/dL — ABNORMAL HIGH (ref 2.5–4.6)
Potassium: 3.5 mmol/L (ref 3.5–5.1)
Sodium: 143 mmol/L (ref 135–145)

## 2020-05-26 LAB — CBC WITH DIFFERENTIAL/PLATELET
Abs Immature Granulocytes: 0.13 10*3/uL — ABNORMAL HIGH (ref 0.00–0.07)
Basophils Absolute: 0 10*3/uL (ref 0.0–0.1)
Basophils Relative: 1 %
Eosinophils Absolute: 0.2 10*3/uL (ref 0.0–0.5)
Eosinophils Relative: 2 %
HCT: 32.1 % — ABNORMAL LOW (ref 36.0–46.0)
Hemoglobin: 9.7 g/dL — ABNORMAL LOW (ref 12.0–15.0)
Immature Granulocytes: 2 %
Lymphocytes Relative: 26 %
Lymphs Abs: 1.8 10*3/uL (ref 0.7–4.0)
MCH: 29.2 pg (ref 26.0–34.0)
MCHC: 30.2 g/dL (ref 30.0–36.0)
MCV: 96.7 fL (ref 80.0–100.0)
Monocytes Absolute: 0.7 10*3/uL (ref 0.1–1.0)
Monocytes Relative: 10 %
Neutro Abs: 4.1 10*3/uL (ref 1.7–7.7)
Neutrophils Relative %: 59 %
Platelets: 545 10*3/uL — ABNORMAL HIGH (ref 150–400)
RBC: 3.32 MIL/uL — ABNORMAL LOW (ref 3.87–5.11)
RDW: 17.4 % — ABNORMAL HIGH (ref 11.5–15.5)
WBC: 7 10*3/uL (ref 4.0–10.5)
nRBC: 0 % (ref 0.0–0.2)

## 2020-05-26 LAB — GLUCOSE, CAPILLARY
Glucose-Capillary: 101 mg/dL — ABNORMAL HIGH (ref 70–99)
Glucose-Capillary: 108 mg/dL — ABNORMAL HIGH (ref 70–99)
Glucose-Capillary: 109 mg/dL — ABNORMAL HIGH (ref 70–99)
Glucose-Capillary: 111 mg/dL — ABNORMAL HIGH (ref 70–99)
Glucose-Capillary: 126 mg/dL — ABNORMAL HIGH (ref 70–99)
Glucose-Capillary: 145 mg/dL — ABNORMAL HIGH (ref 70–99)

## 2020-05-26 LAB — MAGNESIUM: Magnesium: 2.8 mg/dL — ABNORMAL HIGH (ref 1.7–2.4)

## 2020-05-26 LAB — C-REACTIVE PROTEIN: CRP: 6.4 mg/dL — ABNORMAL HIGH (ref <1.0)

## 2020-05-26 NOTE — Progress Notes (Signed)
Occupational Therapy Treatment Patient Details Name: Wanda [name] MRN: [id number] DOB: [birth year] Today's Date: 05/26/2020    History of present illness Wanda [name] is a 59 y.o. female with medical history significant of diabetes type 2, hypertension, paroxysmal A. fib, hyperlipidemia, ESRD with failed kidney transplant in 2013 and repeat transplant in September 2020 at [facility] with current chronic renal disease stage IV, anemia of chronic disease, history of gastric sleeve surgery, hypoalbuminemia, right foot ulcer, chronic neuropathy presented with worsening right foot ulcer drainage getting worse over the last 2 weeks.  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  Pt has had pallative consult and continues to want aggressive treatment.   OT comments  Pt making gradual progress towards OT goals; demonstrating increased tolerance to EOB activity and able to participate in standing trials, using both face to face method and stedy during standing attempts. Pt continues to require two person assist for safe completion of mobility tasks. She was able to maintain static balance EOB intermittently with minguard assist while participating in simple grooming ADL. Pt continues to reports some dizziness with being upright but with BP stable today. Will continue per POC.   BP start of session: 145/90 After sitting up approx 10 min EOB: 125/50   Follow Up Recommendations  SNF;Supervision/Assistance - 24 hour    Equipment Recommendations  None recommended by OT          Precautions / Restrictions Precautions Precautions: Fall Precaution Comments: new R BKA Other Brace: limb protector Restrictions RLE Weight Bearing: Non weight bearing       Mobility Bed Mobility Overal bed mobility: Needs Assistance Bed Mobility: Supine to Sit;Sit to Supine     Supine to sit: Mod assist;+2 for safety/equipment Sit to supine: Mod assist;+2 for physical assistance;+2 for safety/equipment   General bed mobility comments:  multimodal cues to have pt self-assist, assist to scoot hips and to elevate trunk, initially requiring assist to stabilize   Transfers Overall transfer level: Needs assistance Equipment used: 2 person hand held assist;Ambulation equipment used Transfers: Sit to/from Stand Sit to Stand: Max assist;+2 physical assistance;+2 safety/equipment         General transfer comment: pt performed x3 stands; initial stand via +2 face to face method with L knee block and use of bed pad to cradle hips, trialled additional stands (x2) from EOB using stedy however pt with decreased initiation to self assist and with posterior lean/resistance, able to clear hips from EOB but unable to achieve full stand. pt endorses feeling nervous about standing attempts     Balance Overall balance assessment: Needs assistance Sitting-balance support: Feet supported;Single extremity supported;No upper extremity supported Sitting balance-Leahy Scale: Fair Sitting balance - Comments: able to maintain balance intermittently with close mingaurd assist    Standing balance support: Bilateral upper extremity supported Standing balance-Leahy Scale: Zero Standing balance comment: maxA+2 for standing balance                            ADL either performed or assessed with clinical judgement   ADL Overall ADL's : Needs assistance/impaired Eating/Feeding: Minimal assistance;Sitting Eating/Feeding Details (indicate cue type and reason): taking medications start of session Grooming: Wash/dry face;Min guard;Sitting Grooming Details (indicate cue type and reason): seated EOB, close guard for balance              Lower Body Dressing: Total assistance;Bed level Lower Body Dressing Details (indicate cue type and reason): for donning limp protector, stocking and L  sock             Functional mobility during ADLs: Maximal assistance;+2 for physical assistance;+2 for safety/equipment                          Cognition Arousal/Alertness: Awake/alert Behavior During Therapy: Flat affect Overall Cognitive Status: Impaired/Different from baseline Area of Impairment: Problem solving;Awareness;Safety/judgement;Memory;Following commands                     Memory: Decreased short-term memory Following Commands: Follows one step commands inconsistently;Follows one step commands with increased time Safety/Judgement: Decreased awareness of safety;Decreased awareness of deficits Awareness: Intellectual Problem Solving: Slow processing;Requires verbal cues;Decreased initiation;Difficulty sequencing;Requires tactile cues General Comments: pt with limited insight into current deficits, reports her goal is to walk again but then later stating she hasn't walked in close to a year prior to this admit         Exercises     Shoulder Instructions       General Comments      Pertinent Vitals/ Pain       Pain Assessment: No/denies pain  Home Living                                          Prior Functioning/Environment              Frequency  Min 2X/week        Progress Toward Goals  OT Goals(current goals can now be found in the care plan section)  Progress towards OT goals: Progressing toward goals  Acute Rehab OT Goals Patient Stated Goal: "to walk" OT Goal Formulation: With patient Time For Goal Achievement: 06/03/20 Potential to Achieve Goals: Fair ADL Goals Pt Will Perform Eating: with set-up;with adaptive utensils;bed level Pt Will Perform Grooming: with min assist;sitting;bed level Pt/caregiver will Perform Home Exercise Program: Increased strength;Increased ROM;Both right and left upper extremity;With written HEP provided;With minimal assist Additional ADL Goal #1: Pt will perform bed mobility with modA+2 as precursor to EOB/OOB ADL.  Plan Discharge plan remains appropriate    Co-evaluation    PT/OT/SLP Co-Evaluation/Treatment: Yes Reason for  Co-Treatment: For patient/therapist safety;To address functional/ADL transfers   OT goals addressed during session: ADL's and self-care      AM-PAC OT "6 Clicks" Daily Activity     Outcome Measure   Help from another person eating meals?: A Lot Help from another person taking care of personal grooming?: A Lot Help from another person toileting, which includes using toliet, bedpan, or urinal?: Total Help from another person bathing (including washing, rinsing, drying)?: A Lot Help from another person to put on and taking off regular upper body clothing?: A Lot Help from another person to put on and taking off regular lower body clothing?: Total 6 Click Score: 10    End of Session Equipment Utilized During Treatment: Gait belt  OT Visit Diagnosis: Other abnormalities of gait and mobility (R26.89);Muscle weakness (generalized) (M62.81)   Activity Tolerance Patient tolerated treatment well   Patient Left in bed;with call bell/phone within reach;with bed alarm set   Nurse Communication Mobility status        Time: 5170-0174 OT Time Calculation (min): 33 min  Charges: OT General Charges $OT Visit: 1 Visit OT Treatments $Self Care/Home Management : 8-22 mins  Lou Cal, [place] Pager [phone] Office  [phone]   Raymondo Band 05/26/2020, 12:11 PM

## 2020-05-26 NOTE — Progress Notes (Signed)
Physical Therapy Treatment Patient Details Name: Wanda [name] MRN: [id number] DOB: December 09, 1960 Today's Date: 05/26/2020    History of Present Illness Wanda [name] is a 59 y.o. female with PMHx: DM, HTN, PAfib, HLD, ESRD with failed kidney transplant in 2013 and repeat transplant in September 2020 at [facility] with current chronic renal disease stage IV, anemia of chronic disease, history of gastric sleeve surgery, hypoalbuminemia, right foot ulcer, chronic neuropathy. 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  Pt has had pallative consult and continues to want aggressive treatment despite failure to thrive and inability to place PEG due to gastric sleeve    PT Comments    Pt pleasant and willing to attempt mobility but fearful throughout. Pt states prior to admission family was basically picking her up to put her in a WC and that she hasn't walked in over a year. Pt with dizziness in sitting with VSS. Attempted +2 stand today with pt able to rise but clearly not a progressive goal for pt. Pt with improved sitting balance and will need to return to focus of bed mobility and sitting balance only with need for lift equipment for OOB given pt PLOF and lack of progress acutely.   HR 99-106 BP supine 145/90 (107) Sitting 140/118 (127) initially with prolonged EOB 125/50 (71)   Follow Up Recommendations  SNF     Equipment Recommendations       Recommendations for Other Services       Precautions / Restrictions Precautions Precautions: Fall Precaution Comments: R BKA Other Brace: limb protector Restrictions RLE Weight Bearing: Non weight bearing    Mobility  Bed Mobility Overal bed mobility: Needs Assistance Bed Mobility: Supine to Sit;Sit to Supine     Supine to sit: Mod assist;+2 for safety/equipment Sit to supine: Mod assist;+2 for physical assistance;+2 for safety/equipment   General bed mobility comments: multimodal cues to have pt self-assist,  assist to scoot hips and to elevate trunk, initially requiring assist to stabilize   Transfers Overall transfer level: Needs assistance Equipment used: 2 person hand held assist;Ambulation equipment used Transfers: Sit to/from Stand Sit to Stand: Max assist;+2 physical assistance;+2 safety/equipment     Anterior-Posterior transfers: +2 physical assistance;Total assist   General transfer comment: pt performed x3 stands; initial stand via +2 face to face method with L knee block and use of bed pad to cradle hips, trialled additional stands (x2) from EOB using stedy however pt with decreased initiation to self assist and with posterior lean/resistance, able to clear hips from EOB but unable to achieve full stand. pt endorses feeling nervous about standing attempts   Ambulation/Gait                 Stairs             Wheelchair Mobility    Modified Rankin (Stroke Patients Only)       Balance Overall balance assessment: Needs assistance Sitting-balance support: Feet supported;Single extremity supported;No upper extremity supported Sitting balance-Leahy Scale: Fair Sitting balance - Comments: able to maintain balance intermittently with close minguard assist   Standing balance support: Bilateral upper extremity supported Standing balance-Leahy Scale: Zero Standing balance comment: maxA+2 for standing balance                             Cognition Arousal/Alertness: Awake/alert Behavior During Therapy: Flat affect Overall Cognitive Status: Impaired/Different from baseline Area of Impairment: Problem solving;Awareness;Safety/judgement;Memory;Following commands  Memory: Decreased short-term memory Following Commands: Follows one step commands inconsistently;Follows one step commands with increased time Safety/Judgement: Decreased awareness of safety;Decreased awareness of deficits Awareness: Intellectual Problem Solving: Slow  processing;Requires verbal cues;Decreased initiation;Difficulty sequencing;Requires tactile cues General Comments: pt with limited insight into current deficits, reports her goal is to walk again but then later stating she hasn't walked in close to a year prior to this admit       Exercises      General Comments        Pertinent Vitals/Pain Pain Assessment: No/denies pain    Home Living                      Prior Function            PT Goals (current goals can now be found in the care plan section) Acute Rehab PT Goals Patient Stated Goal: "to walk" Progress towards PT goals: Not progressing toward goals - comment    Frequency    Min 2X/week      PT Plan Current plan remains appropriate    Co-evaluation PT/OT/SLP Co-Evaluation/Treatment: Yes Reason for Co-Treatment: Complexity of the patient's impairments (multi-system involvement);For patient/therapist safety PT goals addressed during session: Mobility/safety with mobility;Balance OT goals addressed during session: ADL's and self-care      AM-PAC PT "6 Clicks" Mobility   Outcome Measure  Help needed turning from your back to your side while in a flat bed without using bedrails?: Total Help needed moving from lying on your back to sitting on the side of a flat bed without using bedrails?: Total Help needed moving to and from a bed to a chair (including a wheelchair)?: Total Help needed standing up from a chair using your arms (e.g., wheelchair or bedside chair)?: Total Help needed to walk in hospital room?: Total Help needed climbing 3-5 steps with a railing? : Total 6 Click Score: 6    End of Session Equipment Utilized During Treatment: Gait belt   Patient left: in bed;with call bell/phone within reach;with bed alarm set Nurse Communication: Mobility status;Need for lift equipment PT Visit Diagnosis: Muscle weakness (generalized) (M62.81);Other abnormalities of gait and mobility (R26.89)      Time: 4628-6381 PT Time Calculation (min) (ACUTE ONLY): 28 min  Charges:  $Therapeutic Activity: 8-22 mins                     Wanda [name], PT Acute Rehabilitation Services Pager: [phone] Office: Wanda [name] 05/26/2020, 1:47 PM

## 2020-05-26 NOTE — Progress Notes (Signed)
PROGRESS NOTE    Wanda [name]  BTD:[id number] DOB: 08/25/1961 DOA: 05/01/2020 PCP: Lorene Dy, MD     Brief Narrative:  Wanda [name] is an 59 y.o. 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  X-ray did not show any evidence of osteomyelitis, she was started on empiric antibiotics and IV fluids, orthopedic surgery was consulted and she underwent a BKA.  Due to significant poor oral intake a core track was placed and she was started on tube feedings.  As per RN the patient has been eating 25 to 50% of her regular meals.  Palliative care was consulted and they met with family and they would like to continue to aggressively treat the treatable and if no improvement then they would think about hospice.  Hospital course was complicated by fever and Klebsiella bacteremia which is the source has remained unclear.  Chest x-ray does not show any infection.  MRI of the foot does not show osteomyelitis.  A-line was removed on 05/17/2020.  She was started empirically on IV cefepime.  ID was consulted who recommended to change her antibiotics to IV meropenem.  A TTE showed possible mitral valve vegetation, TEE was ordered but patient became hypotensive prior to procedure in the TEE was unable to be completed. ID recommended at least 6 weeks of empiric antibiotics.  Meanwhile general surgery was consulted for G-tube placement due to her significant poor oral intake and deconditioning, IR was unable to place PEG tube due to her gastric sleeve.  General surgery recommended to decrease her tube feedings and encourage oral intake.   Subjective: 8/24 A/O x4, negative S OB, negative CP, negative abdominal pain.   Assessment & Plan: Covid  vaccination; negative vaccination   Principal Problem:   Septic shock ([place]) Active Problems:   DM (diabetes mellitus), type 2 with renal complications ([place])   Essential hypertension   CHRONIC KIDNEY DISEASE STAGE IV ([place])   Renal transplant disorder   Depression   History of kidney transplant   Wound infection   Leukocytosis   Pressure injury of skin   Palliative care by specialist   Goals of care, counseling/discussion   DNR (do not resuscitate) discussion   Failure to thrive in adult   Pain of foot   Bacteremia due to Klebsiella pneumoniae   Septic shock ([place]): -Right foot ulcer with surrounding cellulitis she completed a course of IV Zosyn. -MRI did not show any evidence of osteomyelitis but it did show a superficial phlegmon on the posterior calcaneus. 8/5 s/p RIGHT BKA by Orthopedic surgery  -8/13 she started having fever and LEFT foot pain , and X-ray of the foot did not show any evidence of osteomyelitis. 8/13, blood cultures were positive for Klebsiella; started on IV cefepime which was later changed to IV meropenem,  -ID was consulted and the source of infection remains unclear. -An MRI of the foot without contrast did not show osteomyelitis but it did show superficial ulceration over the posterior calcaneus with heel pad inflammation no loculated fluid or collection.   Her CRP was 13 procalcitonin was 5 ID was consulted who recommended TTE that showed possible mitral vegetation,  -TEE was scheduled but she became hypotensive and was deemed unsafe to do so. ID recommended to continue empiric antibiotics for 6 weeks.  Start date 05/16/2020.  Paroxysmal atrial fibrillation:  -Currently on amiodarone 200 and Eliquis.   -Her chads Vascor is greater than 3.  Hypotension: -Resolved.  Chronic kidney disease IV: (Baseline Cr~2.74) Lab Results  Component Value Date   CREATININE 2.40 (H) 05/26/2020   CREATININE 2.58 (H) 05/25/2020   CREATININE 2.49 (H) 05/24/2020    CREATININE 2.62 (H) 05/23/2020   CREATININE 2.49 (H) 05/22/2020  -Better than baseline  Hyperkalemia with peaked T waves: -Nephrology was consulted  -Resolved was treated with Lokelma  Severe protein caloric malnutrition/cachexia: -Core track was placed and feeding tube was started.   -Unable to place a PEG tube due to her gastric sleeve. -Surgery was consulted recommended to decrease tube feedings and encourage oral intake. She ate about 25% of her food over the last 24 hours with discontinue tube feedings keep NG tube in place.  Severe protein calorie malnutrition -Continue to encourage patient to eat and drink. -Marinol 5 mg BID -Nutrition has been consulted continue tube feedings per core track.  DM type II controlled; DM neuropathy/DM neuropathy w 7/31 hemoglobin A1c = 4.1  Klebsiella Bacteremia: As stated above, echo showed vegetation TEE could not be performed as she became hypotensive ID recommended to continue meropenem for at least 6 weeks. -We will reconsult cardiology as patient's BP slightly elevated and see if they are willing to perform TEE -In the meantime continue current antibiotics last day 9/17  Pressure Injury 05/01/20 Heel Left Unstageable - Full thickness tissue loss in which the base of the injury is covered by slough (yellow, tan, gray, green or brown) and/or eschar (tan, brown or black) in the wound bed. dark discoloration unstageable bugg (Active)  05/01/20 2330  Location: Heel  Location Orientation: Left  Staging: Unstageable - Full thickness tissue loss in which the base of the injury is covered by slough (yellow, tan, gray, green or brown) and/or eschar (tan, brown or black) in the wound bed.  Wound Description (Comments): dark discoloration unstageable buggy  Present on Admission: Yes   Goals of care  -8/23 out of bed to chair q shift for at least 2 to 3 hours.  All lights on and shades open during the daylight hours.  -8/24 [facility] turned  patient down, will need to go to an SNF very unlikely   DVT prophylaxis: Eliquis Code Status: Full Family Communication:  Status is: Inpatient  Stable  Dispo: The patient is from: Home              Anticipated d/c is to: Home              Anticipated d/c date is: Per surgery              Patient currently unstable      Consultants:  Palliative care [facility]   Procedures/Significant Events:    I have personally reviewed and interpreted all radiology studies and my findings are as above.  VENTILATOR SETTINGS:    Cultures   Antimicrobials: Anti-infectives (From admission, onward)   Start     Ordered Stop   05/18/20 1030  meropenem (MERREM) 1 g in sodium chloride 0.9 % 100 mL IVPB        05/18/20 1001     05/16/20 1400  ceFEPIme (MAXIPIME) 2 g in sodium chloride 0.9 % 100 mL IVPB  Status:  Discontinued        05/16/20 1351 05/18/20 1001   05/07/20 1400  vancomycin (VANCOCIN) IVPB 1000 mg/200 mL premix  05/07/20 0741 05/07/20 1550   05/05/20 1330  piperacillin-tazobactam (ZOSYN) IVPB 3.375 g  Status:  Discontinued        05/05/20 1251 05/12/20 1250   05/02/20 1600  vancomycin (VANCOREADY) IVPB 750 mg/150 mL  Status:  Discontinued        05/01/20 1612 05/05/20 1251   05/02/20 1600  cefTRIAXone (ROCEPHIN) 2 g in sodium chloride 0.9 % 100 mL IVPB  Status:  Discontinued        05/01/20 1700 05/05/20 1250   05/01/20 1715  valGANciclovir (VALCYTE) 450 MG tablet TABS 450 mg  Status:  Discontinued        05/01/20 1700 05/01/20 1847   05/01/20 1500  cefTRIAXone (ROCEPHIN) 2 g in sodium chloride 0.9 % 100 mL IVPB        05/01/20 1452 05/01/20 1638   05/01/20 1500  vancomycin (VANCOREADY) IVPB 1500 mg/300 mL        05/01/20 1453 05/01/20 1755   05/01/20 1445  vancomycin (VANCOCIN) IVPB 1000 mg/200 mL premix  Status:  Discontinued        05/01/20 1444 05/01/20 1453      Devices    LINES / TUBES:      Continuous Infusions:  sodium chloride 500 mL (05/22/20 2230)    meropenem (MERREM) IV 1 g (05/26/20 0918)     Objective: Vitals:   05/26/20 0731 05/26/20 1114 05/26/20 1620 05/26/20 2010  BP: (!) 148/57 128/64 (!) 142/45 (!) 131/59  Pulse: 95 94 83 98  Resp: '16 16 16 18  ' Temp: 97.6 F (36.4 C) (!) 97.5 F (36.4 C) 98.3 F (36.8 C) 98.3 F (36.8 C)  TempSrc: Oral Oral Oral Oral  SpO2: 98% 100% 100% 100%  Weight:      Height:        Intake/Output Summary (Last 24 hours) at 05/26/2020 2021 Last data filed at 05/26/2020 0814 Gross per 24 hour  Intake 730 ml  Output --  Net 730 ml   Filed Weights   05/19/20 0524 05/22/20 0423 05/26/20 0700  Weight: 71.4 kg 69.9 kg 72.1 kg    Examination:  General: A/O x4, No acute respiratory distress Eyes: negative scleral hemorrhage, negative anisocoria, negative icterus ENT: Negative Runny nose, negative gingival bleeding, Neck:  Negative scars, masses, torticollis, lymphadenopathy, JVD Lungs: Clear to auscultation bilaterally without wheezes or crackles Cardiovascular: Regular rate and rhythm without murmur gallop or rub normal S1 and S2 Abdomen: negative abdominal pain, nondistended, positive soft, bowel sounds, no rebound, no ascites, no appreciable mass Extremities: RIGHT BKA dressed with Ace wrap.  No sign of discharge did not take dressing down. Skin: Negative rashes, lesions, ulcers Psychiatric:  Negative depression, negative anxiety, negative fatigue, negative mania  Central nervous system:  Cranial nerves II through XII intact, tongue/uvula midline, all extremities muscle strength 5/5, sensation intact throughout, negative dysarthria, negative expressive aphasia, negative receptive aphasia.  .     Data Reviewed: Care during the described time interval was provided by me .  I have reviewed this patient's available data, including medical history, events of note, physical examination, and all test results as part of my evaluation.  CBC: Recent Labs  Lab 05/22/20 0319 05/23/20 0147  05/24/20 0157 05/25/20 0649 05/26/20 0350  WBC 5.8 6.3 6.4 6.5 7.0  NEUTROABS 3.3 3.7 3.5 3.8 4.1  HGB 10.0* 10.7* 9.7* 10.7* 9.7*  HCT 32.7* 34.1* 31.5* 34.3* 32.1*  MCV 96.7 96.3 96.3 97.4 96.7  PLT 482* 545* 563* 609* 545*  Basic Metabolic Panel: Recent Labs  Lab 05/22/20 0319 05/23/20 0147 05/24/20 0157 05/25/20 0649 05/26/20 0350  NA 137 139 141 141 143  K 4.1 3.8 3.6 3.3* 3.5  CL 107 107 107 108 112*  CO2 20* 20* 20* 20* 19*  GLUCOSE 116* 122* 93 114* 110*  BUN 100* 96* 98* 99* 93*  CREATININE 2.49* 2.62* 2.49* 2.58* 2.40*  CALCIUM 8.5* 8.7* 8.5* 8.4* 8.3*  MG 2.6* 2.5* 2.6* 2.7* 2.8*  PHOS 4.0 4.4 4.9* 5.4* 5.6*   GFR: Estimated Creatinine Clearance: 26.4 mL/min (A) (by C-G formula based on SCr of 2.4 mg/dL (H)). Liver Function Tests: Recent Labs  Lab 05/22/20 0319 05/23/20 0147 05/24/20 0157 05/25/20 0649 05/26/20 0350  ALBUMIN 1.3* 1.3* 1.4* 1.5* 1.5*   No results for input(s): LIPASE, AMYLASE in the last 168 hours. No results for input(s): AMMONIA in the last 168 hours. Coagulation Profile: No results for input(s): INR, PROTIME in the last 168 hours. Cardiac Enzymes: No results for input(s): CKTOTAL, CKMB, CKMBINDEX, TROPONINI in the last 168 hours. BNP (last 3 results) No results for input(s): PROBNP in the last 8760 hours. HbA1C: No results for input(s): HGBA1C in the last 72 hours. CBG: Recent Labs  Lab 05/26/20 0433 05/26/20 0815 05/26/20 1224 05/26/20 1629 05/26/20 2015  GLUCAP 101* 109* 145* 108* 126*   Lipid Profile: No results for input(s): CHOL, HDL, LDLCALC, TRIG, CHOLHDL, LDLDIRECT in the last 72 hours. Thyroid Function Tests: No results for input(s): TSH, T4TOTAL, FREET4, T3FREE, THYROIDAB in the last 72 hours. Anemia Panel: No results for input(s): VITAMINB12, FOLATE, FERRITIN, TIBC, IRON, RETICCTPCT in the last 72 hours. Sepsis Labs: No results for input(s): PROCALCITON, LATICACIDVEN in the last 168 hours.  No results  found for this or any previous visit (from the past 240 hour(s)).       Radiology Studies: No results found.      Scheduled Meds:  sodium chloride   Intravenous Once   amiodarone  200 mg Oral Daily   apixaban  5 mg Oral BID   Chlorhexidine Gluconate Cloth  6 each Topical Daily   darbepoetin (ARANESP) injection - NON-DIALYSIS  150 mcg Subcutaneous Q Mon-1800   dronabinol  5 mg Oral BID AC   feeding supplement (ENSURE ENLIVE)  237 mL Oral TID BM   feeding supplement (PROSource TF)  90 mL Per Tube BID   folic acid  1 mg Oral Daily   insulin aspart  0-15 Units Subcutaneous TID WC   insulin aspart  0-5 Units Subcutaneous QHS   insulin aspart  4 Units Subcutaneous Q4H   lidocaine  5 mL Intradermal Once   mirtazapine  7.5 mg Oral QHS   multivitamin with minerals  1 tablet Oral Daily   nortriptyline  25 mg Oral QHS   pantoprazole  40 mg Oral Daily   predniSONE  5 mg Oral Q breakfast   senna-docusate  1 tablet Oral Daily   sodium bicarbonate  650 mg Oral BID   tacrolimus  1.5 mg Oral BID   thiamine  100 mg Oral Daily   vitamin C  250 mg Oral BID   zinc sulfate  220 mg Oral Daily   Continuous Infusions:  sodium chloride 500 mL (05/22/20 2230)   meropenem (MERREM) IV 1 g (05/26/20 0918)     LOS: 25 days    Time spent:40 min    Erlin Gardella, Geraldo Docker, MD [facility] Pager [phone]  If 7PM-7AM, please contact night-coverage [email or web address] Password [facility] 05/26/2020,  8:21 PM

## 2020-05-27 LAB — GLUCOSE, CAPILLARY
Glucose-Capillary: 106 mg/dL — ABNORMAL HIGH (ref 70–99)
Glucose-Capillary: 115 mg/dL — ABNORMAL HIGH (ref 70–99)
Glucose-Capillary: 117 mg/dL — ABNORMAL HIGH (ref 70–99)
Glucose-Capillary: 124 mg/dL — ABNORMAL HIGH (ref 70–99)
Glucose-Capillary: 124 mg/dL — ABNORMAL HIGH (ref 70–99)
Glucose-Capillary: 151 mg/dL — ABNORMAL HIGH (ref 70–99)
Glucose-Capillary: 168 mg/dL — ABNORMAL HIGH (ref 70–99)

## 2020-05-27 LAB — C-REACTIVE PROTEIN: CRP: 5.3 mg/dL — ABNORMAL HIGH (ref <1.0)

## 2020-05-27 LAB — RENAL FUNCTION PANEL
Albumin: 1.4 g/dL — ABNORMAL LOW (ref 3.5–5.0)
Anion gap: 13 (ref 5–15)
BUN: 96 mg/dL — ABNORMAL HIGH (ref 6–20)
CO2: 19 mmol/L — ABNORMAL LOW (ref 22–32)
Calcium: 8.2 mg/dL — ABNORMAL LOW (ref 8.9–10.3)
Chloride: 113 mmol/L — ABNORMAL HIGH (ref 98–111)
Creatinine, Ser: 2.39 mg/dL — ABNORMAL HIGH (ref 0.44–1.00)
GFR calc Af Amer: 25 mL/min — ABNORMAL LOW (ref >60)
GFR calc non Af Amer: 21 mL/min — ABNORMAL LOW (ref >60)
Glucose, Bld: 129 mg/dL — ABNORMAL HIGH (ref 70–99)
Phosphorus: 5.7 mg/dL — ABNORMAL HIGH (ref 2.5–4.6)
Potassium: 3.6 mmol/L (ref 3.5–5.1)
Sodium: 145 mmol/L (ref 135–145)

## 2020-05-27 LAB — CBC WITH DIFFERENTIAL/PLATELET
Abs Immature Granulocytes: 0.13 10*3/uL — ABNORMAL HIGH (ref 0.00–0.07)
Basophils Absolute: 0.1 10*3/uL (ref 0.0–0.1)
Basophils Relative: 1 %
Eosinophils Absolute: 0.1 10*3/uL (ref 0.0–0.5)
Eosinophils Relative: 2 %
HCT: 33.8 % — ABNORMAL LOW (ref 36.0–46.0)
Hemoglobin: 10.2 g/dL — ABNORMAL LOW (ref 12.0–15.0)
Immature Granulocytes: 2 %
Lymphocytes Relative: 27 %
Lymphs Abs: 1.9 10*3/uL (ref 0.7–4.0)
MCH: 29.5 pg (ref 26.0–34.0)
MCHC: 30.2 g/dL (ref 30.0–36.0)
MCV: 97.7 fL (ref 80.0–100.0)
Monocytes Absolute: 0.7 10*3/uL (ref 0.1–1.0)
Monocytes Relative: 10 %
Neutro Abs: 4.1 10*3/uL (ref 1.7–7.7)
Neutrophils Relative %: 58 %
Platelets: 490 10*3/uL — ABNORMAL HIGH (ref 150–400)
RBC: 3.46 MIL/uL — ABNORMAL LOW (ref 3.87–5.11)
RDW: 17.5 % — ABNORMAL HIGH (ref 11.5–15.5)
WBC: 7 10*3/uL (ref 4.0–10.5)
nRBC: 0.3 % — ABNORMAL HIGH (ref 0.0–0.2)

## 2020-05-27 LAB — MAGNESIUM: Magnesium: 2.7 mg/dL — ABNORMAL HIGH (ref 1.7–2.4)

## 2020-05-27 LAB — TACROLIMUS LEVEL: Tacrolimus (FK506) - LabCorp: 4.7 ng/mL (ref 2.0–20.0)

## 2020-05-27 NOTE — Progress Notes (Signed)
Patient says that the cortrack is making it hard for her to swallow and she wants to have it removed.  Will inform next nurse and continue to monitor.  Lupita Dawn, RN

## 2020-05-27 NOTE — Progress Notes (Signed)
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  Patient was initially started on broad-spectrum antibiotics and orthopedics was consulted and patient underwent BKA.  Due to prolonged poor oral intake, patient was started on tube feedings via core track.  Patient has been evaluated by palliative care and patient family wish to continue aggressive measures.  Hospital course was complicated by fever and Klebsiella bacteremia which is the source has remained unclear. Chest x-ray does not show any infection. MRI of the foot does not show osteomyelitis. A-line was removed on 05/17/2020. She was started empirically on IV cefepime. ID was consulted who recommended to change her antibiotics to IV meropenem. A TTE showed possible mitral valve vegetation, TEE was ordered but patient became hypotensive prior to procedure in the TEE was unable to be completed. ID recommended at least 6 weeks of empiric antibiotics.  Meanwhile general surgery was consulted for G-tube placement due to her significant poor oral intake and deconditioning, IR was unable to place PEG tube due to her gastric sleeve. General surgery recommended to decrease her tube feedings and encourage oral intake.   Assessment & Plan:   Principal Problem:   Septic shock ([place]) Active Problems:   DM (diabetes mellitus), type 2 with renal complications ([place])   Essential hypertension   CHRONIC KIDNEY DISEASE STAGE IV ([place])   Renal transplant disorder   Depression   History of kidney  transplant   Wound infection   Leukocytosis   Pressure injury of skin   Palliative care by specialist   Goals of care, counseling/discussion   DNR (do not resuscitate) discussion   Failure to thrive in adult   Pain of foot   Bacteremia due to Klebsiella pneumoniae   Septic shock secondary to right foot ulcer/cellulitis; POA Klebsiella septicemia X-ray foot negative for osteomyelitis.  MRI also with no evidence of osteomyelitis but showed a superficial phlegmon posterior calcaneus consistent with gangrene.  Orthopedics was consulted and patient underwent right BKA by Dr. Doran Durand on 05/07/2020.  Blood cultures positive for Klebsiella.  TTE showed possible mitral vegetation.  Patient was scheduled for TEE but became hypotensive and was deemed unsafe which was aborted.  Seen by infectious disease and recommended to continue empiric antibiotics for 6 weeks with meropenem with start date of 05/16/2020.  Paroxysmal atrial fibrillation --Continue amiodarone 200 mg p.o. daily --Continue Eliquis for anticoagulation  Hypotension: Resolved --Continue to monitor blood pressure closely  Chronic kidney disease stage IV s/p transplant Baseline creatinine 2.74.  History of failed renal transplant 2013 with retransplant 2020. --Creatinine 2.39 today --Continue immunosuppressants with tacrolimus 1.5 mg p.o. twice daily prednisone 5 mg p.o. daily --Avoid nephrotoxins, renally dose all medications --Monitor renal function closely daily  Hyperkalemia with peaked T waves: Resolved Nephrology was consulted, resolved with [facility] treatment. --Potassium 3.6 today --Continue monitor renal function/electrolytes daily  Type 2 diabetes mellitus with diabetic neuropathy Hemoglobin A1c 4.1, well controlled. --Insulin sliding scale for coverage  GERD: Continue Protonix 40 mg p.o. daily  Severe protein calorie malnutrition Body mass index is 22.59 kg/m. Nutrition Status: Nutrition Problem: Severe  Malnutrition Etiology: chronic illness Signs/Symptoms:  moderate fat depletion, severe muscle depletion, percent weight loss Interventions: Prostat, Ensure Enlive (each supplement provides 350kcal and 20 grams of protein) --Core track was placed and tube feeds were initially started.  Unable to place PEG tube due to her gastric sleeve General surgery was consulted for possible assistance of feeding tube placement but recommended to decrease her tube feedings encourage oral intake.  Patient complaining that her core track tube is inhibiting her oral intake and wishes to discontinue. --DC core track tube today --Continue to encourage increased oral intake --Dietitian consultation for calorie count --Marinol 5 mg p.o. twice daily and Remeron 7.5 mg p.o. nightly   Pressure Injury 05/01/20 Heel Left Unstageable - Full thickness tissue loss in which the base of the injury is covered by slough (yellow, tan, gray, green or brown) and/or eschar (tan, brown or black) in the wound bed. dark discoloration unstageable bugg (Active)  05/01/20 2330  Location: Heel  Location Orientation: Left  Staging: Unstageable - Full thickness tissue loss in which the base of the injury is covered by slough (yellow, tan, gray, green or brown) and/or eschar (tan, brown or black) in the wound bed.  Wound Description (Comments): dark discoloration unstageable buggy  Present on Admission: Yes     DVT prophylaxis: Eliquis Code Status: Full code Family Communication: No family present at bedside this morning  Disposition Plan:  Status is: Inpatient  Remains inpatient appropriate because:Ongoing diagnostic testing needed not appropriate for outpatient work up, Unsafe d/c plan, IV treatments appropriate due to intensity of illness or inability to take PO and Inpatient level of care appropriate due to severity of illness needs SNF placement but needs to discontinue core track tube for able to transfer.  Needs to increase her  oral intake due to her severe protein calorie malnutrition.  Has been seen by palliative care but wants to continue aggressive measures at this time.  If no significant improvement may need to reconsider PEG tube versus more palliative approach.   Dispo: The patient is from: Home              Anticipated d/c is to: SNF              Anticipated d/c date is: 3 days              Patient currently is not medically stable to d/c.   Consultants:   Cardiology  Infectious disease  Nephrology  Palliative care  Interventional radiology  Orthopedics  Procedures:   BKA right, Dr. Doran Durand 05/07/2020  Antimicrobials:   Meropenem 8/16>>  Cefepime 8/14 - 8/16  Vancomycin 7/30- 7/31  Zosyn 8/3 - 8/10  Ceftriaxone 7/30 - 8/3  Subjective: Patient seen and examined bedside, resting comfortably.  Continues with profound weakness and fatigue.  Requesting removal of core track tube as she states this is inhibiting her oral intake.  Discussed with patient needs of increased oral intake otherwise will continue to deteriorate and become severely deconditioned.  No other complaints or concerns at this time.  Denies headache, no fever/chills/night sweats, no nausea cefonicid/diarrhea, no chest pain, no palpitations, no shortness of breath, no abdominal pain.  No acute events overnight per nursing staff.  Objective: Vitals:   05/26/20 2010 05/27/20 0029 05/27/20 0433 05/27/20 0800  BP: (!) 131/59 135/90 (!) 152/73 120/73  Pulse: 98 99 98 98  Resp: 18 18 18    Temp: 98.3 F (36.8 C) 98.2 F (36.8 C) 98 F (36.7 C) 98.1 F (36.7 C)  TempSrc:  Oral Oral Oral Oral  SpO2: 100% 100% 100% 100%  Weight:   69.4 kg   Height:        Intake/Output Summary (Last 24 hours) at 05/27/2020 1300 Last data filed at 05/27/2020 0039 Gross per 24 hour  Intake 1140.65 ml  Output --  Net 1140.65 ml   Filed Weights   05/22/20 0423 05/26/20 0700 05/27/20 0433  Weight: 69.9 kg 72.1 kg 69.4 kg     Examination:  General exam: Appears calm and comfortable, cachectic in appearance, chronically ill in appearance Respiratory system: Clear to auscultation. Respiratory effort normal.  Oxygen well on room air Cardiovascular system: S1 & S2 heard, RRR. No JVD, murmurs, rubs, gallops or clicks. No pedal edema. Gastrointestinal system: Abdomen is nondistended, soft and nontender. No organomegaly or masses felt. Normal bowel sounds heard. Central nervous system: Alert and oriented. No focal neurological deficits. Extremities: Symmetric 5 x 5 power. Skin: No rashes, lesions or ulcers Psychiatry: Judgement and insight appear poor. Mood & affect appropriate.     Data Reviewed: I have personally reviewed following labs and imaging studies  CBC: Recent Labs  Lab 05/23/20 0147 05/24/20 0157 05/25/20 0649 05/26/20 0350 05/27/20 0327  WBC 6.3 6.4 6.5 7.0 7.0  NEUTROABS 3.7 3.5 3.8 4.1 4.1  HGB 10.7* 9.7* 10.7* 9.7* 10.2*  HCT 34.1* 31.5* 34.3* 32.1* 33.8*  MCV 96.3 96.3 97.4 96.7 97.7  PLT 545* 563* 609* 545* 182*   Basic Metabolic Panel: Recent Labs  Lab 05/23/20 0147 05/24/20 0157 05/25/20 0649 05/26/20 0350 05/27/20 0327  NA 139 141 141 143 145  K 3.8 3.6 3.3* 3.5 3.6  CL 107 107 108 112* 113*  CO2 20* 20* 20* 19* 19*  GLUCOSE 122* 93 114* 110* 129*  BUN 96* 98* 99* 93* 96*  CREATININE 2.62* 2.49* 2.58* 2.40* 2.39*  CALCIUM 8.7* 8.5* 8.4* 8.3* 8.2*  MG 2.5* 2.6* 2.7* 2.8* 2.7*  PHOS 4.4 4.9* 5.4* 5.6* 5.7*   GFR: Estimated Creatinine Clearance: 26.5 mL/min (A) (by C-G formula based on SCr of 2.39 mg/dL (H)). Liver Function Tests: Recent Labs  Lab 05/23/20 0147 05/24/20 0157 05/25/20 0649 05/26/20 0350 05/27/20 0327  ALBUMIN 1.3* 1.4* 1.5* 1.5* 1.4*   No results for input(s): LIPASE, AMYLASE in the last 168 hours. No results for input(s): AMMONIA in the last 168 hours. Coagulation Profile: No results for input(s): INR, PROTIME in the last 168 hours. Cardiac  Enzymes: No results for input(s): CKTOTAL, CKMB, CKMBINDEX, TROPONINI in the last 168 hours. BNP (last 3 results) No results for input(s): PROBNP in the last 8760 hours. HbA1C: No results for input(s): HGBA1C in the last 72 hours. CBG: Recent Labs  Lab 05/26/20 1629 05/26/20 2015 05/27/20 0033 05/27/20 0430 05/27/20 0755  GLUCAP 108* 126* 124* 117* 124*   Lipid Profile: No results for input(s): CHOL, HDL, LDLCALC, TRIG, CHOLHDL, LDLDIRECT in the last 72 hours. Thyroid Function Tests: No results for input(s): TSH, T4TOTAL, FREET4, T3FREE, THYROIDAB in the last 72 hours. Anemia Panel: No results for input(s): VITAMINB12, FOLATE, FERRITIN, TIBC, IRON, RETICCTPCT in the last 72 hours. Sepsis Labs: No results for input(s): PROCALCITON, LATICACIDVEN in the last 168 hours.  No results found for this or any previous visit (from the past 240 hour(s)).       Radiology Studies: No results found.      Scheduled Meds: . sodium chloride   Intravenous Once  . amiodarone  200 mg Oral Daily  . apixaban  5 mg Oral BID  .  Chlorhexidine Gluconate Cloth  6 each Topical Daily  . darbepoetin (ARANESP) injection - NON-DIALYSIS  150 mcg Subcutaneous Q Mon-1800  . dronabinol  5 mg Oral BID AC  . feeding supplement (ENSURE ENLIVE)  237 mL Oral TID BM  . feeding supplement (PROSource TF)  90 mL Per Tube BID  . folic acid  1 mg Oral Daily  . insulin aspart  0-15 Units Subcutaneous TID WC  . insulin aspart  0-5 Units Subcutaneous QHS  . insulin aspart  4 Units Subcutaneous Q4H  . lidocaine  5 mL Intradermal Once  . mirtazapine  7.5 mg Oral QHS  . multivitamin with minerals  1 tablet Oral Daily  . nortriptyline  25 mg Oral QHS  . pantoprazole  40 mg Oral Daily  . predniSONE  5 mg Oral Q breakfast  . senna-docusate  1 tablet Oral Daily  . sodium bicarbonate  650 mg Oral BID  . tacrolimus  1.5 mg Oral BID  . thiamine  100 mg Oral Daily  . vitamin C  250 mg Oral BID  . zinc sulfate  220  mg Oral Daily   Continuous Infusions: . sodium chloride Stopped (05/24/20 2105)  . meropenem (MERREM) IV 1 g (05/27/20 0839)     LOS: 26 days    Time spent: 39 minutes spent on chart review, discussion with nursing staff, consultants, updating family and interview/physical exam; more than 50% of that time was spent in counseling and/or coordination of care.    Haja Crego J [place] DO [facility] Available via Epic secure chat 7am-7pm After these hours, please refer to coverage provider listed on [email or web address] 05/27/2020, 1:00 PM

## 2020-05-28 LAB — CBC WITH DIFFERENTIAL/PLATELET
Abs Immature Granulocytes: 0.12 10*3/uL — ABNORMAL HIGH (ref 0.00–0.07)
Basophils Absolute: 0.1 10*3/uL (ref 0.0–0.1)
Basophils Relative: 1 %
Eosinophils Absolute: 0.1 10*3/uL (ref 0.0–0.5)
Eosinophils Relative: 1 %
HCT: 35 % — ABNORMAL LOW (ref 36.0–46.0)
Hemoglobin: 10.4 g/dL — ABNORMAL LOW (ref 12.0–15.0)
Immature Granulocytes: 2 %
Lymphocytes Relative: 27 %
Lymphs Abs: 2 10*3/uL (ref 0.7–4.0)
MCH: 29.5 pg (ref 26.0–34.0)
MCHC: 29.7 g/dL — ABNORMAL LOW (ref 30.0–36.0)
MCV: 99.4 fL (ref 80.0–100.0)
Monocytes Absolute: 0.7 10*3/uL (ref 0.1–1.0)
Monocytes Relative: 10 %
Neutro Abs: 4.4 10*3/uL (ref 1.7–7.7)
Neutrophils Relative %: 59 %
Platelets: 476 10*3/uL — ABNORMAL HIGH (ref 150–400)
RBC: 3.52 MIL/uL — ABNORMAL LOW (ref 3.87–5.11)
RDW: 17.6 % — ABNORMAL HIGH (ref 11.5–15.5)
WBC: 7.4 10*3/uL (ref 4.0–10.5)
nRBC: 0 % (ref 0.0–0.2)

## 2020-05-28 LAB — RENAL FUNCTION PANEL
Albumin: 1.5 g/dL — ABNORMAL LOW (ref 3.5–5.0)
Anion gap: 15 (ref 5–15)
BUN: 88 mg/dL — ABNORMAL HIGH (ref 6–20)
CO2: 15 mmol/L — ABNORMAL LOW (ref 22–32)
Calcium: 8.3 mg/dL — ABNORMAL LOW (ref 8.9–10.3)
Chloride: 112 mmol/L — ABNORMAL HIGH (ref 98–111)
Creatinine, Ser: 2.34 mg/dL — ABNORMAL HIGH (ref 0.44–1.00)
GFR calc Af Amer: 26 mL/min — ABNORMAL LOW (ref >60)
GFR calc non Af Amer: 22 mL/min — ABNORMAL LOW (ref >60)
Glucose, Bld: 85 mg/dL (ref 70–99)
Phosphorus: 5.5 mg/dL — ABNORMAL HIGH (ref 2.5–4.6)
Potassium: 4.2 mmol/L (ref 3.5–5.1)
Sodium: 142 mmol/L (ref 135–145)

## 2020-05-28 LAB — GLUCOSE, CAPILLARY
Glucose-Capillary: 102 mg/dL — ABNORMAL HIGH (ref 70–99)
Glucose-Capillary: 102 mg/dL — ABNORMAL HIGH (ref 70–99)
Glucose-Capillary: 104 mg/dL — ABNORMAL HIGH (ref 70–99)
Glucose-Capillary: 108 mg/dL — ABNORMAL HIGH (ref 70–99)
Glucose-Capillary: 117 mg/dL — ABNORMAL HIGH (ref 70–99)
Glucose-Capillary: 129 mg/dL — ABNORMAL HIGH (ref 70–99)

## 2020-05-28 LAB — MAGNESIUM: Magnesium: 2.8 mg/dL — ABNORMAL HIGH (ref 1.7–2.4)

## 2020-05-28 LAB — C-REACTIVE PROTEIN: CRP: 5.1 mg/dL — ABNORMAL HIGH (ref <1.0)

## 2020-05-28 MED ORDER — PROSOURCE PLUS PO LIQD
30.0000 mL | Freq: Three times a day (TID) | ORAL | Status: DC
  Administered 2020-05-29 – 2020-06-01 (×8): 30 mL via ORAL
  Filled 2020-05-28 (×8): qty 30

## 2020-05-28 NOTE — Progress Notes (Addendum)
Nutrition Follow-up  DOCUMENTATION CODES:   Severe malnutrition in context of chronic illness  INTERVENTION:   Tube feeding stopped and Cortrak pulled per MD. Patient unlikely to meet nutrition needs with PO intake alone. Recommend reassessment of long term feeding tube.   Continue Ensure Enlive po TID, each supplement provides 350 kcal and 20 grams of protein Continue 30 ml ProSource Plus TID, each supplement provides 100 kcals and 15 grams protein.   NUTRITION DIAGNOSIS:   Severe Malnutrition related to chronic illness as evidenced by moderate fat depletion, severe muscle depletion, percent weight loss.  Ongoing  GOAL:   Patient will meet greater than or equal to 90% of their needs  Not meeting  MONITOR:   PO intake, Supplement acceptance, Labs, Weight trends, Skin  REASON FOR ASSESSMENT:   Consult Assessment of nutrition requirement/status  ASSESSMENT:   59 year old female who presented on 7/30 with acute worsening of right foot wound. PMH of anemia, ESRD s/p failed kidney transplant in 2013 and repeat transplatnt in September 2020 with current CKD stage IV, T2DM, HLD, PAD, bariatric surgery (gastric sleeve). Pt admitted with sepsis.   8/5- s/p R BKA 8/13- first calorie count- pt meeting 25% of needs PO 8/16- second calorie count- pt meeting 17-18% of needs PO 8/18- TF held 8/25- Cortrak removed  Appetite remains poor despite appetite stimulants and Cortrak being removed. Last three meal completions charted as 0%, 10%, 25%. Taking sips of Ensure. RD consulted for third calorie count. Do no suspect improvement in appetite as pt is day 27 of not meeting nutrition needs. Pt will likely require long term nutrition support if this remains in [place]. Pt is a poor candidate for PEG per surgery, consider J-tube? If not, may need further palliative involvement.   Admission weight: 70.1 kg  Current weight: 49.4 kg   Medications: aranesp, folic acid, SS novolog, marinol,  remeron, MVI, prednisone, senokot, thiamine, vitamin C 250 mg BID, zinc sulfate  Labs: Phosphorus 5.5 (H) Mg 2.8 (H) CBG 102-168  Diet Order: DYS 3, thin liquids   EDUCATION NEEDS:   Not appropriate for education at this time  Skin:  Skin Assessment: Skin Integrity Issues: Skin Integrity Issues:: Other (Comment), Unstageable Unstageable: left heel Incision: R leg Other: MASD- groin, buttocks, thigh  Last BM:  8/26  Height:   Ht Readings from Last 1 Encounters:  05/11/20 5\' 9"  (1.753 m)    Weight:   Wt Readings from Last 1 Encounters:  05/28/20 49.4 kg    Ideal Body Weight:  65.9 kg  BMI:  Body mass index is 16.1 kg/m.  Estimated Nutritional Needs:   Kcal:  2100-2400 kcals  Protein:  110-130 grams  Fluid:  >/= 2.0 L   Mariana Single RD, LDN Clinical Nutrition Pager listed in [place]

## 2020-05-28 NOTE — Plan of Care (Signed)
  Problem: Activity: Goal: Risk for activity intolerance will decrease Outcome: Not Progressing   

## 2020-05-28 NOTE — Progress Notes (Signed)
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  Patient was initially started on broad-spectrum antibiotics and orthopedics was consulted and patient underwent BKA.  Due to prolonged poor oral intake, patient was started on tube feedings via core track.  Patient has been evaluated by palliative care and patient family wish to continue aggressive measures.  Hospital course was complicated by fever and Klebsiella bacteremia which is the source has remained unclear. Chest x-ray does not show any infection. MRI of the foot does not show osteomyelitis. A-line was removed on 05/17/2020. She was started empirically on IV cefepime. ID was consulted who recommended to change her antibiotics to IV meropenem. A TTE showed possible mitral valve vegetation, TEE was ordered but patient became hypotensive prior to procedure in the TEE was unable to be completed. ID recommended at least 6 weeks of empiric antibiotics.  Meanwhile general surgery was consulted for G-tube placement due to her significant poor oral intake and deconditioning, IR was unable to place PEG tube due to her gastric sleeve. General surgery recommended to decrease her tube feedings and encourage oral intake.   Assessment & Plan:   Principal Problem:   Septic shock ([place]) Active Problems:   DM (diabetes mellitus), type 2 with renal complications ([place])   Essential hypertension   CHRONIC KIDNEY DISEASE STAGE IV (SEVERE)   Renal transplant disorder   Depression   History of kidney  transplant   Wound infection   Leukocytosis   Pressure injury of skin   Palliative care by specialist   Goals of care, counseling/discussion   DNR (do not resuscitate) discussion   Failure to thrive in adult   Pain of foot   Bacteremia due to Klebsiella pneumoniae   Septic shock secondary to right foot ulcer/cellulitis; POA Klebsiella septicemia X-ray foot negative for osteomyelitis.  MRI also with no evidence of osteomyelitis but showed a superficial phlegmon posterior calcaneus consistent with gangrene.  Orthopedics was consulted and patient underwent right BKA by Dr. Doran Durand on 05/07/2020.  Blood cultures positive for Klebsiella.  TTE showed possible mitral vegetation.  Patient was scheduled for TEE but became hypotensive and was deemed unsafe which was aborted.  Seen by infectious disease and recommended to continue empiric antibiotics for 6 weeks with meropenem with start date of 05/16/2020.  Paroxysmal atrial fibrillation --Continue amiodarone 200 mg p.o. daily --Continue Eliquis for anticoagulation  Hypotension: Resolved --Continue to monitor blood pressure closely  Chronic kidney disease stage IV s/p transplant Baseline creatinine 2.74.  History of failed renal transplant 2013 with retransplant 2020. --Creatinine 2.39 today --Continue immunosuppressants with tacrolimus 1.5 mg p.o. twice daily prednisone 5 mg p.o. daily --Avoid nephrotoxins, renally dose all medications --Monitor renal function closely daily  Hyperkalemia with peaked T waves: Resolved Nephrology was consulted, resolved with [facility] treatment. --Potassium 3.6 today --Continue monitor renal function/electrolytes daily  Type 2 diabetes mellitus with diabetic neuropathy Hemoglobin A1c 4.1, well controlled. --Insulin sliding scale for coverage  GERD: Continue Protonix 40 mg p.o. daily  Adult failure to thrive Severe protein calorie malnutrition Body mass index is 16.1 kg/m. Nutrition Status: Nutrition  Problem: Severe Malnutrition  Etiology: chronic illness Signs/Symptoms: moderate fat depletion, severe muscle depletion, percent weight loss Interventions: Prostat, Ensure Enlive (each supplement provides 350kcal and 20 grams of protein) Core track was placed and tube feeds were initially started.  IR unable to place PEG tube due to her gastric sleeve. General surgery was consulted for possible assistance of feeding tube placement but recommended to decrease her tube feedings encourage oral intake.  Patient complaining that her core track tube is inhibiting her oral intake and wishes to discontinue. --DC core track tube 8/25 --Continue to encourage increased oral intake --Dietitian consultation for calorie count --Marinol 5 mg p.o. twice daily and Remeron 7.5 mg p.o. nightly --If oral intake does not increase over the next 24-40 hours, will need reconsideration of PEG tube placement by general surgery   Pressure Injury 05/01/20 Heel Left Unstageable - Full thickness tissue loss in which the base of the injury is covered by slough (yellow, tan, gray, green or brown) and/or eschar (tan, brown or black) in the wound bed. dark discoloration unstageable bugg (Active)  05/01/20 2330  Location: Heel  Location Orientation: Left  Staging: Unstageable - Full thickness tissue loss in which the base of the injury is covered by slough (yellow, tan, gray, green or brown) and/or eschar (tan, brown or black) in the wound bed.  Wound Description (Comments): dark discoloration unstageable buggy  Present on Admission: Yes     DVT prophylaxis: Eliquis Code Status: Full code Family Communication: No family present at bedside this morning  Disposition Plan:  Status is: Inpatient  Remains inpatient appropriate because:Ongoing diagnostic testing needed not appropriate for outpatient work up, Unsafe d/c plan, IV treatments appropriate due to intensity of illness or inability to take PO and Inpatient level of care  appropriate due to severity of illness needs SNF placement but needs to discontinue core track tube for able to transfer.  Needs to increase her oral intake due to her severe protein calorie malnutrition.  Has been seen by palliative care but wants to continue aggressive measures at this time.  If no significant improvement may need to reconsider PEG tube versus more palliative approach.   Dispo: The patient is from: Home              Anticipated d/c is to: SNF              Anticipated d/c date is: 3 days              Patient currently is not medically stable to d/c.   Consultants:   Cardiology  Infectious disease  Nephrology  Palliative care  Interventional radiology  Orthopedics  Procedures:   BKA right, Dr. Doran Durand 05/07/2020  Antimicrobials:   Meropenem 8/16>>  Cefepime 8/14 - 8/16  Vancomycin 7/30- 7/31  Zosyn 8/3 - 8/10  Ceftriaxone 7/30 - 8/3  Subjective: Patient seen and examined bedside, resting comfortably.  Continues with profound weakness and fatigue.  Core track tube removed yesterday.  Per nursing continues with very little oral intake.  Discussed with patient needs of increased oral intake otherwise will continue to deteriorate and become severely deconditioned and may need PEG tube placement.  No other complaints or concerns at this time.  Denies headache, no fever/chills/night sweats, no nausea vomiting/diarrhea, no chest pain, no palpitations, no shortness of breath, no abdominal pain.  No acute events overnight per nursing staff.  Objective: Vitals:   05/27/20 2344 05/28/20 0454 05/28/20 0800 05/28/20 1155  BP: (!) 132/51 (!) 124/51 (!) 142/58 112/67  Pulse: 60 97 97 99  Resp: 17 17 17 17   Temp: 98.2 F (36.8 C) 98.2 F (36.8 C) 97.7 F (36.5 C) (!) 97.4 F (36.3 C)  TempSrc: Oral Oral Oral Axillary  SpO2: 96% 100%  100%  Weight:  49.4 kg    Height:        Intake/Output Summary (Last 24 hours) at 05/28/2020 1245 Last data filed at 05/27/2020  2346 Gross per 24 hour  Intake 400 ml  Output --  Net 400 ml   Filed Weights   05/26/20 0700 05/27/20 0433 05/28/20 0454  Weight: 72.1 kg 69.4 kg 49.4 kg    Examination:  General exam: Appears calm and comfortable, cachectic in appearance, chronically ill in appearance Respiratory system: Clear to auscultation. Respiratory effort normal.  Oxygen well on room air Cardiovascular system: S1 & S2 heard, RRR. No JVD, murmurs, rubs, gallops or clicks. No pedal edema. Gastrointestinal system: Abdomen is nondistended, soft and nontender. No organomegaly or masses felt. Normal bowel sounds heard. Central nervous system: Alert and oriented. No focal neurological deficits. Extremities: Symmetric 5 x 5 power. Skin: No rashes, lesions or ulcers Psychiatry: Judgement and insight appear poor. Mood & affect appropriate.     Data Reviewed: I have personally reviewed following labs and imaging studies  CBC: Recent Labs  Lab 05/24/20 0157 05/25/20 0649 05/26/20 0350 05/27/20 0327 05/28/20 0339  WBC 6.4 6.5 7.0 7.0 7.4  NEUTROABS 3.5 3.8 4.1 4.1 4.4  HGB 9.7* 10.7* 9.7* 10.2* 10.4*  HCT 31.5* 34.3* 32.1* 33.8* 35.0*  MCV 96.3 97.4 96.7 97.7 99.4  PLT 563* 609* 545* 490* 809*   Basic Metabolic Panel: Recent Labs  Lab 05/24/20 0157 05/25/20 0649 05/26/20 0350 05/27/20 0327 05/28/20 0339  NA 141 141 143 145 142  K 3.6 3.3* 3.5 3.6 4.2  CL 107 108 112* 113* 112*  CO2 20* 20* 19* 19* 15*  GLUCOSE 93 114* 110* 129* 85  BUN 98* 99* 93* 96* 88*  CREATININE 2.49* 2.58* 2.40* 2.39* 2.34*  CALCIUM 8.5* 8.4* 8.3* 8.2* 8.3*  MG 2.6* 2.7* 2.8* 2.7* 2.8*  PHOS 4.9* 5.4* 5.6* 5.7* 5.5*   GFR: Estimated Creatinine Clearance: 20.2 mL/min (A) (by C-G formula based on SCr of 2.34 mg/dL (H)). Liver Function Tests: Recent Labs  Lab 05/24/20 0157 05/25/20 0649 05/26/20 0350 05/27/20 0327 05/28/20 0339  ALBUMIN 1.4* 1.5* 1.5* 1.4* 1.5*   No results for input(s): LIPASE, AMYLASE in the  last 168 hours. No results for input(s): AMMONIA in the last 168 hours. Coagulation Profile: No results for input(s): INR, PROTIME in the last 168 hours. Cardiac Enzymes: No results for input(s): CKTOTAL, CKMB, CKMBINDEX, TROPONINI in the last 168 hours. BNP (last 3 results) No results for input(s): PROBNP in the last 8760 hours. HbA1C: No results for input(s): HGBA1C in the last 72 hours. CBG: Recent Labs  Lab 05/27/20 2025 05/27/20 2342 05/28/20 0456 05/28/20 0759 05/28/20 1202  GLUCAP 115* 106* 104* 102* 117*   Lipid Profile: No results for input(s): CHOL, HDL, LDLCALC, TRIG, CHOLHDL, LDLDIRECT in the last 72 hours. Thyroid Function Tests: No results for input(s): TSH, T4TOTAL, FREET4, T3FREE, THYROIDAB in the last 72 hours. Anemia Panel: No results for input(s): VITAMINB12, FOLATE, FERRITIN, TIBC, IRON, RETICCTPCT in the last 72 hours. Sepsis Labs: No results for input(s): PROCALCITON, LATICACIDVEN in the last 168 hours.  No results found for this or any previous visit (from the past 240 hour(s)).       Radiology Studies: No  results found.      Scheduled Meds: . sodium chloride   Intravenous Once  . amiodarone  200 mg Oral Daily  . apixaban  5 mg Oral BID  . Chlorhexidine Gluconate Cloth  6 each Topical Daily  . darbepoetin (ARANESP) injection - NON-DIALYSIS  150 mcg Subcutaneous Q Mon-1800  . dronabinol  5 mg Oral BID AC  . feeding supplement (ENSURE ENLIVE)  237 mL Oral TID BM  . feeding supplement (PROSource TF)  90 mL Per Tube BID  . folic acid  1 mg Oral Daily  . insulin aspart  0-15 Units Subcutaneous TID WC  . insulin aspart  0-5 Units Subcutaneous QHS  . lidocaine  5 mL Intradermal Once  . mirtazapine  7.5 mg Oral QHS  . multivitamin with minerals  1 tablet Oral Daily  . nortriptyline  25 mg Oral QHS  . pantoprazole  40 mg Oral Daily  . predniSONE  5 mg Oral Q breakfast  . senna-docusate  1 tablet Oral Daily  . sodium bicarbonate  650 mg Oral  BID  . tacrolimus  1.5 mg Oral BID  . thiamine  100 mg Oral Daily  . vitamin C  250 mg Oral BID  . zinc sulfate  220 mg Oral Daily   Continuous Infusions: . sodium chloride Stopped (05/24/20 2105)  . meropenem (MERREM) IV 1 g (05/28/20 0936)     LOS: 27 days    Time spent: 39 minutes spent on chart review, discussion with nursing staff, consultants, updating family and interview/physical exam; more than 50% of that time was spent in counseling and/or coordination of care.    Rozlynn Lippold J [place] DO [facility] Available via Epic secure chat 7am-7pm After these hours, please refer to coverage provider listed on [email or web address] 05/28/2020, 12:45 PM

## 2020-05-29 LAB — RENAL FUNCTION PANEL
Albumin: 1.5 g/dL — ABNORMAL LOW (ref 3.5–5.0)
Anion gap: 10 (ref 5–15)
BUN: 80 mg/dL — ABNORMAL HIGH (ref 6–20)
CO2: 21 mmol/L — ABNORMAL LOW (ref 22–32)
Calcium: 8.5 mg/dL — ABNORMAL LOW (ref 8.9–10.3)
Chloride: 114 mmol/L — ABNORMAL HIGH (ref 98–111)
Creatinine, Ser: 2.28 mg/dL — ABNORMAL HIGH (ref 0.44–1.00)
GFR calc Af Amer: 26 mL/min — ABNORMAL LOW (ref >60)
GFR calc non Af Amer: 23 mL/min — ABNORMAL LOW (ref >60)
Glucose, Bld: 88 mg/dL (ref 70–99)
Phosphorus: 5.3 mg/dL — ABNORMAL HIGH (ref 2.5–4.6)
Potassium: 3.9 mmol/L (ref 3.5–5.1)
Sodium: 145 mmol/L (ref 135–145)

## 2020-05-29 LAB — CBC WITH DIFFERENTIAL/PLATELET
Abs Immature Granulocytes: 0.1 10*3/uL — ABNORMAL HIGH (ref 0.00–0.07)
Basophils Absolute: 0.1 10*3/uL (ref 0.0–0.1)
Basophils Relative: 1 %
Eosinophils Absolute: 0.1 10*3/uL (ref 0.0–0.5)
Eosinophils Relative: 1 %
HCT: 37.3 % (ref 36.0–46.0)
Hemoglobin: 11.3 g/dL — ABNORMAL LOW (ref 12.0–15.0)
Immature Granulocytes: 1 %
Lymphocytes Relative: 30 %
Lymphs Abs: 2.3 10*3/uL (ref 0.7–4.0)
MCH: 29.7 pg (ref 26.0–34.0)
MCHC: 30.3 g/dL (ref 30.0–36.0)
MCV: 98.2 fL (ref 80.0–100.0)
Monocytes Absolute: 0.8 10*3/uL (ref 0.1–1.0)
Monocytes Relative: 10 %
Neutro Abs: 4.4 10*3/uL (ref 1.7–7.7)
Neutrophils Relative %: 57 %
Platelets: 479 10*3/uL — ABNORMAL HIGH (ref 150–400)
RBC: 3.8 MIL/uL — ABNORMAL LOW (ref 3.87–5.11)
RDW: 17.5 % — ABNORMAL HIGH (ref 11.5–15.5)
WBC: 7.7 10*3/uL (ref 4.0–10.5)
nRBC: 0 % (ref 0.0–0.2)

## 2020-05-29 LAB — GLUCOSE, CAPILLARY
Glucose-Capillary: 115 mg/dL — ABNORMAL HIGH (ref 70–99)
Glucose-Capillary: 122 mg/dL — ABNORMAL HIGH (ref 70–99)
Glucose-Capillary: 133 mg/dL — ABNORMAL HIGH (ref 70–99)
Glucose-Capillary: 134 mg/dL — ABNORMAL HIGH (ref 70–99)
Glucose-Capillary: 90 mg/dL (ref 70–99)
Glucose-Capillary: 98 mg/dL (ref 70–99)

## 2020-05-29 LAB — MAGNESIUM: Magnesium: 2.8 mg/dL — ABNORMAL HIGH (ref 1.7–2.4)

## 2020-05-29 NOTE — TOC Progression Note (Signed)
Transition of Care [facility]) - Progression Note    Patient Details  Name: Wanda [name] MRN: [id number] Date of Birth: 07-31-1961  Transition of Care [facility]) CM/SW [place], [place] Phone Number: 05/29/2020, 9:53 AM  Clinical Narrative:     CSW re-sent SNF referrals for placement-   CSW will provide bed offers once available.   Thurmond Butts, MSW, LCSWA Clinical Social Worker'   Expected Discharge Plan: [place] Barriers to Discharge: Continued Medical Work up  Expected Discharge Plan and Services Expected Discharge Plan: [place] arrangements for the past 2 months: Single Family Home                                       Social Determinants of Health (SDOH) Interventions    Readmission Risk Interventions Readmission Risk Prevention Plan 02/06/2020  Transportation Screening Complete  PCP or Specialist Appt within 3-5 Days Complete  HRI or Home Care Consult Complete  Social Work Consult for [place] Planning/Counseling Complete  Palliative Care Screening Not Applicable  Medication Review Press photographer) Complete  Some recent data might be hidden

## 2020-05-29 NOTE — Progress Notes (Signed)
PT Cancellation Note  Patient Details Name: Wanda [name] MRN: [id number] DOB: [birth year]   Cancelled Treatment:    Reason Eval/Treat Not Completed: Patient declined, no reason specified Pt declined mobility today due to not feeling well with an upset stomach. Will follow.   Marguarite Arbour A Dionisios Ricci 05/29/2020, 2:06 PM [name], PT, DPT Acute Rehabilitation Services Pager [phone] Office [phone]

## 2020-05-29 NOTE — Progress Notes (Signed)
Palliative Medicine RN Note: Our team has been following Wanda [name]'s case, and we discussed her in team rounds.  At this time, her goals are very clear for aggressive care. If Wanda [name] or her family wish to discuss a change in course, or if there is a significant health change, please re-consult our team. As there is not a role for [place], PMT will sign off at this time.  Marjie Skiff Islah Eve, RN, BSN, [facility] Palliative Medicine Team 05/29/2020 9:28 AM Office [phone]

## 2020-05-29 NOTE — Plan of Care (Signed)
  Problem: Activity: Goal: Risk for activity intolerance will decrease Outcome: Not Progressing   

## 2020-05-29 NOTE — Progress Notes (Signed)
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  Patient was initially started on broad-spectrum antibiotics and orthopedics was consulted and patient underwent BKA.  Due to prolonged poor oral intake, patient was started on tube feedings via core track.  Patient has been evaluated by palliative care and patient family wish to continue aggressive measures.  Hospital course was complicated by fever and Klebsiella bacteremia which the source has remained unclear. Chest x-ray does not show any infection. MRI of the footdoes not show osteomyelitis. A-line was removed on 05/17/2020. She was started empirically on IV cefepime. ID was consulted who recommended to change her antibiotics to IV meropenem. A TTEshowed possible mitral valve vegetation, TEE was ordered but patient became hypotensive prior to procedure in the TEE was unable to be completed. ID recommended at least 6 weeks of empiric antibiotics.  Meanwhile, her oral intake had remained poor so there was discussion about a J-tube placement by surgery as IR could not place a PEG tube with her gastric sleeve.  They recommended removal of core track and feeding and patient has started feeding now.   Assessment & Plan:   Principal Problem:   Septic shock ([place]) Active Problems:   DM (diabetes mellitus), type 2 with renal complications ([facility])   Essential hypertension   CHRONIC KIDNEY DISEASE STAGE IV (SEVERE)   Renal transplant disorder   Depression   History of kidney transplant   Wound infection    Leukocytosis   Pressure injury of skin   Palliative care by specialist   Goals of care, counseling/discussion   DNR (do not resuscitate) discussion   Failure to thrive in adult   Pain of foot   Bacteremia due to Klebsiella pneumoniae  Septic shock secondary to right foot ulcer/cellulitis present on admission, Klebsiella septicemia: 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  Blood culture positive for Klebsiella TTE with possible mitral vegetations.  TEE unable to be done.  Seen by infectious disease.  Recommended to continue empiric antibiotics for 6 weeks with meropenem. Meropenem, start date 8/14-until 8/25.  Has a PICC line.  Paroxysmal A. fib: Currently in sinus rhythm.  On amiodarone 200 mg daily.  On Eliquis for anticoagulation.  Hypotension: Resolved.  CKD stage IV status post transplant: Transplant 2013, transplant 2020 Creatinine at about baseline. On tacrolimus 1.5 mg twice daily and prednisone.  Close monitoring of renal functions.  Hyperkalemia: Resolved.  GERD: On Protonix.  Failure to thrive/severe protein calorie malnutrition: Nutrition Status: Nutrition Problem: Severe Malnutrition Etiology: chronic illness Signs/Symptoms: moderate fat depletion, severe muscle depletion, percent weight loss Interventions: Prostat, Ensure Enlive (each supplement provides 350kcal and 20 grams of protein)  Prolonged poor appetite.  DC core track tube on 8/25. Encourage oral feeding, frequent nutritional supplements.  Added Marinol and Remeron.  Pressure Injury 05/01/20 Heel Left Unstageable - Full thickness tissue loss in which the base of the injury is covered by slough (yellow, tan, gray, green or brown) and/or eschar (tan, brown or black) in the wound bed. dark discoloration unstageable bugg (Active)  05/01/20 2330  Location: Heel  Location Orientation: Left  Staging: Unstageable - Full thickness tissue loss in which the base of  the injury is covered by slough (yellow, tan, gray, green or brown) and/or  eschar (tan, brown or black) in the wound bed.  Wound Description (Comments): dark discoloration unstageable buggy  Present on Admission: Yes         DVT prophylaxis: SCDs Start: 05/07/20 1811 apixaban (ELIQUIS) tablet 5 mg   Code Status: Full code Family Communication: No family at bedside Disposition Plan: Status is: Inpatient  Remains inpatient appropriate because:Unsafe d/c plan   Dispo:  Patient From: Home  Planned Disposition: [place]  Expected discharge date: When bed available  medically stable for discharge: Yes         Consultants:   Orthopedics  Infectious disease  Surgery  Palliative care  IR  Cardiology  Procedures:   Below-knee amputation, 8/5  Antimicrobials:   Multiple antibiotics.  Currently on meropenem.   Subjective: Patient seen and examined.  No overnight events.  Patient says she ate good breakfast in the morning.  Objective: Vitals:   05/28/20 2344 05/29/20 0352 05/29/20 0824 05/29/20 1219  BP: 125/60 (!) 119/49 140/76 (!) 151/60  Pulse: 96 95 93 95  Resp: 17 17 14 14   Temp: 97.8 F (36.6 C) 97.7 F (36.5 C) 98.3 F (36.8 C) 98.3 F (36.8 C)  TempSrc: Oral Oral Oral Oral  SpO2: 100% 100% 100% 100%  Weight:  49.4 kg    Height:        Intake/Output Summary (Last 24 hours) at 05/29/2020 1452 Last data filed at 05/29/2020 0053 Gross per 24 hour  Intake 200 ml  Output --  Net 200 ml   Filed Weights   05/27/20 0433 05/28/20 0454 05/29/20 0352  Weight: 69.4 kg 49.4 kg 49.4 kg    Examination:  General exam: Frail and chronically sick looking lady not in any distress.  Unremarkable Respiratory system: Clear to auscultation. Respiratory effort normal. Cardiovascular system: S1 & S2 heard, RRR.  Gastrointestinal system: Abdomen is nondistended, soft and nontender. No organomegaly or masses felt. Normal bowel sounds heard. Central nervous system: Alert and oriented. No focal neurological  deficits. Right below-knee amputation stump clean and dry Left foot on offloading boots, stage II sacral ulcer.   Data Reviewed: I have personally reviewed following labs and imaging studies  CBC: Recent Labs  Lab 05/25/20 0649 05/26/20 0350 05/27/20 0327 05/28/20 0339 05/29/20 0340  WBC 6.5 7.0 7.0 7.4 7.7  NEUTROABS 3.8 4.1 4.1 4.4 4.4  HGB 10.7* 9.7* 10.2* 10.4* 11.3*  HCT 34.3* 32.1* 33.8* 35.0* 37.3  MCV 97.4 96.7 97.7 99.4 98.2  PLT 609* 545* 490* 476* 177*   Basic Metabolic Panel: Recent Labs  Lab 05/25/20 0649 05/26/20 0350 05/27/20 0327 05/28/20 0339 05/29/20 0340  NA 141 143 145 142 145  K 3.3* 3.5 3.6 4.2 3.9  CL 108 112* 113* 112* 114*  CO2 20* 19* 19* 15* 21*  GLUCOSE 114* 110* 129* 85 88  BUN 99* 93* 96* 88* 80*  CREATININE 2.58* 2.40* 2.39* 2.34* 2.28*  CALCIUM 8.4* 8.3* 8.2* 8.3* 8.5*  MG 2.7* 2.8* 2.7* 2.8* 2.8*  PHOS 5.4* 5.6* 5.7* 5.5* 5.3*   GFR: Estimated Creatinine Clearance: 20.7 mL/min (A) (by C-G formula based on SCr of 2.28 mg/dL (H)). Liver Function Tests: Recent Labs  Lab 05/25/20 0649 05/26/20 0350 05/27/20 0327 05/28/20 0339 05/29/20 0340  ALBUMIN 1.5* 1.5* 1.4* 1.5* 1.5*   No results for input(s): LIPASE, AMYLASE in the last 168 hours. No results for input(s): AMMONIA in the last 168 hours.  Coagulation Profile: No results for input(s): INR, PROTIME in the last 168 hours. Cardiac Enzymes: No results for input(s): CKTOTAL, CKMB, CKMBINDEX, TROPONINI in the last 168 hours. BNP (last 3 results) No results for input(s): PROBNP in the last 8760 hours. HbA1C: No results for input(s): HGBA1C in the last 72 hours. CBG: Recent Labs  Lab 05/28/20 2029 05/28/20 2354 05/29/20 0356 05/29/20 0831 05/29/20 1220  GLUCAP 108* 102* 90 98 122*   Lipid Profile: No results for input(s): CHOL, HDL, LDLCALC, TRIG, CHOLHDL, LDLDIRECT in the last 72 hours. Thyroid Function Tests: No results for input(s): TSH, T4TOTAL, FREET4, T3FREE,  THYROIDAB in the last 72 hours. Anemia Panel: No results for input(s): VITAMINB12, FOLATE, FERRITIN, TIBC, IRON, RETICCTPCT in the last 72 hours. Sepsis Labs: No results for input(s): PROCALCITON, LATICACIDVEN in the last 168 hours.  No results found for this or any previous visit (from the past 240 hour(s)).       Radiology Studies: No results found.      Scheduled Meds: . (feeding supplement) PROSource Plus  30 mL Oral TID BM  . sodium chloride   Intravenous Once  . amiodarone  200 mg Oral Daily  . apixaban  5 mg Oral BID  . Chlorhexidine Gluconate Cloth  6 each Topical Daily  . darbepoetin (ARANESP) injection - NON-DIALYSIS  150 mcg Subcutaneous Q Mon-1800  . dronabinol  5 mg Oral BID AC  . feeding supplement (ENSURE ENLIVE)  237 mL Oral TID BM  . folic acid  1 mg Oral Daily  . insulin aspart  0-15 Units Subcutaneous TID WC  . insulin aspart  0-5 Units Subcutaneous QHS  . lidocaine  5 mL Intradermal Once  . mirtazapine  7.5 mg Oral QHS  . multivitamin with minerals  1 tablet Oral Daily  . nortriptyline  25 mg Oral QHS  . pantoprazole  40 mg Oral Daily  . predniSONE  5 mg Oral Q breakfast  . senna-docusate  1 tablet Oral Daily  . sodium bicarbonate  650 mg Oral BID  . tacrolimus  1.5 mg Oral BID  . thiamine  100 mg Oral Daily  . vitamin C  250 mg Oral BID  . zinc sulfate  220 mg Oral Daily   Continuous Infusions: . sodium chloride Stopped (05/24/20 2105)  . meropenem (MERREM) IV 1 g (05/29/20 1019)     LOS: 28 days    Time spent: 30 minutes    Barb Merino, MD [facility] Pager [phone]

## 2020-05-30 LAB — RENAL FUNCTION PANEL
Albumin: 1.5 g/dL — ABNORMAL LOW (ref 3.5–5.0)
Anion gap: 14 (ref 5–15)
BUN: 77 mg/dL — ABNORMAL HIGH (ref 6–20)
CO2: 19 mmol/L — ABNORMAL LOW (ref 22–32)
Calcium: 8.4 mg/dL — ABNORMAL LOW (ref 8.9–10.3)
Chloride: 112 mmol/L — ABNORMAL HIGH (ref 98–111)
Creatinine, Ser: 2.29 mg/dL — ABNORMAL HIGH (ref 0.44–1.00)
GFR calc Af Amer: 26 mL/min — ABNORMAL LOW (ref >60)
GFR calc non Af Amer: 23 mL/min — ABNORMAL LOW (ref >60)
Glucose, Bld: 116 mg/dL — ABNORMAL HIGH (ref 70–99)
Phosphorus: 5.2 mg/dL — ABNORMAL HIGH (ref 2.5–4.6)
Potassium: 4.1 mmol/L (ref 3.5–5.1)
Sodium: 145 mmol/L (ref 135–145)

## 2020-05-30 LAB — CBC WITH DIFFERENTIAL/PLATELET
Abs Immature Granulocytes: 0.11 10*3/uL — ABNORMAL HIGH (ref 0.00–0.07)
Basophils Absolute: 0.1 10*3/uL (ref 0.0–0.1)
Basophils Relative: 1 %
Eosinophils Absolute: 0.1 10*3/uL (ref 0.0–0.5)
Eosinophils Relative: 1 %
HCT: 38.3 % (ref 36.0–46.0)
Hemoglobin: 11 g/dL — ABNORMAL LOW (ref 12.0–15.0)
Immature Granulocytes: 1 %
Lymphocytes Relative: 28 %
Lymphs Abs: 2.2 10*3/uL (ref 0.7–4.0)
MCH: 28.9 pg (ref 26.0–34.0)
MCHC: 28.7 g/dL — ABNORMAL LOW (ref 30.0–36.0)
MCV: 100.5 fL — ABNORMAL HIGH (ref 80.0–100.0)
Monocytes Absolute: 0.8 10*3/uL (ref 0.1–1.0)
Monocytes Relative: 10 %
Neutro Abs: 4.5 10*3/uL (ref 1.7–7.7)
Neutrophils Relative %: 59 %
Platelets: 352 10*3/uL (ref 150–400)
RBC: 3.81 MIL/uL — ABNORMAL LOW (ref 3.87–5.11)
RDW: 17.4 % — ABNORMAL HIGH (ref 11.5–15.5)
WBC: 7.7 10*3/uL (ref 4.0–10.5)
nRBC: 0 % (ref 0.0–0.2)

## 2020-05-30 LAB — GLUCOSE, CAPILLARY
Glucose-Capillary: 93 mg/dL (ref 70–99)
Glucose-Capillary: 96 mg/dL (ref 70–99)
Glucose-Capillary: 113 mg/dL — ABNORMAL HIGH (ref 70–99)
Glucose-Capillary: 122 mg/dL — ABNORMAL HIGH (ref 70–99)
Glucose-Capillary: 135 mg/dL — ABNORMAL HIGH (ref 70–99)

## 2020-05-30 LAB — MAGNESIUM: Magnesium: 2.9 mg/dL — ABNORMAL HIGH (ref 1.7–2.4)

## 2020-05-30 NOTE — Plan of Care (Signed)
  Problem: Clinical Measurements: Goal: Respiratory complications will improve Outcome: Progressing Goal: Cardiovascular complication will be avoided Outcome: Progressing   

## 2020-05-30 NOTE — Progress Notes (Signed)
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  Patient was initially started on broad-spectrum antibiotics and orthopedics was consulted and patient underwent BKA.  Due to prolonged poor oral intake, patient was started on tube feedings via core track.  Patient has been evaluated by palliative care and patient family wish to continue aggressive measures.  Hospital course was complicated by fever and Klebsiella bacteremia which the source has remained unclear. Chest x-ray does not show any infection. MRI of the footdoes not show osteomyelitis. A-line was removed on 05/17/2020. She was started empirically on IV cefepime. ID was consulted who recommended to change her antibiotics to IV meropenem. A TTEshowed possible mitral valve vegetation, TEE was ordered but patient became hypotensive prior to procedure in the TEE was unable to be completed. ID recommended at least 6 weeks of empiric antibiotics.  Meanwhile, her oral intake had remained poor so there was discussion about a J-tube placement by surgery as IR could not place a PEG tube with her gastric sleeve.  They recommended removal of core track and feeding and patient has started eating now.   Assessment & Plan:   Principal Problem:   Septic shock ([place]) Active Problems:   DM (diabetes mellitus), type 2 with renal complications (HCC)   Essential hypertension   CHRONIC KIDNEY DISEASE STAGE IV (SEVERE)   Renal transplant disorder   Depression   History of kidney transplant   Wound infection    Leukocytosis   Pressure injury of skin   Palliative care by specialist   Goals of care, counseling/discussion   DNR (do not resuscitate) discussion   Failure to thrive in adult   Pain of foot   Bacteremia due to Klebsiella pneumoniae  Septic shock secondary to right foot ulcer/cellulitis present on admission, Klebsiella septicemia: 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  Blood culture positive for Klebsiella TTE with possible mitral vegetations.  TEE unable to be done.  Seen by infectious disease.  Recommended to continue empiric antibiotics for 6 weeks with meropenem. Meropenem, start date 8/14-until 9/25.  Has a PICC line.  Paroxysmal A. fib: Currently in sinus rhythm.  On amiodarone 200 mg daily.  On Eliquis for anticoagulation.  Hypotension: Resolved.  CKD stage IV status post transplant: Transplant 2013, transplant 2020 Creatinine at about baseline. On tacrolimus 1.5 mg twice daily and prednisone.  Close monitoring of renal functions.  Hyperkalemia: Resolved.  GERD: On Protonix.  Failure to thrive/severe protein calorie malnutrition: Nutrition Status: Nutrition Problem: Severe Malnutrition Etiology: chronic illness Signs/Symptoms: moderate fat depletion, severe muscle depletion, percent weight loss Interventions: Prostat, Ensure Enlive (each supplement provides 350kcal and 20 grams of protein)  Prolonged poor appetite.  DC core track tube on 8/25. Encourage oral feeding, frequent nutritional supplements.  Added Marinol and Remeron.  Pressure Injury 05/01/20 Heel Left Unstageable - Full thickness tissue loss in which the base of the injury is covered by slough (yellow, tan, gray, green or brown) and/or eschar (tan, brown or black) in the wound bed. dark discoloration unstageable bugg (Active)  05/01/20 2330  Location: Heel  Location Orientation: Left  Staging: Unstageable - Full thickness tissue loss in which the base of  the injury is covered by slough (yellow, tan, gray, green or brown) and/or  eschar (tan, brown or black) in the wound bed.  Wound Description (Comments): dark discoloration unstageable buggy  Present on Admission: Yes     DVT prophylaxis: SCDs Start: 05/07/20 1811 apixaban (ELIQUIS) tablet 5 mg   Code Status: Full code Family Communication: husband called to update.  Disposition Plan: Status is: Inpatient  Remains inpatient appropriate because:Unsafe d/c plan   Dispo:  Patient From: Home  Planned Disposition: [place]  Expected discharge date: When bed available  medically stable for discharge: Yes   Consultants:   Orthopedics  Infectious disease  Surgery  Palliative care  IR  Cardiology  Procedures:   Below-knee amputation, 8/5  Antimicrobials:   Multiple antibiotics.  Currently on meropenem.   Subjective: Patient seen and examined.  No overnight events.  Nursing reported patient eating about less than 50% of her meals. Patient states that her husband brought her good dinner last night and she ate it all.  Her husband states that she ate mostly the cheesecake. Objective: Vitals:   05/29/20 2322 05/30/20 0341 05/30/20 0735 05/30/20 1124  BP: 102/84 (!) 139/57 134/77 (!) 150/59  Pulse: 94 95 95 94  Resp: 18 18 17 16   Temp: 98.2 F (36.8 C) 98.2 F (36.8 C) 98 F (36.7 C) 98 F (36.7 C)  TempSrc: Oral Oral Oral Oral  SpO2: 100% 100% 100% 97%  Weight:  46.3 kg    Height:        Intake/Output Summary (Last 24 hours) at 05/30/2020 1143 Last data filed at 05/30/2020 0214 Gross per 24 hour  Intake 440 ml  Output --  Net 440 ml   Filed Weights   05/28/20 0454 05/29/20 0352 05/30/20 0341  Weight: 49.4 kg 49.4 kg 46.3 kg    Examination:  General exam: Frail and chronically sick looking lady not in any distress.   Respiratory system: Clear to auscultation. Respiratory effort normal. Cardiovascular system: S1 & S2 heard, RRR.  Gastrointestinal system: Abdomen is nondistended, soft and nontender. No  organomegaly or masses felt. Normal bowel sounds heard. Central nervous system: Alert and oriented. No focal neurological deficits. Right below-knee amputation stump clean and dry Left foot on offloading boots, stage II sacral ulcer.   Data Reviewed: I have personally reviewed following labs and imaging studies  CBC: Recent Labs  Lab 05/26/20 0350 05/27/20 0327 05/28/20 0339 05/29/20 0340 05/30/20 0206  WBC 7.0 7.0 7.4 7.7 7.7  NEUTROABS 4.1 4.1 4.4 4.4 4.5  HGB 9.7* 10.2* 10.4* 11.3* 11.0*  HCT 32.1* 33.8* 35.0* 37.3 38.3  MCV 96.7 97.7 99.4 98.2 100.5*  PLT 545* 490* 476* 479* 419   Basic Metabolic Panel: Recent Labs  Lab 05/26/20 0350 05/27/20 0327 05/28/20 0339 05/29/20 0340 05/30/20 0206  NA 143 145 142 145 145  K 3.5 3.6 4.2 3.9 4.1  CL 112* 113* 112* 114* 112*  CO2 19* 19* 15* 21* 19*  GLUCOSE 110* 129* 85 88 116*  BUN 93* 96* 88* 80* 77*  CREATININE 2.40* 2.39* 2.34* 2.28* 2.29*  CALCIUM 8.3* 8.2* 8.3* 8.5* 8.4*  MG 2.8* 2.7* 2.8* 2.8* 2.9*  PHOS 5.6* 5.7* 5.5* 5.3* 5.2*   GFR: Estimated Creatinine Clearance: 19.3 mL/min (A) (by C-G formula based on SCr of 2.29 mg/dL (H)). Liver Function Tests: Recent Labs  Lab 05/26/20 0350 05/27/20 0327 05/28/20 0339 05/29/20 0340 05/30/20 0206  ALBUMIN 1.5* 1.4* 1.5* 1.5* 1.5*   No results for  input(s): LIPASE, AMYLASE in the last 168 hours. No results for input(s): AMMONIA in the last 168 hours. Coagulation Profile: No results for input(s): INR, PROTIME in the last 168 hours. Cardiac Enzymes: No results for input(s): CKTOTAL, CKMB, CKMBINDEX, TROPONINI in the last 168 hours. BNP (last 3 results) No results for input(s): PROBNP in the last 8760 hours. HbA1C: No results for input(s): HGBA1C in the last 72 hours. CBG: Recent Labs  Lab 05/29/20 2109 05/29/20 2341 05/30/20 0418 05/30/20 0733 05/30/20 1123  GLUCAP 133* 115* 93 96 113*   Lipid Profile: No results for input(s): CHOL, HDL, LDLCALC, TRIG,  CHOLHDL, LDLDIRECT in the last 72 hours. Thyroid Function Tests: No results for input(s): TSH, T4TOTAL, FREET4, T3FREE, THYROIDAB in the last 72 hours. Anemia Panel: No results for input(s): VITAMINB12, FOLATE, FERRITIN, TIBC, IRON, RETICCTPCT in the last 72 hours. Sepsis Labs: No results for input(s): PROCALCITON, LATICACIDVEN in the last 168 hours.  No results found for this or any previous visit (from the past 240 hour(s)).       Radiology Studies: No results found.      Scheduled Meds: . (feeding supplement) PROSource Plus  30 mL Oral TID BM  . sodium chloride   Intravenous Once  . amiodarone  200 mg Oral Daily  . apixaban  5 mg Oral BID  . Chlorhexidine Gluconate Cloth  6 each Topical Daily  . darbepoetin (ARANESP) injection - NON-DIALYSIS  150 mcg Subcutaneous Q Mon-1800  . dronabinol  5 mg Oral BID AC  . feeding supplement (ENSURE ENLIVE)  237 mL Oral TID BM  . folic acid  1 mg Oral Daily  . insulin aspart  0-15 Units Subcutaneous TID WC  . insulin aspart  0-5 Units Subcutaneous QHS  . lidocaine  5 mL Intradermal Once  . mirtazapine  7.5 mg Oral QHS  . multivitamin with minerals  1 tablet Oral Daily  . nortriptyline  25 mg Oral QHS  . pantoprazole  40 mg Oral Daily  . predniSONE  5 mg Oral Q breakfast  . senna-docusate  1 tablet Oral Daily  . sodium bicarbonate  650 mg Oral BID  . tacrolimus  1.5 mg Oral BID  . thiamine  100 mg Oral Daily  . vitamin C  250 mg Oral BID  . zinc sulfate  220 mg Oral Daily   Continuous Infusions: . sodium chloride Stopped (05/24/20 2105)  . meropenem (MERREM) IV 1 g (05/30/20 1049)     LOS: 29 days    Time spent: 30 minutes    Barb Merino, MD [facility] Pager [phone]

## 2020-05-31 DIAGNOSIS — T8610 Unspecified complication of kidney transplant: Secondary | ICD-10-CM

## 2020-05-31 LAB — CBC WITH DIFFERENTIAL/PLATELET
Abs Immature Granulocytes: 0.13 10*3/uL — ABNORMAL HIGH (ref 0.00–0.07)
Basophils Absolute: 0.1 10*3/uL (ref 0.0–0.1)
Basophils Relative: 1 %
Eosinophils Absolute: 0.1 10*3/uL (ref 0.0–0.5)
Eosinophils Relative: 1 %
HCT: 39.7 % (ref 36.0–46.0)
Hemoglobin: 11.7 g/dL — ABNORMAL LOW (ref 12.0–15.0)
Immature Granulocytes: 2 %
Lymphocytes Relative: 29 %
Lymphs Abs: 2.6 10*3/uL (ref 0.7–4.0)
MCH: 28.6 pg (ref 26.0–34.0)
MCHC: 29.5 g/dL — ABNORMAL LOW (ref 30.0–36.0)
MCV: 97.1 fL (ref 80.0–100.0)
Monocytes Absolute: 0.9 10*3/uL (ref 0.1–1.0)
Monocytes Relative: 10 %
Neutro Abs: 5.1 10*3/uL (ref 1.7–7.7)
Neutrophils Relative %: 57 %
Platelets: 380 10*3/uL (ref 150–400)
RBC: 4.09 MIL/uL (ref 3.87–5.11)
RDW: 16.9 % — ABNORMAL HIGH (ref 11.5–15.5)
WBC: 8.9 10*3/uL (ref 4.0–10.5)
nRBC: 0.2 % (ref 0.0–0.2)

## 2020-05-31 LAB — GLUCOSE, CAPILLARY
Glucose-Capillary: 108 mg/dL — ABNORMAL HIGH (ref 70–99)
Glucose-Capillary: 109 mg/dL — ABNORMAL HIGH (ref 70–99)
Glucose-Capillary: 147 mg/dL — ABNORMAL HIGH (ref 70–99)
Glucose-Capillary: 92 mg/dL (ref 70–99)
Glucose-Capillary: 93 mg/dL (ref 70–99)
Glucose-Capillary: 97 mg/dL (ref 70–99)

## 2020-05-31 LAB — RENAL FUNCTION PANEL
Albumin: 1.5 g/dL — ABNORMAL LOW (ref 3.5–5.0)
Anion gap: 11 (ref 5–15)
BUN: 72 mg/dL — ABNORMAL HIGH (ref 6–20)
CO2: 15 mmol/L — ABNORMAL LOW (ref 22–32)
Calcium: 8.2 mg/dL — ABNORMAL LOW (ref 8.9–10.3)
Chloride: 116 mmol/L — ABNORMAL HIGH (ref 98–111)
Creatinine, Ser: 2.19 mg/dL — ABNORMAL HIGH (ref 0.44–1.00)
GFR calc Af Amer: 28 mL/min — ABNORMAL LOW (ref >60)
GFR calc non Af Amer: 24 mL/min — ABNORMAL LOW (ref >60)
Glucose, Bld: 88 mg/dL (ref 70–99)
Phosphorus: 5.1 mg/dL — ABNORMAL HIGH (ref 2.5–4.6)
Potassium: 5.1 mmol/L (ref 3.5–5.1)
Sodium: 142 mmol/L (ref 135–145)

## 2020-05-31 LAB — MAGNESIUM: Magnesium: 2.9 mg/dL — ABNORMAL HIGH (ref 1.7–2.4)

## 2020-05-31 NOTE — Progress Notes (Addendum)
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  Patient was initially put on broad-spectrum antibiotics and orthopedics was consulted subsequently underwent below-knee amputation.  She did have a poor oral intake and was started on cortrak tube feeding and was seen by palliative care.  Family did not wish to continue aggressive measures.  Hospital course was complicated by fever and Klebsiella bacteremia which the source has remained unclear. Chest x-ray does not show any infection. MRI of the footdoes not show osteomyelitis. A-line was removed on 05/17/2020. She was started empirically on IV cefepime. ID was consulted who recommended to change her antibiotics to IV meropenem. A TTEshowed possible mitral valve vegetation, TEE was ordered but patient became hypotensive prior to procedure in the TEE was unable to be completed. ID recommended at least 6 weeks of empiric antibiotics. Meanwhile, her oral intake had remained poor so there was discussion about a J-tube placement by surgery as IR could not place a PEG tube with her gastric sleeve.  They recommended removal of core track and feeding and patient has started eating now.  Assessment/Plan:  Principal Problem:   Septic shock (HCC) Active Problems:   DM (diabetes mellitus), type 2 with renal complications (HCC)   Essential hypertension   CHRONIC KIDNEY DISEASE STAGE IV (SEVERE)   Renal transplant disorder   Depression   History of kidney transplant   Wound infection    Leukocytosis   Pressure injury of skin   Palliative care by specialist   Goals of care, counseling/discussion   DNR (do not resuscitate) discussion   Failure to thrive in adult   Pain of foot   Bacteremia due to Klebsiella pneumoniae   Septic shock secondary to right foot ulcer/cellulitis present on admission, Klebsiella septicemia: Patient was initially put on antibiotics and subsequently underwent right below-knee amputation on 05/07/2020.  Blood culture was positive for Klebsiella with vegetations in the mitral valve.  Infectious disease on board and recommend a 6 weeks of antibiotic with meropenem.  End date of antibiotic 06/27/2020.    Paroxysmal A. fib: Currently in sinus rhythm on amiodarone and Eliquis.  Continue to monitor.   Hypotension: Resolved.  CKD stage IV status post transplant: Transplant 2013, transplant 2020. Creatinine at about baseline, at 2.1 today.  Continue tacrolimus 1.5 mg twice daily and prednisone.  Close monitoring of renal functions.  Hyperkalemia: Resolved.  Potassium 5.1 today.  We will continue to monitor.  GERD: On Protonix.  Failure to thrive/severe protein calorie malnutrition: Dietary on board.  On Marinol and Remeron.  Cortrak tube was discontinued on 05/27/2020.  Encourage oral feeding.  Left heel unstageable ulcer.  Present on admission continue wound care. Pressure Injury 05/01/20 Heel Left Unstageable - Full thickness tissue loss in which the base of the injury is covered by slough (yellow, tan, gray, green or brown) and/or eschar (tan, brown or black) in the wound bed. dark discoloration unstageable bugg (Active)  05/01/20 2330  Location: Heel  Location Orientation: Left  Staging: Unstageable - Full thickness tissue loss in which the base of the injury is covered by slough (yellow, tan, gray, green  or brown) and/or eschar (tan, brown or black) in the wound bed.  Wound Description (Comments): dark discoloration unstageable buggy  Present on  Admission: Yes     DVT prophylaxis: SCDs Start: 05/07/20 1811 apixaban (ELIQUIS) tablet 5 mg    Code Status: Full code  Family Communication: None today  Status is: Inpatient  Remains inpatient appropriate because:Unsafe for DC plan.  Awaiting for skilled nursing placement  Dispo:  Patient From: Home  Planned Disposition: [place] as per PT recommendations  Expected discharge date: 06/02/20  Medically stable for discharge: yes  Consultants:  Orthopedics  Infectious disease  Surgery  Palliative care  IR  Cardiology  Procedures:  Below-knee amputation, 8/5  Cortrak tube placement and removal  Antibiotics:  . Meropenem   Subjective: Today, patient was seen and examined at bedside.  Patient denies any pain, fever, chills or rigor.  Has been trying to eat better.  Denies any nausea vomiting  Objective: Vitals:   05/31/20 0338 05/31/20 0803  BP: (!) 143/61 (!) 146/48  Pulse: 72 95  Resp: 17 17  Temp: 98 F (36.7 C) 97.8 F (36.6 C)  SpO2: 99% 100%   No intake or output data in the 24 hours ending 05/31/20 0934 Filed Weights   05/29/20 0352 05/30/20 0341 05/31/20 0338  Weight: 49.4 kg 46.3 kg 46.3 kg   Body mass index is 15.06 kg/m.   Physical Exam: GENERAL: Patient is alert awake and oriented. Not in obvious distress. HENT: No scleral pallor or icterus. Pupils equally reactive to light. Oral mucosa is moist NECK: is supple, no gross swelling noted. CHEST: Clear to auscultation. No crackles or wheezes.  Diminished breath sounds bilaterally. CVS: S1 and S2 heard, no murmur. Regular rate and rhythm.  ABDOMEN: Soft, non-tender, bowel sounds are present. EXTREMITIES: Right below-knee amputation stump covered with dressing.  Left foot on boots, CNS: Cranial nerves are intact. No focal motor deficits. SKIN: warm and dry, left heel unstageable ulcer.  Data Review: I have personally reviewed the following laboratory data and  studies,  CBC: Recent Labs  Lab 05/27/20 0327 05/28/20 0339 05/29/20 0340 05/30/20 0206 05/31/20 0354  WBC 7.0 7.4 7.7 7.7 8.9  NEUTROABS 4.1 4.4 4.4 4.5 5.1  HGB 10.2* 10.4* 11.3* 11.0* 11.7*  HCT 33.8* 35.0* 37.3 38.3 39.7  MCV 97.7 99.4 98.2 100.5* 97.1  PLT 490* 476* 479* 352 544   Basic Metabolic Panel: Recent Labs  Lab 05/27/20 0327 05/28/20 0339 05/29/20 0340 05/30/20 0206 05/31/20 0354  NA 145 142 145 145 142  K 3.6 4.2 3.9 4.1 5.1  CL 113* 112* 114* 112* 116*  CO2 19* 15* 21* 19* 15*  GLUCOSE 129* 85 88 116* 88  BUN 96* 88* 80* 77* 72*  CREATININE 2.39* 2.34* 2.28* 2.29* 2.19*  CALCIUM 8.2* 8.3* 8.5* 8.4* 8.2*  MG 2.7* 2.8* 2.8* 2.9* 2.9*  PHOS 5.7* 5.5* 5.3* 5.2* 5.1*   Liver Function Tests: Recent Labs  Lab 05/27/20 0327 05/28/20 0339 05/29/20 0340 05/30/20 0206 05/31/20 0354  ALBUMIN 1.4* 1.5* 1.5* 1.5* 1.5*   No results for input(s): LIPASE, AMYLASE in the last 168 hours. No results for input(s): AMMONIA in the last 168 hours. Cardiac Enzymes: No results for input(s): CKTOTAL, CKMB, CKMBINDEX, TROPONINI in the last 168 hours. BNP (last 3 results) Recent Labs    02/04/20 2345  BNP 373.6*    ProBNP (last 3 results) No results for input(s): PROBNP in the last 8760 hours.  CBG: Recent Labs  Lab 05/30/20 1627 05/30/20 2031 05/31/20 0034 05/31/20 0411 05/31/20 0806  GLUCAP 122* 135* 97 93 92   No results found for this or any previous visit (from the past 240 hour(s)).   Studies: No results found.    Flora Lipps, MD  [facility] 05/31/2020

## 2020-05-31 NOTE — TOC Progression Note (Signed)
Transition of Care [facility]) - Progression Note    Patient Details  Name: Wanda [name] MRN: [id number] Date of Birth: [birth year]  Transition of Care [facility]) CM/SW [place], [place] Phone Number: 05/31/2020, 11:08 AM  Clinical Narrative:     Patient has no confirmed bed offers. [facility] [place]- they will review and call CSW back.  Thurmond Butts, MSW, [place] Clinical Social Worker   Expected Discharge Plan: Skilled Nursing Facility Barriers to Discharge: Continued Medical Work up  Expected Discharge Plan and Services Expected Discharge Plan: [place] arrangements for the past 2 months: Single Family Home                                       Social Determinants of Health (SDOH) Interventions    Readmission Risk Interventions Readmission Risk Prevention Plan 02/06/2020  Transportation Screening Complete  PCP or Specialist Appt within 3-5 Days Complete  HRI or Home Care Consult Complete  Social Work Consult for [place] Planning/Counseling Complete  Palliative Care Screening Not Applicable  Medication Review Press photographer) Complete  Some recent data might be hidden

## 2020-05-31 NOTE — TOC Progression Note (Signed)
Transition of Care [facility]) - Progression Note    Patient Details  Name: Wanda [name] MRN: [id number] Date of Birth: [birth year]  Transition of Care [facility]) CM/SW [place], [place] Phone Number: 05/31/2020, 2:55 PM  Clinical Narrative:     CSW met with patient at bedside. CSW gave bed offer. Patient accepted bed offer with [facility].   CSW contacted [facility] - they confirmed bed offer.   CSW updated RN- covid test is needed.  Thurmond Butts, MSW, [place] Clinical Social Worker    Expected Discharge Plan: Skilled Nursing Facility Barriers to Discharge: Continued Medical Work up  Expected Discharge Plan and Services Expected Discharge Plan: [place] arrangements for the past 2 months: Single Family Home                                       Social Determinants of Health (SDOH) Interventions    Readmission Risk Interventions Readmission Risk Prevention Plan 02/06/2020  Transportation Screening Complete  PCP or Specialist Appt within 3-5 Days Complete  HRI or Home Care Consult Complete  Social Work Consult for [place] Planning/Counseling Complete  Palliative Care Screening Not Applicable  Medication Review Press photographer) Complete  Some recent data might be hidden

## 2020-06-01 ENCOUNTER — Inpatient Hospital Stay: Payer: Self-pay

## 2020-06-01 LAB — CBC WITH DIFFERENTIAL/PLATELET
Abs Immature Granulocytes: 0.13 10*3/uL — ABNORMAL HIGH (ref 0.00–0.07)
Basophils Absolute: 0.1 10*3/uL (ref 0.0–0.1)
Basophils Relative: 1 %
Eosinophils Absolute: 0.1 10*3/uL (ref 0.0–0.5)
Eosinophils Relative: 1 %
HCT: 39.1 % (ref 36.0–46.0)
Hemoglobin: 11.5 g/dL — ABNORMAL LOW (ref 12.0–15.0)
Immature Granulocytes: 2 %
Lymphocytes Relative: 28 %
Lymphs Abs: 2.5 10*3/uL (ref 0.7–4.0)
MCH: 29.3 pg (ref 26.0–34.0)
MCHC: 29.4 g/dL — ABNORMAL LOW (ref 30.0–36.0)
MCV: 99.7 fL (ref 80.0–100.0)
Monocytes Absolute: 0.9 10*3/uL (ref 0.1–1.0)
Monocytes Relative: 10 %
Neutro Abs: 5.2 10*3/uL (ref 1.7–7.7)
Neutrophils Relative %: 58 %
Platelets: 329 10*3/uL (ref 150–400)
RBC: 3.92 MIL/uL (ref 3.87–5.11)
RDW: 16.8 % — ABNORMAL HIGH (ref 11.5–15.5)
WBC: 8.9 10*3/uL (ref 4.0–10.5)
nRBC: 0 % (ref 0.0–0.2)

## 2020-06-01 LAB — RENAL FUNCTION PANEL
Albumin: 1.4 g/dL — ABNORMAL LOW (ref 3.5–5.0)
Anion gap: 12 (ref 5–15)
BUN: 67 mg/dL — ABNORMAL HIGH (ref 6–20)
CO2: 19 mmol/L — ABNORMAL LOW (ref 22–32)
Calcium: 8.3 mg/dL — ABNORMAL LOW (ref 8.9–10.3)
Chloride: 114 mmol/L — ABNORMAL HIGH (ref 98–111)
Creatinine, Ser: 2.41 mg/dL — ABNORMAL HIGH (ref 0.44–1.00)
GFR calc Af Amer: 25 mL/min — ABNORMAL LOW (ref >60)
GFR calc non Af Amer: 21 mL/min — ABNORMAL LOW (ref >60)
Glucose, Bld: 101 mg/dL — ABNORMAL HIGH (ref 70–99)
Phosphorus: 5.1 mg/dL — ABNORMAL HIGH (ref 2.5–4.6)
Potassium: 4.5 mmol/L (ref 3.5–5.1)
Sodium: 145 mmol/L (ref 135–145)

## 2020-06-01 LAB — GLUCOSE, CAPILLARY
Glucose-Capillary: 102 mg/dL — ABNORMAL HIGH (ref 70–99)
Glucose-Capillary: 107 mg/dL — ABNORMAL HIGH (ref 70–99)
Glucose-Capillary: 111 mg/dL — ABNORMAL HIGH (ref 70–99)
Glucose-Capillary: 119 mg/dL — ABNORMAL HIGH (ref 70–99)
Glucose-Capillary: 94 mg/dL (ref 70–99)

## 2020-06-01 LAB — MAGNESIUM: Magnesium: 2.8 mg/dL — ABNORMAL HIGH (ref 1.7–2.4)

## 2020-06-01 LAB — SARS CORONAVIRUS 2 (TAT 6-24 HRS): SARS Coronavirus 2: NEGATIVE

## 2020-06-01 MED ORDER — ONDANSETRON HCL 4 MG PO TABS: 4.0000 mg | ORAL_TABLET | Freq: Four times a day (QID) | ORAL | 0 refills | Status: AC | PRN

## 2020-06-01 MED ORDER — GERHARDT'S BUTT CREAM: 1.0000 "application " | TOPICAL_CREAM | CUTANEOUS | Status: DC | PRN

## 2020-06-01 MED ORDER — SODIUM BICARBONATE 650 MG PO TABS: 650.0000 mg | ORAL_TABLET | Freq: Two times a day (BID) | ORAL | 0 refills | Status: AC

## 2020-06-01 MED ORDER — ZINC SULFATE 220 (50 ZN) MG PO CAPS: 220.0000 mg | ORAL_CAPSULE | Freq: Every day | ORAL | Status: AC

## 2020-06-01 MED ORDER — ASCORBIC ACID 250 MG PO TABS: 250.0000 mg | ORAL_TABLET | Freq: Two times a day (BID) | ORAL | Status: AC

## 2020-06-01 MED ORDER — OXYCODONE HCL 5 MG PO TABS
5.0000 mg | ORAL_TABLET | Freq: Four times a day (QID) | ORAL | 0 refills | Status: DC | PRN
Start: 2020-06-01 — End: 2020-07-07

## 2020-06-01 MED ORDER — PREDNISONE 5 MG PO TABS: 5.0000 mg | ORAL_TABLET | Freq: Every day | ORAL | Status: AC

## 2020-06-01 MED ORDER — SODIUM CHLORIDE 0.9 % IV SOLN: 1.0000 g | Freq: Two times a day (BID) | INTRAVENOUS | Status: DC

## 2020-06-01 MED ORDER — NAPHAZOLINE-GLYCERIN 0.012-0.2 % OP SOLN: 1.0000 [drp] | Freq: Four times a day (QID) | OPHTHALMIC | 0 refills | Status: AC | PRN

## 2020-06-01 MED ORDER — MIRTAZAPINE 7.5 MG PO TABS: 7.5000 mg | ORAL_TABLET | Freq: Every day | ORAL | Status: AC

## 2020-06-01 MED ORDER — SENNOSIDES-DOCUSATE SODIUM 8.6-50 MG PO TABS: 1.0000 | ORAL_TABLET | Freq: Every day | ORAL | Status: AC

## 2020-06-01 MED ORDER — SODIUM CHLORIDE 0.9% FLUSH: 10.0000 mL | INTRAVENOUS | Status: DC | PRN

## 2020-06-01 NOTE — Discharge Summary (Addendum)
Physician Discharge Summary  Wanda [name] YSA:[id number] DOB: 1961/08/23 DOA: 05/01/2020  PCP: Lorene Dy, MD  Admit date: 05/01/2020 Discharge date: 06/01/2020  Admitted From: Home  Discharge disposition: SNF  Recommendations for Outpatient Follow-Up:    Follow up with your primary care provider at the skilled nursing facility in 3 to 5 days.  Continue antibiotics, End date of antibiotic 06/27/2020.  Check labs as ordered  Follow-up with infectious disease clinic as has been scheduled.    Follow-up with orthopedics status post amputation, please reschedule for an appointment in 2 weeks  Follow-up with nephrology/renal transplant team as scheduled by the clinic.  Patient is on dysphagia 3 diet.  Advance as tolerated.  Patient will need encouragement on oral diet.  Discharge Diagnosis:   Principal Problem:   Septic shock ([place]) Active Problems:   DM (diabetes mellitus), type 2 with renal complications (HCC)   Essential hypertension   CHRONIC KIDNEY DISEASE STAGE IV (SEVERE)   Renal transplant disorder   Depression   History of kidney transplant   Wound infection   Leukocytosis   Pressure injury of skin   Palliative care by specialist   Goals of care, counseling/discussion   DNR (do not resuscitate) discussion   Failure to thrive in adult   Pain of foot   Bacteremia due to Klebsiella pneumoniae  Discharge Condition: Improved.  Diet recommendation: Dysphagia 3 diet.  Diabetic diet.  Wound care: Continue dressing to the left foot, stump care.  Code status: Full.   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  Patient  was initially put on broad-spectrum antibiotics and orthopedics was consulted subsequently underwent below-knee amputation.  She did have a poor oral intake and was started on cortrak tube feeding and was seen by palliative care.  Family did not wish to continue aggressive measures.  Hospital course was complicated by fever and Klebsiella bacteremia which the source has remained unclear. Chest x-ray does not show any infection. MRI of the footdoes not show osteomyelitis. A-line was removed on 05/17/2020. She was started empirically on IV cefepime. ID was consulted who recommended to change her antibiotics to IV meropenem. A TTEshowed possible mitral valve vegetation, TEE was ordered but patient became hypotensive prior to procedure in the TEE was unable to be completed. ID recommended at least 6 weeks of empiric antibiotics. Meanwhile, her oral intake had remained poor so there was discussion about a J-tube placement by surgery as IR could not place a PEG tube with her gastric sleeve. They recommended removal of core track and feeding and patient has started eating now.  Hospital Course:   Following conditions were addressed during hospitalization as listed below,  Septic shock secondary to right foot ulcer/cellulitis present on admission, Klebsiella septicemia: Patient was initially put on antibiotics and subsequently underwent right below-knee amputation on 05/07/2020 by orthopedics..  Blood culture was positive for Klebsiella with vegetations in the mitral valve.  Infectious disease on board and recommend a 6 weeks of antibiotic with meropenem.  End date of antibiotic 06/27/2020.    Paroxysmal A. fib: Currently in sinus rhythm on amiodarone and Eliquis.  Continue on discharge  Hypotension:Mild.  Asymptomatic.  CKD stage IV status post transplant: Transplant 2013, transplant 2020. Creatinine  at about baseline. Continue tacrolimus 1.5 mg twice daily and prednisone. Close monitoring of renal  functions.  Patient will need to follow-up with her nephrologist/renal transplant team.  Hyperkalemia:Resolved.  Potassium 5.1 today.  We will continue to monitor.  GERD:On Protonix.  Failure to thrive/severe protein calorie malnutrition: Seen by dietary during hospitalization.  Currently on dysphagia diet.  We will continue Remeron.  Cortrak tube was discontinued on 05/27/2020.  Encourage oral feeding.  Left heel unstageable ulcer.  Present on admission, continue wound care.  Disposition.  At this time, patient is stable for disposition to skilled nursing facility.  Patient will need to follow-up with infectious disease and orthopedics as outpatient.  I was unable to reach the patient's son on the phone today.  Medical Consultants:    Orthopedics  Infectious disease  Surgery  Palliative care  IR  Cardiology  Procedures:     Below-knee amputation, 8/5  Cortrak tube placement and removal   Subjective:   Today, patient was seen and examined at bedside.  Patient denies any nausea, vomiting fever, chills or rigor.  No interval complaints reported.  Discharge Exam:   Vitals:   06/01/20 0758 06/01/20 1119  BP: 91/75 98/81  Pulse: 96 99  Resp: 17 14  Temp: 97.6 F (36.4 C) 97.8 F (36.6 C)  SpO2: 94% 97%   Vitals:   06/01/20 0007 06/01/20 0447 06/01/20 0758 06/01/20 1119  BP: (!) 140/57 (!) 1'46/52 91/75 98/81 '  Pulse: 93 97 96 99  Resp: '18 18 17 14  ' Temp: 98.1 F (36.7 C) 98 F (36.7 C) 97.6 F (36.4 C) 97.8 F (36.6 C)  TempSrc: Oral Oral Oral Oral  SpO2: 99% 99% 94% 97%  Weight:      Height:        General: Alert awake, not in obvious distress HENT: pupils equally reacting to light,  No scleral pallor or icterus noted. Oral mucosa is moist.  Chest:  Clear breath sounds.  Diminished breath sounds bilaterally. No crackles or wheezes.  CVS: S1 &S2 heard. No murmur.  Regular rate and rhythm. Abdomen: Soft, nontender, nondistended.  Bowel sounds are  heard.   Extremities: Right below-knee amputation stump covered.  Left foot on boots Psych: Alert, awake and oriented, normal mood CNS:  No cranial nerve deficits.  Power equal in all extremities.   Skin: Warm and dry, status post right below-knee amputation, left heel unstageable ulcer  The results of significant diagnostics from this hospitalization (including imaging, microbiology, ancillary and laboratory) are listed below for reference.     Diagnostic Studies:   DG Tibia/Fibula Right  Result Date: 05/01/2020 CLINICAL DATA:  Pain.  Fever.  Possible infection. EXAM: RIGHT TIBIA AND FIBULA - 2 VIEW COMPARISON:  None. FINDINGS: Cortical margins of the tibia and fibula are intact. No periosteal reaction or bony destruction. There is no evidence of fracture or other focal bone lesions. Arterial vascular calcifications. Scattered venous phleboliths. Minimal skin thickening/soft tissue prominence about the medial aspect of the mid leg. No soft tissue air or radiopaque foreign body. IMPRESSION: 1. Minimal skin thickening/soft tissue prominence about the medial aspect of the mid leg is nonspecific. No soft tissue air or radiopaque foreign body. 2. No acute osseous abnormality. No radiographic evidence of osteomyelitis. Electronically Signed   By: Keith Rake M.D.   On: 05/01/2020 16:09   MR FOOT RIGHT WO CONTRAST  Result Date: 05/01/2020 CLINICAL DATA:  Infection fever, question of osteomyelitis EXAM: MRI OF THE RIGHT FOREFOOT WITHOUT CONTRAST  TECHNIQUE: Multiplanar, multisequence MR imaging of the right was performed. No intravenous contrast was administered. COMPARISON:  None. FINDINGS: Bones/Joint/Cartilage There is mildly increased T2 hyperintense signal seen at the posterior calcaneus. No definite T1 hypointensity seen within this area. No definite area of cortical destruction or periosteal reaction. The remainder of the osseous structures are intact. There is a small ankle and subtalar joint  effusion. Ligaments The Lisfranc ligaments are intact. Muscles and Tendons Mild diffuse fatty atrophy with increased signal seen with within the muscles surrounding the forefoot. The flexor and extensor tendons are intact. The Achilles tendon and plantar fascia are intact. Soft tissues Area superficial ulceration seen over the posterior calcaneus. There is a tiny superficial fluid collection seen overlying the lateral aspect of the posterior calcaneus measuring 1.1 cm in transverse dimension. No sinus tract or loculated fluid collection. There is extensive heel pad inflammation. IMPRESSION: Findings of superficial ulceration with extensive surrounding cellulitis and a tiny superficial 1 cm phlegmon overlying the posterior calcaneus. No definite evidence of osteomyelitis. Extensive heel pad inflammation. Small ankle joint effusion. Findings suggestive of chronic denervation atrophy. Electronically Signed   By: Prudencio Pair M.D.   On: 05/01/2020 19:45   DG Chest Port 1 View  Result Date: 05/01/2020 CLINICAL DATA:  59 year old female with possible sepsis. EXAM: PORTABLE CHEST 1 VIEW COMPARISON:  Portable chest 02/04/2020 and earlier. FINDINGS: Portable AP semi upright view at 1446 hours. Stable mild elevation of the left hemidiaphragm. Lung volumes and mediastinal contours remain within normal limits. Continued indistinct left perihilar opacity since May. Allowing for portable technique the right lung remains clear. Negative trachea. No pneumothorax or pleural effusion. Negative visible bowel gas pattern. No acute osseous abnormality identified. IMPRESSION: 1. Persistent vague left perihilar lung opacity since May. Recommend either PA and lateral chest x-ray or follow-up Chest CT to further characterize. 2. No other cardiopulmonary abnormality. Electronically Signed   By: Genevie Ann M.D.   On: 05/01/2020 15:03   DG Foot Complete Left  Result Date: 05/01/2020 CLINICAL DATA:  Infection. Fever. Technologist notes  state: Foot sores. EXAM: LEFT FOOT - COMPLETE 3+ VIEW COMPARISON:  None. FINDINGS: Mild hammertoe deformity of the digits. No fracture. No erosion, periosteal reaction, or evidence of bony destruction. Small Achilles tendon enthesophyte. Skin irregularity about the posterior heel. No radiopaque foreign body. There are vascular calcifications. IMPRESSION: 1. Skin irregularity about the posterior heel, represent soft tissue ulcer. No radiographic findings of osteomyelitis. 2. Mild hammertoe deformity of the digits. Electronically Signed   By: Keith Rake M.D.   On: 05/01/2020 16:08   DG Foot Complete Right  Result Date: 05/01/2020 CLINICAL DATA:  Infection. Fever. Technologist notes state: Foot sores. EXAM: RIGHT FOOT COMPLETE - 3+ VIEW COMPARISON:  Radiograph 02/05/2020.  Foot MRI 02/06/2020. FINDINGS: Mild hammertoe deformity of the digits. Mild hallux valgus. No fracture. No periosteal reaction, bony destruction, or abnormal density to suggest osteomyelitis. There is skin irregularity involving the posterior aspect of the heel. There is a faint curvilinear 11 mm density in the soft tissues that is seen only on the lateral view. Mild hindfoot osteoarthritis. There are vascular calcifications. IMPRESSION: 1. Soft tissue irregularity involving the posterior aspect of the heel, suspicious for ulcer. Faint curvilinear 11 mm density in the soft tissues may represent a foreign body. This is also seen on prior exam, however head no MRI correlate. 2. No radiographic findings of osteomyelitis. 3. Mild hammertoe deformity of the digits and hallux valgus. No acute osseous abnormality. Electronically Signed  By: Keith Rake M.D.   On: 05/01/2020 16:04     Labs:   Basic Metabolic Panel: Recent Labs  Lab 05/28/20 0339 05/28/20 0339 05/29/20 0340 05/29/20 0340 05/30/20 0206 05/30/20 0206 05/31/20 0354 06/01/20 0216  NA 142  --  145  --  145  --  142 145  K 4.2   < > 3.9   < > 4.1   < > 5.1 4.5  CL  112*  --  114*  --  112*  --  116* 114*  CO2 15*  --  21*  --  19*  --  15* 19*  GLUCOSE 85  --  88  --  116*  --  88 101*  BUN 88*  --  80*  --  77*  --  72* 67*  CREATININE 2.34*  --  2.28*  --  2.29*  --  2.19* 2.41*  CALCIUM 8.3*  --  8.5*  --  8.4*  --  8.2* 8.3*  MG 2.8*  --  2.8*  --  2.9*  --  2.9* 2.8*  PHOS 5.5*  --  5.3*  --  5.2*  --  5.1* 5.1*   < > = values in this interval not displayed.   GFR Estimated Creatinine Clearance: 18.4 mL/min (A) (by C-G formula based on SCr of 2.41 mg/dL (H)). Liver Function Tests: Recent Labs  Lab 05/28/20 0339 05/29/20 0340 05/30/20 0206 05/31/20 0354 06/01/20 0216  ALBUMIN 1.5* 1.5* 1.5* 1.5* 1.4*   No results for input(s): LIPASE, AMYLASE in the last 168 hours. No results for input(s): AMMONIA in the last 168 hours. Coagulation profile No results for input(s): INR, PROTIME in the last 168 hours.  CBC: Recent Labs  Lab 05/28/20 0339 05/29/20 0340 05/30/20 0206 05/31/20 0354 06/01/20 0216  WBC 7.4 7.7 7.7 8.9 8.9  NEUTROABS 4.4 4.4 4.5 5.1 5.2  HGB 10.4* 11.3* 11.0* 11.7* 11.5*  HCT 35.0* 37.3 38.3 39.7 39.1  MCV 99.4 98.2 100.5* 97.1 99.7  PLT 476* 479* 352 380 329   Cardiac Enzymes: No results for input(s): CKTOTAL, CKMB, CKMBINDEX, TROPONINI in the last 168 hours. BNP: Invalid input(s): POCBNP CBG: Recent Labs  Lab 05/31/20 2004 06/01/20 0005 06/01/20 0446 06/01/20 0757 06/01/20 1230  GLUCAP 147* 111* 94 102* 107*   D-Dimer No results for input(s): DDIMER in the last 72 hours. Hgb A1c No results for input(s): HGBA1C in the last 72 hours. Lipid Profile No results for input(s): CHOL, HDL, LDLCALC, TRIG, CHOLHDL, LDLDIRECT in the last 72 hours. Thyroid function studies No results for input(s): TSH, T4TOTAL, T3FREE, THYROIDAB in the last 72 hours.  Invalid input(s): FREET3 Anemia work up No results for input(s): VITAMINB12, FOLATE, FERRITIN, TIBC, IRON, RETICCTPCT in the last 72  hours. Microbiology Recent Results (from the past 240 hour(s))  SARS CORONAVIRUS 2 (TAT 6-24 HRS) Nasopharyngeal Nasopharyngeal Swab     Status: None   Collection Time: 05/31/20  5:16 PM   Specimen: Nasopharyngeal Swab  Result Value Ref Range Status   SARS Coronavirus 2 NEGATIVE NEGATIVE Final    Comment: (NOTE) SARS-CoV-2 target nucleic acids are NOT DETECTED.  The SARS-CoV-2 RNA is generally detectable in upper and lower respiratory specimens during the acute phase of infection. Negative results do not preclude SARS-CoV-2 infection, do not rule out co-infections with other pathogens, and should not be used as the sole basis for treatment or other patient management decisions. Negative results must be combined with clinical observations, patient history, and  epidemiological information. The expected result is Negative.  Fact Sheet for Patients: SugarRoll.be  Fact Sheet for Healthcare Providers: https://www.woods-mathews.com/  This test is not yet approved or cleared by the [place] FDA and  has been authorized for detection and/or diagnosis of SARS-CoV-2 by FDA under an Emergency Use Authorization (EUA). This EUA will remain  in effect (meaning this test can be used) for the duration of the COVID-19 declaration under Se ction 564(b)(1) of the Act, 21 U.S.C. section 360bbb-3(b)(1), unless the authorization is terminated or revoked sooner.  Performed at [facility], [place] [address], [place], [place] [address]      Discharge Instructions:   Discharge Instructions    Call MD for:  redness, tenderness, or signs of infection (pain, swelling, redness, odor or green/yellow discharge around incision site)   Complete by: As directed    Call MD for:  severe uncontrolled pain   Complete by: As directed    Call MD for:  temperature >100.4   Complete by: As directed    Diet - low sodium heart healthy   Complete by: As directed     Discharge instructions   Complete by: As directed    Follow-up primary care provider at the skilled nursing facility in 3 to 5 days.  Continue antibiotics as prescribed through the PICC line.  Follow-up with infectious disease on 06/22/2020 in with orthopedic surgery in 2 weeks.  Follow-up with your nephrologist/renal transplant team as scheduled by you.   Discharge wound care:   Complete by: As directed    Wound care to left foot-clean with soap and water pat dry.  Use single-layer Xeroform gauze top with dry gauze and wrapp with a Kerlix roll.  Place feet in Prevalon boots.   Increase activity slowly   Complete by: As directed      Allergies as of 06/01/2020      Reactions   Propoxyphene N-acetaminophen Other (See Comments)   Severe headache   Hydrocodone-acetaminophen Other (See Comments)   Severe headache   Astemizole Rash, Other (See Comments)   headache   Cephalosporins Rash      Medication List    STOP taking these medications   famotidine 20 MG tablet Commonly known as: PEPCID   furosemide 20 MG tablet Commonly known as: LASIX   hydrocortisone 2.5 % rectal cream Commonly known as: ANUSOL-HC   megestrol 40 MG/ML suspension Commonly known as: MEGACE   methenamine 1 g tablet Commonly known as: HIPREX   pregabalin 25 MG capsule Commonly known as: LYRICA   pyridOXINE 50 MG tablet Commonly known as: B-6   traMADol 50 MG tablet Commonly known as: ULTRAM   traZODone 50 MG tablet Commonly known as: DESYREL   valGANciclovir 450 MG tablet Commonly known as: VALCYTE     TAKE these medications   amiodarone 200 MG tablet Commonly known as: PACERONE Take 200 mg by mouth daily.   ascorbic acid 250 MG tablet Commonly known as: VITAMIN C Take 1 tablet (250 mg total) by mouth 2 (two) times daily.   atorvastatin 20 MG tablet Commonly known as: LIPITOR Take 20 mg by mouth at bedtime.   dicyclomine 10 MG capsule Commonly known as: BENTYL Take 10 mg by mouth 3  (three) times daily as needed for spasms.   Eliquis 5 MG Tabs tablet Generic drug: apixaban Take 5 mg by mouth every 12 (twelve) hours.   Ensure Take 237 mLs by mouth 2 (two) times daily as needed (meal supplement).  folic acid 1 MG tablet Commonly known as: FOLVITE Take 1 mg by mouth daily.   Gerhardt's butt cream Crea Apply 1 application topically as needed for irritation.   meropenem 1 g in sodium chloride 0.9 % 100 mL Inject 1 g into the vein every 12 (twelve) hours. Indication:  Endocarditis Last Day of Therapy:  06/29/2020 Labs - Once weekly:  CBC/D and BMP, Labs - Every other week:  ESR and CRP   mirtazapine 7.5 MG tablet Commonly known as: REMERON Take 1 tablet (7.5 mg total) by mouth at bedtime.   multivitamin with minerals Tabs tablet Take 1 tablet by mouth daily.   naphazoline-glycerin 0.012-0.2 % Soln Commonly known as: CLEAR EYES REDNESS Place 1-2 drops into the right eye 4 (four) times daily as needed for eye irritation.   nortriptyline 25 MG capsule Commonly known as: PAMELOR TAKE 1 CAPSULE (25 MG TOTAL) BY MOUTH AT BEDTIME.   ondansetron 4 MG tablet Commonly known as: ZOFRAN Take 1 tablet (4 mg total) by mouth every 6 (six) hours as needed for nausea.   oxyCODONE 5 MG immediate release tablet Commonly known as: Oxy IR/ROXICODONE Take 1 tablet (5 mg total) by mouth every 6 (six) hours as needed for moderate pain or severe pain.   pantoprazole 40 MG tablet Commonly known as: PROTONIX Take 40 mg by mouth daily.   predniSONE 5 MG tablet Commonly known as: DELTASONE Take 1 tablet (5 mg total) by mouth daily.   ramelteon 8 MG tablet Commonly known as: ROZEREM Take 8 mg by mouth at bedtime.   senna-docusate 8.6-50 MG tablet Commonly known as: Senokot-S Take 1 tablet by mouth daily. Start taking on: June 02, 2020   sodium bicarbonate 650 MG tablet Take 1 tablet (650 mg total) by mouth 2 (two) times daily.   tacrolimus 0.5 MG capsule Commonly  known as: PROGRAF Take 1.5 mg by mouth every 12 (twelve) hours. What changed: Another medication with the same name was removed. Continue taking this medication, and follow the directions you see here.   thiamine 100 MG tablet Take 100 mg by mouth daily.   Veltassa 16.8 g Pack Generic drug: Patiromer Sorbitex Calcium Take 16.8 mg by mouth daily. Mix in orange juice and drink   Vitamin D-3 25 MCG (1000 UT) Caps Take 1,000 Units by mouth daily.   zinc sulfate 220 (50 Zn) MG capsule Take 1 capsule (220 mg total) by mouth daily. Start taking on: June 02, 2020            Discharge Care Instructions  (From admission, onward)         Start     Ordered   06/01/20 0000  Discharge wound care:       Comments: Wound care to left foot-clean with soap and water pat dry.  Use single-layer Xeroform gauze top with dry gauze and wrapp with a Kerlix roll.  Place feet in Prevalon boots.   06/01/20 1246          Follow-up Information    [name], MD. Schedule an appointment as soon as possible for a visit in 2 week(s).   Specialty: Orthopedic Surgery Contact information: [address] [place] [address] [phone]        [facility] for Infectious Disease Follow up on 06/22/2020.   Specialty: Infectious Diseases Why: TELEPHONE or VIDEO visit arranged @ 10:45 am with Janene Madeira, NP  Contact information: [address] [place], [place] [id number] mc  [place] [phone]               Time coordinating discharge: 39 minutes  Signed:  Avien Taha  [facility] 06/01/2020, 12:47 PM

## 2020-06-01 NOTE — Progress Notes (Signed)
Secure chat with Dr Sherlon Handing re PICC order with CKD stage 4 patient. Informed about the policy for Nephro approval before placing PICC line. Per Dr Louanne Belton L. Nephrologist Dr Hassell Done suggests Midline versus tunneled catheter. Per MD note antibiotic end date 06/27/2020.

## 2020-06-01 NOTE — TOC Transition Note (Addendum)
Transition of Care [facility]) - CM/SW Discharge Note   Patient Details  Name: Wanda [name] MRN: [id number] Date of Birth: 06-03-1961  [facility] CM/SW Contact:  Vinie Sill, [place] Phone Number: 06/01/2020, 1:46 PM   Clinical Narrative:     Patient will DC to: [facility] Date: 06/01/2020 Family Notified: spouse,[name] Transport By: Corey Harold  Please make sure signed script goes with the patient to SNF.  Per MD patient is ready for discharge. RN, patient, and facility notified of DC. Discharge Summary sent to facility. RN given number for [phone], Room 111. Ambulance transport requested for patient.   Clinical Social Worker signing off. Thurmond Butts, MSW, [place] Clinical Social Worker   Final next level of care: Skilled Nursing Facility Barriers to Discharge: Barriers Resolved   Patient Goals and CMS Choice Patient states their goals for this hospitalization and ongoing recovery are:: to get better CMS Medicare.gov Compare Post Acute Care list provided to:: Patient Choice offered to / list presented to : Patient  Discharge Placement PASRR number recieved: 05/22/20            Patient chooses bed at: [facility] Patient to be transferred to facility by: [place] Name of family member notified: spouse,[name] Patient and family notified of of transfer: 06/01/20  Discharge Plan and Services                                     Social Determinants of Health ([place]) Interventions     Readmission Risk Interventions Readmission Risk Prevention Plan 02/06/2020  Transportation Screening Complete  PCP or Specialist Appt within 3-5 Days Complete  HRI or [place] Complete  Social Work Consult for [place] Planning/Counseling Complete  Palliative Care Screening Not Applicable  Medication Review Press photographer) Complete  Some recent data might be hidden

## 2020-06-01 NOTE — Plan of Care (Signed)
Continue to monitor

## 2020-06-01 NOTE — TOC Progression Note (Signed)
Transition of Care [facility]) - Progression Note    Patient Details  Name: Wanda [name] MRN: [id number] Date of Birth: Jan 17, 1961  Transition of Care [facility]) CM/SW [place], [place] Phone Number: 06/01/2020, 4:56 PM  Clinical Narrative:     Per RN - still waiting on PICC line placement- called and advised [facility]  of late admission.   RN given PTAR # [phone] to arranged transport when the patient is ready.  Expected Discharge Plan: Skilled Nursing Facility Barriers to Discharge: Barriers Resolved  Expected Discharge Plan and Services Expected Discharge Plan: [place] arrangements for the past 2 months: Single Family Home Expected Discharge Date: 06/01/20                                     Social Determinants of Health (SDOH) Interventions    Readmission Risk Interventions Readmission Risk Prevention Plan 02/06/2020  Transportation Screening Complete  PCP or Specialist Appt within 3-5 Days Complete  HRI or [place] Complete  Social Work Consult for [place] Planning/Counseling Complete  Palliative Care Screening Not Applicable  Medication Review [employer]) Complete  Some recent data might be hidden

## 2020-06-04 LAB — BK QUANT PCR (PLASMA/SERUM)
BK Quantitaion PCR: 833680 copies/mL
Log10 BK Qn PCR: 5.921 log10copy/mL

## 2020-06-22 ENCOUNTER — Encounter: Payer: Self-pay | Admitting: Infectious Diseases

## 2020-06-22 ENCOUNTER — Telehealth (INDEPENDENT_AMBULATORY_CARE_PROVIDER_SITE_OTHER): Payer: Medicare HMO | Admitting: Infectious Diseases

## 2020-06-22 ENCOUNTER — Other Ambulatory Visit: Payer: Self-pay

## 2020-06-22 ENCOUNTER — Telehealth: Payer: Self-pay

## 2020-06-22 DIAGNOSIS — E43 Unspecified severe protein-calorie malnutrition: Secondary | ICD-10-CM | POA: Diagnosis not present

## 2020-06-22 DIAGNOSIS — R0989 Other specified symptoms and signs involving the circulatory and respiratory systems: Secondary | ICD-10-CM

## 2020-06-22 NOTE — Telephone Encounter (Signed)
Spoke with [place] at [employer] to [employer] orders per Janene Madeira, NP that okay to stop IV meropenem and pull PICC. Orders repeated and verified.  Beryle Flock, RN

## 2020-06-22 NOTE — Progress Notes (Signed)
Patient: Wanda [name]  DOB: 1961/02/10 MRN: [id number] PCP: Lorene Dy, MD   VIRTUAL CARE ENCOUNTER  I connected with Wanda [name] on 07/10/20 at 10:45 AM EDT by TELEPHONE and verified that I am speaking with the correct person using two identifiers.   I discussed the limitations, risks, security and privacy concerns of performing an evaluation and management service by telephone and the availability of in person appointments. I also discussed with the patient that there may be a patient responsible charge related to this service. The patient expressed understanding and agreed to proceed.  Patient Location: SNF in [place]   Other Participants: none  Provider Location: [facility] Office    Patient Active Problem List   Diagnosis Date Noted  . Wound dehiscence 07/11/2020  . Hyperkalemia 07/06/2020  . Metabolic acidosis 70/17/7939  . Suspected endocarditis 06/23/2020  . Bacteremia due to Klebsiella pneumoniae 05/21/2020  . Pain of foot   . Palliative care by specialist   . Goals of care, counseling/discussion   . DNR (do not resuscitate) discussion   . Failure to thrive in adult   . Pressure injury of skin 05/06/2020  . Wound infection 05/01/2020  . Leukocytosis 05/01/2020  . Protein-calorie malnutrition, severe ([place]) 02/05/2020  . AKI (acute kidney injury) ([place]) 02/04/2020  . A-V fistula ([place]) 02/08/2018  . 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 Peritonitis ([place]) 10/07/2016  . Atrial fibrillation ([place]) 02/24/2016  . Depression 02/24/2016  . Stroke ([place]) 02/24/2016  . Morbid obesity ([place]) 05/26/2015  . Burn any degree involving less than 10 percent of body surface 04/21/2015  . Neuropathic pain 04/21/2015  . Peripheral neuropathy 04/21/2015  . Nausea and vomiting 01/08/2013  . Renal failure (ARF), acute on chronic (HCC) 01/08/2013  . Renal transplant disorder 09/04/2012  . CKD (chronic kidney disease) 09/04/2012  . HTN (hypertension) 09/04/2012  .  Anemia 09/04/2012  . Urinary retention 07/16/2012  . History of kidney transplant 05/18/2012  . Acquired absence of other specified parts of digestive tract 05/17/2012  . Cough 03/22/2011  . SHINGLES 12/21/2009  . HYPOTHYROIDISM 12/21/2009  . DM (diabetes mellitus), type 2 with renal complications ([place]) 03/00/9233  . HYPERLIPIDEMIA 12/21/2009  . OBESITY 12/21/2009  . DYSPNEA 12/21/2009  . MIGRAINES, HX OF 12/21/2009  . Essential hypertension 11/20/2009  . CHRONIC KIDNEY DISEASE STAGE IV (SEVERE) 11/20/2009  . DYSPNEA ON EXERTION 11/20/2009  . NONSPECIFIC ABNORMAL UNSPEC CV FUNCTION STUDY 11/20/2009     Subjective:  Wanda [name] is a 59 y.o. hospitalized recently for RLE wound s/p amputation. Later developed klebsiella aerogenes bacteremia in 4/4 bottles. TTE indicated concern over possible left sided native endocarditis. Unable to do TEE d/t multiple risks associated with the procedure - she was continued on Meropenem for 6 weeks for suspected endocarditis.   She is on treatment day 36 now. Reports that she has had a rash develop with raised hives for the last 3 days. Describes it to be getting worse and nothing helps it she has been getting at the SNF. No other new medications from her understanding have been started recently to explain.   No headaches, dizziness, vision changes, abdominal pain. No fevers or chills. Wounds have healed up. She has no further enteric tube and eating liberally with improved appetite.    Review of Systems  Constitutional: Negative for chills, fever, malaise/fatigue and weight loss.  HENT: Negative for sore throat.   Respiratory: Negative for cough and sputum production.   Cardiovascular: Negative  for chest pain and leg swelling.  Gastrointestinal: Negative for abdominal pain, diarrhea and vomiting.  Genitourinary: Negative for dysuria and flank pain.  Musculoskeletal: Negative for joint pain, myalgias and neck pain.  Skin: Positive for itching and  rash.  Neurological: Negative for dizziness, tingling and headaches.  Psychiatric/Behavioral: Negative for depression and substance abuse. The patient is not nervous/anxious and does not have insomnia.     Past Medical History:  Diagnosis Date  . Anemia   . Arthritis    "hands" (07/24/2013) pt denies  . Chronic kidney disease, stage IV (severe) ([place])    "never went on dialysis" (07/24/2013)  . Diabetes mellitus type II   . Diabetic peripheral neuropathy ([place])   . Dialysis patient [facility])    Monday Wednesday Friday   . Dyspnea on exertion    pt denies  . Foot pain   . GERD (gastroesophageal reflux disease)   . Gout   . History of chicken pox   . History of measles   . History of mumps   . Hyperlipidemia   . Hypothyroidism   . Migraines    "q 3 months" (07/24/2013)  . Nonspecific abnormal unspecified cardiovascular function study   . Obesity   . PAD (peripheral artery disease) ([place])   . Peripheral neuropathy   . Peripheral vascular disease ([place])   . Pneumonia 09/2012  . Renal transplant disorder   . Shingles   . Sleep apnea    "getting ready to get tested again cause dr says I have it" (07/24/2013) pt states was tested and was told she did not have sleep apnea / no cpap  . Unspecified essential hypertension     No facility-administered medications prior to visit.   Outpatient Medications Prior to Visit  Medication Sig Dispense Refill  . amiodarone (PACERONE) 200 MG tablet Take 200 mg by mouth daily.    Wanda [name] Kitchen apixaban (ELIQUIS) 5 MG TABS tablet Take 5 mg by mouth every 12 (twelve) hours.    Wanda [name] Kitchen ascorbic acid (VITAMIN C) 250 MG tablet Take 1 tablet (250 mg total) by mouth 2 (two) times daily.    Wanda [name] Kitchen atorvastatin (LIPITOR) 20 MG tablet Take 20 mg by mouth at bedtime.     . Cholecalciferol (VITAMIN D-3) 25 MCG (1000 UT) CAPS Take 1,000 Units by mouth daily.    Wanda [name] Kitchen dicyclomine (BENTYL) 10 MG capsule Take 10 mg by mouth every 8 (eight) hours as needed for spasms.     . folic acid  (FOLVITE) 1 MG tablet Take 1 mg by mouth daily.    . mirtazapine (REMERON) 7.5 MG tablet Take 1 tablet (7.5 mg total) by mouth at bedtime.    . Multiple Vitamin (MULTIVITAMIN WITH MINERALS) TABS tablet Take 1 tablet by mouth daily.    . naphazoline-glycerin (CLEAR EYES REDNESS) 0.012-0.2 % SOLN Place 1-2 drops into the right eye 4 (four) times daily as needed for eye irritation.  0  . nortriptyline (PAMELOR) 25 MG capsule TAKE 1 CAPSULE (25 MG TOTAL) BY MOUTH AT BEDTIME. 90 capsule 2  . ondansetron (ZOFRAN) 4 MG tablet Take 1 tablet (4 mg total) by mouth every 6 (six) hours as needed for nausea. 20 tablet 0  . pantoprazole (PROTONIX) 40 MG tablet Take 40 mg by mouth daily.  3  . Patiromer Sorbitex Calcium (VELTASSA) 16.8 g PACK Take 16.8 mg by mouth daily. Mix in orange juice and drink    . predniSONE (DELTASONE) 5 MG tablet Take 1 tablet (5 mg total) by  mouth daily.    . sodium bicarbonate 650 MG tablet Take 1 tablet (650 mg total) by mouth 2 (two) times daily. 60 tablet 0  . tacrolimus (PROGRAF) 0.5 MG capsule Take 1.5 mg by mouth every 12 (twelve) hours.     . thiamine 100 MG tablet Take 100 mg by mouth daily.    Wanda [name] Kitchen zinc sulfate 220 (50 Zn) MG capsule Take 1 capsule (220 mg total) by mouth daily.    Wanda [name] Kitchen allopurinol (ZYLOPRIM) 300 MG tablet allopurinol 300 mg tablet  TAKE 1 TABLET BY MOUTH EVERY DAY    . amLODipine (NORVASC) 5 MG tablet amlodipine 5 mg tablet  TAKE 2 TABLETS BY MOUTH EVERY DAY    . colchicine (COLCRYS) 0.6 MG tablet Colcrys 0.6 mg tablet  TAKE 2 TABLET NOW, TAKE 1 MORE IN 1 HOUR    . Ensure (ENSURE) Take 237 mLs by mouth 2 (two) times daily as needed (meal supplement).    . famotidine (PEPCID) 20 MG tablet famotidine 20 mg tablet  TAKE 1 TABLET BY MOUTH TWICE A DAY    . furosemide (LASIX) 20 MG tablet furosemide 20 mg tablet  TAKE 1 TAB DAILY AS NEEDED FOR SWELLING    . HYDROcodone-acetaminophen (NORCO/VICODIN) 5-325 MG tablet hydrocodone 5 mg-acetaminophen 325 mg tablet   TAKE 1 TABLET BY MOUTH EVERY 6 (SIX) HOURS AS NEEDED FOR UP TO 5 DOSES FOR MODERATE PAIN.    . K Phos Mono-Sod Phos Di & Mono (PHOSPHA 250 NEUTRAL) 155-852-130 MG TABS Phospha 250 Neutral 250 mg tablet  TAKE 3 TABLETS 3 TIMES A DAY    . levothyroxine (SYNTHROID) 50 MCG tablet levothyroxine 50 mcg tablet  TAKE 1 TABLET BY MOUTH EVERY DAY    . megestrol (MEGACE) 40 MG/ML suspension megestrol 400 mg/10 mL (40 mg/mL) oral suspension  TAKE 10 ML BY MOUTH DAILY X 30 DAYS    . meropenem 1 g in sodium chloride 0.9 % 100 mL Inject 1 g into the vein every 12 (twelve) hours. Indication:  Endocarditis Last Day of Therapy:  06/29/2020 Labs - Once weekly:  CBC/D and BMP, Labs - Every other week:  ESR and CRP    . methenamine (HIPREX) 1 g tablet methenamine hippurate 1 gram tablet    . metoprolol tartrate (LOPRESSOR) 50 MG tablet metoprolol tartrate 50 mg tablet  TAKE 1 TABLET BY MOUTH TWICE A DAY    . mycophenolate (CELLCEPT) 250 MG capsule mycophenolate mofetil 250 mg capsule  TAKE 1 CAPSULE BY MOUTH EVERY 12 HOURS    . oxyCODONE (OXY IR/ROXICODONE) 5 MG immediate release tablet Take 1 tablet (5 mg total) by mouth every 6 (six) hours as needed for moderate pain or severe pain. (Patient not taking: Reported on 07/28/2020) 10 tablet 0  . polyethylene glycol powder (GLYCOLAX/MIRALAX) 17 GM/SCOOP powder Purelax 17 gram oral powder packet  TAKE 17 G BY MOUTH DAILY.    Wanda [name] Kitchen pregabalin (LYRICA) 25 MG capsule pregabalin 25 mg capsule  TAKE 1 CAPSULE BY MOUTH EVERY DAY    . sevelamer carbonate (RENVELA) 800 MG tablet sevelamer carbonate 800 mg tablet  TAKE 3 TABLETS BY MOUTH THREE TIMES DAILY WITH MEALS    . sitaGLIPtin (JANUVIA) 25 MG tablet Januvia 25 mg tablet  TAKE 1 TABLET BY MOUTH EVERY DAY    . sulfamethoxazole-trimethoprim (BACTRIM) 400-80 MG tablet sulfamethoxazole 400 mg-trimethoprim 80 mg tablet    . traMADol (ULTRAM) 50 MG tablet tramadol 50 mg tablet  Take 1 tablet every 4 hours by oral  route as needed.     . traZODone (DESYREL) 50 MG tablet trazodone 50 mg tablet  TAKE 1/2 TABLET BY MOUTH AT BEDTIME    . valGANciclovir (VALCYTE) 450 MG tablet valganciclovir 450 mg tablet  TAKE 1 TABLET BY MOUTH EVERY DAY    . ramelteon (ROZEREM) 8 MG tablet Take 8 mg by mouth at bedtime.     . senna-docusate (SENOKOT-S) 8.6-50 MG tablet Take 1 tablet by mouth daily.    Wanda [name] Kitchen Nystatin (GERHARDT'S BUTT CREAM) CREA Apply 1 application topically as needed for irritation. (Patient not taking: Reported on 06/22/2020)       Allergies  Allergen Reactions  . Propoxyphene N-Acetaminophen Other (See Comments)    Severe headache  . Hydrocodone-Acetaminophen Other (See Comments)     Severe headache   . Astemizole Rash and Other (See Comments)    headache   . Cephalosporins Rash    Social History   Tobacco Use  . Smoking status: Never Smoker  . Smokeless tobacco: Never Used  Vaping Use  . Vaping Use: Never used  Substance Use Topics  . Alcohol use: No  . Drug use: No    Family History  Problem Relation Age of Onset  . Colon cancer Father        deceased at age 67  . Diabetes Brother   . Neuropathy Neg Hx     Objective:  There were no vitals filed for this visit. There is no height or weight on file to calculate BMI.  Physical Exam Pulmonary:     Effort: Pulmonary effort is normal.     Comments: No shortness of breath detected in conversation.  Neurological:     Mental Status: She is oriented to person, place, and time.  Psychiatric:        Mood and Affect: Mood normal.        Behavior: Behavior normal.        Thought Content: Thought content normal.        Judgment: Judgment normal.     Lab Results: Lab Results  Component Value Date   WBC 13.7 (H) 07/10/2020   HGB 10.8 (L) 07/10/2020   HCT 34.7 (L) 07/10/2020   MCV 91.3 07/10/2020   PLT 186 07/10/2020    Lab Results  Component Value Date   CREATININE 2.86 (H) 07/10/2020   BUN 44 (H) 07/10/2020   NA 141 07/10/2020   K 5.5 (H)  07/10/2020   CL 116 (H) 07/10/2020   CO2 16 (L) 07/10/2020    Lab Results  Component Value Date   ALT 30 07/10/2020   AST 40 07/10/2020   ALKPHOS 520 (H) 07/10/2020   BILITOT 0.7 07/10/2020     Assessment & Plan:   Problem List Items Addressed This Visit      Unprioritized   Suspected endocarditis    While unlikely in the setting of recent gram negative rod bacteremia, not impossible and unable to evaluate with TEE d/t comorbid conditions. Will stop IV antibiotics d/t worsening rash and have her back for evaluation either by phone or in person in ~3 weeks.        Protein-calorie malnutrition, severe ([place])    Sounds as if she is improved with regards to her ability to maintain PO diet and enteric tube has been removed.        Follow Up Instructions:    I discussed the assessment and treatment plan with the patient. The patient was provided an opportunity to ask questions  and all were answered. The patient agreed with the plan and demonstrated an understanding of the instructions.   The patient was advised to call back or seek an in-person evaluation if the symptoms worsen or if the condition fails to improve as anticipated.  I provided 12 minutes of non-face-to-face time during this encounter.   Janene Madeira, MSN, NP-C [facility] for Infectious Disease [email or web address] Pager: [phone] Office: [phone] [facility] Main Line: Grenelefe, MSN, NP-C [place] [facility] [place] Pager: [phone] Office: [phone]  07/10/20  10:58 AM

## 2020-06-23 DIAGNOSIS — R0989 Other specified symptoms and signs involving the circulatory and respiratory systems: Secondary | ICD-10-CM | POA: Insufficient documentation

## 2020-06-23 NOTE — Assessment & Plan Note (Signed)
Sounds as if she is improved with regards to her ability to maintain PO diet and enteric tube has been removed.

## 2020-06-23 NOTE — Assessment & Plan Note (Signed)
While unlikely in the setting of recent gram negative rod bacteremia, not impossible and unable to evaluate with TEE d/t comorbid conditions. Will stop IV antibiotics d/t worsening rash and have her back for evaluation either by phone or in person in ~3 weeks.

## 2020-07-02 ENCOUNTER — Other Ambulatory Visit (HOSPITAL_COMMUNITY): Payer: Self-pay | Admitting: Orthopedic Surgery

## 2020-07-07 ENCOUNTER — Emergency Department (HOSPITAL_COMMUNITY): Payer: Medicare HMO

## 2020-07-07 ENCOUNTER — Observation Stay (HOSPITAL_COMMUNITY): Payer: Medicare HMO

## 2020-07-07 ENCOUNTER — Other Ambulatory Visit: Payer: Self-pay

## 2020-07-07 ENCOUNTER — Inpatient Hospital Stay (HOSPITAL_COMMUNITY)
Admission: EM | Admit: 2020-07-07 | Discharge: 2020-08-03 | DRG: 981 | Disposition: E | Payer: Medicare HMO | Attending: Internal Medicine | Admitting: Internal Medicine

## 2020-07-07 DIAGNOSIS — K5900 Constipation, unspecified: Secondary | ICD-10-CM | POA: Diagnosis not present

## 2020-07-07 DIAGNOSIS — L89322 Pressure ulcer of left buttock, stage 2: Secondary | ICD-10-CM | POA: Diagnosis present

## 2020-07-07 DIAGNOSIS — R34 Anuria and oliguria: Secondary | ICD-10-CM | POA: Diagnosis not present

## 2020-07-07 DIAGNOSIS — F32A Depression, unspecified: Secondary | ICD-10-CM | POA: Diagnosis present

## 2020-07-07 DIAGNOSIS — N390 Urinary tract infection, site not specified: Secondary | ICD-10-CM | POA: Diagnosis not present

## 2020-07-07 DIAGNOSIS — Z79899 Other long term (current) drug therapy: Secondary | ICD-10-CM

## 2020-07-07 DIAGNOSIS — I480 Paroxysmal atrial fibrillation: Secondary | ICD-10-CM | POA: Diagnosis present

## 2020-07-07 DIAGNOSIS — I129 Hypertensive chronic kidney disease with stage 1 through stage 4 chronic kidney disease, or unspecified chronic kidney disease: Secondary | ICD-10-CM | POA: Diagnosis present

## 2020-07-07 DIAGNOSIS — E1152 Type 2 diabetes mellitus with diabetic peripheral angiopathy with gangrene: Secondary | ICD-10-CM | POA: Diagnosis present

## 2020-07-07 DIAGNOSIS — D84821 Immunodeficiency due to drugs: Secondary | ICD-10-CM | POA: Diagnosis present

## 2020-07-07 DIAGNOSIS — E8809 Other disorders of plasma-protein metabolism, not elsewhere classified: Secondary | ICD-10-CM

## 2020-07-07 DIAGNOSIS — Z8679 Personal history of other diseases of the circulatory system: Secondary | ICD-10-CM

## 2020-07-07 DIAGNOSIS — I959 Hypotension, unspecified: Secondary | ICD-10-CM

## 2020-07-07 DIAGNOSIS — E43 Unspecified severe protein-calorie malnutrition: Secondary | ICD-10-CM | POA: Diagnosis present

## 2020-07-07 DIAGNOSIS — E1142 Type 2 diabetes mellitus with diabetic polyneuropathy: Secondary | ICD-10-CM | POA: Diagnosis present

## 2020-07-07 DIAGNOSIS — R627 Adult failure to thrive: Secondary | ICD-10-CM | POA: Diagnosis present

## 2020-07-07 DIAGNOSIS — Y830 Surgical operation with transplant of whole organ as the cause of abnormal reaction of the patient, or of later complication, without mention of misadventure at the time of the procedure: Secondary | ICD-10-CM | POA: Diagnosis present

## 2020-07-07 DIAGNOSIS — Z7952 Long term (current) use of systemic steroids: Secondary | ICD-10-CM

## 2020-07-07 DIAGNOSIS — T8743 Infection of amputation stump, right lower extremity: Secondary | ICD-10-CM | POA: Diagnosis present

## 2020-07-07 DIAGNOSIS — N184 Chronic kidney disease, stage 4 (severe): Secondary | ICD-10-CM | POA: Diagnosis present

## 2020-07-07 DIAGNOSIS — Z7901 Long term (current) use of anticoagulants: Secondary | ICD-10-CM

## 2020-07-07 DIAGNOSIS — L8992 Pressure ulcer of unspecified site, stage 2: Secondary | ICD-10-CM

## 2020-07-07 DIAGNOSIS — R54 Age-related physical debility: Secondary | ICD-10-CM

## 2020-07-07 DIAGNOSIS — Z89511 Acquired absence of right leg below knee: Secondary | ICD-10-CM

## 2020-07-07 DIAGNOSIS — B961 Klebsiella pneumoniae [K. pneumoniae] as the cause of diseases classified elsewhere: Secondary | ICD-10-CM | POA: Diagnosis present

## 2020-07-07 DIAGNOSIS — R64 Cachexia: Secondary | ICD-10-CM | POA: Diagnosis present

## 2020-07-07 DIAGNOSIS — B965 Pseudomonas (aeruginosa) (mallei) (pseudomallei) as the cause of diseases classified elsewhere: Secondary | ICD-10-CM | POA: Diagnosis present

## 2020-07-07 DIAGNOSIS — Z833 Family history of diabetes mellitus: Secondary | ICD-10-CM

## 2020-07-07 DIAGNOSIS — Z9049 Acquired absence of other specified parts of digestive tract: Secondary | ICD-10-CM

## 2020-07-07 DIAGNOSIS — Z8673 Personal history of transient ischemic attack (TIA), and cerebral infarction without residual deficits: Secondary | ICD-10-CM

## 2020-07-07 DIAGNOSIS — Z800 Family history of malignant neoplasm of digestive organs: Secondary | ICD-10-CM

## 2020-07-07 DIAGNOSIS — Z9884 Bariatric surgery status: Secondary | ICD-10-CM

## 2020-07-07 DIAGNOSIS — Z681 Body mass index (BMI) 19 or less, adult: Secondary | ICD-10-CM

## 2020-07-07 DIAGNOSIS — Z66 Do not resuscitate: Secondary | ICD-10-CM

## 2020-07-07 DIAGNOSIS — E875 Hyperkalemia: Secondary | ICD-10-CM

## 2020-07-07 DIAGNOSIS — Z885 Allergy status to narcotic agent status: Secondary | ICD-10-CM

## 2020-07-07 DIAGNOSIS — T8130XA Disruption of wound, unspecified, initial encounter: Secondary | ICD-10-CM

## 2020-07-07 DIAGNOSIS — N179 Acute kidney failure, unspecified: Secondary | ICD-10-CM | POA: Diagnosis not present

## 2020-07-07 DIAGNOSIS — E1122 Type 2 diabetes mellitus with diabetic chronic kidney disease: Secondary | ICD-10-CM | POA: Diagnosis present

## 2020-07-07 DIAGNOSIS — U071 COVID-19: Secondary | ICD-10-CM

## 2020-07-07 DIAGNOSIS — E870 Hyperosmolality and hypernatremia: Secondary | ICD-10-CM | POA: Diagnosis not present

## 2020-07-07 DIAGNOSIS — E872 Acidosis, unspecified: Secondary | ICD-10-CM

## 2020-07-07 DIAGNOSIS — T8781 Dehiscence of amputation stump: Secondary | ICD-10-CM | POA: Diagnosis present

## 2020-07-07 DIAGNOSIS — M109 Gout, unspecified: Secondary | ICD-10-CM | POA: Diagnosis present

## 2020-07-07 DIAGNOSIS — Y835 Amputation of limb(s) as the cause of abnormal reaction of the patient, or of later complication, without mention of misadventure at the time of the procedure: Secondary | ICD-10-CM | POA: Diagnosis present

## 2020-07-07 DIAGNOSIS — A498 Other bacterial infections of unspecified site: Secondary | ICD-10-CM

## 2020-07-07 DIAGNOSIS — E11649 Type 2 diabetes mellitus with hypoglycemia without coma: Secondary | ICD-10-CM | POA: Diagnosis not present

## 2020-07-07 DIAGNOSIS — Z881 Allergy status to other antibiotic agents status: Secondary | ICD-10-CM

## 2020-07-07 DIAGNOSIS — I70261 Atherosclerosis of native arteries of extremities with gangrene, right leg: Secondary | ICD-10-CM | POA: Diagnosis present

## 2020-07-07 DIAGNOSIS — R109 Unspecified abdominal pain: Secondary | ICD-10-CM

## 2020-07-07 DIAGNOSIS — T8619 Other complication of kidney transplant: Secondary | ICD-10-CM | POA: Diagnosis not present

## 2020-07-07 DIAGNOSIS — Z515 Encounter for palliative care: Secondary | ICD-10-CM

## 2020-07-07 DIAGNOSIS — E039 Hypothyroidism, unspecified: Secondary | ICD-10-CM | POA: Diagnosis present

## 2020-07-07 DIAGNOSIS — G4730 Sleep apnea, unspecified: Secondary | ICD-10-CM | POA: Diagnosis present

## 2020-07-07 DIAGNOSIS — K219 Gastro-esophageal reflux disease without esophagitis: Secondary | ICD-10-CM | POA: Diagnosis present

## 2020-07-07 DIAGNOSIS — R110 Nausea: Secondary | ICD-10-CM

## 2020-07-07 DIAGNOSIS — E860 Dehydration: Secondary | ICD-10-CM | POA: Diagnosis present

## 2020-07-07 DIAGNOSIS — E785 Hyperlipidemia, unspecified: Secondary | ICD-10-CM | POA: Diagnosis present

## 2020-07-07 DIAGNOSIS — E876 Hypokalemia: Secondary | ICD-10-CM | POA: Diagnosis present

## 2020-07-07 DIAGNOSIS — L03115 Cellulitis of right lower limb: Secondary | ICD-10-CM | POA: Diagnosis present

## 2020-07-07 DIAGNOSIS — Z992 Dependence on renal dialysis: Secondary | ICD-10-CM

## 2020-07-07 DIAGNOSIS — D631 Anemia in chronic kidney disease: Secondary | ICD-10-CM | POA: Diagnosis present

## 2020-07-07 DIAGNOSIS — Z8616 Personal history of COVID-19: Secondary | ICD-10-CM

## 2020-07-07 DIAGNOSIS — R571 Hypovolemic shock: Secondary | ICD-10-CM | POA: Diagnosis not present

## 2020-07-07 DIAGNOSIS — Z888 Allergy status to other drugs, medicaments and biological substances status: Secondary | ICD-10-CM

## 2020-07-07 DIAGNOSIS — Z8619 Personal history of other infectious and parasitic diseases: Secondary | ICD-10-CM

## 2020-07-07 DIAGNOSIS — I4891 Unspecified atrial fibrillation: Secondary | ICD-10-CM | POA: Diagnosis present

## 2020-07-07 DIAGNOSIS — R579 Shock, unspecified: Secondary | ICD-10-CM

## 2020-07-07 LAB — LACTIC ACID, PLASMA
Lactic Acid, Venous: 1.3 mmol/L (ref 0.5–1.9)
Lactic Acid, Venous: 1 mmol/L (ref 0.5–1.9)

## 2020-07-07 LAB — COMPREHENSIVE METABOLIC PANEL
ALT: 30 U/L (ref 0–44)
AST: 62 U/L — ABNORMAL HIGH (ref 15–41)
Albumin: 1.6 g/dL — ABNORMAL LOW (ref 3.5–5.0)
Alkaline Phosphatase: 568 U/L — ABNORMAL HIGH (ref 38–126)
Anion gap: 11 (ref 5–15)
BUN: 49 mg/dL — ABNORMAL HIGH (ref 6–20)
CO2: 17 mmol/L — ABNORMAL LOW (ref 22–32)
Calcium: 8.5 mg/dL — ABNORMAL LOW (ref 8.9–10.3)
Chloride: 111 mmol/L (ref 98–111)
Creatinine, Ser: 3.46 mg/dL — ABNORMAL HIGH (ref 0.44–1.00)
GFR calc non Af Amer: 14 mL/min — ABNORMAL LOW (ref >60)
Glucose, Bld: 81 mg/dL (ref 70–99)
Potassium: 6 mmol/L — ABNORMAL HIGH (ref 3.5–5.1)
Sodium: 139 mmol/L (ref 135–145)
Total Bilirubin: 0.9 mg/dL (ref 0.3–1.2)
Total Protein: 5.9 g/dL — ABNORMAL LOW (ref 6.5–8.1)

## 2020-07-07 LAB — CBC WITH DIFFERENTIAL/PLATELET
Abs Immature Granulocytes: 0.07 10*3/uL (ref 0.00–0.07)
Basophils Absolute: 0 10*3/uL (ref 0.0–0.1)
Basophils Relative: 0 %
Eosinophils Absolute: 0 10*3/uL (ref 0.0–0.5)
Eosinophils Relative: 0 %
HCT: 40.7 % (ref 36.0–46.0)
Hemoglobin: 12.4 g/dL (ref 12.0–15.0)
Immature Granulocytes: 1 %
Lymphocytes Relative: 4 %
Lymphs Abs: 0.5 10*3/uL — ABNORMAL LOW (ref 0.7–4.0)
MCH: 28.5 pg (ref 26.0–34.0)
MCHC: 30.5 g/dL (ref 30.0–36.0)
MCV: 93.6 fL (ref 80.0–100.0)
Monocytes Absolute: 0.3 10*3/uL (ref 0.1–1.0)
Monocytes Relative: 2 %
Neutro Abs: 13.2 10*3/uL — ABNORMAL HIGH (ref 1.7–7.7)
Neutrophils Relative %: 93 %
Platelets: 196 10*3/uL (ref 150–400)
RBC: 4.35 MIL/uL (ref 3.87–5.11)
RDW: 19.1 % — ABNORMAL HIGH (ref 11.5–15.5)
WBC: 14.1 10*3/uL — ABNORMAL HIGH (ref 4.0–10.5)
nRBC: 0 % (ref 0.0–0.2)

## 2020-07-07 LAB — POTASSIUM: Potassium: 5.9 mmol/L — ABNORMAL HIGH (ref 3.5–5.1)

## 2020-07-07 LAB — MAGNESIUM: Magnesium: 2.1 mg/dL (ref 1.7–2.4)

## 2020-07-07 LAB — RESPIRATORY PANEL BY RT PCR (FLU A&B, COVID)
Influenza A by PCR: NEGATIVE
Influenza B by PCR: NEGATIVE
SARS Coronavirus 2 by RT PCR: POSITIVE — AB

## 2020-07-07 LAB — SEDIMENTATION RATE: Sed Rate: 10 mm/hr (ref 0–22)

## 2020-07-07 MED ORDER — SODIUM BICARBONATE 8.4 % IV SOLN
50.0000 meq | Freq: Once | INTRAVENOUS | Status: AC
  Administered 2020-07-08: 50 meq via INTRAVENOUS
  Filled 2020-07-07: qty 50

## 2020-07-07 MED ORDER — SODIUM ZIRCONIUM CYCLOSILICATE 10 G PO PACK
10.0000 g | PACK | Freq: Once | ORAL | Status: AC
  Administered 2020-07-07: 10 g via ORAL
  Filled 2020-07-07: qty 1

## 2020-07-07 MED ORDER — HEPARIN SODIUM (PORCINE) 5000 UNIT/ML IJ SOLN: 5000.0000 [IU] | Freq: Three times a day (TID) | INTRAMUSCULAR | Status: DC

## 2020-07-07 MED ORDER — SODIUM CHLORIDE 0.9 % IV SOLN: INTRAVENOUS | Status: DC

## 2020-07-07 MED ORDER — DEXTROSE 50 % IV SOLN
1.0000 | Freq: Once | INTRAVENOUS | Status: AC
  Administered 2020-07-08: 50 mL via INTRAVENOUS
  Filled 2020-07-07: qty 50

## 2020-07-07 MED ORDER — INSULIN ASPART 100 UNIT/ML ~~LOC~~ SOLN
5.0000 [IU] | Freq: Once | SUBCUTANEOUS | Status: AC
  Administered 2020-07-08: 5 [IU] via INTRAVENOUS

## 2020-07-07 MED ORDER — CALCIUM GLUCONATE-NACL 1-0.675 GM/50ML-% IV SOLN
1.0000 g | Freq: Once | INTRAVENOUS | Status: AC
  Administered 2020-07-08: 1000 mg via INTRAVENOUS
  Filled 2020-07-07: qty 50

## 2020-07-07 NOTE — ED Triage Notes (Signed)
Pt presents from errands with family, called out for bleeding and signs of infection to 73week old amputation wound to RLE. EMS notes odor, drainage, no pain at wound (hemorrhoids pain reported). BP 89/49, improved to 101/53 without intervention. A&Ox4. P100, 100% RA

## 2020-07-07 NOTE — ED Notes (Addendum)
Wound care performed. Not yet assessed by wound ostomy nurse, wet-to-dry dressing per pt reports that this is method by home health performed after cleansing area

## 2020-07-07 NOTE — ED Triage Notes (Signed)
Pt reports that she was asked to return to surgical office to have dehiscense repaired, husband wanted to present to ED sooner. No wound vac present

## 2020-07-07 NOTE — H&P (Addendum)
History and Physical    Wanda [name] TXM:[id number] DOB: 1961-06-18 DOA: 07/05/2020  PCP: Lorene Dy, MD Patient coming from: Home  Chief Complaint: Wound dehiscence  HPI: Wanda [name] is a 59 y.o. female with medical history significant of paroxysmal A. fib on amiodarone and Eliquis, CKD stage IV with history of renal transplant on tacrolimus and prednisone, anemia of chronic disease, history of gastric sleeve, hypoalbuminemia, type 2 diabetes, neuropathy, hypertension, hypothyroidism, GERD, PAD.  Patient was admitted to the hospital 05/01/2020-06/01/2020 for septic shock secondary to right foot ulcer/cellulitis and Klebsiella bacteremia complicated by endocarditis.  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  ID recommended 6 weeks of antibiotic treatment with meropenem, end date 9/25.  Patient is presenting to the ED today with drainage and dehiscence of the stump site of her right lower extremity.  She thinks this has been going on for about a week.  States she is scheduled for wound closure with orthopedics in 2 days.  Reports having a poor appetite.  Denies fevers, cough, shortness of breath, chest pain, nausea, vomiting, abdominal pain, or diarrhea.  No additional history could be obtained from her.  ED Course: Afebrile.  Had low blood pressure readings initially in the ED but subsequent readings improved without any intervention.  Not tachycardic.  WBC 14.1, hemoglobin 12.4, hematocrit 40.7, platelet 196K.  Sodium 139, potassium 6.0, chloride 111, bicarb 17, BUN 49, creatinine 3.4 (baseline 2.1-2.4), glucose 81.  Alkaline phosphatase 568, chronically elevated.  No significant elevation of remainder of LFTs.  ESR normal.  Lactic acid normal x2.  Blood culture x2 pending.  SARS-CoV-2 PCR test pending.  X-ray of right tibia/fibula negative for acute osseous abnormality.  ED provider discussed the case with Dr. Carolin Sicks from nephrology who recommended giving  [facility] for hyperkalemia and giving gentle IV fluid hydration for AKI and rechecking renal function tomorrow.  If creatinine improves, outpatient follow-up.  However, if it continues to be elevated, consult nephrology in the daytime.  ED provider also discussed the case with Dr. Stann Mainland from orthopedics.  He recommended outpatient follow-up for wound closure in 2 days.  Patient was given Lokelma 10 g.  Review of Systems:  All systems reviewed and apart from history of presenting illness, are negative.  Past Medical History:  Diagnosis Date  . Anemia   . Arthritis    "hands" (07/24/2013) pt denies  . Chronic kidney disease, stage IV (severe) ([place])    "never went on dialysis" (07/24/2013)  . Diabetes mellitus type II   . Diabetic peripheral neuropathy ([place])   . Dialysis patient [facility])    Monday Wednesday Friday   . Dyspnea on exertion    pt denies  . Foot pain   . GERD (gastroesophageal reflux disease)   . Gout   . History of chicken pox   . History of measles   . History of mumps   . Hyperlipidemia   . Hypothyroidism   . Migraines    "q 3 months" (07/24/2013)  . Nonspecific abnormal unspecified cardiovascular function study   . Obesity   . PAD (peripheral artery disease) ([place])   . Peripheral neuropathy   . Peripheral vascular disease ([place])   . Pneumonia 09/2012  . Renal transplant disorder   . Shingles   . Sleep apnea    "getting ready to get tested again cause dr says I have it" (07/24/2013) pt states was tested and was told she did not have sleep apnea / no  cpap  . Unspecified essential hypertension     Past Surgical History:  Procedure Laterality Date  . AMPUTATION Right 05/07/2020   Procedure: AMPUTATION BELOW KNEE;  Surgeon: [name], MD;  Location: [place];  Service: Orthopedics;  Laterality: Right;  . AV FISTULA PLACEMENT Right 2000   "lower arm" (07/24/2013)  . AV FISTULA PLACEMENT, RADIOCEPHALIC Right 3762   "upper arm" (07/24/2013)  . CAPD REMOVAL N/A  10/10/2016   Procedure: CONTINUOUS AMBULATORY PERITONEAL DIALYSIS  (CAPD) CATHETER REMOVAL;  Surgeon: Donnie Mesa, MD;  Location: [place];  Service: General;  Laterality: N/A;  . CATARACT EXTRACTION W/ INTRAOCULAR LENS  IMPLANT, BILATERAL Bilateral 2011  . [place]; 1987  . CHOLECYSTECTOMY  2011  . COLONOSCOPY    . ENDOMETRIAL CRYOABLATION  1980's  . INTRAUTERINE DEVICE (IUD) INSERTION  2011  . LAPAROSCOPIC GASTRIC SLEEVE RESECTION N/A 05/26/2015   Procedure: LAPAROSCOPIC GASTRIC SLEEVE RESECTION;  Surgeon: Excell Seltzer, MD;  Location: [facility];  Service: General;  Laterality: N/A;  . TRANSPLANTATION RENAL  2013   "right" (09/04/2012)  . TUBAL LIGATION  1987  . [place]   "RLE; stripped" (09/04/2012)  . VIDEO BRONCHOSCOPY  09/07/2012   Procedure: VIDEO BRONCHOSCOPY WITHOUT FLUORO;  Surgeon: Chesley Mires, MD;  Location: [facility]COPY;  Service: Cardiopulmonary;  Laterality: Bilateral;     reports that she has never smoked. She has never used smokeless tobacco. She reports that she does not drink alcohol and does not use drugs.  Allergies  Allergen Reactions  . Propoxyphene N-Acetaminophen Other (See Comments)    Severe headache  . Hydrocodone-Acetaminophen Other (See Comments)     Severe headache   . Astemizole Rash and Other (See Comments)    headache   . Cephalosporins Rash    Family History  Problem Relation Age of Onset  . Colon cancer Father        deceased at age 98  . Diabetes Brother   . Neuropathy Neg Hx     Prior to Admission medications   Medication Sig Start Date End Date Taking? Authorizing Provider  amiodarone (PACERONE) 200 MG tablet Take 200 mg by mouth daily.    [provider]  apixaban (ELIQUIS) 5 MG TABS tablet Take 5 mg by mouth every 12 (twelve) hours.    [provider]  ascorbic acid (VITAMIN C) 250 MG tablet Take 1 tablet (250 mg total) by mouth 2 (two) times daily. 06/01/20   Pokhrel, Corrie Mckusick, MD    atorvastatin (LIPITOR) 20 MG tablet Take 20 mg by mouth at bedtime.     [provider]  Cholecalciferol (VITAMIN D-3) 25 MCG (1000 UT) CAPS Take 1,000 Units by mouth daily.    [provider]  dicyclomine (BENTYL) 10 MG capsule Take 10 mg by mouth every 8 (eight) hours as needed for spasms.     [provider]  feeding supplement, GLUCERNA SHAKE, (GLUCERNA SHAKE) LIQD Take 237 mLs by mouth in the morning, at noon, and at bedtime.    [provider]  folic acid (FOLVITE) 1 MG tablet Take 1 mg by mouth daily.    [provider]  hydrocortisone (ANUSOL-HC) 25 MG suppository Place 25 mg rectally 2 (two) times daily.    [provider]  hydrOXYzine (ATARAX/VISTARIL) 10 MG tablet Take 10 mg by mouth in the morning, at noon, in the evening, and at bedtime.    [provider]  Infant Care Products (DERMACLOUD) CREA Apply 1 application  topically daily as needed (Applu to buttock for irritaion).    [provider]  mirtazapine (REMERON) 7.5 MG tablet Take 1 tablet (7.5 mg total) by mouth at bedtime. 06/01/20   Pokhrel, Corrie Mckusick, MD  Multiple Vitamin (MULTIVITAMIN WITH MINERALS) TABS tablet Take 1 tablet by mouth daily.    [provider]  naphazoline-glycerin (CLEAR EYES REDNESS) 0.012-0.2 % SOLN Place 1-2 drops into the right eye 4 (four) times daily as needed for eye irritation. 06/01/20   Pokhrel, Corrie Mckusick, MD  nortriptyline (PAMELOR) 25 MG capsule TAKE 1 CAPSULE (25 MG TOTAL) BY MOUTH AT BEDTIME. 04/01/19   Patel, Domenick Bookbinder, MD  Nystatin (GERHARDT'S BUTT CREAM) CREA Apply 1 application topically as needed for irritation. Patient not taking: Reported on 06/22/2020 06/01/20   Flora Lipps, MD  nystatin (MYCOSTATIN/NYSTOP) powder Apply 1 application topically 3 (three) times daily. Apply to groin    [provider]  ondansetron (ZOFRAN) 4 MG tablet Take 1 tablet (4 mg total) by mouth every 6 (six) hours as needed for  nausea. 06/01/20   Pokhrel, Corrie Mckusick, MD  oxycodone (OXY-IR) 5 MG capsule Take 5 mg by mouth every 6 (six) hours as needed for pain.    [provider]  pantoprazole (PROTONIX) 40 MG tablet Take 40 mg by mouth daily. 01/16/18   [provider]  Patiromer Sorbitex Calcium (VELTASSA) 16.8 g PACK Take 16.8 mg by mouth daily. Mix in orange juice and drink    [provider]  predniSONE (DELTASONE) 5 MG tablet Take 1 tablet (5 mg total) by mouth daily. 06/01/20   Pokhrel, Corrie Mckusick, MD  ramelteon (ROZEREM) 8 MG tablet Take 8 mg by mouth at bedtime.     [provider]  senna-docusate (SENOKOT-S) 8.6-50 MG tablet Take 1 tablet by mouth daily. 06/02/20   Pokhrel, Corrie Mckusick, MD  sodium bicarbonate 650 MG tablet Take 1 tablet (650 mg total) by mouth 2 (two) times daily. 06/01/20   Pokhrel, Corrie Mckusick, MD  tacrolimus (PROGRAF) 0.5 MG capsule Take 1.5 mg by mouth every 12 (twelve) hours.     [provider]  thiamine 100 MG tablet Take 100 mg by mouth daily.    [provider]  zinc sulfate 220 (50 Zn) MG capsule Take 1 capsule (220 mg total) by mouth daily. 06/02/20   Flora Lipps, MD    Physical Exam: Vitals:   07/06/2020 1634 07/12/2020 1645 07/08/2020 1715 07/24/2020 1745  BP: (!) 91/53 (!) 98/49 119/61 123/70  Pulse: 86 84 85 84  Resp: '18 15 14 15  ' Temp: (!) 97.3 F (36.3 C)     TempSrc: Oral     SpO2: 100% 100% 100% 100%    Physical Exam Constitutional:      Comments: Cachectic  HENT:     Head: Normocephalic and atraumatic.     Mouth/Throat:     Mouth: Mucous membranes are dry.  Eyes:     Extraocular Movements: Extraocular movements intact.  Cardiovascular:     Rate and Rhythm: Normal rate and regular rhythm.     Pulses: Normal pulses.  Pulmonary:     Effort: Pulmonary effort is normal. No respiratory distress.     Breath sounds: No wheezing or rales.  Abdominal:     General: Bowel sounds are normal. There is no distension.     Palpations: Abdomen  is soft.     Tenderness: There is no abdominal tenderness.  Musculoskeletal:        General: No swelling or tenderness.  Cervical back: Normal range of motion and neck supple.     Comments: Right lower extremity stump site: See images.  No warmth to touch or purulent drainage noted.  Skin:    General: Skin is warm and dry.  Neurological:     General: No focal deficit present.     Mental Status: She is alert and oriented to person, place, and time.           Labs on Admission: I have personally reviewed following labs and imaging studies  CBC: Recent Labs  Lab 07/13/2020 1633  WBC 14.1*  NEUTROABS 13.2*  HGB 12.4  HCT 40.7  MCV 93.6  PLT 941   Basic Metabolic Panel: Recent Labs  Lab 07/18/2020 1633  NA 139  K 6.0*  CL 111  CO2 17*  GLUCOSE 81  BUN 49*  CREATININE 3.46*  CALCIUM 8.5*   GFR: CrCl cannot be calculated (Unknown ideal weight.). Liver Function Tests: Recent Labs  Lab 07/03/2020 1633  AST 62*  ALT 30  ALKPHOS 568*  BILITOT 0.9  PROT 5.9*  ALBUMIN 1.6*   No results for input(s): LIPASE, AMYLASE in the last 168 hours. No results for input(s): AMMONIA in the last 168 hours. Coagulation Profile: No results for input(s): INR, PROTIME in the last 168 hours. Cardiac Enzymes: No results for input(s): CKTOTAL, CKMB, CKMBINDEX, TROPONINI in the last 168 hours. BNP (last 3 results) No results for input(s): PROBNP in the last 8760 hours. HbA1C: No results for input(s): HGBA1C in the last 72 hours. CBG: No results for input(s): GLUCAP in the last 168 hours. Lipid Profile: No results for input(s): CHOL, HDL, LDLCALC, TRIG, CHOLHDL, LDLDIRECT in the last 72 hours. Thyroid Function Tests: No results for input(s): TSH, T4TOTAL, FREET4, T3FREE, THYROIDAB in the last 72 hours. Anemia Panel: No results for input(s): VITAMINB12, FOLATE, FERRITIN, TIBC, IRON, RETICCTPCT in the last 72 hours. Urine analysis:    Component Value Date/Time   COLORURINE  AMBER (A) 05/17/2020 1846   APPEARANCEUR CLOUDY (A) 05/17/2020 1846   LABSPEC 1.012 05/17/2020 1846   PHURINE 7.0 05/17/2020 1846   GLUCOSEU 50 (A) 05/17/2020 1846   HGBUR MODERATE (A) 05/17/2020 1846   BILIRUBINUR NEGATIVE 05/17/2020 1846   KETONESUR NEGATIVE 05/17/2020 1846   PROTEINUR 30 (A) 05/17/2020 1846   UROBILINOGEN 0.2 12/22/2013 1950   NITRITE NEGATIVE 05/17/2020 1846   LEUKOCYTESUR LARGE (A) 05/17/2020 1846    Radiological Exams on Admission: DG Tibia/Fibula Right  Result Date: 07/17/2020 CLINICAL DATA:  Wound DKA EXAM: RIGHT TIBIA AND FIBULA - 2 VIEW COMPARISON:  05/01/2020 FINDINGS: Extensive vascular calcifications. Patient is status post below the knee amputation. Cut margins of the tibia and fibula appear smooth and without bony destructive change. Mild degenerative changes at the right knee. IMPRESSION: Status post below the knee amputation. No acute osseous abnormality. Electronically Signed   By: Donavan Foil M.D.   On: 08/01/2020 17:40    EKG: Independently reviewed.  Sinus rhythm.  QT interval increased since prior tracing.  No other acute changes.  Assessment/Plan Principal Problem:   Wound dehiscence Active Problems:   Atrial fibrillation (HCC)   AKI (acute kidney injury) (HCC)   Hyperkalemia   Metabolic acidosis   Right lower extremity stump site wound dehiscence: Labs showing mild leukocytosis.  Patient had low blood pressure readings initially in the ED but subsequent readings improved without any intervention.  Currently normotensive.  Sepsis less likely given no fever or tachycardia.  Lactic acid normal x2.  ESR normal.  X-ray negative for acute osseous abnormality.  Patiently recently finished a 6 weeks course of meropenem on 9/25. -Will hold off starting antibiotics at this time.  Blood culture x2 pending.  Recheck WBC count in a.m. ED provider also discussed the case with Dr. Stann Mainland from orthopedics.  He recommended outpatient follow-up for wound  closure in 2 days.  Wound care consult placed.  AKI on CKD stage IV with history of renal transplant: Creatinine currently 3.4, baseline appears to be around 2.1-2.4. -Nephrology recommending gentle IV fluid hydration and rechecking renal function tomorrow.  Recommended outpatient follow-up if creatinine improves.  However, if there is no improvement in the patient's renal function please consult nephrology in a.m. -Resume prednisone and tacrolimus after pharmacy med rec is done  Hyperkalemia in setting of CKD stage IV: Creatinine 6.0.  EKG without acute changes related to hyperkalemia. -Patient was given [facility] in the ED as per nephrology recommendation.  Recheck potassium, if it continues to be elevated give additional medical treatment such as insulin/dextrose and/or sodium bicarb. Addendum: Potassium continues to be elevated on repeat labs.  Give calcium gluconate, insulin/dextrose, and sodium bicarbonate.  Repeat labs to check potassium level again.  Normal anion gap metabolic acidosis: Bicarb 17, anion gap 11.  Likely due to AKI on CKD stage IV. -Gentle IV fluid hydration, repeat metabolic panel in the morning.  Paroxysmal atrial fibrillation: Currently in sinus rhythm. -Resume home amiodarone and Eliquis after pharmacy med rec is done  History of type 2 diabetes: Not on medications.  A1c 4.1 on 05/02/2020.  Hypothyroidism: Not on thyroid hormone replacement therapy. -Check TSH level.  QT prolongation on EKG -Cardiac monitoring.  Monitor potassium and magnesium levels.  Repeat EKG in a.m.  Addendum: SARS-CoV-2 PCR test positive.  Patient is not endorsing any respiratory symptoms.  No signs of respiratory distress.  Not tachypneic or hypoxic.  Satting 100% on room air. -No indication for treatment at this time.  Airborne and contact precautions.  Check inflammatory markers including ferritin, fibrinogen, D-dimer, CRP, and LDH.  Order chest x-ray.  Addendum: Patient reports being  diagnosed with Covid while she was at [facility] rehab.  Reports being quarantined for 14 days in rehab and was released from quarantine last Friday on 10/1.  Chest x-ray showing resolved bilateral lung opacities from prior exam and no evidence of recurrent or persistent airspace disease/pneumonia.  Patient is not endorsing any respiratory symptoms.  No signs of respiratory distress.  Not tachypneic or hypoxic.  Since she is now past the 14-day quarantine period, airborne and contact precautions no longer needed.  Pharmacy med rec pending.  DVT prophylaxis: Hold chemical DVT prophylaxis at this time to minimize risk of bleeding from amputation stump wound site.  Left lower extremity SCD ordered. Code Status: Patient wishes to be full code. Family Communication: No family available at this time. Disposition Plan: Status is: Observation  The patient remains OBS appropriate and will d/c before 2 midnights.  Dispo: The patient is from: Home              Anticipated d/c is to: Home              Anticipated d/c date is: 1 day              Patient currently is not medically stable to d/c.  The medical decision making on this patient was of high complexity and the patient is at high risk for clinical deterioration, therefore this is a  level 3 visit.  Shela Leff MD [facility]  If 7PM-7AM, please contact night-coverage [email or web address]  07/10/2020, 8:49 PM

## 2020-07-07 NOTE — ED Provider Notes (Signed)
[facility] EMERGENCY DEPARTMENT Provider Note   CSN: [id number] Arrival date & time: 07/30/2020  1547     History Chief Complaint  Patient presents with  . Wound Dehiscence    Wanda [name] is a 59 y.o. female.  HPI Patient presents with drainage and dehiscence of stump of right lower extremity.  Had previous amputation due to infection.  Has had a kidney transplant.  Denies fevers.  States her husband sought and thought she needed to come to the ER because it should be close to her.  It is supposed to be closed in 2 days.  No fevers.  Blood pressure was low for EMS.  91 systolic upon arrival but patient states this is normal blood pressure for her.  Not currently on antibiotics.  EMS had described a foul smell but patient denies any foul smell.    Past Medical History:  Diagnosis Date  . Anemia   . Arthritis    "hands" (07/24/2013) pt denies  . Chronic kidney disease, stage IV (severe) ([place])    "never went on dialysis" (07/24/2013)  . Diabetes mellitus type II   . Diabetic peripheral neuropathy ([place])   . Dialysis patient [facility])    Monday Wednesday Friday   . Dyspnea on exertion    pt denies  . Foot pain   . GERD (gastroesophageal reflux disease)   . Gout   . History of chicken pox   . History of measles   . History of mumps   . Hyperlipidemia   . Hypothyroidism   . Migraines    "q 3 months" (07/24/2013)  . Nonspecific abnormal unspecified cardiovascular function study   . Obesity   . PAD (peripheral artery disease) ([place])   . Peripheral neuropathy   . Peripheral vascular disease ([place])   . Pneumonia 09/2012  . Renal transplant disorder   . Shingles   . Sleep apnea    "getting ready to get tested again cause dr says I have it" (07/24/2013) pt states was tested and was told she did not have sleep apnea / no cpap  . Unspecified essential hypertension     Patient Active Problem List   Diagnosis Date Noted  . Suspected endocarditis 06/23/2020    . Bacteremia due to Klebsiella pneumoniae 05/21/2020  . Pain of foot   . Palliative care by specialist   . Goals of care, counseling/discussion   . DNR (do not resuscitate) discussion   . Failure to thrive in adult   . Pressure injury of skin 05/06/2020  . Wound infection 05/01/2020  . Leukocytosis 05/01/2020  . Protein-calorie malnutrition, severe ([place]) 02/05/2020  . AKI (acute kidney injury) ([place]) 02/04/2020  . A-V fistula ([place]) 02/08/2018  . 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 Peritonitis ([place]) 10/07/2016  . Atrial fibrillation ([place]) 02/24/2016  . Depression 02/24/2016  . Stroke ([place]) 02/24/2016  . Morbid obesity ([place]) 05/26/2015  . Burn any degree involving less than 10 percent of body surface 04/21/2015  . Neuropathic pain 04/21/2015  . Peripheral neuropathy 04/21/2015  . Nausea and vomiting 01/08/2013  . Renal failure (ARF), acute on chronic (HCC) 01/08/2013  . Renal transplant disorder 09/04/2012  . CKD (chronic kidney disease) 09/04/2012  . HTN (hypertension) 09/04/2012  . Anemia 09/04/2012  . Urinary retention 07/16/2012  . History of kidney transplant 05/18/2012  . Acquired absence of other specified parts of digestive tract 05/17/2012  . Cough 03/22/2011  . SHINGLES 12/21/2009  . HYPOTHYROIDISM 12/21/2009  .  DM (diabetes mellitus), type 2 with renal complications ([place]) 54/27/0623  . HYPERLIPIDEMIA 12/21/2009  . OBESITY 12/21/2009  . DYSPNEA 12/21/2009  . MIGRAINES, HX OF 12/21/2009  . Essential hypertension 11/20/2009  . CHRONIC KIDNEY DISEASE STAGE IV (SEV[place]) 11/20/2009  . DYSPNEA ON EXERTION 11/20/2009  . NONSPECIFIC ABNORMAL UNSPEC CV FUNCTION STUDY 11/20/2009    Past Surgical History:  Procedure Laterality Date  . AMPUTATION Right 05/07/2020   Procedure: AMPUTATION BELOW KNEE;  Surgeon: [name], MD;  Location: [facility];  Service: Orthopedics;  Laterality: Right;  . AV FISTULA PLACEMENT Right 2000   "lower arm" (07/24/2013)  . AV  FISTULA PLACEMENT, RADIOCEPHALIC Right 7628   "upper arm" (07/24/2013)  . CAPD REMOVAL N/A 10/10/2016   Procedure: CONTINUOUS AMBULATORY PERITONEAL DIALYSIS  (CAPD) CATHETER REMOVAL;  Surgeon: Donnie Mesa, MD;  Location: [place];  Service: General;  Laterality: N/A;  . CATARACT EXTRACTION W/ INTRAOCULAR LENS  IMPLANT, BILATERAL Bilateral 2011  . [place]; 1987  . CHOLECYSTECTOMY  2011  . COLONOSCOPY    . ENDOMETRIAL CRYOABLATION  1980's  . INTRAUTERINE DEVICE (IUD) INSERTION  2011  . LAPAROSCOPIC GASTRIC SLEEVE RESECTION N/A 05/26/2015   Procedure: LAPAROSCOPIC GASTRIC SLEEVE RESECTION;  Surgeon: Excell Seltzer, MD;  Location: [facility];  Service: General;  Laterality: N/A;  . TRANSPLANTATION RENAL  2013   "right" (09/04/2012)  . TUBAL LIGATION  1987  . [place]   "RLE; stripped" (09/04/2012)  . VIDEO BRONCHOSCOPY  09/07/2012   Procedure: VIDEO BRONCHOSCOPY WITHOUT FLUORO;  Surgeon: Chesley Mires, MD;  Location: [facility];  Service: Cardiopulmonary;  Laterality: Bilateral;     OB History   No obstetric history on file.     Family History  Problem Relation Age of Onset  . Colon cancer Father        deceased at age 14  . Diabetes Brother   . Neuropathy Neg Hx     Social History   Tobacco Use  . Smoking status: Never Smoker  . Smokeless tobacco: Never Used  Vaping Use  . Vaping Use: Never used  Substance Use Topics  . Alcohol use: No  . Drug use: No    Home Medications Prior to Admission medications   Medication Sig Start Date End Date Taking? Authorizing Provider  amiodarone (PACERONE) 200 MG tablet Take 200 mg by mouth daily.    [provider]  apixaban (ELIQUIS) 5 MG TABS tablet Take 5 mg by mouth every 12 (twelve) hours.    [provider]  ascorbic acid (VITAMIN C) 250 MG tablet Take 1 tablet (250 mg total) by mouth 2 (two) times daily. 06/01/20   Pokhrel, Corrie Mckusick, MD  atorvastatin (LIPITOR) 20 MG tablet Take 20 mg by  mouth at bedtime.     [provider]  Cholecalciferol (VITAMIN D-3) 25 MCG (1000 UT) CAPS Take 1,000 Units by mouth daily.    [provider]  dicyclomine (BENTYL) 10 MG capsule Take 10 mg by mouth every 8 (eight) hours as needed for spasms.     [provider]  feeding supplement, GLUCERNA SHAKE, (GLUCERNA SHAKE) LIQD Take 237 mLs by mouth in the morning, at noon, and at bedtime.    [provider]  folic acid (FOLVITE) 1 MG tablet Take 1 mg by mouth daily.    [provider]  hydrocortisone (ANUSOL-HC) 25 MG suppository Place 25 mg rectally 2 (two) times daily.    [provider]  hydrOXYzine (ATARAX/VISTARIL) 10  MG tablet Take 10 mg by mouth in the morning, at noon, in the evening, and at bedtime.    [provider]  Infant Care Products (DERMACLOUD) CREA Apply 1 application topically daily as needed (Applu to buttock for irritaion).    [provider]  mirtazapine (REMERON) 7.5 MG tablet Take 1 tablet (7.5 mg total) by mouth at bedtime. 06/01/20   Pokhrel, Corrie Mckusick, MD  Multiple Vitamin (MULTIVITAMIN WITH MINERALS) TABS tablet Take 1 tablet by mouth daily.    [provider]  naphazoline-glycerin (CLEAR EYES REDNESS) 0.012-0.2 % SOLN Place 1-2 drops into the right eye 4 (four) times daily as needed for eye irritation. 06/01/20   Pokhrel, Corrie Mckusick, MD  nortriptyline (PAMELOR) 25 MG capsule TAKE 1 CAPSULE (25 MG TOTAL) BY MOUTH AT BEDTIME. 04/01/19   Patel, Domenick Bookbinder, MD  Nystatin (GERHARDT'S BUTT CREAM) CREA Apply 1 application topically as needed for irritation. Patient not taking: Reported on 06/22/2020 06/01/20   Flora Lipps, MD  nystatin (MYCOSTATIN/NYSTOP) powder Apply 1 application topically 3 (three) times daily. Apply to groin    [provider]  ondansetron (ZOFRAN) 4 MG tablet Take 1 tablet (4 mg total) by mouth every 6 (six) hours as needed for nausea. 06/01/20   Pokhrel, Corrie Mckusick, MD  oxycodone (OXY-IR)  5 MG capsule Take 5 mg by mouth every 6 (six) hours as needed for pain.    [provider]  pantoprazole (PROTONIX) 40 MG tablet Take 40 mg by mouth daily. 01/16/18   [provider]  Patiromer Sorbitex Calcium (VELTASSA) 16.8 g PACK Take 16.8 mg by mouth daily. Mix in orange juice and drink    [provider]  predniSONE (DELTASONE) 5 MG tablet Take 1 tablet (5 mg total) by mouth daily. 06/01/20   Pokhrel, Corrie Mckusick, MD  ramelteon (ROZEREM) 8 MG tablet Take 8 mg by mouth at bedtime.     [provider]  senna-docusate (SENOKOT-S) 8.6-50 MG tablet Take 1 tablet by mouth daily. 06/02/20   Pokhrel, Corrie Mckusick, MD  sodium bicarbonate 650 MG tablet Take 1 tablet (650 mg total) by mouth 2 (two) times daily. 06/01/20   Pokhrel, Corrie Mckusick, MD  tacrolimus (PROGRAF) 0.5 MG capsule Take 1.5 mg by mouth every 12 (twelve) hours.     [provider]  thiamine 100 MG tablet Take 100 mg by mouth daily.    [provider]  zinc sulfate 220 (50 Zn) MG capsule Take 1 capsule (220 mg total) by mouth daily. 06/02/20   Pokhrel, Corrie Mckusick, MD    Allergies    Propoxyphene n-acetaminophen, Hydrocodone-acetaminophen, Astemizole, and Cephalosporins  Review of Systems   Review of Systems  Constitutional: Negative for appetite change, fatigue and fever.  Respiratory: Negative for shortness of breath.   Cardiovascular: Negative for chest pain.  Gastrointestinal: Negative for abdominal pain.  Genitourinary: Negative for flank pain.  Musculoskeletal: Negative for back pain.  Skin: Positive for wound.  Neurological: Negative for weakness.  Psychiatric/Behavioral: Negative for confusion.    Physical Exam Updated Vital Signs BP 123/70   Pulse 84   Temp (!) 97.3 F (36.3 C) (Oral)   Resp 15   SpO2 100%   Physical Exam Constitutional:      Appearance: Normal appearance.  HENT:     Head: Atraumatic.  Eyes:     Pupils: Pupils are equal, round, and reactive to light.   Cardiovascular:     Rate and Rhythm: Regular rhythm.  Pulmonary:     Breath sounds: No wheezing  or rhonchi.  Abdominal:     Tenderness: There is no abdominal tenderness.  Musculoskeletal:     Comments: Previous below the knee amputation the right lower extremity.  Does have some wound dehiscence medially.  Some mild erythema laterally.  Right upper extremity dialysis access site.  Skin:    General: Skin is warm.  Neurological:     Mental Status: She is alert and oriented to person, place, and time.         ED Results / Procedures / Treatments   Labs (all labs ordered are listed, but only abnormal results are displayed) Labs Reviewed  COMPREHENSIVE METABOLIC PANEL - Abnormal; Notable for the following components:      Result Value   Potassium 6.0 (*)    CO2 17 (*)    BUN 49 (*)    Creatinine, Ser 3.46 (*)    Calcium 8.5 (*)    Total Protein 5.9 (*)    Albumin 1.6 (*)    AST 62 (*)    Alkaline Phosphatase 568 (*)    GFR calc non Af Amer 14 (*)    All other components within normal limits  CBC WITH DIFFERENTIAL/PLATELET - Abnormal; Notable for the following components:   WBC 14.1 (*)    RDW 19.1 (*)    Neutro Abs 13.2 (*)    Lymphs Abs 0.5 (*)    All other components within normal limits  CULTURE, BLOOD (ROUTINE X 2)  CULTURE, BLOOD (ROUTINE X 2)  SEDIMENTATION RATE  LACTIC ACID, PLASMA  LACTIC ACID, PLASMA    EKG None  Radiology DG Tibia/Fibula Right  Result Date: 07/15/2020 CLINICAL DATA:  Wound DKA EXAM: RIGHT TIBIA AND FIBULA - 2 VIEW COMPARISON:  05/01/2020 FINDINGS: Extensive vascular calcifications. Patient is status post below the knee amputation. Cut margins of the tibia and fibula appear smooth and without bony destructive change. Mild degenerative changes at the right knee. IMPRESSION: Status post below the knee amputation. No acute osseous abnormality. Electronically Signed   By: Donavan Foil M.D.   On: 07/04/2020 17:40    Procedures Procedures  (including critical care time)  Medications Ordered in ED Medications - No data to display  ED Course  I have reviewed the triage vital signs and the nursing notes.  Pertinent labs & imaging results that were available during my care of the patient were reviewed by me and considered in my medical decision making (see chart for details).    MDM Rules/Calculators/A&P                         Patient with hypotension and postsurgical wound.  Reportedly wound looks similar to what its been but patient's husband thought needed more treatment.  However found to be hypotensive.  Apparently does have history of hypertension chronically but also has had some normal blood pressures.  However creatinine has gone from 2.4 up to 3.4.  Potassium of 6.  Has had previous renal transplant.  Acute kidney injury may be secondary to hypotension and just generalized with illness.  Discussed with Dr. Carolin Sicks.  Would benefit from [facility] and gentle hydration and rechecking tomorrow.  If creatinine improves outpatient follow-up if continues to be elevated they can further evaluate at that time. Every discussed with Dr. Stann Mainland from orthopedic surgery.  Plan for closing of wound in 2 days.  White count is mildly elevated but normal sed rate when it has been previously elevated.  Off antibiotics.  Infection  considered but felt not to be the cause of severe hypotension at this time. Final Clinical Impression(s) / ED Diagnoses Final diagnoses:  AKI (acute kidney injury) ([place])  Hypotension, unspecified hypotension type    Rx / DC Orders ED Discharge Orders    None       Davonna Belling, MD 07/16/2020 1914

## 2020-07-08 ENCOUNTER — Encounter (HOSPITAL_COMMUNITY): Payer: Self-pay | Admitting: Physician Assistant

## 2020-07-08 DIAGNOSIS — I959 Hypotension, unspecified: Secondary | ICD-10-CM | POA: Diagnosis not present

## 2020-07-08 DIAGNOSIS — E872 Acidosis: Secondary | ICD-10-CM | POA: Diagnosis present

## 2020-07-08 DIAGNOSIS — E43 Unspecified severe protein-calorie malnutrition: Secondary | ICD-10-CM | POA: Diagnosis present

## 2020-07-08 DIAGNOSIS — D84821 Immunodeficiency due to drugs: Secondary | ICD-10-CM | POA: Diagnosis present

## 2020-07-08 DIAGNOSIS — I058 Other rheumatic mitral valve diseases: Secondary | ICD-10-CM | POA: Diagnosis not present

## 2020-07-08 DIAGNOSIS — L89322 Pressure ulcer of left buttock, stage 2: Secondary | ICD-10-CM | POA: Diagnosis present

## 2020-07-08 DIAGNOSIS — I70261 Atherosclerosis of native arteries of extremities with gangrene, right leg: Secondary | ICD-10-CM | POA: Diagnosis present

## 2020-07-08 DIAGNOSIS — T148XXA Other injury of unspecified body region, initial encounter: Secondary | ICD-10-CM | POA: Diagnosis not present

## 2020-07-08 DIAGNOSIS — N390 Urinary tract infection, site not specified: Secondary | ICD-10-CM | POA: Diagnosis not present

## 2020-07-08 DIAGNOSIS — Z8616 Personal history of COVID-19: Secondary | ICD-10-CM | POA: Diagnosis not present

## 2020-07-08 DIAGNOSIS — R64 Cachexia: Secondary | ICD-10-CM | POA: Diagnosis present

## 2020-07-08 DIAGNOSIS — Z7189 Other specified counseling: Secondary | ICD-10-CM | POA: Diagnosis not present

## 2020-07-08 DIAGNOSIS — Z66 Do not resuscitate: Secondary | ICD-10-CM | POA: Diagnosis not present

## 2020-07-08 DIAGNOSIS — E162 Hypoglycemia, unspecified: Secondary | ICD-10-CM | POA: Diagnosis not present

## 2020-07-08 DIAGNOSIS — K5900 Constipation, unspecified: Secondary | ICD-10-CM | POA: Diagnosis not present

## 2020-07-08 DIAGNOSIS — A498 Other bacterial infections of unspecified site: Secondary | ICD-10-CM | POA: Diagnosis not present

## 2020-07-08 DIAGNOSIS — N184 Chronic kidney disease, stage 4 (severe): Secondary | ICD-10-CM | POA: Diagnosis present

## 2020-07-08 DIAGNOSIS — Z681 Body mass index (BMI) 19 or less, adult: Secondary | ICD-10-CM | POA: Diagnosis not present

## 2020-07-08 DIAGNOSIS — T8781 Dehiscence of amputation stump: Secondary | ICD-10-CM | POA: Diagnosis present

## 2020-07-08 DIAGNOSIS — I4891 Unspecified atrial fibrillation: Secondary | ICD-10-CM | POA: Diagnosis not present

## 2020-07-08 DIAGNOSIS — N179 Acute kidney failure, unspecified: Secondary | ICD-10-CM | POA: Diagnosis present

## 2020-07-08 DIAGNOSIS — R571 Hypovolemic shock: Secondary | ICD-10-CM | POA: Diagnosis not present

## 2020-07-08 DIAGNOSIS — R54 Age-related physical debility: Secondary | ICD-10-CM | POA: Diagnosis present

## 2020-07-08 DIAGNOSIS — E875 Hyperkalemia: Secondary | ICD-10-CM | POA: Diagnosis present

## 2020-07-08 DIAGNOSIS — T8743 Infection of amputation stump, right lower extremity: Secondary | ICD-10-CM | POA: Diagnosis present

## 2020-07-08 DIAGNOSIS — Y830 Surgical operation with transplant of whole organ as the cause of abnormal reaction of the patient, or of later complication, without mention of misadventure at the time of the procedure: Secondary | ICD-10-CM | POA: Diagnosis present

## 2020-07-08 DIAGNOSIS — I361 Nonrheumatic tricuspid (valve) insufficiency: Secondary | ICD-10-CM | POA: Diagnosis not present

## 2020-07-08 DIAGNOSIS — B961 Klebsiella pneumoniae [K. pneumoniae] as the cause of diseases classified elsewhere: Secondary | ICD-10-CM | POA: Diagnosis present

## 2020-07-08 DIAGNOSIS — E8809 Other disorders of plasma-protein metabolism, not elsewhere classified: Secondary | ICD-10-CM | POA: Diagnosis not present

## 2020-07-08 DIAGNOSIS — D631 Anemia in chronic kidney disease: Secondary | ICD-10-CM | POA: Diagnosis present

## 2020-07-08 DIAGNOSIS — R112 Nausea with vomiting, unspecified: Secondary | ICD-10-CM | POA: Diagnosis not present

## 2020-07-08 DIAGNOSIS — N170 Acute kidney failure with tubular necrosis: Secondary | ICD-10-CM | POA: Diagnosis not present

## 2020-07-08 DIAGNOSIS — I340 Nonrheumatic mitral (valve) insufficiency: Secondary | ICD-10-CM | POA: Diagnosis not present

## 2020-07-08 DIAGNOSIS — Z515 Encounter for palliative care: Secondary | ICD-10-CM | POA: Diagnosis not present

## 2020-07-08 DIAGNOSIS — E870 Hyperosmolality and hypernatremia: Secondary | ICD-10-CM | POA: Diagnosis not present

## 2020-07-08 DIAGNOSIS — Y835 Amputation of limb(s) as the cause of abnormal reaction of the patient, or of later complication, without mention of misadventure at the time of the procedure: Secondary | ICD-10-CM | POA: Diagnosis present

## 2020-07-08 DIAGNOSIS — T8130XA Disruption of wound, unspecified, initial encounter: Secondary | ICD-10-CM | POA: Diagnosis not present

## 2020-07-08 DIAGNOSIS — T8619 Other complication of kidney transplant: Secondary | ICD-10-CM | POA: Diagnosis present

## 2020-07-08 DIAGNOSIS — E1152 Type 2 diabetes mellitus with diabetic peripheral angiopathy with gangrene: Secondary | ICD-10-CM | POA: Diagnosis present

## 2020-07-08 DIAGNOSIS — J150 Pneumonia due to Klebsiella pneumoniae: Secondary | ICD-10-CM | POA: Diagnosis not present

## 2020-07-08 DIAGNOSIS — B965 Pseudomonas (aeruginosa) (mallei) (pseudomallei) as the cause of diseases classified elsewhere: Secondary | ICD-10-CM | POA: Diagnosis present

## 2020-07-08 LAB — RENAL FUNCTION PANEL
Albumin: 1.4 g/dL — ABNORMAL LOW (ref 3.5–5.0)
Anion gap: 9 (ref 5–15)
BUN: 46 mg/dL — ABNORMAL HIGH (ref 6–20)
CO2: 18 mmol/L — ABNORMAL LOW (ref 22–32)
Calcium: 7.7 mg/dL — ABNORMAL LOW (ref 8.9–10.3)
Chloride: 112 mmol/L — ABNORMAL HIGH (ref 98–111)
Creatinine, Ser: 3.15 mg/dL — ABNORMAL HIGH (ref 0.44–1.00)
GFR calc non Af Amer: 15 mL/min — ABNORMAL LOW (ref >60)
Glucose, Bld: 68 mg/dL — ABNORMAL LOW (ref 70–99)
Phosphorus: 3.6 mg/dL (ref 2.5–4.6)
Potassium: 5.4 mmol/L — ABNORMAL HIGH (ref 3.5–5.1)
Sodium: 139 mmol/L (ref 135–145)

## 2020-07-08 LAB — CBC
HCT: 42.4 % (ref 36.0–46.0)
Hemoglobin: 13.2 g/dL (ref 12.0–15.0)
MCH: 29.2 pg (ref 26.0–34.0)
MCHC: 31.1 g/dL (ref 30.0–36.0)
MCV: 93.8 fL (ref 80.0–100.0)
Platelets: 158 10*3/uL (ref 150–400)
RBC: 4.52 MIL/uL (ref 3.87–5.11)
RDW: 19.3 % — ABNORMAL HIGH (ref 11.5–15.5)
WBC: 15.2 10*3/uL — ABNORMAL HIGH (ref 4.0–10.5)
nRBC: 0 % (ref 0.0–0.2)

## 2020-07-08 LAB — FIBRINOGEN: Fibrinogen: 207 mg/dL — ABNORMAL LOW (ref 210–475)

## 2020-07-08 LAB — TSH: TSH: 23.726 u[IU]/mL — ABNORMAL HIGH (ref 0.350–4.500)

## 2020-07-08 LAB — BASIC METABOLIC PANEL
Anion gap: 12 (ref 5–15)
BUN: 44 mg/dL — ABNORMAL HIGH (ref 6–20)
CO2: 18 mmol/L — ABNORMAL LOW (ref 22–32)
Calcium: 8.2 mg/dL — ABNORMAL LOW (ref 8.9–10.3)
Chloride: 111 mmol/L (ref 98–111)
Creatinine, Ser: 3.17 mg/dL — ABNORMAL HIGH (ref 0.44–1.00)
GFR calc non Af Amer: 15 mL/min — ABNORMAL LOW (ref >60)
Glucose, Bld: 89 mg/dL (ref 70–99)
Potassium: 5.6 mmol/L — ABNORMAL HIGH (ref 3.5–5.1)
Sodium: 141 mmol/L (ref 135–145)

## 2020-07-08 LAB — GLUCOSE, CAPILLARY
Glucose-Capillary: 64 mg/dL — ABNORMAL LOW (ref 70–99)
Glucose-Capillary: 91 mg/dL (ref 70–99)
Glucose-Capillary: 80 mg/dL (ref 70–99)
Glucose-Capillary: 76 mg/dL (ref 70–99)

## 2020-07-08 LAB — FERRITIN: Ferritin: 3390 ng/mL — ABNORMAL HIGH (ref 11–307)

## 2020-07-08 LAB — LACTATE DEHYDROGENASE: LDH: 206 U/L — ABNORMAL HIGH (ref 98–192)

## 2020-07-08 LAB — C-REACTIVE PROTEIN: CRP: 3.3 mg/dL — ABNORMAL HIGH (ref <1.0)

## 2020-07-08 LAB — D-DIMER, QUANTITATIVE: D-Dimer, Quant: 0.86 ug/mL-FEU — ABNORMAL HIGH (ref 0.00–0.50)

## 2020-07-08 MED ORDER — INSULIN ASPART 100 UNIT/ML ~~LOC~~ SOLN: 0.0000 [IU] | Freq: Every day | SUBCUTANEOUS | Status: DC

## 2020-07-08 MED ORDER — ADULT MULTIVITAMIN W/MINERALS CH
1.0000 | ORAL_TABLET | Freq: Every day | ORAL | Status: DC
  Administered 2020-07-08 – 2020-07-29 (×21): 1 via ORAL
  Filled 2020-07-08 (×21): qty 1

## 2020-07-08 MED ORDER — NAPHAZOLINE-GLYCERIN 0.012-0.2 % OP SOLN: 1.0000 [drp] | Freq: Four times a day (QID) | OPHTHALMIC | Status: DC | PRN

## 2020-07-08 MED ORDER — PANTOPRAZOLE SODIUM 40 MG PO TBEC
40.0000 mg | DELAYED_RELEASE_TABLET | Freq: Every day | ORAL | Status: DC
  Administered 2020-07-08 – 2020-07-29 (×21): 40 mg via ORAL
  Filled 2020-07-08 (×21): qty 1

## 2020-07-08 MED ORDER — VITAMIN D 25 MCG (1000 UNIT) PO TABS
1000.0000 [IU] | ORAL_TABLET | Freq: Every day | ORAL | Status: DC
  Administered 2020-07-08 – 2020-07-29 (×21): 1000 [IU] via ORAL
  Filled 2020-07-08 (×21): qty 1

## 2020-07-08 MED ORDER — MIRTAZAPINE 15 MG PO TABS
7.5000 mg | ORAL_TABLET | Freq: Every day | ORAL | Status: DC
  Administered 2020-07-08 – 2020-07-28 (×21): 7.5 mg via ORAL
  Filled 2020-07-08 (×21): qty 1

## 2020-07-08 MED ORDER — SODIUM CHLORIDE 0.9 % IV BOLUS
500.0000 mL | Freq: Once | INTRAVENOUS | Status: AC
  Administered 2020-07-08: 500 mL via INTRAVENOUS

## 2020-07-08 MED ORDER — ZINC SULFATE 220 (50 ZN) MG PO CAPS
220.0000 mg | ORAL_CAPSULE | Freq: Every day | ORAL | Status: DC
  Administered 2020-07-08 – 2020-07-29 (×21): 220 mg via ORAL
  Filled 2020-07-08 (×21): qty 1

## 2020-07-08 MED ORDER — SODIUM CHLORIDE 0.9 % IV SOLN: INTRAVENOUS | Status: DC

## 2020-07-08 MED ORDER — SODIUM BICARBONATE 650 MG PO TABS
650.0000 mg | ORAL_TABLET | Freq: Two times a day (BID) | ORAL | Status: DC
  Administered 2020-07-08 – 2020-07-11 (×7): 650 mg via ORAL
  Filled 2020-07-08 (×8): qty 1

## 2020-07-08 MED ORDER — ACETAMINOPHEN 325 MG PO TABS
650.0000 mg | ORAL_TABLET | Freq: Four times a day (QID) | ORAL | Status: DC | PRN
  Administered 2020-07-08: 650 mg via ORAL
  Filled 2020-07-08: qty 2

## 2020-07-08 MED ORDER — APIXABAN 5 MG PO TABS
5.0000 mg | ORAL_TABLET | Freq: Two times a day (BID) | ORAL | Status: DC
  Administered 2020-07-08 – 2020-07-09 (×3): 5 mg via ORAL
  Filled 2020-07-08 (×3): qty 1

## 2020-07-08 MED ORDER — ASCORBIC ACID 500 MG PO TABS
250.0000 mg | ORAL_TABLET | Freq: Two times a day (BID) | ORAL | Status: DC
  Administered 2020-07-08 – 2020-07-29 (×42): 250 mg via ORAL
  Filled 2020-07-08 (×42): qty 1

## 2020-07-08 MED ORDER — SENNOSIDES-DOCUSATE SODIUM 8.6-50 MG PO TABS
1.0000 | ORAL_TABLET | Freq: Every day | ORAL | Status: DC
  Administered 2020-07-08 – 2020-07-10 (×3): 1 via ORAL
  Filled 2020-07-08 (×3): qty 1

## 2020-07-08 MED ORDER — HYDROCORTISONE ACETATE 25 MG RE SUPP
25.0000 mg | Freq: Two times a day (BID) | RECTAL | Status: DC
  Administered 2020-07-08 – 2020-07-21 (×15): 25 mg via RECTAL
  Filled 2020-07-08 (×30): qty 1

## 2020-07-08 MED ORDER — FOLIC ACID 1 MG PO TABS
1.0000 mg | ORAL_TABLET | Freq: Every day | ORAL | Status: DC
  Administered 2020-07-08 – 2020-07-29 (×21): 1 mg via ORAL
  Filled 2020-07-08 (×21): qty 1

## 2020-07-08 MED ORDER — PREDNISONE 5 MG PO TABS
5.0000 mg | ORAL_TABLET | Freq: Every day | ORAL | Status: DC
  Administered 2020-07-08 – 2020-07-29 (×21): 5 mg via ORAL
  Filled 2020-07-08 (×23): qty 1

## 2020-07-08 MED ORDER — PATIROMER SORBITEX CALCIUM 8.4 G PO PACK
16.8000 g | PACK | Freq: Every day | ORAL | Status: DC
  Administered 2020-07-08 – 2020-07-10 (×2): 16.8 g via ORAL
  Filled 2020-07-08 (×7): qty 2

## 2020-07-08 MED ORDER — NORTRIPTYLINE HCL 25 MG PO CAPS
25.0000 mg | ORAL_CAPSULE | Freq: Every day | ORAL | Status: DC
  Administered 2020-07-08 – 2020-07-28 (×22): 25 mg via ORAL
  Filled 2020-07-08 (×22): qty 1

## 2020-07-08 MED ORDER — TACROLIMUS 1 MG PO CAPS
1.5000 mg | ORAL_CAPSULE | Freq: Two times a day (BID) | ORAL | Status: DC
  Administered 2020-07-08 – 2020-07-28 (×40): 1.5 mg via ORAL
  Filled 2020-07-08 (×42): qty 1

## 2020-07-08 MED ORDER — RAMELTEON 8 MG PO TABS
8.0000 mg | ORAL_TABLET | Freq: Every day | ORAL | Status: DC
  Administered 2020-07-08 – 2020-07-28 (×21): 8 mg via ORAL
  Filled 2020-07-08 (×22): qty 1

## 2020-07-08 MED ORDER — AMIODARONE HCL 200 MG PO TABS
200.0000 mg | ORAL_TABLET | Freq: Every day | ORAL | Status: DC
  Administered 2020-07-08 – 2020-07-29 (×21): 200 mg via ORAL
  Filled 2020-07-08 (×21): qty 1

## 2020-07-08 MED ORDER — HYDROXYZINE HCL 10 MG PO TABS
10.0000 mg | ORAL_TABLET | Freq: Three times a day (TID) | ORAL | Status: DC | PRN
  Administered 2020-07-09: 10 mg via ORAL
  Filled 2020-07-08 (×2): qty 1

## 2020-07-08 MED ORDER — DICYCLOMINE HCL 10 MG PO CAPS
10.0000 mg | ORAL_CAPSULE | Freq: Three times a day (TID) | ORAL | Status: DC | PRN
  Administered 2020-07-08 – 2020-07-09 (×2): 10 mg via ORAL
  Filled 2020-07-08 (×3): qty 1

## 2020-07-08 MED ORDER — SODIUM CHLORIDE 0.9 % IV BOLUS: 1000.0000 mL | Freq: Once | INTRAVENOUS | Status: DC

## 2020-07-08 MED ORDER — INSULIN ASPART 100 UNIT/ML ~~LOC~~ SOLN: 0.0000 [IU] | Freq: Three times a day (TID) | SUBCUTANEOUS | Status: DC

## 2020-07-08 MED ORDER — THIAMINE HCL 100 MG PO TABS
100.0000 mg | ORAL_TABLET | Freq: Every day | ORAL | Status: DC
  Administered 2020-07-08 – 2020-07-29 (×21): 100 mg via ORAL
  Filled 2020-07-08 (×21): qty 1

## 2020-07-08 NOTE — Progress Notes (Signed)
Inpatient Diabetes Program Recommendations  AACE/ADA: New Consensus Statement on Inpatient Glycemic Control (2015)  Target Ranges:  Prepandial:   less than 140 mg/dL      Peak postprandial:   less than 180 mg/dL (1-2 hours)      Critically ill patients:  140 - 180 mg/dL   Lab Results  Component Value Date   GLUCAP 119 (H) 06/01/2020   HGBA1C 4.1 (L) 05/02/2020    Review of Glycemic Control Results for Wanda [name], Wanda [name] (MRN [id number]) as of 07/08/2020 12:11  Ref. Range 07/08/2020 00:13  Glucose Latest Ref Range: 70 - 99 mg/dL 68 (L)   Diabetes history: Type 2 DM Outpatient Diabetes medications: none Current orders for Inpatient glycemic control: Novolog 0-9 units TID, Novolog 0-5 units QHS Prednisone 5 mg QD  Inpatient Diabetes Program Recommendations:    With renal status, may want to reduce correction to very sensitive scale: Novolog 0-6 units TID.   Thanks, Bronson Curb, MSN, RNC-OB Diabetes Coordinator [phone] (8a-5p)

## 2020-07-08 NOTE — ED Notes (Signed)
Patient resting, has no complaints at this time.  Reports being a little cold.  Repositioned patient and covered patient up with blankets.

## 2020-07-08 NOTE — ED Notes (Signed)
Spioke with [facility] and will look into covid positive.

## 2020-07-08 NOTE — ED Notes (Signed)
Low bp's noted in data validate are taken when pt crosses arms and holds arms up. When arm is repositioned, sbp is in 90's.

## 2020-07-08 NOTE — ED Notes (Signed)
Pt reports that she was covid positive at the rehab facility 2 weeks ago and has been off isolation since. Spoke to bed placement. Documentation of this is needed prior to bed assignment. Secretary to call [employer] on [address] to request faxed copy.

## 2020-07-08 NOTE — ED Notes (Signed)
Patient reports was diagnosed with COVID in rehab at [facility].  Reports she was quarantined for 14 days in rehab and was released from quarantine last Friday on 10/1.  Paging admitting MD so patient can get a bed.

## 2020-07-08 NOTE — ED Notes (Signed)
Called [facility] to get pt daily med list and most recent COVID result; they say since pt was discharged home, they cannot provide any records.

## 2020-07-08 NOTE — Progress Notes (Signed)
Wanda [name]  RUE:[id number] DOB: [birth year] DOA: 07/20/2020 PCP: Lorene Dy, MD    Brief Narrative:  59 year old with a history of paroxysmal atrial fibrillation on Eliquis, CKD stage IV status post renal transplant, anemia of chronic kidney disease, gastric sleeve, hypoalbuminemia, DM2, HTN, hypothyroidism, GERD, peripheral arterial disease, and chronic neuropathy who was admitted to the hospital 05/01/2020-06/01/2020 in septic shock secondary to a right foot ulcer with Klebsiella bacteremia complicated by endocarditis.  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  She returned to the ED 10/5 with a 1 week history of drainage and dehiscence of the stump site of her right lower extremity.  In the ED the patient was found to have an elevated WBC.  X-ray right tibia/fibula was without evidence of osseous abnormality.  She was found to be hyperkalemic at 6.0 and in acute renal failure with creatinine 3.4 up from baseline of 2.4.  The wound was discussed with orthopedics who apparently suggested follow-up in the office.  Significant Events:  7/30 > 8/30 admit right foot cellulitis and Klebsiella bacteremia > R BKA 10/5 admit w/ AKI, hyperkalemia, and wound dehiscence   Antimicrobials:  none  DVT prophylaxis: Eliquis   Subjective: Reports that she is feeling better overall.  Admits to very poor appetite and very limited intake for many weeks now.  States that she had primarily GI symptoms related to her recent recovered COVID including profuse diarrhea which is now resolved.  Denies current chest pain nausea vomiting abdominal pain or diarrhea.  Assessment & Plan:  R BKA stump site dehiscence X-ray without evidence to suggest osteomyelitis -finished a prolonged course of IV meropenem 9/25 -blood cultures pending -ED discussed with Dr. Stann Mainland orthopedic surgery who apparently recommended outpatient  follow-up for wound closure  Recent COVID Was diagnosed with Covid while at [facility] rehab center and quarantined for 14 days -last day of quarantine was 10/1 -CXR notes resolved bilateral lung opacities compared to previous exam -entirely asymptomatic from this regard -no indication for ongoing isolation  Acute kidney injury on CKD stage IV status post renal transplant Baseline creatinine 2.1-2.4 -creatinine 3.4 at presentation -nephrology reviewed and suggested volume resuscitation and close monitoring  Recent Labs  Lab 07/16/2020 1633 07/08/20 0013 07/08/20 1354  CREATININE 3.46* 3.15* 3.17*     Hyperkalemia Being treated with Lokelma -temporizing measures provided at point of admission  Chronic paroxysmal atrial fibrillation Continue home amiodarone and Eliquis  DM 2 - controlled but with chronic neuropathy Does not require home medical therapy -follow CBG  Hypothyroidism Reported history of such but is not on thyroid hormone replacement -follow-up TSH  Code Status: FULL CODE Family Communication:  Status is: Inpatient  Remains inpatient appropriate because:Persistent severe electrolyte disturbances   Dispo: The patient is from: SNF              Anticipated d/c is to: SNF              Anticipated d/c date is: 3 days              Patient currently is not medically stable to d/c.   Consultants:  none  Objective: Blood pressure (!) 84/40, pulse 84, temperature (!) 97.5 F (36.4 C), temperature source Oral, resp. rate 16, weight 50.9 kg, SpO2 100 %.  Intake/Output Summary (Last 24 hours) at 07/08/2020 1517 Last data filed at 07/08/2020 1106 Gross per 24 hour  Intake 50 ml  Output 100 ml  Net -50 ml   Filed Weights   07/08/20 1130  Weight: 50.9 kg    Examination: General: No acute respiratory distress Lungs: Clear to auscultation bilaterally without wheezes or crackles Cardiovascular: Regular rate and rhythm without murmur gallop or rub normal S1 and  S2 Abdomen: Nontender, nondistended, soft, bowel sounds positive, no rebound, no ascites, no appreciable mass Extremities: No significant cyanosis, clubbing, or edema bilateral lower extremities  CBC: Recent Labs  Lab 08/02/2020 1633 07/08/20 0013  WBC 14.1* 15.2*  NEUTROABS 13.2*  --   HGB 12.4 13.2  HCT 40.7 42.4  MCV 93.6 93.8  PLT 196 229   Basic Metabolic Panel: Recent Labs  Lab 07/06/2020 1633 07/06/2020 1633 07/30/2020 2122 07/08/20 0013 07/08/20 1354  NA 139  --   --  139 141  K 6.0*   < > 5.9* 5.4* 5.6*  CL 111  --   --  112* 111  CO2 17*  --   --  18* 18*  GLUCOSE 81  --   --  68* 89  BUN 49*  --   --  46* 44*  CREATININE 3.46*  --   --  3.15* 3.17*  CALCIUM 8.5*  --   --  7.7* 8.2*  MG  --   --  2.1  --   --   PHOS  --   --   --  3.6  --    < > = values in this interval not displayed.   GFR: Estimated Creatinine Clearance: 15.4 mL/min (A) (by C-G formula based on SCr of 3.17 mg/dL (H)).  Liver Function Tests: Recent Labs  Lab 07/16/2020 1633 07/08/20 0013  AST 62*  --   ALT 30  --   ALKPHOS 568*  --   BILITOT 0.9  --   PROT 5.9*  --   ALBUMIN 1.6* 1.4*    HbA1C: Hgb A1c MFr Bld  Date/Time Value Ref Range Status  05/02/2020 01:06 AM 4.1 (L) 4.8 - 5.6 % Final    Comment:    (NOTE) Pre diabetes:          5.7%-6.4%  Diabetes:              >6.4%  Glycemic control for   <7.0% adults with diabetes   02/05/2020 06:33 AM 5.2 4.8 - 5.6 % Final    Comment:    (NOTE) Pre diabetes:          5.7%-6.4% Diabetes:              >6.4% Glycemic control for   <7.0% adults with diabetes     CBG: Recent Labs  Lab 07/08/20 1302 07/08/20 1322  GLUCAP 64* 91    Recent Results (from the past 240 hour(s))  Culture, blood (routine x 2)     Status: None (Preliminary result)   Collection Time: 07/04/2020  5:00 PM   Specimen: BLOOD  Result Value Ref Range Status   Specimen Description BLOOD LEFT ANTECUBITAL  Final   Special Requests   Final    BOTTLES DRAWN  AEROBIC AND ANAEROBIC Blood Culture results may not be optimal due to an inadequate volume of blood received in culture bottles   Culture   Final    NO GROWTH < 24 HOURS Performed at [facility], [place] [address], [place], [place] [address]    Report Status PENDING  Incomplete  Culture, blood (routine x 2)  Status: None (Preliminary result)   Collection Time: 07/16/2020  6:06 PM   Specimen: BLOOD  Result Value Ref Range Status   Specimen Description BLOOD LEFT ANTECUBITAL  Final   Special Requests   Final    BOTTLES DRAWN AEROBIC AND ANAEROBIC Blood Culture adequate volume   Culture   Final    NO GROWTH < 24 HOURS Performed at [facility], [address], [place], [place] [address]    Report Status PENDING  Incomplete  Respiratory Panel by RT PCR (Flu A&B, Covid) - Nasopharyngeal Swab     Status: Abnormal   Collection Time: 07/23/2020  9:00 PM   Specimen: Nasopharyngeal Swab  Result Value Ref Range Status   SARS Coronavirus 2 by RT PCR POSITIVE (A) NEGATIVE Final    Comment: RESULT CALLED TO, READ BACK BY AND VERIFIED WITH: B ROUNDTREE RN 07/03/2020 2217 JDW (NOTE) SARS-CoV-2 target nucleic acids are DETECTED.  SARS-CoV-2 RNA is generally detectable in upper respiratory specimens  during the acute phase of infection. Positive results are indicative of the presence of the identified virus, but do not rule out bacterial infection or co-infection with other pathogens not detected by the test. Clinical correlation with patient history and other diagnostic information is necessary to determine patient infection status. The expected result is Negative.  Fact Sheet for Patients:  PinkCheek.be  Fact Sheet for Healthcare Providers: GravelBags.it  This test is not yet approved or cleared by the [place] FDA and  has been authorized for detection and/or diagnosis of SARS-CoV-2 by FDA under an Emergency Use  Authorization (EUA).  This EUA will remain in effect (meaning this test can be use d) for the duration of  the COVID-19 declaration under Section 564(b)(1) of the Act, 21 U.S.C. section 360bbb-3(b)(1), unless the authorization is terminated or revoked sooner.      Influenza A by PCR NEGATIVE NEGATIVE Final   Influenza B by PCR NEGATIVE NEGATIVE Final    Comment: (NOTE) The Xpert Xpress SARS-CoV-2/FLU/RSV assay is intended as an aid in  the diagnosis of influenza from Nasopharyngeal swab specimens and  should not be used as a sole basis for treatment. Nasal washings and  aspirates are unacceptable for Xpert Xpress SARS-CoV-2/FLU/RSV  testing.  Fact Sheet for Patients: PinkCheek.be  Fact Sheet for Healthcare Providers: GravelBags.it  This test is not yet approved or cleared by the [place] FDA and  has been authorized for detection and/or diagnosis of SARS-CoV-2 by  FDA under an Emergency Use Authorization (EUA). This EUA will remain  in effect (meaning this test can be used) for the duration of the  Covid-19 declaration under Section 564(b)(1) of the Act, 21  U.S.C. section 360bbb-3(b)(1), unless the authorization is  terminated or revoked. Performed at [facility], [place] [address] [place], [place] [address]      Scheduled Meds: . amiodarone  200 mg Oral Daily  . apixaban  5 mg Oral Q12H  . ascorbic acid  250 mg Oral BID  . cholecalciferol  1,000 Units Oral Daily  . folic acid  1 mg Oral Daily  . hydrocortisone  25 mg Rectal BID  . insulin aspart  0-5 Units Subcutaneous QHS  . insulin aspart  0-9 Units Subcutaneous TID WC  . mirtazapine  7.5 mg Oral QHS  . multivitamin with minerals  1 tablet Oral Daily  . nortriptyline  25 mg Oral QHS  . pantoprazole  40 mg Oral Daily  . patiromer  16.8 g  Oral Daily  . predniSONE  5 mg Oral Daily  . ramelteon  8 mg Oral QHS  . senna-docusate  1 tablet Oral Daily    . sodium bicarbonate  650 mg Oral BID  . tacrolimus  1.5 mg Oral Q12H  . thiamine  100 mg Oral Daily  . zinc sulfate  220 mg Oral Daily   Continuous Infusions: . sodium chloride 125 mL/hr at 07/08/20 1020     LOS: 0 days   Cherene Altes, MD [facility] Office  [phone] Pager - Text Page per Shea Evans  If 7PM-7AM, please contact night-coverage per Amion 07/08/2020, 3:17 PM

## 2020-07-08 NOTE — ED Notes (Signed)
Patient reports warming up and is ok.  Resting comfortably.

## 2020-07-08 NOTE — ED Notes (Signed)
Attempted report 

## 2020-07-08 NOTE — ED Notes (Signed)
Paged MD about patients BP and complaining of pain to stump. Awaiting for response.

## 2020-07-08 NOTE — Consult Note (Signed)
WOC Nurse Consult Note: Patient receiving care in [facility] ED 14.  I communicated with Dr. Thereasa Solo via [facility] in regards to plan of care for patient's wound. Reason for Consult: stump site wound dehiscence Consult completed remotely after review of record. It appears patient is scheduled for OR for stump revision. Wound type: as above Pressure Injury POA: Yes/No/NA Measurement: na Wound bed: see photo Drainage (amount, consistency, odor) see photo Periwound: erythematous Dressing procedure/placement/frequency:  Place dry gauze or ABD pad over right stump wound. Change as needed for drainage, but at least every shift. Moisten dressing with saline, if needed, to remove.  Thank you for the consult.  [facility] nurse will not follow at this time.  Please re-consult the [place] team if needed.  Val Riles, RN, MSN, CWOCN, CNS-BC, pager [phone]

## 2020-07-09 ENCOUNTER — Encounter (HOSPITAL_COMMUNITY): Payer: Self-pay | Admitting: Internal Medicine

## 2020-07-09 ENCOUNTER — Inpatient Hospital Stay (HOSPITAL_COMMUNITY): Admission: RE | Admit: 2020-07-09 | Payer: Medicare HMO | Source: Home / Self Care | Admitting: Orthopedic Surgery

## 2020-07-09 ENCOUNTER — Encounter (HOSPITAL_COMMUNITY): Admission: EM | Disposition: E | Payer: Self-pay | Source: Home / Self Care | Attending: Internal Medicine

## 2020-07-09 DIAGNOSIS — T8130XA Disruption of wound, unspecified, initial encounter: Secondary | ICD-10-CM | POA: Diagnosis not present

## 2020-07-09 LAB — COMPREHENSIVE METABOLIC PANEL
ALT: 34 U/L (ref 0–44)
AST: 52 U/L — ABNORMAL HIGH (ref 15–41)
Albumin: 1.3 g/dL — ABNORMAL LOW (ref 3.5–5.0)
Alkaline Phosphatase: 547 U/L — ABNORMAL HIGH (ref 38–126)
Anion gap: 8 (ref 5–15)
BUN: 43 mg/dL — ABNORMAL HIGH (ref 6–20)
CO2: 19 mmol/L — ABNORMAL LOW (ref 22–32)
Calcium: 8.1 mg/dL — ABNORMAL LOW (ref 8.9–10.3)
Chloride: 115 mmol/L — ABNORMAL HIGH (ref 98–111)
Creatinine, Ser: 3.08 mg/dL — ABNORMAL HIGH (ref 0.44–1.00)
GFR calc non Af Amer: 16 mL/min — ABNORMAL LOW (ref >60)
Glucose, Bld: 80 mg/dL (ref 70–99)
Potassium: 5.5 mmol/L — ABNORMAL HIGH (ref 3.5–5.1)
Sodium: 142 mmol/L (ref 135–145)
Total Bilirubin: 0.8 mg/dL (ref 0.3–1.2)
Total Protein: 5.1 g/dL — ABNORMAL LOW (ref 6.5–8.1)

## 2020-07-09 LAB — GLUCOSE, CAPILLARY
Glucose-Capillary: 121 mg/dL — ABNORMAL HIGH (ref 70–99)
Glucose-Capillary: 57 mg/dL — ABNORMAL LOW (ref 70–99)
Glucose-Capillary: 103 mg/dL — ABNORMAL HIGH (ref 70–99)
Glucose-Capillary: 88 mg/dL (ref 70–99)
Glucose-Capillary: 80 mg/dL (ref 70–99)
Glucose-Capillary: 100 mg/dL — ABNORMAL HIGH (ref 70–99)

## 2020-07-09 LAB — CBC
HCT: 33 % — ABNORMAL LOW (ref 36.0–46.0)
Hemoglobin: 10.4 g/dL — ABNORMAL LOW (ref 12.0–15.0)
MCH: 28.8 pg (ref 26.0–34.0)
MCHC: 31.5 g/dL (ref 30.0–36.0)
MCV: 91.4 fL (ref 80.0–100.0)
Platelets: 185 10*3/uL (ref 150–400)
RBC: 3.61 MIL/uL — ABNORMAL LOW (ref 3.87–5.11)
RDW: 19.1 % — ABNORMAL HIGH (ref 11.5–15.5)
WBC: 12.6 10*3/uL — ABNORMAL HIGH (ref 4.0–10.5)
nRBC: 0 % (ref 0.0–0.2)

## 2020-07-09 LAB — HEMOGLOBIN A1C
Hgb A1c MFr Bld: 4.2 % — ABNORMAL LOW (ref 4.8–5.6)
Mean Plasma Glucose: 73.84 mg/dL

## 2020-07-09 LAB — MAGNESIUM: Magnesium: 2.1 mg/dL (ref 1.7–2.4)

## 2020-07-09 LAB — MRSA PCR SCREENING: MRSA by PCR: NEGATIVE

## 2020-07-09 SURGERY — AMPUTATION BELOW KNEE
Anesthesia: General | Site: Knee | Laterality: Right

## 2020-07-09 MED ORDER — DEXTROSE 50 % IV SOLN
0.5000 | INTRAVENOUS | Status: DC | PRN
  Administered 2020-07-14 (×2): 25 mL via INTRAVENOUS
  Administered 2020-07-20: 50 mL via INTRAVENOUS
  Administered 2020-07-26: 25 mL via INTRAVENOUS
  Administered 2020-07-28: 50 mL via INTRAVENOUS
  Filled 2020-07-09 (×4): qty 50

## 2020-07-09 MED ORDER — MIDAZOLAM HCL 2 MG/2ML IJ SOLN
INTRAMUSCULAR | Status: AC
  Filled 2020-07-09: qty 2

## 2020-07-09 MED ORDER — APIXABAN 2.5 MG PO TABS
2.5000 mg | ORAL_TABLET | Freq: Two times a day (BID) | ORAL | Status: AC
  Administered 2020-07-10 – 2020-07-11 (×5): 2.5 mg via ORAL
  Filled 2020-07-09 (×5): qty 1

## 2020-07-09 MED ORDER — MORPHINE SULFATE (PF) 2 MG/ML IV SOLN
1.0000 mg | INTRAVENOUS | Status: DC | PRN
  Administered 2020-07-15: 2 mg via INTRAVENOUS
  Administered 2020-07-15: 1 mg via INTRAVENOUS
  Filled 2020-07-09 (×2): qty 1

## 2020-07-09 MED ORDER — OXYCODONE HCL 5 MG PO TABS
5.0000 mg | ORAL_TABLET | ORAL | Status: DC | PRN
  Administered 2020-07-09 – 2020-07-13 (×5): 5 mg via ORAL
  Administered 2020-07-14 – 2020-07-16 (×4): 10 mg via ORAL
  Filled 2020-07-09: qty 2
  Filled 2020-07-09: qty 1
  Filled 2020-07-09: qty 2
  Filled 2020-07-09 (×3): qty 1
  Filled 2020-07-09: qty 2
  Filled 2020-07-09: qty 1
  Filled 2020-07-09: qty 2

## 2020-07-09 MED ORDER — SODIUM CHLORIDE 0.9 % IV SOLN: INTRAVENOUS | Status: DC

## 2020-07-09 MED ORDER — PROPOFOL 10 MG/ML IV BOLUS
INTRAVENOUS | Status: AC
  Filled 2020-07-09: qty 20

## 2020-07-09 MED ORDER — TRAMADOL HCL 50 MG PO TABS
50.0000 mg | ORAL_TABLET | Freq: Four times a day (QID) | ORAL | Status: DC | PRN
  Administered 2020-07-10 – 2020-07-29 (×18): 50 mg via ORAL
  Filled 2020-07-09 (×18): qty 1

## 2020-07-09 MED ORDER — DIPHENHYDRAMINE HCL 50 MG/ML IJ SOLN
25.0000 mg | Freq: Four times a day (QID) | INTRAMUSCULAR | Status: DC | PRN
  Administered 2020-07-09 – 2020-07-12 (×3): 25 mg via INTRAVENOUS
  Filled 2020-07-09 (×3): qty 1

## 2020-07-09 MED ORDER — DIPHENHYDRAMINE HCL 50 MG/ML IJ SOLN
12.5000 mg | Freq: Four times a day (QID) | INTRAMUSCULAR | Status: DC | PRN
  Administered 2020-07-09: 12.5 mg via INTRAVENOUS
  Filled 2020-07-09: qty 1

## 2020-07-09 MED ORDER — OXYCODONE-ACETAMINOPHEN 5-325 MG PO TABS
1.0000 | ORAL_TABLET | Freq: Once | ORAL | Status: AC
  Administered 2020-07-09: 1 via ORAL
  Filled 2020-07-09: qty 1

## 2020-07-09 MED ORDER — SODIUM CHLORIDE 0.9 % IV BOLUS
500.0000 mL | Freq: Once | INTRAVENOUS | Status: AC
  Administered 2020-07-09: 500 mL via INTRAVENOUS

## 2020-07-09 MED ORDER — DEXTROSE 50 % IV SOLN
INTRAVENOUS | Status: AC
  Administered 2020-07-09: 25 mL via INTRAVENOUS
  Filled 2020-07-09: qty 50

## 2020-07-09 MED ORDER — FENTANYL CITRATE (PF) 250 MCG/5ML IJ SOLN
INTRAMUSCULAR | Status: AC
  Filled 2020-07-09: qty 5

## 2020-07-09 NOTE — Progress Notes (Signed)
Pt returned to unit, no distress noted.

## 2020-07-09 NOTE — Consult Note (Signed)
Reason for Consult:  Right bka wound dehiscence Referring Physician: Dr. Julianne [name] is an 59 y.o. female.  HPI: Wanda [name] is a 59 year old female with a past medical history significant for diabetes and chronic kidney disease status post renal transplant.  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  She was seen in the office last week and was scheduled for surgery today for elective debridement and closure of the area of wound dehiscence.  In the interim she was admitted to the hospital for renal failure.  She denies any fever or chills but notes drainage from the wound that has been persistent.  She takes Eliquis.  She last ate this morning.  Past Medical History:  Diagnosis Date  . Anemia   . Arthritis    "hands" (07/24/2013) pt denies  . Chronic kidney disease, stage IV (severe) ([place])    "never went on dialysis" (07/24/2013)  . Diabetes mellitus type II   . Diabetic peripheral neuropathy ([place])   . Dialysis patient [facility])    Monday Wednesday Friday   . Dyspnea on exertion    pt denies  . Foot pain   . GERD (gastroesophageal reflux disease)   . Gout   . History of chicken pox   . History of measles   . History of mumps   . Hyperlipidemia   . Hypothyroidism   . Migraines    "q 3 months" (07/24/2013)  . Nonspecific abnormal unspecified cardiovascular function study   . Obesity   . PAD (peripheral artery disease) ([place])   . Peripheral neuropathy   . Peripheral vascular disease ([place])   . Pneumonia 09/2012  . Renal transplant disorder   . Shingles   . Sleep apnea    "getting ready to get tested again cause dr says I have it" (07/24/2013) pt states was tested and was told she did not have sleep apnea / no cpap  . Unspecified essential hypertension     Past Surgical History:  Procedure Laterality Date  . AMPUTATION Right 05/07/2020   Procedure: AMPUTATION BELOW KNEE;   Surgeon: Wanda Simmer, MD;  Location: [facility];  Service: Orthopedics;  Laterality: Right;  . AV FISTULA PLACEMENT Right 2000   "lower arm" (07/24/2013)  . AV FISTULA PLACEMENT, RADIOCEPHALIC Right 5638   "upper arm" (07/24/2013)  . CAPD REMOVAL N/A 10/10/2016   Procedure: CONTINUOUS AMBULATORY PERITONEAL DIALYSIS  (CAPD) CATHETER REMOVAL;  Surgeon: Donnie Mesa, MD;  Location: [facility];  Service: General;  Laterality: N/A;  . CATARACT EXTRACTION W/ INTRAOCULAR LENS  IMPLANT, BILATERAL Bilateral 2011  . [place]; 1987  . CHOLECYSTECTOMY  2011  . COLONOSCOPY    . ENDOMETRIAL CRYOABLATION  1980's  . INTRAUTERINE DEVICE (IUD) INSERTION  2011  . LAPAROSCOPIC GASTRIC SLEEVE RESECTION N/A 05/26/2015   Procedure: LAPAROSCOPIC GASTRIC SLEEVE RESECTION;  Surgeon: Excell Seltzer, MD;  Location: [facility];  Service: General;  Laterality: N/A;  . TRANSPLANTATION RENAL  2013   "right" (09/04/2012)  . TUBAL LIGATION  1987  . [employer]   "RLE; stripped" (09/04/2012)  . VIDEO BRONCHOSCOPY  09/07/2012   Procedure: VIDEO BRONCHOSCOPY WITHOUT FLUORO;  Surgeon: Chesley Mires, MD;  Location: [facility]COPY;  Service: Cardiopulmonary;  Laterality: Bilateral;    Family History  Problem Relation Age of Onset  . Colon cancer Father        deceased at age 31  .  Diabetes Brother   . Neuropathy Neg Hx     Social History:  reports that she has never smoked. She has never used smokeless tobacco. She reports that she does not drink alcohol and does not use drugs.  Allergies:  Allergies  Allergen Reactions  . Propoxyphene N-Acetaminophen Other (See Comments)    Severe headache  . Hydrocodone-Acetaminophen Other (See Comments)     Severe headache   . Astemizole Rash and Other (See Comments)    headache   . Cephalosporins Rash    Medications: I have reviewed the patient's current medications.  Results for orders placed or performed during the hospital encounter of 08/01/2020 (from the  past 48 hour(s))  Comprehensive metabolic panel     Status: Abnormal   Collection Time: 08/01/2020  4:33 PM  Result Value Ref Range   Sodium 139 135 - 145 mmol/L   Potassium 6.0 (H) 3.5 - 5.1 mmol/L   Chloride 111 98 - 111 mmol/L   CO2 17 (L) 22 - 32 mmol/L   Glucose, Bld 81 70 - 99 mg/dL    Comment: Glucose reference range applies only to samples taken after fasting for at least 8 hours.   BUN 49 (H) 6 - 20 mg/dL   Creatinine, Ser 3.46 (H) 0.44 - 1.00 mg/dL   Calcium 8.5 (L) 8.9 - 10.3 mg/dL   Total Protein 5.9 (L) 6.5 - 8.1 g/dL   Albumin 1.6 (L) 3.5 - 5.0 g/dL   AST 62 (H) 15 - 41 U/L   ALT 30 0 - 44 U/L   Alkaline Phosphatase 568 (H) 38 - 126 U/L   Total Bilirubin 0.9 0.3 - 1.2 mg/dL   GFR calc non Af Amer 14 (L) >60 mL/min   Anion gap 11 5 - 15    Comment: Performed at [facility] [address], [place], [place] [address]  CBC with Differential     Status: Abnormal   Collection Time: 07/27/2020  4:33 PM  Result Value Ref Range   WBC 14.1 (H) 4.0 - 10.5 K/uL   RBC 4.35 3.87 - 5.11 MIL/uL   Hemoglobin 12.4 12.0 - 15.0 g/dL   HCT 40.7 36 - 46 %   MCV 93.6 80.0 - 100.0 fL   MCH 28.5 26.0 - 34.0 pg   MCHC 30.5 30.0 - 36.0 g/dL   RDW 19.1 (H) 11.5 - 15.5 %   Platelets 196 150 - 400 K/uL   nRBC 0.0 0.0 - 0.2 %   Neutrophils Relative % 93 %   Neutro Abs 13.2 (H) 1.7 - 7.7 K/uL   Lymphocytes Relative 4 %   Lymphs Abs 0.5 (L) 0.7 - 4.0 K/uL   Monocytes Relative 2 %   Monocytes Absolute 0.3 0.1 - 1.0 K/uL   Eosinophils Relative 0 %   Eosinophils Absolute 0.0 0 - 0 K/uL   Basophils Relative 0 %   Basophils Absolute 0.0 0 - 0 K/uL   Immature Granulocytes 1 %   Abs Immature Granulocytes 0.07 0.00 - 0.07 K/uL    Comment: Performed at [facility], [place] [address], [place], [place] [address]  Sedimentation rate     Status: None   Collection Time: 07/20/2020  4:33 PM  Result Value Ref Range   Sed Rate 10 0 - 22 mm/hr    Comment: Performed at [facility],  [place] [address] [place], [place] [address]  Lactic acid, plasma     Status: None  Collection Time: 07/09/2020  4:34 PM  Result Value Ref Range   Lactic Acid, Venous 1.3 0.5 - 1.9 mmol/L    Comment: Performed at [facility], [place] [address] [place], [place] [address]  Culture, blood (routine x 2)     Status: None (Preliminary result)   Collection Time: 07/09/2020  5:00 PM   Specimen: BLOOD  Result Value Ref Range   Specimen Description BLOOD LEFT ANTECUBITAL    Special Requests      BOTTLES DRAWN AEROBIC AND ANAEROBIC Blood Culture results may not be optimal due to an inadequate volume of blood received in culture bottles   Culture      NO GROWTH < 24 HOURS Performed at [facility] [address], [place], [place] [address]    Report Status PENDING   Culture, blood (routine x 2)     Status: None (Preliminary result)   Collection Time: 07/17/2020  6:06 PM   Specimen: BLOOD  Result Value Ref Range   Specimen Description BLOOD LEFT ANTECUBITAL    Special Requests      BOTTLES DRAWN AEROBIC AND ANAEROBIC Blood Culture adequate volume   Culture      NO GROWTH < 24 HOURS Performed at [facility], [place] [address], [place], [place] [address]    Report Status PENDING   Lactic acid, plasma     Status: None   Collection Time: 07/03/2020  6:06 PM  Result Value Ref Range   Lactic Acid, Venous 1.0 0.5 - 1.9 mmol/L    Comment: Performed at [facility], [place] [address], [place], [place] [address]  Respiratory Panel by RT PCR (Flu A&B, Covid) - Nasopharyngeal Swab     Status: Abnormal   Collection Time: 07/13/2020  9:00 PM   Specimen: Nasopharyngeal Swab  Result Value Ref Range   SARS Coronavirus 2 by RT PCR POSITIVE (A) NEGATIVE    Comment: RESULT CALLED TO, READ BACK BY AND VERIFIED WITH: B ROUNDTREE RN 07/12/2020 2217 JDW (NOTE) SARS-CoV-2 target nucleic acids are DETECTED.  SARS-CoV-2 RNA is generally detectable in upper respiratory specimens  during the acute phase of  infection. Positive results are indicative of the presence of the identified virus, but do not rule out bacterial infection or co-infection with other pathogens not detected by the test. Clinical correlation with patient history and other diagnostic information is necessary to determine patient infection status. The expected result is Negative.  Fact Sheet for Patients:  PinkCheek.be  Fact Sheet for Healthcare Providers: GravelBags.it  This test is not yet approved or cleared by the [place] FDA and  has been authorized for detection and/or diagnosis of SARS-CoV-2 by FDA under an Emergency Use Authorization (EUA).  This EUA will remain in effect (meaning this test can be use d) for the duration of  the COVID-19 declaration under Section 564(b)(1) of the Act, 21 U.S.C. section 360bbb-3(b)(1), unless the authorization is terminated or revoked sooner.      Influenza A by PCR NEGATIVE NEGATIVE   Influenza B by PCR NEGATIVE NEGATIVE    Comment: (NOTE) The Xpert Xpress SARS-CoV-2/FLU/RSV assay is intended as an aid in  the diagnosis of influenza from Nasopharyngeal swab specimens and  should not be used as a sole basis for treatment. Nasal washings and  aspirates are unacceptable for Xpert Xpress SARS-CoV-2/FLU/RSV  testing.  Fact Sheet for Patients: PinkCheek.be  Fact Sheet for Healthcare Providers: GravelBags.it  This test is not yet approved or cleared  by the [place] and  has been authorized for detection and/or diagnosis of SARS-CoV-2 by  FDA under an Emergency Use Authorization (EUA). This EUA will remain  in effect (meaning this test can be used) for the duration of the  Covid-19 declaration under Section 564(b)(1) of the Act, 21  U.S.C. section 360bbb-3(b)(1), unless the authorization is  terminated or revoked. Performed at [facility], [place] [address], [place], [place] [address]   Potassium     Status: Abnormal   Collection Time: 07/28/2020  9:22 PM  Result Value Ref Range   Potassium 5.9 (H) 3.5 - 5.1 mmol/L    Comment: Performed at [facility] [address] [place], [place] [address]  Magnesium     Status: None   Collection Time: 07/31/2020  9:22 PM  Result Value Ref Range   Magnesium 2.1 1.7 - 2.4 mg/dL    Comment: Performed at [facility] [address], [place], [place] [address]  Renal function panel     Status: Abnormal   Collection Time: 07/08/20 12:13 AM  Result Value Ref Range   Sodium 139 135 - 145 mmol/L   Potassium 5.4 (H) 3.5 - 5.1 mmol/L   Chloride 112 (H) 98 - 111 mmol/L   CO2 18 (L) 22 - 32 mmol/L   Glucose, Bld 68 (L) 70 - 99 mg/dL    Comment: Glucose reference range applies only to samples taken after fasting for at least 8 hours.   BUN 46 (H) 6 - 20 mg/dL   Creatinine, Ser 3.15 (H) 0.44 - 1.00 mg/dL   Calcium 7.7 (L) 8.9 - 10.3 mg/dL   Phosphorus 3.6 2.5 - 4.6 mg/dL   Albumin 1.4 (L) 3.5 - 5.0 g/dL   GFR calc non Af Amer 15 (L) >60 mL/min   Anion gap 9 5 - 15    Comment: Performed at [facility] [address] [place], [place] [address]  CBC     Status: Abnormal   Collection Time: 07/08/20 12:13 AM  Result Value Ref Range   WBC 15.2 (H) 4.0 - 10.5 K/uL   RBC 4.52 3.87 - 5.11 MIL/uL   Hemoglobin 13.2 12.0 - 15.0 g/dL   HCT 42.4 36 - 46 %   MCV 93.8 80.0 - 100.0 fL   MCH 29.2 26.0 - 34.0 pg   MCHC 31.1 30.0 - 36.0 g/dL   RDW 19.3 (H) 11.5 - 15.5 %   Platelets 158 150 - 400 K/uL   nRBC 0.0 0.0 - 0.2 %    Comment: Performed at [facility], [place] [address], [place], [place] [address]  TSH     Status: Abnormal   Collection Time: 07/08/20 12:13 AM  Result Value Ref Range   TSH 23.726 (H) 0.350 - 4.500 uIU/mL    Comment: Performed by a 3rd Generation assay with a functional sensitivity of <=0.01 uIU/mL. Performed at [facility], [place] [address]  [place], [place] [address]   Ferritin     Status: Abnormal   Collection Time: 07/08/20 12:13 AM  Result Value Ref Range   Ferritin 3,390 (H) 11 - 307 ng/mL    Comment: Performed at [facility], [place] [address], [place],  [address]  Fibrinogen     Status: Abnormal   Collection Time: 07/08/20 12:13 AM  Result Value Ref Range   Fibrinogen 207 (L) 210 - 475 mg/dL    Comment: Performed  at [facility], [place] [address] [place], [place] [address]  D-dimer, quantitative (not at [facility])     Status: Abnormal   Collection Time: 07/08/20 12:13 AM  Result Value Ref Range   D-Dimer, Quant 0.86 (H) 0.00 - 0.50 ug/mL-FEU    Comment: (NOTE) At the manufacturer cut-off of 0.50 ug/mL FEU, this assay has been documented to exclude PE with a sensitivity and negative predictive value of 97 to 99%.  At this time, this assay has not been approved by the FDA to exclude DVT/VTE. Results should be correlated with clinical presentation. Performed at [facility], [place] [address], [place], [place] [address]   C-reactive protein     Status: Abnormal   Collection Time: 07/08/20 12:13 AM  Result Value Ref Range   CRP 3.3 (H) <1.0 mg/dL    Comment: Performed at [facility] [address], [place], [place] [address]  Lactate dehydrogenase     Status: Abnormal   Collection Time: 07/08/20 12:13 AM  Result Value Ref Range   LDH 206 (H) 98 - 192 U/L    Comment: Performed at [facility] [address], [place], [place] [address]  Glucose, capillary     Status: Abnormal   Collection Time: 07/08/20  1:02 PM  Result Value Ref Range   Glucose-Capillary 64 (L) 70 - 99 mg/dL    Comment: Glucose reference range applies only to samples taken after fasting for at least 8 hours.  Glucose, capillary     Status: None   Collection Time: 07/08/20  1:22 PM  Result Value Ref Range   Glucose-Capillary 91 70 - 99 mg/dL    Comment: Glucose reference range applies only to samples taken after fasting  for at least 8 hours.  Basic metabolic panel     Status: Abnormal   Collection Time: 07/08/20  1:54 PM  Result Value Ref Range   Sodium 141 135 - 145 mmol/L   Potassium 5.6 (H) 3.5 - 5.1 mmol/L   Chloride 111 98 - 111 mmol/L   CO2 18 (L) 22 - 32 mmol/L   Glucose, Bld 89 70 - 99 mg/dL    Comment: Glucose reference range applies only to samples taken after fasting for at least 8 hours.   BUN 44 (H) 6 - 20 mg/dL   Creatinine, Ser 3.17 (H) 0.44 - 1.00 mg/dL   Calcium 8.2 (L) 8.9 - 10.3 mg/dL   GFR calc non Af Amer 15 (L) >60 mL/min   Anion gap 12 5 - 15    Comment: Performed at [facility] [address] [place], [place] [address]  Glucose, capillary     Status: None   Collection Time: 07/08/20  5:12 PM  Result Value Ref Range   Glucose-Capillary 80 70 - 99 mg/dL    Comment: Glucose reference range applies only to samples taken after fasting for at least 8 hours.  Glucose, capillary     Status: None   Collection Time: 07/08/20  8:09 PM  Result Value Ref Range   Glucose-Capillary 76 70 - 99 mg/dL    Comment: Glucose reference range applies only to samples taken after fasting for at least 8 hours.  Comprehensive metabolic panel     Status: Abnormal   Collection Time: 07/14/2020  4:12 AM  Result Value Ref Range   Sodium 142 135 - 145 mmol/L   Potassium 5.5 (H) 3.5 - 5.1 mmol/L   Chloride 115 (H) 98 -  111 mmol/L   CO2 19 (L) 22 - 32 mmol/L   Glucose, Bld 80 70 - 99 mg/dL    Comment: Glucose reference range applies only to samples taken after fasting for at least 8 hours.   BUN 43 (H) 6 - 20 mg/dL   Creatinine, Ser 3.08 (H) 0.44 - 1.00 mg/dL   Calcium 8.1 (L) 8.9 - 10.3 mg/dL   Total Protein 5.1 (L) 6.5 - 8.1 g/dL   Albumin 1.3 (L) 3.5 - 5.0 g/dL   AST 52 (H) 15 - 41 U/L   ALT 34 0 - 44 U/L   Alkaline Phosphatase 547 (H) 38 - 126 U/L   Total Bilirubin 0.8 0.3 - 1.2 mg/dL   GFR calc non Af Amer 16 (L) >60 mL/min   Anion gap 8 5 - 15    Comment: Performed at [facility] [address] [place], [place] [address]  CBC     Status: Abnormal   Collection Time: 07/22/2020  4:12 AM  Result Value Ref Range   WBC 12.6 (H) 4.0 - 10.5 K/uL   RBC 3.61 (L) 3.87 - 5.11 MIL/uL   Hemoglobin 10.4 (L) 12.0 - 15.0 g/dL   HCT 33.0 (L) 36 - 46 %   MCV 91.4 80.0 - 100.0 fL   MCH 28.8 26.0 - 34.0 pg   MCHC 31.5 30.0 - 36.0 g/dL   RDW 19.1 (H) 11.5 - 15.5 %   Platelets 185 150 - 400 K/uL   nRBC 0.0 0.0 - 0.2 %    Comment: Performed at [facility] [address] [place], [place] [address]  Magnesium     Status: None   Collection Time: 07/08/2020  4:12 AM  Result Value Ref Range   Magnesium 2.1 1.7 - 2.4 mg/dL    Comment: Performed at [facility] [address], [place], [place] [address]  Hemoglobin A1c     Status: Abnormal   Collection Time: 07/30/2020  4:12 AM  Result Value Ref Range   Hgb A1c MFr Bld 4.2 (L) 4.8 - 5.6 %    Comment: (NOTE) Pre diabetes:          5.7%-6.4%  Diabetes:              >6.4%  Glycemic control for   <7.0% adults with diabetes    Mean Plasma Glucose 73.84 mg/dL    Comment: Performed at [facility] [address] [place], [place] [address]  Glucose, capillary     Status: Abnormal   Collection Time: 07/26/2020  7:39 AM  Result Value Ref Range   Glucose-Capillary 57 (L) 70 - 99 mg/dL    Comment: Glucose reference range applies only to samples taken after fasting for at least 8 hours.  Glucose, capillary     Status: Abnormal   Collection Time: 07/28/2020  8:25 AM  Result Value Ref Range   Glucose-Capillary 121 (H) 70 - 99 mg/dL    Comment: Glucose reference range applies only to samples taken after fasting for at least 8 hours.  Glucose, capillary     Status: Abnormal   Collection Time: 07/19/2020 11:33 AM  Result Value Ref Range   Glucose-Capillary 103 (H) 70 - 99 mg/dL    Comment: Glucose reference range applies only to samples taken after fasting for at least 8 hours.  MRSA PCR Screening     Status: None    Collection Time: 07/28/2020  1:10 PM  Specimen: Nasal Mucosa; Nasopharyngeal  Result Value Ref Range   MRSA by PCR NEGATIVE NEGATIVE    Comment:        The GeneXpert MRSA Assay (FDA approved for NASAL specimens only), is one component of a comprehensive MRSA colonization surveillance program. It is not intended to diagnose MRSA infection nor to guide or monitor treatment for MRSA infections. Performed at [facility], [place] [address] [place], [place] [address]   Glucose, capillary     Status: None   Collection Time: 07/11/2020  1:20 PM  Result Value Ref Range   Glucose-Capillary 88 70 - 99 mg/dL    Comment: Glucose reference range applies only to samples taken after fasting for at least 8 hours.    DG Tibia/Fibula Right  Result Date: 07/04/2020 CLINICAL DATA:  Wound DKA EXAM: RIGHT TIBIA AND FIBULA - 2 VIEW COMPARISON:  05/01/2020 FINDINGS: Extensive vascular calcifications. Patient is status post below the knee amputation. Cut margins of the tibia and fibula appear smooth and without bony destructive change. Mild degenerative changes at the right knee. IMPRESSION: Status post below the knee amputation. No acute osseous abnormality. Electronically Signed   By: Donavan Foil M.D.   On: 08/01/2020 17:40   DG CHEST PORT 1 VIEW  Result Date: 07/05/2020 CLINICAL DATA:  COVID infection. EXAM: PORTABLE CHEST 1 VIEW COMPARISON:  Radiograph 05/15/2020 FINDINGS: Improved lung volumes from prior exam. The heart is normal in size. Normal mediastinal contours. The previous bilateral lung opacities have resolved, likely resolved pulmonary edema. No persistent or focal consolidation. There may be trace residual small left pleural effusion, pleural effusions have otherwise resolved. No pneumothorax or evidence of pneumomediastinum. Postsurgical change in the upper abdomen. Bones diffusely under mineralized. IMPRESSION: Resolved bilateral lung opacities from prior exam, likely resolved pulmonary  edema. Improved pleural effusions with trace residual on the left. No evidence of recurrent or persistent airspace disease/pneumonia. Electronically Signed   By: Keith Rake M.D.   On: 07/18/2020 23:58    ROS: No recent fever, chills, nausea, vomiting or changes in her appetite.  10 system review was otherwise negative. PE:  Blood pressure 138/70, pulse 84, temperature 97.6 F (36.4 C), temperature source Oral, resp. rate 20, weight 50.9 kg, SpO2 100 %. Well-developed thin unwell appearing woman in no apparent distress.  Alert and oriented.  Affect is flat.  Extraocular motions are intact.  Respirations are unlabored.  Below-knee amputation site on the right is dressed and dry.  Left foot has a superficial ulcer posteriorly that is healing.  4-5 strength at the quad and hamstring.  General atrophy of the quad.  No lymphadenopathy or lymphangitis.  Intact sensibility to light touch of the leg.  Brisk capillary refill at the right lower extremity.  Assessment/Plan: Dehiscence of right below-knee amputation wound -the patient will need irrigation and excisional debridement of the right below-knee amputation stump.  We will plan this for Tuesday.  I discussed the plan with Dr. Thereasa Solo.  We will follow while she is in the hospital.  Wanda [name] 07/20/2020, 3:10 PM

## 2020-07-09 NOTE — Progress Notes (Signed)
Wanda [name]  UYQ:[id number] DOB: December 25, 1960 DOA: 07/10/2020 PCP: Lorene Dy, MD    Brief Narrative:  59 year old with a history of paroxysmal atrial fibrillation on Eliquis, CKD stage IV status post renal transplant, anemia of chronic kidney disease, gastric sleeve, hypoalbuminemia, DM2, HTN, hypothyroidism, GERD, peripheral arterial disease, and chronic neuropathy who was admitted to the hospital 05/01/2020-06/01/2020 in septic shock secondary to a right foot ulcer with Klebsiella bacteremia complicated by endocarditis.  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  She returned to the ED 10/5 with a 1 week history of drainage and dehiscence of the stump site of her right lower extremity.  In the ED the patient was found to have an elevated WBC.  X-ray right tibia/fibula was without evidence of osseous abnormality.  She was found to be hyperkalemic at 6.0 and in acute renal failure with creatinine 3.4 up from baseline of 2.4.  The wound was discussed with orthopedics who apparently suggested follow-up in the office.  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  Potassium remains elevated above goal. Denies cp, n/v, or abdom pain. C/o uncontrolled puritis. Reports pain in her BKA site is not controlled, w/ pain med wearing off in about 3 hrs.   Discussed care w/ Ortho Dr. Doran Durand.   Assessment & Plan:  R BKA stump site dehiscence X-ray without evidence to suggest osteomyelitis -finished a prolonged course of IV meropenem 9/25 -blood cultures without growth thus far - Ortho has formally consulted today and will plan more extensive wound closure early next week assuming  medical issues have stabilized  Recent COVID Was diagnosed with Covid 9/10 while at [facility] rehab center and quarantined for 14 days - last day of quarantine was 10/1 -CXR notes resolved bilateral lung opacities compared to previous exam -entirely asymptomatic from this regard -no indication for ongoing isolation  Acute kidney injury on CKD stage IV status post renal transplant Baseline creatinine 2.1-2.4 -creatinine 3.4 at presentation -nephrology reviewed and suggested volume resuscitation and close monitoring -creatinine slowly improving but not yet at goal -continue to hydrate and follow  Recent Labs  Lab 07/14/2020 1633 07/08/20 0013 07/08/20 1354 07/08/2020 0412  CREATININE 3.46* 3.15* 3.17* 3.08*    Hyperkalemia Being treated with Veltassa - temporizing measures provided at point of admission -potassium slow to normalize -adjust treatment and follow  Recent Labs  Lab 07/25/2020 1633 07/23/2020 2122 07/08/20 0013 07/08/20 1354 07/05/2020 0412  K 6.0* 5.9* 5.4* 5.6* 5.5*    Hypotension Likely simply due to volume depletion -continue hydration -consider adding midodrine  Chronic paroxysmal atrial fibrillation Continue home amiodarone and Eliquis -rate controlled  DM 2 - controlled but with chronic neuropathy Does not require home medical therapy -developed some hypoglycemia this morning -stop SSI and simply follow CBGs  Hypothyroidism Reported history of such but is not on thyroid hormone replacement - TSH signif elevated at 23.73 - initiate synthroid and follow   Code Status: FULL CODE Family Communication:  Status is: Inpatient  Remains inpatient appropriate because:Persistent severe electrolyte disturbances   Dispo: The patient is from: SNF              Anticipated d/c is to: SNF  Anticipated d/c date is: 3 days              Patient currently is not medically stable to d/c.   Consultants:  none  Objective: Blood pressure (!) 96/24, pulse 80,  temperature 97.6 F (36.4 C), temperature source Oral, resp. rate 20, weight 50.9 kg, SpO2 100 %.  Intake/Output Summary (Last 24 hours) at 07/11/2020 0840 Last data filed at 07/27/2020 0300 Gross per 24 hour  Intake 2395.99 ml  Output 450 ml  Net 1945.99 ml   Filed Weights   07/08/20 1130  Weight: 50.9 kg    Examination: General: No acute respiratory distress Lungs: Clear to auscultation bilaterally without wheezing Cardiovascular: Regular rate without murmur or rub Abdomen: Thin, soft, BS positive, no rebound Extremities: No significant cyanosis, clubbing, or edema   CBC: Recent Labs  Lab 07/08/2020 1633 07/08/20 0013 07/27/2020 0412  WBC 14.1* 15.2* 12.6*  NEUTROABS 13.2*  --   --   HGB 12.4 13.2 10.4*  HCT 40.7 42.4 33.0*  MCV 93.6 93.8 91.4  PLT 196 158 546   Basic Metabolic Panel: Recent Labs  Lab 07/18/2020 1633 07/08/2020 2122 07/08/20 0013 07/08/20 1354 07/22/2020 0412  NA   < >  --  139 141 142  K   < > 5.9* 5.4* 5.6* 5.5*  CL   < >  --  112* 111 115*  CO2   < >  --  18* 18* 19*  GLUCOSE   < >  --  68* 89 80  BUN   < >  --  46* 44* 43*  CREATININE   < >  --  3.15* 3.17* 3.08*  CALCIUM   < >  --  7.7* 8.2* 8.1*  MG  --  2.1  --   --  2.1  PHOS  --   --  3.6  --   --    < > = values in this interval not displayed.   GFR: Estimated Creatinine Clearance: 15.8 mL/min (A) (by C-G formula based on SCr of 3.08 mg/dL (H)).  Liver Function Tests: Recent Labs  Lab 07/19/2020 1633 07/08/20 0013 07/03/2020 0412  AST 62*  --  52*  ALT 30  --  34  ALKPHOS 568*  --  547*  BILITOT 0.9  --  0.8  PROT 5.9*  --  5.1*  ALBUMIN 1.6* 1.4* 1.3*    HbA1C: Hgb A1c MFr Bld  Date/Time Value Ref Range Status  07/21/2020 04:12 AM 4.2 (L) 4.8 - 5.6 % Final    Comment:    (NOTE) Pre diabetes:          5.7%-6.4%  Diabetes:              >6.4%  Glycemic control for   <7.0% adults with diabetes   05/02/2020 01:06 AM 4.1 (L) 4.8 - 5.6 % Final    Comment:    (NOTE) Pre  diabetes:          5.7%-6.4%  Diabetes:              >6.4%  Glycemic control for   <7.0% adults with diabetes     CBG: Recent Labs  Lab 07/08/20 1322 07/08/20 1712 07/08/20 2009 07/03/2020 0739 07/26/2020 0825  GLUCAP 91 80 76 57* 121*    Recent Results (from the past 240 hour(s))  Culture, blood (routine x 2)     Status: None (Preliminary result)   Collection Time: 07/03/2020  5:00 PM  Specimen: BLOOD  Result Value Ref Range Status   Specimen Description BLOOD LEFT ANTECUBITAL  Final   Special Requests   Final    BOTTLES DRAWN AEROBIC AND ANAEROBIC Blood Culture results may not be optimal due to an inadequate volume of blood received in culture bottles   Culture   Final    NO GROWTH < 24 HOURS Performed at [facility] [address], [place], [place] [address]    Report Status PENDING  Incomplete  Culture, blood (routine x 2)     Status: None (Preliminary result)   Collection Time: 07/17/2020  6:06 PM   Specimen: BLOOD  Result Value Ref Range Status   Specimen Description BLOOD LEFT ANTECUBITAL  Final   Special Requests   Final    BOTTLES DRAWN AEROBIC AND ANAEROBIC Blood Culture adequate volume   Culture   Final    NO GROWTH < 24 HOURS Performed at [facility], [place] [address], [place], [place] [address]    Report Status PENDING  Incomplete  Respiratory Panel by RT PCR (Flu A&B, Covid) - Nasopharyngeal Swab     Status: Abnormal   Collection Time: 07/26/2020  9:00 PM   Specimen: Nasopharyngeal Swab  Result Value Ref Range Status   SARS Coronavirus 2 by RT PCR POSITIVE (A) NEGATIVE Final    Comment: RESULT CALLED TO, READ BACK BY AND VERIFIED WITH: B ROUNDTREE RN 07/24/2020 2217 JDW (NOTE) SARS-CoV-2 target nucleic acids are DETECTED.  SARS-CoV-2 RNA is generally detectable in upper respiratory specimens  during the acute phase of infection. Positive results are indicative of the presence of the identified virus, but do not rule out bacterial infection or  co-infection with other pathogens not detected by the test. Clinical correlation with patient history and other diagnostic information is necessary to determine patient infection status. The expected result is Negative.  Fact Sheet for Patients:  PinkCheek.be  Fact Sheet for Healthcare Providers: GravelBags.it  This test is not yet approved or cleared by the [place] FDA and  has been authorized for detection and/or diagnosis of SARS-CoV-2 by FDA under an Emergency Use Authorization (EUA).  This EUA will remain in effect (meaning this test can be use d) for the duration of  the COVID-19 declaration under Section 564(b)(1) of the Act, 21 U.S.C. section 360bbb-3(b)(1), unless the authorization is terminated or revoked sooner.      Influenza A by PCR NEGATIVE NEGATIVE Final   Influenza B by PCR NEGATIVE NEGATIVE Final    Comment: (NOTE) The Xpert Xpress SARS-CoV-2/FLU/RSV assay is intended as an aid in  the diagnosis of influenza from Nasopharyngeal swab specimens and  should not be used as a sole basis for treatment. Nasal washings and  aspirates are unacceptable for Xpert Xpress SARS-CoV-2/FLU/RSV  testing.  Fact Sheet for Patients: PinkCheek.be  Fact Sheet for Healthcare Providers: GravelBags.it  This test is not yet approved or cleared by the [place] FDA and  has been authorized for detection and/or diagnosis of SARS-CoV-2 by  FDA under an Emergency Use Authorization (EUA). This EUA will remain  in effect (meaning this test can be used) for the duration of the  Covid-19 declaration under Section 564(b)(1) of the Act, 21  U.S.C. section 360bbb-3(b)(1), unless the authorization is  terminated or revoked. Performed at [facility], [place] [address] [place], [place] [address]      Scheduled Meds: . amiodarone  200 mg Oral Daily  . apixaban  5  mg Oral Q12H  . ascorbic acid  250 mg Oral BID  . cholecalciferol  1,000 Units Oral Daily  . folic acid  1 mg Oral Daily  . hydrocortisone  25 mg Rectal BID  . mirtazapine  7.5 mg Oral QHS  . multivitamin with minerals  1 tablet Oral Daily  . nortriptyline  25 mg Oral QHS  . pantoprazole  40 mg Oral Daily  . patiromer  16.8 g Oral Daily  . predniSONE  5 mg Oral Daily  . ramelteon  8 mg Oral QHS  . senna-docusate  1 tablet Oral Daily  . sodium bicarbonate  650 mg Oral BID  . tacrolimus  1.5 mg Oral Q12H  . thiamine  100 mg Oral Daily  . zinc sulfate  220 mg Oral Daily   Continuous Infusions: . sodium chloride 125 mL/hr at 07/26/2020 0142     LOS: 1 day   Cherene Altes, MD [facility] Office  [phone] Pager - Text Page per Shea Evans  If 7PM-7AM, please contact night-coverage per Amion 07/04/2020, 8:40 AM

## 2020-07-09 NOTE — Progress Notes (Signed)
Infection Prevention  Unit: 2W Regarding: History of Positive Covid status of patient IP Recommendation:  [place] Department regarding positive Covid History. Confirmed patient had a positive point of care test at [facility] on 06/12/20.

## 2020-07-09 NOTE — Plan of Care (Signed)

## 2020-07-09 NOTE — Progress Notes (Signed)
Shift summary:Pt went for surgical procedure this am, Pt ate 1 piece of pancake and surgery was made aware. Stated was ok. Eliquis was given this hospital admission by staff per orders. Pt was returned to unit with new date provided.

## 2020-07-10 ENCOUNTER — Other Ambulatory Visit: Payer: Self-pay

## 2020-07-10 DIAGNOSIS — T8130XA Disruption of wound, unspecified, initial encounter: Secondary | ICD-10-CM | POA: Diagnosis not present

## 2020-07-10 LAB — GLUCOSE, CAPILLARY
Glucose-Capillary: 87 mg/dL (ref 70–99)
Glucose-Capillary: 72 mg/dL (ref 70–99)
Glucose-Capillary: 94 mg/dL (ref 70–99)
Glucose-Capillary: 86 mg/dL (ref 70–99)
Glucose-Capillary: 71 mg/dL (ref 70–99)

## 2020-07-10 LAB — T4, FREE: Free T4: 0.82 ng/dL (ref 0.61–1.12)

## 2020-07-10 LAB — COMPREHENSIVE METABOLIC PANEL
ALT: 30 U/L (ref 0–44)
AST: 40 U/L (ref 15–41)
Albumin: 1.3 g/dL — ABNORMAL LOW (ref 3.5–5.0)
Alkaline Phosphatase: 520 U/L — ABNORMAL HIGH (ref 38–126)
Anion gap: 9 (ref 5–15)
BUN: 44 mg/dL — ABNORMAL HIGH (ref 6–20)
CO2: 16 mmol/L — ABNORMAL LOW (ref 22–32)
Calcium: 8 mg/dL — ABNORMAL LOW (ref 8.9–10.3)
Chloride: 116 mmol/L — ABNORMAL HIGH (ref 98–111)
Creatinine, Ser: 2.86 mg/dL — ABNORMAL HIGH (ref 0.44–1.00)
GFR calc non Af Amer: 17 mL/min — ABNORMAL LOW (ref >60)
Glucose, Bld: 99 mg/dL (ref 70–99)
Potassium: 5.5 mmol/L — ABNORMAL HIGH (ref 3.5–5.1)
Sodium: 141 mmol/L (ref 135–145)
Total Bilirubin: 0.7 mg/dL (ref 0.3–1.2)
Total Protein: 5.3 g/dL — ABNORMAL LOW (ref 6.5–8.1)

## 2020-07-10 LAB — CBC
HCT: 34.7 % — ABNORMAL LOW (ref 36.0–46.0)
Hemoglobin: 10.8 g/dL — ABNORMAL LOW (ref 12.0–15.0)
MCH: 28.4 pg (ref 26.0–34.0)
MCHC: 31.1 g/dL (ref 30.0–36.0)
MCV: 91.3 fL (ref 80.0–100.0)
Platelets: 186 10*3/uL (ref 150–400)
RBC: 3.8 MIL/uL — ABNORMAL LOW (ref 3.87–5.11)
RDW: 19.3 % — ABNORMAL HIGH (ref 11.5–15.5)
WBC: 13.7 10*3/uL — ABNORMAL HIGH (ref 4.0–10.5)
nRBC: 0 % (ref 0.0–0.2)

## 2020-07-10 LAB — MAGNESIUM: Magnesium: 2.1 mg/dL (ref 1.7–2.4)

## 2020-07-10 LAB — CORTISOL: Cortisol, Plasma: 8.4 ug/dL

## 2020-07-10 MED ORDER — ONDANSETRON HCL 4 MG/2ML IJ SOLN
4.0000 mg | Freq: Four times a day (QID) | INTRAMUSCULAR | Status: DC | PRN
  Administered 2020-07-10 – 2020-07-12 (×5): 4 mg via INTRAVENOUS
  Filled 2020-07-10 (×5): qty 2

## 2020-07-10 MED ORDER — PROSOURCE PLUS PO LIQD
30.0000 mL | Freq: Three times a day (TID) | ORAL | Status: DC
  Administered 2020-07-10 – 2020-07-13 (×8): 30 mL via ORAL
  Filled 2020-07-10 (×21): qty 30

## 2020-07-10 MED ORDER — ENSURE ENLIVE PO LIQD: 237.0000 mL | Freq: Two times a day (BID) | ORAL | Status: DC

## 2020-07-10 MED ORDER — GLUCERNA SHAKE PO LIQD
237.0000 mL | Freq: Three times a day (TID) | ORAL | Status: DC
  Administered 2020-07-10: 237 mL via ORAL

## 2020-07-10 MED ORDER — ENSURE ENLIVE PO LIQD
237.0000 mL | Freq: Three times a day (TID) | ORAL | Status: DC
  Administered 2020-07-11 – 2020-07-17 (×14): 237 mL via ORAL

## 2020-07-10 NOTE — Progress Notes (Addendum)
Subjective: 1 Day Post-Op Procedure(s) (LRB): Revision right below knee amputation; possible right above knee amputation (Right)  Patient resting comfortably in bed.  Denies any pain.  Objective:   VITALS:  Temp:  [97.6 F (36.4 C)] 97.6 F (36.4 C) (10/07 2338) Pulse Rate:  [82-86] 86 (10/07 2338) Resp:  [17-20] 17 (10/07 2338) BP: (105-138)/(33-70) 117/42 (10/07 2338) SpO2:  [100 %] 100 % (10/07 2338)  General: WDWN patient in NAD. Psych:  Appropriate mood and affect. Neuro:  A&O x 3, Moving all extremities, sensation intact to light touch HEENT:  EOMs intact Chest:  Even non-labored respirations Skin: Dressing on R BKA site C/D/I, no rashes or lesions Extremities: warm/dry, no visible edema, erythema or echymosis.  No lymphadenopathy. Pulses: Femoral 2+ MSK:  ROM: TKE, MMT: able to perform quad set    LABS Recent Labs    07/05/2020 1633 07/20/2020 1633 07/08/20 0013 07/08/20 0013 07/20/2020 0412 07/10/20 0142  HGB 12.4  --  13.2  --  10.4* 10.8*  WBC 14.1*   < > 15.2*   < > 12.6* 13.7*  PLT 196   < > 158   < > 185 186   < > = values in this interval not displayed.   Recent Labs    07/03/2020 0412 07/10/20 0142  NA 142 141  K 5.5* 5.5*  CL 115* 116*  CO2 19* 16*  BUN 43* 44*  CREATININE 3.08* 2.86*  GLUCOSE 80 99   No results for input(s): LABPT, INR in the last 72 hours.   Assessment/Plan: 1 Day Post-Op Procedure(s) (LRB): Revision right below knee amputation; possible right above knee amputation (Right)  NWB R LE To OR on Tuesday 07/13/2020 for I&D and excisional debridement of R BKA stump by Dr. Doran Durand.  Addendum:  Hold eliquis 48 hours prior to surgery.  Mechele Claude PA-C [facility] Office:  [phone]

## 2020-07-10 NOTE — Progress Notes (Signed)
Wanda [name]  ZOX:[id number] DOB: 12-16-1960 DOA: 07/30/2020 PCP: Lorene Dy, MD    Brief Narrative:  59 year old with a history of paroxysmal atrial fibrillation on Eliquis, CKD stage IV status post renal transplant, anemia of chronic kidney disease, gastric sleeve, hypoalbuminemia, DM2, HTN, hypothyroidism, GERD, peripheral arterial disease, and chronic neuropathy who was admitted to the hospital 05/01/2020-06/01/2020 in septic shock secondary to a right foot ulcer with Klebsiella bacteremia complicated by endocarditis.  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  She returned to the ED 10/5 with a 1 week history of drainage and dehiscence of the stump site of her right lower extremity.  In the ED the patient was found to have an elevated WBC.  X-ray right tibia/fibula was without evidence of osseous abnormality.  She was found to be hyperkalemic at 6.0 and in acute renal failure with creatinine 3.4 up from baseline of 2.4.    Significant Events:  7/30 > 8/30 admit right foot cellulitis and Klebsiella bacteremia > R BKA 10/5 admit w/ AKI, hyperkalemia, and wound dehiscence   Antimicrobials:  none  DVT prophylaxis: Eliquis   Subjective: Resting comfortably in bed.  No new complaints today.  Assessment & Plan:  R BKA stump site dehiscence X-ray without evidence to suggest osteomyelitis -finished a prolonged course of IV meropenem 9/25 -blood cultures without growth thus far - Ortho has formally consulted and will plan more extensive wound closure early next week assuming medical issues have stabilized  Recent COVID Was diagnosed with Covid 9/10 while at [facility] and quarantined for 14 days - last day of quarantine was 10/1 - CXR notes resolved bilateral lung opacities compared to previous exam -entirely asymptomatic from this regard -no indication for isolation  Acute kidney  injury on CKD stage IV status post renal transplant Baseline creatinine 2.1-2.4 -creatinine 3.4 at presentation -nephrology reviewed and suggested volume resuscitation and close monitoring -creatinine slowly improving but not yet at goal -continue to hydrate   Recent Labs  Lab 07/22/2020 1633 07/08/20 0013 07/08/20 1354 07/11/2020 0412 07/10/20 0142  CREATININE 3.46* 3.15* 3.17* 3.08* 2.86*    Hyperkalemia Being treated with Veltassa - temporizing measures provided at point of admission -potassium slow to normalize, but improving renal fxn should help significantly   Recent Labs  Lab 07/06/2020 2122 07/08/20 0013 07/08/20 1354 07/04/2020 0412 07/10/20 0142  K 5.9* 5.4* 5.6* 5.5* 5.5*    Hypotension Likely simply due to volume depletion -blood pressure appears to be improving with consistent volume resuscitation  Chronic paroxysmal atrial fibrillation Continue home amiodarone and Eliquis - rate controlled  DM 2 - controlled but with chronic neuropathy Does not require home medical therapy -hypoglycemia resolved and CBG stable without insulin therapy  Hypothyroidism Reported history of such but is not on thyroid hormone replacement - TSH signif elevated at 23.73 -free T4 actually normal at 0.8 -awaiting free T3  Code Status: FULL CODE Family Communication:  Status is: Inpatient  Remains inpatient appropriate because:Persistent severe electrolyte disturbances   Dispo: The patient is from: SNF              Anticipated d/c is to: SNF              Anticipated d/c date is: 3 days              Patient currently is not medically stable to d/c.   Consultants:  none  Objective: Blood pressure 138/69, pulse 82, temperature (!) 97.5 F (36.4 C), resp. rate 12, weight 50.9 kg, SpO2 100 %.  Intake/Output Summary (Last 24 hours) at 07/10/2020 0829 Last data filed at 07/04/2020 1804 Gross per 24 hour  Intake --  Output 350 ml  Net -350 ml   Filed Weights   07/08/20 1130   Weight: 50.9 kg    Examination: General: No acute respiratory distress Lungs: Clear to auscultation bilaterally Cardiovascular: Regular rate -no murmur or rub Abdomen: Thin, soft, BS positive, no rebound Extremities: No significant cyanosis, clubbing, edema   CBC: Recent Labs  Lab 07/11/2020 1633 08/02/2020 1633 07/08/20 0013 07/28/2020 0412 07/10/20 0142  WBC 14.1*   < > 15.2* 12.6* 13.7*  NEUTROABS 13.2*  --   --   --   --   HGB 12.4   < > 13.2 10.4* 10.8*  HCT 40.7   < > 42.4 33.0* 34.7*  MCV 93.6   < > 93.8 91.4 91.3  PLT 196   < > 158 185 186   < > = values in this interval not displayed.   Basic Metabolic Panel: Recent Labs  Lab 07/06/2020 1633 07/15/2020 2122 07/08/20 0013 07/08/20 0013 07/08/20 1354 07/08/2020 0412 07/10/20 0142  NA   < >  --  139   < > 141 142 141  K   < > 5.9* 5.4*   < > 5.6* 5.5* 5.5*  CL   < >  --  112*   < > 111 115* 116*  CO2   < >  --  18*   < > 18* 19* 16*  GLUCOSE   < >  --  68*   < > 89 80 99  BUN   < >  --  46*   < > 44* 43* 44*  CREATININE   < >  --  3.15*   < > 3.17* 3.08* 2.86*  CALCIUM   < >  --  7.7*   < > 8.2* 8.1* 8.0*  MG  --  2.1  --   --   --  2.1 2.1  PHOS  --   --  3.6  --   --   --   --    < > = values in this interval not displayed.   GFR: Estimated Creatinine Clearance: 17 mL/min (A) (by C-G formula based on SCr of 2.86 mg/dL (H)).  Liver Function Tests: Recent Labs  Lab 07/31/2020 1633 07/08/20 0013 07/27/2020 0412 07/10/20 0142  AST 62*  --  52* 40  ALT 30  --  34 30  ALKPHOS 568*  --  547* 520*  BILITOT 0.9  --  0.8 0.7  PROT 5.9*  --  5.1* 5.3*  ALBUMIN 1.6* 1.4* 1.3* 1.3*    HbA1C: Hgb A1c MFr Bld  Date/Time Value Ref Range Status  07/05/2020 04:12 AM 4.2 (L) 4.8 - 5.6 % Final    Comment:    (NOTE) Pre diabetes:          5.7%-6.4%  Diabetes:              >6.4%  Glycemic control for   <7.0% adults with diabetes   05/02/2020 01:06 AM 4.1 (L) 4.8 - 5.6 % Final    Comment:    (NOTE) Pre diabetes:           5.7%-6.4%  Diabetes:              >6.4%  Glycemic control for   <7.0% adults with diabetes     CBG: Recent Labs  Lab 07/27/2020 1320 07/04/2020 1652 08/01/2020 2105 07/10/20 0615 07/10/20 0809  GLUCAP 88 80 100* 87 72    Recent Results (from the past 240 hour(s))  Culture, blood (routine x 2)     Status: None (Preliminary result)   Collection Time: 07/22/2020  5:00 PM   Specimen: BLOOD  Result Value Ref Range Status   Specimen Description BLOOD LEFT ANTECUBITAL  Final   Special Requests   Final    BOTTLES DRAWN AEROBIC AND ANAEROBIC Blood Culture results may not be optimal due to an inadequate volume of blood received in culture bottles   Culture   Final    NO GROWTH 3 DAYS Performed at [facility], [place] [address] [place], [place] [address]    Report Status PENDING  Incomplete  Culture, blood (routine x 2)     Status: None (Preliminary result)   Collection Time: 07/14/2020  6:06 PM   Specimen: BLOOD  Result Value Ref Range Status   Specimen Description BLOOD LEFT ANTECUBITAL  Final   Special Requests   Final    BOTTLES DRAWN AEROBIC AND ANAEROBIC Blood Culture adequate volume   Culture   Final    NO GROWTH 3 DAYS Performed at [facility], [place] [address], [place], [place] [address]    Report Status PENDING  Incomplete  Respiratory Panel by RT PCR (Flu A&B, Covid) - Nasopharyngeal Swab     Status: Abnormal   Collection Time: 07/15/2020  9:00 PM   Specimen: Nasopharyngeal Swab  Result Value Ref Range Status   SARS Coronavirus 2 by RT PCR POSITIVE (A) NEGATIVE Final    Comment: RESULT CALLED TO, READ BACK BY AND VERIFIED WITH: B ROUNDTREE RN 07/25/2020 2217 JDW (NOTE) SARS-CoV-2 target nucleic acids are DETECTED.  SARS-CoV-2 RNA is generally detectable in upper respiratory specimens  during the acute phase of infection. Positive results are indicative of the presence of the identified virus, but do not rule out bacterial infection or co-infection with  other pathogens not detected by the test. Clinical correlation with patient history and other diagnostic information is necessary to determine patient infection status. The expected result is Negative.  Fact Sheet for Patients:  PinkCheek.be  Fact Sheet for Healthcare Providers: GravelBags.it  This test is not yet approved or cleared by the [place] FDA and  has been authorized for detection and/or diagnosis of SARS-CoV-2 by FDA under an Emergency Use Authorization (EUA).  This EUA will remain in effect (meaning this test can be use d) for the duration of  the COVID-19 declaration under Section 564(b)(1) of the Act, 21 U.S.C. section 360bbb-3(b)(1), unless the authorization is terminated or revoked sooner.      Influenza A by PCR NEGATIVE NEGATIVE Final   Influenza B by PCR NEGATIVE NEGATIVE Final    Comment: (NOTE) The Xpert Xpress SARS-CoV-2/FLU/RSV assay is intended as an aid in  the diagnosis of influenza from Nasopharyngeal swab specimens and  should not be used as a sole basis for treatment. Nasal washings and  aspirates are unacceptable for Xpert Xpress SARS-CoV-2/FLU/RSV  testing.  Fact Sheet for Patients: PinkCheek.be  Fact Sheet for Healthcare Providers: GravelBags.it  This test is not yet approved or cleared by the [place] FDA and  has been authorized for detection and/or diagnosis of SARS-CoV-2 by  FDA under an Emergency Use Authorization (EUA). This EUA will remain  in  effect (meaning this test can be used) for the duration of the  Covid-19 declaration under Section 564(b)(1) of the Act, 21  U.S.C. section 360bbb-3(b)(1), unless the authorization is  terminated or revoked. Performed at [facility], [place] [address], [place], [place] [address]   MRSA PCR Screening     Status: None   Collection Time: 07/08/2020  1:10 PM   Specimen:  Nasal Mucosa; Nasopharyngeal  Result Value Ref Range Status   MRSA by PCR NEGATIVE NEGATIVE Final    Comment:        The GeneXpert MRSA Assay (FDA approved for NASAL specimens only), is one component of a comprehensive MRSA colonization surveillance program. It is not intended to diagnose MRSA infection nor to guide or monitor treatment for MRSA infections. Performed at [facility], [place] [address] [place], [place] [address]      Scheduled Meds: . amiodarone  200 mg Oral Daily  . apixaban  2.5 mg Oral BID  . ascorbic acid  250 mg Oral BID  . cholecalciferol  1,000 Units Oral Daily  . folic acid  1 mg Oral Daily  . hydrocortisone  25 mg Rectal BID  . mirtazapine  7.5 mg Oral QHS  . multivitamin with minerals  1 tablet Oral Daily  . nortriptyline  25 mg Oral QHS  . pantoprazole  40 mg Oral Daily  . patiromer  16.8 g Oral Daily  . predniSONE  5 mg Oral Daily  . ramelteon  8 mg Oral QHS  . senna-docusate  1 tablet Oral Daily  . sodium bicarbonate  650 mg Oral BID  . tacrolimus  1.5 mg Oral Q12H  . thiamine  100 mg Oral Daily  . zinc sulfate  220 mg Oral Daily   Continuous Infusions: . sodium chloride 125 mL/hr at 07/10/20 0319     LOS: 2 days   Cherene Altes, MD [facility] Office  [phone] Pager - Text Page per Shea Evans  If 7PM-7AM, please contact night-coverage per Amion 07/10/2020, 8:29 AM

## 2020-07-10 NOTE — Progress Notes (Signed)
Patient remains is currently on Eliquis once a day. Patient has upcoming surgery next week Tuesday 1012/21. MD on call was contacted about when to hold Eliquis, stated that it should be held 48hrs prior to surgery.

## 2020-07-10 NOTE — Plan of Care (Signed)

## 2020-07-10 NOTE — Progress Notes (Addendum)
Initial Nutrition Assessment  DOCUMENTATION CODES:   Severe malnutrition in context of chronic illness  INTERVENTION:  D/c Glucerna  Ensure Enlive po TID, each supplement provides 350 kcal and 20 grams of protein  80ml Prosource Plus po TID, each supplement provides 100 kcal and 15 grams of protein   Continue MVI daily  NUTRITION DIAGNOSIS:   Severe Malnutrition related to chronic illness as evidenced by energy intake < or equal to 75% for > or equal to 1 month, percent weight loss.    GOAL:   Patient will meet greater than or equal to 90% of their needs    MONITOR:   PO intake, Supplement acceptance, Weight trends, Labs, I & O's, Skin  REASON FOR ASSESSMENT:   Consult Assessment of nutrition requirement/status  ASSESSMENT:   Pt admitted with RLE stump site wound dehiscence. PMH includes A.fib., CKD stage IV s/p renal transplant, anemia of CKD, gastric sleeve, hypoalbuminemia, type 2 DM, HTN, hypothyroidism, GERD, peripheral artery disease, chronic neuropathy, and recent admission 7/30-8/30 for septic shock 2/2 R foot ulcer w/ Klebsiella bacteremia complicated by endocarditis. 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 Pt recently COVID-8 positive; last day of quarantine was 10/1.  Pt reports poor appetite now and PTA. No meal documentation available; however, RN reports pt has been eating only 0-10% of all meals. Noted that pt was eating poorly during previous hospital admission as well and pt was diagnosed with severe malnutrition in context of chronic illness. Glucerna BID was ordered for pt today. Pt has consumed one so far. Note that Ensure Enlive is more appropriate for this pt given her poor po intake. Glucerna only provides 220 kcals and 10 grams of protein, whereas Ensure Enlive provides 350 kcals and 20 grams of protein.  Per wt readings, pt weighed 79.9 kg on 02/11/20. Pt now weighs 50.9 kg. This indicates a 30% wt loss x5 months, which is severe and significant for time frame. Pt  meets criteria for severe malnutrition given significant wt loss and poor po intake >1 month.   Labs: K+ 5.5 (H) Medications: vitamin c, vitamin D3, folvite, remeron, mvi, protonix, deltasone, senokot-s, sodium bicarbonate, thiamine, zinc sulfate  Diet Order:   Diet Order            Diet regular Room service appropriate? Yes; Fluid consistency: Thin  Diet effective now                 EDUCATION NEEDS:   No education needs have been identified at this time  Skin:  Skin Assessment: Skin Integrity Issues: Skin Integrity Issues:: Stage II, Unstageable Stage II: L buttocks Unstageable: L heel  Last BM:  10/6  Height:   Ht Readings from Last 1 Encounters:  07/10/20 5\' 9"  (1.753 m)    Weight:   Wt Readings from Last 1 Encounters:  07/08/20 50.9 kg    BMI:  Body mass index is 16.57 kg/m.  Estimated Nutritional Needs:   Kcal:  2000-2200  Protein:  100-115 grams  Fluid:  >/=2L/d    Larkin Ina, MS, RD, LDN RD pager number and weekend/on-call pager number located in [facility].

## 2020-07-10 NOTE — Progress Notes (Signed)
Pt not eating much 0-10% breakfast, lunch and dinner. Nutritional drinks added per protocol. Pt encourage to move in bed. Request for PT and OT per protocol to help promote/maintain as much mobility as possible for patient and other beneficial outcomes.

## 2020-07-10 NOTE — Progress Notes (Signed)
Night shift nurse reported that eliquis was given during shift and was made aware by medical that it could not be given within 24-48 hours before procedure. Nurse also reported that patient systolic b/p went to 89 during their shift but was unable to recall diastolic when asked. Stated it should be in chart, for this nurse to check. Nurse asked was MD made aware, nurse stated no that patient b/p was noted to run low previously. B/p was not noted in chart. Asked patient in relation to b/p. Pt stated yes. Patient asked if they remembered the time. Pt stated "around 9-10 o'clock". Asked patient if they were told when they needed to stop eliquis for procedure, pt stated Sunday.

## 2020-07-11 DIAGNOSIS — T8130XA Disruption of wound, unspecified, initial encounter: Secondary | ICD-10-CM | POA: Diagnosis not present

## 2020-07-11 LAB — GLUCOSE, CAPILLARY
Glucose-Capillary: 72 mg/dL (ref 70–99)
Glucose-Capillary: 82 mg/dL (ref 70–99)
Glucose-Capillary: 83 mg/dL (ref 70–99)
Glucose-Capillary: 90 mg/dL (ref 70–99)
Glucose-Capillary: 95 mg/dL (ref 70–99)

## 2020-07-11 LAB — T3, FREE: T3, Free: 0.8 pg/mL — ABNORMAL LOW (ref 2.0–4.4)

## 2020-07-11 LAB — BASIC METABOLIC PANEL
Anion gap: 9 (ref 5–15)
BUN: 43 mg/dL — ABNORMAL HIGH (ref 6–20)
CO2: 16 mmol/L — ABNORMAL LOW (ref 22–32)
Calcium: 8 mg/dL — ABNORMAL LOW (ref 8.9–10.3)
Chloride: 116 mmol/L — ABNORMAL HIGH (ref 98–111)
Creatinine, Ser: 2.79 mg/dL — ABNORMAL HIGH (ref 0.44–1.00)
GFR, Estimated: 18 mL/min — ABNORMAL LOW (ref >60)
Glucose, Bld: 80 mg/dL (ref 70–99)
Potassium: 5.8 mmol/L — ABNORMAL HIGH (ref 3.5–5.1)
Sodium: 141 mmol/L (ref 135–145)

## 2020-07-11 MED ORDER — DOCUSATE SODIUM 100 MG PO CAPS
100.0000 mg | ORAL_CAPSULE | Freq: Two times a day (BID) | ORAL | Status: DC
  Administered 2020-07-11 – 2020-07-13 (×5): 100 mg via ORAL
  Filled 2020-07-11 (×5): qty 1

## 2020-07-11 MED ORDER — POLYETHYLENE GLYCOL 3350 17 G PO PACK
17.0000 g | PACK | Freq: Every day | ORAL | Status: DC | PRN
  Filled 2020-07-11: qty 1

## 2020-07-11 MED ORDER — SODIUM BICARBONATE 650 MG PO TABS
650.0000 mg | ORAL_TABLET | Freq: Three times a day (TID) | ORAL | Status: DC
  Administered 2020-07-11 – 2020-07-17 (×17): 650 mg via ORAL
  Filled 2020-07-11 (×17): qty 1

## 2020-07-11 MED ORDER — BISACODYL 5 MG PO TBEC
10.0000 mg | DELAYED_RELEASE_TABLET | Freq: Once | ORAL | Status: AC
  Administered 2020-07-11: 10 mg via ORAL
  Filled 2020-07-11: qty 2

## 2020-07-11 MED ORDER — SODIUM ZIRCONIUM CYCLOSILICATE 5 G PO PACK: 5.0000 g | PACK | Freq: Three times a day (TID) | ORAL | Status: DC

## 2020-07-11 MED ORDER — SODIUM ZIRCONIUM CYCLOSILICATE 5 G PO PACK
5.0000 g | PACK | Freq: Three times a day (TID) | ORAL | Status: AC
  Administered 2020-07-11 (×3): 5 g via ORAL
  Filled 2020-07-11 (×3): qty 1

## 2020-07-11 NOTE — Progress Notes (Signed)
Wanda [name]  FXT:[id number] DOB: [birth year] DOA: 07/28/2020 PCP: Lorene Dy, MD    Brief Narrative:  [phone] with a history of paroxysmal atrial fibrillation on Eliquis, CKD stage IV status post renal transplant, anemia of chronic kidney disease, gastric sleeve, hypoalbuminemia, DM2, HTN, hypothyroidism, GERD, peripheral arterial disease, and chronic neuropathy who was admitted to the hospital 05/01/2020-06/01/2020 in septic shock secondary to a right foot ulcer with Klebsiella bacteremia complicated by endocarditis.  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  She returned to the ED 10/5 with a 1 week history of drainage and dehiscence of the stump site of her right lower extremity.  In the ED the patient was found to have an elevated WBC.  X-ray right tibia/fibula was without evidence of osseous abnormality.  She was found to be hyperkalemic at 6.0 and in acute renal failure with creatinine 3.4 up from baseline of 2.4.    Significant Events:  7/30 > 8/30 admit right foot cellulitis and Klebsiella bacteremia > R BKA 10/5 admit w/ AKI, hyperkalemia, and wound dehiscence   Antimicrobials:  none  DVT prophylaxis: Eliquis   Subjective: Has developed some constipation, for which she typically takes fleets enemas at home for report.  Vital signs are stable.  Saturations 100% on room air.  She is afebrile.  Hypokalemia is proving to be a persisting problem, despite improving creatinine. No new complaints at time of exam.   Assessment & Plan:  R BKA stump site dehiscence X-ray without evidence to suggest osteomyelitis -finished a prolonged course of IV meropenem 9/25 -blood cultures without growth thus far - Ortho has formally consulted and will plan more extensive wound closure early next week assuming medical issues have stabilized  Recent COVID Was diagnosed with Covid 9/10 while at [facility] and quarantined for 14 days - last day of quarantine was 10/1 - CXR notes resolved bilateral lung opacities compared to previous exam -entirely asymptomatic from this regard -no indication for isolation  Acute kidney injury on CKD stage IV status post renal transplant Baseline creatinine 2.1-2.4 -creatinine 3.4 at presentation -Nephrology reviewed and suggested volume resuscitation and close monitoring -creatinine slowly improving   Recent Labs  Lab 07/08/20 0013 07/08/20 1354 07/30/2020 0412 07/10/20 0142 07/11/20 0225  CREATININE 3.15* 3.17* 3.08* 2.86* 2.79*    Hyperkalemia Change to [facility] - add Na bicarb - cont to follow trend   Recent Labs  Lab 07/08/20 0013 07/08/20 1354 07/21/2020 0412 07/10/20 0142 07/11/20 0225  K 5.4* 5.6* 5.5* 5.5* 5.8*    Hypotension Likely simply due to volume depletion -blood pressure has improved with ongoing volume resuscitation -random serum cortisol not low  Chronic paroxysmal atrial fibrillation Continue home amiodarone and Eliquis - rate controlled in NSR  DM 2 - controlled but with chronic neuropathy Does not require home medical therapy -hypoglycemia resolved and CBG stable without insulin therapy  Hypothyroidism Reported history of such but is not on thyroid hormone replacement - TSH signif elevated at 23.73 -free T4 actually normal at 0.8 - awaiting free T3  Severe malnutrition in context of chronic illness Nutrition consulted  Code Status: FULL CODE Family Communication:  Status is: Inpatient  Remains inpatient appropriate because:Persistent severe electrolyte disturbances   Dispo: The patient is from: SNF              Anticipated d/c is to: SNF  Anticipated d/c date is: 3 days              Patient currently is not medically stable to d/c.   Consultants:  none  Objective: Blood pressure 119/65, pulse 90, temperature 98.2 F (36.8 C), temperature source Oral, resp. rate 15, height 5\' 9"  (1.753  m), weight 50.9 kg, SpO2 100 %.  Intake/Output Summary (Last 24 hours) at 07/11/2020 0809 Last data filed at 07/11/2020 0500 Gross per 24 hour  Intake 4331.04 ml  Output --  Net 4331.04 ml   Filed Weights   07/08/20 1130  Weight: 50.9 kg    Examination: General: No acute respiratory distress - alert and pleasant  Lungs: Clear to auscultation B - no wheezing  Cardiovascular: Regular rate - no M or rub Abdomen: Thin, soft, BS positive, no rebound Extremities: No significant edema   CBC: Recent Labs  Lab 07/23/2020 1633 07/12/2020 1633 07/08/20 0013 07/23/2020 0412 07/10/20 0142  WBC 14.1*   < > 15.2* 12.6* 13.7*  NEUTROABS 13.2*  --   --   --   --   HGB 12.4   < > 13.2 10.4* 10.8*  HCT 40.7   < > 42.4 33.0* 34.7*  MCV 93.6   < > 93.8 91.4 91.3  PLT 196   < > 158 185 186   < > = values in this interval not displayed.   Basic Metabolic Panel: Recent Labs  Lab 07/12/2020 1633 07/31/2020 2122 07/08/20 0013 07/08/20 1354 07/16/2020 0412 07/10/20 0142 07/11/20 0225  NA   < >  --  139   < > 142 141 141  K   < > 5.9* 5.4*   < > 5.5* 5.5* 5.8*  CL   < >  --  112*   < > 115* 116* 116*  CO2   < >  --  18*   < > 19* 16* 16*  GLUCOSE   < >  --  68*   < > 80 99 80  BUN   < >  --  46*   < > 43* 44* 43*  CREATININE   < >  --  3.15*   < > 3.08* 2.86* 2.79*  CALCIUM   < >  --  7.7*   < > 8.1* 8.0* 8.0*  MG  --  2.1  --   --  2.1 2.1  --   PHOS  --   --  3.6  --   --   --   --    < > = values in this interval not displayed.   GFR: Estimated Creatinine Clearance: 17.4 mL/min (A) (by C-G formula based on SCr of 2.79 mg/dL (H)).  Liver Function Tests: Recent Labs  Lab 07/30/2020 1633 07/08/20 0013 07/07/2020 0412 07/10/20 0142  AST 62*  --  52* 40  ALT 30  --  34 30  ALKPHOS 568*  --  547* 520*  BILITOT 0.9  --  0.8 0.7  PROT 5.9*  --  5.1* 5.3*  ALBUMIN 1.6* 1.4* 1.3* 1.3*    HbA1C: Hgb A1c MFr Bld  Date/Time Value Ref Range Status  07/12/2020 04:12 AM 4.2 (L) 4.8 - 5.6 % Final     Comment:    (NOTE) Pre diabetes:          5.7%-6.4%  Diabetes:              >6.4%  Glycemic control for   <7.0% adults with diabetes  05/02/2020 01:06 AM 4.1 (L) 4.8 - 5.6 % Final    Comment:    (NOTE) Pre diabetes:          5.7%-6.4%  Diabetes:              >6.4%  Glycemic control for   <7.0% adults with diabetes     CBG: Recent Labs  Lab 07/10/20 0809 07/10/20 1202 07/10/20 1558 07/10/20 2125 07/11/20 0002  GLUCAP 72 94 86 71 72    Recent Results (from the past 240 hour(s))  Culture, blood (routine x 2)     Status: None (Preliminary result)   Collection Time: 07/23/2020  5:00 PM   Specimen: BLOOD  Result Value Ref Range Status   Specimen Description BLOOD LEFT ANTECUBITAL  Final   Special Requests   Final    BOTTLES DRAWN AEROBIC AND ANAEROBIC Blood Culture results may not be optimal due to an inadequate volume of blood received in culture bottles   Culture   Final    NO GROWTH 4 DAYS Performed at [facility], [employer] [address] [place], [place] [address]    Report Status PENDING  Incomplete  Culture, blood (routine x 2)     Status: None (Preliminary result)   Collection Time: 07/11/2020  6:06 PM   Specimen: BLOOD  Result Value Ref Range Status   Specimen Description BLOOD LEFT ANTECUBITAL  Final   Special Requests   Final    BOTTLES DRAWN AEROBIC AND ANAEROBIC Blood Culture adequate volume   Culture   Final    NO GROWTH 4 DAYS Performed at [facility], [place] [address] [place], [place] [address]    Report Status PENDING  Incomplete  Respiratory Panel by RT PCR (Flu A&B, Covid) - Nasopharyngeal Swab     Status: Abnormal   Collection Time: 07/06/2020  9:00 PM   Specimen: Nasopharyngeal Swab  Result Value Ref Range Status   SARS Coronavirus 2 by RT PCR POSITIVE (A) NEGATIVE Final    Comment: RESULT CALLED TO, READ BACK BY AND VERIFIED WITH: B ROUNDTREE RN 07/06/2020 2217 JDW (NOTE) SARS-CoV-2 target nucleic acids are DETECTED.  SARS-CoV-2 RNA  is generally detectable in upper respiratory specimens  during the acute phase of infection. Positive results are indicative of the presence of the identified virus, but do not rule out bacterial infection or co-infection with other pathogens not detected by the test. Clinical correlation with patient history and other diagnostic information is necessary to determine patient infection status. The expected result is Negative.  Fact Sheet for Patients:  PinkCheek.be  Fact Sheet for Healthcare Providers: GravelBags.it  This test is not yet approved or cleared by the [place] FDA and  has been authorized for detection and/or diagnosis of SARS-CoV-2 by FDA under an Emergency Use Authorization (EUA).  This EUA will remain in effect (meaning this test can be use d) for the duration of  the COVID-19 declaration under Section 564(b)(1) of the Act, 21 U.S.C. section 360bbb-3(b)(1), unless the authorization is terminated or revoked sooner.      Influenza A by PCR NEGATIVE NEGATIVE Final   Influenza B by PCR NEGATIVE NEGATIVE Final    Comment: (NOTE) The Xpert Xpress SARS-CoV-2/FLU/RSV assay is intended as an aid in  the diagnosis of influenza from Nasopharyngeal swab specimens and  should not be used as a sole basis for treatment. Nasal washings and  aspirates are unacceptable for Xpert Xpress SARS-CoV-2/FLU/RSV  testing.  Fact Sheet for Patients: PinkCheek.be  Fact Sheet for Healthcare Providers: GravelBags.it  This test is not yet approved or cleared by the [place] FDA and  has been authorized for detection and/or diagnosis of SARS-CoV-2 by  FDA under an Emergency Use Authorization (EUA). This EUA will remain  in effect (meaning this test can be used) for the duration of the  Covid-19 declaration under Section 564(b)(1) of the Act, 21  U.S.C. section  360bbb-3(b)(1), unless the authorization is  terminated or revoked. Performed at [facility], [place] [address], [place], [place] [address]   MRSA PCR Screening     Status: None   Collection Time: 07/25/2020  1:10 PM   Specimen: Nasal Mucosa; Nasopharyngeal  Result Value Ref Range Status   MRSA by PCR NEGATIVE NEGATIVE Final    Comment:        The GeneXpert MRSA Assay (FDA approved for NASAL specimens only), is one component of a comprehensive MRSA colonization surveillance program. It is not intended to diagnose MRSA infection nor to guide or monitor treatment for MRSA infections. Performed at [facility], [place] [address] [place], [place] [address]      Scheduled Meds:  (feeding supplement) PROSource Plus  30 mL Oral TID BM   amiodarone  200 mg Oral Daily   apixaban  2.5 mg Oral BID   ascorbic acid  250 mg Oral BID   cholecalciferol  1,000 Units Oral Daily   docusate sodium  100 mg Oral BID   feeding supplement (ENSURE ENLIVE)  237 mL Oral TID WC   folic acid  1 mg Oral Daily   hydrocortisone  25 mg Rectal BID   mirtazapine  7.5 mg Oral QHS   multivitamin with minerals  1 tablet Oral Daily   nortriptyline  25 mg Oral QHS   pantoprazole  40 mg Oral Daily   patiromer  16.8 g Oral Daily   predniSONE  5 mg Oral Daily   ramelteon  8 mg Oral QHS   sodium bicarbonate  650 mg Oral BID   tacrolimus  1.5 mg Oral Q12H   thiamine  100 mg Oral Daily   zinc sulfate  220 mg Oral Daily   Continuous Infusions:  sodium chloride 100 mL/hr at 07/11/20 0500     LOS: 3 days   Cherene Altes, MD [facility] Office  [phone] Pager - Text Page per Shea Evans  If 7PM-7AM, please contact night-coverage per Amion 07/11/2020, 8:09 AM

## 2020-07-11 NOTE — Progress Notes (Signed)
Placed  On air mattress, prevalon boots applied

## 2020-07-11 NOTE — Evaluation (Signed)
Occupational Therapy Evaluation Patient Details Name: Wanda [name] MRN: [id number] DOB: 24-Mar-1961 Today's Date: 07/11/2020    History of Present Illness MERANDA DECHAINE is a 59 y.o. 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  Pt will have ortho intervention for closure during this admission.  Pt with significant PMH of recent COVID infection (off of precautions), chronic PAF, DM2, hypothyroidism, essential HTN, PVD, gout, CKD IV, Kidney transplant R 2013.    Clinical Impression   This 59 y/o female presents with the above. PTA pt in SNF for rehab, reports was mostly receiving bed level therapies, assisting with UB portions of ADL tasks and self-feeding. Pt tolerating sitting EOB today during session with two person assist for safe completion of bed mobility. Pt able to maintain static balance with close minguard assist but often requiring at least minA for balance during ADL/functional tasks. She requires up to [place] for toileting ADL at bed level. Pt to benefit from continued acute OT services and recommend continued therapy services at SNF setting to progress her overall safety and independence with ADL and mobility.     Follow Up Recommendations  SNF    Equipment Recommendations  Other (comment);Wheelchair (measurements OT);Wheelchair cushion (measurements OT) (TBD in next venue)           Precautions / Restrictions Precautions Precautions: Fall Restrictions Weight Bearing Restrictions: No      Mobility Bed Mobility Overal bed mobility: Needs Assistance Bed Mobility: Rolling;Sidelying to Sit;Sit to Supine Rolling: Mod assist;+2 for physical assistance Sidelying to sit: Mod assist;+2 for physical assistance   Sit to supine: Mod assist;+2 for physical assistance   General bed mobility comments: Mod assist to roll bil rolling left is easier due to less weight of her leg, assists with arms, but  weakly, Heavy mod assist to support trunk to come to sitting EOB.  once stable in sitting pt able to support herself propped on both arms, but for limited period of time due to reports of "dizziness".  Assist needed at trunk and legs, pt helping with hands on bed rail to return to supine.   Transfers                      Balance Overall balance assessment: Needs assistance Sitting-balance support: Feet supported;Bilateral upper extremity supported Sitting balance-Leahy Scale: Poor Sitting balance - Comments: close supervision EOB, pt not able to remove hand from bed in sitting without assist from therapist at her trunk.  Postural control: Right lateral lean                                 ADL either performed or assessed with clinical judgement   ADL Overall ADL's : Needs assistance/impaired Eating/Feeding: Set up;Bed level;Sitting   Grooming: Wash/dry face;Minimal assistance;Sitting Grooming Details (indicate cue type and reason): seated EOB Upper Body Bathing: Minimal assistance;Sitting;Bed level   Lower Body Bathing: Maximal assistance;+2 for physical assistance;Sitting/lateral leans;Bed level   Upper Body Dressing : Moderate assistance;Sitting;Bed level   Lower Body Dressing: Total assistance;+2 for physical assistance;Sitting/lateral leans;Bed level       Toileting- Clothing Manipulation and Hygiene: Total assistance;+2 for physical assistance;Bed level               Vision         Perception     Praxis  Pertinent Vitals/Pain Pain Assessment: Faces Faces Pain Scale: Hurts even more Pain Location: generalized and  R LE Pain Descriptors / Indicators: Grimacing;Guarding Pain Intervention(s): Limited activity within patient's tolerance;Monitored during session;Repositioned     Hand Dominance Left   Extremity/Trunk Assessment Upper Extremity Assessment Upper Extremity Assessment: Generalized weakness;RUE deficits/detail;LUE  deficits/detail RUE Deficits / Details: limited shoulder/scapular AROM RUE Coordination: decreased gross motor LUE Deficits / Details: limited shoulder/scapular AROM LUE Coordination: decreased gross motor   Lower Extremity Assessment Lower Extremity Assessment: Defer to PT evaluation RLE Deficits / Details: right leg BKA and very weak, unable to lift leg against gravity unassisted. 2/5 LLE Deficits / Details: left leg with significant deep pressure wound to heel, asked MD for prevalon order, also very weak on this side, unable to lift against gravity unassisted 2/5   Cervical / Trunk Assessment Cervical / Trunk Assessment: Other exceptions Cervical / Trunk Exceptions: C curve to the R (pt reports h/o scoliosis.    Communication Communication Communication: No difficulties   Cognition Arousal/Alertness: Awake/alert Behavior During Therapy: WFL for tasks assessed/performed Overall Cognitive Status:  (not specifically tested, grossly WNL)                                     General Comments  floated heels and rotated pt off of buttocks. PT to ask MD for order for prevalon and air mattress bed.     Exercises     Shoulder Instructions      Home Living Family/patient expects to be discharged to:: Skilled nursing facility                                        Prior Functioning/Environment Level of Independence: Needs assistance  Gait / Transfers Assistance Needed: pt reports at rehab she has been working on bed level exercises, she does not sit up because she becomes dizzy, so they have not transferred her to a WC.  ADL's / Homemaking Assistance Needed: pt reports she is able to assist with upper body bathing from bed level at SNF.            OT Problem List: Decreased strength;Decreased range of motion;Decreased activity tolerance;Impaired balance (sitting and/or standing);Impaired UE functional use;Pain;Decreased knowledge of use of DME or  AE      OT Treatment/Interventions: Self-care/ADL training;Therapeutic exercise;DME and/or AE instruction;Energy conservation;Therapeutic activities;Cognitive remediation/compensation;Patient/family education;Balance training    OT Goals(Current goals can be found in the care plan section) Acute Rehab OT Goals Patient Stated Goal: to get better OT Goal Formulation: With patient Time For Goal Achievement: 07/25/20 Potential to Achieve Goals: Fair  OT Frequency: Min 2X/week   Barriers to D/C:            Co-evaluation              AM-PAC OT "6 Clicks" Daily Activity     Outcome Measure Help from another person eating meals?: A Little Help from another person taking care of personal grooming?: A Little Help from another person toileting, which includes using toliet, bedpan, or urinal?: Total Help from another person bathing (including washing, rinsing, drying)?: A Lot Help from another person to put on and taking off regular upper body clothing?: A Lot Help from another person to put on and taking off regular lower body clothing?: Total 6 Click Score:  12   End of Session Nurse Communication: Mobility status  Activity Tolerance: Patient tolerated treatment well Patient left: in bed;with call bell/phone within reach;with nursing/sitter in room  OT Visit Diagnosis: Other abnormalities of gait and mobility (R26.89);Muscle weakness (generalized) (M62.81)                Time: 2583-4621 OT Time Calculation (min): 28 min Charges:  OT General Charges $OT Visit: 1 Visit OT Evaluation $OT Eval Moderate Complexity: [place], OT Acute Rehabilitation Services Pager [phone] Office [phone]   Raymondo Band 07/11/2020, 4:51 PM

## 2020-07-11 NOTE — Evaluation (Signed)
Physical Therapy Evaluation Patient Details Name: Wanda [name] MRN: [id number] DOB: [birth year] Today's Date: 07/11/2020   History of Present Illness  Wanda [name] is a 59 y.o. 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  Pt will have ortho intervention for closure during this admission.  Pt with significant PMH of recent COVID infection (off of precautions), chronic PAF, DM2, hypothyroidism, essential HTN, PVD, gout, CKD IV, Kidney transplant R 2013.   Clinical Impression  PT/OT co session with focus on in bed and EOB mobility.  Per pt she had not been working on EOB mobility at SNF due to getting dizzy every time she sits up.  Pt was able to sit up with two person assist, positioned for comfort and off loading wounds.  PT messaged MD for air mattress bed and Prevalon for her left heel.   PT to follow acutely for deficits listed below.      Follow Up Recommendations SNF    Equipment Recommendations  Wheelchair (measurements PT);Wheelchair cushion (measurements PT);Hospital bed;Other (comment) (hoyer lift, air mattress for bed)    Recommendations for Other Services       Precautions / Restrictions Precautions Precautions: Fall      Mobility  Bed Mobility Overal bed mobility: Needs Assistance Bed Mobility: Rolling;Sidelying to Sit;Sit to Supine Rolling: Mod assist;+2 for physical assistance Sidelying to sit: Mod assist;+2 for physical assistance   Sit to supine: Mod assist;+2 for physical assistance   General bed mobility comments: Mod assist to roll bil rolling left is easier due to less weight of her leg, assists with arms, but weakly, Heavy mod assist to support trunk to come to sitting EOB.  once stable in sitting pt able to support herself propped on both arms, but for limited period of time due to reports of "dizziness".  Assist needed at trunk and legs, pt helping with hands on bed rail to  return to supine.   Transfers                    Ambulation/Gait                Stairs            Wheelchair Mobility    Modified Rankin (Stroke Patients Only)       Balance Overall balance assessment: Needs assistance Sitting-balance support: Feet supported;Bilateral upper extremity supported Sitting balance-Leahy Scale: Poor Sitting balance - Comments: close supervision EOB, pt not able to remove hand from bed in sitting without assist from therapist at her trunk.  Postural control: Right lateral lean                                   Pertinent Vitals/Pain Pain Assessment: Faces Faces Pain Scale: Hurts even more Pain Location: generalized and  R LE Pain Descriptors / Indicators: Grimacing;Guarding Pain Intervention(s): Limited activity within patient's tolerance;Monitored during session;Repositioned    Home Living Family/patient expects to be discharged to:: Skilled nursing facility                      Prior Function Level of Independence: Needs assistance   Gait / Transfers Assistance Needed: pt reports at rehab she has been working on bed level exercises, she does not sit up because she becomes dizzy, so they have not transferred her to a  WC.   ADL's / Homemaking Assistance Needed: pt reports she is able to assist with upper body bathing from bed level at SNF.        Hand Dominance   Dominant Hand: Left    Extremity/Trunk Assessment   Upper Extremity Assessment Upper Extremity Assessment: Defer to OT evaluation    Lower Extremity Assessment Lower Extremity Assessment: RLE deficits/detail;LLE deficits/detail RLE Deficits / Details: right leg BKA and very weak, unable to lift leg against gravity unassisted. 2/5 LLE Deficits / Details: left leg with significant deep pressure wound to heel, asked MD for prevalon order, also very weak on this side, unable to lift against gravity unassisted 2/5    Cervical / Trunk  Assessment Cervical / Trunk Assessment: Other exceptions Cervical / Trunk Exceptions: C curve to the R (pt reports h/o scoliosis.   Communication   Communication: No difficulties  Cognition Arousal/Alertness: Awake/alert Behavior During Therapy: WFL for tasks assessed/performed Overall Cognitive Status:  (not specifically tested, grossly WNL)                                        General Comments General comments (skin integrity, edema, etc.): floated heels and rotated pt off of buttocks.  Asked MD for order for prevalon and air mattress bed.     Exercises     Assessment/Plan    PT Assessment Patient needs continued PT services  PT Problem List Decreased strength;Decreased activity tolerance;Decreased balance;Decreased mobility;Decreased range of motion;Decreased knowledge of use of DME;Pain;Decreased skin integrity       PT Treatment Interventions Functional mobility training;Therapeutic activities;Therapeutic exercise;Balance training;Patient/family education;Neuromuscular re-education;Wheelchair mobility training    PT Goals (Current goals can be found in the Care Plan section)  Acute Rehab PT Goals Patient Stated Goal: to get better PT Goal Formulation: With patient Time For Goal Achievement: 07/25/20 Potential to Achieve Goals: Fair    Frequency Min 2X/week   Barriers to discharge        Co-evaluation               AM-PAC PT "6 Clicks" Mobility  Outcome Measure Help needed turning from your back to your side while in a flat bed without using bedrails?: A Lot Help needed moving from lying on your back to sitting on the side of a flat bed without using bedrails?: A Lot Help needed moving to and from a bed to a chair (including a wheelchair)?: Total Help needed standing up from a chair using your arms (e.g., wheelchair or bedside chair)?: Total Help needed to walk in hospital room?: Total Help needed climbing 3-5 steps with a railing? :  Total 6 Click Score: 8    End of Session   Activity Tolerance: Patient limited by pain;Other (comment) (limited by dizziness in sitting EOB. ) Patient left: in bed;with call bell/phone within reach Nurse Communication: Mobility status;Other (comment) (asked for prevalon and air mattress bed) PT Visit Diagnosis: Muscle weakness (generalized) (M62.81);Difficulty in walking, not elsewhere classified (R26.2);Pain Pain - Right/Left: Right Pain - part of body: Leg    Time: 6629-4765 PT Time Calculation (min) (ACUTE ONLY): 27 min   Charges:   PT Evaluation $PT Eval Moderate Complexity: [place], PT, DPT  Acute Rehabilitation [phone] pager [phone]) [phone] office

## 2020-07-11 NOTE — Progress Notes (Signed)
PRN biscodyl 10mg  given po for constipation. Pt stated that constipation is normal for her and that at home she has to do a fleet enema. Pt bowels is right there.

## 2020-07-12 DIAGNOSIS — T8130XA Disruption of wound, unspecified, initial encounter: Secondary | ICD-10-CM | POA: Diagnosis not present

## 2020-07-12 LAB — GLUCOSE, CAPILLARY
Glucose-Capillary: 63 mg/dL — ABNORMAL LOW (ref 70–99)
Glucose-Capillary: 81 mg/dL (ref 70–99)
Glucose-Capillary: 85 mg/dL (ref 70–99)
Glucose-Capillary: 79 mg/dL (ref 70–99)
Glucose-Capillary: 90 mg/dL (ref 70–99)

## 2020-07-12 LAB — BASIC METABOLIC PANEL
Anion gap: 8 (ref 5–15)
BUN: 45 mg/dL — ABNORMAL HIGH (ref 6–20)
CO2: 17 mmol/L — ABNORMAL LOW (ref 22–32)
Calcium: 8.1 mg/dL — ABNORMAL LOW (ref 8.9–10.3)
Chloride: 118 mmol/L — ABNORMAL HIGH (ref 98–111)
Creatinine, Ser: 2.74 mg/dL — ABNORMAL HIGH (ref 0.44–1.00)
GFR, Estimated: 18 mL/min — ABNORMAL LOW (ref >60)
Glucose, Bld: 76 mg/dL (ref 70–99)
Potassium: 5.3 mmol/L — ABNORMAL HIGH (ref 3.5–5.1)
Sodium: 143 mmol/L (ref 135–145)

## 2020-07-12 LAB — CULTURE, BLOOD (ROUTINE X 2)
Culture: NO GROWTH
Culture: NO GROWTH
Special Requests: ADEQUATE

## 2020-07-12 MED ORDER — SODIUM ZIRCONIUM CYCLOSILICATE 5 G PO PACK
5.0000 g | PACK | Freq: Every day | ORAL | Status: DC
  Administered 2020-07-12 – 2020-07-13 (×2): 5 g via ORAL
  Filled 2020-07-12 (×2): qty 1

## 2020-07-12 MED ORDER — ALUM & MAG HYDROXIDE-SIMETH 200-200-20 MG/5ML PO SUSP
30.0000 mL | ORAL | Status: DC | PRN
  Administered 2020-07-12: 30 mL via ORAL
  Filled 2020-07-12: qty 30

## 2020-07-12 NOTE — Progress Notes (Signed)
Wanda [name]  SWN:[id number] DOB: 15-Nov-1960 DOA: 07/21/2020 PCP: Lorene Dy, MD    Brief Narrative:  [phone] with a history of paroxysmal atrial fibrillation on Eliquis, CKD stage IV status post renal transplant, anemia of chronic kidney disease, gastric sleeve, hypoalbuminemia, DM2, HTN, hypothyroidism, GERD, peripheral arterial disease, and chronic neuropathy who was admitted to the hospital 05/01/2020-06/01/2020 in septic shock secondary to a right foot ulcer with Klebsiella bacteremia complicated by endocarditis.  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  She returned to the ED 10/5 with a 1 week history of drainage and dehiscence of the stump site of her right lower extremity.  In the ED the patient was found to have an elevated WBC.  X-ray right tibia/fibula was without evidence of osseous abnormality.  She was found to be hyperkalemic at 6.0 and in acute renal failure with creatinine 3.4 up from baseline of 2.4.    Significant Events:  7/30 > 8/30 admit right foot cellulitis and Klebsiella bacteremia > R BKA 10/5 admit w/ AKI, hyperkalemia, and wound dehiscence   Antimicrobials:  none  DVT prophylaxis: Eliquis presently on hold for surgery  Subjective: Afebrile.  Vital signs stable.  Potassium slowly trending downward.  Assessment & Plan:  R BKA stump site dehiscence X-ray without evidence to suggest osteomyelitis -finished a prolonged course of IV meropenem 9/25 -blood cultures without growth thus far - Ortho has formally consulted and will plan more extensive wound closure Tuesday assuming medical issues have stabilized - last dose of Eliquis 10/9  Recent COVID Was diagnosed with Covid 9/10 while at [facility] and quarantined for 14 days - last day of quarantine was 10/1 - CXR notes resolved bilateral lung opacities compared to previous exam -entirely asymptomatic from  this regard -no indication for isolation  Acute kidney injury on CKD stage IV status post renal transplant Baseline creatinine 2.1-2.4 -creatinine 3.4 at presentation -Nephrology reviewed and suggested volume resuscitation and close monitoring -creatinine slowly improving   Recent Labs  Lab 07/08/20 1354 07/19/2020 0412 07/10/20 0142 07/11/20 0225 07/12/20 0226  CREATININE 3.17* 3.08* 2.86* 2.79* 2.74*    Hyperkalemia Changed to [facility] 10/9 - increased Na bicarb - appears to be improving at this time   Recent Labs  Lab 07/08/20 1354 07/28/2020 0412 07/10/20 0142 07/11/20 0225 07/12/20 0226  K 5.6* 5.5* 5.5* 5.8* 5.3*    Hypotension Likely simply due to volume depletion -blood pressure has improved with ongoing volume resuscitation -random serum cortisol not low  Chronic paroxysmal atrial fibrillation Continue home amiodarone and Eliquis - rate controlled in NSR  DM 2 - controlled but with chronic neuropathy Does not require home medical therapy -hypoglycemia resolved and CBG stable without insulin therapy  Hypothyroidism Reported history of such but is not on thyroid hormone replacement - TSH signif elevated at 23.73 - free T4 actually normal at 0.8 - cont w/o supplementation for now - suggest recheck of TSH in 6-8 weeks   Severe malnutrition in context of chronic illness Nutrition consulted  Code Status: FULL CODE Family Communication: Spoke with daughter at bedside Status is: Inpatient  Remains inpatient appropriate because:Persistent severe electrolyte disturbances   Dispo: The patient is from: SNF              Anticipated d/c is to: SNF              Anticipated d/c date is:  3 days              Patient currently is not medically stable to d/c.   Consultants:  none  Objective: Blood pressure 122/60, pulse 89, temperature 98.1 F (36.7 C), temperature source Oral, resp. rate 16, height 5\' 9"  (1.753 m), weight 50.9 kg, SpO2 100 %.  Intake/Output Summary  (Last 24 hours) at 07/12/2020 0824 Last data filed at 07/12/2020 0500 Gross per 24 hour  Intake 2244.91 ml  Output 100 ml  Net 2144.91 ml   Filed Weights   07/08/20 1130  Weight: 50.9 kg    Examination: General: No acute respiratory distress Lungs: Clear to auscultation B Cardiovascular: RRR- no M or rub Abdomen: Thin, soft, BS positive, no rebound Extremities: No edema bilaterally  CBC: Recent Labs  Lab 07/04/2020 1633 07/23/2020 1633 07/08/20 0013 07/18/2020 0412 07/10/20 0142  WBC 14.1*   < > 15.2* 12.6* 13.7*  NEUTROABS 13.2*  --   --   --   --   HGB 12.4   < > 13.2 10.4* 10.8*  HCT 40.7   < > 42.4 33.0* 34.7*  MCV 93.6   < > 93.8 91.4 91.3  PLT 196   < > 158 185 186   < > = values in this interval not displayed.   Basic Metabolic Panel: Recent Labs  Lab 07/03/2020 1633 07/31/2020 2122 07/08/20 0013 07/08/20 1354 07/13/2020 0412 07/13/2020 0412 07/10/20 0142 07/11/20 0225 07/12/20 0226  NA   < >  --  139   < > 142   < > 141 141 143  K   < > 5.9* 5.4*   < > 5.5*   < > 5.5* 5.8* 5.3*  CL   < >  --  112*   < > 115*   < > 116* 116* 118*  CO2   < >  --  18*   < > 19*   < > 16* 16* 17*  GLUCOSE   < >  --  68*   < > 80   < > 99 80 76  BUN   < >  --  46*   < > 43*   < > 44* 43* 45*  CREATININE   < >  --  3.15*   < > 3.08*   < > 2.86* 2.79* 2.74*  CALCIUM   < >  --  7.7*   < > 8.1*   < > 8.0* 8.0* 8.1*  MG  --  2.1  --   --  2.1  --  2.1  --   --   PHOS  --   --  3.6  --   --   --   --   --   --    < > = values in this interval not displayed.   GFR: Estimated Creatinine Clearance: 17.8 mL/min (A) (by C-G formula based on SCr of 2.74 mg/dL (H)).  Liver Function Tests: Recent Labs  Lab 07/21/2020 1633 07/08/20 0013 07/06/2020 0412 07/10/20 0142  AST 62*  --  52* 40  ALT 30  --  34 30  ALKPHOS 568*  --  547* 520*  BILITOT 0.9  --  0.8 0.7  PROT 5.9*  --  5.1* 5.3*  ALBUMIN 1.6* 1.4* 1.3* 1.3*    HbA1C: Hgb A1c MFr Bld  Date/Time Value Ref Range Status  07/22/2020  04:12 AM 4.2 (L) 4.8 - 5.6 % Final    Comment:    (  NOTE) Pre diabetes:          5.7%-6.4%  Diabetes:              >6.4%  Glycemic control for   <7.0% adults with diabetes   05/02/2020 01:06 AM 4.1 (L) 4.8 - 5.6 % Final    Comment:    (NOTE) Pre diabetes:          5.7%-6.4%  Diabetes:              >6.4%  Glycemic control for   <7.0% adults with diabetes     CBG: Recent Labs  Lab 07/11/20 0812 07/11/20 1153 07/11/20 1816 07/11/20 2038 07/12/20 0808  GLUCAP 95 82 83 90 63*    Recent Results (from the past 240 hour(s))  Culture, blood (routine x 2)     Status: None (Preliminary result)   Collection Time: 07/28/2020  5:00 PM   Specimen: BLOOD  Result Value Ref Range Status   Specimen Description BLOOD LEFT ANTECUBITAL  Final   Special Requests   Final    BOTTLES DRAWN AEROBIC AND ANAEROBIC Blood Culture results may not be optimal due to an inadequate volume of blood received in culture bottles   Culture   Final    NO GROWTH 4 DAYS Performed at [facility], [place] [address], [place], [place] [address]    Report Status PENDING  Incomplete  Culture, blood (routine x 2)     Status: None (Preliminary result)   Collection Time: 07/10/2020  6:06 PM   Specimen: BLOOD  Result Value Ref Range Status   Specimen Description BLOOD LEFT ANTECUBITAL  Final   Special Requests   Final    BOTTLES DRAWN AEROBIC AND ANAEROBIC Blood Culture adequate volume   Culture   Final    NO GROWTH 4 DAYS Performed at [facility], [place] [address], [place], [place] [address]    Report Status PENDING  Incomplete  Respiratory Panel by RT PCR (Flu A&B, Covid) - Nasopharyngeal Swab     Status: Abnormal   Collection Time: 07/06/2020  9:00 PM   Specimen: Nasopharyngeal Swab  Result Value Ref Range Status   SARS Coronavirus 2 by RT PCR POSITIVE (A) NEGATIVE Final    Comment: RESULT CALLED TO, READ BACK BY AND VERIFIED WITH: B ROUNDTREE RN 07/30/2020 2217 JDW (NOTE) SARS-CoV-2 target nucleic  acids are DETECTED.  SARS-CoV-2 RNA is generally detectable in upper respiratory specimens  during the acute phase of infection. Positive results are indicative of the presence of the identified virus, but do not rule out bacterial infection or co-infection with other pathogens not detected by the test. Clinical correlation with patient history and other diagnostic information is necessary to determine patient infection status. The expected result is Negative.  Fact Sheet for Patients:  PinkCheek.be  Fact Sheet for Healthcare Providers: GravelBags.it  This test is not yet approved or cleared by the [place] FDA and  has been authorized for detection and/or diagnosis of SARS-CoV-2 by FDA under an Emergency Use Authorization (EUA).  This EUA will remain in effect (meaning this test can be use d) for the duration of  the COVID-19 declaration under Section 564(b)(1) of the Act, 21 U.S.C. section 360bbb-3(b)(1), unless the authorization is terminated or revoked sooner.      Influenza A by PCR NEGATIVE NEGATIVE Final   Influenza B by PCR NEGATIVE NEGATIVE Final    Comment: (NOTE) The Xpert Xpress SARS-CoV-2/FLU/RSV assay is intended as an aid  in  the diagnosis of influenza from Nasopharyngeal swab specimens and  should not be used as a sole basis for treatment. Nasal washings and  aspirates are unacceptable for Xpert Xpress SARS-CoV-2/FLU/RSV  testing.  Fact Sheet for Patients: PinkCheek.be  Fact Sheet for Healthcare Providers: GravelBags.it  This test is not yet approved or cleared by the [place] FDA and  has been authorized for detection and/or diagnosis of SARS-CoV-2 by  FDA under an Emergency Use Authorization (EUA). This EUA will remain  in effect (meaning this test can be used) for the duration of the  Covid-19 declaration under Section 564(b)(1) of  the Act, 21  U.S.C. section 360bbb-3(b)(1), unless the authorization is  terminated or revoked. Performed at [facility], [place] [address] [place], [place] [address]   MRSA PCR Screening     Status: None   Collection Time: 07/24/2020  1:10 PM   Specimen: Nasal Mucosa; Nasopharyngeal  Result Value Ref Range Status   MRSA by PCR NEGATIVE NEGATIVE Final    Comment:        The GeneXpert MRSA Assay (FDA approved for NASAL specimens only), is one component of a comprehensive MRSA colonization surveillance program. It is not intended to diagnose MRSA infection nor to guide or monitor treatment for MRSA infections. Performed at [facility], [place] [address], [place], [place] [address]      Scheduled Meds: . (feeding supplement) PROSource Plus  30 mL Oral TID BM  . amiodarone  200 mg Oral Daily  . ascorbic acid  250 mg Oral BID  . cholecalciferol  1,000 Units Oral Daily  . docusate sodium  100 mg Oral BID  . feeding supplement (ENSURE ENLIVE)  237 mL Oral TID WC  . folic acid  1 mg Oral Daily  . hydrocortisone  25 mg Rectal BID  . mirtazapine  7.5 mg Oral QHS  . multivitamin with minerals  1 tablet Oral Daily  . nortriptyline  25 mg Oral QHS  . pantoprazole  40 mg Oral Daily  . predniSONE  5 mg Oral Daily  . ramelteon  8 mg Oral QHS  . sodium bicarbonate  650 mg Oral TID  . tacrolimus  1.5 mg Oral Q12H  . thiamine  100 mg Oral Daily  . zinc sulfate  220 mg Oral Daily   Continuous Infusions: . sodium chloride 75 mL/hr at 07/12/20 0359     LOS: 4 days   Cherene Altes, MD [facility] Office  [phone] Pager - Text Page per Shea Evans  If 7PM-7AM, please contact night-coverage per Amion 07/12/2020, 8:24 AM

## 2020-07-12 NOTE — Plan of Care (Signed)
Problem: Education: Goal: Knowledge of General Education information will improve Description: Including pain rating scale, medication(s)/side effects and non-pharmacologic comfort measures Outcome: Progressing   Problem: Elimination: Goal: Will not experience complications related to bowel motility Outcome: Not Met (add Reason) Goal: Will not experience complications related to urinary retention Outcome: Progressing   Problem: Safety: Goal: Ability to remain free from injury will improve Outcome: Progressing   Problem: Skin Integrity: Goal: Risk for impaired skin integrity will decrease Outcome: Progressing

## 2020-07-13 ENCOUNTER — Other Ambulatory Visit (HOSPITAL_COMMUNITY): Payer: Self-pay | Admitting: Orthopedic Surgery

## 2020-07-13 DIAGNOSIS — T8130XA Disruption of wound, unspecified, initial encounter: Secondary | ICD-10-CM | POA: Diagnosis not present

## 2020-07-13 LAB — BASIC METABOLIC PANEL
Anion gap: 6 (ref 5–15)
BUN: 47 mg/dL — ABNORMAL HIGH (ref 6–20)
CO2: 19 mmol/L — ABNORMAL LOW (ref 22–32)
Calcium: 8.1 mg/dL — ABNORMAL LOW (ref 8.9–10.3)
Chloride: 119 mmol/L — ABNORMAL HIGH (ref 98–111)
Creatinine, Ser: 2.85 mg/dL — ABNORMAL HIGH (ref 0.44–1.00)
GFR, Estimated: 17 mL/min — ABNORMAL LOW (ref >60)
Glucose, Bld: 76 mg/dL (ref 70–99)
Potassium: 5.4 mmol/L — ABNORMAL HIGH (ref 3.5–5.1)
Sodium: 144 mmol/L (ref 135–145)

## 2020-07-13 LAB — GLUCOSE, CAPILLARY
Glucose-Capillary: 71 mg/dL (ref 70–99)
Glucose-Capillary: 87 mg/dL (ref 70–99)
Glucose-Capillary: 88 mg/dL (ref 70–99)
Glucose-Capillary: 82 mg/dL (ref 70–99)

## 2020-07-13 MED ORDER — MAGNESIUM CITRATE PO SOLN
0.5000 | Freq: Once | ORAL | Status: AC | PRN
  Administered 2020-07-13: 0.5 via ORAL
  Filled 2020-07-13: qty 296

## 2020-07-13 MED ORDER — POLYETHYLENE GLYCOL 3350 17 G PO PACK
17.0000 g | PACK | Freq: Two times a day (BID) | ORAL | Status: DC | PRN
  Administered 2020-07-13: 17 g via ORAL

## 2020-07-13 MED ORDER — SENNOSIDES-DOCUSATE SODIUM 8.6-50 MG PO TABS
1.0000 | ORAL_TABLET | Freq: Two times a day (BID) | ORAL | Status: DC
  Administered 2020-07-13 – 2020-07-19 (×12): 1 via ORAL
  Filled 2020-07-13 (×12): qty 1

## 2020-07-13 MED ORDER — SODIUM ZIRCONIUM CYCLOSILICATE 5 G PO PACK
5.0000 g | PACK | Freq: Two times a day (BID) | ORAL | Status: DC
  Administered 2020-07-13 – 2020-07-15 (×2): 5 g via ORAL
  Filled 2020-07-13 (×6): qty 1

## 2020-07-13 MED ORDER — BISACODYL 10 MG RE SUPP
10.0000 mg | Freq: Every day | RECTAL | Status: DC | PRN
  Administered 2020-07-16: 10 mg via RECTAL
  Filled 2020-07-13: qty 1

## 2020-07-13 NOTE — TOC Initial Note (Signed)
Transition of Care [facility]) - Initial/Assessment Note    Patient Details  Name: Wanda [name] MRN: [id number] Date of Birth: [birth year]  Transition of Care [facility]) CM/SW Contact:    Joanne Chars, LCSW Phone Number: 07/13/2020, 2:23 PM  Clinical Narrative:    CSW spoke with pt regarding DC recommendation for SNF.  Pt just left [facility] and would be using copay days, discussed the cost of this and pt does not thinks she can afford this and would like to plan on return home with [facility].  Pt reports she lives with her husband, daughter, son in law, and their two children.  Pt reports she would have 24/7 support in the home.  Pt gives permission for CSW to contact daughter and HH, Jack[facility]ul[facility] [place] (put in place by [facility] when she left)  Pt reports she has hospital bed, walker, wheel chair, shower chair.  Pt is not vaccinated for covid.  PCP in place.   1415: CSW spoke with daughter [name] who confirms pt has good family support and agrees with plan for [facility].  They did not actually start with [facility] because pt came directly back to hospital after DC from [facility], but they are fine to start at this time.  She asked about wound care specifically.  1425: CSW spoke with [place] at [place]. She does still have the referral and can start at DC.  Planning on PT/OT/RN/aide, CSW.               Expected Discharge Plan: [place] Barriers to Discharge: Continued Medical Work up   Patient Goals and CMS Choice Patient states their goals for this hospitalization and ongoing recovery are:: walk, improve strength      Expected Discharge Plan and Services Expected Discharge Plan: [place] arrangements for the past 2 months: Single Family Home                           HH Arranged: RN, PT, OT, Nurse's Aide, Refused SNF [place] Agency: [facility] Date [place]: 07/13/20 Time [place]: 1421 Representative  spoke with at [place]: [place]  Prior Living Arrangements/Services Living arrangements for the past 2 months: [place] with:: Adult Children, Spouse Patient language and need for interpreter reviewed:: Yes        Need for Family Participation in Patient Care: Yes (Comment) Care giver support system in place?: Yes (comment) Current home services: Homehealth aide, Home OT, Home PT, Home RN Criminal Activity/Legal Involvement Pertinent to Current Situation/Hospitalization: No - Comment as needed  Activities of Daily Sac Devices/Equipment: Cane (specify quad or straight), Hospital bed, Wheelchair, Environmental consultant (specify type), Shower chair with back (straight cane, walker- 4 wheels) ADL Screening (condition at time of admission) Patient's cognitive ability adequate to safely complete daily activities?: Yes Is the patient deaf or have difficulty hearing?: No Does the patient have difficulty seeing, even when wearing glasses/contacts?: No Does the patient have difficulty concentrating, remembering, or making decisions?: No Patient able to express need for assistance with ADLs?: Yes Does the patient have difficulty dressing or bathing?: Yes Independently performs ADLs?: No Communication: Independent Dressing (OT): Independent Grooming: Independent Feeding: Independent Bathing: Needs assistance Is this a change from baseline?: Pre-admission baseline Toileting: Needs assistance Is this a change from baseline?: Pre-admission baseline In/Out Bed: Needs assistance Is this a change  from baseline?: Pre-admission baseline Walks in Home: Dependent, Needs assistance Is this a change from baseline?: Pre-admission baseline Does the patient have difficulty walking or climbing stairs?: Yes Weakness of Legs: Both Weakness of Arms/Hands: Both  Permission Sought/Granted Permission sought to share information with : Facility Sport and exercise psychologist, Family  Supports Permission granted to share information with : Yes, Verbal Permission Granted  Share Information with NAME: daughter [name], husband [name]  Permission granted to share info w AGENCY: [facility]        Emotional Assessment Appearance:: Appears stated age Attitude/Demeanor/Rapport: Engaged Affect (typically observed): Pleasant Orientation: : Oriented to Self, Oriented to Place, Oriented to  Time, Oriented to Situation Alcohol / Substance Use: Not Applicable Psych Involvement: No (comment)  Admission diagnosis:  Hyperkalemia [E87.5] AKI (acute kidney injury) ([place]) [N17.9] Hypotension, unspecified hypotension type [I95.9] COVID-19 virus infection [U07.1] Patient Active Problem List   Diagnosis Date Noted  . Wound dehiscence 07/03/2020  . Hyperkalemia 07/21/2020  . Metabolic acidosis 40/81/4481  . Suspected endocarditis 06/23/2020  . Bacteremia due to Klebsiella pneumoniae 05/21/2020  . Pain of foot   . Palliative care by specialist   . Goals of care, counseling/discussion   . DNR (do not resuscitate) discussion   . Failure to thrive in adult   . Pressure injury of skin 05/06/2020  . Wound infection 05/01/2020  . Leukocytosis 05/01/2020  . Protein-calorie malnutrition, severe ([place]) 02/05/2020  . AKI (acute kidney injury) ([place]) 02/04/2020  . A-V fistula ([place]) 02/08/2018  . 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 Peritonitis ([place]) 10/07/2016  . Atrial fibrillation ([place]) 02/24/2016  . Depression 02/24/2016  . Stroke ([place]) 02/24/2016  . Morbid obesity ([place]) 05/26/2015  . Burn any degree involving less than 10 percent of body surface 04/21/2015  . Neuropathic pain 04/21/2015  . Peripheral neuropathy 04/21/2015  . Nausea and vomiting 01/08/2013  . Renal failure (ARF), acute on chronic (HCC) 01/08/2013  . Renal transplant disorder 09/04/2012  . CKD (chronic kidney disease) 09/04/2012  . HTN (hypertension) 09/04/2012  . Anemia 09/04/2012  . Urinary retention  07/16/2012  . History of kidney transplant 05/18/2012  . Acquired absence of other specified parts of digestive tract 05/17/2012  . Cough 03/22/2011  . SHINGLES 12/21/2009  . HYPOTHYROIDISM 12/21/2009  . DM (diabetes mellitus), type 2 with renal complications ([place]) 85/63/1497  . HYPERLIPIDEMIA 12/21/2009  . OBESITY 12/21/2009  . DYSPNEA 12/21/2009  . MIGRAINES, HX OF 12/21/2009  . Essential hypertension 11/20/2009  . CHRONIC KIDNEY DISEASE STAGE IV ([place]) 11/20/2009  . DYSPNEA ON EXERTION 11/20/2009  . NONSPECIFIC ABNORMAL UNSPEC CV FUNCTION STUDY 11/20/2009   PCP:  Lorene Dy, MD Pharmacy:  No Pharmacies Listed    Social Determinants of Health (SDOH) Interventions    Readmission Risk Interventions Readmission Risk Prevention Plan 02/06/2020  Transportation Screening Complete  PCP or Specialist Appt within 3-5 Days Complete  HRI or [place] Complete  Social Work Consult for [place] Planning/Counseling Complete  Palliative Care Screening Not Applicable  Medication Review Press photographer) Complete  Some recent data might be hidden

## 2020-07-13 NOTE — Care Management Important Message (Signed)
Important Message  Patient Details  Name: Wanda [name] MRN: [id number] Date of Birth: Nov 12, 1960   Medicare Important Message Given:  Yes     Orbie Pyo 07/13/2020, 12:26 PM

## 2020-07-13 NOTE — Progress Notes (Signed)
Wanda [name]  HKV:[id number] DOB: 1961-10-03 DOA: 07/31/2020 PCP: Lorene Dy, MD    Brief Narrative:  [phone] with a history of paroxysmal atrial fibrillation on Eliquis, CKD stage IV status post renal transplant, anemia of chronic kidney disease, gastric sleeve, hypoalbuminemia, DM2, HTN, hypothyroidism, GERD, peripheral arterial disease, and chronic neuropathy who was admitted to the hospital 05/01/2020-06/01/2020 in septic shock secondary to a right foot ulcer with Klebsiella bacteremia complicated by endocarditis.  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  She returned to the ED 10/5 with a 1 week history of drainage and dehiscence of the stump site of her right lower extremity.  In the ED the patient was found to have an elevated WBC.  X-ray right tibia/fibula was without evidence of osseous abnormality.  She was found to be hyperkalemic at 6.0 and in acute renal failure with creatinine 3.4 up from baseline of 2.4.    Significant Events:  7/30 > 8/30 admit right foot cellulitis and Klebsiella bacteremia > R BKA 10/5 admit w/ AKI, hyperkalemia, and wound dehiscence   Antimicrobials:  none  DVT prophylaxis: Eliquis presently on hold for surgery  Subjective: Afebrile. Vital signs are stable. Potassium is stable. Renal function holding steady though not quite back to baseline. Medically appears optimized for pending surgery.  Assessment & Plan:  R BKA stump site dehiscence X-ray without evidence to suggest osteomyelitis - finished a prolonged course of IV meropenem 9/25 -blood cultures without growth this admission - Ortho has formally consulted and will plan more extensive wound closure Tuesday assuming medical issues have stabilized - last dose of Eliquis 10/9  Recent COVID Was diagnosed with Covid 9/10 while at [facility] and quarantined for 14 days - last day of quarantine  was 10/1 - CXR notes resolved bilateral lung opacities compared to previous exam -entirely asymptomatic from this regard -no indication for isolation  Acute kidney injury on CKD stage IV status post renal transplant Baseline creatinine 2.1-2.4 -creatinine 3.4 at presentation -Nephrology reviewed and suggested volume resuscitation and close monitoring -creatinine stable  Recent Labs  Lab 07/20/2020 0412 07/10/20 0142 07/11/20 0225 07/12/20 0226 07/13/20 0250  CREATININE 3.08* 2.86* 2.79* 2.74* 2.85*    Hyperkalemia Changed to [facility] 10/9 - increased Na bicarb - appears to be stable presently -increased dose of Lokelma to provide a little more wiggle room  Recent Labs  Lab 07/13/2020 0412 07/10/20 0142 07/11/20 0225 07/12/20 0226 07/13/20 0250  K 5.5* 5.5* 5.8* 5.3* 5.4*    Hypotension Likely simply due to volume depletion -blood pressure has improved with ongoing volume resuscitation -random serum cortisol not low  Chronic paroxysmal atrial fibrillation Continue home amiodarone and Eliquis - rate controlled in NSR  DM 2 - controlled but with chronic neuropathy Does not require home medical therapy -hypoglycemia resolved and CBG stable without insulin therapy  Hypothyroidism Reported history of such but is not on thyroid hormone replacement - TSH signif elevated at 23.73 - free T4 actually normal at 0.8 - cont w/o supplementation for now - suggest recheck of TSH in 6-8 weeks   Severe malnutrition in context of chronic illness Nutrition consulted  Code Status: FULL CODE Family Communication: Spoke with daughter at bedside Status is: Inpatient  Remains inpatient appropriate because:Persistent severe electrolyte disturbances   Dispo: The patient is from: SNF              Anticipated  d/c is to: SNF              Anticipated d/c date is: 3 days              Patient currently is not medically stable to d/c.   Consultants:  none  Objective: Blood pressure 122/67, pulse  88, temperature (!) 97.4 F (36.3 C), temperature source Oral, resp. rate 17, height 5\' 9"  (1.753 m), weight 50.9 kg, SpO2 100 %.  Intake/Output Summary (Last 24 hours) at 07/13/2020 0758 Last data filed at 07/13/2020 0600 Gross per 24 hour  Intake 1826.02 ml  Output 450 ml  Net 1376.02 ml   Filed Weights   07/08/20 1130  Weight: 50.9 kg    Examination: General: No acute respiratory distress Lungs: Clear to auscultation B without wheezing Cardiovascular: RRR Abdomen: Thin, soft, BS positive, no rebound Extremities: No edema B  CBC: Recent Labs  Lab 07/11/2020 1633 07/14/2020 1633 07/08/20 0013 07/08/2020 0412 07/10/20 0142  WBC 14.1*   < > 15.2* 12.6* 13.7*  NEUTROABS 13.2*  --   --   --   --   HGB 12.4   < > 13.2 10.4* 10.8*  HCT 40.7   < > 42.4 33.0* 34.7*  MCV 93.6   < > 93.8 91.4 91.3  PLT 196   < > 158 185 186   < > = values in this interval not displayed.   Basic Metabolic Panel: Recent Labs  Lab 07/14/2020 1633 07/23/2020 2122 07/08/20 0013 07/08/20 1354 07/28/2020 9562 07/23/2020 1308 07/10/20 0142 07/10/20 0142 07/11/20 0225 07/12/20 0226 07/13/20 0250  NA   < >  --  139   < > 142   < > 141   < > 141 143 144  K   < > 5.9* 5.4*   < > 5.5*   < > 5.5*   < > 5.8* 5.3* 5.4*  CL   < >  --  112*   < > 115*   < > 116*   < > 116* 118* 119*  CO2   < >  --  18*   < > 19*   < > 16*   < > 16* 17* 19*  GLUCOSE   < >  --  68*   < > 80   < > 99   < > 80 76 76  BUN   < >  --  46*   < > 43*   < > 44*   < > 43* 45* 47*  CREATININE   < >  --  3.15*   < > 3.08*   < > 2.86*   < > 2.79* 2.74* 2.85*  CALCIUM   < >  --  7.7*   < > 8.1*   < > 8.0*   < > 8.0* 8.1* 8.1*  MG  --  2.1  --   --  2.1  --  2.1  --   --   --   --   PHOS  --   --  3.6  --   --   --   --   --   --   --   --    < > = values in this interval not displayed.   GFR: Estimated Creatinine Clearance: 17.1 mL/min (A) (by C-G formula based on SCr of 2.85 mg/dL (H)).  Liver Function Tests: Recent Labs  Lab  07/03/2020 1633 07/08/20 0013 07/23/2020 0412 07/10/20 0142  AST  62*  --  52* 40  ALT 30  --  34 30  ALKPHOS 568*  --  547* 520*  BILITOT 0.9  --  0.8 0.7  PROT 5.9*  --  5.1* 5.3*  ALBUMIN 1.6* 1.4* 1.3* 1.3*    HbA1C: Hgb A1c MFr Bld  Date/Time Value Ref Range Status  07/08/2020 04:12 AM 4.2 (L) 4.8 - 5.6 % Final    Comment:    (NOTE) Pre diabetes:          5.7%-6.4%  Diabetes:              >6.4%  Glycemic control for   <7.0% adults with diabetes   05/02/2020 01:06 AM 4.1 (L) 4.8 - 5.6 % Final    Comment:    (NOTE) Pre diabetes:          5.7%-6.4%  Diabetes:              >6.4%  Glycemic control for   <7.0% adults with diabetes     CBG: Recent Labs  Lab 07/12/20 0808 07/12/20 0940 07/12/20 1139 07/12/20 1614 07/12/20 2119  GLUCAP 63* 81 85 79 90    Recent Results (from the past 240 hour(s))  Culture, blood (routine x 2)     Status: None   Collection Time: 07/06/2020  5:00 PM   Specimen: BLOOD  Result Value Ref Range Status   Specimen Description BLOOD LEFT ANTECUBITAL  Final   Special Requests   Final    BOTTLES DRAWN AEROBIC AND ANAEROBIC Blood Culture results may not be optimal due to an inadequate volume of blood received in culture bottles   Culture   Final    NO GROWTH 5 DAYS Performed at [facility], [place] [address], [place], [place] [address]    Report Status 07/12/2020 FINAL  Final  Culture, blood (routine x 2)     Status: None   Collection Time: 07/04/2020  6:06 PM   Specimen: BLOOD  Result Value Ref Range Status   Specimen Description BLOOD LEFT ANTECUBITAL  Final   Special Requests   Final    BOTTLES DRAWN AEROBIC AND ANAEROBIC Blood Culture adequate volume   Culture   Final    NO GROWTH 5 DAYS Performed at [facility], [place] [address] [place], [place] [address]    Report Status 07/12/2020 FINAL  Final  Respiratory Panel by RT PCR (Flu A&B, Covid) - Nasopharyngeal Swab     Status: Abnormal   Collection Time: 07/06/2020  9:00 PM    Specimen: Nasopharyngeal Swab  Result Value Ref Range Status   SARS Coronavirus 2 by RT PCR POSITIVE (A) NEGATIVE Final    Comment: RESULT CALLED TO, READ BACK BY AND VERIFIED WITH: B ROUNDTREE RN 07/30/2020 2217 JDW (NOTE) SARS-CoV-2 target nucleic acids are DETECTED.  SARS-CoV-2 RNA is generally detectable in upper respiratory specimens  during the acute phase of infection. Positive results are indicative of the presence of the identified virus, but do not rule out bacterial infection or co-infection with other pathogens not detected by the test. Clinical correlation with patient history and other diagnostic information is necessary to determine patient infection status. The expected result is Negative.  Fact Sheet for Patients:  PinkCheek.be  Fact Sheet for Healthcare Providers: GravelBags.it  This test is not yet approved or cleared by the [place] FDA and  has been authorized for detection and/or diagnosis of SARS-CoV-2 by FDA under an Emergency Use Authorization (EUA).  This  EUA will remain in effect (meaning this test can be use d) for the duration of  the COVID-19 declaration under Section 564(b)(1) of the Act, 21 U.S.C. section 360bbb-3(b)(1), unless the authorization is terminated or revoked sooner.      Influenza A by PCR NEGATIVE NEGATIVE Final   Influenza B by PCR NEGATIVE NEGATIVE Final    Comment: (NOTE) The Xpert Xpress SARS-CoV-2/FLU/RSV assay is intended as an aid in  the diagnosis of influenza from Nasopharyngeal swab specimens and  should not be used as a sole basis for treatment. Nasal washings and  aspirates are unacceptable for Xpert Xpress SARS-CoV-2/FLU/RSV  testing.  Fact Sheet for Patients: PinkCheek.be  Fact Sheet for Healthcare Providers: GravelBags.it  This test is not yet approved or cleared by the [place] FDA and   has been authorized for detection and/or diagnosis of SARS-CoV-2 by  FDA under an Emergency Use Authorization (EUA). This EUA will remain  in effect (meaning this test can be used) for the duration of the  Covid-19 declaration under Section 564(b)(1) of the Act, 21  U.S.C. section 360bbb-3(b)(1), unless the authorization is  terminated or revoked. Performed at [facility], [place] [address] [place], [place] [address]   MRSA PCR Screening     Status: None   Collection Time: 07/30/2020  1:10 PM   Specimen: Nasal Mucosa; Nasopharyngeal  Result Value Ref Range Status   MRSA by PCR NEGATIVE NEGATIVE Final    Comment:        The GeneXpert MRSA Assay (FDA approved for NASAL specimens only), is one component of a comprehensive MRSA colonization surveillance program. It is not intended to diagnose MRSA infection nor to guide or monitor treatment for MRSA infections. Performed at [facility], [place] [address], [place], [place] [address]      Scheduled Meds: . (feeding supplement) PROSource Plus  30 mL Oral TID BM  . amiodarone  200 mg Oral Daily  . ascorbic acid  250 mg Oral BID  . cholecalciferol  1,000 Units Oral Daily  . docusate sodium  100 mg Oral BID  . feeding supplement (ENSURE ENLIVE)  237 mL Oral TID WC  . folic acid  1 mg Oral Daily  . hydrocortisone  25 mg Rectal BID  . mirtazapine  7.5 mg Oral QHS  . multivitamin with minerals  1 tablet Oral Daily  . nortriptyline  25 mg Oral QHS  . pantoprazole  40 mg Oral Daily  . predniSONE  5 mg Oral Daily  . ramelteon  8 mg Oral QHS  . sodium bicarbonate  650 mg Oral TID  . sodium zirconium cyclosilicate  5 g Oral Daily  . tacrolimus  1.5 mg Oral Q12H  . thiamine  100 mg Oral Daily  . zinc sulfate  220 mg Oral Daily   Continuous Infusions: . sodium chloride 50 mL/hr at 07/13/20 0600     LOS: 5 days   Cherene Altes, MD [facility] Office  [phone] Pager - Text Page per Shea Evans  If 7PM-7AM,  please contact night-coverage per Amion 07/13/2020, 7:58 AM

## 2020-07-13 NOTE — Plan of Care (Signed)
°  Problem: Education: °Goal: Knowledge of General Education information will improve °Description: Including pain rating scale, medication(s)/side effects and non-pharmacologic comfort measures °Outcome: Progressing °  °Problem: Health Behavior/Discharge Planning: °Goal: Ability to manage health-related needs will improve °Outcome: Progressing °  °Problem: Clinical Measurements: °Goal: Cardiovascular complication will be avoided °Outcome: Progressing °  °

## 2020-07-14 ENCOUNTER — Inpatient Hospital Stay (HOSPITAL_COMMUNITY): Payer: Medicare HMO | Admitting: Certified Registered Nurse Anesthetist

## 2020-07-14 ENCOUNTER — Encounter (HOSPITAL_COMMUNITY): Admission: EM | Disposition: E | Payer: Self-pay | Source: Home / Self Care | Attending: Internal Medicine

## 2020-07-14 DIAGNOSIS — T8130XA Disruption of wound, unspecified, initial encounter: Secondary | ICD-10-CM | POA: Diagnosis not present

## 2020-07-14 HISTORY — PX: AMPUTATION: SHX166

## 2020-07-14 LAB — COMPREHENSIVE METABOLIC PANEL
ALT: 28 U/L (ref 0–44)
AST: 36 U/L (ref 15–41)
Albumin: 1.2 g/dL — ABNORMAL LOW (ref 3.5–5.0)
Alkaline Phosphatase: 514 U/L — ABNORMAL HIGH (ref 38–126)
Anion gap: 8 (ref 5–15)
BUN: 50 mg/dL — ABNORMAL HIGH (ref 6–20)
CO2: 17 mmol/L — ABNORMAL LOW (ref 22–32)
Calcium: 8.4 mg/dL — ABNORMAL LOW (ref 8.9–10.3)
Chloride: 119 mmol/L — ABNORMAL HIGH (ref 98–111)
Creatinine, Ser: 2.99 mg/dL — ABNORMAL HIGH (ref 0.44–1.00)
GFR, Estimated: 16 mL/min — ABNORMAL LOW (ref >60)
Glucose, Bld: 43 mg/dL — CL (ref 70–99)
Potassium: 5.8 mmol/L — ABNORMAL HIGH (ref 3.5–5.1)
Sodium: 144 mmol/L (ref 135–145)
Total Bilirubin: 0.5 mg/dL (ref 0.3–1.2)
Total Protein: 5.2 g/dL — ABNORMAL LOW (ref 6.5–8.1)

## 2020-07-14 LAB — GLUCOSE, CAPILLARY
Glucose-Capillary: 58 mg/dL — ABNORMAL LOW (ref 70–99)
Glucose-Capillary: 100 mg/dL — ABNORMAL HIGH (ref 70–99)
Glucose-Capillary: 69 mg/dL — ABNORMAL LOW (ref 70–99)
Glucose-Capillary: 90 mg/dL (ref 70–99)
Glucose-Capillary: 93 mg/dL (ref 70–99)
Glucose-Capillary: 75 mg/dL (ref 70–99)
Glucose-Capillary: 81 mg/dL (ref 70–99)
Glucose-Capillary: 96 mg/dL (ref 70–99)

## 2020-07-14 LAB — CBC
HCT: 36.5 % (ref 36.0–46.0)
Hemoglobin: 11.4 g/dL — ABNORMAL LOW (ref 12.0–15.0)
MCH: 28.9 pg (ref 26.0–34.0)
MCHC: 31.2 g/dL (ref 30.0–36.0)
MCV: 92.6 fL (ref 80.0–100.0)
Platelets: 161 10*3/uL (ref 150–400)
RBC: 3.94 MIL/uL (ref 3.87–5.11)
RDW: 20.1 % — ABNORMAL HIGH (ref 11.5–15.5)
WBC: 9.4 10*3/uL (ref 4.0–10.5)
nRBC: 0 % (ref 0.0–0.2)

## 2020-07-14 LAB — MAGNESIUM: Magnesium: 2.1 mg/dL (ref 1.7–2.4)

## 2020-07-14 SURGERY — AMPUTATION BELOW KNEE
Anesthesia: General | Site: Knee | Laterality: Right

## 2020-07-14 MED ORDER — LIDOCAINE 2% (20 MG/ML) 5 ML SYRINGE
INTRAMUSCULAR | Status: AC
  Filled 2020-07-14: qty 5

## 2020-07-14 MED ORDER — PROPOFOL 10 MG/ML IV BOLUS
INTRAVENOUS | Status: DC | PRN
  Administered 2020-07-14: 50 mg via INTRAVENOUS

## 2020-07-14 MED ORDER — FENTANYL CITRATE (PF) 100 MCG/2ML IJ SOLN: 25.0000 ug | INTRAMUSCULAR | Status: DC | PRN

## 2020-07-14 MED ORDER — ONDANSETRON HCL 4 MG/2ML IJ SOLN
INTRAMUSCULAR | Status: DC | PRN
  Administered 2020-07-14: 4 mg via INTRAVENOUS

## 2020-07-14 MED ORDER — FENTANYL CITRATE (PF) 250 MCG/5ML IJ SOLN
INTRAMUSCULAR | Status: AC
Start: 2020-07-14 — End: ?
  Filled 2020-07-14: qty 5

## 2020-07-14 MED ORDER — ONDANSETRON HCL 4 MG/2ML IJ SOLN
4.0000 mg | Freq: Four times a day (QID) | INTRAMUSCULAR | Status: DC | PRN
  Administered 2020-07-20 – 2020-07-29 (×7): 4 mg via INTRAVENOUS
  Filled 2020-07-14 (×7): qty 2

## 2020-07-14 MED ORDER — PROPOFOL 10 MG/ML IV BOLUS
INTRAVENOUS | Status: AC
  Filled 2020-07-14: qty 20

## 2020-07-14 MED ORDER — SODIUM CHLORIDE 0.9 % IV SOLN: INTRAVENOUS | Status: DC

## 2020-07-14 MED ORDER — VASOPRESSIN 20 UNIT/ML IV SOLN
INTRAVENOUS | Status: AC
  Filled 2020-07-14: qty 1

## 2020-07-14 MED ORDER — ACETAMINOPHEN 500 MG PO TABS: 1000.0000 mg | ORAL_TABLET | Freq: Once | ORAL | Status: DC

## 2020-07-14 MED ORDER — PROMETHAZINE HCL 25 MG/ML IJ SOLN: 6.2500 mg | INTRAMUSCULAR | Status: DC | PRN

## 2020-07-14 MED ORDER — SENNA 8.6 MG PO TABS
1.0000 | ORAL_TABLET | Freq: Two times a day (BID) | ORAL | Status: DC
  Administered 2020-07-14 – 2020-07-22 (×16): 8.6 mg via ORAL
  Filled 2020-07-14 (×16): qty 1

## 2020-07-14 MED ORDER — VANCOMYCIN HCL 500 MG IV SOLR
INTRAVENOUS | Status: DC | PRN
  Administered 2020-07-14: 500 mg

## 2020-07-14 MED ORDER — HYDROMORPHONE HCL 1 MG/ML IJ SOLN
INTRAMUSCULAR | Status: AC
  Administered 2020-07-14: 0.5 mg via INTRAVENOUS
  Filled 2020-07-14: qty 1

## 2020-07-14 MED ORDER — PHENYLEPHRINE HCL-NACL 10-0.9 MG/250ML-% IV SOLN
INTRAVENOUS | Status: DC | PRN
  Administered 2020-07-14: 50 ug/min via INTRAVENOUS

## 2020-07-14 MED ORDER — MIDAZOLAM HCL 5 MG/5ML IJ SOLN
INTRAMUSCULAR | Status: DC | PRN
  Administered 2020-07-14: 1 mg via INTRAVENOUS

## 2020-07-14 MED ORDER — SODIUM CHLORIDE 0.9 % IR SOLN
Status: DC | PRN
  Administered 2020-07-14: 3000 mL

## 2020-07-14 MED ORDER — CHLORHEXIDINE GLUCONATE 0.12 % MT SOLN
OROMUCOSAL | Status: AC
  Administered 2020-07-14: 15 mL
  Filled 2020-07-14: qty 15

## 2020-07-14 MED ORDER — ONDANSETRON HCL 4 MG/2ML IJ SOLN
INTRAMUSCULAR | Status: AC
  Filled 2020-07-14: qty 2

## 2020-07-14 MED ORDER — HYDROMORPHONE HCL 1 MG/ML IJ SOLN
0.2500 mg | INTRAMUSCULAR | Status: DC | PRN
  Administered 2020-07-14 (×2): 0.25 mg via INTRAVENOUS

## 2020-07-14 MED ORDER — ONDANSETRON HCL 4 MG PO TABS
4.0000 mg | ORAL_TABLET | Freq: Four times a day (QID) | ORAL | Status: DC | PRN
  Administered 2020-07-17 – 2020-07-28 (×6): 4 mg via ORAL
  Filled 2020-07-14 (×6): qty 1

## 2020-07-14 MED ORDER — 0.9 % SODIUM CHLORIDE (POUR BTL) OPTIME
TOPICAL | Status: DC | PRN
  Administered 2020-07-14: 1000 mL

## 2020-07-14 MED ORDER — LIDOCAINE 2% (20 MG/ML) 5 ML SYRINGE
INTRAMUSCULAR | Status: DC | PRN
  Administered 2020-07-14: 40 mg via INTRAVENOUS

## 2020-07-14 MED ORDER — VANCOMYCIN HCL 1000 MG IV SOLR
INTRAVENOUS | Status: AC
  Filled 2020-07-14: qty 1000

## 2020-07-14 MED ORDER — MIDAZOLAM HCL 2 MG/2ML IJ SOLN
INTRAMUSCULAR | Status: AC
  Filled 2020-07-14: qty 2

## 2020-07-14 MED ORDER — PHENYLEPHRINE 40 MCG/ML (10ML) SYRINGE FOR IV PUSH (FOR BLOOD PRESSURE SUPPORT)
PREFILLED_SYRINGE | INTRAVENOUS | Status: DC | PRN
  Administered 2020-07-14: 80 ug via INTRAVENOUS

## 2020-07-14 MED ORDER — MEPERIDINE HCL 25 MG/ML IJ SOLN: 6.2500 mg | INTRAMUSCULAR | Status: DC | PRN

## 2020-07-14 MED ORDER — CEFAZOLIN SODIUM-DEXTROSE 2-4 GM/100ML-% IV SOLN
INTRAVENOUS | Status: AC
  Filled 2020-07-14: qty 100

## 2020-07-14 MED ORDER — ACETAMINOPHEN 325 MG PO TABS
325.0000 mg | ORAL_TABLET | Freq: Four times a day (QID) | ORAL | Status: DC | PRN
  Administered 2020-07-15: 325 mg via ORAL
  Administered 2020-07-15 – 2020-07-16 (×3): 650 mg via ORAL
  Filled 2020-07-14 (×2): qty 2
  Filled 2020-07-14: qty 1
  Filled 2020-07-14 (×2): qty 2

## 2020-07-14 MED ORDER — DOCUSATE SODIUM 100 MG PO CAPS
100.0000 mg | ORAL_CAPSULE | Freq: Two times a day (BID) | ORAL | Status: DC
  Administered 2020-07-14 – 2020-07-22 (×16): 100 mg via ORAL
  Filled 2020-07-14 (×16): qty 1

## 2020-07-14 MED ORDER — CEFAZOLIN SODIUM-DEXTROSE 2-4 GM/100ML-% IV SOLN
2.0000 g | INTRAVENOUS | Status: AC
  Administered 2020-07-14: 2 g via INTRAVENOUS

## 2020-07-14 MED ORDER — DIPHENHYDRAMINE HCL 12.5 MG/5ML PO ELIX: 12.5000 mg | ORAL_SOLUTION | ORAL | Status: DC | PRN

## 2020-07-14 MED ORDER — FENTANYL CITRATE (PF) 250 MCG/5ML IJ SOLN
INTRAMUSCULAR | Status: DC | PRN
Start: 2020-07-14 — End: 2020-07-14
  Administered 2020-07-14: 25 ug via INTRAVENOUS

## 2020-07-14 SURGICAL SUPPLY — 55 items
APL PRP STRL LF DISP 70% ISPRP (MISCELLANEOUS) ×1
BANDAGE ESMARK 6X9 LF (GAUZE/BANDAGES/DRESSINGS) IMPLANT
BLADE SAW RECIP 87.9 MT (BLADE) ×2 IMPLANT
BNDG CMPR 9X6 STRL LF SNTH (GAUZE/BANDAGES/DRESSINGS)
BNDG CMPR MED 15X6 ELC VLCR LF (GAUZE/BANDAGES/DRESSINGS) ×1
BNDG COHESIVE 6X5 TAN STRL LF (GAUZE/BANDAGES/DRESSINGS) ×2 IMPLANT
BNDG ELASTIC 6X15 VLCR STRL LF (GAUZE/BANDAGES/DRESSINGS) ×1 IMPLANT
BNDG ELASTIC 6X5.8 VLCR STR LF (GAUZE/BANDAGES/DRESSINGS) IMPLANT
BNDG ESMARK 6X9 LF (GAUZE/BANDAGES/DRESSINGS)
CHLORAPREP W/TINT 26 (MISCELLANEOUS) ×2 IMPLANT
COVER SURGICAL LIGHT HANDLE (MISCELLANEOUS) ×2 IMPLANT
COVER WAND RF STERILE (DRAPES) ×2 IMPLANT
CUFF TOURN SGL QUICK 34 (TOURNIQUET CUFF) ×2
CUFF TOURN SGL QUICK 42 (TOURNIQUET CUFF) IMPLANT
CUFF TRNQT CYL 34X4.125X (TOURNIQUET CUFF) ×1 IMPLANT
DRAPE INCISE IOBAN 66X45 STRL (DRAPES) ×2 IMPLANT
DRAPE U-SHAPE 47X51 STRL (DRAPES) ×4 IMPLANT
DRSG MEPITEL 4X7.2 (GAUZE/BANDAGES/DRESSINGS) ×2 IMPLANT
DRSG PAD ABDOMINAL 8X10 ST (GAUZE/BANDAGES/DRESSINGS) ×5 IMPLANT
ELECT CAUTERY BLADE 6.4 (BLADE) IMPLANT
ELECT REM PT RETURN 9FT ADLT (ELECTROSURGICAL) ×2
ELECTRODE REM PT RTRN 9FT ADLT (ELECTROSURGICAL) ×1 IMPLANT
EVACUATOR 1/8 PVC DRAIN (DRAIN) IMPLANT
GAUZE SPONGE 4X4 12PLY STRL (GAUZE/BANDAGES/DRESSINGS) ×2 IMPLANT
GLOVE BIO SURGEON STRL SZ8 (GLOVE) ×2 IMPLANT
GLOVE BIOGEL PI IND STRL 8 (GLOVE) ×2 IMPLANT
GLOVE BIOGEL PI INDICATOR 8 (GLOVE) ×2
GLOVE ECLIPSE 8.0 STRL XLNG CF (GLOVE) ×4 IMPLANT
GOWN STRL REUS W/ TWL LRG LVL3 (GOWN DISPOSABLE) ×1 IMPLANT
GOWN STRL REUS W/ TWL XL LVL3 (GOWN DISPOSABLE) ×1 IMPLANT
GOWN STRL REUS W/TWL LRG LVL3 (GOWN DISPOSABLE) ×2
GOWN STRL REUS W/TWL XL LVL3 (GOWN DISPOSABLE) ×2
KIT BASIN OR (CUSTOM PROCEDURE TRAY) ×2 IMPLANT
KIT TURNOVER KIT B (KITS) ×2 IMPLANT
MANIFOLD NEPTUNE II (INSTRUMENTS) ×2 IMPLANT
NS IRRIG 1000ML POUR BTL (IV SOLUTION) ×2 IMPLANT
PACK GENERAL/GYN (CUSTOM PROCEDURE TRAY) ×2 IMPLANT
PAD ARMBOARD 7.5X6 YLW CONV (MISCELLANEOUS) ×4 IMPLANT
PAD CAST 4YDX4 CTTN HI CHSV (CAST SUPPLIES) ×1 IMPLANT
PADDING CAST COTTON 4X4 STRL (CAST SUPPLIES) ×2
PADDING CAST COTTON 6X4 STRL (CAST SUPPLIES) ×2 IMPLANT
SPONGE LAP 18X18 RF (DISPOSABLE) IMPLANT
STAPLER VISISTAT 35W (STAPLE) IMPLANT
STOCKINETTE IMPERVIOUS LG (DRAPES) IMPLANT
SUT ETHILON 2 0 FS 18 (SUTURE) ×4 IMPLANT
SUT MNCRL AB 3-0 PS2 18 (SUTURE) ×2 IMPLANT
SUT PDS AB 1 CT  36 (SUTURE) ×4
SUT PDS AB 1 CT 36 (SUTURE) ×2 IMPLANT
SUT SILK 2 0 (SUTURE) ×2
SUT SILK 2-0 18XBRD TIE 12 (SUTURE) ×1 IMPLANT
SWAB COLLECTION DEVICE MRSA (MISCELLANEOUS) IMPLANT
SWAB CULTURE ESWAB REG 1ML (MISCELLANEOUS) IMPLANT
TOWEL GREEN STERILE (TOWEL DISPOSABLE) ×2 IMPLANT
TOWEL GREEN STERILE FF (TOWEL DISPOSABLE) ×2 IMPLANT
WATER STERILE IRR 1000ML POUR (IV SOLUTION) ×2 IMPLANT

## 2020-07-14 NOTE — Plan of Care (Signed)
  Problem: Education: Goal: Knowledge of General Education information will improve Description: Including pain rating scale, medication(s)/side effects and non-pharmacologic comfort measures Outcome: Not Progressing   Problem: Clinical Measurements: Goal: Ability to maintain clinical measurements within normal limits will improve Outcome: Not Progressing Goal: Will remain free from infection Outcome: Not Progressing Goal: Diagnostic test results will improve Outcome: Not Progressing Goal: Respiratory complications will improve Outcome: Not Progressing Goal: Cardiovascular complication will be avoided Outcome: Not Progressing   

## 2020-07-14 NOTE — Anesthesia Procedure Notes (Signed)
Procedure Name: LMA Insertion Performed by: Valda Favia, CRNA Pre-anesthesia Checklist: Patient identified, Emergency Drugs available, Suction available and Patient being monitored Patient Re-evaluated:Patient Re-evaluated prior to induction Oxygen Delivery Method: Circle System Utilized Preoxygenation: Pre-oxygenation with 100% oxygen Induction Type: IV induction LMA: LMA inserted LMA Size: 4.0 Number of attempts: 1 Airway Equipment and Method: Bite block Placement Confirmation: positive ETCO2 Tube secured with: Tape Dental Injury: Teeth and Oropharynx as per pre-operative assessment

## 2020-07-14 NOTE — Progress Notes (Signed)
Charge CRNA called for IV assistance.

## 2020-07-14 NOTE — Transfer of Care (Signed)
Immediate Anesthesia Transfer of Care Note  Patient: Wanda [name]  Procedure(s) Performed: Revision right below knee amputation (Right Knee)  Patient Location: PACU  Anesthesia Type:General  Level of Consciousness: awake, alert  and oriented  Airway & Oxygen Therapy: Patient Spontanous Breathing and Patient connected to face mask oxygen  Post-op Assessment: Report given to RN and Post -op Vital signs reviewed and stable  Post vital signs: Reviewed and stable  Last Vitals:  Vitals Value Taken Time  BP 120/49 07/05/2020 1419  Temp 36.3 C 07/16/2020 1420  Pulse 87 07/06/2020 1424  Resp 19 07/16/2020 1425  SpO2 100 % 07/09/2020 1424  Vitals shown include unvalidated device data.  Last Pain:  Vitals:   07/27/2020 1420  TempSrc:   PainSc: 10-Worst pain ever      Patients Stated Pain Goal: 1 (42/70/62 3762)  Complications: No complications documented.

## 2020-07-14 NOTE — Progress Notes (Signed)
Pt is in surgery to clean her wound, reattempt therapy tomorrow.   07/28/2020 1400  PT Visit Information  Reason Eval/Treat Not Completed Patient at procedure or test/unavailable    Mee Hives, PT MS Acute Rehab Dept. Number: [place]

## 2020-07-14 NOTE — Op Note (Addendum)
07/12/2020 - 07/22/2020  2:10 PM  PATIENT:  Wanda [name]  59 y.o. female  PRE-OPERATIVE DIAGNOSIS:  Dehiscence of surgical wound s/p right below knee amputation  POST-OPERATIVE DIAGNOSIS:  Dehiscence of surgical wound s/p right below knee amputation  Procedure(s):  1.  Irrigation and excisional debridement of right leg wound 15 cm x 3 cm x 2 cm   2.  Intermediate closure of right leg wound 15 cm long with extensive undermining  SURGEON:  [name], MD  ASSISTANT: Mechele Claude, PA-C  ANESTHESIA:   General, regional  EBL:  minimal   TOURNIQUET:  approx 5 min with esmarch bandage at the knee  COMPLICATIONS:  None apparent  DISPOSITION:  Extubated, awake and stable to recovery.  INDICATION FOR PROCEDURE: The patient is a 59 year old female with a history of right below-knee amputation for a chronic diabetic foot ulcer and osteomyelitis about 2 months ago.  She has had dehiscence of the medial third of the wound with necrosis of the subcutaneous tissues.  2 other areas of wound necrosis are noted centrally and laterally.  She has failed nonoperative treatment to date and presents today for irrigation and debridement of the wound versus revision below-knee amputation.  The risks and benefits of the alternative treatment options have been discussed in detail.  The patient wishes to proceed with surgery and specifically understands risks of bleeding, infection, nerve damage, blood clots, need for additional surgery, amputation and death.  PROCEDURE IN DETAIL:  After pre operative consent was obtained, and the correct operative site was identified, the patient was brought to the operating room and placed supine on the OR table.  Anesthesia was administered.  Pre-operative antibiotics were administered.  A surgical timeout was taken.  The right lower extremity was prepped and draped in standard sterile fashion.  The leg was exsanguinated and a 6 inch Esmarch tourniquet wrapped around the  distal thigh.  The medial wound was sharply excised with a scalpel circumferentially.  The incision was then opened to the central area of necrosis where this was sharply excised.  The incision was opened to the lateral area of necrosis and this was excised as well.  The wound was then carefully inspected from the level of the skin down to the subcutaneous tissues to the muscle.  All necrotic tissue was sharply excised with a scalpel.  Specimen of deep tissue was sent off to microbiology for aerobic and anaerobic culture.  Wound was then irrigated copiously.  He was the tourniquet was released, and hemostasis was achieved.  The subcutaneous tissues were sprinkled with 500 mg of vancomycin powder.  The skin incision was then closed with horizontal mattress sutures of 3-0 nylon.  Sterile dressings were applied followed by compression wrap.  The patient was awakened from anesthesia and transported to the recovery room in stable condition.  FOLLOW UP PLAN: Nonweightbearing on the right lower extremity.  IV antibiotics per infectious disease or the primary team based on the deep culture results.  Suture removal in 2 to 4 weeks in the office.    Mechele Claude PA-C was present and scrubbed for the duration of the operative case. His assistance was essential in positioning the patient, prepping and draping, gaining and maintaining exposure, performing the operation, closing and dressing the wounds and applying the splint.

## 2020-07-14 NOTE — Progress Notes (Signed)
Wanda [name]  PIR:[id number] DOB: Oct 14, 1960 DOA: 07/19/2020 PCP: Lorene Dy, MD    Brief Narrative:  [phone] with a history of PAF on Eliquis, CKD stage IV status post renal transplant, anemia of CKD, gastric sleeve, hypoalbuminemia, DM2, HTN, hypothyroidism, GERD, PAD, and chronic neuropathy who was admitted to the hospital 05/01/2020-06/01/2020 in septic shock secondary to a R foot ulcer with Klebsiella bacteremia complicated by endocarditis.  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  She returned to the ED 10/5 with a 1 week history of drainage and dehiscence of the stump site.  In the ED the patient was found to have an elevated WBC.  X-ray right tibia/fibula was without evidence of osseous abnormality.  She was found to be hyperkalemic at 6.0 and in acute renal failure with creatinine 3.4 up from baseline of 2.4.    Significant Events:  7/30 > 8/30 admit right foot cellulitis and Klebsiella bacteremia > R BKA 10/5 admit w/ AKI, hyperkalemia, and wound dehiscence  10/12 to OR for wound revision   Antimicrobials:  none  DVT prophylaxis: Eliquis presently on hold for surgery  Subjective: Afebrile. VSS. Resting comfortably in her room post OR visit. No chest pain nausea vomiting or abdominal pain. Tells me she is very weak in general.  Assessment & Plan:  R BKA stump site dehiscence X-ray without evidence to suggest osteomyelitis - finished a prolonged course of IV meropenem 9/25 -blood cultures without growth this admission - Ortho performed irrigation and excisional debridement of right leg wound w/ intermediate closure 10/12 - wound care per Ortho   Recent COVID diagnosed with Covid 9/10 while at [facility] and quarantined for 14 days - last day of quarantine was 10/1 - CXR notes resolved bilateral lung opacities compared to previous exam -entirely asymptomatic from this regard -no  indication for isolation  Acute kidney injury on CKD stage IV status post renal transplant Baseline creatinine 2.1-2.4 -creatinine 3.4 at presentation -Nephrology reviewed and suggested volume resuscitation and close monitoring -creatinine stable after volume expansion, though not yet fully back to prior baseline - follow in serial fashion   Recent Labs  Lab 07/07/2020 0412 07/10/20 0142 07/11/20 0225 07/12/20 0226 07/13/20 0250  CREATININE 3.08* 2.86* 2.79* 2.74* 2.85*    Hyperkalemia Changed to [facility] 10/9 - increased Na bicarb - appears to be stable presently - increased dose of Lokelma to provide a little more wiggle room  Recent Labs  Lab 07/15/2020 0412 07/10/20 0142 07/11/20 0225 07/12/20 0226 07/13/20 0250  K 5.5* 5.5* 5.8* 5.3* 5.4*    Hypotension Likely simply due to volume depletion - resolved with ongoing volume resuscitation -random serum cortisol not low  Chronic paroxysmal atrial fibrillation Continue home amiodarone and resume Eliquis postop - rate controlled in NSR  DM 2 - controlled but with chronic neuropathy Does not require home medical therapy -hypoglycemia resolved and CBG stable without insulin therapy  Hypothyroidism Reported history of such but is not on thyroid hormone replacement - TSH signif elevated at 23.73 - free T4 actually normal at 0.8 - cont w/o supplementation for now - suggest recheck of TSH in 6-8 weeks   Severe malnutrition in context of chronic illness Nutrition consulted  Code Status: FULL CODE Family Communication:  Status is: Inpatient  Remains inpatient appropriate because:Persistent severe electrolyte disturbances   Dispo: The patient is from: SNF  Anticipated d/c is to: SNF              Anticipated d/c date is: 3 days              Patient currently is not medically stable to d/c.   Consultants:  none  Objective: Blood pressure (!) 129/56, pulse 94, temperature 98 F (36.7 C), temperature source Oral,  resp. rate 20, height 5\' 9"  (1.753 m), weight 50.9 kg, SpO2 100 %.  Intake/Output Summary (Last 24 hours) at 07/17/2020 0744 Last data filed at 07/08/2020 0322 Gross per 24 hour  Intake 200.14 ml  Output 200 ml  Net 0.14 ml   Filed Weights   07/08/20 1130  Weight: 50.9 kg    Examination: General: No acute respiratory distress -alert and pleasant Lungs: Clear to auscultation B  Cardiovascular: RRR Abdomen: Thin, soft, BS positive, no rebound, not distended Extremities: No edema B  CBC: Recent Labs  Lab 07/13/2020 1633 07/11/2020 1633 07/08/20 0013 07/10/2020 0412 07/10/20 0142  WBC 14.1*   < > 15.2* 12.6* 13.7*  NEUTROABS 13.2*  --   --   --   --   HGB 12.4   < > 13.2 10.4* 10.8*  HCT 40.7   < > 42.4 33.0* 34.7*  MCV 93.6   < > 93.8 91.4 91.3  PLT 196   < > 158 185 186   < > = values in this interval not displayed.   Basic Metabolic Panel: Recent Labs  Lab 07/22/2020 1633 07/03/2020 2122 07/08/20 0013 07/08/20 1354 07/27/2020 2505 07/18/2020 3976 07/10/20 0142 07/10/20 0142 07/11/20 0225 07/12/20 0226 07/13/20 0250  NA   < >  --  139   < > 142   < > 141   < > 141 143 144  K   < > 5.9* 5.4*   < > 5.5*   < > 5.5*   < > 5.8* 5.3* 5.4*  CL   < >  --  112*   < > 115*   < > 116*   < > 116* 118* 119*  CO2   < >  --  18*   < > 19*   < > 16*   < > 16* 17* 19*  GLUCOSE   < >  --  68*   < > 80   < > 99   < > 80 76 76  BUN   < >  --  46*   < > 43*   < > 44*   < > 43* 45* 47*  CREATININE   < >  --  3.15*   < > 3.08*   < > 2.86*   < > 2.79* 2.74* 2.85*  CALCIUM   < >  --  7.7*   < > 8.1*   < > 8.0*   < > 8.0* 8.1* 8.1*  MG  --  2.1  --   --  2.1  --  2.1  --   --   --   --   PHOS  --   --  3.6  --   --   --   --   --   --   --   --    < > = values in this interval not displayed.   GFR: Estimated Creatinine Clearance: 17.1 mL/min (A) (by C-G formula based on SCr of 2.85 mg/dL (H)).  Liver Function Tests: Recent Labs  Lab 07/20/2020 1633 07/08/20 0013 07/09/20 7341  07/10/20 0142  AST 62*  --  52* 40  ALT 30  --  34 30  ALKPHOS 568*  --  547* 520*  BILITOT 0.9  --  0.8 0.7  PROT 5.9*  --  5.1* 5.3*  ALBUMIN 1.6* 1.4* 1.3* 1.3*    HbA1C: Hgb A1c MFr Bld  Date/Time Value Ref Range Status  07/24/2020 04:12 AM 4.2 (L) 4.8 - 5.6 % Final    Comment:    (NOTE) Pre diabetes:          5.7%-6.4%  Diabetes:              >6.4%  Glycemic control for   <7.0% adults with diabetes   05/02/2020 01:06 AM 4.1 (L) 4.8 - 5.6 % Final    Comment:    (NOTE) Pre diabetes:          5.7%-6.4%  Diabetes:              >6.4%  Glycemic control for   <7.0% adults with diabetes     CBG: Recent Labs  Lab 07/13/20 0757 07/13/20 1235 07/13/20 1615 07/13/20 1947 08/01/2020 0722  GLUCAP 71 87 88 82 58*    Recent Results (from the past 240 hour(s))  Culture, blood (routine x 2)     Status: None   Collection Time: 07/18/2020  5:00 PM   Specimen: BLOOD  Result Value Ref Range Status   Specimen Description BLOOD LEFT ANTECUBITAL  Final   Special Requests   Final    BOTTLES DRAWN AEROBIC AND ANAEROBIC Blood Culture results may not be optimal due to an inadequate volume of blood received in culture bottles   Culture   Final    NO GROWTH 5 DAYS Performed at [facility], [place] [address] [place], [place] [address]    Report Status 07/12/2020 FINAL  Final  Culture, blood (routine x 2)     Status: None   Collection Time: 07/20/2020  6:06 PM   Specimen: BLOOD  Result Value Ref Range Status   Specimen Description BLOOD LEFT ANTECUBITAL  Final   Special Requests   Final    BOTTLES DRAWN AEROBIC AND ANAEROBIC Blood Culture adequate volume   Culture   Final    NO GROWTH 5 DAYS Performed at [facility], [place] [address] [place], [place] [address]    Report Status 07/12/2020 FINAL  Final  Respiratory Panel by RT PCR (Flu A&B, Covid) - Nasopharyngeal Swab     Status: Abnormal   Collection Time: 07/16/2020  9:00 PM   Specimen: Nasopharyngeal Swab  Result  Value Ref Range Status   SARS Coronavirus 2 by RT PCR POSITIVE (A) NEGATIVE Final    Comment: RESULT CALLED TO, READ BACK BY AND VERIFIED WITH: B ROUNDTREE RN 07/06/2020 2217 JDW (NOTE) SARS-CoV-2 target nucleic acids are DETECTED.  SARS-CoV-2 RNA is generally detectable in upper respiratory specimens  during the acute phase of infection. Positive results are indicative of the presence of the identified virus, but do not rule out bacterial infection or co-infection with other pathogens not detected by the test. Clinical correlation with patient history and other diagnostic information is necessary to determine patient infection status. The expected result is Negative.  Fact Sheet for Patients:  PinkCheek.be  Fact Sheet for Healthcare Providers: GravelBags.it  This test is not yet approved or cleared by the [place] FDA and  has been authorized for detection and/or diagnosis of SARS-CoV-2 by FDA under an Emergency Use  Authorization (EUA).  This EUA will remain in effect (meaning this test can be use d) for the duration of  the COVID-19 declaration under Section 564(b)(1) of the Act, 21 U.S.C. section 360bbb-3(b)(1), unless the authorization is terminated or revoked sooner.      Influenza A by PCR NEGATIVE NEGATIVE Final   Influenza B by PCR NEGATIVE NEGATIVE Final    Comment: (NOTE) The Xpert Xpress SARS-CoV-2/FLU/RSV assay is intended as an aid in  the diagnosis of influenza from Nasopharyngeal swab specimens and  should not be used as a sole basis for treatment. Nasal washings and  aspirates are unacceptable for Xpert Xpress SARS-CoV-2/FLU/RSV  testing.  Fact Sheet for Patients: PinkCheek.be  Fact Sheet for Healthcare Providers: GravelBags.it  This test is not yet approved or cleared by the [place] FDA and  has been authorized for detection and/or  diagnosis of SARS-CoV-2 by  FDA under an Emergency Use Authorization (EUA). This EUA will remain  in effect (meaning this test can be used) for the duration of the  Covid-19 declaration under Section 564(b)(1) of the Act, 21  U.S.C. section 360bbb-3(b)(1), unless the authorization is  terminated or revoked. Performed at [facility], [place] [address] [place], [place] [address]   MRSA PCR Screening     Status: None   Collection Time: 08/01/2020  1:10 PM   Specimen: Nasal Mucosa; Nasopharyngeal  Result Value Ref Range Status   MRSA by PCR NEGATIVE NEGATIVE Final    Comment:        The GeneXpert MRSA Assay (FDA approved for NASAL specimens only), is one component of a comprehensive MRSA colonization surveillance program. It is not intended to diagnose MRSA infection nor to guide or monitor treatment for MRSA infections. Performed at [facility], [place] [address], [place], [place] [address]      Scheduled Meds: . (feeding supplement) PROSource Plus  30 mL Oral TID BM  . amiodarone  200 mg Oral Daily  . ascorbic acid  250 mg Oral BID  . cholecalciferol  1,000 Units Oral Daily  . feeding supplement (ENSURE ENLIVE)  237 mL Oral TID WC  . folic acid  1 mg Oral Daily  . hydrocortisone  25 mg Rectal BID  . mirtazapine  7.5 mg Oral QHS  . multivitamin with minerals  1 tablet Oral Daily  . nortriptyline  25 mg Oral QHS  . pantoprazole  40 mg Oral Daily  . predniSONE  5 mg Oral Daily  . ramelteon  8 mg Oral QHS  . senna-docusate  1 tablet Oral BID  . sodium bicarbonate  650 mg Oral TID  . sodium zirconium cyclosilicate  5 g Oral BID  . tacrolimus  1.5 mg Oral Q12H  . thiamine  100 mg Oral Daily  . zinc sulfate  220 mg Oral Daily   Continuous Infusions: . sodium chloride 10 mL/hr at 07/13/20 1715     LOS: 6 days   Cherene Altes, MD [facility] Office  [phone] Pager - Text Page per Shea Evans  If 7PM-7AM, please contact night-coverage per  Amion 07/04/2020, 7:44 AM

## 2020-07-14 NOTE — Interval H&P Note (Signed)
History and Physical Interval Note:  07/28/2020 1:22 PM  Wanda [name]  has presented today for surgery, with the diagnosis of Dehiscence of surgical wound s/p right below knee amputation.  The various methods of treatment have been discussed with the patient and family. After consideration of risks, benefits and other options for treatment, the patient has consented to  Procedure(s) with comments: Revision right below knee amputation; possible right above knee amputation (Right) - 14min as a surgical intervention.  The patient's history has been reviewed, patient examined, no change in status, stable for surgery.  I have reviewed the patient's chart and labs.  Questions were answered to the patient's satisfaction.    The risks and benefits of the alternative treatment options have been discussed in detail.  The patient wishes to proceed with surgery and specifically understands risks of bleeding, infection, nerve damage, blood clots, need for additional surgery, amputation and death.    [name]

## 2020-07-14 NOTE — Anesthesia Preprocedure Evaluation (Addendum)
Anesthesia Evaluation  Patient identified by MRN, date of birth, ID band Patient awake    Reviewed: Allergy & Precautions, H&P , NPO status , Patient's Chart, lab work & pertinent test results  Airway Mallampati: I  TM Distance: >3 FB Neck ROM: Full    Dental no notable dental hx. (+) Edentulous Upper, Edentulous Lower   Pulmonary sleep apnea ,    Pulmonary exam normal breath sounds clear to auscultation       Cardiovascular hypertension, + Peripheral Vascular Disease   Rhythm:Regular Rate:Normal     Neuro/Psych  Headaches, Depression CVA    GI/Hepatic Neg liver ROS, GERD  Medicated,  Endo/Other  diabetes, Insulin DependentHypothyroidism   Renal/GU Renal disease  negative genitourinary   Musculoskeletal  (+) Arthritis , Osteoarthritis,    Abdominal   Peds  Hematology  (+) Blood dyscrasia, anemia ,   Anesthesia Other Findings   Reproductive/Obstetrics negative OB ROS                            Anesthesia Physical Anesthesia Plan  ASA: III  Anesthesia Plan: General   Post-op Pain Management:    Induction: Intravenous  PONV Risk Score and Plan: 4 or greater and Ondansetron, Dexamethasone and Midazolam  Airway Management Planned: LMA and Oral ETT  Additional Equipment:   Intra-op Plan:   Post-operative Plan: Extubation in OR  Informed Consent: I have reviewed the patients History and Physical, chart, labs and discussed the procedure including the risks, benefits and alternatives for the proposed anesthesia with the patient or authorized representative who has indicated his/her understanding and acceptance.     Dental advisory given  Plan Discussed with: CRNA  Anesthesia Plan Comments:        Anesthesia Quick Evaluation

## 2020-07-15 ENCOUNTER — Encounter (HOSPITAL_COMMUNITY): Payer: Self-pay | Admitting: Orthopedic Surgery

## 2020-07-15 DIAGNOSIS — N179 Acute kidney failure, unspecified: Secondary | ICD-10-CM

## 2020-07-15 DIAGNOSIS — E872 Acidosis: Secondary | ICD-10-CM | POA: Diagnosis not present

## 2020-07-15 DIAGNOSIS — E875 Hyperkalemia: Secondary | ICD-10-CM

## 2020-07-15 DIAGNOSIS — I4891 Unspecified atrial fibrillation: Secondary | ICD-10-CM

## 2020-07-15 LAB — CBC
HCT: 33.3 % — ABNORMAL LOW (ref 36.0–46.0)
Hemoglobin: 10.3 g/dL — ABNORMAL LOW (ref 12.0–15.0)
MCH: 28.1 pg (ref 26.0–34.0)
MCHC: 30.9 g/dL (ref 30.0–36.0)
MCV: 91 fL (ref 80.0–100.0)
Platelets: 146 10*3/uL — ABNORMAL LOW (ref 150–400)
RBC: 3.66 MIL/uL — ABNORMAL LOW (ref 3.87–5.11)
RDW: 20.4 % — ABNORMAL HIGH (ref 11.5–15.5)
WBC: 11.1 10*3/uL — ABNORMAL HIGH (ref 4.0–10.5)
nRBC: 0 % (ref 0.0–0.2)

## 2020-07-15 LAB — GLUCOSE, CAPILLARY
Glucose-Capillary: 57 mg/dL — ABNORMAL LOW (ref 70–99)
Glucose-Capillary: 123 mg/dL — ABNORMAL HIGH (ref 70–99)
Glucose-Capillary: 119 mg/dL — ABNORMAL HIGH (ref 70–99)
Glucose-Capillary: 128 mg/dL — ABNORMAL HIGH (ref 70–99)

## 2020-07-15 LAB — BASIC METABOLIC PANEL
Anion gap: 10 (ref 5–15)
BUN: 52 mg/dL — ABNORMAL HIGH (ref 6–20)
CO2: 15 mmol/L — ABNORMAL LOW (ref 22–32)
Calcium: 8.3 mg/dL — ABNORMAL LOW (ref 8.9–10.3)
Chloride: 120 mmol/L — ABNORMAL HIGH (ref 98–111)
Creatinine, Ser: 3.08 mg/dL — ABNORMAL HIGH (ref 0.44–1.00)
GFR, Estimated: 16 mL/min — ABNORMAL LOW (ref >60)
Glucose, Bld: 73 mg/dL (ref 70–99)
Potassium: 5.3 mmol/L — ABNORMAL HIGH (ref 3.5–5.1)
Sodium: 145 mmol/L (ref 135–145)

## 2020-07-15 MED ORDER — APIXABAN 2.5 MG PO TABS
2.5000 mg | ORAL_TABLET | Freq: Two times a day (BID) | ORAL | Status: DC
  Administered 2020-07-15 – 2020-07-26 (×23): 2.5 mg via ORAL
  Filled 2020-07-15 (×23): qty 1

## 2020-07-15 NOTE — Progress Notes (Signed)
Subjective: 1 Day Post-Op Procedure(s) (LRB): Revision right below knee amputation (Right)  Patient reports pain as mild to moderate.  Denies fever, chills, N/V, CP, SOB.  Reports that she is ready for breakfast.  Admits to flatus.  Objective:   VITALS:  Temp:  [96.8 F (36 C)-98.4 F (36.9 C)] 98.4 F (36.9 C) (10/12 2016) Pulse Rate:  [86-95] 95 (10/12 2016) Resp:  [12-20] 20 (10/12 2016) BP: (76-123)/(38-62) 76/62 (10/12 2016) SpO2:  [100 %] 100 % (10/12 2016)  General: WDWN patient in NAD. Psych:  Appropriate mood and affect. Neuro:  A&O x 3, Moving all extremities, sensation intact to light touch HEENT:  EOMs intact Chest:  Even non-labored respirations Skin:  Dressing C/D/I, no rashes or lesions Extremities: warm/dry, no visible edema, erythema or echymosis.  No lymphadenopathy. Pulses: Femoral 2+ MSK:  ROM: TKE, MMT: able to perform quad set    LABS Recent Labs    07/27/2020 0403 07/15/20 0535  HGB 11.4* 10.3*  WBC 9.4 11.1*  PLT 161 146*   Recent Labs    07/30/2020 0403 07/15/20 0535  NA 144 145  K 5.8* 5.3*  CL 119* 120*  CO2 17* 15*  BUN 50* 52*  CREATININE 2.99* 3.08*  GLUCOSE 43* 73   No results for input(s): LABPT, INR in the last 72 hours.   Assessment/Plan: 1 Day Post-Op Procedure(s) (LRB): Revision right below knee amputation (Right)  NWB R LE Up with therapy Gram stain growing rare gram (+) cocci and (-) rods. ABX per ID or Medicine team Plan for 2 week outpatient post-op visit with Dr. Doran Durand.  Mechele Claude PA-C [facility] Office:  [phone]

## 2020-07-15 NOTE — Anesthesia Postprocedure Evaluation (Signed)
Anesthesia Post Note  Patient: Wanda [name]  Procedure(s) Performed: Revision right below knee amputation (Right Knee)     Patient location during evaluation: Other Anesthesia Type: General Level of consciousness: awake and alert Pain management: pain level controlled Vital Signs Assessment: post-procedure vital signs reviewed and stable Respiratory status: spontaneous breathing, nonlabored ventilation and respiratory function stable Cardiovascular status: blood pressure returned to baseline and stable Postop Assessment: no apparent nausea or vomiting Anesthetic complications: no   No complications documented.  Last Vitals:  Vitals:   07/07/2020 2016 07/15/20 0818  BP: (!) 76/62 (!) 113/53  Pulse: 95 92  Resp: 20 11  Temp: 36.9 C (!) 36.3 C  SpO2: 100% 100%    Last Pain:  Vitals:   07/15/20 0818  TempSrc: Oral  PainSc:                  Shivaan Tierno,W. EDMOND

## 2020-07-15 NOTE — Plan of Care (Signed)
  Problem: Education: Goal: Knowledge of General Education information will improve Description: Including pain rating scale, medication(s)/side effects and non-pharmacologic comfort measures Outcome: Not Progressing   Problem: Health Behavior/Discharge Planning: Goal: Ability to manage health-related needs will improve Outcome: Not Progressing   Problem: Clinical Measurements: Goal: Ability to maintain clinical measurements within normal limits will improve Outcome: Not Progressing Goal: Will remain free from infection Outcome: Not Progressing Goal: Diagnostic test results will improve Outcome: Not Progressing Goal: Respiratory complications will improve Outcome: Not Progressing Goal: Cardiovascular complication will be avoided Outcome: Not Progressing   Problem: Coping: Goal: Level of anxiety will decrease Outcome: Not Progressing   Problem: Elimination: Goal: Will not experience complications related to bowel motility Outcome: Not Progressing Goal: Will not experience complications related to urinary retention Outcome: Not Progressing   

## 2020-07-15 NOTE — Progress Notes (Signed)
Occupational Therapy Treatment Patient Details Name: Wanda [name] MRN: [id number] DOB: 10-25-1960 Today's Date: 07/15/2020    History of present illness Wanda [name] is a 59 y.o. 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  Pt will have ortho intervention for closure during this admission.  Pt with significant PMH of recent COVID infection (off of precautions), chronic PAF, DM2, hypothyroidism, essential HTN, PVD, gout, CKD IV, Kidney transplant R 2013.    OT comments  Pt reports pain at RLE but is pleasant and agreeable to working with therapies. Pt engaged in bed level and EOB activity. She continues to require two person assist for safe completion of bed mobility and up to maxA for static balance EOB. Pt engaging in seated grooming ADL with continued assist for balance. Encouraged pt to have family assist with ROM of all extremities when visiting. Further mobility limited as pt reports seeing "black spots" while seated EOB so returned to supine. Feel SNF recommendation remains most appropriate at this time. Will continue to follow acutely.   Follow Up Recommendations  SNF    Equipment Recommendations  Other (comment);Wheelchair (measurements OT);Wheelchair cushion (measurements OT) (TBD next venue)          Precautions / Restrictions Precautions Precautions: Fall Precaution Comments: R BKA Restrictions Weight Bearing Restrictions: No       Mobility Bed Mobility Overal bed mobility: Needs Assistance Bed Mobility: Rolling;Sidelying to Sit;Sit to Supine Rolling: Mod assist;+2 for safety/equipment Sidelying to sit: +2 for physical assistance;Max assist   Sit to supine: +2 for physical assistance;Max assist   General bed mobility comments: cues for sequencing and for using UE/LE to assist   Transfers                 General transfer comment: unable to attempt, pt reports seeing  "black spots" seated EOB therefore returned to supine     Balance Overall balance assessment: Needs assistance Sitting-balance support: Feet supported;Bilateral upper extremity supported Sitting balance-Leahy Scale: Poor Sitting balance - Comments: intermittently able to maintain balance with close minguard however reliant on LUE support. without UE support pt requiring up to maxA (fluctuates0 for balance EOB                                    ADL either performed or assessed with clinical judgement   ADL Overall ADL's : Needs assistance/impaired Eating/Feeding: Set up;Sitting;Bed level Eating/Feeding Details (indicate cue type and reason): setup to open containers  Grooming: Wash/dry face;Moderate assistance;Sitting Grooming Details (indicate cue type and reason): assist for balance EOB                               General ADL Comments: pt tolerating bed level A/AAROM and seated EOB activity. continues to have limitations given overall weakness and decreased tolerance to sitting upright                        Cognition Arousal/Alertness: Awake/alert Behavior During Therapy: WFL for tasks assessed/performed Overall Cognitive Status:  (not specifically tested, grossly WNL)  Exercises Exercises: Other exercises Other Exercises Other Exercises: AAROM to all extremities with emphasis on maximizing knee/hip flexion at LEs. graded resistance provided when appropriate    Shoulder Instructions       General Comments      Pertinent Vitals/ Pain       Pain Assessment: Faces Faces Pain Scale: Hurts even more Pain Location: generalized and R LE Pain Descriptors / Indicators: Grimacing;Guarding;Sore;Aching ("like a toothache") Pain Intervention(s): Monitored during session;Repositioned;Patient requesting pain meds-RN notified;RN gave pain meds during session  Home Living                                           Prior Functioning/Environment              Frequency  Min 2X/week        Progress Toward Goals  OT Goals(current goals can now be found in the care plan section)  Progress towards OT goals: Progressing toward goals  Acute Rehab OT Goals Patient Stated Goal: to get better OT Goal Formulation: With patient Time For Goal Achievement: 07/25/20 Potential to Achieve Goals: Fair ADL Goals Pt Will Perform Grooming: with min guard assist;sitting Pt Will Perform Upper Body Bathing: with min guard assist;sitting Pt Will Perform Upper Body Dressing: with min guard assist;sitting Pt/caregiver will Perform Home Exercise Program: Increased strength;Increased ROM;Both right and left upper extremity;With written HEP provided;With Supervision Additional ADL Goal #1: Pt will perform bed mobility with modA as precursor to EOB/OOB ADL. Additional ADL Goal #2: Pt will tolerate sitting EOB >5 min at minguard assist level during ADL task.  Plan Discharge plan remains appropriate    Co-evaluation                 AM-PAC OT "6 Clicks" Daily Activity     Outcome Measure   Help from another person eating meals?: A Little Help from another person taking care of personal grooming?: A Little Help from another person toileting, which includes using toliet, bedpan, or urinal?: Total Help from another person bathing (including washing, rinsing, drying)?: A Lot Help from another person to put on and taking off regular upper body clothing?: A Lot Help from another person to put on and taking off regular lower body clothing?: Total 6 Click Score: 12    End of Session    OT Visit Diagnosis: Other abnormalities of gait and mobility (R26.89);Muscle weakness (generalized) (M62.81)   Activity Tolerance Patient tolerated treatment well   Patient Left in bed;with call bell/phone within reach   Nurse Communication Mobility status        Time: 8891-6945 OT  Time Calculation (min): 35 min  Charges: OT General Charges $OT Visit: 1 Visit OT Treatments $Self Care/Home Management : 8-22 mins  Lou Cal, OT Acute Rehabilitation Services Pager [phone] Office [place] 07/15/2020, 1:46 PM

## 2020-07-15 NOTE — Progress Notes (Signed)
Physical Therapy Treatment Patient Details Name: Wanda [name] MRN: [id number] DOB: [birth year] Today's Date: 07/15/2020    History of Present Illness Wanda [name] is a 59 y.o. 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  Pt will have ortho intervention for closure during this admission.  Pt with significant PMH of recent COVID infection (off of precautions), chronic PAF, DM2, hypothyroidism, essential HTN, PVD, gout, CKD IV, Kidney transplant R 2013.     PT Comments    Pt alert and participative today.  Emphasis on there ex with stress on R knee flexion and AAROM to Bil LE's and bil UE's , transition to EOB, sitting balance work.  Limited due to signs of softening BP's.    Follow Up Recommendations  SNF     Equipment Recommendations  Wheelchair (measurements PT);Wheelchair cushion (measurements PT);Hospital bed;Other (comment) (TBD further)    Recommendations for Other Services       Precautions / Restrictions Precautions Precautions: Fall Precaution Comments: R BKA Restrictions Weight Bearing Restrictions: No    Mobility  Bed Mobility Overal bed mobility: Needs Assistance Bed Mobility: Rolling;Sidelying to Sit;Sit to Supine Rolling: Mod assist;+2 for safety/equipment Sidelying to sit: +2 for physical assistance;Max assist   Sit to supine: +2 for physical assistance;Max assist   General bed mobility comments: cues for sequencing and for using UE/LE to assist   Transfers                 General transfer comment: unable to attempt, pt reports seeing "black spots" seated EOB therefore returned to supine   Ambulation/Gait                 Stairs             Wheelchair Mobility    Modified Rankin (Stroke Patients Only)       Balance Overall balance assessment: Needs assistance Sitting-balance support: Feet supported;Bilateral upper extremity  supported Sitting balance-Leahy Scale: Poor Sitting balance - Comments: intermittently able to maintain balance with close minguard however reliant on LUE support. without UE support pt requiring up to maxA (fluctuates) for balance EOB                                     Cognition Arousal/Alertness: Awake/alert Behavior During Therapy: WFL for tasks assessed/performed Overall Cognitive Status:  (not specifically tested, grossly WNL)                                        Exercises Other Exercises Other Exercises: AAROM to all extremities with emphasis on maximizing knee/hip flexion at LEs. graded resistance provided when appropriate     General Comments        Pertinent Vitals/Pain Pain Assessment: Faces Faces Pain Scale: Hurts even more Pain Location: generalized and R LE Pain Descriptors / Indicators: Grimacing;Guarding;Sore;Aching ("like a toothache") Pain Intervention(s): Monitored during session    Home Living                      Prior Function            PT Goals (current goals can now be found in the care plan section) Acute Rehab PT Goals Patient Stated Goal: to get better PT Goal  Formulation: With patient Time For Goal Achievement: 07/25/20 Potential to Achieve Goals: Fair Progress towards PT goals: Progressing toward goals    Frequency    Min 2X/week      PT Plan Current plan remains appropriate    Co-evaluation PT/OT/SLP Co-Evaluation/Treatment: Yes Reason for Co-Treatment: Complexity of the patient's impairments (multi-system involvement) PT goals addressed during session: Mobility/safety with mobility        AM-PAC PT "6 Clicks" Mobility   Outcome Measure  Help needed turning from your back to your side while in a flat bed without using bedrails?: A Lot Help needed moving from lying on your back to sitting on the side of a flat bed without using bedrails?: A Lot Help needed moving to and from a bed  to a chair (including a wheelchair)?: Total Help needed standing up from a chair using your arms (e.g., wheelchair or bedside chair)?: Total Help needed to walk in hospital room?: Total Help needed climbing 3-5 steps with a railing? : Total 6 Click Score: 8    End of Session   Activity Tolerance: Patient tolerated treatment well;Patient limited by fatigue Patient left: in bed;with call bell/phone within reach Nurse Communication: Mobility status PT Visit Diagnosis: Muscle weakness (generalized) (M62.81);Difficulty in walking, not elsewhere classified (R26.2);Pain;Other abnormalities of gait and mobility (R26.89) Pain - Right/Left: Right Pain - part of body: Leg     Time: 1884-1660 PT Time Calculation (min) (ACUTE ONLY): 27 min  Charges:  $Therapeutic Activity: 8-22 mins                     07/15/2020  Ginger Carne., PT Acute Rehabilitation Services [phone]  (pager) [phone]  (office)   Tessie Fass Addalynn Kumari 07/15/2020, 4:03 PM

## 2020-07-15 NOTE — Progress Notes (Signed)
Wanda [name]  BOF:[id number] DOB: [birth year] DOA: 08/01/2020 PCP: Lorene Dy, MD    Brief Narrative:  [phone] with a history of PAF on Eliquis, CKD stage IV status post renal transplant, anemia of CKD, gastric sleeve, hypoalbuminemia, DM2, HTN, hypothyroidism, GERD, PAD, and chronic neuropathy who was admitted to the hospital 05/01/2020-06/01/2020 in septic shock secondary to a R foot ulcer with Klebsiella bacteremia complicated by endocarditis.  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  She returned to the ED 10/5 with a 1 week history of drainage and dehiscence of the stump site.  In the ED the patient was found to have an elevated WBC.  X-ray right tibia/fibula was without evidence of osseous abnormality.  She was found to be hyperkalemic at 6.0 and in acute renal failure with creatinine 3.4 up from baseline of 2.4.    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  Subjective: Patient continues to have some pain in her right lower extremity.  Denies any chest pain nausea vomiting.  No shortness of breath   Assessment & Plan:  R BKA stump site dehiscence X-ray without evidence to suggest osteomyelitis - finished a prolonged course of IV meropenem 9/25 -blood cultures without growth this admission - Ortho performed irrigation and excisional debridement of right leg wound w/ intermediate closure 10/12. She will be nonweightbearing on the right lower extremity. She remains off of antibiotics at this time. Gram stain of the deep tissue cultures sent yesterday show gram-negative rods along with GPC.  Patient's WBC is noted to be elevated.  She is however afebrile.  Wait for final identification and  sensitivities.  May need to discuss with infectious disease regarding further course of antibacterials.  Recent COVID Diagnosed with Covid 9/10 while at [facility] and quarantined for 14 days - last day of quarantine was 10/1 - CXR notes resolved bilateral lung opacities compared to previous exam -entirely asymptomatic from this regard -no indication for isolation  Acute kidney injury on CKD stage IV status post renal transplant/metabolic acidosis Baseline creatinine 2.1-2.4 Creatinine 3.4 at presentation.  It appears that this was discussed with nephrology who suggested volume resuscitation which was provided.  Creatinine noted to be 3.08 this morning.  Potassium 5.3.  Monitor urine output.  Recheck labs tomorrow.  She is followed by Dr. Posey Pronto at [facility].  May need to consult nephrology if renal function continues to worsen. Continue oral sodium bicarbonate. Noted to be on tacrolimus and prednisone.  Hyperkalemia Potassium noted to be 5.3 today from 5.8 yesterday.  She remains on Lokelma.    Hypotension Likely simply due to volume depletion - resolved with ongoing volume resuscitation -random serum cortisol not low  Chronic paroxysmal atrial fibrillation Continue home amiodarone.  Okay to resume Eliquis today per orthopedics.  DM 2 - controlled but with chronic neuropathy Does not require home medical therapy -hypoglycemia resolved and CBG stable without insulin therapy  Hypothyroidism Reported history of such but is not on thyroid hormone replacement - TSH signif elevated at 23.73 - free T4 actually normal at 0.8 - cont w/o supplementation for now - suggest recheck of TSH in 6-8 weeks   Normocytic anemia Likely due  to chronic disease.  No evidence of overt bleeding.  Continue to monitor.  Severe malnutrition in context of chronic illness Nutrition consulted  DVT prophylaxis: Eliquis to be resumed today. Code Status: FULL CODE Family Communication:  Discussed with the patient.  No family at bedside Disposition: Skilled nursing facility  Status is: Inpatient  Remains inpatient appropriate because:Persistent severe electrolyte disturbances   Dispo: The patient is from: SNF              Anticipated d/c is to: SNF              Anticipated d/c date is: 3 days              Patient currently is not medically stable to d/c.   Consultants:  Orthopedics  Objective: Blood pressure (!) 113/53, pulse 92, temperature (!) 97.4 F (36.3 C), temperature source Oral, resp. rate 11, height 5\' 9"  (1.753 m), weight 50.9 kg, SpO2 100 %.  Intake/Output Summary (Last 24 hours) at 07/15/2020 1219 Last data filed at 07/15/2020 0342 Gross per 24 hour  Intake 800 ml  Output 160 ml  Net 640 ml   Filed Weights   07/08/20 1130  Weight: 50.9 kg    Examination:  General appearance: Awake alert.  In no distress Resp: Clear to auscultation bilaterally.  Normal effort Cardio: S1-S2 is normal regular.  No S3-S4.  No rubs murmurs or bruit GI: Abdomen is soft.  Nontender nondistended.  Bowel sounds are present normal.  No masses organomegaly Extremities: Right BKA stump covered in dressing.  Left foot first toe covered in dressing. Neurologic: No focal neurological deficits.    CBC: Recent Labs  Lab 07/10/20 0142 08/02/2020 0403 07/15/20 0535  WBC 13.7* 9.4 11.1*  HGB 10.8* 11.4* 10.3*  HCT 34.7* 36.5 33.3*  MCV 91.3 92.6 91.0  PLT 186 161 458*   Basic Metabolic Panel: Recent Labs  Lab 08/02/2020 0412 07/25/2020 0412 07/10/20 0142 07/11/20 0225 07/13/20 0250 07/28/2020 0403 07/15/20 0535  NA 142   < > 141   < > 144 144 145  K 5.5*   < > 5.5*   < > 5.4* 5.8* 5.3*  CL 115*   < > 116*   < > 119* 119* 120*  CO2 19*   < > 16*   < > 19* 17* 15*  GLUCOSE 80   < > 99   < > 76 43* 73  BUN 43*   < > 44*   < > 47* 50* 52*  CREATININE 3.08*   < > 2.86*   < > 2.85* 2.99* 3.08*  CALCIUM 8.1*   < > 8.0*   < > 8.1* 8.4* 8.3*  MG 2.1  --  2.1  --   --   2.1  --    < > = values in this interval not displayed.   GFR: Estimated Creatinine Clearance: 15.8 mL/min (A) (by C-G formula based on SCr of 3.08 mg/dL (H)).  Liver Function Tests: Recent Labs  Lab 07/17/2020 0412 07/10/20 0142 07/04/2020 0403  AST 52* 40 36  ALT 34 30 28  ALKPHOS 547* 520* 514*  BILITOT 0.8 0.7 0.5  PROT 5.1* 5.3* 5.2*  ALBUMIN 1.3* 1.3* 1.2*    HbA1C: Hgb A1c MFr Bld  Date/Time Value Ref Range Status  07/07/2020 04:12 AM 4.2 (L) 4.8 - 5.6 % Final    Comment:    (NOTE) Pre diabetes:          5.7%-6.4%  Diabetes:              >6.4%  Glycemic control for   <7.0% adults with diabetes   05/02/2020 01:06 AM 4.1 (L) 4.8 - 5.6 % Final    Comment:    (NOTE) Pre diabetes:          5.7%-6.4%  Diabetes:              >6.4%  Glycemic control for   <7.0% adults with diabetes     CBG: Recent Labs  Lab 08/02/2020 1422 07/13/2020 1540 08/01/2020 2019 07/15/20 0816 07/15/20 1152  GLUCAP 75 81 96 57* 123*    Recent Results (from the past 240 hour(s))  Culture, blood (routine x 2)     Status: None   Collection Time: 07/13/2020  5:00 PM   Specimen: BLOOD  Result Value Ref Range Status   Specimen Description BLOOD LEFT ANTECUBITAL  Final   Special Requests   Final    BOTTLES DRAWN AEROBIC AND ANAEROBIC Blood Culture results may not be optimal due to an inadequate volume of blood received in culture bottles   Culture   Final    NO GROWTH 5 DAYS Performed at [facility], [place] [address], [place], [place] [address]    Report Status 07/12/2020 FINAL  Final  Culture, blood (routine x 2)     Status: None   Collection Time: 07/24/2020  6:06 PM   Specimen: BLOOD  Result Value Ref Range Status   Specimen Description BLOOD LEFT ANTECUBITAL  Final   Special Requests   Final    BOTTLES DRAWN AEROBIC AND ANAEROBIC Blood Culture adequate volume   Culture   Final    NO GROWTH 5 DAYS Performed at [facility], [place] [address], [place], [place] [address]     Report Status 07/12/2020 FINAL  Final  Respiratory Panel by RT PCR (Flu A&B, Covid) - Nasopharyngeal Swab     Status: Abnormal   Collection Time: 07/18/2020  9:00 PM   Specimen: Nasopharyngeal Swab  Result Value Ref Range Status   SARS Coronavirus 2 by RT PCR POSITIVE (A) NEGATIVE Final    Comment: RESULT CALLED TO, READ BACK BY AND VERIFIED WITH: B ROUNDTREE RN 07/31/2020 2217 JDW (NOTE) SARS-CoV-2 target nucleic acids are DETECTED.  SARS-CoV-2 RNA is generally detectable in upper respiratory specimens  during the acute phase of infection. Positive results are indicative of the presence of the identified virus, but do not rule out bacterial infection or co-infection with other pathogens not detected by the test. Clinical correlation with patient history and other diagnostic information is necessary to determine patient infection status. The expected result is Negative.  Fact Sheet for Patients:  PinkCheek.be  Fact Sheet for Healthcare Providers: GravelBags.it  This test is not yet approved or cleared by the [place] FDA and  has been authorized for detection and/or diagnosis of SARS-CoV-2 by FDA under an Emergency Use Authorization (EUA).  This EUA will remain in effect (meaning this test can be use d) for the duration of  the COVID-19 declaration under Section 564(b)(1) of the Act, 21 U.S.C. section 360bbb-3(b)(1), unless the authorization is terminated or revoked sooner.      Influenza A by PCR NEGATIVE NEGATIVE Final   Influenza B by PCR NEGATIVE NEGATIVE Final    Comment: (NOTE) The Xpert Xpress SARS-CoV-2/FLU/RSV assay is intended as an aid in  the diagnosis of influenza from Nasopharyngeal swab specimens and  should not be used  as a sole basis for treatment. Nasal washings and  aspirates are unacceptable for Xpert Xpress SARS-CoV-2/FLU/RSV  testing.  Fact Sheet for  Patients: PinkCheek.be  Fact Sheet for Healthcare Providers: GravelBags.it  This test is not yet approved or cleared by the [place] FDA and  has been authorized for detection and/or diagnosis of SARS-CoV-2 by  FDA under an Emergency Use Authorization (EUA). This EUA will remain  in effect (meaning this test can be used) for the duration of the  Covid-19 declaration under Section 564(b)(1) of the Act, 21  U.S.C. section 360bbb-3(b)(1), unless the authorization is  terminated or revoked. Performed at [facility], [place] [address], [place], [place] [address]   MRSA PCR Screening     Status: None   Collection Time: 07/08/2020  1:10 PM   Specimen: Nasal Mucosa; Nasopharyngeal  Result Value Ref Range Status   MRSA by PCR NEGATIVE NEGATIVE Final    Comment:        The GeneXpert MRSA Assay (FDA approved for NASAL specimens only), is one component of a comprehensive MRSA colonization surveillance program. It is not intended to diagnose MRSA infection nor to guide or monitor treatment for MRSA infections. Performed at [facility], [place] [address] [place], [place] [address]   Aerobic/Anaerobic Culture (surgical/deep wound)     Status: None (Preliminary result)   Collection Time: 07/13/2020  1:46 PM   Specimen: PATH Soft tissue  Result Value Ref Range Status   Specimen Description TISSUE RIGHT LEG  Final   Special Requests DEEP SPEC A  Final   Gram Stain   Final    FEW WBC PRESENT,BOTH PMN AND MONONUCLEAR RARE GRAM POSITIVE COCCI RARE GRAM NEGATIVE RODS    Culture   Final    FEW GRAM NEGATIVE RODS CULTURE REINCUBATED FOR BETTER GROWTH SUSCEPTIBILITIES TO FOLLOW Performed at [facility], [place] [address], [place], [place] [address]    Report Status PENDING  Incomplete     Scheduled Meds: . (feeding supplement) PROSource Plus  30 mL Oral TID BM  . amiodarone  200 mg Oral Daily  . ascorbic acid  250  mg Oral BID  . cholecalciferol  1,000 Units Oral Daily  . docusate sodium  100 mg Oral BID  . feeding supplement (ENSURE ENLIVE)  237 mL Oral TID WC  . folic acid  1 mg Oral Daily  . hydrocortisone  25 mg Rectal BID  . mirtazapine  7.5 mg Oral QHS  . multivitamin with minerals  1 tablet Oral Daily  . nortriptyline  25 mg Oral QHS  . pantoprazole  40 mg Oral Daily  . predniSONE  5 mg Oral Daily  . ramelteon  8 mg Oral QHS  . senna  1 tablet Oral BID  . senna-docusate  1 tablet Oral BID  . sodium bicarbonate  650 mg Oral TID  . sodium zirconium cyclosilicate  5 g Oral BID  . tacrolimus  1.5 mg Oral Q12H  . thiamine  100 mg Oral Daily  . zinc sulfate  220 mg Oral Daily   Continuous Infusions: . sodium chloride 10 mL/hr at 07/28/2020 1556     LOS: 7 days   [facility] Office  [phone] Pager - Text Page per Shea Evans  If 7PM-7AM, please contact night-coverage per [place] 07/15/2020, 12:19 PM

## 2020-07-16 ENCOUNTER — Inpatient Hospital Stay (HOSPITAL_COMMUNITY): Payer: Medicare HMO

## 2020-07-16 DIAGNOSIS — E872 Acidosis: Secondary | ICD-10-CM | POA: Diagnosis not present

## 2020-07-16 DIAGNOSIS — A498 Other bacterial infections of unspecified site: Secondary | ICD-10-CM | POA: Diagnosis not present

## 2020-07-16 DIAGNOSIS — I4891 Unspecified atrial fibrillation: Secondary | ICD-10-CM | POA: Diagnosis not present

## 2020-07-16 DIAGNOSIS — T8130XA Disruption of wound, unspecified, initial encounter: Secondary | ICD-10-CM | POA: Diagnosis not present

## 2020-07-16 DIAGNOSIS — J150 Pneumonia due to Klebsiella pneumoniae: Secondary | ICD-10-CM | POA: Diagnosis not present

## 2020-07-16 DIAGNOSIS — I361 Nonrheumatic tricuspid (valve) insufficiency: Secondary | ICD-10-CM | POA: Diagnosis not present

## 2020-07-16 DIAGNOSIS — U071 COVID-19: Secondary | ICD-10-CM

## 2020-07-16 DIAGNOSIS — I959 Hypotension, unspecified: Secondary | ICD-10-CM

## 2020-07-16 DIAGNOSIS — I340 Nonrheumatic mitral (valve) insufficiency: Secondary | ICD-10-CM

## 2020-07-16 DIAGNOSIS — Z8616 Personal history of COVID-19: Secondary | ICD-10-CM

## 2020-07-16 DIAGNOSIS — N179 Acute kidney failure, unspecified: Secondary | ICD-10-CM | POA: Diagnosis not present

## 2020-07-16 DIAGNOSIS — E875 Hyperkalemia: Secondary | ICD-10-CM | POA: Diagnosis not present

## 2020-07-16 LAB — CBC
HCT: 31.5 % — ABNORMAL LOW (ref 36.0–46.0)
Hemoglobin: 9.8 g/dL — ABNORMAL LOW (ref 12.0–15.0)
MCH: 28.3 pg (ref 26.0–34.0)
MCHC: 31.1 g/dL (ref 30.0–36.0)
MCV: 91 fL (ref 80.0–100.0)
Platelets: 134 10*3/uL — ABNORMAL LOW (ref 150–400)
RBC: 3.46 MIL/uL — ABNORMAL LOW (ref 3.87–5.11)
RDW: 20.1 % — ABNORMAL HIGH (ref 11.5–15.5)
WBC: 10.6 10*3/uL — ABNORMAL HIGH (ref 4.0–10.5)
nRBC: 0 % (ref 0.0–0.2)

## 2020-07-16 LAB — GLUCOSE, CAPILLARY
Glucose-Capillary: 73 mg/dL (ref 70–99)
Glucose-Capillary: 79 mg/dL (ref 70–99)
Glucose-Capillary: 85 mg/dL (ref 70–99)
Glucose-Capillary: 78 mg/dL (ref 70–99)

## 2020-07-16 LAB — BASIC METABOLIC PANEL
Anion gap: 9 (ref 5–15)
BUN: 54 mg/dL — ABNORMAL HIGH (ref 6–20)
CO2: 17 mmol/L — ABNORMAL LOW (ref 22–32)
Calcium: 8.2 mg/dL — ABNORMAL LOW (ref 8.9–10.3)
Chloride: 118 mmol/L — ABNORMAL HIGH (ref 98–111)
Creatinine, Ser: 3.02 mg/dL — ABNORMAL HIGH (ref 0.44–1.00)
GFR, Estimated: 16 mL/min — ABNORMAL LOW (ref >60)
Glucose, Bld: 88 mg/dL (ref 70–99)
Potassium: 5.3 mmol/L — ABNORMAL HIGH (ref 3.5–5.1)
Sodium: 144 mmol/L (ref 135–145)

## 2020-07-16 LAB — ECHOCARDIOGRAM COMPLETE
Area-P 1/2: 3.17 cm2
Calc EF: 55.8 %
Height: 69 in
MV M vel: 5.05 m/s
MV Peak grad: 102 mmHg
S' Lateral: 3.3 cm
Single Plane A2C EF: 62.8 %
Single Plane A4C EF: 50.1 %
Weight: 1795.43 oz

## 2020-07-16 MED ORDER — OXYCODONE HCL 5 MG PO TABS
10.0000 mg | ORAL_TABLET | ORAL | Status: DC | PRN
  Administered 2020-07-16 – 2020-07-22 (×15): 10 mg via ORAL
  Filled 2020-07-16 (×16): qty 2

## 2020-07-16 MED ORDER — SODIUM ZIRCONIUM CYCLOSILICATE 10 G PO PACK
10.0000 g | PACK | Freq: Two times a day (BID) | ORAL | Status: DC
  Administered 2020-07-16 – 2020-07-27 (×21): 10 g via ORAL
  Filled 2020-07-16 (×21): qty 1

## 2020-07-16 MED ORDER — SODIUM CHLORIDE 0.9 % IV SOLN
2.0000 g | INTRAVENOUS | Status: DC
  Administered 2020-07-16 – 2020-07-17 (×2): 2 g via INTRAVENOUS
  Filled 2020-07-16 (×3): qty 2

## 2020-07-16 NOTE — Progress Notes (Signed)
Wanda [name]  GEX:[id number] DOB: 09/03/1961 DOA: 07/09/2020 PCP: Lorene Dy, MD    Brief Narrative:  [phone] with a history of PAF on Eliquis, CKD stage IV status post renal transplant, anemia of CKD, gastric sleeve, hypoalbuminemia, DM2, HTN, hypothyroidism, GERD, PAD, and chronic neuropathy who was admitted to the hospital 05/01/2020-06/01/2020 in septic shock secondary to a R foot ulcer with Klebsiella bacteremia complicated by endocarditis.  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  She returned to the ED 10/5 with a 1 week history of drainage and dehiscence of the stump site.  In the ED the patient was found to have an elevated WBC.  X-ray right tibia/fibula was without evidence of osseous abnormality.  She was found to be hyperkalemic at 6.0 and in acute renal failure with creatinine 3.4 up from baseline of 2.4.    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  Subjective: Patient mentions pain involving her right lower extremity.  Denies any shortness of breath.  Has been urinating well.    Assessment & Plan:  R BKA stump site dehiscence X-ray without evidence to suggest osteomyelitis - finished a prolonged course of IV meropenem 9/25 -blood cultures without growth this admission - Ortho performed irrigation and excisional debridement of right leg wound w/ intermediate closure 10/12. She will be nonweightbearing on the right lower extremity. She remains off of antibiotics at this time. Gram stain of the deep tissue cultures show gram-negative rods along with GPC.  Patient had mild leukocytosis however she was afebrile.  Culture report show few Klebsiella in the wound.  Final identification is  still pending.  Will discuss with infectious disease regarding further course of antibacterials.   Recent COVID Diagnosed with Covid 9/10 while at [facility] and quarantined for 14 days - last day of quarantine was 10/1 - CXR notes resolved bilateral lung opacities compared to previous exam -entirely asymptomatic from this regard -no indication for isolation  Acute kidney injury on CKD stage IV status post renal transplant/metabolic acidosis Baseline creatinine 2.1-2.4 Creatinine 3.4 at presentation.  It appears that this was discussed with nephrology who suggested volume resuscitation which was provided.   He has been stable the last 24 to 48 hours.  She has been noted to have adequate urine output.   Potassium stable.  Will increase the dose of Lokelma.  It appears that she takes Veltassa at home. She is followed by Dr. Posey Pronto at [facility].  May need to consult nephrology if renal function continues to worsen. Continue oral sodium bicarbonate.  Bicarbonate level is stable. Check labs tomorrow. Noted to be on tacrolimus and prednisone.  Hyperkalemia Potassium stable at 5.3.  Increase dose of Lokelma.  Hypotension Likely due to volume depletion - resolved with ongoing volume resuscitation -random serum cortisol not low  Chronic paroxysmal atrial fibrillation Continue amiodarone.  Eliquis was resumed yesterday.  DM 2 - controlled but with chronic neuropathy Does not require home medical therapy.  Occasional low glucose levels noted.  Hypothyroidism Reported history of such but is not on thyroid hormone replacement - TSH signif elevated at 23.73 - free T4 actually normal at 0.8 - cont w/o supplementation for now -  suggest recheck of TSH in 6-8 weeks   Normocytic anemia Likely due to chronic disease.  No evidence of overt bleeding.  Continue to monitor.  Severe malnutrition in context of chronic illness Nutrition consulted  DVT prophylaxis: Eliquis to be  resumed today. Code Status: FULL CODE Family Communication: Discussed with the patient.  No family at bedside Disposition: Patient declines SNF.  Wants to go home.  Will need home health.  Status is: Inpatient  Remains inpatient appropriate because:Persistent severe electrolyte disturbances   Dispo: The patient is from: SNF              Anticipated d/c is to: SNF              Anticipated d/c date is: 3 days              Patient currently is not medically stable to d/c.   Consultants:  Orthopedics  Objective: Blood pressure (!) 120/49, pulse 88, temperature 97.7 F (36.5 C), resp. rate 19, height 5\' 9"  (1.753 m), weight 50.9 kg, SpO2 100 %.  Intake/Output Summary (Last 24 hours) at 07/16/2020 1212 Last data filed at 07/16/2020 0327 Gross per 24 hour  Intake 0 ml  Output 50 ml  Net -50 ml   Filed Weights   07/08/20 1130  Weight: 50.9 kg    Examination:  General appearance: Awake alert.  In no distress Resp: Clear to auscultation bilaterally.  Normal effort Cardio: S1-S2 is normal regular.  No S3-S4.  No rubs murmurs or bruit GI: Abdomen is soft.  Nontender nondistended.  Bowel sounds are present normal.  No masses organomegaly Extremities: Right BKA stump covered in dressing.  Able to flex her knee without difficulty. Neurologic: Alert and oriented x3.  No focal neurological deficits.     CBC: Recent Labs  Lab 07/07/2020 0403 07/15/20 0535 07/16/20 0334  WBC 9.4 11.1* 10.6*  HGB 11.4* 10.3* 9.8*  HCT 36.5 33.3* 31.5*  MCV 92.6 91.0 91.0  PLT 161 146* 784*   Basic Metabolic Panel: Recent Labs  Lab 07/10/20 0142 07/11/20 0225 07/30/2020 0403 07/15/20 0535 07/16/20 0334  NA 141   < > 144 145 144  K 5.5*   < > 5.8* 5.3* 5.3*  CL 116*   < > 119* 120* 118*  CO2 16*   < > 17* 15* 17*  GLUCOSE 99   < > 43* 73 88  BUN 44*   < > 50* 52* 54*  CREATININE 2.86*   < > 2.99* 3.08* 3.02*  CALCIUM 8.0*   < > 8.4* 8.3* 8.2*  MG 2.1  --  2.1  --   --    < > = values  in this interval not displayed.   GFR: Estimated Creatinine Clearance: 16.1 mL/min (A) (by C-G formula based on SCr of 3.02 mg/dL (H)).  Liver Function Tests: Recent Labs  Lab 07/10/20 0142 07/04/2020 0403  AST 40 36  ALT 30 28  ALKPHOS 520* 514*  BILITOT 0.7 0.5  PROT 5.3* 5.2*  ALBUMIN 1.3* 1.2*    HbA1C: Hgb A1c MFr Bld  Date/Time Value Ref Range Status  07/05/2020 04:12 AM 4.2 (L) 4.8 - 5.6 % Final    Comment:    (NOTE) Pre diabetes:          5.7%-6.4%  Diabetes:              >6.4%  Glycemic control for   <7.0% adults with diabetes   05/02/2020 01:06 AM 4.1 (  L) 4.8 - 5.6 % Final    Comment:    (NOTE) Pre diabetes:          5.7%-6.4%  Diabetes:              >6.4%  Glycemic control for   <7.0% adults with diabetes     CBG: Recent Labs  Lab 07/15/20 0816 07/15/20 1152 07/15/20 1608 07/15/20 2007 07/16/20 0707  GLUCAP 57* 123* 119* 128* 73    Recent Results (from the past 240 hour(s))  Culture, blood (routine x 2)     Status: None   Collection Time: 07/23/2020  5:00 PM   Specimen: BLOOD  Result Value Ref Range Status   Specimen Description BLOOD LEFT ANTECUBITAL  Final   Special Requests   Final    BOTTLES DRAWN AEROBIC AND ANAEROBIC Blood Culture results may not be optimal due to an inadequate volume of blood received in culture bottles   Culture   Final    NO GROWTH 5 DAYS Performed at [facility], [place] [address], [place], [place] [address]    Report Status 07/12/2020 FINAL  Final  Culture, blood (routine x 2)     Status: None   Collection Time: 07/24/2020  6:06 PM   Specimen: BLOOD  Result Value Ref Range Status   Specimen Description BLOOD LEFT ANTECUBITAL  Final   Special Requests   Final    BOTTLES DRAWN AEROBIC AND ANAEROBIC Blood Culture adequate volume   Culture   Final    NO GROWTH 5 DAYS Performed at [facility], [place] [address], [place], [place] [address]    Report Status 07/12/2020 FINAL  Final  Respiratory Panel by RT  PCR (Flu A&B, Covid) - Nasopharyngeal Swab     Status: Abnormal   Collection Time: 08/01/2020  9:00 PM   Specimen: Nasopharyngeal Swab  Result Value Ref Range Status   SARS Coronavirus 2 by RT PCR POSITIVE (A) NEGATIVE Final    Comment: RESULT CALLED TO, READ BACK BY AND VERIFIED WITH: B ROUNDTREE RN 07/28/2020 2217 JDW (NOTE) SARS-CoV-2 target nucleic acids are DETECTED.  SARS-CoV-2 RNA is generally detectable in upper respiratory specimens  during the acute phase of infection. Positive results are indicative of the presence of the identified virus, but do not rule out bacterial infection or co-infection with other pathogens not detected by the test. Clinical correlation with patient history and other diagnostic information is necessary to determine patient infection status. The expected result is Negative.  Fact Sheet for Patients:  PinkCheek.be  Fact Sheet for Healthcare Providers: GravelBags.it  This test is not yet approved or cleared by the [place] FDA and  has been authorized for detection and/or diagnosis of SARS-CoV-2 by FDA under an Emergency Use Authorization (EUA).  This EUA will remain in effect (meaning this test can be use d) for the duration of  the COVID-19 declaration under Section 564(b)(1) of the Act, 21 U.S.C. section 360bbb-3(b)(1), unless the authorization is terminated or revoked sooner.      Influenza A by PCR NEGATIVE NEGATIVE Final   Influenza B by PCR NEGATIVE NEGATIVE Final    Comment: (NOTE) The Xpert Xpress SARS-CoV-2/FLU/RSV assay is intended as an aid in  the diagnosis of influenza from Nasopharyngeal swab specimens and  should not be used as a sole basis for treatment. Nasal washings and  aspirates are unacceptable for Xpert Xpress SARS-CoV-2/FLU/RSV  testing.  Fact Sheet for Patients: PinkCheek.be  Fact Sheet for Healthcare  Providers: GravelBags.it  This test is not yet approved or cleared by the [place] and  has been authorized for detection and/or diagnosis of SARS-CoV-2 by  FDA under an Emergency Use Authorization (EUA). This EUA will remain  in effect (meaning this test can be used) for the duration of the  Covid-19 declaration under Section 564(b)(1) of the Act, 21  U.S.C. section 360bbb-3(b)(1), unless the authorization is  terminated or revoked. Performed at [facility], [place] [address], [place], [place] [address]   MRSA PCR Screening     Status: None   Collection Time: 07/24/2020  1:10 PM   Specimen: Nasal Mucosa; Nasopharyngeal  Result Value Ref Range Status   MRSA by PCR NEGATIVE NEGATIVE Final    Comment:        The GeneXpert MRSA Assay (FDA approved for NASAL specimens only), is one component of a comprehensive MRSA colonization surveillance program. It is not intended to diagnose MRSA infection nor to guide or monitor treatment for MRSA infections. Performed at [facility], [place] [address], [place], [place] [address]   Aerobic/Anaerobic Culture (surgical/deep wound)     Status: None (Preliminary result)   Collection Time: 07/07/2020  1:46 PM   Specimen: PATH Soft tissue  Result Value Ref Range Status   Specimen Description TISSUE RIGHT LEG  Final   Special Requests DEEP SPEC A  Final   Gram Stain   Final    FEW WBC PRESENT,BOTH PMN AND MONONUCLEAR RARE GRAM POSITIVE COCCI RARE GRAM NEGATIVE RODS    Culture   Final    FEW KLEBSIELLA PNEUMONIAE FEW GRAM NEGATIVE RODS IDENTIFICATION AND SUSCEPTIBILITIES TO FOLLOW Performed at [facility], [place] [address], [place], [place] [address]    Report Status PENDING  Incomplete   Organism ID, Bacteria KLEBSIELLA PNEUMONIAE  Final      Susceptibility   Klebsiella pneumoniae - MIC*    AMPICILLIN RESISTANT Resistant     CEFAZOLIN <=4 SENSITIVE Sensitive     CEFEPIME <=0.12 SENSITIVE  Sensitive     CEFTAZIDIME <=1 SENSITIVE Sensitive     CEFTRIAXONE <=0.25 SENSITIVE Sensitive     CIPROFLOXACIN <=0.25 SENSITIVE Sensitive     GENTAMICIN <=1 SENSITIVE Sensitive     IMIPENEM 0.5 SENSITIVE Sensitive     TRIMETH/SULFA <=20 SENSITIVE Sensitive     AMPICILLIN/SULBACTAM <=2 SENSITIVE Sensitive     PIP/TAZO <=4 SENSITIVE Sensitive     * FEW KLEBSIELLA PNEUMONIAE     Scheduled Meds: . (feeding supplement) PROSource Plus  30 mL Oral TID BM  . amiodarone  200 mg Oral Daily  . apixaban  2.5 mg Oral BID  . ascorbic acid  250 mg Oral BID  . cholecalciferol  1,000 Units Oral Daily  . docusate sodium  100 mg Oral BID  . feeding supplement  237 mL Oral TID WC  . folic acid  1 mg Oral Daily  . hydrocortisone  25 mg Rectal BID  . mirtazapine  7.5 mg Oral QHS  . multivitamin with minerals  1 tablet Oral Daily  . nortriptyline  25 mg Oral QHS  . pantoprazole  40 mg Oral Daily  . predniSONE  5 mg Oral Daily  . ramelteon  8 mg Oral QHS  . senna  1 tablet Oral BID  . senna-docusate  1 tablet Oral BID  . sodium bicarbonate  650 mg Oral TID  . sodium zirconium cyclosilicate  10 g Oral BID  . tacrolimus  1.5  mg Oral Q12H  . thiamine  100 mg Oral Daily  . zinc sulfate  220 mg Oral Daily   Continuous Infusions: . sodium chloride 10 mL/hr at 07/15/20 1400     LOS: 8 days   [facility] Office  [phone] Pager - Text Page per Shea Evans  If 7PM-7AM, please contact night-coverage per Amion 07/16/2020, 12:12 PM

## 2020-07-16 NOTE — Progress Notes (Signed)
  Echocardiogram 2D Echocardiogram has been performed.  Wanda [name] 07/16/2020, 3:26 PM

## 2020-07-16 NOTE — Consult Note (Signed)
[facility]    Date of Admission:  07/22/2020      Total days of antibiotics 0  Day 1 cefepime               Reason for Consult: Wound infection     Referring Provider: Nicki [name]  Primary Care Provider: Lorene Dy, MD   Assessment: Wanda [name] is a 59 y.o. 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 Will start cefepime for treatment while pseudomonas sensitivities are pending. Given soft tissue involvement likely will be OK to consider renally dosed flouroquinolone for treatment course vs IV again if pseudomonas isolate allows.   H/O possible endocarditis, Will repeat TTE while she is here to see if any changes on MV still seen. Denies any fevers/chills prior to admission.   Sudden dizziness with unclear origin. BP normotensive. HR not significantly elevated. Would check CBG and get her some food/fluids. No h/o vertigo. Possible orthostatic with poor intake recently vs hypoglycemia. Will see if these events continue.   Recent h/o COVID infecion 9/10 - unclear if she has been vaccinated. Will discuss with her further about ability to start 3-dose mRNA vaccine series given immunocompromised state to boost immunity following natural infection.     Plan: 1. Start renally dosed cefepime IV  2. Follow micro for sensitivities  3. TTE while she is here to follow up on endocarditis  4. Will talk with her more about covid vaccines     Principal Problem:   Wound dehiscence Active Problems:   Klebsiella pneumoniae infection   Pseudomonas infection   Atrial fibrillation (HCC)   AKI (acute kidney injury) (HCC)   Hyperkalemia   Metabolic acidosis   History of COVID-19   . (feeding supplement) PROSource Plus  30 mL Oral  TID BM  . amiodarone  200 mg Oral Daily  . apixaban  2.5 mg Oral BID  . ascorbic acid  250 mg Oral BID  . cholecalciferol  1,000 Units Oral Daily  . docusate sodium  100 mg Oral BID  . feeding supplement  237 mL Oral TID WC  . folic acid  1 mg Oral Daily  . hydrocortisone  25 mg Rectal BID  . mirtazapine  7.5 mg Oral QHS  . multivitamin with minerals  1 tablet Oral Daily  . nortriptyline  25 mg Oral QHS  . pantoprazole  40 mg Oral Daily  . predniSONE  5 mg Oral Daily  . ramelteon  8 mg Oral QHS  . senna  1 tablet Oral BID  . senna-docusate  1 tablet Oral BID  . sodium bicarbonate  650 mg Oral TID  . sodium zirconium cyclosilicate  10 g Oral BID  . tacrolimus  1.5 mg Oral Q12H  . thiamine  100 mg Oral Daily  . zinc sulfate  220 mg Oral Daily    HPI: Wanda [name] is a 59 y.o. female readmitted to the hospital from Home to evaluate wound dehiscence from previous R BKA site.   PMHx: paroxysmal A. Fib (on Eliquis), CKD stage IV s/p renal transplant on tacrolimus and prednisone, anemia of chronic disease, history of gastric sleeve, hypoalbuminemia, type 2 diabetes, neuropathy, hypertension, hypothyroidism, GERD, PAD.  She was seen by myself and Dr. Linus [name] during hospitalization 05/01/2020-06/01/2020  for sepsis secondary to right foot ulcer/cellulitis s/p amputation 05/07/2020. Complicated by Klebsiella aerogenes (previously enterobacter aerogenes) bacteremia 10-days after amputation of the site and suspicious TTE for endocarditis of the mitral valve.   She tells me today that she started noticing wound dehiscence prior to discontinuing meropenem (which she did not inform me of with our telephone visit 9/20). Presented to ER on 10/5 with drainage and worsening dehiscence. She reported also having a poor appetite but not other findings. Was scheduled initially as outpatient to debride and close leg but was admitted with AKI (SCr 3.4) and malnutrition/dehydration. Plain films w/o any concern for  osteo of the bone. Taken for revision on 10/12 and appears the bone was not impacted.  She also states she never actually had a rash on meropenem only itching.   Upon walking in her room she was experiencing some dizziness after rolling from side to side and sitting up right. BP normal. She has not eaten today yet.   Also had COVID19 9/10 while resident at [facility] center - unclear if she has been vaccinated.     Review of Systems: Review of Systems  Constitutional: Positive for malaise/fatigue and weight loss. Negative for chills and fever.  Respiratory: Negative for cough, sputum production and shortness of breath.   Cardiovascular: Negative for chest pain and leg swelling.  Gastrointestinal: Negative for abdominal pain, diarrhea, nausea and vomiting.  Genitourinary: Negative for dysuria.  Musculoskeletal: Negative for joint pain and myalgias.  Skin: Negative for itching and rash.  Neurological: Positive for dizziness.    Past Medical History:  Diagnosis Date  . Anemia   . Arthritis    "hands" (07/24/2013) pt denies  . Chronic kidney disease, stage IV (severe) ([place])    "never went on dialysis" (07/24/2013)  . Diabetes mellitus type II   . Diabetic peripheral neuropathy ([place])   . Dialysis patient [facility])    Monday Wednesday Friday   . Dyspnea on exertion    pt denies  . Foot pain   . GERD (gastroesophageal reflux disease)   . Gout   . History of chicken pox   . History of measles   . History of mumps   . Hyperlipidemia   . Hypothyroidism   . Migraines    "q 3 months" (07/24/2013)  . Nonspecific abnormal unspecified cardiovascular function study   . Obesity   . PAD (peripheral artery disease) ([place])   . Peripheral neuropathy   . Peripheral vascular disease ([place])   . Pneumonia 09/2012  . Renal transplant disorder   . Shingles   . Sleep apnea    "getting ready to get tested again cause dr says I have it" (07/24/2013) pt states was tested and was told she  did not have sleep apnea / no cpap  . Unspecified essential hypertension     Social History   Tobacco Use  . Smoking status: Never Smoker  . Smokeless tobacco: Never Used  Vaping Use  . Vaping Use: Never used  Substance Use Topics  . Alcohol use: No  . Drug use: No    Family History  Problem Relation Age of Onset  . Colon cancer Father        deceased at age 73  . Diabetes Brother   . Neuropathy Neg Hx    Allergies  Allergen Reactions  . Propoxyphene N-Acetaminophen Other (See Comments)    Severe headache  . Hydrocodone-Acetaminophen Other (See Comments)     Severe headache   .  Astemizole Rash and Other (See Comments)    headache   . Cephalosporins Rash    OBJECTIVE: Blood pressure (!) 132/45, pulse 89, temperature 97.7 F (36.5 C), resp. rate 18, height 5\' 9"  (1.753 m), weight 50.9 kg, SpO2 99 %.  Physical Exam Constitutional:      Comments: Sitting upright in bed. Stating she is dizzy. Sipping on tea   HENT:     Mouth/Throat:     Mouth: Mucous membranes are moist.     Pharynx: Oropharynx is clear.  Eyes:     General: No scleral icterus.    Pupils: Pupils are equal, round, and reactive to light.  Cardiovascular:     Rate and Rhythm: Normal rate and regular rhythm.     Heart sounds: Murmur heard.   Pulmonary:     Effort: Pulmonary effort is normal.     Breath sounds: Normal breath sounds.  Abdominal:     General: There is no distension.     Palpations: Abdomen is soft.     Tenderness: There is no abdominal tenderness.  Musculoskeletal:     Comments: R BKA wrapped in clean ACE dressing L Heel with small eschar noted and dry flaking skin. No erythema, fluctuance or tenderness.  L great toe with skin tears and clean wound bed.   Skin:    General: Skin is warm.     Capillary Refill: Capillary refill takes less than 2 seconds.  Neurological:     Mental Status: She is alert and oriented to person, place, and time.     Lab Results Lab Results    Component Value Date   WBC 10.6 (H) 07/16/2020   HGB 9.8 (L) 07/16/2020   HCT 31.5 (L) 07/16/2020   MCV 91.0 07/16/2020   PLT 134 (L) 07/16/2020    Lab Results  Component Value Date   CREATININE 3.02 (H) 07/16/2020   BUN 54 (H) 07/16/2020   NA 144 07/16/2020   K 5.3 (H) 07/16/2020   CL 118 (H) 07/16/2020   CO2 17 (L) 07/16/2020    Lab Results  Component Value Date   ALT 28 07/21/2020   AST 36 07/31/2020   ALKPHOS 514 (H) 07/11/2020   BILITOT 0.5 07/10/2020     Microbiology: Recent Results (from the past 240 hour(s))  Culture, blood (routine x 2)     Status: None   Collection Time: 07/17/2020  5:00 PM   Specimen: BLOOD  Result Value Ref Range Status   Specimen Description BLOOD LEFT ANTECUBITAL  Final   Special Requests   Final    BOTTLES DRAWN AEROBIC AND ANAEROBIC Blood Culture results may not be optimal due to an inadequate volume of blood received in culture bottles   Culture   Final    NO GROWTH 5 DAYS Performed at [facility], [place] [address] [place], [place] [address]    Report Status 07/12/2020 FINAL  Final  Culture, blood (routine x 2)     Status: None   Collection Time: 07/14/2020  6:06 PM   Specimen: BLOOD  Result Value Ref Range Status   Specimen Description BLOOD LEFT ANTECUBITAL  Final   Special Requests   Final    BOTTLES DRAWN AEROBIC AND ANAEROBIC Blood Culture adequate volume   Culture   Final    NO GROWTH 5 DAYS Performed at [facility], [place] [address] [place], [place] [address]    Report Status 07/12/2020 FINAL  Final  Respiratory Panel by RT PCR (  Flu A&B, Covid) - Nasopharyngeal Swab     Status: Abnormal   Collection Time: 07/05/2020  9:00 PM   Specimen: Nasopharyngeal Swab  Result Value Ref Range Status   SARS Coronavirus 2 by RT PCR POSITIVE (A) NEGATIVE Final    Comment: RESULT CALLED TO, READ BACK BY AND VERIFIED WITH: B ROUNDTREE RN 07/25/2020 2217 JDW (NOTE) SARS-CoV-2 target nucleic acids are DETECTED.  SARS-CoV-2 RNA is  generally detectable in upper respiratory specimens  during the acute phase of infection. Positive results are indicative of the presence of the identified virus, but do not rule out bacterial infection or co-infection with other pathogens not detected by the test. Clinical correlation with patient history and other diagnostic information is necessary to determine patient infection status. The expected result is Negative.  Fact Sheet for Patients:  PinkCheek.be  Fact Sheet for Healthcare Providers: GravelBags.it  This test is not yet approved or cleared by the [place] FDA and  has been authorized for detection and/or diagnosis of SARS-CoV-2 by FDA under an Emergency Use Authorization (EUA).  This EUA will remain in effect (meaning this test can be use d) for the duration of  the COVID-19 declaration under Section 564(b)(1) of the Act, 21 U.S.C. section 360bbb-3(b)(1), unless the authorization is terminated or revoked sooner.      Influenza A by PCR NEGATIVE NEGATIVE Final   Influenza B by PCR NEGATIVE NEGATIVE Final    Comment: (NOTE) The Xpert Xpress SARS-CoV-2/FLU/RSV assay is intended as an aid in  the diagnosis of influenza from Nasopharyngeal swab specimens and  should not be used as a sole basis for treatment. Nasal washings and  aspirates are unacceptable for Xpert Xpress SARS-CoV-2/FLU/RSV  testing.  Fact Sheet for Patients: PinkCheek.be  Fact Sheet for Healthcare Providers: GravelBags.it  This test is not yet approved or cleared by the [place] FDA and  has been authorized for detection and/or diagnosis of SARS-CoV-2 by  FDA under an Emergency Use Authorization (EUA). This EUA will remain  in effect (meaning this test can be used) for the duration of the  Covid-19 declaration under Section 564(b)(1) of the Act, 21  U.S.C. section  360bbb-3(b)(1), unless the authorization is  terminated or revoked. Performed at [facility], [place] [address] [place], [place] [address]   MRSA PCR Screening     Status: None   Collection Time: 07/06/2020  1:10 PM   Specimen: Nasal Mucosa; Nasopharyngeal  Result Value Ref Range Status   MRSA by PCR NEGATIVE NEGATIVE Final    Comment:        The GeneXpert MRSA Assay (FDA approved for NASAL specimens only), is one component of a comprehensive MRSA colonization surveillance program. It is not intended to diagnose MRSA infection nor to guide or monitor treatment for MRSA infections. Performed at [facility], [place] [address], [place], [place] [address]   Aerobic/Anaerobic Culture (surgical/deep wound)     Status: None (Preliminary result)   Collection Time: 07/19/2020  1:46 PM   Specimen: PATH Soft tissue  Result Value Ref Range Status   Specimen Description TISSUE RIGHT LEG  Final   Special Requests DEEP SPEC A  Final   Gram Stain   Final    FEW WBC PRESENT,BOTH PMN AND MONONUCLEAR RARE GRAM POSITIVE COCCI RARE GRAM NEGATIVE RODS Performed at [facility], [place] [address], [place],  [address]    Culture   Final    FEW KLEBSIELLA PNEUMONIAE FEW GRAM NEGATIVE  RODS FEW PSEUDOMONAS AERUGINOSA    Report Status PENDING  Incomplete   Organism ID, Bacteria KLEBSIELLA PNEUMONIAE  Final      Susceptibility   Klebsiella pneumoniae - MIC*    AMPICILLIN RESISTANT Resistant     CEFAZOLIN <=4 SENSITIVE Sensitive     CEFEPIME <=0.12 SENSITIVE Sensitive     CEFTAZIDIME <=1 SENSITIVE Sensitive     CEFTRIAXONE <=0.25 SENSITIVE Sensitive     CIPROFLOXACIN <=0.25 SENSITIVE Sensitive     GENTAMICIN <=1 SENSITIVE Sensitive     IMIPENEM 0.5 SENSITIVE Sensitive     TRIMETH/SULFA <=20 SENSITIVE Sensitive     AMPICILLIN/SULBACTAM <=2 SENSITIVE Sensitive     PIP/TAZO <=4 SENSITIVE Sensitive     * FEW KLEBSIELLA PNEUMONIAE     Janene Madeira, MSN, NP-C [facility] [email or web address] Pager: [phone] Office: [phone] [place]: [phone]

## 2020-07-16 NOTE — Progress Notes (Signed)
Patient assessed.  Reviewed LPN documentation, and agree with the findings.  

## 2020-07-17 DIAGNOSIS — E875 Hyperkalemia: Secondary | ICD-10-CM | POA: Diagnosis not present

## 2020-07-17 DIAGNOSIS — J150 Pneumonia due to Klebsiella pneumoniae: Secondary | ICD-10-CM | POA: Diagnosis not present

## 2020-07-17 DIAGNOSIS — I058 Other rheumatic mitral valve diseases: Secondary | ICD-10-CM

## 2020-07-17 DIAGNOSIS — E872 Acidosis: Secondary | ICD-10-CM | POA: Diagnosis not present

## 2020-07-17 DIAGNOSIS — N179 Acute kidney failure, unspecified: Secondary | ICD-10-CM | POA: Diagnosis not present

## 2020-07-17 DIAGNOSIS — A498 Other bacterial infections of unspecified site: Secondary | ICD-10-CM | POA: Diagnosis not present

## 2020-07-17 DIAGNOSIS — T8130XA Disruption of wound, unspecified, initial encounter: Secondary | ICD-10-CM | POA: Diagnosis not present

## 2020-07-17 DIAGNOSIS — I4891 Unspecified atrial fibrillation: Secondary | ICD-10-CM | POA: Diagnosis not present

## 2020-07-17 LAB — CBC
HCT: 33.3 % — ABNORMAL LOW (ref 36.0–46.0)
Hemoglobin: 10.5 g/dL — ABNORMAL LOW (ref 12.0–15.0)
MCH: 28.5 pg (ref 26.0–34.0)
MCHC: 31.5 g/dL (ref 30.0–36.0)
MCV: 90.5 fL (ref 80.0–100.0)
Platelets: 146 10*3/uL — ABNORMAL LOW (ref 150–400)
RBC: 3.68 MIL/uL — ABNORMAL LOW (ref 3.87–5.11)
RDW: 20.5 % — ABNORMAL HIGH (ref 11.5–15.5)
WBC: 12.4 10*3/uL — ABNORMAL HIGH (ref 4.0–10.5)
nRBC: 0 % (ref 0.0–0.2)

## 2020-07-17 LAB — GLUCOSE, CAPILLARY
Glucose-Capillary: 66 mg/dL — ABNORMAL LOW (ref 70–99)
Glucose-Capillary: 88 mg/dL (ref 70–99)
Glucose-Capillary: 113 mg/dL — ABNORMAL HIGH (ref 70–99)
Glucose-Capillary: 114 mg/dL — ABNORMAL HIGH (ref 70–99)

## 2020-07-17 LAB — BASIC METABOLIC PANEL
Anion gap: 8 (ref 5–15)
BUN: 57 mg/dL — ABNORMAL HIGH (ref 6–20)
CO2: 18 mmol/L — ABNORMAL LOW (ref 22–32)
Calcium: 8.4 mg/dL — ABNORMAL LOW (ref 8.9–10.3)
Chloride: 119 mmol/L — ABNORMAL HIGH (ref 98–111)
Creatinine, Ser: 3.25 mg/dL — ABNORMAL HIGH (ref 0.44–1.00)
GFR, Estimated: 15 mL/min — ABNORMAL LOW (ref >60)
Glucose, Bld: 76 mg/dL (ref 70–99)
Potassium: 5.6 mmol/L — ABNORMAL HIGH (ref 3.5–5.1)
Sodium: 145 mmol/L (ref 135–145)

## 2020-07-17 LAB — AEROBIC/ANAEROBIC CULTURE W GRAM STAIN (SURGICAL/DEEP WOUND)

## 2020-07-17 MED ORDER — MECLIZINE HCL 25 MG PO TABS
12.5000 mg | ORAL_TABLET | Freq: Three times a day (TID) | ORAL | Status: DC
  Administered 2020-07-17 – 2020-07-27 (×30): 12.5 mg via ORAL
  Filled 2020-07-17 (×26): qty 1
  Filled 2020-07-17: qty 0.5
  Filled 2020-07-17 (×3): qty 1

## 2020-07-17 MED ORDER — MECLIZINE HCL 25 MG PO TABS: 12.5000 mg | ORAL_TABLET | Freq: Three times a day (TID) | ORAL | Status: DC | PRN

## 2020-07-17 MED ORDER — METRONIDAZOLE 500 MG PO TABS
500.0000 mg | ORAL_TABLET | Freq: Three times a day (TID) | ORAL | Status: AC
  Administered 2020-07-17 – 2020-07-26 (×28): 500 mg via ORAL
  Filled 2020-07-17 (×27): qty 1

## 2020-07-17 MED ORDER — MECLIZINE HCL 25 MG PO TABS: 12.5000 mg | ORAL_TABLET | Freq: Three times a day (TID) | ORAL | Status: DC

## 2020-07-17 MED ORDER — SODIUM BICARBONATE 650 MG PO TABS
1300.0000 mg | ORAL_TABLET | Freq: Three times a day (TID) | ORAL | Status: DC
  Administered 2020-07-17 – 2020-07-29 (×36): 1300 mg via ORAL
  Filled 2020-07-17 (×36): qty 2

## 2020-07-17 MED ORDER — NEPRO/CARBSTEADY PO LIQD
237.0000 mL | Freq: Three times a day (TID) | ORAL | Status: DC
  Administered 2020-07-17 – 2020-07-23 (×16): 237 mL via ORAL

## 2020-07-17 NOTE — Consult Note (Addendum)
Cardiology Consultation:   Patient ID: Wanda [name] MRN: [id number]; DOB: Apr 21, 1961  Admit date: 07/31/2020 Date of Consult: 07/17/2020  Primary Care Provider: Lorene Dy, MD [facility] Cardiologist: [facility]  Patient Profile:   Wanda [name] is a 59 y.o. female with a hx of PAF on Eliquis, ESRD s/p renal transplant in 2013, HTN, DM, hypothyroidism, gastric sleeve, anemia or chronic kidney disease, PAD s/p R BKA 05/07/2020, recent endocarditis and COVID 19 on 06/2020 who is being seen today for the evaluation of possible mitral valve mass at the request of Dr. [place] [name].   Seen by Dr. Aundra Dubin once in 2011 for abnormal stress test which was done for pre-op renal transplant. She had a 5% reversible anteroseptal defect and a 5% reversible inferior defect. Follow up cath showed minimal irregularity in LAD. Felt false positive stress test.   The patient was seen at [facility] 12/2017 for surgical clearance of another transplant. Had normal Stress Echo.   History of Present Illness:   Wanda. [name] admitted 05/01/20-06/01/20 with septic shock 2nd to right foot ulcer and Klebsiella bacteremia. TTE showed mitral valve mass/vegitations. Unable to undergo TEE due to hypotension. Also underwent R BKA on 05/07/2020. She was discharge on abx using meropenem with end date of 06/27/2020.  She had COVID while at [name].  She presented back 07/08/2019 with 1 week hx of history of drainage with dehiscence of the stump site. Xray without evidence of osteomyelitis. She underwent irrigation and excisional of right leg wound w/ intermediate closure 10/12. Gram stain showed klebsiella pneumoniae and pseudomonas aeruginosa. Seen by ID yesterday and started on cefepime.   TTE echo 07/16/2020: Highly echogenic, mobile mass measuring 10 x 7 mm that is attached to  the ventricular side of the anterior mitral valve leaflet is unchanged  from the prior study on 05/18/2020 and possibly  represents a partially  detached calcifications.   Patient report intermittent shortness of breath. No chest pain or palpitation. Complains of "room spinning and headache" since yesterday. Unable to sleep properly.   Past Medical History:  Diagnosis Date  . Anemia   . Arthritis    "hands" (07/24/2013) pt denies  . Chronic kidney disease, stage IV (severe) ([place])    "never went on dialysis" (07/24/2013)  . Diabetes mellitus type II   . Diabetic peripheral neuropathy ([place])   . Dialysis patient [facility])    Monday Wednesday Friday   . Dyspnea on exertion    pt denies  . Foot pain   . GERD (gastroesophageal reflux disease)   . Gout   . History of chicken pox   . History of measles   . History of mumps   . Hyperlipidemia   . Hypothyroidism   . Migraines    "q 3 months" (07/24/2013)  . Nonspecific abnormal unspecified cardiovascular function study   . Obesity   . PAD (peripheral artery disease) ([place])   . Peripheral neuropathy   . Peripheral vascular disease ([place])   . Pneumonia 09/2012  . Renal transplant disorder   . Shingles   . Sleep apnea    "getting ready to get tested again cause dr says I have it" (07/24/2013) pt states was tested and was told she did not have sleep apnea / no cpap  . Unspecified essential hypertension     Past Surgical History:  Procedure Laterality Date  . AMPUTATION Right 05/07/2020   Procedure: AMPUTATION BELOW KNEE;  Surgeon: [name], MD;  Location: [place];  Service: Orthopedics;  Laterality: Right;  . AMPUTATION Right 07/27/2020   Procedure: Revision right below knee amputation;  Surgeon: [name], MD;  Location: [place];  Service: Orthopedics;  Laterality: Right;  Revision right below knee amputation  . AV FISTULA PLACEMENT Right 2000   "lower arm" (07/24/2013)  . AV FISTULA PLACEMENT, RADIOCEPHALIC Right 3825   "upper arm" (07/24/2013)  . CAPD REMOVAL N/A 10/10/2016   Procedure: CONTINUOUS AMBULATORY PERITONEAL DIALYSIS  (CAPD) CATHETER  REMOVAL;  Surgeon: Donnie Mesa, MD;  Location: [place];  Service: General;  Laterality: N/A;  . CATARACT EXTRACTION W/ INTRAOCULAR LENS  IMPLANT, BILATERAL Bilateral 2011  . [place]; 1987  . CHOLECYSTECTOMY  2011  . COLONOSCOPY    . ENDOMETRIAL CRYOABLATION  1980's  . INTRAUTERINE DEVICE (IUD) INSERTION  2011  . LAPAROSCOPIC GASTRIC SLEEVE RESECTION N/A 05/26/2015   Procedure: LAPAROSCOPIC GASTRIC SLEEVE RESECTION;  Surgeon: Excell Seltzer, MD;  Location: [facility];  Service: General;  Laterality: N/A;  . TRANSPLANTATION RENAL  2013   "right" (09/04/2012)  . TUBAL LIGATION  1987  . [place]   "RLE; stripped" (09/04/2012)  . VIDEO BRONCHOSCOPY  09/07/2012   Procedure: VIDEO BRONCHOSCOPY WITHOUT FLUORO;  Surgeon: Chesley Mires, MD;  Location: [facility];  Service: Cardiopulmonary;  Laterality: Bilateral;     Inpatient Medications: Scheduled Meds: . amiodarone  200 mg Oral Daily  . apixaban  2.5 mg Oral BID  . ascorbic acid  250 mg Oral BID  . cholecalciferol  1,000 Units Oral Daily  . docusate sodium  100 mg Oral BID  . feeding supplement (NEPRO CARB STEADY)  237 mL Oral TID BM  . folic acid  1 mg Oral Daily  . hydrocortisone  25 mg Rectal BID  . meclizine  12.5 mg Oral TID  . metroNIDAZOLE  500 mg Oral Q8H  . mirtazapine  7.5 mg Oral QHS  . multivitamin with minerals  1 tablet Oral Daily  . nortriptyline  25 mg Oral QHS  . pantoprazole  40 mg Oral Daily  . predniSONE  5 mg Oral Daily  . ramelteon  8 mg Oral QHS  . senna  1 tablet Oral BID  . senna-docusate  1 tablet Oral BID  . sodium bicarbonate  650 mg Oral TID  . sodium zirconium cyclosilicate  10 g Oral BID  . tacrolimus  1.5 mg Oral Q12H  . thiamine  100 mg Oral Daily  . zinc sulfate  220 mg Oral Daily   Continuous Infusions: . sodium chloride Stopped (07/15/20 1620)  . ceFEPime (MAXIPIME) IV 2 g (07/16/20 1715)   PRN Meds: acetaminophen, alum & mag hydroxide-simeth, bisacodyl,  dextrose, dicyclomine, diphenhydrAMINE, diphenhydrAMINE, morphine injection, naphazoline-glycerin, ondansetron **OR** ondansetron (ZOFRAN) IV, oxyCODONE, polyethylene glycol, traMADol  Allergies:    Allergies  Allergen Reactions  . Propoxyphene N-Acetaminophen Other (See Comments)    Severe headache  . Hydrocodone-Acetaminophen Other (See Comments)     Severe headache   . Astemizole Rash and Other (See Comments)    headache   . Cephalosporins Rash    Social History:   Social History   Socioeconomic History  . Marital status: Married    Spouse name: [name]  . Number of children: 2  . Years of education: 25  . Highest education level: Not on file  Occupational History  . Occupation: Retired- disabled  Tobacco Use  . Smoking status: Never Smoker  . Smokeless tobacco: Never Used  Vaping Use  .  Vaping Use: Never used  Substance and Sexual Activity  . Alcohol use: No  . Drug use: No  . Sexual activity: Yes  Other Topics Concern  . Not on file  Social History Narrative   Lives with husband and child.   Caffeine use: none   Social Determinants of Health   Financial Resource Strain:   . Difficulty of Paying Living Expenses: Not on file  Food Insecurity:   . Worried About Charity fundraiser in the Last Year: Not on file  . Ran Out of Food in the Last Year: Not on file  Transportation Needs:   . Lack of Transportation (Medical): Not on file  . Lack of Transportation (Non-Medical): Not on file  Physical Activity:   . Days of Exercise per Week: Not on file  . Minutes of Exercise per Session: Not on file  Stress:   . Feeling of Stress : Not on file  Social Connections:   . Frequency of Communication with Friends and Family: Not on file  . Frequency of Social Gatherings with Friends and Family: Not on file  . Attends Religious Services: Not on file  . Active Member of Clubs or Organizations: Not on file  . Attends Archivist Meetings: Not on file  . Marital  Status: Not on file  Intimate Partner Violence:   . Fear of Current or Ex-Partner: Not on file  . Emotionally Abused: Not on file  . Physically Abused: Not on file  . Sexually Abused: Not on file    Family History:   Family History  Problem Relation Age of Onset  . Colon cancer Father        deceased at age 24  . Diabetes Brother   . Neuropathy Neg Hx      ROS:  Please see the history of present illness.  All other ROS reviewed and negative.     Physical Exam/Data:   Vitals:   07/17/20 9798 07/17/20 0726 07/17/20 0727 07/17/20 1208  BP: 120/67 128/69  121/66  Pulse: 87 88 88 85  Resp: 16 18  20   Temp: 97.7 F (36.5 C) 97.8 F (36.6 C)  98.1 F (36.7 C)  TempSrc: Oral Oral  Oral  SpO2: 100% 100% 100% 100%  Weight:      Height:        Intake/Output Summary (Last 24 hours) at 07/17/2020 1310 Last data filed at 07/17/2020 0247 Gross per 24 hour  Intake 118 ml  Output 250 ml  Net -132 ml   Last 3 Weights 07/08/2020 05/31/2020 05/30/2020  Weight (lbs) 112 lb 3.4 oz 102 lb 0.1 oz 102 lb  Weight (kg) 50.9 kg 46.269 kg 46.267 kg     Body mass index is 16.57 kg/m.  General:  Thin frail ill appearing female in no acute distress HEENT: normal Lymph: no adenopathy Neck: no JVD Endocrine:  No thryomegaly Vascular: No carotid bruits; FA pulses 2+ bilaterally without bruits  Cardiac:  normal S1, S2; RRR; + murmur  Lungs:  clear to auscultation bilaterally, no wheezing, rhonchi or rales  Abd: soft, nontender, no hepatomegaly  Ext: no edema Musculoskeletal: ACE warp of RLE Skin: warm and dry  Neuro:  CNs 2-12 intact, no focal abnormalities noted Psych:  Normal affect   EKG:  The EKG was personally reviewed and demonstrates:  Sinus rhythm at 83 pbm Telemetry:  Telemetry was personally reviewed and demonstrates:  Currently not on tele   Relevant CV Studies:  TTE 07/16/2020  IMPRESSIONS   1. Highly echogenic, mobile mass measuring 10 x 7 mm that is attached to  the  ventricular side of the anterior mitral valve leaflet is unchanged  from the prior study on 05/18/2020 and possibly represents a partially  detached calcifications.  2. Left ventricular ejection fraction, by estimation, is 60 to 65%. The  left ventricle has normal function. The left ventricle has no regional  wall motion abnormalities. There is severe concentric left ventricular  hypertrophy. Left ventricular diastolic  parameters are consistent with Grade I diastolic dysfunction (impaired  relaxation). Elevated left atrial pressure.  3. Right ventricular systolic function is normal. The right ventricular  size is normal. There is normal pulmonary artery systolic pressure.  4. Left atrial size was mildly dilated.  5. There is no evidence of cardiac tamponade. Large pleural effusion in  the left lateral region.  6. The mitral valve is normal in structure. Mild to moderate mitral valve  regurgitation. No evidence of mitral stenosis. Moderate mitral annular  calcification.  7. Tricuspid valve regurgitation is mild to moderate.  8. The aortic valve is normal in structure. There is mild calcification  of the aortic valve. There is mild thickening of the aortic valve. Aortic  valve regurgitation is not visualized. No aortic stenosis is present.  9. The inferior vena cava is normal in size with greater than 50%  respiratory variability, suggesting right atrial pressure of 3 mmHg.   TTE 05/18/2020 1. Left ventricular ejection fraction, by estimation, is 60 to 65%. The  left ventricle has normal function.  2. Left atrial size was moderately dilated.  3. Effusion appears smaller than seen on echo 05/05/20 . The pericardial  effusion is lateral to the left ventricle and anterior to the right  ventricle.  4. MV leaflets are thickened and calcified with acoustic artifact There  is also moderate posterior annular calcification However there appears to  be a mobile mass/vegeation on the  ventricular surface at the base of the  anterior mitral leaflet that  extends into the LVOT Suggest TEE to further evaluate if clinically  indicated Similar appearance to echo done on 05/05/20 . The mitral valve is  abnormal. Trivial mitral valve regurgitation.  5. The aortic valve is tricuspid. Aortic valve regurgitation is not  visualized. Moderate Sclerosis.   TEE 05/05/2020 1. Left ventricular ejection fraction, by estimation, is 70 to 75%. The  left ventricle has hyperdynamic function. The left ventricle has no  regional wall motion abnormalities. There is moderate concentric left  ventricular hypertrophy. Indeterminate  diastolic filling due to E-A fusion.  2. Right ventricular systolic function is normal. The right ventricular  size is normal. Tricuspid regurgitation signal is inadequate for assessing  PA pressure.  3. Left atrial size was moderately dilated.  4. Moderate pericardial effusion. The pericardial effusion is  circumferential. There is no evidence of cardiac tamponade.  5. There is a small (8 x 2 mm) hyperechogenic mobile structure that  prolapses in the LV outflow tract. This would be a highly atypical  location for a vegetation. Suspect it is a segment of redundant mitral  subvalvular apparatus. The mitral valve is  degenerative. No evidence of mitral valve regurgitation.  6. The aortic valve is tricuspid. Aortic valve regurgitation is not  visualized. Mild to moderate aortic valve sclerosis/calcification is  present, without any evidence of aortic stenosis.  7. The inferior vena cava is normal in size with greater than 50%  respiratory variability, suggesting right atrial pressure of  3 mmHg.   Laboratory Data: Chemistry Recent Labs  Lab 07/15/20 0535 07/16/20 0334 07/17/20 0250  NA 145 144 145  K 5.3* 5.3* 5.6*  CL 120* 118* 119*  CO2 15* 17* 18*  GLUCOSE 73 88 76  BUN 52* 54* 57*  CREATININE 3.08* 3.02* 3.25*  CALCIUM 8.3* 8.2* 8.4*  GFRNONAA  16* 16* 15*  ANIONGAP 10 9 8     Recent Labs  Lab 07/31/2020 0403  PROT 5.2*  ALBUMIN 1.2*  AST 36  ALT 28  ALKPHOS 514*  BILITOT 0.5   Hematology Recent Labs  Lab 07/15/20 0535 07/16/20 0334 07/17/20 0250  WBC 11.1* 10.6* 12.4*  RBC 3.66* 3.46* 3.68*  HGB 10.3* 9.8* 10.5*  HCT 33.3* 31.5* 33.3*  MCV 91.0 91.0 90.5  MCH 28.1 28.3 28.5  MCHC 30.9 31.1 31.5  RDW 20.4* 20.1* 20.5*  PLT 146* 134* 146*    Radiology/Studies:  ECHOCARDIOGRAM COMPLETE  Result Date: 07/16/2020    ECHOCARDIOGRAM REPORT   Patient Name:   Wanda [name] Date of Exam: 07/16/2020 Medical Rec #:  [id number]         Height:       69.0 in Accession #:    [id number]        Weight:       112.2 lb Date of Birth:  May 28, 1961        BSA:          1.616 m Patient Age:    11 years          BP:           132/45 mmHg Patient Gender: F                 HR:           83 bpm. Exam Location:  Inpatient Procedure: 2D Echo, Cardiac Doppler and Color Doppler Indications:    Endocarditis I38  History:        Patient has prior history of Echocardiogram examinations, most                 recent 05/18/2020. Signs/Symptoms:DOE; Risk Factors:Hypertension,                 Diabetes, Dyslipidemia, Non-Smoker and Sleep Apnea. PAD.  Sonographer:    Vickie Epley RDCS Referring Phys: [id number] [place]  1. Highly echogenic, mobile mass measuring 10 x 7 mm that is attached to the ventricular side of the anterior mitral valve leaflet is unchanged from the prior study on 05/18/2020 and possibly represents a partially detached calcifications.  2. Left ventricular ejection fraction, by estimation, is 60 to 65%. The left ventricle has normal function. The left ventricle has no regional wall motion abnormalities. There is severe concentric left ventricular hypertrophy. Left ventricular diastolic  parameters are consistent with Grade I diastolic dysfunction (impaired relaxation). Elevated left atrial pressure.  3. Right ventricular  systolic function is normal. The right ventricular size is normal. There is normal pulmonary artery systolic pressure.  4. Left atrial size was mildly dilated.  5. There is no evidence of cardiac tamponade. Large pleural effusion in the left lateral region.  6. The mitral valve is normal in structure. Mild to moderate mitral valve regurgitation. No evidence of mitral stenosis. Moderate mitral annular calcification.  7. Tricuspid valve regurgitation is mild to moderate.  8. The aortic valve is normal in structure. There is mild calcification of the aortic valve. There is mild thickening of the aortic  valve. Aortic valve regurgitation is not visualized. No aortic stenosis is present.  9. The inferior vena cava is normal in size with greater than 50% respiratory variability, suggesting right atrial pressure of 3 mmHg. FINDINGS  Left Ventricle: Left ventricular ejection fraction, by estimation, is 60 to 65%. The left ventricle has normal function. The left ventricle has no regional wall motion abnormalities. The left ventricular internal cavity size was normal in size. There is  severe concentric left ventricular hypertrophy. Left ventricular diastolic parameters are consistent with Grade I diastolic dysfunction (impaired relaxation). Elevated left atrial pressure. Right Ventricle: The right ventricular size is normal. No increase in right ventricular wall thickness. Right ventricular systolic function is normal. There is normal pulmonary artery systolic pressure. The tricuspid regurgitant velocity is 2.58 m/s, and  with an assumed right atrial pressure of 8 mmHg, the estimated right ventricular systolic pressure is 95.2 mmHg. Left Atrium: Left atrial size was mildly dilated. Right Atrium: Right atrial size was normal in size. Pericardium: Trivial pericardial effusion is present. There is no evidence of cardiac tamponade. Mitral Valve: The mitral valve is normal in structure. There is moderate thickening of the mitral  valve leaflet(s). There is severe calcification of the mitral valve leaflet(s). Moderate mitral annular calcification. Mild to moderate mitral valve regurgitation. No evidence of mitral valve stenosis. Tricuspid Valve: The tricuspid valve is normal in structure. Tricuspid valve regurgitation is mild to moderate. No evidence of tricuspid stenosis. Aortic Valve: The aortic valve is normal in structure. There is mild calcification of the aortic valve. There is mild thickening of the aortic valve. Aortic valve regurgitation is not visualized. No aortic stenosis is present. There is no evidence of aortic valve vegetation. Pulmonic Valve: The pulmonic valve was normal in structure. Pulmonic valve regurgitation is not visualized. No evidence of pulmonic stenosis. Aorta: The aortic root is normal in size and structure. Venous: The inferior vena cava is normal in size with greater than 50% respiratory variability, suggesting right atrial pressure of 3 mmHg. IAS/Shunts: No atrial level shunt detected by color flow Doppler. Additional Comments: There is a large pleural effusion in the left lateral region.  LEFT VENTRICLE PLAX 2D LVIDd:         4.50 cm     Diastology LVIDs:         3.30 cm     LV e' medial:    3.89 cm/s LV PW:         1.00 cm     LV E/e' medial:  28.5 LV IVS:        1.00 cm     LV e' lateral:   4.62 cm/s                            LV E/e' lateral: 24.0  LV Volumes (MOD) LV vol d, MOD A2C: 74.8 ml LV vol d, MOD A4C: 88.3 ml LV vol s, MOD A2C: 27.8 ml LV vol s, MOD A4C: 44.1 ml LV SV MOD A2C:     47.0 ml LV SV MOD A4C:     88.3 ml LV SV MOD BP:      47.1 ml RIGHT VENTRICLE RV S prime:     12.10 cm/s TAPSE (M-mode): 1.8 cm LEFT ATRIUM             Index       RIGHT ATRIUM          Index LA diam:  2.70 cm 1.67 cm/m  RA Area:     9.47 cm LA Vol (A2C):   49.0 ml 30.32 ml/m RA Volume:   18.10 ml 11.20 ml/m LA Vol (A4C):   70.3 ml 43.50 ml/m LA Biplane Vol: 59.2 ml 36.63 ml/m  AORTIC VALVE LVOT Vmax:   77.70  cm/s LVOT Vmean:  48.700 cm/s LVOT VTI:    0.162 m MITRAL VALVE                TRICUSPID VALVE MV Area (PHT): 3.17 cm     TR Peak grad:   26.6 mmHg MV Decel Time: 239 msec     TR Vmax:        258.00 cm/s MR Peak grad: 102.0 mmHg MR Vmax:      505.00 cm/s   SHUNTS MV E velocity: 111.00 cm/s  Systemic VTI: 0.16 m MV A velocity: 147.00 cm/s MV E/A ratio:  0.76 Ena Dawley MD Electronically signed by Ena Dawley MD Signature Date/Time: 07/16/2020/4:23:08 PM    Final      Assessment and Plan:   1. Mitral valve endocarditis - Echo yesterday showed Highly echogenic, mobile mass measuring 10 x 7 mm that is attached to the ventricular side of the anterior mitral valve leaflet is unchanged from the prior study on 05/18/2020 and possibly represents a partially  detached calcifications.  - She was unable to undergo TEE on last admission due to hypotension.  - Her BP is stable  - mitral valve mass/vegitations essentially same compared to 2 months ago, Likely continued antibiotic  2. PAF - in sinus - Eliquis held 10/10-10/12 for surgical intervention -Continue anticoagulation with Eliquis 2.5 mg BID  - [place] Amiodarone [id number]}  CHA2DS2-VASc Score = 4  This indicates a 4.8% annual risk of stroke. The patient's score is based upon: CHF History: 0 HTN History: 1 Diabetes History: 1 Stroke History: 0 Vascular Disease History: 1 Age Score: 0 Gender Score: 1   3. Acute on CKD IV/ESRD s/p renal transplant - Baseline Scr 2.1-2.4 - Scr of 3.25 today - Per primary team  4. Hyperkalemia - K 5.6 today  - On Lokelma  For questions or updates, please contact [place] Please consult [email or web address] for contact info under    Signed, Leanor Kail, PA  07/17/2020 1:10 PM   I have personally seen and examined this patient. I agree with the assessment and plan as outlined above. History reviewed. We are called to comment on the abnormal finding on her echo. There is a "highly  echogenic, mobile mass measuring 10 x 7 mm that is attached to the ventricular side of the anterior mitral valve leaflet is unchanged  from the prior study on 05/18/2020 and possibly represents a partially  detached calcifications." This is unchanged from her echo in August 2021.  She has no complaints today Labs reviewed Echo images reviewed.  My exam:  General: Thin female, NAD Neuro: No focal deficits Psychiatric: Mood and affect normal  Neck: No JVD Lungs:Clear bilaterally, no wheezes, rhonci, crackles Cardiovascular: Regular rate and rhythm. No murmurs, gallops or rubs. Abdomen:Soft. Bowel sounds present.  Extremities: Right BKA, No edema left leg  Plan: I have reviewed her echo images from today and from August. The mass is in an atypical location for a vegetation This likely represents a segment of redundant mitral subvalvular apparatus. Her blood cultures have been negative this admission which also makes infection less likely. She has had extensive courses of antibiotics and is on chronic  Eliquis. I do not recommend a TEE at this time.   Lauree Chandler 07/17/2020 1:37 PM

## 2020-07-17 NOTE — Progress Notes (Signed)
[facility]  Date of Admission:  07/06/2020      Total days of antibiotics 2 Cefepime           ASSESSMENT: Wanda [name] is a 59 y.o. female POD3 following revision of R BKA incision due to purulent drainage and dehiscence. Polymicrobial wound infection involving Enterobacter cloacae, Pseudomonas aeruginosa and Klebsiella pneumoniae -- all of which are sensitive to Ciprofloxacin.  Her transthoracic echo is still showing a hypermobile echodensity on the underside of the mitral valve leaflet that may possibly be due to detatched calcification vs segment of redundant mitral subvalvular apparatus vs sterile endocarditis (?libman sachs). Seems like an atypical finding for vegetation however, and being her blood cultures were sterile during this hospitalization also argues against this being infectious. Has also been on chronic anticoagulation for Afib so would hope not thrombotic process. Given this is a persistent but new finding since 02/2020 study would like to see what Cards thinks and see if there is value in pursuing TEE.   As long as cardiology agrees that the echo abnormalities are unlikely r/t infective endocarditis likely OK to transition to oral renally dosed ciprofloxacin for short course of treatment ~10 days total (she is on chronic immunosuppression for kidney transplant).    PLAN: 1. Cardiology to help comment on abnormal Echo findings 2. Continue Cefepime for now pending #1 - likely home with short course of cipro  3. She may benefit from increasing Mirtazapine QHS      Principal Problem:   Wound dehiscence Active Problems:   Klebsiella pneumoniae infection   Pseudomonas infection   Atrial fibrillation (HCC)   AKI (acute kidney injury) (HCC)   Hyperkalemia   Metabolic acidosis   History of COVID-19   COVID-19 virus infection   Hypotension   . amiodarone  200 mg Oral Daily  . apixaban  2.5 mg Oral BID  . ascorbic acid  250  mg Oral BID  . cholecalciferol  1,000 Units Oral Daily  . docusate sodium  100 mg Oral BID  . feeding supplement (NEPRO CARB STEADY)  237 mL Oral TID BM  . folic acid  1 mg Oral Daily  . hydrocortisone  25 mg Rectal BID  . mirtazapine  7.5 mg Oral QHS  . multivitamin with minerals  1 tablet Oral Daily  . nortriptyline  25 mg Oral QHS  . pantoprazole  40 mg Oral Daily  . predniSONE  5 mg Oral Daily  . ramelteon  8 mg Oral QHS  . senna  1 tablet Oral BID  . senna-docusate  1 tablet Oral BID  . sodium bicarbonate  650 mg Oral TID  . sodium zirconium cyclosilicate  10 g Oral BID  . tacrolimus  1.5 mg Oral Q12H  . thiamine  100 mg Oral Daily  . zinc sulfate  220 mg Oral Daily    SUBJECTIVE: Doing OK. Slept poorly. Has had a poor appetite since her previous hospitalization that has been worse since COIVD infection. Food just does not taste good and has no appetite - requesting consideration for something to fix this.   Having some throbbing right leg pain.    Review of Systems: Review of Systems  Constitutional: Positive for weight loss. Negative for chills and fever.  HENT: Negative for ear pain and tinnitus.   Respiratory: Negative for cough, sputum production and shortness of breath.   Cardiovascular: Negative for chest pain and leg swelling.  Genitourinary: Negative for dysuria.  Musculoskeletal: Positive for joint pain. Negative for back pain.  Neurological: Positive for dizziness and weakness.  Psychiatric/Behavioral: Negative for depression. The patient has insomnia.     Allergies  Allergen Reactions  . Propoxyphene N-Acetaminophen Other (See Comments)    Severe headache  . Hydrocodone-Acetaminophen Other (See Comments)     Severe headache   . Astemizole Rash and Other (See Comments)    headache   . Cephalosporins Rash    OBJECTIVE: Vitals:   07/16/20 2238 07/17/20 0608 07/17/20 0726 07/17/20 0727  BP: (!) 112/43 120/67 128/69   Pulse: 89 87 88 88  Resp: 18  16 18    Temp: 98.2 F (36.8 C) 97.7 F (36.5 C) 97.8 F (36.6 C)   TempSrc: Oral Oral Oral   SpO2: 100% 100% 100% 100%  Weight:      Height:       Body mass index is 16.57 kg/m.  Physical Exam Constitutional:      Comments: Resting in bed comfortably.   HENT:     Mouth/Throat:     Mouth: Mucous membranes are moist.     Pharynx: Oropharynx is clear. No oropharyngeal exudate.  Eyes:     General: No scleral icterus.    Pupils: Pupils are equal, round, and reactive to light.  Cardiovascular:     Rate and Rhythm: Normal rate and regular rhythm.     Heart sounds: Murmur heard.   Pulmonary:     Effort: Pulmonary effort is normal. No respiratory distress.     Breath sounds: Normal breath sounds.  Abdominal:     General: There is no distension.     Tenderness: There is no abdominal tenderness.  Musculoskeletal:        General: No swelling.     Comments: RLE wrapped in clean dry ACE wrap   Skin:    General: Skin is warm and dry.  Neurological:     Mental Status: She is alert and oriented to person, place, and time.     Lab Results Lab Results  Component Value Date   WBC 12.4 (H) 07/17/2020   HGB 10.5 (L) 07/17/2020   HCT 33.3 (L) 07/17/2020   MCV 90.5 07/17/2020   PLT 146 (L) 07/17/2020    Lab Results  Component Value Date   CREATININE 3.25 (H) 07/17/2020   BUN 57 (H) 07/17/2020   NA 145 07/17/2020   K 5.6 (H) 07/17/2020   CL 119 (H) 07/17/2020   CO2 18 (L) 07/17/2020    Lab Results  Component Value Date   ALT 28 07/18/2020   AST 36 07/28/2020   ALKPHOS 514 (H) 07/25/2020   BILITOT 0.5 07/15/2020     Microbiology: Recent Results (from the past 240 hour(s))  Culture, blood (routine x 2)     Status: None   Collection Time: 07/17/2020  5:00 PM   Specimen: BLOOD  Result Value Ref Range Status   Specimen Description BLOOD LEFT ANTECUBITAL  Final   Special Requests   Final    BOTTLES DRAWN AEROBIC AND ANAEROBIC Blood Culture results may not be optimal due to  an inadequate volume of blood received in culture bottles   Culture   Final    NO GROWTH 5 DAYS Performed at [facility], [place] [address], [place], [place] [address]    Report Status 07/12/2020 FINAL  Final  Culture, blood (routine x 2)     Status: None   Collection Time: 07/15/2020  6:06 PM   Specimen: BLOOD  Result Value Ref Range Status   Specimen Description BLOOD LEFT ANTECUBITAL  Final   Special Requests   Final    BOTTLES DRAWN AEROBIC AND ANAEROBIC Blood Culture adequate volume   Culture   Final    NO GROWTH 5 DAYS Performed at [facility], [address] [place], [place] [address]    Report Status 07/12/2020 FINAL  Final  Respiratory Panel by RT PCR (Flu A&B, Covid) - Nasopharyngeal Swab     Status: Abnormal   Collection Time: 07/16/2020  9:00 PM   Specimen: Nasopharyngeal Swab  Result Value Ref Range Status   SARS Coronavirus 2 by RT PCR POSITIVE (A) NEGATIVE Final    Comment: RESULT CALLED TO, READ BACK BY AND VERIFIED WITH: B ROUNDTREE RN 07/28/2020 2217 JDW (NOTE) SARS-CoV-2 target nucleic acids are DETECTED.  SARS-CoV-2 RNA is generally detectable in upper respiratory specimens  during the acute phase of infection. Positive results are indicative of the presence of the identified virus, but do not rule out bacterial infection or co-infection with other pathogens not detected by the test. Clinical correlation with patient history and other diagnostic information is necessary to determine patient infection status. The expected result is Negative.  Fact Sheet for Patients:  PinkCheek.be  Fact Sheet for Healthcare Providers: GravelBags.it  This test is not yet approved or cleared by the [place] FDA and  has been authorized for detection and/or diagnosis of SARS-CoV-2 by FDA under an Emergency Use Authorization (EUA).  This EUA will remain in effect (meaning this test can be use d) for the  duration of  the COVID-19 declaration under Section 564(b)(1) of the Act, 21 U.S.C. section 360bbb-3(b)(1), unless the authorization is terminated or revoked sooner.      Influenza A by PCR NEGATIVE NEGATIVE Final   Influenza B by PCR NEGATIVE NEGATIVE Final    Comment: (NOTE) The Xpert Xpress SARS-CoV-2/FLU/RSV assay is intended as an aid in  the diagnosis of influenza from Nasopharyngeal swab specimens and  should not be used as a sole basis for treatment. Nasal washings and  aspirates are unacceptable for Xpert Xpress SARS-CoV-2/FLU/RSV  testing.  Fact Sheet for Patients: PinkCheek.be  Fact Sheet for Healthcare Providers: GravelBags.it  This test is not yet approved or cleared by the [place] FDA and  has been authorized for detection and/or diagnosis of SARS-CoV-2 by  FDA under an Emergency Use Authorization (EUA). This EUA will remain  in effect (meaning this test can be used) for the duration of the  Covid-19 declaration under Section 564(b)(1) of the Act, 21  U.S.C. section 360bbb-3(b)(1), unless the authorization is  terminated or revoked. Performed at [facility], [place] [address] [place], [place] [address]   MRSA PCR Screening     Status: None   Collection Time: 07/08/2020  1:10 PM   Specimen: Nasal Mucosa; Nasopharyngeal  Result Value Ref Range Status   MRSA by PCR NEGATIVE NEGATIVE Final    Comment:        The GeneXpert MRSA Assay (FDA approved for NASAL specimens only), is one component of a comprehensive MRSA colonization surveillance program. It is not intended to diagnose MRSA infection nor to guide or monitor treatment for MRSA infections. Performed at [facility], [place] [address] [place], [place] [address]   Aerobic/Anaerobic Culture (surgical/deep wound)     Status: None (Preliminary result)   Collection Time: 07/25/2020  1:46 PM   Specimen:  PATH Soft tissue  Result Value Ref  Range Status   Specimen Description TISSUE RIGHT LEG  Final   Special Requests DEEP SPEC A  Final   Gram Stain   Final    FEW WBC PRESENT,BOTH PMN AND MONONUCLEAR RARE GRAM POSITIVE COCCI RARE GRAM NEGATIVE RODS    Culture   Final    FEW KLEBSIELLA PNEUMONIAE FEW ENTEROBACTER CLOACAE FEW PSEUDOMONAS AERUGINOSA HOLDING FOR POSSIBLE ANAEROBE Performed at [facility], [place] [address], [place],  [address]    Report Status PENDING  Incomplete   Organism ID, Bacteria KLEBSIELLA PNEUMONIAE  Final   Organism ID, Bacteria ENTEROBACTER CLOACAE  Final   Organism ID, Bacteria PSEUDOMONAS AERUGINOSA  Final      Susceptibility   Enterobacter cloacae - MIC*    CEFAZOLIN >=64 RESISTANT Resistant     CEFEPIME <=0.12 SENSITIVE Sensitive     CEFTAZIDIME <=1 SENSITIVE Sensitive     CIPROFLOXACIN <=0.25 SENSITIVE Sensitive     GENTAMICIN <=1 SENSITIVE Sensitive     IMIPENEM 0.5 SENSITIVE Sensitive     TRIMETH/SULFA <=20 SENSITIVE Sensitive     PIP/TAZO <=4 SENSITIVE Sensitive     * FEW ENTEROBACTER CLOACAE   Klebsiella pneumoniae - MIC*    AMPICILLIN RESISTANT Resistant     CEFAZOLIN <=4 SENSITIVE Sensitive     CEFEPIME <=0.12 SENSITIVE Sensitive     CEFTAZIDIME <=1 SENSITIVE Sensitive     CEFTRIAXONE <=0.25 SENSITIVE Sensitive     CIPROFLOXACIN <=0.25 SENSITIVE Sensitive     GENTAMICIN <=1 SENSITIVE Sensitive     IMIPENEM 0.5 SENSITIVE Sensitive     TRIMETH/SULFA <=20 SENSITIVE Sensitive     AMPICILLIN/SULBACTAM <=2 SENSITIVE Sensitive     PIP/TAZO <=4 SENSITIVE Sensitive     * FEW KLEBSIELLA PNEUMONIAE   Pseudomonas aeruginosa - MIC*    CEFTAZIDIME 4 SENSITIVE Sensitive     CIPROFLOXACIN <=0.25 SENSITIVE Sensitive     GENTAMICIN <=1 SENSITIVE Sensitive     IMIPENEM 1 SENSITIVE Sensitive     PIP/TAZO 8 SENSITIVE Sensitive     CEFEPIME 2 SENSITIVE Sensitive     * FEW PSEUDOMONAS AERUGINOSA     Janene Madeira, MSN, NP-C [facility] [email or web address] Pager: [phone] Office: [phone] [place]: [phone]

## 2020-07-17 NOTE — Consult Note (Signed)
BRITLEY GASHI Admit Date: 07/09/2020 07/17/2020 Rexene Agent Requesting Physician:  [place] Pink MD  Reason for Consult:  CKD4T, Hyperkalemia, Acidosis HPI:  32F hx/o CKD4-T, most recent KT in [facility] 06/2019 maintained on Tac/Pred (no antimetabolite hx/o BK Viremia) admitted 10/5 dehiscence and purulent drainage of the right BKA stump.  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  She was treated with a prolonged course of meropenem for presumed endocarditis.  She went to the OR on 1012 for wound revision.  Cultures demonstrate polymicrobial infection and she is currently on cefepime and metronidazole with RCID following.  Repeat TTE with a mobile echodensity at the mitral valve but seen by cardiology and not thought to be consistent with endocarditis.  Patient's baseline creatinine is around 2.7-3.3.  In the past 3 days it has increased to 3.25 but it is not clear that this is outside of her historical values.  Further, she has mild hyperkalemia requiring twice daily Lokelma with a value of 5.6 today.  She has persistent acidosis with an HCO3 of 18, usually ranging between 15 and 19.  Current medications include prednisone 5 mg daily, sodium bicarbonate 650 mg 3 times daily, Lokelma 10 g twice daily, Prograf 1.5 mg twice daily.  She is sleeping but awakens and answers questions.  She has no specific complaints at the current time.  She states that she is eating but has a full meal tray at the current time.  Urine output is not completely measured.  Blood pressures have been stable.  No weights.  PMH Incudes:  AFib  GERD  PAD  Hx/o gastric sleeve  DM2   Creat (mg/dL)  Date Value  07/10/2014 8.80 (H)   Creatinine, Ser (mg/dL)  Date Value  07/17/2020 3.25 (H)  07/16/2020 3.02 (H)  07/15/2020 3.08 (H)  07/15/2020 2.99 (H)  07/13/2020  2.85 (H)  07/12/2020 2.74 (H)  07/11/2020 2.79 (H)  07/10/2020 2.86 (H)  07/03/2020 3.08 (H)  07/08/2020 3.17 (H)  ] I/Os: I/O last 3 completed shifts: In: 621.5 [P.O.:598; I.V.:23.5] Out: 300 [Urine:300]   ROS NSAIDS: no exposure IV Contrast no exposure TMP/SMX no exposure Hypotension not present Balance of 12 systems is negative w/ exceptions as above  PMH  Past Medical History:  Diagnosis Date  . Anemia   . Arthritis    "hands" (07/24/2013) pt denies  . Chronic kidney disease, stage IV (severe) ([place])    "never went on dialysis" (07/24/2013)  . Diabetes mellitus type II   . Diabetic peripheral neuropathy ([place])   . Dialysis patient [facility])    Monday Wednesday Friday   . Dyspnea on exertion    pt denies  . Foot pain   . GERD (gastroesophageal reflux disease)   . Gout   . History of chicken pox   . History of measles   . History of mumps   . Hyperlipidemia   . Hypothyroidism   . Migraines    "q 3 months" (07/24/2013)  . Nonspecific abnormal unspecified cardiovascular function study   . Obesity   . PAD (peripheral artery disease) ([place])   . Peripheral neuropathy   . Peripheral vascular disease ([place])   . Pneumonia 09/2012  . Renal transplant disorder   . Shingles   . Sleep apnea    "getting ready to get tested again cause dr says I have it" (07/24/2013) pt states was  tested and was told she did not have sleep apnea / no cpap  . Unspecified essential hypertension    PSH  Past Surgical History:  Procedure Laterality Date  . AMPUTATION Right 05/07/2020   Procedure: AMPUTATION BELOW KNEE;  Surgeon: [name], MD;  Location: [facility];  Service: Orthopedics;  Laterality: Right;  . AMPUTATION Right 07/13/2020   Procedure: Revision right below knee amputation;  Surgeon: [name], MD;  Location: [facility];  Service: Orthopedics;  Laterality: Right;  Revision right below knee amputation  . AV FISTULA PLACEMENT Right 2000   "lower arm" (07/24/2013)  . AV FISTULA  PLACEMENT, RADIOCEPHALIC Right 2426   "upper arm" (07/24/2013)  . CAPD REMOVAL N/A 10/10/2016   Procedure: CONTINUOUS AMBULATORY PERITONEAL DIALYSIS  (CAPD) CATHETER REMOVAL;  Surgeon: Donnie Mesa, MD;  Location: [place];  Service: General;  Laterality: N/A;  . CATARACT EXTRACTION W/ INTRAOCULAR LENS  IMPLANT, BILATERAL Bilateral 2011  . [place]; 1987  . CHOLECYSTECTOMY  2011  . COLONOSCOPY    . ENDOMETRIAL CRYOABLATION  1980's  . INTRAUTERINE DEVICE (IUD) INSERTION  2011  . LAPAROSCOPIC GASTRIC SLEEVE RESECTION N/A 05/26/2015   Procedure: LAPAROSCOPIC GASTRIC SLEEVE RESECTION;  Surgeon: Excell Seltzer, MD;  Location: [facility];  Service: General;  Laterality: N/A;  . TRANSPLANTATION RENAL  2013   "right" (09/04/2012)  . TUBAL LIGATION  1987  . [place]   "RLE; stripped" (09/04/2012)  . VIDEO BRONCHOSCOPY  09/07/2012   Procedure: VIDEO BRONCHOSCOPY WITHOUT FLUORO;  Surgeon: Chesley Mires, MD;  [facility] ENDOSCOPY;  Service: Cardiopulmonary;  Laterality: Bilateral;   FH  Family History  Problem Relation Age of Onset  . Colon cancer Father        deceased at age 62  . Diabetes Brother   . Neuropathy Neg Hx    SH  reports that she has never smoked. She has never used smokeless tobacco. She reports that she does not drink alcohol and does not use drugs. Allergies  Allergies  Allergen Reactions  . Propoxyphene N-Acetaminophen Other (See Comments)    Severe headache  . Hydrocodone-Acetaminophen Other (See Comments)     Severe headache   . Astemizole Rash and Other (See Comments)    headache   . Cephalosporins Rash   Home medications Prior to Admission medications   Medication Sig Start Date End Date Taking? Authorizing Provider  amiodarone (PACERONE) 200 MG tablet Take 200 mg by mouth daily.    [provider]  apixaban (ELIQUIS) 5 MG TABS tablet Take 5 mg by mouth every 12 (twelve) hours.    [provider]  ascorbic acid  (VITAMIN C) 250 MG tablet Take 1 tablet (250 mg total) by mouth 2 (two) times daily. 06/01/20   Pokhrel, Corrie Mckusick, MD  atorvastatin (LIPITOR) 20 MG tablet Take 20 mg by mouth at bedtime.     [provider]  Cholecalciferol (VITAMIN D-3) 25 MCG (1000 UT) CAPS Take 1,000 Units by mouth daily.    [provider]  dicyclomine (BENTYL) 10 MG capsule Take 10 mg by mouth every 8 (eight) hours as needed for spasms.     [provider]  feeding supplement, GLUCERNA SHAKE, (GLUCERNA SHAKE) LIQD Take 237 mLs by mouth in the morning, at noon, and at bedtime.    [provider]  folic acid (FOLVITE) 1 MG tablet Take 1 mg by mouth daily.    [provider]  hydrocortisone (ANUSOL-HC) 25 MG suppository Place 25  mg rectally 2 (two) times daily.    [provider]  hydrOXYzine (ATARAX/VISTARIL) 10 MG tablet Take 10 mg by mouth in the morning, at noon, in the evening, and at bedtime.    [provider]  Infant Care Products (DERMACLOUD) CREA Apply 1 application topically daily as needed (Applu to buttock for irritaion).    [provider]  mirtazapine (REMERON) 7.5 MG tablet Take 1 tablet (7.5 mg total) by mouth at bedtime. 06/01/20   Pokhrel, Corrie Mckusick, MD  Multiple Vitamin (MULTIVITAMIN WITH MINERALS) TABS tablet Take 1 tablet by mouth daily.    [provider]  naphazoline-glycerin (CLEAR EYES REDNESS) 0.012-0.2 % SOLN Place 1-2 drops into the right eye 4 (four) times daily as needed for eye irritation. 06/01/20   Pokhrel, Corrie Mckusick, MD  nortriptyline (PAMELOR) 25 MG capsule TAKE 1 CAPSULE (25 MG TOTAL) BY MOUTH AT BEDTIME. 04/01/19   Patel, Domenick Bookbinder, MD  nystatin (MYCOSTATIN/NYSTOP) powder Apply 1 application topically 3 (three) times daily. Apply to groin    [provider]  ondansetron (ZOFRAN) 4 MG tablet Take 1 tablet (4 mg total) by mouth every 6 (six) hours as needed for nausea. 06/01/20   Pokhrel, Corrie Mckusick, MD  oxycodone (OXY-IR) 5  MG capsule Take 5 mg by mouth every 6 (six) hours as needed for pain.    [provider]  pantoprazole (PROTONIX) 40 MG tablet Take 40 mg by mouth daily. 01/16/18   [provider]  Patiromer Sorbitex Calcium (VELTASSA) 16.8 g PACK Take 16.8 mg by mouth daily. Mix in orange juice and drink    [provider]  predniSONE (DELTASONE) 5 MG tablet Take 1 tablet (5 mg total) by mouth daily. 06/01/20   Pokhrel, Corrie Mckusick, MD  ramelteon (ROZEREM) 8 MG tablet Take 8 mg by mouth at bedtime.     [provider]  senna-docusate (SENOKOT-S) 8.6-50 MG tablet Take 1 tablet by mouth daily. 06/02/20   Pokhrel, Corrie Mckusick, MD  sodium bicarbonate 650 MG tablet Take 1 tablet (650 mg total) by mouth 2 (two) times daily. 06/01/20   Pokhrel, Corrie Mckusick, MD  tacrolimus (PROGRAF) 0.5 MG capsule Take 1.5 mg by mouth every 12 (twelve) hours.     [provider]  thiamine 100 MG tablet Take 100 mg by mouth daily.    [provider]  zinc sulfate 220 (50 Zn) MG capsule Take 1 capsule (220 mg total) by mouth daily. 06/02/20   Pokhrel, Corrie Mckusick, MD    Current Medications Scheduled Meds: . amiodarone  200 mg Oral Daily  . apixaban  2.5 mg Oral BID  . ascorbic acid  250 mg Oral BID  . cholecalciferol  1,000 Units Oral Daily  . docusate sodium  100 mg Oral BID  . feeding supplement (NEPRO CARB STEADY)  237 mL Oral TID BM  . folic acid  1 mg Oral Daily  . hydrocortisone  25 mg Rectal BID  . meclizine  12.5 mg Oral TID  . metroNIDAZOLE  500 mg Oral Q8H  . mirtazapine  7.5 mg Oral QHS  . multivitamin with minerals  1 tablet Oral Daily  . nortriptyline  25 mg Oral QHS  . pantoprazole  40 mg Oral Daily  . predniSONE  5 mg Oral Daily  . ramelteon  8 mg Oral QHS  . senna  1 tablet Oral BID  . senna-docusate  1 tablet Oral BID  . sodium bicarbonate  650 mg Oral TID  . sodium zirconium cyclosilicate  10 g  Oral BID  . tacrolimus  1.5 mg Oral Q12H  . thiamine  100 mg Oral Daily  . zinc  sulfate  220 mg Oral Daily   Continuous Infusions: . sodium chloride Stopped (07/15/20 1620)  . ceFEPime (MAXIPIME) IV 2 g (07/16/20 1715)   PRN Meds:.acetaminophen, alum & mag hydroxide-simeth, bisacodyl, dextrose, dicyclomine, diphenhydrAMINE, diphenhydrAMINE, morphine injection, naphazoline-glycerin, ondansetron **OR** ondansetron (ZOFRAN) IV, oxyCODONE, polyethylene glycol, traMADol  CBC Recent Labs  Lab 07/15/20 0535 07/16/20 0334 07/17/20 0250  WBC 11.1* 10.6* 12.4*  HGB 10.3* 9.8* 10.5*  HCT 33.3* 31.5* 33.3*  MCV 91.0 91.0 90.5  PLT 146* 134* 094*   Basic Metabolic Panel Recent Labs  Lab 07/11/20 0225 07/12/20 0226 07/13/20 0250 07/27/2020 0403 07/15/20 0535 07/16/20 0334 07/17/20 0250  NA 141 143 144 144 145 144 145  K 5.8* 5.3* 5.4* 5.8* 5.3* 5.3* 5.6*  CL 116* 118* 119* 119* 120* 118* 119*  CO2 16* 17* 19* 17* 15* 17* 18*  GLUCOSE 80 76 76 43* 73 88 76  BUN 43* 45* 47* 50* 52* 54* 57*  CREATININE 2.79* 2.74* 2.85* 2.99* 3.08* 3.02* 3.25*  CALCIUM 8.0* 8.1* 8.1* 8.4* 8.3* 8.2* 8.4*    Physical Exam  Blood pressure 121/66, pulse 85, temperature 98.1 F (36.7 C), temperature source Oral, resp. rate 20, height 5\' 9"  (1.753 m), weight 50.9 kg, SpO2 100 %. GEN: chronically ill appearing, sleeping ENT: temporal wasting EYES: EOMI CV: Regular, nl s1s2 PULM: CTAB ABD: soft SKIN: RLE bandagd EXT:No sig LLE edema.  Unna boot   Assessment 34F s/p KT 2020 with CKD4T Tac/Pred; admitted with dehisced and infected recent right BKA now on broad-spectrum antibiotics; persistent hyperkalemia and metabolic acidosis.  1. CKD 4T: Probably at her baseline, I am not sure there is an acute component.  Continue current transplant regimen. 2. Hyperkalemia: Likely from a type IV RTA and impaired GFR; stable if K is less than 6, continue twice daily low,, see #3 3. Chronic metabolic acidosis, from type IV RTA, increase sodium bicarbonate to 1300 mg 3 times weekly 4. Infected and  dehisced right leg BKA status post revision and now on cefepime and metronidazole, RCID following, per them, ortho, and primary 5. DM2 6. AFib 7. Anemia, Hb stable 8. HTN, BP stable  Plan 1. As above 2. Daily weights, Daily Renal Panel, Strict I/Os, Avoid nephrotoxins (NSAIDs, judicious IV Contrast)  3. Will follow along   Rexene Agent   07/17/2020, 2:37 PM

## 2020-07-17 NOTE — Progress Notes (Signed)
PT Cancellation Note  Patient Details Name: Wanda [name] MRN: [id number] DOB: 1961/02/25   Cancelled Treatment:    Reason Eval/Treat Not Completed: Patient declined, no reason specified patient politely refuses therapy today due to severe dizziness, reports it started yesterday when she was turning while bathing and feels like she is spinning round and round every time she has to turn now. Offered formal vestibular assessment, she declines right now due to severe nausea but agreeable to therapy returning later today (if time/schedule allow) or tomorrow morning for vestibular evaluation.    Windell Norfolk, DPT, PN1   Supplemental Physical Therapist [facility]    Pager [phone] Acute Rehab Office [phone]

## 2020-07-17 NOTE — Progress Notes (Signed)
Nutrition Follow-up  DOCUMENTATION CODES:   Severe malnutrition in context of chronic illness  INTERVENTION:  D/c Ensure  D/c Prosource Plus  Nepro Shake po TID, each supplement provides 425 kcal and 19 grams protein   Continue MVI daily   NUTRITION DIAGNOSIS:   Severe Malnutrition related to chronic illness as evidenced by energy intake < or equal to 75% for > or equal to 1 month, percent weight loss.  Ongoing  GOAL:   Patient will meet greater than or equal to 90% of their needs  Progressing  MONITOR:   PO intake, Supplement acceptance, Weight trends, Labs, I & O's, Skin  REASON FOR ASSESSMENT:   Consult Assessment of nutrition requirement/status  ASSESSMENT:   Pt admitted with RLE stump site wound dehiscence. PMH includes A.fib., CKD stage IV s/p renal transplant, anemia of CKD, gastric sleeve, hypoalbuminemia, type 2 DM, HTN, hypothyroidism, GERD, peripheral artery disease, chronic neuropathy, and recent admission 7/30-8/30 for septic shock 2/2 R foot ulcer w/ Klebsiella bacteremia complicated by endocarditis. 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 Pt recently COVID-53 positive; last day of quarantine was 10/1.   10/12 s/p Irrigation and excisional debridement of R leg wound 15 cm x 3 cm x 2 cm; Intermediate closure of R leg wound 15 cm long with extensive undermining  Per MD, pt to have TTE while admitted to follow up on endocarditis.   Pt's intake has improved since last RD assessment. Pt has consumed 15-90% x4 documented meals (56.25% average meal completion). Per RN, pt is refusing Prosource Plus (ordered TID), but is typically accepting Ensure Enlive (ordered TID). Will d/c Prosource. Given pt's hyperkalemia, will transition to Nepro.   Labs: K+ 5.6 (H) Medications: vitamin c, vitamin D3, colace, folvite, remeron, mvi, protonix, deltasone, senokot, senokot-s, sodium bicarbonate, lokelma, thiamine, zinc sulfate  Diet Order:   Diet Order            Diet regular Room  service appropriate? Yes; Fluid consistency: Thin  Diet effective now                 EDUCATION NEEDS:   No education needs have been identified at this time  Skin:  Skin Assessment: Skin Integrity Issues: Skin Integrity Issues:: Incisions, Stage II, Unstageable Stage II: L buttocks Unstageable: L heel Incisions: leg  Last BM:  10/14  Height:   Ht Readings from Last 1 Encounters:  07/10/20 5\' 9"  (1.753 m)    Weight:   Wt Readings from Last 1 Encounters:  07/08/20 50.9 kg    BMI:  Body mass index is 16.57 kg/m.  Estimated Nutritional Needs:   Kcal:  2000-2200  Protein:  100-115 grams  Fluid:  >/=2L/d    Larkin Ina, MS, RD, LDN RD pager number and weekend/on-call pager number located in [place].

## 2020-07-17 NOTE — Progress Notes (Addendum)
Wanda [name]  BJY:[id number] DOB: May 14, 1961 DOA: 07/25/2020 PCP: Lorene Dy, MD    Brief Narrative:  [phone] with a history of PAF on Eliquis, CKD stage IV status post renal transplant, anemia of CKD, gastric sleeve, hypoalbuminemia, DM2, HTN, hypothyroidism, GERD, PAD, and chronic neuropathy who was admitted to the hospital 05/01/2020-06/01/2020 in septic shock secondary to a R foot ulcer with Klebsiella bacteremia complicated by endocarditis.  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  She returned to the ED 10/5 with a 1 week history of drainage and dehiscence of the stump site.  In the ED the patient was found to have an elevated WBC.  X-ray right tibia/fibula was without evidence of osseous abnormality.  She was found to be hyperkalemic at 6.0 and in acute renal failure with creatinine 3.4 up from baseline of 2.4.    Significant Events:  7/30 > 8/30 admit right foot cellulitis and Klebsiella bacteremia > R BKA 10/5 admit w/ AKI, hyperkalemia, and wound dehiscence  10/12 to OR for wound revision   Antimicrobials:  none  DVT prophylaxis: Eliquis  Subjective: Patient continues to have pain in her right lower extremity but better than before and well controlled with pain medications.  Denies any other complaints at this time.     Assessment & Plan:  R BKA stump site dehiscence/wound infection/questionable history of endocarditis X-ray without evidence to suggest osteomyelitis. She finished a prolonged course of IV meropenem 9/25.  Blood cultures without any growth so far. Ortho performed irrigation and excisional debridement of right leg wound w/ intermediate closure 10/12. She will be nonweightbearing on the right lower extremity. Gram stain of the deep tissue cultures show gram-negative rods along with GPC.  Wound cultures grew out Klebsiella.  Infectious disease was consulted due to patient previous  history.   Patient started on cefepime.  Cardiology has been consulted due to abnormal findings on echocardiogram with a possible mobile density on the mitral valve.  Vertigo Over the last 24 hours patient has experienced dizziness which she describes as if the room is spinning around her.  This mainly occurs with movement.  Whenever she is turned.  She does report some ear issues.  Presentation consistent with the vertigo.  No focal neurological deficits noted.  Will initiate meclizine.  Recent COVID Diagnosed with Covid 9/10 while at [facility] and quarantined for 14 days - last day of quarantine was 10/1 - CXR notes resolved bilateral lung opacities compared to previous exam -entirely asymptomatic from this regard -no indication for isolation  Acute kidney injury on CKD stage IV status post renal transplant/metabolic acidosis Baseline creatinine 2.1-2.4 Creatinine 3.4 at presentation.  It appears that this was discussed with nephrology who suggested volume resuscitation which was provided.   Renal function has been stable for the last 2 to 3 days however noted to be worsened again today.  Potassium remains elevated despite Lokelma.  Patient does have good urine output.  In view of her worsening renal function we will go ahead and consult nephrology. She is followed by Dr. Posey Pronto at [facility].  Continue oral sodium bicarbonate.  Bicarbonate level is stable. Noted to be on tacrolimus and prednisone.  Hyperkalemia Lokelma dose was increased yesterday.  Potassium noted to be higher today.  Defer to nephrology.  Hypotension Likely due to volume depletion - resolved with ongoing volume resuscitation -random serum cortisol not low  Chronic paroxysmal atrial fibrillation Continue amiodarone.  Eliquis was resumed.  DM 2 - controlled but with chronic neuropathy Does not require home medical therapy.  Occasional low glucose levels noted.  Likely due to poor oral intake.   Not noted to be on any glucose lowering agents here in the hospital.  Hypothyroidism Reported history of such but is not on thyroid hormone replacement - TSH signif elevated at 23.73 - free T4 actually normal at 0.8 - cont w/o supplementation for now - suggest recheck of TSH in 6-8 weeks   Normocytic anemia Likely due to chronic disease.  No evidence of overt bleeding.  Continue to monitor.  Severe malnutrition in context of chronic illness Nutrition consulted  DVT prophylaxis: Eliquis Code Status: FULL CODE Family Communication: Discussed with the patient.  No family at bedside Disposition: Patient declines SNF.  Wants to go home.  Will need home health.  Status is: Inpatient  Remains inpatient appropriate because:Persistent severe electrolyte disturbances   Dispo: The patient is from: SNF              Anticipated d/c is to: Home              Anticipated d/c date is: 3 days              Patient currently is not medically stable to d/c.   Consultants:  Orthopedics Infectious disease Cardiology  Objective: Blood pressure 121/66, pulse 85, temperature 98.1 F (36.7 C), temperature source Oral, resp. rate 20, height 5\' 9"  (1.753 m), weight 50.9 kg, SpO2 100 %.  Intake/Output Summary (Last 24 hours) at 07/17/2020 1227 Last data filed at 07/17/2020 0247 Gross per 24 hour  Intake 381.45 ml  Output 250 ml  Net 131.45 ml   Filed Weights   07/08/20 1130  Weight: 50.9 kg    Examination:  General appearance: Awake alert.  In no distress Resp: Clear to auscultation bilaterally.  Normal effort Cardio: S1-S2 is normal regular.  No S3-S4.  No rubs murmurs or bruit GI: Abdomen is soft.  Nontender nondistended.  Bowel sounds are present normal.  No masses organomegaly Extremities: Right BKA Neurologic: Alert and oriented x3.  No focal neurological deficits.      CBC: Recent Labs  Lab 07/15/20 0535 07/16/20 0334 07/17/20 0250  WBC 11.1* 10.6* 12.4*  HGB 10.3* 9.8*  10.5*  HCT 33.3* 31.5* 33.3*  MCV 91.0 91.0 90.5  PLT 146* 134* 161*   Basic Metabolic Panel: Recent Labs  Lab 07/30/2020 0403 07/11/2020 0403 07/15/20 0535 07/16/20 0334 07/17/20 0250  NA 144   < > 145 144 145  K 5.8*   < > 5.3* 5.3* 5.6*  CL 119*   < > 120* 118* 119*  CO2 17*   < > 15* 17* 18*  GLUCOSE 43*   < > 73 88 76  BUN 50*   < > 52* 54* 57*  CREATININE 2.99*   < > 3.08* 3.02* 3.25*  CALCIUM 8.4*   < > 8.3* 8.2* 8.4*  MG 2.1  --   --   --   --    < > = values in this interval not displayed.   GFR: Estimated Creatinine Clearance: 15 mL/min (A) (by C-G formula based on SCr of 3.25 mg/dL (H)).  Liver Function Tests: Recent Labs  Lab 07/25/2020 0403  AST 36  ALT 28  ALKPHOS 514*  BILITOT 0.5  PROT 5.2*  ALBUMIN 1.2*    HbA1C: Hgb A1c MFr Bld  Date/Time Value Ref Range Status  07/28/2020 04:12 AM 4.2 (L) 4.8 - 5.6 % Final    Comment:    (NOTE) Pre diabetes:          5.7%-6.4%  Diabetes:              >6.4%  Glycemic control for   <7.0% adults with diabetes   05/02/2020 01:06 AM 4.1 (L) 4.8 - 5.6 % Final    Comment:    (NOTE) Pre diabetes:          5.7%-6.4%  Diabetes:              >6.4%  Glycemic control for   <7.0% adults with diabetes     CBG: Recent Labs  Lab 07/16/20 1226 07/16/20 1641 07/16/20 2124 07/17/20 0727 07/17/20 1210  GLUCAP 79 85 78 66* 88    Recent Results (from the past 240 hour(s))  Culture, blood (routine x 2)     Status: None   Collection Time: 07/09/2020  5:00 PM   Specimen: BLOOD  Result Value Ref Range Status   Specimen Description BLOOD LEFT ANTECUBITAL  Final   Special Requests   Final    BOTTLES DRAWN AEROBIC AND ANAEROBIC Blood Culture results may not be optimal due to an inadequate volume of blood received in culture bottles   Culture   Final    NO GROWTH 5 DAYS Performed at [facility], [place] [address] [place], [place] [address]    Report Status 07/12/2020 FINAL  Final  Culture, blood (routine x 2)      Status: None   Collection Time: 07/06/2020  6:06 PM   Specimen: BLOOD  Result Value Ref Range Status   Specimen Description BLOOD LEFT ANTECUBITAL  Final   Special Requests   Final    BOTTLES DRAWN AEROBIC AND ANAEROBIC Blood Culture adequate volume   Culture   Final    NO GROWTH 5 DAYS Performed at [facility], [place] [address] [place], [place] [address]    Report Status 07/12/2020 FINAL  Final  Respiratory Panel by RT PCR (Flu A&B, Covid) - Nasopharyngeal Swab     Status: Abnormal   Collection Time: 07/25/2020  9:00 PM   Specimen: Nasopharyngeal Swab  Result Value Ref Range Status   SARS Coronavirus 2 by RT PCR POSITIVE (A) NEGATIVE Final    Comment: RESULT CALLED TO, READ BACK BY AND VERIFIED WITH: B ROUNDTREE RN 07/25/2020 2217 JDW (NOTE) SARS-CoV-2 target nucleic acids are DETECTED.  SARS-CoV-2 RNA is generally detectable in upper respiratory specimens  during the acute phase of infection. Positive results are indicative of the presence of the identified virus, but do not rule out bacterial infection or co-infection with other pathogens not detected by the test. Clinical correlation with patient history and other diagnostic information is necessary to determine patient infection status. The expected result is Negative.  Fact Sheet for Patients:  PinkCheek.be  Fact Sheet for Healthcare Providers: GravelBags.it  This test is not yet approved or cleared by the [place] FDA and  has been authorized for detection and/or diagnosis of SARS-CoV-2 by FDA under an Emergency Use Authorization (EUA).  This EUA will remain in effect (meaning this test can be use d) for the duration of  the COVID-19 declaration under Section 564(b)(1) of the Act, 21 U.S.C. section 360bbb-3(b)(1), unless the authorization is terminated or revoked sooner.      Influenza A by PCR NEGATIVE NEGATIVE Final   Influenza  B by PCR NEGATIVE  NEGATIVE Final    Comment: (NOTE) The Xpert Xpress SARS-CoV-2/FLU/RSV assay is intended as an aid in  the diagnosis of influenza from Nasopharyngeal swab specimens and  should not be used as a sole basis for treatment. Nasal washings and  aspirates are unacceptable for Xpert Xpress SARS-CoV-2/FLU/RSV  testing.  Fact Sheet for Patients: PinkCheek.be  Fact Sheet for Healthcare Providers: GravelBags.it  This test is not yet approved or cleared by the [place] FDA and  has been authorized for detection and/or diagnosis of SARS-CoV-2 by  FDA under an Emergency Use Authorization (EUA). This EUA will remain  in effect (meaning this test can be used) for the duration of the  Covid-19 declaration under Section 564(b)(1) of the Act, 21  U.S.C. section 360bbb-3(b)(1), unless the authorization is  terminated or revoked. Performed at [facility], [place] [address] [place], [place] [address]   MRSA PCR Screening     Status: None   Collection Time: 07/19/2020  1:10 PM   Specimen: Nasal Mucosa; Nasopharyngeal  Result Value Ref Range Status   MRSA by PCR NEGATIVE NEGATIVE Final    Comment:        The GeneXpert MRSA Assay (FDA approved for NASAL specimens only), is one component of a comprehensive MRSA colonization surveillance program. It is not intended to diagnose MRSA infection nor to guide or monitor treatment for MRSA infections. Performed at [facility], [place] [address] [place], [place] [address]   Aerobic/Anaerobic Culture (surgical/deep wound)     Status: None (Preliminary result)   Collection Time: 08/02/2020  1:46 PM   Specimen: PATH Soft tissue  Result Value Ref Range Status   Specimen Description TISSUE RIGHT LEG  Final   Special Requests DEEP SPEC A  Final   Gram Stain   Final    FEW WBC PRESENT,BOTH PMN AND MONONUCLEAR RARE GRAM POSITIVE COCCI RARE GRAM NEGATIVE RODS    Culture   Final    FEW  KLEBSIELLA PNEUMONIAE FEW ENTEROBACTER CLOACAE FEW PSEUDOMONAS AERUGINOSA HOLDING FOR POSSIBLE ANAEROBE Performed at [facility], [place] [address] [place], [place] [address]    Report Status PENDING  Incomplete   Organism ID, Bacteria KLEBSIELLA PNEUMONIAE  Final   Organism ID, Bacteria ENTEROBACTER CLOACAE  Final   Organism ID, Bacteria PSEUDOMONAS AERUGINOSA  Final      Susceptibility   Enterobacter cloacae - MIC*    CEFAZOLIN >=64 RESISTANT Resistant     CEFEPIME <=0.12 SENSITIVE Sensitive     CEFTAZIDIME <=1 SENSITIVE Sensitive     CIPROFLOXACIN <=0.25 SENSITIVE Sensitive     GENTAMICIN <=1 SENSITIVE Sensitive     IMIPENEM 0.5 SENSITIVE Sensitive     TRIMETH/SULFA <=20 SENSITIVE Sensitive     PIP/TAZO <=4 SENSITIVE Sensitive     * FEW ENTEROBACTER CLOACAE   Klebsiella pneumoniae - MIC*    AMPICILLIN RESISTANT Resistant     CEFAZOLIN <=4 SENSITIVE Sensitive     CEFEPIME <=0.12 SENSITIVE Sensitive     CEFTAZIDIME <=1 SENSITIVE Sensitive     CEFTRIAXONE <=0.25 SENSITIVE Sensitive     CIPROFLOXACIN <=0.25 SENSITIVE Sensitive     GENTAMICIN <=1 SENSITIVE Sensitive     IMIPENEM 0.5 SENSITIVE Sensitive     TRIMETH/SULFA <=20 SENSITIVE Sensitive     AMPICILLIN/SULBACTAM <=2 SENSITIVE Sensitive     PIP/TAZO <=4 SENSITIVE Sensitive     * FEW KLEBSIELLA PNEUMONIAE   Pseudomonas aeruginosa - MIC*    CEFTAZIDIME 4 SENSITIVE  Sensitive     CIPROFLOXACIN <=0.25 SENSITIVE Sensitive     GENTAMICIN <=1 SENSITIVE Sensitive     IMIPENEM 1 SENSITIVE Sensitive     PIP/TAZO 8 SENSITIVE Sensitive     CEFEPIME 2 SENSITIVE Sensitive     * FEW PSEUDOMONAS AERUGINOSA     Scheduled Meds: . amiodarone  200 mg Oral Daily  . apixaban  2.5 mg Oral BID  . ascorbic acid  250 mg Oral BID  . cholecalciferol  1,000 Units Oral Daily  . docusate sodium  100 mg Oral BID  . feeding supplement (NEPRO CARB STEADY)  237 mL Oral TID BM  . folic acid  1 mg Oral Daily  . hydrocortisone  25 mg Rectal BID   . mirtazapine  7.5 mg Oral QHS  . multivitamin with minerals  1 tablet Oral Daily  . nortriptyline  25 mg Oral QHS  . pantoprazole  40 mg Oral Daily  . predniSONE  5 mg Oral Daily  . ramelteon  8 mg Oral QHS  . senna  1 tablet Oral BID  . senna-docusate  1 tablet Oral BID  . sodium bicarbonate  650 mg Oral TID  . sodium zirconium cyclosilicate  10 g Oral BID  . tacrolimus  1.5 mg Oral Q12H  . thiamine  100 mg Oral Daily  . zinc sulfate  220 mg Oral Daily   Continuous Infusions: . sodium chloride Stopped (07/15/20 1620)  . ceFEPime (MAXIPIME) IV 2 g (07/16/20 1715)     LOS: 9 days   [facility] Office  [phone] Pager - Text Page per Shea Evans  If 7PM-7AM, please contact night-coverage per Amion 07/17/2020, 12:27 PM

## 2020-07-17 NOTE — Progress Notes (Signed)
Gave patient 4oz grape juice for blood sugar of 66.

## 2020-07-17 NOTE — Progress Notes (Signed)
Subjective: 3 Days Post-Op Procedure(s) (LRB): Revision right below knee amputation (Right)  Patient reports pain as mild to moderate.  Denies fever, chills, N/V, CP, SOB.  Reports that she is tolerating POs.  Admits to flatus.  Objective:   VITALS:  Temp:  [97.7 F (36.5 C)-98.7 F (37.1 C)] 97.8 F (36.6 C) (10/15 0726) Pulse Rate:  [82-89] 88 (10/15 0727) Resp:  [16-19] 18 (10/15 0726) BP: (112-132)/(43-69) 128/69 (10/15 0726) SpO2:  [99 %-100 %] 100 % (10/15 0727)  General: WDWN patient in NAD. Psych:  Appropriate mood and affect. Neuro:  A&O x 3, Moving all extremities, sensation intact to light touch HEENT:  EOMs intact Chest:  Even non-labored respirations Skin: Dressing C/D/I, no rashes or lesions Extremities: warm/dry, no visible edema, erythema or echymosis.  No lymphadenopathy. Pulses: Femoral 2+ MSK:  ROM: TKE, MMT: able to perform quad set    LABS Recent Labs    07/15/20 0535 07/15/20 0535 07/16/20 0334 07/17/20 0250  HGB 10.3*  --  9.8* 10.5*  WBC 11.1*   < > 10.6* 12.4*  PLT 146*   < > 134* 146*   < > = values in this interval not displayed.   Recent Labs    07/16/20 0334 07/17/20 0250  NA 144 145  K 5.3* 5.6*  CL 118* 119*  CO2 17* 18*  BUN 54* 57*  CREATININE 3.02* 3.25*  GLUCOSE 88 76   No results for input(s): LABPT, INR in the last 72 hours.   Assessment/Plan: 3 Days Post-Op Procedure(s) (LRB): Revision right below knee amputation (Right)  NWB R LE Up with therapy Disp:  PT recommends SNF ABX per ID Plan for 2 week outpatient post-op visit with Dr. Milus Height PA-C [facility] Office:  [phone]

## 2020-07-18 DIAGNOSIS — T148XXA Other injury of unspecified body region, initial encounter: Secondary | ICD-10-CM

## 2020-07-18 DIAGNOSIS — I4891 Unspecified atrial fibrillation: Secondary | ICD-10-CM | POA: Diagnosis not present

## 2020-07-18 DIAGNOSIS — N179 Acute kidney failure, unspecified: Secondary | ICD-10-CM | POA: Diagnosis not present

## 2020-07-18 DIAGNOSIS — L089 Local infection of the skin and subcutaneous tissue, unspecified: Secondary | ICD-10-CM

## 2020-07-18 DIAGNOSIS — A498 Other bacterial infections of unspecified site: Secondary | ICD-10-CM

## 2020-07-18 DIAGNOSIS — E875 Hyperkalemia: Secondary | ICD-10-CM | POA: Diagnosis not present

## 2020-07-18 DIAGNOSIS — E872 Acidosis: Secondary | ICD-10-CM | POA: Diagnosis not present

## 2020-07-18 LAB — CBC
HCT: 30.3 % — ABNORMAL LOW (ref 36.0–46.0)
Hemoglobin: 9.7 g/dL — ABNORMAL LOW (ref 12.0–15.0)
MCH: 29.6 pg (ref 26.0–34.0)
MCHC: 32 g/dL (ref 30.0–36.0)
MCV: 92.4 fL (ref 80.0–100.0)
Platelets: 150 10*3/uL (ref 150–400)
RBC: 3.28 MIL/uL — ABNORMAL LOW (ref 3.87–5.11)
RDW: 21 % — ABNORMAL HIGH (ref 11.5–15.5)
WBC: 11.9 10*3/uL — ABNORMAL HIGH (ref 4.0–10.5)
nRBC: 0 % (ref 0.0–0.2)

## 2020-07-18 LAB — BASIC METABOLIC PANEL
Anion gap: 10 (ref 5–15)
BUN: 60 mg/dL — ABNORMAL HIGH (ref 6–20)
CO2: 16 mmol/L — ABNORMAL LOW (ref 22–32)
Calcium: 8.1 mg/dL — ABNORMAL LOW (ref 8.9–10.3)
Chloride: 118 mmol/L — ABNORMAL HIGH (ref 98–111)
Creatinine, Ser: 3.21 mg/dL — ABNORMAL HIGH (ref 0.44–1.00)
GFR, Estimated: 15 mL/min — ABNORMAL LOW (ref >60)
Glucose, Bld: 139 mg/dL — ABNORMAL HIGH (ref 70–99)
Potassium: 5.6 mmol/L — ABNORMAL HIGH (ref 3.5–5.1)
Sodium: 144 mmol/L (ref 135–145)

## 2020-07-18 LAB — GLUCOSE, CAPILLARY
Glucose-Capillary: 92 mg/dL (ref 70–99)
Glucose-Capillary: 97 mg/dL (ref 70–99)
Glucose-Capillary: 105 mg/dL — ABNORMAL HIGH (ref 70–99)
Glucose-Capillary: 93 mg/dL (ref 70–99)

## 2020-07-18 MED ORDER — CIPROFLOXACIN HCL 500 MG PO TABS
500.0000 mg | ORAL_TABLET | Freq: Every day | ORAL | Status: AC
  Administered 2020-07-18 – 2020-07-26 (×9): 500 mg via ORAL
  Filled 2020-07-18 (×9): qty 1

## 2020-07-18 NOTE — Progress Notes (Signed)
[facility] will sign off.  The consult by Dr. Angelena Form was reviewed.  It appears that no further recommendations/procedures are indicated from our standpoint. Medication Recommendations: None. Other recommendations (labs, testing, etc): None Follow up as an outpatient: As needed team at [facility]

## 2020-07-18 NOTE — Progress Notes (Signed)
Admit: 07/26/2020 LOS: 10  5F s/p KT 2020 with CKD4T Tac/Pred; admitted with dehisced and infected recent right BKA now on broad-spectrum antibiotics; persistent hyperkalemia and metabolic acidosis.  Subjective:  . NAE . More awake/alert this AM  . Creatinine stable, K5.6, bicarbonate 16 . UOP not fully quantified  10/15 0701 - 10/16 0700 In: 1174.3 [P.O.:600; I.V.:137.3; NG/GT:237; IV Piggyback:200] Out: 150 [Urine:150]  Filed Weights   07/08/20 1130  Weight: 50.9 kg    Scheduled Meds: . amiodarone  200 mg Oral Daily  . apixaban  2.5 mg Oral BID  . ascorbic acid  250 mg Oral BID  . cholecalciferol  1,000 Units Oral Daily  . ciprofloxacin  500 mg Oral Daily  . docusate sodium  100 mg Oral BID  . feeding supplement (NEPRO CARB STEADY)  237 mL Oral TID BM  . folic acid  1 mg Oral Daily  . hydrocortisone  25 mg Rectal BID  . meclizine  12.5 mg Oral TID  . metroNIDAZOLE  500 mg Oral Q8H  . mirtazapine  7.5 mg Oral QHS  . multivitamin with minerals  1 tablet Oral Daily  . nortriptyline  25 mg Oral QHS  . pantoprazole  40 mg Oral Daily  . predniSONE  5 mg Oral Daily  . ramelteon  8 mg Oral QHS  . senna  1 tablet Oral BID  . senna-docusate  1 tablet Oral BID  . sodium bicarbonate  1,300 mg Oral TID  . sodium zirconium cyclosilicate  10 g Oral BID  . tacrolimus  1.5 mg Oral Q12H  . thiamine  100 mg Oral Daily  . zinc sulfate  220 mg Oral Daily   Continuous Infusions: . sodium chloride Stopped (07/15/20 1620)   PRN Meds:.acetaminophen, alum & mag hydroxide-simeth, bisacodyl, dextrose, dicyclomine, diphenhydrAMINE, diphenhydrAMINE, morphine injection, naphazoline-glycerin, ondansetron **OR** ondansetron (ZOFRAN) IV, oxyCODONE, polyethylene glycol, traMADol  Current Labs: reviewed   Physical Exam:  Blood pressure 118/61, pulse 91, temperature 98 F (36.7 C), temperature source Oral, resp. rate 18, height 5\' 9"  (1.753 m), weight 50.9 kg, SpO2 100 %. GEN: chronically ill  appearing, sleeping ENT: temporal wasting EYES: EOMI CV: Regular, nl s1s2 PULM: CTAB ABD: soft SKIN: RLE bandagd EXT:No sig LLE edema.  Unna boot  A 1. CKD 4T: Probably at her baseline, I am not sure there is an acute component.  Continue current transplant regimen. 2. 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 Chronic metabolic acidosis, from type IV RTA, increased sodium bicarbonate to 1300 mg 3 times weekly, CTM 4. Infected and dehisced right leg BKA status post revision and now on cefepime and metronidazole, RCID following, per them, ortho, and primary 5. DM2 6. AFib 7. Anemia, Hb stable 8. HTN, BP stable  P . Cont supportive care, PT/OT . Trend K and HCO3  . Will follow along . Medication Issues; o Preferred narcotic agents for pain control are hydromorphone, fentanyl, and methadone. Morphine should not be used.  o Baclofen should be avoided o Avoid oral sodium phosphate and magnesium citrate based laxatives / bowel preps    Pearson Grippe MD 07/18/2020, 11:34 AM  Recent Labs  Lab 07/16/20 0334 07/17/20 0250 07/18/20 0221  NA 144 145 144  K 5.3* 5.6* 5.6*  CL 118* 119* 118*  CO2 17* 18* 16*  GLUCOSE 88 76 139*  BUN 54* 57* 60*  CREATININE 3.02* 3.25* 3.21*  CALCIUM 8.2* 8.4* 8.1*   Recent Labs  Lab 07/16/20 0334 07/17/20 0250 07/18/20 0221  WBC 10.6* 12.4* 11.9*  HGB 9.8* 10.5* 9.7*  HCT 31.5* 33.3* 30.3*  MCV 91.0 90.5 92.4  PLT 134* 146* 150

## 2020-07-18 NOTE — Progress Notes (Addendum)
Wanda [name]  EQA:[id number] DOB: 10/29/1960 DOA: 07/12/2020 PCP: Lorene Dy, MD    Brief Narrative:  [phone] with a history of PAF on Eliquis, CKD stage IV status post renal transplant, anemia of CKD, gastric sleeve, hypoalbuminemia, DM2, HTN, hypothyroidism, GERD, PAD, and chronic neuropathy who was admitted to the hospital 05/01/2020-06/01/2020 in septic shock secondary to a R foot ulcer with Klebsiella bacteremia complicated by endocarditis.  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  She returned to the ED 10/5 with a 1 week history of drainage and dehiscence of the stump site.  In the ED the patient was found to have an elevated WBC.  X-ray right tibia/fibula was without evidence of osseous abnormality.  She was found to be hyperkalemic at 6.0 and in acute renal failure with creatinine 3.4 up from baseline of 2.4.    Significant Events:  7/30 > 8/30 admit right foot cellulitis and Klebsiella bacteremia > R BKA 10/5 admit w/ AKI, hyperkalemia, and wound dehiscence  10/12 to OR for wound revision   Antimicrobials:  none  DVT prophylaxis: Eliquis  Subjective: Patient mentions that her pain in the right lower extremity is better.  Well controlled with pain medications.  Some nausea overnight but no vomiting.  Otherwise feeling well.  Looking forward to going home soon.   Assessment & Plan:  R BKA stump site dehiscence/wound infection/questionable history of endocarditis X-ray without evidence to suggest osteomyelitis. She finished a prolonged course of IV meropenem 9/25.  Blood cultures without any growth so far. Ortho performed irrigation and excisional debridement of right leg wound w/ intermediate closure 10/12. She will be nonweightbearing on the right lower extremity. Gram stain of the deep tissue cultures show gram-negative rods along with GPC.  Wound cultures grew out Klebsiella.  Infectious  disease was consulted due to patient's previous history.   Patient started on cefepime.  Patient underwent echocardiogram which showed density on the mitral valve.  Cardiology was consulted.  They did not feel that this is a vegetation.  They say that this likely represents a segment of redundant mitral subvalvular patterns.  They did not recommend a TEE at this time.  ID recommended transitioning patient from cefepime to oral Cipro.  Oral Flagyl was added yesterday.   Plan is for a total of 10-day treatment with antibiotics.   Vertigo Over the last 24 hours patient has experienced dizziness which she describes as if the room is spinning around her.  This mainly occurs with movement whenever she is turned.  She does report some ear issues.  Presentation consistent with the vertigo.  No focal neurological deficits noted.  Patient was started on meclizine.  Seems to be stable.  Recent COVID Diagnosed with Covid 9/10 while at [facility] and quarantined for 14 days. Last day of isolation was 10/1. CXR notes resolved bilateral lung opacities compared to previous exam Patient has been asymptomatic.  Acute kidney injury on CKD stage IV status post renal transplant/metabolic acidosis Baseline creatinine 2.1-2.4 Creatinine 3.4 at presentation.  It appears that this was discussed with nephrology who suggested volume resuscitation which was provided.   Some worsening in her creatinine noted over the last several days.  Nephrology was consulted.  They did not feel that this is a significant deviation from her baseline.  They are also satisfied with her potassium less than 6.  Patient is stable otherwise.  She  has been urinating.  Sodium bicarbonate dose was increased yesterday.   She is followed by Dr. Posey Pronto at [facility].  Noted to be on tacrolimus and prednisone.  Hyperkalemia Nephrology satisfied with her potassium level less than 6.  Continue Lokelma.  Patient noted to be on  Veltassa at home.    Hypotension Likely due to volume depletion. Resolved with ongoing volume resuscitation. Random serum cortisol not low  Paroxysmal atrial fibrillation Continue Eliquis and amiodarone.  Currently in sinus rhythm.  DM 2 - controlled but with chronic neuropathy Does not require home medical therapy.  Occasional low glucose levels noted.  Likely due to poor oral intake.  Not noted to be on any glucose lowering agents here in the hospital.  Hypothyroidism Reported history of such but is not on thyroid hormone replacement - TSH signif elevated at 23.73 - free T4 actually normal at 0.8 - cont w/o supplementation for now - suggest recheck of TSH in 6-8 weeks   Normocytic anemia Likely due to chronic disease.  No evidence of overt bleeding.    Severe malnutrition in context of chronic illness Nutrition consulted  DVT prophylaxis: Eliquis Code Status: FULL CODE Family Communication: Discussed with the patient.  No family at bedside Disposition: Patient declines SNF.  Wants to go home.  Will need home health.  Status is: Inpatient  Remains inpatient appropriate because:Persistent severe electrolyte disturbances   Dispo: The patient is from: SNF              Anticipated d/c is to: Home              Anticipated d/c date is: 10/17              Patient currently is not medically stable to d/c.   Consultants:  Orthopedics Infectious disease Cardiology  Objective: Blood pressure 118/61, pulse 91, temperature 98 F (36.7 C), temperature source Oral, resp. rate 18, height 5\' 9"  (1.753 m), weight 50.9 kg, SpO2 100 %.  Intake/Output Summary (Last 24 hours) at 07/18/2020 0924 Last data filed at 07/17/2020 1600 Gross per 24 hour  Intake 1174.29 ml  Output 150 ml  Net 1024.29 ml   Filed Weights   07/08/20 1130  Weight: 50.9 kg    Examination:  General appearance: Awake alert.  In no distress Resp: Clear to auscultation bilaterally.  Normal effort Cardio: S1-S2  is normal regular.  No S3-S4.  No rubs murmurs or bruit GI: Abdomen is soft.  Nontender nondistended.  Bowel sounds are present normal.  No masses organomegaly Extremities: Right BKA covered with dressing Neurologic: Alert and oriented x3.  No focal neurological deficits.      CBC: Recent Labs  Lab 07/16/20 0334 07/17/20 0250 07/18/20 0221  WBC 10.6* 12.4* 11.9*  HGB 9.8* 10.5* 9.7*  HCT 31.5* 33.3* 30.3*  MCV 91.0 90.5 92.4  PLT 134* 146* 401   Basic Metabolic Panel: Recent Labs  Lab 07/12/2020 0403 07/15/20 0535 07/16/20 0334 07/17/20 0250 07/18/20 0221  NA 144   < > 144 145 144  K 5.8*   < > 5.3* 5.6* 5.6*  CL 119*   < > 118* 119* 118*  CO2 17*   < > 17* 18* 16*  GLUCOSE 43*   < > 88 76 139*  BUN 50*   < > 54* 57* 60*  CREATININE 2.99*   < > 3.02* 3.25* 3.21*  CALCIUM 8.4*   < > 8.2* 8.4* 8.1*  MG 2.1  --   --   --   --    < > =  values in this interval not displayed.   GFR: Estimated Creatinine Clearance: 15.2 mL/min (A) (by C-G formula based on SCr of 3.21 mg/dL (H)).  Liver Function Tests: Recent Labs  Lab 07/11/2020 0403  AST 36  ALT 28  ALKPHOS 514*  BILITOT 0.5  PROT 5.2*  ALBUMIN 1.2*    HbA1C: Hgb A1c MFr Bld  Date/Time Value Ref Range Status  07/18/2020 04:12 AM 4.2 (L) 4.8 - 5.6 % Final    Comment:    (NOTE) Pre diabetes:          5.7%-6.4%  Diabetes:              >6.4%  Glycemic control for   <7.0% adults with diabetes   05/02/2020 01:06 AM 4.1 (L) 4.8 - 5.6 % Final    Comment:    (NOTE) Pre diabetes:          5.7%-6.4%  Diabetes:              >6.4%  Glycemic control for   <7.0% adults with diabetes     CBG: Recent Labs  Lab 07/17/20 0727 07/17/20 1210 07/17/20 1610 07/17/20 2116 07/18/20 0806  GLUCAP 66* 88 113* 114* 92    Recent Results (from the past 240 hour(s))  MRSA PCR Screening     Status: None   Collection Time: 07/23/2020  1:10 PM   Specimen: Nasal Mucosa; Nasopharyngeal  Result Value Ref Range Status    MRSA by PCR NEGATIVE NEGATIVE Final    Comment:        The GeneXpert MRSA Assay (FDA approved for NASAL specimens only), is one component of a comprehensive MRSA colonization surveillance program. It is not intended to diagnose MRSA infection nor to guide or monitor treatment for MRSA infections. Performed at [facility], [place] [address], [place], [place] [address]   Aerobic/Anaerobic Culture (surgical/deep wound)     Status: None   Collection Time: 07/05/2020  1:46 PM   Specimen: PATH Soft tissue  Result Value Ref Range Status   Specimen Description TISSUE RIGHT LEG  Final   Special Requests DEEP SPEC A  Final   Gram Stain   Final    FEW WBC PRESENT,BOTH PMN AND MONONUCLEAR RARE GRAM POSITIVE COCCI RARE GRAM NEGATIVE RODS Performed at [facility], [place] [address], [place],  [address]    Culture   Final    FEW KLEBSIELLA PNEUMONIAE FEW ENTEROBACTER CLOACAE FEW PSEUDOMONAS AERUGINOSA MODERATE BACTEROIDES FRAGILIS    Report Status 07/17/2020 FINAL  Final   Organism ID, Bacteria KLEBSIELLA PNEUMONIAE  Final   Organism ID, Bacteria ENTEROBACTER CLOACAE  Final   Organism ID, Bacteria PSEUDOMONAS AERUGINOSA  Final      Susceptibility   Enterobacter cloacae - MIC*    CEFAZOLIN >=64 RESISTANT Resistant     CEFEPIME <=0.12 SENSITIVE Sensitive     CEFTAZIDIME <=1 SENSITIVE Sensitive     CIPROFLOXACIN <=0.25 SENSITIVE Sensitive     GENTAMICIN <=1 SENSITIVE Sensitive     IMIPENEM 0.5 SENSITIVE Sensitive     TRIMETH/SULFA <=20 SENSITIVE Sensitive     PIP/TAZO <=4 SENSITIVE Sensitive     * FEW ENTEROBACTER CLOACAE   Klebsiella pneumoniae - MIC*    AMPICILLIN RESISTANT Resistant     CEFAZOLIN <=4 SENSITIVE Sensitive     CEFEPIME <=0.12 SENSITIVE Sensitive     CEFTAZIDIME <=1 SENSITIVE Sensitive     CEFTRIAXONE <=0.25 SENSITIVE Sensitive     CIPROFLOXACIN <=0.25 SENSITIVE Sensitive  GENTAMICIN <=1 SENSITIVE Sensitive     IMIPENEM 0.5 SENSITIVE Sensitive      TRIMETH/SULFA <=20 SENSITIVE Sensitive     AMPICILLIN/SULBACTAM <=2 SENSITIVE Sensitive     PIP/TAZO <=4 SENSITIVE Sensitive     * FEW KLEBSIELLA PNEUMONIAE   Pseudomonas aeruginosa - MIC*    CEFTAZIDIME 4 SENSITIVE Sensitive     CIPROFLOXACIN <=0.25 SENSITIVE Sensitive     GENTAMICIN <=1 SENSITIVE Sensitive     IMIPENEM 1 SENSITIVE Sensitive     PIP/TAZO 8 SENSITIVE Sensitive     CEFEPIME 2 SENSITIVE Sensitive     * FEW PSEUDOMONAS AERUGINOSA     Scheduled Meds: . amiodarone  200 mg Oral Daily  . apixaban  2.5 mg Oral BID  . ascorbic acid  250 mg Oral BID  . cholecalciferol  1,000 Units Oral Daily  . docusate sodium  100 mg Oral BID  . feeding supplement (NEPRO CARB STEADY)  237 mL Oral TID BM  . folic acid  1 mg Oral Daily  . hydrocortisone  25 mg Rectal BID  . meclizine  12.5 mg Oral TID  . metroNIDAZOLE  500 mg Oral Q8H  . mirtazapine  7.5 mg Oral QHS  . multivitamin with minerals  1 tablet Oral Daily  . nortriptyline  25 mg Oral QHS  . pantoprazole  40 mg Oral Daily  . predniSONE  5 mg Oral Daily  . ramelteon  8 mg Oral QHS  . senna  1 tablet Oral BID  . senna-docusate  1 tablet Oral BID  . sodium bicarbonate  1,300 mg Oral TID  . sodium zirconium cyclosilicate  10 g Oral BID  . tacrolimus  1.5 mg Oral Q12H  . thiamine  100 mg Oral Daily  . zinc sulfate  220 mg Oral Daily   Continuous Infusions: . sodium chloride Stopped (07/15/20 1620)     LOS: 10 days   [facility] Office  [phone] Pager - Text Page per Shea Evans  If 7PM-7AM, please contact night-coverage per Amion 07/18/2020, 9:24 AM

## 2020-07-18 NOTE — Progress Notes (Signed)
Pharmacy Antibiotic Note  Wanda [name] is a 59 y.o. female admitted on 07/19/2020 with wound infection.  Pharmacy has been consulted for Cipro dosing.  Of note, PMH includes CKD stage IV s/p renal transplant, and admission 7/30-8/30/2021 for septic shock secondary to a R foot ulcer with Klebsiella bacteremia complicated by endocarditis. 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  She returned to the ED 10/5 with a 1 week history of drainage and dehiscence of the stump site. Had I & D on 10/12 AKI- SCr 3.21 (BL ~2.4).  **Cipro has a major interaction with tacrolimus. Pharmacologic effects and serum concentrations of tacrolimus may be increased by ciprofloxacin (weak CYP3A4 inhibitors).   Plan: Cipro 500 mg PO every 24 hours Metronidazole per MD Monitor clinical progress, cultures/sensitivities, renal function, abx plan Monitor tacrolimus levels while on Cipro   Height: 5\' 9"  (175.3 cm) (Pt stated) Weight: 50.9 kg (112 lb 3.4 oz) IBW/kg (Calculated) : 66.2  Temp (24hrs), Avg:98.1 F (36.7 C), Min:98 F (36.7 C), Max:98.2 F (36.8 C)  Recent Labs  Lab 07/13/2020 0403 07/15/20 0535 07/16/20 0334 07/17/20 0250 07/18/20 0221  WBC 9.4 11.1* 10.6* 12.4* 11.9*  CREATININE 2.99* 3.08* 3.02* 3.25* 3.21*    Estimated Creatinine Clearance: 15.2 mL/min (A) (by C-G formula based on SCr of 3.21 mg/dL (H)).    Allergies  Allergen Reactions  . Propoxyphene N-Acetaminophen Other (See Comments)    Severe headache  . Hydrocodone-Acetaminophen Other (See Comments)     Severe headache   . Astemizole Rash and Other (See Comments)    headache   . Cephalosporins Rash    Antimicrobials this admission: 10/12 Ancef x 1 (periop) 10/14 Cefepime >> 10/15 10/15 Flagyl >> 10/16 Cipro >>  Dose adjustments this admission:  Microbiology results: 10/12 wound culture: Kleb pneumo, enterobacter cloacae, pseudomonas  10/5 BCx: ngf 10/5  RVP: COVID +  10/7 MRSA PCR: neg   Thank you for allowing [place] to participate in this patients care.   Jens Som, PharmD Please see amion for complete clinical pharmacist phone list. 07/18/2020 10:12 AM

## 2020-07-18 NOTE — Progress Notes (Signed)
PT Cancellation Note  Patient Details Name: Wanda [name] MRN: [id number] DOB: 1961-07-12   Cancelled Treatment:    Reason Eval/Treat Not Completed: Patient declined, no reason specified Was consulted to evaluate 2/2 vertigo, questionable BPPV. Patient very nauseous but reports 60% improvement in vertigo from yesterday. The nausea today she believes is from taking multiple medications at once. Requests we follow-up for vertigo another time when she is less nauseous and not worried about vomiting.   [name] 07/18/2020, 4:02 PM   Elayne Snare, PT, DPT

## 2020-07-19 ENCOUNTER — Inpatient Hospital Stay (HOSPITAL_COMMUNITY): Payer: Medicare HMO

## 2020-07-19 DIAGNOSIS — N179 Acute kidney failure, unspecified: Secondary | ICD-10-CM | POA: Diagnosis not present

## 2020-07-19 DIAGNOSIS — E875 Hyperkalemia: Secondary | ICD-10-CM | POA: Diagnosis not present

## 2020-07-19 DIAGNOSIS — T148XXA Other injury of unspecified body region, initial encounter: Secondary | ICD-10-CM | POA: Diagnosis not present

## 2020-07-19 DIAGNOSIS — R112 Nausea with vomiting, unspecified: Secondary | ICD-10-CM | POA: Diagnosis not present

## 2020-07-19 LAB — HEPATIC FUNCTION PANEL
ALT: 6 U/L (ref 0–44)
AST: 27 U/L (ref 15–41)
Albumin: 1.2 g/dL — ABNORMAL LOW (ref 3.5–5.0)
Alkaline Phosphatase: 414 U/L — ABNORMAL HIGH (ref 38–126)
Bilirubin, Direct: 0.2 mg/dL (ref 0.0–0.2)
Indirect Bilirubin: 0.4 mg/dL (ref 0.3–0.9)
Total Bilirubin: 0.6 mg/dL (ref 0.3–1.2)
Total Protein: 5.4 g/dL — ABNORMAL LOW (ref 6.5–8.1)

## 2020-07-19 LAB — BASIC METABOLIC PANEL
Anion gap: 9 (ref 5–15)
BUN: 62 mg/dL — ABNORMAL HIGH (ref 6–20)
CO2: 18 mmol/L — ABNORMAL LOW (ref 22–32)
Calcium: 8.4 mg/dL — ABNORMAL LOW (ref 8.9–10.3)
Chloride: 118 mmol/L — ABNORMAL HIGH (ref 98–111)
Creatinine, Ser: 3.36 mg/dL — ABNORMAL HIGH (ref 0.44–1.00)
GFR, Estimated: 14 mL/min — ABNORMAL LOW (ref >60)
Glucose, Bld: 79 mg/dL (ref 70–99)
Potassium: 5.4 mmol/L — ABNORMAL HIGH (ref 3.5–5.1)
Sodium: 145 mmol/L (ref 135–145)

## 2020-07-19 LAB — CBC
HCT: 33 % — ABNORMAL LOW (ref 36.0–46.0)
Hemoglobin: 10.3 g/dL — ABNORMAL LOW (ref 12.0–15.0)
MCH: 29 pg (ref 26.0–34.0)
MCHC: 31.2 g/dL (ref 30.0–36.0)
MCV: 93 fL (ref 80.0–100.0)
Platelets: 165 10*3/uL (ref 150–400)
RBC: 3.55 MIL/uL — ABNORMAL LOW (ref 3.87–5.11)
RDW: 20.9 % — ABNORMAL HIGH (ref 11.5–15.5)
WBC: 11.3 10*3/uL — ABNORMAL HIGH (ref 4.0–10.5)
nRBC: 0 % (ref 0.0–0.2)

## 2020-07-19 LAB — GLUCOSE, CAPILLARY
Glucose-Capillary: 85 mg/dL (ref 70–99)
Glucose-Capillary: 85 mg/dL (ref 70–99)

## 2020-07-19 LAB — TACROLIMUS LEVEL: Tacrolimus (FK506) - LabCorp: 7.3 ng/mL (ref 2.0–20.0)

## 2020-07-19 MED ORDER — POLYETHYLENE GLYCOL 3350 17 G PO PACK
17.0000 g | PACK | Freq: Two times a day (BID) | ORAL | Status: DC
  Administered 2020-07-19 – 2020-07-22 (×8): 17 g via ORAL
  Filled 2020-07-19 (×9): qty 1

## 2020-07-19 MED ORDER — SODIUM CHLORIDE 0.45 % IV SOLN: INTRAVENOUS | Status: AC

## 2020-07-19 MED ORDER — BISACODYL 10 MG RE SUPP
10.0000 mg | Freq: Once | RECTAL | Status: AC
  Administered 2020-07-19: 10 mg via RECTAL
  Filled 2020-07-19: qty 1

## 2020-07-19 MED ORDER — DOCUSATE SODIUM 283 MG RE ENEM
1.0000 | ENEMA | RECTAL | Status: DC | PRN
  Administered 2020-07-19: 283 mg via RECTAL
  Filled 2020-07-19 (×2): qty 1

## 2020-07-19 MED ORDER — FLEET ENEMA 7-19 GM/118ML RE ENEM: 1.0000 | ENEMA | Freq: Every day | RECTAL | Status: DC | PRN

## 2020-07-19 NOTE — Progress Notes (Signed)
Admit: 07/18/2020 LOS: 49  49F s/p KT 2020 with CKD4T Tac/Pred; admitted with dehisced and infected recent right BKA now on broad-spectrum antibiotics; persistent hyperkalemia and metabolic acidosis.  Subjective:  . Complaint of abdominal pain this morning . On maintenance IV fluids . Creatinine slightly worsened to 3.36, K5.4, HCO3 18 . 0.4 L urine output reported  10/16 0701 - 10/17 0700 In: 954 [P.O.:717; NG/GT:237] Out: 400 [Urine:400]  Filed Weights   07/08/20 1130  Weight: 50.9 kg    Scheduled Meds: . amiodarone  200 mg Oral Daily  . apixaban  2.5 mg Oral BID  . ascorbic acid  250 mg Oral BID  . cholecalciferol  1,000 Units Oral Daily  . ciprofloxacin  500 mg Oral Daily  . docusate sodium  100 mg Oral BID  . feeding supplement (NEPRO CARB STEADY)  237 mL Oral TID BM  . folic acid  1 mg Oral Daily  . hydrocortisone  25 mg Rectal BID  . meclizine  12.5 mg Oral TID  . metroNIDAZOLE  500 mg Oral Q8H  . mirtazapine  7.5 mg Oral QHS  . multivitamin with minerals  1 tablet Oral Daily  . nortriptyline  25 mg Oral QHS  . pantoprazole  40 mg Oral Daily  . predniSONE  5 mg Oral Daily  . ramelteon  8 mg Oral QHS  . senna  1 tablet Oral BID  . senna-docusate  1 tablet Oral BID  . sodium bicarbonate  1,300 mg Oral TID  . sodium zirconium cyclosilicate  10 g Oral BID  . tacrolimus  1.5 mg Oral Q12H  . thiamine  100 mg Oral Daily  . zinc sulfate  220 mg Oral Daily   Continuous Infusions: . sodium chloride 50 mL/hr at 07/19/20 0959  . sodium chloride Stopped (07/15/20 1620)   PRN Meds:.acetaminophen, alum & mag hydroxide-simeth, bisacodyl, dextrose, dicyclomine, diphenhydrAMINE, diphenhydrAMINE, morphine injection, naphazoline-glycerin, ondansetron **OR** ondansetron (ZOFRAN) IV, oxyCODONE, polyethylene glycol, traMADol  Current Labs: reviewed   Physical Exam:  Blood pressure 91/71, pulse 95, temperature 98.5 F (36.9 C), temperature source Oral, resp. rate 16, height 5\' 9"   (1.753 m), weight 50.9 kg, SpO2 93 %. GEN: chronically ill appearing, sleeping ENT: temporal wasting EYES: EOMI CV: Regular, nl s1s2 PULM: CTAB ABD: soft SKIN: RLE bandagd EXT:No sig LLE edema.  Unna boot  A 1. CKD 4T: Probably at her baseline, I am not sure there is an acute component.  Continue current transplant regimen. 2. 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 Chronic metabolic acidosis, from type IV RTA, increased sodium bicarbonate to 1300 mg 3 times weekly, CTM 4. 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 DM2 6. AFib 7. Anemia, Hb stable 8. HTN, BP stable  P . Cont supportive care, PT/OT . Trend K and HCO3  . Continue outpatient immunosuppression of tacrolimus and prednisone . Will follow along . Medication Issues; o Preferred narcotic agents for pain control are hydromorphone, fentanyl, and methadone. Morphine should not be used.  o Baclofen should be avoided o Avoid oral sodium phosphate and magnesium citrate based laxatives / bowel preps    Pearson Grippe MD 07/19/2020, 11:17 AM  Recent Labs  Lab 07/17/20 0250 07/18/20 0221 07/19/20 0059  NA 145 144 145  K 5.6* 5.6* 5.4*  CL 119* 118* 118*  CO2 18* 16* 18*  GLUCOSE 76 139* 79  BUN 57* 60* 62*  CREATININE 3.25* 3.21* 3.36*  CALCIUM 8.4* 8.1* 8.4*   Recent Labs  Lab 07/17/20 0250 07/18/20 0221 07/19/20 0059  WBC 12.4* 11.9* 11.3*  HGB 10.5* 9.7* 10.3*  HCT 33.3* 30.3* 33.0*  MCV 90.5 92.4 93.0  PLT 146* 150 165

## 2020-07-19 NOTE — Progress Notes (Signed)
Wanda [name]  OAC:[id number] DOB: 01-Oct-1961 DOA: 07/12/2020 PCP: Lorene Dy, MD    Brief Narrative:  [phone] with a history of PAF on Eliquis, CKD stage IV status post renal transplant, anemia of CKD, gastric sleeve, hypoalbuminemia, DM2, HTN, hypothyroidism, GERD, PAD, and chronic neuropathy who was admitted to the hospital 05/01/2020-06/01/2020 in septic shock secondary to a R foot ulcer with Klebsiella bacteremia complicated by endocarditis.  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  She returned to the ED 10/5 with a 1 week history of drainage and dehiscence of the stump site.  In the ED the patient was found to have an elevated WBC.  X-ray right tibia/fibula was without evidence of osseous abnormality.  She was found to be hyperkalemic at 6.0 and in acute renal failure with creatinine 3.4 up from baseline of 2.4.    Significant Events:  7/30 > 8/30 admit right foot cellulitis and Klebsiella bacteremia > R BKA 10/5 admit w/ AKI, hyperkalemia, and wound dehiscence  10/12 to OR for wound revision   Antimicrobials:  none  DVT prophylaxis: Eliquis  Subjective: Patient mentions that she had a very rough day yesterday.  She had one episode of vomiting and had nausea pretty much all day yesterday.  She complains of pain in the lower abdomen.  Has not had a bowel movement in several days.  She is passing gas from below.  Complains of itching in the GU area along with burning sensation with urinating.   Assessment & Plan:  Nausea and vomiting Patient with significant nausea and one episode of emesis yesterday.  Reason for this is not clear.  She is tender in the lower abdomen.  She has not had a bowel movement in several days.  Could be due to constipation.  However she is also on Cipro and Flagyl which were restarted recently which could also be the reason for her symptoms.  Will do abdominal film.  Check UA  since patient does mention some itching and burning sensation.  LFTs.  R BKA stump site dehiscence/wound infection/questionable history of endocarditis X-ray without evidence to suggest osteomyelitis. She finished a prolonged course of IV meropenem 9/25.  Blood cultures without any growth so far. Ortho performed irrigation and excisional debridement of right leg wound w/ intermediate closure 10/12. She will be nonweightbearing on the right lower extremity. Gram stain of the deep tissue cultures show gram-negative rods along with GPC.  Wound cultures grew out Klebsiella.  Infectious disease was consulted due to patient's previous history.   Patient started on cefepime.  Patient underwent echocardiogram which showed density on the mitral valve.  Cardiology was consulted.  They did not feel that this is a vegetation.  They say that this likely represents a segment of redundant mitral subvalvular patterns.  They did not recommend a TEE at this time.  Based on ID recommendations patient now on Cipro and Flagyl.  To get 10-day treatment.    Vertigo Seems to have improved.  Continue meclizine.  She will need to follow-up with ENT for her ear issues.    Recent COVID Diagnosed with Covid 9/10 while at [facility] and quarantined for 14 days. Last day of isolation was 10/1. CXR notes resolved bilateral lung opacities compared to previous exam Patient has been asymptomatic.  Acute kidney injury on CKD stage IV status post renal transplant/metabolic acidosis Baseline creatinine 2.1-2.4 Creatinine 3.4 at  presentation.  It appears that this was discussed with nephrology who suggested volume resuscitation which was provided.   Some worsening in her creatinine noted over the last several days.  Nephrology was consulted.  They did not feel that this is a significant deviation from her baseline.  They are also satisfied with her potassium less than 6.   Patient with significant nausea vomiting  yesterday.  Poor oral intake.  We will give her gentle IV hydration.   Bicarbonate level is stable.  Sodium bicarbonate dose was increased 2 days ago.   She is followed by Dr. Posey Pronto at [facility].  Noted to be on tacrolimus and prednisone.  Hyperkalemia Nephrology satisfied with her potassium level less than 6.  Continue Lokelma.  Patient noted to be on Veltassa at home.    Hypotension Likely due to volume depletion. Resolved with ongoing volume resuscitation. Random serum cortisol not low  Paroxysmal atrial fibrillation Continue Eliquis and amiodarone.  Currently in sinus rhythm.  DM 2 - controlled but with chronic neuropathy Does not require home medical therapy.  Occasional low glucose levels noted.  Likely due to poor oral intake.  Not noted to be on any glucose lowering agents here in the hospital.  Hypothyroidism Reported history of such but is not on thyroid hormone replacement - TSH signif elevated at 23.73 - free T4 actually normal at 0.8 - cont w/o supplementation for now - suggest recheck of TSH in 6-8 weeks   Normocytic anemia Likely due to chronic disease.  No evidence of overt bleeding.    Severe malnutrition in context of chronic illness Nutrition consulted  DVT prophylaxis: Eliquis Code Status: FULL CODE Family Communication: Discussed with the patient.  No family at bedside Disposition: Patient declines SNF.  Wants to go home.  Will need home health.  Status is: Inpatient  Remains inpatient appropriate because:Persistent severe electrolyte disturbances   Dispo: The patient is from: SNF              Anticipated d/c is to: Home              Anticipated d/c date is: 10/17              Patient currently is not medically stable to d/c.   Consultants:  Orthopedics Infectious disease Cardiology  Objective: Blood pressure 91/71, pulse 95, temperature 98.5 F (36.9 C), temperature source Oral, resp. rate 16, height 5\' 9"  (1.753 m), weight 50.9  kg, SpO2 93 %.  Intake/Output Summary (Last 24 hours) at 07/19/2020 1100 Last data filed at 07/18/2020 1700 Gross per 24 hour  Intake 594 ml  Output 400 ml  Net 194 ml   Filed Weights   07/08/20 1130  Weight: 50.9 kg    Examination:  General appearance: Awake alert.  In no distress Resp: Clear to auscultation bilaterally.  Normal effort Cardio: S1-S2 is normal regular.  No S3-S4.  No rubs murmurs or bruit GI: Abdomen is soft.  Tender in the lower abdomen without any rebound rigidity or guarding.  No masses organomegaly.  No obvious yeast infection noted in the GU area on inspection.   Neurologic: Alert and oriented x3.  No focal neurological deficits.     CBC: Recent Labs  Lab 07/17/20 0250 07/18/20 0221 07/19/20 0059  WBC 12.4* 11.9* 11.3*  HGB 10.5* 9.7* 10.3*  HCT 33.3* 30.3* 33.0*  MCV 90.5 92.4 93.0  PLT 146* 150 409   Basic Metabolic Panel: Recent Labs  Lab 07/05/2020 0403  07/15/20 0535 07/17/20 0250 07/18/20 0221 07/19/20 0059  NA 144   < > 145 144 145  K 5.8*   < > 5.6* 5.6* 5.4*  CL 119*   < > 119* 118* 118*  CO2 17*   < > 18* 16* 18*  GLUCOSE 43*   < > 76 139* 79  BUN 50*   < > 57* 60* 62*  CREATININE 2.99*   < > 3.25* 3.21* 3.36*  CALCIUM 8.4*   < > 8.4* 8.1* 8.4*  MG 2.1  --   --   --   --    < > = values in this interval not displayed.   GFR: Estimated Creatinine Clearance: 14.5 mL/min (A) (by C-G formula based on SCr of 3.36 mg/dL (H)).  Liver Function Tests: Recent Labs  Lab 07/06/2020 0403  AST 36  ALT 28  ALKPHOS 514*  BILITOT 0.5  PROT 5.2*  ALBUMIN 1.2*    HbA1C: Hgb A1c MFr Bld  Date/Time Value Ref Range Status  07/31/2020 04:12 AM 4.2 (L) 4.8 - 5.6 % Final    Comment:    (NOTE) Pre diabetes:          5.7%-6.4%  Diabetes:              >6.4%  Glycemic control for   <7.0% adults with diabetes   05/02/2020 01:06 AM 4.1 (L) 4.8 - 5.6 % Final    Comment:    (NOTE) Pre diabetes:          5.7%-6.4%  Diabetes:               >6.4%  Glycemic control for   <7.0% adults with diabetes     CBG: Recent Labs  Lab 07/17/20 2116 07/18/20 0806 07/18/20 1143 07/18/20 1641 07/18/20 2127  GLUCAP 114* 92 97 105* 93    Recent Results (from the past 240 hour(s))  MRSA PCR Screening     Status: None   Collection Time: 07/23/2020  1:10 PM   Specimen: Nasal Mucosa; Nasopharyngeal  Result Value Ref Range Status   MRSA by PCR NEGATIVE NEGATIVE Final    Comment:        The GeneXpert MRSA Assay (FDA approved for NASAL specimens only), is one component of a comprehensive MRSA colonization surveillance program. It is not intended to diagnose MRSA infection nor to guide or monitor treatment for MRSA infections. Performed at [facility], [place] [address], [place], [place] [address]   Aerobic/Anaerobic Culture (surgical/deep wound)     Status: None   Collection Time: 07/23/2020  1:46 PM   Specimen: PATH Soft tissue  Result Value Ref Range Status   Specimen Description TISSUE RIGHT LEG  Final   Special Requests DEEP SPEC A  Final   Gram Stain   Final    FEW WBC PRESENT,BOTH PMN AND MONONUCLEAR RARE GRAM POSITIVE COCCI RARE GRAM NEGATIVE RODS Performed at [facility], [place] [address], [place],  [address]    Culture   Final    FEW KLEBSIELLA PNEUMONIAE FEW ENTEROBACTER CLOACAE FEW PSEUDOMONAS AERUGINOSA MODERATE BACTEROIDES FRAGILIS    Report Status 07/17/2020 FINAL  Final   Organism ID, Bacteria KLEBSIELLA PNEUMONIAE  Final   Organism ID, Bacteria ENTEROBACTER CLOACAE  Final   Organism ID, Bacteria PSEUDOMONAS AERUGINOSA  Final      Susceptibility   Enterobacter cloacae - MIC*    CEFAZOLIN >=64 RESISTANT Resistant     CEFEPIME <=0.12 SENSITIVE Sensitive  CEFTAZIDIME <=1 SENSITIVE Sensitive     CIPROFLOXACIN <=0.25 SENSITIVE Sensitive     GENTAMICIN <=1 SENSITIVE Sensitive     IMIPENEM 0.5 SENSITIVE Sensitive     TRIMETH/SULFA <=20 SENSITIVE Sensitive     PIP/TAZO <=4 SENSITIVE  Sensitive     * FEW ENTEROBACTER CLOACAE   Klebsiella pneumoniae - MIC*    AMPICILLIN RESISTANT Resistant     CEFAZOLIN <=4 SENSITIVE Sensitive     CEFEPIME <=0.12 SENSITIVE Sensitive     CEFTAZIDIME <=1 SENSITIVE Sensitive     CEFTRIAXONE <=0.25 SENSITIVE Sensitive     CIPROFLOXACIN <=0.25 SENSITIVE Sensitive     GENTAMICIN <=1 SENSITIVE Sensitive     IMIPENEM 0.5 SENSITIVE Sensitive     TRIMETH/SULFA <=20 SENSITIVE Sensitive     AMPICILLIN/SULBACTAM <=2 SENSITIVE Sensitive     PIP/TAZO <=4 SENSITIVE Sensitive     * FEW KLEBSIELLA PNEUMONIAE   Pseudomonas aeruginosa - MIC*    CEFTAZIDIME 4 SENSITIVE Sensitive     CIPROFLOXACIN <=0.25 SENSITIVE Sensitive     GENTAMICIN <=1 SENSITIVE Sensitive     IMIPENEM 1 SENSITIVE Sensitive     PIP/TAZO 8 SENSITIVE Sensitive     CEFEPIME 2 SENSITIVE Sensitive     * FEW PSEUDOMONAS AERUGINOSA     Scheduled Meds: . amiodarone  200 mg Oral Daily  . apixaban  2.5 mg Oral BID  . ascorbic acid  250 mg Oral BID  . cholecalciferol  1,000 Units Oral Daily  . ciprofloxacin  500 mg Oral Daily  . docusate sodium  100 mg Oral BID  . feeding supplement (NEPRO CARB STEADY)  237 mL Oral TID BM  . folic acid  1 mg Oral Daily  . hydrocortisone  25 mg Rectal BID  . meclizine  12.5 mg Oral TID  . metroNIDAZOLE  500 mg Oral Q8H  . mirtazapine  7.5 mg Oral QHS  . multivitamin with minerals  1 tablet Oral Daily  . nortriptyline  25 mg Oral QHS  . pantoprazole  40 mg Oral Daily  . predniSONE  5 mg Oral Daily  . ramelteon  8 mg Oral QHS  . senna  1 tablet Oral BID  . senna-docusate  1 tablet Oral BID  . sodium bicarbonate  1,300 mg Oral TID  . sodium zirconium cyclosilicate  10 g Oral BID  . tacrolimus  1.5 mg Oral Q12H  . thiamine  100 mg Oral Daily  . zinc sulfate  220 mg Oral Daily   Continuous Infusions: . sodium chloride 50 mL/hr at 07/19/20 0959  . sodium chloride Stopped (07/15/20 1620)     LOS: 11 days   [facility] Office  [phone] Pager - Text Page per Shea Evans  If 7PM-7AM, please contact night-coverage per Amion 07/19/2020, 11:00 AM

## 2020-07-20 ENCOUNTER — Telehealth: Payer: Medicare HMO | Admitting: Infectious Diseases

## 2020-07-20 DIAGNOSIS — T148XXA Other injury of unspecified body region, initial encounter: Secondary | ICD-10-CM | POA: Diagnosis not present

## 2020-07-20 DIAGNOSIS — K5900 Constipation, unspecified: Secondary | ICD-10-CM

## 2020-07-20 DIAGNOSIS — E162 Hypoglycemia, unspecified: Secondary | ICD-10-CM | POA: Diagnosis not present

## 2020-07-20 DIAGNOSIS — N179 Acute kidney failure, unspecified: Secondary | ICD-10-CM | POA: Diagnosis not present

## 2020-07-20 DIAGNOSIS — E875 Hyperkalemia: Secondary | ICD-10-CM | POA: Diagnosis not present

## 2020-07-20 LAB — BASIC METABOLIC PANEL
Anion gap: 9 (ref 5–15)
BUN: 64 mg/dL — ABNORMAL HIGH (ref 6–20)
CO2: 19 mmol/L — ABNORMAL LOW (ref 22–32)
Calcium: 8.1 mg/dL — ABNORMAL LOW (ref 8.9–10.3)
Chloride: 119 mmol/L — ABNORMAL HIGH (ref 98–111)
Creatinine, Ser: 3.35 mg/dL — ABNORMAL HIGH (ref 0.44–1.00)
GFR, Estimated: 14 mL/min — ABNORMAL LOW (ref >60)
Glucose, Bld: 65 mg/dL — ABNORMAL LOW (ref 70–99)
Potassium: 5.2 mmol/L — ABNORMAL HIGH (ref 3.5–5.1)
Sodium: 147 mmol/L — ABNORMAL HIGH (ref 135–145)

## 2020-07-20 LAB — GLUCOSE, CAPILLARY
Glucose-Capillary: 53 mg/dL — ABNORMAL LOW (ref 70–99)
Glucose-Capillary: 66 mg/dL — ABNORMAL LOW (ref 70–99)
Glucose-Capillary: 66 mg/dL — ABNORMAL LOW (ref 70–99)
Glucose-Capillary: 151 mg/dL — ABNORMAL HIGH (ref 70–99)
Glucose-Capillary: 94 mg/dL (ref 70–99)
Glucose-Capillary: 101 mg/dL — ABNORMAL HIGH (ref 70–99)

## 2020-07-20 LAB — CBC
HCT: 28.5 % — ABNORMAL LOW (ref 36.0–46.0)
Hemoglobin: 8.9 g/dL — ABNORMAL LOW (ref 12.0–15.0)
MCH: 28.5 pg (ref 26.0–34.0)
MCHC: 31.2 g/dL (ref 30.0–36.0)
MCV: 91.3 fL (ref 80.0–100.0)
Platelets: 139 10*3/uL — ABNORMAL LOW (ref 150–400)
RBC: 3.12 MIL/uL — ABNORMAL LOW (ref 3.87–5.11)
RDW: 20.8 % — ABNORMAL HIGH (ref 11.5–15.5)
WBC: 8.8 10*3/uL (ref 4.0–10.5)
nRBC: 0 % (ref 0.0–0.2)

## 2020-07-20 MED ORDER — COSYNTROPIN 0.25 MG IJ SOLR
0.2500 mg | Freq: Once | INTRAMUSCULAR | Status: AC
  Administered 2020-07-21: 0.25 mg via INTRAVENOUS
  Filled 2020-07-20: qty 0.25

## 2020-07-20 MED ORDER — DEXTROSE 5 % IV SOLN: INTRAVENOUS | Status: DC

## 2020-07-20 MED ORDER — DOCUSATE SODIUM 283 MG RE ENEM
1.0000 | ENEMA | Freq: Once | RECTAL | Status: AC
  Administered 2020-07-20: 283 mg via RECTAL
  Filled 2020-07-20: qty 1

## 2020-07-20 NOTE — Progress Notes (Signed)
Subjective: 6 Days Post-Op Procedure(s) (LRB): Revision right below knee amputation (Right)  Patient reports pain as mild to moderate.  Notes that her nausea from a few days ago has resolved, and that she has an appetite again. Resting comfortably in bed.   Objective:   VITALS:  Temp:  [97.9 F (36.6 C)-98.3 F (36.8 C)] 97.9 F (36.6 C) (10/17 2208) Pulse Rate:  [86-94] 86 (10/17 2208) Resp:  [12-18] 18 (10/17 2208) BP: (111-114)/(57-58) 111/57 (10/17 2208) SpO2:  [98 %-100 %] 98 % (10/17 2208)  General: WDWN patient in NAD. Psych:  Appropriate mood and affect. Neuro:  A&O x 3, Moving all extremities, sensation intact to light touch HEENT:  EOMs intact Chest:  Even non-labored respirations Skin:  Dressing C/D/I, no rashes or lesions Extremities: warm/dry, no visible edema, erythema or echymosis.  No lymphadenopathy. Pulses: Femoral 2+ MSK:  ROM: TKE, MMT: able to perform quad set    LABS Recent Labs    07/18/20 0221 07/18/20 0221 07/19/20 0059 07/20/20 0412  HGB 9.7*  --  10.3* 8.9*  WBC 11.9*   < > 11.3* 8.8  PLT 150   < > 165 139*   < > = values in this interval not displayed.   Recent Labs    07/19/20 0059 07/20/20 0412  NA 145 147*  K 5.4* 5.2*  CL 118* 119*  CO2 18* 19*  BUN 62* 64*  CREATININE 3.36* 3.35*  GLUCOSE 79 65*   No results for input(s): LABPT, INR in the last 72 hours.   Assessment/Plan: 6 Days Post-Op Procedure(s) (LRB): Revision right below knee amputation (Right)  NWB R LE Up with therapy ABX per ID Plan for 2 week outpatient post-op visit with Dr. Doran Durand. Ortho signing off.  Please call with any questions/concerns.  Mechele Claude PA-C [facility] Office:  [phone]

## 2020-07-20 NOTE — Progress Notes (Signed)
PT Cancellation Note  Patient Details Name: Wanda [name] MRN: [id number] DOB: 1961-05-08   Cancelled Treatment:    Reason Eval/Treat Not Completed: Patient declined, no reason specified patient adamantly refuses therapy today due to constipation and pain; educated on benefits of mobility with clearing BMs, as well as importance of participating in therapy especially if her plan is to return home versus going to SNF as per PT recommendations. Unable to convince her to participate even in low level session today. This is her 3rd consecutive refusal- we will try again tomorrow but if she continues to refuse unfortunately we will need to sign off per dept protocol.    Windell Norfolk, DPT, PN1   Supplemental Physical Therapist [facility]    Pager [phone] Acute Rehab Office [phone]

## 2020-07-20 NOTE — Progress Notes (Signed)
Colace pill found on floor of patient's room.

## 2020-07-20 NOTE — Progress Notes (Signed)
Wanda [name]  ATF:[id number] DOB: 04-30-1961 DOA: 07/12/2020 PCP: Lorene Dy, MD    Brief Narrative:  [phone] with a history of PAF on Eliquis, CKD stage IV status post renal transplant, anemia of CKD, gastric sleeve, hypoalbuminemia, DM2, HTN, hypothyroidism, GERD, PAD, and chronic neuropathy who was admitted to the hospital 05/01/2020-06/01/2020 in septic shock secondary to a R foot ulcer with Klebsiella bacteremia complicated by endocarditis.  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  She returned to the ED 10/5 with a 1 week history of drainage and dehiscence of the stump site.  In the ED the patient was found to have an elevated WBC.  X-ray right tibia/fibula was without evidence of osseous abnormality.  She was found to be hyperkalemic at 6.0 and in acute renal failure with creatinine 3.4 up from baseline of 2.4.    Significant Events:  7/30 > 8/30 admit right foot cellulitis and Klebsiella bacteremia > R BKA 10/5 admit w/ AKI, hyperkalemia, and wound dehiscence  10/12 to OR for wound revision   Antimicrobials:  none  DVT prophylaxis: Eliquis  Subjective: Patient mentions that she had a small bowel movement yesterday.  The nurse yesterday reported a large bowel movement.  This seems to be a discrepancy.  Discussed with nursing staff today.  Patient does feel better but still has discomfort in the lower abdomen.  Still feels full.  Nausea appears to have improved.  Appetite remains poor.  No other complaints offered.     Assessment & Plan:  Nausea secondary to severe constipation Patient had significant nausea 2 days ago with 1 episode of vomiting.  Abdominal x-ray showed significant stool burden.  This is likely in the setting of narcotic use for pain in her right lower extremity.  Patient was given laxatives and given a suppository as well.  Does not appear like she has had a good response yet.  Some  concern for impaction is present.  Disimpaction ordered.  Will give enema as well.  Patient has some tenderness over her suprapubic area.  Bladder scan has been ordered.  LFTs were normal.    R BKA stump site dehiscence/wound infection/questionable history of endocarditis X-ray without evidence to suggest osteomyelitis. She finished a prolonged course of IV meropenem 9/25.  Blood cultures without any growth so far. Ortho performed irrigation and excisional debridement of right leg wound w/ intermediate closure 10/12. She will be nonweightbearing on the right lower extremity. Gram stain of the deep tissue cultures show gram-negative rods along with GPC.  Wound cultures grew out Klebsiella.  Infectious disease was consulted due to patient's previous history.   Patient started on cefepime.  Patient underwent echocardiogram which showed density on the mitral valve.  Cardiology was consulted.  They did not feel that this is a vegetation.  They say that this likely represents a segment of redundant mitral subvalvular patterns.  They did not recommend a TEE at this time.  Based on ID recommendations patient now on Cipro and Flagyl.  To get 10-day treatment.    Vertigo Seems to have improved.  Continue meclizine.  She will need to follow-up with ENT for her ear issues.    Recent COVID Diagnosed with Covid 9/10 while at [facility] and quarantined for 14 days. Last day of isolation was 10/1. CXR notes resolved bilateral lung opacities compared to previous exam Patient has been asymptomatic.  Acute kidney  injury on CKD stage IV status post renal transplant/metabolic acidosis Baseline creatinine 2.1-2.4 Creatinine 3.4 at presentation.  It appears that this was discussed with nephrology who suggested volume resuscitation which was provided.   Some worsening in her creatinine noted over the last several days.  Nephrology was consulted.  They did not feel that this is a significant deviation from  her baseline.  They are also satisfied with her potassium less than 6.   Given IV fluids yesterday due to slightly higher creatinine and poor oral intake.  Patient encouraged to eat.  Bicarbonate level stable.  She remains on sodium bicarbonate.  Nephrology is following.  Hypernatremia is noted. She is followed by Dr. Posey Pronto at [facility].  Noted to be on tacrolimus and prednisone.  Hyperkalemia Nephrology satisfied with her potassium level less than 6.  Continue Lokelma.  Patient noted to be on Veltassa at home.  Potassium is stable.  Hypotension Likely due to volume depletion. Resolved with ongoing volume resuscitation. Random serum cortisol not low  Paroxysmal atrial fibrillation Continue Eliquis and amiodarone.  Currently in sinus rhythm.  DM 2 - controlled but with chronic neuropathy Does not require home medical therapy.  Hypoglycemia noted this morning.  Clearly secondary to poor oral intake.  May need D50.  Not noted to be on any glucose lowering agents in the hospital.  Cortisol level was 8.4 on 10/8.  Hypothyroidism Reported history of such but is not on thyroid hormone replacement - TSH signif elevated at 23.73 - free T4 actually normal at 0.8 - cont w/o supplementation for now - suggest recheck of TSH in 6-8 weeks   Normocytic anemia Likely due to chronic disease.  Drop in hemoglobin noted today.  No overt bleeding noted.  Recheck tomorrow.  Severe malnutrition in context of chronic illness Nutrition consulted  DVT prophylaxis: Eliquis Code Status: FULL CODE Family Communication: Discussed with the patient.  No family at bedside Disposition: Patient declines SNF.  Wants to go home.  Will need home health.  Status is: Inpatient  Remains inpatient appropriate because:Persistent severe electrolyte disturbances   Dispo: The patient is from: SNF              Anticipated d/c is to: Home              Anticipated d/c date is: 10/19              Patient  currently is not medically stable to d/c.   Consultants:  Orthopedics Infectious disease Cardiology  Objective: Blood pressure (!) 116/32, pulse 90, temperature 98 F (36.7 C), temperature source Oral, resp. rate 18, height 5\' 9"  (1.753 m), weight 50.9 kg, SpO2 99 %.  Intake/Output Summary (Last 24 hours) at 07/20/2020 1111 Last data filed at 07/20/2020 0420 Gross per 24 hour  Intake 402.55 ml  Output --  Net 402.55 ml   Filed Weights   07/08/20 1130  Weight: 50.9 kg    Examination:  General appearance: Awake alert.  In no distress Resp: Clear to auscultation bilaterally.  Normal effort Cardio: S1-S2 is normal regular.  No S3-S4.  No rubs murmurs or bruit GI: Abdomen is soft.  Tender in the suprapubic area with some fullness.  Rest of the abdomen is benign.  No masses organomegaly.  Bowel sounds present.  Neurologic: Alert and oriented x3.  No focal neurological deficits.      CBC: Recent Labs  Lab 07/18/20 0221 07/19/20 0059 07/20/20 0412  WBC 11.9* 11.3* 8.8  HGB 9.7* 10.3* 8.9*  HCT 30.3* 33.0* 28.5*  MCV 92.4 93.0 91.3  PLT 150 165 119*   Basic Metabolic Panel: Recent Labs  Lab 07/25/2020 0403 07/15/20 0535 07/18/20 0221 07/19/20 0059 07/20/20 0412  NA 144   < > 144 145 147*  K 5.8*   < > 5.6* 5.4* 5.2*  CL 119*   < > 118* 118* 119*  CO2 17*   < > 16* 18* 19*  GLUCOSE 43*   < > 139* 79 65*  BUN 50*   < > 60* 62* 64*  CREATININE 2.99*   < > 3.21* 3.36* 3.35*  CALCIUM 8.4*   < > 8.1* 8.4* 8.1*  MG 2.1  --   --   --   --    < > = values in this interval not displayed.   GFR: Estimated Creatinine Clearance: 14.5 mL/min (A) (by C-G formula based on SCr of 3.35 mg/dL (H)).  Liver Function Tests: Recent Labs  Lab 07/06/2020 0403 07/19/20 0059  AST 36 27  ALT 28 6  ALKPHOS 514* 414*  BILITOT 0.5 0.6  PROT 5.2* 5.4*  ALBUMIN 1.2* 1.2*    HbA1C: Hgb A1c MFr Bld  Date/Time Value Ref Range Status  07/23/2020 04:12 AM 4.2 (L) 4.8 - 5.6 % Final     Comment:    (NOTE) Pre diabetes:          5.7%-6.4%  Diabetes:              >6.4%  Glycemic control for   <7.0% adults with diabetes   05/02/2020 01:06 AM 4.1 (L) 4.8 - 5.6 % Final    Comment:    (NOTE) Pre diabetes:          5.7%-6.4%  Diabetes:              >6.4%  Glycemic control for   <7.0% adults with diabetes     CBG: Recent Labs  Lab 07/18/20 2127 07/19/20 1205 07/19/20 1607 07/20/20 0732 07/20/20 0856  GLUCAP 93 85 85 53* 66*    Recent Results (from the past 240 hour(s))  Aerobic/Anaerobic Culture (surgical/deep wound)     Status: None   Collection Time: 07/09/2020  1:46 PM   Specimen: PATH Soft tissue  Result Value Ref Range Status   Specimen Description TISSUE RIGHT LEG  Final   Special Requests DEEP SPEC A  Final   Gram Stain   Final    FEW WBC PRESENT,BOTH PMN AND MONONUCLEAR RARE GRAM POSITIVE COCCI RARE GRAM NEGATIVE RODS Performed at [facility], [place] [address], [place], [place] [address]    Culture   Final    FEW KLEBSIELLA PNEUMONIAE FEW ENTEROBACTER CLOACAE FEW PSEUDOMONAS AERUGINOSA MODERATE BACTEROIDES FRAGILIS    Report Status 07/17/2020 FINAL  Final   Organism ID, Bacteria KLEBSIELLA PNEUMONIAE  Final   Organism ID, Bacteria ENTEROBACTER CLOACAE  Final   Organism ID, Bacteria PSEUDOMONAS AERUGINOSA  Final      Susceptibility   Enterobacter cloacae - MIC*    CEFAZOLIN >=64 RESISTANT Resistant     CEFEPIME <=0.12 SENSITIVE Sensitive     CEFTAZIDIME <=1 SENSITIVE Sensitive     CIPROFLOXACIN <=0.25 SENSITIVE Sensitive     GENTAMICIN <=1 SENSITIVE Sensitive     IMIPENEM 0.5 SENSITIVE Sensitive     TRIMETH/SULFA <=20 SENSITIVE Sensitive     PIP/TAZO <=4 SENSITIVE Sensitive     * FEW ENTEROBACTER CLOACAE   Klebsiella pneumoniae - MIC*  AMPICILLIN RESISTANT Resistant     CEFAZOLIN <=4 SENSITIVE Sensitive     CEFEPIME <=0.12 SENSITIVE Sensitive     CEFTAZIDIME <=1 SENSITIVE Sensitive     CEFTRIAXONE <=0.25 SENSITIVE  Sensitive     CIPROFLOXACIN <=0.25 SENSITIVE Sensitive     GENTAMICIN <=1 SENSITIVE Sensitive     IMIPENEM 0.5 SENSITIVE Sensitive     TRIMETH/SULFA <=20 SENSITIVE Sensitive     AMPICILLIN/SULBACTAM <=2 SENSITIVE Sensitive     PIP/TAZO <=4 SENSITIVE Sensitive     * FEW KLEBSIELLA PNEUMONIAE   Pseudomonas aeruginosa - MIC*    CEFTAZIDIME 4 SENSITIVE Sensitive     CIPROFLOXACIN <=0.25 SENSITIVE Sensitive     GENTAMICIN <=1 SENSITIVE Sensitive     IMIPENEM 1 SENSITIVE Sensitive     PIP/TAZO 8 SENSITIVE Sensitive     CEFEPIME 2 SENSITIVE Sensitive     * FEW PSEUDOMONAS AERUGINOSA     Scheduled Meds: . amiodarone  200 mg Oral Daily  . apixaban  2.5 mg Oral BID  . ascorbic acid  250 mg Oral BID  . cholecalciferol  1,000 Units Oral Daily  . ciprofloxacin  500 mg Oral Daily  . docusate sodium  100 mg Oral BID  . feeding supplement (NEPRO CARB STEADY)  237 mL Oral TID BM  . folic acid  1 mg Oral Daily  . hydrocortisone  25 mg Rectal BID  . meclizine  12.5 mg Oral TID  . metroNIDAZOLE  500 mg Oral Q8H  . mirtazapine  7.5 mg Oral QHS  . multivitamin with minerals  1 tablet Oral Daily  . nortriptyline  25 mg Oral QHS  . pantoprazole  40 mg Oral Daily  . polyethylene glycol  17 g Oral BID  . predniSONE  5 mg Oral Daily  . ramelteon  8 mg Oral QHS  . senna  1 tablet Oral BID  . sodium bicarbonate  1,300 mg Oral TID  . sodium zirconium cyclosilicate  10 g Oral BID  . tacrolimus  1.5 mg Oral Q12H  . thiamine  100 mg Oral Daily  . zinc sulfate  220 mg Oral Daily   Continuous Infusions: . sodium chloride 10 mL/hr at 07/20/20 0342     LOS: 12 days   [facility] Office  [phone] Pager - Text Page per Shea Evans  If 7PM-7AM, please contact night-coverage per Amion 07/20/2020, 11:11 AM

## 2020-07-20 NOTE — Progress Notes (Signed)
Bladder scan 232ml

## 2020-07-20 NOTE — Plan of Care (Signed)
  Problem: Health Behavior/Discharge Planning: Goal: Ability to manage health-related needs will improve Outcome: Progressing   Problem: Clinical Measurements: Goal: Ability to maintain clinical measurements within normal limits will improve Outcome: Not Progressing Goal: Will remain free from infection Outcome: Progressing Goal: Diagnostic test results will improve Outcome: Not Progressing Goal: Respiratory complications will improve Outcome: Progressing Goal: Cardiovascular complication will be avoided Outcome: Progressing   Problem: Activity: Goal: Risk for activity intolerance will decrease Outcome: Not Progressing

## 2020-07-20 NOTE — Progress Notes (Signed)
Admit: 07/12/2020 LOS: 12  44F s/p KT 2020 with CKD4T Tac/Pred; admitted with dehisced and infected recent right BKA now on broad-spectrum antibiotics; persistent hyperkalemia and metabolic acidosis.  Subjective:  . No complaints this AM. No acute events. . On maintenance IV fluids . Creatinine stable this AM 3.4. UOP not recorded . No uremic symptoms  10/17 0701 - 10/18 0700 In: 402.6 [I.V.:402.6] Out: -   Filed Weights   07/08/20 1130  Weight: 50.9 kg    Scheduled Meds: . amiodarone  200 mg Oral Daily  . apixaban  2.5 mg Oral BID  . ascorbic acid  250 mg Oral BID  . cholecalciferol  1,000 Units Oral Daily  . ciprofloxacin  500 mg Oral Daily  . docusate sodium  100 mg Oral BID  . docusate sodium  1 enema Rectal Once  . feeding supplement (NEPRO CARB STEADY)  237 mL Oral TID BM  . folic acid  1 mg Oral Daily  . hydrocortisone  25 mg Rectal BID  . meclizine  12.5 mg Oral TID  . metroNIDAZOLE  500 mg Oral Q8H  . mirtazapine  7.5 mg Oral QHS  . multivitamin with minerals  1 tablet Oral Daily  . nortriptyline  25 mg Oral QHS  . pantoprazole  40 mg Oral Daily  . polyethylene glycol  17 g Oral BID  . predniSONE  5 mg Oral Daily  . ramelteon  8 mg Oral QHS  . senna  1 tablet Oral BID  . sodium bicarbonate  1,300 mg Oral TID  . sodium zirconium cyclosilicate  10 g Oral BID  . tacrolimus  1.5 mg Oral Q12H  . thiamine  100 mg Oral Daily  . zinc sulfate  220 mg Oral Daily   Continuous Infusions: . sodium chloride 10 mL/hr at 07/20/20 0342   PRN Meds:.acetaminophen, alum & mag hydroxide-simeth, dextrose, dicyclomine, diphenhydrAMINE, diphenhydrAMINE, docusate sodium, naphazoline-glycerin, ondansetron **OR** ondansetron (ZOFRAN) IV, oxyCODONE, traMADol  Current Labs: reviewed   Physical Exam:  Blood pressure (!) 111/57, pulse 86, temperature 97.9 F (36.6 C), temperature source Oral, resp. rate 18, height 5\' 9"  (1.753 m), weight 50.9 kg, SpO2 98 %. GEN: chronically ill  appearing, NAD ENT: temporal wasting EYES: EOMI CV: Regular, nl s1s2 PULM: CTAB ABD: soft SKIN: RLE bandagd EXT:No sig LLE edema.  Unna boot  A 1. CKD 4T: Probably at her baseline, I am not sure there is an acute component.  Continue current transplant regimen. 2. 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 Chronic metabolic acidosis, from type IV RTA, increased sodium bicarbonate to 1300 mg 3 times weekly, CTM 4. 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 DM2 6. AFib 7. Anemia, Hb lower today, mgmt per primary service 8. HTN, BP stable  P . Cont supportive care, PT/OT . Labs reviewed, renal function stable . Trend K and HCO3. K & bicarb stable today . Continue outpatient immunosuppression of tacrolimus and prednisone . Medication Issues; o Preferred narcotic agents for pain control are hydromorphone, fentanyl, and methadone. Morphine should not be used.  o Baclofen should be avoided o Avoid oral sodium phosphate and magnesium citrate based laxatives / bowel preps    Gean Quint, MD [place] 07/20/2020, 9:53 AM  Recent Labs  Lab 07/18/20 0221 07/19/20 0059 07/20/20 0412  NA 144 145 147*  K 5.6*  5.4* 5.2*  CL 118* 118* 119*  CO2 16* 18* 19*  GLUCOSE 139* 79 65*  BUN 60* 62* 64*  CREATININE 3.21* 3.36* 3.35*  CALCIUM 8.1* 8.4* 8.1*   Recent Labs  Lab 07/18/20 0221 07/19/20 0059 07/20/20 0412  WBC 11.9* 11.3* 8.8  HGB 9.7* 10.3* 8.9*  HCT 30.3* 33.0* 28.5*  MCV 92.4 93.0 91.3  PLT 150 165 139*

## 2020-07-20 NOTE — Progress Notes (Signed)
Hypoglycemic Event  CBG: 53, 66, 66  Treatment: 8 oz juice/soda and D50 50 mL (25 gm)  Symptoms: None  Follow-up CBG: Time:1134 CBG Result:151  Possible Reasons for Event: Inadequate meal intake  Comments/MD notified: MD notified, D5 started.    Joni Reining

## 2020-07-20 NOTE — Plan of Care (Signed)
  Problem: Education: Goal: Knowledge of General Education information will improve Description: Including pain rating scale, medication(s)/side effects and non-pharmacologic comfort measures Outcome: Progressing   Problem: Health Behavior/Discharge Planning: Goal: Ability to manage health-related needs will improve Outcome: Progressing   Problem: Nutrition: Goal: Adequate nutrition will be maintained Outcome: Progressing  Patient with low blood sugars this morning, after several offers of beverages/snacks, then D50, D5 was started at low rate. Patient denying symptoms such as feeling cold clammy or confused.

## 2020-07-20 NOTE — Progress Notes (Signed)
Per order, disimpaction attempted. Lubricant used, RN able to remove 1/2 to 1 cup of solid stool.  Patient with large amount of soft stool in rectum, no bleeding noted during or after procedure. Procedure ended when patient stated she was uncomfortable and did not want to continue.

## 2020-07-21 ENCOUNTER — Other Ambulatory Visit: Payer: Self-pay

## 2020-07-21 ENCOUNTER — Inpatient Hospital Stay (HOSPITAL_COMMUNITY): Payer: Medicare HMO

## 2020-07-21 DIAGNOSIS — K5900 Constipation, unspecified: Secondary | ICD-10-CM | POA: Diagnosis not present

## 2020-07-21 DIAGNOSIS — N179 Acute kidney failure, unspecified: Secondary | ICD-10-CM | POA: Diagnosis not present

## 2020-07-21 DIAGNOSIS — E162 Hypoglycemia, unspecified: Secondary | ICD-10-CM | POA: Diagnosis not present

## 2020-07-21 DIAGNOSIS — T148XXA Other injury of unspecified body region, initial encounter: Secondary | ICD-10-CM | POA: Diagnosis not present

## 2020-07-21 LAB — CBC
HCT: 29.2 % — ABNORMAL LOW (ref 36.0–46.0)
Hemoglobin: 9.5 g/dL — ABNORMAL LOW (ref 12.0–15.0)
MCH: 29.5 pg (ref 26.0–34.0)
MCHC: 32.5 g/dL (ref 30.0–36.0)
MCV: 90.7 fL (ref 80.0–100.0)
Platelets: 230 10*3/uL (ref 150–400)
RBC: 3.22 MIL/uL — ABNORMAL LOW (ref 3.87–5.11)
RDW: 21.4 % — ABNORMAL HIGH (ref 11.5–15.5)
WBC: 11.8 10*3/uL — ABNORMAL HIGH (ref 4.0–10.5)
nRBC: 0.3 % — ABNORMAL HIGH (ref 0.0–0.2)

## 2020-07-21 LAB — URINALYSIS, ROUTINE W REFLEX MICROSCOPIC
Bilirubin Urine: NEGATIVE
Glucose, UA: NEGATIVE mg/dL
Ketones, ur: NEGATIVE mg/dL
Nitrite: NEGATIVE
Protein, ur: 100 mg/dL — AB
RBC / HPF: >50 RBC/hpf — ABNORMAL HIGH (ref 0–5)
Specific Gravity, Urine: 1.008 (ref 1.005–1.030)
WBC, UA: >50 WBC/hpf — ABNORMAL HIGH (ref 0–5)
pH: 7 (ref 5.0–8.0)

## 2020-07-21 LAB — BASIC METABOLIC PANEL
Anion gap: 8 (ref 5–15)
BUN: 67 mg/dL — ABNORMAL HIGH (ref 6–20)
CO2: 18 mmol/L — ABNORMAL LOW (ref 22–32)
Calcium: 8.3 mg/dL — ABNORMAL LOW (ref 8.9–10.3)
Chloride: 119 mmol/L — ABNORMAL HIGH (ref 98–111)
Creatinine, Ser: 3.59 mg/dL — ABNORMAL HIGH (ref 0.44–1.00)
GFR, Estimated: 13 mL/min — ABNORMAL LOW (ref >60)
Glucose, Bld: 114 mg/dL — ABNORMAL HIGH (ref 70–99)
Potassium: 5 mmol/L (ref 3.5–5.1)
Sodium: 145 mmol/L (ref 135–145)

## 2020-07-21 LAB — ACTH STIMULATION, 3 TIME POINTS
Cortisol, 30 Min: 22.2 ug/dL
Cortisol, 60 Min: 23.6 ug/dL
Cortisol, Base: 16.4 ug/dL

## 2020-07-21 LAB — GLUCOSE, CAPILLARY: Glucose-Capillary: 156 mg/dL — ABNORMAL HIGH (ref 70–99)

## 2020-07-21 MED ORDER — SORBITOL 70 % SOLN
960.0000 mL | TOPICAL_OIL | Freq: Once | ORAL | Status: AC
  Administered 2020-07-21: 960 mL via RECTAL
  Filled 2020-07-21: qty 473

## 2020-07-21 MED ORDER — LEVOTHYROXINE SODIUM 25 MCG PO TABS
25.0000 ug | ORAL_TABLET | Freq: Every day | ORAL | Status: DC
  Administered 2020-07-21 – 2020-07-29 (×9): 25 ug via ORAL
  Filled 2020-07-21 (×9): qty 1

## 2020-07-21 MED ORDER — DEXTROSE 5 % IV SOLN: INTRAVENOUS | Status: DC

## 2020-07-21 MED ORDER — HYDROCORTISONE (PERIANAL) 2.5 % EX CREA
TOPICAL_CREAM | Freq: Three times a day (TID) | CUTANEOUS | Status: DC
  Administered 2020-07-21: 1 via RECTAL
  Filled 2020-07-21 (×2): qty 28.35

## 2020-07-21 NOTE — Progress Notes (Signed)
Pharmacy Antibiotic Note  Wanda [name] is a 59 y.o. female admitted on 07/08/2020 with wound infection.  Pharmacy has been consulted for Cipro dosing.  Of note, PMH includes CKD stage IV s/p renal transplant, and admission 7/30-8/30/2021 for septic shock secondary to a R foot ulcer with Klebsiella bacteremia complicated by endocarditis. 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  She returned to the ED 10/5 with a 1 week history of drainage and dehiscence of the stump site. Had I & D on 10/12 AKI- SCr increased at 3.59 (BL ~2.4).  **Cipro has a major interaction with tacrolimus. Pharmacologic effects and serum concentrations of tacrolimus may be increased by ciprofloxacin (weak CYP3A4 inhibitors).  -- Tacrolimus levels are being followed, last from 10/13 at 7.3, repeat level sent this AM   Plan: Continue Cipro 500 mg PO every 24 hours Metronidazole per MD Monitor clinical progress, cultures/sensitivities, renal function, abx plan Monitor tacrolimus levels while on Cipro   Height: 5\' 9"  (175.3 cm) (Pt stated) Weight: 50.9 kg (112 lb 3.4 oz) IBW/kg (Calculated) : 66.2  Temp (24hrs), Avg:97.6 F (36.4 C), Min:96.7 F (35.9 C), Max:98.1 F (36.7 C)  Recent Labs  Lab 07/17/20 0250 07/18/20 0221 07/19/20 0059 07/20/20 0412 07/21/20 0559  WBC 12.4* 11.9* 11.3* 8.8 11.8*  CREATININE 3.25* 3.21* 3.36* 3.35* 3.59*    Estimated Creatinine Clearance: 13.6 mL/min (A) (by C-G formula based on SCr of 3.59 mg/dL (H)).    Allergies  Allergen Reactions  . Propoxyphene N-Acetaminophen Other (See Comments)    Severe headache  . Hydrocodone-Acetaminophen Other (See Comments)     Severe headache   . Astemizole Rash and Other (See Comments)    headache   . Cephalosporins Rash    Antimicrobials this admission: 10/12 Ancef x 1 (periop) 10/14 Cefepime >> 10/15 10/15 Flagyl >> 10/16 Cipro >>  Dose adjustments this  admission:  Microbiology results: 10/12 wound culture: Kleb pneumo, enterobacter cloacae, pseudomonas  10/5 BCx: ngf 10/5 RVP: COVID +  10/7 MRSA PCR: neg   Thank you for allowing [place] to participate in this patients care.   Sloan Leiter, PharmD, BCPS, BCCCP Clinical Pharmacist Please refer to [facility] for [place] numbers 07/21/2020 3:58 PM

## 2020-07-21 NOTE — Progress Notes (Signed)
PT Cancellation Note  Patient Details Name: Wanda [name] MRN: [id number] DOB: 10-Jan-1961   Cancelled Treatment:    Reason Eval/Treat Not Completed: Patient at procedure or test/unavailable; attempted twice to see pt. First she was trying to have a BM and in pain, but agreeable to try again later; second attempt, transport present taking her to ultrasound.  Will attempt again another day.   Reginia Naas 07/21/2020, 3:09 PM  Magda Kiel, PT Acute Rehabilitation Services [phone] Office:[phone] 07/21/2020

## 2020-07-21 NOTE — Progress Notes (Addendum)
Wanda [name]  BWG:[id number] DOB: Sep 30, 1961 DOA: 07/28/2020 PCP: Lorene Dy, MD     Brief Narrative:  [phone] with a history of PAF on Eliquis, CKD stage IV status post renal transplant, anemia of CKD, gastric sleeve, hypoalbuminemia, DM2, HTN, hypothyroidism, GERD, PAD, and chronic neuropathy who was admitted to the hospital 05/01/2020-06/01/2020 in septic shock secondary to a R foot ulcer with Klebsiella bacteremia complicated by endocarditis.  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  She returned to the ED 10/5 with a 1 week history of drainage and dehiscence of the stump site.  In the ED the patient was found to have an elevated WBC.  X-ray right tibia/fibula was without evidence of osseous abnormality.  She was found to be hyperkalemic at 6.0 and in acute renal failure with creatinine 3.4 up from baseline of 2.4.    Significant Events:  7/30 > 8/30 admit right foot cellulitis and Klebsiella bacteremia > R BKA 10/5 admit w/ AKI, hyperkalemia, and wound dehiscence  10/12 to OR for wound revision   Antimicrobials:  none  DVT prophylaxis: Eliquis  Subjective: Patient complains of pain in her buttock/rectal area from all the enemas and manual disimpaction was attempted yesterday.  Denies any abdominal pain.  Continues to have poor appetite.  No nausea reported this morning.   Assessment & Plan:  Severe constipation with nausea and anorexia Patient had significant nausea 2 days ago with 1 episode of vomiting.  Abdominal x-ray showed significant stool burden.  This is likely in the setting of narcotic use for pain in her right lower extremity.  Patient given laxatives and suppository with no significant relief.  Manual disimpaction was attempted yesterday.  We will try smog enema today.   Patient's thyroid function tests were reviewed.  These were done earlier this month.  TSH was 23.7 however free T4 was  0.82 which was normal.  Free T3 is noted to be low at 0.8. This could also be contributing to her constipation.  Will place the patient on thyroid replacement.  Acute urinary retention Patient noted to have greater than 400 mL of urine in her bladder overnight.  And in and out catheterization had to be done.  Continue with frequent bladder scans.  UA noted to be abnormal.  Culture ordered.  Patient already on ciprofloxacin. . R BKA stump site dehiscence/wound infection/questionable history of endocarditis X-ray without evidence to suggest osteomyelitis. She finished a prolonged course of IV meropenem 9/25.  Blood cultures without any growth so far. Ortho performed irrigation and excisional debridement of right leg wound w/ intermediate closure 10/12. She will be nonweightbearing on the right lower extremity. Gram stain of the deep tissue cultures show gram-negative rods along with GPC.  Wound cultures grew out Klebsiella.  Infectious disease was consulted due to patient's previous history.   Patient started on cefepime.  Patient underwent echocardiogram which showed density on the mitral valve.  Cardiology was consulted.  They did not feel that this is a vegetation.  They say that this likely represents a segment of redundant mitral subvalvular patterns.  They did not recommend a TEE at this time.  Based on ID recommendations patient now on Cipro and Flagyl.  To get 10-day treatment.  Cefepime was given on 10/14.  Last day of antibiotics will be 10/24.  Vertigo Seems to have resolved.  Continue meclizine.  She will need to follow-up with  ENT for her ear issues.    Recent COVID Diagnosed with Covid 9/10 while at [facility] and quarantined for 14 days. Last day of isolation was 10/1. CXR notes resolved bilateral lung opacities compared to previous exam Patient has been asymptomatic.  Acute kidney injury on CKD stage IV status post renal transplant/metabolic acidosis Baseline  creatinine 2.1-2.4 Some worsening in her creatinine noted over the last several days.  Nephrology was consulted.  They did not feel that this is a significant deviation from her baseline.  They are also satisfied with her potassium less than 6.   Bicarbonate is stable.  Patient remains on oral bicarbonate. Creatinine continues to rise slowly.  Likely due to poor oral intake.  She is on D5 infusion currently due to hypoglycemia.  We will increase the rate of IV fluids today.  Monitor urine output.  Urinary retention as noted above could also be contributing. Hypernatremia present on and off. She is followed by Dr. Posey Pronto at [facility].  Noted to be on tacrolimus and prednisone. US renal ordered due to rising creatinine.  Hyperkalemia Nephrology satisfied with her potassium level less than 6.  Continue Lokelma.  Patient noted to be on Veltassa at home.  Potassium has improved.  Hypotension Likely due to volume depletion. Resolved with volume resuscitation.  Cosyntropin stimulation test shows adequate response.  Paroxysmal atrial fibrillation Continue Eliquis and amiodarone.  Currently in sinus rhythm.  History of type 2 diabetes/hypoglycemia Does not require home medical therapy.  Hypoglycemic episodes most likely due to poor oral intake.  Cosyntropin stimulation test results do not suggest adrenal insufficiency. Currently on a D5 infusion.  0.2.    Hypothyroidism Reported history of such but is not on thyroid hormone replacement. TSH was 23.7 however free T4 was 0.82 which was normal.  Free T3 is noted to be low at 0.8. This could also be contributing to her constipation.  Will place the patient on thyroid replacement. Suggest recheck of TSH in 6-8 weeks   Normocytic anemia Likely due to chronic disease.  No overt bleeding noted.  Hemoglobin is stable.  Severe malnutrition in context of chronic illness Nutrition consulted  DVT prophylaxis: Eliquis Code Status: FULL  CODE Family Communication: Discussed with the patient.  No family at bedside Disposition: Patient declines SNF.  Wants to go home.  Will need home health.  Status is: Inpatient  Remains inpatient appropriate because:Persistent severe electrolyte disturbances   Dispo: The patient is from: SNF              Anticipated d/c is to: Home              Anticipated d/c date is: 10/19              Patient currently is not medically stable to d/c.   Consultants:  Orthopedics Infectious disease Cardiology  Objective: Blood pressure 100/60, pulse 86, temperature 97.9 F (36.6 C), resp. rate 17, height 5\' 9"  (1.753 m), weight 50.9 kg, SpO2 99 %.  Intake/Output Summary (Last 24 hours) at 07/21/2020 1021 Last data filed at 07/21/2020 0746 Gross per 24 hour  Intake 423.71 ml  Output 841 ml  Net -417.29 ml   Filed Weights   07/08/20 1130  Weight: 50.9 kg    Examination:  General appearance: Awake alert.  In no distress Resp: Clear to auscultation bilaterally.  Normal effort Cardio: S1-S2 is normal regular.  No S3-S4.  No rubs murmurs or bruit GI: Abdomen is soft.  Nontender nondistended.  Bowel sounds are present normal.  No masses organomegaly Inspection of the rectal/anal area did not show any bleeding.  Liquid stool was noted.  No other lesions appreciated.  Area was quite tender when palpated.  No fluctuant masses. Neurologic: Alert and oriented x3.  No focal neurological deficits.        CBC: Recent Labs  Lab 07/19/20 0059 07/20/20 0412 07/21/20 0559  WBC 11.3* 8.8 11.8*  HGB 10.3* 8.9* 9.5*  HCT 33.0* 28.5* 29.2*  MCV 93.0 91.3 90.7  PLT 165 139* 852   Basic Metabolic Panel: Recent Labs  Lab 07/19/20 0059 07/20/20 0412 07/21/20 0559  NA 145 147* 145  K 5.4* 5.2* 5.0  CL 118* 119* 119*  CO2 18* 19* 18*  GLUCOSE 79 65* 114*  BUN 62* 64* 67*  CREATININE 3.36* 3.35* 3.59*  CALCIUM 8.4* 8.1* 8.3*   GFR: Estimated Creatinine Clearance: 13.6 mL/min (A) (by  C-G formula based on SCr of 3.59 mg/dL (H)).  Liver Function Tests: Recent Labs  Lab 07/19/20 0059  AST 27  ALT 6  ALKPHOS 414*  BILITOT 0.6  PROT 5.4*  ALBUMIN 1.2*    HbA1C: Hgb A1c MFr Bld  Date/Time Value Ref Range Status  07/05/2020 04:12 AM 4.2 (L) 4.8 - 5.6 % Final    Comment:    (NOTE) Pre diabetes:          5.7%-6.4%  Diabetes:              >6.4%  Glycemic control for   <7.0% adults with diabetes   05/02/2020 01:06 AM 4.1 (L) 4.8 - 5.6 % Final    Comment:    (NOTE) Pre diabetes:          5.7%-6.4%  Diabetes:              >6.4%  Glycemic control for   <7.0% adults with diabetes     CBG: Recent Labs  Lab 07/20/20 0856 07/20/20 1116 07/20/20 1134 07/20/20 1656 07/20/20 2129  GLUCAP 66* 66* 151* 94 101*    Recent Results (from the past 240 hour(s))  Aerobic/Anaerobic Culture (surgical/deep wound)     Status: None   Collection Time: 07/07/2020  1:46 PM   Specimen: PATH Soft tissue  Result Value Ref Range Status   Specimen Description TISSUE RIGHT LEG  Final   Special Requests DEEP SPEC A  Final   Gram Stain   Final    FEW WBC PRESENT,BOTH PMN AND MONONUCLEAR RARE GRAM POSITIVE COCCI RARE GRAM NEGATIVE RODS Performed at [facility], [place] [address] [place], [place] [address]    Culture   Final    FEW KLEBSIELLA PNEUMONIAE FEW ENTEROBACTER CLOACAE FEW PSEUDOMONAS AERUGINOSA MODERATE BACTEROIDES FRAGILIS    Report Status 07/17/2020 FINAL  Final   Organism ID, Bacteria KLEBSIELLA PNEUMONIAE  Final   Organism ID, Bacteria ENTEROBACTER CLOACAE  Final   Organism ID, Bacteria PSEUDOMONAS AERUGINOSA  Final      Susceptibility   Enterobacter cloacae - MIC*    CEFAZOLIN >=64 RESISTANT Resistant     CEFEPIME <=0.12 SENSITIVE Sensitive     CEFTAZIDIME <=1 SENSITIVE Sensitive     CIPROFLOXACIN <=0.25 SENSITIVE Sensitive     GENTAMICIN <=1 SENSITIVE Sensitive     IMIPENEM 0.5 SENSITIVE Sensitive     TRIMETH/SULFA <=20 SENSITIVE Sensitive      PIP/TAZO <=4 SENSITIVE Sensitive     * FEW ENTEROBACTER CLOACAE   Klebsiella pneumoniae - MIC*  AMPICILLIN RESISTANT Resistant     CEFAZOLIN <=4 SENSITIVE Sensitive     CEFEPIME <=0.12 SENSITIVE Sensitive     CEFTAZIDIME <=1 SENSITIVE Sensitive     CEFTRIAXONE <=0.25 SENSITIVE Sensitive     CIPROFLOXACIN <=0.25 SENSITIVE Sensitive     GENTAMICIN <=1 SENSITIVE Sensitive     IMIPENEM 0.5 SENSITIVE Sensitive     TRIMETH/SULFA <=20 SENSITIVE Sensitive     AMPICILLIN/SULBACTAM <=2 SENSITIVE Sensitive     PIP/TAZO <=4 SENSITIVE Sensitive     * FEW KLEBSIELLA PNEUMONIAE   Pseudomonas aeruginosa - MIC*    CEFTAZIDIME 4 SENSITIVE Sensitive     CIPROFLOXACIN <=0.25 SENSITIVE Sensitive     GENTAMICIN <=1 SENSITIVE Sensitive     IMIPENEM 1 SENSITIVE Sensitive     PIP/TAZO 8 SENSITIVE Sensitive     CEFEPIME 2 SENSITIVE Sensitive     * FEW PSEUDOMONAS AERUGINOSA     Scheduled Meds: . amiodarone  200 mg Oral Daily  . apixaban  2.5 mg Oral BID  . ascorbic acid  250 mg Oral BID  . cholecalciferol  1,000 Units Oral Daily  . ciprofloxacin  500 mg Oral Daily  . docusate sodium  100 mg Oral BID  . feeding supplement (NEPRO CARB STEADY)  237 mL Oral TID BM  . folic acid  1 mg Oral Daily  . hydrocortisone   Rectal TID  . meclizine  12.5 mg Oral TID  . metroNIDAZOLE  500 mg Oral Q8H  . mirtazapine  7.5 mg Oral QHS  . multivitamin with minerals  1 tablet Oral Daily  . nortriptyline  25 mg Oral QHS  . pantoprazole  40 mg Oral Daily  . polyethylene glycol  17 g Oral BID  . predniSONE  5 mg Oral Daily  . ramelteon  8 mg Oral QHS  . senna  1 tablet Oral BID  . sodium bicarbonate  1,300 mg Oral TID  . sodium zirconium cyclosilicate  10 g Oral BID  . sorbitol, milk of mag, mineral oil, glycerin (SMOG) enema  960 mL Rectal Once  . tacrolimus  1.5 mg Oral Q12H  . thiamine  100 mg Oral Daily  . zinc sulfate  220 mg Oral Daily   Continuous Infusions: . dextrose 30 mL/hr at 07/20/20 1132      LOS: 13 days   [facility] Office  [phone] Pager - Text Page per Shea Evans  If 7PM-7AM, please contact night-coverage per Amion 07/21/2020, 10:21 AM

## 2020-07-21 NOTE — Plan of Care (Signed)

## 2020-07-21 NOTE — Progress Notes (Signed)
Admit: 07/21/2020 LOS: 74  32F s/p KT 2020 with CKD4T Tac/Pred; admitted with dehisced and infected recent right BKA now on broad-spectrum antibiotics; persistent hyperkalemia and metabolic acidosis.  Subjective:  . No complaints this AM. No acute events. Thomos Lemons today . Creatinine stable this AM 3.6. UOP recorded as 0.4L with an unmeasured void . No uremic symptoms  10/18 0701 - 10/19 0700 In: 423.7 [P.O.:120; I.V.:103.7; NG/GT:200] Out: 441 [Urine:441]  Filed Weights   07/08/20 1130  Weight: 50.9 kg    Scheduled Meds: . amiodarone  200 mg Oral Daily  . apixaban  2.5 mg Oral BID  . ascorbic acid  250 mg Oral BID  . cholecalciferol  1,000 Units Oral Daily  . ciprofloxacin  500 mg Oral Daily  . docusate sodium  100 mg Oral BID  . feeding supplement (NEPRO CARB STEADY)  237 mL Oral TID BM  . folic acid  1 mg Oral Daily  . hydrocortisone   Rectal TID  . levothyroxine  25 mcg Oral Q0600  . meclizine  12.5 mg Oral TID  . metroNIDAZOLE  500 mg Oral Q8H  . mirtazapine  7.5 mg Oral QHS  . multivitamin with minerals  1 tablet Oral Daily  . nortriptyline  25 mg Oral QHS  . pantoprazole  40 mg Oral Daily  . polyethylene glycol  17 g Oral BID  . predniSONE  5 mg Oral Daily  . ramelteon  8 mg Oral QHS  . senna  1 tablet Oral BID  . sodium bicarbonate  1,300 mg Oral TID  . sodium zirconium cyclosilicate  10 g Oral BID  . sorbitol, milk of mag, mineral oil, glycerin (SMOG) enema  960 mL Rectal Once  . tacrolimus  1.5 mg Oral Q12H  . thiamine  100 mg Oral Daily  . zinc sulfate  220 mg Oral Daily   Continuous Infusions: . dextrose     PRN Meds:.acetaminophen, alum & mag hydroxide-simeth, dextrose, dicyclomine, diphenhydrAMINE, diphenhydrAMINE, docusate sodium, naphazoline-glycerin, ondansetron **OR** ondansetron (ZOFRAN) IV, oxyCODONE, traMADol  Current Labs: reviewed   Physical Exam:  Blood pressure 100/60, pulse 86, temperature 97.9 F (36.6 C), resp. rate 17, height 5\' 9"   (1.753 m), weight 50.9 kg, SpO2 99 %. GEN: chronically ill appearing, NAD ENT: temporal wasting EYES: EOMI CV: Regular, nl s1s2 PULM: CTAB ABD: soft, nt/nd SKIN: RLE bandagd EXT:No sig LLE edema.  Unna boot  A 1. CKD 4T: Probably at her baseline, I am not sure there is an acute component.  Continue current transplant regimen. Follows with Dr. Elmarie Shiley 2. Hyperkalemia: Likely from a type IV RTA and low GFR; stable if K is less than 6, continue twice daily lokelma, restrict K in diet, see #3 3. Chronic metabolic acidosis, from type IV RTA, increased sodium bicarbonate to 1300 mg 3 times weekly, CTM 4. 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 DM2 6. AFib 7. Anemia, Hb lower today, mgmt per primary service 8. HTN, BP stable  P . Cont supportive care, PT/OT . Labs reviewed, renal function stable. No indication for renal replacement therapy . Trend K and HCO3. K & bicarb stable today . Continue outpatient immunosuppression of tacrolimus and prednisone. Tac level sent today . Medication Issues; o Preferred narcotic agents for pain control are hydromorphone, fentanyl, and methadone. Morphine should not be used.  o Baclofen should be avoided o Avoid oral sodium phosphate and magnesium citrate based laxatives / bowel preps  Gean Quint, MD [facility] 07/21/2020, 11:06 AM  Recent Labs  Lab 07/19/20 0059 07/20/20 0412 07/21/20 0559  NA 145 147* 145  K 5.4* 5.2* 5.0  CL 118* 119* 119*  CO2 18* 19* 18*  GLUCOSE 79 65* 114*  BUN 62* 64* 67*  CREATININE 3.36* 3.35* 3.59*  CALCIUM 8.4* 8.1* 8.3*   Recent Labs  Lab 07/19/20 0059 07/20/20 0412 07/21/20 0559  WBC 11.3* 8.8 11.8*  HGB 10.3* 8.9* 9.5*  HCT 33.0* 28.5* 29.2*  MCV 93.0 91.3 90.7  PLT 165 139* 230

## 2020-07-22 DIAGNOSIS — T8130XA Disruption of wound, unspecified, initial encounter: Secondary | ICD-10-CM | POA: Diagnosis not present

## 2020-07-22 LAB — BASIC METABOLIC PANEL
Anion gap: 9 (ref 5–15)
BUN: 67 mg/dL — ABNORMAL HIGH (ref 6–20)
CO2: 19 mmol/L — ABNORMAL LOW (ref 22–32)
Calcium: 8 mg/dL — ABNORMAL LOW (ref 8.9–10.3)
Chloride: 115 mmol/L — ABNORMAL HIGH (ref 98–111)
Creatinine, Ser: 3.68 mg/dL — ABNORMAL HIGH (ref 0.44–1.00)
GFR, Estimated: 13 mL/min — ABNORMAL LOW (ref >60)
Glucose, Bld: 189 mg/dL — ABNORMAL HIGH (ref 70–99)
Potassium: 4.2 mmol/L (ref 3.5–5.1)
Sodium: 143 mmol/L (ref 135–145)

## 2020-07-22 LAB — GLUCOSE, CAPILLARY
Glucose-Capillary: 160 mg/dL — ABNORMAL HIGH (ref 70–99)
Glucose-Capillary: 134 mg/dL — ABNORMAL HIGH (ref 70–99)
Glucose-Capillary: 149 mg/dL — ABNORMAL HIGH (ref 70–99)
Glucose-Capillary: 149 mg/dL — ABNORMAL HIGH (ref 70–99)

## 2020-07-22 MED ORDER — SENNOSIDES-DOCUSATE SODIUM 8.6-50 MG PO TABS
1.0000 | ORAL_TABLET | Freq: Two times a day (BID) | ORAL | Status: DC
  Administered 2020-07-22 – 2020-07-24 (×4): 1 via ORAL
  Filled 2020-07-22 (×3): qty 1

## 2020-07-22 MED ORDER — LACTATED RINGERS IV SOLN: INTRAVENOUS | Status: AC

## 2020-07-22 MED ORDER — OXYCODONE HCL 5 MG PO TABS
5.0000 mg | ORAL_TABLET | Freq: Four times a day (QID) | ORAL | Status: DC | PRN
  Administered 2020-07-22 – 2020-07-27 (×7): 5 mg via ORAL
  Filled 2020-07-22 (×8): qty 1

## 2020-07-22 MED ORDER — NYSTATIN 100000 UNIT/GM EX POWD
Freq: Two times a day (BID) | CUTANEOUS | Status: DC
  Filled 2020-07-22: qty 15

## 2020-07-22 NOTE — Progress Notes (Signed)
Admit: 07/30/2020 LOS: 30  58F s/p KT 2020 with CKD4T Tac/Pred; admitted with dehisced and infected recent right BKA now on broad-spectrum antibiotics; persistent hyperkalemia and metabolic acidosis.  Subjective:  . Feels dehydrated, hasn't eating a good meal in quite some time. No new symptoms otherwise. No acute events overnight  10/19 0701 - 10/20 0700 In: 2800 [P.O.:240; I.V.:1079; NG/GT:237] Out: 800 [Urine:800]  Filed Weights   07/08/20 1130  Weight: 50.9 kg    Scheduled Meds: . amiodarone  200 mg Oral Daily  . apixaban  2.5 mg Oral BID  . ascorbic acid  250 mg Oral BID  . cholecalciferol  1,000 Units Oral Daily  . ciprofloxacin  500 mg Oral Daily  . docusate sodium  100 mg Oral BID  . feeding supplement (NEPRO CARB STEADY)  237 mL Oral TID BM  . folic acid  1 mg Oral Daily  . hydrocortisone   Rectal TID  . levothyroxine  25 mcg Oral Q0600  . meclizine  12.5 mg Oral TID  . metroNIDAZOLE  500 mg Oral Q8H  . mirtazapine  7.5 mg Oral QHS  . multivitamin with minerals  1 tablet Oral Daily  . nortriptyline  25 mg Oral QHS  . pantoprazole  40 mg Oral Daily  . polyethylene glycol  17 g Oral BID  . predniSONE  5 mg Oral Daily  . ramelteon  8 mg Oral QHS  . senna  1 tablet Oral BID  . sodium bicarbonate  1,300 mg Oral TID  . sodium zirconium cyclosilicate  10 g Oral BID  . tacrolimus  1.5 mg Oral Q12H  . thiamine  100 mg Oral Daily  . zinc sulfate  220 mg Oral Daily   Continuous Infusions:  PRN Meds:.acetaminophen, alum & mag hydroxide-simeth, dextrose, dicyclomine, diphenhydrAMINE, diphenhydrAMINE, docusate sodium, naphazoline-glycerin, ondansetron **OR** ondansetron (ZOFRAN) IV, oxyCODONE, traMADol  Current Labs: reviewed   Physical Exam:  Blood pressure (!) 117/54, pulse 81, temperature (!) 97.4 F (36.3 C), temperature source Axillary, resp. rate 18, height 5\' 9"  (1.753 m), weight 50.9 kg, SpO2 97 %. GEN: chronically ill appearing, NAD ENT: temporal wasting EYES:  EOMI CV: Regular, nl s1s2 PULM: CTAB ABD: soft, nt/nd SKIN: RLE bandagd EXT:No sig LLE edema.  Unna boot  A 1. CKD 4T: Probably at her baseline, I am not sure there is an acute component.  Continue current transplant regimen. Follows with Dr. Elmarie Shiley 2. Hyperkalemia: Likely from a type IV RTA and low GFR; stable if K is less than 6, continue twice daily lokelma, restrict K in diet, see #3 3. 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 DM2 6. AFib 7. Anemia, Hb lower today, mgmt per primary service 8. HTN, BP stable 9. Constipation with n/v 10. Urinary retention  P . Cont supportive care . Relatively dry on exam and feels dehydrated, would recommend 1L of isotonic fluids over 10 hours, if doing okay from a volume standpoint can get another 1L slowly . Continue serial bladder scans, if consistently retaining then would recommend a foley placement . Labs reviewed, renal function stable. No indication for renal replacement therapy . Tac trough drawn 10/19, pending . Trend K and HCO3. K & bicarb stable today . Continue outpatient immunosuppression of tacrolimus and prednisone. Transplant ultrasound reviewed . Medication Issues; o Preferred narcotic agents for pain control are hydromorphone, fentanyl,  and methadone. Morphine should not be used.  o Baclofen should be avoided o Avoid oral sodium phosphate and magnesium citrate based laxatives / bowel preps    Gean Quint, MD [facility] 07/22/2020, 11:36 AM  Recent Labs  Lab 07/20/20 0412 07/21/20 0559 07/22/20 0338  NA 147* 145 143  K 5.2* 5.0 4.2  CL 119* 119* 115*  CO2 19* 18* 19*  GLUCOSE 65* 114* 189*  BUN 64* 67* 67*  CREATININE 3.35* 3.59* 3.68*  CALCIUM 8.1* 8.3* 8.0*   Recent Labs  Lab 07/19/20 0059 07/20/20 0412 07/21/20 0559  WBC 11.3* 8.8  11.8*  HGB 10.3* 8.9* 9.5*  HCT 33.0* 28.5* 29.2*  MCV 93.0 91.3 90.7  PLT 165 139* 230

## 2020-07-22 NOTE — Progress Notes (Addendum)
PROGRESS NOTE  Wanda [name]  DOB: 07/14/1961  PCP: Lorene Dy, MD WVP:[id number]  DOA: 07/04/2020  LOS: 14 days   Chief Complaint  Patient presents with  . Wound Dehiscence    Brief narrative: Wanda [name] is a 59 y.o. female with PMH of kidney transplant 2020 with CKD 4T Tac/Pred, chronic anemia, gastric sleeve, GERD, hypoalbuminemia, DM2, HTN, PAD, hypothyroidism and chronic neuropathy. Patient presented to the ED on 07/28/2020 with wound dehiscence of recent right BKA stump. Patient recently had a prolonged hospitalization 05/01/2020-06/01/2020 in septic shock secondary to a R foot ulcer with Klebsiella bacteremia complicated by endocarditis.  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  Per ID recommendation, patient completed 6 weeks of IV meropenem on 9/25.  She returned to the ED 10/5 with a 1 week history of drainage and dehiscence of the stump site.  In the ED the patient was found to have an elevated WBC.   X-ray right tibia/fibula was without evidence of osseous abnormality.   She was found to be hyperkalemic at 6.0 and in acute renal failure with creatinine 3.4 up from baseline of 2.4.   She was admitted to hospitalist service. 10/12 -she underwent wound revision in the OR.  Her hospitalization was prolonged because of progressive renal failure, persistent hyperkalemia, metabolic acidosis and severe constipation.  Subjective: Patient was seen and examined this morning.  Pleasant middle-aged African-American female.  Lying down in bed.  Not in distress.  Complains of dizziness for last few days.  Assessment/Plan: R BKA stump site dehiscence/wound infection -X-ray without evidence to suggest osteomyelitis. Blood cultures without any growth so far. -10/12, Ortho performed irrigation and excisional debridement of right leg wound w/ intermediate closure. -Wound cultures grew out Klebsiella. Based on ID recommendations patient is now on Cipro and Flagyl. Last day of antibiotics  will be 10/24. -She is currently in nonweightbearing status on the right lower extremity.  Recent history of endocarditis  -Patient recently completed 6 weeks course of IV meropenem on 9/25.  Repeat blood culture on this admission was negative.  -Repeat echocardiogram on this admission on 10/14 showed an echogenic mobile mass on the mitral valve.  Per cardiology, this is not a vegetation and likely likely represents a segment of redundant mitral subvalvular patterns.  TEE was not recommended.  Acute kidney injury h/o kidney transplant 2020 with CKD4 on transplanted kidney -Baseline creatinine 2.1-2.4 -Creatinine trending up, 3.68 today.  Nephrology following. -Noted a plan from nephrology to do start on LR today. -Continue tacrolimus and prednisone as before. 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 -Continue sodium bicarb 1300 mg 3 times daily.  Hyperkalemia -Gradually improving.  In normal range today. -Continue twice daily Lokelma, restrict potassium in diet. Recent Labs  Lab 07/18/20 0221 07/19/20 0059 07/20/20 0412 07/21/20 0559 07/22/20 0338  K 5.6* 5.4* 5.2* 5.0 4.2   Hypotension -Secondary to volume depletion. -With IV resuscitation, blood pressure has improved. -Cosyntropin stimulation test shows adequate response.  Severe constipation with nausea and anorexia -10/17, abdominal x-ray with diffuse stool throughout the colon.  No evidence of bowel obstruction or free air.   -10/19, patient had bowel  movement with enema.   -Continue Senokot twice daily and MiraLAX twice daily.  Hypothyroidism -Reported history of such but is not on  thyroid hormone replacement.  -10/6, TSH elevated 24 but free T4 was normal.  Patient was initiated on thyroid supplement which I will continue. -TSH needs to be checked in 6 to 8 weeks.  Acute urinary retention -10/19, patient was noted to have greater than 400 mL of urine in her bladder overnight.  And in and out catheterization was done.   -On bladder scan this morning, she had only 20 mL remaining.  Probably oliguric from AKI.  IV fluids initiated by nephrology.  Continue bladder scan.  May need Foley catheter if continues to retain.  Vertigo -Seems to have resolved.  Continue meclizine.  She will need to follow-up with ENT for her ear issues.    Recent COVID -Diagnosed with Covid 9/10 while at [facility] and quarantined for 14 days. Last day of isolation was 10/1.  -Chest x-ray 10/5 noted resolved bilateral lung opacities.  Respiratory status is stable.  Paroxysmal atrial fibrillation Continue amiodarone and Eliquis. Currently in sinus rhythm.  History of type 2 diabetes Hypoglycemic episodes -A1c 4.2 on 10/7.  Not on diabetes medications at home. -Cosyntropin stimulation test results do not suggest adrenal insufficiency. Recent Labs  Lab 07/20/20 1656 07/20/20 2129 07/21/20 2122 07/22/20 0729 07/22/20 1238  GLUCAP 94 101* 156* 160* 134*   Chronic normocytic anemia -Chronic anemia likely due to chronic disease.  No overt bleeding noted.  Hemoglobin is stable.  Severe malnutrition in context of chronic illness Nutrition consulted. Note as below.  Pressure ulcer -Patient is 1cm by 1cm blister on the sacrum and a blister on Lt  thigh and heel that's healing and scabbed over. -See wound care note.  Mobility: Patient has not been participating with physical therapy.  Patient prefers going home versus SNF. Code Status:   Code Status: Full Code  Nutritional status: Body mass index is 16.57 kg/m. Nutrition Problem: Severe Malnutrition Etiology: chronic  illness Signs/Symptoms: energy intake < or equal to 75% for > or equal to 1 month, percent weight loss Percent weight loss: 30 % Diet Order            Diet regular Room service appropriate? No; Fluid consistency: Thin  Diet effective now                 DVT prophylaxis: apixaban (ELIQUIS) tablet 2.5 mg Start: 07/15/20 1315 SCDs Start: 07/09/2020 1530 apixaban (ELIQUIS) tablet 2.5 mg   Antimicrobials:  Cipro and Flagyl till 10/24. Fluid: LR at 100 mill per hour Consultants: Nephrology Family Communication:  None at bedside  Status is: Inpatient  Remains inpatient appropriate because of hypotension, AKI  Dispo:  Patient From: [facility]  Planned Disposition: Home  Expected discharge date: 07/23/20  Medically stable for discharge: No        Infusions:  . lactated ringers 100 mL/hr at 07/22/20 1344    Scheduled Meds: . amiodarone  200 mg Oral Daily  . apixaban  2.5 mg Oral BID  . ascorbic acid  250 mg Oral BID  . cholecalciferol  1,000 Units Oral Daily  . ciprofloxacin  500 mg Oral Daily  . feeding supplement (NEPRO CARB STEADY)  237 mL Oral TID BM  . folic acid  1 mg Oral Daily  . hydrocortisone   Rectal TID  . levothyroxine  25 mcg Oral Q0600  . meclizine  12.5 mg Oral TID  .  metroNIDAZOLE  500 mg Oral Q8H  . mirtazapine  7.5 mg Oral QHS  . multivitamin with minerals  1 tablet Oral Daily  . nortriptyline  25 mg Oral QHS  . pantoprazole  40 mg Oral Daily  . polyethylene glycol  17 g Oral BID  . predniSONE  5 mg Oral Daily  . ramelteon  8 mg Oral QHS  . senna-docusate  1 tablet Oral BID  . sodium bicarbonate  1,300 mg Oral TID  . sodium zirconium cyclosilicate  10 g Oral BID  . tacrolimus  1.5 mg Oral Q12H  . thiamine  100 mg Oral Daily  . zinc sulfate  220 mg Oral Daily    Antimicrobials: Anti-infectives (From admission, onward)   Start     Dose/Rate Route Frequency Ordered Stop   07/18/20 1100  ciprofloxacin (CIPRO) tablet 500 mg         500 mg Oral Daily 07/18/20 1005     07/17/20 1400  metroNIDAZOLE (FLAGYL) tablet 500 mg        500 mg Oral Every 8 hours 07/17/20 1307     07/16/20 1600  ceFEPIme (MAXIPIME) 2 g in sodium chloride 0.9 % 100 mL IVPB  Status:  Discontinued        2 g 200 mL/hr over 30 Minutes Intravenous Every 24 hours 07/16/20 1406 07/18/20 0922   07/15/20 0600  ceFAZolin (ANCEF) IVPB 2g/100 mL premix        2 g 200 mL/hr over 30 Minutes Intravenous On call to O.R. 07/13/2020 1210 07/07/2020 1335   07/27/2020 1347  vancomycin (VANCOCIN) powder  Status:  Discontinued          As needed 07/10/2020 1356 07/04/2020 1413   07/22/2020 1213  ceFAZolin (ANCEF) 2-4 GM/100ML-% IVPB       Note to Pharmacy: Henrine Screws   : cabinet override      08/01/2020 1213 07/06/2020 1344      PRN meds: acetaminophen, alum & mag hydroxide-simeth, dextrose, dicyclomine, diphenhydrAMINE, diphenhydrAMINE, naphazoline-glycerin, ondansetron **OR** ondansetron (ZOFRAN) IV, oxyCODONE, traMADol   Objective: Vitals:   07/21/20 2118 07/22/20 0414  BP: (!) 96/46 (!) 117/54  Pulse: 85 81  Resp: 18 18  Temp: (!) 97.2 F (36.2 C) (!) 97.4 F (36.3 C)  SpO2: 98% 97%    Intake/Output Summary (Last 24 hours) at 07/22/2020 1402 Last data filed at 07/22/2020 0650 Gross per 24 hour  Intake 1556.01 ml  Output 400 ml  Net 1156.01 ml   Filed Weights   07/08/20 1130  Weight: 50.9 kg   Weight change:  Body mass index is 16.57 kg/m.   Physical Exam: General exam: Appears calm and comfortable.  Not in physical distress Skin: No rashes, lesions or ulcers. HEENT: Atraumatic, normocephalic, supple neck, no obvious bleeding Lungs: Clear to auscultation bilaterally CVS: Regular rate and rhythm, no murmur GI/Abd soft, nontender, nondistended, bowel sound present CNS: Alert, awake, oriented x3 Psychiatry: mood appropriate Extremities: Bandaged wounds bilaterally.  Has BKA wound on the right.  Data Review: I have personally reviewed the laboratory  data and studies available.  Recent Labs  Lab 07/17/20 0250 07/18/20 0221 07/19/20 0059 07/20/20 0412 07/21/20 0559  WBC 12.4* 11.9* 11.3* 8.8 11.8*  HGB 10.5* 9.7* 10.3* 8.9* 9.5*  HCT 33.3* 30.3* 33.0* 28.5* 29.2*  MCV 90.5 92.4 93.0 91.3 90.7  PLT 146* 150 165 139* 230   Recent Labs  Lab 07/18/20 0221 07/19/20 0059 07/20/20 0412 07/21/20 0559 07/22/20 0338  NA 144 145  147* 145 143  K 5.6* 5.4* 5.2* 5.0 4.2  CL 118* 118* 119* 119* 115*  CO2 16* 18* 19* 18* 19*  GLUCOSE 139* 79 65* 114* 189*  BUN 60* 62* 64* 67* 67*  CREATININE 3.21* 3.36* 3.35* 3.59* 3.68*  CALCIUM 8.1* 8.4* 8.1* 8.3* 8.0*    F/u labs ordered.  Signed, Terrilee Croak, MD [facility] 07/22/2020

## 2020-07-22 NOTE — Progress Notes (Signed)
PT Cancellation Note  Patient Details Name: Wanda [name] MRN: [id number] DOB: 1961-06-20   Cancelled Treatment:    Reason Eval/Treat Not Completed: Other (comment).  Pt actively trying to have a BM reporting she is in too much pain to participate in therapy at this time.  She reports her bottom wound is hurting (she is appropriately on an air bed).  She is agreeable for PT to check back, but we are nearing our three consecutive refusal sign off guideline.    Thanks,  Verdene Lennert, PT, DPT  Acute Rehabilitation [phone] pager #[phone] office       Wells Guiles B Eiliyah Reh 07/22/2020, 4:44 PM

## 2020-07-22 NOTE — Plan of Care (Signed)

## 2020-07-22 NOTE — Plan of Care (Signed)
  Problem: Nutrition: Goal: Adequate nutrition will be maintained Outcome: Not Progressing   

## 2020-07-22 NOTE — Progress Notes (Signed)
OT Cancellation Note  Patient Details Name: RYE DORADO MRN: [id number] DOB: [birth year]   Cancelled Treatment:    Reason Eval/Treat Not Completed: Patient declined, no reason specified;Pain limiting ability to participate. On OT entry, pt side lying and reporting too much pain to participate. Pt requesting pain meds - RN notified. Per RN, pt recently disimpacted yesterday but pt reports continued need for BM. Will re-attempt OT session as time allows.  Layla Maw 07/22/2020, 9:41 AM

## 2020-07-22 NOTE — Progress Notes (Signed)
Pt had rash on bottom MD notified new orders given for nystatin.

## 2020-07-23 DIAGNOSIS — L8992 Pressure ulcer of unspecified site, stage 2: Secondary | ICD-10-CM

## 2020-07-23 DIAGNOSIS — T8130XA Disruption of wound, unspecified, initial encounter: Secondary | ICD-10-CM | POA: Diagnosis not present

## 2020-07-23 LAB — CBC WITH DIFFERENTIAL/PLATELET
Abs Immature Granulocytes: 0.1 10*3/uL — ABNORMAL HIGH (ref 0.00–0.07)
Basophils Absolute: 0 10*3/uL (ref 0.0–0.1)
Basophils Relative: 0 %
Eosinophils Absolute: 0 10*3/uL (ref 0.0–0.5)
Eosinophils Relative: 0 %
HCT: 25.5 % — ABNORMAL LOW (ref 36.0–46.0)
Hemoglobin: 8.2 g/dL — ABNORMAL LOW (ref 12.0–15.0)
Immature Granulocytes: 1 %
Lymphocytes Relative: 8 %
Lymphs Abs: 1.3 10*3/uL (ref 0.7–4.0)
MCH: 29.3 pg (ref 26.0–34.0)
MCHC: 32.2 g/dL (ref 30.0–36.0)
MCV: 91.1 fL (ref 80.0–100.0)
Monocytes Absolute: 0.4 10*3/uL (ref 0.1–1.0)
Monocytes Relative: 3 %
Neutro Abs: 14.1 10*3/uL — ABNORMAL HIGH (ref 1.7–7.7)
Neutrophils Relative %: 88 %
Platelets: 130 10*3/uL — ABNORMAL LOW (ref 150–400)
RBC: 2.8 MIL/uL — ABNORMAL LOW (ref 3.87–5.11)
RDW: 21.2 % — ABNORMAL HIGH (ref 11.5–15.5)
WBC: 16 10*3/uL — ABNORMAL HIGH (ref 4.0–10.5)
nRBC: 0 % (ref 0.0–0.2)

## 2020-07-23 LAB — BASIC METABOLIC PANEL
Anion gap: 11 (ref 5–15)
BUN: 65 mg/dL — ABNORMAL HIGH (ref 6–20)
CO2: 18 mmol/L — ABNORMAL LOW (ref 22–32)
Calcium: 8.1 mg/dL — ABNORMAL LOW (ref 8.9–10.3)
Chloride: 113 mmol/L — ABNORMAL HIGH (ref 98–111)
Creatinine, Ser: 3.75 mg/dL — ABNORMAL HIGH (ref 0.44–1.00)
GFR, Estimated: 12 mL/min — ABNORMAL LOW (ref >60)
Glucose, Bld: 127 mg/dL — ABNORMAL HIGH (ref 70–99)
Potassium: 4.2 mmol/L (ref 3.5–5.1)
Sodium: 142 mmol/L (ref 135–145)

## 2020-07-23 LAB — URINE CULTURE

## 2020-07-23 LAB — GLUCOSE, CAPILLARY
Glucose-Capillary: 88 mg/dL (ref 70–99)
Glucose-Capillary: 110 mg/dL — ABNORMAL HIGH (ref 70–99)
Glucose-Capillary: 145 mg/dL — ABNORMAL HIGH (ref 70–99)
Glucose-Capillary: 125 mg/dL — ABNORMAL HIGH (ref 70–99)

## 2020-07-23 MED ORDER — ENSURE ENLIVE PO LIQD
237.0000 mL | Freq: Three times a day (TID) | ORAL | Status: DC
  Administered 2020-07-23 – 2020-07-28 (×16): 237 mL via ORAL

## 2020-07-23 MED ORDER — LACTATED RINGERS IV SOLN: INTRAVENOUS | Status: AC

## 2020-07-23 NOTE — Progress Notes (Signed)
Admit: 07/06/2020 LOS: 53  34F s/p KT 2020 with CKD4T Tac/Pred; admitted with dehisced and infected recent right BKA now on broad-spectrum antibiotics; persistent hyperkalemia and metabolic acidosis.  Subjective:  . Tolerating fluids, cr unchanged. Still does not have an appetite or eating and still feels dehydrated. Denies sob, swelling.  10/20 0701 - 10/21 0700 In: 1380.7 [P.O.:480; I.V.:426.7; NG/GT:474] Out: -   Filed Weights   07/08/20 1130  Weight: 50.9 kg    Scheduled Meds: . amiodarone  200 mg Oral Daily  . apixaban  2.5 mg Oral BID  . ascorbic acid  250 mg Oral BID  . cholecalciferol  1,000 Units Oral Daily  . ciprofloxacin  500 mg Oral Daily  . feeding supplement  237 mL Oral TID BM  . folic acid  1 mg Oral Daily  . hydrocortisone   Rectal TID  . levothyroxine  25 mcg Oral Q0600  . meclizine  12.5 mg Oral TID  . metroNIDAZOLE  500 mg Oral Q8H  . mirtazapine  7.5 mg Oral QHS  . multivitamin with minerals  1 tablet Oral Daily  . nortriptyline  25 mg Oral QHS  . nystatin   Topical BID  . pantoprazole  40 mg Oral Daily  . polyethylene glycol  17 g Oral BID  . predniSONE  5 mg Oral Daily  . ramelteon  8 mg Oral QHS  . senna-docusate  1 tablet Oral BID  . sodium bicarbonate  1,300 mg Oral TID  . sodium zirconium cyclosilicate  10 g Oral BID  . tacrolimus  1.5 mg Oral Q12H  . thiamine  100 mg Oral Daily  . zinc sulfate  220 mg Oral Daily   Continuous Infusions:  PRN Meds:.acetaminophen, alum & mag hydroxide-simeth, dextrose, dicyclomine, diphenhydrAMINE, diphenhydrAMINE, naphazoline-glycerin, ondansetron **OR** ondansetron (ZOFRAN) IV, oxyCODONE, traMADol  Current Labs: reviewed   Physical Exam:  Blood pressure 138/83, pulse 87, temperature 98 F (36.7 C), temperature source Axillary, resp. rate 18, height 5\' 9"  (1.753 m), weight 50.9 kg, SpO2 99 %. GEN: chronically ill appearing, NAD ENT: temporal wasting EYES: EOMI CV: Regular, nl s1s2 PULM: CTAB ABD:  soft, nt/nd SKIN: RLE bandagd EXT:No sig LLE edema.  Unna boot  A 1. CKD 4T: Probably at her baseline, Continue current transplant regimen. Follows with Dr. Elmarie Shiley 2. Hyperkalemia: Likely from a type IV RTA and low GFR; stable if K is less than 6, continue twice daily lokelma, restrict K in diet, see #3 3. 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 DM2 6. AFib 7. Anemia, Hb lower today, mgmt per primary service 8. HTN, BP stable 9. Constipation with n/v 10. Urinary retention  P . Cont supportive care . Still relatively dry.  We will continue isotonic fluids for now (for at least another liter) . Continue serial bladder scans, if consistently retaining then would recommend a foley placement . Labs reviewed, renal function stable. No indication for renal replacement therapy . Tac trough drawn 10/19, pending . Trend K and HCO3. K & bicarb stable today . Continue outpatient immunosuppression of tacrolimus and prednisone. Transplant ultrasound reviewed . Medication Issues; o Preferred narcotic agents for pain control are hydromorphone, fentanyl, and methadone. Morphine should not be used.  o Baclofen should be avoided o Avoid oral sodium phosphate and magnesium citrate based laxatives / bowel preps    Gean Quint,  MD [facility] 07/23/2020, 10:20 AM  Recent Labs  Lab 07/21/20 0559 07/22/20 0338 07/23/20 0153  NA 145 143 142  K 5.0 4.2 4.2  CL 119* 115* 113*  CO2 18* 19* 18*  GLUCOSE 114* 189* 127*  BUN 67* 67* 65*  CREATININE 3.59* 3.68* 3.75*  CALCIUM 8.3* 8.0* 8.1*   Recent Labs  Lab 07/20/20 0412 07/21/20 0559 07/23/20 0153  WBC 8.8 11.8* 16.0*  NEUTROABS  --   --  14.1*  HGB 8.9* 9.5* 8.2*  HCT 28.5* 29.2* 25.5*  MCV 91.3 90.7 91.1  PLT 139* 230 130*

## 2020-07-23 NOTE — Progress Notes (Signed)
Physical Therapy Treatment Patient Details Name: Wanda [name] MRN: [id number] DOB: [birth year] Today's Date: 07/23/2020    History of Present Illness Wanda [name] is a 59 y.o. 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  Pt will have ortho intervention for closure during this admission.  Pt with significant PMH of recent COVID infection (off of precautions), chronic PAF, DM2, hypothyroidism, essential HTN, PVD, gout, CKD IV, Kidney transplant R 2013.     PT Comments    Patient received in bed, willing to attempt therapies today. Allowed [place] to perform modified Dix-Hallpike in bed, unable to provoke nystagmus on either side but did experience some symptomatic dizziness on the left which resolved when she closed her eyes/started again when she opened them back up. Also demonstrates poor VOR reflexes as well as difficulty tracking objects vertically with eyes only. Would currently suspect possible L sided BPPV mixed with central causative factors for dizziness, but would need more testing to confirm findings. Unsure that she will be able to 100% tolerate BPPV interventions given that they do involve getting up to sitting position at EOB which has been very difficult for her recently due to pain, but will certainly continue to try to follow-up as able. Needed totalAx2 for rolling due to weakness and pain, had multiple liquid BMs this session. Left in bed positioned to comfort with all needs met. Continue to recommend SNF.     Follow Up Recommendations  SNF     Equipment Recommendations  Wheelchair (measurements PT);Wheelchair cushion (measurements PT);Hospital bed;Other (comment) (hoyer lift)    Recommendations for Other Services       Precautions / Restrictions Precautions Precautions: Fall Precaution Comments: R BKA Restrictions Weight Bearing Restrictions: Yes Other Position/Activity Restrictions: NWB R  LE    Mobility  Bed Mobility Overal bed mobility: Needs Assistance Bed Mobility: Rolling Rolling: Total assist;+2 for physical assistance         General bed mobility comments: very little initiation from patient with all bed level activities, cues for seqeuncing and using limbs to assist  Transfers                 General transfer comment: unable- pain from wounds on bottom  Ambulation/Gait             General Gait Details: unable   Stairs             Wheelchair Mobility    Modified Rankin (Stroke Patients Only)       Balance                                            Cognition Arousal/Alertness: Awake/alert Behavior During Therapy: WFL for tasks assessed/performed Overall Cognitive Status: No family/caregiver present to determine baseline cognitive functioning                                 General Comments: anxious with mobility, very limited due to pain and dizziness; heavy cues needed for functional problem solving and hand placement      Exercises      General Comments General comments (skin integrity, edema, etc.): unable to tolerate sitting at EOB to assess balance      Pertinent Vitals/Pain Pain Assessment: Faces Faces  Pain Scale: Hurts worst Pain Location: generalized and R LE, bottom Pain Descriptors / Indicators: Grimacing;Guarding;Sore;Aching Pain Intervention(s): Limited activity within patient's tolerance;Monitored during session;Repositioned    Home Living                      Prior Function            PT Goals (current goals can now be found in the care plan section) Acute Rehab PT Goals Patient Stated Goal: to get better PT Goal Formulation: With patient Time For Goal Achievement: 07/25/20 Potential to Achieve Goals: Fair Additional Goals Additional Goal #1: Pt will tolerate OOB to chair for 1 hour, VSS, minimal reports of dizziness. Progress towards PT goals: Not  progressing toward goals - comment (limited by pain/dizziness)    Frequency    Min 2X/week      PT Plan Current plan remains appropriate;Equipment recommendations need to be updated    Co-evaluation              AM-PAC PT "6 Clicks" Mobility   Outcome Measure  Help needed turning from your back to your side while in a flat bed without using bedrails?: Total Help needed moving from lying on your back to sitting on the side of a flat bed without using bedrails?: Total Help needed moving to and from a bed to a chair (including a wheelchair)?: Total Help needed standing up from a chair using your arms (e.g., wheelchair or bedside chair)?: Total Help needed to walk in hospital room?: Total Help needed climbing 3-5 steps with a railing? : Total 6 Click Score: 6    End of Session   Activity Tolerance: Patient limited by pain Patient left: in bed;with call bell/phone within reach Nurse Communication: Mobility status PT Visit Diagnosis: Muscle weakness (generalized) (M62.81);Difficulty in walking, not elsewhere classified (R26.2);Pain;Other abnormalities of gait and mobility (R26.89) Pain - Right/Left: Right Pain - part of body: Leg     Time: 2423-5361 PT Time Calculation (min) (ACUTE ONLY): 46 min (co-tx with OT)   Charges:  $Therapeutic Activity: 8-22 mins $Neuromuscular Re-education: 8-22 mins                     Windell Norfolk, DPT, PN1   Supplemental Physical Therapist [facility]    Pager [phone] Acute Rehab Office [phone]

## 2020-07-23 NOTE — Progress Notes (Signed)
PROGRESS NOTE  SUGAR VANZANDT  DOB: 1961-01-26  PCP: Lorene Dy, MD BMW:[id number]  DOA: 07/26/2020  LOS: 15 days   Chief Complaint  Patient presents with  . Wound Dehiscence    Brief narrative: Wanda [name] is a 59 y.o. female with PMH of kidney transplant 2020 with CKD 4T Tac/Pred, chronic anemia, gastric sleeve, GERD, hypoalbuminemia, DM2, HTN, PAD, hypothyroidism and chronic neuropathy. Patient presented to the ED on 07/25/2020 with wound dehiscence of recent right BKA stump. Patient recently had a prolonged hospitalization 05/01/2020-06/01/2020 in septic shock secondary to a R foot ulcer with Klebsiella bacteremia complicated by endocarditis.  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  Per ID recommendation, patient completed 6 weeks of IV meropenem on 9/25.  She returned to the ED 10/5 with a 1 week history of drainage and dehiscence of the stump site.  In the ED the patient was found to have an elevated WBC.   X-ray right tibia/fibula was without evidence of osseous abnormality.   She was found to be hyperkalemic at 6.0 and in acute renal failure with creatinine 3.4 up from baseline of 2.4.   She was admitted to hospitalist service. 10/12, she underwent wound revision in the OR.  Her hospitalization was prolonged because of progressive renal failure, persistent hyperkalemia, metabolic acidosis and severe constipation.  Subjective: Patient was seen and examined this morning.  Pleasant middle-aged African-American female.  Lying down in bed.  Not in distress.  Complains of dizziness for last few days.  Assessment/Plan: R BKA stump site dehiscence/wound infection -X-ray without evidence to suggest osteomyelitis. Blood cultures without any growth so far. -10/12, Ortho performed irrigation and excisional debridement of right leg wound w/ intermediate closure. -Wound cultures grew out Klebsiella. Based on ID recommendations patient is now on Cipro and Flagyl. Last day of antibiotics  will be 10/24. -She is currently in nonweightbearing status on the right lower extremity.  Recent history of endocarditis  -Patient recently completed 6 weeks course of IV meropenem on 9/25.  Repeat blood culture on this admission was negative.  -Repeat echocardiogram on this admission on 10/14 showed an echogenic mobile mass on the mitral valve.  Per cardiology, this is not a vegetation and likely likely represents a segment of redundant mitral subvalvular patterns.  TEE was not recommended.  Acute kidney injury h/o kidney transplant 2020 with CKD4 on transplanted kidney -Baseline creatinine 2.1-2.4 -Creatinine trending up, 3.75 today.  Nephrology following. -Continue LR at 100 mL/h. -Continue tacrolimus and prednisone as before. 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 -Continue sodium bicarb 1300 mg 3 times daily.  Hyperkalemia -Gradually improving.  In normal range today. -Continue twice daily Lokelma, restrict potassium in diet. Recent Labs  Lab 07/19/20 0059 07/20/20 0412 07/21/20 0559 07/22/20 0338 07/23/20 0153  K 5.4* 5.2* 5.0 4.2 4.2   Hypotension -Secondary to volume depletion. -With IV resuscitation, blood pressure has improved. -Cosyntropin stimulation test shows adequate response.  Severe constipation with nausea and anorexia -10/17, abdominal x-ray with diffuse stool throughout the colon.  No evidence of bowel obstruction or free air.   -10/19, patient had bowel movement with enema.   -Continue  Senokot twice daily and MiraLAX twice daily.  Hypothyroidism -Reported history of such but is not on thyroid hormone replacement.    -10/6, TSH elevated 24 but free T4 was normal.  Patient was initiated on thyroid supplement which I will continue. -TSH needs to be checked in 6 to 8 weeks.  Acute urinary retention -10/19, patient was noted to have greater than 400 mL of urine in her bladder overnight.  And in and out catheterization was done.   -Continue bladder scan.  Vertigo -Seems to have resolved.  Continue meclizine.  She will need to follow-up with ENT for her ear issues.    Recent COVID -Diagnosed with Covid 9/10 while at [facility] and quarantined for 14 days. Last day of isolation was 10/1.  -Chest x-ray 10/5 noted resolved bilateral lung opacities.  Respiratory status is stable.  Paroxysmal atrial fibrillation Continue amiodarone and Eliquis. Currently in sinus rhythm.  History of type 2 diabetes Hypoglycemic episodes -A1c 4.2 on 10/7.  Not on diabetes medications at home. -Cosyntropin stimulation test results do not suggest adrenal insufficiency. Recent Labs  Lab 07/22/20 1643 07/22/20 1951 07/23/20 0746 07/23/20 1213 07/23/20 1538  GLUCAP 149* 149* 88 110* 145*   Chronic normocytic anemia -Chronic anemia likely due to chronic disease.  No overt bleeding noted.  Hemoglobin is stable.  Severe malnutrition in context of chronic illness Nutrition consulted. Note as below.  Stage II sacral pressure ulcer -Patient has 1cm by 1cm blister on the sacrum and a blister on Lt  thigh and heel that's healing and scabbed over. -See wound care note.  Mobility: Patient has not been participating with physical therapy.  Patient prefers going home versus SNF. Code Status:   Code Status: Full Code  Nutritional status: Body mass index is 16.57 kg/m. Nutrition Problem: Severe Malnutrition Etiology: chronic illness Signs/Symptoms: energy intake < or equal to 75% for > or equal to 1 month, percent weight loss Percent weight loss: 30 % Diet Order            Diet regular Room service  appropriate? No; Fluid consistency: Thin  Diet effective now                 DVT prophylaxis: apixaban (ELIQUIS) tablet 2.5 mg Start: 07/15/20 1315 SCDs Start: 07/08/2020 1530 apixaban (ELIQUIS) tablet 2.5 mg   Antimicrobials:  Cipro and Flagyl till 10/24. Fluid: LR at 100 mill per hour Consultants: Nephrology Family Communication:  None at bedside  Status is: Inpatient  Remains inpatient appropriate because of hypotension, AKI  Dispo:  Patient From: [place]  Planned Disposition: Home  Expected discharge date: 07/24/20  Medically stable for discharge: No    Infusions:  . lactated ringers      Scheduled Meds: . amiodarone  200 mg Oral Daily  . apixaban  2.5 mg Oral BID  . ascorbic acid  250 mg Oral BID  . cholecalciferol  1,000 Units Oral Daily  . ciprofloxacin  500 mg Oral Daily  . feeding supplement  237 mL Oral TID BM  . folic acid  1 mg Oral Daily  . hydrocortisone   Rectal TID  . levothyroxine  25 mcg Oral Q0600  . meclizine  12.5 mg Oral TID  . metroNIDAZOLE  500 mg Oral Q8H  . mirtazapine  7.5 mg Oral QHS  . multivitamin with minerals  1 tablet Oral Daily  . nortriptyline  25 mg Oral QHS  . nystatin   Topical BID  . pantoprazole  40 mg Oral Daily  . polyethylene glycol  17 g Oral BID  . predniSONE  5 mg Oral Daily  . ramelteon  8 mg Oral QHS  . senna-docusate  1 tablet Oral BID  . sodium bicarbonate  1,300 mg Oral TID  . sodium zirconium cyclosilicate  10 g Oral BID  . tacrolimus  1.5 mg Oral Q12H  . thiamine  100 mg Oral Daily  . zinc sulfate  220 mg Oral Daily    Antimicrobials: Anti-infectives (From admission, onward)   Start     Dose/Rate Route Frequency Ordered Stop   07/18/20 1100  ciprofloxacin (CIPRO) tablet 500 mg        500 mg Oral Daily 07/18/20 1005     07/17/20 1400  metroNIDAZOLE (FLAGYL) tablet 500 mg        500 mg Oral Every 8 hours 07/17/20 1307     07/16/20 1600  ceFEPIme (MAXIPIME) 2 g in sodium chloride 0.9 %  100 mL IVPB  Status:  Discontinued        2 g 200 mL/hr over 30 Minutes Intravenous Every 24 hours 07/16/20 1406 07/18/20 0922   07/15/20 0600  ceFAZolin (ANCEF) IVPB 2g/100 mL premix        2 g 200 mL/hr over 30 Minutes Intravenous On call to O.R. 07/21/2020 1210 07/16/2020 1335   07/16/2020 1347  vancomycin (VANCOCIN) powder  Status:  Discontinued          As needed 07/06/2020 1356 07/10/2020 1413   07/20/2020 1213  ceFAZolin (ANCEF) 2-4 GM/100ML-% IVPB       Note to Pharmacy: Henrine Screws   : cabinet override      07/09/2020 1213 08/01/2020 1344      PRN meds: acetaminophen, alum & mag hydroxide-simeth, dextrose, dicyclomine, diphenhydrAMINE, diphenhydrAMINE, naphazoline-glycerin, ondansetron **OR** ondansetron (ZOFRAN) IV, oxyCODONE, traMADol   Objective: Vitals:   07/23/20 0516 07/23/20 1416  BP: 138/83 (!) 84/68  Pulse: 87 95  Resp: 18 20  Temp: 98 F (36.7 C) 98 F (36.7 C)  SpO2: 99% 94%    Intake/Output Summary (Last 24 hours) at 07/23/2020 1606 Last data filed at 07/22/2020 1800 Gross per 24 hour  Intake 426.67 ml  Output --  Net 426.67 ml   Filed Weights   07/08/20 1130  Weight: 50.9 kg   Weight change:  Body mass index is 16.57 kg/m.   Physical Exam: General exam: Appears calm and comfortable.  Not in physical distress Skin: Clinically looks dry. HEENT: Atraumatic, normocephalic, supple neck, no obvious bleeding Lungs: Clear to auscultation bilaterally CVS: Regular rate and rhythm, no murmur GI/Abd soft, nontender, nondistended, bowel sound present CNS: Alert, awake, oriented x3 Psychiatry: mood appropriate Extremities: Bandaged wounds bilaterally.  Has BKA wound on the right.  Data Review: I have personally reviewed the laboratory data and studies available.  Recent Labs  Lab 07/18/20 0221 07/19/20 0059 07/20/20 0412 07/21/20 0559 07/23/20 0153  WBC 11.9* 11.3* 8.8 11.8* 16.0*  NEUTROABS  --   --   --   --  14.1*  HGB 9.7* 10.3* 8.9* 9.5* 8.2*  HCT  30.3* 33.0* 28.5* 29.2* 25.5*  MCV 92.4 93.0 91.3 90.7 91.1  PLT 150 165 139* 230 130*   Recent Labs  Lab 07/19/20 0059 07/20/20 0412 07/21/20 0559 07/22/20 0338 07/23/20 0153  NA 145 147* 145 143 142  K 5.4* 5.2* 5.0 4.2 4.2  CL 118* 119* 119* 115* 113*  CO2 18* 19* 18* 19* 18*  GLUCOSE 79  65* 114* 189* 127*  BUN 62* 64* 67* 67* 65*  CREATININE 3.36* 3.35* 3.59* 3.68* 3.75*  CALCIUM 8.4* 8.1* 8.3* 8.0* 8.1*    F/u labs ordered.  Signed, Terrilee Croak, MD [facility] 07/23/2020

## 2020-07-23 NOTE — Progress Notes (Signed)
Occupational Therapy Treatment Patient Details Name: Wanda [name] MRN: [id number] DOB: March 26, 1961 Today's Date: 07/23/2020    History of present illness Wanda [name] is a 59 y.o. 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  Pt will have ortho intervention for closure during this admission.  Pt with significant PMH of recent COVID infection (off of precautions), chronic PAF, DM2, hypothyroidism, essential HTN, PVD, gout, CKD IV, Kidney transplant R 2013.    OT comments  Patient supine in bed, agreeable to attempt OT/PT session.  Limited due to bowel incontinence, pain due to wounds on bottom. Patient requires total assist +2 for rolling in bed, toileting (RN changing sacral pads for wounds).  With rolling requires hand over hand support to guide towards rails, poor initiation of tasks.  She remains limited by pain, generalized weakness and decreased activity tolerance.  Continue to recommend SNF.     Follow Up Recommendations  SNF    Equipment Recommendations  Other (comment);Wheelchair (measurements OT);Wheelchair cushion (measurements OT) (TBD )    Recommendations for Other Services      Precautions / Restrictions Precautions Precautions: Fall Precaution Comments: R BKA, sores on bottom (very sensitive)  Restrictions Weight Bearing Restrictions: Yes Other Position/Activity Restrictions: NWB R LE       Mobility Bed Mobility Overal bed mobility: Needs Assistance Bed Mobility: Rolling Rolling: Total assist;+2 for physical assistance         General bed mobility comments: poor intiation and sequencing, requires guiding support of UB to reach towards rails and +2 to roll trunk   Transfers                 General transfer comment: unable- pain from wounds on bottom    Balance                                           ADL either performed or assessed with  clinical judgement   ADL Overall ADL's : Needs assistance/impaired                 Upper Body Dressing : Moderate assistance;Bed level Upper Body Dressing Details (indicate cue type and reason): to don new gown          Toileting- Clothing Manipulation and Hygiene: Total assistance;+2 for physical assistance;+2 for safety/equipment;Bed level Toileting - Clothing Manipulation Details (indicate cue type and reason): total assist bed level due to incontinent BM      Functional mobility during ADLs: Total assistance;+2 for physical assistance;+2 for safety/equipment General ADL Comments: total assist bed level, limited due to incontience BM and pain      Vision       Perception     Praxis      Cognition Arousal/Alertness: Awake/alert Behavior During Therapy: WFL for tasks assessed/performed Overall Cognitive Status: No family/caregiver present to determine baseline cognitive functioning                                 General Comments: anxious with mobility, requires cueing for problem solving, sequencing          Exercises     Shoulder Instructions       General Comments unable to tolerate sitting at EOB to assess balance  Pertinent Vitals/ Pain       Pain Assessment: Faces Faces Pain Scale: Hurts worst Pain Location: generalized and R LE, bottom Pain Descriptors / Indicators: Grimacing;Guarding;Sore;Aching Pain Intervention(s): Limited activity within patient's tolerance;Monitored during session;Repositioned  Home Living                                          Prior Functioning/Environment              Frequency  Min 2X/week        Progress Toward Goals  OT Goals(current goals can now be found in the care plan section)  Progress towards OT goals: Not progressing toward goals - comment (limited participation, pain and buttocks wounds)  Acute Rehab OT Goals Patient Stated Goal: less pain  OT Goal  Formulation: With patient  Plan Discharge plan remains appropriate;Frequency remains appropriate    Co-evaluation    PT/OT/SLP Co-Evaluation/Treatment: Yes Reason for Co-Treatment: Complexity of the patient's impairments (multi-system involvement)   OT goals addressed during session: ADL's and self-care      AM-PAC OT "6 Clicks" Daily Activity     Outcome Measure   Help from another person eating meals?: A Little Help from another person taking care of personal grooming?: A Little Help from another person toileting, which includes using toliet, bedpan, or urinal?: Total Help from another person bathing (including washing, rinsing, drying)?: A Lot Help from another person to put on and taking off regular upper body clothing?: A Lot Help from another person to put on and taking off regular lower body clothing?: Total 6 Click Score: 12    End of Session    OT Visit Diagnosis: Other abnormalities of gait and mobility (R26.89);Muscle weakness (generalized) (M62.81)   Activity Tolerance Patient limited by pain;Other (comment) (bowel incontience )   Patient Left in bed;with call bell/phone within reach   Nurse Communication Mobility status        Time: 0300-9233 OT Time Calculation (min): 46 min  Charges: OT General Charges $OT Visit: 1 Visit OT Treatments $Self Care/Home Management : 8-22 mins  Jolaine Artist, OT [place] Pager [phone] Office [place] 07/23/2020, 3:20 PM

## 2020-07-23 NOTE — Progress Notes (Signed)
Nutrition Follow-up  DOCUMENTATION CODES:   Severe malnutrition in context of chronic illness  INTERVENTION:  D/c Nepro  Ensure Enlive po TID, each supplement provides 350 kcal and 20 grams of protein  Continue MVI daily   NUTRITION DIAGNOSIS:   Severe Malnutrition related to chronic illness as evidenced by energy intake < or equal to 75% for > or equal to 1 month, percent weight loss.  ongoing  GOAL:   Patient will meet greater than or equal to 90% of their needs  progressing  MONITOR:   PO intake, Supplement acceptance, Weight trends, Labs, I & O's, Skin  REASON FOR ASSESSMENT:   Consult Assessment of nutrition requirement/status  ASSESSMENT:   Pt admitted with RLE stump site wound dehiscence. PMH includes A.fib., CKD stage IV s/p renal transplant, anemia of CKD, gastric sleeve, hypoalbuminemia, type 2 DM, HTN, hypothyroidism, GERD, peripheral artery disease, chronic neuropathy, and recent admission 7/30-8/30 for septic shock 2/2 R foot ulcer w/ Klebsiella bacteremia complicated by endocarditis. 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 Pt recently COVID-1 positive; last day of quarantine was 10/1.  10/12 s/p Irrigation and excisional debridement of R leg wound 15 cm x 3 cm x 2 cm; Intermediate closure of R leg wound 15 cm long with extensive undermining  Pt's hospitalization has been prolonged because of progressive renal failure, persistent hyperkalemia, metabolic acidosis, and severe constipation. Per MD, pt has not been participating with PT and would like to go home as opposed to SNF. Pt anticipated to d/c today.  Limited meal documentation since last RD assessment -- only 3 meals have been recorded as 0-25% completion. Per RN, pt has not been eating well, but has been doing fairly well with Nepro. Pt noted to have consumed ~50% of her shake this morning. Given pt's hyperkalemia has improved with binder and pt consumed Ensure better than Nepro, will transition pt back to  Ensure.  Labs reviewed. Medications: Vitamin C, Vitamin D3, Folvite, Remeron, MVI, Protonix, Miralax, Deltasone, Senokot-S, Sodium Bicarbonate, Lokelma, Thiamine, Zinc sulfate  Diet Order:   Diet Order            Diet regular Room service appropriate? No; Fluid consistency: Thin  Diet effective now                 EDUCATION NEEDS:   No education needs have been identified at this time  Skin:  Skin Assessment: Skin Integrity Issues: Skin Integrity Issues:: Incisions, Stage II, Unstageable Stage II: L buttocks Unstageable: L heel Incisions: leg  Last BM:  10/20  Height:   Ht Readings from Last 1 Encounters:  07/10/20 5\' 9"  (1.753 m)    Weight:   Wt Readings from Last 1 Encounters:  07/08/20 50.9 kg    BMI:  Body mass index is 16.57 kg/m.  Estimated Nutritional Needs:   Kcal:  2000-2200  Protein:  100-115 grams  Fluid:  >/=2L/d    Larkin Ina, MS, RD, LDN RD pager number and weekend/on-call pager number located in [place] [facility].

## 2020-07-23 NOTE — Consult Note (Signed)
[place] Nurse wound consult note Consultation was completed by review of records, images and assistance from the bedside nurse/clinical staff.    Reason for Consult:pressure injuries  Wound type: 1. Stage 2 Pressure Injury; left buttock 2. Unstageable Pressure Injury: left heel 3. Partial thickness ulceration: left great toe; plantar aspect  Pressure Injury POA: Yes Measurement:  1. Requested measurement per nursing with topical care 2. Left heel 1cm x 1cm x 0cm; dark black tissue 3. requested measurement per nursing with topical care Wound bed: 1. No image; documented per hospitalist as blister 2. Left heel: 100% black 3. Left great toe; 90% pink, pale, 10% purple what appears to be skin flap  Drainage (amount, consistency, odor) see nursing flow sheets Periwound: intact  Dressing procedure/placement/frequency: 1. Offloading in place with Prevalon boot currently 2. Xerform to the great toe wound; change every other day for antibacterial protection; insulation; moist wound healing 3. Paint left heel with betadine daily, allow to air dry.  4. Low air loss mattress in place for moisture management and pressure redistribution.   Discussed POC with bedside nurse via [facility] Re consult if needed, will not follow at this time. Thanks  Brianni Manthe [employer], RN,CWOCN, CNS, [place] [phone])

## 2020-07-24 DIAGNOSIS — T8130XA Disruption of wound, unspecified, initial encounter: Secondary | ICD-10-CM | POA: Diagnosis not present

## 2020-07-24 LAB — CBC WITH DIFFERENTIAL/PLATELET
Abs Immature Granulocytes: 0.1 10*3/uL — ABNORMAL HIGH (ref 0.00–0.07)
Basophils Absolute: 0 10*3/uL (ref 0.0–0.1)
Basophils Relative: 0 %
Eosinophils Absolute: 0 10*3/uL (ref 0.0–0.5)
Eosinophils Relative: 0 %
HCT: 28.5 % — ABNORMAL LOW (ref 36.0–46.0)
Hemoglobin: 8.8 g/dL — ABNORMAL LOW (ref 12.0–15.0)
Immature Granulocytes: 1 %
Lymphocytes Relative: 12 %
Lymphs Abs: 1.6 10*3/uL (ref 0.7–4.0)
MCH: 29.1 pg (ref 26.0–34.0)
MCHC: 30.9 g/dL (ref 30.0–36.0)
MCV: 94.4 fL (ref 80.0–100.0)
Monocytes Absolute: 0.5 10*3/uL (ref 0.1–1.0)
Monocytes Relative: 4 %
Neutro Abs: 11.1 10*3/uL — ABNORMAL HIGH (ref 1.7–7.7)
Neutrophils Relative %: 83 %
Platelets: 124 10*3/uL — ABNORMAL LOW (ref 150–400)
RBC: 3.02 MIL/uL — ABNORMAL LOW (ref 3.87–5.11)
RDW: 21.2 % — ABNORMAL HIGH (ref 11.5–15.5)
WBC: 13.4 10*3/uL — ABNORMAL HIGH (ref 4.0–10.5)
nRBC: 0 % (ref 0.0–0.2)

## 2020-07-24 LAB — GLUCOSE, CAPILLARY
Glucose-Capillary: 87 mg/dL (ref 70–99)
Glucose-Capillary: 90 mg/dL (ref 70–99)
Glucose-Capillary: 82 mg/dL (ref 70–99)
Glucose-Capillary: 75 mg/dL (ref 70–99)
Glucose-Capillary: 78 mg/dL (ref 70–99)

## 2020-07-24 LAB — BASIC METABOLIC PANEL
Anion gap: 13 (ref 5–15)
BUN: 69 mg/dL — ABNORMAL HIGH (ref 6–20)
CO2: 16 mmol/L — ABNORMAL LOW (ref 22–32)
Calcium: 8.2 mg/dL — ABNORMAL LOW (ref 8.9–10.3)
Chloride: 113 mmol/L — ABNORMAL HIGH (ref 98–111)
Creatinine, Ser: 3.76 mg/dL — ABNORMAL HIGH (ref 0.44–1.00)
GFR, Estimated: 13 mL/min — ABNORMAL LOW (ref >60)
Glucose, Bld: 117 mg/dL — ABNORMAL HIGH (ref 70–99)
Potassium: 4.2 mmol/L (ref 3.5–5.1)
Sodium: 142 mmol/L (ref 135–145)

## 2020-07-24 MED ORDER — LACTATED RINGERS IV SOLN: INTRAVENOUS | Status: AC

## 2020-07-24 MED ORDER — LACTATED RINGERS IV BOLUS
500.0000 mL | Freq: Once | INTRAVENOUS | Status: AC
  Administered 2020-07-24: 500 mL via INTRAVENOUS

## 2020-07-24 NOTE — Progress Notes (Signed)
Admit: 07/25/2020 LOS: 63  13F s/p KT 2020 with CKD4T Tac/Pred; admitted with dehisced and infected recent right BKA now on broad-spectrum antibiotics; persistent hyperkalemia and metabolic acidosis.  Subjective:  [name] Kitchen Multiple episodes of diarrhea. Trying to eat but has not eaten much. Still feels dehydrated.  Denies any fevers, chest pain, shortness of breath, orthopnea, swelling, changes in urinary frequency.  10/21 0701 - 10/22 0700 In: 1000 [I.V.:1000] Out: -   Filed Weights   07/08/20 1130  Weight: 50.9 kg    Scheduled Meds: . amiodarone  200 mg Oral Daily  . apixaban  2.5 mg Oral BID  . ascorbic acid  250 mg Oral BID  . cholecalciferol  1,000 Units Oral Daily  . ciprofloxacin  500 mg Oral Daily  . feeding supplement  237 mL Oral TID BM  . folic acid  1 mg Oral Daily  . hydrocortisone   Rectal TID  . levothyroxine  25 mcg Oral Q0600  . meclizine  12.5 mg Oral TID  . metroNIDAZOLE  500 mg Oral Q8H  . mirtazapine  7.5 mg Oral QHS  . multivitamin with minerals  1 tablet Oral Daily  . nortriptyline  25 mg Oral QHS  . nystatin   Topical BID  . pantoprazole  40 mg Oral Daily  . polyethylene glycol  17 g Oral BID  . predniSONE  5 mg Oral Daily  . ramelteon  8 mg Oral QHS  . senna-docusate  1 tablet Oral BID  . sodium bicarbonate  1,300 mg Oral TID  . sodium zirconium cyclosilicate  10 g Oral BID  . tacrolimus  1.5 mg Oral Q12H  . thiamine  100 mg Oral Daily  . zinc sulfate  220 mg Oral Daily   Continuous Infusions: . lactated ringers    . lactated ringers     PRN Meds:.acetaminophen, alum & mag hydroxide-simeth, dextrose, dicyclomine, diphenhydrAMINE, diphenhydrAMINE, naphazoline-glycerin, ondansetron **OR** ondansetron (ZOFRAN) IV, oxyCODONE, traMADol  Current Labs: reviewed   Physical Exam:  Blood pressure (!) 84/68, pulse 95, temperature 98 F (36.7 C), resp. rate 20, height 5\' 9"  (1.753 m), weight 50.9 kg, SpO2 94 %. GEN: chronically ill appearing, NAD ENT:  temporal wasting, dry mucosal membranes EYES: EOMI CV: Regular, nl s1s2 PULM: CTAB ABD: soft, nt/nd SKIN: RLE bandagd EXT:No sig LLE edema.  Unna boot  A 1. CKD 4T: Probably at her baseline, Continue current transplant regimen. Follows with Dr. Elmarie Shiley 2. Hyperkalemia: Likely from a type IV RTA and low GFR; stable if K is less than 6, continue twice daily lokelma, restrict K in diet, see #3 3. 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 DM2 6. AFib 7. Anemia, Hb lower today, mgmt per primary service 8. HTN, BP stable 9. Constipation with n/v 10. Urinary retention 11. Diarrhea  P . Cont supportive care . Still relatively dry.  LR 576mL bolus now followed by LR 24mL/hr, watch volume status closely . Continue serial bladder scans, if consistently retaining then would recommend a foley placement . Labs reviewed, renal function stable. No indication for renal replacement therapy . Tac trough drawn 10/19, result pending . Bicarb slightly lower today, suspecting this is secondary to diarrhea . Consider cdiff testing . Continue outpatient immunosuppression of tacrolimus and prednisone. Transplant ultrasound reviewed . Medication Issues; o Preferred narcotic agents for pain control are hydromorphone, fentanyl, and  methadone. Morphine should not be used.  o Baclofen should be avoided o Avoid oral sodium phosphate and magnesium citrate based laxatives / bowel preps    Gean Quint, MD [place] 07/24/2020, 10:31 AM  Recent Labs  Lab 07/22/20 0338 07/23/20 0153 07/24/20 0029  NA 143 142 142  K 4.2 4.2 4.2  CL 115* 113* 113*  CO2 19* 18* 16*  GLUCOSE 189* 127* 117*  BUN 67* 65* 69*  CREATININE 3.68* 3.75* 3.76*  CALCIUM 8.0* 8.1* 8.2*   Recent Labs  Lab 07/21/20 0559 07/23/20 0153 07/24/20 0029  WBC 11.8*  16.0* 13.4*  NEUTROABS  --  14.1* 11.1*  HGB 9.5* 8.2* 8.8*  HCT 29.2* 25.5* 28.5*  MCV 90.7 91.1 94.4  PLT 230 130* 124*

## 2020-07-24 NOTE — Progress Notes (Signed)
PROGRESS NOTE  Wanda [name]  DOB: Mar 01, 1961  PCP: Lorene Dy, MD FOY:[id number]  DOA: 07/27/2020  LOS: 16 days   Chief Complaint  Patient presents with   Wound Dehiscence    Brief narrative: Wanda [name] is a 59 y.o. female with PMH of kidney transplant 2020 with CKD 4T Tac/Pred, chronic anemia, gastric sleeve, GERD, hypoalbuminemia, DM2, HTN, PAD, hypothyroidism and chronic neuropathy. Patient presented to the ED on 07/14/2020 with wound dehiscence of recent right BKA stump. Patient recently had a prolonged hospitalization 05/01/2020-06/01/2020 in septic shock secondary to a R foot ulcer with Klebsiella bacteremia complicated by endocarditis.  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  Per ID recommendation, patient completed 6 weeks of IV meropenem on 9/25.  She returned to the ED 10/5 with a 1 week history of drainage and dehiscence of the stump site.  In the ED the patient was found to have an elevated WBC.   X-ray right tibia/fibula was without evidence of osseous abnormality.   She was found to be hyperkalemic at 6.0 and in acute renal failure with creatinine 3.4 up from baseline of 2.4.   She was admitted to hospitalist service. 10/12, she underwent wound revision in the OR.  Her hospitalization was prolonged because of progressive renal failure, persistent hyperkalemia, metabolic acidosis and severe constipation.  Subjective: Patient was seen and examined this morning.   Had low blood pressure.  Continues to feel and look dehydrated.   For several days, she had constipation which was relieved with MiraLAX and Senokot.  Now she has multiple episodes of loose stool.  Laxatives have been stopped.    Assessment/Plan: R BKA stump site dehiscence/wound infection -X-ray without evidence to suggest osteomyelitis. Blood cultures without any growth so far. -10/12, Ortho performed irrigation and excisional debridement of right leg wound w/ intermediate closure. -Wound cultures  grew out Klebsiella. Based on ID recommendations patient is now on Cipro and Flagyl. Last day of antibiotics will be 10/24. -She is currently in nonweightbearing status on the right lower extremity.  Recent history of endocarditis  -Patient recently completed 6 weeks course of IV meropenem on 9/25.  Repeat blood culture on this admission was negative.  -Repeat echocardiogram on this admission on 10/14 showed an echogenic mobile mass on the mitral valve.  Per cardiology, this is not a vegetation and likely likely represents a segment of redundant mitral subvalvular patterns.  TEE was not recommended.  Acute kidney injury h/o kidney transplant 2020 with CKD4 on transplanted kidney -Baseline creatinine 2.1-2.4 -Creatinine trending up, 3.76 today.  Nephrology following. -Continue LR at 75 mL/h. -Continue tacrolimus and prednisone as before. 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 -Continue sodium bicarb 1300 mg 3 times daily.  Hyperkalemia -Gradually improving.  In normal range today. -Continue twice daily Lokelma, restrict potassium in diet. Recent Labs  Lab 07/20/20 0412 07/21/20 0559 07/22/20 0338 07/23/20 0153 07/24/20 0029  K 5.2* 5.0 4.2 4.2 4.2   Hypotension -Secondary to volume depletion. -With IV resuscitation, blood pressure is gradually improving. -Cosyntropin stimulation test shows adequate response.  Diarrhea Severe constipation with nausea and anorexia -For several days, she had constipation  which was relieved with MiraLAX and Senokot.  Now she has multiple episodes of loose stool.  Laxatives have been stopped.   -Continue to monitor  bowel movement.  Hypothyroidism -Reported history of such but is not on thyroid hormone replacement.  -10/6, TSH elevated 24 but free T4 was normal.  Patient was initiated on thyroid supplement which we will continue. -TSH needs to be checked in 6 to 8 weeks.  Acute urinary retention -10/19, patient was noted to have greater than 400 mL of urine in her bladder overnight.  And in and out catheterization was done.   -Continue bladder scan.  Vertigo -Seems to have resolved.  Continue meclizine.  She will need to follow-up with ENT for her ear issues.    Recent COVID -Diagnosed with Covid 9/10 while at [facility] and quarantined for 14 days. Last day of isolation was 10/1.  -Chest x-ray 10/5 noted resolved bilateral lung opacities.  Respiratory status is stable.  Paroxysmal atrial fibrillation Continue amiodarone and Eliquis. Currently in sinus rhythm.  History of type 2 diabetes Hypoglycemic episodes -A1c 4.2 on 10/7.  Not on diabetes medications at home. -Cosyntropin stimulation test results do not suggest adrenal insufficiency. Recent Labs  Lab 07/23/20 1213 07/23/20 1538 07/23/20 2139 07/24/20 0827 07/24/20 1222  GLUCAP 110* 145* 125* 87 90   Chronic normocytic anemia -Chronic anemia likely due to chronic disease.  No overt bleeding noted.  Hemoglobin is stable.  Severe malnutrition in context of chronic illness Nutrition consulted. Note as below.  Stage II sacral pressure ulcer -Patient has 1cm by 1cm blister on the sacrum and a blister on left thigh and heel that's healing and scabbed over. -See wound care note.  Mobility: Patient has not been participating with physical therapy.  Patient prefers going home versus SNF. Code Status:   Code Status: Full Code  Nutritional status: Body mass index is 16.57 kg/m. Nutrition Problem: Severe Malnutrition Etiology: chronic illness Signs/Symptoms: energy intake < or equal to 75% for > or equal to 1 month,  percent weight loss Percent weight loss: 30 % Diet Order            Diet regular Room service appropriate? No; Fluid consistency: Thin  Diet effective now                 DVT prophylaxis: apixaban (ELIQUIS) tablet 2.5 mg Start: 07/15/20 1315 SCDs Start: 07/07/2020 1530 apixaban (ELIQUIS) tablet 2.5 mg   Antimicrobials:  Cipro and Flagyl till 10/24. Fluid: LR at 74 mill per hour Consultants: Nephrology Family Communication:  None at bedside  Status is: Inpatient  Remains inpatient appropriate because of hypotension, AKI  Dispo:  Patient From: [employer] [facility]  Planned Disposition: Home  Expected discharge date: 07/28/20  Medically stable for discharge: No    Infusions:   lactated ringers 75 mL/hr at 07/24/20 1120    Scheduled Meds:  amiodarone  200 mg Oral Daily   apixaban  2.5 mg Oral BID   ascorbic acid  250 mg Oral BID   cholecalciferol  1,000 Units Oral Daily   ciprofloxacin  500 mg Oral Daily   feeding supplement  237 mL Oral TID BM   folic acid  1 mg Oral Daily   hydrocortisone   Rectal TID   levothyroxine  25 mcg Oral Q0600   meclizine  12.5 mg Oral TID   metroNIDAZOLE  500 mg Oral Q8H   mirtazapine  7.5 mg Oral QHS   multivitamin with minerals  1 tablet Oral Daily   nortriptyline  25 mg Oral QHS   nystatin   Topical BID   pantoprazole  40 mg Oral Daily   predniSONE  5 mg Oral Daily   ramelteon  8 mg Oral QHS   sodium bicarbonate  1,300 mg Oral TID   sodium zirconium cyclosilicate  10 g Oral BID   tacrolimus  1.5 mg Oral Q12H   thiamine  100 mg Oral Daily   zinc sulfate  220 mg Oral Daily    Antimicrobials: Anti-infectives (From admission, onward)   Start     Dose/Rate Route Frequency Ordered Stop   07/18/20 1100  ciprofloxacin (CIPRO) tablet 500 mg        500 mg Oral Daily 07/18/20 1005     07/17/20 1400  metroNIDAZOLE (FLAGYL) tablet 500 mg        500 mg Oral Every 8 hours 07/17/20 1307     07/16/20 1600   ceFEPIme (MAXIPIME) 2 g in sodium chloride 0.9 % 100 mL IVPB  Status:  Discontinued        2 g 200 mL/hr over 30 Minutes Intravenous Every 24 hours 07/16/20 1406 07/18/20 0922   07/15/20 0600  ceFAZolin (ANCEF) IVPB 2g/100 mL premix        2 g 200 mL/hr over 30 Minutes Intravenous On call to O.R. 07/23/2020 1210 07/15/2020 1335   08/02/2020 1347  vancomycin (VANCOCIN) powder  Status:  Discontinued          As needed 07/27/2020 1356 07/28/2020 1413   07/30/2020 1213  ceFAZolin (ANCEF) 2-4 GM/100ML-% IVPB       Note to Pharmacy: Henrine Screws   : cabinet override      07/06/2020 1213 07/08/2020 1344      PRN meds: acetaminophen, alum & mag hydroxide-simeth, dextrose, dicyclomine, diphenhydrAMINE, diphenhydrAMINE, naphazoline-glycerin, ondansetron **OR** ondansetron (ZOFRAN) IV, oxyCODONE, traMADol   Objective: Vitals:   07/23/20 1416 07/24/20 1415  BP: (!) 84/68 (!) 110/58  Pulse: 95 92  Resp: 20 20  Temp: 98 F (36.7 C) 97.9 F (36.6 C)  SpO2: 94% 97%    Intake/Output Summary (Last 24 hours) at 07/24/2020 1629 Last data filed at 07/23/2020 1642 Gross per 24 hour  Intake 0 ml  Output --  Net 0 ml   Filed Weights   07/08/20 1130  Weight: 50.9 kg   Weight change:  Body mass index is 16.57 kg/m.   Physical Exam: General exam: Appears calm and comfortable.  Not in physical distress Skin: Clinically looks dry. HEENT: Atraumatic, normocephalic, supple neck, no obvious bleeding Lungs: Clear to auscultation bilaterally CVS: Regular rate and rhythm, no murmur GI/Abd soft, nontender, nondistended, bowel sound present CNS: Alert, awake, oriented x3 Psychiatry: Mood appropriate Extremities: Bandaged wounds bilaterally.  Has BKA wound on the right.  Data Review: I have personally reviewed the laboratory data and studies available.  Recent Labs  Lab 07/19/20 0059 07/20/20 0412 07/21/20 0559 07/23/20 0153 07/24/20 0029  WBC 11.3* 8.8 11.8* 16.0* 13.4*  NEUTROABS  --   --   --  14.1*  11.1*  HGB 10.3* 8.9* 9.5* 8.2* 8.8*  HCT 33.0* 28.5* 29.2* 25.5* 28.5*  MCV 93.0 91.3 90.7 91.1 94.4  PLT 165 139* 230 130* 124*   Recent Labs  Lab 07/20/20 0412 07/21/20 0559 07/22/20 0338 07/23/20 0153 07/24/20 0029  NA 147* 145 143 142 142  K 5.2* 5.0 4.2 4.2 4.2  CL 119* 119* 115* 113* 113*  CO2 19* 18* 19*  18* 16*  GLUCOSE 65* 114* 189* 127* 117*  BUN 64* 67* 67* 65* 69*  CREATININE 3.35* 3.59* 3.68* 3.75* 3.76*  CALCIUM 8.1* 8.3* 8.0* 8.1* 8.2*    F/u labs ordered.  Signed, Terrilee Croak, MD [facility] 07/24/2020

## 2020-07-24 NOTE — TOC Progression Note (Addendum)
Transition of Care [facility]) - Progression Note    Patient Details  Name: JLEE HARKLESS MRN: [id number] Date of Birth: Nov 09, 1960  Transition of Care [facility]) CM/SW [place], RN Phone Number: 07/24/2020, 10:14 AM  Clinical Narrative:    OT and PT continue recommending SNF, however the patient has run out of SNF days and cannot pay for further days . Has all necessary equipment at home as well as family at home to take care of patient. [place] is ready to assist with PT OT Aide RN and CSW.  Home Health. Awaiting Nephrology  Clearance for discharge.high risk for readmission.   Creatinine stable. Spoke to daughter [name]  She asked about purewick discussed that was self pay as of now, but she can call [facility] for verification. Patient will go by [facility] when discharged. Verified all equipment and care provisions were available. Visit with patient. Very sleepy today. States she will go to home address, consented to [place].     Expected Discharge Plan: [place] Barriers to Discharge: Continued Medical Work up  Expected Discharge Plan and Services Expected Discharge Plan: [place] Choice: [place] arrangements for the past 2 months: Single Family Home                           HH Arranged: RN, PT, OT, Nurse's Aide, Refused SNF [facility] Agency: [facility] Date [place]: 07/13/20 Time [place]: [place] Representative spoke with at [place]: [place]   Social Determinants of Health (Rafael Gonzalez) Interventions    Readmission Risk Interventions Readmission Risk Prevention Plan 02/06/2020  Transportation Screening Complete  PCP or Specialist Appt within 3-5 Days Complete  HRI or [place] Complete  Social Work Consult for [place] Planning/Counseling [place] Not Applicable  Medication Review Press photographer) Complete  Some recent data  might be hidden

## 2020-07-25 DIAGNOSIS — T8130XA Disruption of wound, unspecified, initial encounter: Secondary | ICD-10-CM | POA: Diagnosis not present

## 2020-07-25 LAB — CBC WITH DIFFERENTIAL/PLATELET
Abs Immature Granulocytes: 0.06 10*3/uL (ref 0.00–0.07)
Basophils Absolute: 0 10*3/uL (ref 0.0–0.1)
Basophils Relative: 0 %
Eosinophils Absolute: 0 10*3/uL (ref 0.0–0.5)
Eosinophils Relative: 0 %
HCT: 25.8 % — ABNORMAL LOW (ref 36.0–46.0)
Hemoglobin: 8.3 g/dL — ABNORMAL LOW (ref 12.0–15.0)
Immature Granulocytes: 1 %
Lymphocytes Relative: 9 %
Lymphs Abs: 1.2 10*3/uL (ref 0.7–4.0)
MCH: 29.1 pg (ref 26.0–34.0)
MCHC: 32.2 g/dL (ref 30.0–36.0)
MCV: 90.5 fL (ref 80.0–100.0)
Monocytes Absolute: 0.3 10*3/uL (ref 0.1–1.0)
Monocytes Relative: 2 %
Neutro Abs: 11.4 10*3/uL — ABNORMAL HIGH (ref 1.7–7.7)
Neutrophils Relative %: 88 %
Platelets: 116 10*3/uL — ABNORMAL LOW (ref 150–400)
RBC: 2.85 MIL/uL — ABNORMAL LOW (ref 3.87–5.11)
RDW: 20.9 % — ABNORMAL HIGH (ref 11.5–15.5)
WBC: 13 10*3/uL — ABNORMAL HIGH (ref 4.0–10.5)
nRBC: 0 % (ref 0.0–0.2)

## 2020-07-25 LAB — BASIC METABOLIC PANEL
Anion gap: 9 (ref 5–15)
BUN: 68 mg/dL — ABNORMAL HIGH (ref 6–20)
CO2: 18 mmol/L — ABNORMAL LOW (ref 22–32)
Calcium: 8.2 mg/dL — ABNORMAL LOW (ref 8.9–10.3)
Chloride: 114 mmol/L — ABNORMAL HIGH (ref 98–111)
Creatinine, Ser: 3.81 mg/dL — ABNORMAL HIGH (ref 0.44–1.00)
GFR, Estimated: 13 mL/min — ABNORMAL LOW (ref >60)
Glucose, Bld: 82 mg/dL (ref 70–99)
Potassium: 4 mmol/L (ref 3.5–5.1)
Sodium: 141 mmol/L (ref 135–145)

## 2020-07-25 LAB — GLUCOSE, CAPILLARY
Glucose-Capillary: 58 mg/dL — ABNORMAL LOW (ref 70–99)
Glucose-Capillary: 66 mg/dL — ABNORMAL LOW (ref 70–99)
Glucose-Capillary: 70 mg/dL (ref 70–99)
Glucose-Capillary: 95 mg/dL (ref 70–99)
Glucose-Capillary: 92 mg/dL (ref 70–99)

## 2020-07-25 NOTE — Plan of Care (Signed)
  Problem: Education: Goal: Knowledge of General Education information will improve Description: Including pain rating scale, medication(s)/side effects and non-pharmacologic comfort measures Outcome: Not Progressing   Problem: Health Behavior/Discharge Planning: Goal: Ability to manage health-related needs will improve Outcome: Not Progressing   Problem: Clinical Measurements: Goal: Ability to maintain clinical measurements within normal limits will improve Outcome: Not Progressing Goal: Will remain free from infection Outcome: Not Progressing Goal: Diagnostic test results will improve Outcome: Not Progressing Goal: Respiratory complications will improve Outcome: Not Progressing Goal: Cardiovascular complication will be avoided Outcome: Not Progressing   Problem: Coping: Goal: Level of anxiety will decrease Outcome: Not Progressing   Problem: Elimination: Goal: Will not experience complications related to bowel motility Outcome: Not Progressing Goal: Will not experience complications related to urinary retention Outcome: Not Progressing   Problem: Activity: Goal: Risk for activity intolerance will decrease Outcome: Not Progressing

## 2020-07-25 NOTE — Progress Notes (Signed)
PROGRESS NOTE  Wanda [name]  DOB: 26-Nov-1960  PCP: Lorene Dy, MD DDU:[id number]  DOA: 07/12/2020  LOS: 17 days   Chief Complaint  Patient presents with  . Wound Dehiscence    Brief narrative: Wanda [name] is a 59 y.o. female with PMH of kidney transplant 2020 with CKD 4T Tac/Pred, chronic anemia, gastric sleeve, GERD, hypoalbuminemia, DM2, HTN, PAD, hypothyroidism and chronic neuropathy. Patient presented to the ED on 07/15/2020 with wound dehiscence of recent right BKA stump. Patient recently had a prolonged hospitalization 05/01/2020-06/01/2020 in septic shock secondary to a R foot ulcer with Klebsiella bacteremia complicated by endocarditis.  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  Per ID recommendation, patient completed 6 weeks of IV meropenem on 9/25.  She returned to the ED 10/5 with a 1 week history of drainage and dehiscence of the stump site.  In the ED the patient was found to have an elevated WBC.   X-ray right tibia/fibula was without evidence of osseous abnormality.   She was found to be hyperkalemic at 6.0 and in acute renal failure with creatinine 3.4 up from baseline of 2.4.   She was admitted to hospitalist service. 10/12, she underwent wound revision in the OR.  Her hospitalization was prolonged because of progressive renal failure, persistent hyperkalemia, metabolic acidosis and severe constipation.  Subjective: Patient was seen and examined this morning.   Poor oral intake.  No more diarrhea.  Making urine output.  Assessment/Plan: R BKA stump site dehiscence/wound infection -X-ray without evidence to suggest osteomyelitis. Blood cultures without any growth so far. -10/12, Ortho performed irrigation and excisional debridement of right leg wound w/ intermediate closure. -Wound cultures grew out Klebsiella. Based on ID recommendations patient is now on Cipro and Flagyl. Last day of antibiotics will be 10/24. -She is currently in nonweightbearing status on  the right lower extremity.  Recent history of endocarditis  -Patient recently completed 6 weeks course of IV meropenem on 9/25.  Repeat blood culture on this admission was negative.  -Repeat echocardiogram on this admission on 10/14 showed an echogenic mobile mass on the mitral valve.  Per cardiology, this is not a vegetation and likely likely represents a segment of redundant mitral subvalvular patterns.  TEE was not recommended.  Acute kidney injury h/o kidney transplant 2020 with CKD4 on transplanted kidney -Baseline creatinine 2.1-2.4 -Creatinine trending up, 3.81 today.  Nephrology following. -Continue LR at 75 mL/h. -Continue tacrolimus and prednisone as before. 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 -Continue sodium bicarb 1300 mg 3 times daily.  Hyperkalemia -Gradually improving.  In normal range today. -Continue twice daily Lokelma, restrict potassium in diet. Recent Labs  Lab 07/21/20 0559 07/22/20 0338 07/23/20 0153 07/24/20 0029 07/25/20 0137  K 5.0 4.2 4.2 4.2 4.0   Hypotension -Secondary to volume depletion. -With IV resuscitation, blood pressure is gradually improving. -Cosyntropin stimulation test shows adequate response.  Diarrhea Severe constipation with nausea and anorexia -For several days, she had constipation which was relieved with MiraLAX and Senokot.  Now she has multiple episodes of loose stool.  Laxatives have been stopped.   -Continue to monitor bowel movement.  Hypothyroidism -Reported  history of such but is not on thyroid hormone replacement.  -10/6, TSH elevated 24 but free T4 was normal.  Patient was  initiated on thyroid supplement which we will continue. -TSH needs to be checked in 6 to 8 weeks.  Acute urinary retention -10/19, patient was noted to have greater than 400 mL of urine in her bladder overnight.  And in and out catheterization was done.   -Continue bladder scan.  Vertigo -Seems to have resolved.  Continue meclizine.  She will need to follow-up with ENT for her ear issues.    Recent COVID -Diagnosed with Covid 9/10 while at [facility] and quarantined for 14 days. Last day of isolation was 10/1.  -Chest x-ray 10/5 noted resolved bilateral lung opacities.  Respiratory status is stable.  Paroxysmal atrial fibrillation Continue amiodarone and Eliquis. Currently in sinus rhythm.  History of type 2 diabetes Hypoglycemic episodes -A1c 4.2 on 10/7. Not on diabetes medications at home. -Cosyntropin stimulation test results do not suggest adrenal insufficiency. Recent Labs  Lab 07/24/20 2122 07/24/20 2216 07/25/20 0729 07/25/20 0815 07/25/20 1153  GLUCAP 75 78 58* 66* 70   Chronic normocytic anemia -Chronic anemia likely due to chronic disease.  No overt bleeding noted.  Hemoglobin is stable.  Severe malnutrition in context of chronic illness Nutrition consulted. Note as below.  Stage II sacral pressure ulcer -Patient has 1cm by 1cm blister on the sacrum and a blister on left thigh and heel that's healing and scabbed over. -See wound care note.  Mobility: Patient has not been participating with physical therapy.  Patient prefers going home versus SNF. Code Status:   Code Status: Full Code  Nutritional status: Body mass index is 16.57 kg/m. Nutrition Problem: Severe Malnutrition Etiology: chronic illness Signs/Symptoms: energy intake < or equal to 75% for > or equal to 1 month, percent weight loss Percent weight loss: 30 % Diet Order            Diet regular Room service appropriate? No; Fluid consistency: Thin  Diet effective now                  DVT prophylaxis: apixaban (ELIQUIS) tablet 2.5 mg Start: 07/15/20 1315 SCDs Start: 07/09/2020 1530 apixaban (ELIQUIS) tablet 2.5 mg   Antimicrobials:  Cipro and Flagyl till 10/24. Fluid: LR at 75 mill per hour Consultants: Nephrology Family Communication:  None at bedside  Status is: Inpatient  Remains inpatient appropriate because of hypotension, AKI  Dispo:  Patient From: [place]  Planned Disposition: Home  Expected discharge date: 07/28/20  Medically stable for discharge: No    Infusions:  . lactated ringers 75 mL/hr at 07/25/20 6761    Scheduled Meds: . amiodarone  200 mg Oral Daily  . apixaban  2.5 mg Oral BID  . ascorbic acid  250 mg Oral BID  . cholecalciferol  1,000 Units Oral Daily  . ciprofloxacin  500 mg Oral Daily  . feeding supplement  237 mL Oral TID BM  . folic acid  1 mg Oral Daily  . hydrocortisone   Rectal TID  . levothyroxine  25 mcg Oral Q0600  . meclizine  12.5 mg Oral TID  . metroNIDAZOLE  500 mg Oral Q8H  . mirtazapine  7.5 mg Oral QHS  . multivitamin with minerals  1 tablet Oral Daily  . nortriptyline  25 mg Oral QHS  . nystatin   Topical BID  . pantoprazole  40 mg Oral Daily  . predniSONE  5  mg Oral Daily  . ramelteon  8 mg Oral QHS  . sodium bicarbonate  1,300 mg Oral TID  . sodium zirconium cyclosilicate  10 g Oral BID  . tacrolimus  1.5 mg Oral Q12H  . thiamine  100 mg Oral Daily  . zinc sulfate  220 mg Oral Daily    Antimicrobials: Anti-infectives (From admission, onward)   Start     Dose/Rate Route Frequency Ordered Stop   07/18/20 1100  ciprofloxacin (CIPRO) tablet 500 mg        500 mg Oral Daily 07/18/20 1005 07/26/20 2359   07/17/20 1400  metroNIDAZOLE (FLAGYL) tablet 500 mg        500 mg Oral Every 8 hours 07/17/20 1307 07/26/20 2359   07/16/20 1600  ceFEPIme (MAXIPIME) 2 g in sodium chloride 0.9 % 100 mL IVPB  Status:  Discontinued        2 g 200 mL/hr over 30 Minutes Intravenous Every 24 hours  07/16/20 1406 07/18/20 0922   07/15/20 0600  ceFAZolin (ANCEF) IVPB 2g/100 mL premix        2 g 200 mL/hr over 30 Minutes Intravenous On call to O.R. 07/09/2020 1210 07/18/2020 1335   07/04/2020 1347  vancomycin (VANCOCIN) powder  Status:  Discontinued          As needed 08/01/2020 1356 07/25/2020 1413   07/05/2020 1213  ceFAZolin (ANCEF) 2-4 GM/100ML-% IVPB       Note to Pharmacy: Henrine Screws   : cabinet override      07/27/2020 1213 07/21/2020 1344      PRN meds: acetaminophen, alum & mag hydroxide-simeth, dextrose, dicyclomine, diphenhydrAMINE, diphenhydrAMINE, naphazoline-glycerin, ondansetron **OR** ondansetron (ZOFRAN) IV, oxyCODONE, traMADol   Objective: Vitals:   07/24/20 2131 07/25/20 0535  BP: 110/63 112/65  Pulse: 90 87  Resp: 18 20  Temp: 98.2 F (36.8 C) 98.7 F (37.1 C)  SpO2: 100% 100%    Intake/Output Summary (Last 24 hours) at 07/25/2020 1437 Last data filed at 07/24/2020 1700 Gross per 24 hour  Intake 714.69 ml  Output --  Net 714.69 ml   Filed Weights   07/08/20 1130  Weight: 50.9 kg   Weight change:  Body mass index is 16.57 kg/m.   Physical Exam: General exam: Appears calm and comfortable.  Not in physical distress Skin: Still looks clinically dry.   HEENT: Atraumatic, normocephalic, supple neck, no obvious bleeding Lungs: Clear to auscultation bilaterally CVS: Regular rate and rhythm, no murmur GI/Abd soft, nontender, nondistended, bowel sound present CNS: Alert, awake, oriented x3 Psychiatry: Mood appropriate Extremities: Bandaged wounds bilaterally.  Has BKA wound on the right.  Data Review: I have personally reviewed the laboratory data and studies available.  Recent Labs  Lab 07/20/20 0412 07/21/20 0559 07/23/20 0153 07/24/20 0029 07/25/20 0137  WBC 8.8 11.8* 16.0* 13.4* 13.0*  NEUTROABS  --   --  14.1* 11.1* 11.4*  HGB 8.9* 9.5* 8.2* 8.8* 8.3*  HCT 28.5* 29.2* 25.5* 28.5* 25.8*  MCV 91.3 90.7 91.1 94.4 90.5  PLT 139* 230 130* 124* 116*    Recent Labs  Lab 07/21/20 0559 07/22/20 0338 07/23/20 0153 07/24/20 0029 07/25/20 0137  NA 145 143 142 142 141  K 5.0 4.2 4.2 4.2 4.0  CL 119* 115* 113* 113* 114*  CO2 18* 19* 18* 16* 18*  GLUCOSE 114* 189* 127* 117* 82  BUN 67* 67* 65* 69* 68*  CREATININE 3.59* 3.68* 3.75* 3.76* 3.81*  CALCIUM 8.3* 8.0* 8.1* 8.2* 8.2*   F/u  labs ordered.  Signed, Terrilee Croak, MD [facility] 07/25/2020

## 2020-07-25 NOTE — Plan of Care (Signed)

## 2020-07-25 NOTE — Progress Notes (Signed)
Pharmacy Antibiotic Note  Wanda [name] is a 59 y.o. female admitted on 08/02/2020 with wound infection.  Pharmacy has been consulted for Cipro dosing.  Of note, PMH includes CKD stage IV s/p renal transplant, and admission 7/30-8/30/2021 for septic shock secondary to a R foot ulcer with Klebsiella bacteremia complicated by endocarditis. 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  She returned to the ED 10/5 with a 1 week history of drainage and dehiscence of the stump site. Had I & D on 10/12 AKI- SCr increased at 3.59 (BL ~2.4).  **Cipro has a major interaction with tacrolimus. Pharmacologic effects and serum concentrations of tacrolimus may be increased by ciprofloxacin (weak CYP3A4 inhibitors).  -- Tacrolimus levels are being followed, level currently pending  Contacted MD to determine stop date, will end abx therapy on 10/24. Stop date entered.    Plan: Continue Cipro 500 mg PO every 24 hours Metronidazole per MD Stop date for abx therapy entered for 10/24, monitor clinical status Monitor clinical progress, cultures/sensitivities, renal function, abx plan Monitor tacrolimus levels while on Cipro   Height: 5\' 9"  (175.3 cm) (Pt stated) Weight: 50.9 kg (112 lb 3.4 oz) IBW/kg (Calculated) : 66.2  Temp (24hrs), Avg:98.3 F (36.8 C), Min:97.9 F (36.6 C), Max:98.7 F (37.1 C)  Recent Labs  Lab 07/20/20 0412 07/20/20 0412 07/21/20 0559 07/22/20 0338 07/23/20 0153 07/24/20 0029 07/25/20 0137  WBC 8.8  --  11.8*  --  16.0* 13.4* 13.0*  CREATININE 3.35*   < > 3.59* 3.68* 3.75* 3.76* 3.81*   < > = values in this interval not displayed.    Estimated Creatinine Clearance: 12.8 mL/min (A) (by C-G formula based on SCr of 3.81 mg/dL (H)).    Allergies  Allergen Reactions  . Propoxyphene N-Acetaminophen Other (See Comments)    Severe headache  . Hydrocodone-Acetaminophen Other (See Comments)     Severe headache   .  Astemizole Rash and Other (See Comments)    headache   . Cephalosporins Rash    Antimicrobials this admission: 10/12 Ancef x 1 (periop) 10/14 Cefepime >> 10/15 10/15 Flagyl >>10/24 10/16 Cipro >>10/24  Dose adjustments this admission:  Microbiology results: 10/12 wound culture: Kleb pneumo, enterobacter cloacae, pseudomonas  10/5 BCx: ngf 10/5 RVP: COVID +  10/7 MRSA PCR: neg   Thank you for allowing [place] to participate in this patients care.   Cephus Slater, PharmD, [facility] Resident [phone] 07/25/2020 9:42 AM

## 2020-07-25 NOTE — Progress Notes (Signed)
Admit: 07/11/2020 LOS: 33  38F s/p KT 2020 with CKD4T Tac/Pred; admitted with dehisced and infected recent right BKA now on broad-spectrum antibiotics; persistent hyperkalemia and metabolic acidosis.  Subjective:  . Diarrhea resolved, laxatives stopped. Still little to no PO intake, she is trying her hardest. Still feels very dehydrated  10/22 0701 - 10/23 0700 In: 714.7 [I.V.:425; IV Piggyback:289.7] Out: -   Filed Weights   07/08/20 1130  Weight: 50.9 kg    Scheduled Meds: . amiodarone  200 mg Oral Daily  . apixaban  2.5 mg Oral BID  . ascorbic acid  250 mg Oral BID  . cholecalciferol  1,000 Units Oral Daily  . ciprofloxacin  500 mg Oral Daily  . feeding supplement  237 mL Oral TID BM  . folic acid  1 mg Oral Daily  . hydrocortisone   Rectal TID  . levothyroxine  25 mcg Oral Q0600  . meclizine  12.5 mg Oral TID  . metroNIDAZOLE  500 mg Oral Q8H  . mirtazapine  7.5 mg Oral QHS  . multivitamin with minerals  1 tablet Oral Daily  . nortriptyline  25 mg Oral QHS  . nystatin   Topical BID  . pantoprazole  40 mg Oral Daily  . predniSONE  5 mg Oral Daily  . ramelteon  8 mg Oral QHS  . sodium bicarbonate  1,300 mg Oral TID  . sodium zirconium cyclosilicate  10 g Oral BID  . tacrolimus  1.5 mg Oral Q12H  . thiamine  100 mg Oral Daily  . zinc sulfate  220 mg Oral Daily   Continuous Infusions: . lactated ringers 75 mL/hr at 07/25/20 0714   PRN Meds:.acetaminophen, alum & mag hydroxide-simeth, dextrose, dicyclomine, diphenhydrAMINE, diphenhydrAMINE, naphazoline-glycerin, ondansetron **OR** ondansetron (ZOFRAN) IV, oxyCODONE, traMADol  Current Labs: reviewed   Physical Exam:  Blood pressure 112/65, pulse 87, temperature 98.7 F (37.1 C), temperature source Oral, resp. rate 20, height 5\' 9"  (1.753 m), weight 50.9 kg, SpO2 100 %. GEN: chronically ill appearing, NAD ENT: temporal wasting, dry mucosal membranes EYES: EOMI CV: Regular, nl s1s2 PULM: CTAB ABD: soft, nt/nd SKIN:  RLE bandagd EXT:No sig LLE edema. Neuro: no focal deficits  A 1. CKD 4T: Probably at her baseline, Continue current transplant regimen. Follows with Dr. Elmarie Shiley 2. Hyperkalemia: Likely from a type IV RTA and low GFR; stable if K is less than 6, continue twice daily lokelma, restrict K in diet, see #3 3. 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 DM2 6. AFib 7. Anemia, Hb lower today, mgmt per primary service 8. HTN, BP stable 9. Constipation with n/v 10. Urinary retention 11. Diarrhea  P . Cont supportive care, continue fluids. Diarrhea better. Still poor PO intake . Continue serial bladder scans, if consistently retaining then would recommend a foley placement . Labs reviewed, renal function stable. No indication for renal replacement therapy . Tac trough drawn 10/19, result pending . Continue outpatient immunosuppression of tacrolimus and prednisone. Transplant ultrasound reviewed . Medication Issues; o Preferred narcotic agents for pain control are hydromorphone, fentanyl, and methadone. Morphine should not be used.  o Baclofen should be avoided o Avoid oral sodium phosphate and magnesium citrate based laxatives / bowel preps    Gean Quint, MD [facility] 07/25/2020, 1:11 PM  Recent Labs  Lab 07/23/20 0153 07/24/20 0029 07/25/20 0137  NA  142 142 141  K 4.2 4.2 4.0  CL 113* 113* 114*  CO2 18* 16* 18*  GLUCOSE 127* 117* 82  BUN 65* 69* 68*  CREATININE 3.75* 3.76* 3.81*  CALCIUM 8.1* 8.2* 8.2*   Recent Labs  Lab 07/23/20 0153 07/24/20 0029 07/25/20 0137  WBC 16.0* 13.4* 13.0*  NEUTROABS 14.1* 11.1* 11.4*  HGB 8.2* 8.8* 8.3*  HCT 25.5* 28.5* 25.8*  MCV 91.1 94.4 90.5  PLT 130* 124* 116*

## 2020-07-26 DIAGNOSIS — Z66 Do not resuscitate: Secondary | ICD-10-CM | POA: Diagnosis not present

## 2020-07-26 DIAGNOSIS — T8130XA Disruption of wound, unspecified, initial encounter: Secondary | ICD-10-CM | POA: Diagnosis not present

## 2020-07-26 DIAGNOSIS — Z515 Encounter for palliative care: Secondary | ICD-10-CM

## 2020-07-26 DIAGNOSIS — Z7189 Other specified counseling: Secondary | ICD-10-CM | POA: Diagnosis not present

## 2020-07-26 LAB — BASIC METABOLIC PANEL
Anion gap: 11 (ref 5–15)
BUN: 68 mg/dL — ABNORMAL HIGH (ref 6–20)
CO2: 19 mmol/L — ABNORMAL LOW (ref 22–32)
Calcium: 8.1 mg/dL — ABNORMAL LOW (ref 8.9–10.3)
Chloride: 112 mmol/L — ABNORMAL HIGH (ref 98–111)
Creatinine, Ser: 3.63 mg/dL — ABNORMAL HIGH (ref 0.44–1.00)
GFR, Estimated: 14 mL/min — ABNORMAL LOW (ref >60)
Glucose, Bld: 73 mg/dL (ref 70–99)
Potassium: 4.2 mmol/L (ref 3.5–5.1)
Sodium: 142 mmol/L (ref 135–145)

## 2020-07-26 LAB — GLUCOSE, CAPILLARY
Glucose-Capillary: 60 mg/dL — ABNORMAL LOW (ref 70–99)
Glucose-Capillary: 60 mg/dL — ABNORMAL LOW (ref 70–99)
Glucose-Capillary: 86 mg/dL (ref 70–99)
Glucose-Capillary: 87 mg/dL (ref 70–99)
Glucose-Capillary: 79 mg/dL (ref 70–99)

## 2020-07-26 LAB — TACROLIMUS LEVEL: Tacrolimus (FK506) - LabCorp: 9.2 ng/mL (ref 2.0–20.0)

## 2020-07-26 MED ORDER — POLYETHYLENE GLYCOL 3350 17 G PO PACK: 17.0000 g | PACK | Freq: Every day | ORAL | Status: DC | PRN

## 2020-07-26 MED ORDER — SENNOSIDES-DOCUSATE SODIUM 8.6-50 MG PO TABS
1.0000 | ORAL_TABLET | Freq: Every day | ORAL | Status: DC
  Administered 2020-07-26 – 2020-07-28 (×3): 1 via ORAL
  Filled 2020-07-26 (×3): qty 1

## 2020-07-26 MED ORDER — LACTATED RINGERS IV SOLN: INTRAVENOUS | Status: DC

## 2020-07-26 NOTE — Progress Notes (Signed)
Admit: 07/19/2020 LOS: 51  49F s/p KT 2020 with CKD4T Tac/Pred; admitted with dehisced and infected recent right BKA now on broad-spectrum antibiotics; persistent hyperkalemia and metabolic acidosis.  Subjective:  . Still feels dehydrated, no complaints. Still does not have adequate PO intake  10/23 0701 - 10/24 0700 In: 1780.8 [I.V.:1780.8] Out: 50 [Urine:50]  Filed Weights   07/08/20 1130  Weight: 50.9 kg    Scheduled Meds: . amiodarone  200 mg Oral Daily  . apixaban  2.5 mg Oral BID  . ascorbic acid  250 mg Oral BID  . cholecalciferol  1,000 Units Oral Daily  . feeding supplement  237 mL Oral TID BM  . folic acid  1 mg Oral Daily  . hydrocortisone   Rectal TID  . levothyroxine  25 mcg Oral Q0600  . meclizine  12.5 mg Oral TID  . metroNIDAZOLE  500 mg Oral Q8H  . mirtazapine  7.5 mg Oral QHS  . multivitamin with minerals  1 tablet Oral Daily  . nortriptyline  25 mg Oral QHS  . nystatin   Topical BID  . pantoprazole  40 mg Oral Daily  . predniSONE  5 mg Oral Daily  . ramelteon  8 mg Oral QHS  . sodium bicarbonate  1,300 mg Oral TID  . sodium zirconium cyclosilicate  10 g Oral BID  . tacrolimus  1.5 mg Oral Q12H  . thiamine  100 mg Oral Daily  . zinc sulfate  220 mg Oral Daily   Continuous Infusions: . lactated ringers 75 mL/hr at 07/26/20 0654   PRN Meds:.acetaminophen, alum & mag hydroxide-simeth, dextrose, dicyclomine, diphenhydrAMINE, diphenhydrAMINE, naphazoline-glycerin, ondansetron **OR** ondansetron (ZOFRAN) IV, oxyCODONE, traMADol  Current Labs: reviewed   Physical Exam:  Blood pressure 98/65, pulse 78, temperature 98.8 F (37.1 C), temperature source Oral, resp. rate 19, height 5\' 9"  (1.753 m), weight 50.9 kg, SpO2 100 %. GEN: chronically ill appearing, NAD ENT: temporal wasting, dry mucosal membranes EYES: EOMI CV: Regular, nl s1s2 PULM: CTAB ABD: soft, nt/nd SKIN: poor skin turgor EXT:No sig LLE edema. Neuro: no focal deficits  A 1. CKD 4, h/o  DDKT (12/30/5186, complicated by DGF): Probably at her new baseline, Continue current transplant regimen. Follows with Dr. Elmarie Shiley 2. Hyperkalemia: Likely from a type IV RTA and low GFR; stable if K is less than 6, continue twice daily lokelma, restrict K in diet, see #3 3. 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 DM2 6. AFib 7. Anemia, Hb lower today, mgmt per primary service 8. HTN, BP stable 9. Constipation with n/v 10. Urinary retention 11. Diarrhea  P . Cont supportive care, continue fluids. Still poor PO intake. Cr stable/slightly better today. . Continue serial bladder scans, if consistently retaining then would recommend a foley placement . If K stable or better can decrease lokelma from BID to daily . Tac trough drawn 10/19, result pending . Continue outpatient immunosuppression of tacrolimus and prednisone. Transplant ultrasound reviewed . Medication Issues; o Preferred narcotic agents for pain control are hydromorphone, fentanyl, and methadone. Morphine should not be used.  o Baclofen should be avoided o Avoid oral sodium phosphate and magnesium citrate based laxatives / bowel preps    Gean Quint, MD [place] 07/26/2020, 11:14 AM  Recent Labs  Lab 07/24/20 0029 07/25/20 0137 07/26/20 0100  NA 142 141 142  K 4.2  4.0 4.2  CL 113* 114* 112*  CO2 16* 18* 19*  GLUCOSE 117* 82 73  BUN 69* 68* 68*  CREATININE 3.76* 3.81* 3.63*  CALCIUM 8.2* 8.2* 8.1*   Recent Labs  Lab 07/23/20 0153 07/24/20 0029 07/25/20 0137  WBC 16.0* 13.4* 13.0*  NEUTROABS 14.1* 11.1* 11.4*  HGB 8.2* 8.8* 8.3*  HCT 25.5* 28.5* 25.8*  MCV 91.1 94.4 90.5  PLT 130* 124* 116*

## 2020-07-26 NOTE — Plan of Care (Signed)

## 2020-07-26 NOTE — Plan of Care (Signed)

## 2020-07-26 NOTE — Progress Notes (Signed)
Hydrologist [facility])  Hospital Liaison: RN note         Notified by Tacey Ruiz NP of patient/family request for [facility] Palliative services at home after discharge.             [place] Palliative team will follow up with patient after discharge.         Please call with any hospice or palliative related questions.         Thank you for this referral.         Farrel Gordon, RN, CCM  [place] (listed on [place] under Hospice[facility])    [phone]

## 2020-07-26 NOTE — Consult Note (Addendum)
Palliative Medicine Inpatient Consult Note  Reason for consult:  Goals of Care  HPI:  Per intake H&P --> Wanda [name] is a 59 y.o. female with PMH of kidney transplant 2020 with CKD 4T Tac/Pred, chronic anemia, gastric sleeve, GERD, hypoalbuminemia, DM2, HTN, PAD, hypothyroidism and chronic neuropathy. Patient presented to the ED on 07/22/2020 with wound dehiscence of recent right BKA stump. Patient recently had a prolonged hospitalization 05/01/2020-06/01/2020 in septic shock secondary to a R foot ulcer with Klebsiella bacteremia complicated by endocarditis. 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  Per ID recommendation, patient completed 6 weeks of IV meropenem on 9/25. She returned to the ED 10/5 with a 1 week history of drainage and dehiscence of the stump site.  Palliative care knows Wanda [name] from her prior hospital admission.  During that time there were extensive conversations which circulated around her ongoing failure to thrive and code status.  Patient's husband was intermittently quite upset that the patient was not eating sufficiently.  She was identified to not be a candidate for G-tube placement given her prior history of a gastric sleeve.   Clinical Assessment/Goals of Care: I have reviewed medical records including EPIC notes, labs and imaging. I met with Wanda [name] at bedside.She was noted to be easily arousable and oriented to person place and time.   I met with Wanda [name] to furtherdiscuss diagnosis prognosis, GOC, EOL wishes, disposition and options.  I introduced Palliative Medicine as specialized medical care for people living with serious illness. It focuses on providing relief from the symptoms and stress of a serious illness. The goal is to improve quality of life for both the patient and the family.  I asked Wanda [name] to tell me about herself. She states that she is from [place], she has lived here her whole life. She has been married for 62 one years to her  husband, Wanda [name]. She has three children, two sons and one daughter. All of her children live locally. She use to work as a Teacher, early years/pre. She considers her family to be the most important thing in her life. She is a woman of faith and practices within the [facility] denomination.   In terms of bADLs patient is unable to perform any activities of daily living for herself other than feeding herself once the meal is set up in front of her. She has not been mobilizing due to her "infected leg."  Is a lot of concern that perhaps she can no longer mobilize per conversation.  I asked Wanda [name] how her health had been since I had seen her about 2 months ago.  She states that it has been difficult due to her right lower extremity causing many issues.  She shares that her appetite remains to be relatively poor though she is "trying".  We talked again about the reality that a G-tube is not an option for her given her gastric sleeve which she did vocalize understanding of.  We discussed medications that she is on, mirtazapine, which ideally would help to increase her overall appetite and improve her mood.  I introduced the topic of frailty given her ongoing failure to thrive, decreased muscle mass, and generalized weakness.  I shared with her that when frail health outcomes can often be less than optimal.  We discussed how frailty is not necessarily just based on age but can be based on a variety of other factors which play into someone's overall health picture.  I shared with Wanda [name] that I  do worry that in the long run she may not fare well which she did understand.  A detailed discussion was had today regarding advanced directives -->  She would be interested though in completing advanced directives while here this time around. Patients husband - Wanda [name] is her surrogate Media planner.  Concepts specific to code status, artifical feeding and hydration, continued IV antibiotics and rehospitalization was had.   During our conversation we focused quite a bit on this.  Ultimately Wanda [name] decided that a life of artificial support which is the undoubtably outcome of undergoing a full resuscitative effort would be incongruent with what she would want for herself.  She would not want to be attached to machines, wires, or probes.  She shares that she would rather elect to have a natural passing when that time comes. I completed a MOST form today. The patient and family outlined their wishes for the following treatment decisions:  Cardiopulmonary Resuscitation: Do Not Attempt Resuscitation (DNR/No CPR)  Medical Interventions: Limited Additional Interventions: Use medical treatment, IV fluids and cardiac monitoring as indicated, DO NOT USE intubation or mechanical ventilation. May consider use of less invasive airway support such as BiPAP or CPAP. Also provide comfort measures. Transfer to the hospital if indicated. Avoid intensive care.   Antibiotics: Antibiotics if indicated  IV Fluids: IV fluids if indicated  Feeding Tube: No feeding tube   The difference between a aggressive medical intervention path and a palliative comfort care path for this patient at this time was had.  We talked about Wanda [name]'s decline over the past three or so months and how things have been exceptionally tumultuous.  I did again introduced the topic of hospice.  I described hospice as a service for patients for have a life expectancy of < 6 months. It preserves dignity and quality at the end phases of life. The focus changes from curative to symptom relief.  Wanda [name] stated that she was not interested in hospice mainly because she wants to get better.  I shared with her that I am very much aware of the fact that she wants to get better though I do have some concern about her body's ability to recuperate from all of the insults it has undergone in the last few months.  As of this point in time Wanda [name] would like to get home to spend time with  her family.  I asked her if it would be reasonable for a palliative care provider at the very least to see her once a month or so to get an impression as to how things were going at home.  She did state that she was open to this.  Discussed the importance of continued conversation with family and their  medical providers regarding overall plan of care and treatment options, ensuring decisions are within the context of the patients values and GOCs. ___________________________________________________________ Addendum: I received a message from Wanda [name], [place] bedside nurse that her daughter Wanda [name] requested a call from the palliative care team to further discuss CODE STATUS.  I was able to call Wanda [name] this evening and share with her that Wanda [name] had elected to be a DO NOT RESUSCITATE.  Wanda [name] expressed that she does not know why she is now a DO NOT RESUSCITATE and that she has a desire to live.  I shared with Wanda [name] that I agree, the patient does in fact have a desire to live and that has not changed rather the only difference is that if we are in a position  where her heart stop she would not want aggressive measures like cardiopulmonary resuscitation and/or intubation to keep her alive.  Rather she would want to pass away peacefully.  Unfortunately as this conversation was occurring it appeared that the phone on the other line may have gone dead.  I attempted to call back though it went right to voicemail and the voice mailbox was full.  I will request one of my team members to follow-up with Wanda [name]'s case tomorrow given the importance of these conversations and of goals of care conversations moving forward.   At this point time it is important to recognize that patient is alert and oriented x3 and she is able to make the determination of whether or not she would want to be resuscitated.  Decision Maker: Patient is able to make decisions for herself though if unable to do so she would rely on  husband, Wanda [name] (Husband) [phone]  SUMMARY OF RECOMMENDATIONS DNAR/DNI  MOST Completed, paper copy placed onto the chart electric copy can be found in [facility]  DNR Form Completed, paper copy placed onto the chart electric copy can be found in [facility]  TOC - OP Palliative support for ongoing [place] conversations  Spiritual Support - Patient is [place] team will continue to follow and aid patient and family throughout this difficult time  Code Status/Advance Care Planning: DNAR/DNI   Palliative Prophylaxis:   Oral care, nutrition, range of motion, aspiration precautions  Additional Recommendations (Limitations, Scope, Preferences): Treat what is treatable   Psycho-social/Spiritual:   Desire for further Chaplaincy support:  Yes-patient is [facility]   additional Recommendations:  Education provided on failure to thrive, frailty, hospice   Prognosis:  Given recurrent hospitalizations, multitude of comorbid conditions and overall generalized decline patient has a high 80-monthmortality risk.  Discharge Planning:  Discharge home with home health and outpatient palliative support  Vitals:   07/25/20 2059 07/26/20 0541  BP: 97/75 98/65  Pulse: 88 78  Resp: 17 19  Temp: 98 F (36.7 C) 98.8 F (37.1 C)  SpO2: 99% 100%    Intake/Output Summary (Last 24 hours) at 07/26/2020 1421 Last data filed at 07/26/2020 0700 Gross per 24 hour  Intake 1780.75 ml  Output 50 ml  Net 1730.75 ml   Last Weight  Most recent update: 07/08/2020 12:34 PM   Weight  50.9 kg (112 lb 3.4 oz)           Gen:  Cachectic AA F in NAD HEENT: (+) temporal wasting, dry  mucous membranes CV: Regular rate and rhythm, no murmurs rubs or gallops PULM: clear to auscultation bilaterally  ABD: soft/nontender  Neuro: Alert and oriented x3  PPS: 20%-30%   This conversation/these recommendations were discussed with patient primary care team, Dr. DPietro Cassis Time In: 1345 Time Out:  1455 Total Time: 70 Greater than 50%  of this time was spent counseling and coordinating care related to the above assessment and plan.  [place] Cell Phone: [phone] utilize secure chat with additional questions, if there is no response within 30 minutes please call the above phone number  Palliative Medicine Team providers are available by phone from 7am to 7pm daily and can be reached through the team cell phone.  Should this patient require assistance outside of these hours, please call the patient's attending physician.

## 2020-07-26 NOTE — Progress Notes (Signed)
PROGRESS NOTE  Wanda [name]  DOB: 1961-06-01  PCP: Wanda Dy, MD DVV:[id number]  DOA: 07/25/2020  LOS: 18 days   Chief Complaint  Patient presents with  . Wound Dehiscence    Brief narrative: Wanda [name] is a 59 y.o. female with PMH of kidney transplant 2020 with CKD 4T Tac/Pred, chronic anemia, gastric sleeve, GERD, hypoalbuminemia, DM2, HTN, PAD, hypothyroidism and chronic neuropathy. Patient presented to the ED on 07/15/2020 with wound dehiscence of recent right BKA stump. Patient recently had a prolonged hospitalization 05/01/2020-06/01/2020 in septic shock secondary to a R foot ulcer with Klebsiella bacteremia complicated by endocarditis.  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  Per ID recommendation, patient completed 6 weeks of IV meropenem on 9/25.  She returned to the ED 10/5 with a 1 week history of drainage and dehiscence of the stump site.  In the ED the patient was found to have an elevated WBC.   X-ray right tibia/fibula was without evidence of osseous abnormality.   She was found to be hyperkalemic at 6.0 and in acute renal failure with creatinine 3.4 up from baseline of 2.4.   She was admitted to hospitalist service. 10/12, she underwent wound revision in the OR.  Her hospitalization was prolonged because of progressive renal failure, persistent hyperkalemia, metabolic acidosis and severe constipation.  Subjective: Patient was seen and examined this morning.   Lying on bed.  Poor oral intake.  Feels dehydrated. No bowel movement since Wednesday.  States she is making urine output.  Assessment/Plan: R BKA stump site dehiscence/wound infection -X-ray without evidence to suggest osteomyelitis. Blood cultures without any growth so far. -10/12, Ortho performed irrigation and excisional debridement of right leg wound w/ intermediate closure. -Wound cultures grew out Klebsiella. Based on ID recommendations patient is now on Cipro and Flagyl. Last day of antibiotics  is today 10/24. -She is currently in nonweightbearing status on the right lower extremity.  Recent history of endocarditis  -Patient recently completed 6 weeks course of IV meropenem on 9/25.  Repeat blood culture on this admission was negative.  -Repeat echocardiogram on this admission on 10/14 showed an echogenic mobile mass on the mitral valve.  Per cardiology, this is not a vegetation and likely likely represents a segment of redundant mitral subvalvular patterns.  TEE was not recommended.  Acute kidney injury h/o kidney transplant 2020 with CKD4 on transplanted kidney -Baseline creatinine 2.1-2.4 -Creatinine gradually trended up to peak at 3.81, slightly better at 3.63 today. Nephrology following. -Continue LR at 75 mL/h. -Continue tacrolimus and prednisone as before. 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 -Continue sodium bicarb 1300 mg 3 times daily.  Hyperkalemia -Gradually improving.  In normal range today. -Continue twice daily Lokelma, restrict potassium in diet. Recent Labs  Lab 07/22/20 0338 07/23/20 0153 07/24/20 0029 07/25/20 0137 07/26/20 0100  K 4.2 4.2 4.2 4.0 4.2   Hypotension -Secondary to volume depletion. -With IV resuscitation, blood pressure is gradually improving.  -Cosyntropin stimulation test shows adequate response. -On IV hydration per nephrology.  Constipation alternating with diarrhea -Reports no bowel movement for last 4 days.  Resume scheduled Senokot and as needed MiraLAX.  Hypothyroidism -Reported history of such but is not on thyroid hormone replacement.  -10/6, TSH elevated 24 but free T4 was normal.   Patient was initiated on thyroid supplement which we will continue. -TSH needs to be checked in 6 to 8 weeks.  Acute urinary retention -10/19, patient was noted to have greater than 400 mL of urine in her bladder overnight.  And in and out catheterization was done.   -Patient reports he is still voiding now.  Continue bladder scan.  Vertigo -Seems to have resolved.  Continue meclizine.  She will need to follow-up with ENT for her ear issues.    Recent COVID -Diagnosed with Covid 9/10 while at [facility] and quarantined for 14 days. Last day of isolation was 10/1.  -Chest x-ray 10/5 noted resolved bilateral lung opacities.  Respiratory status is stable.  Paroxysmal atrial fibrillation -Currently on amiodarone and Eliquis. Currently in sinus rhythm. -Per pharmacy, Eliquis is not recommended in patients with creatinine clearance less than 15.  We'll keep it on hold for now with a plan to resume once creatinine clearance improved to more than 15.  History of type 2 diabetes Hypoglycemic episodes -A1c 4.2 on 10/7. Not on diabetes medications at home. -Cosyntropin stimulation test results do not suggest adrenal insufficiency. Recent Labs  Lab 07/25/20 1558 07/25/20 2103 07/26/20 0759 07/26/20 1159 07/26/20 1303  GLUCAP 95 92 60* 60* 86   Chronic normocytic anemia -Chronic anemia likely due to chronic disease.  No overt bleeding noted.  Hemoglobin is stable.  Severe malnutrition in context of chronic illness Nutrition consulted. Note as below.  Stage II sacral pressure ulcer -Patient has 1cm by 1cm blister on the sacrum and a blister on left thigh and heel that's healing and scabbed over. -See wound care note.  Goals of care -Patient is pretty weak for self-care.  She doesn't want to consider SNF.  She is at high risk of readmission.  Remains full code at this time.  I order for palliative care consultation.  Mobility: Patient has not been participating with  physical therapy.  Patient prefers going home versus SNF. Code Status:   Code Status: Full Code  Nutritional status: Body mass index is 16.57 kg/m. Nutrition Problem: Severe Malnutrition Etiology: chronic illness Signs/Symptoms: energy intake < or equal to 75% for > or equal to 1 month, percent weight loss Percent weight loss: 30 % Diet Order            Diet regular Room service appropriate? No; Fluid consistency: Thin  Diet effective now                 DVT prophylaxis: apixaban (ELIQUIS) tablet 2.5 mg Start: 07/15/20 1315 SCDs Start: 07/27/2020 1530 apixaban (ELIQUIS) tablet 2.5 mg   Antimicrobials:  Cipro and Flagyl till 10/24. Fluid: LR at 75 mill per hour Consultants: Nephrology Family Communication:  None at bedside  Status is: Inpatient  Remains inpatient appropriate because of hypotension, AKI  Dispo:  Patient From: [place]  Planned Disposition: Home  Expected discharge date: 07/28/20  Medically stable for discharge: No    Infusions:  . lactated ringers      Scheduled Meds: . amiodarone  200 mg Oral Daily  . apixaban  2.5 mg Oral BID  . ascorbic acid  250 mg Oral BID  . cholecalciferol  1,000 Units Oral Daily  . feeding supplement  237 mL Oral TID BM  . folic acid  1 mg Oral Daily  . hydrocortisone  Rectal TID  . levothyroxine  25 mcg Oral Q0600  . meclizine  12.5 mg Oral TID  . metroNIDAZOLE  500 mg Oral Q8H  . mirtazapine  7.5 mg Oral QHS  . multivitamin with minerals  1 tablet Oral Daily  . nortriptyline  25 mg Oral QHS  . nystatin   Topical BID  . pantoprazole  40 mg Oral Daily  . predniSONE  5 mg Oral Daily  . ramelteon  8 mg Oral QHS  . senna-docusate  1 tablet Oral QHS  . sodium bicarbonate  1,300 mg Oral TID  . sodium zirconium cyclosilicate  10 g Oral BID  . tacrolimus  1.5 mg Oral Q12H  . thiamine  100 mg Oral Daily  . zinc sulfate  220 mg Oral Daily    Antimicrobials: Anti-infectives (From admission, onward)    Start     Dose/Rate Route Frequency Ordered Stop   07/18/20 1100  ciprofloxacin (CIPRO) tablet 500 mg        500 mg Oral Daily 07/18/20 1005 07/26/20 0904   07/17/20 1400  metroNIDAZOLE (FLAGYL) tablet 500 mg        500 mg Oral Every 8 hours 07/17/20 1307 07/26/20 2359   07/16/20 1600  ceFEPIme (MAXIPIME) 2 g in sodium chloride 0.9 % 100 mL IVPB  Status:  Discontinued        2 g 200 mL/hr over 30 Minutes Intravenous Every 24 hours 07/16/20 1406 07/18/20 0922   07/15/20 0600  ceFAZolin (ANCEF) IVPB 2g/100 mL premix        2 g 200 mL/hr over 30 Minutes Intravenous On call to O.R. 07/19/2020 1210 07/15/2020 1335   08/02/2020 1347  vancomycin (VANCOCIN) powder  Status:  Discontinued          As needed 07/31/2020 1356 07/13/2020 1413   07/03/2020 1213  ceFAZolin (ANCEF) 2-4 GM/100ML-% IVPB       Note to Pharmacy: Henrine Screws   : cabinet override      08/02/2020 1213 07/08/2020 1344      PRN meds: acetaminophen, alum & mag hydroxide-simeth, dextrose, dicyclomine, diphenhydrAMINE, diphenhydrAMINE, naphazoline-glycerin, ondansetron **OR** ondansetron (ZOFRAN) IV, oxyCODONE, polyethylene glycol, traMADol   Objective: Vitals:   07/25/20 2059 07/26/20 0541  BP: 97/75 98/65  Pulse: 88 78  Resp: 17 19  Temp: 98 F (36.7 C) 98.8 F (37.1 C)  SpO2: 99% 100%    Intake/Output Summary (Last 24 hours) at 07/26/2020 1349 Last data filed at 07/26/2020 0700 Gross per 24 hour  Intake 1780.75 ml  Output 50 ml  Net 1730.75 ml   Filed Weights   07/08/20 1130  Weight: 50.9 kg   Weight change:  Body mass index is 16.57 kg/m.   Physical Exam: General exam: Chronic sick looking, denies any physical distress Skin: Still looks clinically dry.   HEENT: Atraumatic, normocephalic, supple neck, no obvious bleeding Lungs: Clear to auscultation bilaterally CVS: Regular rate and rhythm, no murmur GI/Abd soft, nontender, nondistended, bowel sound present CNS: Alert, awake, oriented x3 Psychiatry: Mood  appropriate Extremities: Bandaged wounds bilaterally.  Has BKA wound on the right.  Data Review: I have personally reviewed the laboratory data and studies available.  Recent Labs  Lab 07/20/20 0412 07/21/20 0559 07/23/20 0153 07/24/20 0029 07/25/20 0137  WBC 8.8 11.8* 16.0* 13.4* 13.0*  NEUTROABS  --   --  14.1* 11.1* 11.4*  HGB 8.9* 9.5* 8.2* 8.8* 8.3*  HCT 28.5* 29.2* 25.5* 28.5* 25.8*  MCV 91.3 90.7 91.1 94.4 90.5  PLT 139* 230 130* 124* 116*   Recent Labs  Lab 07/22/20 0338 07/23/20 0153 07/24/20 0029 07/25/20 0137 07/26/20 0100  NA 143 142 142 141 142  K 4.2 4.2 4.2 4.0 4.2  CL 115* 113* 113* 114* 112*  CO2 19* 18* 16* 18* 19*  GLUCOSE 189* 127* 117* 82 73  BUN 67* 65* 69* 68* 68*  CREATININE 3.68* 3.75* 3.76* 3.81* 3.63*  CALCIUM 8.0* 8.1* 8.2* 8.2* 8.1*   F/u labs ordered.  Signed, Terrilee Croak, MD [facility] 07/26/2020

## 2020-07-27 ENCOUNTER — Telehealth: Payer: Self-pay | Admitting: *Deleted

## 2020-07-27 DIAGNOSIS — E8809 Other disorders of plasma-protein metabolism, not elsewhere classified: Secondary | ICD-10-CM

## 2020-07-27 DIAGNOSIS — N170 Acute kidney failure with tubular necrosis: Secondary | ICD-10-CM | POA: Diagnosis not present

## 2020-07-27 DIAGNOSIS — N184 Chronic kidney disease, stage 4 (severe): Secondary | ICD-10-CM

## 2020-07-27 DIAGNOSIS — R54 Age-related physical debility: Secondary | ICD-10-CM

## 2020-07-27 DIAGNOSIS — T8130XA Disruption of wound, unspecified, initial encounter: Secondary | ICD-10-CM | POA: Diagnosis not present

## 2020-07-27 LAB — URINALYSIS, ROUTINE W REFLEX MICROSCOPIC
Bilirubin Urine: NEGATIVE
Glucose, UA: NEGATIVE mg/dL
Ketones, ur: NEGATIVE mg/dL
Nitrite: NEGATIVE
Protein, ur: 100 mg/dL — AB
Specific Gravity, Urine: 1.014 (ref 1.005–1.030)
WBC, UA: >50 WBC/hpf — ABNORMAL HIGH (ref 0–5)
pH: 5 (ref 5.0–8.0)

## 2020-07-27 LAB — BASIC METABOLIC PANEL
Anion gap: 11 (ref 5–15)
BUN: 70 mg/dL — ABNORMAL HIGH (ref 6–20)
CO2: 19 mmol/L — ABNORMAL LOW (ref 22–32)
Calcium: 8.1 mg/dL — ABNORMAL LOW (ref 8.9–10.3)
Chloride: 113 mmol/L — ABNORMAL HIGH (ref 98–111)
Creatinine, Ser: 3.84 mg/dL — ABNORMAL HIGH (ref 0.44–1.00)
GFR, Estimated: 13 mL/min — ABNORMAL LOW (ref >60)
Glucose, Bld: 70 mg/dL (ref 70–99)
Potassium: 4.6 mmol/L (ref 3.5–5.1)
Sodium: 143 mmol/L (ref 135–145)

## 2020-07-27 LAB — CBC WITH DIFFERENTIAL/PLATELET
Abs Immature Granulocytes: 0.08 10*3/uL — ABNORMAL HIGH (ref 0.00–0.07)
Basophils Absolute: 0 10*3/uL (ref 0.0–0.1)
Basophils Relative: 0 %
Eosinophils Absolute: 0 10*3/uL (ref 0.0–0.5)
Eosinophils Relative: 0 %
HCT: 24 % — ABNORMAL LOW (ref 36.0–46.0)
Hemoglobin: 7.4 g/dL — ABNORMAL LOW (ref 12.0–15.0)
Immature Granulocytes: 1 %
Lymphocytes Relative: 9 %
Lymphs Abs: 1.2 10*3/uL (ref 0.7–4.0)
MCH: 29.4 pg (ref 26.0–34.0)
MCHC: 30.8 g/dL (ref 30.0–36.0)
MCV: 95.2 fL (ref 80.0–100.0)
Monocytes Absolute: 0.4 10*3/uL (ref 0.1–1.0)
Monocytes Relative: 3 %
Neutro Abs: 12.4 10*3/uL — ABNORMAL HIGH (ref 1.7–7.7)
Neutrophils Relative %: 87 %
Platelets: 123 10*3/uL — ABNORMAL LOW (ref 150–400)
RBC: 2.52 MIL/uL — ABNORMAL LOW (ref 3.87–5.11)
RDW: 21.6 % — ABNORMAL HIGH (ref 11.5–15.5)
WBC: 14.1 10*3/uL — ABNORMAL HIGH (ref 4.0–10.5)
nRBC: 0.2 % (ref 0.0–0.2)

## 2020-07-27 LAB — GLUCOSE, CAPILLARY
Glucose-Capillary: 65 mg/dL — ABNORMAL LOW (ref 70–99)
Glucose-Capillary: 76 mg/dL (ref 70–99)
Glucose-Capillary: 84 mg/dL (ref 70–99)
Glucose-Capillary: 71 mg/dL (ref 70–99)
Glucose-Capillary: 75 mg/dL (ref 70–99)

## 2020-07-27 MED ORDER — DARBEPOETIN ALFA 150 MCG/0.3ML IJ SOSY
150.0000 ug | PREFILLED_SYRINGE | INTRAMUSCULAR | Status: DC
  Administered 2020-07-27: 150 ug via SUBCUTANEOUS
  Filled 2020-07-27: qty 0.3

## 2020-07-27 MED ORDER — MECLIZINE HCL 25 MG PO TABS: 12.5000 mg | ORAL_TABLET | Freq: Three times a day (TID) | ORAL | Status: DC | PRN

## 2020-07-27 MED ORDER — SODIUM ZIRCONIUM CYCLOSILICATE 10 G PO PACK
10.0000 g | PACK | Freq: Every day | ORAL | Status: DC
  Administered 2020-07-28: 10 g via ORAL
  Filled 2020-07-27: qty 1

## 2020-07-27 MED ORDER — LACTATED RINGERS IV SOLN: INTRAVENOUS | Status: DC

## 2020-07-27 NOTE — Progress Notes (Signed)
PROGRESS NOTE  Wanda [name]  DOB: 1961/09/07  PCP: Wanda Dy, MD HER:[id number]  DOA: 07/16/2020  LOS: 19 days   Chief Complaint  Patient presents with  . Wound Dehiscence    Brief narrative: Wanda [name] is a 59 y.o. female with PMH of kidney transplant 2020 with CKD 4T Tac/Pred, chronic anemia, gastric sleeve, GERD, hypoalbuminemia, DM2, HTN, PAD, hypothyroidism and chronic neuropathy. Patient presented to the ED on 07/05/2020 with wound dehiscence of recent right BKA stump. Patient recently had a prolonged hospitalization 05/01/2020-06/01/2020 in septic shock secondary to a R foot ulcer with Klebsiella bacteremia complicated by endocarditis.  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  Per ID recommendation, patient completed 6 weeks of IV meropenem on 9/25.  She returned to the ED 10/5 with a 1 week history of drainage and dehiscence of the stump site.  In the ED the patient was found to have an elevated WBC.   X-ray right tibia/fibula was without evidence of osseous abnormality.   She was found to be hyperkalemic at 6.0 and in acute renal failure with creatinine 3.4 up from baseline of 2.4.   She was admitted to hospitalist service. 10/12, she underwent wound revision in the OR.  Her hospitalization was prolonged because of progressive renal failure hypotension and metabolic abnormalities.  Subjective: Patient was seen and examined this morning.   Lying in bed. Chronically sick looking. Has minimal dark urine output in the canister.  She is not sure if he had bowel movement yesterday  Assessment/Plan: R BKA stump site dehiscence/wound infection -X-ray without evidence to suggest osteomyelitis. Blood cultures without any growth so far. -10/12, Ortho performed irrigation and excisional debridement of right leg wound w/ intermediate closure. -Wound cultures grew out Klebsiella. Based on ID recommendations patient completed a course of Cipro and Flagyl on 10/24. -She is  currently in nonweightbearing status on the right lower extremity.  Recent history of endocarditis  -Patient recently completed 6 weeks course of IV meropenem on 9/25.  Repeat blood culture on this admission was negative.  -Repeat echocardiogram on this admission on 10/14 showed an echogenic mobile mass on the mitral valve.  Per cardiology, this is not a vegetation and likely likely represents a segment of redundant mitral subvalvular patterns.  TEE was not recommended.  Acute kidney injury h/o kidney transplant 2020 with CKD4 on transplanted kidney -Baseline creatinine 2.1-2.4 -Creatinine gradually trended up and is fluctuating, it has worsened in the last 24 hours to 3.84 today.   -Continue IV fluid per nephrology.  I resumed LR at 75 mL/h. -Continue tacrolimus and prednisone as before. 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 -Continue sodium bicarb 1300 mg 3 times daily.  Hyperkalemia -Gradually improving.  In normal range today. -Continue twice daily Lokelma, restrict potassium in diet. Recent Labs  Lab 07/23/20 0153 07/24/20 0029 07/25/20 0137 07/26/20 0100 07/27/20 0329  K 4.2 4.2 4.0 4.2 4.6   Hypotension -Secondary to volume depletion. -With IV resuscitation, blood pressure is gradually improving.  -Cosyntropin stimulation test shows adequate response. -On IV hydration per nephrology.  Constipation alternating with diarrhea -Reports no bowel movement for last 4 days.  Continue scheduled Senokot and as needed MiraLAX.    Hypothyroidism -Reported history of such but is not on thyroid hormone replacement.  -10/6, TSH elevated 24 but free  T4 was normal.  Patient was initiated on thyroid supplement which we will continue. -TSH needs to be checked in 6 to 8 weeks.  Acute urinary retention -10/19, patient was noted to have greater than 400 mL of urine in her bladder overnight.  And in and out catheterization was done.   -Intake and output not recorded.  Patient has minimal dark urine in the PureWick canister  Vertigo -Continue meclizine PRN.  She will need to follow-up with ENT for her ear issues.    Recent COVID -Diagnosed with Covid 9/10 while at [facility] and quarantined for 14 days. Last day of isolation was 10/1.  -Chest x-ray 10/5 noted resolved bilateral lung opacities.  Respiratory status is stable.  Paroxysmal atrial fibrillation -Currently on amiodarone and Eliquis. Currently in sinus rhythm. -Per pharmacy, Eliquis is not recommended in patients with creatinine clearance less than 15.  We'll keep it on hold for now with a plan to resume once creatinine clearance improved to more than 15.  History of type 2 diabetes Hypoglycemic episodes -A1c 4.2 on 10/7. Not on diabetes medications at home. -Cosyntropin stimulation test results do not suggest adrenal insufficiency. -Blood sugar level low at 65 this morning.  Continue to monitor.  Currently she is not on any medicines or insulin for diabetes. Recent Labs  Lab 07/26/20 1159 07/26/20 1303 07/26/20 1625 07/26/20 2105 07/27/20 0735  GLUCAP 60* 86 87 79 65*   Chronic normocytic anemia -Chronic anemia likely due to chronic disease.  No overt bleeding noted.  Hemoglobin is stable.  Severe malnutrition in context of chronic illness Nutrition consulted. Note as below.  Stage II sacral pressure ulcer -Patient has 1cm by 1cm blister on the sacrum and a blister on left thigh and heel that's healing and scabbed over. -See wound care note.  Goals of care -Patient is pretty weak for self-care.  She doesn't want to consider SNF.  She is at high risk  of readmission.  -10/24, patient was seen by palliative care.  Patient shows DNR/DNI.  However, her husband and daughter were upset with that choice.  Patient states this morning that she wants to remain full code.  I have made the change as per her wish.  I have updated palliative care team.  Mobility: Patient has not been participating with physical therapy.  Patient prefers going home versus SNF. Code Status:   Code Status: Full Code  Nutritional status: Body mass index is 16.57 kg/m. Nutrition Problem: Severe Malnutrition Etiology: chronic illness Signs/Symptoms: energy intake < or equal to 75% for > or equal to 1 month, percent weight loss Percent weight loss: 30 % Diet Order            Diet regular Room service appropriate? No; Fluid consistency: Thin  Diet effective now                 DVT prophylaxis: SCDs Start: 07/15/2020 1530   Antimicrobials:  Completed the course of antibiotics Fluid: LR at 81 mill per hour Consultants: Nephrology, palliative care Family Communication:  None at bedside  Status is: Inpatient  Remains inpatient appropriate because of hypotension, AKI  Dispo:  Patient From: [place]  Planned Disposition: Home  Expected discharge date: 07/28/20  Medically stable for discharge: No    Infusions:  . lactated ringers  Scheduled Meds: . amiodarone  200 mg Oral Daily  . ascorbic acid  250 mg Oral BID  . cholecalciferol  1,000 Units Oral Daily  . darbepoetin (ARANESP) injection - NON-DIALYSIS  150 mcg Subcutaneous Q Mon-1800  . feeding supplement  237 mL Oral TID BM  . folic acid  1 mg Oral Daily  . hydrocortisone   Rectal TID  . levothyroxine  25 mcg Oral Q0600  . meclizine  12.5 mg Oral TID  . mirtazapine  7.5 mg Oral QHS  . multivitamin with minerals  1 tablet Oral Daily  . nortriptyline  25 mg Oral QHS  . nystatin   Topical BID  . pantoprazole  40 mg Oral Daily  . predniSONE  5 mg Oral Daily  . ramelteon  8 mg Oral  QHS  . senna-docusate  1 tablet Oral QHS  . sodium bicarbonate  1,300 mg Oral TID  . [START ON 07/28/2020] sodium zirconium cyclosilicate  10 g Oral Daily  . tacrolimus  1.5 mg Oral Q12H  . thiamine  100 mg Oral Daily  . zinc sulfate  220 mg Oral Daily    Antimicrobials: Anti-infectives (From admission, onward)   Start     Dose/Rate Route Frequency Ordered Stop   07/18/20 1100  ciprofloxacin (CIPRO) tablet 500 mg        500 mg Oral Daily 07/18/20 1005 07/26/20 0904   07/17/20 1400  metroNIDAZOLE (FLAGYL) tablet 500 mg        500 mg Oral Every 8 hours 07/17/20 1307 07/26/20 2359   07/16/20 1600  ceFEPIme (MAXIPIME) 2 g in sodium chloride 0.9 % 100 mL IVPB  Status:  Discontinued        2 g 200 mL/hr over 30 Minutes Intravenous Every 24 hours 07/16/20 1406 07/18/20 0922   07/15/20 0600  ceFAZolin (ANCEF) IVPB 2g/100 mL premix        2 g 200 mL/hr over 30 Minutes Intravenous On call to O.R. 07/11/2020 1210 07/11/2020 1335   08/02/2020 1347  vancomycin (VANCOCIN) powder  Status:  Discontinued          As needed 07/05/2020 1356 07/21/2020 1413   07/26/2020 1213  ceFAZolin (ANCEF) 2-4 GM/100ML-% IVPB       Note to Pharmacy: Henrine Screws   : cabinet override      08/01/2020 1213 07/20/2020 1344      PRN meds: acetaminophen, alum & mag hydroxide-simeth, dextrose, dicyclomine, diphenhydrAMINE, diphenhydrAMINE, naphazoline-glycerin, ondansetron **OR** ondansetron (ZOFRAN) IV, oxyCODONE, polyethylene glycol, traMADol   Objective: Vitals:   07/27/20 0519 07/27/20 0900  BP: (!) 96/35 105/70  Pulse: 89 87  Resp: 10   Temp: 98.6 F (37 C)   SpO2: 98%     Intake/Output Summary (Last 24 hours) at 07/27/2020 1125 Last data filed at 07/27/2020 0609 Gross per 24 hour  Intake 1507.75 ml  Output 25 ml  Net 1482.75 ml   Filed Weights   07/08/20 1130  Weight: 50.9 kg   Weight change:  Body mass index is 16.57 kg/m.   Physical Exam: General exam: Chronic sick looking, weak.  Not in physical  distress Skin: Still looks clinically dry.   HEENT: Atraumatic, normocephalic, supple neck, no obvious bleeding Lungs: Clear to auscultation bilaterally CVS: Regular rate and rhythm, no murmur GI/Abd soft, nontender, nondistended, bowel sound present CNS: Alert, awake, slow to respond Psychiatry: Mood appropriate Extremities: Bandaged wounds bilaterally.  Has BKA wound on the right.  Data Review: I have personally reviewed the laboratory  data and studies available.  Recent Labs  Lab 07/21/20 0559 07/23/20 0153 07/24/20 0029 07/25/20 0137 07/27/20 0329  WBC 11.8* 16.0* 13.4* 13.0* 14.1*  NEUTROABS  --  14.1* 11.1* 11.4* 12.4*  HGB 9.5* 8.2* 8.8* 8.3* 7.4*  HCT 29.2* 25.5* 28.5* 25.8* 24.0*  MCV 90.7 91.1 94.4 90.5 95.2  PLT 230 130* 124* 116* 123*   Recent Labs  Lab 07/23/20 0153 07/24/20 0029 07/25/20 0137 07/26/20 0100 07/27/20 0329  NA 142 142 141 142 143  K 4.2 4.2 4.0 4.2 4.6  CL 113* 113* 114* 112* 113*  CO2 18* 16* 18* 19* 19*  GLUCOSE 127* 117* 82 73 70  BUN 65* 69* 68* 68* 70*  CREATININE 3.75* 3.76* 3.81* 3.63* 3.84*  CALCIUM 8.1* 8.2* 8.2* 8.1* 8.1*   F/u labs ordered.  Signed, Terrilee Croak, MD [facility] 07/27/2020

## 2020-07-27 NOTE — Progress Notes (Signed)
Admit: 07/12/2020 LOS: 31  87F s/p KT 2020 with CKD4T Tac/Pred; admitted with dehisced and infected recent right BKA now on broad-spectrum antibiotics; persistent  metabolic acidosis.  Subjective:  Wanda [name] BP soft- has been meeting with palliative care-  Is DNR-  Alert and recognizes me-  Has nto touched breakfast - reports heartburn but is on protonix-  No UOP recorded-  In purewick urine very dark and she reports some pain in her vagina  10/24 0701 - 10/25 0700 In: 1507.8 [I.V.:1507.8] Out: 25 [Urine:25]  Filed Weights   07/08/20 1130  Weight: 50.9 kg    Scheduled Meds: . amiodarone  200 mg Oral Daily  . ascorbic acid  250 mg Oral BID  . cholecalciferol  1,000 Units Oral Daily  . feeding supplement  237 mL Oral TID BM  . folic acid  1 mg Oral Daily  . hydrocortisone   Rectal TID  . levothyroxine  25 mcg Oral Q0600  . meclizine  12.5 mg Oral TID  . mirtazapine  7.5 mg Oral QHS  . multivitamin with minerals  1 tablet Oral Daily  . nortriptyline  25 mg Oral QHS  . nystatin   Topical BID  . pantoprazole  40 mg Oral Daily  . predniSONE  5 mg Oral Daily  . ramelteon  8 mg Oral QHS  . senna-docusate  1 tablet Oral QHS  . sodium bicarbonate  1,300 mg Oral TID  . sodium zirconium cyclosilicate  10 g Oral BID  . tacrolimus  1.5 mg Oral Q12H  . thiamine  100 mg Oral Daily  . zinc sulfate  220 mg Oral Daily   Continuous Infusions:  PRN Meds:.acetaminophen, alum & mag hydroxide-simeth, dextrose, dicyclomine, diphenhydrAMINE, diphenhydrAMINE, naphazoline-glycerin, ondansetron **OR** ondansetron (ZOFRAN) IV, oxyCODONE, polyethylene glycol, traMADol  Current Labs: reviewed   Physical Exam:  Blood pressure 105/70, pulse 87, temperature 98.6 F (37 C), temperature source Oral, resp. rate 10, height 5\' 9"  (1.753 m), weight 50.9 kg, SpO2 98 %. GEN: chronically ill appearing, NAD ENT: temporal wasting, dry mucosal membranes EYES: EOMI CV: Regular, nl s1s2 PULM: CTAB ABD: soft, nt/nd SKIN:  poor skin turgor EXT:No sig LLE edema. Neuro: no focal deficits  A 1. CKD 4, h/o DDKT (10/05/7251, complicated by DGF): Probably at her new baseline, Continue current transplant regimen. Follows with Dr.  Posey Pronto 2. Hyperkalemia: Likely from a type IV RTA and low GFR; stable if K is less than 6, continue twice daily lokelma, restrict K in diet, see #3 3. 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 DM2 6. AFib 7. Anemia, Hb lower today, mgmt per primary service 8. HTN, BP stable 9. Constipation with n/v 10. Urinary retention 11. Diarrhea  P . Cont supportive care, continue fluids. Still poor PO intake. Cr stable today.  Given her many comorbid issues I would have to think that resumption of dialysis is not something that would be desired but this has not specifically been addressed.  No need it seems although this FTT could be interpreted as uremia maybe  . Continue serial bladder scans, if consistently retaining then would recommend a foley placement . If K stable or better can decrease lokelma from BID to daily- will do  . Tac trough drawn 10/19, result pending . Continue outpatient immunosuppression of tacrolimus and prednisone. . Re anemia- will add ESA   .  Medication Issues; o Preferred narcotic agents for pain control are hydromorphone, fentanyl, and methadone. Morphine should not be used.  o Baclofen should be avoided o Avoid oral sodium phosphate and magnesium citrate based laxatives / bowel preps    Freeland Pracht A [facility] 07/27/2020, 9:54 AM  Recent Labs  Lab 07/25/20 0137 07/26/20 0100 07/27/20 0329  NA 141 142 143  K 4.0 4.2 4.6  CL 114* 112* 113*  CO2 18* 19* 19*  GLUCOSE 82 73 70  BUN 68* 68* 70*  CREATININE 3.81* 3.63* 3.84*  CALCIUM 8.2* 8.1* 8.1*   Recent Labs  Lab 07/24/20 0029  07/25/20 0137 07/27/20 0329  WBC 13.4* 13.0* 14.1*  NEUTROABS 11.1* 11.4* 12.4*  HGB 8.8* 8.3* 7.4*  HCT 28.5* 25.8* 24.0*  MCV 94.4 90.5 95.2  PLT 124* 116* 123*

## 2020-07-27 NOTE — Plan of Care (Signed)

## 2020-07-27 NOTE — Progress Notes (Signed)
Daily Progress Note   Patient Name: Wanda [name]       Date: 07/27/2020 DOB: August 10, 1961  Age: 59 y.o. MRN#: [id number] Attending Physician: Terrilee Croak, MD Primary Care Physician: Lorene Dy, MD Admit Date: 07/15/2020  Reason for Consultation/Follow-up:   To discuss complex medical decision making related to patient's goals of care  Met with patient, and her 3 children at bedside.  Her husband joined by conference call.  Subjective: Wanda [name] gives [place] permission to meet privately to discuss her healthcare issues.  Wanda [name], Wanda [name] and I spoke in depth in the conference room.  Wanda [name] listened.  Each of the family members expressed their concerns.  They are very concerned about how to care for her when she gets home.  They are very concerned about her nutritional status.  Wanda [name] in particular seems to understand and is very concerned about her kidneys, her wounds, her nutritional status, and her overall frailty.  Other than being able to eat a few bites she is completely dependent for care.  She is unable to even roll over in bed without assistance.  Wanda [name] explains that his mother went to [name] for her kidney transplant.  She had a very long hospital stay (6 to 8 months?).  During that time she had a lot of difficulty eating and taking nutrition.  She had an NG tube in more than once for extended periods.  The family has gone to great efforts to bring her appropriate foods and foods that she loves.  They have coaxed and cheered for her to eat.  They even described a course of steroids to increase her appetite.  Despite all of these efforts I explained her albumin is now approximately 1 and I explained the significance of hypoalbuminemia.  We talked about poor wound healing,  third spacing of fluid, and her frailty.  We talked about almost no urine output today.  The kidneys simply are not making urine.  Per the nurse she straight cathed Wanda [name] and obtained only pus.  The patient's children understand that she has an extremely poor dialysis candidate and that if her kidney situation does not quickly improve she will likely pass away.  The children discussed dialysis with me.  I explained that their mother could not take outpatient dialysis as she is unable to  sit in a chair for 4 hours a day.  Wanda [name] points out that the wounds on her bottom and rectal area would make sitting for hours nearly impossible.  The children all agree that Wanda [name] would not fare well in a nursing home.  They have teamed together and made plans to take her home and provide 24-hour care in the home for her.  Wanda [name] thrives by being with her family and particularly with her grandchildren.  I expressed to them that their love and support is what has kept her alive this long.  And I commended them on their plans to take care of her.  They have already arranged for extensive home health nursing and asked many questions about wound care.  I advised the children to continue to plan and hope and pray for the best but to please do with the need to do to begin to prepare for the worst.  They understood.  Wanda [name] explained that his mother has a long-term problem with depression.  He feels that her depression greatly affects her physical health.  Wanda [name] explained that her mother is a people pleaser and will tell people what they want to hear-consequently they felt that between the depression and the people pleasing their mother gave palliative medicine a DNR yesterday.  We also talked about healthcare decision making surrogates.  The 3 adult children feel that their father is likely not the best healthcare decision making surrogate for their mother.  We agreed to ask her about Wanda [name] POA and CODE STATUS  once again.  When we reentered the room and joined Wanda [name] I asked her with a simple CODE STATUS question-if you were to get much sicker, so sick that your heart stopped and you stopped breathing would you want [place] to aggressively resuscitate you and put you on life support?  Wanda [name] quickly answered yes she would want [place] to do those things.  We talked briefly about dialysis would she want to have it again?  Did she think she was strong enough to have it again?  Wanda [name] responded no she did not want it again.  Wanda [name] then asked her mother if you were going to have dialysis or die would you have dialysis?  And her mother said yes.   Assessment: Very frail, loving 59 year old female facing severe hypoalbuminemia, kidney failure, multiple wounds with recent complex BKA dehiscence and revision.  Now oliguric with worsening renal status.   Patient Profile/HPI:  Per intake H&P --> Wanda Hakimian Carteris a 59 y.o.femalewith PMH of kidney transplant 2020 with CKD 4T Tac/Pred, chronic anemia, gastric sleeve, GERD, hypoalbuminemia, DM2, HTN, PAD, hypothyroidism and chronic neuropathy. Patient presented to the ED on10/5/2021with wound dehiscence of recent right BKA stump. Patient recently had a prolonged hospitalization 05/01/2020-06/01/2020 in septic shock secondary to a R foot ulcer with Klebsiella bacteremia complicated by endocarditis. 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 Per ID recommendation, patient completed 6 weeks of IV meropenem on 9/25. She returned to the ED 10/5 with a 1 week history of drainage and dehiscence of the stump site.   Length of Stay: 19   Vital Signs: BP (!) 100/48 (BP Location: Right Wrist)   Pulse 89   Temp 98.9 F (37.2 C) (Oral)   Resp 18   Ht '5\' 9"'  (1.753 m) Comment: Pt stated  Wt 50.9 kg   SpO2 97%   BMI 16.57 kg/m  SpO2: SpO2: 97 % O2 Device: O2 Device: Room Air O2 Flow  Rate: O2 Flow Rate (L/min): 6 L/min       Palliative Assessment/Data:  30%     Palliative Care Plan    Recommendations/Plan: Patient would like for her children to be her HC POA.  Wanda [name] will be the primary point of contact.  Please give Wanda [name] medical updates rather than patient's husband Wanda [name].  Patient remains full code  It is very important to the patient that she be allowed to be at home and see her grandchildren before she passes away.  She does not want to die in the hospital according to her children.  Children plan to take their mother home and care for her 24/7.  She will not live in a nursing facility.  Code Status:  Full code  Prognosis: Patient has a very poor prognosis of perhaps weeks to months given her very advanced frailty and renal failure.  Discharge Planning: To be determined   Care plan was discussed with patient, RN, family  Thank you for allowing the Palliative Medicine Team to assist in the care of this patient.  Total time spent: 120 minutes     Greater than 50%  of this time was spent counseling and coordinating care related to the above assessment and plan.  Florentina Jenny, PA-C Palliative Medicine  Please contact Palliative MedicineTeam phone at [phone] for questions and concerns between 7 am - 7 pm.   Please see AMION for individual provider pager numbers.

## 2020-07-27 NOTE — Progress Notes (Signed)
This chaplain responded to PMT consult to prepare or create AD.  The Pt. is sleeping at time of visit.  Chaplain will return on Tuesday.

## 2020-07-27 NOTE — Telephone Encounter (Signed)
Patient called to cancel her appointment with Colletta Maryland 10/26, as she is still hospitalized. Per Colletta Maryland, ok for patient to contact [facility] after discharge to reschedule.  She would like to follow up with patient upon discharge. RN called patient's cell and daughter [name]- voicemail full on both numbers.  RN sent [employer].  Landis Gandy, RN

## 2020-07-28 ENCOUNTER — Ambulatory Visit: Payer: Medicare HMO | Admitting: Infectious Diseases

## 2020-07-28 DIAGNOSIS — T8130XA Disruption of wound, unspecified, initial encounter: Secondary | ICD-10-CM | POA: Diagnosis not present

## 2020-07-28 LAB — CBC WITH DIFFERENTIAL/PLATELET
Abs Immature Granulocytes: 0.15 10*3/uL — ABNORMAL HIGH (ref 0.00–0.07)
Basophils Absolute: 0 10*3/uL (ref 0.0–0.1)
Basophils Relative: 0 %
Eosinophils Absolute: 0 10*3/uL (ref 0.0–0.5)
Eosinophils Relative: 0 %
HCT: 23.7 % — ABNORMAL LOW (ref 36.0–46.0)
Hemoglobin: 7.5 g/dL — ABNORMAL LOW (ref 12.0–15.0)
Immature Granulocytes: 1 %
Lymphocytes Relative: 6 %
Lymphs Abs: 0.8 10*3/uL (ref 0.7–4.0)
MCH: 29.4 pg (ref 26.0–34.0)
MCHC: 31.6 g/dL (ref 30.0–36.0)
MCV: 92.9 fL (ref 80.0–100.0)
Monocytes Absolute: 0.3 10*3/uL (ref 0.1–1.0)
Monocytes Relative: 2 %
Neutro Abs: 14 10*3/uL — ABNORMAL HIGH (ref 1.7–7.7)
Neutrophils Relative %: 91 %
Platelets: 152 10*3/uL (ref 150–400)
RBC: 2.55 MIL/uL — ABNORMAL LOW (ref 3.87–5.11)
RDW: 21.2 % — ABNORMAL HIGH (ref 11.5–15.5)
WBC: 15.3 10*3/uL — ABNORMAL HIGH (ref 4.0–10.5)
nRBC: 0 % (ref 0.0–0.2)

## 2020-07-28 LAB — URINE CULTURE

## 2020-07-28 LAB — GLUCOSE, CAPILLARY
Glucose-Capillary: 60 mg/dL — ABNORMAL LOW (ref 70–99)
Glucose-Capillary: 119 mg/dL — ABNORMAL HIGH (ref 70–99)
Glucose-Capillary: 100 mg/dL — ABNORMAL HIGH (ref 70–99)
Glucose-Capillary: 98 mg/dL (ref 70–99)
Glucose-Capillary: 121 mg/dL — ABNORMAL HIGH (ref 70–99)

## 2020-07-28 LAB — BASIC METABOLIC PANEL
Anion gap: 12 (ref 5–15)
BUN: 68 mg/dL — ABNORMAL HIGH (ref 6–20)
CO2: 18 mmol/L — ABNORMAL LOW (ref 22–32)
Calcium: 8.2 mg/dL — ABNORMAL LOW (ref 8.9–10.3)
Chloride: 112 mmol/L — ABNORMAL HIGH (ref 98–111)
Creatinine, Ser: 4.04 mg/dL — ABNORMAL HIGH (ref 0.44–1.00)
GFR, Estimated: 12 mL/min — ABNORMAL LOW (ref >60)
Glucose, Bld: 103 mg/dL — ABNORMAL HIGH (ref 70–99)
Potassium: 4.7 mmol/L (ref 3.5–5.1)
Sodium: 142 mmol/L (ref 135–145)

## 2020-07-28 LAB — MAGNESIUM: Magnesium: 2.7 mg/dL — ABNORMAL HIGH (ref 1.7–2.4)

## 2020-07-28 MED ORDER — TACROLIMUS 1 MG PO CAPS
1.0000 mg | ORAL_CAPSULE | Freq: Two times a day (BID) | ORAL | Status: DC
  Administered 2020-07-28 – 2020-07-29 (×2): 1 mg via ORAL
  Filled 2020-07-28 (×2): qty 1

## 2020-07-28 MED ORDER — LACTATED RINGERS IV SOLN: INTRAVENOUS | Status: DC

## 2020-07-28 MED ORDER — FUROSEMIDE 10 MG/ML IJ SOLN
80.0000 mg | Freq: Two times a day (BID) | INTRAMUSCULAR | Status: DC
  Administered 2020-07-28 – 2020-07-29 (×2): 80 mg via INTRAVENOUS
  Filled 2020-07-28 (×2): qty 8

## 2020-07-28 NOTE — Progress Notes (Signed)
Admit: 07/28/2020 LOS: 20  14F s/p KT 2020 with CKD4T Tac/Pred; admitted with dehisced and infected recent right BKA   Subjective:   BP soft- appreciate Florentina Jenny with palliative for her thorough review with the family with this very tough case. UOP remains down-  U/A looks like UTI  10/25 0701 - 10/26 0700 In: 598.9 [P.O.:120; I.V.:478.9] Out: 120 [Urine:120]  Filed Weights   07/08/20 1130  Weight: 50.9 kg    Scheduled Meds:  amiodarone  200 mg Oral Daily   ascorbic acid  250 mg Oral BID   cholecalciferol  1,000 Units Oral Daily   darbepoetin (ARANESP) injection - NON-DIALYSIS  150 mcg Subcutaneous Q Mon-1800   feeding supplement  237 mL Oral TID BM   folic acid  1 mg Oral Daily   hydrocortisone   Rectal TID   levothyroxine  25 mcg Oral Q0600   mirtazapine  7.5 mg Oral QHS   multivitamin with minerals  1 tablet Oral Daily   nortriptyline  25 mg Oral QHS   nystatin   Topical BID   pantoprazole  40 mg Oral Daily   predniSONE  5 mg Oral Daily   ramelteon  8 mg Oral QHS   senna-docusate  1 tablet Oral QHS   sodium bicarbonate  1,300 mg Oral TID   sodium zirconium cyclosilicate  10 g Oral Daily   tacrolimus  1.5 mg Oral Q12H   thiamine  100 mg Oral Daily   zinc sulfate  220 mg Oral Daily   Continuous Infusions:  lactated ringers 75 mL/hr at 07/27/20 1500   PRN Meds:.acetaminophen, alum & mag hydroxide-simeth, dextrose, dicyclomine, diphenhydrAMINE, diphenhydrAMINE, meclizine, naphazoline-glycerin, ondansetron **OR** ondansetron (ZOFRAN) IV, oxyCODONE, polyethylene glycol, traMADol  Current Labs: reviewed   Physical Exam:  Blood pressure (!) 90/56, pulse 86, temperature 98.3 F (36.8 C), resp. rate 19, height 5\' 9"  (1.753 m), weight 50.9 kg, SpO2 97 %. GEN: chronically ill appearing, NAD ENT: temporal wasting, dry mucosal membranes EYES: EOMI CV: Regular, nl s1s2 PULM: CTAB ABD: soft, nt/nd SKIN: poor skin turgor EXT:No sig LLE  edema. Neuro: no focal deficits  A 1. CKD 4, h/o DDKT (7/42/5956, complicated by DGF):  crt in the high 3's- felt to be her new baseline, Continue current transplant regimen. Follows with Dr.  Posey Pronto at [facility].  Now with dec UOP and likely UTI with some volume overload - awaiting labs today, have not had urgent need for HD 2. Hyperkalemia: Likely from a type IV RTA and low GFR; stable if K is less than 6, continue twice daily lokelma, restrict K in diet, see #3 3. 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 DM2 6. AFib  7. Anemia  8. Constipation with n/v 9. Urinary retention 10. Diarrhea  P  Cont supportive care. Still poor PO intake.  labs pending for today.  Given her many comorbid issues I agree that dialysis is not something that would be desired - she likely would not be able to sit up for OP HD making it more of an LTAC scenario.  In addition, I am not sure her BP would handle and dialysis often brings up other issues that require hospitalization - the risk benefit ratio of dialysis does not come out in favor of much benefit. Daughter was on the phone when I saw Ms. Mcghie today-  She said they definitely want to  try dialysis so that she can continue to live and hopefully improve.  Patient says that she wants dialysis as well.  There have been no indications yet but suspect with no UOP numbers will worsen.  The inpatient dialysis is not the issue - it is the outpatient that I am more concerned with    If K stable or better can decrease lokelma  to daily-   Tac trough drawn 10/19, result 9.1-  A little high-  Will dec dose some  Continue outpatient immunosuppression of tacrolimus and prednisone.  Re anemia- have added ESA    Volume overload-  Can try some IV lasix but doubt it will work.  She is third spacing.  Family seems very concerned about this fluid but no CHF and not  bothering patient   Will await urine culture to treat UTI-  Likely will be resistant organism    [place] 07/28/2020, 9:21 AM  Recent Labs  Lab 07/25/20 0137 07/26/20 0100 07/27/20 0329  NA 141 142 143  K 4.0 4.2 4.6  CL 114* 112* 113*  CO2 18* 19* 19*  GLUCOSE 82 73 70  BUN 68* 68* 70*  CREATININE 3.81* 3.63* 3.84*  CALCIUM 8.2* 8.1* 8.1*   Recent Labs  Lab 07/24/20 0029 07/25/20 0137 07/27/20 0329  WBC 13.4* 13.0* 14.1*  NEUTROABS 11.1* 11.4* 12.4*  HGB 8.8* 8.3* 7.4*  HCT 28.5* 25.8* 24.0*  MCV 94.4 90.5 95.2  PLT 124* 116* 123*

## 2020-07-28 NOTE — Progress Notes (Signed)
Physical Therapy Treatment Patient Details Name: Wanda [name] MRN: [id number] DOB: 07/12/1961 Today's Date: 07/28/2020    History of Present Illness Pt is a 59 y.o. female admitted from SNF on 07/14/2020 due to drainage and dehiscence of R BKA (s/p BKA 05/07/20). S/p R BKA I&D on 10/12. PMH includes recent COVID infection (off precautions), PAF, DM2, HTN, PVD, CKD IV s/p R kidney transplant (2013), gout.   PT Comments    PT slowly progressing with mobility; able to briefly sit EOB with maxA+2 before becoming minimally responsive requiring return to supine. BP down to 81/19, then 63/22 via L leg (both UEs restricted). RN and MD present to intervene. Further mobility deferred; pt positioned to encourage BLE neutral positioning (knee extension, hip IR/ADD). Daughter present and supportive; discussed family's plan to have pt return home - family own significant amount of DME, but will need a w/c with platform to support RLE residual limb, as well as pressure-relieving mattress for hospital bed secondary to pt's sacral wounds.   Follow Up Recommendations  Home health PT;Supervision/Assistance - 24 hour (family declined SNF)     Equipment Recommendations   (w/c with platform for RLE residual limb; pressure-relief mattress for hospital bed)    Recommendations for Other Services       Precautions / Restrictions Precautions Precautions: Fall Precaution Comments: R BKA, sacral wounds; 10/26 - near syncope sitting EOB with significant hypotension Restrictions Weight Bearing Restrictions: Yes RLE Weight Bearing: Non weight bearing    Mobility  Bed Mobility Overal bed mobility: Needs Assistance Bed Mobility: Supine to Sit;Sit to Supine     Supine to sit: Max assist;+2 for physical assistance;+2 for safety/equipment Sit to supine: Total assist;+2 for physical assistance;+2 for safety/equipment   General bed mobility comments: Poor initiation and sequencing, requiring maxA+2 for LE  management and trunk elevation; totalA to return to supine due to unresponsiveness  Transfers                 General transfer comment: Deferred secondary to hypotension/unresponsive episode while sitting EOB; will likely require +2 assist with maximove lift OOB  Ambulation/Gait                 Stairs             Wheelchair Mobility    Modified Rankin (Stroke Patients Only)       Balance Overall balance assessment: Needs assistance Sitting-balance support: Feet supported;Bilateral upper extremity supported Sitting balance-Leahy Scale: Zero Sitting balance - Comments: Significant posterior lean requiring maxA to maintain sitting balance (apparent anxiety/fearful of falling, likely also affected by sacral wound pain and symptomatic hypotension)                                    Cognition Arousal/Alertness: Awake/alert Behavior During Therapy: Flat affect Overall Cognitive Status: Impaired/Different from baseline                                 General Comments: Answering questions appropriately; anxious with mobility requiring frequent cues for attention and problem solving; becoming unresponsive once sitting EOB (orthostasis) requiring return to supine, more alert once laying down and able to answer questions      Exercises Other Exercises Other Exercises: BLEs repositioned in supine to encourage R knee extension, L knee extension, L hip IR, LUE elevation due to significant edema  General Comments General comments (skin integrity, edema, etc.): RN/NT present due to symptomatic hypotension; return to supine BP down to 81/19, taken again reading 63/22, then 75/18 (32) all via L lower leg. Daughter present and supportive to discuss return home, DME needs, assist needs      Pertinent Vitals/Pain Pain Assessment: Faces Faces Pain Scale: Hurts little more Pain Location: LUE, BLE with mobility and repositioning Pain Descriptors  / Indicators: Grimacing;Guarding;Sore;Aching;Heaviness Pain Intervention(s): Monitored during session;Repositioned    Home Living                      Prior Function            PT Goals (current goals can now be found in the care plan section) Acute Rehab PT Goals Patient Stated Goal: Return home with family support PT Goal Formulation: With family Time For Goal Achievement: 08/11/20 Potential to Achieve Goals: Fair Progress towards PT goals: Progressing toward goals (limited by hypotension in sitting)    Frequency    Min 2X/week      PT Plan Discharge plan needs to be updated    Co-evaluation PT/OT/SLP Co-Evaluation/Treatment: Yes Reason for Co-Treatment: Complexity of the patient's impairments (multi-system involvement);For patient/therapist safety;To address functional/ADL transfers PT goals addressed during session: Mobility/safety with mobility;Balance        AM-PAC PT "6 Clicks" Mobility   Outcome Measure  Help needed turning from your back to your side while in a flat bed without using bedrails?: Total Help needed moving from lying on your back to sitting on the side of a flat bed without using bedrails?: Total Help needed moving to and from a bed to a chair (including a wheelchair)?: Total Help needed standing up from a chair using your arms (e.g., wheelchair or bedside chair)?: Total Help needed to walk in hospital room?: Total Help needed climbing 3-5 steps with a railing? : Total 6 Click Score: 6    End of Session   Activity Tolerance: Treatment limited secondary to medical complications (Comment) Patient left: in bed;with call bell/phone within reach;with nursing/sitter in room;with family/visitor present Nurse Communication: Mobility status;Other (comment) (symptomatic hypotension) PT Visit Diagnosis: Muscle weakness (generalized) (M62.81);Difficulty in walking, not elsewhere classified (R26.2);Pain;Other abnormalities of gait and mobility  (R26.89)     Time: 1041-1106 PT Time Calculation (min) (ACUTE ONLY): 25 min  Charges:  $Therapeutic Activity: 8-22 mins                    [name], PT, DPT Acute Rehabilitation Services  Pager [phone] Office [phone]  Derry Lory 07/28/2020, 12:56 PM

## 2020-07-28 NOTE — Progress Notes (Signed)
Occupational Therapy Treatment Patient Details Name: Wanda [name] MRN: [id number] DOB: [birth year] Today's Date: 07/28/2020    History of present illness Pt is a 59 y.o. 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 S/p R BKA I&D on 10/12. PMH includes recent COVID infection (off precautions), PAF, DM2, HTN, PVD, CKD IV s/p R kidney transplant (2013), gout.   OT comments  Patient continues to make minimal progress towards goals in skilled OT session. Patient's session encompassed bed mobility, repositioning, and sitting EOB with PT due to increased need for assistance (pt total of 2 for previous session). Pt with popped blister on L forearm (RN notified and bandage placed). Pt brought to EOB with max A of 2, with syncopal episode after 30 seconds, and returned to supine with total A of 2 to complete. BP assessed to be 81/19 in supine, then 63/22, then 75/18 in supine and trendelenburg. RN and MD present throughout BP readings, pt repositioned and left with RN and MD present. Therapy will continue to follow.    Follow Up Recommendations  SNF    Equipment Recommendations  Other (comment);Wheelchair (measurements OT);Wheelchair cushion (measurements OT)    Recommendations for Other Services      Precautions / Restrictions Precautions Precautions: Fall Precaution Comments: R BKA, sacral wounds; 10/26 - near syncope sitting EOB with significant hypotension Restrictions Weight Bearing Restrictions: Yes RLE Weight Bearing: Non weight bearing Other Position/Activity Restrictions: NWB R LE       Mobility Bed Mobility Overal bed mobility: Needs Assistance Bed Mobility: Supine to Sit;Sit to Supine     Supine to sit: Max assist;+2 for physical assistance;+2 for safety/equipment Sit to supine: Total assist;+2 for physical assistance;+2 for safety/equipment   General bed mobility comments: Poor initiation and sequencing, requiring maxA+2  for LE management and trunk elevation; totalA to return to supine due to unresponsiveness  Transfers                 General transfer comment: Deferred secondary to hypotension/unresponsive episode while sitting EOB; will likely require +2 assist with maximove lift OOB    Balance Overall balance assessment: Needs assistance Sitting-balance support: Feet supported;Bilateral upper extremity supported Sitting balance-Leahy Scale: Zero Sitting balance - Comments: Significant posterior lean requiring maxA to maintain sitting balance (apparent anxiety/fearful of falling, likely also affected by sacral wound pain and symptomatic hypotension)                                   ADL either performed or assessed with clinical judgement   ADL                                       Functional mobility during ADLs: Total assistance;+2 for physical assistance;+2 for safety/equipment General ADL Comments: Session focus on upright EOB with therapy, limited due to significant hypotension     Vision       Perception     Praxis      Cognition Arousal/Alertness: Awake/alert Behavior During Therapy: Flat affect Overall Cognitive Status: Impaired/Different from baseline                                 General Comments: Answering questions appropriately; anxious with mobility requiring frequent  cues for attention and problem solving; becoming unresponsive once sitting EOB (orthostasis) requiring return to supine, more alert once laying down and able to answer questions        Exercises Other Exercises Other Exercises: BLEs repositioned in supine to encourage R knee extension, L knee extension, L hip IR, LUE elevation due to significant edema   Shoulder Instructions       General Comments RN/NT present due to symptomatic hypotension; return to supine BP down to 81/19, taken again reading 63/22, then 75/18 (32) all via L lower leg. Daughter  present and supportive to discuss return home, DME needs, assist needs    Pertinent Vitals/ Pain       Pain Assessment: Faces Faces Pain Scale: Hurts little more Pain Location: LUE, BLE with mobility and repositioning Pain Descriptors / Indicators: Grimacing;Guarding;Sore;Aching;Heaviness Pain Intervention(s): Monitored during session;Repositioned  Home Living                                          Prior Functioning/Environment              Frequency  Min 2X/week        Progress Toward Goals  OT Goals(current goals can now be found in the care plan section)  Progress towards OT goals: Not progressing toward goals - comment (hypotensive)  Acute Rehab OT Goals Patient Stated Goal: Return home with family support OT Goal Formulation: With patient Time For Goal Achievement: 08/05/20 Potential to Achieve Goals: [place] Discharge plan remains appropriate;Frequency remains appropriate    Co-evaluation      Reason for Co-Treatment: Complexity of the patient's impairments (multi-system involvement);For patient/therapist safety;To address functional/ADL transfers PT goals addressed during session: Mobility/safety with mobility;Balance OT goals addressed during session: ADL's and self-care      AM-PAC OT "6 Clicks" Daily Activity     Outcome Measure   Help from another person eating meals?: A Lot Help from another person taking care of personal grooming?: A Lot Help from another person toileting, which includes using toliet, bedpan, or urinal?: Total Help from another person bathing (including washing, rinsing, drying)?: A Lot Help from another person to put on and taking off regular upper body clothing?: A Lot Help from another person to put on and taking off regular lower body clothing?: Total 6 Click Score: 10    End of Session    OT Visit Diagnosis: Other abnormalities of gait and mobility (R26.89);Muscle weakness (generalized) (M62.81)    Activity Tolerance Treatment limited secondary to medical complications (Comment) (hypotension)   Patient Left in bed;with call bell/phone within reach;with nursing/sitter in room;with family/visitor present   Nurse Communication Mobility status;Other (comment) (BP levels)        Time: 1041-1106 OT Time Calculation (min): 25 min  Charges: OT General Charges $OT Visit: 1 Visit OT Treatments $Self Care/Home Management : 8-22 mins  Corinne Ports E. Sinjin Amero, COTA/L Acute Rehabilitation Services [place] 07/28/2020, 3:23 PM

## 2020-07-28 NOTE — Progress Notes (Signed)
This chaplain is present with the Pt., Pt. daughter-Carrie, notary, and two witnesses for notarizing of the Pt. Advance Directive.  The Pt. has completed AD education and decided to name Lu Paradise as her Healthcare Agent.  If the Pt. Healthcare Agent is unable or unwilling to serve as Designer, television/film set the Pt. next choice is [employer].  The Pt. was given the original AD document and two copies.  A copy of the AD was scanned to EMR.   The chaplain is available for F/U spiritual care as needed.

## 2020-07-28 NOTE — Plan of Care (Signed)

## 2020-07-28 NOTE — Progress Notes (Signed)
11pm Patient experienced syncopal episode while sitting up with PT on edge of bed. BP 60s/10s. MD rounding incidentally during episode. BP 75/18 when placed in [place]. Pt alert and verbalizing dizziness. 537mL NS bolus adminstered. BP 74/26 at 12pm after bolus. Patient has maintained alerterness. MD notified of minimal change.

## 2020-07-28 NOTE — Progress Notes (Signed)
PROGRESS NOTE  VAN EHLERT  DOB: Apr 06, 1961  PCP: Lorene Dy, MD VFI:[id number]  DOA: 07/20/2020  LOS: 20 days   Chief Complaint  Patient presents with  . Wound Dehiscence    Brief narrative: Wanda [name] is a 59 y.o. female with PMH of kidney transplant 2020 with CKD 4T Tac/Pred, chronic anemia, gastric sleeve, GERD, hypoalbuminemia, DM2, HTN, PAD, hypothyroidism and chronic neuropathy. Patient presented to the ED on 07/19/2020 with wound dehiscence of recent right BKA stump. Patient recently had a prolonged hospitalization 05/01/2020-06/01/2020 in septic shock secondary to a R foot ulcer with Klebsiella bacteremia complicated by endocarditis.  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  Per ID recommendation, patient completed 6 weeks of IV meropenem on 9/25.  She returned to the ED 10/5 with a 1 week history of drainage and dehiscence of the stump site.  In the ED the patient was found to have an elevated WBC.   X-ray right tibia/fibula was without evidence of osseous abnormality.   She was found to be hyperkalemic at 6.0 and in acute renal failure with creatinine 3.4 up from baseline of 2.4.   She was admitted to hospitalist service. 10/12, she underwent wound revision in the OR.  Her hospitalization was prolonged because of progressive renal failure hypotension and metabolic abnormalities.  She continues to have poor oral intake, hypotension, minimal urine output. Medically, patient is appropriate for hospice.  However family is struggling with the decision. Palliative care consultation was obtained. Patient remains full code at this time.  Subjective: Patient was seen and examined this morning.    Just prior to my evaluation, patient was seen by physical therapy.  We did try to set her up a little bit, she got dizzy, her blood pressure dropped down to 70s.  She was placed on Trendelenburg position.  One of her daughters was at bedside witnessing this.  Lying in bed.  Chronically sick looking. Has minimal dark urine output in the canister.  She is not sure if he had bowel movement yesterday  Assessment/Plan: R BKA stump site dehiscence/wound infection -X-ray without evidence to suggest osteomyelitis. Blood cultures without any growth so far. -10/12, Ortho performed irrigation and excisional debridement of right leg wound w/ intermediate closure. -Wound cultures grew out Klebsiella. Based on ID recommendations patient completed a course of Cipro and Flagyl on 10/24. -She is currently in nonweightbearing status on the right lower extremity.  Recent history of endocarditis  -Patient recently completed 6 weeks course of IV meropenem on 9/25.  Repeat blood culture on this admission was negative.  -Repeat echocardiogram on this admission on 10/14 showed an echogenic mobile mass on the mitral valve.  Per cardiology, this is not a vegetation and likely likely represents a segment of redundant mitral subvalvular patterns.  TEE was not recommended.  Persistent hypotension Acute kidney injury h/o kidney transplant 2020 with CKD4 on transplanted kidney -Baseline creatinine 2.1-2.4 -Creatinine gradually worsening, 4.04 today.  No urine output in last 48 hours. -Today we had a plan to try IV Lasix but her blood pressure dropped and we had to give her more fluid. -Blood pressure remains low all day today. -She was on tacrolimus and prednisone as before. 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 -Continue sodium bicarb 1300 mg 3 times daily.  Hyperkalemia -Normal exam Recent Labs  Lab 07/24/20 0029 07/25/20 0137 07/26/20 0100  07/27/20 0329 07/28/20 1211  K 4.2 4.0 4.2 4.6 4.7  MG  --   --   --   --  2.7*   Constipation alternating with diarrhea -Reports no bowel movement for last 4 days.  Continue scheduled Senokot and as needed MiraLAX.    Hypothyroidism -Reported history of such but is not on thyroid hormone replacement.  -10/6, TSH elevated 24 but free T4 was normal.  Patient was initiated on thyroid supplement which we will continue. -TSH needs to be checked in 6 to 8 weeks.  Vertigo -Continue meclizine PRN.  She will need to follow-up with ENT for her ear issues.    Recent COVID -Diagnosed with Covid 9/10 while at [facility] and quarantined for 14 days. Last day of isolation was 10/1.  -Chest x-ray 10/5 noted resolved bilateral lung opacities.  Respiratory status is stable.  Paroxysmal atrial fibrillation -Currently on amiodarone and Eliquis. Currently in sinus rhythm. -Per pharmacy, Eliquis is not recommended in patients with creatinine clearance less than 15.  We'll keep it on hold for now with a plan to resume once creatinine clearance improved to more than 15.  History of type 2 diabetes Hypoglycemic episodes -A1c 4.2 on 10/7. Not on diabetes medications at home. -Cosyntropin stimulation test results do not suggest adrenal insufficiency. -Blood sugar level low at 65 this morning.  Continue to monitor.  Currently she is not on any medicines or insulin for diabetes. Recent Labs  Lab 07/27/20 1626 07/27/20 2053 07/28/20 0724 07/28/20 0923 07/28/20 1207  GLUCAP 71 75 60* 119* 100*   Chronic normocytic anemia -Chronic anemia likely due to chronic disease.  No overt bleeding noted.  Hemoglobin is stable.  Severe malnutrition in context of chronic illness Nutrition consulted. Note as below.  Stage II sacral pressure ulcer -Patient has 1cm by 1cm blister on the sacrum and a blister on left thigh and heel that's healing and scabbed over. -See wound care note.  Goals of  care -This morning I spoke to patient daughter at bedside.  This afternoon I called patient's son [name] on the phone.  I stated clearly and repeatedly that patient is very sick and is at high risk of death.  Unfortunately, there is no medical intervention that would reverse the course of her illness or will stabilize it. I would really advocate for DNR/DNI status but the family is not into that decision yet. -Continue to follow.  Mobility: Blood pressure drop even on trying to sit up on bed. Code Status:   Code Status: Full Code  Nutritional status: Body mass index is 16.57 kg/m. Nutrition Problem: Severe Malnutrition Etiology: chronic illness Signs/Symptoms: energy intake < or equal to 75% for > or equal to 1 month, percent weight loss Percent weight loss: 30 % Diet Order            Diet regular Room service appropriate? No; Fluid consistency: Thin  Diet effective now                 DVT prophylaxis: SCDs Start: 07/28/2020 1530   Antimicrobials:  Completed the course of antibiotics Fluid: IV boluses given today. Consultants: Nephrology, palliative care Family Communication:  None at bedside  Status is: Inpatient  Remains inpatient appropriate because of hypotension, AKI  Dispo:  Patient From: [place]  Planned Disposition: Home  Expected discharge date: 07/30/20  Medically stable for discharge: No    Infusions:    Scheduled Meds: . amiodarone  200 mg Oral Daily  . ascorbic acid  250 mg Oral BID  . cholecalciferol  1,000 Units Oral Daily  . darbepoetin (ARANESP) injection - NON-DIALYSIS  150 mcg Subcutaneous Q Mon-1800  . feeding supplement  237 mL Oral TID BM  . folic acid  1 mg Oral Daily  . furosemide  80 mg Intravenous Q12H  . hydrocortisone   Rectal TID  . levothyroxine  25 mcg Oral Q0600  . mirtazapine  7.5 mg Oral QHS  . multivitamin with minerals  1 tablet Oral Daily  . nortriptyline  25 mg Oral QHS  . nystatin   Topical BID  .  pantoprazole  40 mg Oral Daily  . predniSONE  5 mg Oral Daily  . ramelteon  8 mg Oral QHS  . senna-docusate  1 tablet Oral QHS  . sodium bicarbonate  1,300 mg Oral TID  . sodium zirconium cyclosilicate  10 g Oral Daily  . tacrolimus  1 mg Oral Q12H  . thiamine  100 mg Oral Daily  . zinc sulfate  220 mg Oral Daily    Antimicrobials: Anti-infectives (From admission, onward)   Start     Dose/Rate Route Frequency Ordered Stop   07/18/20 1100  ciprofloxacin (CIPRO) tablet 500 mg        500 mg Oral Daily 07/18/20 1005 07/26/20 0904   07/17/20 1400  metroNIDAZOLE (FLAGYL) tablet 500 mg        500 mg Oral Every 8 hours 07/17/20 1307 07/26/20 2359   07/16/20 1600  ceFEPIme (MAXIPIME) 2 g in sodium chloride 0.9 % 100 mL IVPB  Status:  Discontinued        2 g 200 mL/hr over 30 Minutes Intravenous Every 24 hours 07/16/20 1406 07/18/20 0922   07/15/20 0600  ceFAZolin (ANCEF) IVPB 2g/100 mL premix        2 g 200 mL/hr over 30 Minutes Intravenous On call to O.R. 07/15/2020 1210 07/08/2020 1335   07/05/2020 1347  vancomycin (VANCOCIN) powder  Status:  Discontinued          As needed 07/28/2020 1356 07/04/2020 1413   07/13/2020 1213  ceFAZolin (ANCEF) 2-4 GM/100ML-% IVPB       Note to Pharmacy: Henrine Screws   : cabinet override      07/08/2020 1213 07/23/2020 1344      PRN meds: acetaminophen, alum & mag hydroxide-simeth, dextrose, dicyclomine, diphenhydrAMINE, diphenhydrAMINE, meclizine, naphazoline-glycerin, ondansetron **OR** ondansetron (ZOFRAN) IV, oxyCODONE, polyethylene glycol, traMADol   Objective: Vitals:   07/28/20 1100 07/28/20 1200  BP: (!) 75/18 (!) 74/26  Pulse:    Resp:    Temp:    SpO2:      Intake/Output Summary (Last 24 hours) at 07/28/2020 1616 Last data filed at 07/28/2020 0400 Gross per 24 hour  Intake 460.15 ml  Output 0 ml  Net 460.15 ml   Filed Weights   07/08/20 1130  Weight: 50.9 kg   Weight change:  Body mass index is 16.57 kg/m.   Physical Exam: General exam:  Chronic sick looking, weak.  She was on Trendelenburg position this morning. Skin: Still looks clinically dry.   HEENT: Atraumatic, normocephalic, supple neck, no obvious bleeding Lungs: Clear to auscultation bilaterally CVS: Regular rate and rhythm, no murmur GI/Abd soft, nontender, nondistended, bowel sound present CNS:  Drowsy, weak Psychiatry: Depressed look Extremities: Bandaged wounds bilaterally.  Has BKA wound on the right.  Data Review: I have personally reviewed the laboratory data and studies available.  Recent Labs  Lab 07/23/20 0153 07/24/20 0029 07/25/20 0137 07/27/20 0329 07/28/20 1211  WBC 16.0* 13.4* 13.0* 14.1* 15.3*  NEUTROABS 14.1* 11.1* 11.4* 12.4* 14.0*  HGB 8.2* 8.8* 8.3* 7.4* 7.5*  HCT 25.5* 28.5* 25.8* 24.0* 23.7*  MCV 91.1 94.4 90.5 95.2 92.9  PLT 130* 124* 116* 123* 152   Recent Labs  Lab 07/24/20 0029 07/25/20 0137 07/26/20 0100 07/27/20 0329 07/28/20 1211  NA 142 141 142 143 142  K 4.2 4.0 4.2 4.6 4.7  CL 113* 114* 112* 113* 112*  CO2 16* 18* 19* 19* 18*  GLUCOSE 117* 82 73 70 103*  BUN 69* 68* 68* 70* 68*  CREATININE 3.76* 3.81* 3.63* 3.84* 4.04*  CALCIUM 8.2* 8.2* 8.1* 8.1* 8.2*  MG  --   --   --   --  2.7*   F/u labs ordered.  Signed, Terrilee Croak, MD [facility] 07/28/2020

## 2020-07-29 DIAGNOSIS — R579 Shock, unspecified: Secondary | ICD-10-CM

## 2020-07-29 DIAGNOSIS — N179 Acute kidney failure, unspecified: Secondary | ICD-10-CM | POA: Diagnosis not present

## 2020-07-29 DIAGNOSIS — I959 Hypotension, unspecified: Secondary | ICD-10-CM | POA: Diagnosis not present

## 2020-07-29 DIAGNOSIS — R627 Adult failure to thrive: Secondary | ICD-10-CM

## 2020-07-29 DIAGNOSIS — R54 Age-related physical debility: Secondary | ICD-10-CM | POA: Diagnosis not present

## 2020-07-29 DIAGNOSIS — T8130XA Disruption of wound, unspecified, initial encounter: Secondary | ICD-10-CM | POA: Diagnosis not present

## 2020-07-29 LAB — BASIC METABOLIC PANEL
Anion gap: 10 (ref 5–15)
BUN: 71 mg/dL — ABNORMAL HIGH (ref 6–20)
CO2: 22 mmol/L (ref 22–32)
Calcium: 8.2 mg/dL — ABNORMAL LOW (ref 8.9–10.3)
Chloride: 111 mmol/L (ref 98–111)
Creatinine, Ser: 4 mg/dL — ABNORMAL HIGH (ref 0.44–1.00)
GFR, Estimated: 12 mL/min — ABNORMAL LOW (ref >60)
Glucose, Bld: 122 mg/dL — ABNORMAL HIGH (ref 70–99)
Potassium: 4.1 mmol/L (ref 3.5–5.1)
Sodium: 143 mmol/L (ref 135–145)

## 2020-07-29 LAB — CBC WITH DIFFERENTIAL/PLATELET
Abs Immature Granulocytes: 0.14 10*3/uL — ABNORMAL HIGH (ref 0.00–0.07)
Basophils Absolute: 0 10*3/uL (ref 0.0–0.1)
Basophils Relative: 0 %
Eosinophils Absolute: 0 10*3/uL (ref 0.0–0.5)
Eosinophils Relative: 0 %
HCT: 22.2 % — ABNORMAL LOW (ref 36.0–46.0)
Hemoglobin: 7.2 g/dL — ABNORMAL LOW (ref 12.0–15.0)
Immature Granulocytes: 1 %
Lymphocytes Relative: 9 %
Lymphs Abs: 1.2 10*3/uL (ref 0.7–4.0)
MCH: 29.5 pg (ref 26.0–34.0)
MCHC: 32.4 g/dL (ref 30.0–36.0)
MCV: 91 fL (ref 80.0–100.0)
Monocytes Absolute: 0.5 10*3/uL (ref 0.1–1.0)
Monocytes Relative: 4 %
Neutro Abs: 11.8 10*3/uL — ABNORMAL HIGH (ref 1.7–7.7)
Neutrophils Relative %: 86 %
Platelets: 161 10*3/uL (ref 150–400)
RBC: 2.44 MIL/uL — ABNORMAL LOW (ref 3.87–5.11)
RDW: 21.2 % — ABNORMAL HIGH (ref 11.5–15.5)
WBC: 13.6 10*3/uL — ABNORMAL HIGH (ref 4.0–10.5)
nRBC: 0 % (ref 0.0–0.2)

## 2020-07-29 LAB — GLUCOSE, CAPILLARY: Glucose-Capillary: 105 mg/dL — ABNORMAL HIGH (ref 70–99)

## 2020-07-29 LAB — PHOSPHORUS: Phosphorus: 2.1 mg/dL — ABNORMAL LOW (ref 2.5–4.6)

## 2020-07-29 LAB — ALBUMIN: Albumin: <1 g/dL — ABNORMAL LOW (ref 3.5–5.0)

## 2020-07-29 MED ORDER — ALBUMIN HUMAN 25 % IV SOLN
125.0000 g | Freq: Once | INTRAVENOUS | Status: DC
  Filled 2020-07-29: qty 500

## 2020-07-29 MED ORDER — SODIUM CHLORIDE 0.9 % IV BOLUS
500.0000 mL | Freq: Once | INTRAVENOUS | Status: AC
  Administered 2020-07-29: 500 mL via INTRAVENOUS

## 2020-07-29 MED ORDER — MIDODRINE HCL 5 MG PO TABS
5.0000 mg | ORAL_TABLET | Freq: Three times a day (TID) | ORAL | Status: DC
  Administered 2020-07-29: 5 mg via ORAL
  Filled 2020-07-29: qty 1

## 2020-07-29 MED ORDER — SODIUM CHLORIDE 0.9 % IV BOLUS: 500.0000 mL | Freq: Once | INTRAVENOUS | Status: DC

## 2020-08-03 NOTE — Progress Notes (Addendum)
At 77, PCCM MD and NP had been in with pt since approx 1300 discussing plan of care with pt, son at bedside and any family members over the phone.  Plan for pt to receive albumin (see PCCM notes), pt placed on monitor, BP 53/43, HR 84, pt mentating well, able to give date of birth, stated "at [facility]", denied having any pain. Pt stating over and over "Lord you're in control".  Spoke with NP Georgann Housekeeper, asked if there was a BP goal, "highest we can get it with the albumin and midodrine".  NP had checked pt BP while in with pt (after  NS bolus)  stated SBP "readings in the 50's".  Made him aware of current BP 53/43, HR 84.   Pt made a DNR after long discussion with family at bedside and on phone.  (see PCCM note).  At 1523 While speaking with pharmacy via phone about the albumin, central monitoring called that pt had gone asystole, then HR of 30-40's, then asystole.  Upon entering room, monitor read asystole, no heart beat audible, noted some aganol breathing, no response from pt, then all breathing ceased at 1525.   Son at bedside.   Dr Pietro Cassis notified to come to pt's room and informed of pt passing.  Dr Pietro Cassis in with pt and spoke with pt's son.   Addendum by Dr. Pietro Cassis: Called by RN at 15:23 for the asystole event described as above.  Son Mr. [name] was at bedside.  I pronounced patient dead at 15:25 pm.

## 2020-08-03 NOTE — Death Summary Note (Signed)
DEATH SUMMARY   Patient Details  Name: Wanda [name] MRN: [id number] DOB: 02/01/1961  Admission/Discharge Information   Admit Date:  25-Jul-2020  Date of Death: Date of Death: 2020-08-16  Time of Death: Time of Death: 01/11/1524  Length of Stay: 01/09/2023  Referring Physician: Lorene Dy, MD   Reason(s) for Hospitalization  Inserted right leg stump wound.  Diagnoses  Preliminary cause of death:  Secondary Diagnoses (including complications and co-morbidities):  Principal Problem:   Wound dehiscence Active Problems:   Atrial fibrillation (HCC)   AKI (acute kidney injury) ([place])   Shock ([place])   Hyperkalemia   Metabolic acidosis   Klebsiella pneumoniae infection   Pseudomonas infection   History of COVID-19   COVID-19 virus infection   Hypotension   Stage II pressure ulcer ([place])   DNR (do not resuscitate)   Frailty   Hypoalbuminemia   Brief Hospital Course (including significant findings, care, treatment, and services provided and events leading to death)  BRIZA BARK is a 59 y.o. female with PMH of kidney transplant January 11, 2019 with CKD 4T Tac/Pred, chronic anemia, gastric sleeve, GERD, hypoalbuminemia, DM2, HTN, PAD, hypothyroidism and chronic neuropathy. Patient presented to the ED on 2020/07/25 with wound dehiscence of recent right BKA stump. Patient recently had a prolonged hospitalization 05/01/2020-06/01/2020 in septic shock secondary to a R foot ulcer with Klebsiella bacteremia complicated by endocarditis. 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  Per ID recommendation, patient completed 6 weeks of IV meropenem on 9/25.  She returned to the ED 10/5 with a 1 week history of drainage and dehiscence of the stump site.  In the ED the patient was found to have an elevated WBC.  X-ray right tibia/fibula was without evidence of osseous abnormality.  She was found to be hyperkalemic at 6.0 and in acute renal failure with creatinine 3.4 up from baseline of 2.4.  She was admitted to  hospitalist service. 10/12, she underwent wound revision in the OR. For the next several days, patient remained hospitalized because of progressive physical decline. She had persistent hypotension, worsening renal failure, oliguria, significant third spacing.   Multiple discussions done with family.  Palliative care consultation was obtained as well.   Today 2023-08-17, at family's request to transfer the patient, I talked to physicians at [facility] in [place].  She was not accepted there because of low blood pressure.  Critical care consultation was obtained.  After long discussion with family, family opted for DNR/DNI. Within the next hour, patient went into asystole. Patient was pronounced dead at 15:25 pm.  Cause of death:  Hypovolemic shock Acute renal failure Renal transplantation status  Pertinent Labs and Studies  Significant Diagnostic Studies DG Tibia/Fibula Right  Result Date: Jul 25, 2020 CLINICAL DATA:  Wound DKA EXAM: RIGHT TIBIA AND FIBULA - 2 VIEW COMPARISON:  05/01/2020 FINDINGS: Extensive vascular calcifications. Patient is status post below the knee amputation. Cut margins of the tibia and fibula appear smooth and without bony destructive change. Mild degenerative changes at the right knee. IMPRESSION: Status post below the knee amputation. No acute osseous abnormality. Electronically Signed   By: Donavan Foil M.D.   On: 07/25/2020 17:40   US RENAL  Result Date: 07/21/2020 CLINICAL DATA:  Initial evaluation for acute renal failure. EXAM: RENAL / URINARY TRACT ULTRASOUND COMPLETE COMPARISON:  Prior CT from 05/16/2020. FINDINGS: Right Kidney: Renal measurements: 8.1 x 3.7 x 3.2 cm = volume: 50.3 mL. Increased echogenicity within the renal parenchyma with poor corticomedullary differentiation and chronic cortical thinning. No  nephrolithiasis or hydronephrosis. 0.4 x 0.5 x 0.5 cm simple cyst present at the interpolar region. Additional 0.9 x 0.8 x 0.7 cm simple cyst present at the upper  pole. Left Kidney: Not visualized. Right lower quadrant renal transplant: Renal measurements: 11.8 x 5.2 x 5.9 cm = volume: 191.0 mL. Increased echogenicity within the renal parenchyma. No nephrolithiasis or hydronephrosis. No focal renal mass. Bladder: Prominent echogenic material seen layering within the bladder lumen, consistent with debris and/or sedimentation. Other: Ascites noted within the abdomen. Right pleural effusion partially visualized. IMPRESSION: 1. Right lower quadrant renal transplant in place. Increased echogenicity within the renal parenchyma consistent with medical renal disease. No hydronephrosis. 2. Markedly echogenic and atrophic native right kidney, with nonvisualized native left kidney. No hydronephrosis. 3. Layering echogenic material within the bladder lumen, which could reflect debris and/or sedimentation. Correlation with urinalysis recommended. 4. Ascites and right pleural effusion. 5. Two small simple right renal cysts measuring up to 9 mm as above. Electronically Signed   By: Jeannine Boga M.D.   On: 07/21/2020 16:12   DG CHEST PORT 1 VIEW  Result Date: 08/01/2020 CLINICAL DATA:  COVID infection. EXAM: PORTABLE CHEST 1 VIEW COMPARISON:  Radiograph 05/15/2020 FINDINGS: Improved lung volumes from prior exam. The heart is normal in size. Normal mediastinal contours. The previous bilateral lung opacities have resolved, likely resolved pulmonary edema. No persistent or focal consolidation. There may be trace residual small left pleural effusion, pleural effusions have otherwise resolved. No pneumothorax or evidence of pneumomediastinum. Postsurgical change in the upper abdomen. Bones diffusely under mineralized. IMPRESSION: Resolved bilateral lung opacities from prior exam, likely resolved pulmonary edema. Improved pleural effusions with trace residual on the left. No evidence of recurrent or persistent airspace disease/pneumonia. Electronically Signed   By: Keith Rake  M.D.   On: 07/03/2020 23:58   DG Abd Portable 2V  Result Date: 07/19/2020 CLINICAL DATA:  Abdominal pain and nausea EXAM: PORTABLE ABDOMEN - 2 VIEW COMPARISON:  June 03, 2015 FINDINGS: Supine and upright images obtained. Extensive postoperative changes noted throughout the abdomen and pelvis. There is diffuse stool throughout the colon. The colon is not distended with stool. There is no bowel dilatation or air-fluid level to suggest bowel obstruction. No free air. There are multiple foci of arterial vascular calcification. Visualized lung bases clear. IMPRESSION: Diffuse stool throughout colon raising question of a degree of constipation. Extensive postoperative change at multiple sites. No bowel obstruction or free air evident. Electronically Signed   By: Lowella Grip III M.D.   On: 07/19/2020 11:12   ECHOCARDIOGRAM COMPLETE  Result Date: 07/16/2020    ECHOCARDIOGRAM REPORT   Patient Name:   KAREENA ARRAMBIDE Date of Exam: 07/16/2020 Medical Rec #:  [id number]         Height:       69.0 in Accession #:    [id number]        Weight:       112.2 lb Date of Birth:  07/24/1961        BSA:          1.616 m Patient Age:    69 years          BP:           132/45 mmHg Patient Gender: F                 HR:           83 bpm. Exam Location:  Inpatient Procedure: 2D Echo, Cardiac Doppler  and Color Doppler Indications:    Endocarditis I38  History:        Patient has prior history of Echocardiogram examinations, most                 recent 05/18/2020. Signs/Symptoms:DOE; Risk Factors:Hypertension,                 Diabetes, Dyslipidemia, Non-Smoker and Sleep Apnea. PAD.  Sonographer:    Vickie Epley RDCS Referring Phys: [id number] [place]  1. Highly echogenic, mobile mass measuring 10 x 7 mm that is attached to the ventricular side of the anterior mitral valve leaflet is unchanged from the prior study on 05/18/2020 and possibly represents a partially detached calcifications.  2. Left ventricular  ejection fraction, by estimation, is 60 to 65%. The left ventricle has normal function. The left ventricle has no regional wall motion abnormalities. There is severe concentric left ventricular hypertrophy. Left ventricular diastolic  parameters are consistent with Grade I diastolic dysfunction (impaired relaxation). Elevated left atrial pressure.  3. Right ventricular systolic function is normal. The right ventricular size is normal. There is normal pulmonary artery systolic pressure.  4. Left atrial size was mildly dilated.  5. There is no evidence of cardiac tamponade. Large pleural effusion in the left lateral region.  6. The mitral valve is normal in structure. Mild to moderate mitral valve regurgitation. No evidence of mitral stenosis. Moderate mitral annular calcification.  7. Tricuspid valve regurgitation is mild to moderate.  8. The aortic valve is normal in structure. There is mild calcification of the aortic valve. There is mild thickening of the aortic valve. Aortic valve regurgitation is not visualized. No aortic stenosis is present.  9. The inferior vena cava is normal in size with greater than 50% respiratory variability, suggesting right atrial pressure of 3 mmHg. FINDINGS  Left Ventricle: Left ventricular ejection fraction, by estimation, is 60 to 65%. The left ventricle has normal function. The left ventricle has no regional wall motion abnormalities. The left ventricular internal cavity size was normal in size. There is  severe concentric left ventricular hypertrophy. Left ventricular diastolic parameters are consistent with Grade I diastolic dysfunction (impaired relaxation). Elevated left atrial pressure. Right Ventricle: The right ventricular size is normal. No increase in right ventricular wall thickness. Right ventricular systolic function is normal. There is normal pulmonary artery systolic pressure. The tricuspid regurgitant velocity is 2.58 m/s, and  with an assumed right atrial pressure  of 8 mmHg, the estimated right ventricular systolic pressure is 54.6 mmHg. Left Atrium: Left atrial size was mildly dilated. Right Atrium: Right atrial size was normal in size. Pericardium: Trivial pericardial effusion is present. There is no evidence of cardiac tamponade. Mitral Valve: The mitral valve is normal in structure. There is moderate thickening of the mitral valve leaflet(s). There is severe calcification of the mitral valve leaflet(s). Moderate mitral annular calcification. Mild to moderate mitral valve regurgitation. No evidence of mitral valve stenosis. Tricuspid Valve: The tricuspid valve is normal in structure. Tricuspid valve regurgitation is mild to moderate. No evidence of tricuspid stenosis. Aortic Valve: The aortic valve is normal in structure. There is mild calcification of the aortic valve. There is mild thickening of the aortic valve. Aortic valve regurgitation is not visualized. No aortic stenosis is present. There is no evidence of aortic valve vegetation. Pulmonic Valve: The pulmonic valve was normal in structure. Pulmonic valve regurgitation is not visualized. No evidence of pulmonic stenosis. Aorta: The aortic root is normal in  size and structure. Venous: The inferior vena cava is normal in size with greater than 50% respiratory variability, suggesting right atrial pressure of 3 mmHg. IAS/Shunts: No atrial level shunt detected by color flow Doppler. Additional Comments: There is a large pleural effusion in the left lateral region.  LEFT VENTRICLE PLAX 2D LVIDd:         4.50 cm     Diastology LVIDs:         3.30 cm     LV e' medial:    3.89 cm/s LV PW:         1.00 cm     LV E/e' medial:  28.5 LV IVS:        1.00 cm     LV e' lateral:   4.62 cm/s                            LV E/e' lateral: 24.0  LV Volumes (MOD) LV vol d, MOD A2C: 74.8 ml LV vol d, MOD A4C: 88.3 ml LV vol s, MOD A2C: 27.8 ml LV vol s, MOD A4C: 44.1 ml LV SV MOD A2C:     47.0 ml LV SV MOD A4C:     88.3 ml LV SV MOD BP:       47.1 ml RIGHT VENTRICLE RV S prime:     12.10 cm/s TAPSE (M-mode): 1.8 cm LEFT ATRIUM             Index       RIGHT ATRIUM          Index LA diam:        2.70 cm 1.67 cm/m  RA Area:     9.47 cm LA Vol (A2C):   49.0 ml 30.32 ml/m RA Volume:   18.10 ml 11.20 ml/m LA Vol (A4C):   70.3 ml 43.50 ml/m LA Biplane Vol: 59.2 ml 36.63 ml/m  AORTIC VALVE LVOT Vmax:   77.70 cm/s LVOT Vmean:  48.700 cm/s LVOT VTI:    0.162 m MITRAL VALVE                TRICUSPID VALVE MV Area (PHT): 3.17 cm     TR Peak grad:   26.6 mmHg MV Decel Time: 239 msec     TR Vmax:        258.00 cm/s MR Peak grad: 102.0 mmHg MR Vmax:      505.00 cm/s   SHUNTS MV E velocity: 111.00 cm/s  Systemic VTI: 0.16 m MV A velocity: 147.00 cm/s MV E/A ratio:  0.76 Ena Dawley MD Electronically signed by Ena Dawley MD Signature Date/Time: 07/16/2020/4:23:08 PM    Final     Microbiology Recent Results (from the past 240 hour(s))  Culture, Urine     Status: Abnormal   Collection Time: 07/21/20  5:44 AM   Specimen: Urine, Catheterized  Result Value Ref Range Status   Specimen Description URINE, RANDOM  Final   Special Requests   Final    ADDED 2029 Performed at [facility], [address] [place], [place] [address]    Culture MULTIPLE SPECIES PRESENT, SUGGEST RECOLLECTION (A)  Final   Report Status 07/23/2020 FINAL  Final  Culture, Urine     Status: Abnormal   Collection Time: 07/27/20  2:00 PM   Specimen: Urine, Catheterized  Result Value Ref Range Status   Specimen Description URINE, CATHETERIZED  Final   Special Requests   Final  NONE Performed at [facility], [place] [address], [place], [place] [address]    Culture MULTIPLE SPECIES PRESENT, SUGGEST RECOLLECTION (A)  Final   Report Status 07/28/2020 FINAL  Final    Lab Basic Metabolic Panel: Recent Labs  Lab 07/25/20 0137 07/26/20 0100 07/27/20 0329 07/28/20 1211 08/15/2020 0555  NA 141 142 143 142 143  K 4.0 4.2 4.6 4.7 4.1  CL 114* 112* 113* 112*  111  CO2 18* 19* 19* 18* 22  GLUCOSE 82 73 70 103* 122*  BUN 68* 68* 70* 68* 71*  CREATININE 3.81* 3.63* 3.84* 4.04* 4.00*  CALCIUM 8.2* 8.1* 8.1* 8.2* 8.2*  MG  --   --   --  2.7*  --   PHOS  --   --   --   --  2.1*   Liver Function Tests: Recent Labs  Lab 08-15-20 0555  ALBUMIN <1.0*   No results for input(s): LIPASE, AMYLASE in the last 168 hours. No results for input(s): AMMONIA in the last 168 hours. CBC: Recent Labs  Lab 07/24/20 0029 07/25/20 0137 07/27/20 0329 07/28/20 1211 August 15, 2020 0555  WBC 13.4* 13.0* 14.1* 15.3* 13.6*  NEUTROABS 11.1* 11.4* 12.4* 14.0* 11.8*  HGB 8.8* 8.3* 7.4* 7.5* 7.2*  HCT 28.5* 25.8* 24.0* 23.7* 22.2*  MCV 94.4 90.5 95.2 92.9 91.0  PLT 124* 116* 123* 152 161   Cardiac Enzymes: No results for input(s): CKTOTAL, CKMB, CKMBINDEX, TROPONINI in the last 168 hours. Sepsis Labs: Recent Labs  Lab 07/25/20 0137 07/27/20 0329 07/28/20 1211 2020/08/15 0555  WBC 13.0* 14.1* 15.3* 13.6*    Procedures/Operations     Khloee Garza 2020/08/15, 5:02 PM

## 2020-08-03 NOTE — Progress Notes (Signed)
PROGRESS NOTE  Wanda [name]  DOB: September 08, 1961  PCP: Lorene Dy, MD MBE:[id number]  DOA: 07/12/2020  LOS: 21 days   Chief Complaint  Patient presents with  . Wound Dehiscence    Brief narrative: Wanda [name] is a 59 y.o. female with PMH of kidney transplant 2020 with CKD 4T Tac/Pred, chronic anemia, gastric sleeve, GERD, hypoalbuminemia, DM2, HTN, PAD, hypothyroidism and chronic neuropathy. Patient presented to the ED on 07/15/2020 with wound dehiscence of recent right BKA stump. Patient recently had a prolonged hospitalization 05/01/2020-06/01/2020 in septic shock secondary to a R foot ulcer with Klebsiella bacteremia complicated by endocarditis.  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  Per ID recommendation, patient completed 6 weeks of IV meropenem on 9/25.  She returned to the ED 10/5 with a 1 week history of drainage and dehiscence of the stump site.  In the ED the patient was found to have an elevated WBC.   X-ray right tibia/fibula was without evidence of osseous abnormality.   She was found to be hyperkalemic at 6.0 and in acute renal failure with creatinine 3.4 up from baseline of 2.4.   She was admitted to hospitalist service. 10/12, she underwent wound revision in the OR.  She has progressively declined over the course of last 2 to 3 weeks.  She has persistent hypotension, worsening renal failure, oliguria, significant third spacing.   Multiple discussions done with family.  Palliative care consultation was obtained as well.  Family wants to pursue aggressive measures.    Subjective: Patient was seen and examined this morning and multiple times this afternoon. Patient's son Wanda [name] is at bedside. I talked to the other son Wanda [name] on the phone. Family is requesting to get a transferred to [facility] in [place].  I spoke with our transplant nephrologist Dr. Ursula Alert. I am waiting for callback from the on-call physician Dr. Rada Hay.  In the meantime, patient's blood pressure is down  to 60s again.  Nephrology has given her IV Lasix 80 mg this morning. With low blood pressure, I am giving her bolus again. I called for critical care consult.  Assessment/Plan: Persistent hypotension Acute kidney injury h/o kidney transplant 2020 with CKD4 on transplanted kidney -Baseline creatinine 2.1-2.4 -Creatinine gradually worsening this admission, for today.  Minimal urine output in last 72 hours. -Blood pressure down to 60s today.  IV fluids bolus given this afternoon. -Midodrine 5 mg 3 times daily. -She was on tacrolimus and prednisone as before. Recent Labs    07/20/20 0412 07/21/20 0559 07/22/20 0338 07/23/20 0153 07/24/20 0029 07/25/20 0137 07/26/20 0100 07/27/20 0329 07/28/20 1211 08-08-2020 0555  BUN 64* 67* 67* 65* 69* 68* 68* 70* 68* 71*  CREATININE 3.35* 3.59* 3.68* 3.75* 3.76* 3.81* 3.63* 3.84* 4.04* 4.00*   R BKA stump site dehiscence/wound infection -X-ray without evidence to suggest osteomyelitis. Blood cultures without any growth so far. -10/12, Ortho performed irrigation and excisional debridement of right leg wound w/ intermediate closure. -Wound cultures grew out Klebsiella. Based on ID recommendations patient completed a course of Cipro and Flagyl on 10/24. -She is currently in nonweightbearing status on the right lower extremity.  Recent history of endocarditis  -Patient recently completed 6 weeks course of IV meropenem on 9/25.  Repeat blood culture on this admission was negative.  -Repeat echocardiogram on this admission on 10/14 showed an echogenic mobile mass on the mitral valve.  Per cardiology, this is not a vegetation and likely likely represents a segment of redundant mitral subvalvular patterns.  TEE was not recommended.  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 -Continue sodium bicarb 1300 mg 3 times daily.  Hyperkalemia -Normal exam Recent Labs  Lab 07/25/20 0137 07/26/20 0100  07/27/20 0329 07/28/20 1211 05-Aug-2020 0555  K 4.0 4.2 4.6 4.7 4.1  MG  --   --   --  2.7*  --    Constipation alternating with diarrhea -Reports no bowel movement for last 4 days.  Continue scheduled Senokot and as needed MiraLAX.    Hypothyroidism -Reported history of such but is not on thyroid hormone replacement.  -10/6, TSH elevated 24 but free T4 was normal.  Patient was initiated on thyroid supplement which we will continue. -TSH needs to be checked in 6 to 8 weeks.  Vertigo -Continue meclizine PRN.  She will need to follow-up with ENT for her ear issues.    Recent COVID -Diagnosed with Covid 19 in September while at rehab and quarantined for 14 days. Last day of isolation was 10/1.  -Chest x-ray 10/5 noted resolved bilateral lung opacities.  Respiratory status is stable.  Paroxysmal atrial fibrillation -Currently on amiodarone and Eliquis. Currently in sinus rhythm. -Per pharmacy, Eliquis is not recommended in patients with creatinine clearance less than 15.  Currently Eliquis is on hold. Plan to resume once creatinine clearance improved to more than 15.  History of type 2 diabetes Hypoglycemic episodes -A1c 4.2 on 10/7. Not on diabetes medications at home. -Cosyntropin stimulation test results do not suggest adrenal insufficiency. -Blood sugar level being monitored.  Remain in 100s mostly. Recent Labs  Lab 07/28/20 0923 07/28/20 1207 07/28/20 1627 07/28/20 2027 08-05-2020 1217  GLUCAP 119* 100* 98 121* 105*   Chronic normocytic anemia -Chronic anemia likely due to chronic disease.  No overt bleeding noted.  Hemoglobin is stable.  Severe malnutrition in context of chronic illness Nutrition consulted. Note as below.  Stage II sacral pressure ulcer -Patient has 1cm by 1cm blister on the sacrum and a blister on left thigh and heel that's healing and scabbed over. -See wound care note.  Hypoalbuminemia -Albumin level less than 1 today.  For last several  months, it has remained between 1-1.5. -For oral intake.  Unclear if there will be any benefit from IV albumin infusion.  Goals of care -Remains full code.   Multiple conversations with family in last several days.  Palliative care consult obtained as well. Today, family is requesting to get a transferred to [facility] in [place].  I spoke with our transplant nephrologist Dr. Ursula Alert. I am waiting for callback from the on-call physician Dr. Rada Hay.  Mobility: Blood pressure drop even on trying to sit up on bed. Code Status:   Code Status: Full Code  Nutritional status: Body mass index is 16.57 kg/m. Nutrition Problem: Severe Malnutrition Etiology: chronic illness Signs/Symptoms: energy intake < or equal to 75% for > or equal to 1 month, percent weight loss Percent weight loss: 30 % Diet Order            Diet regular Room service appropriate? No; Fluid consistency: Thin  Diet effective now                 DVT prophylaxis: SCDs Start: 07/18/2020 1530   Antimicrobials:  Completed the course of antibiotics Fluid: IV boluses given today. Consultants: Nephrology, palliative care Family Communication:  None at bedside  Status is: Inpatient  Remains inpatient appropriate because of hypotension, AKI  Dispo:  Patient From:  Skilled Nursing Facility  Planned Disposition: Home  Expected discharge date: 07/30/20  Medically stable for discharge: No    Infusions:    Scheduled Meds: . amiodarone  200 mg Oral Daily  . ascorbic acid  250 mg Oral BID  . cholecalciferol  1,000 Units Oral Daily  . darbepoetin (ARANESP) injection - NON-DIALYSIS  150 mcg Subcutaneous Q Mon-1800  . feeding supplement  237 mL Oral TID BM  . folic acid  1 mg Oral Daily  . furosemide  80 mg Intravenous Q12H  . hydrocortisone   Rectal TID  . levothyroxine  25 mcg Oral Q0600  . midodrine  5 mg Oral TID WC  . mirtazapine  7.5 mg Oral QHS  . multivitamin with minerals  1 tablet Oral Daily  . nortriptyline  25 mg  Oral QHS  . nystatin   Topical BID  . pantoprazole  40 mg Oral Daily  . predniSONE  5 mg Oral Daily  . ramelteon  8 mg Oral QHS  . senna-docusate  1 tablet Oral QHS  . sodium bicarbonate  1,300 mg Oral TID  . tacrolimus  1 mg Oral Q12H  . thiamine  100 mg Oral Daily  . zinc sulfate  220 mg Oral Daily    Antimicrobials: Anti-infectives (From admission, onward)   Start     Dose/Rate Route Frequency Ordered Stop   07/18/20 1100  ciprofloxacin (CIPRO) tablet 500 mg        500 mg Oral Daily 07/18/20 1005 07/26/20 0904   07/17/20 1400  metroNIDAZOLE (FLAGYL) tablet 500 mg        500 mg Oral Every 8 hours 07/17/20 1307 07/26/20 2359   07/16/20 1600  ceFEPIme (MAXIPIME) 2 g in sodium chloride 0.9 % 100 mL IVPB  Status:  Discontinued        2 g 200 mL/hr over 30 Minutes Intravenous Every 24 hours 07/16/20 1406 07/18/20 0922   07/15/20 0600  ceFAZolin (ANCEF) IVPB 2g/100 mL premix        2 g 200 mL/hr over 30 Minutes Intravenous On call to O.R. 07/06/2020 1210 07/26/2020 1335   07/06/2020 1347  vancomycin (VANCOCIN) powder  Status:  Discontinued          As needed 07/19/2020 1356 07/13/2020 1413   07/21/2020 1213  ceFAZolin (ANCEF) 2-4 GM/100ML-% IVPB       Note to Pharmacy: Henrine Screws   : cabinet override      07/06/2020 1213 07/04/2020 1344      PRN meds: acetaminophen, alum & mag hydroxide-simeth, dextrose, dicyclomine, diphenhydrAMINE, diphenhydrAMINE, meclizine, naphazoline-glycerin, ondansetron **OR** ondansetron (ZOFRAN) IV, oxyCODONE, polyethylene glycol, traMADol   Objective: Vitals:   2020/08/25 1222 Aug 25, 2020 1224  BP: (!) 64/37 (!) 72/15  Pulse: 86 88  Resp: 20   Temp: 97.8 F (36.6 C)   SpO2:      Intake/Output Summary (Last 24 hours) at 08-25-20 1302 Last data filed at August 25, 2020 0500 Gross per 24 hour  Intake 866.06 ml  Output --  Net 866.06 ml   Filed Weights   07/08/20 1130  Weight: 50.9 kg   Weight change:  Body mass index is 16.57 kg/m.   Physical  Exam: General exam: Chronic sick looking, weak.  Significant third spacing noted. Skin: Dry HEENT: Atraumatic, normocephalic, supple neck, no obvious bleeding Lungs: Clear to auscultation bilaterally CVS: Regular rate and rhythm, no murmur GI/Abd soft, nontender, nondistended, bowel sound present CNS: Drowsy, weak Psychiatry: Depressed look Extremities: Bandaged wounds bilaterally.  Has  BKA wound on the right.  Data Review: I have personally reviewed the laboratory data and studies available.  Recent Labs  Lab 07/24/20 0029 07/25/20 0137 07/27/20 0329 07/28/20 1211 08-14-2020 0555  WBC 13.4* 13.0* 14.1* 15.3* 13.6*  NEUTROABS 11.1* 11.4* 12.4* 14.0* 11.8*  HGB 8.8* 8.3* 7.4* 7.5* 7.2*  HCT 28.5* 25.8* 24.0* 23.7* 22.2*  MCV 94.4 90.5 95.2 92.9 91.0  PLT 124* 116* 123* 152 161   Recent Labs  Lab 07/25/20 0137 07/26/20 0100 07/27/20 0329 07/28/20 1211 August 14, 2020 0555  NA 141 142 143 142 143  K 4.0 4.2 4.6 4.7 4.1  CL 114* 112* 113* 112* 111  CO2 18* 19* 19* 18* 22  GLUCOSE 82 73 70 103* 122*  BUN 68* 68* 70* 68* 71*  CREATININE 3.81* 3.63* 3.84* 4.04* 4.00*  CALCIUM 8.2* 8.1* 8.1* 8.2* 8.2*  MG  --   --   --  2.7*  --   PHOS  --   --   --   --  2.1*   F/u labs ordered.  Signed, Terrilee Croak, MD [facility] 2020-08-14

## 2020-08-03 NOTE — Progress Notes (Signed)
Received patient for care around 1900 from [place]. MAR, chart, and labs reviewed. Patient alert and oriented x4. Skin assessed with no new skin issues noted. Patient turned and repositioned every 2 hours and as needed with 1 assist. Patient incontinent x2. No urine output noted this shift. Bladder scan performed with 169 residual. Abdomen soft.  Patient continues to complain of RBKA pain, abdominal pain and nausea. PRN pain medication given with effectiveness.  Call light in reach, bed in low position, wheels locked. No acute events this shift. Vitals stable. Will continue to monitor for signs and symptoms of deterioration and report as needed.

## 2020-08-03 NOTE — Progress Notes (Signed)
Admit: 07/08/2020 LOS: 21  37F s/p KT 2020 with CKD4T Tac/Pred; admitted with dehisced and infected recent right BKA   Subjective:  [name] Kitchen BP soft- noted to have BP drop into the 60's upon sitting up yesterday.  No uop recorded but there is mention of incontinence-  Kidney numbers really about the same-  She is not nauseated.  I did plant the seed for the patient that I think if we did start dialysis I feel it would be unlikely that she would be able to leave a hospital situation -  Would need here or LTAC   10/26 0701 - 10/27 0700 In: 866.1 [I.V.:866.1] Out: -   Filed Weights   07/08/20 1130  Weight: 50.9 kg    Scheduled Meds: . amiodarone  200 mg Oral Daily  . ascorbic acid  250 mg Oral BID  . cholecalciferol  1,000 Units Oral Daily  . darbepoetin (ARANESP) injection - NON-DIALYSIS  150 mcg Subcutaneous Q Mon-1800  . feeding supplement  237 mL Oral TID BM  . folic acid  1 mg Oral Daily  . furosemide  80 mg Intravenous Q12H  . hydrocortisone   Rectal TID  . levothyroxine  25 mcg Oral Q0600  . mirtazapine  7.5 mg Oral QHS  . multivitamin with minerals  1 tablet Oral Daily  . nortriptyline  25 mg Oral QHS  . nystatin   Topical BID  . pantoprazole  40 mg Oral Daily  . predniSONE  5 mg Oral Daily  . ramelteon  8 mg Oral QHS  . senna-docusate  1 tablet Oral QHS  . sodium bicarbonate  1,300 mg Oral TID  . sodium zirconium cyclosilicate  10 g Oral Daily  . tacrolimus  1 mg Oral Q12H  . thiamine  100 mg Oral Daily  . zinc sulfate  220 mg Oral Daily   Continuous Infusions: . lactated ringers 100 mL/hr at 08-18-20 0638   PRN Meds:.acetaminophen, alum & mag hydroxide-simeth, dextrose, dicyclomine, diphenhydrAMINE, diphenhydrAMINE, meclizine, naphazoline-glycerin, ondansetron **OR** ondansetron (ZOFRAN) IV, oxyCODONE, polyethylene glycol, traMADol  Current Labs: reviewed   Physical Exam:  Blood pressure (!) 97/53, pulse 92, temperature 97.8 F (36.6 C), temperature source Oral, resp.  rate 20, height 5\' 9"  (1.753 m), weight 50.9 kg, SpO2 94 %. GEN: chronically ill appearing, NAD ENT: temporal wasting, dry mucosal membranes EYES: EOMI CV: Regular, nl s1s2 PULM: CTAB ABD: soft, nt/nd SKIN: poor skin turgor EXT:No sig LLE edema. Neuro: no focal deficits  A 1. CKD 4, h/o DDKT (1/61/0960, complicated by DGF):  crt in the high 3's- felt to be her new baseline. Follows with Dr.  Posey Pronto at [facility].  Now with dec UOP and likely UTI with some volume overload, have not had urgent need for HD 2. Hyperkalemia: Likely from a type IV RTA and low GFR; stable if K is less than 6, continue daily lokelma, restrict K in diet, see #3 3. 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 DM2 6. AFib  7. Anemia  8. Constipation with n/v 9. Urinary retention 10. Diarrhea  P . Cont supportive care. Still poor PO intake.  Given her many comorbid issues I agree that dialysis is not something that would be desired - she likely would not be able to sit up for OP HD making it more of an LTAC scenario.  In addition, I am not sure her BP  would handle and dialysis often brings up other issues that require hospitalization - the risk benefit ratio of dialysis does not come out in favor of much benefit. Daughter was on the phone yesterday I saw Wanda [name] today-  She said they definitely want to try dialysis so that she can continue to live and hopefully improve.  Patient says that she wants dialysis as well.  There have been no indications yet - I certainly would not do it unless our backs were up against the wall.  The inpatient dialysis is not the issue - it is the outpatient that I am more concerned with -  Doubt she would ever be stable enough to do.  I attempted to plant that seed today with Wanda [name] that if we did start HD I doubt she would EVER be able to leave the hospital except for an LTAC scenario  and I know it is important for her to go home  . IK stable- will stop lokelma . Tac trough drawn 10/19, result 9.1-  A little high-  have dec dose some . Continue outpatient immunosuppression of tacrolimus and prednisone. . Re anemia- have added ESA   . Volume overload-  Can try some IV lasix but doubt it will work.  She is third spacing.  Family seems very concerned about this fluid but no CHF and not bothering patient- stop fluids   . Will await urine culture -  Many orgs    [name] 08-24-20, 9:08 AM  Recent Labs  Lab 07/27/20 0329 07/28/20 1211 2020-08-24 0555  NA 143 142 143  K 4.6 4.7 4.1  CL 113* 112* 111  CO2 19* 18* 22  GLUCOSE 70 103* 122*  BUN 70* 68* 71*  CREATININE 3.84* 4.04* 4.00*  CALCIUM 8.1* 8.2* 8.2*  PHOS  --   --  2.1*   Recent Labs  Lab 07/27/20 0329 07/28/20 1211 Aug 24, 2020 0555  WBC 14.1* 15.3* 13.6*  NEUTROABS 12.4* 14.0* 11.8*  HGB 7.4* 7.5* 7.2*  HCT 24.0* 23.7* 22.2*  MCV 95.2 92.9 91.0  PLT 123* 152 161

## 2020-08-03 NOTE — Plan of Care (Signed)

## 2020-08-03 NOTE — Progress Notes (Signed)
Nutrition Follow-up  DOCUMENTATION CODES:   Severe malnutrition in context of chronic illness  INTERVENTION:  Magic cup TID with meals, each supplement provides 290 kcal and 9 grams of protein  Continue Ensure Enlive po TID, each supplement provides 350 kcal and 20 grams of protein  Continue MVI daily   NUTRITION DIAGNOSIS:   Severe Malnutrition related to chronic illness as evidenced by energy intake < or equal to 75% for > or equal to 1 month, percent weight loss.  ongoing  GOAL:   Patient will meet greater than or equal to 90% of their needs  Not met  MONITOR:   PO intake, Supplement acceptance, Weight trends, Labs, I & O's, Skin  REASON FOR ASSESSMENT:   Consult Assessment of nutrition requirement/status  ASSESSMENT:   Pt admitted with RLE stump site wound dehiscence. Pt's hospitalization has been prolonged because of progressive renal failure, persistent hyperkalemia, metabolic acidosis, and severe constipation. PMH includes A.fib., CKD stage IV s/p renal transplant, anemia of CKD, gastric sleeve, hypoalbuminemia, type 2 DM, HTN, hypothyroidism, GERD, peripheral artery disease, chronic neuropathy, and recent admission 7/30-8/30 for septic shock 2/2 R foot ulcer w/ Klebsiella bacteremia complicated by endocarditis. 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 Pt recently COVID-29 positive; last day of quarantine was 10/1.   10/12 s/pIrrigation and excisional debridement ofRleg wound 15 cm x 3 cm x 2 cm;Intermediate closure of Rleg wound 15 cm long with extensive undermining  Pt unavailable at time of RD visit.   Pt now with persistent hypotension, worsening creatinine and decreasing UOP -- no UOP in >48 hours. Nephrology following and states there has been no indication for dialysis yet, but they feel pt would be a poor candidate. Nephrology feels that if pt were to start dialysis, she would limit her d/c options to LTAC and pt/family have expressed a strong desire for pt to return  home. Per MD, pt is medically appropriate for hospice; however, family is struggling with the decision. Palliative Medicine is following. Pt remains full code at this time.   There has been very limited meal documentation since last RD assessment (1 meal documented as 5% completion). Per RN, pt has had very poor po intake. Pt does have orders for Ensure Enlive TID, but RN unsure if pt has been consuming these well. Per MAR, pt has been accepting the supplements. Will continue with current nutrition plan of care and monitor for supplement acceptance/po intake. If pt's po intake does not improve and if pt remains full code, may need to consider initiation of enteral nutrition to aid in meeting kcal/protein needs.   Labs: Cr 4.00 (H), Phosphorus 2.1 (L) Medications: Vitamin C, Vitamin D3, Aranesp, Folvite, Lasix, Remeron, MVI, Protonix, Deltasone, Senokot-S, Sodium bicarbonate, Thiamine, zinc sulfate  Diet Order:   Diet Order            Diet regular Room service appropriate? No; Fluid consistency: Thin  Diet effective now                 EDUCATION NEEDS:   No education needs have been identified at this time  Skin:  Skin Assessment: Skin Integrity Issues: Skin Integrity Issues:: Stage II, Unstageable, Incisions, Other (Comment) Stage II: L buttocks Unstageable: L heel Incisions: leg Other: non-pressure wound L great toe  Last BM:  10/22 type 6  Height:   Ht Readings from Last 1 Encounters:  07/10/20 _0  (1.753 m)    Weight:   Wt Readings from Last 1 Encounters:  07/08/20  50.9 kg    BMI:  Body mass index is 16.57 kg/m.  Estimated Nutritional Needs:   Kcal:  2000-2200  Protein:  100-115 grams  Fluid:  >/=2L/d    Larkin Ina, MS, RD, LDN RD pager number and weekend/on-call pager number located in [place].

## 2020-08-03 NOTE — Significant Event (Signed)
Received a call from Dr. Synetta Shadow, transplant nephrologist at [place], [place]. Patient is too unstable for transfer at this time because of persistent low blood pressure. Critical care consultation appreciated.

## 2020-08-03 NOTE — Consult Note (Addendum)
NAME:  Wanda [name], MRN:  [id number], DOB:  1960-10-21, LOS: 21 ADMISSION DATE:  07/18/2020, CONSULTATION DATE:  10/27/ REFERRING MD:  Dr. Pietro Cassis, CHIEF COMPLAINT:  shock  Brief History   59 year old female s/p renal transplant here with sepsis secondary to BKA stump dehiscence. She completed course of antibiotics on 10/24 but now 10/27 PCCM being called for shock.   History of present illness   59 year old female with PMH as below, which is significant for CKD s/p renal transplant in 2020, gastric sleeve, DM, PAD, and hypothyroid. Recently admitted for septic shock secondary to klebsiella bacteremia/infective endocarditis. 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 She presented to [facility] ED 10/5 with right BKA wound dehiscence. She was treated for cellulitis and was taken to OR for revision on 10/12.  She completed a course of antibiotics on 10/24 for klebsiella in the wound cultures. Course has since been complicated by worsening renal failure and progressive hypotension. PCCM consulted for hypotension on 10/27.   Past Medical History   has a past medical history of Anemia, Arthritis, Chronic kidney disease, stage IV (severe) ([place]), Diabetes mellitus type II, Diabetic peripheral neuropathy ([place]), Dialysis patient ([place]), Dyspnea on exertion, Foot pain, GERD (gastroesophageal reflux disease), Gout, History of chicken pox, History of measles, History of mumps, Hyperlipidemia, Hypothyroidism, Migraines, Nonspecific abnormal unspecified cardiovascular function study, Obesity, PAD (peripheral artery disease) ([place]), Peripheral neuropathy, Peripheral vascular disease ([place]), Pneumonia (09/2012), Renal transplant disorder, Shingles, Sleep apnea, and Unspecified essential hypertension.   Significant Hospital Events   10/5 admit  10/12 to OR for debridement of R BKA  10/24 palliative care consult resulting in DNR 10/25 Code status reverted  to full code by family.  10/27 PCCM consulted for hypotension.   Consults:  Nephrology Palliative Care ortho  Procedures:  10/12 > surgical debridement of R BKA  Significant Diagnostic Tests:  Echo 10/14 Grade 1 DD. EF 60%. Mobile mass on mitral valve not felt to be vegetation.   Micro Data:  10/5 blood negative 10/12 wound: klebsiella R to ampicillin, cefazolin > Enterobacter R to cefazolin > Pseudomonas pan sens.   Antimicrobials:  Meropenem, 6 week course through 9/25 Cipro, Flagyl through 10/24 course completed   Interim history/subjective:    Objective   Blood pressure (!) 72/15, pulse 88, temperature 97.8 F (36.6 C), resp. rate 20, height '5\' 9"'  (1.753 m), weight 50.9 kg, SpO2 94 %.        Intake/Output Summary (Last 24 hours) at 08/05/20 1441 Last data filed at 08/05/20 0500 Gross per 24 hour  Intake 866.06 ml  Output --  Net 866.06 ml   Filed Weights   07/08/20 1130  Weight: 50.9 kg    Examination: General: Frail elderly female HENT: [place]/AT, PERRL Lungs: Clear, accessory muscles  used Cardiovascular: RRR, no MRG Abdomen: Soft, non-distended, non-tender Extremities: diffuse anasarca in upper extremities  Neuro: Alert, somewhat confused  Resolved Hospital Problem list     Assessment & Plan:   Shock: etiology uncertain Failure to thrive CKD likely progressing, but no urgent need for HD.  Hyperkalemia Recent bacteremia Soft tissue infection of R BKA stump: ABX completed 10/24.   Discussion: I had a very long discussion in person and via phone with both sons, daughter, and husband. Dr. Tacy Learn present as well. The understand the severity of her illness. Per nephrology she will be unable to tolerate HD at any point in the  future even if she survives this admission. With SBP in the 60s and mentation deteriorating we would need to transfer to ICU for pressors, and likely intubation,  CRRT. The patient met with palliative Shavonna Corella on 10/24 and  was very clear she would not want these type of interventions, nor would she be likely to survive them. Based on our conversation with ALL FAMILY MEMBERS we will continue with conservative measures. Will try midodrine and gentle albumin to improve BP. Should consider re-starting antibiotics.   Will revert code status to DNR. Family does not want her to experience pain. If she does not improve with albumin, midodrine, and further antibiotics they understand that palliative/hospice type care is the only remaining option.   PCCM will sign off.   Labs   CBC: Recent Labs  Lab 07/24/20 0029 07/25/20 0137 07/27/20 0329 07/28/20 1211 2020/08/02 0555  WBC 13.4* 13.0* 14.1* 15.3* 13.6*  NEUTROABS 11.1* 11.4* 12.4* 14.0* 11.8*  HGB 8.8* 8.3* 7.4* 7.5* 7.2*  HCT 28.5* 25.8* 24.0* 23.7* 22.2*  MCV 94.4 90.5 95.2 92.9 91.0  PLT 124* 116* 123* 152 366    Basic Metabolic Panel: Recent Labs  Lab 07/25/20 0137 07/26/20 0100 07/27/20 0329 07/28/20 1211 08-02-20 0555  NA 141 142 143 142 143  K 4.0 4.2 4.6 4.7 4.1  CL 114* 112* 113* 112* 111  CO2 18* 19* 19* 18* 22  GLUCOSE 82 73 70 103* 122*  BUN 68* 68* 70* 68* 71*  CREATININE 3.81* 3.63* 3.84* 4.04* 4.00*  CALCIUM 8.2* 8.1* 8.1* 8.2* 8.2*  MG  --   --   --  2.7*  --   PHOS  --   --   --   --  2.1*   GFR: Estimated Creatinine Clearance: 12.2 mL/min (A) (by C-G formula based on SCr of 4 mg/dL (H)). Recent Labs  Lab 07/25/20 0137 07/27/20 0329 07/28/20 1211 2020-08-02 0555  WBC 13.0* 14.1* 15.3* 13.6*    Liver Function Tests: Recent Labs  Lab 2020/08/02 0555  ALBUMIN <1.0*   No results for input(s): LIPASE, AMYLASE in the last 168 hours. No results for input(s): AMMONIA in the last 168 hours.  ABG    Component Value Date/Time   PHART 7.390 05/07/2020 1556   PCO2ART 23.3 (L) 05/07/2020 1556   PO2ART 85 05/07/2020 1556   HCO3 14.2 (L) 05/07/2020 1556   TCO2 15 (L) 05/07/2020 1556   ACIDBASEDEF 10.0 (H) 05/07/2020 1556    O2SAT 97.0 05/07/2020 1556     Coagulation Profile: No results for input(s): INR, PROTIME in the last 168 hours.  Cardiac Enzymes: No results for input(s): CKTOTAL, CKMB, CKMBINDEX, TROPONINI in the last 168 hours.  HbA1C: Hgb A1c MFr Bld  Date/Time Value Ref Range Status  07/20/2020 04:12 AM 4.2 (L) 4.8 - 5.6 % Final    Comment:    (NOTE) Pre diabetes:          5.7%-6.4%  Diabetes:              >6.4%  Glycemic control for   <7.0% adults with diabetes   05/02/2020 01:06 AM 4.1 (L) 4.8 - 5.6 % Final    Comment:    (NOTE) Pre diabetes:          5.7%-6.4%  Diabetes:              >6.4%  Glycemic control for   <7.0% adults with diabetes     CBG: Recent Labs  Lab 07/28/20 0923 07/28/20  1207 07/28/20 1627 07/28/20 2027 Aug 14, 2020 1217  GLUCAP 119* 100* 98 121* 105*    Review of Systems:   Limited due to encephalopathy. Denies pain.   Past Medical History  She,  has a past medical history of Anemia, Arthritis, Chronic kidney disease, stage IV (severe) ([place]), Diabetes mellitus type II, Diabetic peripheral neuropathy ([place]), Dialysis patient ([place]), Dyspnea on exertion, Foot pain, GERD (gastroesophageal reflux disease), Gout, History of chicken pox, History of measles, History of mumps, Hyperlipidemia, Hypothyroidism, Migraines, Nonspecific abnormal unspecified cardiovascular function study, Obesity, PAD (peripheral artery disease) ([place]), Peripheral neuropathy, Peripheral vascular disease ([place]), Pneumonia (09/2012), Renal transplant disorder, Shingles, Sleep apnea, and Unspecified essential hypertension.   Surgical History    Past Surgical History:  Procedure Laterality Date   AMPUTATION Right 05/07/2020   Procedure: AMPUTATION BELOW KNEE;  Surgeon: [name], MD;  Location: [facility];  Service: Orthopedics;  Laterality: Right;   AMPUTATION Right 07/12/2020   Procedure: Revision right below knee amputation;  Surgeon: [name], MD;  Location: [facility];  Service:  Orthopedics;  Laterality: Right;  Revision right below knee amputation   AV FISTULA PLACEMENT Right 2000   "lower arm" (07/24/2013)   AV FISTULA PLACEMENT, RADIOCEPHALIC Right 3559   "upper arm" (07/24/2013)   CAPD REMOVAL N/A 10/10/2016   Procedure: CONTINUOUS AMBULATORY PERITONEAL DIALYSIS  (CAPD) CATHETER REMOVAL;  Surgeon: Donnie Mesa, MD;  Location: [facility];  Service: General;  Laterality: N/A;   CATARACT EXTRACTION W/ INTRAOCULAR LENS  IMPLANT, BILATERAL Bilateral 2011   [place]; [place] CRYOABLATION  1980's   INTRAUTERINE DEVICE (IUD) INSERTION  2011   LAPAROSCOPIC GASTRIC SLEEVE RESECTION N/A 05/26/2015   Procedure: LAPAROSCOPIC GASTRIC SLEEVE RESECTION;  Surgeon: Excell Seltzer, MD;  Location: [facility];  Service: General;  Laterality: N/A;   TRANSPLANTATION RENAL  2013   "right" (09/04/2012)   [place]   "RLE; stripped" (09/04/2012)   VIDEO BRONCHOSCOPY  09/07/2012   Procedure: VIDEO BRONCHOSCOPY WITHOUT FLUORO;  Surgeon: Chesley Mires, MD;  Location: [facility];  Service: Cardiopulmonary;  Laterality: Bilateral;     Social History   reports that she has never smoked. She has never used smokeless tobacco. She reports that she does not drink alcohol and does not use drugs.   Family History   Her family history includes Colon cancer in her father; Diabetes in her brother. There is no history of Neuropathy.   Allergies Allergies  Allergen Reactions   Propoxyphene N-Acetaminophen Other (See Comments)    Severe headache   Hydrocodone-Acetaminophen Other (See Comments)     Severe headache    Astemizole Rash and Other (See Comments)    headache    Cephalosporins Rash     Home Medications  Prior to Admission medications   Medication Sig Start Date End Date Taking? Authorizing Provider  amiodarone (PACERONE) 200 MG tablet Take 200 mg by mouth daily.     [provider]  apixaban (ELIQUIS) 5 MG TABS tablet Take 5 mg by mouth every 12 (twelve) hours.    [provider]  ascorbic acid (VITAMIN C) 250 MG tablet Take 1 tablet (250 mg total) by mouth 2 (two) times daily. 06/01/20   Pokhrel, Corrie Mckusick, MD  atorvastatin (LIPITOR) 20 MG tablet Take 20 mg by mouth at bedtime.     [provider]  Cholecalciferol (VITAMIN D-3) 25 MCG (1000 UT)  CAPS Take 1,000 Units by mouth daily.    [provider]  dicyclomine (BENTYL) 10 MG capsule Take 10 mg by mouth every 8 (eight) hours as needed for spasms.     [provider]  feeding supplement, GLUCERNA SHAKE, (GLUCERNA SHAKE) LIQD Take 237 mLs by mouth in the morning, at noon, and at bedtime.    [provider]  folic acid (FOLVITE) 1 MG tablet Take 1 mg by mouth daily.    [provider]  hydrocortisone (ANUSOL-HC) 25 MG suppository Place 25 mg rectally 2 (two) times daily.    [provider]  hydrOXYzine (ATARAX/VISTARIL) 10 MG tablet Take 10 mg by mouth in the morning, at noon, in the evening, and at bedtime.    [provider]  Infant Care Products (DERMACLOUD) CREA Apply 1 application topically daily as needed (Applu to buttock for irritaion).    [provider]  mirtazapine (REMERON) 7.5 MG tablet Take 1 tablet (7.5 mg total) by mouth at bedtime. 06/01/20   Pokhrel, Corrie Mckusick, MD  Multiple Vitamin (MULTIVITAMIN WITH MINERALS) TABS tablet Take 1 tablet by mouth daily.    [provider]  naphazoline-glycerin (CLEAR EYES REDNESS) 0.012-0.2 % SOLN Place 1-2 drops into the right eye 4 (four) times daily as needed for eye irritation. 06/01/20   Pokhrel, Corrie Mckusick, MD  nortriptyline (PAMELOR) 25 MG capsule TAKE 1 CAPSULE (25 MG TOTAL) BY MOUTH AT BEDTIME. 04/01/19   Patel, Domenick Bookbinder, MD  nystatin (MYCOSTATIN/NYSTOP) powder Apply 1 application topically 3 (three) times daily. Apply to groin    [provider]    ondansetron (ZOFRAN) 4 MG tablet Take 1 tablet (4 mg total) by mouth every 6 (six) hours as needed for nausea. 06/01/20   Pokhrel, Corrie Mckusick, MD  oxycodone (OXY-IR) 5 MG capsule Take 5 mg by mouth every 6 (six) hours as needed for pain.    [provider]  pantoprazole (PROTONIX) 40 MG tablet Take 40 mg by mouth daily. 01/16/18   [provider]  Patiromer Sorbitex Calcium (VELTASSA) 16.8 g PACK Take 16.8 mg by mouth daily. Mix in orange juice and drink    [provider]  predniSONE (DELTASONE) 5 MG tablet Take 1 tablet (5 mg total) by mouth daily. 06/01/20   Pokhrel, Corrie Mckusick, MD  ramelteon (ROZEREM) 8 MG tablet Take 8 mg by mouth at bedtime.     [provider]  senna-docusate (SENOKOT-S) 8.6-50 MG tablet Take 1 tablet by mouth daily. 06/02/20   Pokhrel, Corrie Mckusick, MD  sodium bicarbonate 650 MG tablet Take 1 tablet (650 mg total) by mouth 2 (two) times daily. 06/01/20   Pokhrel, Corrie Mckusick, MD  tacrolimus (PROGRAF) 0.5 MG capsule Take 1.5 mg by mouth every 12 (twelve) hours.     [provider]  thiamine 100 MG tablet Take 100 mg by mouth daily.    [provider]  zinc sulfate 220 (50 Zn) MG capsule Take 1 capsule (220 mg total) by mouth daily. 06/02/20   Flora Lipps, MD     Critical care time: 55 minutes     Georgann Housekeeper, AGACNP-BC [place] for personal pager PCCM on call pager [phone]  Aug 08, 2020 3:06 PM

## 2020-08-03 DEATH — deceased

## 2021-12-22 IMAGING — DX DG FOOT COMPLETE 3+V*R*
3 series · 3 of 3 positions shown · non-contrast
Comparison: Radiograph 02/05/2020.  Foot MRI 02/06/2020.

CLINICAL DATA: Infection. Fever. Technologist notes state: Foot
sores.

EXAM:
RIGHT FOOT COMPLETE - 3+ VIEW

[foot ap]
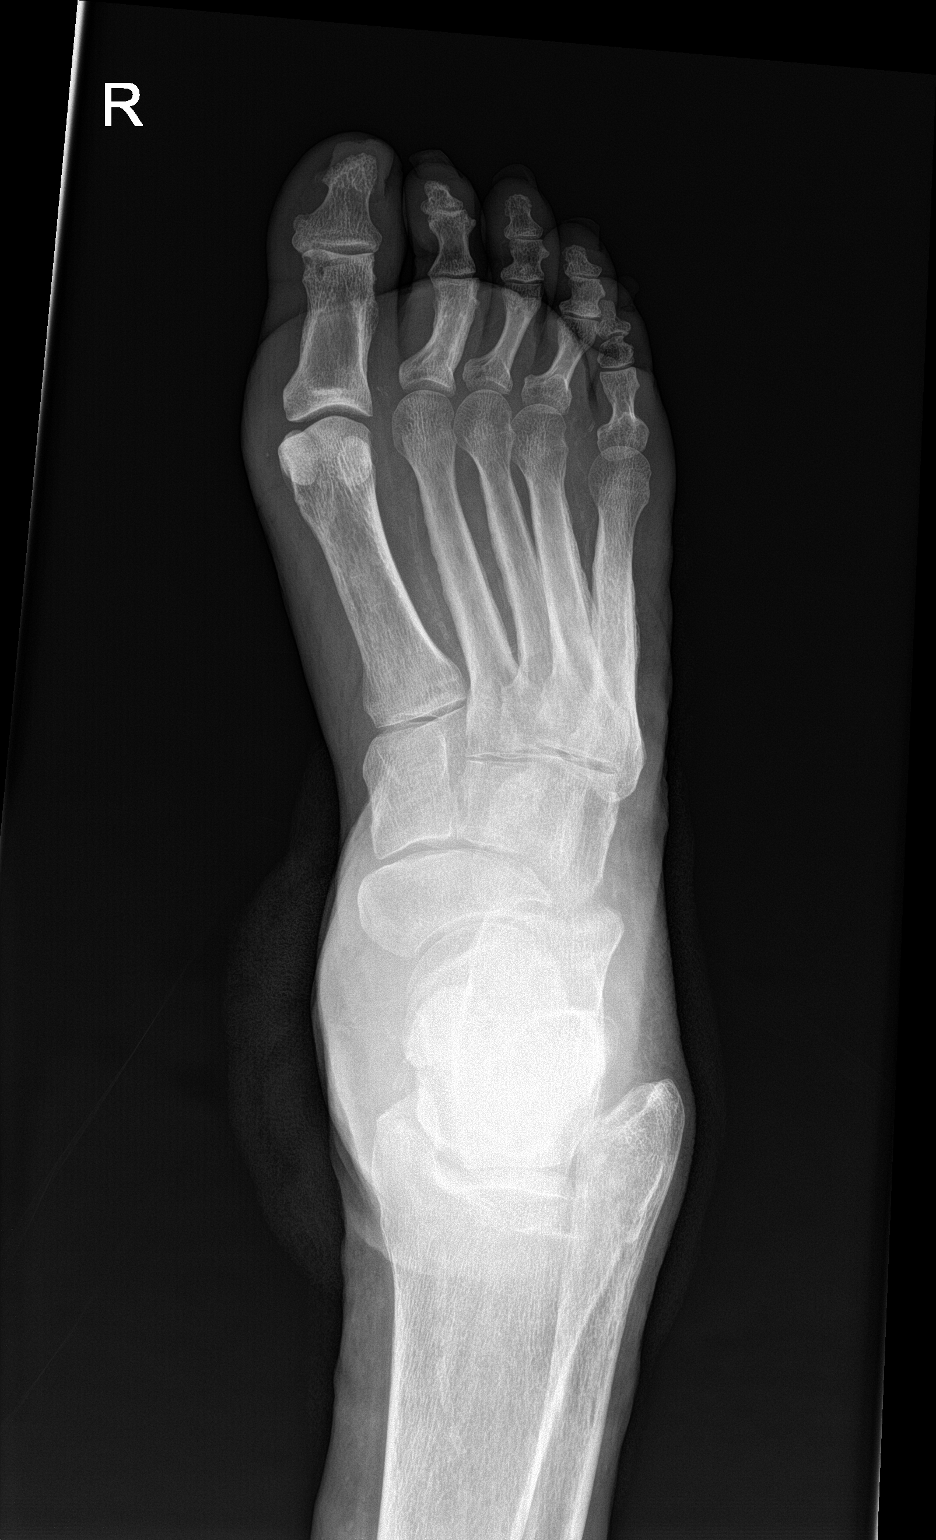

[foot obl]
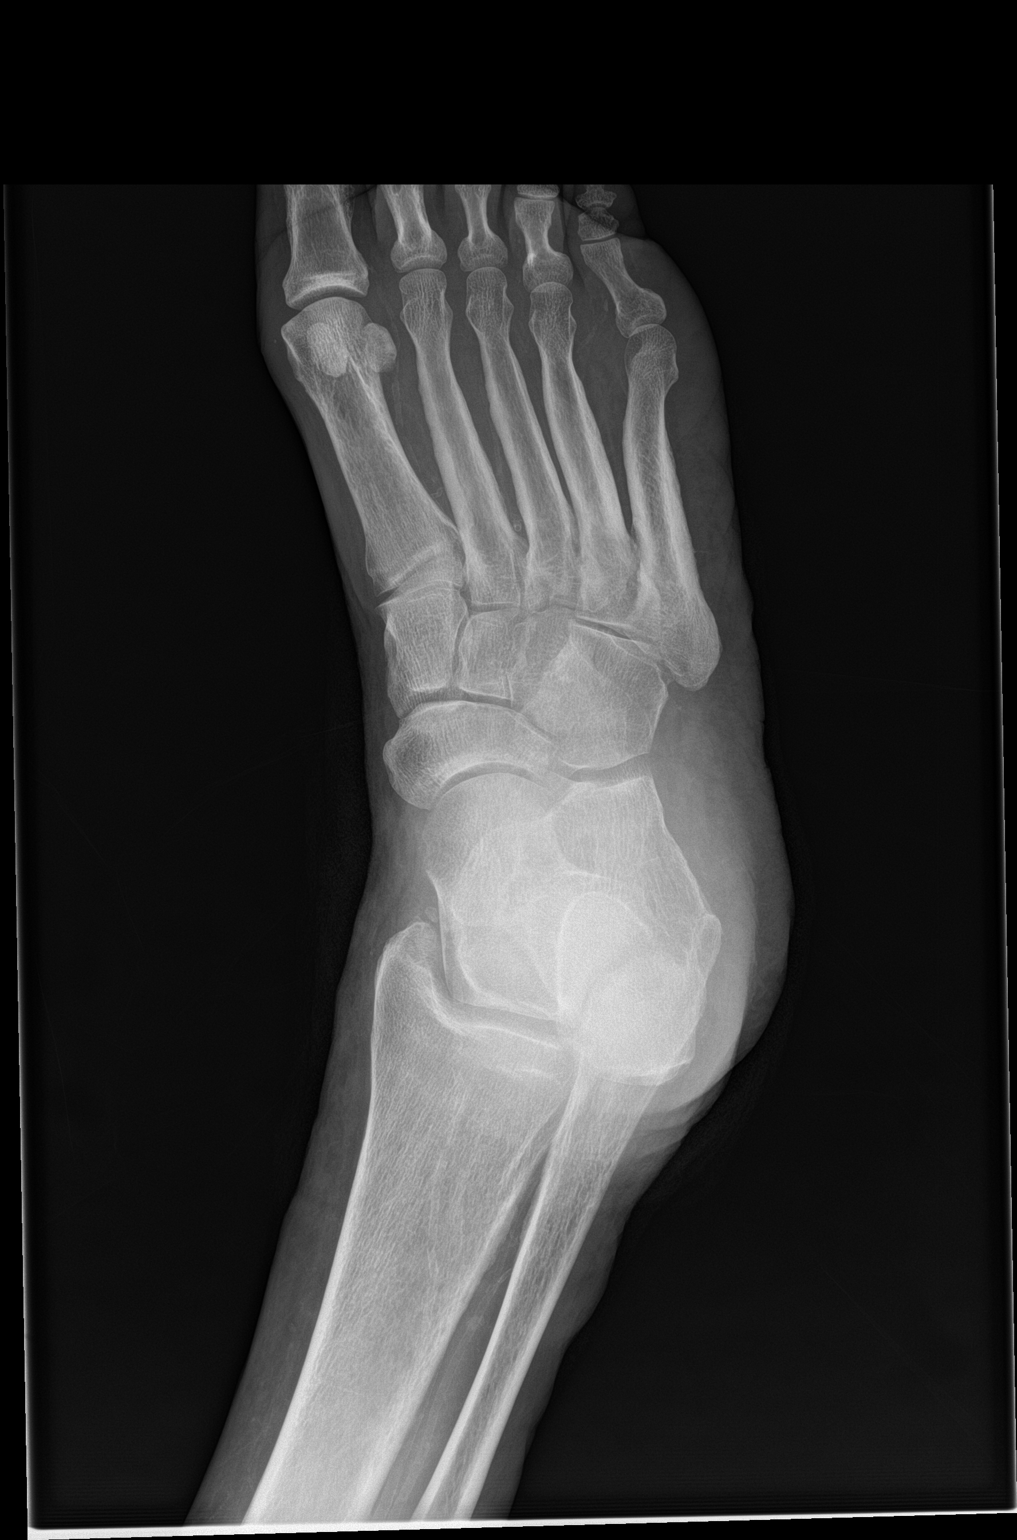

[foot lat]
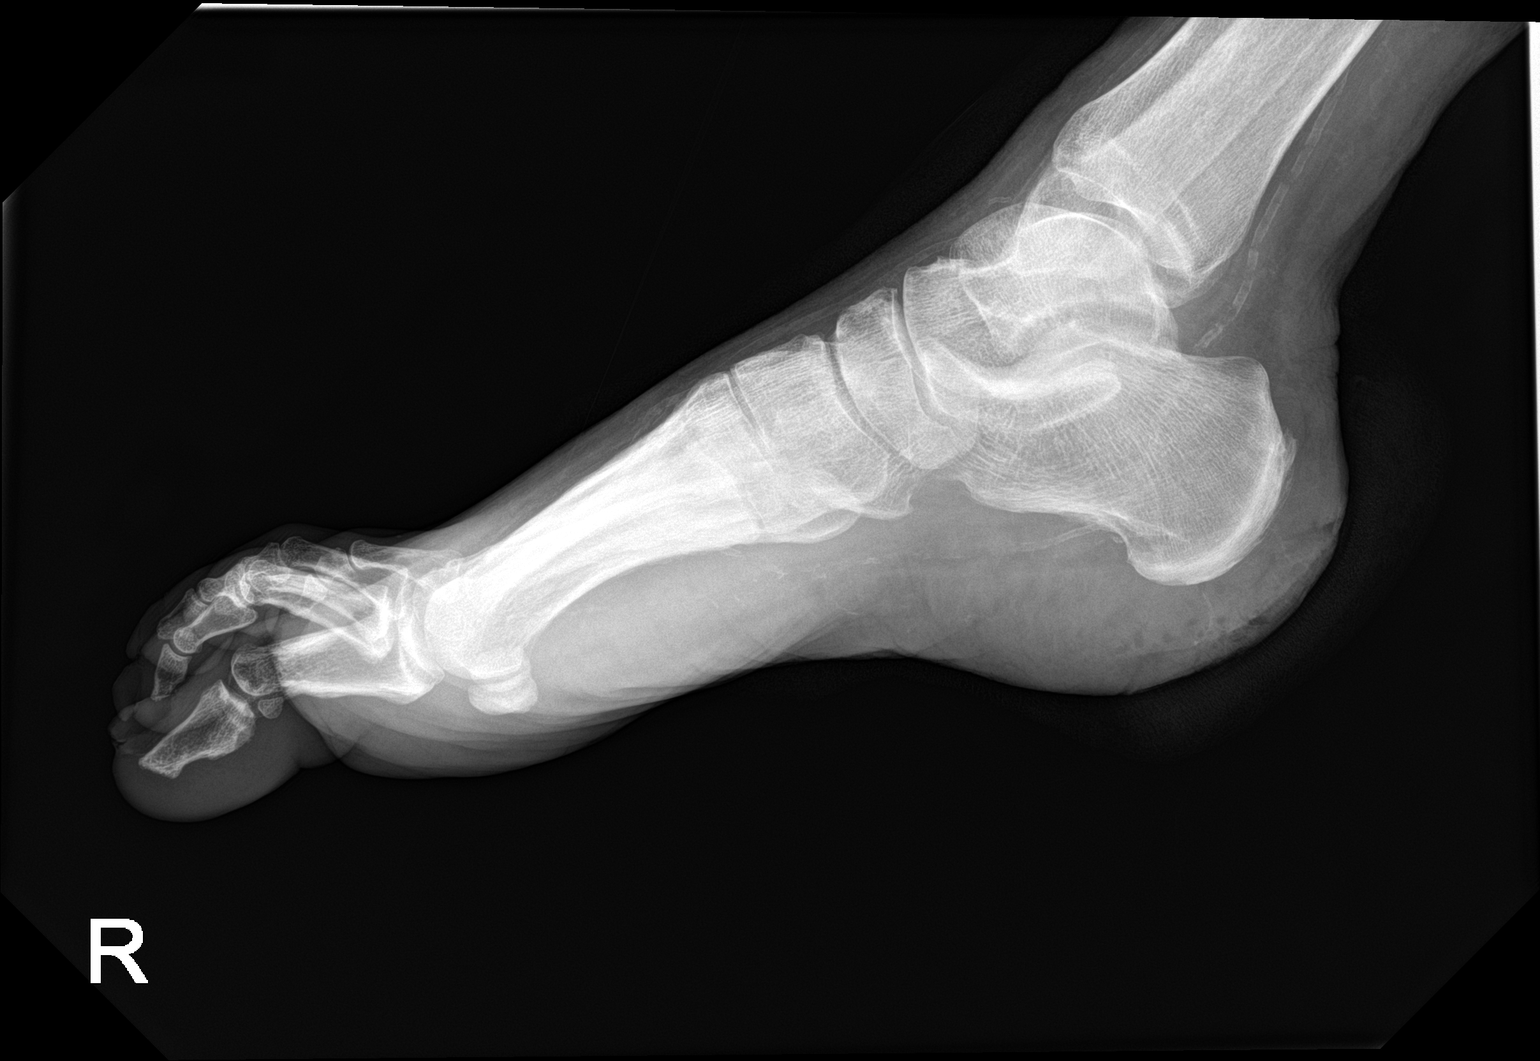

[3 of 3 positions shown; findings below may reference images not displayed]

FINDINGS: Mild hammertoe deformity of the digits. Mild hallux valgus. No
fracture. No periosteal reaction, bony destruction, or abnormal
density to suggest osteomyelitis. There is skin irregularity
involving the posterior aspect of the heel. There is a faint
curvilinear 11 mm density in the soft tissues that is seen only on
the lateral view. Mild hindfoot osteoarthritis. There are vascular
calcifications.
IMPRESSION: 1. Soft tissue irregularity involving the posterior aspect of the
heel, suspicious for ulcer. Faint curvilinear 11 mm density in the
soft tissues may represent a foreign body. This is also seen on
prior exam, however head no MRI correlate.
2. No radiographic findings of osteomyelitis.
3. Mild hammertoe deformity of the digits and hallux valgus. No
acute osseous abnormality.
